# Patient Record
Sex: Female | Born: 1980 | Race: Black or African American | Hispanic: No | Marital: Single | Smoking: Current every day smoker
Health system: Southern US, Community
[De-identification: ages and names within clinical notes are randomized; demographics above are authoritative.]

## PROBLEM LIST (undated history)

## (undated) DIAGNOSIS — K219 Gastro-esophageal reflux disease without esophagitis: Secondary | ICD-10-CM

## (undated) DIAGNOSIS — D649 Anemia, unspecified: Secondary | ICD-10-CM

## (undated) DIAGNOSIS — N83209 Unspecified ovarian cyst, unspecified side: Secondary | ICD-10-CM

## (undated) DIAGNOSIS — E119 Type 2 diabetes mellitus without complications: Secondary | ICD-10-CM

## (undated) DIAGNOSIS — G8929 Other chronic pain: Secondary | ICD-10-CM

## (undated) DIAGNOSIS — I10 Essential (primary) hypertension: Secondary | ICD-10-CM

## (undated) DIAGNOSIS — G43909 Migraine, unspecified, not intractable, without status migrainosus: Secondary | ICD-10-CM

## (undated) DIAGNOSIS — J189 Pneumonia, unspecified organism: Secondary | ICD-10-CM

## (undated) DIAGNOSIS — G629 Polyneuropathy, unspecified: Secondary | ICD-10-CM

## (undated) HISTORY — DX: Polyneuropathy, unspecified: G62.9

## (undated) HISTORY — DX: Migraine, unspecified, not intractable, without status migrainosus: G43.909

---

## 2002-03-19 HISTORY — PX: CHOLECYSTECTOMY: SHX55

## 2010-10-19 HISTORY — PX: TUBAL LIGATION: SHX77

## 2014-12-01 DIAGNOSIS — J209 Acute bronchitis, unspecified: Secondary | ICD-10-CM | POA: Insufficient documentation

## 2017-02-18 ENCOUNTER — Other Ambulatory Visit: Payer: Self-pay

## 2017-02-18 ENCOUNTER — Encounter (HOSPITAL_COMMUNITY): Payer: Self-pay | Admitting: *Deleted

## 2017-02-18 ENCOUNTER — Emergency Department (HOSPITAL_COMMUNITY)
Admission: EM | Admit: 2017-02-18 | Discharge: 2017-02-18 | Disposition: A | Discharge status: Home / Self Care | Payer: Self-pay | Attending: Emergency Medicine | Admitting: Emergency Medicine

## 2017-02-18 ENCOUNTER — Emergency Department (HOSPITAL_COMMUNITY): Payer: Self-pay

## 2017-02-18 DIAGNOSIS — B9789 Other viral agents as the cause of diseases classified elsewhere: Secondary | ICD-10-CM | POA: Insufficient documentation

## 2017-02-18 DIAGNOSIS — E119 Type 2 diabetes mellitus without complications: Secondary | ICD-10-CM | POA: Insufficient documentation

## 2017-02-18 DIAGNOSIS — R0981 Nasal congestion: Secondary | ICD-10-CM | POA: Insufficient documentation

## 2017-02-18 DIAGNOSIS — J069 Acute upper respiratory infection, unspecified: Secondary | ICD-10-CM | POA: Insufficient documentation

## 2017-02-18 DIAGNOSIS — F17210 Nicotine dependence, cigarettes, uncomplicated: Secondary | ICD-10-CM | POA: Insufficient documentation

## 2017-02-18 DIAGNOSIS — J3489 Other specified disorders of nose and nasal sinuses: Secondary | ICD-10-CM | POA: Insufficient documentation

## 2017-02-18 HISTORY — DX: Type 2 diabetes mellitus without complications: E11.9

## 2017-02-18 LAB — RAPID STREP SCREEN (MED CTR MEBANE ONLY): Streptococcus, Group A Screen (Direct): NEGATIVE

## 2017-02-18 LAB — CBG MONITORING, ED: Glucose-Capillary: 280 mg/dL — ABNORMAL HIGH (ref 65–99)

## 2017-02-18 MED ORDER — BENZONATATE 100 MG PO CAPS: 100.0000 mg | ORAL_CAPSULE | Freq: Three times a day (TID) | ORAL | 0 refills | Status: DC

## 2017-02-18 MED ORDER — FLUTICASONE PROPIONATE 50 MCG/ACT NA SUSP: 2.0000 | Freq: Every day | NASAL | 0 refills | Status: DC

## 2017-02-18 MED ORDER — CETIRIZINE HCL 10 MG PO TABS: 10.0000 mg | ORAL_TABLET | Freq: Every day | ORAL | 0 refills | Status: DC

## 2017-02-18 MED ORDER — METFORMIN HCL 1000 MG PO TABS: 1000.0000 mg | ORAL_TABLET | Freq: Two times a day (BID) | ORAL | 0 refills | Status: DC

## 2017-02-18 NOTE — ED Notes (Signed)
[facility]

## 2017-02-18 NOTE — ED Provider Notes (Signed)
[facility] Provider Note   CSN: [id number] Arrival date & time: 02/18/17  0847     History   Chief Complaint Chief Complaint  Patient presents with  . Cough    HPI Melinda Stone is a 36 y.o. female with a history of diabetes who presents to the emergency department today for cough times 1 week.  Patient states that she has 6 children at home that currently her diagnosed with viral URIs.  Over the last 1 week patient has developed a dry, nonproductive cough with associated nasal congestion, rhinorrhea, sneezing, and sore throat.  She has been taking NyQuil once in the morning and once at night for this with only mild relief of her symptoms.   No temporality of the cough.  Patient denies any fever, chills, myalgias, arthralgias, night sweats, weight loss, travel, lower leg swelling, chest pain, shortness of breath, dyspnea on exertion, sputum production, hemoptysis. No inability to control secretions, N/V, abdominal pain, voice change, dental disease, or oral trauma.  Patient is a current smoker.  No ACE inhibitor use.  No new medications.  No relation to food.  No history of asthma/COPD/CHF.  HPI  Past Medical History:  Diagnosis Date  . Diabetes mellitus without complication (HCC)     There are no active problems to display for this patient.   Past Surgical History:  Procedure Laterality Date  . CESAREAN SECTION    . CHOLECYSTECTOMY      OB History    No data available       Home Medications    Prior to Admission medications   Not on File    Family History No family history on file.  Social History Social History   Tobacco Use  . Smoking status: Current Every Day Smoker  . Smokeless tobacco: Never Used  Substance Use Topics  . Alcohol use: No    Frequency: Never  . Drug use: No     Allergies   Patient has no known allergies.   Review of Systems Review of Systems  Constitutional: Negative for chills  and fever.  HENT: Positive for congestion, rhinorrhea and sneezing. Negative for ear pain, sinus pressure and sinus pain.   Respiratory: Positive for cough. Negative for chest tightness, shortness of breath and wheezing.   Cardiovascular: Negative for chest pain and leg swelling.  Gastrointestinal: Negative for abdominal pain, diarrhea, nausea and vomiting.  Musculoskeletal: Negative for arthralgias, myalgias and neck stiffness.  Skin: Negative for rash.  Neurological: Negative for syncope, numbness and headaches.  All other systems reviewed and are negative.    Physical Exam Updated Vital Signs BP 118/72 (BP Location: Right Arm)   Pulse 88   Temp 98 F (36.7 C) (Oral)   Resp 16   Ht 5\' 9"  (1.753 m)   Wt 81.2 kg (179 lb)   LMP 02/02/2017   SpO2 100%   BMI 26.43 kg/m   Physical Exam  Constitutional: She appears well-developed and well-nourished.  HENT:  Head: Normocephalic and atraumatic.  Right Ear: Tympanic membrane, external ear and ear canal normal.  Left Ear: Tympanic membrane, external ear and ear canal normal.  Nose: Mucosal edema and rhinorrhea present. Right sinus exhibits no maxillary sinus tenderness and no frontal sinus tenderness. Left sinus exhibits no maxillary sinus tenderness and no frontal sinus tenderness.  Mouth/Throat: Uvula is midline, oropharynx is clear and moist and mucous membranes are normal. No tonsillar exudate.  The patient has normal phonation and is in  control of secretions. No stridor.  Midline uvula without edema. Soft palate rises symmetrically.  No tonsillar erythema or exudates. No PTA. Tongue protrusion is normal. No trismus. No creptius on neck palpation and patient has good dentition. No gingival erythema or fluctuance noted. Mucus membranes moist.   Eyes: Pupils are equal, round, and reactive to light. Right eye exhibits no discharge. Left eye exhibits no discharge. No scleral icterus.  Neck: Trachea normal, normal range of motion and full  passive range of motion without pain. Neck supple. No spinous process tenderness present. No neck rigidity. Normal range of motion present.  No meningismus  Cardiovascular: Normal rate, regular rhythm and intact distal pulses.  No murmur heard. Pulses:      Radial pulses are 2+ on the right side, and 2+ on the left side.       Dorsalis pedis pulses are 2+ on the right side, and 2+ on the left side.       Posterior tibial pulses are 2+ on the right side, and 2+ on the left side.  No lower extremity swelling or edema. Calves symmetric in size bilaterally.  Pulmonary/Chest: Effort normal and breath sounds normal. She exhibits no tenderness.  Abdominal: Soft. Bowel sounds are normal. There is no tenderness. There is no rebound and no guarding.  Musculoskeletal: She exhibits no edema.  Lymphadenopathy:    She has no cervical adenopathy.  Neurological: She is alert.  Speech clear. Follows commands. No facial droop. PERRLA. EOM grossly intact. CN III-XII grossly intact. Grossly moves all extremities 4 without ataxia. Able and appropriate strength for age to upper and lower extremities bilaterally including grip strength.   Skin: Skin is warm and dry. No rash noted. She is not diaphoretic.  Psychiatric: She has a normal mood and affect.  Nursing note and vitals reviewed.    ED Treatments / Results  Labs (all labs ordered are listed, but only abnormal results are displayed) Labs Reviewed  CBG MONITORING, ED - Abnormal; Notable for the following components:      Result Value   Glucose-Capillary 280 (*)    All other components within normal limits  RAPID STREP SCREEN (NOT [facility])  CULTURE, GROUP A STREP [facility]    EKG  EKG Interpretation None       Radiology Dg Chest 2 View  Result Date: 02/18/2017 CLINICAL DATA:  Cough and sore throat for 1 week. EXAM: CHEST  2 VIEW COMPARISON:  None. FINDINGS: Lungs are clear. Heart size is normal. No pneumothorax or pleural fluid. No bony  abnormality. IMPRESSION: Normal chest. Electronically Signed   By: Drusilla Kannerhomas  Dalessio M.D.   On: 02/18/2017 10:27    Procedures Procedures (including critical care time)  Medications Ordered in ED Medications - No data to display   Initial Impression / Assessment and Plan / ED Course  I have reviewed the triage vital signs and the nursing notes.  Pertinent labs & imaging results that were available during my care of the patient were reviewed by me and considered in my medical decision making (see chart for details).     Pt CXR negative for acute infiltrate. Strep test done in triage negative. No concern for PTA or RPA. Patients symptoms are consistent with URI, likely viral etiology. Discussed that antibiotics are not indicated for viral infections. Pt will be discharged with symptomatic treatment.  Verbalizes understanding and is agreeable with plan. Pt is hemodynamically stable & in NAD prior to dc.  Patient with CBG done in triage.  Noted to be 280.  Patient notes she did not take her metformin this morning as she ran out yesterday.  She does not currently have a PCP does not have any refills.  Will provide refill of this medication encourage follow-up and establishing care with new PCP.  Patient is non-ill appearing.  She has no increased work of breathing, nausea, vomiting, diarrhea, abdominal pain, or urinary changes.  No focal neurologic deficits.  She noted that she ate breakfast just prior to arriving this morning.    At time of discharge. Patient now telling nurse she has chest pain when she is coughing. Says pain occurs when she has cough and immediatly resolves. Denies DOE, SOB, chest tightness or pressure, radiation to left arm, jaw or back, nausea, or diaphoresis. EKG reassuring. No pleuritic chest pain. Patient is PERC negative. Patients vital signs are reassuring. Suspect related to URI.  Final Clinical Impressions(s) / ED Diagnoses   Final diagnoses:  Viral URI with cough     ED Discharge Orders        Ordered    metFORMIN (GLUCOPHAGE) 1000 MG tablet  2 times daily with meals     02/18/17 1207    fluticasone (FLONASE) 50 MCG/ACT nasal spray  Daily     02/18/17 1207    cetirizine (ZYRTEC) 10 MG tablet  Daily     02/18/17 1207    benzonatate (TESSALON) 100 MG capsule  Every 8 hours     02/18/17 1207       Princella Pellegrini 02/18/17 1254    Azalia Bilis, MD 02/18/17 1555

## 2017-02-18 NOTE — ED Triage Notes (Addendum)
PT states "chest cold" times 1 week.  Nonproductive cough and sore throat. VS stable.

## 2017-02-18 NOTE — ED Notes (Signed)
Edp aware patient complaining of chest pain

## 2017-02-18 NOTE — Discharge Instructions (Addendum)
Please read and follow all provided instructions.  Your diagnoses today include:  1. Viral URI with cough     Tests performed today include: Vital signs. See below for your results today.  Chest xray - negative  Strep test - negative   Medications prescribed/advised:  1. Musinex [Guaifenesin] as a decongestant [thin mucus - you have to be well hydrated when taking this for it to work] - you can find this over the counter.  2. Tylenol for fever/pain and Motrin/Ibuprofen for muscle aches 3. Flonase Steroid Nasal Spray. This does not work to maximum capability unless used daily >1-2 weeks.  4. Allegra or Zyrtec: This medication is an allergy medication that may aid in helping to relieve your symptoms. Please take daily and discuss with your PCP if you should remain on this medication at follow up. If you were prescribed a allergy medication (allegra) please take daily.  5. Cough Suppressant: Tessalon - take as directed.   Home care instructions:  An upper respiratory infection (URI) is also sometimes known as the common cold. Most people improve within 1 week, but symptoms can last up to 2 weeks. A residual cough may last even longer.   URI is most commonly caused by a virus. Viruses are NOT treated with antibiotics. You can easily spread the virus to others by oral contact. This includes kissing, sharing a glass, coughing, or sneezing. Touching your mouth or nose and then touching a surface, which is then touched by another person, can also spread the virus.   TREATMENT  Treatment is directed at relieving symptoms. There is no cure. Antibiotics are not effective, because the infection is caused by a virus, not by bacteria. Treatment may include:  Increased fluid intake. Sports drinks offer valuable electrolytes, sugars, and fluids.  Breathing heated mist or steam (vaporizer or shower).  Eating chicken soup or other clear broths, and maintaining good nutrition.  Getting plenty of rest.   Using gargles or lozenges for comfort.  Controlling fevers with ibuprofen or acetaminophen as directed by your caregiver.  Increasing usage of your inhaler if you have asthma.  Return to work when your temperature has returned to normal.   Follow-up instructions: Followup with your primary care doctor in 4 days if your symptoms persist.  Your more than welcome to return to the emergency department if symptoms worsen or become concerning.  Return instructions:  Please return to the Emergency Department if you do not get better, if you get worse, or new symptoms OR  - Fever (temperature greater than 101.9F)  - Bleeding that does not stop with holding pressure to the area    -Severe pain (please note that you may be more sore the day after your accident)  - Chest Pain  - Difficulty breathing (worsening shortness of breath with sputum production may  be a sign of pneumonia.   - Severe nausea or vomiting  - Inability to tolerate food and liquids  - Passing out  - Skin becoming red around your wounds  - Change in mental status (confusion or lethargy)  - New numbness or weakness     -You develop fever, swollen neck glands, pain with swallowing or white areas on  the back of your throat. This may be a sign of strep throat.  Please return if you have any other emergent concerns.  Additional Information:  Your vital signs today were: BP 118/72 (BP Location: Right Arm)    Pulse 88    Temp 98 F (  36.7 C) (Oral)    Resp 16    Ht 5\' 9"  (1.753 m)    Wt 81.2 kg (179 lb)    LMP 02/02/2017    SpO2 100%    BMI 26.43 kg/m  If your blood pressure (BP) was elevated above 135/85 this visit, please have this repeated by your doctor within one month.

## 2017-02-20 LAB — CULTURE, GROUP A STREP (THRC)

## 2017-05-04 ENCOUNTER — Emergency Department (HOSPITAL_COMMUNITY)
Admission: EM | Admit: 2017-05-04 | Discharge: 2017-05-04 | Disposition: A | Discharge status: Home / Self Care | Payer: Self-pay | Attending: Emergency Medicine | Admitting: Emergency Medicine

## 2017-05-04 ENCOUNTER — Emergency Department (HOSPITAL_BASED_OUTPATIENT_CLINIC_OR_DEPARTMENT_OTHER)
Admit: 2017-05-04 | Discharge: 2017-05-04 | Disposition: A | Discharge status: Home / Self Care | Payer: Self-pay | Attending: Emergency Medicine | Admitting: Emergency Medicine

## 2017-05-04 ENCOUNTER — Other Ambulatory Visit: Payer: Self-pay

## 2017-05-04 ENCOUNTER — Encounter (HOSPITAL_COMMUNITY): Payer: Self-pay | Admitting: *Deleted

## 2017-05-04 DIAGNOSIS — Z7984 Long term (current) use of oral hypoglycemic drugs: Secondary | ICD-10-CM | POA: Insufficient documentation

## 2017-05-04 DIAGNOSIS — M79609 Pain in unspecified limb: Secondary | ICD-10-CM

## 2017-05-04 DIAGNOSIS — R739 Hyperglycemia, unspecified: Secondary | ICD-10-CM

## 2017-05-04 DIAGNOSIS — M7989 Other specified soft tissue disorders: Secondary | ICD-10-CM

## 2017-05-04 DIAGNOSIS — Z79899 Other long term (current) drug therapy: Secondary | ICD-10-CM | POA: Insufficient documentation

## 2017-05-04 DIAGNOSIS — E1165 Type 2 diabetes mellitus with hyperglycemia: Secondary | ICD-10-CM | POA: Insufficient documentation

## 2017-05-04 DIAGNOSIS — M79604 Pain in right leg: Secondary | ICD-10-CM | POA: Insufficient documentation

## 2017-05-04 LAB — CBC WITH DIFFERENTIAL/PLATELET
Basophils Absolute: 0.1 10*3/uL (ref 0.0–0.1)
Basophils Relative: 1 %
Eosinophils Absolute: 0.1 10*3/uL (ref 0.0–0.7)
Eosinophils Relative: 1 %
HCT: 33.4 % — ABNORMAL LOW (ref 36.0–46.0)
Hemoglobin: 11.8 g/dL — ABNORMAL LOW (ref 12.0–15.0)
Lymphocytes Relative: 20 %
Lymphs Abs: 2.1 10*3/uL (ref 0.7–4.0)
MCH: 27.6 pg (ref 26.0–34.0)
MCHC: 35.3 g/dL (ref 30.0–36.0)
MCV: 78 fL (ref 78.0–100.0)
Monocytes Absolute: 0.7 10*3/uL (ref 0.1–1.0)
Monocytes Relative: 7 %
Neutro Abs: 7.3 10*3/uL (ref 1.7–7.7)
Neutrophils Relative %: 71 %
Platelets: 193 10*3/uL (ref 150–400)
RBC: 4.28 MIL/uL (ref 3.87–5.11)
RDW: 17.8 % — ABNORMAL HIGH (ref 11.5–15.5)
WBC: 10.3 10*3/uL (ref 4.0–10.5)

## 2017-05-04 LAB — COMPREHENSIVE METABOLIC PANEL
ALT: 11 U/L — ABNORMAL LOW (ref 14–54)
AST: 19 U/L (ref 15–41)
Albumin: 3.6 g/dL (ref 3.5–5.0)
Alkaline Phosphatase: 79 U/L (ref 38–126)
Anion gap: 9 (ref 5–15)
BUN: 10 mg/dL (ref 6–20)
CO2: 22 mmol/L (ref 22–32)
Calcium: 8.9 mg/dL (ref 8.9–10.3)
Chloride: 103 mmol/L (ref 101–111)
Creatinine, Ser: 0.6 mg/dL (ref 0.44–1.00)
GFR calc Af Amer: >60 mL/min (ref >60)
GFR calc non Af Amer: >60 mL/min (ref >60)
Glucose, Bld: 362 mg/dL — ABNORMAL HIGH (ref 65–99)
Potassium: 4.1 mmol/L (ref 3.5–5.1)
Sodium: 134 mmol/L — ABNORMAL LOW (ref 135–145)
Total Bilirubin: 2.4 mg/dL — ABNORMAL HIGH (ref 0.3–1.2)
Total Protein: 6.5 g/dL (ref 6.5–8.1)

## 2017-05-04 MED ORDER — METFORMIN HCL 1000 MG PO TABS: 1000.0000 mg | ORAL_TABLET | Freq: Two times a day (BID) | ORAL | 2 refills | Status: DC

## 2017-05-04 NOTE — ED Notes (Signed)
ED Provider at bedside. 

## 2017-05-04 NOTE — Progress Notes (Signed)
VASCULAR LAB PRELIMINARY  PRELIMINARY  PRELIMINARY  PRELIMINARY  Right lower extremity venous duplex completed.    Preliminary report:  There is no DVT or SVT noted in the right lower extremity.     Tenea Sens, RVT 05/04/2017, 1:19 PM

## 2017-05-04 NOTE — ED Notes (Signed)
Pt reports R leg pain x 2 weeks accompanied by R leg swelling. Pt reports "you can't see it, but it feels tight." Pt also reports generalized joint pain x 5 months. Pt reports she has tried naproxen with no relief. Pt A&Ox4. Ambulatory with steady gait.

## 2017-05-04 NOTE — Discharge Instructions (Signed)
[facility] for on going diabetes treatment and for recheck of labs. Avoid alcohol and tylenol

## 2017-05-04 NOTE — ED Notes (Signed)
Patient transported to Vascular 

## 2017-05-04 NOTE — ED Provider Notes (Signed)
[facility] EMERGENCY DEPARTMENT Provider Note   CSN: [id number] Arrival date & time: 05/04/17  1914     History   Chief Complaint Chief Complaint  Patient presents with  . Generalized Body Aches  . Leg Pain    HPI Melinda Stone is a 37 y.o. female.  The history is provided by the patient. No language interpreter was used.  Leg Pain   This is a new problem. The current episode started more than 1 week ago. The problem occurs constantly. The pain is present in the right lower leg. The quality of the pain is described as aching. The pain is moderate. Associated symptoms include stiffness and tingling. She has tried nothing for the symptoms. The treatment provided no relief. There has been no history of extremity trauma.   Pt is not taking diabetes medication.  Pt does not have a regular MD.    Past Medical History:  Diagnosis Date  . Diabetes mellitus without complication (HCC)     There are no active problems to display for this patient.   Past Surgical History:  Procedure Laterality Date  . CESAREAN SECTION    . CHOLECYSTECTOMY      OB History    No data available       Home Medications    Prior to Admission medications   Medication Sig Start Date End Date Taking? Authorizing Provider  metFORMIN (GLUCOPHAGE) 1000 MG tablet Take 1 tablet (1,000 mg total) by mouth 2 (two) times daily with a meal. 02/18/17  Yes Maczis, Elmer Sow, PA-C  benzonatate (TESSALON) 100 MG capsule Take 1 capsule (100 mg total) by mouth every 8 (eight) hours. 02/18/17   Maczis, Elmer Sow, PA-C  cetirizine (ZYRTEC) 10 MG tablet Take 1 tablet (10 mg total) by mouth daily. 02/18/17   Maczis, Elmer Sow, PA-C  fluticasone (FLONASE) 50 MCG/ACT nasal spray Place 2 sprays into both nostrils daily. 02/18/17   Maczis, Elmer Sow, PA-C    Family History History reviewed. No pertinent family history.  Social History Social History   Tobacco Use  . Smoking status: Current  Every Day Smoker  . Smokeless tobacco: Never Used  Substance Use Topics  . Alcohol use: No    Frequency: Never  . Drug use: No     Allergies   Patient has no known allergies.   Review of Systems Review of Systems  Musculoskeletal: Positive for stiffness.  Neurological: Positive for tingling.  All other systems reviewed and are negative.    Physical Exam Updated Vital Signs BP 114/73 (BP Location: Right Arm)   Pulse 87   Temp 97.8 F (36.6 C) (Oral)   Resp 18   LMP 04/27/2017   SpO2 100%   Physical Exam  Constitutional: She is oriented to person, place, and time. She appears well-developed and well-nourished.  HENT:  Head: Normocephalic.  Right Ear: External ear normal.  Left Ear: External ear normal.  Eyes: Conjunctivae and EOM are normal. Pupils are equal, round, and reactive to light.  Neck: Normal range of motion.  Cardiovascular: Normal rate and regular rhythm.  Pulmonary/Chest: Effort normal.  Abdominal: Soft. She exhibits no distension.  Musculoskeletal: Normal range of motion.  Neurological: She is alert and oriented to person, place, and time.  Skin: Skin is warm.  Psychiatric: She has a normal mood and affect.  Nursing note and vitals reviewed.    ED Treatments / Results  Labs (all labs ordered are listed, but only abnormal results are  displayed) Labs Reviewed  CBC WITH DIFFERENTIAL/PLATELET - Abnormal; Notable for the following components:      Result Value   Hemoglobin 11.8 (*)    HCT 33.4 (*)    RDW 17.8 (*)    All other components within normal limits  COMPREHENSIVE METABOLIC PANEL - Abnormal; Notable for the following components:   Sodium 134 (*)    Glucose, Bld 362 (*)    ALT 11 (*)    Total Bilirubin 2.4 (*)    All other components within normal limits    EKG  EKG Interpretation None       Radiology No results found.  Procedures Procedures (including critical care time)  Medications Ordered in ED Medications - No data  to display   Initial Impression / Assessment and Plan / ED Course  I have reviewed the triage vital signs and the nursing notes.  Pertinent labs & imaging results that were available during my care of the patient were reviewed by me and considered in my medical decision making (see chart for details).     MDM:  Pt has tender right calf and leg, no swelling,  Doppler ultrasound shows no evidence of DVT.  Pt has elevated bilirubin and elevated Glucose.   I will restart pt on metformin.  Pt is advised to contact wellness center to establish primary care.   Final Clinical Impressions(s) / ED Diagnoses   Final diagnoses:  Pain in extremity, unspecified extremity  Hyperglycemia    ED Discharge Orders        Ordered    metFORMIN (GLUCOPHAGE) 1000 MG tablet  2 times daily     05/04/17 1350    An After Visit Summary was printed and given to the patient.    Osie CheeksSofia, Cristin Szatkowski K, PA-C 05/04/17 1352    Loren RacerYelverton, David, MD 05/04/17 2150

## 2017-05-04 NOTE — ED Notes (Signed)
Pt returned from US

## 2017-05-04 NOTE — ED Triage Notes (Signed)
Pt reports pain all over and has pain to right lower leg. Denies injury. States it isn't swollen but it feels swollen. Ambulatory at triage.

## 2017-06-12 ENCOUNTER — Emergency Department (HOSPITAL_COMMUNITY)
Admission: EM | Admit: 2017-06-12 | Discharge: 2017-06-12 | Disposition: A | Discharge status: Home / Self Care | Payer: Self-pay | Attending: Emergency Medicine | Admitting: Emergency Medicine

## 2017-06-12 ENCOUNTER — Encounter (HOSPITAL_COMMUNITY): Payer: Self-pay | Admitting: *Deleted

## 2017-06-12 ENCOUNTER — Other Ambulatory Visit: Payer: Self-pay

## 2017-06-12 DIAGNOSIS — E119 Type 2 diabetes mellitus without complications: Secondary | ICD-10-CM | POA: Insufficient documentation

## 2017-06-12 DIAGNOSIS — K029 Dental caries, unspecified: Secondary | ICD-10-CM | POA: Insufficient documentation

## 2017-06-12 DIAGNOSIS — N730 Acute parametritis and pelvic cellulitis: Secondary | ICD-10-CM

## 2017-06-12 DIAGNOSIS — F17210 Nicotine dependence, cigarettes, uncomplicated: Secondary | ICD-10-CM | POA: Insufficient documentation

## 2017-06-12 DIAGNOSIS — Z79899 Other long term (current) drug therapy: Secondary | ICD-10-CM | POA: Insufficient documentation

## 2017-06-12 DIAGNOSIS — N739 Female pelvic inflammatory disease, unspecified: Secondary | ICD-10-CM | POA: Insufficient documentation

## 2017-06-12 DIAGNOSIS — B9689 Other specified bacterial agents as the cause of diseases classified elsewhere: Secondary | ICD-10-CM | POA: Insufficient documentation

## 2017-06-12 DIAGNOSIS — N760 Acute vaginitis: Secondary | ICD-10-CM | POA: Insufficient documentation

## 2017-06-12 DIAGNOSIS — Z7984 Long term (current) use of oral hypoglycemic drugs: Secondary | ICD-10-CM | POA: Insufficient documentation

## 2017-06-12 LAB — URINALYSIS, ROUTINE W REFLEX MICROSCOPIC
Bilirubin Urine: NEGATIVE
Glucose, UA: >=500 mg/dL — AB
Hgb urine dipstick: NEGATIVE
Ketones, ur: NEGATIVE mg/dL
Leukocytes, UA: NEGATIVE
Nitrite: POSITIVE — AB
Protein, ur: NEGATIVE mg/dL
Specific Gravity, Urine: 1.035 — ABNORMAL HIGH (ref 1.005–1.030)
pH: 5 (ref 5.0–8.0)

## 2017-06-12 LAB — WET PREP, GENITAL
Sperm: NONE SEEN
Trich, Wet Prep: NONE SEEN
Yeast Wet Prep HPF POC: NONE SEEN

## 2017-06-12 LAB — PREGNANCY, URINE: Preg Test, Ur: NEGATIVE

## 2017-06-12 MED ORDER — METRONIDAZOLE 500 MG PO TABS: 500.0000 mg | ORAL_TABLET | Freq: Two times a day (BID) | ORAL | 0 refills | Status: AC

## 2017-06-12 MED ORDER — NAPROXEN 500 MG PO TABS: 500.0000 mg | ORAL_TABLET | Freq: Two times a day (BID) | ORAL | 0 refills | Status: DC | PRN

## 2017-06-12 MED ORDER — AZITHROMYCIN 250 MG PO TABS
1000.0000 mg | ORAL_TABLET | Freq: Once | ORAL | Status: AC
  Administered 2017-06-12: 1000 mg via ORAL
  Filled 2017-06-12: qty 4

## 2017-06-12 MED ORDER — LIDOCAINE HCL (PF) 1 % IJ SOLN
INTRAMUSCULAR | Status: AC
  Administered 2017-06-12: 1 mL
  Filled 2017-06-12: qty 5

## 2017-06-12 MED ORDER — KETOROLAC TROMETHAMINE 30 MG/ML IJ SOLN: 30.0000 mg | Freq: Once | INTRAMUSCULAR | Status: DC

## 2017-06-12 MED ORDER — CEFTRIAXONE SODIUM 250 MG IJ SOLR
250.0000 mg | Freq: Once | INTRAMUSCULAR | Status: AC
  Administered 2017-06-12: 250 mg via INTRAMUSCULAR
  Filled 2017-06-12: qty 250

## 2017-06-12 MED ORDER — NAPROXEN 250 MG PO TABS
500.0000 mg | ORAL_TABLET | Freq: Once | ORAL | Status: AC
  Administered 2017-06-12: 500 mg via ORAL
  Filled 2017-06-12: qty 2

## 2017-06-12 MED ORDER — DOXYCYCLINE HYCLATE 100 MG PO CAPS: 100.0000 mg | ORAL_CAPSULE | Freq: Two times a day (BID) | ORAL | 0 refills | Status: AC

## 2017-06-12 NOTE — ED Triage Notes (Signed)
Pt c/o dental pain since Saturday and vaginal discharge. Denies dysuria

## 2017-06-12 NOTE — Discharge Instructions (Signed)
Please read the instructions below.  Talk with your primary care provider about any new medications.  Please schedule an appointment for follow up with your OBGYN or primary care.  Finish your antibiotics as prescribed until gone.  Flagyl/Metronidazole: Do not drink alcohol with this medication as it will cause vomiting.  Doxycycline: protect your skin while taking this, it can make your skin more sensitive to the sun. You will receive a call from the hospital if your test results come back positive. Avoid sexual activity until you know your test results. If your results come back positive, it is important that you inform all of your sexual partners.   You can take Naproxen up to 2 times per day with meals, as needed for pain. Schedule an appointment with a dentist, using the dental resource guide attached. Return to the ER for difficulty swallowing or breathing, fever, or new or worsening symptoms.

## 2017-06-12 NOTE — ED Provider Notes (Signed)
[facility] EMERGENCY DEPARTMENT Provider Note   CSN: [id number] Arrival date & time: 06/12/17  0217     History   Chief Complaint Chief Complaint  Patient presents with  . Dental Pain  . Vaginal Discharge    HPI Melinda Stone is a 37 y.o. female has medical history of type 2 diabetes, presenting to the ED with intermittent, worsening right lower dental pain, as well as a second complaint of vaginal discharge.  Patient states dental pain is located in her right lower front tooth as well as right lower posterior molar, described as throbbing, and worse with eating.  Dates she feels as though her front lower tooth is loose.  Reports she does not have a dentist in town, however is working on establishing Medicaid in order to have her teeth fixed.  She denies sore throat, difficulty breathing or swallowing, fever.  Does endorse smoking occasionally. Patient's second complaint is vaginal discharge that began a few days ago.  Patient states about a week ago she had sexual intercourse with a new female partner, and the condom broke.  She reports some associated vaginal itching, however denies abdominal pain, dysuria, urinary symptoms, or other complaints. LMP was the beginning of this month.  The history is provided by the patient.    Past Medical History:  Diagnosis Date  . Diabetes mellitus without complication (HCC)     There are no active problems to display for this patient.   Past Surgical History:  Procedure Laterality Date  . CESAREAN SECTION    . CHOLECYSTECTOMY       OB History   None      Home Medications    Prior to Admission medications   Medication Sig Start Date End Date Taking? Authorizing Provider  metFORMIN (GLUCOPHAGE) 1000 MG tablet Take 1 tablet (1,000 mg total) by mouth 2 (two) times daily. 05/04/17  Yes Cheron Schaumann K, PA-C  benzonatate (TESSALON) 100 MG capsule Take 1 capsule (100 mg total) by mouth every 8 (eight) hours.  02/18/17   Maczis, Elmer Sow, PA-C  cetirizine (ZYRTEC) 10 MG tablet Take 1 tablet (10 mg total) by mouth daily. 02/18/17   Maczis, Elmer Sow, PA-C  doxycycline (VIBRAMYCIN) 100 MG capsule Take 1 capsule (100 mg total) by mouth 2 (two) times daily for 14 days. 06/12/17 06/26/17  Breia Ocampo, [place] N, PA-C  fluticasone (FLONASE) 50 MCG/ACT nasal spray Place 2 sprays into both nostrils daily. 02/18/17   Maczis, Elmer Sow, PA-C  metroNIDAZOLE (FLAGYL) 500 MG tablet Take 1 tablet (500 mg total) by mouth 2 (two) times daily for 14 days. 06/12/17 06/26/17  Pheonix Wisby, [place] N, PA-C  naproxen (NAPROSYN) 500 MG tablet Take 1 tablet (500 mg total) by mouth 2 (two) times daily as needed for mild pain. 06/12/17   Annaya Bangert, [place] N, PA-C    Family History No family history on file.  Social History Social History   Tobacco Use  . Smoking status: Current Every Day Smoker  . Smokeless tobacco: Never Used  Substance Use Topics  . Alcohol use: Yes    Frequency: Never  . Drug use: Yes    Types: Marijuana     Allergies   Patient has no known allergies.   Review of Systems Review of Systems  Constitutional: Negative for chills and fever.  HENT: Positive for dental problem. Negative for sore throat, trouble swallowing and voice change.   Respiratory: Negative for stridor.   Gastrointestinal: Negative for abdominal pain.  Genitourinary:  Positive for vaginal discharge. Negative for dysuria, frequency, vaginal bleeding and vaginal pain.       Vaginal itching  All other systems reviewed and are negative.    Physical Exam Updated Vital Signs BP 115/85   Pulse 91   Temp 98.6 F (37 C) (Oral)   Resp 18   Ht 5\' 9"  (1.753 m)   Wt 78.5 kg (173 lb)   LMP 05/23/2017   SpO2 100%   BMI 25.55 kg/m   Physical Exam  Constitutional: She appears well-developed and well-nourished. No distress.  Well-appearing  HENT:  Head: Normocephalic and atraumatic.  Mouth/Throat: Uvula is midline. No trismus in the jaw.  Dental caries present. No uvula swelling.    Tolerating secretions. Poor dentition throughout. No sublingual edema or tenderness. No fluctuant abscess. No purulent drainage.  Eyes: Conjunctivae are normal.  Neck: Normal range of motion. Neck supple.  Cardiovascular: Normal rate.  Pulmonary/Chest: Effort normal.  Abdominal: Soft. Bowel sounds are normal. She exhibits no distension. There is no tenderness. There is no rebound and no guarding.  Genitourinary: Uterus normal. There is no rash, tenderness or lesion on the right labia. There is no rash, tenderness or lesion on the left labia. Cervix exhibits motion tenderness and friability. Right adnexum displays no mass and no tenderness. Left adnexum displays no mass and no tenderness. There is erythema in the vagina. No tenderness in the vagina. Vaginal discharge found.  Genitourinary Comments: Exam performed with female chaperone present. CMT tenderness on exam. Cervix is friable, with malodorous white discharge.  Lymphadenopathy:    She has no cervical adenopathy.  Neurological: She is alert.  Skin: Skin is warm.  Psychiatric: She has a normal mood and affect. Her behavior is normal.  Nursing note and vitals reviewed.    ED Treatments / Results  Labs (all labs ordered are listed, but only abnormal results are displayed) Labs Reviewed  WET PREP, GENITAL - Abnormal; Notable for the following components:      Result Value   Clue Cells Wet Prep HPF POC PRESENT (*)    WBC, Wet Prep HPF POC FEW (*)    All other components within normal limits  URINALYSIS, ROUTINE W REFLEX MICROSCOPIC - Abnormal; Notable for the following components:   Specific Gravity, Urine 1.035 (*)    Glucose, UA >=500 (*)    Nitrite POSITIVE (*)    Bacteria, UA RARE (*)    Squamous Epithelial / LPF 0-5 (*)    All other components within normal limits  URINE CULTURE  PREGNANCY, URINE  HIV ANTIBODY (ROUTINE TESTING)  RPR  POC URINE PREG, ED  GC/CHLAMYDIA PROBE AMP  ([place]) NOT AT [facility]    EKG None  Radiology No results found.  Procedures Procedures (including critical care time)  Medications Ordered in ED Medications  cefTRIAXone (ROCEPHIN) injection 250 mg (250 mg Intramuscular Given 06/12/17 1005)  azithromycin (ZITHROMAX) tablet 1,000 mg (1,000 mg Oral Given 06/12/17 1005)  naproxen (NAPROSYN) tablet 500 mg (500 mg Oral Given 06/12/17 1005)  lidocaine (PF) (XYLOCAINE) 1 % injection (1 mL  Given 06/12/17 1005)     Initial Impression / Assessment and Plan / ED Course  I have reviewed the triage vital signs and the nursing notes.  Pertinent labs & imaging results that were available during my care of the patient were reviewed by me and considered in my medical decision making (see chart for details).     Pt w STD exposure, vaginal discharge, as well as dental  pain. VSS, afebrile, tolerating secretions.  No gross dental abscess. Exam unconcerning for peritonsillar abscess, Ludwig's angina or spread of infection. Exam with mild CMT tenderness, with friable cervix and malodorous discharge. No adnexal tenderness or fullness. U/A neg for infection. Urine preg neg. Due to pt's history, pelvic exam, and wet prep with increased WBCs, pt has been treated prophylactically with Azithromycin and Rocephin, and will be discharged with Doxycycline and flagyl. Patient to be discharged with instructions to follow up with OBGYN. Discussed importance of using protection when sexually active. Pt understands that they have GC/Chlamydia cultures pending and that they will need to inform all sexual partners if results return positive. Pt also provided dental resource guide, and urged to follow-up with dentist.  Discussed results, findings, treatment and follow up. Patient advised of return precautions. Patient verbalized understanding and agreed with plan.  Final Clinical Impressions(s) / ED Diagnoses   Final diagnoses:  PID (acute pelvic inflammatory disease)    Bacterial vaginosis  Pain due to dental caries    ED Discharge Orders        Ordered    doxycycline (VIBRAMYCIN) 100 MG capsule  2 times daily     06/12/17 1208    metroNIDAZOLE (FLAGYL) 500 MG tablet  2 times daily     06/12/17 1208    naproxen (NAPROSYN) 500 MG tablet  2 times daily PRN     06/12/17 1208       Jeaninne Lodico, [place] N, PA-C 06/12/17 1221    Melene PlanFloyd, Dan, DO 06/12/17 1222

## 2017-06-13 LAB — RPR: RPR Ser Ql: NONREACTIVE

## 2017-06-13 LAB — HIV ANTIBODY (ROUTINE TESTING W REFLEX): HIV Screen 4th Generation wRfx: NONREACTIVE

## 2017-06-13 LAB — GC/CHLAMYDIA PROBE AMP (~~LOC~~) NOT AT ARMC
Chlamydia: NEGATIVE
Neisseria Gonorrhea: NEGATIVE

## 2017-06-14 LAB — URINE CULTURE: Culture: >=100000 — AB

## 2017-06-15 ENCOUNTER — Telehealth: Payer: Self-pay

## 2017-06-15 NOTE — Progress Notes (Signed)
ED Antimicrobial Stewardship Positive Culture Follow Up   Melinda Stone is an 37 y.o. female who presented to [facility] on 06/12/2017 with a chief complaint of lower dental pain and vaginal discharge. No abdominal pain, dysuria or urinary symptoms.  Chief Complaint  Patient presents with  . Dental Pain  . Vaginal Discharge    Recent Results (from the past 720 hour(s))  Wet prep, genital     Status: Abnormal   Collection Time: 06/12/17  9:02 AM  Result Value Ref Range Status   Yeast Wet Prep HPF POC NONE SEEN NONE SEEN Final   Trich, Wet Prep NONE SEEN NONE SEEN Final   Clue Cells Wet Prep HPF POC PRESENT (A) NONE SEEN Final   WBC, Wet Prep HPF POC FEW (A) NONE SEEN Final   Sperm NONE SEEN  Final    Comment: Performed at [facility], [address] [place], [place] [address]  Urine culture     Status: Abnormal   Collection Time: 06/12/17  9:56 AM  Result Value Ref Range Status   Specimen Description URINE, CLEAN CATCH  Final   Special Requests   Final    NONE Performed at [facility], [address] [place], [place] [address]    Culture >=100,000 COLONIES/mL ESCHERICHIA COLI (A)  Final   Report Status 06/14/2017 FINAL  Final   Organism ID, Bacteria ESCHERICHIA COLI (A)  Final      Susceptibility   Escherichia coli - MIC*    AMPICILLIN >=32 RESISTANT Resistant     CEFAZOLIN <=4 SENSITIVE Sensitive     CEFTRIAXONE <=1 SENSITIVE Sensitive     CIPROFLOXACIN 0.5 SENSITIVE Sensitive     GENTAMICIN >=16 RESISTANT Resistant     IMIPENEM <=0.25 SENSITIVE Sensitive     NITROFURANTOIN <=16 SENSITIVE Sensitive     TRIMETH/SULFA >=320 RESISTANT Resistant     AMPICILLIN/SULBACTAM >=32 RESISTANT Resistant     PIP/TAZO <=4 SENSITIVE Sensitive     Extended ESBL NEGATIVE Sensitive     * >=100,000 COLONIES/mL ESCHERICHIA COLI    Patient was not discharged with any antibiotics for a UTI. This is likely asymptomatic bacteriuria. She was sent home of doxycycline and  metronidazole for a positive wet prep. Patient placement informed to call patient and inquire about symptoms. If feeling better, no need for further antibiotic treatment.   New antibiotic prescription: None   ED Provider: [place] Robinson, PA-C   Vinnie LevelBenjamin Michaelyn Wall, PharmD., BCPS Clinical Pharmacist

## 2017-06-15 NOTE — Telephone Encounter (Signed)
Called for symptom check from ED visit 06/12/17 per [place] Robinson PA-C  Pt states no further problems  Reminded to f/u with GYN and or return if needed

## 2017-08-25 ENCOUNTER — Other Ambulatory Visit: Payer: Self-pay

## 2017-08-25 ENCOUNTER — Emergency Department (HOSPITAL_COMMUNITY)
Admission: EM | Admit: 2017-08-25 | Discharge: 2017-08-25 | Disposition: A | Discharge status: Home / Self Care | Payer: Self-pay | Attending: Emergency Medicine | Admitting: Emergency Medicine

## 2017-08-25 ENCOUNTER — Encounter (HOSPITAL_COMMUNITY): Payer: Self-pay | Admitting: *Deleted

## 2017-08-25 DIAGNOSIS — D649 Anemia, unspecified: Secondary | ICD-10-CM | POA: Insufficient documentation

## 2017-08-25 DIAGNOSIS — L089 Local infection of the skin and subcutaneous tissue, unspecified: Secondary | ICD-10-CM

## 2017-08-25 DIAGNOSIS — Z79899 Other long term (current) drug therapy: Secondary | ICD-10-CM | POA: Insufficient documentation

## 2017-08-25 DIAGNOSIS — E119 Type 2 diabetes mellitus without complications: Secondary | ICD-10-CM | POA: Insufficient documentation

## 2017-08-25 DIAGNOSIS — Z7984 Long term (current) use of oral hypoglycemic drugs: Secondary | ICD-10-CM | POA: Insufficient documentation

## 2017-08-25 DIAGNOSIS — N907 Vulvar cyst: Secondary | ICD-10-CM | POA: Insufficient documentation

## 2017-08-25 DIAGNOSIS — F17210 Nicotine dependence, cigarettes, uncomplicated: Secondary | ICD-10-CM | POA: Insufficient documentation

## 2017-08-25 DIAGNOSIS — L723 Sebaceous cyst: Secondary | ICD-10-CM

## 2017-08-25 LAB — CBG MONITORING, ED: Glucose-Capillary: 305 mg/dL — ABNORMAL HIGH (ref 65–99)

## 2017-08-25 LAB — I-STAT CHEM 8, ED
BUN: 7 mg/dL (ref 6–20)
Calcium, Ion: 1.22 mmol/L (ref 1.15–1.40)
Chloride: 100 mmol/L — ABNORMAL LOW (ref 101–111)
Creatinine, Ser: 0.4 mg/dL — ABNORMAL LOW (ref 0.44–1.00)
Glucose, Bld: 310 mg/dL — ABNORMAL HIGH (ref 65–99)
HCT: 24 % — ABNORMAL LOW (ref 36.0–46.0)
Hemoglobin: 8.2 g/dL — ABNORMAL LOW (ref 12.0–15.0)
Potassium: 3.7 mmol/L (ref 3.5–5.1)
Sodium: 136 mmol/L (ref 135–145)
TCO2: 24 mmol/L (ref 22–32)

## 2017-08-25 MED ORDER — LIDOCAINE HCL 2 % IJ SOLN
20.0000 mL | Freq: Once | INTRAMUSCULAR | Status: AC
  Administered 2017-08-25: 400 mg
  Filled 2017-08-25: qty 20

## 2017-08-25 MED ORDER — METFORMIN HCL 1000 MG PO TABS: 1000.0000 mg | ORAL_TABLET | Freq: Two times a day (BID) | ORAL | 0 refills | Status: DC

## 2017-08-25 MED ORDER — IBUPROFEN 400 MG PO TABS
600.0000 mg | ORAL_TABLET | Freq: Once | ORAL | Status: AC
  Administered 2017-08-25: 600 mg via ORAL
  Filled 2017-08-25: qty 1

## 2017-08-25 MED ORDER — DOXYCYCLINE HYCLATE 100 MG PO CAPS: 100.0000 mg | ORAL_CAPSULE | Freq: Two times a day (BID) | ORAL | 0 refills | Status: AC

## 2017-08-25 NOTE — Discharge Instructions (Addendum)
Thank you for allowing me to provide your care today in the emergency department.  Download the app [employer] on your smartphone to see if there are any coupons available for these medications.  I have attached a coupon that is good for [employer] for your antibiotic, doxycycline.  Your hemoglobin was 8.3 today.  This was much lower than when you had it checked in February.  Please call 1 of the clinics above to get established with a primary care provider to have a CBC rechecked within the next 1 to 2 weeks.  If you develop shortness of breath with activity, extreme fatigue, pale color to the skin, these can be symptoms of anemia.  Anemia is frequently associated with heavy periods and women.  Starting today, take 1 tablet of doxycycline 2 times daily for the next 7 days.   Please return to the emergency department or go to urgent care for a wound recheck in 2 to 3 days.   Keeping tight control of your blood sugar will help your infection to heal faster.   To care for your wound at home:  Apply warm compresses to the area for 15 to 20 minutes up to 3-4 times a day.  Clean the area at least once daily with warm soap and water.  Then, apply a topical antibiotic, such as bacitracin before placing a gauze dressing over the area.  The wound should continue to drain for the next few days, this is normal.  Take 600 mg of ibuprofen or 650 mg of Tylenol every 6 hours for pain control.  Return to the emergency department if you develop fever, chills, if your symptoms significantly worsen after you have been on antibiotics for 48 hours, if your blood sugar becomes very high, if you develop severe pain with peeing or pooping, or other new, concerning symptoms.

## 2017-08-25 NOTE — ED Triage Notes (Signed)
The pt is c/o rt groin pain from an abscess in her groin for 4 days.no temp  Hx of the same

## 2017-08-25 NOTE — ED Provider Notes (Signed)
[facility] EMERGENCY DEPARTMENT Provider Note   CSN: [id number] Arrival date & time: 08/25/17  0150     History   Chief Complaint Chief Complaint  Patient presents with  . Abscess    HPI Melinda Stone is a 37 y.o. female with a h/o of DM Type II who presents to the emergency department with a chief complaint of abscess.  The patient reports constant, significantly worsening swelling to the right labia over the last 4 days.  She denies fever, chills, drainage from the area, N/V, vaginal discharge or bleeding, pain or difficulty with voiding or defecating, or abdominal pain.  Pain is significantly worse with sitting and improved with laying flat.  No treatment prior to arrival.  She has a history of diabetes.  She reports that her sugars have been running between 2-300 because she has been out of her metformin for the last few days.  She reports that she currently does not have medical insurance, but should be getting insurance this week at work.  The history is provided by the patient. No language interpreter was used.  Abscess  Associated symptoms: no fever     Past Medical History:  Diagnosis Date  . Diabetes mellitus without complication (HCC)     There are no active problems to display for this patient.   Past Surgical History:  Procedure Laterality Date  . CESAREAN SECTION    . CHOLECYSTECTOMY       OB History   None      Home Medications    Prior to Admission medications   Medication Sig Start Date End Date Taking? Authorizing Provider  benzonatate (TESSALON) 100 MG capsule Take 1 capsule (100 mg total) by mouth every 8 (eight) hours. 02/18/17   Maczis, Elmer Sow, PA-C  cetirizine (ZYRTEC) 10 MG tablet Take 1 tablet (10 mg total) by mouth daily. 02/18/17   Maczis, Elmer Sow, PA-C  doxycycline (VIBRAMYCIN) 100 MG capsule Take 1 capsule (100 mg total) by mouth 2 (two) times daily for 7 days. 08/25/17 09/01/17  Aerilynn Goin A, PA-C    fluticasone (FLONASE) 50 MCG/ACT nasal spray Place 2 sprays into both nostrils daily. 02/18/17   Maczis, Elmer Sow, PA-C  metFORMIN (GLUCOPHAGE) 1000 MG tablet Take 1 tablet (1,000 mg total) by mouth 2 (two) times daily. 08/25/17 09/24/17  Venkat Ankney A, PA-C  naproxen (NAPROSYN) 500 MG tablet Take 1 tablet (500 mg total) by mouth 2 (two) times daily as needed for mild pain. 06/12/17   Robinson, [place] N, PA-C    Family History No family history on file.  Social History Social History   Tobacco Use  . Smoking status: Current Every Day Smoker  . Smokeless tobacco: Never Used  Substance Use Topics  . Alcohol use: Yes    Frequency: Never  . Drug use: Yes    Types: Marijuana     Allergies   Patient has no known allergies.   Review of Systems Review of Systems  Constitutional: Negative for activity change, chills and fever.  Respiratory: Negative for shortness of breath.   Cardiovascular: Negative for chest pain.  Gastrointestinal: Negative for abdominal pain.  Musculoskeletal: Negative for back pain.  Skin: Positive for color change and wound. Negative for rash.  Allergic/Immunologic: Positive for immunocompromised state.   Physical Exam Updated Vital Signs BP 106/77   Pulse 80   Temp 98.3 F (36.8 C) (Oral)   Resp 16   Ht 5\' 9"  (1.753 m)   Wt  81.6 kg (180 lb)   SpO2 100%   BMI 26.58 kg/m   Physical Exam  Constitutional: No distress.  HENT:  Head: Normocephalic.  Eyes: Conjunctivae are normal.  Neck: Neck supple.  Cardiovascular: Normal rate, regular rhythm, normal heart sounds and intact distal pulses. Exam reveals no gallop and no friction rub.  No murmur heard. Pulmonary/Chest: Effort normal. No stridor. No respiratory distress. She has no wheezes. She has no rales. She exhibits no tenderness.  Abdominal: Soft. Bowel sounds are normal. She exhibits no distension and no mass. There is no tenderness. There is no rebound and no guarding. No hernia.  Genitourinary:  No vaginal discharge found.  Genitourinary Comments: Induration and fluctuance noted in the 10-11 o'clock area to the right vulva.  Minimal surrounding warmth and edema.  No active drainage.  No drainage able to be expressed.  The area is exquisitely tender to palpation, but the remaining vulva is nontender.  No swelling to the vaginal wall.  Chaperoned exam.  Musculoskeletal: She exhibits no tenderness.  Neurological: She is alert.  Skin: Skin is warm. No rash noted.  Psychiatric: Her behavior is normal.  Nursing note and vitals reviewed.   ED Treatments / Results  Labs (all labs ordered are listed, but only abnormal results are displayed) Labs Reviewed  CBG MONITORING, ED - Abnormal; Notable for the following components:      Result Value   Glucose-Capillary 305 (*)    All other components within normal limits  I-STAT CHEM 8, ED - Abnormal; Notable for the following components:   Chloride 100 (*)    Creatinine, Ser 0.40 (*)    Glucose, Bld 310 (*)    Hemoglobin 8.2 (*)    HCT 24.0 (*)    All other components within normal limits    EKG None  Radiology No results found.  Procedures .Marland Kitchen.Incision and Drainage Date/Time: 08/25/2017 8:36 AM Performed by: Barkley BoardsMcDonald, Alexsis Kathman A, PA-C Authorized by: Barkley BoardsMcDonald, Jontavious Commons A, PA-C   Consent:    Consent obtained:  Verbal   Consent given by:  Patient   Risks discussed:  Bleeding, pain and infection   Alternatives discussed:  No treatment Location:    Indications for incision and drainage: infected cyst.   Size:  3   Location:  Anogenital Pre-procedure details:    Skin preparation:  Antiseptic wash Anesthesia (see MAR for exact dosages):    Anesthesia method:  Local infiltration   Local anesthetic:  Lidocaine 2% w/o epi Procedure type:    Complexity:  Simple Procedure details:    Needle aspiration: no     Incision types:  Single straight   Incision depth:  Dermal   Scalpel blade:  10   Wound management:  Probed and deloculated and  irrigated with saline   Drainage:  Bloody and purulent   Drainage amount:  Moderate   Wound treatment:  Wound left open   Packing materials:  None Post-procedure details:    Patient tolerance of procedure:  Tolerated well, no immediate complications   (including critical care time)  EMERGENCY DEPARTMENT US SOFT TISSUE INTERPRETATION "Study: Limited Soft Tissue Ultrasound"  INDICATIONS: Pain and Soft tissue infection Multiple views of the body part were obtained in real-time with a multi-frequency linear probe  PERFORMED BY: Myself IMAGES ARCHIVED?: Yes SIDE:Right  BODY PART:Right vulva INTERPRETATION:  Rounded, well defined homogenous fluid collection without loculations.  Medications Ordered in ED Medications  ibuprofen (ADVIL,MOTRIN) tablet 600 mg (600 mg Oral Given 08/25/17 0730)  lidocaine (XYLOCAINE)  2 % (with pres) injection 400 mg (400 mg Infiltration Given 08/25/17 0731)     Initial Impression / Assessment and Plan / ED Course  I have reviewed the triage vital signs and the nursing notes.  Pertinent labs & imaging results that were available during my care of the patient were reviewed by me and considered in my medical decision making (see chart for details).     37 year old female with a history of DM Type II resenting with induration and fluctuance to the right vulva for the last 4 days.  No constitutional symptoms.  Bedside ultrasound with homogenous, well-defined fluid collection concerning for abscess versus infected cyst.  Doubt necrotizing fasciitis.  The patient was seen and evaluated along with Dr. Madilyn Hook, attending physician.  POC CBG 305.  The patient and I had a lengthy discussion regarding good control of her blood sugar and wound healing.  She is requesting a refill of her home metformin since she has been out for the last few days.  I-STAT Chem-8 with hemoglobin of 8.2, down from 11.8 in 2/19.  This is likely secondary to heavy  menstrual cycles.  She denies  fatigue, pallor, dyspnea, headedness, or syncope.  I have provided her with referrals to get established with primary care to have her CBC rechecked in the coming weeks.  Patient with skin abscess amenable to incision and drainage.  Abscess was not large enough to warrant packing or drain,  wound recheck in 2 days. Encouraged home warm soaks and flushing. No signs of cellulitis is surrounding skin.  Will d/c to home with doxycycline and refill of metformin.  This chart was dictated using voice recognition software. Despite best efforts to proofread, errors can occur which can change the documentation meaning.  Final Clinical Impressions(s) / ED Diagnoses   Final diagnoses:  Vulvar cyst  Infected sebaceous cyst of skin  Anemia, unspecified type    ED Discharge Orders        Ordered    metFORMIN (GLUCOPHAGE) 1000 MG tablet  2 times daily     08/25/17 0839    doxycycline (VIBRAMYCIN) 100 MG capsule  2 times daily     08/25/17 0839       Frederik Pear A, PA-C 08/25/17 1610    Tilden Fossa, MD 08/27/17 [id number]

## 2018-01-17 ENCOUNTER — Other Ambulatory Visit: Payer: Self-pay

## 2018-01-17 ENCOUNTER — Emergency Department (HOSPITAL_COMMUNITY)
Admission: EM | Admit: 2018-01-17 | Discharge: 2018-01-18 | Disposition: A | Discharge status: Home / Self Care | Payer: Self-pay | Attending: Emergency Medicine | Admitting: Emergency Medicine

## 2018-01-17 ENCOUNTER — Encounter (HOSPITAL_COMMUNITY): Payer: Self-pay | Admitting: Emergency Medicine

## 2018-01-17 DIAGNOSIS — E1165 Type 2 diabetes mellitus with hyperglycemia: Secondary | ICD-10-CM | POA: Insufficient documentation

## 2018-01-17 DIAGNOSIS — N3000 Acute cystitis without hematuria: Secondary | ICD-10-CM | POA: Insufficient documentation

## 2018-01-17 DIAGNOSIS — R739 Hyperglycemia, unspecified: Secondary | ICD-10-CM

## 2018-01-17 DIAGNOSIS — F17210 Nicotine dependence, cigarettes, uncomplicated: Secondary | ICD-10-CM | POA: Insufficient documentation

## 2018-01-17 DIAGNOSIS — R1031 Right lower quadrant pain: Secondary | ICD-10-CM | POA: Insufficient documentation

## 2018-01-17 HISTORY — DX: Unspecified ovarian cyst, unspecified side: N83.209

## 2018-01-17 LAB — URINALYSIS, ROUTINE W REFLEX MICROSCOPIC
Bilirubin Urine: NEGATIVE
Glucose, UA: >=500 mg/dL — AB
Hgb urine dipstick: NEGATIVE
Ketones, ur: NEGATIVE mg/dL
Leukocytes, UA: NEGATIVE
Nitrite: POSITIVE — AB
Protein, ur: NEGATIVE mg/dL
Specific Gravity, Urine: 1.029 (ref 1.005–1.030)
pH: 6 (ref 5.0–8.0)

## 2018-01-17 LAB — I-STAT BETA HCG BLOOD, ED (MC, WL, AP ONLY): I-stat hCG, quantitative: <5 m[IU]/mL (ref <5)

## 2018-01-17 NOTE — ED Triage Notes (Signed)
C/o RLQ and epigastric pain x 3 days with nausea.  Denies vomiting and diarrhea.  States it takes longer to urinate but denies pain with urination.  Pain similar to ovarian cyst pain that she had before.

## 2018-01-18 ENCOUNTER — Emergency Department (HOSPITAL_COMMUNITY): Payer: Self-pay

## 2018-01-18 LAB — COMPREHENSIVE METABOLIC PANEL
ALT: 19 U/L (ref 0–44)
AST: 18 U/L (ref 15–41)
Albumin: 3.8 g/dL (ref 3.5–5.0)
Alkaline Phosphatase: 76 U/L (ref 38–126)
Anion gap: 4 — ABNORMAL LOW (ref 5–15)
BUN: 11 mg/dL (ref 6–20)
CO2: 27 mmol/L (ref 22–32)
Calcium: 8.8 mg/dL — ABNORMAL LOW (ref 8.9–10.3)
Chloride: 101 mmol/L (ref 98–111)
Creatinine, Ser: 0.65 mg/dL (ref 0.44–1.00)
GFR calc Af Amer: >60 mL/min (ref >60)
GFR calc non Af Amer: >60 mL/min (ref >60)
Glucose, Bld: 392 mg/dL — ABNORMAL HIGH (ref 70–99)
Potassium: 3.7 mmol/L (ref 3.5–5.1)
Sodium: 132 mmol/L — ABNORMAL LOW (ref 135–145)
Total Bilirubin: 2.7 mg/dL — ABNORMAL HIGH (ref 0.3–1.2)
Total Protein: 6.8 g/dL (ref 6.5–8.1)

## 2018-01-18 LAB — CBC
HCT: 31.6 % — ABNORMAL LOW (ref 36.0–46.0)
Hemoglobin: 10.6 g/dL — ABNORMAL LOW (ref 12.0–15.0)
MCH: 26.4 pg (ref 26.0–34.0)
MCHC: 33.5 g/dL (ref 30.0–36.0)
MCV: 78.8 fL — ABNORMAL LOW (ref 80.0–100.0)
Platelets: 229 10*3/uL (ref 150–400)
RBC: 4.01 MIL/uL (ref 3.87–5.11)
RDW: 18.5 % — ABNORMAL HIGH (ref 11.5–15.5)
WBC: 9.6 10*3/uL (ref 4.0–10.5)
nRBC: 0 % (ref 0.0–0.2)

## 2018-01-18 LAB — WET PREP, GENITAL
Sperm: NONE SEEN
Trich, Wet Prep: NONE SEEN
Yeast Wet Prep HPF POC: NONE SEEN

## 2018-01-18 LAB — LIPASE, BLOOD: Lipase: 43 U/L (ref 11–51)

## 2018-01-18 MED ORDER — SODIUM CHLORIDE 0.9 % IV SOLN
1.0000 g | Freq: Once | INTRAVENOUS | Status: AC
  Administered 2018-01-18: 1 g via INTRAVENOUS
  Filled 2018-01-18: qty 10

## 2018-01-18 MED ORDER — METFORMIN HCL 1000 MG PO TABS: 1000.0000 mg | ORAL_TABLET | Freq: Two times a day (BID) | ORAL | 3 refills | Status: DC

## 2018-01-18 MED ORDER — IOHEXOL 300 MG/ML  SOLN
100.0000 mL | Freq: Once | INTRAMUSCULAR | Status: AC | PRN
  Administered 2018-01-18: 100 mL via INTRAVENOUS

## 2018-01-18 MED ORDER — CEPHALEXIN 500 MG PO CAPS: 500.0000 mg | ORAL_CAPSULE | Freq: Two times a day (BID) | ORAL | 0 refills | Status: DC

## 2018-01-18 MED ORDER — FLUCONAZOLE 150 MG PO TABS: 150.0000 mg | ORAL_TABLET | Freq: Once | ORAL | 2 refills | Status: AC

## 2018-01-18 MED ORDER — METRONIDAZOLE 500 MG PO TABS: 500.0000 mg | ORAL_TABLET | Freq: Two times a day (BID) | ORAL | 0 refills | Status: DC

## 2018-01-18 NOTE — ED Notes (Signed)
Patient transported to CT 

## 2018-01-18 NOTE — ED Provider Notes (Addendum)
[facility] EMERGENCY DEPARTMENT Provider Note   CSN: [id number] Arrival date & time: 01/17/18  2214     History   Chief Complaint Chief Complaint  Patient presents with  . Abdominal Pain    HPI Melinda Stone is a 37 y.o. female.  Patient presents to the emergency department with complaints of abdominal pain.  Patient experiencing right lower quadrant abdominal pain for 3 days.  Pain has been constant, sharp in nature.  She reports that it feels similar to problems that she has had with ovarian cysts in the past.  She reports that she has had persistent nausea secondary to the pain, which has caused epigastric discomfort, mostly nausea but no vomiting.  Patient denies constipation, diarrhea.  She has noticed that it "takes longer" to urinate but she has not had dysuria or frequency.     Past Medical History:  Diagnosis Date  . Diabetes mellitus without complication (HCC)   . Ovarian cyst     There are no active problems to display for this patient.   Past Surgical History:  Procedure Laterality Date  . CESAREAN SECTION    . CHOLECYSTECTOMY       OB History   None      Home Medications    Prior to Admission medications   Medication Sig Start Date End Date Taking? Authorizing Provider  metFORMIN (GLUCOPHAGE) 1000 MG tablet Take 1 tablet (1,000 mg total) by mouth 2 (two) times daily. Patient not taking: Reported on 01/18/2018 08/25/17 01/18/26  Barkley Boards, PA-C    Family History No family history on file.  Social History Social History   Tobacco Use  . Smoking status: Current Every Day Smoker  . Smokeless tobacco: Never Used  Substance Use Topics  . Alcohol use: Yes    Frequency: Never  . Drug use: Yes    Types: Marijuana     Allergies   Patient has no known allergies.   Review of Systems Review of Systems  Gastrointestinal: Positive for abdominal pain and nausea.  All other systems reviewed and are  negative.    Physical Exam Updated Vital Signs BP 109/68   Pulse 91   Temp 98.4 F (36.9 C) (Oral)   Resp 16   Ht 5\' 9"  (1.753 m)   Wt 78.5 kg   LMP 01/06/2018   SpO2 100%   BMI 25.55 kg/m   Physical Exam  Constitutional: She is oriented to person, place, and time. She appears well-developed and well-nourished. No distress.  HENT:  Head: Normocephalic and atraumatic.  Right Ear: Hearing normal.  Left Ear: Hearing normal.  Nose: Nose normal.  Mouth/Throat: Oropharynx is clear and moist and mucous membranes are normal.  Eyes: Pupils are equal, round, and reactive to light. Conjunctivae and EOM are normal.  Neck: Normal range of motion. Neck supple.  Cardiovascular: Regular rhythm, S1 normal and S2 normal. Exam reveals no gallop and no friction rub.  No murmur heard. Pulmonary/Chest: Effort normal and breath sounds normal. No respiratory distress. She exhibits no tenderness.  Abdominal: Soft. Normal appearance and bowel sounds are normal. There is no hepatosplenomegaly. There is tenderness in the right lower quadrant. There is no rebound, no guarding, no tenderness at McBurney's point and negative Murphy's sign. No hernia.  Genitourinary: Uterus normal. Cervix exhibits no motion tenderness, no discharge and no friability.  Musculoskeletal: Normal range of motion.  Neurological: She is alert and oriented to person, place, and time. She has normal strength. No  cranial nerve deficit or sensory deficit. Coordination normal. GCS eye subscore is 4. GCS verbal subscore is 5. GCS motor subscore is 6.  Skin: Skin is warm, dry and intact. No rash noted. No cyanosis.  Psychiatric: She has a normal mood and affect. Her speech is normal and behavior is normal. Thought content normal.  Nursing note and vitals reviewed.    ED Treatments / Results  Labs (all labs ordered are listed, but only abnormal results are displayed) Labs Reviewed  WET PREP, GENITAL - Abnormal; Notable for the  following components:      Result Value   Clue Cells Wet Prep HPF POC PRESENT (*)    WBC, Wet Prep HPF POC MANY (*)    All other components within normal limits  COMPREHENSIVE METABOLIC PANEL - Abnormal; Notable for the following components:   Sodium 132 (*)    Glucose, Bld 392 (*)    Calcium 8.8 (*)    Total Bilirubin 2.7 (*)    Anion gap 4 (*)    All other components within normal limits  CBC - Abnormal; Notable for the following components:   Hemoglobin 10.6 (*)    HCT 31.6 (*)    MCV 78.8 (*)    RDW 18.5 (*)    All other components within normal limits  URINALYSIS, ROUTINE W REFLEX MICROSCOPIC - Abnormal; Notable for the following components:   Glucose, UA >=500 (*)    Nitrite POSITIVE (*)    Bacteria, UA MANY (*)    All other components within normal limits  URINE CULTURE  LIPASE, BLOOD  I-STAT BETA HCG BLOOD, ED ([facility], [facility], [facility] ONLY)  GC/CHLAMYDIA PROBE AMP ([facility]) NOT AT [facility]    EKG None  Radiology Ct Abdomen Pelvis W Contrast  Result Date: 01/18/2018 CLINICAL DATA:  Abdominal pain.  Evaluate for appendicitis. EXAM: CT ABDOMEN AND PELVIS WITH CONTRAST TECHNIQUE: Multidetector CT imaging of the abdomen and pelvis was performed using the standard protocol following bolus administration of intravenous contrast. CONTRAST:  OMNIPAQUE IOHEXOL 300 MG/ML  SOLN COMPARISON:  10/11/2017 FINDINGS: Lower chest: No acute abnormality. Hepatobiliary: Low-density structure within posterior dome of liver measures 1.4 cm, image 13/3. Unchanged from previous exam but technically indeterminate. Previous cholecystectomy. No biliary dilatation. Pancreas: Unremarkable. No pancreatic ductal dilatation or surrounding inflammatory changes. Spleen: Normal in size without focal abnormality. Adrenals/Urinary Tract: Normal adrenal glands. No kidney mass or hydronephrosis identified. The urinary bladder appears within normal limits. Stomach/Bowel: Stomach normal. The small bowel loops have a normal  course and caliber without obstruction. The appendix is visualized and appears normal. Unremarkable appearance of the colon. Vascular/Lymphatic: Normal appearance of the abdominal aorta. No enlarged retroperitoneal or mesenteric adenopathy. No enlarged pelvic or inguinal lymph nodes. Reproductive: The uterus and adnexal structures appear unremarkable. Dominant follicle in the right ovary measures 2.3 cm. Other: There is no free fluid or fluid collections within the abdomen or pelvis. Musculoskeletal: No acute or significant osseous findings. IMPRESSION: 1. No acute findings within the abdomen or pelvis. 2. Unchanged low-attenuation structure within the posterior right lobe of liver which is technically indeterminate. Follow-up imaging with nonemergent liver protocol MRI is recommended. Electronically Signed   By: Signa Kell M.D.   On: 01/18/2018 02:34    Procedures Procedures (including critical care time)  Medications Ordered in ED Medications  cefTRIAXone (ROCEPHIN) 1 g in sodium chloride 0.9 % 100 mL IVPB (1 g Intravenous New Bag/Given 01/18/18 0405)  iohexol (OMNIPAQUE) 300 MG/ML solution 100 mL (100  mLs Intravenous Contrast Given 01/18/18 0138)     Initial Impression / Assessment and Plan / ED Course  I have reviewed the triage vital signs and the nursing notes.  Pertinent labs & imaging results that were available during my care of the patient were reviewed by me and considered in my medical decision making (see chart for details).     Patient presents to the emergency department for evaluation of abdominal pain.  Patient reports previous history of ovarian cyst with similar pain.  Pelvic exam did not reveal any fullness or masses, no cervical motion tenderness.  CT scan did not show any acute pathology.  Blood work was normal, urinalysis suggest infection.  Review of her records reveals previous urinalysis with similar findings had a culture that was positive for E. coli.  Will treat for  UTI, follow-up as needed.  Blood sugar elevated today.  Patient has been off of her metformin for at least 2 weeks.  She reports that she ran out.  Will restart.  Final Clinical Impressions(s) / ED Diagnoses   Final diagnoses:  Right lower quadrant abdominal pain  Acute cystitis without hematuria  Hyperglycemia    ED Discharge Orders    None       Gilda Crease, MD 01/18/18 1610    Gilda Crease, MD 01/18/18 [phone]

## 2018-01-20 LAB — GC/CHLAMYDIA PROBE AMP (~~LOC~~) NOT AT ARMC
Chlamydia: NEGATIVE
Neisseria Gonorrhea: NEGATIVE

## 2018-01-20 LAB — URINE CULTURE: Culture: >=100000 — AB

## 2018-01-21 ENCOUNTER — Telehealth: Payer: Self-pay | Admitting: Emergency Medicine

## 2018-01-21 NOTE — Telephone Encounter (Signed)
Post ED Visit - Positive Culture Follow-up  Culture report reviewed by antimicrobial stewardship pharmacist:  []  Enzo Bi, Pharm.D. []  Celedonio Miyamoto, Pharm.D., BCPS AQ-ID []  Garvin Fila, Pharm.D., BCPS []  Georgina Pillion, Pharm.D., BCPS []  [place], [address].D., BCPS, AAHIVP []  Estella Husk, Pharm.D., BCPS, AAHIVP []  Lysle Pearl, PharmD, BCPS []  Phillips Climes, PharmD, BCPS []  Agapito Games, PharmD, BCPS []  Verlan Friends, PharmD Michel Harrow PharmD  Positive urine culture Treated with cephalexin and metronidazole and fluconazole, organism sensitive to the same and no further patient follow-up is required at this time.  Berle Mull 01/21/2018, 3:47 PM

## 2018-05-15 ENCOUNTER — Other Ambulatory Visit: Payer: Self-pay

## 2018-05-15 ENCOUNTER — Encounter: Payer: Self-pay | Admitting: Emergency Medicine

## 2018-05-15 ENCOUNTER — Ambulatory Visit: Payer: PRIVATE HEALTH INSURANCE | Admitting: Emergency Medicine

## 2018-05-15 VITALS — BP 93/58 | HR 91 | Temp 98.4°F | Resp 16 | Ht 69.0 in | Wt 169.4 lb

## 2018-05-15 DIAGNOSIS — E1165 Type 2 diabetes mellitus with hyperglycemia: Secondary | ICD-10-CM | POA: Diagnosis not present

## 2018-05-15 LAB — POCT URINALYSIS DIP (MANUAL ENTRY)
Bilirubin, UA: NEGATIVE
Blood, UA: NEGATIVE
Glucose, UA: 500 mg/dL — AB
Ketones, POC UA: NEGATIVE mg/dL
Leukocytes, UA: NEGATIVE
Nitrite, UA: POSITIVE — AB
Protein Ur, POC: NEGATIVE mg/dL
Spec Grav, UA: 1.02 (ref 1.010–1.025)
Urobilinogen, UA: 4 E.U./dL — AB
pH, UA: 6 (ref 5.0–8.0)

## 2018-05-15 LAB — POCT GLYCOSYLATED HEMOGLOBIN (HGB A1C): Hemoglobin A1C: 7 % — AB (ref 4.0–5.6)

## 2018-05-15 LAB — GLUCOSE, POCT (MANUAL RESULT ENTRY): POC Glucose: 425 mg/dl — AB (ref 70–99)

## 2018-05-15 MED ORDER — GLIPIZIDE 5 MG PO TABS: 5.0000 mg | ORAL_TABLET | Freq: Every day | ORAL | 1 refills | Status: DC

## 2018-05-15 MED ORDER — METFORMIN HCL 500 MG PO TABS: 500.0000 mg | ORAL_TABLET | Freq: Two times a day (BID) | ORAL | 3 refills | Status: DC

## 2018-05-15 NOTE — Assessment & Plan Note (Signed)
Uncontrolled diabetes.  Will start metformin 500 mg twice a day and glipizide 5 mg once a day with diabetic nutrition.  Follow-up in 3 months.

## 2018-05-15 NOTE — Progress Notes (Signed)
Melinda Stone 38 y.o.   Chief Complaint  Patient presents with  . Establish Care  . Diabetes    per patient since she was 38 years old    HISTORY OF PRESENT ILLNESS: This is a 38 y.o. female with history of diabetes since age 60, first visit to this office, here to establish care.  Has not seen a doctor in the past 3 years.  Taking medications erratically and only intermittently.  Takes metformin occasionally.  Complaining of general weakness.  No other significant symptoms.  HPI   Prior to Admission medications   Medication Sig Start Date End Date Taking? Authorizing Provider  metroNIDAZOLE (FLAGYL) 500 MG tablet Take 1 tablet (500 mg total) by mouth 2 (two) times daily. One po bid x 7 days 01/18/18  Yes Pollina, Canary Brim, MD  cephALEXin (KEFLEX) 500 MG capsule Take 1 capsule (500 mg total) by mouth 2 (two) times daily. Patient not taking: Reported on 05/15/2018 01/18/18   Gilda Crease, MD  metFORMIN (GLUCOPHAGE) 1000 MG tablet Take 1 tablet (1,000 mg total) by mouth 2 (two) times daily. 01/18/18 02/17/18  Gilda Crease, MD    No Known Allergies  There are no active problems to display for this patient.   Past Medical History:  Diagnosis Date  . Diabetes mellitus without complication (HCC)   . Ovarian cyst     Past Surgical History:  Procedure Laterality Date  . CESAREAN SECTION    . CHOLECYSTECTOMY      Social History   Socioeconomic History  . Marital status: Single    Spouse name: Not on file  . Number of children: Not on file  . Years of education: Not on file  . Highest education level: Not on file  Occupational History  . Not on file  Social Needs  . Financial resource strain: Not on file  . Food insecurity:    Worry: Not on file    Inability: Not on file  . Transportation needs:    Medical: Not on file    Non-medical: Not on file  Tobacco Use  . Smoking status: Current Every Day Smoker  . Smokeless tobacco: Never Used    Substance and Sexual Activity  . Alcohol use: Yes    Frequency: Never  . Drug use: Yes    Types: Marijuana  . Sexual activity: Not on file  Lifestyle  . Physical activity:    Days per week: Not on file    Minutes per session: Not on file  . Stress: Not on file  Relationships  . Social connections:    Talks on phone: Not on file    Gets together: Not on file    Attends religious service: Not on file    Active member of club or organization: Not on file    Attends meetings of clubs or organizations: Not on file    Relationship status: Not on file  . Intimate partner violence:    Fear of current or ex partner: Not on file    Emotionally abused: Not on file    Physically abused: Not on file    Forced sexual activity: Not on file  Other Topics Concern  . Not on file  Social History Narrative  . Not on file    No family history on file.   Review of Systems  Constitutional: Positive for malaise/fatigue. Negative for chills and fever.  HENT: Negative.   Eyes: Negative.  Negative for blurred vision and double  vision.  Respiratory: Negative.  Negative for cough and shortness of breath.   Cardiovascular: Negative.  Negative for chest pain and palpitations.  Gastrointestinal: Negative.  Negative for abdominal pain, diarrhea, heartburn, nausea and vomiting.  Genitourinary: Negative.   Musculoskeletal: Negative.   Skin: Negative.  Negative for rash.  Neurological: Negative.  Negative for dizziness and headaches.  Endo/Heme/Allergies: Negative.   All other systems reviewed and are negative.    Vitals:   05/15/18 0831  BP: (!) 93/58  Pulse: 91  Resp: 16  Temp: 98.4 F (36.9 C)  SpO2: 100%     Physical Exam Vitals signs reviewed.  Constitutional:      Appearance: Normal appearance.  HENT:     Head: Normocephalic and atraumatic.     Nose: Nose normal.     Mouth/Throat:     Mouth: Mucous membranes are moist.     Pharynx: Oropharynx is clear.  Eyes:     Extraocular  Movements: Extraocular movements intact.     Conjunctiva/sclera: Conjunctivae normal.     Pupils: Pupils are equal, round, and reactive to light.  Neck:     Musculoskeletal: Normal range of motion and neck supple.  Cardiovascular:     Rate and Rhythm: Normal rate and regular rhythm.     Heart sounds: Normal heart sounds.  Pulmonary:     Effort: Pulmonary effort is normal.     Breath sounds: Normal breath sounds.  Abdominal:     Palpations: Abdomen is soft.     Tenderness: There is no abdominal tenderness.  Musculoskeletal: Normal range of motion.  Skin:    General: Skin is warm and dry.     Capillary Refill: Capillary refill takes less than 2 seconds.  Neurological:     General: No focal deficit present.     Mental Status: She is alert and oriented to person, place, and time.  Psychiatric:        Mood and Affect: Mood normal.        Behavior: Behavior normal.    Results for orders placed or performed in visit on 05/15/18 (from the past 24 hour(s))  POCT glucose (manual entry)     Status: Abnormal   Collection Time: 05/15/18  9:17 AM  Result Value Ref Range   POC Glucose 425 (A) 70 - 99 mg/dl  POCT urinalysis dipstick     Status: Abnormal   Collection Time: 05/15/18  9:20 AM  Result Value Ref Range   Color, UA yellow yellow   Clarity, UA clear clear   Glucose, UA =500 (A) negative mg/dL   Bilirubin, UA negative negative   Ketones, POC UA negative negative mg/dL   Spec Grav, UA [id number] [id number] - 1.025   Blood, UA negative negative   pH, UA 6.0 5.0 - 8.0   Protein Ur, POC negative negative mg/dL   Urobilinogen, UA 4.0 (A) 0.2 or 1.0 E.U./dL   Nitrite, UA Positive (A) Negative   Leukocytes, UA Negative Negative  POCT glycosylated hemoglobin (Hb A1C)     Status: Abnormal   Collection Time: 05/15/18  9:22 AM  Result Value Ref Range   Hemoglobin A1C 7.0 (A) 4.0 - 5.6 %   HbA1c POC (<> result, manual entry)     HbA1c, POC (prediabetic range)     HbA1c, POC (controlled diabetic  range)       ASSESSMENT & PLAN: Type 2 diabetes mellitus with hyperglycemia, without long-term current use of insulin (HCC) Uncontrolled diabetes.  Will start metformin 500  mg twice a day and glipizide 5 mg once a day with diabetic nutrition.  Follow-up in 3 months.  Marieanne was seen today for establish care and diabetes.  Diagnoses and all orders for this visit:  Type 2 diabetes mellitus with hyperglycemia, without long-term current use of insulin (HCC) -     POCT glucose (manual entry) -     POCT glycosylated hemoglobin (Hb A1C) -     POCT urinalysis dipstick -     CBC with Differential/Platelet -     Comprehensive metabolic panel -     Lipid panel -     TSH -     Microalbumin, urine -     metFORMIN (GLUCOPHAGE) 500 MG tablet; Take 1 tablet (500 mg total) by mouth 2 (two) times daily with a meal. -     glipiZIDE (GLUCOTROL) 5 MG tablet; Take 1 tablet (5 mg total) by mouth daily before breakfast. -     Urine Culture    Patient Instructions       If you have lab work done today you will be contacted with your lab results within the next 2 weeks.  If you have not heard from [place] then please contact us. The fastest way to get your results is to register for My Chart.   IF you received an x-ray today, you will receive an invoice from [facility]. Please contact [facility] at [phone] with questions or concerns regarding your invoice.   IF you received labwork today, you will receive an invoice from [facility]. Please contact [facility] at [phone] with questions or concerns regarding your invoice.   Our billing staff will not be able to assist you with questions regarding bills from these companies.  You will be contacted with the lab results as soon as they are available. The fastest way to get your results is to activate your My Chart account. Instructions are located on the last page of this paperwork. If you have not heard from [place] regarding the  results in 2 weeks, please contact this office.     Diabetes Mellitus and Nutrition, Adult When you have diabetes (diabetes mellitus), it is very important to have healthy eating habits because your blood sugar (glucose) levels are greatly affected by what you eat and drink. Eating healthy foods in the appropriate amounts, at about the same times every day, can help you:  Control your blood glucose.  Lower your risk of heart disease.  Improve your blood pressure.  Reach or maintain a healthy weight. Every person with diabetes is different, and each person has different needs for a meal plan. Your health care provider may recommend that you work with a diet and nutrition specialist (dietitian) to make a meal plan that is best for you. Your meal plan may vary depending on factors such as:  The calories you need.  The medicines you take.  Your weight.  Your blood glucose, blood pressure, and cholesterol levels.  Your activity level.  Other health conditions you have, such as heart or kidney disease. How do carbohydrates affect me? Carbohydrates, also called carbs, affect your blood glucose level more than any other type of food. Eating carbs naturally raises the amount of glucose in your blood. Carb counting is a method for keeping track of how many carbs you eat. Counting carbs is important to keep your blood glucose at a healthy level, especially if you use insulin or take certain oral diabetes medicines. It is important to know how  many carbs you can safely have in each meal. This is different for every person. Your dietitian can help you calculate how many carbs you should have at each meal and for each snack. Foods that contain carbs include:  Bread, cereal, rice, pasta, and crackers.  Potatoes and corn.  Peas, beans, and lentils.  Milk and yogurt.  Fruit and juice.  Desserts, such as cakes, cookies, ice cream, and candy. How does alcohol affect me? Alcohol can cause a  sudden decrease in blood glucose (hypoglycemia), especially if you use insulin or take certain oral diabetes medicines. Hypoglycemia can be a life-threatening condition. Symptoms of hypoglycemia (sleepiness, dizziness, and confusion) are similar to symptoms of having too much alcohol. If your health care provider says that alcohol is safe for you, follow these guidelines:  Limit alcohol intake to no more than 1 drink per day for nonpregnant women and 2 drinks per day for men. One drink equals 12 oz of beer, 5 oz of wine, or 1 oz of hard liquor.  Do not drink on an empty stomach.  Keep yourself hydrated with water, diet soda, or unsweetened iced tea.  Keep in mind that regular soda, juice, and other mixers may contain a lot of sugar and must be counted as carbs. What are tips for following this plan?  Reading food labels  Start by checking the serving size on the "Nutrition Facts" label of packaged foods and drinks. The amount of calories, carbs, fats, and other nutrients listed on the label is based on one serving of the item. Many items contain more than one serving per package.  Check the total grams (g) of carbs in one serving. You can calculate the number of servings of carbs in one serving by dividing the total carbs by 15. For example, if a food has 30 g of total carbs, it would be equal to 2 servings of carbs.  Check the number of grams (g) of saturated and trans fats in one serving. Choose foods that have low or no amount of these fats.  Check the number of milligrams (mg) of salt (sodium) in one serving. Most people should limit total sodium intake to less than 2,300 mg per day.  Always check the nutrition information of foods labeled as "low-fat" or "nonfat". These foods may be higher in added sugar or refined carbs and should be avoided.  Talk to your dietitian to identify your daily goals for nutrients listed on the label. Shopping  Avoid buying canned, premade, or processed  foods. These foods tend to be high in fat, sodium, and added sugar.  Shop around the outside edge of the grocery store. This includes fresh fruits and vegetables, bulk grains, fresh meats, and fresh dairy. Cooking  Use low-heat cooking methods, such as baking, instead of high-heat cooking methods like deep frying.  Cook using healthy oils, such as olive, canola, or sunflower oil.  Avoid cooking with butter, cream, or high-fat meats. Meal planning  Eat meals and snacks regularly, preferably at the same times every day. Avoid going long periods of time without eating.  Eat foods high in fiber, such as fresh fruits, vegetables, beans, and whole grains. Talk to your dietitian about how many servings of carbs you can eat at each meal.  Eat 4-6 ounces (oz) of lean protein each day, such as lean meat, chicken, fish, eggs, or tofu. One oz of lean protein is equal to: ? 1 oz of meat, chicken, or fish. ? 1 egg. ?  cup of tofu.  Eat some foods each day that contain healthy fats, such as avocado, nuts, seeds, and fish. Lifestyle  Check your blood glucose regularly.  Exercise regularly as told by your health care provider. This may include: ? 150 minutes of moderate-intensity or vigorous-intensity exercise each week. This could be brisk walking, biking, or water aerobics. ? Stretching and doing strength exercises, such as yoga or weightlifting, at least 2 times a week.  Take medicines as told by your health care provider.  Do not use any products that contain nicotine or tobacco, such as cigarettes and e-cigarettes. If you need help quitting, ask your health care provider.  Work with a Veterinary surgeon or diabetes educator to identify strategies to manage stress and any emotional and social challenges. Questions to ask a health care provider  Do I need to meet with a diabetes educator?  Do I need to meet with a dietitian?  What number can I call if I have questions?  When are the best times  to check my blood glucose? Where to find more information:  American Diabetes Association: diabetes.org  Academy of Nutrition and Dietetics: www.eatright.[employer] of Diabetes and Digestive and Kidney Diseases (NIH): CarFlippers.tn Summary  A healthy meal plan will help you control your blood glucose and maintain a healthy lifestyle.  Working with a diet and nutrition specialist (dietitian) can help you make a meal plan that is best for you.  Keep in mind that carbohydrates (carbs) and alcohol have immediate effects on your blood glucose levels. It is important to count carbs and to use alcohol carefully. This information is not intended to replace advice given to you by your health care provider. Make sure you discuss any questions you have with your health care provider. Document Released: 11/30/2004 Document Revised: 10/03/2016 Document Reviewed: 04/09/2016 Elsevier Interactive Patient Education  2019 Elsevier Inc.      Edwina Barth, MD [facility]

## 2018-05-15 NOTE — Patient Instructions (Addendum)
   If you have lab work done today you will be contacted with your lab results within the next 2 weeks.  If you have not heard from us then please contact us. The fastest way to get your results is to register for My Chart.   IF you received an x-ray today, you will receive an invoice from [facility]. Please contact [facility] at [phone] with questions or concerns regarding your invoice.   IF you received labwork today, you will receive an invoice from [facility]. Please contact [facility] at [phone] with questions or concerns regarding your invoice.   Our billing staff will not be able to assist you with questions regarding bills from these companies.  You will be contacted with the lab results as soon as they are available. The fastest way to get your results is to activate your My Chart account. Instructions are located on the last page of this paperwork. If you have not heard from us regarding the results in 2 weeks, please contact this office.     Diabetes Mellitus and Nutrition, Adult When you have diabetes (diabetes mellitus), it is very important to have healthy eating habits because your blood sugar (glucose) levels are greatly affected by what you eat and drink. Eating healthy foods in the appropriate amounts, at about the same times every day, can help you:  Control your blood glucose.  Lower your risk of heart disease.  Improve your blood pressure.  Reach or maintain a healthy weight. Every person with diabetes is different, and each person has different needs for a meal plan. Your health care provider may recommend that you work with a diet and nutrition specialist (dietitian) to make a meal plan that is best for you. Your meal plan may vary depending on factors such as:  The calories you need.  The medicines you take.  Your weight.  Your blood glucose, blood pressure, and cholesterol levels.  Your activity level.  Other health conditions  you have, such as heart or kidney disease. How do carbohydrates affect me? Carbohydrates, also called carbs, affect your blood glucose level more than any other type of food. Eating carbs naturally raises the amount of glucose in your blood. Carb counting is a method for keeping track of how many carbs you eat. Counting carbs is important to keep your blood glucose at a healthy level, especially if you use insulin or take certain oral diabetes medicines. It is important to know how many carbs you can safely have in each meal. This is different for every person. Your dietitian can help you calculate how many carbs you should have at each meal and for each snack. Foods that contain carbs include:  Bread, cereal, rice, pasta, and crackers.  Potatoes and corn.  Peas, beans, and lentils.  Milk and yogurt.  Fruit and juice.  Desserts, such as cakes, cookies, ice cream, and candy. How does alcohol affect me? Alcohol can cause a sudden decrease in blood glucose (hypoglycemia), especially if you use insulin or take certain oral diabetes medicines. Hypoglycemia can be a life-threatening condition. Symptoms of hypoglycemia (sleepiness, dizziness, and confusion) are similar to symptoms of having too much alcohol. If your health care provider says that alcohol is safe for you, follow these guidelines:  Limit alcohol intake to no more than 1 drink per day for nonpregnant women and 2 drinks per day for men. One drink equals 12 oz of beer, 5 oz of wine, or 1 oz of hard liquor.    Do not drink on an empty stomach.  Keep yourself hydrated with water, diet soda, or unsweetened iced tea.  Keep in mind that regular soda, juice, and other mixers may contain a lot of sugar and must be counted as carbs. What are tips for following this plan?  Reading food labels  Start by checking the serving size on the "Nutrition Facts" label of packaged foods and drinks. The amount of calories, carbs, fats, and other  nutrients listed on the label is based on one serving of the item. Many items contain more than one serving per package.  Check the total grams (g) of carbs in one serving. You can calculate the number of servings of carbs in one serving by dividing the total carbs by 15. For example, if a food has 30 g of total carbs, it would be equal to 2 servings of carbs.  Check the number of grams (g) of saturated and trans fats in one serving. Choose foods that have low or no amount of these fats.  Check the number of milligrams (mg) of salt (sodium) in one serving. Most people should limit total sodium intake to less than 2,300 mg per day.  Always check the nutrition information of foods labeled as "low-fat" or "nonfat". These foods may be higher in added sugar or refined carbs and should be avoided.  Talk to your dietitian to identify your daily goals for nutrients listed on the label. Shopping  Avoid buying canned, premade, or processed foods. These foods tend to be high in fat, sodium, and added sugar.  Shop around the outside edge of the grocery store. This includes fresh fruits and vegetables, bulk grains, fresh meats, and fresh dairy. Cooking  Use low-heat cooking methods, such as baking, instead of high-heat cooking methods like deep frying.  Cook using healthy oils, such as olive, canola, or sunflower oil.  Avoid cooking with butter, cream, or high-fat meats. Meal planning  Eat meals and snacks regularly, preferably at the same times every day. Avoid going long periods of time without eating.  Eat foods high in fiber, such as fresh fruits, vegetables, beans, and whole grains. Talk to your dietitian about how many servings of carbs you can eat at each meal.  Eat 4-6 ounces (oz) of lean protein each day, such as lean meat, chicken, fish, eggs, or tofu. One oz of lean protein is equal to: ? 1 oz of meat, chicken, or fish. ? 1 egg. ?  cup of tofu.  Eat some foods each day that contain  healthy fats, such as avocado, nuts, seeds, and fish. Lifestyle  Check your blood glucose regularly.  Exercise regularly as told by your health care provider. This may include: ? 150 minutes of moderate-intensity or vigorous-intensity exercise each week. This could be brisk walking, biking, or water aerobics. ? Stretching and doing strength exercises, such as yoga or weightlifting, at least 2 times a week.  Take medicines as told by your health care provider.  Do not use any products that contain nicotine or tobacco, such as cigarettes and e-cigarettes. If you need help quitting, ask your health care provider.  Work with a counselor or diabetes educator to identify strategies to manage stress and any emotional and social challenges. Questions to ask a health care provider  Do I need to meet with a diabetes educator?  Do I need to meet with a dietitian?  What number can I call if I have questions?  When are the best times to   check my blood glucose? Where to find more information:  American Diabetes Association: diabetes.org  Academy of Nutrition and Dietetics: www.eatright.org  National Institute of Diabetes and Digestive and Kidney Diseases (NIH): www.niddk.nih.gov Summary  A healthy meal plan will help you control your blood glucose and maintain a healthy lifestyle.  Working with a diet and nutrition specialist (dietitian) can help you make a meal plan that is best for you.  Keep in mind that carbohydrates (carbs) and alcohol have immediate effects on your blood glucose levels. It is important to count carbs and to use alcohol carefully. This information is not intended to replace advice given to you by your health care provider. Make sure you discuss any questions you have with your health care provider. Document Released: 11/30/2004 Document Revised: 10/03/2016 Document Reviewed: 04/09/2016 Elsevier Interactive Patient Education  2019 Elsevier Inc.  

## 2018-05-16 ENCOUNTER — Encounter: Payer: Self-pay | Admitting: *Deleted

## 2018-05-16 LAB — LIPID PANEL
Chol/HDL Ratio: 1.5 ratio (ref 0.0–4.4)
Cholesterol, Total: 120 mg/dL (ref 100–199)
HDL: 80 mg/dL (ref >39)
LDL Calculated: 28 mg/dL (ref 0–99)
Triglycerides: 58 mg/dL (ref 0–149)
VLDL Cholesterol Cal: 12 mg/dL (ref 5–40)

## 2018-05-16 LAB — CBC WITH DIFFERENTIAL/PLATELET
Basophils Absolute: 0.1 10*3/uL (ref 0.0–0.2)
Basos: 2 %
EOS (ABSOLUTE): 0.3 10*3/uL (ref 0.0–0.4)
Eos: 3 %
Hematocrit: 31.7 % — ABNORMAL LOW (ref 34.0–46.6)
Hemoglobin: 11 g/dL — ABNORMAL LOW (ref 11.1–15.9)
Immature Grans (Abs): 0 10*3/uL (ref 0.0–0.1)
Immature Granulocytes: 1 %
Lymphocytes Absolute: 2 10*3/uL (ref 0.7–3.1)
Lymphs: 23 %
MCH: 27.3 pg (ref 26.6–33.0)
MCHC: 34.7 g/dL (ref 31.5–35.7)
MCV: 79 fL (ref 79–97)
Monocytes Absolute: 0.6 10*3/uL (ref 0.1–0.9)
Monocytes: 7 %
Neutrophils Absolute: 5.7 10*3/uL (ref 1.4–7.0)
Neutrophils: 64 %
Platelets: 217 10*3/uL (ref 150–450)
RBC: 4.03 x10E6/uL (ref 3.77–5.28)
RDW: 16.5 % — ABNORMAL HIGH (ref 11.7–15.4)
WBC: 8.8 10*3/uL (ref 3.4–10.8)

## 2018-05-16 LAB — MICROALBUMIN, URINE: Microalbumin, Urine: 4.6 ug/mL

## 2018-05-16 LAB — COMPREHENSIVE METABOLIC PANEL
ALT: 9 IU/L (ref 0–32)
AST: 18 IU/L (ref 0–40)
Albumin/Globulin Ratio: 1.6 (ref 1.2–2.2)
Albumin: 4.2 g/dL (ref 3.8–4.8)
Alkaline Phosphatase: 84 IU/L (ref 39–117)
BUN/Creatinine Ratio: 17 (ref 9–23)
BUN: 11 mg/dL (ref 6–20)
Bilirubin Total: 1.5 mg/dL — ABNORMAL HIGH (ref 0.0–1.2)
CO2: 16 mmol/L — ABNORMAL LOW (ref 20–29)
Calcium: 8.7 mg/dL (ref 8.7–10.2)
Chloride: 101 mmol/L (ref 96–106)
Creatinine, Ser: 0.66 mg/dL (ref 0.57–1.00)
GFR calc Af Amer: 131 mL/min/{1.73_m2} (ref >59)
GFR calc non Af Amer: 114 mL/min/{1.73_m2} (ref >59)
Globulin, Total: 2.6 g/dL (ref 1.5–4.5)
Glucose: 395 mg/dL — ABNORMAL HIGH (ref 65–99)
Potassium: 4.3 mmol/L (ref 3.5–5.2)
Sodium: 132 mmol/L — ABNORMAL LOW (ref 134–144)
Total Protein: 6.8 g/dL (ref 6.0–8.5)

## 2018-05-16 LAB — TSH: TSH: 1.34 u[IU]/mL (ref 0.450–4.500)

## 2018-07-09 ENCOUNTER — Encounter (HOSPITAL_COMMUNITY): Payer: Self-pay

## 2018-07-09 ENCOUNTER — Emergency Department (HOSPITAL_COMMUNITY)
Admission: EM | Admit: 2018-07-09 | Discharge: 2018-07-09 | Disposition: A | Discharge status: Home / Self Care | Payer: PRIVATE HEALTH INSURANCE | Attending: Emergency Medicine | Admitting: Emergency Medicine

## 2018-07-09 ENCOUNTER — Other Ambulatory Visit: Payer: Self-pay

## 2018-07-09 DIAGNOSIS — Z7984 Long term (current) use of oral hypoglycemic drugs: Secondary | ICD-10-CM | POA: Insufficient documentation

## 2018-07-09 DIAGNOSIS — K0889 Other specified disorders of teeth and supporting structures: Secondary | ICD-10-CM | POA: Diagnosis not present

## 2018-07-09 DIAGNOSIS — F17200 Nicotine dependence, unspecified, uncomplicated: Secondary | ICD-10-CM | POA: Diagnosis not present

## 2018-07-09 DIAGNOSIS — E119 Type 2 diabetes mellitus without complications: Secondary | ICD-10-CM | POA: Insufficient documentation

## 2018-07-09 MED ORDER — PENICILLIN V POTASSIUM 250 MG PO TABS
500.0000 mg | ORAL_TABLET | Freq: Once | ORAL | Status: AC
  Administered 2018-07-09: 500 mg via ORAL
  Filled 2018-07-09: qty 2

## 2018-07-09 MED ORDER — CHLORHEXIDINE GLUCONATE 0.12 % MT SOLN: 15.0000 mL | Freq: Two times a day (BID) | OROMUCOSAL | 0 refills | Status: DC

## 2018-07-09 MED ORDER — PENICILLIN V POTASSIUM 500 MG PO TABS: 500.0000 mg | ORAL_TABLET | Freq: Four times a day (QID) | ORAL | 0 refills | Status: DC

## 2018-07-09 MED ORDER — FLUCONAZOLE 150 MG PO TABS: ORAL_TABLET | ORAL | 0 refills | Status: DC

## 2018-07-09 NOTE — ED Provider Notes (Signed)
[facility] EMERGENCY DEPARTMENT Provider Note   CSN: [id number] Arrival date & time: 07/09/18  2223    History   Chief Complaint Chief Complaint  Patient presents with  . Dental Pain    HPI Melinda Stone is a 38 y.o. female.  Patient presents to the emergency department with a dental complaint. Symptoms began 3 days ago. The patient has tried to alleviate pain with OTC meds.  Pain rated as severe, characterized as throbbing in nature and located lower incisors. Patient denies fever, night sweats, chills, difficulty swallowing or opening mouth, SOB, nuchal rigidity or decreased ROM of neck.  Patient does not have a dentist and requests a resource guide at discharge.      HPI  Past Medical History:  Diagnosis Date  . Diabetes mellitus without complication (HCC)   . Ovarian cyst     Patient Active Problem List   Diagnosis Date Noted  . Type 2 diabetes mellitus with hyperglycemia, without long-term current use of insulin (HCC) 05/15/2018    Past Surgical History:  Procedure Laterality Date  . CESAREAN SECTION    . CHOLECYSTECTOMY  2004  . TUBAL LIGATION  10/19/2010     OB History   No obstetric history on file.      Home Medications    Prior to Admission medications   Medication Sig Start Date End Date Taking? Authorizing Provider  cephALEXin (KEFLEX) 500 MG capsule Take 1 capsule (500 mg total) by mouth 2 (two) times daily. Patient not taking: Reported on 05/15/2018 01/18/18   Gilda Crease, MD  chlorhexidine (PERIDEX) 0.12 % solution Use as directed 15 mLs in the mouth or throat 2 (two) times daily. 07/09/18   Roxy Horseman, PA-C  fluconazole (DIFLUCAN) 150 MG tablet Take one tablet by mouth after completing your antibiotics. 07/09/18   Roxy Horseman, PA-C  glipiZIDE (GLUCOTROL) 5 MG tablet Take 1 tablet (5 mg total) by mouth daily before breakfast. 05/15/18 08/13/18  Georgina Quint, MD  metFORMIN (GLUCOPHAGE) 500 MG  tablet Take 1 tablet (500 mg total) by mouth 2 (two) times daily with a meal. 05/15/18   Sagardia, Eilleen Kempf, MD  metroNIDAZOLE (FLAGYL) 500 MG tablet Take 1 tablet (500 mg total) by mouth 2 (two) times daily. One po bid x 7 days 01/18/18   Gilda Crease, MD  penicillin v potassium (VEETID) 500 MG tablet Take 1 tablet (500 mg total) by mouth 4 (four) times daily. 07/09/18   Roxy Horseman, PA-C    Family History Family History  Problem Relation Age of Onset  . Diabetes Mother   . Hypertension Mother   . Stroke Mother     Social History Social History   Tobacco Use  . Smoking status: Current Every Day Smoker  . Smokeless tobacco: Never Used  Substance Use Topics  . Alcohol use: Yes    Frequency: Never  . Drug use: Yes    Types: Marijuana     Allergies   Patient has no known allergies.   Review of Systems Review of Systems   Physical Exam Updated Vital Signs BP 119/87 (BP Location: Right Arm)   Pulse (!) 117   Temp 98.9 F (37.2 C) (Oral)   Resp 16   Ht 5\' 9"  (1.753 m)   Wt 76.7 kg   SpO2 100%   BMI 24.96 kg/m   Physical Exam Physical Exam  Constitutional: Pt appears well-developed and well-nourished.  HENT:  Head: Normocephalic.  Right Ear: Tympanic membrane,  external ear and ear canal normal.  Left Ear: Tympanic membrane, external ear and ear canal normal.  Nose: Nose normal. Right sinus exhibits no maxillary sinus tenderness and no frontal sinus tenderness. Left sinus exhibits no maxillary sinus tenderness and no frontal sinus tenderness.  Mouth/Throat: Uvula is midline, oropharynx is clear and moist and mucous membranes are normal. No oral lesions. No uvula swelling or lacerations. No oropharyngeal exudate, posterior oropharyngeal edema, posterior oropharyngeal erythema or tonsillar abscesses.  Poor dentition No gingival swelling, fluctuance or induration No gross abscess  No sublingual edema, tenderness to palpation, or sign of Ludwig's angina,  or deep space infection Pain at lower incisors Eyes: Conjunctivae are normal. Pupils are equal, round, and reactive to light. Right eye exhibits no discharge. Left eye exhibits no discharge.  Neck: Normal range of motion. Neck supple.  No stridor Handling secretions without difficulty No nuchal rigidity No cervical lymphadenopathy Cardiovascular: Normal rate, regular rhythm and normal heart sounds.   Pulmonary/Chest: Effort normal. No respiratory distress.  Equal chest rise  Abdominal: Soft. Bowel sounds are normal. Pt exhibits no distension. There is no tenderness.  Lymphadenopathy: Pt has no cervical adenopathy.  Neurological: Pt is alert and oriented x 4  Skin: Skin is warm and dry.  Psychiatric: Pt has a normal mood and affect.  Nursing note and vitals reviewed.   ED Treatments / Results  Labs (all labs ordered are listed, but only abnormal results are displayed) Labs Reviewed - No data to display  EKG None  Radiology No results found.  Procedures Procedures (including critical care time)  Medications Ordered in ED Medications  penicillin v potassium (VEETID) tablet 500 mg (has no administration in time range)     Initial Impression / Assessment and Plan / ED Course  I have reviewed the triage vital signs and the nursing notes.  Pertinent labs & imaging results that were available during my care of the patient were reviewed by me and considered in my medical decision making (see chart for details).        Patient with dentalgia.  No abscess requiring immediate incision and drainage.  Exam not concerning for Ludwig's angina or pharyngeal abscess.  Will treat with penicillin and peridex. Pt instructed to follow-up with dentist.  Discussed return precautions. Pt safe for discharge.   Final Clinical Impressions(s) / ED Diagnoses   Final diagnoses:  Pain, dental    ED Discharge Orders         Ordered    penicillin v potassium (VEETID) 500 MG tablet  4 times  daily     07/09/18 2245    chlorhexidine (PERIDEX) 0.12 % solution  2 times daily     07/09/18 2245    fluconazole (DIFLUCAN) 150 MG tablet     07/09/18 2245           Roxy HorsemanBrowning, Courtnei Ruddell, PA-C 07/09/18 2247    Gerhard MunchLockwood, Perel Hauschild, MD 07/10/18 [phone]

## 2018-07-09 NOTE — ED Triage Notes (Signed)
Pt arrives POV for eval of dental pain. Pt reports onset of abscess x2-3 days to posterior lower frontal molars. Denies hx of same, states no relief w/ OTC meds.

## 2018-07-09 NOTE — ED Notes (Signed)
Discharge instructions and prescriptions discussed with Pt. Pt verbalized understanding. Pt stable and ambulatory.   

## 2018-08-04 ENCOUNTER — Encounter (HOSPITAL_COMMUNITY): Payer: Self-pay | Admitting: Emergency Medicine

## 2018-08-04 ENCOUNTER — Emergency Department (HOSPITAL_COMMUNITY)
Admission: EM | Admit: 2018-08-04 | Discharge: 2018-08-04 | Disposition: A | Discharge status: Home / Self Care | Payer: PRIVATE HEALTH INSURANCE | Attending: Emergency Medicine | Admitting: Emergency Medicine

## 2018-08-04 ENCOUNTER — Other Ambulatory Visit: Payer: Self-pay

## 2018-08-04 DIAGNOSIS — K0889 Other specified disorders of teeth and supporting structures: Secondary | ICD-10-CM

## 2018-08-04 DIAGNOSIS — K047 Periapical abscess without sinus: Secondary | ICD-10-CM | POA: Diagnosis not present

## 2018-08-04 DIAGNOSIS — F1210 Cannabis abuse, uncomplicated: Secondary | ICD-10-CM | POA: Diagnosis not present

## 2018-08-04 DIAGNOSIS — Z79899 Other long term (current) drug therapy: Secondary | ICD-10-CM | POA: Insufficient documentation

## 2018-08-04 DIAGNOSIS — E119 Type 2 diabetes mellitus without complications: Secondary | ICD-10-CM | POA: Insufficient documentation

## 2018-08-04 DIAGNOSIS — F17200 Nicotine dependence, unspecified, uncomplicated: Secondary | ICD-10-CM | POA: Diagnosis not present

## 2018-08-04 DIAGNOSIS — Z7984 Long term (current) use of oral hypoglycemic drugs: Secondary | ICD-10-CM | POA: Insufficient documentation

## 2018-08-04 MED ORDER — AMOXICILLIN-POT CLAVULANATE 875-125 MG PO TABS: 1.0000 | ORAL_TABLET | Freq: Two times a day (BID) | ORAL | 0 refills | Status: AC

## 2018-08-04 MED ORDER — ACETAMINOPHEN 500 MG PO TABS: 1000.0000 mg | ORAL_TABLET | Freq: Three times a day (TID) | ORAL | 0 refills | Status: DC | PRN

## 2018-08-04 MED ORDER — LIDOCAINE VISCOUS HCL 2 % MT SOLN
15.0000 mL | Freq: Once | OROMUCOSAL | Status: AC
  Administered 2018-08-04: 15 mL via OROMUCOSAL
  Filled 2018-08-04: qty 15

## 2018-08-04 MED ORDER — LIDOCAINE VISCOUS HCL 2 % MT SOLN: 15.0000 mL | OROMUCOSAL | 0 refills | Status: DC | PRN

## 2018-08-04 MED ORDER — FLUCONAZOLE 150 MG PO TABS: 150.0000 mg | ORAL_TABLET | Freq: Every day | ORAL | 0 refills | Status: AC

## 2018-08-04 NOTE — ED Provider Notes (Signed)
[facility] EMERGENCY DEPARTMENT Provider Note   CSN: [id number] Arrival date & time: 08/04/18  1755    History   Chief Complaint No chief complaint on file.   HPI Melinda Stone is a 38 y.o. female presenting for evaluation of dental pain.  Patient states over the past week, she has had increased dental pain and swelling.  Patient reports pain in 2 locations, a left lower tooth and her anterior lower teeth.  She has been taking Tylenol and ibuprofen without improvement, but states that her boss gave her acetaminophen and she had some mild improvement.  Patient with a history of diabetes, states her blood sugars have been good.  Patient was on penicillin several weeks ago, finished a course 2 weeks ago.  Patient recently got dental insurance, has an appointment with a dentist on June 2.  Patient states she has no other medical problems besides diabetes, takes metformin daily.  She denies fevers, chills, difficulty swallowing, difficulty opening her mouth, nausea, vomiting.     HPI  Past Medical History:  Diagnosis Date  . Diabetes mellitus without complication (HCC)   . Ovarian cyst     Patient Active Problem List   Diagnosis Date Noted  . Type 2 diabetes mellitus with hyperglycemia, without long-term current use of insulin (HCC) 05/15/2018    Past Surgical History:  Procedure Laterality Date  . CESAREAN SECTION    . CHOLECYSTECTOMY  2004  . TUBAL LIGATION  10/19/2010     OB History   No obstetric history on file.      Home Medications    Prior to Admission medications   Medication Sig Start Date End Date Taking? Authorizing Provider  acetaminophen (TYLENOL) 500 MG tablet Take 2 tablets (1,000 mg total) by mouth every 8 (eight) hours as needed. 08/04/18   Jaja Switalski, PA-C  amoxicillin-clavulanate (AUGMENTIN) 875-125 MG tablet Take 1 tablet by mouth 2 (two) times daily for 7 days. 08/04/18 08/11/18  Marieli Rudy, PA-C  cephALEXin  (KEFLEX) 500 MG capsule Take 1 capsule (500 mg total) by mouth 2 (two) times daily. Patient not taking: Reported on 05/15/2018 01/18/18   Gilda Crease, MD  chlorhexidine (PERIDEX) 0.12 % solution Use as directed 15 mLs in the mouth or throat 2 (two) times daily. 07/09/18   Roxy Horseman, PA-C  fluconazole (DIFLUCAN) 150 MG tablet Take 1 tablet (150 mg total) by mouth daily for 1 day. 08/04/18 08/05/18  Alicianna Litchford, PA-C  glipiZIDE (GLUCOTROL) 5 MG tablet Take 1 tablet (5 mg total) by mouth daily before breakfast. 05/15/18 08/13/18  Georgina Quint, MD  lidocaine (XYLOCAINE) 2 % solution Use as directed 15 mLs in the mouth or throat as needed for mouth pain. 08/04/18   Jaiden Wahab, PA-C  metFORMIN (GLUCOPHAGE) 500 MG tablet Take 1 tablet (500 mg total) by mouth 2 (two) times daily with a meal. 05/15/18   Sagardia, Eilleen Kempf, MD  metroNIDAZOLE (FLAGYL) 500 MG tablet Take 1 tablet (500 mg total) by mouth 2 (two) times daily. One po bid x 7 days 01/18/18   Gilda Crease, MD  penicillin v potassium (VEETID) 500 MG tablet Take 1 tablet (500 mg total) by mouth 4 (four) times daily. 07/09/18   Roxy Horseman, PA-C    Family History Family History  Problem Relation Age of Onset  . Diabetes Mother   . Hypertension Mother   . Stroke Mother     Social History Social History   Tobacco Use  .  Smoking status: Current Every Day Smoker  . Smokeless tobacco: Never Used  Substance Use Topics  . Alcohol use: Yes    Frequency: Never  . Drug use: Yes    Types: Marijuana     Allergies   Patient has no known allergies.   Review of Systems Review of Systems  Constitutional: Negative for fever.  HENT: Positive for dental problem.      Physical Exam Updated Vital Signs BP 115/74 (BP Location: Right Arm)   Pulse (!) 105   Temp 98.5 F (36.9 C) (Oral)   Resp 16   Ht 5\' 9"  (1.753 m)   Wt 76.7 kg   SpO2 100%   BMI 24.96 kg/m   Physical Exam Vitals signs and  nursing note reviewed.  Constitutional:      General: She is not in acute distress.    Appearance: She is well-developed.     Comments: Appears nontoxic  HENT:     Head: Normocephalic and atraumatic.     Mouth/Throat:     Dentition: Abnormal dentition. Dental tenderness, dental caries and dental abscesses present.      Comments: Poor dentition.  Multiple dental caries.  Tenderness of left lower tooth and lower anterior teeth.  Small area of purulence noted without active drainage.  No trismus.  Handling secretions easily.  No tenderness palpation of the neck. Neck:     Musculoskeletal: Normal range of motion.  Pulmonary:     Effort: Pulmonary effort is normal.  Abdominal:     General: There is no distension.  Musculoskeletal: Normal range of motion.  Skin:    General: Skin is warm.     Findings: No rash.  Neurological:     Mental Status: She is alert and oriented to person, place, and time.      ED Treatments / Results  Labs (all labs ordered are listed, but only abnormal results are displayed) Labs Reviewed - No data to display  EKG None  Radiology No results found.  Procedures Procedures (including critical care time)  Medications Ordered in ED Medications  lidocaine (XYLOCAINE) 2 % viscous mouth solution 15 mL (15 mLs Mouth/Throat Given 08/04/18 2130)     Initial Impression / Assessment and Plan / ED Course  I have reviewed the triage vital signs and the nursing notes.  Pertinent labs & imaging results that were available during my care of the patient were reviewed by me and considered in my medical decision making (see chart for details).        Patient presenting for evaluation of dental pain.  Physical exam reassuring, she appears nontoxic.  Exam is not consistent with blood weeks.  Patient does have a small dental abscess, discussed options of I&D versus treatment with antibiotics and follow-up with dentistry.  Patient does not want to do an I&D today.   As patient was on penicillin, will treat her with Augmentin instead.  Discussed continued treatment with acetaminophen and discussed lidocaine.  Encouraged patient to follow-up with her next dentist at her scheduled appointment.  At this time, patient appears safe for discharge.  Return precautions given.  Patient states she understands and agrees plan.  Final Clinical Impressions(s) / ED Diagnoses   Final diagnoses:  Dental infection  Pain, dental  Dental abscess    ED Discharge Orders         Ordered    amoxicillin-clavulanate (AUGMENTIN) 875-125 MG tablet  2 times daily     08/04/18 2113    lidocaine (  XYLOCAINE) 2 % solution  As needed     08/04/18 2113    acetaminophen (TYLENOL) 500 MG tablet  Every 8 hours PRN     08/04/18 2113    fluconazole (DIFLUCAN) 150 MG tablet  Daily     08/04/18 2128           Alveria Apley, PA-C 08/04/18 2227    Tegeler, Canary Brim, MD 08/05/18 [phone]

## 2018-08-04 NOTE — ED Triage Notes (Signed)
Pt here for a tooth ache since 1 week. Pt states she needs something for the pain.

## 2018-08-04 NOTE — Discharge Instructions (Addendum)
Take Augmentin as prescribed.  Take the entire course, even if your symptoms improve. Use acetaminophen as needed for pain. Use viscous lidocaine to help with pain control. Make sure you are staying well-hydrated water. Keep a close eye on your blood sugars.  If they spike suddenly and are remain high despite your normal treatment, you may need to come in for further evaluation. Return to the emergency room with any new, worsening, concerning symptoms.

## 2018-08-04 NOTE — ED Notes (Signed)
Patient verbalizes understanding of discharge instructions. Opportunity for questioning and answers were provided. Armband removed by staff, pt discharged from ED ambulatory to home.  

## 2018-08-09 ENCOUNTER — Encounter (HOSPITAL_COMMUNITY): Payer: Self-pay | Admitting: Emergency Medicine

## 2018-08-09 ENCOUNTER — Other Ambulatory Visit: Payer: Self-pay

## 2018-08-09 ENCOUNTER — Emergency Department (HOSPITAL_COMMUNITY)
Admission: EM | Admit: 2018-08-09 | Discharge: 2018-08-09 | Disposition: A | Discharge status: Home / Self Care | Payer: PRIVATE HEALTH INSURANCE | Attending: Emergency Medicine | Admitting: Emergency Medicine

## 2018-08-09 DIAGNOSIS — Z79899 Other long term (current) drug therapy: Secondary | ICD-10-CM | POA: Insufficient documentation

## 2018-08-09 DIAGNOSIS — Y9389 Activity, other specified: Secondary | ICD-10-CM | POA: Diagnosis not present

## 2018-08-09 DIAGNOSIS — Y929 Unspecified place or not applicable: Secondary | ICD-10-CM | POA: Insufficient documentation

## 2018-08-09 DIAGNOSIS — F17200 Nicotine dependence, unspecified, uncomplicated: Secondary | ICD-10-CM | POA: Insufficient documentation

## 2018-08-09 DIAGNOSIS — S0502XA Injury of conjunctiva and corneal abrasion without foreign body, left eye, initial encounter: Secondary | ICD-10-CM | POA: Diagnosis not present

## 2018-08-09 DIAGNOSIS — Z77098 Contact with and (suspected) exposure to other hazardous, chiefly nonmedicinal, chemicals: Secondary | ICD-10-CM | POA: Diagnosis not present

## 2018-08-09 DIAGNOSIS — Z7984 Long term (current) use of oral hypoglycemic drugs: Secondary | ICD-10-CM | POA: Diagnosis not present

## 2018-08-09 DIAGNOSIS — S0592XA Unspecified injury of left eye and orbit, initial encounter: Secondary | ICD-10-CM | POA: Diagnosis present

## 2018-08-09 DIAGNOSIS — Y999 Unspecified external cause status: Secondary | ICD-10-CM | POA: Insufficient documentation

## 2018-08-09 DIAGNOSIS — E119 Type 2 diabetes mellitus without complications: Secondary | ICD-10-CM | POA: Diagnosis not present

## 2018-08-09 DIAGNOSIS — X58XXXA Exposure to other specified factors, initial encounter: Secondary | ICD-10-CM | POA: Insufficient documentation

## 2018-08-09 MED ORDER — TETRACAINE HCL 0.5 % OP SOLN
2.0000 [drp] | Freq: Once | OPHTHALMIC | Status: AC
  Administered 2018-08-09: 2 [drp] via OPHTHALMIC
  Filled 2018-08-09: qty 4

## 2018-08-09 MED ORDER — ERYTHROMYCIN 5 MG/GM OP OINT
1.0000 "application " | TOPICAL_OINTMENT | Freq: Once | OPHTHALMIC | Status: AC
  Administered 2018-08-09: 1 via OPHTHALMIC
  Filled 2018-08-09: qty 3.5

## 2018-08-09 MED ORDER — FLUORESCEIN SODIUM 1 MG OP STRP
1.0000 | ORAL_STRIP | Freq: Once | OPHTHALMIC | Status: AC
  Administered 2018-08-09: 1 via OPHTHALMIC
  Filled 2018-08-09: qty 1

## 2018-08-09 NOTE — Discharge Instructions (Addendum)
Please use erythromycin eye ointment on both eyes 4 times daily.  You can use artificial tears drops as needed to soothe eye irritation.  On Monday morning you will need to call Dr. Allena Katz with ophthalmology to schedule a follow-up appointment to have your eyes rechecked to ensure that your corneal abrasion is improving and not worsening.

## 2018-08-09 NOTE — ED Notes (Signed)
Eye rinse out complete.
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
## 2018-08-09 NOTE — ED Notes (Signed)
Eye rinse set up with 1 L bag of NS that will flush over ten minutes.
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
## 2018-08-09 NOTE — ED Provider Notes (Signed)
[facility] EMERGENCY DEPARTMENT Provider Note   CSN: [id number] Arrival date & time: 08/09/18  0204    History   Chief Complaint Chief Complaint  Patient presents with  . Eye Pain    HPI Chasity L Lipko is a 38 y.o. female.     Gwenn L Thrush is a 38 y.o. female with a history of diabetes, who presents to the emergency department for evaluation of bilateral eye irritation.  She reports irritation is worse on the left than the right.  She reports earlier this evening she was working on her [employer] air conditioning system when the Freon pump exploded and splashed on her face.  She reports that she rinsed her eyes out at home and initially had improvement but then burning, irritation and pain returned.  She reports her vision seems blurred and foggy.  Slight light sensitivity.  No redness or swelling around the eye.  No fevers or chills.  She reports that the eyes are tearing more frequently but she has not noted any purulent drainage.  She has not tried anything else to treat her symptoms, no other aggravating or alleviating factors.     Past Medical History:  Diagnosis Date  . Diabetes mellitus without complication (HCC)   . Ovarian cyst     Patient Active Problem List   Diagnosis Date Noted  . Type 2 diabetes mellitus with hyperglycemia, without long-term current use of insulin (HCC) 05/15/2018    Past Surgical History:  Procedure Laterality Date  . CESAREAN SECTION    . CHOLECYSTECTOMY  2004  . TUBAL LIGATION  10/19/2010     OB History   No obstetric history on file.      Home Medications    Prior to Admission medications   Medication Sig Start Date End Date Taking? Authorizing Provider  acetaminophen (TYLENOL) 500 MG tablet Take 2 tablets (1,000 mg total) by mouth every 8 (eight) hours as needed. 08/04/18   Caccavale, Sophia, PA-C  amoxicillin-clavulanate (AUGMENTIN) 875-125 MG tablet Take 1 tablet by mouth 2 (two) times daily for 7  days. 08/04/18 08/11/18  Caccavale, Sophia, PA-C  cephALEXin (KEFLEX) 500 MG capsule Take 1 capsule (500 mg total) by mouth 2 (two) times daily. Patient not taking: Reported on 05/15/2018 01/18/18   Gilda Crease, MD  chlorhexidine (PERIDEX) 0.12 % solution Use as directed 15 mLs in the mouth or throat 2 (two) times daily. 07/09/18   Roxy Horseman, PA-C  glipiZIDE (GLUCOTROL) 5 MG tablet Take 1 tablet (5 mg total) by mouth daily before breakfast. 05/15/18 08/13/18  Georgina Quint, MD  lidocaine (XYLOCAINE) 2 % solution Use as directed 15 mLs in the mouth or throat as needed for mouth pain. 08/04/18   Caccavale, Sophia, PA-C  metFORMIN (GLUCOPHAGE) 500 MG tablet Take 1 tablet (500 mg total) by mouth 2 (two) times daily with a meal. 05/15/18   Sagardia, Eilleen Kempf, MD  metroNIDAZOLE (FLAGYL) 500 MG tablet Take 1 tablet (500 mg total) by mouth 2 (two) times daily. One po bid x 7 days 01/18/18   Gilda Crease, MD  penicillin v potassium (VEETID) 500 MG tablet Take 1 tablet (500 mg total) by mouth 4 (four) times daily. 07/09/18   Roxy Horseman, PA-C    Family History Family History  Problem Relation Age of Onset  . Diabetes Mother   . Hypertension Mother   . Stroke Mother     Social History Social History   Tobacco Use  . Smoking  status: Current Every Day Smoker  . Smokeless tobacco: Never Used  Substance Use Topics  . Alcohol use: Yes    Frequency: Never  . Drug use: Yes    Types: Marijuana     Allergies   Patient has no known allergies.   Review of Systems Review of Systems  Constitutional: Negative for chills and fever.  Eyes: Positive for photophobia, pain, redness, itching and visual disturbance.  Skin: Negative for color change and rash.     Physical Exam Updated Vital Signs BP 126/76 (BP Location: Right Arm)   Pulse 96   Temp 98.3 F (36.8 C) (Oral)   Resp 18   Ht  (1.753 m)   Wt 76.7 kg   SpO2 100%   BMI 24.96 kg/m   Physical Exam  Vitals signs and nursing note reviewed.  Constitutional:      General: She is not in acute distress.    Appearance: She is well-developed. She is not diaphoretic.  HENT:     Head: Normocephalic and atraumatic.  Eyes:     General:        Right eye: No discharge.        Left eye: No discharge.     Comments: Bilateral eyes with slight erythema of the conjunctiva, no visible foreign bodies noted.  No purulent drainage bilaterally.  No periorbital erythema or swelling.  No lesions or erythema of the eyelids, no evidence of frostbite. Fluorescein staining on the left reveals a large rectangular corneal abrasion over the center of the cornea.  Right eye with small corneal abrasion at approximately 8 o'clock. Eye pressure: R18, L 20 PERRLA, no consensual pain, EOMs intact.  Neck:     Musculoskeletal: Neck supple.  Pulmonary:     Effort: Pulmonary effort is normal. No respiratory distress.  Skin:    General: Skin is warm and dry.  Neurological:     Mental Status: She is alert.     Coordination: Coordination normal.  Psychiatric:        Mood and Affect: Mood normal.        Behavior: Behavior normal.      ED Treatments / Results  Labs (all labs ordered are listed, but only abnormal results are displayed) Labs Reviewed - No data to display  EKG None  Radiology No results found.  Procedures Procedures (including critical care time)  Medications Ordered in ED Medications  tetracaine (PONTOCAINE) 0.5 % ophthalmic solution 2 drop (2 drops Both Eyes Given 08/09/18 0455)  fluorescein ophthalmic strip 1 strip (1 strip Both Eyes Given 08/09/18 0455)  erythromycin ophthalmic ointment 1 application (1 application Both Eyes Given 08/09/18 0555)     Initial Impression / Assessment and Plan / ED Course  I have reviewed the triage vital signs and the nursing notes.  Pertinent labs & imaging results that were available during my care of the patient were reviewed by me and considered in my  medical decision making (see chart for details).  Patient presents after Freon exposure to both eyes earlier today.  Poison control consulted and they recommend eye flushing for 10 to 15 minutes and to check for any abrasions, antibiotics if necessary.  Primary risk is to frostbite of the eyelid.  Burning pain and irritation.  She reports blurred vision, worse on the left than the right.  Patient has difficulty with visual acuity.  On exam she has no periorbital swelling or erythema, no frostbite lesions.  Eye pH normal bilaterally.  PERRLA, EOMI.  Fluorescein staining reveals large corneal abrasion, much smaller corneal abrasion on the right.  Normal pressures bilaterally.  Patient's eyes were irrigated with 1 L normal saline with significant improvement in pain and blurred vision.  She will be treated with erythromycin ointment.  Patient to follow-up with Dr. Allena KatzPatel with ophthalmology.  Return precautions discussed.  Patient expresses understanding and agreement with plan.  Discharged home in good condition.  Final Clinical Impressions(s) / ED Diagnoses   Final diagnoses:  Chemical exposure of eye  Abrasion of left cornea, initial encounter    ED Discharge Orders    None       Legrand RamsFord, Jaiyanna Safran N, PA-C 08/09/18 [id number]    Marily MemosMesner, Jason, MD 08/09/18 [phone]

## 2018-08-12 ENCOUNTER — Telehealth: Payer: Self-pay | Admitting: Emergency Medicine

## 2018-08-12 NOTE — Telephone Encounter (Signed)
Called pt LVM for her to call back and resshegule to telemed visit FR

## 2018-08-13 ENCOUNTER — Telehealth (INDEPENDENT_AMBULATORY_CARE_PROVIDER_SITE_OTHER): Payer: PRIVATE HEALTH INSURANCE | Admitting: Emergency Medicine

## 2018-08-13 ENCOUNTER — Other Ambulatory Visit: Payer: Self-pay

## 2018-08-13 ENCOUNTER — Encounter: Payer: Self-pay | Admitting: Emergency Medicine

## 2018-08-13 VITALS — Ht 69.0 in | Wt 169.0 lb

## 2018-08-13 DIAGNOSIS — N390 Urinary tract infection, site not specified: Secondary | ICD-10-CM

## 2018-08-13 DIAGNOSIS — E1165 Type 2 diabetes mellitus with hyperglycemia: Secondary | ICD-10-CM | POA: Diagnosis not present

## 2018-08-13 MED ORDER — GLIPIZIDE 5 MG PO TABS: 5.0000 mg | ORAL_TABLET | Freq: Every day | ORAL | 1 refills | Status: DC

## 2018-08-13 MED ORDER — SULFAMETHOXAZOLE-TRIMETHOPRIM 800-160 MG PO TABS: 1.0000 | ORAL_TABLET | Freq: Two times a day (BID) | ORAL | 0 refills | Status: AC

## 2018-08-13 MED ORDER — METFORMIN HCL 500 MG PO TABS: 500.0000 mg | ORAL_TABLET | Freq: Two times a day (BID) | ORAL | 3 refills | Status: DC

## 2018-08-13 NOTE — Progress Notes (Signed)
Lab Results  Component Value Date   HGBA1C 7.0 (A) 05/15/2018   Lab Results  Component Value Date   CHOL 120 05/15/2018   HDL 80 05/15/2018   LDLCALC 28 05/15/2018   TRIG 58 05/15/2018   CHOLHDL 1.5 05/15/2018   BP Readings from Last 3 Encounters:  08/09/18 123/68  08/04/18 115/74  07/09/18 119/87   Wt Readings from Last 3 Encounters:  08/13/18 169 lb (76.7 kg)  08/09/18 169 lb (76.7 kg)  08/04/18 169 lb (76.7 kg)      Telemedicine Encounter- SOAP NOTE Established Patient  This telephone encounter was conducted with the patient's (or proxy's) verbal consent via audio telecommunications: yes/no: Yes Patient was instructed to have this encounter in a suitably private space; and to only have persons present to whom they give permission to participate. In addition, patient identity was confirmed by use of name plus two identifiers (DOB and address).  I discussed the limitations, risks, security and privacy concerns of performing an evaluation and management service by telephone and the availability of in person appointments. I also discussed with the patient that there may be a patient responsible charge related to this service. The patient expressed understanding and agreed to proceed.  I spent a total of TIME; 0 MIN TO 60 MIN: 15 minutes talking with the patient or their proxy.  No chief complaint on file. Diabetes follow-up  Subjective   Melinda Stone is a 38 y.o. female established patient. Telephone visit today for follow-up of diabetes.  Compliant with medications.  Taking metformin and glipizide.  Asymptomatic.  Does not check her blood sugars at home but is not having polydipsia or polyuria. Concerned she may have a UTI.  Has a history of previous UTI with similar symptoms, complaining of pain to her back and thighs, bellybutton hurting, pressure in the bladder, and occasional burning. No other significant symptoms.  Needs medication refills.  HPI   Patient  Active Problem List   Diagnosis Date Noted  . Type 2 diabetes mellitus with hyperglycemia, without long-term current use of insulin (HCC) 05/15/2018    Past Medical History:  Diagnosis Date  . Diabetes mellitus without complication (HCC)   . Ovarian cyst     Current Outpatient Medications  Medication Sig Dispense Refill  . acetaminophen (TYLENOL) 500 MG tablet Take 2 tablets (1,000 mg total) by mouth every 8 (eight) hours as needed. 30 tablet 0  . glipiZIDE (GLUCOTROL) 5 MG tablet Take 1 tablet (5 mg total) by mouth daily before breakfast. 90 tablet 1  . lidocaine (XYLOCAINE) 2 % solution Use as directed 15 mLs in the mouth or throat as needed for mouth pain. 100 mL 0  . metFORMIN (GLUCOPHAGE) 500 MG tablet Take 1 tablet (500 mg total) by mouth 2 (two) times daily with a meal. 180 tablet 3  . metroNIDAZOLE (FLAGYL) 500 MG tablet Take 1 tablet (500 mg total) by mouth 2 (two) times daily. One po bid x 7 days (Patient not taking: Reported on 08/13/2018) 14 tablet 0  . penicillin v potassium (VEETID) 500 MG tablet Take 1 tablet (500 mg total) by mouth 4 (four) times daily. (Patient not taking: Reported on 08/13/2018) 40 tablet 0   No current facility-administered medications for this visit.     No Known Allergies  Social History   Socioeconomic History  . Marital status: Single    Spouse name: Not on file  . Number of children: Not on file  . Years of education: Not  on file  . Highest education level: Not on file  Occupational History  . Not on file  Social Needs  . Financial resource strain: Not on file  . Food insecurity:    Worry: Not on file    Inability: Not on file  . Transportation needs:    Medical: Not on file    Non-medical: Not on file  Tobacco Use  . Smoking status: Current Every Day Smoker  . Smokeless tobacco: Never Used  Substance and Sexual Activity  . Alcohol use: Yes    Frequency: Never  . Drug use: Yes    Types: Marijuana  . Sexual activity: Not on file   Lifestyle  . Physical activity:    Days per week: Not on file    Minutes per session: Not on file  . Stress: Not on file  Relationships  . Social connections:    Talks on phone: Not on file    Gets together: Not on file    Attends religious service: Not on file    Active member of club or organization: Not on file    Attends meetings of clubs or organizations: Not on file    Relationship status: Not on file  . Intimate partner violence:    Fear of current or ex partner: Not on file    Emotionally abused: Not on file    Physically abused: Not on file    Forced sexual activity: Not on file  Other Topics Concern  . Not on file  Social History Narrative  . Not on file    Review of Systems  Constitutional: Negative.  Negative for chills and fever.  Eyes: Negative.   Respiratory: Negative.  Negative for cough and shortness of breath.   Cardiovascular: Negative.  Negative for chest pain and palpitations.  Gastrointestinal: Negative for abdominal pain, diarrhea, nausea and vomiting.  Genitourinary: Positive for dysuria, frequency and urgency. Negative for flank pain and hematuria.  Musculoskeletal: Negative.  Negative for myalgias.  Skin: Negative.  Negative for rash.  Neurological: Negative for dizziness and headaches.  All other systems reviewed and are negative.   Objective   Vitals as reported by the patient: Today's Vitals   08/13/18 0856  Weight: 169 lb (76.7 kg)  Height: 5\' 9"  (1.753 m)  Awake and oriented x3 in no apparent respiratory distress.  Diagnoses and all orders for this visit:  Type 2 diabetes mellitus with hyperglycemia, without long-term current use of insulin (HCC) -     glipiZIDE (GLUCOTROL) 5 MG tablet; Take 1 tablet (5 mg total) by mouth daily before breakfast. -     metFORMIN (GLUCOPHAGE) 500 MG tablet; Take 1 tablet (500 mg total) by mouth 2 (two) times daily with a meal. -     Comprehensive metabolic panel; Future -     Hemoglobin A1c; Future -      Lipid panel; Future  Acute UTI -     sulfamethoxazole-trimethoprim (BACTRIM DS) 800-160 MG tablet; Take 1 tablet by mouth 2 (two) times daily for 7 days.   Clinically stable.  May have a UTI.  Will treat with antibiotics. Continue diabetic medications.  No changes. We will schedule blood work. Office visit in 2 to 3 months.    I discussed the assessment and treatment plan with the patient. The patient was provided an opportunity to ask questions and all were answered. The patient agreed with the plan and demonstrated an understanding of the instructions.   The patient was advised to  call back or seek an in-person evaluation if the symptoms worsen or if the condition fails to improve as anticipated.  I provided 15 minutes of non-face-to-face time during this encounter.  Horald Pollen, MD  Primary Care at [facility]

## 2018-08-13 NOTE — Progress Notes (Signed)
Called patient to triage for appointment. Patient is following up on diabetes. Patient states she need refill on medication. Patient states she does not check blood sugar because she does not have a monitor. Also, patient states she has a UTI, because when her belly button hurts that is a sign of a UTI. Patient states it started 3 days ago, she does not have an odor to her urine

## 2018-10-07 ENCOUNTER — Ambulatory Visit: Payer: PRIVATE HEALTH INSURANCE | Admitting: Emergency Medicine

## 2018-10-13 ENCOUNTER — Ambulatory Visit: Payer: PRIVATE HEALTH INSURANCE | Admitting: Emergency Medicine

## 2018-10-20 ENCOUNTER — Other Ambulatory Visit: Payer: Self-pay

## 2018-10-20 ENCOUNTER — Encounter: Payer: Self-pay | Admitting: Emergency Medicine

## 2018-10-20 ENCOUNTER — Ambulatory Visit (INDEPENDENT_AMBULATORY_CARE_PROVIDER_SITE_OTHER): Payer: PRIVATE HEALTH INSURANCE | Admitting: Emergency Medicine

## 2018-10-20 VITALS — BP 92/57 | HR 90 | Temp 98.0°F | Resp 16 | Ht 69.0 in | Wt 173.0 lb

## 2018-10-20 DIAGNOSIS — E1165 Type 2 diabetes mellitus with hyperglycemia: Secondary | ICD-10-CM

## 2018-10-20 DIAGNOSIS — G4489 Other headache syndrome: Secondary | ICD-10-CM

## 2018-10-20 LAB — POCT GLYCOSYLATED HEMOGLOBIN (HGB A1C): Hemoglobin A1C: 7.8 % — AB (ref 4.0–5.6)

## 2018-10-20 LAB — GLUCOSE, POCT (MANUAL RESULT ENTRY): POC Glucose: 372 mg/dl — AB (ref 70–99)

## 2018-10-20 MED ORDER — EMPAGLIFLOZIN 10 MG PO TABS: 10.0000 mg | ORAL_TABLET | Freq: Every day | ORAL | 3 refills | Status: AC

## 2018-10-20 MED ORDER — EXCEDRIN MIGRAINE 250-250-65 MG PO TABS: 2.0000 | ORAL_TABLET | Freq: Four times a day (QID) | ORAL | 0 refills | Status: DC | PRN

## 2018-10-20 MED ORDER — TRULICITY 0.75 MG/0.5ML ~~LOC~~ SOAJ: 0.7500 mg | SUBCUTANEOUS | 5 refills | Status: DC

## 2018-10-20 NOTE — Assessment & Plan Note (Addendum)
Suspected hypoglycemic attacks from glipizide.  GI side effects from metformin.  Unable to tolerate.  Hemoglobin A1c at 7.8, higher than before.  Will change medications to Trulicity once a week and Jardiance 10 mg daily.  Follow-up in 3 months.

## 2018-10-20 NOTE — Patient Instructions (Addendum)
   If you have lab work done today you will be contacted with your lab results within the next 2 weeks.  If you have not heard from us then please contact us. The fastest way to get your results is to register for My Chart.   IF you received an x-ray today, you will receive an invoice from [facility]. Please contact  Radiology at [phone] with questions or concerns regarding your invoice.   IF you received labwork today, you will receive an invoice from [facility]. Please contact [facility] at [phone] with questions or concerns regarding your invoice.   Our billing staff will not be able to assist you with questions regarding bills from these companies.  You will be contacted with the lab results as soon as they are available. The fastest way to get your results is to activate your My Chart account. Instructions are located on the last page of this paperwork. If you have not heard from us regarding the results in 2 weeks, please contact this office.     Diabetes Mellitus and Nutrition, Adult When you have diabetes (diabetes mellitus), it is very important to have healthy eating habits because your blood sugar (glucose) levels are greatly affected by what you eat and drink. Eating healthy foods in the appropriate amounts, at about the same times every day, can help you:  Control your blood glucose.  Lower your risk of heart disease.  Improve your blood pressure.  Reach or maintain a healthy weight. Every person with diabetes is different, and each person has different needs for a meal plan. Your health care provider may recommend that you work with a diet and nutrition specialist (dietitian) to make a meal plan that is best for you. Your meal plan may vary depending on factors such as:  The calories you need.  The medicines you take.  Your weight.  Your blood glucose, blood pressure, and cholesterol levels.  Your activity level.  Other health conditions  you have, such as heart or kidney disease. How do carbohydrates affect me? Carbohydrates, also called carbs, affect your blood glucose level more than any other type of food. Eating carbs naturally raises the amount of glucose in your blood. Carb counting is a method for keeping track of how many carbs you eat. Counting carbs is important to keep your blood glucose at a healthy level, especially if you use insulin or take certain oral diabetes medicines. It is important to know how many carbs you can safely have in each meal. This is different for every person. Your dietitian can help you calculate how many carbs you should have at each meal and for each snack. Foods that contain carbs include:  Bread, cereal, rice, pasta, and crackers.  Potatoes and corn.  Peas, beans, and lentils.  Milk and yogurt.  Fruit and juice.  Desserts, such as cakes, cookies, ice cream, and candy. How does alcohol affect me? Alcohol can cause a sudden decrease in blood glucose (hypoglycemia), especially if you use insulin or take certain oral diabetes medicines. Hypoglycemia can be a life-threatening condition. Symptoms of hypoglycemia (sleepiness, dizziness, and confusion) are similar to symptoms of having too much alcohol. If your health care provider says that alcohol is safe for you, follow these guidelines:  Limit alcohol intake to no more than 1 drink per day for nonpregnant women and 2 drinks per day for men. One drink equals 12 oz of beer, 5 oz of wine, or 1 oz of hard liquor.    Do not drink on an empty stomach.  Keep yourself hydrated with water, diet soda, or unsweetened iced tea.  Keep in mind that regular soda, juice, and other mixers may contain a lot of sugar and must be counted as carbs. What are tips for following this plan?  Reading food labels  Start by checking the serving size on the "Nutrition Facts" label of packaged foods and drinks. The amount of calories, carbs, fats, and other  nutrients listed on the label is based on one serving of the item. Many items contain more than one serving per package.  Check the total grams (g) of carbs in one serving. You can calculate the number of servings of carbs in one serving by dividing the total carbs by 15. For example, if a food has 30 g of total carbs, it would be equal to 2 servings of carbs.  Check the number of grams (g) of saturated and trans fats in one serving. Choose foods that have low or no amount of these fats.  Check the number of milligrams (mg) of salt (sodium) in one serving. Most people should limit total sodium intake to less than 2,300 mg per day.  Always check the nutrition information of foods labeled as "low-fat" or "nonfat". These foods may be higher in added sugar or refined carbs and should be avoided.  Talk to your dietitian to identify your daily goals for nutrients listed on the label. Shopping  Avoid buying canned, premade, or processed foods. These foods tend to be high in fat, sodium, and added sugar.  Shop around the outside edge of the grocery store. This includes fresh fruits and vegetables, bulk grains, fresh meats, and fresh dairy. Cooking  Use low-heat cooking methods, such as baking, instead of high-heat cooking methods like deep frying.  Cook using healthy oils, such as olive, canola, or sunflower oil.  Avoid cooking with butter, cream, or high-fat meats. Meal planning  Eat meals and snacks regularly, preferably at the same times every day. Avoid going long periods of time without eating.  Eat foods high in fiber, such as fresh fruits, vegetables, beans, and whole grains. Talk to your dietitian about how many servings of carbs you can eat at each meal.  Eat 4-6 ounces (oz) of lean protein each day, such as lean meat, chicken, fish, eggs, or tofu. One oz of lean protein is equal to: ? 1 oz of meat, chicken, or fish. ? 1 egg. ?  cup of tofu.  Eat some foods each day that contain  healthy fats, such as avocado, nuts, seeds, and fish. Lifestyle  Check your blood glucose regularly.  Exercise regularly as told by your health care provider. This may include: ? 150 minutes of moderate-intensity or vigorous-intensity exercise each week. This could be brisk walking, biking, or water aerobics. ? Stretching and doing strength exercises, such as yoga or weightlifting, at least 2 times a week.  Take medicines as told by your health care provider.  Do not use any products that contain nicotine or tobacco, such as cigarettes and e-cigarettes. If you need help quitting, ask your health care provider.  Work with a counselor or diabetes educator to identify strategies to manage stress and any emotional and social challenges. Questions to ask a health care provider  Do I need to meet with a diabetes educator?  Do I need to meet with a dietitian?  What number can I call if I have questions?  When are the best times to   check my blood glucose? Where to find more information:  American Diabetes Association: diabetes.org  Academy of Nutrition and Dietetics: www.eatright.org  National Institute of Diabetes and Digestive and Kidney Diseases (NIH): www.niddk.nih.gov Summary  A healthy meal plan will help you control your blood glucose and maintain a healthy lifestyle.  Working with a diet and nutrition specialist (dietitian) can help you make a meal plan that is best for you.  Keep in mind that carbohydrates (carbs) and alcohol have immediate effects on your blood glucose levels. It is important to count carbs and to use alcohol carefully. This information is not intended to replace advice given to you by your health care provider. Make sure you discuss any questions you have with your health care provider. Document Released: 11/30/2004 Document Revised: 02/15/2017 Document Reviewed: 04/09/2016 Elsevier Patient Education  2020 [employer]  

## 2018-10-20 NOTE — Progress Notes (Signed)
Lab Results  Component Value Date   HGBA1C 7.0 (A) 05/15/2018   Lab Results  Component Value Date   CHOL 120 05/15/2018   HDL 80 05/15/2018   LDLCALC 28 05/15/2018   TRIG 58 05/15/2018   CHOLHDL 1.5 05/15/2018   BP Readings from Last 3 Encounters:  10/20/18 (!) 92/57  08/09/18 123/68  08/04/18 115/74   Melinda Stone 38 y.o.   Chief Complaint  Patient presents with   Diabetes    per patient both feet swelling last month   Headache    per patient for months    HISTORY OF PRESENT ILLNESS: This is a 38 y.o. female with history of diabetes here for follow-up.  Taking glipizide and metformin however metformin gives her bad GI side effects and glipizide causing hypoglycemic episodes. Also complaining of almost daily diffuse throbbing headaches for the past 2 months.  Has a history of chronic headaches but more frequently lately.  Denies nausea, vomiting, photophobia, visual disturbances, syncope, or any other associated symptoms.  No recent head injuries. Also noticed last month that her feet were swollen but much better today. No lifestyle changes.  Low stress level.  Sleeps at least 8 hours.  Denies smoking or drinking.  Eats 3 meals a day, adequate nutrition. No other complaints or medical concerns today.  HPI   Prior to Admission medications   Medication Sig Start Date End Date Taking? Authorizing Provider  glipiZIDE (GLUCOTROL) 5 MG tablet Take 1 tablet (5 mg total) by mouth daily before breakfast. 08/13/18 11/11/18 Yes Balinda Heacock, Eilleen KempfMiguel Jose, MD  metFORMIN (GLUCOPHAGE) 500 MG tablet Take 1 tablet (500 mg total) by mouth 2 (two) times daily with a meal. 08/13/18  Yes Selam Pietsch, Eilleen KempfMiguel Jose, MD  acetaminophen (TYLENOL) 500 MG tablet Take 2 tablets (1,000 mg total) by mouth every 8 (eight) hours as needed. Patient not taking: Reported on 10/20/2018 08/04/18   Caccavale, Sophia, PA-C  lidocaine (XYLOCAINE) 2 % solution Use as directed 15 mLs in the mouth or throat as needed  for mouth pain. Patient not taking: Reported on 10/20/2018 08/04/18   Caccavale, Sophia, PA-C    No Known Allergies  Patient Active Problem List   Diagnosis Date Noted   Type 2 diabetes mellitus with hyperglycemia, without long-term current use of insulin (HCC) 05/15/2018    Past Medical History:  Diagnosis Date   Diabetes mellitus without complication (HCC)    Ovarian cyst     Past Surgical History:  Procedure Laterality Date   CESAREAN SECTION     CHOLECYSTECTOMY  2004   TUBAL LIGATION  10/19/2010    Social History   Socioeconomic History   Marital status: Single    Spouse name: Not on file   Number of children: Not on file   Years of education: Not on file   Highest education level: Not on file  Occupational History   Not on file  Social Needs   Financial resource strain: Not on file   Food insecurity    Worry: Not on file    Inability: Not on file   Transportation needs    Medical: Not on file    Non-medical: Not on file  Tobacco Use   Smoking status: Current Every Day Smoker   Smokeless tobacco: Never Used  Substance and Sexual Activity   Alcohol use: Yes    Frequency: Never   Drug use: Yes    Types: Marijuana   Sexual activity: Not on file  Lifestyle   Physical  activity    Days per week: Not on file    Minutes per session: Not on file   Stress: Not on file  Relationships   Social connections    Talks on phone: Not on file    Gets together: Not on file    Attends religious service: Not on file    Active member of club or organization: Not on file    Attends meetings of clubs or organizations: Not on file    Relationship status: Not on file   Intimate partner violence    Fear of current or ex partner: Not on file    Emotionally abused: Not on file    Physically abused: Not on file    Forced sexual activity: Not on file  Other Topics Concern   Not on file  Social History Narrative   Not on file    Family History    Problem Relation Age of Onset   Diabetes Mother    Hypertension Mother    Stroke Mother      Review of Systems  Constitutional: Negative.  Negative for chills and fever.  HENT: Negative.  Negative for congestion and sore throat.   Eyes: Negative.  Negative for blurred vision and double vision.  Respiratory: Negative.  Negative for cough and shortness of breath.   Cardiovascular: Negative.   Gastrointestinal: Negative.  Negative for abdominal pain, blood in stool, diarrhea, nausea and vomiting.  Skin: Negative.  Negative for rash.  Neurological: Positive for headaches.  All other systems reviewed and are negative.  Vitals:   10/20/18 0824  BP: (!) 92/57  Pulse: 90  Resp: 16  Temp: 98 F (36.7 C)  SpO2: 100%     Physical Exam Vitals signs reviewed.  Constitutional:      Appearance: She is well-developed.  HENT:     Head: Normocephalic and atraumatic.     Nose: Nose normal.  Eyes:     Extraocular Movements: Extraocular movements intact.     Conjunctiva/sclera: Conjunctivae normal.     Pupils: Pupils are equal, round, and reactive to light.  Neck:     Musculoskeletal: Normal range of motion and neck supple.  Cardiovascular:     Rate and Rhythm: Normal rate and regular rhythm.     Pulses: Normal pulses.     Heart sounds: Normal heart sounds.  Pulmonary:     Effort: Pulmonary effort is normal.     Breath sounds: Normal breath sounds.  Musculoskeletal: Normal range of motion.  Lymphadenopathy:     Cervical: No cervical adenopathy.  Skin:    General: Skin is warm and dry.     Capillary Refill: Capillary refill takes less than 2 seconds.  Neurological:     General: No focal deficit present.     Mental Status: She is alert and oriented to person, place, and time.     Cranial Nerves: No cranial nerve deficit.     Sensory: No sensory deficit.     Motor: No weakness.  Psychiatric:        Mood and Affect: Mood normal.        Behavior: Behavior normal.     Results for orders placed or performed in visit on 10/20/18 (from the past 24 hour(s))  POCT glucose (manual entry)     Status: Abnormal   Collection Time: 10/20/18  9:26 AM  Result Value Ref Range   POC Glucose 372 (A) 70 - 99 mg/dl  POCT glycosylated hemoglobin (Hb A1C)  Status: Abnormal   Collection Time: 10/20/18  9:28 AM  Result Value Ref Range   Hemoglobin A1C 7.8 (A) 4.0 - 5.6 %   HbA1c POC (<> result, manual entry)     HbA1c, POC (prediabetic range)     HbA1c, POC (controlled diabetic range)        ASSESSMENT & PLAN: Type 2 diabetes mellitus with hyperglycemia, without long-term current use of insulin (HCC) Suspected hypoglycemic attacks from glipizide.  GI side effects from metformin.  Unable to tolerate.  Hemoglobin A1c at 7.8, higher than before.  Will change medications to Trulicity once a week and Jardiance 10 mg daily.  Follow-up in 3 months.  Harry was seen today for diabetes and headache.  Diagnoses and all orders for this visit:  Type 2 diabetes mellitus with hyperglycemia, without long-term current use of insulin (HCC) -     CBC with Differential/Platelet -     Cancel: COMPLETE METABOLIC PANEL WITH GFR -     Lipid panel -     Ambulatory referral to Ophthalmology -     POCT glycosylated hemoglobin (Hb A1C) -     POCT glucose (manual entry) -     Dulaglutide (TRULICITY) 0.75 MG/0.5ML SOPN; Inject 0.75 mg into the skin once a week. -     empagliflozin (JARDIANCE) 10 MG TABS tablet; Take 10 mg by mouth daily. -     Comprehensive metabolic panel  Other headache syndrome -     aspirin-acetaminophen-caffeine (EXCEDRIN MIGRAINE) 250-250-65 MG tablet; Take 2 tablets by mouth every 6 (six) hours as needed for headache.    Patient Instructions       If you have lab work done today you will be contacted with your lab results within the next 2 weeks.  If you have not heard from [place] then please contact us. The fastest way to get your results is to  register for My Chart.   IF you received an x-ray today, you will receive an invoice from [facility]. Please contact [facility] at [phone] with questions or concerns regarding your invoice.   IF you received labwork today, you will receive an invoice from [facility]. Please contact [facility] at [phone] with questions or concerns regarding your invoice.   Our billing staff will not be able to assist you with questions regarding bills from these companies.  You will be contacted with the lab results as soon as they are available. The fastest way to get your results is to activate your My Chart account. Instructions are located on the last page of this paperwork. If you have not heard from [place] regarding the results in 2 weeks, please contact this office.     Diabetes Mellitus and Nutrition, Adult When you have diabetes (diabetes mellitus), it is very important to have healthy eating habits because your blood sugar (glucose) levels are greatly affected by what you eat and drink. Eating healthy foods in the appropriate amounts, at about the same times every day, can help you:  Control your blood glucose.  Lower your risk of heart disease.  Improve your blood pressure.  Reach or maintain a healthy weight. Every person with diabetes is different, and each person has different needs for a meal plan. Your health care provider may recommend that you work with a diet and nutrition specialist (dietitian) to make a meal plan that is best for you. Your meal plan may vary depending on factors such as:  The calories you need.  The medicines you  take.  Your weight.  Your blood glucose, blood pressure, and cholesterol levels.  Your activity level.  Other health conditions you have, such as heart or kidney disease. How do carbohydrates affect me? Carbohydrates, also called carbs, affect your blood glucose level more than any other type of food. Eating carbs naturally  raises the amount of glucose in your blood. Carb counting is a method for keeping track of how many carbs you eat. Counting carbs is important to keep your blood glucose at a healthy level, especially if you use insulin or take certain oral diabetes medicines. It is important to know how many carbs you can safely have in each meal. This is different for every person. Your dietitian can help you calculate how many carbs you should have at each meal and for each snack. Foods that contain carbs include:  Bread, cereal, rice, pasta, and crackers.  Potatoes and corn.  Peas, beans, and lentils.  Milk and yogurt.  Fruit and juice.  Desserts, such as cakes, cookies, ice cream, and candy. How does alcohol affect me? Alcohol can cause a sudden decrease in blood glucose (hypoglycemia), especially if you use insulin or take certain oral diabetes medicines. Hypoglycemia can be a life-threatening condition. Symptoms of hypoglycemia (sleepiness, dizziness, and confusion) are similar to symptoms of having too much alcohol. If your health care provider says that alcohol is safe for you, follow these guidelines:  Limit alcohol intake to no more than 1 drink per day for nonpregnant women and 2 drinks per day for men. One drink equals 12 oz of beer, 5 oz of wine, or 1 oz of hard liquor.  Do not drink on an empty stomach.  Keep yourself hydrated with water, diet soda, or unsweetened iced tea.  Keep in mind that regular soda, juice, and other mixers may contain a lot of sugar and must be counted as carbs. What are tips for following this plan?  Reading food labels  Start by checking the serving size on the "Nutrition Facts" label of packaged foods and drinks. The amount of calories, carbs, fats, and other nutrients listed on the label is based on one serving of the item. Many items contain more than one serving per package.  Check the total grams (g) of carbs in one serving. You can calculate the number  of servings of carbs in one serving by dividing the total carbs by 15. For example, if a food has 30 g of total carbs, it would be equal to 2 servings of carbs.  Check the number of grams (g) of saturated and trans fats in one serving. Choose foods that have low or no amount of these fats.  Check the number of milligrams (mg) of salt (sodium) in one serving. Most people should limit total sodium intake to less than 2,300 mg per day.  Always check the nutrition information of foods labeled as "low-fat" or "nonfat". These foods may be higher in added sugar or refined carbs and should be avoided.  Talk to your dietitian to identify your daily goals for nutrients listed on the label. Shopping  Avoid buying canned, premade, or processed foods. These foods tend to be high in fat, sodium, and added sugar.  Shop around the outside edge of the grocery store. This includes fresh fruits and vegetables, bulk grains, fresh meats, and fresh dairy. Cooking  Use low-heat cooking methods, such as baking, instead of high-heat cooking methods like deep frying.  Cook using healthy oils, such as olive, canola,  or sunflower oil.  Avoid cooking with butter, cream, or high-fat meats. Meal planning  Eat meals and snacks regularly, preferably at the same times every day. Avoid going long periods of time without eating.  Eat foods high in fiber, such as fresh fruits, vegetables, beans, and whole grains. Talk to your dietitian about how many servings of carbs you can eat at each meal.  Eat 4-6 ounces (oz) of lean protein each day, such as lean meat, chicken, fish, eggs, or tofu. One oz of lean protein is equal to: ? 1 oz of meat, chicken, or fish. ? 1 egg. ?  cup of tofu.  Eat some foods each day that contain healthy fats, such as avocado, nuts, seeds, and fish. Lifestyle  Check your blood glucose regularly.  Exercise regularly as told by your health care provider. This may include: ? 150 minutes of  moderate-intensity or vigorous-intensity exercise each week. This could be brisk walking, biking, or water aerobics. ? Stretching and doing strength exercises, such as yoga or weightlifting, at least 2 times a week.  Take medicines as told by your health care provider.  Do not use any products that contain nicotine or tobacco, such as cigarettes and e-cigarettes. If you need help quitting, ask your health care provider.  Work with a Social worker or diabetes educator to identify strategies to manage stress and any emotional and social challenges. Questions to ask a health care provider  Do I need to meet with a diabetes educator?  Do I need to meet with a dietitian?  What number can I call if I have questions?  When are the best times to check my blood glucose? Where to find more information:  American Diabetes Association: diabetes.org  Academy of Nutrition and Dietetics: www.eatright.[employer] of Diabetes and Digestive and Kidney Diseases (NIH): [employer] Summary  A healthy meal plan will help you control your blood glucose and maintain a healthy lifestyle.  Working with a diet and nutrition specialist (dietitian) can help you make a meal plan that is best for you.  Keep in mind that carbohydrates (carbs) and alcohol have immediate effects on your blood glucose levels. It is important to count carbs and to use alcohol carefully. This information is not intended to replace advice given to you by your health care provider. Make sure you discuss any questions you have with your health care provider. Document Released: 11/30/2004 Document Revised: 02/15/2017 Document Reviewed: 04/09/2016 Elsevier Patient Education  2020 Elsevier Inc.      Agustina Caroli, MD [facility]

## 2018-10-21 ENCOUNTER — Telehealth: Payer: Self-pay | Admitting: Emergency Medicine

## 2018-10-21 LAB — COMPREHENSIVE METABOLIC PANEL
ALT: 7 IU/L (ref 0–32)
AST: 8 IU/L (ref 0–40)
Albumin/Globulin Ratio: 1.6 (ref 1.2–2.2)
Albumin: 4.2 g/dL (ref 3.8–4.8)
Alkaline Phosphatase: 85 IU/L (ref 39–117)
BUN/Creatinine Ratio: 19 (ref 9–23)
BUN: 10 mg/dL (ref 6–20)
Bilirubin Total: 1.7 mg/dL — ABNORMAL HIGH (ref 0.0–1.2)
CO2: 22 mmol/L (ref 20–29)
Calcium: 8.7 mg/dL (ref 8.7–10.2)
Chloride: 98 mmol/L (ref 96–106)
Creatinine, Ser: 0.54 mg/dL — ABNORMAL LOW (ref 0.57–1.00)
GFR calc Af Amer: 140 mL/min/{1.73_m2} (ref >59)
GFR calc non Af Amer: 122 mL/min/{1.73_m2} (ref >59)
Globulin, Total: 2.6 g/dL (ref 1.5–4.5)
Glucose: 338 mg/dL — ABNORMAL HIGH (ref 65–99)
Potassium: 4 mmol/L (ref 3.5–5.2)
Sodium: 135 mmol/L (ref 134–144)
Total Protein: 6.8 g/dL (ref 6.0–8.5)

## 2018-10-21 NOTE — Telephone Encounter (Signed)
Patient called in stating medication that was sent in on 8/3 is too expensive and she is wanting an alternative. Please advise.

## 2018-10-21 NOTE — Telephone Encounter (Signed)
Please advise. Dgaddy, CMA 

## 2018-10-22 LAB — CBC WITH DIFFERENTIAL/PLATELET
Basophils Absolute: 0.1 10*3/uL (ref 0.0–0.2)
Basos: 2 %
EOS (ABSOLUTE): 0.1 10*3/uL (ref 0.0–0.4)
Eos: 2 %
Hematocrit: 29.5 % — ABNORMAL LOW (ref 34.0–46.6)
Hemoglobin: 9.7 g/dL — ABNORMAL LOW (ref 11.1–15.9)
Immature Grans (Abs): 0 10*3/uL (ref 0.0–0.1)
Immature Granulocytes: 0 %
Lymphocytes Absolute: 2 10*3/uL (ref 0.7–3.1)
Lymphs: 25 %
MCH: 25.3 pg — ABNORMAL LOW (ref 26.6–33.0)
MCHC: 32.9 g/dL (ref 31.5–35.7)
MCV: 77 fL — ABNORMAL LOW (ref 79–97)
Monocytes Absolute: 0.5 10*3/uL (ref 0.1–0.9)
Monocytes: 6 %
Neutrophils Absolute: 5.4 10*3/uL (ref 1.4–7.0)
Neutrophils: 65 %
Platelets: 213 10*3/uL (ref 150–450)
RBC: 3.83 x10E6/uL (ref 3.77–5.28)
RDW: 17.5 % — ABNORMAL HIGH (ref 11.7–15.4)
WBC: 8.2 10*3/uL (ref 3.4–10.8)

## 2018-10-22 LAB — LIPID PANEL

## 2018-10-22 NOTE — Telephone Encounter (Signed)
These are diabetic medications.  She is intolerant to the ones she was taking before.  We need to find out what medications are covered by her insurance.  We cannot be playing guessing games.  Thanks.

## 2018-10-23 ENCOUNTER — Telehealth: Payer: Self-pay

## 2018-10-23 NOTE — Telephone Encounter (Signed)
Acknowledged. Thanks

## 2018-10-23 NOTE — Telephone Encounter (Signed)
Spoke with pt and she is wanting an alternative to trulicity and jardiance as they are too expensive.  Pt says her sciatica is flaring up today and would like muscle relaxer phone in.  Advised she would need an appt for evaluation for medication. Contacted pt insurance and spoke with [facility] and she advises that pt rx coverage doesn't have a formulary list and each time a rx is prescribed we would have to call them to check if it is covered.  Advised Dr. Mitchel Honour of this an he ask if what will they cover. I called back and spoke with [facility] twice and none for the meds the pt and take are covered.(nesina, tradjenta, [place], [place], trulicity, [place], bydureon, victoza, [place], farxiga, jardiance or steglatro -none are covered by her plan)  The plan has a restrictive formulary and no prior authorizations are allowed.   Per [facility] left detailed message advising rx coverage will not cover any of the diabetic medications we tried to get covered.  Advised the plan she has doesn't cover anything and has restrictive formulary with no prior authorizations allowed.  Advised dr Mitchel Honour has referred to endocrinology to manage her diabetes and to continue to take what she has as she can tolerate until she sees endocrinologist.  Advised to contact our office with any further questions or concerns.  Dgaddy, CMA

## 2018-10-23 NOTE — Addendum Note (Signed)
Addended by: Davina Poke on: 10/23/2018 03:45 PM   Modules accepted: Orders

## 2018-10-29 NOTE — Telephone Encounter (Signed)
See 10/23/2018 note. Dgaddy, CMA

## 2018-11-20 ENCOUNTER — Other Ambulatory Visit: Payer: Self-pay

## 2018-11-25 ENCOUNTER — Encounter: Payer: PRIVATE HEALTH INSURANCE | Admitting: Internal Medicine

## 2018-11-25 NOTE — Progress Notes (Deleted)
Name: Melinda Stone [place]  MRN/ DOB: [id number], 1980/09/19   Age/ Sex: 38 y.o., female    PCP: Georgina QuintSagardia, Miguel Jose, MD   Reason for Endocrinology Evaluation: Type 2 Diabetes Mellitus     Date of Initial Endocrinology Visit: 11/25/2018     PATIENT IDENTIFIER: [name] Melinda Stone is a 38 y.o. female with a past medical history of ***. The patient presented for initial endocrinology clinic visit on 11/25/2018 for consultative assistance with her diabetes management.    HPI: [name] was    Diagnosed with DM *** Prior Medications tried/Intolerance: *** Currently checking blood sugars *** x / day,  before breakfast and ***.  Hypoglycemia episodes : ***               Symptoms: ***                 Frequency: ***/  Hemoglobin A1c has ranged from 7.0%in 04/2018, peaking at 7.8% in 10/2018. Patient required assistance for hypoglycemia:  Patient has required hospitalization within the last 1 year from hyper or hypoglycemia:   In terms of diet, the patient ***   HOME DIABETES REGIMEN: Trulicity 0.75 mg weekly  Jardiance 10 mg daily    Statin: {Yes/No:11203} ACE-I/ARB: {YES/NO:17245} Prior Diabetic Education: {Yes/No:11203}   METER DOWNLOAD SUMMARY: Date range evaluated: *** Fingerstick Blood Glucose Tests = *** Average Number Tests/Day = *** Overall Mean FS Glucose = *** Standard Deviation = ***  BG Ranges: Low = *** High = ***   Hypoglycemic Events/30 Days: BG < 50 = *** Episodes of symptomatic severe hypoglycemia = ***   DIABETIC COMPLICATIONS: Microvascular complications:   ***  Denies: CKD  Last eye exam: Completed   Macrovascular complications:   ***  Denies: CAD, PVD, CVA   PAST HISTORY: Past Medical History:  Past Medical History:  Diagnosis Date  . Diabetes mellitus without complication (HCC)   . Ovarian cyst     Past Surgical History:  Past Surgical History:  Procedure Laterality Date  . CESAREAN SECTION    . CHOLECYSTECTOMY   2004  . TUBAL LIGATION  10/19/2010      Social History:  reports that she has been smoking. She has never used smokeless tobacco. She reports current alcohol use. She reports current drug use. Drug: Marijuana. Family History:  Family History  Problem Relation Age of Onset  . Diabetes Mother   . Hypertension Mother   . Stroke Mother       HOME MEDICATIONS: Allergies as of 11/25/2018   No Known Allergies     Medication List       Accurate as of November 25, 2018  7:48 AM. If you have any questions, ask your nurse or doctor.        acetaminophen 500 MG tablet Commonly known as: TYLENOL Take 2 tablets (1,000 mg total) by mouth every 8 (eight) hours as needed.   empagliflozin 10 MG Tabs tablet Commonly known as: JARDIANCE Take 10 mg by mouth daily.   Excedrin Migraine 250-250-65 MG tablet Generic drug: aspirin-acetaminophen-caffeine Take 2 tablets by mouth every 6 (six) hours as needed for headache.   lidocaine 2 % solution Commonly known as: XYLOCAINE Use as directed 15 mLs in the mouth or throat as needed for mouth pain.   Trulicity 0.75 MG/0.5ML Sopn Generic drug: Dulaglutide Inject 0.75 mg into the skin once a week.        ALLERGIES: No Known Allergies   REVIEW OF SYSTEMS: A comprehensive ROS  was conducted with the patient and is negative except as per HPI and below:  ROS    OBJECTIVE:   VITAL SIGNS: There were no vitals taken for this visit.   PHYSICAL EXAM:  General: Pt appears well and is in NAD  Hydration: Well-hydrated with moist mucous membranes and good skin turgor  HEENT: Head: Unremarkable with good dentition. Oropharynx clear without exudate.  Eyes: External eye exam normal without stare, lid lag or exophthalmos.  EOM intact.  PERRL.  Neck: General: Supple without adenopathy or carotid bruits. Thyroid: Thyroid size normal.  No goiter or nodules appreciated. No thyroid bruit.  Lungs: Clear with good BS bilat with no rales, rhonchi, or  wheezes  Heart: RRR with normal S1 and S2 and no gallops; no murmurs; no rub  Abdomen: Normoactive bowel sounds, soft, nontender, without masses or organomegaly palpable  Extremities:  Lower extremities - No pretibial edema. No lesions.  Skin: Normal texture and temperature to palpation. No rash noted. No Acanthosis nigricans/skin tags. No lipohypertrophy.  Neuro: MS is good with appropriate affect, pt is alert and Ox3    DM foot exam:    DATA REVIEWED:  Lab Results  Component Value Date   HGBA1C 7.8 (A) 10/20/2018   HGBA1C 7.0 (A) 05/15/2018   Lab Results  Component Value Date   LDLCALC 28 05/15/2018   CREATININE 0.54 (Stone) 10/20/2018    Lab Results  Component Value Date   CHOL CANCELED 10/20/2018   HDL CANCELED 10/20/2018   LDLCALC 28 05/15/2018   TRIG CANCELED 10/20/2018   CHOLHDL 1.5 05/15/2018        ASSESSMENT / PLAN / RECOMMENDATIONS:   1) Type 2 Diabetes Mellitus, Sub-Optimally controlled, Without complications - Most recent A1c of 7.8 %. Goal A1c < 7.0 %.   Plan: GENERAL:  ***  MEDICATIONS:  ***  EDUCATION / INSTRUCTIONS:  BG monitoring instructions: Patient is instructed to check her blood sugars *** times a day, ***.  Call [facility] if: BG persistently < 70 or > 300. . I reviewed the Rule of 15 for the treatment of hypoglycemia in detail with the patient. Literature supplied.   2) Diabetic complications:   Eye: Does *** have known diabetic retinopathy.   Neuro/ Feet: Does *** have known diabetic peripheral neuropathy.  Renal: Patient does *** have known baseline CKD. She is *** on an ACEI/ARB at present.Check urine albumin/creatinine ratio yearly starting at time of diagnosis. If albuminuria is positive, treatment is geared toward better glucose, blood pressure control and use of ACE inhibitors or ARBs. Monitor electrolytes and creatinine once to twice yearly.   3) Lipids: Patient is *** on a statin.    4) Hypertension:  ***  at goal of < 140/90 mmHg.       Signed electronically by: Mack Guise, MD  [facility]  [facility] [address] [place], [place] [address] Phone: [phone] FAX: [phone]   CC: Horald Pollen, MD [place] [address] Phone: [phone]  Fax: [phone]    Return to Endocrinology clinic as below: Future Appointments  Date Time Provider [place]  11/25/2018  9:10 AM Meilani Edmundson, Melanie Crazier, MD [facility] None  01/20/2019  8:20 AM Horald Pollen, MD [facility]

## 2018-12-01 ENCOUNTER — Other Ambulatory Visit: Payer: Self-pay

## 2018-12-03 ENCOUNTER — Encounter: Payer: Self-pay | Admitting: Internal Medicine

## 2018-12-03 ENCOUNTER — Other Ambulatory Visit: Payer: Self-pay

## 2018-12-03 ENCOUNTER — Ambulatory Visit (INDEPENDENT_AMBULATORY_CARE_PROVIDER_SITE_OTHER): Payer: PRIVATE HEALTH INSURANCE | Admitting: Internal Medicine

## 2018-12-03 VITALS — BP 118/68 | HR 88 | Temp 97.7°F | Ht 69.0 in | Wt 173.0 lb

## 2018-12-03 DIAGNOSIS — E1165 Type 2 diabetes mellitus with hyperglycemia: Secondary | ICD-10-CM | POA: Diagnosis not present

## 2018-12-03 DIAGNOSIS — E1142 Type 2 diabetes mellitus with diabetic polyneuropathy: Secondary | ICD-10-CM | POA: Diagnosis not present

## 2018-12-03 LAB — MICROALBUMIN / CREATININE URINE RATIO
Creatinine,U: 48.9 mg/dL
Microalb Creat Ratio: 15.3 mg/g (ref 0.0–30.0)
Microalb, Ur: 7.5 mg/dL — ABNORMAL HIGH (ref 0.0–1.9)

## 2018-12-03 LAB — GLUCOSE, POCT (MANUAL RESULT ENTRY): POC Glucose: 425 mg/dl — AB (ref 70–99)

## 2018-12-03 MED ORDER — GLIPIZIDE 5 MG PO TABS: 5.0000 mg | ORAL_TABLET | Freq: Two times a day (BID) | ORAL | 3 refills | Status: DC

## 2018-12-03 MED ORDER — ONETOUCH VERIO VI STRP: ORAL_STRIP | 12 refills | Status: DC

## 2018-12-03 NOTE — Progress Notes (Signed)
Name: Melinda Stone  MRN/ DOB: [id number], [birth year]   Age/ Sex: 38 y.o., female    PCP: Georgina QuintSagardia, Miguel Jose, MD   Reason for Endocrinology Evaluation: Type 2 Diabetes Mellitus     Date of Initial Endocrinology Visit: 12/03/2018     PATIENT IDENTIFIER: Melinda Stone is a 38 y.o. female with a past medical history of T2DM. The patient presented for initial endocrinology clinic visit on 12/03/2018 for consultative assistance with her diabetes management.    HPI: Melinda Stone was    Diagnosed with DM since the age of 38  Prior Medications tried/Intolerance: Trulicity- cost, Metformin- hypoglycemia/stomach issues Currently checking blood sugars 0  Hypoglycemia episodes : no            Hemoglobin A1c has ranged from 7.0% in 04/2018, peaking at 7.8% in 10/2018 Patient required assistance for hypoglycemia: no Patient has required hospitalization within the last 1 year from hyper or hypoglycemia:no   In terms of diet, the patient eats 3 meals a day, snacks 3 x a day . Drinks sweet tea   Works at [employer] different shifts  Lives with kids 11 and 8 yrs of age   HOME DIABETES REGIMEN: Trulicity 0.75 mg weekly - couldn't afford Jardiance 10 mg daily      Statin: no ACE-I/ARB: no Prior Diabetic Education: no   METER DOWNLOAD SUMMARY: Did not bring     DIABETIC COMPLICATIONS: Microvascular complications:    Denies: CKD, retinopathy, neuropathy   Last eye exam: Completed 2018  Macrovascular complications:    Denies: CAD, PVD, CVA   PAST HISTORY: Past Medical History:  Past Medical History:  Diagnosis Date  . Diabetes mellitus without complication (HCC)   . Ovarian cyst    Past Surgical History:  Past Surgical History:  Procedure Laterality Date  . CESAREAN SECTION    . CHOLECYSTECTOMY  2004  . TUBAL LIGATION  10/19/2010      Social History:  reports that she has been smoking. She has never used smokeless tobacco. She reports current  alcohol use. She reports current drug use. Drug: Marijuana. Family History:  Family History  Problem Relation Age of Onset  . Diabetes Mother   . Hypertension Mother   . Stroke Mother      HOME MEDICATIONS: Allergies as of 12/03/2018   No Known Allergies     Medication List       Accurate as of December 03, 2018  9:09 AM. If you have any questions, ask your nurse or doctor.        acetaminophen 500 MG tablet Commonly known as: TYLENOL Take 2 tablets (1,000 mg total) by mouth every 8 (eight) hours as needed.   empagliflozin 10 MG Tabs tablet Commonly known as: JARDIANCE Take 10 mg by mouth daily.   Excedrin Migraine 250-250-65 MG tablet Generic drug: aspirin-acetaminophen-caffeine Take 2 tablets by mouth every 6 (six) hours as needed for headache.   lidocaine 2 % solution Commonly known as: XYLOCAINE Use as directed 15 mLs in the mouth or throat as needed for mouth pain.   Trulicity 0.75 MG/0.5ML Sopn Generic drug: Dulaglutide Inject 0.75 mg into the skin once a week.        ALLERGIES: No Known Allergies   REVIEW OF SYSTEMS: A comprehensive ROS was conducted with the patient and is negative except as per HPI and below:  Review of Systems  Gastrointestinal: Negative for diarrhea and nausea.  Genitourinary: Negative for frequency.  Neurological: Positive for tingling.  Negative for tremors.  Endo/Heme/Allergies: Negative for polydipsia.  Psychiatric/Behavioral: Negative for depression. The patient is not nervous/anxious.       OBJECTIVE:   VITAL SIGNS: BP 118/68 (BP Location: Left Arm, Patient Position: Sitting, Cuff Size: Large)   Pulse 88   Temp 97.7 F (36.5 C)   Ht 5\' 9"  (1.753 m)   Wt 173 lb (78.5 kg)   SpO2 98%   BMI 25.55 kg/m    PHYSICAL EXAM:  General: Pt appears well and is in NAD  Hydration: Well-hydrated with moist mucous membranes and good skin turgor  HEENT: Head: Unremarkable with poor dentition. Oropharynx clear without exudate.   Eyes: External eye exam normal without stare, lid lag or exophthalmos.  EOM intact.   Neck: General: Supple without adenopathy or carotid bruits. Thyroid: Thyroid size normal.  No goiter or nodules appreciated. No thyroid bruit.  Lungs: Clear with good BS bilat with no rales, rhonchi, or wheezes  Heart: RRR with normal S1 and S2 and no gallops; no murmurs; no rub  Abdomen: Normoactive bowel sounds, soft, nontender, without masses or organomegaly palpable  Extremities:  Lower extremities - No pretibial edema. No lesions.  Skin: Normal texture and temperature to palpation. No rash noted. No Acanthosis nigricans/skin tags. No lipohypertrophy.  Neuro: MS is good with appropriate affect, pt is alert and Ox3    DM foot exam: 12/03/18  The skin of the feet is intact without sores or ulcerations. The pedal pulses are 2+ on right and 2+ on left. The sensation is decreased to a screening 5.07, 10 gram monofilament bilaterally   DATA REVIEWED:  Lab Results  Component Value Date   HGBA1C 7.8 (A) 10/20/2018   HGBA1C 7.0 (A) 05/15/2018   Lab Results  Component Value Date   LDLCALC 28 05/15/2018   CREATININE 0.54 (L) 10/20/2018   No results found for: MICRALBCREAT  Lab Results  Component Value Date   CHOL CANCELED 10/20/2018   HDL CANCELED 10/20/2018   LDLCALC 28 05/15/2018   TRIG CANCELED 10/20/2018   CHOLHDL 1.5 05/15/2018      Results for Broner, Egan L (MRN [id number]) as of 12/03/2018 09:10  Ref. Range 10/20/2018 09:57  Sodium Latest Ref Range: 134 - 144 mmol/L 135  Potassium Latest Ref Range: 3.5 - 5.2 mmol/L 4.0  Chloride Latest Ref Range: 96 - 106 mmol/L 98  CO2 Latest Ref Range: 20 - 29 mmol/L 22  Glucose Latest Ref Range: 65 - 99 mg/dL 338 (H)  BUN Latest Ref Range: 6 - 20 mg/dL 10  Creatinine Latest Ref Range: 0.57 - 1.00 mg/dL 0.54 (L)  Calcium Latest Ref Range: 8.7 - 10.2 mg/dL 8.7  BUN/Creatinine Ratio Latest Ref Range: 9 - 23  19  Alkaline Phosphatase Latest  Ref Range: 39 - 117 IU/L 85  Albumin Latest Ref Range: 3.8 - 4.8 g/dL 4.2  Albumin/Globulin Ratio Latest Ref Range: 1.2 - 2.2  1.6  AST Latest Ref Range: 0 - 40 IU/L 8  ALT Latest Ref Range: 0 - 32 IU/L 7  Total Protein Latest Ref Range: 6.0 - 8.5 g/dL 6.8  Total Bilirubin Latest Ref Range: 0.0 - 1.2 mg/dL 1.7 (H)  GFR, Est Non African American Latest Ref Range: >59 mL/min/1.73 122  GFR, Est African American Latest Ref Range: >59 mL/min/1.73 140   Results for [facility], Kaliope L (MRN [id number]) as of 12/03/2018 14:04  Ref. Range 12/03/2018 09:04  POC Glucose Latest Ref Range: 70 - 99 mg/dl 425 (A)  ASSESSMENT / PLAN / RECOMMENDATIONS:   1) Type 2 Diabetes Mellitus, Suboptimally controlled, With Neuropathic complications - Most recent A1c of 7.8 %. Goal A1c < 7.0 %.    - I have discussed with the patient the pathophysiology of diabetes. We went over the natural progression of the disease. We talked about both insulin resistance and insulin deficiency. We stressed the importance of lifestyle changes including diet and exercise. I explained the complications associated with diabetes including retinopathy, nephropathy, neuropathy as well as increased risk of cardiovascular disease. We went over the benefit seen with glycemic control.   -I explained to the patient that diabetic patients are at higher than normal risk for amputations.  - We discussed the importance of lifestyle changes with avoiding sugar-sweetened beverages and avoiding snacks when possible, we discussed low CHO snacks. Pt declined CDE referral - Trulicity is cost prohibitive  - Discussed adding Glipizide which she agreed to, discussed the risk of hypoglycemia, weight gain and the importance of taking it with meals.  - We also discussed the importance of glucose checks at home and the importance of having that data to me.   MEDICATIONS:  Continue Jardiance 10 mg daily   Start Glipizide 5 mg BID  EDUCATION /  INSTRUCTIONS:  BG monitoring instructions: Patient is instructed to check her blood sugars 2 times a day, fasting and supper.  Call [facility] if: BG persistently < 70 or > 300. . I reviewed the Rule of 15 for the treatment of hypoglycemia in detail with the patient. Literature supplied.   2) Diabetic complications:   Eye: Does not have known diabetic retinopathy.   Neuro/ Feet: Does not have known diabetic peripheral neuropathy.  Renal: Patient does not have known baseline CKD. She is not on an ACEI/ARB at present.   3) Lipids: No indication for statin therapy until age 31.     F/u in 3 months     Signed electronically by: Lyndle Herrlich, MD  [facility]  [facility] [address] [place], [place] [address] Phone: [phone] FAX: [phone]   CC: Georgina Quint, MD [address] [place] [address] Phone: [phone]  Fax: [phone]    Return to Endocrinology clinic as below: Future Appointments  Date Time Provider Department Center  01/20/2019  8:20 AM Georgina Quint, MD PCP-PCP [facility]

## 2018-12-03 NOTE — Patient Instructions (Addendum)
-   Start Glipizide 1 tablet before Breakfast and 1 tablet before Supper   - Continue Jardiance 10 mg daily     Choose healthy, lower carb lower calorie snacks: toss salad, cooked vegetables, cottage cheese, peanut butter, low fat cheese / string cheese, lower sodium deli meat, tuna salad or chicken salad     HOW TO TREAT LOW BLOOD SUGARS (Blood sugar LESS THAN 70 MG/DL)  Please follow the RULE OF 15 for the treatment of hypoglycemia treatment (when your (blood sugars are less than 70 mg/dL)    STEP 1: Take 15 grams of carbohydrates when your blood sugar is low, which includes:   3-4 GLUCOSE TABS  OR  3-4 OZ OF JUICE OR REGULAR SODA OR  ONE TUBE OF GLUCOSE GEL     STEP 2: RECHECK blood sugar in 15 MINUTES STEP 3: If your blood sugar is still low at the 15 minute recheck --> then, go back to STEP 1 and treat AGAIN with another 15 grams of carbohydrates.

## 2018-12-25 ENCOUNTER — Ambulatory Visit (INDEPENDENT_AMBULATORY_CARE_PROVIDER_SITE_OTHER): Payer: PRIVATE HEALTH INSURANCE | Admitting: Emergency Medicine

## 2018-12-25 ENCOUNTER — Encounter: Payer: Self-pay | Admitting: Emergency Medicine

## 2018-12-25 ENCOUNTER — Other Ambulatory Visit: Payer: Self-pay

## 2018-12-25 VITALS — BP 95/64 | HR 96 | Temp 98.1°F | Resp 16 | Ht 69.0 in | Wt 178.0 lb

## 2018-12-25 DIAGNOSIS — E1142 Type 2 diabetes mellitus with diabetic polyneuropathy: Secondary | ICD-10-CM

## 2018-12-25 DIAGNOSIS — L298 Other pruritus: Secondary | ICD-10-CM

## 2018-12-25 DIAGNOSIS — G894 Chronic pain syndrome: Secondary | ICD-10-CM | POA: Diagnosis not present

## 2018-12-25 NOTE — Patient Instructions (Addendum)
   If you have lab work done today you will be contacted with your lab results within the next 2 weeks.  If you have not heard from us then please contact us. The fastest way to get your results is to register for My Chart.   IF you received an x-ray today, you will receive an invoice from [facility]. Please contact [facility] at [phone] with questions or concerns regarding your invoice.   IF you received labwork today, you will receive an invoice from [facility]. Please contact [facility] at [phone] with questions or concerns regarding your invoice.   Our billing staff will not be able to assist you with questions regarding bills from these companies.  You will be contacted with the lab results as soon as they are available. The fastest way to get your results is to activate your My Chart account. Instructions are located on the last page of this paperwork. If you have not heard from us regarding the results in 2 weeks, please contact this office.     Diabetes Mellitus and Nutrition, Adult When you have diabetes (diabetes mellitus), it is very important to have healthy eating habits because your blood sugar (glucose) levels are greatly affected by what you eat and drink. Eating healthy foods in the appropriate amounts, at about the same times every day, can help you:  Control your blood glucose.  Lower your risk of heart disease.  Improve your blood pressure.  Reach or maintain a healthy weight. Every person with diabetes is different, and each person has different needs for a meal plan. Your health care provider may recommend that you work with a diet and nutrition specialist (dietitian) to make a meal plan that is best for you. Your meal plan may vary depending on factors such as:  The calories you need.  The medicines you take.  Your weight.  Your blood glucose, blood pressure, and cholesterol levels.  Your activity level.  Other health conditions  you have, such as heart or kidney disease. How do carbohydrates affect me? Carbohydrates, also called carbs, affect your blood glucose level more than any other type of food. Eating carbs naturally raises the amount of glucose in your blood. Carb counting is a method for keeping track of how many carbs you eat. Counting carbs is important to keep your blood glucose at a healthy level, especially if you use insulin or take certain oral diabetes medicines. It is important to know how many carbs you can safely have in each meal. This is different for every person. Your dietitian can help you calculate how many carbs you should have at each meal and for each snack. Foods that contain carbs include:  Bread, cereal, rice, pasta, and crackers.  Potatoes and corn.  Peas, beans, and lentils.  Milk and yogurt.  Fruit and juice.  Desserts, such as cakes, cookies, ice cream, and candy. How does alcohol affect me? Alcohol can cause a sudden decrease in blood glucose (hypoglycemia), especially if you use insulin or take certain oral diabetes medicines. Hypoglycemia can be a life-threatening condition. Symptoms of hypoglycemia (sleepiness, dizziness, and confusion) are similar to symptoms of having too much alcohol. If your health care provider says that alcohol is safe for you, follow these guidelines:  Limit alcohol intake to no more than 1 drink per day for nonpregnant women and 2 drinks per day for men. One drink equals 12 oz of beer, 5 oz of wine, or 1 oz of hard liquor.    Do not drink on an empty stomach.  Keep yourself hydrated with water, diet soda, or unsweetened iced tea.  Keep in mind that regular soda, juice, and other mixers may contain a lot of sugar and must be counted as carbs. What are tips for following this plan?  Reading food labels  Start by checking the serving size on the "Nutrition Facts" label of packaged foods and drinks. The amount of calories, carbs, fats, and other  nutrients listed on the label is based on one serving of the item. Many items contain more than one serving per package.  Check the total grams (g) of carbs in one serving. You can calculate the number of servings of carbs in one serving by dividing the total carbs by 15. For example, if a food has 30 g of total carbs, it would be equal to 2 servings of carbs.  Check the number of grams (g) of saturated and trans fats in one serving. Choose foods that have low or no amount of these fats.  Check the number of milligrams (mg) of salt (sodium) in one serving. Most people should limit total sodium intake to less than 2,300 mg per day.  Always check the nutrition information of foods labeled as "low-fat" or "nonfat". These foods may be higher in added sugar or refined carbs and should be avoided.  Talk to your dietitian to identify your daily goals for nutrients listed on the label. Shopping  Avoid buying canned, premade, or processed foods. These foods tend to be high in fat, sodium, and added sugar.  Shop around the outside edge of the grocery store. This includes fresh fruits and vegetables, bulk grains, fresh meats, and fresh dairy. Cooking  Use low-heat cooking methods, such as baking, instead of high-heat cooking methods like deep frying.  Cook using healthy oils, such as olive, canola, or sunflower oil.  Avoid cooking with butter, cream, or high-fat meats. Meal planning  Eat meals and snacks regularly, preferably at the same times every day. Avoid going long periods of time without eating.  Eat foods high in fiber, such as fresh fruits, vegetables, beans, and whole grains. Talk to your dietitian about how many servings of carbs you can eat at each meal.  Eat 4-6 ounces (oz) of lean protein each day, such as lean meat, chicken, fish, eggs, or tofu. One oz of lean protein is equal to: ? 1 oz of meat, chicken, or fish. ? 1 egg. ?  cup of tofu.  Eat some foods each day that contain  healthy fats, such as avocado, nuts, seeds, and fish. Lifestyle  Check your blood glucose regularly.  Exercise regularly as told by your health care provider. This may include: ? 150 minutes of moderate-intensity or vigorous-intensity exercise each week. This could be brisk walking, biking, or water aerobics. ? Stretching and doing strength exercises, such as yoga or weightlifting, at least 2 times a week.  Take medicines as told by your health care provider.  Do not use any products that contain nicotine or tobacco, such as cigarettes and e-cigarettes. If you need help quitting, ask your health care provider.  Work with a counselor or diabetes educator to identify strategies to manage stress and any emotional and social challenges. Questions to ask a health care provider  Do I need to meet with a diabetes educator?  Do I need to meet with a dietitian?  What number can I call if I have questions?  When are the best times to   check my blood glucose? Where to find more information:  American Diabetes Association: diabetes.org  Academy of Nutrition and Dietetics: www.eatright.[employer] of Diabetes and Digestive and Kidney Diseases (NIH): [employer] Summary  A healthy meal plan will help you control your blood glucose and maintain a healthy lifestyle.  Working with a diet and nutrition specialist (dietitian) can help you make a meal plan that is best for you.  Keep in mind that carbohydrates (carbs) and alcohol have immediate effects on your blood glucose levels. It is important to count carbs and to use alcohol carefully. This information is not intended to replace advice given to you by your health care provider. Make sure you discuss any questions you have with your health care provider. Document Released: 11/30/2004 Document Revised: 02/15/2017 Document Reviewed: 04/09/2016 Elsevier Patient Education  2020 [employer].  Neuropathic Pain Neuropathic pain  is pain caused by damage to the nerves that are responsible for certain sensations in your body (sensory nerves). The pain can be caused by:  Damage to the sensory nerves that send signals to your spinal cord and brain (peripheral nervous system).  Damage to the sensory nerves in your brain or spinal cord (central nervous system). Neuropathic pain can make you more sensitive to pain. Even a minor sensation can feel very painful. This is usually a long-term condition that can be difficult to treat. The type of pain differs from person to person. It may:  Start suddenly (acute), or it may develop slowly and last for a long time (chronic).  Come and go as damaged nerves heal, or it may stay at the same level for years.  Cause emotional distress, loss of sleep, and a lower quality of life. What are the causes? The most common cause of this condition is diabetes. Many other diseases and conditions can also cause neuropathic pain. Causes of neuropathic pain can be classified as:  Toxic. This is caused by medicines and chemicals. The most common cause of toxic neuropathic pain is damage from cancer treatments (chemotherapy).  Metabolic. This can be caused by: ? Diabetes. This is the most common disease that damages the nerves. ? Lack of vitamin B from long-term alcohol abuse.  Traumatic. Any injury that cuts, crushes, or stretches a nerve can cause damage and pain. A common example is feeling pain after losing an arm or leg (phantom limb pain).  Compression-related. If a sensory nerve gets trapped or compressed for a long period of time, the blood supply to the nerve can be cut off.  Vascular. Many blood vessel diseases can cause neuropathic pain by decreasing blood supply and oxygen to nerves.  Autoimmune. This type of pain results from diseases in which the body's defense system (immune system) mistakenly attacks sensory nerves. Examples of autoimmune diseases that can cause neuropathic pain  include lupus and multiple sclerosis.  Infectious. Many types of viral infections can damage sensory nerves and cause pain. Shingles infection is a common cause of this type of pain.  Inherited. Neuropathic pain can be a symptom of many diseases that are passed down through families (genetic). What increases the risk? You are more likely to develop this condition if:  You have diabetes.  You smoke.  You drink too much alcohol.  You are taking certain medicines, including medicines that kill cancer cells (chemotherapy) or that treat immune system disorders. What are the signs or symptoms? The main symptom is pain. Neuropathic pain is often described as:  Burning.  Shock-like.  Stinging.  Hot or cold.  Itching. How is this diagnosed? No single test can diagnose neuropathic pain. It is diagnosed based on:  Physical exam and your symptoms. Your health care provider will ask you about your pain. You may be asked to use a pain scale to describe how bad your pain is.  Tests. These may be done to see if you have a high sensitivity to pain and to help find the cause and location of any sensory nerve damage. They include: ? Nerve conduction studies to test how well nerve signals travel through your sensory nerves (electrodiagnostic testing). ? Stimulating your sensory nerves through electrodes on your skin and measuring the response in your spinal cord and brain (somatosensory evoked potential).  Imaging studies, such as: ? X-rays. ? CT scan. ? MRI. How is this treated? Treatment for neuropathic pain may change over time. You may need to try different treatment options or a combination of treatments. Some options include:  Treating the underlying cause of the neuropathy, such as diabetes, kidney disease, or vitamin deficiencies.  Stopping medicines that can cause neuropathy, such as chemotherapy.  Medicine to relieve pain. Medicines may include: ? Prescription or  over-the-counter pain medicine. ? Anti-seizure medicine. ? Antidepressant medicines. ? Pain-relieving patches that are applied to painful areas of skin. ? A medicine to numb the area (local anesthetic), which can be injected as a nerve block.  Transcutaneous nerve stimulation. This uses electrical currents to block painful nerve signals. The treatment is painless.  Alternative treatments, such as: ? Acupuncture. ? Meditation. ? Massage. ? Physical therapy. ? Pain management programs. ? Counseling. Follow these instructions at home: Medicines   Take over-the-counter and prescription medicines only as told by your health care provider.  Do not drive or use heavy machinery while taking prescription pain medicine.  If you are taking prescription pain medicine, take actions to prevent or treat constipation. Your health care provider may recommend that you: ? Drink enough fluid to keep your urine pale yellow. ? Eat foods that are high in fiber, such as fresh fruits and vegetables, whole grains, and beans. ? Limit foods that are high in fat and processed sugars, such as fried or sweet foods. ? Take an over-the-counter or prescription medicine for constipation. Lifestyle   Have a good support system at home.  Consider joining a chronic pain support group.  Do not use any products that contain nicotine or tobacco, such as cigarettes and e-cigarettes. If you need help quitting, ask your health care provider.  Do not drink alcohol. General instructions  Learn as much as you can about your condition.  Work closely with all your health care providers to find the treatment plan that works best for you.  Ask your health care provider what activities are safe for you.  Keep all follow-up visits as told by your health care provider. This is important. Contact a health care provider if:  Your pain treatments are not working.  You are having side effects from your medicines.  You  are struggling with tiredness (fatigue), mood changes, depression, or anxiety. Summary  Neuropathic pain is pain caused by damage to the nerves that are responsible for certain sensations in your body (sensory nerves).  Neuropathic pain may come and go as damaged nerves heal, or it may stay at the same level for years.  Neuropathic pain is usually a long-term condition that can be difficult to treat. Consider joining a chronic pain support group. This information is not intended to  replace advice given to you by your health care provider. Make sure you discuss any questions you have with your health care provider. Document Released: 12/01/2003 Document Revised: 06/26/2018 Document Reviewed: 03/22/2017 Elsevier Patient Education  2020 [employer]

## 2018-12-25 NOTE — Assessment & Plan Note (Signed)
Continue advice of endocrinologist.  Glipizide 5 mg twice a day.  Follow-up as scheduled.  Will refer to pain management clinic for diabetic polyneuropathy chronic pain management.

## 2018-12-25 NOTE — Progress Notes (Signed)
Melinda Stone is a 38 y.o. female here for complaint of generalized pain. Patient has a history of diabetic neuropathy, this is a recent diagnosis by an [place]ocrinologist in [place]. She states that she has a generalized sharp burning pain. This is a chronic problem she has been having this symptoms for months but states that she is in [place] visiting a friend and could not sleep tonight secondary to the pain. She reports good blood glucose control recently, highest being in the low 200s. She denies any nausea or vomiting. No abdominal pain. No chest pain pressure heaviness or tightness. No shortness of breath. No fevers or chills. No other complaints or symptoms today. Endocrinology evaluation on 12/03/2018:  ASSESSMENT / PLAN / RECOMMENDATIONS:   1) Type 2 Diabetes Mellitus, Suboptimally controlled, With Neuropathic complications - Most recent A1c of 7.8 %. Goal A1c < 7.0 %.    - I have discussed with the patient the pathophysiology of diabetes. We went over the natural progression of the disease. We talked about both insulin resistance and insulin deficiency. We stressed the importance of lifestyle changes including diet and exercise. I explained the complications associated with diabetes including retinopathy, nephropathy, neuropathy as well as increased risk of cardiovascular disease. We went over the benefit seen with glycemic control.   -I explained to the patient that diabetic patients are at higher than normal risk for amputations.  - We discussed the importance of lifestyle changes with avoiding sugar-sweetened beverages and avoiding snacks when possible, we discussed low CHO snacks. Pt declined CDE referral - Trulicity is cost prohibitive  - Discussed adding Glipizide which she agreed to, discussed the risk of hypoglycemia, weight gain and the importance of taking it with meals.  - We also discussed the importance of glucose checks at home and the importance of having  that data to me.   MEDICATIONS:  Continue Jardiance 10 mg daily   Start Glipizide 5 mg BID  EDUCATION / INSTRUCTIONS:  BG monitoring instructions: Patient is instructed to check her blood sugars 2 times a day, fasting and supper.  Call [facility] if: BG persistently < 70 or > 300.  I reviewed the Rule of 15 for the treatment of hypoglycemia in detail with the patient. Literature supplied.   2) Diabetic complications:   Eye: Does not have known diabetic retinopathy.   Neuro/ Feet: Does not have known diabetic peripheral neuropathy.  Renal: Patient does not have known baseline CKD. She is not on an ACEI/ARB at present.   3) Lipids: No indication for statin therapy until age 38.     F/u in 3 months     Signed electronically by: Lyndle HerrlichAbby Jaralla Shamleffer, MD  [facility]  [facility] [address] [place], [place] [address] Phone: [phone] FAX: [phone]   CC: Georgina QuintSagardia, Lorena Benham Jose, MD   Lab Results  Component Value Date   HGBA1C 7.8 (A) 10/20/2018   Lab Results  Component Value Date   CREATININE 0.54 (L) 10/20/2018   BUN 10 10/20/2018   NA 135 10/20/2018   K 4.0 10/20/2018   CL 98 10/20/2018   CO2 22 10/20/2018   Melinda Stone 38 y.o.   Chief Complaint  Patient presents with  . Leg Pain    AND ARMS - per patient she [place] 10[place]    HISTORY OF PRESENT ILLNESS: This is a 38 y.o. female complaining of generalized pain.  Seen in the emergency room 2  days ago.  ED note reviewed.  Has h/o diabetic polyneuropathy. Needs pain management referral.  Has been diabetic for 18 years.  Sometime in the past she was on pain management and was started on gabapentin for 6 to 12 months with minimal results.  Was recently seen also by an endocrinologist.  Trulicity and Jardiance too expensive.  Intolerant to metformin.  Was started on glipizide 5 mg twice a day.  Has  follow-up appointment with endocrinologist next month.  No other complaints or medical concerns today.  HPI   Prior to Admission medications   Medication Sig Start Date End Date Taking? Authorizing Provider  aspirin-acetaminophen-caffeine (EXCEDRIN MIGRAINE) [phone] MG tablet Take 2 tablets by mouth every 6 (six) hours as needed for headache. 10/20/18  Yes Galen Russman, Eilleen Kempf, MD  glipiZIDE (GLUCOTROL) 5 MG tablet Take 1 tablet (5 mg total) by mouth 2 (two) times daily before a meal. 12/03/18  Yes Shamleffer, Konrad Dolores, MD  empagliflozin (JARDIANCE) 10 MG TABS tablet Take 10 mg by mouth daily. Patient not taking: Reported on 12/25/2018 10/20/18 01/18/19  Georgina Quint, MD  glucose blood [facility] VERIO) test strip 2x daily 12/03/18   Shamleffer, Konrad Dolores, MD    Not on File  Patient Active Problem List   Diagnosis Date Noted  . Type 2 diabetes mellitus with diabetic polyneuropathy, without long-term current use of insulin (HCC) 12/03/2018  . Type 2 diabetes mellitus with hyperglycemia, without long-term current use of insulin (HCC) 05/15/2018    Past Medical History:  Diagnosis Date  . Diabetes mellitus without complication (HCC)   . Ovarian cyst     Past Surgical History:  Procedure Laterality Date  . CESAREAN SECTION    . CHOLECYSTECTOMY  2004  . TUBAL LIGATION  10/19/2010    Social History   Socioeconomic History  . Marital status: Single    Spouse name: Not on file  . Number of children: Not on file  . Years of education: Not on file  . Highest education level: Not on file  Occupational History  . Not on file  Social Needs  . Financial resource strain: Not on file  . Food insecurity    Worry: Not on file    Inability: Not on file  . Transportation needs    Medical: Not on file    Non-medical: Not on file  Tobacco Use  . Smoking status: Current Some Day Smoker  . Smokeless tobacco: Never Used  Substance and Sexual Activity  . Alcohol use:  Yes    Frequency: Never  . Drug use: Yes    Types: Marijuana  . Sexual activity: Not on file  Lifestyle  . Physical activity    Days per week: Not on file    Minutes per session: Not on file  . Stress: Not on file  Relationships  . Social Musician on phone: Not on file    Gets together: Not on file    Attends religious service: Not on file    Active member of club or organization: Not on file    Attends meetings of clubs or organizations: Not on file    Relationship status: Not on file  . Intimate partner violence    Fear of current or ex partner: Not on file    Emotionally abused: Not on file    Physically abused: Not on file    Forced sexual activity: Not on file  Other Topics Concern  . Not on file  Social History Narrative  . Not on file    Family History  Problem Relation Age of Onset  . Diabetes Mother   . Hypertension Mother   . Stroke Mother      Review of Systems  Constitutional: Negative.  Negative for chills, fever and weight loss.  HENT: Negative.  Negative for congestion and sore throat.   Eyes: Negative.   Respiratory: Negative.  Negative for cough and shortness of breath.   Cardiovascular: Negative.  Negative for chest pain and palpitations.  Gastrointestinal: Negative.  Negative for abdominal pain, diarrhea, nausea and vomiting.  Musculoskeletal: Negative.  Negative for back pain and neck pain.  Skin: Positive for rash.  Neurological: Negative.  Negative for dizziness and headaches.  Endo/Heme/Allergies: Negative.   All other systems reviewed and are negative.   Vitals:   12/25/18 0857  BP: 95/64  Pulse: 96  Resp: 16  Temp: 98.1 F (36.7 C)  SpO2: 100%    Physical Exam Vitals signs reviewed.  Constitutional:      Appearance: Normal appearance.  HENT:     Head: Normocephalic.  Eyes:     Extraocular Movements: Extraocular movements intact.     Pupils: Pupils are equal, round, and reactive to light.  Neck:      Musculoskeletal: Normal range of motion.  Cardiovascular:     Rate and Rhythm: Normal rate and regular rhythm.     Heart sounds: Normal heart sounds.  Pulmonary:     Effort: Pulmonary effort is normal.     Breath sounds: Normal breath sounds.  Musculoskeletal: Normal range of motion.  Skin:    General: Skin is warm and dry.     Capillary Refill: Capillary refill takes less than 2 seconds.  Neurological:     General: No focal deficit present.     Mental Status: She is alert and oriented to person, place, and time.  Psychiatric:        Mood and Affect: Mood normal.        Behavior: Behavior normal.      ASSESSMENT & PLAN: Type 2 diabetes mellitus with diabetic polyneuropathy, without long-term current use of insulin (HCC) Continue advice of endocrinologist.  Glipizide 5 mg twice a day.  Follow-up as scheduled.  Will refer to pain management clinic for diabetic polyneuropathy chronic pain management.   Melinda Stone was seen today for leg pain.  Diagnoses and all orders for this visit:  Type 2 diabetes mellitus with diabetic polyneuropathy, without long-term current use of insulin (HCC)  Chronic pruritic rash in adult -     Ambulatory referral to Dermatology  Chronic pain syndrome -     Ambulatory referral to Pain Clinic    Patient Instructions       If you have lab work done today you will be contacted with your lab results within the next 2 weeks.  If you have not heard from [place] then please contact us. The fastest way to get your results is to register for My Chart.   IF you received an x-ray today, you will receive an invoice from [facility]. Please contact [facility] at [phone] with questions or concerns regarding your invoice.   IF you received labwork today, you will receive an invoice from [facility]. Please contact [facility] at [phone] with questions or concerns regarding your invoice.   Our billing staff will not be able to assist you  with questions regarding bills from these companies.  You will be contacted with the lab results  as soon as they are available. The fastest way to get your results is to activate your My Chart account. Instructions are located on the last page of this paperwork. If you have not heard from [place] regarding the results in 2 weeks, please contact this office.     Diabetes Mellitus and Nutrition, Adult When you have diabetes (diabetes mellitus), it is very important to have healthy eating habits because your blood sugar (glucose) levels are greatly affected by what you eat and drink. Eating healthy foods in the appropriate amounts, at about the same times every day, can help you:  Control your blood glucose.  Lower your risk of heart disease.  Improve your blood pressure.  Reach or maintain a healthy weight. Every person with diabetes is different, and each person has different needs for a meal plan. Your health care provider may recommend that you work with a diet and nutrition specialist (dietitian) to make a meal plan that is best for you. Your meal plan may vary depending on factors such as:  The calories you need.  The medicines you take.  Your weight.  Your blood glucose, blood pressure, and cholesterol levels.  Your activity level.  Other health conditions you have, such as heart or kidney disease. How do carbohydrates affect me? Carbohydrates, also called carbs, affect your blood glucose level more than any other type of food. Eating carbs naturally raises the amount of glucose in your blood. Carb counting is a method for keeping track of how many carbs you eat. Counting carbs is important to keep your blood glucose at a healthy level, especially if you use insulin or take certain oral diabetes medicines. It is important to know how many carbs you can safely have in each meal. This is different for every person. Your dietitian can help you calculate how many carbs you should have at  each meal and for each snack. Foods that contain carbs include:  Bread, cereal, rice, pasta, and crackers.  Potatoes and corn.  Peas, beans, and lentils.  Milk and yogurt.  Fruit and juice.  Desserts, such as cakes, cookies, ice cream, and candy. How does alcohol affect me? Alcohol can cause a sudden decrease in blood glucose (hypoglycemia), especially if you use insulin or take certain oral diabetes medicines. Hypoglycemia can be a life-threatening condition. Symptoms of hypoglycemia (sleepiness, dizziness, and confusion) are similar to symptoms of having too much alcohol. If your health care provider says that alcohol is safe for you, follow these guidelines:  Limit alcohol intake to no more than 1 drink per day for nonpregnant women and 2 drinks per day for men. One drink equals 12 oz of beer, 5 oz of wine, or 1 oz of hard liquor.  Do not drink on an empty stomach.  Keep yourself hydrated with water, diet soda, or unsweetened iced tea.  Keep in mind that regular soda, juice, and other mixers may contain a lot of sugar and must be counted as carbs. What are tips for following this plan?  Reading food labels  Start by checking the serving size on the "Nutrition Facts" label of packaged foods and drinks. The amount of calories, carbs, fats, and other nutrients listed on the label is based on one serving of the item. Many items contain more than one serving per package.  Check the total grams (g) of carbs in one serving. You can calculate the number of servings of carbs in one serving by dividing the total carbs by 15.  For example, if a food has 30 g of total carbs, it would be equal to 2 servings of carbs.  Check the number of grams (g) of saturated and trans fats in one serving. Choose foods that have low or no amount of these fats.  Check the number of milligrams (mg) of salt (sodium) in one serving. Most people should limit total sodium intake to less than 2,300 mg per day.   Always check the nutrition information of foods labeled as "low-fat" or "nonfat". These foods may be higher in added sugar or refined carbs and should be avoided.  Talk to your dietitian to identify your daily goals for nutrients listed on the label. Shopping  Avoid buying canned, premade, or processed foods. These foods tend to be high in fat, sodium, and added sugar.  Shop around the outside edge of the grocery store. This includes fresh fruits and vegetables, bulk grains, fresh meats, and fresh dairy. Cooking  Use low-heat cooking methods, such as baking, instead of high-heat cooking methods like deep frying.  Cook using healthy oils, such as olive, canola, or sunflower oil.  Avoid cooking with butter, cream, or high-fat meats. Meal planning  Eat meals and snacks regularly, preferably at the same times every day. Avoid going long periods of time without eating.  Eat foods high in fiber, such as fresh fruits, vegetables, beans, and whole grains. Talk to your dietitian about how many servings of carbs you can eat at each meal.  Eat 4-6 ounces (oz) of lean protein each day, such as lean meat, chicken, fish, eggs, or tofu. One oz of lean protein is equal to: ? 1 oz of meat, chicken, or fish. ? 1 egg. ?  cup of tofu.  Eat some foods each day that contain healthy fats, such as avocado, nuts, seeds, and fish. Lifestyle  Check your blood glucose regularly.  Exercise regularly as told by your health care provider. This may include: ? 150 minutes of moderate-intensity or vigorous-intensity exercise each week. This could be brisk walking, biking, or water aerobics. ? Stretching and doing strength exercises, such as yoga or weightlifting, at least 2 times a week.  Take medicines as told by your health care provider.  Do not use any products that contain nicotine or tobacco, such as cigarettes and e-cigarettes. If you need help quitting, ask your health care provider.  Work with a  Veterinary surgeon or diabetes educator to identify strategies to manage stress and any emotional and social challenges. Questions to ask a health care provider  Do I need to meet with a diabetes educator?  Do I need to meet with a dietitian?  What number can I call if I have questions?  When are the best times to check my blood glucose? Where to find more information:  American Diabetes Association: diabetes.org  Academy of Nutrition and Dietetics: www.eatright.[employer] of Diabetes and Digestive and Kidney Diseases (NIH): CarFlippers.tn Summary  A healthy meal plan will help you control your blood glucose and maintain a healthy lifestyle.  Working with a diet and nutrition specialist (dietitian) can help you make a meal plan that is best for you.  Keep in mind that carbohydrates (carbs) and alcohol have immediate effects on your blood glucose levels. It is important to count carbs and to use alcohol carefully. This information is not intended to replace advice given to you by your health care provider. Make sure you discuss any questions you have with your health care provider. Document  Released: 11/30/2004 Document Revised: 02/15/2017 Document Reviewed: 04/09/2016 Elsevier Patient Education  2020 Elsevier Inc.  Neuropathic Pain Neuropathic pain is pain caused by damage to the nerves that are responsible for certain sensations in your body (sensory nerves). The pain can be caused by:  Damage to the sensory nerves that send signals to your spinal cord and brain (peripheral nervous system).  Damage to the sensory nerves in your brain or spinal cord (central nervous system). Neuropathic pain can make you more sensitive to pain. Even a minor sensation can feel very painful. This is usually a long-term condition that can be difficult to treat. The type of pain differs from person to person. It may:  Start suddenly (acute), or it may develop slowly and last for a long time  (chronic).  Come and go as damaged nerves heal, or it may stay at the same level for years.  Cause emotional distress, loss of sleep, and a lower quality of life. What are the causes? The most common cause of this condition is diabetes. Many other diseases and conditions can also cause neuropathic pain. Causes of neuropathic pain can be classified as:  Toxic. This is caused by medicines and chemicals. The most common cause of toxic neuropathic pain is damage from cancer treatments (chemotherapy).  Metabolic. This can be caused by: ? Diabetes. This is the most common disease that damages the nerves. ? Lack of vitamin B from long-term alcohol abuse.  Traumatic. Any injury that cuts, crushes, or stretches a nerve can cause damage and pain. A common example is feeling pain after losing an arm or leg (phantom limb pain).  Compression-related. If a sensory nerve gets trapped or compressed for a long period of time, the blood supply to the nerve can be cut off.  Vascular. Many blood vessel diseases can cause neuropathic pain by decreasing blood supply and oxygen to nerves.  Autoimmune. This type of pain results from diseases in which the body's defense system (immune system) mistakenly attacks sensory nerves. Examples of autoimmune diseases that can cause neuropathic pain include lupus and multiple sclerosis.  Infectious. Many types of viral infections can damage sensory nerves and cause pain. Shingles infection is a common cause of this type of pain.  Inherited. Neuropathic pain can be a symptom of many diseases that are passed down through families (genetic). What increases the risk? You are more likely to develop this condition if:  You have diabetes.  You smoke.  You drink too much alcohol.  You are taking certain medicines, including medicines that kill cancer cells (chemotherapy) or that treat immune system disorders. What are the signs or symptoms? The main symptom is pain.  Neuropathic pain is often described as:  Burning.  Shock-like.  Stinging.  Hot or cold.  Itching. How is this diagnosed? No single test can diagnose neuropathic pain. It is diagnosed based on:  Physical exam and your symptoms. Your health care provider will ask you about your pain. You may be asked to use a pain scale to describe how bad your pain is.  Tests. These may be done to see if you have a high sensitivity to pain and to help find the cause and location of any sensory nerve damage. They include: ? Nerve conduction studies to test how well nerve signals travel through your sensory nerves (electrodiagnostic testing). ? Stimulating your sensory nerves through electrodes on your skin and measuring the response in your spinal cord and brain (somatosensory evoked potential).  Imaging studies, such as: ?  X-rays. ? CT scan. ? MRI. How is this treated? Treatment for neuropathic pain may change over time. You may need to try different treatment options or a combination of treatments. Some options include:  Treating the underlying cause of the neuropathy, such as diabetes, kidney disease, or vitamin deficiencies.  Stopping medicines that can cause neuropathy, such as chemotherapy.  Medicine to relieve pain. Medicines may include: ? Prescription or over-the-counter pain medicine. ? Anti-seizure medicine. ? Antidepressant medicines. ? Pain-relieving patches that are applied to painful areas of skin. ? A medicine to numb the area (local anesthetic), which can be injected as a nerve block.  Transcutaneous nerve stimulation. This uses electrical currents to block painful nerve signals. The treatment is painless.  Alternative treatments, such as: ? Acupuncture. ? Meditation. ? Massage. ? Physical therapy. ? Pain management programs. ? Counseling. Follow these instructions at home: Medicines   Take over-the-counter and prescription medicines only as told by your health care  provider.  Do not drive or use heavy machinery while taking prescription pain medicine.  If you are taking prescription pain medicine, take actions to prevent or treat constipation. Your health care provider may recommend that you: ? Drink enough fluid to keep your urine pale yellow. ? Eat foods that are high in fiber, such as fresh fruits and vegetables, whole grains, and beans. ? Limit foods that are high in fat and processed sugars, such as fried or sweet foods. ? Take an over-the-counter or prescription medicine for constipation. Lifestyle   Have a good support system at home.  Consider joining a chronic pain support group.  Do not use any products that contain nicotine or tobacco, such as cigarettes and e-cigarettes. If you need help quitting, ask your health care provider.  Do not drink alcohol. General instructions  Learn as much as you can about your condition.  Work closely with all your health care providers to find the treatment plan that works best for you.  Ask your health care provider what activities are safe for you.  Keep all follow-up visits as told by your health care provider. This is important. Contact a health care provider if:  Your pain treatments are not working.  You are having side effects from your medicines.  You are struggling with tiredness (fatigue), mood changes, depression, or anxiety. Summary  Neuropathic pain is pain caused by damage to the nerves that are responsible for certain sensations in your body (sensory nerves).  Neuropathic pain may come and go as damaged nerves heal, or it may stay at the same level for years.  Neuropathic pain is usually a long-term condition that can be difficult to treat. Consider joining a chronic pain support group. This information is not intended to replace advice given to you by your health care provider. Make sure you discuss any questions you have with your health care provider. Document Released:  12/01/2003 Document Revised: 06/26/2018 Document Reviewed: 03/22/2017 Elsevier Patient Education  2020 Elsevier Inc.       Edwina Barth, MD [facility], [facility]

## 2019-01-02 ENCOUNTER — Ambulatory Visit (INDEPENDENT_AMBULATORY_CARE_PROVIDER_SITE_OTHER): Payer: PRIVATE HEALTH INSURANCE | Admitting: Family Medicine

## 2019-01-02 ENCOUNTER — Encounter: Payer: Self-pay | Admitting: Family Medicine

## 2019-01-02 ENCOUNTER — Other Ambulatory Visit: Payer: Self-pay

## 2019-01-02 VITALS — BP 104/70 | HR 97 | Temp 98.5°F | Ht 69.0 in | Wt 175.6 lb

## 2019-01-02 DIAGNOSIS — M791 Myalgia, unspecified site: Secondary | ICD-10-CM | POA: Diagnosis not present

## 2019-01-02 DIAGNOSIS — D509 Iron deficiency anemia, unspecified: Secondary | ICD-10-CM | POA: Diagnosis not present

## 2019-01-02 DIAGNOSIS — R202 Paresthesia of skin: Secondary | ICD-10-CM

## 2019-01-02 MED ORDER — GABAPENTIN 300 MG PO CAPS: ORAL_CAPSULE | ORAL | 3 refills | Status: DC

## 2019-01-02 NOTE — Progress Notes (Signed)
10/16/20209:23 AM  Melinda Stone Aug 12, 1980, 38 y.o., female [id number]  Chief Complaint  Patient presents with  . Pain    nerve pain in legs from dm. Does a lot of standing at work. Requesting some type of pain med until she is able to get pain management. Doea not take insulin due to cost    HPI:   Patient is a 38 y.o. 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 She has been on gabapentin 300mg  TID in the past wo much benefit She sees endo for her DM Smokes THC but no tobacco   Lab Results  Component Value Date   HGBA1C 7.8 (A) 10/20/2018   HGBA1C 7.0 (A) 05/15/2018   Lab Results  Component Value Date   MICROALBUR 7.5 (H) 12/03/2018   LDLCALC 28 05/15/2018   CREATININE 0.54 (L) 10/20/2018   Lab Results  Component Value Date   WBC 8.2 10/20/2018   HGB 9.7 (L) 10/20/2018   HCT 29.5 (L) 10/20/2018   MCV 77 (L) 10/20/2018   PLT 213 10/20/2018    Depression screen PHQ 2/9 01/02/2019 12/25/2018 10/20/2018  Decreased Interest 0 0 0  Down, Depressed, Hopeless 0 0 0  PHQ - 2 Score 0 0 0    Fall Risk  01/02/2019 12/25/2018 10/20/2018 08/13/2018 05/15/2018  Falls in the past year? 0 0 0 0 0  Number falls in past yr: 0 - - - -  Injury with Fall? 0 - - - -  Follow up - Falls evaluation completed Falls evaluation completed - Falls evaluation completed     No Known Allergies  Prior to Admission medications   Medication Sig Start Date End Date Taking? Authorizing Provider  aspirin-acetaminophen-caffeine (EXCEDRIN MIGRAINE)  [phone] MG tablet Take 2 tablets by mouth every 6 (six) hours as needed for headache. 10/20/18  Yes Sagardia, Ines Bloomer, MD  glipiZIDE (GLUCOTROL) 5 MG tablet Take 1 tablet (5 mg total) by mouth 2 (two) times daily before a meal. 12/03/18  Yes Shamleffer, Melanie Crazier, MD  glucose blood (ONETOUCH VERIO) test strip 2x daily 12/03/18  Yes Shamleffer, Melanie Crazier, MD  empagliflozin (JARDIANCE) 10 MG TABS tablet Take 10 mg by mouth daily. Patient not taking: Reported on 01/02/2019 10/20/18 01/18/19  Horald Pollen, MD    Past Medical History:  Diagnosis Date  . Diabetes mellitus without complication ([place])   . Ovarian cyst     Past Surgical History:  Procedure Laterality Date  . CESAREAN SECTION    . CHOLECYSTECTOMY  2004  . TUBAL LIGATION  10/19/2010    Social History   Tobacco Use  . Smoking status: Current Some Day Smoker  . Smokeless tobacco: Never Used  Substance Use Topics  . Alcohol use: Yes    Frequency: Never    Family History  Problem Relation Age of Onset  . Diabetes Mother   . Hypertension Mother   . Stroke Mother     Review of Systems  Constitutional: Negative for chills and fever.  Respiratory:  Negative for cough and shortness of breath.   Cardiovascular: Negative for chest pain, palpitations and leg swelling.  Gastrointestinal: Negative for abdominal pain, nausea and vomiting.  Genitourinary: Negative for frequency and urgency.  Musculoskeletal: Positive for myalgias. Negative for back pain and joint pain.  Neurological: Positive for tingling. Negative for tremors and focal weakness.  Endo/Heme/Allergies: Negative for polydipsia.   Per hpi  OBJECTIVE:  Today's Vitals   01/02/19 0905  BP: 104/70  Pulse: 97  Temp: 98.5 F (36.9 C)  SpO2: 100%  Weight: 175 lb 9.6 oz (79.7 kg)  Height: 5\' 9"  (1.753 m)   Body mass index is 25.93 kg/m.   Physical Exam Vitals signs and nursing note reviewed.  Constitutional:      Appearance: She is  well-developed.     Comments: uncomfortable  HENT:     Head: Normocephalic and atraumatic.     Mouth/Throat:     Pharynx: No oropharyngeal exudate.  Eyes:     General: No scleral icterus.    Conjunctiva/sclera: Conjunctivae normal.     Pupils: Pupils are equal, round, and reactive to light.  Neck:     Musculoskeletal: Neck supple.  Cardiovascular:     Rate and Rhythm: Normal rate and regular rhythm.     Pulses: Normal pulses.     Heart sounds: Normal heart sounds. No murmur. No friction rub. No gallop.   Pulmonary:     Effort: Pulmonary effort is normal.     Breath sounds: Normal breath sounds. No wheezing, rhonchi or rales.  Musculoskeletal:     Right lower leg: No edema.     Left lower leg: No edema.  Skin:    General: Skin is warm and dry.  Neurological:     Mental Status: She is alert and oriented to person, place, and time.     Motor: No weakness.     Gait: Gait normal.     Deep Tendon Reflexes: Reflexes normal.     Diabetic Foot Form - Detailed   Diabetic Foot Exam - detailed Diabetic Foot exam was performed with the following findings: Yes 01/02/2019  9:50 AM  Can the patient see the bottom of their feet?: Yes Are the shoes appropriate in style and fit?: Yes Is there swelling or and abnormal foot shape?: No Is there a claw toe deformity?: No Is there elevated skin temparature?: No Is there foot or ankle muscle weakness?: No Normal Range of Motion: No Pulse Foot Exam completed.: Yes  Right posterior Tibialias: Present Left posterior Tibialias: Present  Right Dorsalis Pedis: Present Left Dorsalis Pedis: Present  Sensory Foot Exam Completed.: Yes Semmes-Weinstein Monofilament Test R Site 1-Great Toe: Neg L Site 1-Great Toe: Neg       No results found for this or any previous visit (from the past 24 hour(s)).  No results found.   ASSESSMENT and PLAN  1. Paresthesia of both lower extremities Favoring DM2 neuropathy, labs pending to r/o other causes.  Restarting gabapentin. Reviewed r/se/b. She is awaiting appt with pain mgt. - TSH - Vitamin B12  2. Myalgia - CK - C-reactive protein  3. Iron deficiency anemia, unspecified iron deficiency anemia type - Iron, TIBC and Ferritin Panel  Other orders - gabapentin (NEURONTIN) 300 MG capsule; Take 2 capsules (600 mg total) by mouth at bedtime AND 1 capsule (300 mg total) 2 (two) times daily.  Return in about 4 weeks (around 01/30/2019).    02/01/2019, MD Primary Care at [facility] [address]  [place], [place] [address] Ph.  [phone] Fax [phone]

## 2019-01-02 NOTE — Patient Instructions (Signed)
° ° ° °  If you have lab work done today you will be contacted with your lab results within the next 2 weeks.  If you have not heard from us then please contact us. The fastest way to get your results is to register for My Chart. ° ° °IF you received an x-ray today, you will receive an invoice from [facility]. Please contact  Radiology at [phone] with questions or concerns regarding your invoice.  ° °IF you received labwork today, you will receive an invoice from [facility]. Please contact [facility] at [phone] with questions or concerns regarding your invoice.  ° °Our billing staff will not be able to assist you with questions regarding bills from these companies. ° °You will be contacted with the lab results as soon as they are available. The fastest way to get your results is to activate your My Chart account. Instructions are located on the last page of this paperwork. If you have not heard from us regarding the results in 2 weeks, please contact this office. °  ° ° ° °

## 2019-01-03 LAB — IRON,TIBC AND FERRITIN PANEL
Ferritin: 21 ng/mL (ref 15–150)
Iron Saturation: 17 % (ref 15–55)
Iron: 64 ug/dL (ref 27–159)
Total Iron Binding Capacity: 382 ug/dL (ref 250–450)
UIBC: 318 ug/dL (ref 131–425)

## 2019-01-03 LAB — C-REACTIVE PROTEIN: CRP: <1 mg/L (ref 0–10)

## 2019-01-03 LAB — VITAMIN B12: Vitamin B-12: 564 pg/mL (ref 232–1245)

## 2019-01-03 LAB — CK: Total CK: 47 U/L (ref 32–182)

## 2019-01-03 LAB — TSH: TSH: 0.997 u[IU]/mL (ref 0.450–4.500)

## 2019-01-06 ENCOUNTER — Encounter: Payer: Self-pay | Admitting: Radiology

## 2019-01-20 ENCOUNTER — Ambulatory Visit: Payer: PRIVATE HEALTH INSURANCE | Admitting: Emergency Medicine

## 2019-01-27 ENCOUNTER — Ambulatory Visit: Payer: PRIVATE HEALTH INSURANCE | Admitting: Family Medicine

## 2019-01-29 ENCOUNTER — Other Ambulatory Visit: Payer: Self-pay

## 2019-01-29 ENCOUNTER — Encounter: Payer: Self-pay | Admitting: Family Medicine

## 2019-01-29 ENCOUNTER — Ambulatory Visit (INDEPENDENT_AMBULATORY_CARE_PROVIDER_SITE_OTHER): Payer: PRIVATE HEALTH INSURANCE | Admitting: Family Medicine

## 2019-01-29 VITALS — BP 105/70 | HR 69 | Temp 98.7°F | Ht 69.0 in | Wt 181.0 lb

## 2019-01-29 DIAGNOSIS — E1165 Type 2 diabetes mellitus with hyperglycemia: Secondary | ICD-10-CM | POA: Diagnosis not present

## 2019-01-29 DIAGNOSIS — N920 Excessive and frequent menstruation with regular cycle: Secondary | ICD-10-CM

## 2019-01-29 DIAGNOSIS — M5442 Lumbago with sciatica, left side: Secondary | ICD-10-CM

## 2019-01-29 DIAGNOSIS — D509 Iron deficiency anemia, unspecified: Secondary | ICD-10-CM | POA: Diagnosis not present

## 2019-01-29 DIAGNOSIS — G8929 Other chronic pain: Secondary | ICD-10-CM

## 2019-01-29 DIAGNOSIS — M5441 Lumbago with sciatica, right side: Secondary | ICD-10-CM

## 2019-01-29 DIAGNOSIS — R161 Splenomegaly, not elsewhere classified: Secondary | ICD-10-CM

## 2019-01-29 MED ORDER — DULOXETINE HCL 30 MG PO CPEP: 30.0000 mg | ORAL_CAPSULE | Freq: Two times a day (BID) | ORAL | 2 refills | Status: DC

## 2019-01-29 MED ORDER — MEDROXYPROGESTERONE ACETATE 150 MG/ML IM SUSP: 150.0000 mg | Freq: Once | INTRAMUSCULAR | Status: DC

## 2019-01-29 NOTE — Patient Instructions (Signed)
° ° ° °  If you have lab work done today you will be contacted with your lab results within the next 2 weeks.  If you have not heard from us then please contact us. The fastest way to get your results is to register for My Chart. ° ° °IF you received an x-ray today, you will receive an invoice from [facility]. Please contact [facility] at [phone] with questions or concerns regarding your invoice.  ° °IF you received labwork today, you will receive an invoice from [facility]. Please contact [facility] at [phone] with questions or concerns regarding your invoice.  ° °Our billing staff will not be able to assist you with questions regarding bills from these companies. ° °You will be contacted with the lab results as soon as they are available. The fastest way to get your results is to activate your My Chart account. Instructions are located on the last page of this paperwork. If you have not heard from us regarding the results in 2 weeks, please contact this office. °  ° ° ° °

## 2019-01-29 NOTE — Progress Notes (Signed)
11/12/20205:12 PM  Melinda Stone 09/07/1980, 38 y.o., female [id number]  Chief Complaint  Patient presents with  . Follow-up    nerve pain, has enlarged spleen, 2 cyst causing pain in the stomach and bk. was told to f/u for nx step    HPI:   Patient is a 38 y.o. 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  Gabapentin 600mg  AM and 1200mg  at bedtime still not helping with burning pain, numbness tingling, having some edema Sciatica nerve has been flaring up intermittently for past year Feels that she is having harder time controlling bowels, not able to hold for long, very strong urge for bowel and bladder Has never seen neurology/EMG Has never tried lyrica or cymbalta Will be seeing pain mgt on Tuesday  Takes iron, has menorrhagia, regular Had BTL  Wanting to start depo Currently on her menses  Lab Results  Component Value Date   HGBA1C 7.8 (A) 10/20/2018   HGBA1C 7.0 (A) 05/15/2018   Lab Results  Component Value Date   MICROALBUR 7.5 (H) 12/03/2018   LDLCALC 28 05/15/2018   CREATININE 0.54 (L) 10/20/2018    Depression screen PHQ 2/9 01/29/2019 01/02/2019 12/25/2018  Decreased Interest 0 0 0  Down, Depressed, Hopeless 0 0 0  PHQ - 2 Score 0 0 0    Fall Risk  01/29/2019 01/02/2019 12/25/2018 10/20/2018 08/13/2018  Falls in the past year? 0 0 0 0 0  Number falls in past yr: 0 0 - - -  Injury with Fall? 0 0 - - -  Follow up - - Falls evaluation completed Falls evaluation completed -     No Known Allergies  Prior to Admission medications   Medication Sig Start Date End Date Taking? Authorizing Provider  aspirin-acetaminophen-caffeine (EXCEDRIN MIGRAINE) [phone] MG tablet Take 2 tablets by mouth every 6 (six) hours as needed for headache. 10/20/18  Yes Sagardia,  [phone], MD  cephALEXin (KEFLEX) 500 MG capsule Take by mouth. 01/26/19 02/02/19 Yes [provider]  gabapentin (NEURONTIN) 300 MG capsule Take 2 capsules (600 mg total) by mouth at bedtime AND 1 capsule (300 mg total) 2 (two) times daily. 01/02/19  Yes 02/04/19, MD  glipiZIDE (GLUCOTROL) 5 MG tablet Take 1 tablet (5 mg total) by mouth 2 (two) times daily before a meal. 12/03/18  Yes Shamleffer, Myles Lipps, MD  glucose blood (ONETOUCH VERIO) test strip 2x daily 12/03/18  Yes Shamleffer, Konrad Dolores, MD  ibuprofen (ADVIL) 800 MG tablet Take by mouth. 01/26/19  Yes [provider]    Past Medical History:  Diagnosis Date  . Diabetes mellitus without complication ([facility])   . Ovarian cyst     Past Surgical History:  Procedure Laterality Date  . CESAREAN SECTION    . CHOLECYSTECTOMY  2004  . TUBAL LIGATION  10/19/2010    Social History   Tobacco Use  . Smoking status: Current Some Day Smoker  . Smokeless tobacco: Never Used  Substance Use Topics  . Alcohol use: Yes    Frequency: Never    Family History  Problem Relation Age of Onset  . Diabetes Mother   . Hypertension Mother   . Stroke Mother     ROS Per hpi  OBJECTIVE:  Today's Vitals   01/29/19 1632  BP:  105/70  Pulse: 69  Temp: 98.7 F (37.1 C)  SpO2: 100%  Weight: 181 lb (82.1 kg)  Height: 5\' 9"  (1.753 m)   Body mass index is 26.73 kg/m.   Physical Exam Vitals signs and nursing note reviewed.  Constitutional:      Appearance: She is well-developed.  HENT:     Head: Normocephalic and atraumatic.  Eyes:     General: No scleral icterus.    Conjunctiva/sclera: Conjunctivae normal.     Pupils: Pupils are equal, round, and reactive to light.  Neck:     Musculoskeletal: Neck supple.  Pulmonary:     Effort: Pulmonary effort is normal.  Skin:    General: Skin is warm and dry.  Neurological:     Mental Status: She is alert and oriented to person, place, and time.      No results found for this or any previous visit (from the past 24 hour(s)).  No results found.   ASSESSMENT and PLAN  1. Chronic bilateral low back pain with bilateral sciatica Discussed weaning off gabapentin, starting duloxetine, reviewed r/se/b. Referring to spine surgeon for further eval and mgt.  - Ambulatory referral to Spine Surgery  2. Type 2 diabetes mellitus with hyperglycemia, without long-term current use of insulin ([place]) Checking labs today, medications will be adjusted as needed.  - Comprehensive metabolic panel - Lipid panel - Hemoglobin A1c  3. Iron deficiency anemia, unspecified iron deficiency anemia type - CBC  4. Menorrhagia with regular cycle - medroxyPROGESTERone (DEPO-PROVERA) injection 150 mg  5. Splenomegaly Cbc pending, discussed monitoring  Other orders - DULoxetine (CYMBALTA) 30 MG capsule; Take 1 capsule (30 mg total) by mouth 2 (two) times daily.  Return in about 3 months (around 05/01/2019).    Rutherford Guys, MD Primary Care at [facility], [place] [address] Ph.  [phone] Fax [phone]

## 2019-01-30 ENCOUNTER — Telehealth: Payer: Self-pay | Admitting: Emergency Medicine

## 2019-01-30 ENCOUNTER — Telehealth: Payer: Self-pay

## 2019-01-30 LAB — CBC
Hematocrit: 28.5 % — ABNORMAL LOW (ref 34.0–46.6)
Hemoglobin: 9.4 g/dL — ABNORMAL LOW (ref 11.1–15.9)
MCH: 24.5 pg — ABNORMAL LOW (ref 26.6–33.0)
MCHC: 33 g/dL (ref 31.5–35.7)
MCV: 74 fL — ABNORMAL LOW (ref 79–97)
Platelets: 203 10*3/uL (ref 150–450)
RBC: 3.83 x10E6/uL (ref 3.77–5.28)
RDW: 19 % — ABNORMAL HIGH (ref 11.7–15.4)
WBC: 7.8 10*3/uL (ref 3.4–10.8)

## 2019-01-30 LAB — COMPREHENSIVE METABOLIC PANEL
ALT: 10 IU/L (ref 0–32)
AST: 12 IU/L (ref 0–40)
Albumin/Globulin Ratio: 1.6 (ref 1.2–2.2)
Albumin: 3.9 g/dL (ref 3.8–4.8)
Alkaline Phosphatase: 81 IU/L (ref 39–117)
BUN/Creatinine Ratio: 16 (ref 9–23)
BUN: 9 mg/dL (ref 6–20)
Bilirubin Total: 1.2 mg/dL (ref 0.0–1.2)
CO2: 23 mmol/L (ref 20–29)
Calcium: 8.8 mg/dL (ref 8.7–10.2)
Chloride: 105 mmol/L (ref 96–106)
Creatinine, Ser: 0.58 mg/dL (ref 0.57–1.00)
GFR calc Af Amer: 137 mL/min/{1.73_m2} (ref >59)
GFR calc non Af Amer: 119 mL/min/{1.73_m2} (ref >59)
Globulin, Total: 2.5 g/dL (ref 1.5–4.5)
Glucose: 307 mg/dL — ABNORMAL HIGH (ref 65–99)
Potassium: 4 mmol/L (ref 3.5–5.2)
Sodium: 137 mmol/L (ref 134–144)
Total Protein: 6.4 g/dL (ref 6.0–8.5)

## 2019-01-30 LAB — LIPID PANEL
Chol/HDL Ratio: 1.4 ratio (ref 0.0–4.4)
Cholesterol, Total: 111 mg/dL (ref 100–199)
HDL: 78 mg/dL (ref >39)
LDL Chol Calc (NIH): 19 mg/dL (ref 0–99)
Triglycerides: 66 mg/dL (ref 0–149)
VLDL Cholesterol Cal: 14 mg/dL (ref 5–40)

## 2019-01-30 LAB — HEMOGLOBIN A1C
Est. average glucose Bld gHb Est-mCnc: 140 mg/dL
Hgb A1c MFr Bld: 6.5 % — ABNORMAL HIGH (ref 4.8–5.6)

## 2019-01-30 MED ORDER — FERROUS GLUCONATE 324 (38 FE) MG PO TABS: 324.0000 mg | ORAL_TABLET | Freq: Every day | ORAL | 1 refills | Status: DC

## 2019-01-30 NOTE — Telephone Encounter (Signed)
Pt. Called to let the doctor know that the medication prescribed yesterday (Cymbalta) caused the pt. To have extreme shakes. Pt. Wanted a message passed to the doctor and some advice, possibly a new prescription. Pt. Also looking for instructions on how to get the Depo shot that was prescribed.   Best Number [phone]

## 2019-01-30 NOTE — Telephone Encounter (Signed)
Called pt to resched April 2021 appt. No vm . mb full.   Called pt 2x

## 2019-01-30 NOTE — Addendum Note (Signed)
Addended by: Rutherford Guys on: 01/30/2019 01:01 PM   Modules accepted: Orders

## 2019-02-04 DIAGNOSIS — M545 Low back pain, unspecified: Secondary | ICD-10-CM | POA: Insufficient documentation

## 2019-02-06 ENCOUNTER — Telehealth: Payer: Self-pay | Admitting: Emergency Medicine

## 2019-02-06 NOTE — Telephone Encounter (Signed)
Dermatology specialist will schedule pt when she has an active rash.

## 2019-03-02 ENCOUNTER — Other Ambulatory Visit: Payer: Self-pay

## 2019-03-04 ENCOUNTER — Ambulatory Visit (INDEPENDENT_AMBULATORY_CARE_PROVIDER_SITE_OTHER): Payer: PRIVATE HEALTH INSURANCE | Admitting: Internal Medicine

## 2019-03-04 ENCOUNTER — Ambulatory Visit: Payer: PRIVATE HEALTH INSURANCE | Admitting: Internal Medicine

## 2019-03-04 ENCOUNTER — Encounter: Payer: Self-pay | Admitting: Internal Medicine

## 2019-03-04 VITALS — BP 110/68 | HR 86 | Temp 97.9°F | Ht 69.0 in | Wt 187.4 lb

## 2019-03-04 DIAGNOSIS — E1165 Type 2 diabetes mellitus with hyperglycemia: Secondary | ICD-10-CM | POA: Diagnosis not present

## 2019-03-04 DIAGNOSIS — E1142 Type 2 diabetes mellitus with diabetic polyneuropathy: Secondary | ICD-10-CM

## 2019-03-04 LAB — GLUCOSE, POCT (MANUAL RESULT ENTRY): POC Glucose: 240 mg/dl — AB (ref 70–99)

## 2019-03-04 NOTE — Progress Notes (Signed)
Name: Melinda Stone  Age/ Sex: 38 y.o., female   MRN/ DOB: [id number], Jan 29, 1981     PCP: Horald Pollen, MD   Reason for Endocrinology Evaluation: Type 2 Diabetes Mellitus  Initial Endocrine Consultative Visit: 12/03/2018    PATIENT IDENTIFIER: Melinda Stone is a 38 y.o. female with a past medical history of T2DM. The patient has followed with Endocrinology clinic since 12/03/2018 for consultative assistance with management of her diabetes.  DIABETIC HISTORY:  [name] was diagnosed with DMat age 66. Metformin had caused GI issues and hypoglycemia. Her hemoglobin A1c has ranged from 7.0% in 04/2018, peaking at 7.8% in 10/2018  On her initial visit to our clinic she had an A1c of 7.8%. She was on Jardiance, she had a prescription for Trulicity but was too costly for her. We added glipizide .     Works at [employer] with kids 88 and 35 yrs of age    SUBJECTIVE:   During the last visit (12/03/2018): A1c 7.8% . Continued Jardiance, added Glipizide  Today (03/04/2019): [name] is here for a 3 month follow up on diabetes.  She checks her blood sugars 0 times daily. The patient has not had hypoglycemic episodes since the last clinic visit. Otherwise, the patient has not required any recent emergency interventions for hypoglycemia and has not had recent hospitalizations secondary to hyper or hypoglycemic episodes.    She is currently on steroids for sciatica   ROS: As per HPI and as detailed below: Review of Systems  Constitutional: Negative for chills and fever.  Respiratory: Negative for cough and shortness of breath.   Cardiovascular: Negative for chest pain and palpitations.  Gastrointestinal: Negative for diarrhea and nausea.      HOME DIABETES REGIMEN:   Jardiance 10 mg daily - not taking   Glipizide 5 mg BID   METER DOWNLOAD  SUMMARY: Did not bring      DIABETIC COMPLICATIONS: Microvascular complications:   Left eye retinopathy   Denies: CKD, neuropathy   Last eye exam: Completed 12/2018  Macrovascular complications:    Denies: CAD, PVD, CVA   HISTORY:  Past Medical History:  Past Medical History:  Diagnosis Date  . Diabetes mellitus without complication ([place])   . Ovarian cyst     Past Surgical History:  Past Surgical History:  Procedure Laterality Date  . CESAREAN SECTION    . CHOLECYSTECTOMY  2004  . TUBAL LIGATION  10/19/2010     Social History:  reports that she has been smoking. She has never used smokeless tobacco. She reports current alcohol use. She reports current drug use. Drug: Marijuana. Family History:  Family History  Problem Relation Age of Onset  . Diabetes Mother   . Hypertension Mother   . Stroke Mother       HOME MEDICATIONS: Allergies as of 03/04/2019   No Known Allergies     Medication List       Accurate as of March 04, 2019 10:08 AM. If you have any questions, ask your nurse or doctor.        DULoxetine 30 MG capsule Commonly known as: CYMBALTA Take 1 capsule (30 mg total) by mouth 2 (two) times daily.   Excedrin Migraine 250-250-65 MG tablet Generic drug: aspirin-acetaminophen-caffeine Take 2 tablets by mouth every 6 (six) hours as needed for headache.   ferrous gluconate 324 MG tablet Commonly known as: FERGON Take 1 tablet (324 mg total) by mouth daily with breakfast.  glipiZIDE 5 MG tablet Commonly known as: GLUCOTROL Take 1 tablet (5 mg total) by mouth 2 (two) times daily before a meal.   ibuprofen 800 MG tablet Commonly known as: ADVIL Take by mouth.   OneTouch Verio test strip Generic drug: glucose blood 2x daily        OBJECTIVE:   Vital Signs: BP 110/68 (BP Location: Left Arm, Patient Position: Sitting, Cuff Size: Large)   Pulse 86   Temp 97.9 F (36.6 C)   Ht 5\' 9"  (1.753 m)   Wt 187 lb 6.4 oz (85 kg)    SpO2 97%   BMI 27.67 kg/m   Wt Readings from Last 3 Encounters:  03/04/19 187 lb 6.4 oz (85 kg)  01/29/19 181 lb (82.1 kg)  01/02/19 175 lb 9.6 oz (79.7 kg)     Exam: General: Pt appears well and is in NAD  Lungs: Clear with good BS bilat with no rales, rhonchi, or wheezes  Heart: RRR with normal S1 and S2 and no gallops; no murmurs; no rub  Abdomen: Normoactive bowel sounds, soft, nontender, without masses or organomegaly palpable  Extremities: No pretibial edema.   Skin: Normal texture and temperature to palpation.  Neuro: MS is good with appropriate affect, pt is alert and Ox3    DM foot exam: 12/03/18  The skin of the feet is intact without sores or ulcerations. The pedal pulses are 2+ on right and 2+ on left. The sensation is decreased to a screening 5.07, 10 gram monofilament bilaterally  DATA REVIEWED:  Lab Results  Component Value Date   HGBA1C 6.5 (H) 01/29/2019   HGBA1C 7.8 (A) 10/20/2018   HGBA1C 7.0 (A) 05/15/2018   Lab Results  Component Value Date   MICROALBUR 7.5 (H) 12/03/2018   LDLCALC 19 01/29/2019   CREATININE 0.58 01/29/2019   Lab Results  Component Value Date   MICRALBCREAT 15.3 12/03/2018     Lab Results  Component Value Date   CHOL 111 01/29/2019   HDL 78 01/29/2019   LDLCALC 19 01/29/2019   TRIG 66 01/29/2019   CHOLHDL 1.4 01/29/2019         ASSESSMENT / PLAN / RECOMMENDATIONS:   1) Type 2 Diabetes Mellitus, Optimally controlled, With Neuropathic complications - Most recent A1c of 6.5 %. Goal A1c < 7.0 %.    - Her A1c has improved with Glipizide , she has not been taking the Jardiance due to cost issues.  - She is intolerant to metformin - Trulicity has been costly.  - We discussed weight gain with SU, we discussed exercise when possible and improving intake. She is currently is having left leg pain and is on prednisone which explains a BG of 240 mg/dL.  - Pt encouraged to check glucose at home and to have this data availabe   MEDICATIONS:  Glipizide 5 mg BID  EDUCATION / INSTRUCTIONS:  BG monitoring instructions: Patient is instructed to check her blood sugars 2 times a day, fasting and supper time.  Call [facility] if: BG persistently < 70 or > 300. . I reviewed the Rule of 15 for the treatment of hypoglycemia in detail with the patient. Literature supplied.     F/U in 4 months    Signed electronically by: Lyndle HerrlichAbby Jaralla Shamleffer, MD  [facility]  [facility] [address] [place], [place] [address] Phone: [phone] FAX: [phone]   CC: Georgina QuintSagardia, Miguel Jose, MD [address] [place] [address] Phone: [phone]  Fax: [phone]  Return to Endocrinology clinic as below: Future Appointments  Date Time Provider Department Center  03/04/2019 10:10 AM Shamleffer, Konrad Dolores, MD [facility] None  04/30/2019  8:00 AM Myles Lipps, MD [facility]  06/23/2019  2:00 PM Georgina Quint, MD PCP-PCP [facility]

## 2019-03-04 NOTE — Patient Instructions (Signed)
-   Continue Glipizide 5 mg, 1 tablet before Breakfast and Before supper    - Please try and check your sugar before Breakfast and supper      Choose healthy, lower carb lower calorie snacks: toss salad, cooked vegetables, cottage cheese, peanut butter, low fat cheese / string cheese, lower sodium deli meat, tuna salad or chicken salad     HOW TO TREAT LOW BLOOD SUGARS (Blood sugar LESS THAN 70 MG/DL)  Please follow the RULE OF 15 for the treatment of hypoglycemia treatment (when your (blood sugars are less than 70 mg/dL)    STEP 1: Take 15 grams of carbohydrates when your blood sugar is low, which includes:   3-4 GLUCOSE TABS  OR  3-4 OZ OF JUICE OR REGULAR SODA OR  ONE TUBE OF GLUCOSE GEL     STEP 2: RECHECK blood sugar in 15 MINUTES STEP 3: If your blood sugar is still low at the 15 minute recheck --> then, go back to STEP 1 and treat AGAIN with another 15 grams of carbohydrates.

## 2019-04-30 ENCOUNTER — Other Ambulatory Visit: Payer: Self-pay

## 2019-04-30 ENCOUNTER — Encounter: Payer: Self-pay | Admitting: Family Medicine

## 2019-04-30 ENCOUNTER — Ambulatory Visit (INDEPENDENT_AMBULATORY_CARE_PROVIDER_SITE_OTHER): Payer: PRIVATE HEALTH INSURANCE | Admitting: Family Medicine

## 2019-04-30 VITALS — BP 118/78 | HR 84 | Temp 98.3°F | Ht 69.0 in | Wt 187.0 lb

## 2019-04-30 DIAGNOSIS — M5441 Lumbago with sciatica, right side: Secondary | ICD-10-CM | POA: Diagnosis not present

## 2019-04-30 DIAGNOSIS — E1165 Type 2 diabetes mellitus with hyperglycemia: Secondary | ICD-10-CM | POA: Diagnosis not present

## 2019-04-30 DIAGNOSIS — M5442 Lumbago with sciatica, left side: Secondary | ICD-10-CM

## 2019-04-30 DIAGNOSIS — N920 Excessive and frequent menstruation with regular cycle: Secondary | ICD-10-CM | POA: Diagnosis not present

## 2019-04-30 DIAGNOSIS — G8929 Other chronic pain: Secondary | ICD-10-CM

## 2019-04-30 MED ORDER — GABAPENTIN 600 MG PO TABS: ORAL_TABLET | ORAL | 1 refills | Status: DC

## 2019-04-30 NOTE — Patient Instructions (Signed)
° ° ° °  If you have lab work done today you will be contacted with your lab results within the next 2 weeks.  If you have not heard from us then please contact us. The fastest way to get your results is to register for My Chart. ° ° °IF you received an x-ray today, you will receive an invoice from [facility]. Please contact [facility] at [phone] with questions or concerns regarding your invoice.  ° °IF you received labwork today, you will receive an invoice from [facility]. Please contact [facility] at [phone] with questions or concerns regarding your invoice.  ° °Our billing staff will not be able to assist you with questions regarding bills from these companies. ° °You will be contacted with the lab results as soon as they are available. The fastest way to get your results is to activate your My Chart account. Instructions are located on the last page of this paperwork. If you have not heard from us regarding the results in 2 weeks, please contact this office. °  ° ° ° °

## 2019-04-30 NOTE — Progress Notes (Signed)
2/11/20218:17 AM  Melinda Stone 1980/12/31, 39 y.o., female [id number]  Chief Complaint  Patient presents with  . Pain    sciatic nerve pain, back and forth with muscle relaxers and steroids with no resolve. Has form for completion to allow move to a downstairs unit due to back     HPI:   Patient is a 39 y.o. female with past medical history significant for DM2, neuropathy who presents today for routine followup  Last OV Nov 2020 - referred to spine surg given intermittent bowel incontinence and started on cymbalta,  Saw endo dec 2020   She went to spine surg - unable to afford MRI at office, $800, requesting MRI at hospital, since then had quit her job trying to qualify for medicaid Patient reports that spine surg does think pain is from spine is nature Went to pain mgt - had trigger point injection Did not tolerate duloxetine - made her not feel like herself Back on gabapentin 600mg  AM, sometimes 600mg  afternoon, 1200mg  at bedtime, helps some with diabetic polyneuropathy Reports that sciatica of left leg is unrelenting despite multiple rounds of oral steroids and muscle relaxants (flexeril, methocarbamol, baclofen) Reports stool incontinence episodes have resolves is not having normal BM She will be requesting to be moved to ground floor apartment as stairs are very painful and unsafe for her at this time as she feels weak on left leg  Requesting to start on depo which did not happen last OV for unclear reason She has BTL but would like it for menorrhagia Periods are monthly but very heavy with clots and painful, last about a week LMP Feb 3rd, lasted until 9th  Depression screen [facility] 2/9 04/30/2019 01/29/2019 01/02/2019  Decreased Interest 0 0 0  Down, Depressed, Hopeless 0 0 0  PHQ - 2 Score 0 0 0    Fall Risk  04/30/2019 01/29/2019 01/02/2019 12/25/2018 10/20/2018  Falls in the past year? 0 0 0 0 0  Number falls in past yr: 0 0 0 - -  Injury with Fall? 0 0 0 - -    Follow up - - - Falls evaluation completed Falls evaluation completed     No Known Allergies  Prior to Admission medications   Medication Sig Start Date End Date Taking? Authorizing Provider  aspirin-acetaminophen-caffeine (EXCEDRIN MIGRAINE) [id number] MG tablet Take 2 tablets by mouth every 6 (six) hours as needed for headache. 10/20/18  Yes Sagardia, 12/20/2018, MD  DULoxetine (CYMBALTA) 30 MG capsule Take 1 capsule (30 mg total) by mouth 2 (two) times daily. 01/29/19  Yes 12/20/18, MD  ferrous gluconate (FERGON) 324 MG tablet Take 1 tablet (324 mg total) by mouth daily with breakfast. 01/30/19  Yes 13/12/20, MD  glipiZIDE (GLUCOTROL) 5 MG tablet Take 1 tablet (5 mg total) by mouth 2 (two) times daily before a meal. 12/03/18  Yes Shamleffer, 02/01/19, MD  glucose blood (ONETOUCH VERIO) test strip 2x daily 12/03/18  Yes Shamleffer, 12/05/18, MD  ibuprofen (ADVIL) 800 MG tablet Take by mouth. 01/26/19  Yes [provider]    Past Medical History:  Diagnosis Date  . Diabetes mellitus without complication (HCC)   . Ovarian cyst     Past Surgical History:  Procedure Laterality Date  . CESAREAN SECTION    . CHOLECYSTECTOMY  2004  . TUBAL LIGATION  10/19/2010    Social History   Tobacco Use  . Smoking status: Current Some Day Smoker  . Smokeless  tobacco: Never Used  Substance Use Topics  . Alcohol use: Yes    Family History  Problem Relation Age of Onset  . Diabetes Mother   . Hypertension Mother   . Stroke Mother     Review of Systems  Constitutional: Negative for chills and fever.  Respiratory: Negative for cough and shortness of breath.   Cardiovascular: Negative for chest pain, palpitations and leg swelling.  Gastrointestinal: Negative for abdominal pain, nausea and vomiting.  Musculoskeletal: Positive for back pain.  Neurological: Positive for tingling and focal weakness.  per hpi   OBJECTIVE:  Today's Vitals   04/30/19  0808  BP: 118/78  Pulse: 84  Temp: 98.3 F (36.8 C)  SpO2: 100%  Weight: 187 lb (84.8 kg)  Height: 5\' 9"  (1.753 m)   Body mass index is 27.62 kg/m.   Physical Exam Vitals and nursing note reviewed.  Constitutional:      Appearance: She is well-developed.  HENT:     Head: Normocephalic and atraumatic.  Eyes:     General: No scleral icterus.    Conjunctiva/sclera: Conjunctivae normal.     Pupils: Pupils are equal, round, and reactive to light.  Pulmonary:     Effort: Pulmonary effort is normal.  Musculoskeletal:     Cervical back: Neck supple.     Lumbar back: Spasms and bony tenderness present. Decreased range of motion. Positive left straight leg raise test. Negative right straight leg raise test.     Right lower leg: No edema.     Left lower leg: No edema.  Skin:    General: Skin is warm and dry.  Neurological:     Mental Status: She is alert and oriented to person, place, and time.     Motor: Weakness (hip flexor on the left 4/5 otherwise 5/5 BLE) present.     Gait: Gait abnormal (broad based, slow).     Deep Tendon Reflexes:     Reflex Scores:      Patellar reflexes are 1+ on the right side and 1+ on the left side.      Achilles reflexes are 1+ on the right side and 1+ on the left side.    No results found for this or any previous visit (from the past 24 hour(s)).  No results found.   ASSESSMENT and PLAN  1. Chronic left-sided low back pain with bilateral sciatica Not resolving with conservative measures, present > 3 months, MRI being ordered as  Requested by spine surg. Request for accomodation to ground floor apartment is medically appropriate.  - MR Lumbar Spine Wo Contrast; Future  2. Type 2 diabetes mellitus with hyperglycemia, without long-term current use of insulin ([place]) Managed by endo. Neuropathy stable with gabapentin.   3. Menorrhagia with regular cycle We unfortunately do not have depo available back in clinic. She can come back for nurse  visit next week.   Other orders - gabapentin (NEURONTIN) 600 MG tablet; Take 1 tablet (600 mg total) by mouth 2 (two) times daily AND 2 tablets (1,200 mg total) at bedtime.  Return if symptoms worsen or fail to improve.    Rutherford Guys, MD [facility] [place], [place] [address] Ph.  [phone] Fax [phone]

## 2019-05-26 ENCOUNTER — Telehealth: Payer: Self-pay | Admitting: Emergency Medicine

## 2019-06-16 ENCOUNTER — Ambulatory Visit: Payer: PRIVATE HEALTH INSURANCE | Admitting: Emergency Medicine

## 2019-06-23 ENCOUNTER — Ambulatory Visit: Payer: PRIVATE HEALTH INSURANCE | Admitting: Emergency Medicine

## 2019-06-24 ENCOUNTER — Ambulatory Visit: Payer: PRIVATE HEALTH INSURANCE | Admitting: Emergency Medicine

## 2019-07-06 ENCOUNTER — Other Ambulatory Visit: Payer: Self-pay

## 2019-07-08 ENCOUNTER — Ambulatory Visit (INDEPENDENT_AMBULATORY_CARE_PROVIDER_SITE_OTHER): Payer: PRIVATE HEALTH INSURANCE | Admitting: Internal Medicine

## 2019-07-08 ENCOUNTER — Encounter: Payer: Self-pay | Admitting: Internal Medicine

## 2019-07-08 ENCOUNTER — Other Ambulatory Visit: Payer: Self-pay

## 2019-07-08 VITALS — BP 116/68 | HR 99 | Temp 98.7°F | Ht 67.0 in | Wt 181.0 lb

## 2019-07-08 DIAGNOSIS — E1165 Type 2 diabetes mellitus with hyperglycemia: Secondary | ICD-10-CM

## 2019-07-08 LAB — POCT GLYCOSYLATED HEMOGLOBIN (HGB A1C): Hemoglobin A1C: 8.3 % — AB (ref 4.0–5.6)

## 2019-07-08 LAB — GLUCOSE, POCT (MANUAL RESULT ENTRY): POC Glucose: 333 mg/dl — AB (ref 70–99)

## 2019-07-08 NOTE — Progress Notes (Signed)
Name: Melinda Stone  Age/ Sex: 39 y.o., female   MRN/ DOB: [id number], 08-01-1980     PCP: Georgina Quint, MD   Reason for Endocrinology Evaluation: Type 2 Diabetes Mellitus  Initial Endocrine Consultative Visit: 12/03/2018    PATIENT IDENTIFIER: Melinda Stone is a 39 y.o. female with a past medical history of T2DM. The patient has followed with Endocrinology clinic since 12/03/2018 for consultative assistance with management of her diabetes.  DIABETIC HISTORY:  Melinda Stone was diagnosed with DMat age 48. Metformin had caused GI issues and hypoglycemia. Her hemoglobin A1c has ranged from 7.0% in 04/2018, peaking at 7.8% in 10/2018  On her initial visit to our clinic she had an A1c of 7.8%. She was on Jardiance, she had a prescription for Trulicity and Jardiance were too costly for her. We added glipizide .     Works at [employer] different shifts  Lives with kids 11 and 8 yrs of age    SUBJECTIVE:   During the last visit (03/04/2019): A1c 6.5% . Continued  Glipizide  Today (07/08/2019): Melinda Stone is here for a 3 month follow up on diabetes.  She checks her blood sugars 0 times in the past month. The patient has not had hypoglycemic episodes since the last clinic visit. Otherwise, the patient has not required any recent emergency interventions for hypoglycemia and has not had recent hospitalizations secondary to hyper or hypoglycemic episodes.      ROS: As per HPI and as detailed below: Review of Systems  Constitutional: Negative for chills and fever.  Respiratory: Negative for cough and shortness of breath.   Cardiovascular: Negative for chest pain and palpitations.  Gastrointestinal: Negative for diarrhea and nausea.      HOME DIABETES REGIMEN:   Glipizide 5 mg BID   METER DOWNLOAD SUMMARY: Did not bring      DIABETIC COMPLICATIONS: Microvascular  complications:   Left eye retinopathy   Denies: CKD, neuropathy   Last eye exam: Completed 12/2018  Macrovascular complications:    Denies: CAD, PVD, CVA   HISTORY:  Past Medical History:  Past Medical History:  Diagnosis Date  . Diabetes mellitus without complication (HCC)   . Ovarian cyst    Past Surgical History:  Past Surgical History:  Procedure Laterality Date  . CESAREAN SECTION    . CHOLECYSTECTOMY  2004  . TUBAL LIGATION  10/19/2010    Social History:  reports that she has been smoking. She has never used smokeless tobacco. She reports current alcohol use. She reports current drug use. Drug: Marijuana. Family History:  Family History  Problem Relation Age of Onset  . Diabetes Mother   . Hypertension Mother   . Stroke Mother      HOME MEDICATIONS: Allergies as of 07/08/2019   No Known Allergies     Medication List       Accurate as of July 08, 2019  9:51 AM. If you have any questions, ask your nurse or doctor.        Excedrin Migraine 250-250-65 MG tablet Generic drug: aspirin-acetaminophen-caffeine Take 2 tablets by mouth every 6 (six) hours as needed for headache.   ferrous gluconate 324 MG tablet Commonly known as: FERGON Take 1 tablet (324 mg total) by mouth daily with breakfast.   gabapentin 600 MG tablet Commonly known as: NEURONTIN Take 1 tablet (600 mg total) by mouth 2 (two) times daily AND 2 tablets (1,200 mg total) at bedtime.   glipiZIDE 5 MG tablet Commonly  known as: GLUCOTROL Take 1 tablet (5 mg total) by mouth 2 (two) times daily before a meal.   ibuprofen 800 MG tablet Commonly known as: ADVIL Take by mouth.   OneTouch Verio test strip Generic drug: glucose blood 2x daily        OBJECTIVE:   Vital Signs: BP 116/68 (BP Location: Left Arm, Patient Position: Sitting, Cuff Size: Large)   Pulse 99   Temp 98.7 F (37.1 C)   Ht 5\' 7"  (1.702 m)   Wt 181 lb (82.1 kg)   SpO2 97%   BMI 28.35 kg/m   Wt Readings  from Last 3 Encounters:  07/08/19 181 lb (82.1 kg)  04/30/19 187 lb (84.8 kg)  03/04/19 187 lb 6.4 oz (85 kg)     Exam: General: Pt appears well and is in NAD  Lungs: Clear with good BS bilat with no rales, rhonchi, or wheezes  Heart: RRR with normal S1 and S2 and no gallops; no murmurs; no rub  Abdomen: Normoactive bowel sounds, soft, nontender, without masses or organomegaly palpable  Extremities: No pretibial edema.   Skin: Normal texture and temperature to palpation.  Neuro: MS is good with appropriate affect, pt is alert and Ox3    DM foot exam: 07/08/2019 The skin of the feet is intact without sores or ulcerations. The pedal pulses are 2+ on right and 2+ on left. The sensation is decreased to a screening 5.07, 10 gram monofilament bilaterally  DATA REVIEWED:  Lab Results  Component Value Date   HGBA1C 8.3 (A) 07/08/2019   HGBA1C 6.5 (H) 01/29/2019   HGBA1C 7.8 (A) 10/20/2018   Lab Results  Component Value Date   MICROALBUR 7.5 (H) 12/03/2018   LDLCALC 19 01/29/2019   CREATININE 0.58 01/29/2019   Lab Results  Component Value Date   MICRALBCREAT 15.3 12/03/2018     Lab Results  Component Value Date   CHOL 111 01/29/2019   HDL 78 01/29/2019   LDLCALC 19 01/29/2019   TRIG 66 01/29/2019   CHOLHDL 1.4 01/29/2019         ASSESSMENT / PLAN / RECOMMENDATIONS:   1) Type 2 Diabetes Mellitus, Poorly controlled, With Neuropathic complications - Most recent A1c of 8.3 %. Goal A1c < 7.0 %.    - Worsening glycemic control due to medication non-adherence. She has not taken her glipizide in months nor has she checked her glucose, she initially claimed that she had no refills but I have advised her that a 1 yr supply was sent to her pharmacy back in 11/2018.  - Pt also states she has no insurance and I have advised her that Glipizide is on the $4 list at [place], I also advised her to obtain the ReliOn meter with strips as those would be a better price. - Pt encouraged to  restart Glipizide as it has worked in the past   MEDICATIONS:  Restart Glipizide 5 mg BID  EDUCATION / INSTRUCTIONS:  BG monitoring instructions: Patient is instructed to check her blood sugars 2 times a day, fasting and supper time.  Call [facility] if: BG persistently < 70 or > 300. . I reviewed the Rule of 15 for the treatment of hypoglycemia in detail with the patient. Literature supplied.     F/U in 6 months    Signed electronically by: Mack Guise, MD  [facility]  [facility] [address] [place], [place] [address] Phone: [phone] FAX: [phone]  CC: Georgina Quint, MD [address] [place] [address] Phone: [phone]  Fax: [phone]  Return to Endocrinology clinic as below: Future Appointments  Date Time Provider Department Center  01/07/2020  9:30 AM Johni Narine, Konrad Dolores, MD [id number] None

## 2019-07-08 NOTE — Patient Instructions (Signed)
-   Restart Glipizide 5 mg, 1 tablet before Breakfast and Before supper    - Get the ReliOn meter from [facility] with strips, try and check 1 time a day       HOW TO TREAT LOW BLOOD SUGARS (Blood sugar LESS THAN 70 MG/DL)  Please follow the RULE OF 15 for the treatment of hypoglycemia treatment (when your (blood sugars are less than 70 mg/dL)    STEP 1: Take 15 grams of carbohydrates when your blood sugar is low, which includes:   3-4 GLUCOSE TABS  OR  3-4 OZ OF JUICE OR REGULAR SODA OR  ONE TUBE OF GLUCOSE GEL     STEP 2: RECHECK blood sugar in 15 MINUTES STEP 3: If your blood sugar is still low at the 15 minute recheck --> then, go back to STEP 1 and treat AGAIN with another 15 grams of carbohydrates.

## 2019-11-06 DIAGNOSIS — M62838 Other muscle spasm: Secondary | ICD-10-CM | POA: Insufficient documentation

## 2019-11-06 DIAGNOSIS — M5430 Sciatica, unspecified side: Secondary | ICD-10-CM | POA: Insufficient documentation

## 2019-11-07 ENCOUNTER — Other Ambulatory Visit: Payer: Self-pay | Admitting: Internal Medicine

## 2019-11-20 ENCOUNTER — Encounter (HOSPITAL_COMMUNITY): Payer: Self-pay | Admitting: *Deleted

## 2019-11-20 ENCOUNTER — Emergency Department (HOSPITAL_COMMUNITY)
Admission: EM | Admit: 2019-11-20 | Discharge: 2019-11-20 | Disposition: A | Discharge status: Home / Self Care | Payer: PRIVATE HEALTH INSURANCE | Attending: Emergency Medicine | Admitting: Emergency Medicine

## 2019-11-20 ENCOUNTER — Other Ambulatory Visit: Payer: Self-pay

## 2019-11-20 DIAGNOSIS — Z5321 Procedure and treatment not carried out due to patient leaving prior to being seen by health care provider: Secondary | ICD-10-CM | POA: Insufficient documentation

## 2019-11-20 DIAGNOSIS — M549 Dorsalgia, unspecified: Secondary | ICD-10-CM | POA: Insufficient documentation

## 2019-11-20 LAB — URINALYSIS, ROUTINE W REFLEX MICROSCOPIC
Bacteria, UA: NONE SEEN
Bilirubin Urine: NEGATIVE
Glucose, UA: >=500 mg/dL — AB
Hgb urine dipstick: NEGATIVE
Ketones, ur: NEGATIVE mg/dL
Leukocytes,Ua: NEGATIVE
Nitrite: NEGATIVE
Protein, ur: NEGATIVE mg/dL
Specific Gravity, Urine: 1.031 — ABNORMAL HIGH (ref 1.005–1.030)
pH: 6 (ref 5.0–8.0)

## 2019-11-20 LAB — BASIC METABOLIC PANEL
Anion gap: 11 (ref 5–15)
BUN: 14 mg/dL (ref 6–20)
CO2: 25 mmol/L (ref 22–32)
Calcium: 9.4 mg/dL (ref 8.9–10.3)
Chloride: 98 mmol/L (ref 98–111)
Creatinine, Ser: 0.72 mg/dL (ref 0.44–1.00)
GFR calc Af Amer: >60 mL/min (ref >60)
GFR calc non Af Amer: >60 mL/min (ref >60)
Glucose, Bld: 482 mg/dL — ABNORMAL HIGH (ref 70–99)
Potassium: 4.2 mmol/L (ref 3.5–5.1)
Sodium: 134 mmol/L — ABNORMAL LOW (ref 135–145)

## 2019-11-20 LAB — CBC
HCT: 28.4 % — ABNORMAL LOW (ref 36.0–46.0)
Hemoglobin: 9.6 g/dL — ABNORMAL LOW (ref 12.0–15.0)
MCH: 24.8 pg — ABNORMAL LOW (ref 26.0–34.0)
MCHC: 33.8 g/dL (ref 30.0–36.0)
MCV: 73.4 fL — ABNORMAL LOW (ref 80.0–100.0)
Platelets: 208 10*3/uL (ref 150–400)
RBC: 3.87 MIL/uL (ref 3.87–5.11)
RDW: 22.6 % — ABNORMAL HIGH (ref 11.5–15.5)
WBC: 12.1 10*3/uL — ABNORMAL HIGH (ref 4.0–10.5)
nRBC: 0 % (ref 0.0–0.2)

## 2019-11-20 LAB — CBG MONITORING, ED: Glucose-Capillary: 457 mg/dL — ABNORMAL HIGH (ref 70–99)

## 2019-11-20 LAB — I-STAT BETA HCG BLOOD, ED (MC, WL, AP ONLY): I-stat hCG, quantitative: <5 m[IU]/mL (ref <5)

## 2019-11-20 NOTE — ED Triage Notes (Signed)
Pt reporting that her sciatica is bothering her, sugars have been in the 500's since starting prednisone and she has a yeast infection.

## 2019-12-01 DIAGNOSIS — K922 Gastrointestinal hemorrhage, unspecified: Secondary | ICD-10-CM | POA: Insufficient documentation

## 2019-12-04 ENCOUNTER — Telehealth: Payer: Self-pay | Admitting: Emergency Medicine

## 2019-12-04 NOTE — Telephone Encounter (Signed)
Copied from CRM [id number]. Topic: Appointment Scheduling - Scheduling Inquiry for Clinic >> Dec 04, 2019  9:17 AM Marylen Ponto wrote: Reason for CRM: Pt would like to schedule an appt an orange card. Pt requests call back

## 2019-12-04 NOTE — Telephone Encounter (Signed)
I call back the Pt,Melinda Stone, inform him that it see that his primary care is located at [facility] clinic, they have they own people to do the financial, now if he want to change doctor and location he need to see the primary care doctor in  Our clinic and after he see the doctor he can apply for financial with [place]

## 2020-01-07 ENCOUNTER — Other Ambulatory Visit: Payer: Self-pay

## 2020-01-07 ENCOUNTER — Encounter: Payer: Self-pay | Admitting: Internal Medicine

## 2020-01-07 ENCOUNTER — Ambulatory Visit (INDEPENDENT_AMBULATORY_CARE_PROVIDER_SITE_OTHER): Payer: Self-pay | Admitting: Internal Medicine

## 2020-01-07 VITALS — BP 126/72 | HR 88 | Temp 97.2°F | Ht 69.0 in | Wt 200.4 lb

## 2020-01-07 DIAGNOSIS — E1142 Type 2 diabetes mellitus with diabetic polyneuropathy: Secondary | ICD-10-CM

## 2020-01-07 DIAGNOSIS — E1165 Type 2 diabetes mellitus with hyperglycemia: Secondary | ICD-10-CM

## 2020-01-07 LAB — POCT GLYCOSYLATED HEMOGLOBIN (HGB A1C): Hemoglobin A1C: 6.9 % — AB (ref 4.0–5.6)

## 2020-01-07 LAB — GLUCOSE, POCT (MANUAL RESULT ENTRY): POC Glucose: 287 mg/dl — AB (ref 70–99)

## 2020-01-07 NOTE — Patient Instructions (Signed)
-   Keep up the Good Work ! - Continue  Glipizide 5 mg, 1 tablet before Breakfast and Before supper      HOW TO TREAT LOW BLOOD SUGARS (Blood sugar LESS THAN 70 MG/DL)  Please follow the RULE OF 15 for the treatment of hypoglycemia treatment (when your (blood sugars are less than 70 mg/dL)    STEP 1: Take 15 grams of carbohydrates when your blood sugar is low, which includes:   3-4 GLUCOSE TABS  OR  3-4 OZ OF JUICE OR REGULAR SODA OR  ONE TUBE OF GLUCOSE GEL     STEP 2: RECHECK blood sugar in 15 MINUTES STEP 3: If your blood sugar is still low at the 15 minute recheck --> then, go back to STEP 1 and treat AGAIN with another 15 grams of carbohydrates.

## 2020-01-07 NOTE — Progress Notes (Signed)
Name: Melinda Stone  Age/ Sex: 39 y.o., female   MRN/ DOB: [id number], Jan 17, 1981     PCP: Georgina Quint, MD   Reason for Endocrinology Evaluation: Type 2 Diabetes Mellitus  Initial Endocrine Consultative Visit: 12/03/2018    Stone IDENTIFIER: Melinda Stone is a 39 y.o. female with a past medical history of T2DM. Melinda Stone has followed with Endocrinology clinic since 12/03/2018 for consultative assistance with management of Melinda Stone diabetes.  DIABETIC HISTORY:  Melinda Stone was diagnosed with DMat age 18. Metformin had caused GI issues and hypoglycemia. Melinda Stone hemoglobin A1c has ranged from 7.0% in 04/2018, peaking at 7.8% in 10/2018  On Melinda Stone initial visit to our clinic Melinda Stone had an A1c of 7.8%. Melinda Stone was on Jardiance, Melinda Stone had a prescription for Trulicity and Jardiance were too costly for Melinda Stone. We added glipizide .     Works at [employer] different shifts  Lives with kids 11 and 8 yrs of age    SUBJECTIVE:   During Melinda last visit (07/08/2019): A1c 8.3% . Restarted Glipizide   Today (01/07/2020): Melinda Stone is here for a 6 month follow up on diabetes.  Melinda Stone checks Melinda Stone blood sugars 0 times in Melinda past month. Melinda Stone has not had hypoglycemic episodes since Melinda last clinic visit.    Melinda Stone is having issues with LE swelling requiring ED visits  HOME DIABETES REGIMEN:   Glipizide 5 mg BID   METER DOWNLOAD SUMMARY: Did not bring      DIABETIC COMPLICATIONS: Microvascular complications:   Left eye retinopathy   Denies: CKD, neuropathy   Last eye exam: Completed 11/2019  Macrovascular complications:    Denies: CAD, PVD, CVA   HISTORY:  Past Medical History:  Past Medical History:  Diagnosis Date  . Diabetes mellitus without complication (HCC)   . Ovarian cyst    Past Surgical History:  Past Surgical History:  Procedure Laterality Date  . CESAREAN SECTION    .  CHOLECYSTECTOMY  2004  . TUBAL LIGATION  10/19/2010    Social History:  reports that Melinda Stone has been smoking. Melinda Stone has never used smokeless tobacco. Melinda Stone reports current alcohol use. Melinda Stone reports current drug use. Drug: Marijuana. Family History:  Family History  Problem Relation Age of Onset  . Diabetes Mother   . Hypertension Mother   . Stroke Mother      HOME MEDICATIONS: Allergies as of 01/07/2020   No Known Allergies     Medication List       Accurate as of January 07, 2020  9:48 AM. If you have any questions, ask your nurse or doctor.        Excedrin Migraine 250-250-65 MG tablet Generic drug: aspirin-acetaminophen-caffeine Take 2 tablets by mouth every 6 (six) hours as needed for headache.   ferrous gluconate 324 MG tablet Commonly known as: FERGON Take 1 tablet (324 mg total) by mouth daily with breakfast.   gabapentin 600 MG tablet Commonly known as: NEURONTIN Take 1 tablet (600 mg total) by mouth 2 (two) times daily AND 2 tablets (1,200 mg total) at bedtime.   glipiZIDE 5 MG tablet Commonly known as: GLUCOTROL TAKE 1 TABLET BY MOUTH TWICE DAILY BEFORE A MEAL   ibuprofen 800 MG tablet Commonly known as: ADVIL Take by mouth.   OneTouch Verio test strip Generic drug: glucose blood 2x daily        OBJECTIVE:   Vital Signs: BP 126/72   Pulse 88   Temp (!) 97.2 F (36.2  C) (Temporal)   Ht 5\' 9"  (1.753 m)   Wt 200 lb 6.4 oz (90.9 kg)   SpO2 99%   BMI 29.59 kg/m   Wt Readings from Last 3 Encounters:  01/07/20 200 lb 6.4 oz (90.9 kg)  07/08/19 181 lb (82.1 kg)  04/30/19 187 lb (84.8 kg)     Exam: General: Pt appears well and is in NAD  Lungs: Clear with good BS bilat with no rales, rhonchi, or wheezes  Heart: RRR   Extremities: 1+ pretibial edema.   Neuro: MS is good with appropriate affect, pt is alert and Ox3    DM foot exam: 07/08/2019 Melinda skin of Melinda feet is intact without sores or ulcerations. Melinda pedal pulses are 2+ on right and 2+ on  left. Melinda sensation is decreased to a screening 5.07, 10 gram monofilament bilaterally     DATA REVIEWED:  Lab Results  Component Value Date   HGBA1C 6.9 (A) 01/07/2020   HGBA1C 8.3 (A) 07/08/2019   HGBA1C 6.5 (H) 01/29/2019   Lab Results  Component Value Date   MICROALBUR 7.5 (H) 12/03/2018   LDLCALC 19 01/29/2019   CREATININE 0.72 11/20/2019   Lab Results  Component Value Date   MICRALBCREAT 15.3 12/03/2018     Lab Results  Component Value Date   CHOL 111 01/29/2019   HDL 78 01/29/2019   LDLCALC 19 01/29/2019   TRIG 66 01/29/2019   CHOLHDL 1.4 01/29/2019       In-office BG 287 ( this is postprandial )   ASSESSMENT / PLAN / RECOMMENDATIONS:   1) Type 2 Diabetes Mellitus, OPtimally controlled, With Neuropathic complications - Most recent A1c of 6.9 %. Goal A1c < 7.0 %.      - Praised Melinda pt on improved glycemic control  - No changes will be made today     MEDICATIONS:  Continue Glipizide 5 mg BID  EDUCATION / INSTRUCTIONS:  BG monitoring instructions: Stone is instructed to check Melinda Stone blood sugars 2 times a day, fasting and supper time.  Call  Endocrinology clinic if: BG persistently < 70  . I reviewed Melinda Rule of 15 for Melinda treatment of hypoglycemia in detail with Melinda Stone. Literature supplied.     F/U in 6 months    Signed electronically by: 13/02/2019, MD  [facility] Endocrinology  [facility] [address] [place] [place], [place] Phone: [phone] FAX: [phone]   CC: [phone], MD [address] [place] Phone: [phone]  Fax: [phone]  Return to Endocrinology clinic as below: No future appointments.

## 2020-01-17 ENCOUNTER — Other Ambulatory Visit: Payer: Self-pay | Admitting: Internal Medicine

## 2020-03-01 ENCOUNTER — Emergency Department (HOSPITAL_COMMUNITY)
Admission: EM | Admit: 2020-03-01 | Discharge: 2020-03-01 | Disposition: A | Discharge status: Home / Self Care | Payer: Medicaid Other | Attending: Emergency Medicine | Admitting: Emergency Medicine

## 2020-03-01 ENCOUNTER — Encounter (HOSPITAL_COMMUNITY): Payer: Self-pay | Admitting: Emergency Medicine

## 2020-03-01 ENCOUNTER — Other Ambulatory Visit: Payer: Self-pay

## 2020-03-01 ENCOUNTER — Ambulatory Visit (INDEPENDENT_AMBULATORY_CARE_PROVIDER_SITE_OTHER): Payer: Self-pay | Admitting: Emergency Medicine

## 2020-03-01 ENCOUNTER — Encounter: Payer: Self-pay | Admitting: Emergency Medicine

## 2020-03-01 VITALS — BP 94/63 | HR 118 | Temp 97.6°F | Resp 16 | Ht 69.0 in | Wt 189.0 lb

## 2020-03-01 DIAGNOSIS — R000 Tachycardia, unspecified: Secondary | ICD-10-CM

## 2020-03-01 DIAGNOSIS — E1165 Type 2 diabetes mellitus with hyperglycemia: Secondary | ICD-10-CM

## 2020-03-01 DIAGNOSIS — M5442 Lumbago with sciatica, left side: Secondary | ICD-10-CM

## 2020-03-01 DIAGNOSIS — E861 Hypovolemia: Secondary | ICD-10-CM

## 2020-03-01 DIAGNOSIS — Z5321 Procedure and treatment not carried out due to patient leaving prior to being seen by health care provider: Secondary | ICD-10-CM | POA: Insufficient documentation

## 2020-03-01 DIAGNOSIS — Z7984 Long term (current) use of oral hypoglycemic drugs: Secondary | ICD-10-CM | POA: Insufficient documentation

## 2020-03-01 DIAGNOSIS — E1100 Type 2 diabetes mellitus with hyperosmolarity without nonketotic hyperglycemic-hyperosmolar coma (NKHHC): Secondary | ICD-10-CM

## 2020-03-01 DIAGNOSIS — M549 Dorsalgia, unspecified: Secondary | ICD-10-CM

## 2020-03-01 DIAGNOSIS — I9589 Other hypotension: Secondary | ICD-10-CM

## 2020-03-01 DIAGNOSIS — G8929 Other chronic pain: Secondary | ICD-10-CM

## 2020-03-01 LAB — BASIC METABOLIC PANEL
Anion gap: 10 (ref 5–15)
BUN: 14 mg/dL (ref 6–20)
CO2: 21 mmol/L — ABNORMAL LOW (ref 22–32)
Calcium: 8.8 mg/dL — ABNORMAL LOW (ref 8.9–10.3)
Chloride: 99 mmol/L (ref 98–111)
Creatinine, Ser: 1.08 mg/dL — ABNORMAL HIGH (ref 0.44–1.00)
GFR, Estimated: >60 mL/min (ref >60)
Glucose, Bld: 569 mg/dL (ref 70–99)
Potassium: 4.2 mmol/L (ref 3.5–5.1)
Sodium: 130 mmol/L — ABNORMAL LOW (ref 135–145)

## 2020-03-01 LAB — CBC
HCT: 32.1 % — ABNORMAL LOW (ref 36.0–46.0)
Hemoglobin: 11.4 g/dL — ABNORMAL LOW (ref 12.0–15.0)
MCH: 28.6 pg (ref 26.0–34.0)
MCHC: 35.5 g/dL (ref 30.0–36.0)
MCV: 80.7 fL (ref 80.0–100.0)
Platelets: 193 10*3/uL (ref 150–400)
RBC: 3.98 MIL/uL (ref 3.87–5.11)
RDW: 20.5 % — ABNORMAL HIGH (ref 11.5–15.5)
WBC: 10.3 10*3/uL (ref 4.0–10.5)
nRBC: 0 % (ref 0.0–0.2)

## 2020-03-01 LAB — POCT URINALYSIS DIP (MANUAL ENTRY)
Bilirubin, UA: NEGATIVE
Glucose, UA: >=1000 mg/dL — AB
Ketones, POC UA: NEGATIVE mg/dL
Leukocytes, UA: NEGATIVE
Nitrite, UA: NEGATIVE
Protein Ur, POC: 30 mg/dL — AB
Spec Grav, UA: 1.02 (ref 1.010–1.025)
Urobilinogen, UA: 0.2 E.U./dL
pH, UA: 5.5 (ref 5.0–8.0)

## 2020-03-01 LAB — I-STAT BETA HCG BLOOD, ED (MC, WL, AP ONLY): I-stat hCG, quantitative: <5 m[IU]/mL (ref <5)

## 2020-03-01 LAB — GLUCOSE, POCT (MANUAL RESULT ENTRY)

## 2020-03-01 LAB — POC MICROSCOPIC URINALYSIS (UMFC): Mucus: ABSENT

## 2020-03-01 LAB — POCT GLYCOSYLATED HEMOGLOBIN (HGB A1C): Hemoglobin A1C: 7.4 % — AB (ref 4.0–5.6)

## 2020-03-01 LAB — POCT URINE PREGNANCY: Preg Test, Ur: NEGATIVE

## 2020-03-01 MED ORDER — KETOROLAC TROMETHAMINE 60 MG/2ML IM SOLN
60.0000 mg | Freq: Once | INTRAMUSCULAR | Status: AC
  Administered 2020-03-01: 60 mg via INTRAMUSCULAR

## 2020-03-01 NOTE — ED Triage Notes (Signed)
Pt reports she went to her doctor and blood sugar was high, is on glipizide for DM but reports she had sweet tea this morning. Does not check blood sugar at home. Also endorses sciatic pain. Denies n/v.

## 2020-03-01 NOTE — ED Notes (Signed)
Called pt name x4 for VS recheck. No response from pt.  

## 2020-03-01 NOTE — Patient Instructions (Addendum)
Uncontrolled diabetes with hyperosmolar state, hypotension and tachycardia. Need to go to the nearest emergency room for further evaluation and treatment.    If you have lab work done today you will be contacted with your lab results within the next 2 weeks.  If you have not heard from us then please contact us. The fastest way to get your results is to register for My Chart.   IF you received an x-ray today, you will receive an invoice from [facility]. Please contact [facility] at [phone] with questions or concerns regarding your invoice.   IF you received labwork today, you will receive an invoice from [facility]. Please contact [facility] at [phone] with questions or concerns regarding your invoice.   Our billing staff will not be able to assist you with questions regarding bills from these companies.  You will be contacted with the lab results as soon as they are available. The fastest way to get your results is to activate your My Chart account. Instructions are located on the last page of this paperwork. If you have not heard from us regarding the results in 2 weeks, please contact this office.     Hyperglycemic Hyperosmolar State Hyperglycemic hyperosmolar state is a serious condition in which you experience an extreme increase in your blood sugar (glucose) level. This makes your body become extremely dehydrated, which can be life-threatening. This condition is a result of uncontrolled or undiagnosed diabetes. It occurs most often in people who have type 2 diabetes (type 2 diabetes mellitus). Certain hormones (insulin and glucagon) control the level of glucose that is in the blood. Insulin lowers blood glucose, and glucagon increases blood glucose. Hyperglycemia can result from having too little insulin in the bloodstream, or from the body not responding normally to insulin. Normally, the body gets rid of excess glucose through urine. If you do not drink enough  fluids, or if you drink fluids that contain sugar, your body cannot get rid of excess glucose. This can result in hyperglycemic hyperosmolar state. What are the causes? This condition may be caused by:  Infection.  Medicines that cause you to become dehydrated or cause you to lose fluid.  Certain illnesses.  Not taking your diabetes medicine.  New onset or diagnosis of diabetes.  Cardiovascular disease (CVD). What increases the risk? The following factors make you more likely to develop this condition:  Older age.  Poor management of diabetes.  Inability to eat or drink normally.  Heart failure.  Infection.  Surgery.  Illness. What are the signs or symptoms? Symptoms of this condition include:  Extreme or increased thirst. This symptom may gradually disappear.  Needing to urinate more often than usual.  Dry mouth.  Warm, dry skin that does not sweat even in high temperatures.  High fever.  Sleepiness or confusion.  Vision problems or vision loss.  Seeing, hearing, tasting, smelling, or feeling things that are not real (hallucinations).  Weakness.  Weight loss.  Vomiting. How is this diagnosed? Hyperglycemic hyperosmolar state is diagnosed based on your medical history, your symptoms, and a blood test to measure your blood glucose level. How is this treated? This condition is treated in the hospital. The goals of treatment are:  To correct dehydration by replacing fluids that you have lost. Fluids will be given through an IV tube.  To improve blood sugar levels using insulin or other medicines as needed.  To treat the cause of hyperglycemia, such as an infection, illness, or newly diagnosed diabetes. Follow  these instructions at home: General instructions  Take over-the-counter and prescription medicines only as told by your health care provider.  Do not use any products that contain nicotine or tobacco, such as cigarettes and e-cigarettes. If you  need help quitting, ask your health care provider.  Limit alcohol intake to no more than 1 drink a day for nonpregnant women and 2 drinks a day for men. One drink equals 12 oz of beer, 5 oz of wine, or 1 oz of hard liquor.  Stay hydrated, especially when you exercise, when you get sick, or when you spend time in hot temperatures.  Learn to manage stress. If you need help with this, ask your health care provider.  Keep all follow-up visits as told by your health care provider. This is important. Eating and drinking   Maintain a healthy weight.  Exercise regularly, as directed by your health care provider.  Eat healthy foods, such as: ? Lean proteins. ? Complex carbohydrates. ? Fresh fruits and vegetables. ? Low-fat dairy products. ? Healthy fats.  Drink enough fluid to keep your urine clear or pale yellow. If You Have Diabetes:   Make sure you know the early signs and symptoms of hyperglycemia.  Follow your diabetes management plan, as told by your health care provider. Make sure you: ? Take your insulin and medicines as directed. ? Follow your exercise plan. ? Follow your meal plan. Eat on time, and do not skip meals. ? Check your blood glucose as often as directed. Make sure to check your blood glucose before and after exercise. If you exercise longer or in a different way than usual, check your blood glucose more often. ? Follow your sick day plan whenever you cannot eat or drink normally. Make this plan in advance with your health care provider.  Share your diabetes management plan with people in your workplace, school, and household.  Check your urine for ketones when you are ill and as often as told by your health care provider.  Carry a medical alert card or wear medical alert jewelry. Contact a health care provider if:  You cannot eat or drink without throwing up.  You develop a fever. Get help right away if:  You develop symptoms of hyperglycemic hyperosmolar  state. These symptoms may represent a serious problem that is an emergency. Do not wait to see if the symptoms will go away. Get medical help right away. Call your local emergency services (911 in the [place] Do not drive yourself to the hospital. Summary  Hyperglycemic hyperosmolar state is a serious condition in which you experience an extreme increase in your blood sugar (glucose) level. This makes your body become extremely dehydrated, which can be life-threatening.  This condition is a result of uncontrolled or undiagnosed diabetes. It occurs most often in people who have type 2 diabetes (type 2 diabetes mellitus).  This condition is treated in the hospital. Treatment may include fluids given through an IV tube and other medicines.  Make sure you know the early signs and symptoms of hyperglycemia.  Follow your diabetes management plan, as told by your health care provider. This information is not intended to replace advice given to you by your health care provider. Make sure you discuss any questions you have with your health care provider. Document Revised: 02/27/2016 Document Reviewed: 02/27/2016 Elsevier Patient Education  2020 [employer]  Sciatica  Sciatica is pain, weakness, tingling, or loss of feeling (numbness) along the sciatic nerve. The sciatic nerve starts in  the lower back and goes down the back of each leg. Sciatica usually goes away on its own or with treatment. Sometimes, sciatica may come back (recur). What are the causes? This condition happens when the sciatic nerve is pinched or has pressure put on it. This may be the result of:  A disk in between the bones of the spine bulging out too far (herniated disk).  Changes in the spinal disks that occur with aging.  A condition that affects a muscle in the butt.  Extra bone growth near the sciatic nerve.  A break (fracture) of the area between your hip bones (pelvis).  Pregnancy.  Tumor. This is rare. What  increases the risk? You are more likely to develop this condition if you:  Play sports that put pressure or stress on the spine.  Have poor strength and ease of movement (flexibility).  Have had a back injury in the past.  Have had back surgery.  Sit for long periods of time.  Do activities that involve bending or lifting over and over again.  Are very overweight (obese). What are the signs or symptoms? Symptoms can vary from mild to very bad. They may include:  Any of these problems in the lower back, leg, hip, or butt: ? Mild tingling, loss of feeling, or dull aches. ? Burning sensations. ? Sharp pains.  Loss of feeling in the back of the calf or the sole of the foot.  Leg weakness.  Very bad back pain that makes it hard to move. These symptoms may get worse when you cough, sneeze, or laugh. They may also get worse when you sit or stand for long periods of time. How is this treated? This condition often gets better without any treatment. However, treatment may include:  Changing or cutting back on physical activity when you have pain.  Doing exercises and stretching.  Putting ice or heat on the affected area.  Medicines that help: ? To relieve pain and swelling. ? To relax your muscles.  Shots (injections) of medicines that help to relieve pain, irritation, and swelling.  Surgery. Follow these instructions at home: Medicines  Take over-the-counter and prescription medicines only as told by your doctor.  Ask your doctor if the medicine prescribed to you: ? Requires you to avoid driving or using heavy machinery. ? Can cause trouble pooping (constipation). You may need to take these steps to prevent or treat trouble pooping:  Drink enough fluids to keep your pee (urine) pale yellow.  Take over-the-counter or prescription medicines.  Eat foods that are high in fiber. These include beans, whole grains, and fresh fruits and vegetables.  Limit foods that are  high in fat and sugar. These include fried or sweet foods. Managing pain      If told, put ice on the affected area. ? Put ice in a plastic bag. ? Place a towel between your skin and the bag. ? Leave the ice on for 20 minutes, 2-3 times a day.  If told, put heat on the affected area. Use the heat source that your doctor tells you to use, such as a moist heat pack or a heating pad. ? Place a towel between your skin and the heat source. ? Leave the heat on for 20-30 minutes. ? Remove the heat if your skin turns bright red. This is very important if you are unable to feel pain, heat, or cold. You may have a greater risk of getting burned. Activity   Return  to your normal activities as told by your doctor. Ask your doctor what activities are safe for you.  Avoid activities that make your symptoms worse.  Take short rests during the day. ? When you rest for a long time, do some physical activity or stretching between periods of rest. ? Avoid sitting for a long time without moving. Get up and move around at least one time each hour.  Exercise and stretch regularly, as told by your doctor.  Do not lift anything that is heavier than 10 lb (4.5 kg) while you have symptoms of sciatica. ? Avoid lifting heavy things even when you do not have symptoms. ? Avoid lifting heavy things over and over.  When you lift objects, always lift in a way that is safe for your body. To do this, you should: ? Bend your knees. ? Keep the object close to your body. ? Avoid twisting. General instructions  Stay at a healthy weight.  Wear comfortable shoes that support your feet. Avoid wearing high heels.  Avoid sleeping on a mattress that is too soft or too hard. You might have less pain if you sleep on a mattress that is firm enough to support your back.  Keep all follow-up visits as told by your doctor. This is important. Contact a doctor if:  You have pain that: ? Wakes you up when you are  sleeping. ? Gets worse when you lie down. ? Is worse than the pain you have had in the past. ? Lasts longer than 4 weeks.  You lose weight without trying. Get help right away if:  You cannot control when you pee (urinate) or poop (have a bowel movement).  You have weakness in any of these areas and it gets worse: ? Lower back. ? The area between your hip bones. ? Butt. ? Legs.  You have redness or swelling of your back.  You have a burning feeling when you pee. Summary  Sciatica is pain, weakness, tingling, or loss of feeling (numbness) along the sciatic nerve.  This condition happens when the sciatic nerve is pinched or has pressure put on it.  Sciatica can cause pain, tingling, or loss of feeling (numbness) in the lower back, legs, hips, and butt.  Treatment often includes rest, exercise, medicines, and putting ice or heat on the affected area. This information is not intended to replace advice given to you by your health care provider. Make sure you discuss any questions you have with your health care provider. Document Revised: 03/24/2018 Document Reviewed: 03/24/2018 Elsevier Patient Education  2020 [employer].

## 2020-03-01 NOTE — Progress Notes (Signed)
Melinda Stone 39 y.o.   Chief Complaint  Patient presents with  . Back Pain    Lower area of back on the left, per patient for a long time    HISTORY OF PRESENT ILLNESS: This is a 39 y.o. female complaining of sharp pain to left lumbar area with radiation down the back of her left leg. Chronic sharp pain since the beginning of the year.  Denies injuries.  Multiple ED visits for the same. Lumbar spine MRI ordered last February but patient states it is too expensive.  No orthopedic evaluation as of yet. Denies bowel or bladder symptoms. Presently taking gabapentin, high-dose ibuprofen, and tramadol. Patient has history of diabetes.  Saw endocrinologist last October: ASSESSMENT / PLAN / RECOMMENDATIONS:   1) Type 2 Diabetes Mellitus, OPtimally controlled, With Neuropathic complications - Most recent A1c of 6.9 %. Goal A1c < 7.0 %.      - Praised the pt on improved glycemic control  - No changes will be made today     MEDICATIONS:  Continue Glipizide 5 mg BID  EDUCATION / INSTRUCTIONS:  BG monitoring instructions: Patient is instructed to check her blood sugars 2 times a day, fasting and supper time.  Call [facility] if: BG persistently < 70   I reviewed the Rule of 15 for the treatment of hypoglycemia in detail with the patient. Literature supplied.     F/U in 6 months    HPI   Prior to Admission medications   Medication Sig Start Date End Date Taking? Authorizing Provider  aspirin-acetaminophen-caffeine (EXCEDRIN MIGRAINE) [phone] MG tablet Take 2 tablets by mouth every 6 (six) hours as needed for headache. 10/20/18  Yes Georgina QuintSagardia, Jerae Izard Jose, MD  ferrous gluconate (FERGON) 324 MG tablet Take 1 tablet (324 mg total) by mouth daily with breakfast. 01/30/19  Yes Lezlie LyeSantiago Lago, Irma M, MD  glipiZIDE (GLUCOTROL) 5 MG tablet TAKE 1 TABLET BY MOUTH TWICE DAILY BEFORE A MEAL 01/18/20  Yes Shamleffer, Konrad DoloresIbtehal Jaralla, MD  gabapentin  (NEURONTIN) 600 MG tablet Take 1 tablet (600 mg total) by mouth 2 (two) times daily AND 2 tablets (1,200 mg total) at bedtime. 04/30/19 01/07/20  Lezlie LyeSantiago Lago, Meda CoffeeIrma M, MD  glucose blood [facility](ONETOUCH VERIO) test strip 2x daily 12/03/18   Shamleffer, Konrad DoloresIbtehal Jaralla, MD  ibuprofen (ADVIL) 800 MG tablet Take by mouth. Patient not taking: Reported on 03/01/2020 01/26/19   [provider]    Not on File  Patient Active Problem List   Diagnosis Date Noted  . Type 2 diabetes mellitus with diabetic polyneuropathy, without long-term current use of insulin (HCC) 12/03/2018  . Type 2 diabetes mellitus with hyperglycemia, without long-term current use of insulin (HCC) 05/15/2018    Past Medical History:  Diagnosis Date  . Diabetes mellitus without complication (HCC)   . Ovarian cyst     Past Surgical History:  Procedure Laterality Date  . CESAREAN SECTION    . CHOLECYSTECTOMY  2004  . TUBAL LIGATION  10/19/2010    Social History   Socioeconomic History  . Marital status: Single    Spouse name: Not on file  . Number of children: Not on file  . Years of education: Not on file  . Highest education level: Not on file  Occupational History  . Not on file  Tobacco Use  . Smoking status: Current Some Day Smoker  . Smokeless tobacco: Never Used  Substance and Sexual Activity  . Alcohol use: Yes  . Drug use: Yes  Types: Marijuana  . Sexual activity: Not on file  Other Topics Concern  . Not on file  Social History Narrative  . Not on file   Social Determinants of Health   Financial Resource Strain: Not on file  Food Insecurity: Not on file  Transportation Needs: Not on file  Physical Activity: Not on file  Stress: Not on file  Social Connections: Not on file  Intimate Partner Violence: Not on file    Family History  Problem Relation Age of Onset  . Diabetes Mother   . Hypertension Mother   . Stroke Mother      Review of Systems  Constitutional: Negative.   Negative for chills and fever.  HENT: Negative.  Negative for congestion and sore throat.   Respiratory: Negative.  Negative for cough and shortness of breath.   Cardiovascular: Negative.  Negative for chest pain and palpitations.  Gastrointestinal: Negative.  Negative for abdominal pain, blood in stool, diarrhea, melena, nausea and vomiting.  Genitourinary: Positive for frequency. Negative for dysuria and urgency.  Musculoskeletal: Positive for back pain.  Skin: Negative.  Negative for rash.  Neurological: Negative.  Negative for dizziness and headaches.  All other systems reviewed and are negative.     Vitals:   03/01/20 1016  BP: (!) 92/58  Pulse: (!) 121  Resp: 16  Temp: 97.6 F (36.4 C)  SpO2: 100%   Results for orders placed or performed in visit on 03/01/20 (from the past 24 hour(s))  POCT urinalysis dipstick     Status: Abnormal   Collection Time: 03/01/20 10:36 AM  Result Value Ref Range   Color, UA red (A) yellow   Clarity, UA cloudy (A) clear   Glucose, UA >=1,000 (A) negative mg/dL   Bilirubin, UA negative negative   Ketones, POC UA negative negative mg/dL   Spec Grav, UA 2.951 8.841 - 1.025   Blood, UA large (A) negative   pH, UA 5.5 5.0 - 8.0   Protein Ur, POC =30 (A) negative mg/dL   Urobilinogen, UA 0.2 0.2 or 1.0 E.U./dL   Nitrite, UA Negative Negative   Leukocytes, UA Negative Negative  POCT urine pregnancy     Status: None   Collection Time: 03/01/20 10:38 AM  Result Value Ref Range   Preg Test, Ur Negative Negative  POCT Microscopic Urinalysis ([facility])     Status: Abnormal   Collection Time: 03/01/20 10:44 AM  Result Value Ref Range   WBC,UR,HPF,POC None None WBC/hpf   RBC,UR,HPF,POC Too numerous to count  (A) None RBC/hpf   Bacteria None None, Too numerous to count   Mucus Absent Absent   Epithelial Cells, UR Per Microscopy Few (A) None, Too numerous to count cells/hpf  POCT glucose (manual entry)     Status: None   Collection Time: 03/01/20 11:06  AM  Result Value Ref Range   POC Glucose HHH 70 - 99 mg/dl  POCT glycosylated hemoglobin (Hb A1C)     Status: Abnormal   Collection Time: 03/01/20 11:11 AM  Result Value Ref Range   Hemoglobin A1C 7.4 (A) 4.0 - 5.6 %   HbA1c POC (<> result, manual entry)     HbA1c, POC (prediabetic range)     HbA1c, POC (controlled diabetic range)     BP Readings from Last 3 Encounters:  03/01/20 (!) 92/58  01/07/20 126/72  11/20/19 115/78    Physical Exam Vitals reviewed.  Constitutional:      Appearance: Normal appearance.  HENT:  Head: Normocephalic.  Eyes:     Extraocular Movements: Extraocular movements intact.     Pupils: Pupils are equal, round, and reactive to light.  Cardiovascular:     Rate and Rhythm: Regular rhythm. Tachycardia present.     Heart sounds: Normal heart sounds.  Pulmonary:     Effort: Pulmonary effort is normal.     Breath sounds: Normal breath sounds.  Abdominal:     Palpations: Abdomen is soft.     Tenderness: There is no abdominal tenderness.  Musculoskeletal:     Cervical back: Normal range of motion.     Lumbar back: Spasms and tenderness present. No bony tenderness. Decreased range of motion. Positive left straight leg raise test.  Skin:    General: Skin is warm and dry.     Capillary Refill: Capillary refill takes less than 2 seconds.  Neurological:     General: No focal deficit present.     Mental Status: She is alert and oriented to person, place, and time.  Psychiatric:        Mood and Affect: Mood normal.        Behavior: Behavior normal.      ASSESSMENT & PLAN: Patient hypotensive and tachycardic.  Very high blood glucose.  No ketones in the urine. Needs to go to the emergency department now for further evaluation and treatment. Hyperosmolar state versus DKA.  Alyssamae was seen today for back pain.  Diagnoses and all orders for this visit:  Uncontrolled type 2 diabetes mellitus with hyperglycemia (HCC) -     POCT glucose (manual  entry) -     POCT glycosylated hemoglobin (Hb A1C)  Chronic left-sided low back pain with left-sided sciatica -     Ambulatory referral to Orthopedic Surgery -     Ambulatory referral to Pain Clinic  Tachycardia  Hypotension due to hypovolemia  Left-sided back pain, unspecified back location, unspecified chronicity -     POCT Microscopic Urinalysis ([facility]) -     POCT urinalysis dipstick -     POCT urine pregnancy -     ketorolac (TORADOL) injection 60 mg -     Ambulatory referral to Orthopedic Surgery  Hyperosmolar hyperglycemic state (HHS) [facility])    Patient Instructions   Uncontrolled diabetes with hyperosmolar state, hypotension and tachycardia. Need to go to the nearest emergency room for further evaluation and treatment.    If you have lab work done today you will be contacted with your lab results within the next 2 weeks.  If you have not heard from [place] then please contact us. The fastest way to get your results is to register for My Chart.   IF you received an x-ray today, you will receive an invoice from [facility]. Please contact [facility] at [phone] with questions or concerns regarding your invoice.   IF you received labwork today, you will receive an invoice from [facility]. Please contact [facility] at [phone] with questions or concerns regarding your invoice.   Our billing staff will not be able to assist you with questions regarding bills from these companies.  You will be contacted with the lab results as soon as they are available. The fastest way to get your results is to activate your My Chart account. Instructions are located on the last page of this paperwork. If you have not heard from [place] regarding the results in 2 weeks, please contact this office.     Hyperglycemic Hyperosmolar State Hyperglycemic hyperosmolar state is a serious condition in  which you experience an extreme increase in your blood sugar (glucose) level. This makes  your body become extremely dehydrated, which can be life-threatening. This condition is a result of uncontrolled or undiagnosed diabetes. It occurs most often in people who have type 2 diabetes (type 2 diabetes mellitus). Certain hormones (insulin and glucagon) control the level of glucose that is in the blood. Insulin lowers blood glucose, and glucagon increases blood glucose. Hyperglycemia can result from having too little insulin in the bloodstream, or from the body not responding normally to insulin. Normally, the body gets rid of excess glucose through urine. If you do not drink enough fluids, or if you drink fluids that contain sugar, your body cannot get rid of excess glucose. This can result in hyperglycemic hyperosmolar state. What are the causes? This condition may be caused by:  Infection.  Medicines that cause you to become dehydrated or cause you to lose fluid.  Certain illnesses.  Not taking your diabetes medicine.  New onset or diagnosis of diabetes.  Cardiovascular disease (CVD). What increases the risk? The following factors make you more likely to develop this condition:  Older age.  Poor management of diabetes.  Inability to eat or drink normally.  Heart failure.  Infection.  Surgery.  Illness. What are the signs or symptoms? Symptoms of this condition include:  Extreme or increased thirst. This symptom may gradually disappear.  Needing to urinate more often than usual.  Dry mouth.  Warm, dry skin that does not sweat even in high temperatures.  High fever.  Sleepiness or confusion.  Vision problems or vision loss.  Seeing, hearing, tasting, smelling, or feeling things that are not real (hallucinations).  Weakness.  Weight loss.  Vomiting. How is this diagnosed? Hyperglycemic hyperosmolar state is diagnosed based on your medical history, your symptoms, and a blood test to measure your blood glucose level. How is this treated? This condition  is treated in the hospital. The goals of treatment are:  To correct dehydration by replacing fluids that you have lost. Fluids will be given through an IV tube.  To improve blood sugar levels using insulin or other medicines as needed.  To treat the cause of hyperglycemia, such as an infection, illness, or newly diagnosed diabetes. Follow these instructions at home: General instructions  Take over-the-counter and prescription medicines only as told by your health care provider.  Do not use any products that contain nicotine or tobacco, such as cigarettes and e-cigarettes. If you need help quitting, ask your health care provider.  Limit alcohol intake to no more than 1 drink a day for nonpregnant women and 2 drinks a day for men. One drink equals 12 oz of beer, 5 oz of wine, or 1 oz of hard liquor.  Stay hydrated, especially when you exercise, when you get sick, or when you spend time in hot temperatures.  Learn to manage stress. If you need help with this, ask your health care provider.  Keep all follow-up visits as told by your health care provider. This is important. Eating and drinking   Maintain a healthy weight.  Exercise regularly, as directed by your health care provider.  Eat healthy foods, such as: ? Lean proteins. ? Complex carbohydrates. ? Fresh fruits and vegetables. ? Low-fat dairy products. ? Healthy fats.  Drink enough fluid to keep your urine clear or pale yellow. If You Have Diabetes:   Make sure you know the early signs and symptoms of hyperglycemia.  Follow your diabetes management plan,  as told by your health care provider. Make sure you: ? Take your insulin and medicines as directed. ? Follow your exercise plan. ? Follow your meal plan. Eat on time, and do not skip meals. ? Check your blood glucose as often as directed. Make sure to check your blood glucose before and after exercise. If you exercise longer or in a different way than usual, check your  blood glucose more often. ? Follow your sick day plan whenever you cannot eat or drink normally. Make this plan in advance with your health care provider.  Share your diabetes management plan with people in your workplace, school, and household.  Check your urine for ketones when you are ill and as often as told by your health care provider.  Carry a medical alert card or wear medical alert jewelry. Contact a health care provider if:  You cannot eat or drink without throwing up.  You develop a fever. Get help right away if:  You develop symptoms of hyperglycemic hyperosmolar state. These symptoms may represent a serious problem that is an emergency. Do not wait to see if the symptoms will go away. Get medical help right away. Call your local emergency services (911 in the [place] Do not drive yourself to the hospital. Summary  Hyperglycemic hyperosmolar state is a serious condition in which you experience an extreme increase in your blood sugar (glucose) level. This makes your body become extremely dehydrated, which can be life-threatening.  This condition is a result of uncontrolled or undiagnosed diabetes. It occurs most often in people who have type 2 diabetes (type 2 diabetes mellitus).  This condition is treated in the hospital. Treatment may include fluids given through an IV tube and other medicines.  Make sure you know the early signs and symptoms of hyperglycemia.  Follow your diabetes management plan, as told by your health care provider. This information is not intended to replace advice given to you by your health care provider. Make sure you discuss any questions you have with your health care provider. Document Revised: 02/27/2016 Document Reviewed: 02/27/2016 Elsevier Patient Education  2020 Elsevier Inc.  Sciatica  Sciatica is pain, weakness, tingling, or loss of feeling (numbness) along the sciatic nerve. The sciatic nerve starts in the lower back and goes down the  back of each leg. Sciatica usually goes away on its own or with treatment. Sometimes, sciatica may come back (recur). What are the causes? This condition happens when the sciatic nerve is pinched or has pressure put on it. This may be the result of:  A disk in between the bones of the spine bulging out too far (herniated disk).  Changes in the spinal disks that occur with aging.  A condition that affects a muscle in the butt.  Extra bone growth near the sciatic nerve.  A break (fracture) of the area between your hip bones (pelvis).  Pregnancy.  Tumor. This is rare. What increases the risk? You are more likely to develop this condition if you:  Play sports that put pressure or stress on the spine.  Have poor strength and ease of movement (flexibility).  Have had a back injury in the past.  Have had back surgery.  Sit for long periods of time.  Do activities that involve bending or lifting over and over again.  Are very overweight (obese). What are the signs or symptoms? Symptoms can vary from mild to very bad. They may include:  Any of these problems in the lower back,  leg, hip, or butt: ? Mild tingling, loss of feeling, or dull aches. ? Burning sensations. ? Sharp pains.  Loss of feeling in the back of the calf or the sole of the foot.  Leg weakness.  Very bad back pain that makes it hard to move. These symptoms may get worse when you cough, sneeze, or laugh. They may also get worse when you sit or stand for long periods of time. How is this treated? This condition often gets better without any treatment. However, treatment may include:  Changing or cutting back on physical activity when you have pain.  Doing exercises and stretching.  Putting ice or heat on the affected area.  Medicines that help: ? To relieve pain and swelling. ? To relax your muscles.  Shots (injections) of medicines that help to relieve pain, irritation, and  swelling.  Surgery. Follow these instructions at home: Medicines  Take over-the-counter and prescription medicines only as told by your doctor.  Ask your doctor if the medicine prescribed to you: ? Requires you to avoid driving or using heavy machinery. ? Can cause trouble pooping (constipation). You may need to take these steps to prevent or treat trouble pooping:  Drink enough fluids to keep your pee (urine) pale yellow.  Take over-the-counter or prescription medicines.  Eat foods that are high in fiber. These include beans, whole grains, and fresh fruits and vegetables.  Limit foods that are high in fat and sugar. These include fried or sweet foods. Managing pain      If told, put ice on the affected area. ? Put ice in a plastic bag. ? Place a towel between your skin and the bag. ? Leave the ice on for 20 minutes, 2-3 times a day.  If told, put heat on the affected area. Use the heat source that your doctor tells you to use, such as a moist heat pack or a heating pad. ? Place a towel between your skin and the heat source. ? Leave the heat on for 20-30 minutes. ? Remove the heat if your skin turns bright red. This is very important if you are unable to feel pain, heat, or cold. You may have a greater risk of getting burned. Activity   Return to your normal activities as told by your doctor. Ask your doctor what activities are safe for you.  Avoid activities that make your symptoms worse.  Take short rests during the day. ? When you rest for a long time, do some physical activity or stretching between periods of rest. ? Avoid sitting for a long time without moving. Get up and move around at least one time each hour.  Exercise and stretch regularly, as told by your doctor.  Do not lift anything that is heavier than 10 lb (4.5 kg) while you have symptoms of sciatica. ? Avoid lifting heavy things even when you do not have symptoms. ? Avoid lifting heavy things over and  over.  When you lift objects, always lift in a way that is safe for your body. To do this, you should: ? Bend your knees. ? Keep the object close to your body. ? Avoid twisting. General instructions  Stay at a healthy weight.  Wear comfortable shoes that support your feet. Avoid wearing high heels.  Avoid sleeping on a mattress that is too soft or too hard. You might have less pain if you sleep on a mattress that is firm enough to support your back.  Keep all follow-up visits  as told by your doctor. This is important. Contact a doctor if:  You have pain that: ? Wakes you up when you are sleeping. ? Gets worse when you lie down. ? Is worse than the pain you have had in the past. ? Lasts longer than 4 weeks.  You lose weight without trying. Get help right away if:  You cannot control when you pee (urinate) or poop (have a bowel movement).  You have weakness in any of these areas and it gets worse: ? Lower back. ? The area between your hip bones. ? Butt. ? Legs.  You have redness or swelling of your back.  You have a burning feeling when you pee. Summary  Sciatica is pain, weakness, tingling, or loss of feeling (numbness) along the sciatic nerve.  This condition happens when the sciatic nerve is pinched or has pressure put on it.  Sciatica can cause pain, tingling, or loss of feeling (numbness) in the lower back, legs, hips, and butt.  Treatment often includes rest, exercise, medicines, and putting ice or heat on the affected area. This information is not intended to replace advice given to you by your health care provider. Make sure you discuss any questions you have with your health care provider. Document Revised: 03/24/2018 Document Reviewed: 03/24/2018 Elsevier Patient Education  2020 Elsevier Inc.      Edwina Barth, MD [facility]

## 2020-03-03 ENCOUNTER — Encounter: Payer: Self-pay | Admitting: Emergency Medicine

## 2020-03-03 LAB — HM DIABETES EYE EXAM

## 2020-03-07 ENCOUNTER — Encounter: Payer: Self-pay | Admitting: *Deleted

## 2020-03-17 ENCOUNTER — Other Ambulatory Visit: Payer: Self-pay | Admitting: Internal Medicine

## 2020-03-21 ENCOUNTER — Encounter: Payer: Self-pay | Admitting: Family Medicine

## 2020-03-21 ENCOUNTER — Ambulatory Visit: Payer: Self-pay | Admitting: Family Medicine

## 2020-03-21 ENCOUNTER — Ambulatory Visit (INDEPENDENT_AMBULATORY_CARE_PROVIDER_SITE_OTHER): Payer: Self-pay

## 2020-03-21 DIAGNOSIS — M5441 Lumbago with sciatica, right side: Secondary | ICD-10-CM

## 2020-03-21 DIAGNOSIS — G8929 Other chronic pain: Secondary | ICD-10-CM

## 2020-03-21 DIAGNOSIS — M5442 Lumbago with sciatica, left side: Secondary | ICD-10-CM

## 2020-03-21 MED ORDER — DULOXETINE HCL 30 MG PO CPEP: 30.0000 mg | ORAL_CAPSULE | Freq: Every day | ORAL | 3 refills | Status: DC

## 2020-03-21 NOTE — Progress Notes (Signed)
I saw and examined the patient with Dr. Marga Hoots and agree with assessment and plan as outlined.    She has felt bad for at least 2 years.  There was no injury to cause her pain.  At first she was having sciatica pain, but then about a month ago she started hurting all over.  She was able to work until about a month ago.  She has been treated with gabapentin which has not helped.  She is working to keep her blood sugars under control and her most recent A1c was below 7.  There is no family history of musculoskeletal problems like this.  X-rays today reveal moderate to severe L4-5 degenerative disc disease and moderate at L5-S1.  There is mild arthritic change at the SI joints.  Hip joints look well-preserved.  No sign of neoplasm or compression fracture.  We gave her information about the [facility] assistance program and [facility].  Once she gets help with expenses, will consider lab evaluation and MRI of lumbar spine.  Will try Cymbalta as well.

## 2020-03-21 NOTE — Progress Notes (Signed)
Office Visit Note   Patient: Melinda Stone           Date of Birth: 1980-10-06           MRN: [id number] Visit Date: 03/21/2020 Requested by: Georgina Quint, MD [address] [place],  [place] [address] PCP: Georgina Quint, MD  Subjective: Chief Complaint  Patient presents with  . Lower Back - Pain    Chronic pain in the lower back, bilaterally, with pain down the posterior legs to her feet. + n/t. NKI. Difficulty walking x 1 month. Does not use any ambulatory aid. She has no insurance, so she has been going to the ED for care, receiving a small Rx for muscle relaxers, aniinflammatories, etc.    HPI: 40yo F, with poor medical history/care due to lack of insurance/PCM, who is presenting to clinic today with concerns of 1 year of low back pain/sciatica, who has steadily progressed over the past few months, that now she has pain 'all over.' Patient states that she was able to continue working at [employer] until last month, when her pain became unbearable. She has been seen at the ED numerous times, and given acute medications for flares, but states 'they don't help.' She was given Gabapentin by the health department, and doesn't think this offers much improvement, either. States that her pain initially started on the left, with radiation into her left leg. Then it progressed to bilateral. Now, pain is throughout both of her legs, and she endorses a 'tingling' sensation everywhere. States she has severe pain on both sides of her lower back, but also now into her upper back, to the point where 'I had to ask my daughter to put my hair into a ponytail because I just couldn't stand reaching up.' She is able to ambulate at home, holding into a wall or counter for support, but uses a wheelchair in public places. States that, until last year, she felt 'fine.' She was diagnosed at age 68 with diabetes, however says she has been careful about what she eats.               ROS:   All other  systems were reviewed and are negative.  Objective: Vital Signs: LMP 02/29/2020   Physical Exam:  General:  Alert and oriented, in no acute distress. Pulm:  Breathing unlabored. Psy:  Normal mood, congruent affect. Skin:  No bruising, rashes.   BACK EXAMINATION: Uses wheelchair. Is able to stand, but does so stiffly, holding onto counter for support.  Normal Spinal curvature, without excessive lumbar lordosis, thoracic kyphosis, or scoliosis.  ROM: Endorses significant pain throughout ROM testing. Unable to flex beyond 40*, does not tolerate any extension.  Palpation: Diffusely tender to palpation throughout lower back and lower legs. States pain is most significant over bilateral SI Joints, but also throughout lumbar paraspinal muscles bilaterally. No obvious step-offs.  Strength: Hip flexion (L1), Hip Aduction (L2), Knee Extension (L3) are 4/5 Bilaterally, likely due to pain.  Foot Inversion (L4), Dorsiflexion (L5), and Eversion (S1) 4/5 Bilaterally, likely due to pain.  Sensation: Endorses reduced sensation throughout entirety of bilateral lower extremities, involving all dermatomes.   Reflexes: Patellar (L4), and Ankle (S1) difficult to obtain bilaterally due to guarding, pain with reflex hammer.  Special Tests:  SLR: Endorses radiation down Ipilateral and contralateral leg bilaterally Does not tolerate FABER.   Imaging: XR Lumbar Spine 2-3 Views  Result Date: 03/21/2020 X-rays reveal moderate to severe L4-5 degenerative disc disease and  moderate at L5-S1.  There is mild arthritic change at the SI joints.  Hip joints look well-preserved.  No sign of neoplasm or compression fracture.   Assessment & Plan: 40yo F presenting to clinic with severe bilateral lower back/leg pain, which recently extended to be 'everywhere.' Poorly controlled with current medications (Gabapentin). Likely complicated by nearly 48yr history of DM, though last A1C was well controlled.  - Given significant,  diffuse pain, concern for underlying autoimmune disorder. She does have severe degenerative changes at L4-L5, and moderate degeneration of L5-S1 as well as within bilateral SI Joints, and would likely benefit from MRI for further evaluation. - Given information for [facility] program. Once she is able to complete this paperwork, she is instructed to contact clinic, at which time we can begin evaluation- which would be prohibitively expensive otherwise.  - Will initiate Cymbalta - Patient expresses understanding. She will contact clinic when able to begin laboratory and advanced imaging evaluation.       Procedures: No procedures performed        PMFS History: Patient Active Problem List   Diagnosis Date Noted  . Type 2 diabetes mellitus with diabetic polyneuropathy, without long-term current use of insulin (HCC) 12/03/2018  . Type 2 diabetes mellitus with hyperglycemia, without long-term current use of insulin (HCC) 05/15/2018   Past Medical History:  Diagnosis Date  . Diabetes mellitus without complication (HCC)   . Ovarian cyst     Family History  Problem Relation Age of Onset  . Diabetes Mother   . Hypertension Mother   . Stroke Mother     Past Surgical History:  Procedure Laterality Date  . CESAREAN SECTION    . CHOLECYSTECTOMY  2004  . TUBAL LIGATION  10/19/2010   Social History   Occupational History  . Not on file  Tobacco Use  . Smoking status: Current Some Day Smoker  . Smokeless tobacco: Never Used  Substance and Sexual Activity  . Alcohol use: Yes  . Drug use: Yes    Types: Marijuana  . Sexual activity: Not on file

## 2020-03-28 ENCOUNTER — Other Ambulatory Visit: Payer: Self-pay | Admitting: Family Medicine

## 2020-03-28 ENCOUNTER — Telehealth: Payer: Self-pay | Admitting: Family Medicine

## 2020-03-28 DIAGNOSIS — M5441 Lumbago with sciatica, right side: Secondary | ICD-10-CM

## 2020-03-28 DIAGNOSIS — G8929 Other chronic pain: Secondary | ICD-10-CM

## 2020-03-28 NOTE — Telephone Encounter (Signed)
Pt called stating her insurance will be effective 04/19/20 and Dr.Hilts had mentioned setting up a MRI for her back once her insurance started. Pt would like to know if Dr. Prince Rome will want to see her back in office before he puts in the referral or could he start the process of setting her up for an MRI now? If pt does need appt she has one set for 04/20/20; if not though she would like to be notified.   [phone]

## 2020-03-28 NOTE — Telephone Encounter (Signed)
Ordered

## 2020-03-28 NOTE — Telephone Encounter (Signed)
Please advise 

## 2020-03-28 NOTE — Telephone Encounter (Signed)
I called and advised the patient the MRI has been ordered and the imaging facility will call her to schedule this. Cancelled the 2//02/22 appointment. Advised her that we call with results usually, but she may come in for office visit if she prefers - she will decide on that once she has an MRI appointment.

## 2020-04-13 NOTE — Telephone Encounter (Signed)
pt needs to reschedule due to provider out of office

## 2020-04-20 ENCOUNTER — Ambulatory Visit: Payer: Medicaid Other | Admitting: Family Medicine

## 2020-04-21 ENCOUNTER — Other Ambulatory Visit: Payer: Self-pay

## 2020-04-21 ENCOUNTER — Ambulatory Visit (HOSPITAL_COMMUNITY)
Admission: RE | Admit: 2020-04-21 | Discharge: 2020-04-21 | Disposition: A | Discharge status: Home / Self Care | Payer: Medicaid Other | Source: Ambulatory Visit | Attending: Family Medicine | Admitting: Family Medicine

## 2020-04-21 DIAGNOSIS — M5442 Lumbago with sciatica, left side: Secondary | ICD-10-CM | POA: Diagnosis not present

## 2020-04-21 DIAGNOSIS — G8929 Other chronic pain: Secondary | ICD-10-CM | POA: Diagnosis present

## 2020-04-21 DIAGNOSIS — M5441 Lumbago with sciatica, right side: Secondary | ICD-10-CM | POA: Diagnosis present

## 2020-04-22 ENCOUNTER — Telehealth: Payer: Self-pay | Admitting: Family Medicine

## 2020-04-22 ENCOUNTER — Telehealth: Payer: Self-pay

## 2020-04-22 ENCOUNTER — Telehealth: Payer: Self-pay | Admitting: Orthopaedic Surgery

## 2020-04-22 MED ORDER — TRAMADOL HCL 50 MG PO TABS: 50.0000 mg | ORAL_TABLET | Freq: Four times a day (QID) | ORAL | 0 refills | Status: DC | PRN

## 2020-04-22 NOTE — Telephone Encounter (Signed)
Please advise. The patient will be seeing Dr. Ophelia Charter on 2/15, which was his first available appointment (not in the clinic next week).

## 2020-04-22 NOTE — Telephone Encounter (Signed)
Yes, see other note

## 2020-04-22 NOTE — Telephone Encounter (Signed)
I called and advised the patient of the MRI results and need for surgical consult with Dr. Ophelia Charter. She will await a call from Dr. Ophelia Charter' assistant.

## 2020-04-22 NOTE — Telephone Encounter (Signed)
MRI scan shows a very large disc herniation at L4-5 causing severe narrowing of the spinal canal and compression of nerves.  There is another disc protrusion at L5-S1 causing narrowing of the spinal canal.  I recommend consultation with Dr. Ophelia Charter to discuss surgical options.

## 2020-04-22 NOTE — Telephone Encounter (Signed)
I called and immediately reached the patient's voice mail. Asked her to call me back regarding results.

## 2020-04-22 NOTE — Telephone Encounter (Signed)
I called patient and advised Dr. Ophelia Charter is out of the office next week, however, we could work her in to his schedule on 05/03/2020 which is his first day back in clinic. Patient scheduled for 05/03/2020 at 0815.

## 2020-04-22 NOTE — Telephone Encounter (Signed)
Please advise 

## 2020-04-22 NOTE — Telephone Encounter (Signed)
Lee from [facility] radiology called and would like to know if Dr. Prince Rome has recieved/reviewed MRI [facility]. would like a CB to confirm.    CB [phone]

## 2020-04-22 NOTE — Telephone Encounter (Signed)
Pt called and said her back is in pain. She was wondering if she can get something prescribed to help until she sees Dr.Yates.

## 2020-04-22 NOTE — Telephone Encounter (Signed)
I called and advised the patient Tramadol was sent in to her pharmacy.

## 2020-04-22 NOTE — Addendum Note (Signed)
Addended by: Lillia Carmel on: 04/22/2020 02:01 PM   Modules accepted: Orders

## 2020-04-22 NOTE — Telephone Encounter (Signed)
Error

## 2020-04-25 ENCOUNTER — Telehealth: Payer: Self-pay | Admitting: Orthopaedic Surgery

## 2020-04-25 NOTE — Telephone Encounter (Signed)
FYI   I called patient and advised Melinda Stone had submitted x 3 and left voicemail at the pharmacy with diagnosis code in hopes that patient can get medication. I explained that when information was submitted, she was not recognized in system. I also explained that this may be something she has to pay out of pocket for. She states that something was incorrect with Medicaid and that she had called them to get it straight. She plans to call them again tomorrow and let them know that she is trying to get medication approved and all steps that have been taken. She will call us back if there is anything that we can do further for her.

## 2020-04-25 NOTE — Telephone Encounter (Signed)
Patient called stating for Dr. Ophelia Charter or Tamela Oddi to call pharmacy on file and give reason patient needing the tramadol. Patient is also asking for a call back when medication has been cleared to pick up. Patient phone number is [phone].

## 2020-04-25 NOTE — Telephone Encounter (Signed)
I called pharmacy. [name] states that medication requires prior auth through [place]. They have faxed information to our office.

## 2020-04-25 NOTE — Telephone Encounter (Signed)
Prior auth submitted by Camelia Eng.

## 2020-04-28 ENCOUNTER — Ambulatory Visit: Payer: Medicaid Other | Admitting: Emergency Medicine

## 2020-04-28 ENCOUNTER — Encounter: Payer: Self-pay | Admitting: Emergency Medicine

## 2020-04-28 ENCOUNTER — Other Ambulatory Visit: Payer: Self-pay

## 2020-04-28 VITALS — BP 106/70 | HR 97 | Temp 98.4°F | Resp 16 | Ht 69.0 in | Wt 192.0 lb

## 2020-04-28 DIAGNOSIS — G8929 Other chronic pain: Secondary | ICD-10-CM

## 2020-04-28 DIAGNOSIS — E1165 Type 2 diabetes mellitus with hyperglycemia: Secondary | ICD-10-CM | POA: Diagnosis not present

## 2020-04-28 DIAGNOSIS — M5441 Lumbago with sciatica, right side: Secondary | ICD-10-CM

## 2020-04-28 DIAGNOSIS — M5442 Lumbago with sciatica, left side: Secondary | ICD-10-CM | POA: Diagnosis not present

## 2020-04-28 LAB — COMPREHENSIVE METABOLIC PANEL
ALT: 12 IU/L (ref 0–32)
AST: 15 IU/L (ref 0–40)
Albumin/Globulin Ratio: 1.7 (ref 1.2–2.2)
Albumin: 4 g/dL (ref 3.8–4.8)
Alkaline Phosphatase: 82 IU/L (ref 44–121)
BUN/Creatinine Ratio: 7 — ABNORMAL LOW (ref 9–23)
BUN: 4 mg/dL — ABNORMAL LOW (ref 6–20)
Bilirubin Total: 1.7 mg/dL — ABNORMAL HIGH (ref 0.0–1.2)
CO2: 21 mmol/L (ref 20–29)
Calcium: 8.5 mg/dL — ABNORMAL LOW (ref 8.7–10.2)
Chloride: 101 mmol/L (ref 96–106)
Creatinine, Ser: 0.54 mg/dL — ABNORMAL LOW (ref 0.57–1.00)
GFR calc Af Amer: 139 mL/min/{1.73_m2} (ref >59)
GFR calc non Af Amer: 121 mL/min/{1.73_m2} (ref >59)
Globulin, Total: 2.3 g/dL (ref 1.5–4.5)
Glucose: 270 mg/dL — ABNORMAL HIGH (ref 65–99)
Potassium: 3.7 mmol/L (ref 3.5–5.2)
Sodium: 136 mmol/L (ref 134–144)
Total Protein: 6.3 g/dL (ref 6.0–8.5)

## 2020-04-28 LAB — LIPID PANEL
Chol/HDL Ratio: 1.7 ratio (ref 0.0–4.4)
Cholesterol, Total: 96 mg/dL — ABNORMAL LOW (ref 100–199)
HDL: 55 mg/dL (ref >39)
LDL Chol Calc (NIH): 23 mg/dL (ref 0–99)
Triglycerides: 97 mg/dL (ref 0–149)
VLDL Cholesterol Cal: 18 mg/dL (ref 5–40)

## 2020-04-28 LAB — POCT GLYCOSYLATED HEMOGLOBIN (HGB A1C): Hemoglobin A1C: 6.5 % — AB (ref 4.0–5.6)

## 2020-04-28 LAB — GLUCOSE, POCT (MANUAL RESULT ENTRY): POC Glucose: 297 mg/dl — AB (ref 70–99)

## 2020-04-28 MED ORDER — GLIPIZIDE 10 MG PO TABS: 10.0000 mg | ORAL_TABLET | Freq: Two times a day (BID) | ORAL | 3 refills | Status: DC

## 2020-04-28 MED ORDER — GLIPIZIDE 5 MG PO TABS
ORAL_TABLET | ORAL | 3 refills | Status: DC
Start: 2020-04-28 — End: 2020-04-28

## 2020-04-28 MED ORDER — GABAPENTIN 600 MG PO TABS: ORAL_TABLET | ORAL | 1 refills | Status: DC

## 2020-04-28 NOTE — Assessment & Plan Note (Signed)
Hemoglobin A1c at 6.5 better than before.  Continue glipizide 10 mg twice a day.  Intolerant to Metformin due to GI side effects. Diet and nutrition discussed. Follow-up in 6 months.

## 2020-04-28 NOTE — Progress Notes (Signed)
Melinda Stone 40 y.o.   Chief Complaint  Patient presents with  . Medication Refill    Gabapentin and Glipizide  . Leg Pain    Per patient both legs and they feel tight for one month    HISTORY OF PRESENT ILLNESS: This is a 40 y.o. female with history of diabetes since she was 40 years old, presently on glipizide 5 mg twice a day.  Intolerant to Metformin.  Not insulin-dependent. Also has history of chronic back pain, sees Dr. Ophelia CharterYates, orthopedic surgeon on a regular basis.  Recently had lumbar spine MRI.  Scheduled to see orthopedist next Tuesday for evaluation and MRI review.  May need surgery. Also has chronic neuropathic pain, on gabapentin several times a day.  Also takes Cymbalta daily. Fully vaccinated against Covid. Here for follow-up and medication refill.  HPI   Prior to Admission medications   Medication Sig Start Date End Date Taking? Authorizing Provider  DULoxetine (CYMBALTA) 30 MG capsule Take 1 capsule (30 mg total) by mouth daily. 03/21/20  Yes Hilts, Casimiro NeedleMichael, MD  ferrous gluconate (FERGON) 324 MG tablet Take 1 tablet (324 mg total) by mouth daily with breakfast. 01/30/19  Yes Lezlie LyeSantiago Lago, Meda CoffeeIrma M, MD  traMADol (ULTRAM) 50 MG tablet Take 1 tablet (50 mg total) by mouth every 6 (six) hours as needed. 04/22/20  Yes Hilts, Casimiro NeedleMichael, MD  acetaminophen (TYLENOL) 500 MG tablet Take by mouth. Patient not taking: Reported on 04/28/2020    [provider]  gabapentin (NEURONTIN) 600 MG tablet Take 1 tablet (600 mg total) by mouth 2 (two) times daily AND 2 tablets (1,200 mg total) at bedtime. 04/28/20 01/05/21  Georgina QuintSagardia, Debrah Granderson Jose, MD  glipiZIDE (GLUCOTROL) 5 MG tablet TAKE 1 TABLET BY MOUTH TWICE DAILY BEFORE A MEAL 04/28/20   Georgina QuintSagardia, Alyzza Andringa Jose, MD  glucose blood [facility](ONETOUCH VERIO) test strip 2x daily 12/03/18   Shamleffer, Konrad DoloresIbtehal Jaralla, MD    No Known Allergies  Patient Active Problem List   Diagnosis Date Noted  . Type 2 diabetes mellitus with diabetic  polyneuropathy, without long-term current use of insulin (HCC) 12/03/2018  . Type 2 diabetes mellitus with hyperglycemia, without long-term current use of insulin (HCC) 05/15/2018    Past Medical History:  Diagnosis Date  . Diabetes mellitus without complication (HCC)   . Ovarian cyst     Past Surgical History:  Procedure Laterality Date  . CESAREAN SECTION    . CHOLECYSTECTOMY  2004  . TUBAL LIGATION  10/19/2010    Social History   Socioeconomic History  . Marital status: Single    Spouse name: Not on file  . Number of children: Not on file  . Years of education: Not on file  . Highest education level: Not on file  Occupational History  . Not on file  Tobacco Use  . Smoking status: Current Some Day Smoker  . Smokeless tobacco: Never Used  Substance and Sexual Activity  . Alcohol use: Yes  . Drug use: Yes    Types: Marijuana  . Sexual activity: Not on file  Other Topics Concern  . Not on file  Social History Narrative  . Not on file   Social Determinants of Health   Financial Resource Strain: Not on file  Food Insecurity: Not on file  Transportation Needs: Not on file  Physical Activity: Not on file  Stress: Not on file  Social Connections: Not on file  Intimate Partner Violence: Not on file    Family History  Problem  Relation Age of Onset  . Diabetes Mother   . Hypertension Mother   . Stroke Mother      Review of Systems  Constitutional: Negative.  Negative for chills and fever.  HENT: Negative.  Negative for congestion and sore throat.   Respiratory: Negative.  Negative for cough and shortness of breath.   Cardiovascular: Negative.  Negative for chest pain and palpitations.  Gastrointestinal: Negative.  Negative for abdominal pain, diarrhea, nausea and vomiting.  Genitourinary: Negative.  Negative for dysuria and hematuria.  Musculoskeletal: Positive for back pain.       Chronic bilateral leg pain  Skin: Negative.  Negative for rash.   Neurological: Negative.  Negative for dizziness and headaches.  All other systems reviewed and are negative.  Today's Vitals   04/28/20 0902  BP: 106/70  Pulse: 97  Resp: 16  Temp: 98.4 F (36.9 C)  TempSrc: Temporal  SpO2: 100%  Weight: 192 lb (87.1 kg)  Height: 5\' 9"  (1.753 m)   Body mass index is 28.35 kg/m.   Physical Exam Vitals reviewed.  Constitutional:      Appearance: Normal appearance.  HENT:     Head: Normocephalic.  Eyes:     Extraocular Movements: Extraocular movements intact.     Conjunctiva/sclera: Conjunctivae normal.     Pupils: Pupils are equal, round, and reactive to light.  Cardiovascular:     Rate and Rhythm: Normal rate and regular rhythm.     Pulses: Normal pulses.     Heart sounds: Normal heart sounds.  Pulmonary:     Effort: Pulmonary effort is normal.     Breath sounds: Normal breath sounds.  Musculoskeletal:     Cervical back: Normal range of motion and neck supple.  Skin:    General: Skin is warm and dry.     Capillary Refill: Capillary refill takes less than 2 seconds.  Neurological:     General: No focal deficit present.     Mental Status: She is alert and oriented to person, place, and time.  Psychiatric:        Mood and Affect: Mood normal.        Behavior: Behavior normal.    Results for orders placed or performed in visit on 04/28/20 (from the past 24 hour(s))  POCT glycosylated hemoglobin (Hb A1C)     Status: Abnormal   Collection Time: 04/28/20  9:16 AM  Result Value Ref Range   Hemoglobin A1C 6.5 (A) 4.0 - 5.6 %   HbA1c POC (<> result, manual entry)     HbA1c, POC (prediabetic range)     HbA1c, POC (controlled diabetic range)    POCT glucose (manual entry)     Status: Abnormal   Collection Time: 04/28/20  9:26 AM  Result Value Ref Range   POC Glucose 297 (A) 70 - 99 mg/dl   A total of 30 minutes was spent with the patient, greater than 50% of which was in counseling/coordination of care regarding diabetes and  cardiovascular risks associated with this condition, review of all medications, review of most recent blood work results including today's hemoglobin A1c, education on nutrition, review of most recent office visit notes, health maintenance items, prognosis, need for follow-up with orthopedist for chronic lumbar pain and MRI of lumbar spine reviewed, documentation, need for follow-up.   ASSESSMENT & PLAN: Type 2 diabetes mellitus with hyperglycemia, without long-term current use of insulin (HCC) Hemoglobin A1c at 6.5 better than before.  Continue glipizide 10 mg twice a day.  Intolerant to Metformin due to GI side effects. Diet and nutrition discussed. Follow-up in 6 months.  Melinda Stone was seen today for medication refill and leg pain.  Diagnoses and all orders for this visit:  Type 2 diabetes mellitus with hyperglycemia, without long-term current use of insulin (HCC) -     POCT glycosylated hemoglobin (Hb A1C) -     Comprehensive metabolic panel -     Lipid panel -     gabapentin (NEURONTIN) 600 MG tablet; Take 1 tablet (600 mg total) by mouth 2 (two) times daily AND 2 tablets (1,200 mg total) at bedtime. -     POCT glucose (manual entry) -     glipiZIDE (GLUCOTROL) 10 MG tablet; Take 1 tablet (10 mg total) by mouth 2 (two) times daily before a meal.  Chronic bilateral low back pain with bilateral sciatica  Other orders -     Discontinue: glipiZIDE (GLUCOTROL) 5 MG tablet; TAKE 1 TABLET BY MOUTH TWICE DAILY BEFORE A MEAL    Patient Instructions       If you have lab work done today you will be contacted with your lab results within the next 2 weeks.  If you have not heard from [place] then please contact us. The fastest way to get your results is to register for My Chart.   IF you received an x-ray today, you will receive an invoice from [facility]. Please contact [facility] at [phone] with questions or concerns regarding your invoice.   IF you received  labwork today, you will receive an invoice from [facility]. Please contact [facility] at [phone] with questions or concerns regarding your invoice.   Our billing staff will not be able to assist you with questions regarding bills from these companies.  You will be contacted with the lab results as soon as they are available. The fastest way to get your results is to activate your My Chart account. Instructions are located on the last page of this paperwork. If you have not heard from [place] regarding the results in 2 weeks, please contact this office.     Diabetes Mellitus and Nutrition, Adult When you have diabetes, or diabetes mellitus, it is very important to have healthy eating habits because your blood sugar (glucose) levels are greatly affected by what you eat and drink. Eating healthy foods in the right amounts, at about the same times every day, can help you:  Control your blood glucose.  Lower your risk of heart disease.  Improve your blood pressure.  Reach or maintain a healthy weight. What can affect my meal plan? Every person with diabetes is different, and each person has different needs for a meal plan. Your health care provider may recommend that you work with a dietitian to make a meal plan that is best for you. Your meal plan may vary depending on factors such as:  The calories you need.  The medicines you take.  Your weight.  Your blood glucose, blood pressure, and cholesterol levels.  Your activity level.  Other health conditions you have, such as heart or kidney disease. How do carbohydrates affect me? Carbohydrates, also called carbs, affect your blood glucose level more than any other type of food. Eating carbs naturally raises the amount of glucose in your blood. Carb counting is a method for keeping track of how many carbs you eat. Counting carbs is important to keep your blood glucose at a healthy level, especially if you use insulin or take certain oral  diabetes medicines. It is important to know how many carbs you can safely have in each meal. This is different for every person. Your dietitian can help you calculate how many carbs you should have at each meal and for each snack. How does alcohol affect me? Alcohol can cause a sudden decrease in blood glucose (hypoglycemia), especially if you use insulin or take certain oral diabetes medicines. Hypoglycemia can be a life-threatening condition. Symptoms of hypoglycemia, such as sleepiness, dizziness, and confusion, are similar to symptoms of having too much alcohol.  Do not drink alcohol if: ? Your health care provider tells you not to drink. ? You are pregnant, may be pregnant, or are planning to become pregnant.  If you drink alcohol: ? Do not drink on an empty stomach. ? Limit how much you use to:  0-1 drink a day for women.  0-2 drinks a day for men. ? Be aware of how much alcohol is in your drink. In the [place] one drink equals one 12 oz bottle of beer (355 mL), one 5 oz glass of wine (148 mL), or one 1 oz glass of hard liquor (44 mL). ? Keep yourself hydrated with water, diet soda, or unsweetened iced tea.  Keep in mind that regular soda, juice, and other mixers may contain a lot of sugar and must be counted as carbs. What are tips for following this plan? Reading food labels  Start by checking the serving size on the "Nutrition Facts" label of packaged foods and drinks. The amount of calories, carbs, fats, and other nutrients listed on the label is based on one serving of the item. Many items contain more than one serving per package.  Check the total grams (g) of carbs in one serving. You can calculate the number of servings of carbs in one serving by dividing the total carbs by 15. For example, if a food has 30 g of total carbs per serving, it would be equal to 2 servings of carbs.  Check the number of grams (g) of saturated fats and trans fats in one serving. Choose foods that  have a low amount or none of these fats.  Check the number of milligrams (mg) of salt (sodium) in one serving. Most people should limit total sodium intake to less than 2,300 mg per day.  Always check the nutrition information of foods labeled as "low-fat" or "nonfat." These foods may be higher in added sugar or refined carbs and should be avoided.  Talk to your dietitian to identify your daily goals for nutrients listed on the label. Shopping  Avoid buying canned, pre-made, or processed foods. These foods tend to be high in fat, sodium, and added sugar.  Shop around the outside edge of the grocery store. This is where you will most often find fresh fruits and vegetables, bulk grains, fresh meats, and fresh dairy. Cooking  Use low-heat cooking methods, such as baking, instead of high-heat cooking methods like deep frying.  Cook using healthy oils, such as olive, canola, or sunflower oil.  Avoid cooking with butter, cream, or high-fat meats. Meal planning  Eat meals and snacks regularly, preferably at the same times every day. Avoid going long periods of time without eating.  Eat foods that are high in fiber, such as fresh fruits, vegetables, beans, and whole grains. Talk with your dietitian about how many servings of carbs you can eat at each meal.  Eat 4-6 oz (112-168 g) of lean protein each day, such as lean  meat, chicken, fish, eggs, or tofu. One ounce (oz) of lean protein is equal to: ? 1 oz (28 g) of meat, chicken, or fish. ? 1 egg. ?  cup (62 g) of tofu.  Eat some foods each day that contain healthy fats, such as avocado, nuts, seeds, and fish.   What foods should I eat? Fruits Berries. Apples. Oranges. Peaches. Apricots. Plums. Grapes. Mango. Papaya. Pomegranate. Kiwi. Cherries. Vegetables Lettuce. Spinach. Leafy greens, including kale, chard, collard greens, and mustard greens. Beets. Cauliflower. Cabbage. Broccoli. Carrots. Green beans. Tomatoes. Peppers. Onions.  Cucumbers. Brussels sprouts. Grains Whole grains, such as whole-wheat or whole-grain bread, crackers, tortillas, cereal, and pasta. Unsweetened oatmeal. Quinoa. Brown or wild rice. Meats and other proteins Seafood. Poultry without skin. Lean cuts of poultry and beef. Tofu. Nuts. Seeds. Dairy Low-fat or fat-free dairy products such as milk, yogurt, and cheese. The items listed above may not be a complete list of foods and beverages you can eat. Contact a dietitian for more information. What foods should I avoid? Fruits Fruits canned with syrup. Vegetables Canned vegetables. Frozen vegetables with butter or cream sauce. Grains Refined white flour and flour products such as bread, pasta, snack foods, and cereals. Avoid all processed foods. Meats and other proteins Fatty cuts of meat. Poultry with skin. Breaded or fried meats. Processed meat. Avoid saturated fats. Dairy Full-fat yogurt, cheese, or milk. Beverages Sweetened drinks, such as soda or iced tea. The items listed above may not be a complete list of foods and beverages you should avoid. Contact a dietitian for more information. Questions to ask a health care provider  Do I need to meet with a diabetes educator?  Do I need to meet with a dietitian?  What number can I call if I have questions?  When are the best times to check my blood glucose? Where to find more information:  American Diabetes Association: diabetes.org  Academy of Nutrition and Dietetics: www.eatright.[employer] of Diabetes and Digestive and Kidney Diseases: CarFlippers.tn  Association of Diabetes Care and Education Specialists: www.diabeteseducator.org Summary  It is important to have healthy eating habits because your blood sugar (glucose) levels are greatly affected by what you eat and drink.  A healthy meal plan will help you control your blood glucose and maintain a healthy lifestyle.  Your health care provider may recommend that  you work with a dietitian to make a meal plan that is best for you.  Keep in mind that carbohydrates (carbs) and alcohol have immediate effects on your blood glucose levels. It is important to count carbs and to use alcohol carefully. This information is not intended to replace advice given to you by your health care provider. Make sure you discuss any questions you have with your health care provider. Document Revised: 02/10/2019 Document Reviewed: 02/10/2019 Elsevier Patient Education  2021 Elsevier Inc.      Edwina Barth, MD [facility]

## 2020-04-28 NOTE — Patient Instructions (Addendum)
   If you have lab work done today you will be contacted with your lab results within the next 2 weeks.  If you have not heard from us then please contact us. The fastest way to get your results is to register for My Chart.   IF you received an x-ray today, you will receive an invoice from [facility]. Please contact [facility] at [phone] with questions or concerns regarding your invoice.   IF you received labwork today, you will receive an invoice from [facility]. Please contact [facility] at [phone] with questions or concerns regarding your invoice.   Our billing staff will not be able to assist you with questions regarding bills from these companies.  You will be contacted with the lab results as soon as they are available. The fastest way to get your results is to activate your My Chart account. Instructions are located on the last page of this paperwork. If you have not heard from us regarding the results in 2 weeks, please contact this office.     Diabetes Mellitus and Nutrition, Adult When you have diabetes, or diabetes mellitus, it is very important to have healthy eating habits because your blood sugar (glucose) levels are greatly affected by what you eat and drink. Eating healthy foods in the right amounts, at about the same times every day, can help you:  Control your blood glucose.  Lower your risk of heart disease.  Improve your blood pressure.  Reach or maintain a healthy weight. What can affect my meal plan? Every person with diabetes is different, and each person has different needs for a meal plan. Your health care provider may recommend that you work with a dietitian to make a meal plan that is best for you. Your meal plan may vary depending on factors such as:  The calories you need.  The medicines you take.  Your weight.  Your blood glucose, blood pressure, and cholesterol levels.  Your activity level.  Other health conditions you  have, such as heart or kidney disease. How do carbohydrates affect me? Carbohydrates, also called carbs, affect your blood glucose level more than any other type of food. Eating carbs naturally raises the amount of glucose in your blood. Carb counting is a method for keeping track of how many carbs you eat. Counting carbs is important to keep your blood glucose at a healthy level, especially if you use insulin or take certain oral diabetes medicines. It is important to know how many carbs you can safely have in each meal. This is different for every person. Your dietitian can help you calculate how many carbs you should have at each meal and for each snack. How does alcohol affect me? Alcohol can cause a sudden decrease in blood glucose (hypoglycemia), especially if you use insulin or take certain oral diabetes medicines. Hypoglycemia can be a life-threatening condition. Symptoms of hypoglycemia, such as sleepiness, dizziness, and confusion, are similar to symptoms of having too much alcohol.  Do not drink alcohol if: ? Your health care provider tells you not to drink. ? You are pregnant, may be pregnant, or are planning to become pregnant.  If you drink alcohol: ? Do not drink on an empty stomach. ? Limit how much you use to:  0-1 drink a day for women.  0-2 drinks a day for men. ? Be aware of how much alcohol is in your drink. In the [place] one drink equals one 12 oz bottle of beer (355 mL),   one 5 oz glass of wine (148 mL), or one 1 oz glass of hard liquor (44 mL). ? Keep yourself hydrated with water, diet soda, or unsweetened iced tea.  Keep in mind that regular soda, juice, and other mixers may contain a lot of sugar and must be counted as carbs. What are tips for following this plan? Reading food labels  Start by checking the serving size on the "Nutrition Facts" label of packaged foods and drinks. The amount of calories, carbs, fats, and other nutrients listed on the label is based on  one serving of the item. Many items contain more than one serving per package.  Check the total grams (g) of carbs in one serving. You can calculate the number of servings of carbs in one serving by dividing the total carbs by 15. For example, if a food has 30 g of total carbs per serving, it would be equal to 2 servings of carbs.  Check the number of grams (g) of saturated fats and trans fats in one serving. Choose foods that have a low amount or none of these fats.  Check the number of milligrams (mg) of salt (sodium) in one serving. Most people should limit total sodium intake to less than 2,300 mg per day.  Always check the nutrition information of foods labeled as "low-fat" or "nonfat." These foods may be higher in added sugar or refined carbs and should be avoided.  Talk to your dietitian to identify your daily goals for nutrients listed on the label. Shopping  Avoid buying canned, pre-made, or processed foods. These foods tend to be high in fat, sodium, and added sugar.  Shop around the outside edge of the grocery store. This is where you will most often find fresh fruits and vegetables, bulk grains, fresh meats, and fresh dairy. Cooking  Use low-heat cooking methods, such as baking, instead of high-heat cooking methods like deep frying.  Cook using healthy oils, such as olive, canola, or sunflower oil.  Avoid cooking with butter, cream, or high-fat meats. Meal planning  Eat meals and snacks regularly, preferably at the same times every day. Avoid going long periods of time without eating.  Eat foods that are high in fiber, such as fresh fruits, vegetables, beans, and whole grains. Talk with your dietitian about how many servings of carbs you can eat at each meal.  Eat 4-6 oz (112-168 g) of lean protein each day, such as lean meat, chicken, fish, eggs, or tofu. One ounce (oz) of lean protein is equal to: ? 1 oz (28 g) of meat, chicken, or fish. ? 1 egg. ?  cup (62 g) of  tofu.  Eat some foods each day that contain healthy fats, such as avocado, nuts, seeds, and fish.   What foods should I eat? Fruits Berries. Apples. Oranges. Peaches. Apricots. Plums. Grapes. Mango. Papaya. Pomegranate. Kiwi. Cherries. Vegetables Lettuce. Spinach. Leafy greens, including kale, chard, collard greens, and mustard greens. Beets. Cauliflower. Cabbage. Broccoli. Carrots. Green beans. Tomatoes. Peppers. Onions. Cucumbers. Brussels sprouts. Grains Whole grains, such as whole-wheat or whole-grain bread, crackers, tortillas, cereal, and pasta. Unsweetened oatmeal. Quinoa. Brown or wild rice. Meats and other proteins Seafood. Poultry without skin. Lean cuts of poultry and beef. Tofu. Nuts. Seeds. Dairy Low-fat or fat-free dairy products such as milk, yogurt, and cheese. The items listed above may not be a complete list of foods and beverages you can eat. Contact a dietitian for more information. What foods should I avoid? Fruits Fruits canned   with syrup. Vegetables Canned vegetables. Frozen vegetables with butter or cream sauce. Grains Refined white flour and flour products such as bread, pasta, snack foods, and cereals. Avoid all processed foods. Meats and other proteins Fatty cuts of meat. Poultry with skin. Breaded or fried meats. Processed meat. Avoid saturated fats. Dairy Full-fat yogurt, cheese, or milk. Beverages Sweetened drinks, such as soda or iced tea. The items listed above may not be a complete list of foods and beverages you should avoid. Contact a dietitian for more information. Questions to ask a health care provider  Do I need to meet with a diabetes educator?  Do I need to meet with a dietitian?  What number can I call if I have questions?  When are the best times to check my blood glucose? Where to find more information:  American Diabetes Association: diabetes.org  Academy of Nutrition and Dietetics: www.eatright.org  National Institute of  Diabetes and Digestive and Kidney Diseases: www.niddk.nih.gov  Association of Diabetes Care and Education Specialists: www.diabeteseducator.org Summary  It is important to have healthy eating habits because your blood sugar (glucose) levels are greatly affected by what you eat and drink.  A healthy meal plan will help you control your blood glucose and maintain a healthy lifestyle.  Your health care provider may recommend that you work with a dietitian to make a meal plan that is best for you.  Keep in mind that carbohydrates (carbs) and alcohol have immediate effects on your blood glucose levels. It is important to count carbs and to use alcohol carefully. This information is not intended to replace advice given to you by your health care provider. Make sure you discuss any questions you have with your health care provider. Document Revised: 02/10/2019 Document Reviewed: 02/10/2019 Elsevier Patient Education  2021 Elsevier Inc.  

## 2020-05-02 ENCOUNTER — Ambulatory Visit (INDEPENDENT_AMBULATORY_CARE_PROVIDER_SITE_OTHER): Payer: Medicaid Other

## 2020-05-02 ENCOUNTER — Other Ambulatory Visit: Payer: Self-pay

## 2020-05-02 ENCOUNTER — Ambulatory Visit: Payer: Medicaid Other | Admitting: Podiatry

## 2020-05-02 ENCOUNTER — Encounter: Payer: Self-pay | Admitting: Podiatry

## 2020-05-02 DIAGNOSIS — M792 Neuralgia and neuritis, unspecified: Secondary | ICD-10-CM | POA: Diagnosis not present

## 2020-05-02 DIAGNOSIS — M7750 Other enthesopathy of unspecified foot: Secondary | ICD-10-CM

## 2020-05-02 DIAGNOSIS — M25571 Pain in right ankle and joints of right foot: Secondary | ICD-10-CM | POA: Diagnosis not present

## 2020-05-02 DIAGNOSIS — G8929 Other chronic pain: Secondary | ICD-10-CM | POA: Diagnosis not present

## 2020-05-02 DIAGNOSIS — M7752 Other enthesopathy of left foot: Secondary | ICD-10-CM

## 2020-05-02 DIAGNOSIS — M7751 Other enthesopathy of right foot: Secondary | ICD-10-CM | POA: Diagnosis not present

## 2020-05-02 NOTE — Patient Instructions (Signed)
For instructions on how to put on your Tri-Lock Ankle Brace, please visit [email or web address] 

## 2020-05-02 NOTE — Progress Notes (Signed)
Subjective:   Patient ID: Melinda Stone, female   DOB: 40 y.o.   MRN: [id number]   HPI 40 year old female presents the office today for concerns of bilateral foot pain with right side worse than left.  She states that she is getting pain and swelling but she also gets numbness and tingling mostly to her right side.  She has diffuse tenderness along the right ankle.  She is diabetic last A1c was 6.5.  She denies any recent injury or trauma.  She said no recent treatment.  She has no other concerns today.  A1c 04/28/2020 was 6.5  Review of Systems  All other systems reviewed and are negative.  Past Medical History:  Diagnosis Date  . Diabetes mellitus without complication (HCC)   . Ovarian cyst     Past Surgical History:  Procedure Laterality Date  . CESAREAN SECTION    . CHOLECYSTECTOMY  2004  . TUBAL LIGATION  10/19/2010     Current Outpatient Medications:  .  acetaminophen (TYLENOL) 500 MG tablet, Take by mouth. (Patient not taking: Reported on 04/28/2020), Disp: , Rfl:  .  cyclobenzaprine (FLEXERIL) 10 MG tablet, , Disp: , Rfl:  .  DULoxetine (CYMBALTA) 30 MG capsule, Take 1 capsule (30 mg total) by mouth daily., Disp: 30 capsule, Rfl: 3 .  FEROSUL 325 (65 Fe) MG tablet, , Disp: , Rfl:  .  ferrous gluconate (FERGON) 324 MG tablet, Take 1 tablet (324 mg total) by mouth daily with breakfast., Disp: 90 tablet, Rfl: 1 .  fluorometholone (FML) 0.1 % ophthalmic suspension, , Disp: , Rfl:  .  furosemide (LASIX) 20 MG tablet, , Disp: , Rfl:  .  gabapentin (NEURONTIN) 600 MG tablet, Take 1 tablet (600 mg total) by mouth 2 (two) times daily AND 2 tablets (1,200 mg total) at bedtime., Disp: 360 tablet, Rfl: 1 .  glipiZIDE (GLUCOTROL) 10 MG tablet, Take 1 tablet (10 mg total) by mouth 2 (two) times daily before a meal., Disp: 180 tablet, Rfl: 3 .  glucose blood (ONETOUCH VERIO) test strip, 2x daily, Disp: 100 each, Rfl: 12 .  ibuprofen (ADVIL) 800 MG tablet, , Disp: , Rfl:  .   magnesium oxide (MAG-OX) 400 MG tablet, , Disp: , Rfl:  .  methocarbamol (ROBAXIN) 500 MG tablet, , Disp: , Rfl:  .  naproxen (NAPROSYN) 500 MG tablet, , Disp: , Rfl:  .  pantoprazole (PROTONIX) 40 MG tablet, , Disp: , Rfl:  .  pregabalin (LYRICA) 200 MG capsule, , Disp: , Rfl:  .  traMADol (ULTRAM) 50 MG tablet, Take 1 tablet (50 mg total) by mouth every 6 (six) hours as needed., Disp: 30 tablet, Rfl: 0  Current Facility-Administered Medications:  .  medroxyPROGESTERone (DEPO-PROVERA) injection 150 mg, 150 mg, Intramuscular, Once, Lezlie Lye, Irma M, MD  No Known Allergies       Objective:  Physical Exam  General: AAO x3, NAD  Dermatological: Skin is warm, dry and supple bilateral. There are no open sores, no preulcerative lesions, no rash or signs of infection present.  Vascular: Dorsalis Pedis artery and Posterior Tibial artery pedal pulses are 2/4 bilateral with immedate capillary fill time.There is no pain with calf compression, swelling, warmth, erythema.   Neruologic: Incision intact with Semmes Weinstein monofilament good but she is describing burning, tingling, numbness sensations to her feet right side worse than left.  Musculoskeletal: There is diffuse mild tenderness palpation to the right foot and ankle but not able to elicit any specific area  tenderness.  No significant edema there is no erythema or warmth.  Strength 4/5 on the right.  Gait: Unassisted, Nonantalgic.      Assessment:   Right chronic ankle pain; ? Neuropathy      Plan:  -Treatment options discussed including all alternatives, risks, and complications -Etiology of symptoms were discussed -X-rays obtained reviewed.  No evidence of acute fracture or stress fracture identified today. -Discussed the burning, numbness and tingling sensations.  Concern for possible neuropathy but she also has appointment to see orthopedics for possible back surgery.  Not sure if this is contributing as well. -Given the  chronic ankle pain on the right side dispensed a Tri-Lock ankle brace.  Also order MRI of the right ankle. -Discussed wearing better shoes with good arch supports.  Vivi Barrack DPM

## 2020-05-03 ENCOUNTER — Ambulatory Visit (INDEPENDENT_AMBULATORY_CARE_PROVIDER_SITE_OTHER): Payer: Medicaid Other | Admitting: Orthopaedic Surgery

## 2020-05-03 DIAGNOSIS — M5126 Other intervertebral disc displacement, lumbar region: Secondary | ICD-10-CM

## 2020-05-03 DIAGNOSIS — M48062 Spinal stenosis, lumbar region with neurogenic claudication: Secondary | ICD-10-CM | POA: Diagnosis not present

## 2020-05-03 NOTE — Progress Notes (Signed)
Office Visit Note   Patient: Melinda Stone           Date of Birth: [birth year]           MRN: [id number] Visit Date: 05/03/2020              Requested by: Georgina Quint, MD [address] [place],  [place] [address] PCP: Georgina Quint, MD   Assessment & Plan: Visit Diagnoses: No diagnosis found.  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  She also has significant disc protrusion at L5-S1 and plan would be a microdiscectomy on the left at L4-5 and L5-S1 with hemilaminectomy L5 for exposure removal of the free fragment.  Procedure discussed risk of rerecurrent disc herniation.  Questions elicited and answered.  Follow-Up Instructions: No follow-ups on file.   Orders:  No orders of the defined types were placed in this encounter.  No orders of the defined types were placed in this encounter.     Procedures: No procedures performed   Clinical Data: No additional findings.   Subjective: Chief Complaint  Patient presents with  . Lower Back - Pain  . Other    Surgical consult to discuss back surgery    HPI 40 year old female here for surgical consultation concerning lumbar disc herniation. She has been treated with pain medication gabapentin without relief.  Has severe pain from her back into her legs great difficulty walking.  She is using a wheelchair in public places is used a cane.  Diagnosed with diabetes age 65.  Unable to up stairs due to leg weakness.  No associated bowel bladder symptoms.  Review of Systems plus for diabetes since age 55 on oral medications.  History of GI bleed positive for sciatica.  All the systems noncontributory to HPI.   Objective: Vital Signs: LMP 04/23/2020   Physical Exam Constitutional:      Appearance: She is well-developed.  HENT:     Head: Normocephalic.     Right Ear: External ear normal.     Left Ear: External ear normal.   Eyes:     Pupils: Pupils are equal, round, and reactive to light.  Neck:     Thyroid: No thyromegaly.     Trachea: No tracheal deviation.  Cardiovascular:     Rate and Rhythm: Normal rate.  Pulmonary:     Effort: Pulmonary effort is normal.  Abdominal:     Palpations: Abdomen is soft.  Skin:    General: Skin is warm and dry.  Neurological:     Mental Status: She is alert and oriented to person, place, and time.  Psychiatric:        Mood and Affect: Mood and affect normal.        Behavior: Behavior normal.     Ortho Exam patient wheelchair difficulty getting upright position forward flexed.  Sciatic notch tenderness unable to flex beyond 40 degrees severe pain with extension.  Patient has some decreased's ankle dorsiflexion plantarflexion.  Trace lower extremity reflexes knee and ankle jerk.  Weakness in the opposite right leg not as severe as left ankle dorsiflexion plantarflexion toe flexion extension.  Pulses are 2+.  No plantar foot lesions.  Specialty Comments:  No specialty comments available.  Imaging: DG Ankle 2 Views Right  Result Date: 05/02/2020 Please see detailed radiograph report in office note.  CLINICAL DATA:  Chronic bilateral low back pain with bilateral sciatica.  EXAM: MRI LUMBAR SPINE WITHOUT CONTRAST  TECHNIQUE: Multiplanar, multisequence MR imaging of the lumbar spine was performed. No intravenous contrast was administered.  COMPARISON:  Plain films March 21, 2020.  FINDINGS: Segmentation:  Standard.  Alignment:  Physiologic.  Vertebrae:  No fracture, evidence of discitis, or bone lesion.  Conus medullaris and cauda equina: Conus extends to the L1 level. Conus and cauda equina appear normal.  Paraspinal and other soft tissues: Negative.  Disc levels:  T12-L1:No spinal canal or neural foraminal stenosis.  L1-2:No spinal canal or neural foraminal stenosis.  L2-3:No spinal canal or neural foraminal stenosis.  L3-4:No  spinal canal or neural foraminal stenosis.  L4-5:Loss of disc height, disc bulge with superimposed large left central disc extrusion migrating inferiorly, causing compression on the thecal sac with severe spinal canal stenosis and effacement of the left subarticular zone. The disc extrusion measures approximately 2.2 x 1.5 x 1.1 cm (cc, T, AP). Mild facet degenerative changes. Mild bilateral neural foraminal narrowing.  L5-S1:Left central disc protrusion causing compression on the thecal sac, resulting in moderate spinal canal stenosis with effacement of the left subarticular zone and mild narrowing of the right subarticular zone. Mild facet degenerative changes. No neural foraminal stenosis.  IMPRESSION: 1. 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 2. Left central disc protrusion at L5-S1 causing moderate spinal canal stenosis with effacement of the left subarticular zone and mild narrowing of the right subarticular zone.  These results will be called to the ordering clinician or representative by the Radiologist Assistant, and communication documented in the PACS or [employer].   Electronically Signed   By: Baldemar Lenis M.D.   On: 04/21/2020 19:33  PMFS History: Patient Active Problem List   Diagnosis Date Noted  . GI bleed 12/01/2019  . Muscle spasm 11/06/2019  . Sciatica 11/06/2019  . Type 2 diabetes mellitus with diabetic polyneuropathy, without long-term current use of insulin (HCC) 12/03/2018  . Type 2 diabetes mellitus with hyperglycemia, without long-term current use of insulin (HCC) 05/15/2018   Past Medical History:  Diagnosis Date  . Diabetes mellitus without complication (HCC)   . Ovarian cyst     Family History  Problem Relation Age of Onset  . Diabetes Mother   . Hypertension Mother   . Stroke Mother     Past Surgical History:  Procedure  Laterality Date  . CESAREAN SECTION    . CHOLECYSTECTOMY  2004  . TUBAL LIGATION  10/19/2010   Social History   Occupational History  . Not on file  Tobacco Use  . Smoking status: Current Some Day Smoker  . Smokeless tobacco: Never Used  Substance and Sexual Activity  . Alcohol use: Yes  . Drug use: Yes    Types: Marijuana  . Sexual activity: Not on file

## 2020-05-09 ENCOUNTER — Encounter: Payer: Self-pay | Admitting: Emergency Medicine

## 2020-05-09 ENCOUNTER — Other Ambulatory Visit: Payer: Self-pay

## 2020-05-09 ENCOUNTER — Ambulatory Visit: Payer: Medicaid Other | Admitting: Emergency Medicine

## 2020-05-09 VITALS — BP 110/75 | HR 88 | Temp 98.0°F | Resp 16 | Ht 69.0 in | Wt 197.0 lb

## 2020-05-09 DIAGNOSIS — Z23 Encounter for immunization: Secondary | ICD-10-CM | POA: Diagnosis not present

## 2020-05-09 DIAGNOSIS — M48062 Spinal stenosis, lumbar region with neurogenic claudication: Secondary | ICD-10-CM

## 2020-05-09 DIAGNOSIS — E1142 Type 2 diabetes mellitus with diabetic polyneuropathy: Secondary | ICD-10-CM | POA: Diagnosis not present

## 2020-05-09 DIAGNOSIS — Z01818 Encounter for other preprocedural examination: Secondary | ICD-10-CM

## 2020-05-09 DIAGNOSIS — M5442 Lumbago with sciatica, left side: Secondary | ICD-10-CM | POA: Diagnosis not present

## 2020-05-09 DIAGNOSIS — M5441 Lumbago with sciatica, right side: Secondary | ICD-10-CM

## 2020-05-09 DIAGNOSIS — M5116 Intervertebral disc disorders with radiculopathy, lumbar region: Secondary | ICD-10-CM

## 2020-05-09 DIAGNOSIS — G8929 Other chronic pain: Secondary | ICD-10-CM

## 2020-05-09 NOTE — Patient Instructions (Addendum)
If you have lab work done today you will be contacted with your lab results within the next 2 weeks.  If you have not heard from [place] then please contact us. The fastest way to get your results is to register for My Chart.   IF you received an x-ray today, you will receive an invoice from [facility]. Please contact [facility] at [phone] with questions or concerns regarding your invoice.   IF you received labwork today, you will receive an invoice from [place]. Please contact [facility] at [phone] with questions or concerns regarding your invoice.   Our billing staff will not be able to assist you with questions regarding bills from these companies.  You will be contacted with the lab results as soon as they are available. The fastest way to get your results is to activate your My Chart account. Instructions are located on the last page of this paperwork. If you have not heard from [place] regarding the results in 2 weeks, please contact this office.     Rosalie Gums and Winn neurological surgery (7th ed., pp. [phone]). [place], [place]: Elsevier.">  Spinal Stenosis  Spinal stenosis happens when the spinal canal gets smaller. The spinal canal is the space between the bones of your spine (vertebrae). As the canal gets smaller, the nerves that pass that part of the spine are pressed. This causes pain. Spinal stenosis can affect your neck, upper back, or lower back. What are the causes? This condition is caused by parts of bone that push into your spinal canal. This problem may start before birth. If it occurs after birth, the cause may be:  Breakdown of bones of your spine. This normally starts around 38 years of age.  Injury to your spine.  Tumors in your spine.  A buildup of calcium in your spine. What increases the risk? You are more likely to develop this condition if:  You are older than age 98.  You were born with a problem in your spine, such as a curved  spine (scoliosis).  You have arthritis. This is disease of your joints. What are the signs or symptoms? Common symptoms of this condition include:  Pain in your neck or back. The pain may be worse when you stand or walk.  Problems with your legs. A leg may lose feeling, tingle, or turn hot or cold.  Pain that goes from your butt down to your lower leg (sciatica).  Falling a lot.  Slapping your foot down when you walk. This weakens muscles. Severe symptoms of this condition include:  Problems pooping or peeing.  Trouble having sex.  Loss of feeling in your leg.  Being unable to walk. Sometimes there are no symptoms. How is this treated? To treat pain and manage symptoms, you may be asked to:  Practice sitting and standing up straight. This is good posture.  Exercise.  Lose weight, if needed.  Take medicines or get shots.  Support your back with a corset or a brace. In some cases, you may need to have surgery. Follow these instructions at home: Managing pain, stiffness, and swelling  Stand and sit up straight. If you have a brace or a corset, wear it as told.  Keep a healthy weight. Talk with your doctor if you need help losing weight.  If told, put heat on the affected area. Do this as often as told by your doctor. Use the heat source that your doctor recommends, such as a moist heat  pack or a heating pad. ? Place a towel between your skin and the heat source. ? Leave the heat on for 20-30 minutes. ? Take off the heat if your skin turns bright red. This is very important if you are unable to feel pain, heat, or cold. You have a greater risk of getting burned.   Activity  Do exercises as told by your doctor.  Do not do activities that cause pain. Ask your doctor what activities are safe for you.  Do not lift anything that is heavier than 10 lb (4.5 kg), or the limit that you are told by your doctor.  Return to your normal activities as told by your doctor. Ask  your doctor what activities are safe for you. General instructions  Take over-the-counter and prescription medicines only as told by your doctor.  Do not use any products that contain nicotine or tobacco, such as cigarettes, e-cigarettes, and chewing tobacco. If you need help quitting, ask your doctor.  Eat a healthy diet. Eat a lot of: ? Fruits. ? Vegetables. ? Whole grains. ? Low-fat (lean) protein.  Keep all follow-up visits as told by your doctor. This is important. Contact a doctor if:  Your symptoms do not get better.  Your symptoms get worse.  You have a fever. Get help right away if:  You have new pain or very bad pain in your neck or upper back.  You have very bad pain, and medicine does not help.  You have a very bad headache.  You are dizzy.  You do not see well.  You vomit or feel like you may vomit.  You have these things in your back or legs: ? New or worse loss of feeling. ? New or worse tingling.  Your arm or leg: ? Hurts or swells. ? Turns red. ? Feels warm. Summary  Spinal stenosis happens when the space between the bones of the spine gets smaller. The small space puts pressure on the nerves in your spine.  This condition may start before birth. Breakdown of bones, injuries, or tumors can cause this condition after birth.  Spinal stenosis can cause pain in the neck, back, legs, or butt. A leg may lose feeling, tingle, or turn hot or cold.  Treatment for this condition focuses on lessening your pain and other symptoms. This information is not intended to replace advice given to you by your health care provider. Make sure you discuss any questions you have with your health care provider. Document Revised: 01/01/2019 Document Reviewed: 01/01/2019 Elsevier Patient Education  2021 [employer].

## 2020-05-09 NOTE — Progress Notes (Signed)
Subjective:    Melinda Stone is a 40 y.o. female who presents to the office today for a preoperative consultation at the request of orthopedic surgeon who plans on performing lower back surgery on March 14. Planned anesthesia: general. The patient has the following known anesthesia issues: None. Patients bleeding risk: no recent abnormal bleeding. Patient does not have objections to receiving blood products if needed. No cardiac history.  No history of smoking.  No history of asthma or pulmonary disease. The following portions of the patient's history were reviewed and updated as appropriate: allergies, current medications, past family history, past medical history, past social history, past surgical history and problem list. Recent lumbar spine MRI as follows: IMPRESSION: 1. 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 2. Left central disc protrusion at L5-S1 causing moderate spinal canal stenosis with effacement of the left subarticular zone and mild narrowing of the right subarticular zone.  These results will be called to the ordering clinician or representative by the Radiologist Assistant, and communication documented in the PACS or [employer].   Electronically Signed   By: Baldemar Lenis M.D.   On: 04/21/2020 19:33   Review of Systems Positive for chronic low back pain Pertinent items noted in HPI and remainder of comprehensive ROS otherwise negative.    Objective:    BP 110/75   Pulse 88   Temp 98 F (36.7 C) (Temporal)   Resp 16   Ht 5\' 9"  (1.753 m)   Wt 197 lb (89.4 kg)   LMP 04/23/2020   SpO2 97%   BMI 29.09 kg/m   General Appearance:    Alert, cooperative, no distress, appears stated age  Head:    Normocephalic, without obvious abnormality, atraumatic  Eyes:    PERRL, conjunctiva/corneas clear, EOM's intact  Ears:    Normal TM's and external ear  canals, both ears  Nose:   Nares normal, septum midline, mucosa normal, no drainage    or sinus tenderness  Throat:   Lips, mucosa, and tongue normal; teeth and gums normal  Neck:   Supple, symmetrical, trachea midline, no adenopathy;    thyroid:  no enlargement/tenderness/nodules; no carotid   bruit or JVD  Back:     Symmetric, no curvature, no CVA tenderness  Lungs:     Clear to auscultation bilaterally, respirations unlabored  Chest Wall:    No tenderness or deformity   Heart:    Regular rate and rhythm, S1 and S2 normal, no murmur, rub   or gallop     Abdomen:     Soft, non-tender, bowel sounds active all four quadrants,    no masses, no organomegaly        Extremities:   Extremities normal, atraumatic, no cyanosis or edema  Pulses:   2+ and symmetric all extremities  Skin:   Skin color, texture, turgor normal, no rashes or lesions  Lymph nodes:   Cervical, supraclavicular, and axillary nodes normal  Neurologic:   CNII-XII intact, normal strength, sensation and reflexes    throughout    Predictors of intubation difficulty:  Morbid obesity? no  Anatomically abnormal facies? no  Prominent incisors? no  Receding mandible? no  Short, thick neck? no  Neck range of motion: normal     Lab Review  Office Visit on 04/28/2020  Component Date Value  . Hemoglobin A1C 04/28/2020 6.5*  . Glucose 04/28/2020 270*  . BUN 04/28/2020 4*  .  Creatinine, Ser 04/28/2020 0.54*  . GFR calc non Af Amer 04/28/2020 121   . GFR calc Af Amer 04/28/2020 139   . BUN/Creatinine Ratio 04/28/2020 7*  . Sodium 04/28/2020 136   . Potassium 04/28/2020 3.7   . Chloride 04/28/2020 101   . CO2 04/28/2020 21   . Calcium 04/28/2020 8.5*  . Total Protein 04/28/2020 6.3   . Albumin 04/28/2020 4.0   . Globulin, Total 04/28/2020 2.3   . Albumin/Globulin Ratio 04/28/2020 1.7   . Bilirubin Total 04/28/2020 1.7*  . Alkaline Phosphatase 04/28/2020 82   . AST 04/28/2020 15   . ALT 04/28/2020 12   .  Cholesterol, Total 04/28/2020 96*  . Triglycerides 04/28/2020 97   . HDL 04/28/2020 55   . VLDL Cholesterol Cal 04/28/2020 18   . LDL Chol Calc (NIH) 04/28/2020 23   . Chol/HDL Ratio 04/28/2020 1.7   . POC Glucose 04/28/2020 297*      Assessment:     [name] was seen today for pre-op exam.  Diagnoses and all orders for this visit:  Chronic bilateral low back pain with bilateral sciatica  Need for prophylactic vaccination against Streptococcus pneumoniae (pneumococcus) -     Pneumococcal polysaccharide vaccine 23-valent greater than or equal to 2yo subcutaneous/IM  Preop examination  Type 2 diabetes mellitus with diabetic polyneuropathy, without long-term current use of insulin (HCC)  Lumbar disc disease with radiculopathy  Spinal stenosis of lumbar region with neurogenic claudication     40 y.o. female with planned surgery as above.   Known risk factors for perioperative complications: Diabetes mellitus   Well-controlled diabetes with hemoglobin A1c of 6.5. Difficulty with intubation is not anticipated.  Cardiac Risk Estimation: low  Current medications which may produce withdrawal symptoms if withheld perioperatively: none   Plan:    1. Preoperative workup as: hemoglobin, hematocrit, electrolytes, creatinine, glucose, liver function studies. 2. Change in medication regimen before surgery: Discontinue glipizide day of surgery. 3. Prophylaxis for cardiac events with perioperative beta-blockers: not indicated. 4. Invasive hemodynamic monitoring perioperatively: at the discretion of anesthesiologist. 5. Deep vein thrombosis prophylaxis postoperatively:regimen to be chosen by surgical team. 6. Surveillance for postoperative MI with ECG immediately postoperatively and on postoperative days 1 and 2 AND troponin levels 24 hours postoperatively and on day 4 or hospital discharge (whichever comes first): at the discretion of anesthesiologist. 7. Other measures: None

## 2020-05-10 ENCOUNTER — Telehealth: Payer: Self-pay

## 2020-05-10 DIAGNOSIS — M5126 Other intervertebral disc displacement, lumbar region: Secondary | ICD-10-CM | POA: Insufficient documentation

## 2020-05-10 DIAGNOSIS — M48061 Spinal stenosis, lumbar region without neurogenic claudication: Secondary | ICD-10-CM | POA: Insufficient documentation

## 2020-05-10 NOTE — Telephone Encounter (Signed)
OK refill thanks 

## 2020-05-10 NOTE — Telephone Encounter (Signed)
Pt would like a refill on tramadol

## 2020-05-10 NOTE — Telephone Encounter (Signed)
Please advise. Patient scheduled for surgery 05/30/2020.

## 2020-05-11 ENCOUNTER — Telehealth: Payer: Self-pay | Admitting: Radiology

## 2020-05-11 MED ORDER — TRAMADOL HCL 50 MG PO TABS: 50.0000 mg | ORAL_TABLET | Freq: Four times a day (QID) | ORAL | 0 refills | Status: DC | PRN

## 2020-05-11 NOTE — Telephone Encounter (Signed)
Received fax from pharmacy for prior auth on Tramadol.  Entered into Cover My Meds.

## 2020-05-11 NOTE — Addendum Note (Signed)
Addended by: Rogers Seeds on: 05/11/2020 09:04 AM   Modules accepted: Orders

## 2020-05-11 NOTE — Telephone Encounter (Signed)
I called patient and advised. 

## 2020-05-11 NOTE — Telephone Encounter (Signed)
Refill called to pharmacy.

## 2020-05-15 ENCOUNTER — Ambulatory Visit
Admission: RE | Admit: 2020-05-15 | Discharge: 2020-05-15 | Disposition: A | Discharge status: Home / Self Care | Payer: Medicaid Other | Source: Ambulatory Visit | Attending: Family Medicine | Admitting: Family Medicine

## 2020-05-15 ENCOUNTER — Ambulatory Visit
Admission: RE | Admit: 2020-05-15 | Discharge: 2020-05-15 | Disposition: A | Discharge status: Home / Self Care | Payer: Medicaid Other | Source: Ambulatory Visit | Attending: Podiatry | Admitting: Podiatry

## 2020-05-15 DIAGNOSIS — M5442 Lumbago with sciatica, left side: Secondary | ICD-10-CM

## 2020-05-15 DIAGNOSIS — M7750 Other enthesopathy of unspecified foot: Secondary | ICD-10-CM

## 2020-05-15 DIAGNOSIS — G8929 Other chronic pain: Secondary | ICD-10-CM

## 2020-05-16 ENCOUNTER — Telehealth: Payer: Self-pay | Admitting: Orthopaedic Surgery

## 2020-05-16 NOTE — Telephone Encounter (Signed)
Patient called requesting for Dr. Heywood Bene to call pt pharmacy on file for pre authorization for her tramadol. Please call patient when medication has been cleared at [phone].

## 2020-05-16 NOTE — Telephone Encounter (Signed)
URGENT request faxed to [facility] for prior auth.

## 2020-05-16 NOTE — Telephone Encounter (Signed)
This was entered on Cover My Meds. I do not have approval or denial as it states 1-5 business days. I called [facility] and was told you are not able to do this on Cover My Meds. They are also unable to approve over the phone. She states that I must print out the form from [facility], complete it, and fax back in with last office note.

## 2020-05-18 ENCOUNTER — Other Ambulatory Visit: Payer: Self-pay

## 2020-05-18 NOTE — Telephone Encounter (Signed)
I called patient who is still waiting for authorization. I called [facility] number at [phone] and spoke with [place]. I explained that I have submitted through Cover My Meds and was then told by [facility] that I had to send in faxed paper request for medication. This was faxed on 05/16/2020 and we have still not received authorization or denial. [place] [facility] is unable to do prior auth over phone now. Alvino Blood was unable to help me and had to file ticket. I explained that I just needed to get patient her medication and I need to know who to speak with in order for that to happen. She has entered a ticket #[id number] with reference number [id number].  She will have someone return my call.

## 2020-05-19 ENCOUNTER — Encounter: Payer: Self-pay | Admitting: Surgery

## 2020-05-19 ENCOUNTER — Other Ambulatory Visit: Payer: Self-pay

## 2020-05-19 ENCOUNTER — Ambulatory Visit (INDEPENDENT_AMBULATORY_CARE_PROVIDER_SITE_OTHER): Payer: Medicaid Other | Admitting: Surgery

## 2020-05-19 VITALS — BP 96/65 | HR 103 | Ht 69.0 in | Wt 192.0 lb

## 2020-05-19 DIAGNOSIS — M5126 Other intervertebral disc displacement, lumbar region: Secondary | ICD-10-CM

## 2020-05-19 NOTE — Telephone Encounter (Signed)
I called pharmacy this morning. They sent medication through and it is now approved by insurance. Patient advised when she was here for appt.

## 2020-05-19 NOTE — Progress Notes (Signed)
40 year old female with history of L4-5 and L5-S1 HNP/stenosis comes in for preop evaluation.  She continues have ongoing low back pain and left greater than right lower extremity radiculopathy that has failed conservative treatment.  She is wanting to proceed with left L4-5, L5 hemilaminectomy, microdiscectomy L4-5, L5-S1 as scheduled.  Today history and physical performed.  Review of systems negative.  After reviewing patient's history he does have a history of marijuana use.  Patient does admit to this.  I will add a urine rapid drug screen to her preop labs.  Patient denied any other illicit drug use.  All questions answered.

## 2020-05-24 NOTE — Progress Notes (Signed)
Surgical Instructions    Your procedure is scheduled on Monday, May 30, 2020.  Report to [facility] Main Entrance "A" at 10:30 A.M., then check in with the Admitting office.  Call this number if you have problems the morning of surgery:  [phone]   If you have any questions prior to your surgery date call [phone]: Open Monday-Friday 8am-4pm    Remember:  Do not eat after midnight the night before your surgery  You may drink clear liquids until 09:30 the morning of your surgery.   Clear liquids allowed are: Water, Non-Citrus Juices (without pulp), Carbonated Beverages, Clear Tea, Black Coffee Only, and Gatorade   Enhanced Recovery after Surgery for Orthopedics Enhanced Recovery after Surgery is a protocol used to improve the stress on your body and your recovery after surgery.  Patient Instructions  . The night before surgery:  o No food after midnight. ONLY clear liquids after midnight   . The day of surgery (if you have diabetes):  o Drink ONE small bottle of water by 9:30 am the morning of surgery o This bottle was given to you during your hospital  pre-op appointment visit.  o Nothing else to drink after completing the  Small bottle of water.         If you have questions, please contact your surgeon's office.     Take these medicines the morning of surgery with A SIP OF WATER : Duloxetine (Cymbalta) Gabapentin (Neurontin)  If needed: Acetaminophen (Tylenol) Eye Drops  As of today, STOP taking any Aspirin (unless otherwise instructed by your surgeon) Aleve, Naproxen, Ibuprofen, Motrin, Advil, Goody's, BC's, all herbal medications, fish oil, and all vitamins.   WHAT DO I DO ABOUT MY DIABETES MEDICATION?  Marland Kitchen On Sunday, March 13, DO NOT take Evening dose of Glipizide (Glucotrol)--You may take your morning and/or lunch dose only .  Marland Kitchen On Monday, March 14--the day of your procedure, DO NOT take Glipizide (Glucotrol)   HOW TO MANAGE YOUR DIABETES BEFORE  AND AFTER SURGERY  Why is it important to control my blood sugar before and after surgery? . Improving blood sugar levels before and after surgery helps healing and can limit problems. . A way of improving blood sugar control is eating a healthy diet by: o  Eating less sugar and carbohydrates o  Increasing activity/exercise o  Talking with your doctor about reaching your blood sugar goals . High blood sugars (greater than 180 mg/dL) can raise your risk of infections and slow your recovery, so you will need to focus on controlling your diabetes during the weeks before surgery. . Make sure that the doctor who takes care of your diabetes knows about your planned surgery including the date and location.  How do I manage my blood sugar before surgery? . Check your blood sugar at least 4 times a day, starting 2 days before surgery, to make sure that the level is not too high or low. . Check your blood sugar the morning of your surgery when you wake up and every 2 hours until you get to the Short Stay unit. o If your blood sugar is less than 70 mg/dL, you will need to treat for low blood sugar: - Do not take insulin. - Treat a low blood sugar (less than 70 mg/dL) with  cup of clear juice (cranberry or apple), 4 glucose tablets, OR glucose gel. - Recheck blood sugar in 15 minutes after treatment (to make sure it is greater than 70 mg/dL). If  your blood sugar is not greater than 70 mg/dL on recheck, call [phone] for further instructions. . Report your blood sugar to the short stay nurse when you get to Short Stay.  . If you are admitted to the hospital after surgery: o Your blood sugar will be checked by the staff and you will probably be given insulin after surgery (instead of oral diabetes medicines) to make sure you have good blood sugar levels. o The goal for blood sugar control after surgery is 80-180 mg/dL.                      Do not wear jewelry, make up, or nail polish            Do  not wear lotions, powders, perfumes, or deodorant.            Do not shave 48 hours prior to surgery.             Do not bring valuables to the hospital.            [facility] is not responsible for any belongings or valuables.  Do NOT Smoke (Tobacco/Vaping) or drink Alcohol 24 hours prior to your procedure If you use a CPAP at night, you may bring all equipment for your overnight stay.   Contacts, glasses, dentures or bridgework may not be worn into surgery, please bring cases for these belongings   For patients admitted to the hospital, discharge time will be determined by your treatment team.   Patients discharged the day of surgery will not be allowed to drive home, and someone needs to stay with them for 24 hours.    Special instructions:   [place]- Preparing For Surgery  Before surgery, you can play an important role. Because skin is not sterile, your skin needs to be as free of germs as possible. You can reduce the number of germs on your skin by washing with CHG (chlorahexidine gluconate) Soap before surgery.  CHG is an antiseptic cleaner which kills germs and bonds with the skin to continue killing germs even after washing.    Oral Hygiene is also important to reduce your risk of infection.  Remember - BRUSH YOUR TEETH THE MORNING OF SURGERY WITH YOUR REGULAR TOOTHPASTE  Please do not use if you have an allergy to CHG or antibacterial soaps. If your skin becomes reddened/irritated stop using the CHG.  Do not shave (including legs and underarms) for at least 48 hours prior to first CHG shower. It is OK to shave your face.  Please follow these instructions carefully.   1. Shower the NIGHT BEFORE SURGERY and the MORNING OF SURGERY  2. If you chose to wash your hair, wash your hair first as usual with your normal shampoo.  3. After you shampoo, rinse your hair and body thoroughly to remove the shampoo.  4. Wash Face and genitals (private parts) with your normal soap.    5.  Shower the NIGHT BEFORE SURGERY and the MORNING OF SURGERY with CHG Soap.   6. Use CHG Soap as you would any other liquid soap. You can apply CHG directly to the skin and wash gently with a scrungie or a clean washcloth.   7. Apply the CHG Soap to your body ONLY FROM THE NECK DOWN.  Do not use on open wounds or open sores. Avoid contact with your eyes, ears, mouth and genitals (private parts). Wash Face and genitals (private parts)  with  your normal soap.   8. Wash thoroughly, paying special attention to the area where your surgery will be performed.  9. Thoroughly rinse your body with warm water from the neck down.  10. DO NOT shower/wash with your normal soap after using and rinsing off the CHG Soap.  11. Pat yourself dry with a CLEAN TOWEL.  12. Wear CLEAN PAJAMAS to bed the night before surgery  13. Place CLEAN SHEETS on your bed the night before your surgery  14. DO NOT SLEEP WITH PETS.   Day of Surgery: Wear Clean/Comfortable clothing the morning of surgery Do not apply any deodorants/lotions.   Remember to brush your teeth WITH YOUR REGULAR TOOTHPASTE.   Please read over the following fact sheets that you were given.

## 2020-05-25 ENCOUNTER — Encounter (HOSPITAL_COMMUNITY): Payer: Self-pay

## 2020-05-25 ENCOUNTER — Encounter (HOSPITAL_COMMUNITY)
Admission: RE | Admit: 2020-05-25 | Discharge: 2020-05-25 | Disposition: A | Discharge status: Home / Self Care | Payer: Medicaid Other | Source: Ambulatory Visit | Attending: Orthopaedic Surgery | Admitting: Orthopaedic Surgery

## 2020-05-25 ENCOUNTER — Other Ambulatory Visit: Payer: Self-pay | Admitting: Orthopaedic Surgery

## 2020-05-25 ENCOUNTER — Other Ambulatory Visit: Payer: Self-pay

## 2020-05-25 DIAGNOSIS — Z01812 Encounter for preprocedural laboratory examination: Secondary | ICD-10-CM | POA: Diagnosis not present

## 2020-05-25 HISTORY — DX: Pneumonia, unspecified organism: J18.9

## 2020-05-25 HISTORY — DX: Anemia, unspecified: D64.9

## 2020-05-25 LAB — CBC
HCT: 34 % — ABNORMAL LOW (ref 36.0–46.0)
Hemoglobin: 11.8 g/dL — ABNORMAL LOW (ref 12.0–15.0)
MCH: 28.6 pg (ref 26.0–34.0)
MCHC: 34.7 g/dL (ref 30.0–36.0)
MCV: 82.3 fL (ref 80.0–100.0)
Platelets: 234 10*3/uL (ref 150–400)
RBC: 4.13 MIL/uL (ref 3.87–5.11)
RDW: 19.2 % — ABNORMAL HIGH (ref 11.5–15.5)
WBC: 8.7 10*3/uL (ref 4.0–10.5)
nRBC: 0 % (ref 0.0–0.2)

## 2020-05-25 LAB — SURGICAL PCR SCREEN
MRSA, PCR: NEGATIVE
Staphylococcus aureus: NEGATIVE

## 2020-05-25 LAB — COMPREHENSIVE METABOLIC PANEL
ALT: 13 U/L (ref 0–44)
AST: 16 U/L (ref 15–41)
Albumin: 3.6 g/dL (ref 3.5–5.0)
Alkaline Phosphatase: 78 U/L (ref 38–126)
Anion gap: 8 (ref 5–15)
BUN: <5 mg/dL — ABNORMAL LOW (ref 6–20)
CO2: 25 mmol/L (ref 22–32)
Calcium: 8.9 mg/dL (ref 8.9–10.3)
Chloride: 103 mmol/L (ref 98–111)
Creatinine, Ser: 0.57 mg/dL (ref 0.44–1.00)
GFR, Estimated: >60 mL/min (ref >60)
Glucose, Bld: 344 mg/dL — ABNORMAL HIGH (ref 70–99)
Potassium: 4 mmol/L (ref 3.5–5.1)
Sodium: 136 mmol/L (ref 135–145)
Total Bilirubin: 1.8 mg/dL — ABNORMAL HIGH (ref 0.3–1.2)
Total Protein: 6.6 g/dL (ref 6.5–8.1)

## 2020-05-25 LAB — RAPID URINE DRUG SCREEN, HOSP PERFORMED
Amphetamines: NOT DETECTED
Barbiturates: NOT DETECTED
Benzodiazepines: NOT DETECTED
Cocaine: NOT DETECTED
Opiates: POSITIVE — AB
Tetrahydrocannabinol: POSITIVE — AB

## 2020-05-25 LAB — GLUCOSE, CAPILLARY: Glucose-Capillary: 271 mg/dL — ABNORMAL HIGH (ref 70–99)

## 2020-05-25 NOTE — Progress Notes (Signed)
Abnormal labs resulted. Dr. Ophelia Charter and Fayrene Fearing, Val [place]. PA-c notified via [employer].

## 2020-05-25 NOTE — Progress Notes (Signed)
PCP - Dr. Eilleen Kempf Sagardia Cardiologist - denies  PPM/ICD - denies  Chest x-ray - N/A EKG - 08/13/2019 - tracing requested Stress Test - denies ECHO - denies Cardiac Cath - denies  Sleep Study - denies CPAP - N/A  DM: Type 2, per patient doesn't check blood sugar at home. CBG at PAT: 271 (per patient, ate hashbrowns and drank orange juice for breakfast at about 8:30 A.M.; DM oral med not taken as yet for the morning) A1c - 6.5 on 04/28/2020  Blood Thinner Instructions: N/A Aspirin Instructions: N/A  ERAS Protcol - Yes PRE-SURGERY Ensure or G2- one 10oz bottle of water given  COVID TEST- Scheduled for 05/26/2020. Patient verbalized understanding of self-quarantine instructions, appointment time and place.  Anesthesia review: YES, EKG tracing requested HR 103 at PAT. Per patient no chest pain or tightness, pain 9/10 for lower back. No EKG needed today per Fayrene Fearing, PA-c.  Patient denies shortness of breath, fever, cough and chest pain at PAT appointment  All instructions explained to the patient, with a verbal understanding of the material. Patient agrees to go over the instructions while at home for a better understanding. Patient also instructed to self quarantine after being tested for COVID-19. The opportunity to ask questions was provided.

## 2020-05-26 ENCOUNTER — Other Ambulatory Visit (HOSPITAL_COMMUNITY)
Admission: RE | Admit: 2020-05-26 | Discharge: 2020-05-26 | Disposition: A | Discharge status: Home / Self Care | Payer: Medicaid Other | Source: Ambulatory Visit | Attending: Orthopaedic Surgery | Admitting: Orthopaedic Surgery

## 2020-05-26 DIAGNOSIS — Z01812 Encounter for preprocedural laboratory examination: Secondary | ICD-10-CM | POA: Insufficient documentation

## 2020-05-26 DIAGNOSIS — Z20822 Contact with and (suspected) exposure to covid-19: Secondary | ICD-10-CM | POA: Insufficient documentation

## 2020-05-26 LAB — SARS CORONAVIRUS 2 (TAT 6-24 HRS): SARS Coronavirus 2: NEGATIVE

## 2020-05-26 NOTE — Anesthesia Preprocedure Evaluation (Addendum)
Anesthesia Evaluation  Patient identified by MRN, date of birth, ID band Patient awake    Reviewed: Allergy & Precautions, NPO status , Patient's Chart, lab work & pertinent test results  Airway Mallampati: II  TM Distance: >3 FB Neck ROM: Full    Dental  (+) Poor Dentition, Dental Advisory Given, Missing   Pulmonary pneumonia, Current Smoker and Patient abstained from smoking.,    Pulmonary exam normal breath sounds clear to auscultation       Cardiovascular negative cardio ROS Normal cardiovascular exam Rhythm:Regular Rate:Normal     Neuro/Psych negative neurological ROS     GI/Hepatic negative GI ROS, Neg liver ROS,   Endo/Other  diabetes  Renal/GU negative Renal ROS     Musculoskeletal negative musculoskeletal ROS (+)   Abdominal   Peds  Hematology  (+) Blood dyscrasia, anemia ,   Anesthesia Other Findings   Reproductive/Obstetrics                           Anesthesia Physical Anesthesia Plan  ASA: II  Anesthesia Plan: General   Post-op Pain Management:    Induction: Intravenous  PONV Risk Score and Plan: 3 and Ondansetron, Dexamethasone, Treatment may vary due to age or medical condition, Midazolam and Diphenhydramine  Airway Management Planned: Oral ETT  Additional Equipment: None  Intra-op Plan:   Post-operative Plan: Extubation in OR  Informed Consent:   Plan Discussed with:   Anesthesia Plan Comments: (PAT note written 05/26/2020 by Shonna Chock, PA-C. )       Anesthesia Quick Evaluation

## 2020-05-26 NOTE — Progress Notes (Signed)
Anesthesia Chart Review:  Case: [id number] Date/Time: 05/30/20 1215   Procedure: left L4-5, L5 hemilaminectomy, microdiscectomy L4-5, L5-S1 (N/A )   Anesthesia type: General   Pre-op diagnosis: L4-5,L5-S1 herniated nucleus pulposus severe stenosis   [facility]: [facility] ROOM 04 / [facility]   Surgeons: [name], MD      DISCUSSION: Patient is a 40 year old female scheduled for the above procedure.  History includes smoking, DM2, anemia, ovarian cyst, cholecystectomy. + Marijuana use.   Preoperative labs revealed hyperglycemia with CBG 271 and serum glucose of 344. She had eaten hash browns and drank OJ within two hours of her labs being drawn and had not taken DM meds yet. Her A1c on 04/28/20 was 6.5%. PAT RN sent communication to surgeon regarding these results.   Presurgical COVID-19 test is scheduled for 05/26/2020.  Anesthesia team to evaluate on the day of surgery.  She is for urine pregnancy test on arrival.   VS: BP 129/84   Pulse (!) 103   Temp 36.7 C   Resp 17   Ht 5\' 9"  (1.753 m)   Wt 88.4 kg   LMP 05/22/2020 (Exact Date)   SpO2 100%   BMI 28.78 kg/m     PROVIDERS: 07/22/2020, MD is PCP    LABS: Preoperative labs noted. See DISCUSSION. Total bilirubin 1.8, consistent with 04/28/20 results. AST/ALT normal. UDS was positive for opiates and THC--patient with known marijuana use per progress note by 06/26/20, PA-C.  (all labs ordered are listed, but only abnormal results are displayed)  Labs Reviewed  GLUCOSE, CAPILLARY - Abnormal; Notable for the following components:      Result Value   Glucose-Capillary 271 (*)    All other components within normal limits  CBC - Abnormal; Notable for the following components:   Hemoglobin 11.8 (*)    HCT 34.0 (*)    RDW 19.2 (*)    All other components within normal limits  COMPREHENSIVE METABOLIC PANEL - Abnormal; Notable for the following components:   Glucose, Bld 344 (*)    BUN <5 (*)    Total Bilirubin 1.8 (*)     All other components within normal limits  RAPID URINE DRUG SCREEN, HOSP PERFORMED - Abnormal; Notable for the following components:   Opiates POSITIVE (*)    Tetrahydrocannabinol POSITIVE (*)    All other components within normal limits  SURGICAL PCR SCREEN    IMAGES: MRI L-spine 05/15/20: IMPRESSION: 1. Large left paracentral disc extrusion at L4-L5 resulting in severe canal stenosis and effacement of the left subarticular recess. 2. Moderate left paracentral disc protrusion at L5-S1 resulting in moderate canal stenosis and left subarticular recess effacement.  CTA Abdominal Aorta with illiofemoral runoff 01/05/20 ([facility]): IMPRESSION:  1. Negative abdominal aortogram with bilateral lower extremity runoff. No evidence of arterial occlusive disease.  2. Subcutaneous edema in the lower extremities, left greater than right.        CTA Chest 12/01/19: IMPRESSION:  No evidence of pulmonary embolus.  No acute bony abnormality or evidence of sternal fracture.  Question splenomegaly. Spleen not imaged in its entirety. Recommend  clinical correlation.    EKG: 08/13/19 ([facility]):Copy on chart. Normal sinus rhythm  Low voltage QRS  Borderline ECG  No previous ECGs available  Wahid, Asif ([id number]) on 08/14/2019 5:40:12 PM certifies that he/she has reviewed the ECG tracing and confirms the independent interpretation is correct.   CV: LLE venous 08/16/2019 02/25/20: Impression: Negative.  Denied prior stress  test, echocardiogram, cardiac cath.   Past Medical History:  Diagnosis Date  . Anemia   . Diabetes mellitus without complication (HCC)   . Ovarian cyst   . Pneumonia     Past Surgical History:  Procedure Laterality Date  . CESAREAN SECTION    . CHOLECYSTECTOMY  2004  . TUBAL LIGATION  10/19/2010    MEDICATIONS: . acetaminophen (TYLENOL) 500 MG tablet  . Carboxymethylcellulose Sodium (ARTIFICIAL TEARS OP)  . DULoxetine (CYMBALTA) 30 MG capsule  . FEROSUL 325 (65 Fe)  MG tablet  . ferrous gluconate (FERGON) 324 MG tablet  . gabapentin (NEURONTIN) 600 MG tablet  . glipiZIDE (GLUCOTROL) 10 MG tablet  . glucose blood (ONETOUCH VERIO) test strip  . traMADol (ULTRAM) 50 MG tablet   . medroxyPROGESTERone (DEPO-PROVERA) injection 150 mg  Not currently taking ferrous gluconate and tramadol. Last Depo-Provera listed as 01/29/19.    Shonna Chock, PA-C Surgical Short Stay/Anesthesiology [facility] Phone [phone] [facility] [place] Phone [phone] 05/26/2020 11:13 AM

## 2020-05-30 ENCOUNTER — Ambulatory Visit (HOSPITAL_COMMUNITY): Payer: Medicaid Other

## 2020-05-30 ENCOUNTER — Ambulatory Visit (HOSPITAL_COMMUNITY): Payer: Medicaid Other | Admitting: Anesthesiology

## 2020-05-30 ENCOUNTER — Other Ambulatory Visit: Payer: Self-pay

## 2020-05-30 ENCOUNTER — Observation Stay (HOSPITAL_COMMUNITY)
Admission: RE | Admit: 2020-05-30 | Discharge: 2020-05-31 | Disposition: A | Discharge status: Home / Self Care | Payer: Medicaid Other | Attending: Orthopaedic Surgery | Admitting: Orthopaedic Surgery

## 2020-05-30 ENCOUNTER — Encounter (HOSPITAL_COMMUNITY)
Admission: RE | Disposition: A | Discharge status: Home / Self Care | Payer: Self-pay | Source: Home / Self Care | Attending: Orthopaedic Surgery

## 2020-05-30 ENCOUNTER — Ambulatory Visit (HOSPITAL_COMMUNITY): Payer: Medicaid Other | Admitting: Physician Assistant

## 2020-05-30 DIAGNOSIS — M5116 Intervertebral disc disorders with radiculopathy, lumbar region: Secondary | ICD-10-CM | POA: Diagnosis not present

## 2020-05-30 DIAGNOSIS — F17200 Nicotine dependence, unspecified, uncomplicated: Secondary | ICD-10-CM | POA: Diagnosis not present

## 2020-05-30 DIAGNOSIS — M5126 Other intervertebral disc displacement, lumbar region: Secondary | ICD-10-CM

## 2020-05-30 DIAGNOSIS — M48062 Spinal stenosis, lumbar region with neurogenic claudication: Secondary | ICD-10-CM

## 2020-05-30 DIAGNOSIS — E119 Type 2 diabetes mellitus without complications: Secondary | ICD-10-CM | POA: Diagnosis not present

## 2020-05-30 DIAGNOSIS — Z7984 Long term (current) use of oral hypoglycemic drugs: Secondary | ICD-10-CM | POA: Diagnosis not present

## 2020-05-30 DIAGNOSIS — M48061 Spinal stenosis, lumbar region without neurogenic claudication: Secondary | ICD-10-CM | POA: Insufficient documentation

## 2020-05-30 DIAGNOSIS — Z419 Encounter for procedure for purposes other than remedying health state, unspecified: Secondary | ICD-10-CM

## 2020-05-30 HISTORY — PX: LUMBAR LAMINECTOMY: SHX95

## 2020-05-30 LAB — GLUCOSE, CAPILLARY
Glucose-Capillary: 269 mg/dL — ABNORMAL HIGH (ref 70–99)
Glucose-Capillary: 261 mg/dL — ABNORMAL HIGH (ref 70–99)
Glucose-Capillary: 179 mg/dL — ABNORMAL HIGH (ref 70–99)
Glucose-Capillary: 218 mg/dL — ABNORMAL HIGH (ref 70–99)
Glucose-Capillary: 285 mg/dL — ABNORMAL HIGH (ref 70–99)
Glucose-Capillary: 263 mg/dL — ABNORMAL HIGH (ref 70–99)

## 2020-05-30 LAB — POCT PREGNANCY, URINE: Preg Test, Ur: NEGATIVE

## 2020-05-30 SURGERY — MICRODISCECTOMY LUMBAR LAMINECTOMY
Anesthesia: General | Site: Spine Lumbar

## 2020-05-30 MED ORDER — LIDOCAINE 2% (20 MG/ML) 5 ML SYRINGE
INTRAMUSCULAR | Status: DC | PRN
  Administered 2020-05-30: 100 mg via INTRAVENOUS

## 2020-05-30 MED ORDER — POLYETHYLENE GLYCOL 3350 17 G PO PACK: 17.0000 g | PACK | Freq: Every day | ORAL | Status: DC | PRN

## 2020-05-30 MED ORDER — ROCURONIUM BROMIDE 10 MG/ML (PF) SYRINGE
PREFILLED_SYRINGE | INTRAVENOUS | Status: AC
  Filled 2020-05-30: qty 10

## 2020-05-30 MED ORDER — HYDROMORPHONE HCL 1 MG/ML IJ SOLN
0.2500 mg | INTRAMUSCULAR | Status: DC | PRN
  Administered 2020-05-30: 0.25 mg via INTRAVENOUS

## 2020-05-30 MED ORDER — SUGAMMADEX SODIUM 200 MG/2ML IV SOLN
INTRAVENOUS | Status: DC | PRN
  Administered 2020-05-30: 200 mg via INTRAVENOUS

## 2020-05-30 MED ORDER — SODIUM CHLORIDE 0.9 % IV SOLN: 250.0000 mL | INTRAVENOUS | Status: DC

## 2020-05-30 MED ORDER — FERROUS SULFATE 325 (65 FE) MG PO TABS
325.0000 mg | ORAL_TABLET | Freq: Every day | ORAL | Status: DC
  Administered 2020-05-31: 325 mg via ORAL
  Filled 2020-05-30: qty 1

## 2020-05-30 MED ORDER — MIDAZOLAM HCL 2 MG/2ML IJ SOLN
INTRAMUSCULAR | Status: DC | PRN
  Administered 2020-05-30: 2 mg via INTRAVENOUS

## 2020-05-30 MED ORDER — CHLORHEXIDINE GLUCONATE 0.12 % MT SOLN
15.0000 mL | Freq: Once | OROMUCOSAL | Status: AC
  Administered 2020-05-30: 15 mL via OROMUCOSAL
  Filled 2020-05-30: qty 15

## 2020-05-30 MED ORDER — SODIUM CHLORIDE 0.9% FLUSH: 3.0000 mL | Freq: Two times a day (BID) | INTRAVENOUS | Status: DC

## 2020-05-30 MED ORDER — BUPIVACAINE HCL (PF) 0.25 % IJ SOLN
INTRAMUSCULAR | Status: DC | PRN
  Administered 2020-05-30: 10 mL

## 2020-05-30 MED ORDER — OXYCODONE HCL 5 MG PO TABS: 5.0000 mg | ORAL_TABLET | ORAL | Status: DC | PRN

## 2020-05-30 MED ORDER — MIDAZOLAM HCL 2 MG/2ML IJ SOLN
INTRAMUSCULAR | Status: AC
  Filled 2020-05-30: qty 2

## 2020-05-30 MED ORDER — DEXAMETHASONE SODIUM PHOSPHATE 10 MG/ML IJ SOLN
INTRAMUSCULAR | Status: DC | PRN
  Administered 2020-05-30: 10 mg via INTRAVENOUS

## 2020-05-30 MED ORDER — CEFAZOLIN SODIUM-DEXTROSE 2-4 GM/100ML-% IV SOLN
2.0000 g | INTRAVENOUS | Status: AC
  Administered 2020-05-30: 2 g via INTRAVENOUS
  Filled 2020-05-30: qty 100

## 2020-05-30 MED ORDER — PROPOFOL 10 MG/ML IV BOLUS
INTRAVENOUS | Status: DC | PRN
  Administered 2020-05-30: 200 mg via INTRAVENOUS

## 2020-05-30 MED ORDER — INSULIN ASPART 100 UNIT/ML ~~LOC~~ SOLN: 5.0000 [IU] | Freq: Once | SUBCUTANEOUS | Status: AC

## 2020-05-30 MED ORDER — INSULIN ASPART 100 UNIT/ML ~~LOC~~ SOLN
0.0000 [IU] | Freq: Every day | SUBCUTANEOUS | Status: DC
  Administered 2020-05-30: 3 [IU] via SUBCUTANEOUS

## 2020-05-30 MED ORDER — INSULIN ASPART 100 UNIT/ML ~~LOC~~ SOLN: 0.0000 [IU] | Freq: Three times a day (TID) | SUBCUTANEOUS | Status: DC

## 2020-05-30 MED ORDER — ACETAMINOPHEN 650 MG RE SUPP: 650.0000 mg | RECTAL | Status: DC | PRN

## 2020-05-30 MED ORDER — OXYCODONE-ACETAMINOPHEN 5-325 MG PO TABS
1.0000 | ORAL_TABLET | ORAL | Status: DC | PRN
  Administered 2020-05-30 – 2020-05-31 (×4): 2 via ORAL
  Filled 2020-05-30 (×4): qty 2

## 2020-05-30 MED ORDER — OXYCODONE-ACETAMINOPHEN 5-325 MG PO TABS: 1.0000 | ORAL_TABLET | Freq: Four times a day (QID) | ORAL | 0 refills | Status: DC | PRN

## 2020-05-30 MED ORDER — METHOCARBAMOL 1000 MG/10ML IJ SOLN
500.0000 mg | Freq: Four times a day (QID) | INTRAVENOUS | Status: DC | PRN
  Filled 2020-05-30: qty 5

## 2020-05-30 MED ORDER — LIDOCAINE 2% (20 MG/ML) 5 ML SYRINGE
INTRAMUSCULAR | Status: AC
  Filled 2020-05-30: qty 5

## 2020-05-30 MED ORDER — AMISULPRIDE (ANTIEMETIC) 5 MG/2ML IV SOLN
10.0000 mg | Freq: Once | INTRAVENOUS | Status: AC | PRN
  Administered 2020-05-30: 10 mg via INTRAVENOUS

## 2020-05-30 MED ORDER — ONDANSETRON HCL 4 MG PO TABS: 4.0000 mg | ORAL_TABLET | Freq: Four times a day (QID) | ORAL | Status: DC | PRN

## 2020-05-30 MED ORDER — PHENYLEPHRINE 40 MCG/ML (10ML) SYRINGE FOR IV PUSH (FOR BLOOD PRESSURE SUPPORT)
PREFILLED_SYRINGE | INTRAVENOUS | Status: DC | PRN
  Administered 2020-05-30 (×8): 40 ug via INTRAVENOUS

## 2020-05-30 MED ORDER — PHENOL 1.4 % MT LIQD: 1.0000 | OROMUCOSAL | Status: DC | PRN

## 2020-05-30 MED ORDER — AMISULPRIDE (ANTIEMETIC) 5 MG/2ML IV SOLN
INTRAVENOUS | Status: AC
  Filled 2020-05-30: qty 4

## 2020-05-30 MED ORDER — PROPOFOL 10 MG/ML IV BOLUS
INTRAVENOUS | Status: AC
  Filled 2020-05-30: qty 20

## 2020-05-30 MED ORDER — PHENYLEPHRINE HCL-NACL 10-0.9 MG/250ML-% IV SOLN
INTRAVENOUS | Status: DC | PRN
  Administered 2020-05-30: 20 ug/min via INTRAVENOUS

## 2020-05-30 MED ORDER — ORAL CARE MOUTH RINSE: 15.0000 mL | Freq: Once | OROMUCOSAL | Status: AC

## 2020-05-30 MED ORDER — ACETAMINOPHEN 325 MG PO TABS: 650.0000 mg | ORAL_TABLET | ORAL | Status: DC | PRN

## 2020-05-30 MED ORDER — SODIUM CHLORIDE 0.9% FLUSH
3.0000 mL | INTRAVENOUS | Status: DC | PRN
Start: 2020-05-30 — End: 2020-05-31

## 2020-05-30 MED ORDER — ONDANSETRON HCL 4 MG/2ML IJ SOLN
INTRAMUSCULAR | Status: AC
  Filled 2020-05-30: qty 2

## 2020-05-30 MED ORDER — LACTATED RINGERS IV SOLN: INTRAVENOUS | Status: DC

## 2020-05-30 MED ORDER — DULOXETINE HCL 30 MG PO CPEP
30.0000 mg | ORAL_CAPSULE | Freq: Every day | ORAL | Status: DC
Start: 2020-05-30 — End: 2020-05-31
  Administered 2020-05-30: 30 mg via ORAL
  Filled 2020-05-30: qty 1

## 2020-05-30 MED ORDER — MENTHOL 3 MG MT LOZG: 1.0000 | LOZENGE | OROMUCOSAL | Status: DC | PRN

## 2020-05-30 MED ORDER — FENTANYL CITRATE (PF) 250 MCG/5ML IJ SOLN
INTRAMUSCULAR | Status: DC | PRN
  Administered 2020-05-30 (×2): 50 ug via INTRAVENOUS
  Administered 2020-05-30: 100 ug via INTRAVENOUS
  Administered 2020-05-30: 50 ug via INTRAVENOUS

## 2020-05-30 MED ORDER — PHENYLEPHRINE 40 MCG/ML (10ML) SYRINGE FOR IV PUSH (FOR BLOOD PRESSURE SUPPORT)
PREFILLED_SYRINGE | INTRAVENOUS | Status: AC
  Filled 2020-05-30: qty 10

## 2020-05-30 MED ORDER — HYDROMORPHONE HCL 1 MG/ML IJ SOLN
INTRAMUSCULAR | Status: AC
  Filled 2020-05-30: qty 1

## 2020-05-30 MED ORDER — SODIUM CHLORIDE 0.9 % IV SOLN: INTRAVENOUS | Status: DC

## 2020-05-30 MED ORDER — SODIUM CHLORIDE 0.9 % IR SOLN
Status: DC | PRN
  Administered 2020-05-30: 1000 mL

## 2020-05-30 MED ORDER — DOCUSATE SODIUM 100 MG PO CAPS
100.0000 mg | ORAL_CAPSULE | Freq: Two times a day (BID) | ORAL | Status: DC
  Administered 2020-05-30: 100 mg via ORAL
  Filled 2020-05-30: qty 1

## 2020-05-30 MED ORDER — GLIPIZIDE 5 MG PO TABS
10.0000 mg | ORAL_TABLET | Freq: Two times a day (BID) | ORAL | Status: DC
  Administered 2020-05-30 – 2020-05-31 (×2): 10 mg via ORAL
  Filled 2020-05-30 (×2): qty 2

## 2020-05-30 MED ORDER — INSULIN ASPART 100 UNIT/ML ~~LOC~~ SOLN
0.0000 [IU] | Freq: Three times a day (TID) | SUBCUTANEOUS | Status: DC
  Administered 2020-05-30: 8 [IU] via SUBCUTANEOUS

## 2020-05-30 MED ORDER — METHOCARBAMOL 500 MG PO TABS: 500.0000 mg | ORAL_TABLET | Freq: Four times a day (QID) | ORAL | 0 refills | Status: DC | PRN

## 2020-05-30 MED ORDER — BUPIVACAINE HCL (PF) 0.25 % IJ SOLN
INTRAMUSCULAR | Status: AC
  Filled 2020-05-30: qty 30

## 2020-05-30 MED ORDER — FENTANYL CITRATE (PF) 250 MCG/5ML IJ SOLN
INTRAMUSCULAR | Status: AC
  Filled 2020-05-30: qty 5

## 2020-05-30 MED ORDER — ONDANSETRON HCL 4 MG/2ML IJ SOLN
INTRAMUSCULAR | Status: DC | PRN
  Administered 2020-05-30: 4 mg via INTRAVENOUS

## 2020-05-30 MED ORDER — MEPERIDINE HCL 25 MG/ML IJ SOLN: 6.2500 mg | INTRAMUSCULAR | Status: DC | PRN

## 2020-05-30 MED ORDER — HYDROMORPHONE HCL 1 MG/ML IJ SOLN: 0.5000 mg | INTRAMUSCULAR | Status: DC | PRN

## 2020-05-30 MED ORDER — METHOCARBAMOL 500 MG PO TABS
500.0000 mg | ORAL_TABLET | Freq: Four times a day (QID) | ORAL | Status: DC | PRN
  Administered 2020-05-30 – 2020-05-31 (×2): 500 mg via ORAL
  Filled 2020-05-30 (×2): qty 1

## 2020-05-30 MED ORDER — GABAPENTIN 600 MG PO TABS
600.0000 mg | ORAL_TABLET | Freq: Two times a day (BID) | ORAL | Status: DC
  Administered 2020-05-30: 600 mg via ORAL
  Filled 2020-05-30: qty 1

## 2020-05-30 MED ORDER — INSULIN ASPART 100 UNIT/ML ~~LOC~~ SOLN
SUBCUTANEOUS | Status: AC
  Administered 2020-05-30: 5 [IU] via SUBCUTANEOUS
  Filled 2020-05-30: qty 1

## 2020-05-30 MED ORDER — HEMOSTATIC AGENTS (NO CHARGE) OPTIME
TOPICAL | Status: DC | PRN
  Administered 2020-05-30: 1 via TOPICAL

## 2020-05-30 MED ORDER — ROCURONIUM BROMIDE 10 MG/ML (PF) SYRINGE
PREFILLED_SYRINGE | INTRAVENOUS | Status: DC | PRN
  Administered 2020-05-30: 50 mg via INTRAVENOUS
  Administered 2020-05-30 (×2): 10 mg via INTRAVENOUS

## 2020-05-30 MED ORDER — DEXAMETHASONE SODIUM PHOSPHATE 10 MG/ML IJ SOLN
INTRAMUSCULAR | Status: AC
  Filled 2020-05-30: qty 1

## 2020-05-30 MED ORDER — BUPIVACAINE LIPOSOME 1.3 % IJ SUSP
20.0000 mL | Freq: Once | INTRAMUSCULAR | Status: DC
  Filled 2020-05-30: qty 20

## 2020-05-30 MED ORDER — ONDANSETRON HCL 4 MG/2ML IJ SOLN: 4.0000 mg | Freq: Four times a day (QID) | INTRAMUSCULAR | Status: DC | PRN

## 2020-05-30 SURGICAL SUPPLY — 51 items
BLADE SURG 15 STRL LF DISP TIS (BLADE) ×1 IMPLANT
BLADE SURG 15 STRL SS (BLADE) ×1
BUR ROUND FLUTED 4 SOFT TCH (BURR) IMPLANT
CANISTER SUCT 3000ML PPV (MISCELLANEOUS) ×2 IMPLANT
COVER SURGICAL LIGHT HANDLE (MISCELLANEOUS) ×2 IMPLANT
COVER WAND RF STERILE (DRAPES) ×2 IMPLANT
DERMABOND ADVANCED (GAUZE/BANDAGES/DRESSINGS)
DERMABOND ADVANCED .7 DNX12 (GAUZE/BANDAGES/DRESSINGS) IMPLANT
DRAPE HALF SHEET 40X57 (DRAPES) ×4 IMPLANT
DRAPE MICROSCOPE LEICA (MISCELLANEOUS) ×2 IMPLANT
DRSG MEPILEX BORDER 4X4 (GAUZE/BANDAGES/DRESSINGS) IMPLANT
DRSG MEPILEX BORDER 4X8 (GAUZE/BANDAGES/DRESSINGS) IMPLANT
DRSG XEROFORM 1X8 (GAUZE/BANDAGES/DRESSINGS) ×2 IMPLANT
DURAPREP 26ML APPLICATOR (WOUND CARE) ×2 IMPLANT
DURASEAL SPINE SEALANT 3ML (MISCELLANEOUS) IMPLANT
ELECT BLADE 4.0 EZ CLEAN MEGAD (MISCELLANEOUS) ×2
ELECT REM PT RETURN 9FT ADLT (ELECTROSURGICAL) ×2
ELECTRODE BLDE 4.0 EZ CLN MEGD (MISCELLANEOUS) ×1 IMPLANT
ELECTRODE REM PT RTRN 9FT ADLT (ELECTROSURGICAL) ×1 IMPLANT
GAUZE SPONGE 4X4 12PLY STRL (GAUZE/BANDAGES/DRESSINGS) ×2 IMPLANT
GLOVE ORTHO TXT STRL SZ7.5 (GLOVE) ×4 IMPLANT
GLOVE SRG 8 PF TXTR STRL LF DI (GLOVE) ×2 IMPLANT
GLOVE SURG UNDER POLY LF SZ8 (GLOVE) ×2
GOWN STRL REUS W/ TWL LRG LVL3 (GOWN DISPOSABLE) ×1 IMPLANT
GOWN STRL REUS W/ TWL XL LVL3 (GOWN DISPOSABLE) ×1 IMPLANT
GOWN STRL REUS W/TWL 2XL LVL3 (GOWN DISPOSABLE) ×2 IMPLANT
GOWN STRL REUS W/TWL LRG LVL3 (GOWN DISPOSABLE) ×1
GOWN STRL REUS W/TWL XL LVL3 (GOWN DISPOSABLE) ×1
KIT BASIN OR (CUSTOM PROCEDURE TRAY) ×2 IMPLANT
KIT TURNOVER KIT B (KITS) ×2 IMPLANT
NEEDLE SPNL 18GX3.5 QUINCKE PK (NEEDLE) ×2 IMPLANT
NS IRRIG 1000ML POUR BTL (IV SOLUTION) ×2 IMPLANT
PACK LAMINECTOMY ORTHO (CUSTOM PROCEDURE TRAY) ×2 IMPLANT
PAD ARMBOARD 7.5X6 YLW CONV (MISCELLANEOUS) ×4 IMPLANT
PATTIES SURGICAL .5 X.5 (GAUZE/BANDAGES/DRESSINGS) ×2 IMPLANT
PATTIES SURGICAL .75X.75 (GAUZE/BANDAGES/DRESSINGS) IMPLANT
SPONGE LAP 4X18 RFD (DISPOSABLE) ×2 IMPLANT
STAPLER VISISTAT 35W (STAPLE) ×2 IMPLANT
SURGIFLO W/THROMBIN 8M KIT (HEMOSTASIS) ×2 IMPLANT
SUT VIC AB 0 CT1 27 (SUTURE) ×1
SUT VIC AB 0 CT1 27XBRD ANBCTR (SUTURE) ×1 IMPLANT
SUT VIC AB 1 CTX 36 (SUTURE) ×2
SUT VIC AB 1 CTX36XBRD ANBCTR (SUTURE) ×2 IMPLANT
SUT VIC AB 2-0 CT1 27 (SUTURE) ×1
SUT VIC AB 2-0 CT1 TAPERPNT 27 (SUTURE) ×1 IMPLANT
SUT VIC AB 3-0 X1 27 (SUTURE) IMPLANT
SYR 20ML ECCENTRIC (SYRINGE) IMPLANT
SYR CONTROL 10ML LL (SYRINGE) ×2 IMPLANT
TOWEL GREEN STERILE (TOWEL DISPOSABLE) ×2 IMPLANT
TOWEL GREEN STERILE FF (TOWEL DISPOSABLE) ×2 IMPLANT
WATER STERILE IRR 1000ML POUR (IV SOLUTION) IMPLANT

## 2020-05-30 NOTE — Op Note (Signed)
Preop diagnosis: Large L4-5 extruded disc with severe canal stenosis, large L5-S1 left paracentral disc protrusion with moderate central stenosis.  Postop diagnosis: Same  Procedure: L4 laminectomy including bilateral partial facetectomy, left L5 hemilaminectomy.  Bilateral microdiscectomy at L4-5, left L5-S1 microdiscectomy.  Surgeon: Annell Greening, MD  Assistant: Zonia Kief, PA-C medically necessary and present with entire procedure  Anesthesia: General orotracheal plus Marcaine local.  EBL: None approximately 200 cc.  Findings  Massive disc herniation L4-5 caudally extruded almost down to the level of the L5-S1 disc.  Large L5-S1 paracentral disc protrusion with moderate central stenosis more chronic.  Procedure: After induction of general C shoulder recommendation patient was placed prone on chest rolls.  Careful padding positioning calf pumpers yellow foam foam crates underneath the ulnar nerve forearms and in front of the shoulders.  Back was prepped with DuraPrep there is squared with towels Betadine Steri-Drape after skin marker and laminectomy sheet and drapes.  Spinal needle placed x-ray taken adjusted caudally slightly repeat x-ray taken skin was marked with a taut needle just above the L4-5 disc base at the level of the pedicle and the inferior needle just below L5-S1.  Timeout procedure was completed Ancef 2 g was given prophylactically.  Midline incision was made subperiosteal dissection onto the lamina.  McCullough retractor was used with double blades deepest available.  Spinous process of L4 was taken after repeat x-ray with Coker clamps were placed confirming the bottom Coker just below the L5-S1 disc base and the other above the L4-5 disc space.  Spinous processes of L4 was taken lamina was thinned initially with a Leksell rongeur and then the 4 mm bur.  Operative microscope was brought in and thick chunks of ligament were removed primarily on the right side first.  Patient had  the spinous process narrow distance between the pedicles and hypertrophic facets partial facet removal was necessary left and right side.  Gutter had prominent veins and disc protrusion was large annulus was incised at L4-5 using the microscope and then multiple large massive chunks of disc were removed progressing caudally cleaning out the pieces.  Bone was removed and thick chunks of ligament on the opposite side we switched sides microdiscectomy was done on the right side however by then there was minimal disc left.  Hockey-stick could be passed across visualized on the other side of the dura and the pouch where the extruded fragment was present was completely cleaned out.  We moved the [facility] retractor caudally and hemilaminectomy was performed on the left side taking out L5 and the top portion of S1.  Thick chunks of ligament was removed and partial facet ectomy.  Microdiscectomy was performed this appeared more chronic and took some time with Gwyneth Sprout is a push fragments of the left paracentral disc protrusion which was firm down into the disc space and remove it to decompress.  Hockey-stick can be passed into the dura and the large bump that was causing moderate stenosis was decompressed.  There was not compression on the opposite right side.  Surgi-Flo and patties have been used in the gutters as well as bipolar cautery and we repeated this process.  Epidural space was dry at the time of closure.  Final passes were made anterior to the dura right and left side at L4-5 and anterior to the body of L5 no remaining fragments were present.  Patient's plain radiograph showed near complete collapse of the L4-5 disc space and it.  The extruded fragment was was a significant  portion of the patient's nucleus.  Repeat irrigation closure with #1 Vicryl 2-0 Vicryl subtendinous tissue Marcaine infiltration of the skin and skin closure staples postop dressing and transferred to cover room.  Neuro exam is pending patient  being more alert.

## 2020-05-30 NOTE — Interval H&P Note (Signed)
History and Physical Interval Note:  05/30/2020 12:13 PM  Melinda Stone  has presented today for surgery, with the diagnosis of L4-5,L5-S1 herniated nucleus pulposus severe stenosis.  The various methods of treatment have been discussed with the patient and family. After consideration of risks, benefits and other options for treatment, the patient has consented to  Procedure(s): left L4-5, L5 hemilaminectomy, microdiscectomy L4-5, L5-S1 (N/A) as a surgical intervention.  The patient's history has been reviewed, patient examined, no change in status, stable for surgery.  I have reviewed the patient's chart and labs.  Questions were answered to the patient's satisfaction.     [name]  Discussed with pt likely bilateral microdiscectomy needed at L4-5 due to large fragment and also right leg symptoms now. Discussed, she agrees to proceed.

## 2020-05-30 NOTE — H&P (Signed)
Melinda Stone is an 40 y.o. female.   Chief Complaint: back pain and LE radiculopathy HPI: 40 year old female with history of L4-5 and L5-S1 HNP/stenosis comes in for preop evaluation.  She continues have ongoing low back pain and left greater than right lower extremity radiculopathy that has failed conservative treatment.  She is wanting to proceed with left L4-5, L5 hemilaminectomy, microdiscectomy L4-5, L5-S1 as scheduled.  Today history and physical performed.  Review of systems negative.  After reviewing patient's history he does have a history of marijuana use.  Patient does admit to this.  I will add a urine rapid drug screen to her preop labs.  Patient denied any other illicit drug use  Past Medical History:  Diagnosis Date  . Anemia   . Diabetes mellitus without complication (HCC)   . Ovarian cyst   . Pneumonia     Past Surgical History:  Procedure Laterality Date  . CESAREAN SECTION    . CHOLECYSTECTOMY  2004  . TUBAL LIGATION  10/19/2010    Family History  Problem Relation Age of Onset  . Diabetes Mother   . Hypertension Mother   . Stroke Mother    Social History:  reports that she has been smoking. She has never used smokeless tobacco. She reports current alcohol use. She reports current drug use. Drug: Marijuana.  Allergies: No Known Allergies  No medications prior to admission.    No results found for this or any previous visit (from the past 48 hour(s)). No results found.  Review of Systems  Constitutional: Positive for activity change.  HENT: Negative.   Respiratory: Negative.   Gastrointestinal: Negative.   Genitourinary: Negative.   Musculoskeletal: Positive for back pain.  Neurological: Positive for numbness.  Psychiatric/Behavioral: Negative.     Last menstrual period 05/22/2020. Physical Exam Constitutional:      General: She is not in acute distress. HENT:     Head: Normocephalic.     Nose: Nose normal.  Eyes:     Extraocular  Movements: Extraocular movements intact.  Cardiovascular:     Rate and Rhythm: Regular rhythm.  Pulmonary:     Effort: No respiratory distress.     Breath sounds: Normal breath sounds.  Abdominal:     General: Bowel sounds are normal. There is no distension.  Musculoskeletal:        General: Tenderness present.  Neurological:     Mental Status: She is oriented to person, place, and time.      Assessment/Plan L4-5 and L5-S1 HNP/stenosis  We will proceed with surgery as scheduled.  All questions answered.    Zonia Kief, PA-C 05/30/2020, 7:16 AM

## 2020-05-30 NOTE — Discharge Instructions (Addendum)
Ok to shower 1 days postop.  Do not apply any creams or ointments to incision.  Do not remove steri-strips.  Can use 4x4 gauze and tape for dressing changes if your dressing comes off. You can leave dressing on to shower it is water proof.    No aggressive activity.  .  No bending, twisting, squatting or prolonged sitting.  Mostly be in reclined position or lying down.    No driving until further notice.

## 2020-05-30 NOTE — Transfer of Care (Signed)
Immediate Anesthesia Transfer of Care Note  Patient: Melinda Stone  Procedure(s) Performed: Lumbar five Laminectomy,  Bilateral Microdiscectomy, Left Lumbar five -Sacral one Microdiscectomy (N/A Spine Lumbar)  Patient Location: PACU  Anesthesia Type:General  Level of Consciousness: awake, alert  and oriented  Airway & Oxygen Therapy: Patient Spontanous Breathing  Post-op Assessment: Report given to RN and Post -op Vital signs reviewed and stable  Post vital signs: Reviewed and stable  Last Vitals:  Vitals Value Taken Time  BP 120/77 05/30/20 1538  Temp    Pulse 96 05/30/20 1539  Resp 16 05/30/20 1539  SpO2 100 % 05/30/20 1539  Vitals shown include unvalidated device data.  Last Pain:  Vitals:   05/30/20 1048  TempSrc:   PainSc: 8          Complications: No complications documented.

## 2020-05-30 NOTE — Anesthesia Procedure Notes (Signed)
Procedure Name: Intubation Date/Time: 05/30/2020 12:49 PM Performed by: Candis Shine, CRNA Pre-anesthesia Checklist: Patient identified, Emergency Drugs available, Suction available and Patient being monitored Patient Re-evaluated:Patient Re-evaluated prior to induction Oxygen Delivery Method: Circle System Utilized Preoxygenation: Pre-oxygenation with 100% oxygen Induction Type: IV induction Ventilation: Mask ventilation without difficulty Laryngoscope Size: Mac and 3 Grade View: Grade I Tube type: Oral Tube size: 7.0 mm Number of attempts: 1 Airway Equipment and Method: Stylet Placement Confirmation: ETT inserted through vocal cords under direct vision,  positive ETCO2 and breath sounds checked- equal and bilateral Secured at: 22 cm Tube secured with: Tape Dental Injury: Teeth and Oropharynx as per pre-operative assessment

## 2020-05-31 ENCOUNTER — Encounter (HOSPITAL_COMMUNITY): Payer: Self-pay | Admitting: Orthopaedic Surgery

## 2020-05-31 DIAGNOSIS — M5116 Intervertebral disc disorders with radiculopathy, lumbar region: Secondary | ICD-10-CM | POA: Diagnosis not present

## 2020-05-31 LAB — GLUCOSE, CAPILLARY: Glucose-Capillary: 114 mg/dL — ABNORMAL HIGH (ref 70–99)

## 2020-05-31 NOTE — Evaluation (Signed)
Physical Therapy Evaluation Patient Details Name: Melinda Stone MRN: [id number] DOB: January 28, 1981 Today's Date: 05/31/2020   History of Present Illness  Patient is a 40 y/o female who presents s/p L4 laminectomy including bilateral partial facetectomy, left L5 hemilaminectomy, L4-5 bilateral microdiscectomy, left L5-S1 microdiscectomy 3/14. PMH includes DM and PNA.  Clinical Impression  Patient presents with pain, nausea and post surgical deficits s/p above surgery. Pt reports living with 3 children in an apt and being independent for ADLs PTA with some difficulty due to pain. Today, pt tolerated bed mobility, transfers, gait training and stair training with supervision-Min A for safety. Guarded and painful gait limiting distance and interrupted by nausea and emesis. Tolerated stair training Min A for balance/safety. Education re: back precautions, hand out, log roll technique, positioning, stair training with help of daughter and activity recommendations. Will follow acutely to maximize independence and mobility prior to return home.    Follow Up Recommendations No PT follow up;Supervision - Intermittent    Equipment Recommendations  None recommended by PT    Recommendations for Other Services       Precautions / Restrictions Precautions Precautions: Back Precaution Booklet Issued: Yes (comment) Precaution Comments: Reviewed back precautions and handout Restrictions Weight Bearing Restrictions: No      Mobility  Bed Mobility Overal bed mobility: Needs Assistance Bed Mobility: Rolling;Sidelying to Sit Rolling: Modified independent (Device/Increase time) Sidelying to sit: Modified independent (Device/Increase time) Supine to sit: Supervision;HOB elevated     General bed mobility comments: HOB flat, no use of rails to simulate home, cues for log roll technique. Slow.    Transfers Overall transfer level: Needs assistance Equipment used: None Transfers: Sit to/from  Stand Sit to Stand: Supervision         General transfer comment: Supervision for safety. Pt walking up LEs to rise. Stood from [employer], from toilet x1.  Ambulation/Gait Ambulation/Gait assistance: Min guard Gait Distance (Feet): 125 Feet Assistive device: None Gait Pattern/deviations: Step-through pattern;Decreased stride length Gait velocity: decreased   General Gait Details: Slow, guarded and mildly unsteady gait with pt reaching for rail for support at times; pain limiting. + nausea.  Stairs Stairs: Yes Stairs assistance: Min assist Stair Management: One rail Right;Step to pattern Number of Stairs: 4 General stair comments: Cues for technique and safety; HHA and rail to ascend/descend. SLow, painful.  Wheelchair Mobility    Modified Rankin (Stroke Patients Only)       Balance Overall balance assessment: Needs assistance Sitting-balance support: Feet supported;No upper extremity supported Sitting balance-Leahy Scale: Good     Standing balance support: During functional activity Standing balance-Leahy Scale: Good Standing balance comment: Able to stand at sink and wash hands with no UE support                             Pertinent Vitals/Pain Pain Assessment: 0-10 Pain Score: 9  Pain Location: back, LLE Pain Descriptors / Indicators: Cramping;Sore Pain Intervention(s): Monitored during session;Repositioned;Limited activity within patient's tolerance    Home Living Family/patient expects to be discharged to:: Private residence Living Arrangements: Children (70, 28, 13 year old) Available Help at Discharge: Family Type of Home: Apartment Home Access: Stairs to enter Entrance Stairs-Rails: Right Entrance Stairs-Number of Steps: 10 Home Layout: One level Home Equipment: None      Prior Function Level of Independence: Independent         Comments: Reports she was independent for ADLs/walking but moving slowly and  in pain. Children help take  care of her. Does not work, likes to read.     Hand Dominance        Extremity/Trunk Assessment   Upper Extremity Assessment Upper Extremity Assessment: Defer to OT evaluation    Lower Extremity Assessment Lower Extremity Assessment: LLE deficits/detail;Generalized weakness LLE Deficits / Details: Cramping, functional strength noted, decreased sensation/tingling LLE    Cervical / Trunk Assessment Cervical / Trunk Assessment: Other exceptions Cervical / Trunk Exceptions: s/p back surgery  Communication   Communication: No difficulties  Cognition Arousal/Alertness: Awake/alert Behavior During Therapy: WFL for tasks assessed/performed Overall Cognitive Status: Within Functional Limits for tasks assessed                                 General Comments:      General Comments General comments (skin integrity, edema, etc.): Bandage- clean, dry and intact.    Exercises     Assessment/Plan    PT Assessment Patient needs continued PT services  PT Problem List Decreased mobility;Decreased knowledge of precautions;Pain;Impaired sensation;Decreased skin integrity;Decreased activity tolerance;Decreased balance       PT Treatment Interventions Therapeutic exercise;Gait training;Balance training;Functional mobility training;Therapeutic activities;Patient/family education;Stair training    PT Goals (Current goals can be found in the Care Plan section)  Acute Rehab PT Goals Patient Stated Goal: to get better PT Goal Formulation: With patient Time For Goal Achievement: 06/14/20 Potential to Achieve Goals: Good    Frequency Min 5X/week   Barriers to discharge      Co-evaluation               AM-PAC PT "6 Clicks" Mobility  Outcome Measure Help needed turning from your back to your side while in a flat bed without using bedrails?: None Help needed moving from lying on your back to sitting on the side of a flat bed without using bedrails?: None Help  needed moving to and from a bed to a chair (including a wheelchair)?: A Little Help needed standing up from a chair using your arms (e.g., wheelchair or bedside chair)?: A Little Help needed to walk in hospital room?: A Little Help needed climbing 3-5 steps with a railing? : A Little 6 Click Score: 20    End of Session Equipment Utilized During Treatment: Gait belt Activity Tolerance: Patient tolerated treatment well;Patient limited by pain Patient left: in bed;with call bell/phone within reach Nurse Communication: Mobility status PT Visit Diagnosis: Difficulty in walking, not elsewhere classified (R26.2);Pain Pain - Right/Left: Left Pain - part of body: Leg (back)    Time: 2671-2458 PT Time Calculation (min) (ACUTE ONLY): 21 min   Charges:   PT Evaluation $PT Eval Moderate Complexity: 1 Mod          Vale Haven, PT, DPT Acute Rehabilitation Services Pager [phone] Office [phone]      Blake Divine A Lanier Ensign 05/31/2020, 8:46 AM

## 2020-05-31 NOTE — Progress Notes (Signed)
Patient is discharged from room 3C06 at this time. Alert and in stable condition. IV site d/c'd and instructions read to patient with understanding verbalized and all questions answered. Left unit via wheelchair with all belongings at side.  

## 2020-05-31 NOTE — Evaluation (Signed)
Occupational Therapy Evaluation Patient Details Name: Melinda Stone MRN: [id number] DOB: 11-08-1980 Today's Date: 05/31/2020    History of Present Illness Patient is a 40 y/o female who presents s/p L4 laminectomy including bilateral partial facetectomy, left L5 hemilaminectomy, L4-5 bilateral microdiscectomy, left L5-S1 microdiscectomy 3/14. PMH includes DM and PNA.   Clinical Impression   PTA, pt was living with her children and was independent. Currently, pt requires Supervision for ADLs and functional mobility. Provided education and handout on back precautions, bed mobility, LB ADLs, toileting, and tub transfer; pt demonstrated understanding. Answered all pt questions. Recommend dc home once medically stable per physician. All acute OT needs met and will sign off. Thank you.    Follow Up Recommendations  No OT follow up    Equipment Recommendations  None recommended by OT    Recommendations for Other Services       Precautions / Restrictions Precautions Precautions: Back Precaution Booklet Issued: Yes (comment) Precaution Comments: Reviewed back precautions and handout Required Braces or Orthoses: Other Brace Cervical Brace: Hard collar;At all times Other Brace: No brace per MD order Restrictions Weight Bearing Restrictions: No      Mobility Bed Mobility Overal bed mobility: Needs Assistance Bed Mobility: Rolling;Sidelying to Sit Rolling: Modified independent (Device/Increase time) Sidelying to sit: Modified independent (Device/Increase time) Supine to sit: Supervision;HOB elevated     General bed mobility comments: HOB flat, no use of rails to simulate home, cues for log roll technique. Slow.    Transfers Overall transfer level: Needs assistance Equipment used: None Transfers: Sit to/from Stand Sit to Stand: Supervision         General transfer comment: Supervision for safety. Pt walking up LEs to rise. Stood from [employer], from toilet x1.     Balance Overall balance assessment: Needs assistance Sitting-balance support: Feet supported;No upper extremity supported Sitting balance-Leahy Scale: Good     Standing balance support: During functional activity Standing balance-Leahy Scale: Good Standing balance comment: Able to stand at sink and wash hands with no UE support                           ADL either performed or assessed with clinical judgement   ADL Overall ADL's : Needs assistance/impaired                                       General ADL Comments: Pt performing ADLs and functional mobility at Supervision level. Providing education and handout for back precautions, bed mobility, UB ADLs, LB ADLs, toileting, and tub transfer. Pt demonstrated understanding     Vision         Perception     Praxis      Pertinent Vitals/Pain Pain Assessment: Faces Pain Score: 9  Faces Pain Scale: Hurts even more Pain Location: back, LLE Pain Descriptors / Indicators: Cramping;Sore Pain Intervention(s): Monitored during session;Limited activity within patient's tolerance;Repositioned     Hand Dominance     Extremity/Trunk Assessment Upper Extremity Assessment Upper Extremity Assessment: Overall WFL for tasks assessed   Lower Extremity Assessment Lower Extremity Assessment: Defer to PT evaluation LLE Deficits / Details: Cramping, functional strength noted   Cervical / Trunk Assessment Cervical / Trunk Assessment: Other exceptions Cervical / Trunk Exceptions: s/p back surgery   Communication Communication Communication: No difficulties   Cognition Arousal/Alertness: Awake/alert Behavior During Therapy: WFL for tasks assessed/performed  Overall Cognitive Status: Within Functional Limits for tasks assessed                                 General Comments: Able to state specific dates about rehab and appts, recalling people from hospital stay. Likely age appropriate.    General Comments  Bandage- clean, dry and intact.    Exercises     Shoulder Instructions      Home Living Family/patient expects to be discharged to:: Private residence Living Arrangements: Children (52, 6, 56 year old) Available Help at Discharge: Family Type of Home: Apartment Home Access: Stairs to enter Technical brewer of Steps: 10 Entrance Stairs-Rails: Right Home Layout: One level     Bathroom Shower/Tub: Teacher, early years/pre: Standard     Home Equipment: None          Prior Functioning/Environment Level of Independence: Independent        Comments: Reports she was independent for ADLs/walking but moving slowly and in pain. Children help take care of her. Does not work, likes to read.        OT Problem List: Decreased strength;Decreased range of motion;Decreased activity tolerance;Impaired balance (sitting and/or standing);Decreased knowledge of precautions;Decreased knowledge of use of DME or AE;Pain      OT Treatment/Interventions:      OT Goals(Current goals can be found in the care plan section) Acute Rehab OT Goals Patient Stated Goal: to get better OT Goal Formulation: All assessment and education complete, DC therapy  OT Frequency:     Barriers to D/C:            Co-evaluation              AM-PAC OT "6 Clicks" Daily Activity     Outcome Measure Help from another person eating meals?: None Help from another person taking care of personal grooming?: None Help from another person toileting, which includes using toliet, bedpan, or urinal?: A Little Help from another person bathing (including washing, rinsing, drying)?: A Little Help from another person to put on and taking off regular upper body clothing?: None Help from another person to put on and taking off regular lower body clothing?: A Little 6 Click Score: 21   End of Session Nurse Communication: Mobility status  Activity Tolerance: Patient tolerated  treatment well Patient left: in bed;with call bell/phone within reach  OT Visit Diagnosis: Unsteadiness on feet (R26.81);Other abnormalities of gait and mobility (R26.89);Muscle weakness (generalized) (M62.81);Pain Pain - part of body:  (Back)                Time: 3149-7026 OT Time Calculation (min): 14 min Charges:  OT General Charges $OT Visit: 1 Visit OT Evaluation $OT Eval Low Complexity: [place], OTR/L Acute Rehab Pager: [phone] Office: [place] 05/31/2020, 9:03 AM

## 2020-05-31 NOTE — Progress Notes (Signed)
   Subjective: 1 Day Post-Op Procedure(s) (LRB): Lumbar five Laminectomy,  Bilateral Microdiscectomy, Left Lumbar five -Sacral one Microdiscectomy (N/A) Patient reports pain as moderate.    Objective: Vital signs in last 24 hours: Temp:  [97.1 F (36.2 C)-99.9 F (37.7 C)] 98.3 F (36.8 C) (03/15 0738) Pulse Rate:  [89-106] 106 (03/15 0738) Resp:  [11-20] 16 (03/15 0738) BP: (103-169)/(64-99) 106/70 (03/15 0738) SpO2:  [100 %] 100 % (03/15 0738) Weight:  [88.4 kg] 88.4 kg (03/14 1031)  Intake/Output from previous day: 03/14 0701 - 03/15 0700 In: 1400 [I.V.:1300; IV Piggyback:100] Out: 200 [Blood:200] Intake/Output this shift: No intake/output data recorded.  No results for input(s): HGB in the last 72 hours. No results for input(s): WBC, RBC, HCT, PLT in the last 72 hours. No results for input(s): NA, K, CL, CO2, BUN, CREATININE, GLUCOSE, CALCIUM in the last 72 hours. No results for input(s): LABPT, INR in the last 72 hours.  Neurologically intact DG Lumbar Spine 2-3 Views  Result Date: 05/30/2020 CLINICAL DATA:  Intraoperative localization image for patient undergoing L4-5 and L5-S1 surgery. EXAM: LUMBAR SPINE - 2-3 VIEW COMPARISON:  Plain films lumbar spine 03/21/2020. MRI lumbar spine 05/15/2020. FINDINGS: Metallic probes are seen at the level of the L4 pedicles and superior aspect of S1. IMPRESSION: Localization as above. Electronically Signed   By: Drusilla Kanner M.D.   On: 05/30/2020 15:39    Assessment/Plan: 1 Day Post-Op Procedure(s) (LRB): Lumbar five Laminectomy,  Bilateral Microdiscectomy, Left Lumbar five -Sacral one Microdiscectomy (N/A) Up with therapy, walking in the hall this AM with PT. Discharge home office one week.   [name] 05/31/2020, 7:54 AM

## 2020-06-01 NOTE — Anesthesia Postprocedure Evaluation (Signed)
Anesthesia Post Note  Patient: Melinda Stone  Procedure(s) Performed: Lumbar five Laminectomy,  Bilateral Microdiscectomy, Left Lumbar five -Sacral one Microdiscectomy (N/A Spine Lumbar)     Patient location during evaluation: PACU Anesthesia Type: General Level of consciousness: sedated and patient cooperative Pain management: pain level controlled Vital Signs Assessment: post-procedure vital signs reviewed and stable Respiratory status: spontaneous breathing Cardiovascular status: stable Anesthetic complications: no   No complications documented.  Last Vitals:  Vitals:   05/31/20 0330 05/31/20 0738  BP: 103/64 106/70  Pulse: 100 (!) 106  Resp: 20 16  Temp: 36.9 C 36.8 C  SpO2: 100% 100%    Last Pain:  Vitals:   05/31/20 0910  TempSrc:   PainSc: 4                  Lewie Loron

## 2020-06-07 ENCOUNTER — Encounter: Payer: Self-pay | Admitting: Orthopaedic Surgery

## 2020-06-07 ENCOUNTER — Ambulatory Visit (INDEPENDENT_AMBULATORY_CARE_PROVIDER_SITE_OTHER): Payer: Medicaid Other | Admitting: Orthopaedic Surgery

## 2020-06-07 DIAGNOSIS — Z9889 Other specified postprocedural states: Secondary | ICD-10-CM

## 2020-06-07 NOTE — Progress Notes (Signed)
   Post-Op Visit Note   Patient: Melinda Stone           Date of Birth: May 15, 1980           MRN: [id number] Visit Date: 06/07/2020 PCP: Georgina Quint, MD   Assessment & Plan: Post decompression for extra large disc herniation L4-5 that migrated almost on the L5-S1.  Bilateral procedure and left side L5-S1.  Staples intact.  Chief Complaint:  Chief Complaint  Patient presents with  . Lower Back - Routine Post Op   Visit Diagnoses:  1. S/P lumbar microdiscectomy     Plan: New dressing applied return 1 week for staple removal.  Avoid sitting continue walking daily.  Follow-Up Instructions: No follow-ups on file.   Orders:  No orders of the defined types were placed in this encounter.  No orders of the defined types were placed in this encounter.   Imaging: No results found.  PMFS History: Patient Active Problem List   Diagnosis Date Noted  . S/P lumbar microdiscectomy 06/07/2020  . GI bleed 12/01/2019  . Muscle spasm 11/06/2019  . Sciatica 11/06/2019  . Type 2 diabetes mellitus with diabetic polyneuropathy, without long-term current use of insulin (HCC) 12/03/2018  . Type 2 diabetes mellitus with hyperglycemia, without long-term current use of insulin (HCC) 05/15/2018   Past Medical History:  Diagnosis Date  . Anemia   . Diabetes mellitus without complication (HCC)   . Ovarian cyst   . Pneumonia     Family History  Problem Relation Age of Onset  . Diabetes Mother   . Hypertension Mother   . Stroke Mother     Past Surgical History:  Procedure Laterality Date  . CESAREAN SECTION    . CHOLECYSTECTOMY  2004  . LUMBAR LAMINECTOMY N/A 05/30/2020   Procedure: Lumbar five Laminectomy,  Bilateral Microdiscectomy, Left Lumbar five -Sacral one Microdiscectomy;  Surgeon: [name], MD;  Location: [facility] OR;  Service: Orthopedics;  Laterality: N/A;  . TUBAL LIGATION  10/19/2010   Social History   Occupational History  . Not on file  Tobacco Use  .  Smoking status: Current Some Day Smoker  . Smokeless tobacco: Never Used  . Tobacco comment: black and milds per paitent 1-2/day  Vaping Use  . Vaping Use: Never used  Substance and Sexual Activity  . Alcohol use: Yes    Comment: socially   . Drug use: Yes    Types: Marijuana    Comment: per paitent stopped smoking marijuana at the begining of March 2022  . Sexual activity: Not on file

## 2020-06-09 ENCOUNTER — Telehealth: Payer: Self-pay | Admitting: Orthopaedic Surgery

## 2020-06-09 NOTE — Telephone Encounter (Signed)
Can you please call patient and see what she is needing to come pick up from [facility] office?

## 2020-06-09 NOTE — Telephone Encounter (Signed)
Patient came into the office to get a Mepilex bandage

## 2020-06-09 NOTE — Telephone Encounter (Signed)
Patient called requesting Toula Moos return a phone call. Patient is asking if her daughter can come pick up extra bandages for her back. Patient phone number is [phone].

## 2020-06-14 ENCOUNTER — Ambulatory Visit (INDEPENDENT_AMBULATORY_CARE_PROVIDER_SITE_OTHER): Payer: Medicaid Other | Admitting: Orthopaedic Surgery

## 2020-06-14 ENCOUNTER — Encounter: Payer: Self-pay | Admitting: Orthopaedic Surgery

## 2020-06-14 ENCOUNTER — Other Ambulatory Visit: Payer: Self-pay

## 2020-06-14 VITALS — BP 119/81 | HR 125 | Ht 69.0 in | Wt 192.0 lb

## 2020-06-14 DIAGNOSIS — Z79899 Other long term (current) drug therapy: Secondary | ICD-10-CM | POA: Insufficient documentation

## 2020-06-14 DIAGNOSIS — Z789 Other specified health status: Secondary | ICD-10-CM | POA: Insufficient documentation

## 2020-06-14 DIAGNOSIS — M48061 Spinal stenosis, lumbar region without neurogenic claudication: Secondary | ICD-10-CM | POA: Insufficient documentation

## 2020-06-14 DIAGNOSIS — G894 Chronic pain syndrome: Secondary | ICD-10-CM | POA: Insufficient documentation

## 2020-06-14 DIAGNOSIS — F1291 Cannabis use, unspecified, in remission: Secondary | ICD-10-CM | POA: Insufficient documentation

## 2020-06-14 DIAGNOSIS — Z87898 Personal history of other specified conditions: Secondary | ICD-10-CM | POA: Insufficient documentation

## 2020-06-14 DIAGNOSIS — R937 Abnormal findings on diagnostic imaging of other parts of musculoskeletal system: Secondary | ICD-10-CM | POA: Insufficient documentation

## 2020-06-14 DIAGNOSIS — M899 Disorder of bone, unspecified: Secondary | ICD-10-CM | POA: Insufficient documentation

## 2020-06-14 DIAGNOSIS — F1991 Other psychoactive substance use, unspecified, in remission: Secondary | ICD-10-CM | POA: Insufficient documentation

## 2020-06-14 DIAGNOSIS — M5126 Other intervertebral disc displacement, lumbar region: Secondary | ICD-10-CM

## 2020-06-14 DIAGNOSIS — F1290 Cannabis use, unspecified, uncomplicated: Secondary | ICD-10-CM | POA: Insufficient documentation

## 2020-06-14 DIAGNOSIS — R892 Abnormal level of other drugs, medicaments and biological substances in specimens from other organs, systems and tissues: Secondary | ICD-10-CM | POA: Insufficient documentation

## 2020-06-14 MED ORDER — METHOCARBAMOL 500 MG PO TABS: 500.0000 mg | ORAL_TABLET | Freq: Four times a day (QID) | ORAL | 0 refills | Status: DC | PRN

## 2020-06-14 NOTE — Discharge Summary (Signed)
Patient ID: Melinda Stone MRN: [id number] DOB/AGE: [birth year] 40 y.o.  Admit date: 05/30/2020 Discharge date: 05/31/2020 Admission Diagnoses:  Active Problems:   Lumbar disc herniation   Discharge Diagnoses:  Active Problems:   Lumbar disc herniation  status post Procedure(s): Lumbar five Laminectomy,  Bilateral Microdiscectomy, Left Lumbar five -Sacral one Microdiscectomy  Past Medical History:  Diagnosis Date  . Anemia   . Diabetes mellitus without complication (HCC)   . Ovarian cyst   . Pneumonia     Surgeries: Procedure(s): Lumbar five Laminectomy,  Bilateral Microdiscectomy, Left Lumbar five -Sacral one Microdiscectomy on 05/30/2020   Consultants:   Discharged Condition: Improved  Hospital Course: Melinda Stone is an 40 y.o. female who was admitted 05/30/2020 for operative treatment of lumbar HNP. Patient failed conservative treatments (please see the history and physical for the specifics) and had severe unremitting pain that affects sleep, daily activities and work/hobbies. After pre-op clearance, the patient was taken to the operating room on 05/30/2020 and underwent  Procedure(s): Lumbar five Laminectomy,  Bilateral Microdiscectomy, Left Lumbar five -Sacral one Microdiscectomy.    Patient was given perioperative antibiotics:  Anti-infectives (From admission, onward)   Start     Dose/Rate Route Frequency Ordered Stop   05/30/20 1200  ceFAZolin (ANCEF) IVPB 2g/100 mL premix        2 g 200 mL/hr over 30 Minutes Intravenous On call to O.R. 05/30/20 1021 05/30/20 1255       Patient was given sequential compression devices and early ambulation to prevent DVT.   Patient benefited maximally from hospital stay and there were no complications. At the time of discharge, the patient was urinating/moving their bowels without difficulty, tolerating a regular diet, pain is controlled with oral pain medications and they have been cleared by PT/OT.   Recent  vital signs: No data found.   Recent laboratory studies: No results for input(s): WBC, HGB, HCT, PLT, NA, K, CL, CO2, BUN, CREATININE, GLUCOSE, INR, CALCIUM in the last 72 hours.  Invalid input(s): PT, 2   Discharge Medications:   Allergies as of 05/31/2020   No Known Allergies     Medication List    STOP taking these medications   acetaminophen 500 MG tablet Commonly known as: TYLENOL   traMADol 50 MG tablet Commonly known as: ULTRAM     TAKE these medications   ARTIFICIAL TEARS OP Place 1 drop into both eyes daily as needed (Dry eyes).   DULoxetine 30 MG capsule Commonly known as: Cymbalta Take 1 capsule (30 mg total) by mouth daily.   FeroSul 325 (65 FE) MG tablet Generic drug: ferrous sulfate Take 325 mg by mouth daily with breakfast.   ferrous gluconate 324 MG tablet Commonly known as: FERGON Take 1 tablet (324 mg total) by mouth daily with breakfast.   gabapentin 600 MG tablet Commonly known as: NEURONTIN Take 1 tablet (600 mg total) by mouth 2 (two) times daily AND 2 tablets (1,200 mg total) at bedtime.   glipiZIDE 10 MG tablet Commonly known as: GLUCOTROL Take 1 tablet (10 mg total) by mouth 2 (two) times daily before a meal.   methocarbamol 500 MG tablet Commonly known as: Robaxin Take 1 tablet (500 mg total) by mouth every 6 (six) hours as needed for muscle spasms.   OneTouch Verio test strip Generic drug: glucose blood 2x daily   oxyCODONE-acetaminophen 5-325 MG tablet Commonly known as: PERCOCET/ROXICET Take 1-2 tablets by mouth every 6 (six) hours as needed for severe pain.  Diagnostic Studies: DG Lumbar Spine 2-3 Views  Result Date: 05/30/2020 CLINICAL DATA:  Intraoperative localization image for patient undergoing L4-5 and L5-S1 surgery. EXAM: LUMBAR SPINE - 2-3 VIEW COMPARISON:  Plain films lumbar spine 03/21/2020. MRI lumbar spine 05/15/2020. FINDINGS: Metallic probes are seen at the level of the L4 pedicles and superior aspect of  S1. IMPRESSION: Localization as above. Electronically Signed   By: Drusilla Kanner M.D.   On: 05/30/2020 15:39   MR Lumbar Spine Wo Contrast  Result Date: 05/16/2020 CLINICAL DATA:  Chronic low back pain with radiculopathy EXAM: MRI LUMBAR SPINE WITHOUT CONTRAST TECHNIQUE: Multiplanar, multisequence MR imaging of the lumbar spine was performed. No intravenous contrast was administered. COMPARISON:  MRI 04/21/2020 FINDINGS: Segmentation:  Standard. Alignment:  Physiologic. Vertebrae:  No fracture, evidence of discitis, or bone lesion. Conus medullaris and cauda equina: Conus extends to the L1 level. Conus and cauda equina appear normal. Paraspinal and other soft tissues: Retroverted uterus. 1.5 cm anterior uterine body fibroid. Trace free fluid within the cul-de-sac, likely physiologic. Otherwise negative. Disc levels: T12-L1: Negative. L1-L2: Negative. L2-L3: Negative. L3-L4: Negative. L4-L5: Large left paracentral disc extrusion with 2.3 cm caudal extension resulting in severe canal stenosis with effacement of the left subarticular recess. Mild bilateral foraminal narrowing. Findings unchanged from prior. L5-S1: Moderate left paracentral disc protrusion resulting in moderate canal stenosis and left subarticular recess effacement. Foramina remain patent. Findings unchanged from prior. IMPRESSION: 1. Large left paracentral disc extrusion at L4-L5 resulting in severe canal stenosis and effacement of the left subarticular recess. 2. Moderate left paracentral disc protrusion at L5-S1 resulting in moderate canal stenosis and left subarticular recess effacement. Electronically Signed   By: Duanne Guess D.O.   On: 05/16/2020 10:32   MR ANKLE RIGHT WO CONTRAST  Result Date: 05/16/2020 CLINICAL DATA:  Medial right ankle pain for 2 years. No known injury. EXAM: MRI OF THE RIGHT ANKLE WITHOUT CONTRAST TECHNIQUE: Multiplanar, multisequence MR imaging of the ankle was performed. No intravenous contrast was  administered. COMPARISON:  Plain films right ankle 05/02/2020. FINDINGS: TENDONS Peroneal: Intact and normal in appearance. Posteromedial: Intact and normal in appearance. Anterior: Intact and normal in appearance. Achilles: Intact and normal in appearance. Plantar Fascia: Intact and normal in appearance. LIGAMENTS Lateral: Intact and normal in appearance. Medial: Intact and normal in appearance. CARTILAGE Ankle Joint: Normal. No osteochondral lesion of the talar dome or joint effusion. Subtalar Joints/Sinus Tarsi: Normal. Bones: Normal marrow signal throughout without fracture, stress change or focal lesion. No tarsal coalition. Other: None.  No fluid collection or mass. IMPRESSION: Normal MRI right ankle. Electronically Signed   By: Drusilla Kanner M.D.   On: 05/16/2020 10:26    Discharge Instructions    Incentive spirometry RT   Complete by: As directed        Follow-up Information    Schedule an appointment as soon as possible for a visit with [name], MD.   Specialty: Orthopedic Surgery Why: need return office visit with dr yates one week postop.  call to schedule appointment.  Contact information: [address] [place] [address] [phone]               Discharge Plan:  discharge to home  Disposition:     Signed: Zonia Kief  06/14/2020, 10:14 AM

## 2020-06-14 NOTE — Progress Notes (Signed)
   Post-Op Visit Note   Patient: Melinda Stone           Date of Birth: [birth year]           MRN: [id number] Visit Date: 06/14/2020 PCP: Georgina Quint, MD   Assessment & Plan: Postop microdiscectomy with L5 laminectomy bilateral microdiscectomy L4-5 and on the left at L5-S1.  Staples are harvested today.  Chief Complaint:  Chief Complaint  Patient presents with  . Lower Back - Follow-up    05/30/2020 L5 laminectomy, bilateral microdiscectomy, Left L5-S1 microdiscectomy   Visit Diagnoses:  1. Lumbar disc herniation     Plan: Staples harvested recheck 4 weeks.  Muscle relaxant renewed.  Follow-Up Instructions: Return in about 4 weeks (around 07/12/2020).   Orders:  No orders of the defined types were placed in this encounter.  No orders of the defined types were placed in this encounter.   Imaging: No results found.  PMFS History: Patient Active Problem List   Diagnosis Date Noted  . Chronic pain syndrome 06/14/2020  . Pharmacologic therapy 06/14/2020  . Disorder of skeletal system 06/14/2020  . Problems influencing health status 06/14/2020  . History of marijuana use 06/14/2020  . History of illicit drug use 06/14/2020  . Abnormal drug screen (05/25/2020) 06/14/2020  . Marijuana use 06/14/2020  . Abnormal MRI, lumbar spine (05/16/2020) 06/14/2020  . 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 Lumbar lateral recess stenosis (Left: L4-5, L5-S1) 06/14/2020  . 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 S/P lumbar microdiscectomy 06/07/2020  . Lumbar disc herniation 05/10/2020  . GI bleed 12/01/2019  . Muscle spasm 11/06/2019  . Sciatica 11/06/2019  . Low back pain 02/04/2019  . Type 2 diabetes mellitus with diabetic polyneuropathy, without long-term current use of insulin (HCC) 12/03/2018  . Type 2 diabetes mellitus with hyperglycemia, without long-term current use of insulin (HCC) 05/15/2018    Past Medical History:  Diagnosis Date  . Anemia   . Diabetes mellitus without complication (HCC)   . Ovarian cyst   . Pneumonia     Family History  Problem Relation Age of Onset  . Diabetes Mother   . Hypertension Mother   . Stroke Mother     Past Surgical History:  Procedure Laterality Date  . CESAREAN SECTION    . CHOLECYSTECTOMY  2004  . LUMBAR LAMINECTOMY N/A 05/30/2020   Procedure: Lumbar five Laminectomy,  Bilateral Microdiscectomy, Left Lumbar five -Sacral one Microdiscectomy;  Surgeon: [name], MD;  Location: [facility] OR;  Service: Orthopedics;  Laterality: N/A;  . TUBAL LIGATION  10/19/2010   Social History   Occupational History  . Not on file  Tobacco Use  . Smoking status: Current Some Day Smoker  . Smokeless tobacco: Never Used  . Tobacco comment: black and milds per paitent 1-2/day  Vaping Use  . Vaping Use: Never used  Substance and Sexual Activity  . Alcohol use: Yes    Comment: socially   . Drug use: Yes    Types: Marijuana    Comment: per paitent stopped smoking marijuana at the begining of March 2022  . Sexual activity: Not on file

## 2020-06-14 NOTE — Progress Notes (Deleted)
No-show to initial evaluation on 06/15/2020 apparently patient had surgery on 05/30/2020 by Dr. Ophelia Charter.

## 2020-06-15 ENCOUNTER — Encounter: Payer: Self-pay | Admitting: Pain Medicine

## 2020-06-15 ENCOUNTER — Ambulatory Visit: Payer: Medicaid Other | Admitting: Pain Medicine

## 2020-06-15 ENCOUNTER — Encounter: Payer: Self-pay | Admitting: Emergency Medicine

## 2020-06-15 DIAGNOSIS — M48061 Spinal stenosis, lumbar region without neurogenic claudication: Secondary | ICD-10-CM

## 2020-06-15 DIAGNOSIS — M961 Postlaminectomy syndrome, not elsewhere classified: Secondary | ICD-10-CM | POA: Insufficient documentation

## 2020-06-15 DIAGNOSIS — Z87898 Personal history of other specified conditions: Secondary | ICD-10-CM

## 2020-06-15 DIAGNOSIS — Z79899 Other long term (current) drug therapy: Secondary | ICD-10-CM

## 2020-06-15 DIAGNOSIS — R937 Abnormal findings on diagnostic imaging of other parts of musculoskeletal system: Secondary | ICD-10-CM

## 2020-06-15 DIAGNOSIS — R892 Abnormal level of other drugs, medicaments and biological substances in specimens from other organs, systems and tissues: Secondary | ICD-10-CM

## 2020-06-15 DIAGNOSIS — G894 Chronic pain syndrome: Secondary | ICD-10-CM

## 2020-06-15 DIAGNOSIS — Z789 Other specified health status: Secondary | ICD-10-CM

## 2020-06-15 DIAGNOSIS — F1290 Cannabis use, unspecified, uncomplicated: Secondary | ICD-10-CM

## 2020-06-15 DIAGNOSIS — M5126 Other intervertebral disc displacement, lumbar region: Secondary | ICD-10-CM

## 2020-06-15 DIAGNOSIS — M899 Disorder of bone, unspecified: Secondary | ICD-10-CM

## 2020-06-24 ENCOUNTER — Other Ambulatory Visit: Payer: Self-pay | Admitting: Orthopaedic Surgery

## 2020-06-24 ENCOUNTER — Telehealth: Payer: Self-pay | Admitting: Orthopaedic Surgery

## 2020-06-24 MED ORDER — TRAMADOL HCL 50 MG PO TABS: 50.0000 mg | ORAL_TABLET | Freq: Four times a day (QID) | ORAL | 0 refills | Status: DC | PRN

## 2020-06-24 NOTE — Telephone Encounter (Signed)
Pt states she is having very sharp leg pain at night! She said tylenol and motrin is not helping her so can you call something in?

## 2020-06-24 NOTE — Addendum Note (Signed)
Addended by: Rogers Seeds on: 06/24/2020 01:03 PM   Modules accepted: Orders

## 2020-06-24 NOTE — Telephone Encounter (Signed)
OK to refill ultram once thanks

## 2020-06-24 NOTE — Telephone Encounter (Signed)
Refill called to pharmacy. I called patient and advised.  

## 2020-06-24 NOTE — Telephone Encounter (Signed)
Please advise 

## 2020-06-24 NOTE — Telephone Encounter (Signed)
Patient called requesting pre authorization be sent for her tramadol to be refilled. Please call patient at [phone].

## 2020-06-27 NOTE — Telephone Encounter (Signed)
Please advise 

## 2020-06-27 NOTE — Telephone Encounter (Signed)
Done 3 days ago. See betsy note.

## 2020-06-28 NOTE — Telephone Encounter (Signed)
[facility] to verify. They will submit information needed for prior auth to [place].

## 2020-07-07 ENCOUNTER — Other Ambulatory Visit: Payer: Self-pay

## 2020-07-07 ENCOUNTER — Ambulatory Visit (INDEPENDENT_AMBULATORY_CARE_PROVIDER_SITE_OTHER): Payer: Medicaid Other | Admitting: Internal Medicine

## 2020-07-07 ENCOUNTER — Encounter: Payer: Self-pay | Admitting: Internal Medicine

## 2020-07-07 VITALS — BP 122/70 | HR 116 | Ht 69.0 in | Wt 186.5 lb

## 2020-07-07 DIAGNOSIS — R000 Tachycardia, unspecified: Secondary | ICD-10-CM

## 2020-07-07 DIAGNOSIS — E1165 Type 2 diabetes mellitus with hyperglycemia: Secondary | ICD-10-CM | POA: Diagnosis not present

## 2020-07-07 DIAGNOSIS — E1142 Type 2 diabetes mellitus with diabetic polyneuropathy: Secondary | ICD-10-CM | POA: Diagnosis not present

## 2020-07-07 LAB — POCT GLUCOSE (DEVICE FOR HOME USE): POC Glucose: 263 mg/dl — AB (ref 70–99)

## 2020-07-07 LAB — MICROALBUMIN / CREATININE URINE RATIO
Creatinine,U: 106.8 mg/dL
Microalb Creat Ratio: 2.2 mg/g (ref 0.0–30.0)
Microalb, Ur: 2.3 mg/dL — ABNORMAL HIGH (ref 0.0–1.9)

## 2020-07-07 LAB — TSH: TSH: 1.05 u[IU]/mL (ref 0.35–4.50)

## 2020-07-07 LAB — POCT GLYCOSYLATED HEMOGLOBIN (HGB A1C): Hemoglobin A1C: 8 % — AB (ref 4.0–5.6)

## 2020-07-07 LAB — T4, FREE: Free T4: 0.89 ng/dL (ref 0.60–1.60)

## 2020-07-07 MED ORDER — GLIPIZIDE 10 MG PO TABS: 20.0000 mg | ORAL_TABLET | Freq: Two times a day (BID) | ORAL | 3 refills | Status: DC

## 2020-07-07 MED ORDER — ONETOUCH VERIO VI STRP: 1.0000 | ORAL_STRIP | Freq: Two times a day (BID) | 12 refills | Status: DC

## 2020-07-07 MED ORDER — TRULICITY 0.75 MG/0.5ML ~~LOC~~ SOAJ: 0.7500 mg | SUBCUTANEOUS | 6 refills | Status: DC

## 2020-07-07 NOTE — Progress Notes (Signed)
Name: Melinda Stone  Age/ Sex: 40 y.o., female   MRN/ DOB: [id number], 02-05-1981     PCP: Georgina Quint, MD   Reason for Endocrinology Evaluation: Type 2 Diabetes Mellitus  Initial Endocrine Consultative Visit: 12/03/2018    PATIENT IDENTIFIER: Melinda Stone is a 40 y.o. female with a past medical history of T2DM. The patient has followed with Endocrinology clinic since 12/03/2018 for consultative assistance with management of her diabetes.  DIABETIC HISTORY:  Melinda Stone was diagnosed with DMat age 55. Metformin had caused GI issues and hypoglycemia. Her hemoglobin A1c has ranged from 7.0% in 04/2018, peaking at 7.8% in 10/2018  On her initial visit to our clinic she had an A1c of 7.8%. She was on Jardiance, she had a prescription for Trulicity and Jardiance were too costly for her. We added glipizide .     Works at [employer] different shifts  Lives with kids 11 and 8 yrs of age    SUBJECTIVE:   During the last visit (01/07/2020): A1c 6.9% . Continued Glipizide   Today (07/07/2020): [name] is here for a follow up on diabetes.  She checks her blood sugars 0 times in the past month. The patient has not had hypoglycemic episodes since the last clinic visit.    S/P back sx 05/2020 On Abx for abdominal wall infection  She is having palpitations  Since February    HOME DIABETES REGIMEN:   Glipizide 10 mg BID   METER DOWNLOAD SUMMARY: Did not bring      DIABETIC COMPLICATIONS: Microvascular complications:   Left eye retinopathy   Denies: CKD, neuropathy   Last eye exam: Completed 11/2019  Macrovascular complications:    Denies: CAD, PVD, CVA   HISTORY:  Past Medical History:  Past Medical History:  Diagnosis Date  . Anemia   . Diabetes mellitus without complication (HCC)   . Ovarian cyst   . Pneumonia    Past Surgical History:  Past Surgical History:  Procedure Laterality Date  . CESAREAN SECTION    .  CHOLECYSTECTOMY  2004  . LUMBAR LAMINECTOMY N/A 05/30/2020   Procedure: Lumbar five Laminectomy,  Bilateral Microdiscectomy, Left Lumbar five -Sacral one Microdiscectomy;  Surgeon: [name], MD;  Location: [facility] OR;  Service: Orthopedics;  Laterality: N/A;  . TUBAL LIGATION  10/19/2010    Social History:  reports that she has quit smoking. She has never used smokeless tobacco. She reports current alcohol use. She reports current drug use. Drug: Marijuana. Family History:  Family History  Problem Relation Age of Onset  . Diabetes Mother   . Hypertension Mother   . Stroke Mother      HOME MEDICATIONS: Allergies as of 07/07/2020   No Known Allergies     Medication List       Accurate as of July 07, 2020 10:19 AM. If you have any questions, ask your nurse or doctor.        ARTIFICIAL TEARS OP Place 1 drop into both eyes daily as needed (Dry eyes).   DULoxetine 30 MG capsule Commonly known as: Cymbalta Take 1 capsule (30 mg total) by mouth daily.   FeroSul 325 (65 FE) MG tablet Generic drug: ferrous sulfate Take 325 mg by mouth daily with breakfast.   ferrous gluconate 324 MG tablet Commonly known as: FERGON Take 1 tablet (324 mg total) by mouth daily with breakfast.   gabapentin 600 MG tablet Commonly known as: NEURONTIN Take 1 tablet (600 mg total)  by mouth 2 (two) times daily AND 2 tablets (1,200 mg total) at bedtime.   glipiZIDE 10 MG tablet Commonly known as: GLUCOTROL Take 2 tablets (20 mg total) by mouth 2 (two) times daily before a meal. What changed: how much to take Changed by: Scarlette Shorts, MD   methocarbamol 500 MG tablet Commonly known as: Robaxin Take 1 tablet (500 mg total) by mouth every 6 (six) hours as needed for muscle spasms.   OneTouch Verio test strip Generic drug: glucose blood 2x daily   oxyCODONE-acetaminophen 5-325 MG tablet Commonly known as: PERCOCET/ROXICET Take 1-2 tablets by mouth every 6 (six) hours as needed for  severe Stone.   traMADol 50 MG tablet Commonly known as: ULTRAM Take 1 tablet (50 mg total) by mouth every 6 (six) hours as needed.        OBJECTIVE:   Vital Signs: BP 122/70   Pulse (!) 116   Ht 5\' 9"  (1.753 m)   Wt 186 lb 8 oz (84.6 kg)   LMP 06/30/2020   SpO2 98%   BMI 27.54 kg/m   Wt Readings from Last 3 Encounters:  07/07/20 186 lb 8 oz (84.6 kg)  06/14/20 192 lb (87.1 kg)  06/07/20 192 lb (87.1 kg)     Exam: General: Pt appears well and is in NAD  Lungs: Clear with good BS bilat with no rales, rhonchi, or wheezes  Heart: RRR   Extremities: Trace  pretibial edema.   Neuro: MS is good with appropriate affect, pt is alert and Ox3    DM foot exam: 07/08/2019 The skin of the feet is intact without sores or ulcerations. The pedal pulses are 2+ on right and 2+ on left. The sensation is decreased to a screening 5.07, 10 gram monofilament bilaterally     DATA REVIEWED:  Lab Results  Component Value Date   HGBA1C 8.0 (A) 07/07/2020   HGBA1C 6.5 (A) 04/28/2020   HGBA1C 7.4 (A) 03/01/2020   Lab Results  Component Value Date   MICROALBUR 7.5 (H) 12/03/2018   LDLCALC 23 04/28/2020   CREATININE 0.57 05/25/2020   Lab Results  Component Value Date   MICRALBCREAT 15.3 12/03/2018     Lab Results  Component Value Date   CHOL 96 (L) 04/28/2020   HDL 55 04/28/2020   LDLCALC 23 04/28/2020   TRIG 97 04/28/2020   CHOLHDL 1.7 04/28/2020    Results for Favata, Elora L (MRN 06/26/2020) as of 07/08/2020 10:23  Ref. Range 07/07/2020 10:29  TSH Latest Ref Range: 0.35 - 4.50 uIU/mL 1.05  T4,Free(Direct) Latest Ref Range: 0.60 - 1.60 ng/dL 07/09/2020      ASSESSMENT / PLAN / RECOMMENDATIONS:   1) Type 2 Diabetes Mellitus, Poorly  controlled, With Neuropathic complications - Most recent A1c of 8.0 %. Goal A1c < 7.0 %.     - Pt with hyperglycemia since her last visit here. She is compliant with glipizide intake  - Will restart Trulicity , this has been historically  cost prohibitive but with the change of insurance , will try  - Encouraged glucose checks at home, she was provided with a meter    MEDICATIONS:  Continue Glipizide 5 mg BID  EDUCATION / INSTRUCTIONS:  BG monitoring instructions: Patient is instructed to check her blood sugars 2 times a day, fasting and supper time.  Call [facility] if: BG persistently < 70  . I reviewed the Rule of 15 for the treatment of hypoglycemia in detail with the patient.  Literature supplied.   2. Tachycardia :  - TFT's today are normal  - Abdominal wall infection is most likely contributing to this, she is on Abx  F/U in 6 months    Signed electronically by: Lyndle Herrlich, MD  [facility]  [facility] [address] [place] [place], [place] [address] Phone: [phone] FAX: [phone]   CC: Georgina Quint, MD [address] [place] [address] Phone: [phone]  Fax: [phone]  Return to Endocrinology clinic as below: Future Appointments  Date Time Provider Department Center  07/12/2020 10:30 AM [name], MD [facility] None  10/26/2020 10:00 AM Georgina Quint, MD [facility] None

## 2020-07-07 NOTE — Patient Instructions (Addendum)
-   Increase  Glipizide 10 mg, 2 tablet before Breakfast and Before supper  - Start Trulicity 0.75 mg weekly     HOW TO TREAT LOW BLOOD SUGARS (Blood sugar LESS THAN 70 MG/DL)  Please follow the RULE OF 15 for the treatment of hypoglycemia treatment (when your (blood sugars are less than 70 mg/dL)    STEP 1: Take 15 grams of carbohydrates when your blood sugar is low, which includes:   3-4 GLUCOSE TABS  OR  3-4 OZ OF JUICE OR REGULAR SODA OR  ONE TUBE OF GLUCOSE GEL     STEP 2: RECHECK blood sugar in 15 MINUTES STEP 3: If your blood sugar is still low at the 15 minute recheck --> then, go back to STEP 1 and treat AGAIN with another 15 grams of carbohydrates.

## 2020-07-08 ENCOUNTER — Telehealth: Payer: Self-pay | Admitting: Internal Medicine

## 2020-07-08 NOTE — Telephone Encounter (Signed)
This has been submitted to Medicaid. Waiting on response.

## 2020-07-08 NOTE — Telephone Encounter (Signed)
Pt is calling regarding her Insurance needs a PA for Dulaglutide (TRULICITY) 0.75 MG/0.5ML SOPN so she can get her prescription filled

## 2020-07-12 ENCOUNTER — Encounter: Payer: Self-pay | Admitting: Orthopaedic Surgery

## 2020-07-12 ENCOUNTER — Ambulatory Visit (INDEPENDENT_AMBULATORY_CARE_PROVIDER_SITE_OTHER): Payer: Medicaid Other | Admitting: Orthopaedic Surgery

## 2020-07-12 VITALS — BP 108/73 | HR 103 | Ht 69.0 in | Wt 186.0 lb

## 2020-07-12 DIAGNOSIS — Z9889 Other specified postprocedural states: Secondary | ICD-10-CM

## 2020-07-12 NOTE — Progress Notes (Signed)
Post-Op Visit Note   Patient: Melinda Stone           Date of Birth: May 08, 1980           MRN: [id number] Visit Date: 07/12/2020 PCP: Georgina Quint, MD   Assessment & Plan: Post laminectomy L5 with removal of large free fragment left L5-S1 microdiscectomy.  Bilateral microdiscectomy L4-5.  Patient was in a wheelchair before surgery or could only walk a few steps with crutches.  Now she is walking a mile and a half.  She states her legs feel tight from the knee down occasionally she thinks she might have some left leg swelling none noted today.  Incision looks good.  She is gradually progressing up to 2 miles.  She still has problems sleeping at night and we reviewed the PDMP which shows she has been taking some amount of medication usually tramadol sometimes Norco or Percocet for up to a year.  Gradually increase ambulation and I will recheck her in 1 month.  Chief Complaint:  Chief Complaint  Patient presents with  . Lower Back - Routine Post Op   Visit Diagnoses:  1. S/P lumbar microdiscectomy     Plan: Recheck 1 month.  Anterior tib is strong EHL on the left is trace weak along with toe extensors significantly improved anterior tib from preop on the left.  Right side strong.  Follow-Up Instructions: Return in about 1 month (around 08/11/2020).   Orders:  No orders of the defined types were placed in this encounter.  No orders of the defined types were placed in this encounter.   Imaging: No results found.  PMFS History: Patient Active Problem List   Diagnosis Date Noted  . 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 Chronic pain syndrome 06/14/2020  . Pharmacologic therapy 06/14/2020  . Disorder of skeletal system 06/14/2020  . Problems influencing health status 06/14/2020  . History of marijuana use 06/14/2020  . History of illicit drug use 06/14/2020  . Abnormal drug screen (05/25/2020) 06/14/2020  . Marijuana use 06/14/2020  . Abnormal MRI, lumbar  spine (05/16/2020) 06/14/2020  . 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 Lumbar lateral recess stenosis (Left: L4-5, L5-S1) 06/14/2020  . 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 S/P lumbar microdiscectomy 06/07/2020  . Lumbar disc herniation 05/10/2020  . GI bleed 12/01/2019  . Muscle spasm 11/06/2019  . Sciatica 11/06/2019  . Low back pain 02/04/2019  . Type 2 diabetes mellitus with diabetic polyneuropathy, without long-term current use of insulin (HCC) 12/03/2018  . Type 2 diabetes mellitus with hyperglycemia, without long-term current use of insulin (HCC) 05/15/2018   Past Medical History:  Diagnosis Date  . Anemia   . Diabetes mellitus without complication (HCC)   . Ovarian cyst   . Pneumonia     Family History  Problem Relation Age of Onset  . Diabetes Mother   . Hypertension Mother   . Stroke Mother     Past Surgical History:  Procedure Laterality Date  . CESAREAN SECTION    . CHOLECYSTECTOMY  2004  . LUMBAR LAMINECTOMY N/A 05/30/2020   Procedure: Lumbar five Laminectomy,  Bilateral Microdiscectomy, Left Lumbar five -Sacral one Microdiscectomy;  Surgeon: [name], MD;  Location: [facility] OR;  Service: Orthopedics;  Laterality: N/A;  . TUBAL LIGATION  10/19/2010   Social History   Occupational History  . Not on file  Tobacco Use  . Smoking status: Former Games developer  .  Smokeless tobacco: Never Used  . Tobacco comment: black and milds per paitent 1-2/day  Vaping Use  . Vaping Use: Never used  Substance and Sexual Activity  . Alcohol use: Yes    Comment: socially   . Drug use: Yes    Types: Marijuana    Comment: per paitent stopped smoking marijuana at the begining of March 2022  . Sexual activity: Not on file

## 2020-07-13 ENCOUNTER — Other Ambulatory Visit: Payer: Self-pay | Admitting: Internal Medicine

## 2020-07-13 MED ORDER — ACCU-CHEK GUIDE VI STRP: ORAL_STRIP | 11 refills | Status: DC

## 2020-07-13 MED ORDER — ACCU-CHEK GUIDE ME W/DEVICE KIT: PACK | 0 refills | Status: DC

## 2020-07-13 NOTE — Telephone Encounter (Signed)
Received a fax that insurance pays for [employer] Rx for meter and test strips to [facility]

## 2020-07-28 ENCOUNTER — Other Ambulatory Visit: Payer: Self-pay | Admitting: Orthopaedic Surgery

## 2020-07-28 ENCOUNTER — Telehealth: Payer: Self-pay | Admitting: Orthopaedic Surgery

## 2020-07-28 NOTE — Telephone Encounter (Signed)
I called and left her a message. Time to be off pain meds , been taking it for one year

## 2020-07-28 NOTE — Telephone Encounter (Signed)
Pain request a refill Rx for pain & muscle relaxer.    Pt's pharmacy: [facility]  Pt's call back [phone]

## 2020-07-28 NOTE — Telephone Encounter (Signed)
Please advise 

## 2020-08-16 ENCOUNTER — Ambulatory Visit (INDEPENDENT_AMBULATORY_CARE_PROVIDER_SITE_OTHER): Payer: Medicaid Other | Admitting: Orthopaedic Surgery

## 2020-08-16 ENCOUNTER — Other Ambulatory Visit: Payer: Self-pay

## 2020-08-16 ENCOUNTER — Encounter: Payer: Self-pay | Admitting: Orthopaedic Surgery

## 2020-08-16 VITALS — BP 126/86 | HR 97 | Ht 69.0 in | Wt 190.0 lb

## 2020-08-16 DIAGNOSIS — Z9889 Other specified postprocedural states: Secondary | ICD-10-CM

## 2020-08-16 NOTE — Addendum Note (Signed)
Addended by: Rogers Seeds on: 08/16/2020 02:41 PM   Modules accepted: Orders

## 2020-08-16 NOTE — Progress Notes (Signed)
Post-Op Visit Note   Patient: Melinda Stone           Date of Birth: 11-26-1980           MRN: [id number] Visit Date: 08/16/2020 PCP: Georgina Quint, MD   Assessment & Plan: Patient now more than 2 months post microdiscectomy L4-5 bilateral with removal of large migrated fragment and microdiscectomy on the left at L5-S1.  She is walking little bit better still has some problems with swelling in her feet cannot stand long.  She is unable to walk rapidly due to pain.  Incision is well-healed.  We will set her up for some physical therapy.  She worked at [employer] for 17 years but is not able to stand long enough to handle that job currently.  We will set up some physical therapy recheck her in 4 weeks.  If she is having persistent symptoms consider reimaging.  Chief Complaint:  Chief Complaint  Patient presents with  . Lower Back - Follow-up    05/30/2020 L5 laminectomy, bilateral microdiscectomy, Left L5-S1 microdiscectomy   Visit Diagnoses:  1. S/P lumbar microdiscectomy     Plan: We will set patient up for some physical therapy for core strengthening strengthening her back and lower extremities.  Recheck 4 weeks.  Follow-Up Instructions: No follow-ups on file.   Orders:  No orders of the defined types were placed in this encounter.  No orders of the defined types were placed in this encounter.   Imaging: No results found.  PMFS History: Patient Active Problem List   Diagnosis Date Noted  . 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 Chronic pain syndrome 06/14/2020  . Pharmacologic therapy 06/14/2020  . Disorder of skeletal system 06/14/2020  . Problems influencing health status 06/14/2020  . History of marijuana use 06/14/2020  . History of illicit drug use 06/14/2020  . Abnormal drug screen (05/25/2020) 06/14/2020  . Marijuana use 06/14/2020  . Abnormal MRI, lumbar spine (05/16/2020) 06/14/2020  . 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, L5-S1) 06/14/2020  . Lumbar lateral recess stenosis (Left: L4-5, L5-S1) 06/14/2020  . 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 S/P lumbar microdiscectomy 06/07/2020  . Lumbar disc herniation 05/10/2020  . GI bleed 12/01/2019  . Muscle spasm 11/06/2019  . Sciatica 11/06/2019  . Low back pain 02/04/2019  . Type 2 diabetes mellitus with diabetic polyneuropathy, without long-term current use of insulin (HCC) 12/03/2018  . Type 2 diabetes mellitus with hyperglycemia, without long-term current use of insulin (HCC) 05/15/2018   Past Medical History:  Diagnosis Date  . Anemia   . Diabetes mellitus without complication (HCC)   . Ovarian cyst   . Pneumonia     Family History  Problem Relation Age of Onset  . Diabetes Mother   . Hypertension Mother   . Stroke Mother     Past Surgical History:  Procedure Laterality Date  . CESAREAN SECTION    . CHOLECYSTECTOMY  2004  . LUMBAR LAMINECTOMY N/A 05/30/2020   Procedure: Lumbar five Laminectomy,  Bilateral Microdiscectomy, Left Lumbar five -Sacral one Microdiscectomy;  Surgeon: [name], MD;  Location: [facility] OR;  Service: Orthopedics;  Laterality: N/A;  . TUBAL LIGATION  10/19/2010   Social History   Occupational History  . Not on file  Tobacco Use  . Smoking status: Former Games developer  . Smokeless tobacco: Never Used  . Tobacco comment: black and milds per paitent 1-2/day  Vaping Use  . Vaping Use:  Never used  Substance and Sexual Activity  . Alcohol use: Yes    Comment: socially   . Drug use: Yes    Types: Marijuana    Comment: per paitent stopped smoking marijuana at the begining of March 2022  . Sexual activity: Not on file

## 2020-08-17 DIAGNOSIS — M1990 Unspecified osteoarthritis, unspecified site: Secondary | ICD-10-CM

## 2020-08-17 HISTORY — DX: Unspecified osteoarthritis, unspecified site: M19.90

## 2020-08-19 ENCOUNTER — Other Ambulatory Visit: Payer: Self-pay

## 2020-08-19 ENCOUNTER — Ambulatory Visit: Payer: Medicaid Other | Attending: Orthopaedic Surgery

## 2020-08-19 DIAGNOSIS — M5442 Lumbago with sciatica, left side: Secondary | ICD-10-CM | POA: Diagnosis present

## 2020-08-19 DIAGNOSIS — G8929 Other chronic pain: Secondary | ICD-10-CM

## 2020-08-19 DIAGNOSIS — M6281 Muscle weakness (generalized): Secondary | ICD-10-CM | POA: Diagnosis not present

## 2020-08-19 DIAGNOSIS — M79662 Pain in left lower leg: Secondary | ICD-10-CM

## 2020-08-19 DIAGNOSIS — M5441 Lumbago with sciatica, right side: Secondary | ICD-10-CM | POA: Diagnosis present

## 2020-08-19 DIAGNOSIS — R209 Unspecified disturbances of skin sensation: Secondary | ICD-10-CM

## 2020-08-19 DIAGNOSIS — M79661 Pain in right lower leg: Secondary | ICD-10-CM

## 2020-08-19 NOTE — Patient Instructions (Addendum)
[id number]

## 2020-08-19 NOTE — Therapy (Signed)
[facility] [address] [place], [place], [address] Phone: [phone]   Fax:  [phone]  Physical Therapy Evaluation  Patient Details  Name: Melinda Stone MRN: [id number] Date of Birth: [birth year] Referring Provider (PT): [name], MD   Encounter Date: 08/19/2020   PT End of Session - 08/19/20 1023    Visit Number 1    Number of Visits 17    Date for PT Re-Evaluation 08/26/20    Authorization Type [place] [facility]    Authorization Time Period Requested Authorization    PT Start Time [phone]    PT Stop Time 0920    PT Time Calculation (min) 45 min    Activity Tolerance Patient tolerated treatment well;Patient limited by pain    Behavior During Therapy [facility] for tasks assessed/performed           Past Medical History:  Diagnosis Date  . Anemia   . Diabetes mellitus without complication (HCC)   . Ovarian cyst   . Pneumonia     Past Surgical History:  Procedure Laterality Date  . CESAREAN SECTION    . CHOLECYSTECTOMY  2004  . LUMBAR LAMINECTOMY N/A 05/30/2020   Procedure: Lumbar five Laminectomy,  Bilateral Microdiscectomy, Left Lumbar five -Sacral one Microdiscectomy;  Surgeon: [name], MD;  Location: [facility] OR;  Service: Orthopedics;  Laterality: N/A;  . TUBAL LIGATION  10/19/2010    There were no vitals filed for this visit.    Subjective Assessment - 08/19/20 0842    Subjective Pt reports to clinic with primary c/o lumbar pain with associated pain and N/T in BIL knees, calves, and feet s/p L4-S1 microdiscectomy on March 14th. These symptoms pre-dated the surgery by three years and were not resolved by the surgery. Pt reports that she also has diabetic neuropathy in BIL feet that results in balance deficits and not being able to feel surfaces with the bottom of her feet. she states,"I sometimes don't even know if I've walked out of my shoes." She has an appointment with neurology on August 26th to address these  problems. Pt reports that sitting or standing longer than 20 minutes increases her concordant pain. "The more I do, the more it hurts. No matter what I do, there are sharp pains in both my legs at night when laying in bed." Pt reports that Ice helps with her lumbar pain, but nothing helps to decrease her leg pain.    Pertinent History Diabetic neuropathy, microdiscectomy on 05/30/2020    Limitations Sitting;Standing;Walking;House hold activities    How long can you sit comfortably? 20 minutes    How long can you stand comfortably? 20 minutes    How long can you walk comfortably? 20 minutes    Patient Stated Goals Pt would like to return to standing and walking without limitation.    Currently in Pain? Yes    Pain Score 8     Pain Location Back    Pain Orientation Lower;Medial    Pain Descriptors / Indicators Aching;Throbbing    Pain Type Chronic pain    Pain Radiating Towards BIL hips, calves, feet    Pain Onset More than a month ago    Pain Frequency Constant    Aggravating Factors  Sitting, standing, and walking >20 minutes    Pain Relieving Factors Ice (for back pain only)    Effect of Pain on Daily Activities Pt has difficulty cleaning her house and standing in lines due to pain.  [facility] PT Assessment - 08/19/20 0001      Assessment   Medical Diagnosis S/P lumbar microdiscectomy (U54.270)    Referring Provider (PT) [name], MD    Onset Date/Surgical Date 05/30/20    Hand Dominance Right    Next MD Visit 09/10/2020    Prior Therapy Yes, 3 visits about 20 years ago      Precautions   Precautions None      Restrictions   Weight Bearing Restrictions No      Balance Screen   Has the patient fallen in the past 6 months Yes    How many times? 2   Pt was standing up from a chair that had wheels and the pt lost her shoes and fell forward. 2nd fall, pt was walking in her living room and tripped on something on the floor.   Has the patient had a decrease in  activity level because of a fear of falling?  Yes    Is the patient reluctant to leave their home because of a fear of falling?  Yes      Home Environment   Living Environment Private residence    Living Arrangements Children    Available Help at Discharge Family    Type of Home Apartment    Home Access Stairs to enter    Entrance Stairs-Number of Steps 15    Entrance Stairs-Rails Right;Left    Home Layout One level    Home Equipment None      Prior Function   Level of Independence Independent;Independent with community mobility with device;Independent with household mobility with device   Pt required a wheelchair 2 weeks prior to her surgery due to pain and difficulty walking.   Vocation Unemployed    Leisure Activities with her children      Cognition   Overall Cognitive Status Within Functional Limits for tasks assessed      Observation/Other Assessments   Observations Pt's surgical scar healing well with no signs of infection      AROM   Lumbar Flexion 60 with ^pain    Lumbar Extension 15 with pain    Lumbar - Right Side Bend 16cm with ^pain    Lumbar - Left Side Bend 20cm with pain    Lumbar - Right Rotation 65 with pain    Lumbar - Left Rotation 60 with ^pain      Strength   Right Hip Flexion 3/5    Right Hip Extension 3/5    Right Hip ABduction 3/5    Left Hip Flexion 3/5    Left Hip Extension 3/5    Left Hip ABduction 3/5    Right Knee Flexion 5/5    Right Knee Extension 4/5    Left Knee Flexion 5/5    Left Knee Extension 4/5    Right Ankle Dorsiflexion 5/5    Right Ankle Plantar Flexion 5/5    Left Ankle Dorsiflexion 4/5    Left Ankle Plantar Flexion 5/5      Palpation   Palpation comment Lumbar passive accessories not exceeded grade 2 were painful for the patient T10-L5                      Objective measurements completed on examination: See above findings.               PT Education - 08/19/20 1023    Education Details Pt  instructed on importance of early hip strengthening in addressing  her lumbar pain, as well as the proper form when completing the prescribed hip exercises.    Person(s) Educated Patient    Methods Explanation;Demonstration;Verbal cues;Handout    Comprehension Verbalized understanding;Verbal cues required;Returned demonstration            PT Short Term Goals - 08/19/20 1035      PT SHORT TERM GOAL #1   Title Pt will demonstrate understanding and report adherence to early HEP in order to promote independence in the management of her primary impairments.    Baseline HEP provided at initial evaluation    Time 4    Period Weeks    Status New    Target Date 09/16/20      PT SHORT TERM GOAL #2   Title Pt will demonstrate hip strength in all planes of 3+/5 or greater in order to allow for prolonged standing without concordant pain from weak and painful LE musculature.    Baseline Hip strength in all planes =3/5    Time 4    Period Weeks    Status New    Target Date 09/16/20             PT Long Term Goals - 08/19/20 1038      PT LONG TERM GOAL #1   Title Pt will report ability to stand greater than 1 hour  with <3/10 pain in order to cook in the kitchen without seated rest breaks.    Baseline Unable to stand >77minutes without increased pain    Time 8    Period Weeks    Status New    Target Date 10/14/20      PT LONG TERM GOAL #2   Title Pt will report ability to sit greater than 1 hour with <3/10 pain in order to sit down to watch a movie with her children without limitation.    Baseline Unable to sit >20 minutes without increased pain.    Time 8    Period Weeks    Status New    Target Date 10/14/20      PT LONG TERM GOAL #3   Title Pt will demonstrate WNL lumbar AROM in all planes with <3/10 pain in order to dress herself without limitation due to pain.    Baseline All lumbar AROM is limited and painful    Time 8    Period Weeks    Status New    Target Date 10/14/20       PT LONG TERM GOAL #4   Title Pt will demonstrate BIL hip strength in all planes of 4+/5 or greater in order to progress to more advanced LE strengthening exercises in the management of her lumbar and LE pain.    Baseline All hip MMT =3/5    Time 8    Period Weeks    Status New    Target Date 10/14/20                  Plan - 08/19/20 1027    Clinical Impression Statement Pt is a 40yo F who presents with primary c/o chronic LBP with associated pain and N/T in her lower legs and feet lasting for years and persisting following a lumbar microdiscectomy about 2 months ago. She has no restrictions in regard to her surgery. Upon evaluation, her primary impairments include painful and limited lumbar AROM in all planes, weak BIL hip musculature in all planes, positive LE neural tension testing, pain with grade 2 passive accessories with  referral into BIL hips, and impaired sensation in BIL LE L>R. Her symptoms correspond with lumbar radiculopathy. Primary goals of physical therapy will include increased nerve mobility, strengthening, lumbar segmental mobility, and ROM. She will benefit from skilled PT to address her primary impairments and to help her return to her prior level of function without limitation.    Personal Factors and Comorbidities Comorbidity 3+    Comorbidities See medical history in flowsheet    Examination-Activity Limitations Sit;Bend;Locomotion Level;Stand    Examination-Participation Restrictions Interpersonal Relationship;Cleaning;Laundry    Stability/Clinical Decision Making Evolving/Moderate complexity    Clinical Decision Making Moderate    Rehab Potential Good    PT Frequency 2x / week    PT Duration 8 weeks    PT Treatment/Interventions ADLs/Self Care Home Management;Moist Heat;Iontophoresis 4mg /ml Dexamethasone;Electrical Stimulation;Cryotherapy;Traction;Therapeutic activities;Gait training;Stair training;Functional mobility training;Therapeutic exercise;Balance  training;Neuromuscular re-education;Patient/family education;Manual techniques;Scar mobilization;Taping;Dry needling;Passive range of motion    PT Next Visit Plan Assess hip flexor/ hamstring extensbility, assess squat, introduce lumbar mobility/ strengthening exercises, progress hip strengthening exercises    PT Home Exercise Plan [email or web address]    Consulted and Agree with Plan of Care Patient           Patient will benefit from skilled therapeutic intervention in order to improve the following deficits and impairments:  Abnormal gait,Decreased activity tolerance,Decreased balance,Decreased endurance,Decreased mobility,Decreased range of motion,Hypomobility,Difficulty walking,Pain,Impaired sensation  Visit Diagnosis: Muscle weakness (generalized)  Chronic midline low back pain with bilateral sciatica  Unspecified disturbances of skin sensation  Pain in left lower leg  Pain in right lower leg     Problem List Patient Active Problem List   Diagnosis Date Noted  . 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 Chronic pain syndrome 06/14/2020  . Pharmacologic therapy 06/14/2020  . Disorder of skeletal system 06/14/2020  . Problems influencing health status 06/14/2020  . History of marijuana use 06/14/2020  . History of illicit drug use 06/14/2020  . Abnormal drug screen (05/25/2020) 06/14/2020  . Marijuana use 06/14/2020  . Abnormal MRI, lumbar spine (05/16/2020) 06/14/2020  . 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 Lumbar lateral recess stenosis (Left: L4-5, L5-S1) 06/14/2020  . 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 S/P lumbar microdiscectomy 06/07/2020  . Lumbar disc herniation 05/10/2020  . GI bleed 12/01/2019  . Muscle spasm 11/06/2019  . Sciatica 11/06/2019  . Low back pain 02/04/2019  . Type 2 diabetes mellitus with diabetic polyneuropathy, without long-term current use of insulin (HCC) 12/03/2018  . Type 2  diabetes mellitus with hyperglycemia, without long-term current use of insulin (HCC) 05/15/2018    Carmelina DaneYarborough, Algie Cales, PT, DPT 08/19/20 10:46 AM   [facility] [address] [place], [place], [id number] Phone: [phone]   Fax:  [phone]  Name: Edger HouseContraller L Langseth MRN: [id number] Date of Birth: 1981/01/18

## 2020-08-23 ENCOUNTER — Other Ambulatory Visit: Payer: Self-pay

## 2020-08-23 ENCOUNTER — Ambulatory Visit: Payer: Medicaid Other

## 2020-08-23 DIAGNOSIS — G8929 Other chronic pain: Secondary | ICD-10-CM

## 2020-08-23 DIAGNOSIS — M6281 Muscle weakness (generalized): Secondary | ICD-10-CM | POA: Diagnosis not present

## 2020-08-23 DIAGNOSIS — M5442 Lumbago with sciatica, left side: Secondary | ICD-10-CM

## 2020-08-23 NOTE — Therapy (Signed)
[facility] [address] [place], [place], [address] Phone: [phone]   Fax:  [phone]  Physical Therapy Treatment  Patient Details  Name: Melinda Stone MRN: [id number] Date of Birth: [birth year] Referring Provider (PT): [name], MD   Encounter Date: 08/23/2020   PT End of Session - 08/23/20 1031    Visit Number 2    Number of Visits 17    Date for PT Re-Evaluation 10/14/20    Authorization Type [place] MCD    Authorization Time Period Requested Authorization    PT Start Time 1015    PT Stop Time 1058    PT Time Calculation (min) 43 min    Activity Tolerance Patient tolerated treatment well    Behavior During Therapy [facility] for tasks assessed/performed           Past Medical History:  Diagnosis Date  . Anemia   . Diabetes mellitus without complication (HCC)   . Ovarian cyst   . Pneumonia     Past Surgical History:  Procedure Laterality Date  . CESAREAN SECTION    . CHOLECYSTECTOMY  2004  . LUMBAR LAMINECTOMY N/A 05/30/2020   Procedure: Lumbar five Laminectomy,  Bilateral Microdiscectomy, Left Lumbar five -Sacral one Microdiscectomy;  Surgeon: [name], MD;  Location: [facility] OR;  Service: Orthopedics;  Laterality: N/A;  . TUBAL LIGATION  10/19/2010    There were no vitals filed for this visit.   Subjective Assessment - 08/23/20 1018    Subjective [name] reports that the exercises are going fine at home.    Pertinent History Diabetic neuropathy, microdiscectomy on 05/30/2020    Limitations Sitting;Standing;Walking;House hold activities    Patient Stated Goals Pt would like to return to standing and walking without limitation.    Currently in Pain? Yes    Pain Score 7     Pain Location Back    Pain Descriptors / Indicators Throbbing              [facility] PT Assessment - 08/23/20 0001      Flexibility   Hamstrings Right Moderate restriction , Left Mild restrictioin    Quadriceps Moderate  restriction to hip flexors bilaterally    Piriformis Mod restriction bilaterally      Static Standing Balance   Static Standing Balance -  Activities  Single Leg Stance - Right Leg;Single Leg Stance - Left Leg    Static Standing - Comment/# of Minutes Right: 3 sec  Left: 1 sec                         [facility] Adult PT Treatment/Exercise - 08/23/20 0001      Lumbar Exercises: Supine   Pelvic Tilt 10 reps    Pelvic Tilt Limitations 3 sec    Large Ball Abdominal Isometric 5 seconds;15 reps    Large Ball Abdominal Isometric Limitations hooklying      Knee/Hip Exercises: Stretches   Active Hamstring Stretch --   x 20 x 2 Both   Active Hamstring Stretch Limitations supine Sciatic N glides    Hip Flexor Stretch 60 seconds;2 reps;Both      Knee/Hip Exercises: Standing   Hip Flexion Stengthening;Both;2 sets;10 reps;Knee straight    Hip Flexion Limitations Red Tband    Hip ADduction Strengthening;2 sets;10 reps    Hip ADduction Limitations Red Tband    Hip Abduction Stengthening;2 sets;10 reps;Both    Abduction Limitations Red Tband    Hip  Extension Stengthening;2 sets;Both;10 reps;Knee straight    Extension Limitations Red Tband    SLS 10s x 5 each   two finger tip assist Bilaterally     Manual Therapy   Manual Therapy Soft tissue mobilization;Joint mobilization;Manual Traction    Soft tissue mobilization TrP Bilat piriformis, QL                    PT Short Term Goals - 08/19/20 1035      PT SHORT TERM GOAL #1   Title Pt will demonstrate understanding and report adherence to early HEP in order to promote independence in the management of her primary impairments.    Baseline HEP provided at initial evaluation    Time 4    Period Weeks    Status New    Target Date 09/16/20      PT SHORT TERM GOAL #2   Title Pt will demonstrate hip strength in all planes of 3+/5 or greater in order to allow for prolonged standing without concordant pain from weak and painful  LE musculature.    Baseline Hip strength in all planes =3/5    Time 4    Period Weeks    Status New    Target Date 09/16/20             PT Long Term Goals - 08/19/20 1038      PT LONG TERM GOAL #1   Title Pt will report ability to stand greater than 1 hour  with <3/10 pain in order to cook in the kitchen without seated rest breaks.    Baseline Unable to stand >56minutes without increased pain    Time 8    Period Weeks    Status New    Target Date 10/14/20      PT LONG TERM GOAL #2   Title Pt will report ability to sit greater than 1 hour with <3/10 pain in order to sit down to watch a movie with her children without limitation.    Baseline Unable to sit >20 minutes without increased pain.    Time 8    Period Weeks    Status New    Target Date 10/14/20      PT LONG TERM GOAL #3   Title Pt will demonstrate WNL lumbar AROM in all planes with <3/10 pain in order to dress herself without limitation due to pain.    Baseline All lumbar AROM is limited and painful    Time 8    Period Weeks    Status New    Target Date 10/14/20      PT LONG TERM GOAL #4   Title Pt will demonstrate BIL hip strength in all planes of 4+/5 or greater in order to progress to more advanced LE strengthening exercises in the management of her lumbar and LE pain.    Baseline All hip MMT =3/5    Time 8    Period Weeks    Status New    Target Date 10/14/20                 Plan - 08/23/20 1047    Clinical Impression Statement Nealie has limited sciatice nerve mobility bilaterally with tightness of the hips flexors and pirifirmis as well.  She has difficulty with performance of SLS as expected due to DM neuropathy.  She was limited to 3 seconds on the right LE and 1 second on the left LE.  Reviewed the pt's HEP.  Added gentle core stabilization exercises, sciatice nerve mobility, and hip stretches.  STM was performed today as well.  Recommend continued therapy for improvement of functoinal  activity tolerance.    Personal Factors and Comorbidities Comorbidity 3+    Comorbidities See medical history in flowsheet    Examination-Activity Limitations Sit;Bend;Locomotion Level;Stand    Examination-Participation Restrictions Interpersonal Relationship;Cleaning;Laundry    PT Treatment/Interventions ADLs/Self Care Home Management;Moist Heat;Iontophoresis 4mg /ml Dexamethasone;Electrical Stimulation;Cryotherapy;Traction;Therapeutic activities;Gait training;Stair training;Functional mobility training;Therapeutic exercise;Balance training;Neuromuscular re-education;Patient/family education;Manual techniques;Scar mobilization;Taping;Dry needling;Passive range of motion    PT Next Visit Plan STM, Core stabilization progression, hip flexibility, sciatic N mobility, balance training, hip strengthening.    PT Home Exercise Plan [email or web address]    Consulted and Agree with Plan of Care Patient           Patient will benefit from skilled therapeutic intervention in order to improve the following deficits and impairments:  Abnormal gait,Decreased activity tolerance,Decreased balance,Decreased endurance,Decreased mobility,Decreased range of motion,Hypomobility,Difficulty walking,Pain,Impaired sensation  Visit Diagnosis: Muscle weakness (generalized)  Chronic midline low back pain with bilateral sciatica     Problem List Patient Active Problem List   Diagnosis Date Noted  . 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 Chronic pain syndrome 06/14/2020  . Pharmacologic therapy 06/14/2020  . Disorder of skeletal system 06/14/2020  . Problems influencing health status 06/14/2020  . History of marijuana use 06/14/2020  . History of illicit drug use 06/14/2020  . Abnormal drug screen (05/25/2020) 06/14/2020  . Marijuana use 06/14/2020  . Abnormal MRI, lumbar spine (05/16/2020) 06/14/2020  . 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 Lumbar lateral recess  stenosis (Left: L4-5, L5-S1) 06/14/2020  . 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 S/P lumbar microdiscectomy 06/07/2020  . Lumbar disc herniation 05/10/2020  . GI bleed 12/01/2019  . Muscle spasm 11/06/2019  . Sciatica 11/06/2019  . Low back pain 02/04/2019  . Type 2 diabetes mellitus with diabetic polyneuropathy, without long-term current use of insulin (HCC) 12/03/2018  . Type 2 diabetes mellitus with hyperglycemia, without long-term current use of insulin (HCC) 05/15/2018   05/17/2018, PT, DPT, OCS, Crt. DN Sharol Roussel 08/23/2020, 10:59 AM  [facility] [address] [place], [place], [place] Phone: [phone]   Fax:  [phone]  Name: DESIRAE MANCUSI MRN: [name] Date of Birth: 1980/07/08

## 2020-08-25 ENCOUNTER — Ambulatory Visit: Payer: Medicaid Other

## 2020-08-30 ENCOUNTER — Other Ambulatory Visit: Payer: Self-pay | Admitting: Physician Assistant

## 2020-08-30 ENCOUNTER — Other Ambulatory Visit: Payer: Self-pay

## 2020-08-30 ENCOUNTER — Ambulatory Visit
Admission: RE | Admit: 2020-08-30 | Discharge: 2020-08-30 | Disposition: A | Discharge status: Home / Self Care | Payer: Medicaid Other | Source: Ambulatory Visit | Attending: Physician Assistant | Admitting: Physician Assistant

## 2020-08-30 ENCOUNTER — Ambulatory Visit: Payer: Medicaid Other

## 2020-08-30 DIAGNOSIS — M79661 Pain in right lower leg: Secondary | ICD-10-CM

## 2020-08-30 DIAGNOSIS — M545 Low back pain, unspecified: Secondary | ICD-10-CM

## 2020-08-30 DIAGNOSIS — G8929 Other chronic pain: Secondary | ICD-10-CM

## 2020-08-30 DIAGNOSIS — M6281 Muscle weakness (generalized): Secondary | ICD-10-CM

## 2020-08-30 DIAGNOSIS — M79662 Pain in left lower leg: Secondary | ICD-10-CM

## 2020-08-30 DIAGNOSIS — M25562 Pain in left knee: Secondary | ICD-10-CM

## 2020-08-30 DIAGNOSIS — M25561 Pain in right knee: Secondary | ICD-10-CM

## 2020-08-30 NOTE — Patient Instructions (Signed)
Access Code: [email or web address] URL: [email or web address] Date: 08/30/2020 Prepared by: Sharol Roussel  Exercises Hip Extension with Resistance Loop - 1 x daily - 7 x weekly - 2 sets - 10 reps Standing Hip Flexion with Resistance Loop - 1 x daily - 7 x weekly - 2 sets - 10 reps Hip Abduction with Resistance Loop - 1 x daily - 7 x weekly - 2 sets - 10 reps Standing Repeated Hip Adduction with Resistance - 1 x daily - 7 x weekly - 2 sets - 10 reps Prone Press Up On Elbows - 2 x daily - 7 x weekly - 1 sets - 15 reps - 3 hold Supine Piriformis Stretch with Foot on Ground - 2 x daily - 7 x weekly - 1 sets - 3 reps - 30 hold

## 2020-08-30 NOTE — Therapy (Addendum)
[facility] [address] [place], [place], [address] Phone: [phone]   Fax:  [phone]  Physical Therapy Treatment  Patient Details  Name: Melinda Stone MRN: [id number] Date of Birth: July 03, 1980 Referring Provider (PT): [name], MD   Encounter Date: 08/30/2020   PT End of Session - 08/30/20 1020     Visit Number 3    Number of Visits 17    Date for PT Re-Evaluation 10/14/20    Authorization Type [place] M[employer]    PT Start Time 1017    PT Stop Time 1058    PT Time Calculation (min) 41 min    Activity Tolerance Patient tolerated treatment well    Behavior During Therapy [facility] for tasks assessed/performed             Past Medical History:  Diagnosis Date   Anemia    Diabetes mellitus without complication (HCC)    Ovarian cyst    Pneumonia     Past Surgical History:  Procedure Laterality Date   CESAREAN SECTION     CHOLECYSTECTOMY  2004   LUMBAR LAMINECTOMY N/A 05/30/2020   Procedure: Lumbar five Laminectomy,  Bilateral Microdiscectomy, Left Lumbar five -Sacral one Microdiscectomy;  Surgeon: [name], MD;  Location: [facility] OR;  Service: Orthopedics;  Laterality: N/A;   TUBAL LIGATION  10/19/2010    There were no vitals filed for this visit.   Subjective Assessment - 08/30/20 1016     Pertinent History Diabetic neuropathy, microdiscectomy on 05/30/2020    Limitations Sitting;Standing;Walking;House hold activities    Currently in Pain? Yes    Pain Score 8     Pain Location Back                [facility] PT Assessment - 08/30/20 0001       Assessment   Medical Diagnosis S/P lumbar microdiscectomy (V42.595)    Referring Provider (PT) [name], MD    Onset Date/Surgical Date 05/30/20                           [facility] Adult PT Treatment/Exercise - 08/30/20 0001       Lumbar Exercises: Stretches   Lower Trunk Rotation 30 seconds;2 reps    Press Ups 15 reps    Press Ups  Limitations To elbows.    Piriformis Stretch 30 seconds;3 reps;Left      Lumbar Exercises: Supine   Pelvic Tilt 15 reps    Pelvic Tilt Limitations 3 sec      Knee/Hip Exercises: Stretches   Active Hamstring Stretch --   x 20 x 2 Both   Active Hamstring Stretch Limitations supine Sciatic N glides    Hip Flexor Stretch 60 seconds;2 reps;Both      Manual Therapy   Joint Mobilization L5-L3 unilateral L PA grade III    Soft tissue mobilization TrP L piriformis, L QL,LTFL                    PT Education - 08/30/20 1057     Education Details HEP modifications/ update    Person(s) Educated Patient    Methods Explanation;Handout    Comprehension Verbalized understanding              PT Short Term Goals - 08/19/20 1035       PT SHORT TERM GOAL #1   Title Pt will demonstrate understanding and report adherence to early HEP  in order to promote independence in the management of her primary impairments.    Baseline HEP provided at initial evaluation    Time 4    Period Weeks    Status New    Target Date 09/16/20      PT SHORT TERM GOAL #2   Title Pt will demonstrate hip strength in all planes of 3+/5 or greater in order to allow for prolonged standing without concordant pain from weak and painful LE musculature.    Baseline Hip strength in all planes =3/5    Time 4    Period Weeks    Status New    Target Date 09/16/20               PT Long Term Goals - 08/19/20 1038       PT LONG TERM GOAL #1   Title Pt will report ability to stand greater than 1 hour  with <3/10 pain in order to cook in the kitchen without seated rest breaks.    Baseline Unable to stand >38minutes without increased pain    Time 8    Period Weeks    Status New    Target Date 10/14/20      PT LONG TERM GOAL #2   Title Pt will report ability to sit greater than 1 hour with <3/10 pain in order to sit down to watch a movie with her children without limitation.    Baseline Unable to sit >20  minutes without increased pain.    Time 8    Period Weeks    Status New    Target Date 10/14/20      PT LONG TERM GOAL #3   Title Pt will demonstrate WNL lumbar AROM in all planes with <3/10 pain in order to dress herself without limitation due to pain.    Baseline All lumbar AROM is limited and painful    Time 8    Period Weeks    Status New    Target Date 10/14/20      PT LONG TERM GOAL #4   Title Pt will demonstrate BIL hip strength in all planes of 4+/5 or greater in order to progress to more advanced LE strengthening exercises in the management of her lumbar and LE pain.    Baseline All hip MMT =3/5    Time 8    Period Weeks    Status New    Target Date 10/14/20                   Plan - 08/30/20 1050     Clinical Impression Statement The pt arrived to therapy reporting 8/10 low back pain.  She presented today with incrase of left lumbar musculature hypertonicity greater then the right. Performed STM to the left QL, TFL, and piriformis.   Added stretches for these muscles as well.  Progressed to PoE press ups due to post microdiscectomy status.  The patient did tolerate that well.  Her HEP was progressed today with PoE press ups and piriformis stretch.  The patient continues to have an antalgic gait pattern with reduction of gait velocity.  Recommend continued therapy to return to premorbid state.    Personal Factors and Comorbidities Comorbidity 3+    Comorbidities See medical history in flowsheet    Examination-Activity Limitations Sit;Bend;Locomotion Level;Stand    Examination-Participation Restrictions Interpersonal Relationship;Cleaning;Laundry    PT Treatment/Interventions ADLs/Self Care Home Management;Moist Heat;Iontophoresis 4mg /ml Dexamethasone;Electrical Stimulation;Cryotherapy;Traction;Therapeutic activities;Gait training;Stair training;Functional mobility training;Therapeutic exercise;Balance training;Neuromuscular  re-education;Patient/family education;Manual  techniques;Scar mobilization;Taping;Dry needling;Passive range of motion    PT Next Visit Plan STM, Core stabilization progression, hip flexibility, sciatic N mobility, balance training, hip strengthening.    PT Home Exercise Plan [email or web address]    Consulted and Agree with Plan of Care Patient             Patient will benefit from skilled therapeutic intervention in order to improve the following deficits and impairments:  Abnormal gait, Decreased activity tolerance, Decreased balance, Decreased endurance, Decreased mobility, Decreased range of motion, Hypomobility, Difficulty walking, Pain, Impaired sensation  Visit Diagnosis: Muscle weakness (generalized)  Chronic midline low back pain with bilateral sciatica  Pain in left lower leg  Pain in right lower leg     Problem List Patient Active Problem List   Diagnosis Date Noted   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   Chronic pain syndrome 06/14/2020   Pharmacologic therapy 06/14/2020   Disorder of skeletal system 06/14/2020   Problems influencing health status 06/14/2020   History of marijuana use 06/14/2020   History of illicit drug use 06/14/2020   Abnormal drug screen (05/25/2020) 06/14/2020   Marijuana use 06/14/2020   Abnormal MRI, lumbar spine (05/16/2020) 06/14/2020   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 DN  Robet Leu 08/30/2020, 11:08 AM  [facility] [address] [place], [place], [address] Phone: [phone]   Fax:  [phone]  Name: Melinda Stone MRN: [id number] Date of Birth: 1980/12/18

## 2020-09-01 ENCOUNTER — Ambulatory Visit: Payer: Medicaid Other

## 2020-09-06 ENCOUNTER — Ambulatory Visit: Payer: Medicaid Other | Admitting: Physical Therapy

## 2020-09-06 ENCOUNTER — Encounter: Payer: Self-pay | Admitting: Physical Therapy

## 2020-09-06 ENCOUNTER — Other Ambulatory Visit: Payer: Self-pay

## 2020-09-06 DIAGNOSIS — M79662 Pain in left lower leg: Secondary | ICD-10-CM

## 2020-09-06 DIAGNOSIS — M5442 Lumbago with sciatica, left side: Secondary | ICD-10-CM

## 2020-09-06 DIAGNOSIS — M6281 Muscle weakness (generalized): Secondary | ICD-10-CM

## 2020-09-06 DIAGNOSIS — G8929 Other chronic pain: Secondary | ICD-10-CM

## 2020-09-06 DIAGNOSIS — M79661 Pain in right lower leg: Secondary | ICD-10-CM

## 2020-09-06 NOTE — Therapy (Addendum)
[facility] Outpatient Rehabilitation [facility] [address] [place], [place], [address] Phone: [phone]   Fax:  [phone]  Physical Therapy Treatment  Patient Details  Name: Melinda Stone MRN: [id number] Date of Birth: 1980/05/16 Referring Provider (PT): [name], MD   Encounter Date: 09/06/2020   PT End of Session - 09/06/20 1015     Visit Number 4    Number of Visits 17    Date for PT Re-Evaluation 10/14/20    Authorization Type [place] MCD    PT Start Time 1015    PT Stop Time 1058    PT Time Calculation (min) 43 min    Activity Tolerance Patient tolerated treatment well    Behavior During Therapy [facility] for tasks assessed/performed             Past Medical History:  Diagnosis Date   Anemia    Diabetes mellitus without complication (HCC)    Ovarian cyst    Pneumonia     Past Surgical History:  Procedure Laterality Date   CESAREAN SECTION     CHOLECYSTECTOMY  2004   LUMBAR LAMINECTOMY N/A 05/30/2020   Procedure: Lumbar five Laminectomy,  Bilateral Microdiscectomy, Left Lumbar five -Sacral one Microdiscectomy;  Surgeon: [name], MD;  Location: [facility] OR;  Service: Orthopedics;  Laterality: N/A;   TUBAL LIGATION  10/19/2010    There were no vitals filed for this visit.   Subjective Assessment - 09/06/20 1016     Subjective Pt reports that she is doing "so-so".  She feels that the stretches have been helpful.  She felt sore after last visit.    Pertinent History Diabetic neuropathy, microdiscectomy on 05/30/2020    Limitations Sitting;Standing;Walking;House hold activities    Pain Score 8     Pain Location Back    Pain Orientation Lower                [facility] PT Assessment - 09/06/20 0001       Strength   Right Hip Flexion 3+/5    Right Hip Extension 3+/5    Right Hip ABduction 3/5    Left Hip Flexion 3+/5    Left Hip Extension 3+/5    Left Hip ABduction 3/5                           [facility] Adult PT  Treatment/Exercise - 09/06/20 0001       Lumbar Exercises: Stretches   Lower Trunk Rotation 30 seconds;2 reps    Pelvic Tilt Limitations 2x10 5'' hold    Press Ups 15 reps    Press Ups Limitations To elbows.    Piriformis Stretch 30 seconds;3 reps;Left    Other Lumbar Stretch Exercise sciatic nerve glide 2x15 ea in supine      Lumbar Exercises: Aerobic   Nustep 5' L5      Lumbar Exercises: Supine   Clam Limitations RTB alternating - 2x10    Bridge Limitations 2x10 5'' hold - partial ROM    Large Ball Abdominal Isometric 5 seconds;15 reps    Large Ball Abdominal Isometric Limitations hooklying      Knee/Hip Exercises: Stretches   Hip Flexor Stretch 60 seconds;2 reps;Both      Knee/Hip Exercises: Supine   Hip Adduction Isometric Limitations 2x10 5'' hold                      PT Short Term Goals - 09/06/20  1251       PT SHORT TERM GOAL #1   Title Pt will demonstrate understanding and report adherence to early HEP in order to promote independence in the management of her primary impairments.    Baseline HEP provided at initial evaluation    Time 4    Period Weeks    Status New    Target Date 09/16/20      PT SHORT TERM GOAL #2   Title Pt will demonstrate hip strength in all planes of 3+/5 or greater in order to allow for prolonged standing without concordant pain from weak and painful LE musculature.    Baseline Hip strength in all planes =3/5  6/21: Hip flexion and ext improved to 3+/5, hip abduction remains 3/5    Time 4    Period Weeks    Status New    Target Date 09/16/20               PT Long Term Goals - 09/06/20 1252       PT LONG TERM GOAL #1   Title Pt will report ability to stand greater than 1 hour  with <3/10 pain in order to cook in the kitchen without seated rest breaks.    Baseline Unable to stand >26minutes without increased pain  6/21: no change    Time 8    Period Weeks    Status On-going      PT LONG TERM GOAL #2   Title Pt  will report ability to sit greater than 1 hour with <3/10 pain in order to sit down to watch a movie with her children without limitation.    Baseline Unable to sit >20 minutes without increased pain.  6/21: no change    Time 8    Period Weeks    Status On-going      PT LONG TERM GOAL #3   Title Pt will demonstrate WNL lumbar AROM in all planes with <3/10 pain in order to dress herself without limitation due to pain.    Baseline All lumbar AROM is limited and painful 6/21: no change    Time 8    Period Weeks    Status On-going      PT LONG TERM GOAL #4   Title Pt will demonstrate BIL hip strength in all planes of 4+/5 or greater in order to progress to more advanced LE strengthening exercises in the management of her lumbar and LE pain.    Baseline All hip MMT =3/5  6/21: Hip flexion and ext improved to 3+/5, hip abduction remains 3/5    Time 8    Period Weeks    Status New                   Plan - 09/06/20 1054     Clinical Impression Statement Melinda Stone is progressing fair with therapy.  Today we concentrated on core strengthening and lower extremity flexibility .  Pt reported minimal relief with IASTM last session so we concentrated on gentle lumbar strengthening.  Pt required cuing for pelvic tilt but showed good form with practice.  Pt reports no change in baseline pain following therapy.  Next session we will continue current course, progressing as able/appropriate.  HEP was not updated as current program meets patient's needs. Pt has made some improvement in hip strength since begining therapy but remains limited in functional activities d/t long history of chronic pain.    Personal Factors and Comorbidities Comorbidity  3+    Comorbidities See medical history in flowsheet    Examination-Activity Limitations Sit;Bend;Locomotion Level;Stand    Examination-Participation Restrictions Interpersonal Relationship;Cleaning;Laundry    PT Treatment/Interventions  ADLs/Self Care Home Management;Moist Heat;Iontophoresis 4mg /ml Dexamethasone;Electrical Stimulation;Cryotherapy;Traction;Therapeutic activities;Gait training;Stair training;Functional mobility training;Therapeutic exercise;Balance training;Neuromuscular re-education;Patient/family education;Manual techniques;Scar mobilization;Taping;Dry needling;Passive range of motion    PT Next Visit Plan STM, Core stabilization progression, hip flexibility, sciatic N mobility, balance training, hip strengthening.    PT Home Exercise Plan [email or web address]    Consulted and Agree with Plan of Care Patient             Patient will benefit from skilled therapeutic intervention in order to improve the following deficits and impairments:  Abnormal gait, Decreased activity tolerance, Decreased balance, Decreased endurance, Decreased mobility, Decreased range of motion, Hypomobility, Difficulty walking, Pain, Impaired sensation  Visit Diagnosis: Muscle weakness (generalized)  Chronic midline low back pain with bilateral sciatica  Pain in left lower leg  Pain in right lower leg     Problem List Patient Active Problem List   Diagnosis Date Noted   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   Chronic pain syndrome 06/14/2020   Pharmacologic therapy 06/14/2020   Disorder of skeletal system 06/14/2020   Problems influencing health status 06/14/2020   History of marijuana use 06/14/2020   History of illicit drug use 06/14/2020   Abnormal drug screen (05/25/2020) 06/14/2020   Marijuana use 06/14/2020   Abnormal MRI, lumbar spine (05/16/2020) 06/14/2020   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, L5-S1) 06/14/2020   Lumbar lateral recess stenosis (Left: L4-5, L5-S1) 06/14/2020   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) 06/14/2020   S/P lumbar microdiscectomy 06/07/2020   Lumbar disc herniation 05/10/2020   GI bleed 12/01/2019   Muscle spasm 11/06/2019   Sciatica 11/06/2019   Low back  pain 02/04/2019   Type 2 diabetes mellitus with diabetic polyneuropathy, without long-term current use of insulin (HCC) 12/03/2018   Type 2 diabetes mellitus with hyperglycemia, without long-term current use of insulin (HCC) 05/15/2018    05/17/2018 PT, DPT 09/06/20 12:55 PM  [facility] [address] [place], [place], [place] Phone: [phone]   Fax:  [phone]  Name: Melinda Stone MRN: [name] Date of Birth: Jun 08, 1980

## 2020-09-08 ENCOUNTER — Other Ambulatory Visit: Payer: Self-pay

## 2020-09-08 ENCOUNTER — Ambulatory Visit: Payer: Medicaid Other | Admitting: Physical Therapy

## 2020-09-08 ENCOUNTER — Encounter: Payer: Self-pay | Admitting: Physical Therapy

## 2020-09-08 DIAGNOSIS — M6281 Muscle weakness (generalized): Secondary | ICD-10-CM

## 2020-09-08 DIAGNOSIS — R209 Unspecified disturbances of skin sensation: Secondary | ICD-10-CM

## 2020-09-08 DIAGNOSIS — G8929 Other chronic pain: Secondary | ICD-10-CM

## 2020-09-08 DIAGNOSIS — M79661 Pain in right lower leg: Secondary | ICD-10-CM

## 2020-09-08 DIAGNOSIS — M79662 Pain in left lower leg: Secondary | ICD-10-CM

## 2020-09-08 NOTE — Therapy (Signed)
[facility] [address] [place], [place], [address] Phone: [phone]   Fax:  [phone]  Physical Therapy Treatment  Patient Details  Name: Melinda Stone MRN: [id number] Date of Birth: 23-Aug-1980 Referring Provider (PT): [name], MD     Encounter Date: 09/08/2020   PT End of Session - 09/08/20 1217     Visit Number 5    Number of Visits 17    Date for PT Re-Evaluation 10/14/20    Authorization Type [place] MCD    PT Start Time 1217    PT Stop Time 1300    PT Time Calculation (min) 43 min    Activity Tolerance Patient tolerated treatment well    Behavior During Therapy [facility] for tasks assessed/performed             Past Medical History:  Diagnosis Date   Anemia    Diabetes mellitus without complication (HCC)    Ovarian cyst    Pneumonia     Past Surgical History:  Procedure Laterality Date   CESAREAN SECTION     CHOLECYSTECTOMY  2004   LUMBAR LAMINECTOMY N/A 05/30/2020   Procedure: Lumbar five Laminectomy,  Bilateral Microdiscectomy, Left Lumbar five -Sacral one Microdiscectomy;  Surgeon: [name], MD;  Location: [facility] OR;  Service: Orthopedics;  Laterality: N/A;   TUBAL LIGATION  10/19/2010    There were no vitals filed for this visit.   Subjective Assessment - 09/08/20 1222     Subjective Pt reports that she is feeling sore today.  She felt good after last visit and was able to complete some errands after therapy.    Pertinent History Diabetic neuropathy, microdiscectomy on 05/30/2020    Limitations Sitting;Standing;Walking;House hold activities    Pain Score 8     Pain Location Back    Pain Orientation Lower                               [facility] Adult PT Treatment/Exercise - 09/08/20 0001       Lumbar Exercises: Stretches   Lower Trunk Rotation 30 seconds;2 reps    Pelvic Tilt Limitations x10 5'' hold    then with foam roller and gait belt 3x10    Press Ups --     Press Ups Limitations --    Piriformis Stretch 30 seconds;3 reps;Left    Other Lumbar Stretch Exercise sciatic nerve glide 2x15 ea in supine      Lumbar Exercises: Aerobic   Nustep 5' L5      Lumbar Exercises: Supine   Clam Limitations GTB alternating - 2x10    Dead Bug Limitations 2x10 (easy version)    Bridge Limitations 3x10 5'' hold - partial ROM    Large Ball Abdominal Isometric 5 seconds;15 reps    Large Ball Abdominal Isometric Limitations hooklying      Knee/Hip Exercises: Stretches   Hip Flexor Stretch 60 seconds;2 reps;Both      Knee/Hip Exercises: Supine   Hip Adduction Isometric Limitations 2x10 5'' hold                      PT Short Term Goals - 09/06/20 1251       PT SHORT TERM GOAL #1   Title Pt will demonstrate understanding and report adherence to early HEP in order to promote independence in the management of her primary impairments.    Baseline HEP provided at  initial evaluation    Time 4    Period Weeks    Status New    Target Date 09/16/20      PT SHORT TERM GOAL #2   Title Pt will demonstrate hip strength in all planes of 3+/5 or greater in order to allow for prolonged standing without concordant pain from weak and painful LE musculature.    Baseline Hip strength in all planes =3/5  6/21: Hip flexion and ext improved to 3+/5, hip abduction remains 3/5    Time 4    Period Weeks    Status New    Target Date 09/16/20               PT Long Term Goals - 09/06/20 1252       PT LONG TERM GOAL #1   Title Pt will report ability to stand greater than 1 hour  with <3/10 pain in order to cook in the kitchen without seated rest breaks.    Baseline Unable to stand >37minutes without increased pain  6/21: no change    Time 8    Period Weeks    Status On-going      PT LONG TERM GOAL #2   Title Pt will report ability to sit greater than 1 hour with <3/10 pain in order to sit down to watch a movie with her children without limitation.     Baseline Unable to sit >20 minutes without increased pain.  6/21: no change    Time 8    Period Weeks    Status On-going      PT LONG TERM GOAL #3   Title Pt will demonstrate WNL lumbar AROM in all planes with <3/10 pain in order to dress herself without limitation due to pain.    Baseline All lumbar AROM is limited and painful 6/21: no change    Time 8    Period Weeks    Status On-going      PT LONG TERM GOAL #4   Title Pt will demonstrate BIL hip strength in all planes of 4+/5 or greater in order to progress to more advanced LE strengthening exercises in the management of her lumbar and LE pain.    Baseline All hip MMT =3/5  6/21: Hip flexion and ext improved to 3+/5, hip abduction remains 3/5    Time 8    Period Weeks    Status New                   Plan - 09/08/20 1258     Clinical Impression Statement Annis L Nunziata is progressing fair with therapy.  Today we concentrated on core strengthening, lower extremity strengthening, and lower extremity flexibility .  Pt shows ability to increase intensity of mat exercises with no increase in sxs.  She fatigues significantly with mat exercises.  Pt reports mild pain reduction following therapy.  Next session we will continue current course, progressing as able/appropriate.  Possibly add SLR.  HEP was reviewed with patient, but left unchanged.  Pt will continue to benefit from skilled physical therapy to address remaining deficits and achieve listed goals.  Continue per POC.    Personal Factors and Comorbidities Comorbidity 3+    Comorbidities See medical history in flowsheet    Examination-Activity Limitations Sit;Bend;Locomotion Level;Stand    Examination-Participation Restrictions Interpersonal Relationship;Cleaning;Laundry    PT Treatment/Interventions ADLs/Self Care Home Management;Moist Heat;Iontophoresis 4mg /ml Dexamethasone;Electrical Stimulation;Cryotherapy;Traction;Therapeutic activities;Gait training;Stair  training;Functional mobility training;Therapeutic exercise;Balance training;Neuromuscular re-education;Patient/family education;Manual techniques;Scar mobilization;Taping;Dry  needling;Passive range of motion    PT Next Visit Plan STM, Core stabilization progression, hip flexibility, sciatic N mobility, balance training, hip strengthening.    PT Home Exercise Plan [id number]    Consulted and Agree with Plan of Care Patient             Patient will benefit from skilled therapeutic intervention in order to improve the following deficits and impairments:  Abnormal gait, Decreased activity tolerance, Decreased balance, Decreased endurance, Decreased mobility, Decreased range of motion, Hypomobility, Difficulty walking, Pain, Impaired sensation  Visit Diagnosis: Muscle weakness (generalized)  Chronic midline low back pain with bilateral sciatica  Pain in left lower leg  Pain in right lower leg  Unspecified disturbances of skin sensation     Problem List Patient Active Problem List   Diagnosis Date Noted   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   Chronic pain syndrome 06/14/2020   Pharmacologic therapy 06/14/2020   Disorder of skeletal system 06/14/2020   Problems influencing health status 06/14/2020   History of marijuana use 06/14/2020   History of illicit drug use 06/14/2020   Abnormal drug screen (05/25/2020) 06/14/2020   Marijuana use 06/14/2020   Abnormal MRI, lumbar spine (05/16/2020) 06/14/2020   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, L5-S1) 06/14/2020   Lumbar lateral recess stenosis (Left: L4-5, L5-S1) 06/14/2020   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) 06/14/2020   S/P lumbar microdiscectomy 06/07/2020   Lumbar disc herniation 05/10/2020   GI bleed 12/01/2019   Muscle spasm 11/06/2019   Sciatica 11/06/2019   Low back pain 02/04/2019   Type 2 diabetes mellitus with diabetic polyneuropathy, without long-term current use of insulin (HCC)  12/03/2018   Type 2 diabetes mellitus with hyperglycemia, without long-term current use of insulin (HCC) 05/15/2018    Alphonzo Severance PT, DPT 09/08/20 1:00 PM   [facility] [address] [place], [place], [address] Phone: [phone]   Fax:  [phone]  Name: LAURELAI LEPP MRN: [id number] Date of Birth: Dec 05, 1980

## 2020-09-13 ENCOUNTER — Encounter: Payer: Self-pay | Admitting: Orthopaedic Surgery

## 2020-09-13 ENCOUNTER — Ambulatory Visit (INDEPENDENT_AMBULATORY_CARE_PROVIDER_SITE_OTHER): Payer: Medicaid Other | Admitting: Orthopaedic Surgery

## 2020-09-13 VITALS — BP 138/88 | HR 88 | Ht 69.0 in | Wt 194.0 lb

## 2020-09-13 DIAGNOSIS — Z9889 Other specified postprocedural states: Secondary | ICD-10-CM | POA: Diagnosis not present

## 2020-09-13 DIAGNOSIS — M545 Low back pain, unspecified: Secondary | ICD-10-CM

## 2020-09-13 NOTE — Progress Notes (Signed)
Office Visit Note   Patient: Melinda Stone           Date of Birth: 13-Oct-1980           MRN: [id number] Visit Date: 09/13/2020              Requested by: Georgina Quint, MD [address] [place],  [place] [address] PCP: Georgina Quint, MD   Assessment & Plan: Visit Diagnoses:  1. S/P lumbar microdiscectomy   2. Acute bilateral low back pain, unspecified whether sciatica present     Plan: Would recommend proceeding with MRI scan rule out recurrent disc herniation with her significant pain complaints.  Before surgery she was in a wheelchair and can walk 4 steps to 8 steps with crutches.  Now she is ambulating without any walking aids. Lumbar MRI with and without contrast.  We discussed problems with chronic pain medication, difficulty stopping it later, increased dosage required with time, hyperalgesia potential.  Etc. Follow-Up Instructions: No follow-ups on file.   Orders:  Orders Placed This Encounter  Procedures   MR Lumbar Spine W Wo Contrast   No orders of the defined types were placed in this encounter.     Procedures: No procedures performed   Clinical Data: No additional findings.   Subjective: Chief Complaint  Patient presents with   Lower Back - Follow-up    05/30/2020 L5 laminectomy, bilateral microdiscectomy, left L5-S1 microdiscectomy    HPI 40 year old female returns post microdiscectomy 05/30/2020 with massive large extruded fragment of the L4-L5 level.  She had bilateral microdiscectomy with central decompression to the large fragment.  She states she still in considerable pain has pain in the back that radiates into her buttocks down into her legs and some pain in her knees.  She was started on pain management and is now on tramadol 50 mg 120 tablets monthly.  Patient states she is only taking 3 a day.  She states she is walking better but still has considerable amount of pain.  No associated bowel or bladder symptoms.  She is  not had any postop imaging.  Review of Systems updated unchanged since surgery.   Objective: Vital Signs: BP 138/88   Pulse 88   Ht 5\' 9"  (1.753 m)   Wt 194 lb (88 kg)   BMI 28.65 kg/m   Physical Exam Constitutional:      Appearance: She is well-developed.  HENT:     Head: Normocephalic.     Right Ear: External ear normal.     Left Ear: External ear normal. There is no impacted cerumen.  Eyes:     Pupils: Pupils are equal, round, and reactive to light.  Neck:     Thyroid: No thyromegaly.     Trachea: No tracheal deviation.  Cardiovascular:     Rate and Rhythm: Normal rate.  Pulmonary:     Effort: Pulmonary effort is normal.  Abdominal:     Palpations: Abdomen is soft.  Musculoskeletal:     Cervical back: No rigidity.  Skin:    General: Skin is warm and dry.  Neurological:     Mental Status: She is alert and oriented to person, place, and time.  Psychiatric:        Behavior: Behavior normal.    Ortho Exam patient has slow deliberate wide-based gait with bilateral Trendelenburg.  Supine straight leg raising 80 degrees.  Knee and ankle jerk are intact.  Distal pulses are intact.  Lumbar incisions well-healed.  Slight decreased sensation dorsum of the foot right and left.  Patient is ambulating without a cane or walker.  Specialty Comments:  No specialty comments available.  Imaging: No results found.   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, L5-S1) 06/14/2020    Lumbar lateral recess stenosis (Left: L4-5, L5-S1) 06/14/2020   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) 06/14/2020   S/P lumbar microdiscectomy 06/07/2020   Lumbar disc herniation 05/10/2020   GI bleed 12/01/2019   Muscle spasm 11/06/2019   Sciatica 11/06/2019   Low back pain 02/04/2019   Type 2 diabetes mellitus with diabetic polyneuropathy, without long-term current use of insulin (HCC) 12/03/2018   Type 2 diabetes mellitus with hyperglycemia, without long-term current use of insulin (HCC) 05/15/2018   Past Medical History:  Diagnosis Date   Anemia    Diabetes mellitus without complication (HCC)    Ovarian cyst    Pneumonia     Family History  Problem Relation Age of Onset   Diabetes Mother    Hypertension Mother    Stroke Mother     Past Surgical History:  Procedure Laterality Date   CESAREAN SECTION     CHOLECYSTECTOMY  2004   LUMBAR LAMINECTOMY N/A 05/30/2020   Procedure: Lumbar five Laminectomy,  Bilateral Microdiscectomy, Left Lumbar five -Sacral one Microdiscectomy;  Surgeon: [name], MD;  Location: [facility] OR;  Service: Orthopedics;  Laterality: N/A;   TUBAL LIGATION  10/19/2010   Social History   Occupational History   Not on file  Tobacco Use   Smoking status: Former    Pack years: 0.00   Smokeless tobacco: Never   Tobacco comments:    black and milds per paitent 1-2/day  Vaping Use   Vaping Use: Never used  Substance and Sexual Activity   Alcohol use: Yes    Comment: socially    Drug use: Yes    Types: Marijuana    Comment: per paitent stopped smoking marijuana at the begining of March 2022   Sexual activity: Not on file

## 2020-09-15 ENCOUNTER — Ambulatory Visit: Payer: Medicaid Other | Admitting: Physical Therapy

## 2020-09-15 ENCOUNTER — Encounter: Payer: Self-pay | Admitting: Physical Therapy

## 2020-09-15 ENCOUNTER — Other Ambulatory Visit: Payer: Self-pay

## 2020-09-15 DIAGNOSIS — M79661 Pain in right lower leg: Secondary | ICD-10-CM

## 2020-09-15 DIAGNOSIS — M6281 Muscle weakness (generalized): Secondary | ICD-10-CM

## 2020-09-15 DIAGNOSIS — M79662 Pain in left lower leg: Secondary | ICD-10-CM

## 2020-09-15 DIAGNOSIS — G8929 Other chronic pain: Secondary | ICD-10-CM

## 2020-09-15 NOTE — Therapy (Signed)
[facility] [address] [place], [place], [address] Phone: [phone]   Fax:  [phone]  Physical Therapy Treatment  Patient Details  Name: Melinda Stone MRN: [id number] Date of Birth: 10-Jun-1980 Referring Provider (PT): [name], MD   Encounter Date: 09/15/2020   PT End of Session - 09/15/20 1042     Visit Number 6    Number of Visits 17    Date for PT Re-Evaluation 10/14/20    Authorization Type [place] MCD    Authorization Time Period 09/13/20-10/19/20    Authorization - Visit Number 1    Authorization - Number of Visits 12    PT Start Time 1015    PT Stop Time 1100    PT Time Calculation (min) 45 min             Past Medical History:  Diagnosis Date   Anemia    Diabetes mellitus without complication (HCC)    Ovarian cyst    Pneumonia     Past Surgical History:  Procedure Laterality Date   CESAREAN SECTION     CHOLECYSTECTOMY  2004   LUMBAR LAMINECTOMY N/A 05/30/2020   Procedure: Lumbar five Laminectomy,  Bilateral Microdiscectomy, Left Lumbar five -Sacral one Microdiscectomy;  Surgeon: [name], MD;  Location: [facility] OR;  Service: Orthopedics;  Laterality: N/A;   TUBAL LIGATION  10/19/2010    There were no vitals filed for this visit.   Subjective Assessment - 09/15/20 1018     Subjective This helps me get out of the house and do things. I try to do my exercises at home. They make my lower legs tight and my back sore.    Pertinent History Diabetic neuropathy, microdiscectomy on 05/30/2020    Currently in Pain? Yes    Pain Score 8     Pain Location Back    Pain Orientation Lower;Right;Left    Pain Descriptors / Indicators Sore;Throbbing    Pain Type Chronic pain    Aggravating Factors  standing , sit long time, lay down long time    Pain Relieving Factors ice                               [facility] Adult PT Treatment/Exercise - 09/15/20 0001       Ambulation/Gait    Gait Comments cues for increased knee/ hip flexion  and heel strike with gait.      Lumbar Exercises: [place] Ups 15 reps    Press Ups Limitations To elbows.    Piriformis Stretch 30 seconds;3 reps;Left    Piriformis Stretch Limitations pt unable to recall this exerise from HEP    Gastroc Stretch 2 reps;30 seconds;Left;Right      Lumbar Exercises: Aerobic   Nustep 5' L5, UE/LE      Lumbar Exercises: Supine   Bridge Limitations 3 x 10      Knee/Hip Exercises: Stretches   Hip Flexor Stretch 60 seconds;2 reps;Both      Knee/Hip Exercises: Standing   Heel Raises 10 reps    Heel Raises Limitations heel/toe raises x 10    Hip Flexion Limitations marching with UE assist    Hip Abduction 15 reps    Abduction Limitations right and left, cues for posture and eccentric                      PT Short Term Goals -  09/06/20 1251       PT SHORT TERM GOAL #1   Title Pt will demonstrate understanding and report adherence to early HEP in order to promote independence in the management of her primary impairments.    Baseline HEP provided at initial evaluation    Time 4    Period Weeks    Status New    Target Date 09/16/20      PT SHORT TERM GOAL #2   Title Pt will demonstrate hip strength in all planes of 3+/5 or greater in order to allow for prolonged standing without concordant pain from weak and painful LE musculature.    Baseline Hip strength in all planes =3/5  6/21: Hip flexion and ext improved to 3+/5, hip abduction remains 3/5    Time 4    Period Weeks    Status New    Target Date 09/16/20               PT Long Term Goals - 09/06/20 1252       PT LONG TERM GOAL #1   Title Pt will report ability to stand greater than 1 hour  with <3/10 pain in order to cook in the kitchen without seated rest breaks.    Baseline Unable to stand >2minutes without increased pain  6/21: no change    Time 8    Period Weeks    Status On-going      PT LONG TERM GOAL  #2   Title Pt will report ability to sit greater than 1 hour with <3/10 pain in order to sit down to watch a movie with her children without limitation.    Baseline Unable to sit >20 minutes without increased pain.  6/21: no change    Time 8    Period Weeks    Status On-going      PT LONG TERM GOAL #3   Title Pt will demonstrate WNL lumbar AROM in all planes with <3/10 pain in order to dress herself without limitation due to pain.    Baseline All lumbar AROM is limited and painful 6/21: no change    Time 8    Period Weeks    Status On-going      PT LONG TERM GOAL #4   Title Pt will demonstrate BIL hip strength in all planes of 4+/5 or greater in order to progress to more advanced LE strengthening exercises in the management of her lumbar and LE pain.    Baseline All hip MMT =3/5  6/21: Hip flexion and ext improved to 3+/5, hip abduction remains 3/5    Time 8    Period Weeks    Status New                   Plan - 09/15/20 1055     Clinical Impression Statement Pt reports compliance with Most of her HEP. SHe did not recall the piriformis stretch and this was reviewed. She notes lower leg tighness to be her biggest compliant as well as lumbar soreness. Progressed ankle mobility and stretching. Noted decrease heel raise ROM in standing. Began calf stretching. Pt reports standing exercises increase her LE tightness. Continued mat based core stability and trunk/ hip flexibility.After sessiion she reported feeling fine.    PT Next Visit Plan Check goals,  Core stabilization progression, hip flexibility, sciatic N mobility, balance training, hip strengthening.    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   Chronic pain syndrome 06/14/2020   Pharmacologic therapy 06/14/2020   Disorder of skeletal system 06/14/2020   Problems influencing health status 06/14/2020   History of marijuana use 06/14/2020   History of illicit drug use 06/14/2020   Abnormal drug screen (05/25/2020) 06/14/2020   Marijuana use 06/14/2020   Abnormal MRI, lumbar spine (05/16/2020) 06/14/2020   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, L5-S1) 06/14/2020   Lumbar lateral recess stenosis (Left: L4-5, L5-S1) 06/14/2020   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) 06/14/2020   S/P lumbar microdiscectomy 06/07/2020   Lumbar disc herniation 05/10/2020   GI bleed 12/01/2019   Muscle spasm 11/06/2019   Sciatica 11/06/2019   Low back pain 02/04/2019   Type 2 diabetes mellitus with diabetic polyneuropathy, without long-term current use of insulin (HCC) 12/03/2018   Type 2 diabetes mellitus with hyperglycemia, without long-term current use of insulin (HCC) 05/15/2018    Sherrie Mustache, PTA 09/15/2020, 11:34 AM  [facility] [address] [place], [place], [address] Phone: [phone]   Fax:  [phone]  Name: LASHONNE SHULL MRN: [id number] Date of Birth: [birth year]

## 2020-09-20 ENCOUNTER — Ambulatory Visit: Payer: Medicaid Other

## 2020-09-22 ENCOUNTER — Ambulatory Visit: Payer: Medicaid Other | Attending: Orthopaedic Surgery | Admitting: Physical Therapy

## 2020-09-22 DIAGNOSIS — M79661 Pain in right lower leg: Secondary | ICD-10-CM | POA: Insufficient documentation

## 2020-09-22 DIAGNOSIS — G8929 Other chronic pain: Secondary | ICD-10-CM | POA: Insufficient documentation

## 2020-09-22 DIAGNOSIS — M5442 Lumbago with sciatica, left side: Secondary | ICD-10-CM | POA: Insufficient documentation

## 2020-09-22 DIAGNOSIS — M6281 Muscle weakness (generalized): Secondary | ICD-10-CM | POA: Insufficient documentation

## 2020-09-22 DIAGNOSIS — M5441 Lumbago with sciatica, right side: Secondary | ICD-10-CM | POA: Insufficient documentation

## 2020-09-22 DIAGNOSIS — M79662 Pain in left lower leg: Secondary | ICD-10-CM | POA: Insufficient documentation

## 2020-09-22 DIAGNOSIS — R209 Unspecified disturbances of skin sensation: Secondary | ICD-10-CM | POA: Insufficient documentation

## 2020-09-27 ENCOUNTER — Encounter: Payer: Self-pay | Admitting: Physical Therapy

## 2020-09-27 ENCOUNTER — Telehealth: Payer: Self-pay | Admitting: Orthopaedic Surgery

## 2020-09-27 ENCOUNTER — Other Ambulatory Visit: Payer: Self-pay

## 2020-09-27 ENCOUNTER — Ambulatory Visit: Payer: Medicaid Other | Admitting: Physical Therapy

## 2020-09-27 DIAGNOSIS — G8929 Other chronic pain: Secondary | ICD-10-CM | POA: Diagnosis present

## 2020-09-27 DIAGNOSIS — M6281 Muscle weakness (generalized): Secondary | ICD-10-CM | POA: Diagnosis not present

## 2020-09-27 DIAGNOSIS — M5441 Lumbago with sciatica, right side: Secondary | ICD-10-CM | POA: Diagnosis present

## 2020-09-27 DIAGNOSIS — M79662 Pain in left lower leg: Secondary | ICD-10-CM

## 2020-09-27 DIAGNOSIS — M5442 Lumbago with sciatica, left side: Secondary | ICD-10-CM | POA: Diagnosis present

## 2020-09-27 DIAGNOSIS — R209 Unspecified disturbances of skin sensation: Secondary | ICD-10-CM

## 2020-09-27 DIAGNOSIS — M79661 Pain in right lower leg: Secondary | ICD-10-CM | POA: Diagnosis present

## 2020-09-27 NOTE — Therapy (Signed)
[place], [place], [address] Phone: [phone]   Fax:  [phone]  Physical Therapy Treatment  Patient Details  Name: Melinda Stone MRN: [id number] Date of Birth: 1981-01-13 Referring Provider (PT): Marybelle Killings, MD   Encounter Date: 09/27/2020   PT End of Session - 09/27/20 1104     Visit Number 7    Number of Visits 17    Date for PT Re-Evaluation 10/14/20    Authorization Type [place] MCD    Authorization Time Period 09/13/20-10/19/20    Authorization - Visit Number 2    Authorization - Number of Visits 12    PT Start Time 1100    PT Stop Time 1144    PT Time Calculation (min) 44 min             Past Medical History:  Diagnosis Date   Anemia    Diabetes mellitus without complication ([place])    Ovarian cyst    Pneumonia     Past Surgical History:  Procedure Laterality Date   CESAREAN SECTION     CHOLECYSTECTOMY  2004   LUMBAR LAMINECTOMY N/A 05/30/2020   Procedure: Lumbar five Laminectomy,  Bilateral Microdiscectomy, Left Lumbar five -Sacral one Microdiscectomy;  Surgeon: Marybelle Killings, MD;  Location: [facility];  Service: Orthopedics;  Laterality: N/A;   TUBAL LIGATION  10/19/2010    There were no vitals filed for this visit.   Subjective Assessment - 09/27/20 1102     Subjective Sciatica is bothering me today. 9/10 Low back pain and leg pain down to my foot.    Pertinent History Diabetic neuropathy, microdiscectomy on 05/30/2020    Currently in Pain? Yes    Pain Score 9     Pain Location Back    Pain Orientation Lower;Left    Pain Descriptors / Indicators Aching    Pain Type Chronic pain    Pain Radiating Towards LLE    Aggravating Factors  standing, sit long time, lay down long time    Pain Relieving Factors ice                [facility] PT Assessment - 09/27/20 0001       AROM   Lumbar Flexion 70 with ^pain    Lumbar Extension 20 with pain    Lumbar - Right Side Bend  to mid knee no pain    Lumbar - Left Side Bend to mid knee no pain    Lumbar - Right Rotation WFL no pain    Lumbar - Left Rotation WFL no pain      Strength   Right Hip Flexion 4/5    Right Hip ABduction 4-/5    Left Hip Flexion 4/5    Left Hip ABduction 4-/5                           [facility] Adult PT Treatment/Exercise - 09/27/20 0001       Lumbar Exercises: Stretches   Lower Trunk Rotation 10 seconds    Lower Trunk Rotation Limitations 10 reps    Press Ups 10 reps    Press Ups Limitations 2 ets, 1 set to elbows and 1 set extending elbows    Piriformis Stretch 30 seconds;2 reps   right and left   Piriformis Stretch Limitations done sitting and supine    Gastroc Stretch 2 reps;30 seconds;Left;Right      Lumbar Exercises: Supine  Clam Limitations GTB alternating - 2x10    Bridge Limitations 2 x 10    Bridge with clamshell 10 reps    Bridge with [employer] Limitations green    Large Ball Abdominal Isometric 5 seconds;15 reps    Large Ball Abdominal Isometric Limitations hooklying   cues for correct technique     Knee/Hip Exercises: Standing   Heel Raises 10 reps    Heel Raises Limitations heel/toe raises x 10      Knee/Hip Exercises: Sidelying   Hip ABduction 10 reps    Hip ABduction Limitations rt/lt    Clams x 10 each    Other Sidelying Knee/Hip Exercises reverse clam x 10      Knee/Hip Exercises: Prone   Hip Extension Limitations prone alternating hip extension 5 each x 2                      PT Short Term Goals - 09/27/20 1110       PT SHORT TERM GOAL #1   Title Pt will demonstrate understanding and report adherence to early HEP in order to promote independence in the management of her primary impairments.    Baseline pt reports adherance to HEP every other day    Time 4    Period Weeks    Status Achieved      PT SHORT TERM GOAL #2   Title Pt will demonstrate hip strength in all planes of 3+/5 or greater in order to allow for  prolonged standing without concordant pain from weak and painful LE musculature.    Baseline Hip strength in all planes =3/5  6/21: Hip flexion and ext improved to 3+/5, hip abduction remains 3/5                  09/27/20 bilat hip aduction4-/5    Time 4    Status Achieved               PT Long Term Goals - 09/06/20 1252       PT LONG TERM GOAL #1   Title Pt will report ability to stand greater than 1 hour  with <3/10 pain in order to cook in the kitchen without seated rest breaks.    Baseline Unable to stand >35mnutes without increased pain  6/21: no change    Time 8    Period Weeks    Status On-going      PT LONG TERM GOAL #2   Title Pt will report ability to sit greater than 1 hour with <3/10 pain in order to sit down to watch a movie with her children without limitation.    Baseline Unable to sit >20 minutes without increased pain.  6/21: no change    Time 8    Period Weeks    Status On-going      PT LONG TERM GOAL #3   Title Pt will demonstrate WNL lumbar AROM in all planes with <3/10 pain in order to dress herself without limitation due to pain.    Baseline All lumbar AROM is limited and painful 6/21: no change    Time 8    Period Weeks    Status On-going      PT LONG TERM GOAL #4   Title Pt will demonstrate BIL hip strength in all planes of 4+/5 or greater in order to progress to more advanced LE strengthening exercises in the management of her lumbar and LE pain.    Baseline  All hip MMT =3/5  6/21: Hip flexion and ext improved to 3+/5, hip abduction remains 3/5    Time 8    Period Weeks    Status New                   Plan - 09/27/20 1105     Clinical Impression Statement Pt reports compliance with HEP everyother day. Sh reports the exercises make her back and legs achey from the knees down. Today she has increased pain and LLE pain she attributes to sciatica and rates at 9/10. She reports average pain level is always 8/10 or more. Her hip strength  has improved and at least 4-/5 all planes tested. She has met STG#1, #2. She reports improved ability to stand for 45 minutes at a time for cooking and other household activity. She also reports improved tolerance and can sit comfortably for 1 hour wathcing TV and 2 hours to travel long distance in car. She has met LTG# 2 and Progressing toward LTG#1 and #4. Marland Kitchen She notes continued difficulty with dressing but does demonstrate improved Lumbar AROM compared to initial measures. Overall she is making godd progress.    PT Next Visit Plan Check goals,  Core stabilization progression, hip flexibility, sciatic N mobility, balance training, hip strengthening.    PT Home Exercise Plan [email or web address]             Patient will benefit from skilled therapeutic intervention in order to improve the following deficits and impairments:  Abnormal gait, Decreased activity tolerance, Decreased balance, Decreased endurance, Decreased mobility, Decreased range of motion, Hypomobility, Difficulty walking, Pain, Impaired sensation  Visit Diagnosis: Muscle weakness (generalized)  Chronic midline low back pain with bilateral sciatica  Pain in left lower leg  Unspecified disturbances of skin sensation  Pain in right lower leg     Problem List Patient Active Problem List   Diagnosis Date Noted   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   Chronic pain syndrome 06/14/2020   Pharmacologic therapy 06/14/2020   Disorder of skeletal system 06/14/2020   Problems influencing health status 06/14/2020   History of marijuana use 05/17/3141   History of illicit drug use 88/87/5797   Abnormal drug screen (05/25/2020) 06/14/2020   Marijuana use 06/14/2020   Abnormal MRI, lumbar spine (05/16/2020) 06/14/2020   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, L5-S1) 06/14/2020   Lumbar lateral recess stenosis (Left: L4-5, L5-S1) 06/14/2020   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

## 2020-09-27 NOTE — Telephone Encounter (Signed)
Called left pt 1x to call and set an appt with Dr. Ophelia Charter for MRI review after 10/02/20. Will try again later.

## 2020-09-29 ENCOUNTER — Ambulatory Visit: Payer: Medicaid Other | Admitting: Physical Therapy

## 2020-09-29 ENCOUNTER — Other Ambulatory Visit: Payer: Self-pay

## 2020-09-29 ENCOUNTER — Encounter: Payer: Self-pay | Admitting: Physical Therapy

## 2020-09-29 DIAGNOSIS — M79662 Pain in left lower leg: Secondary | ICD-10-CM

## 2020-09-29 DIAGNOSIS — M6281 Muscle weakness (generalized): Secondary | ICD-10-CM | POA: Diagnosis not present

## 2020-09-29 DIAGNOSIS — G8929 Other chronic pain: Secondary | ICD-10-CM

## 2020-09-29 DIAGNOSIS — M79661 Pain in right lower leg: Secondary | ICD-10-CM

## 2020-09-29 DIAGNOSIS — R209 Unspecified disturbances of skin sensation: Secondary | ICD-10-CM

## 2020-09-29 NOTE — Therapy (Signed)
[facility] [address] [place], [place], [address] Phone: [phone]   Fax:  [phone]  Physical Therapy Treatment  Patient Details  Name: Melinda Stone MRN: [id number] Date of Birth: 1980-09-14 Referring Provider (PT): Melinda Manges, MD   Encounter Date: 09/29/2020   PT End of Session - 09/29/20 1045     Visit Number 8    Number of Visits 17    Date for PT Re-Evaluation 10/14/20    Authorization Type  MCD    Authorization Time Period 09/13/20-10/19/20    Authorization - Visit Number 3    Authorization - Number of Visits 12    PT Start Time 1015    PT Stop Time 1100    PT Time Calculation (min) 45 min             Past Medical History:  Diagnosis Date   Anemia    Diabetes mellitus without complication ([facility])    Ovarian cyst    Pneumonia     Past Surgical History:  Procedure Laterality Date   CESAREAN SECTION     CHOLECYSTECTOMY  2004   LUMBAR LAMINECTOMY N/A 05/30/2020   Procedure: Lumbar five Laminectomy,  Bilateral Microdiscectomy, Left Lumbar five -Sacral one Microdiscectomy;  Surgeon: Melinda Manges, MD;  [facility];  Service: Orthopedics;  Laterality: N/A;   TUBAL LIGATION  10/19/2010    There were no vitals filed for this visit.   Subjective Assessment - 09/29/20 1018     Subjective A little stiff but I just woke up. I was good after last visit, just a little sore.    Pertinent History Diabetic neuropathy, microdiscectomy on 05/30/2020    Currently in Pain? Yes    Pain Score 9     Pain Location Back    Pain Orientation Left;Lower    Pain Descriptors / Indicators Aching;Tightness    Pain Type Chronic pain    Pain Radiating Towards Left buttock                               [facility] Adult PT Treatment/Exercise - 09/29/20 0001       Ambulation/Gait   Gait Comments cues for increased knee/ hip flexion  and heel strike with gait.      Lumbar Exercises: Stretches    Lower Trunk Rotation 10 seconds    Lower Trunk Rotation Limitations 10 reps    Press Ups 10 reps    Press Ups Limitations 1 setextending elbows    Piriformis Stretch 30 seconds;2 reps   right and left   Piriformis Stretch Limitations supine    Gastroc Stretch 2 reps;30 seconds;Left;Right      Lumbar Exercises: Aerobic   Nustep 5' L5, UE/LE      Lumbar Exercises: Supine   Clam Limitations GTB alternating - 2x10    Bridge Limitations x 10 with calves on 55 cm stability ball    Large Ball Abdominal Isometric 5 seconds;15 reps    Large Ball Abdominal Isometric Limitations hooklying   cues for correct technique   Other Supine Lumbar Exercises Feet on ball abdominal draw in with hamstring curls x 10      Knee/Hip Exercises: Standing   Heel Raises 10 reps    Heel Raises Limitations heel/toe raises x 10    Other Standing Knee Exercises tandem balance 10-15 sec best      Knee/Hip Exercises: Sidelying   Hip ABduction  15 reps    Hip ABduction Limitations Rt/Lt    Clams x 15 each    Other Sidelying Knee/Hip Exercises reverse x 15 each      Knee/Hip Exercises: Prone   Hip Extension Limitations prone alternating hip extension 5 each x 2      Manual Therapy   Manual therapy comments TPR to left piriformis using tennis ball, then  with passive Hip IR/ER                      PT Short Term Goals - 09/27/20 1110       PT SHORT TERM GOAL #1   Title Pt will demonstrate understanding and report adherence to early HEP in order to promote independence in the management of her primary impairments.    Baseline pt reports adherance to HEP every other day    Time 4    Period Weeks    Status Achieved      PT SHORT TERM GOAL #2   Title Pt will demonstrate hip strength in all planes of 3+/5 or greater in order to allow for prolonged standing without concordant pain from weak and painful LE musculature.    Baseline Hip strength in all planes =3/5  6/21: Hip flexion and ext improved to  3+/5, hip abduction remains 3/5                  09/27/20 bilat hip aduction4-/5    Time 4    Status Achieved               PT Long Term Goals - 09/06/20 1252       PT LONG TERM GOAL #1   Title Pt will report ability to stand greater than 1 hour  with <3/10 pain in order to cook in the kitchen without seated rest breaks.    Baseline Unable to stand >43minutes without increased pain  6/21: no change    Time 8    Period Weeks    Status On-going      PT LONG TERM GOAL #2   Title Pt will report ability to sit greater than 1 hour with <3/10 pain in order to sit down to watch a movie with her children without limitation.    Baseline Unable to sit >20 minutes without increased pain.  6/21: no change    Time 8    Period Weeks    Status On-going      PT LONG TERM GOAL #3   Title Pt will demonstrate WNL lumbar AROM in all planes with <3/10 pain in order to dress herself without limitation due to pain.    Baseline All lumbar AROM is limited and painful 6/21: no change    Time 8    Period Weeks    Status On-going      PT LONG TERM GOAL #4   Title Pt will demonstrate BIL hip strength in all planes of 4+/5 or greater in order to progress to more advanced LE strengthening exercises in the management of her lumbar and LE pain.    Baseline All hip MMT =3/5  6/21: Hip flexion and ext improved to 3+/5, hip abduction remains 3/5    Time 8    Period Weeks    Status New                   Plan - 09/29/20 1020     Clinical Impression Statement Pt reports she was able  to shop at [employer] yesterday with pain average 7/10. Today pain is rated at 9/10 and is attributed to just waking up and being stiff. She reports back pain gets better as she gets moving. Her pain in LLE is more proximal today, into left buttock. She has been using the lumbar extension stretches to manage her radicular pain with success. Progressed hip strength and stabilization and updated HEP. She tolerated  session well with some increased popping of right hip during LTR stretches.    PT Next Visit Plan Check goals,  Core stabilization progression, hip flexibility, sciatic N mobility, balance training, hip strengthening.    PT Home Exercise Plan [id number]             Patient will benefit from skilled therapeutic intervention in order to improve the following deficits and impairments:  Abnormal gait, Decreased activity tolerance, Decreased balance, Decreased endurance, Decreased mobility, Decreased range of motion, Hypomobility, Difficulty walking, Pain, Impaired sensation  Visit Diagnosis: Muscle weakness (generalized)  Chronic midline low back pain with bilateral sciatica  Pain in left lower leg  Unspecified disturbances of skin sensation  Pain in right lower leg     Problem List Patient Active Problem List   Diagnosis Date Noted   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   Chronic pain syndrome 06/14/2020   Pharmacologic therapy 06/14/2020   Disorder of skeletal system 06/14/2020   Problems influencing health status 06/14/2020   History of marijuana use 06/14/2020   History of illicit drug use 06/14/2020   Abnormal drug screen (05/25/2020) 06/14/2020   Marijuana use 06/14/2020   Abnormal MRI, lumbar spine (05/16/2020) 06/14/2020   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, L5-S1) 06/14/2020   Lumbar lateral recess stenosis (Left: L4-5, L5-S1) 06/14/2020   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 [place], [place], [address] Phone: [phone]   Fax:  [phone]  Name: GERARD BONUS MRN: [id number] Date of Birth: [birth year]

## 2020-10-02 ENCOUNTER — Ambulatory Visit
Admission: RE | Admit: 2020-10-02 | Discharge: 2020-10-02 | Disposition: A | Discharge status: Home / Self Care | Payer: Medicaid Other | Source: Ambulatory Visit | Attending: Orthopaedic Surgery | Admitting: Orthopaedic Surgery

## 2020-10-02 DIAGNOSIS — Z9889 Other specified postprocedural states: Secondary | ICD-10-CM

## 2020-10-02 DIAGNOSIS — M545 Low back pain, unspecified: Secondary | ICD-10-CM

## 2020-10-02 MED ORDER — GADOBENATE DIMEGLUMINE 529 MG/ML IV SOLN
18.0000 mL | Freq: Once | INTRAVENOUS | Status: AC | PRN
  Administered 2020-10-02: 18 mL via INTRAVENOUS

## 2020-10-04 ENCOUNTER — Encounter: Payer: Self-pay | Admitting: Physical Therapy

## 2020-10-04 ENCOUNTER — Other Ambulatory Visit: Payer: Self-pay

## 2020-10-04 ENCOUNTER — Ambulatory Visit: Payer: Medicaid Other | Admitting: Physical Therapy

## 2020-10-04 DIAGNOSIS — G8929 Other chronic pain: Secondary | ICD-10-CM

## 2020-10-04 DIAGNOSIS — R209 Unspecified disturbances of skin sensation: Secondary | ICD-10-CM

## 2020-10-04 DIAGNOSIS — M6281 Muscle weakness (generalized): Secondary | ICD-10-CM | POA: Diagnosis not present

## 2020-10-04 DIAGNOSIS — M79662 Pain in left lower leg: Secondary | ICD-10-CM

## 2020-10-04 DIAGNOSIS — M79661 Pain in right lower leg: Secondary | ICD-10-CM

## 2020-10-04 NOTE — Patient Instructions (Signed)

## 2020-10-04 NOTE — Therapy (Signed)
[facility] [address] [place], [place], [address] Phone: [phone]   Fax:  [phone]  Physical Therapy Treatment  Patient Details  Name: Melinda Stone MRN: [id number] Date of Birth: 07-26-1980 Referring Provider (PT): [name], MD   Encounter Date: 10/04/2020   PT End of Session - 10/04/20 1016     Visit Number 9    Number of Visits 17    Date for PT Re-Evaluation 10/14/20    Authorization Type [place] MCD    Authorization Time Period 09/13/20-10/19/20    Authorization - Visit Number 4    Authorization - Number of Visits 12    PT Start Time 1010    PT Stop Time 1055    PT Time Calculation (min) 45 min             Past Medical History:  Diagnosis Date   Anemia    Diabetes mellitus without complication ([facility])    Ovarian cyst    Pneumonia     Past Surgical History:  Procedure Laterality Date   CESAREAN SECTION     CHOLECYSTECTOMY  2004   LUMBAR LAMINECTOMY N/A 05/30/2020   Procedure: Lumbar five Laminectomy,  Bilateral Microdiscectomy, Left Lumbar five -Sacral one Microdiscectomy;  Surgeon: [name], MD;  Location: [facility];  Service: Orthopedics;  Laterality: N/A;   TUBAL LIGATION  10/19/2010    There were no vitals filed for this visit.   Subjective Assessment - 10/04/20 1015     Subjective I am hurting. I have been hurting more for 4 days. I cannot get any relief. My pain is a 10/10 but I am not going to give up. I almost went to the hospital because I hurt so  bad.    Currently in Pain? Yes    Pain Score 10-Worst pain ever    Pain Location Back    Pain Orientation Lower;Mid    Pain Descriptors / Indicators Aching;Tightness    Pain Type Chronic pain    Pain Radiating Towards anterior thighs    Aggravating Factors  standing, sitting, prolonged positions    Pain Relieving Factors ice                               [facility] Adult PT Treatment/Exercise - 10/04/20 0001        Lumbar Exercises: Stretches   Active Hamstring Stretch 2 reps;20 seconds    Active Hamstring Stretch Limitations supine    Single Knee to Chest Stretch 3 reps;10 seconds    Lower Trunk Rotation 10 seconds    Lower Trunk Rotation Limitations 10 reps    Piriformis Stretch 30 seconds;2 reps   right and left   Piriformis Stretch Limitations supine    Gastroc Stretch 2 reps;30 seconds;Left;Right    Gastroc Stretch Limitations also slant board x 60 sec    Other Lumbar Stretch Exercise physioball rollouts seated      Lumbar Exercises: Aerobic   Nustep 5' L5, UE/LE      Modalities   Modalities Moist Heat;Electrical Stimulation      Moist Heat Therapy   Number Minutes Moist Heat 15 Minutes    Moist Heat Location Lumbar Spine      Electrical Stimulation   Electrical Stimulation Location Lumbar    Electrical Stimulation Action IFC x 15 min    Electrical Stimulation Parameters to tolerance    Electrical Stimulation Goals Pain  PT Education - 10/04/20 1048     Education Details Tens unti purchase information    Person(s) Educated Patient    Methods Explanation;Handout    Comprehension Verbalized understanding              PT Short Term Goals - 09/27/20 1110       PT SHORT TERM GOAL #1   Title Pt will demonstrate understanding and report adherence to early HEP in order to promote independence in the management of her primary impairments.    Baseline pt reports adherance to HEP every other day    Time 4    Period Weeks    Status Achieved      PT SHORT TERM GOAL #2   Title Pt will demonstrate hip strength in all planes of 3+/5 or greater in order to allow for prolonged standing without concordant pain from weak and painful LE musculature.    Baseline Hip strength in all planes =3/5  6/21: Hip flexion and ext improved to 3+/5, hip abduction remains 3/5                  09/27/20 bilat hip aduction4-/5    Time 4    Status Achieved                PT Long Term Goals - 09/06/20 1252       PT LONG TERM GOAL #1   Title Pt will report ability to stand greater than 1 hour  with <3/10 pain in order to cook in the kitchen without seated rest breaks.    Baseline Unable to stand >61minutes without increased pain  6/21: no change    Time 8    Period Weeks    Status On-going      PT LONG TERM GOAL #2   Title Pt will report ability to sit greater than 1 hour with <3/10 pain in order to sit down to watch a movie with her children without limitation.    Baseline Unable to sit >20 minutes without increased pain.  6/21: no change    Time 8    Period Weeks    Status On-going      PT LONG TERM GOAL #3   Title Pt will demonstrate WNL lumbar AROM in all planes with <3/10 pain in order to dress herself without limitation due to pain.    Baseline All lumbar AROM is limited and painful 6/21: no change    Time 8    Period Weeks    Status On-going      PT LONG TERM GOAL #4   Title Pt will demonstrate BIL hip strength in all planes of 4+/5 or greater in order to progress to more advanced LE strengthening exercises in the management of her lumbar and LE pain.    Baseline All hip MMT =3/5  6/21: Hip flexion and ext improved to 3+/5, hip abduction remains 3/5    Time 8    Period Weeks    Status New                   Plan - 10/04/20 1044     Clinical Impression Statement Pt arrives reporting increased pain for the last several days and she has been unable to get relief with stretches and heat/ice applications. Performed gentle stretches in clinic and did a trial of electrical stimulation for pain relief today. She reported relief of pain during IFC and was given information on TENS and options  for purchase in community. Pt reported decreased pain after TENS.    PT Next Visit Plan assess response to IFC< Check goals,  Core stabilization progression, hip flexibility, sciatic N mobility, balance training, hip strengthening.    PT Home  Exercise Plan [id number]    Consulted and Agree with Plan of Care Patient             Patient will benefit from skilled therapeutic intervention in order to improve the following deficits and impairments:  Abnormal gait, Decreased activity tolerance, Decreased balance, Decreased endurance, Decreased mobility, Decreased range of motion, Hypomobility, Difficulty walking, Pain, Impaired sensation  Visit Diagnosis: Muscle weakness (generalized)  Chronic midline low back pain with bilateral sciatica  Pain in left lower leg  Unspecified disturbances of skin sensation  Pain in right lower leg     Problem List Patient Active Problem List   Diagnosis Date Noted   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   Chronic pain syndrome 06/14/2020   Pharmacologic therapy 06/14/2020   Disorder of skeletal system 06/14/2020   Problems influencing health status 06/14/2020   History of marijuana use 06/14/2020   History of illicit drug use 06/14/2020   Abnormal drug screen (05/25/2020) 06/14/2020   Marijuana use 06/14/2020   Abnormal MRI, lumbar spine (05/16/2020) 06/14/2020   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, L5-S1) 06/14/2020   Lumbar lateral recess stenosis (Left: L4-5, L5-S1) 06/14/2020   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 [place], [place], [address] Phone: [phone]   Fax:  [phone]  Name: ABRA LINGENFELTER MRN: [id number] Date of Birth: 09-29-1980

## 2020-10-06 ENCOUNTER — Ambulatory Visit: Payer: Medicaid Other | Admitting: Physical Therapy

## 2020-10-06 ENCOUNTER — Encounter: Payer: Self-pay | Admitting: Physical Therapy

## 2020-10-06 ENCOUNTER — Other Ambulatory Visit: Payer: Self-pay

## 2020-10-06 DIAGNOSIS — M6281 Muscle weakness (generalized): Secondary | ICD-10-CM

## 2020-10-06 DIAGNOSIS — M79662 Pain in left lower leg: Secondary | ICD-10-CM

## 2020-10-06 DIAGNOSIS — G8929 Other chronic pain: Secondary | ICD-10-CM

## 2020-10-06 DIAGNOSIS — M79661 Pain in right lower leg: Secondary | ICD-10-CM

## 2020-10-06 DIAGNOSIS — R209 Unspecified disturbances of skin sensation: Secondary | ICD-10-CM

## 2020-10-06 NOTE — Therapy (Signed)
[facility] [address] [place], [place], [address] Phone: [phone]   Fax:  [phone]  Physical Therapy Treatment  Patient Details  Name: Melinda Stone MRN: [id number] Date of Birth: February 14, 1981 Referring Provider (PT): [name], MD   Encounter Date: 10/06/2020   PT End of Session - 10/06/20 0942     Visit Number 10    Number of Visits 17    Date for PT Re-Evaluation 10/14/20    Authorization Type [place] [facility]    Authorization Time Period 09/13/20-10/19/20    Authorization - Visit Number 5    Authorization - Number of Visits 12    PT Start Time 0937    PT Stop Time 1030    PT Time Calculation (min) 53 min             Past Medical History:  Diagnosis Date   Anemia    Diabetes mellitus without complication (HCC)    Ovarian cyst    Pneumonia     Past Surgical History:  Procedure Laterality Date   CESAREAN SECTION     CHOLECYSTECTOMY  2004   LUMBAR LAMINECTOMY N/A 05/30/2020   Procedure: Lumbar five Laminectomy,  Bilateral Microdiscectomy, Left Lumbar five -Sacral one Microdiscectomy;  Surgeon: [name], MD;  Location: [facility];  Service: Orthopedics;  Laterality: N/A;   TUBAL LIGATION  10/19/2010    There were no vitals filed for this visit.   Subjective Assessment - 10/06/20 0939     Subjective The estim helped until I got to the car and then the pain was back. Today I am a 9/10 in my back. I had a nerve test done when I left here.    Currently in Pain? Yes    Pain Score 9     Pain Location Back    Pain Orientation Lower;Mid    Pain Descriptors / Indicators Aching;Tightness    Pain Type Chronic pain                               [facility] Adult PT Treatment/Exercise - 10/06/20 0001       Lumbar Exercises: Stretches   Passive Hamstring Stretch 3 reps;30 seconds    Prone on Elbows Stretch Limitations Prone lying over pillows x 5 minutes, increased pain in back and legs so  discontinued    Gastroc Stretch 2 reps;30 seconds;Left;Right    Other Lumbar Stretch Exercise physioball rollouts seated, also laterals      Lumbar Exercises: Aerobic   Nustep 5' L5, UE/LE      Moist Heat Therapy   Number Minutes Moist Heat 15 Minutes    Moist Heat Location Lumbar Spine      Electrical Stimulation   Electrical Stimulation Location Lumbar , seated    Electrical Stimulation Action IFC x 15 min    Electrical Stimulation Parameters 14 mA    Electrical Stimulation Goals Pain                      PT Short Term Goals - 09/27/20 1110       PT SHORT TERM GOAL #1   Title Pt will demonstrate understanding and report adherence to early HEP in order to promote independence in the management of her primary impairments.    Baseline pt reports adherance to HEP every other day    Time 4    Period Weeks  Status Achieved      PT SHORT TERM GOAL #2   Title Pt will demonstrate hip strength in all planes of 3+/5 or greater in order to allow for prolonged standing without concordant pain from weak and painful LE musculature.    Baseline Hip strength in all planes =3/5  6/21: Hip flexion and ext improved to 3+/5, hip abduction remains 3/5                  09/27/20 bilat hip aduction4-/5    Time 4    Status Achieved               PT Long Term Goals - 09/06/20 1252       PT LONG TERM GOAL #1   Title Pt will report ability to stand greater than 1 hour  with <3/10 pain in order to cook in the kitchen without seated rest breaks.    Baseline Unable to stand >32minutes without increased pain  6/21: no change    Time 8    Period Weeks    Status On-going      PT LONG TERM GOAL #2   Title Pt will report ability to sit greater than 1 hour with <3/10 pain in order to sit down to watch a movie with her children without limitation.    Baseline Unable to sit >20 minutes without increased pain.  6/21: no change    Time 8    Period Weeks    Status On-going      PT  LONG TERM GOAL #3   Title Pt will demonstrate WNL lumbar AROM in all planes with <3/10 pain in order to dress herself without limitation due to pain.    Baseline All lumbar AROM is limited and painful 6/21: no change    Time 8    Period Weeks    Status On-going      PT LONG TERM GOAL #4   Title Pt will demonstrate BIL hip strength in all planes of 4+/5 or greater in order to progress to more advanced LE strengthening exercises in the management of her lumbar and LE pain.    Baseline All hip MMT =3/5  6/21: Hip flexion and ext improved to 3+/5, hip abduction remains 3/5    Time 8    Period Weeks    Status New                   Plan - 10/06/20 0949     Clinical Impression Statement Pt reports very short term relief with estim. She had a nerve test 2 days ago and reports her leg pain has been increased since then. She now has pain in lumbar and down back of both legs. She is waiting on results of her nerve test. Attempted standing lumbar extension and prone lying over pillows which caused increased  back and leg pain. SHe feels some releif with seated lumbar flexion stretching with physioball. Trial of seated sciatic tensioner and passive h/s stretching without relief. IFC applied again in sitting per pt request. Afterward she reported decreased back pain however co change in leg pain.    PT Next Visit Plan assess response to IFC< Check goals,  Core stabilization progression, hip flexibility, sciatic N mobility, balance training, hip strengthening.    PT Home Exercise Plan [id number]             Patient will benefit from skilled therapeutic intervention in order to improve the following deficits and  impairments:  Abnormal gait, Decreased activity tolerance, Decreased balance, Decreased endurance, Decreased mobility, Decreased range of motion, Hypomobility, Difficulty walking, Pain, Impaired sensation  Visit Diagnosis: Muscle weakness (generalized)  Pain in left lower  leg  Unspecified disturbances of skin sensation  Pain in right lower leg  Chronic midline low back pain with bilateral sciatica     Problem List Patient Active Problem List   Diagnosis Date Noted   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   Chronic pain syndrome 06/14/2020   Pharmacologic therapy 06/14/2020   Disorder of skeletal system 06/14/2020   Problems influencing health status 06/14/2020   History of marijuana use 06/14/2020   History of illicit drug use 06/14/2020   Abnormal drug screen (05/25/2020) 06/14/2020   Marijuana use 06/14/2020   Abnormal MRI, lumbar spine (05/16/2020) 06/14/2020   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, L5-S1) 06/14/2020   Lumbar lateral recess stenosis (Left: L4-5, L5-S1) 06/14/2020   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) 06/14/2020   S/P lumbar microdiscectomy 06/07/2020   Lumbar disc herniation 05/10/2020   GI bleed 12/01/2019   Muscle spasm 11/06/2019   Sciatica 11/06/2019   Low back pain 02/04/2019   Type 2 diabetes mellitus with diabetic polyneuropathy, without long-term current use of insulin (HCC) 12/03/2018   Type 2 diabetes mellitus with hyperglycemia, without long-term current use of insulin (HCC) 05/15/2018    Sherrie Mustache, PTA 10/06/2020, 10:31 AM  [facility] [address] [place], [place], [address] Phone: [phone]   Fax:  [phone]  Name: Melinda Stone MRN: [id number] Date of Birth: [birth year]

## 2020-10-10 ENCOUNTER — Other Ambulatory Visit: Payer: Self-pay

## 2020-10-10 ENCOUNTER — Ambulatory Visit (INDEPENDENT_AMBULATORY_CARE_PROVIDER_SITE_OTHER): Payer: Medicaid Other | Admitting: Internal Medicine

## 2020-10-10 VITALS — BP 110/82 | HR 100 | Ht 69.0 in | Wt 193.6 lb

## 2020-10-10 DIAGNOSIS — E1165 Type 2 diabetes mellitus with hyperglycemia: Secondary | ICD-10-CM

## 2020-10-10 LAB — POCT GLYCOSYLATED HEMOGLOBIN (HGB A1C): Hemoglobin A1C: 7.3 % — AB (ref 4.0–5.6)

## 2020-10-10 MED ORDER — EMPAGLIFLOZIN 10 MG PO TABS: 10.0000 mg | ORAL_TABLET | Freq: Every day | ORAL | 6 refills | Status: DC

## 2020-10-10 NOTE — Patient Instructions (Addendum)
-   Glipizide 10 mg, TWO  tablets before Breakfast and  TWO Tablets Before supper  - Start Jardiance 10 mg, ONE tablet every morning    HOW TO TREAT LOW BLOOD SUGARS (Blood sugar LESS THAN 70 MG/DL) Please follow the RULE OF 15 for the treatment of hypoglycemia treatment (when your (blood sugars are less than 70 mg/dL)   STEP 1: Take 15 grams of carbohydrates when your blood sugar is low, which includes:  3-4 GLUCOSE TABS  OR 3-4 OZ OF JUICE OR REGULAR SODA OR ONE TUBE OF GLUCOSE GEL    STEP 2: RECHECK blood sugar in 15 MINUTES STEP 3: If your blood sugar is still low at the 15 minute recheck --> then, go back to STEP 1 and treat AGAIN with another 15 grams of carbohydrates.

## 2020-10-10 NOTE — Progress Notes (Signed)
Name: Melinda Stone  Age/ Sex: 40 y.o., female   MRN/ DOB: [id number], 1980-05-18     PCP: Horald Pollen, MD   Reason for Endocrinology Evaluation: Type 2 Diabetes Mellitus  Initial Endocrine Consultative Visit: 12/03/2018    PATIENT IDENTIFIER: Melinda Stone is a 40 y.o. female with a past medical history of T2DM. The patient has followed with Endocrinology clinic since 12/03/2018 for consultative assistance with management of her diabetes.  DIABETIC HISTORY:  Melinda Stone was diagnosed with DMat age 81. Metformin had caused GI issues and hypoglycemia. Her hemoglobin A1c has ranged from 7.0% in 04/2018, peaking at 7.8% in 10/2018  On her initial visit to our clinic she had an A1c of 7.8%. She was on Jardiance, she had a prescription for Trulicity and Jardiance were too costly for her. We added glipizide .   Attempted to prescribe Trulicity in 06/812 but by 09/2020 she has not received it.   Works at [employer] with kids 55 and 63 yrs of age    SUBJECTIVE:   During the last visit (07/07/2020): A1c 8.0 % . Continued Glipizide      Today (10/10/2020): Melinda Stone is here for a follow up on diabetes.  She checks her blood sugars 1 times in the past month. The patient has not had hypoglycemic episodes since the last clinic visit.    She is having joint aches and pains   She is going to have an injection of steroid in the knee   Trulicity not covered by [facility]   HOME DIABETES REGIMEN:  Glipizide 10 mg BID- she 2 takes it three times a day ?   METER DOWNLOAD SUMMARY: Did not bring      DIABETIC COMPLICATIONS: Microvascular complications:   Left eye retinopathy  Denies: CKD, neuropathy  Last eye exam: Completed 11/2019   Macrovascular complications:    Denies: CAD, PVD, CVA   HISTORY:  Past Medical History:  Past Medical History:  Diagnosis Date   Anemia    Diabetes mellitus without complication ([place])    Ovarian  cyst    Pneumonia    Past Surgical History:  Past Surgical History:  Procedure Laterality Date   CESAREAN SECTION     CHOLECYSTECTOMY  2004   LUMBAR LAMINECTOMY N/A 05/30/2020   Procedure: Lumbar five Laminectomy,  Bilateral Microdiscectomy, Left Lumbar five -Sacral one Microdiscectomy;  Surgeon: Marybelle Killings, MD;  Location: [facility];  Service: Orthopedics;  Laterality: N/A;   TUBAL LIGATION  10/19/2010   Social History:  reports that she has quit smoking. She has never used smokeless tobacco. She reports current alcohol use. She reports current drug use. Drug: Marijuana. Family History:  Family History  Problem Relation Age of Onset   Diabetes Mother    Hypertension Mother    Stroke Mother      HOME MEDICATIONS: Allergies as of 10/10/2020   No Known Allergies      Medication List        Accurate as of October 10, 2020  9:57 AM. If you have any questions, ask your nurse or doctor.          Accu-Chek Guide Me w/Device Kit Use as instructed to check blood sugar daily   Accu-Chek Guide test strip Generic drug: glucose blood Use as instructed to check blood sugar 2 times daily   ARTIFICIAL TEARS OP Place 1 drop into both eyes daily as needed (Dry eyes).   DULoxetine 30  MG capsule Commonly known as: Cymbalta Take 1 capsule (30 mg total) by mouth daily.   FeroSul 325 (65 FE) MG tablet Generic drug: ferrous sulfate Take 325 mg by mouth daily with breakfast.   ferrous gluconate 324 MG tablet Commonly known as: FERGON Take 1 tablet (324 mg total) by mouth daily with breakfast.   gabapentin 600 MG tablet Commonly known as: NEURONTIN Take 1 tablet (600 mg total) by mouth 2 (two) times daily AND 2 tablets (1,200 mg total) at bedtime.   glipiZIDE 10 MG tablet Commonly known as: GLUCOTROL Take 2 tablets (20 mg total) by mouth 2 (two) times daily before a meal.   methocarbamol 500 MG tablet Commonly known as: Robaxin Take 1 tablet (500 mg total) by mouth every 6 (six)  hours as needed for muscle spasms.   oxyCODONE-acetaminophen 5-325 MG tablet Commonly known as: PERCOCET/ROXICET Take 1-2 tablets by mouth every 6 (six) hours as needed for severe pain.   traMADol 50 MG tablet Commonly known as: ULTRAM Take 1 tablet (50 mg total) by mouth every 6 (six) hours as needed.   Trulicity 5.64 PP/2.9JJ Sopn Generic drug: Dulaglutide Inject 0.75 mg into the skin once a week.         OBJECTIVE:   Vital Signs: BP 110/82 (BP Location: Left Arm, Patient Position: Sitting, Cuff Size: Normal)   Pulse 100   Ht 5' 9"  (1.753 m)   Wt 193 lb 9.6 oz (87.8 kg)   SpO2 99%   BMI 28.59 kg/m   Wt Readings from Last 3 Encounters:  10/10/20 193 lb 9.6 oz (87.8 kg)  09/13/20 194 lb (88 kg)  08/16/20 190 lb (86.2 kg)     Exam: General: Pt appears drowsy, c/o arthralgia   Lungs: Clear with good BS bilat with no rales, rhonchi, or wheezes  Heart: RRR   Extremities: No  pretibial edema.   Neuro: MS is good with appropriate affect, pt is alert and Ox3    DM foot exam: 10/10/2020 The skin of the feet is intact without sores or ulcerations. The pedal pulses are 2+ on right and 2+ on left. The sensation is absent to a screening 5.07, 10 gram monofilament bilaterally     DATA REVIEWED:  Lab Results  Component Value Date   HGBA1C 7.3 (A) 10/10/2020   HGBA1C 8.0 (A) 07/07/2020   HGBA1C 6.5 (A) 04/28/2020   Lab Results  Component Value Date   MICROALBUR 2.3 (H) 07/07/2020   LDLCALC 23 04/28/2020   CREATININE 0.57 05/25/2020   Lab Results  Component Value Date   MICRALBCREAT 2.2 07/07/2020     Lab Results  Component Value Date   CHOL 96 (Stone) 04/28/2020   HDL 55 04/28/2020   LDLCALC 23 04/28/2020   TRIG 97 04/28/2020   CHOLHDL 1.7 04/28/2020    Results for Melinda Stone, Melinda Stone (MRN [id number]) as of 07/08/2020 10:23  Ref. Range 07/07/2020 10:29  TSH Latest Ref Range: 0.35 - 4.50 uIU/mL 1.05  T4,Free(Direct) Latest Ref Range: 0.60 - 1.60 ng/dL 0.89       ASSESSMENT / PLAN / RECOMMENDATIONS:   1) Type 2 Diabetes Mellitus, Optimally controlled, With Neuropathic complications - Most recent A1c of 7.3  %. Goal A1c < 7.0 %.    - A1c improving  - No glucose data  - She tells me she takes Glipizide three times a day ? But somehow she has not run out of the prescription any sooner , she was advised to take it  ONLY TWICE a day  - She did not start Trulicity, it doesn't;t seem she picked it up, will start SGLT-2 inhibitors, cautioned against genital infections   MEDICATIONS:  Glipizide 10 mg 2 tabs BID  Start Jardiance 10 mg daily   EDUCATION / INSTRUCTIONS: BG monitoring instructions: Patient is instructed to check her blood sugars 2 times a day, fasting and supper time. Call [facility] if: BG persistently < 70  I reviewed the Rule of 15 for the treatment of hypoglycemia in detail with the patient. Literature supplied.    F/U in 6 months    Signed electronically by: Mack Guise, MD  [facility]  [facility] [address] [place], [place] [address] Phone: [phone] FAX: [phone]   CC: Horald Pollen, MD [place] [address] Phone: [phone]  Fax: [phone]  Return to Endocrinology clinic as below: Future Appointments  Date Time Provider [place]  10/11/2020 10:15 AM Jarome Matin [facility]  10/11/2020  1:15 PM Marybelle Killings, MD [facility] None  10/17/2020 10:15 AM Edwin Cap, PT [facility]  10/26/2020 10:00 AM Horald Pollen, MD [facility] None  11/07/2020  9:00 AM Penumalli, Earlean Polka, MD [facility] None

## 2020-10-11 ENCOUNTER — Ambulatory Visit: Payer: Medicaid Other

## 2020-10-11 ENCOUNTER — Ambulatory Visit (INDEPENDENT_AMBULATORY_CARE_PROVIDER_SITE_OTHER): Payer: Medicaid Other | Admitting: Orthopaedic Surgery

## 2020-10-11 ENCOUNTER — Telehealth: Payer: Self-pay | Admitting: Orthopaedic Surgery

## 2020-10-11 ENCOUNTER — Telehealth: Payer: Self-pay | Admitting: Pharmacy Technician

## 2020-10-11 ENCOUNTER — Encounter: Payer: Self-pay | Admitting: Orthopaedic Surgery

## 2020-10-11 ENCOUNTER — Encounter: Payer: Self-pay | Admitting: Internal Medicine

## 2020-10-11 VITALS — BP 117/77 | HR 97 | Ht 69.0 in | Wt 193.0 lb

## 2020-10-11 DIAGNOSIS — M5442 Lumbago with sciatica, left side: Secondary | ICD-10-CM

## 2020-10-11 DIAGNOSIS — M5126 Other intervertebral disc displacement, lumbar region: Secondary | ICD-10-CM

## 2020-10-11 DIAGNOSIS — M6281 Muscle weakness (generalized): Secondary | ICD-10-CM | POA: Diagnosis not present

## 2020-10-11 DIAGNOSIS — M79662 Pain in left lower leg: Secondary | ICD-10-CM

## 2020-10-11 DIAGNOSIS — G8929 Other chronic pain: Secondary | ICD-10-CM

## 2020-10-11 DIAGNOSIS — M79661 Pain in right lower leg: Secondary | ICD-10-CM

## 2020-10-11 MED ORDER — TRAMADOL HCL 50 MG PO TABS: 50.0000 mg | ORAL_TABLET | Freq: Three times a day (TID) | ORAL | 0 refills | Status: DC | PRN

## 2020-10-11 NOTE — Telephone Encounter (Signed)
Please advise 

## 2020-10-11 NOTE — Telephone Encounter (Signed)
Patient called asked if Dr. Ophelia Charter would prescribe a stronger medication to help control the pain shes' experiencing until she goes back to pain management October 24, 2020. Patient said the Tramadol is not strong enough. The number to contact patient is [phone]

## 2020-10-11 NOTE — Telephone Encounter (Signed)
Called to pharmacy 

## 2020-10-11 NOTE — Progress Notes (Addendum)
Office Visit Note   Patient: Melinda Stone           Date of Birth: 1981/03/10           MRN: [id number] Visit Date: 10/11/2020              Requested by: Georgina Quint, MD [address] [place],  [place] [address] PCP: No primary care provider on file.   Assessment & Plan: Visit Diagnoses:  1. Recurrent herniation of lumbar disc     Plan: Patient needs recurrent microdiscectomy for recurrent disc herniation with disc occupying greater than half her canal space. After surgery and March she did well for more than 2 months until she re -herniated the disc at L4-5 she has severe lateral recess stenosis and moderate central stenosis fortunately does not have neurogenic claudication due to the complete laminectomy done at L4-5 level on her March surgery.  Plan would be left side approach L4-5 microdiscectomy for recurrent HNP overnight stay in the hospital.  Risk surgery discussed including risk of dural tear, recurrent disc herniation, possible need for fusion.  Questions elicited and answered she understands request to proceed. Follow-Up Instructions: No follow-ups on file.   Orders:  No orders of the defined types were placed in this encounter.  No orders of the defined types were placed in this encounter.     Procedures: No procedures performed   Clinical Data: No additional findings.   Subjective: Chief Complaint  Patient presents with   Lower Back - Pain    MRI lumbar review    HPI 40 year old female returns post microdiscectomy L5-S1 bilaterally and left at L5-S1 on 05/30/2020.  For surgery she could not stand or walk he was in a wheelchair.  She can make it about 6 steps and had massive disc herniation occupying greater than 80% of the canal at the L4-5 level with severe spinal stenosis.  Moderately large protrusion on the left at L5-S1 was also present.  Both levels were surgically treated with the upper level L4-5 surgically treated with bilateral  microdiscectomy.  Postoperatively she was walking a mile and a half 1 month after surgery.  Starting in early June she had recurrence of symptoms and now she is having significant problems walking and standing.  She worked at [employer] for 17 years cannot stand currently is bent over at the waist and has severe back and leg pain.  She states she had electrical tests which showed sciatic nerve compression.  New MRI scan is obtained and report is below which shows a very large disc herniation left paracentral with spinal stenosis and severe lateral recess and foraminal stenosis.  Fortunately her disc herniation is not massive as it was before but she still has greater than 50% canal occupied by the disc.  She has had tramadol pain medication muscle relaxants Depo-Medrol injection, prednisone Dosepak and no relief.  No bowel or bladder associated symptoms.  Review of Systems positive for diabetes on oral medication.  History of GI bleed in the distant past.  Lumbar surgery March 2022 as listed above.  No current bowel or bladder symptoms.  Negative for cardiac problems.  Neurologic negative except for leg numbness and weakness.   Objective: Vital Signs: BP 117/77   Pulse 97   Ht 5\' 9"  (1.753 m)   Wt 193 lb (87.5 kg)   BMI 28.50 kg/m   Physical Exam Constitutional:      Appearance: She is well-developed.  HENT:  Head: Normocephalic.     Right Ear: External ear normal.     Left Ear: External ear normal. There is no impacted cerumen.  Eyes:     Pupils: Pupils are equal, round, and reactive to light.  Neck:     Thyroid: No thyromegaly.     Trachea: No tracheal deviation.  Cardiovascular:     Rate and Rhythm: Normal rate.  Pulmonary:     Effort: Pulmonary effort is normal.  Abdominal:     Palpations: Abdomen is soft.  Musculoskeletal:     Cervical back: No rigidity.  Skin:    General: Skin is warm and dry.  Neurological:     Mental Status: She is alert and oriented to person, place,  and time.  Psychiatric:        Behavior: Behavior normal.    Ortho Exam positive straight leg raising at 50 degrees on the left 80 degrees on the right.  She has left anterior tib gastrocsoleus weakness cannot toe and heel walk.  She cannot get into a straight position keeps her hips in a flexed position.  Lumbar incision is well-healed that L4-S1.  Bilateral sciatic notch tenderness worse on the left than right.  Decreased sensation left foot dorsum and plantar surface.  Specialty Comments:  No specialty comments available.  Imaging: Narrative & Impression  CLINICAL DATA:  Initial evaluation for low back pain with radiation into the lower extremities, prior surgery on 05/30/2020. Evaluate for recurrent HNP.   EXAM: MRI LUMBAR SPINE WITHOUT AND WITH CONTRAST   TECHNIQUE: Multiplanar and multiecho pulse sequences of the lumbar spine were obtained without and with intravenous contrast.   CONTRAST:  68mL MULTIHANCE GADOBENATE DIMEGLUMINE 529 MG/ML IV SOLN   COMPARISON:  MRI from 05/15/2020.   FINDINGS: Segmentation: Standard. Lowest well-formed disc space labeled the L5-S1 level.   Alignment: Slight focal kyphotic angulation at the L4-5 level. Alignment otherwise normal with preservation of the normal lumbar lordosis.   Vertebrae: Vertebral body height maintained without acute or interval fracture. Bone marrow signal intensity within normal limits. No discrete or worrisome osseous lesions. Mild reactive endplate change present about the L4-5 interspace. No other abnormal marrow edema or enhancement.   Conus medullaris and cauda equina: Conus extends to the L1 level. Conus and cauda equina appear normal.   Paraspinal and other soft tissues: Postoperative changes from prior laminectomy with micro discectomy at L4-5. No adverse features. Paraspinous soft tissues demonstrate no other acute finding. Visualized visceral structures unremarkable.   Disc levels:   L1-2:   Unremarkable.   L2-3:  Unremarkable.   L3-4:  Unremarkable.   L4-5: Degenerative intervertebral disc space narrowing with disc desiccation and diffuse disc bulge. Postoperative changes from prior decompressive laminectomy with micro discectomy. There is a residual and/or recurrent left subarticular disc protrusion extending into the left lateral recess (series 9, image 29). Resultant moderate canal with severe left lateral recess stenosis. Mild bilateral L4 foraminal narrowing.   L5-S1: Degenerative intervertebral disc space narrowing with disc desiccation and mild disc bulge. Sequelae of prior left hemi laminectomy with micro discectomy. Postoperative granulation tissue partially surrounds the descending left S1 nerve root. Small residual and/or recurrent left subarticular to foraminal disc protrusion (series 8, image 12). Disc material closely approximates both the left L5 and descending S1 nerve roots without frank impingement. No significant canal or lateral recess stenosis. Mild bilateral L5 foraminal stenosis.   IMPRESSION: 1. Postoperative changes from prior decompressive laminectomy with micro discectomy at L4-5. Residual and/or recurrent  left subarticular disc protrusion at this level with resultant moderate canal and severe left lateral recess stenosis. 2. Sequelae of prior left hemi laminectomy with micro discectomy at L5-S1 with small residual and/or recurrent left subarticular to foraminal disc protrusion as above.     Electronically Signed   By: Rise Mu M.D.   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, L5-S1) 06/14/2020   Lumbar lateral recess stenosis (Left: L4-5, L5-S1) 06/14/2020   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) 06/14/2020   S/P lumbar microdiscectomy 06/07/2020   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   GI bleed 12/01/2019   Muscle spasm 11/06/2019   Sciatica 11/06/2019   Low back pain 02/04/2019   Type 2 diabetes mellitus with diabetic polyneuropathy, without long-term current use of insulin (HCC) 12/03/2018   Type 2 diabetes mellitus with hyperglycemia, without long-term current use of insulin (HCC) 05/15/2018   Past Medical History:  Diagnosis Date   Anemia    Diabetes mellitus without complication (HCC)    Ovarian cyst    Pneumonia     Family History  Problem Relation Age of Onset   Diabetes Mother    Hypertension Mother    Stroke Mother     Past Surgical History:  Procedure Laterality Date   CESAREAN SECTION     CHOLECYSTECTOMY  2004   LUMBAR LAMINECTOMY N/A 05/30/2020   Procedure: Lumbar five Laminectomy,  Bilateral Microdiscectomy, Left Lumbar five -Sacral one Microdiscectomy;  Surgeon: [name], MD;  Location: [facility];  Service: Orthopedics;  Laterality: N/A;   TUBAL LIGATION  10/19/2010   Social History   Occupational History   Not on file  Tobacco Use   Smoking status: Former   Smokeless tobacco: Never   Tobacco comments:    black and milds per paitent 1-2/day  Vaping Use   Vaping Use: Never used  Substance and Sexual Activity   Alcohol use: Yes    Comment: socially    Drug use: Yes    Types: Marijuana    Comment: per paitent stopped smoking marijuana at the begining of March 2022   Sexual activity: Not on file

## 2020-10-11 NOTE — Therapy (Signed)
[place], [place], [address] Phone: [phone]   Fax:  [phone]  Physical Therapy Treatment/Discharge   Patient Details  Name: SENYA HINZMAN MRN: [id number] Date of Birth: 24-Apr-1980 Referring Provider (PT): Marybelle Killings, MD   Encounter Date: 10/11/2020   PT End of Session - 10/11/20 1015     Visit Number 11    Number of Visits 17    Date for PT Re-Evaluation 10/14/20    Authorization Type [place] [facility]    Authorization Time Period 09/13/20-10/19/20    Authorization - Visit Number 6    Authorization - Number of Visits 12    PT Start Time 9201    PT Stop Time 1050    PT Time Calculation (min) 35 min    Activity Tolerance Patient limited by pain    Behavior During Therapy [facility] for tasks assessed/performed             Past Medical History:  Diagnosis Date   Anemia    Diabetes mellitus without complication ([place])    Ovarian cyst    Pneumonia     Past Surgical History:  Procedure Laterality Date   CESAREAN SECTION     CHOLECYSTECTOMY  2004   LUMBAR LAMINECTOMY N/A 05/30/2020   Procedure: Lumbar five Laminectomy,  Bilateral Microdiscectomy, Left Lumbar five -Sacral one Microdiscectomy;  Surgeon: Marybelle Killings, MD;  Location: [facility];  Service: Orthopedics;  Laterality: N/A;   TUBAL LIGATION  10/19/2010    There were no vitals filed for this visit.   Subjective Assessment - 10/11/20 1015     Subjective Patient reports she is hurting rated as 9/10. Mostly in her knees. She did receive injections in her knees this morning. She feels that she moving in the opposite direction for the past week meaning she is having more pain and feeling weaker, like she can't stand as long as she was previoulsy able and isn't able to complete some of her exercises because her ROM has decreased. She reports subjective overall improvement of 10%. She has f/u with Dr. Lorin Mercy later today.    Pertinent History Diabetic  neuropathy, microdiscectomy on 05/30/2020    How long can you sit comfortably? 30 minutes    How long can you stand comfortably? "I can not stand, it has been rough"    How long can you walk comfortably? "I can make it from my [employer] to the grocery store, but I have to get a riding buggy"    Patient Stated Goals Pt would like to return to standing and walking without limitation.    Currently in Pain? Yes    Pain Score 9     Pain Location Back    Pain Orientation Lower    Pain Descriptors / Indicators Aching    Pain Type Chronic pain    Pain Radiating Towards lateral thighs    Pain Onset More than a month ago    Pain Frequency Constant    Aggravating Factors  standing, walking    Pain Relieving Factors ice    Multiple Pain Sites Yes    Pain Score 9    Pain Location Knee    Pain Orientation Right;Left    Pain Descriptors / Indicators Aching;Sharp    Pain Type Chronic pain    Pain Onset More than a month ago    Pain Frequency Constant    Aggravating Factors  walking, standing    Pain Relieving Factors nothing  [facility] PT Assessment - 10/11/20 0001       Assessment   Medical Diagnosis S/P lumbar microdiscectomy (Z98.890)      AROM   Overall AROM Comments pain with all lumbar AROM    Lumbar Flexion 50    Lumbar Extension 20    Lumbar - Right Side Bend to mid knee    Lumbar - Left Side Bend to mid knee    Lumbar - Right Rotation 50% limitation    Lumbar - Left Rotation 50% limitation      Strength   Right Hip Flexion 4/5    Right Hip Extension 3+/5    Right Hip ABduction 3+/5    Left Hip Flexion 4/5    Left Hip Extension 3+/5    Left Hip ABduction 3+/5    Right Knee Flexion 5/5    Right Knee Extension 4/5    Left Knee Flexion 5/5    Left Knee Extension 4/5    Right Ankle Dorsiflexion 5/5    Right Ankle Plantar Flexion 5/5    Left Ankle Dorsiflexion 5/5    Left Ankle Plantar Flexion 5/5                           [facility] Adult PT  Treatment/Exercise - 10/11/20 0001       Self-Care   Self-Care Other Self-Care Comments    Other Self-Care Comments  see patient education      Lumbar Exercises: Stretches   Lower Trunk Rotation 60 seconds      Lumbar Exercises: Supine   Other Supine Lumbar Exercises diaphragmatic breathing 10 reps    Other Supine Lumbar Exercises 90/90 supine hold 5 minutes                    PT Education - 10/11/20 1054     Education Details Education on re-assessment findings. D/C education. Review HEP. Recommended to f/u with referring provider.    Person(s) Educated Patient    Methods Explanation;Demonstration    Comprehension Verbalized understanding;Returned demonstration              PT Short Term Goals - 09/27/20 1110       PT SHORT TERM GOAL #1   Title Pt will demonstrate understanding and report adherence to early HEP in order to promote independence in the management of her primary impairments.    Baseline pt reports adherance to HEP every other day    Time 4    Period Weeks    Status Achieved      PT SHORT TERM GOAL #2   Title Pt will demonstrate hip strength in all planes of 3+/5 or greater in order to allow for prolonged standing without concordant pain from weak and painful LE musculature.    Baseline Hip strength in all planes =3/5  6/21: Hip flexion and ext improved to 3+/5, hip abduction remains 3/5                  09/27/20 bilat hip aduction4-/5    Time 4    Status Achieved               PT Long Term Goals - 10/11/20 1032       PT LONG TERM GOAL #1   Title Pt will report ability to stand greater than 1 hour  with <3/10 pain in order to cook in the kitchen without seated rest breaks.    Baseline  9/10 pain    Time 8    Period Weeks    Status Not Met      PT LONG TERM GOAL #2   Title Pt will report ability to sit greater than 1 hour with <3/10 pain in order to sit down to watch a movie with her children without limitation.    Baseline 9/10  pain    Time 8    Period Weeks    Status Not Met      PT LONG TERM GOAL #3   Title Pt will demonstrate WNL lumbar AROM in all planes with <3/10 pain in order to dress herself without limitation due to pain.    Baseline remains limited and painful    Time 8    Period Weeks    Status Not Met      PT LONG TERM GOAL #4   Title Pt will demonstrate BIL hip strength in all planes of 4+/5 or greater in order to progress to more advanced LE strengthening exercises in the management of her lumbar and LE pain.    Baseline see flowsheet    Time 8    Period Weeks    Status Not Met                   Plan - 10/11/20 1045     Clinical Impression Statement Ms. Mcbeth has attended 11 PT sessions with limited overall functional progress and continued reports of high levels of back and BLE pain. Her lumbar AROM remains limited and painful in all planes. Her tolerance to standing and walking activity remains very limited due to pain. She continues to demonstrate hip and knee weakness bilaterally. Due to her lack of functional progress she is appropriate for discharge at this time with recommendation to f/u with Dr. Lorin Mercy for further assessment.    PT Next Visit Plan --    PT Home Exercise Plan [id number]    Consulted and Agree with Plan of Care Patient             Patient will benefit from skilled therapeutic intervention in order to improve the following deficits and impairments:  Abnormal gait, Decreased activity tolerance, Decreased balance, Decreased endurance, Decreased mobility, Decreased range of motion, Hypomobility, Difficulty walking, Pain, Impaired sensation  Visit Diagnosis: Muscle weakness (generalized)  Pain in left lower leg  Pain in right lower leg  Chronic midline low back pain with bilateral sciatica     Problem List Patient Active Problem List   Diagnosis Date Noted   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   Chronic pain syndrome 06/14/2020    Pharmacologic therapy 06/14/2020   Disorder of skeletal system 06/14/2020   Problems influencing health status 06/14/2020   History of marijuana use 96/28/3662   History of illicit drug use 94/76/5465   Abnormal drug screen (05/25/2020) 06/14/2020   Marijuana use 06/14/2020   Abnormal MRI, lumbar spine (05/16/2020) 06/14/2020   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, L5-S1) 06/14/2020   Lumbar lateral recess stenosis (Left: L4-5, L5-S1) 06/14/2020   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) 06/14/2020   S/P lumbar microdiscectomy 06/07/2020   Lumbar disc herniation 05/10/2020   GI bleed 12/01/2019   Muscle spasm 11/06/2019   Sciatica 11/06/2019   Low back pain 02/04/2019   Type 2 diabetes mellitus with diabetic polyneuropathy, without long-term current use of insulin ([place]) 12/03/2018   Type 2 diabetes mellitus with hyperglycemia, without long-term current use of insulin ([place]) 05/15/2018  PHYSICAL THERAPY DISCHARGE SUMMARY  Visits from Start of Care: 11  Current functional level related to goals / functional outcomes: See goals above   Remaining deficits: See impression above   Education / Equipment: See education above    Patient agrees to discharge. Patient goals were partially met. Patient is being discharged due to lack of progress. Gwendolyn Grant, PT, DPT, ATC 10/11/20 10:56 AM   [facility] [address] [place], [place], [address] Phone: [phone]   Fax:  [phone]  Name: LORICE LAFAVE MRN: [id number] Date of Birth: [birth year]

## 2020-10-11 NOTE — Telephone Encounter (Addendum)
Patient Advocate Encounter   Received notification from [facility] that prior authorization for JARDIANCE is required.   Form faxed to [facility] Medicaid 10/12/2020  Status is APPROVED Through 10/07/2021  [id number]    [facility] will continue to follow   Jeannette How, CPhT Patient Advocate [facility] Phone: [phone] Fax:  [phone]

## 2020-10-25 ENCOUNTER — Other Ambulatory Visit: Payer: Self-pay

## 2020-10-25 ENCOUNTER — Encounter (HOSPITAL_COMMUNITY): Payer: Self-pay | Admitting: Emergency Medicine

## 2020-10-25 ENCOUNTER — Emergency Department (HOSPITAL_COMMUNITY)
Admission: EM | Admit: 2020-10-25 | Discharge: 2020-10-25 | Disposition: A | Discharge status: Home / Self Care | Payer: Medicaid Other | Attending: Emergency Medicine | Admitting: Emergency Medicine

## 2020-10-25 DIAGNOSIS — S339XXA Sprain of unspecified parts of lumbar spine and pelvis, initial encounter: Secondary | ICD-10-CM | POA: Diagnosis not present

## 2020-10-25 DIAGNOSIS — Y999 Unspecified external cause status: Secondary | ICD-10-CM | POA: Diagnosis not present

## 2020-10-25 DIAGNOSIS — S3992XA Unspecified injury of lower back, initial encounter: Secondary | ICD-10-CM | POA: Diagnosis present

## 2020-10-25 DIAGNOSIS — E119 Type 2 diabetes mellitus without complications: Secondary | ICD-10-CM | POA: Insufficient documentation

## 2020-10-25 DIAGNOSIS — Z87891 Personal history of nicotine dependence: Secondary | ICD-10-CM | POA: Insufficient documentation

## 2020-10-25 DIAGNOSIS — X58XXXA Exposure to other specified factors, initial encounter: Secondary | ICD-10-CM | POA: Insufficient documentation

## 2020-10-25 DIAGNOSIS — Y929 Unspecified place or not applicable: Secondary | ICD-10-CM | POA: Insufficient documentation

## 2020-10-25 DIAGNOSIS — Z7984 Long term (current) use of oral hypoglycemic drugs: Secondary | ICD-10-CM | POA: Diagnosis not present

## 2020-10-25 DIAGNOSIS — Y939 Activity, unspecified: Secondary | ICD-10-CM | POA: Diagnosis not present

## 2020-10-25 DIAGNOSIS — S39012A Strain of muscle, fascia and tendon of lower back, initial encounter: Secondary | ICD-10-CM

## 2020-10-25 HISTORY — DX: Other chronic pain: G89.29

## 2020-10-25 MED ORDER — KETOROLAC TROMETHAMINE 30 MG/ML IJ SOLN
30.0000 mg | Freq: Once | INTRAMUSCULAR | Status: AC
  Administered 2020-10-25: 30 mg via INTRAMUSCULAR
  Filled 2020-10-25: qty 1

## 2020-10-25 MED ORDER — METHOCARBAMOL 500 MG PO TABS: 500.0000 mg | ORAL_TABLET | Freq: Two times a day (BID) | ORAL | 0 refills | Status: DC

## 2020-10-25 MED ORDER — METHOCARBAMOL 500 MG PO TABS
500.0000 mg | ORAL_TABLET | Freq: Once | ORAL | Status: AC
  Administered 2020-10-25: 500 mg via ORAL
  Filled 2020-10-25: qty 1

## 2020-10-25 MED ORDER — HYDROCODONE-ACETAMINOPHEN 5-325 MG PO TABS: 1.0000 | ORAL_TABLET | ORAL | 0 refills | Status: DC | PRN

## 2020-10-25 MED ORDER — HYDROMORPHONE HCL 1 MG/ML IJ SOLN
1.0000 mg | Freq: Once | INTRAMUSCULAR | Status: AC
Start: 2020-10-25 — End: 2020-10-25
  Administered 2020-10-25: 1 mg via INTRAMUSCULAR
  Filled 2020-10-25: qty 1

## 2020-10-25 MED ORDER — HYDROCODONE-ACETAMINOPHEN 5-325 MG PO TABS: 1.0000 | ORAL_TABLET | Freq: Four times a day (QID) | ORAL | 0 refills | Status: DC | PRN

## 2020-10-25 NOTE — Telephone Encounter (Signed)
Pharmacy changed

## 2020-10-25 NOTE — Discharge Instructions (Addendum)
Take the medication as needed. Follow-up with orthopedist and primary care doctor. Return to the ER for worsening pain, losing control of your bowels or bladder, numbness to your groin area, chest pain or shortness of breath.

## 2020-10-25 NOTE — ED Triage Notes (Signed)
Per pt, states she is having lower back pain due to a "disc" issue-she is waiting for her insurance to approved her for surgery

## 2020-10-25 NOTE — ED Provider Notes (Signed)
[facility] DEPT Provider Note   CSN: [id number] Arrival date & time: 10/25/20  1008     History Chief Complaint  Patient presents with   Back Pain    Melinda Stone is a 40 y.o. female with a past medical history of diabetes, status post lumbar microdiscectomy with chronic back pain presenting to the ED for back pain.  Symptoms have been gradually worsening over the past few months.  She is evaluated by Dr. Lorin Mercy earlier in the month with an outpatient MRI ordered.  Plan is for repeat surgery but she is unable to figure out her insurance situation.  She has been taking tramadol but ran out and this previously minimally helped her with her pain.  No injuries or falls states that this is the pain she typically experiences with her prior surgical effects.  She denies any numbness or weakness, loss of bowel or bladder function, saddle anesthesia, fever, chest pain, vomiting.  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   Chronic pain syndrome 06/14/2020   Pharmacologic therapy 06/14/2020   Disorder of skeletal system 06/14/2020   Problems influencing health status 06/14/2020   History of marijuana use 94/85/4627   History of illicit drug use 03/50/0938   Abnormal drug screen (05/25/2020) 06/14/2020   Marijuana use 06/14/2020   Abnormal MRI, lumbar spine (05/16/2020) 06/14/2020   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, L5-S1) 06/14/2020   Lumbar lateral recess stenosis (Left: L4-5, L5-S1) 06/14/2020   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) 06/14/2020   S/P lumbar microdiscectomy 06/07/2020   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   GI bleed 12/01/2019   Muscle spasm 11/06/2019   Sciatica  11/06/2019   Low back pain 02/04/2019   Type 2 diabetes mellitus with diabetic polyneuropathy, without long-term current use of insulin ([place]) 12/03/2018   Type 2 diabetes mellitus with hyperglycemia, without long-term current use of insulin ([place]) 05/15/2018    Past Surgical History:  Procedure Laterality Date   CESAREAN SECTION     CHOLECYSTECTOMY  2004   LUMBAR LAMINECTOMY N/A 05/30/2020   Procedure: Lumbar five Laminectomy,  Bilateral Microdiscectomy, Left Lumbar five -Sacral one Microdiscectomy;  Surgeon: Marybelle Killings, MD;  Location: [facility];  Service: Orthopedics;  Laterality: N/A;   TUBAL LIGATION  10/19/2010     OB History   No obstetric history on file.     Family History  Problem Relation Age of Onset   Diabetes Mother    Hypertension Mother    Stroke Mother     Social History   Tobacco Use   Smoking status: Former   Smokeless tobacco: Never   Tobacco comments:    black and milds per paitent 1-2/day  Vaping Use   Vaping Use: Never used  Substance Use Topics   Alcohol use: Yes    Comment: socially    Drug use: Yes    Types: Marijuana    Comment: per paitent stopped smoking marijuana at the begining of March 2022    Home Medications Prior to Admission medications   Medication Sig Start Date End Date Taking? Authorizing Provider  Blood Glucose Monitoring Suppl (ACCU-CHEK GUIDE ME) w/Device KIT Use as instructed to  check blood sugar daily 07/13/20   Shamleffer, Melanie Crazier, MD  Carboxymethylcellulose Sodium (ARTIFICIAL TEARS OP) Place 1 drop into both eyes daily as needed (Dry eyes).    [provider]  DULoxetine (CYMBALTA) 30 MG capsule Take 1 capsule (30 mg total) by mouth daily. 03/21/20   Hilts, Legrand Como, MD  empagliflozin (JARDIANCE) 10 MG TABS tablet Take 1 tablet (10 mg total) by mouth daily. 10/10/20   Shamleffer, Melanie Crazier, MD  FEROSUL 325 (65 Fe) MG tablet Take 325 mg by mouth daily with breakfast. 03/03/20   [provider]   ferrous gluconate (FERGON) 324 MG tablet Take 1 tablet (324 mg total) by mouth daily with breakfast. 01/30/19   Jacelyn Pi, Lilia Argue, MD  gabapentin (NEURONTIN) 600 MG tablet Take 1 tablet (600 mg total) by mouth 2 (two) times daily AND 2 tablets (1,200 mg total) at bedtime. 04/28/20 01/05/21  Horald Pollen, MD  glipiZIDE (GLUCOTROL) 10 MG tablet Take 2 tablets (20 mg total) by mouth 2 (two) times daily before a meal. 07/07/20   Shamleffer, Melanie Crazier, MD  glucose blood (ACCU-CHEK GUIDE) test strip Use as instructed to check blood sugar 2 times daily 07/13/20   Shamleffer, Melanie Crazier, MD  HYDROcodone-acetaminophen (NORCO/VICODIN) 5-325 MG tablet Take 1 tablet by mouth every 6 (six) hours as needed. 10/25/20  Yes Lavinia Mcneely, PA-C  methocarbamol (ROBAXIN) 500 MG tablet Take 1 tablet (500 mg total) by mouth 2 (two) times daily. 10/25/20  Yes Shloima Clinch, PA-C  oxyCODONE-acetaminophen (PERCOCET/ROXICET) 5-325 MG tablet Take 1-2 tablets by mouth every 6 (six) hours as needed for severe pain. 05/30/20   Lanae Crumbly, PA-C  traMADol (ULTRAM) 50 MG tablet Take 1 tablet (50 mg total) by mouth 3 (three) times daily as needed. 10/11/20   Marybelle Killings, MD    Allergies    Patient has no known allergies.  Review of Systems   Review of Systems  Constitutional:  Negative for chills and fever.  Musculoskeletal:  Positive for back pain and myalgias.  Neurological:  Negative for weakness and numbness.   Physical Exam Updated Vital Signs BP 110/76 (BP Location: Right Arm)   Pulse (!) 114   Temp 98.8 F (37.1 C) (Oral)   Resp 18   LMP 10/25/2020   SpO2 100%   Physical Exam Vitals and nursing note reviewed.  Constitutional:      General: She is not in acute distress.    Appearance: She is well-developed. She is not diaphoretic.  HENT:     Head: Normocephalic and atraumatic.  Eyes:     General: No scleral icterus.    Conjunctiva/sclera: Conjunctivae normal.  Cardiovascular:      Rate and Rhythm: Normal rate and regular rhythm.     Heart sounds: Normal heart sounds.  Pulmonary:     Effort: Pulmonary effort is normal. No respiratory distress.     Breath sounds: Normal breath sounds.  Musculoskeletal:     Cervical back: Normal range of motion.       Back:     Comments: No midline spinal tenderness present in lumbar, thoracic or cervical spine. No step-off palpated. No visible bruising, edema or temperature change noted. No objective signs of numbness present. No saddle anesthesia. 2+ DP pulses bilaterally. Sensation intact to light touch. Strength 5/5 in bilateral lower extremities.  Skin:    Findings: No rash.  Neurological:     Mental Status: She is alert.    ED Results / Procedures /  Treatments   Labs (all labs ordered are listed, but only abnormal results are displayed) Labs Reviewed - No data to display  EKG None  Radiology No results found.  Procedures Procedures   Medications Ordered in ED Medications  HYDROmorphone (DILAUDID) injection 1 mg (has no administration in time range)  ketorolac (TORADOL) 30 MG/ML injection 30 mg (has no administration in time range)  methocarbamol (ROBAXIN) tablet 500 mg (has no administration in time range)    ED Course  I have reviewed the triage vital signs and the nursing notes.  Pertinent labs & imaging results that were available during my care of the patient were reviewed by me and considered in my medical decision making (see chart for details).    MDM Rules/Calculators/A&P                           40 year old female presenting to the ED for back pain.  She is status post lumbar microdiscectomy earlier in the year by Dr. Lorin Mercy.  She has been followed by him in the clinic.  MRI done less than 1 month ago shows recurrent left disc protrusion with canal stenosis.  Plan was to schedule surgery but she needs to figure out her insurance situation first.  She does not have any pain medication at home.  This is  similar to the pain she typically experiences.  No injuries or falls.  No red flag symptoms on my exam.  She is ambulatory here.  Neurovascularly intact.  No midline tenderness.  Will treat pain.  We will have her continue follow-up with Ortho and PCP. Patient denies any concerning symptoms suggestive of cauda equina requiring urgent imaging at this time such as loss of sensation in the lower extremities, lower extremity weakness, loss of bowel or bladder control, saddle anesthesia, urinary retention, fever/chills, IVDU. Exam demonstrated no  weakness on exam today. No preceding injury or trauma to suggest acute fracture. Doubt AAA as cause of patient's back pain as patient lacks major risk factors, had no abdominal TTP, and has symmetric and intact distal pulses. Patient given strict return precautions for any symptoms indicating worsening neurologic function in the lower extremities.    Patient is hemodynamically stable, in NAD, and able to ambulate in the ED. Evaluation does not show pathology that would require ongoing emergent intervention or inpatient treatment. I explained the diagnosis to the patient. Pain has been managed and has no complaints prior to discharge. Patient is comfortable with above plan and is stable for discharge at this time. All questions were answered prior to disposition. Strict return precautions for returning to the ED were discussed. Encouraged follow up with PCP.   Prior to providing a prescription for a controlled substance, I independently reviewed the patient's recent prescription history on the [place]. The patient had no recent or regular prescriptions and was deemed appropriate for a brief, less than 3 day prescription of narcotic for acute analgesia.  An After Visit Summary was printed and given to the patient.   Portions of this note were generated with [employer]. Dictation errors may occur despite best  attempts at proofreading.  Final Clinical Impression(s) / ED Diagnoses Final diagnoses:  Strain of lumbar region, initial encounter    Rx / DC Orders ED Discharge Orders          Ordered    methocarbamol (ROBAXIN) 500 MG tablet  2 times daily  10/25/20 1115    HYDROcodone-acetaminophen (NORCO/VICODIN) 5-325 MG tablet  Every 6 hours PRN        10/25/20 1115             Delia Heady, PA-C 10/25/20 1137    Milton Ferguson, MD 10/30/20 [phone]

## 2020-10-26 ENCOUNTER — Encounter: Payer: Self-pay | Admitting: Emergency Medicine

## 2020-10-26 ENCOUNTER — Ambulatory Visit (INDEPENDENT_AMBULATORY_CARE_PROVIDER_SITE_OTHER): Payer: Medicaid Other | Admitting: Emergency Medicine

## 2020-10-26 VITALS — BP 122/70 | HR 117 | Ht 69.0 in | Wt 192.0 lb

## 2020-10-26 DIAGNOSIS — M961 Postlaminectomy syndrome, not elsewhere classified: Secondary | ICD-10-CM

## 2020-10-26 DIAGNOSIS — Z9889 Other specified postprocedural states: Secondary | ICD-10-CM

## 2020-10-26 DIAGNOSIS — E1165 Type 2 diabetes mellitus with hyperglycemia: Secondary | ICD-10-CM

## 2020-10-26 DIAGNOSIS — M48061 Spinal stenosis, lumbar region without neurogenic claudication: Secondary | ICD-10-CM | POA: Diagnosis not present

## 2020-10-26 DIAGNOSIS — G894 Chronic pain syndrome: Secondary | ICD-10-CM

## 2020-10-26 DIAGNOSIS — M5126 Other intervertebral disc displacement, lumbar region: Secondary | ICD-10-CM | POA: Diagnosis not present

## 2020-10-26 LAB — POCT GLYCOSYLATED HEMOGLOBIN (HGB A1C): Hemoglobin A1C: 6.9 % — AB (ref 4.0–5.6)

## 2020-10-26 NOTE — Progress Notes (Signed)
Melinda Stone 40 y.o.   Chief Complaint  Patient presents with   Diabetes    6 month check up.   Back Pain    Pt states that she has severe back pain     HISTORY OF PRESENT ILLNESS: This is a 40 y.o. female 10-monthfollow-up of chronic medical problems. #1 diabetes.  Sees endocrinologist on a regular basis, last visit last month.  Just started Trulicity.  Also taking glipizide twice a day and was also just started on Jardiance 10 mg daily. #2 chronic lumbar pain status post surgery.  Continues to have persistent pain.  Recent lumbar MRI still shows persistent spinal stenosis.  Had to go to the emergency department yesterday for pain control. Assessment and plan from emergency department visit as follows: 40year old female presenting to the ED for back pain.  She is status post lumbar microdiscectomy earlier in the year by Dr. YLorin Stone  She has been followed by him in the clinic.  MRI done less than 1 month ago shows recurrent left disc protrusion with canal stenosis.  Plan was to schedule surgery but she needs to figure out her insurance situation first.  She does not have any pain medication at home.  This is similar to the pain she typically experiences.  No injuries or falls.  No red flag symptoms on my exam.  She is ambulatory here.  Neurovascularly intact.  No midline tenderness.  Will treat pain.  We will have her continue follow-up with Ortho and PCP. Patient denies any concerning symptoms suggestive of cauda equina requiring urgent imaging at this time such as loss of sensation in the lower extremities, lower extremity weakness, loss of bowel or bladder control, saddle anesthesia, urinary retention, fever/chills, IVDU. Exam demonstrated no  weakness on exam today. No preceding injury or trauma to suggest acute fracture. Doubt AAA as cause of patient's back pain as patient lacks major risk factors, had no abdominal TTP, and has symmetric and intact distal pulses. Patient given strict  return precautions for any symptoms indicating worsening neurologic function in the lower extremities.       Patient is hemodynamically stable, in NAD, and able to ambulate in the ED. Evaluation does not show pathology that would require ongoing emergent intervention or inpatient treatment. I explained the diagnosis to the patient. Pain has been managed and has no complaints prior to discharge. Patient is comfortable with above plan and is stable for discharge at this time. All questions were answered prior to disposition. Strict return precautions for returning to the ED were discussed. Encouraged follow up with PCP.    Prior to providing a prescription for a controlled substance, I independently reviewed the patient's recent prescription history on the [facility] The patient had no recent or regular prescriptions and was deemed appropriate for a brief, less than 3 day prescription of narcotic for acute analgesia. Lab Results  Component Value Date   HGBA1C 7.3 (A) 10/10/2020    HPI   Prior to Admission medications   Medication Sig Start Date End Date Taking? Authorizing Provider  Blood Glucose Monitoring Suppl (ACCU-CHEK GUIDE ME) w/Device KIT Use as instructed to check blood sugar daily 07/13/20  Yes Shamleffer, IMelanie Crazier MD  Carboxymethylcellulose Sodium (ARTIFICIAL TEARS OP) Place 1 drop into both eyes daily as needed (Dry eyes).   Yes [provider]  DULoxetine (CYMBALTA) 30 MG capsule Take 1 capsule (30 mg total) by mouth daily. 03/21/20  Yes Hilts, MLegrand Como MD  empagliflozin (JARDIANCE) 10 MG TABS tablet Take 1 tablet (10 mg total) by mouth daily. 10/10/20  Yes Shamleffer, Melanie Crazier, MD  FEROSUL 325 (65 Fe) MG tablet Take 325 mg by mouth daily with breakfast. 03/03/20  Yes [provider]  ferrous gluconate (FERGON) 324 MG tablet Take 1 tablet (324 mg total) by mouth daily with breakfast. 01/30/19  Yes Jacelyn Pi,  Lilia Argue, MD  gabapentin (NEURONTIN) 600 MG tablet Take 1 tablet (600 mg total) by mouth 2 (two) times daily AND 2 tablets (1,200 mg total) at bedtime. 04/28/20 01/05/21 Yes Catera Hankins, Ines Bloomer, MD  glipiZIDE (GLUCOTROL) 10 MG tablet Take 2 tablets (20 mg total) by mouth 2 (two) times daily before a meal. 07/07/20  Yes Shamleffer, Melanie Crazier, MD  glucose blood (ACCU-CHEK GUIDE) test strip Use as instructed to check blood sugar 2 times daily 07/13/20  Yes Shamleffer, Melanie Crazier, MD  HYDROcodone-acetaminophen (NORCO/VICODIN) 5-325 MG tablet Take 1 tablet by mouth every 4 (four) hours as needed. 10/25/20  Yes Caryl Ada K, PA-C  methocarbamol (ROBAXIN) 500 MG tablet Take 1 tablet (500 mg total) by mouth 2 (two) times daily. 10/25/20  Yes Khatri, Hina, PA-C  oxyCODONE-acetaminophen (PERCOCET/ROXICET) 5-325 MG tablet Take 1-2 tablets by mouth every 6 (six) hours as needed for severe pain. 05/30/20  Yes Lanae Crumbly, PA-C  traMADol (ULTRAM) 50 MG tablet Take 1 tablet (50 mg total) by mouth 3 (three) times daily as needed. 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, L5-S1) 06/14/2020   Lumbar lateral recess stenosis (Left: L4-5, L5-S1) 06/14/2020   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) 06/14/2020   S/P lumbar microdiscectomy 06/07/2020   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   GI bleed 12/01/2019   Muscle spasm 11/06/2019   Sciatica 11/06/2019    Low back pain 02/04/2019   Type 2 diabetes mellitus with diabetic polyneuropathy, without long-term current use of insulin ([place]) 12/03/2018   Type 2 diabetes mellitus with hyperglycemia, without long-term current use of insulin ([place]) 05/15/2018    Past Medical History:  Diagnosis Date   Anemia    Chronic back pain    Diabetes mellitus without complication ([place])    Ovarian cyst    Pneumonia     Past Surgical History:  Procedure Laterality Date   CESAREAN SECTION     CHOLECYSTECTOMY  2004   LUMBAR LAMINECTOMY N/A 05/30/2020   Procedure: Lumbar five Laminectomy,  Bilateral Microdiscectomy, Left Lumbar five -Sacral one Microdiscectomy;  Surgeon: Marybelle Killings, MD;  Location: [facility];  Service: Orthopedics;  Laterality: N/A;   TUBAL LIGATION  10/19/2010    Social History   Socioeconomic History   Marital status: Single    Spouse name: Not on file   Number of children: Not on file   Years of education: Not on file   Highest education level: Not on file  Occupational History   Not on file  Tobacco Use   Smoking status: Former  Smokeless tobacco: Never   Tobacco comments:    black and milds per paitent 1-2/day  Vaping Use   Vaping Use: Never used  Substance and Sexual Activity   Alcohol use: Yes    Comment: socially    Drug use: Yes    Types: Marijuana    Comment: per paitent stopped smoking marijuana at the begining of March 2022   Sexual activity: Not on file  Other Topics Concern   Not on file  Social History Narrative   Not on file   Social Determinants of Health   Financial Resource Strain: Not on file  Food Insecurity: Not on file  Transportation Needs: Not on file  Physical Activity: Not on file  Stress: Not on file  Social Connections: Not on file  Intimate Partner Violence: Not on file    Family History  Problem Relation Age of Onset   Diabetes Mother    Hypertension Mother    Stroke Mother      Review of Systems  Constitutional: Negative.   Negative for chills and fever.  HENT:  Negative for congestion and sore throat.   Respiratory: Negative.  Negative for cough and shortness of breath.   Cardiovascular:  Negative for chest pain and palpitations.  Gastrointestinal:  Negative for abdominal pain, diarrhea, nausea and vomiting.  Genitourinary: Negative.   Musculoskeletal:  Positive for back pain.  Skin: Negative.  Negative for rash.  Neurological:  Negative for dizziness and headaches.  All other systems reviewed and are negative.  Vitals:   10/26/20 0952  BP: 122/70  Pulse: (!) 117  SpO2: 99%    Physical Exam Vitals reviewed.  Constitutional:      Appearance: Normal appearance.  HENT:     Head: Normocephalic.  Eyes:     Extraocular Movements: Extraocular movements intact.     Pupils: Pupils are equal, round, and reactive to light.  Cardiovascular:     Rate and Rhythm: Regular rhythm. Tachycardia present.     Pulses: Normal pulses.     Heart sounds: Normal heart sounds.  Pulmonary:     Effort: Pulmonary effort is normal.     Breath sounds: Normal breath sounds.  Musculoskeletal:     Cervical back: Normal range of motion.  Skin:    General: Skin is warm and dry.     Capillary Refill: Capillary refill takes less than 2 seconds.  Neurological:     General: No focal deficit present.     Mental Status: She is alert and oriented to person, place, and time.  Psychiatric:        Mood and Affect: Mood normal.        Behavior: Behavior normal.   Results for orders placed or performed in visit on 10/26/20 (from the past 24 hour(s))  POCT glycosylated hemoglobin (Hb A1C)     Status: Abnormal   Collection Time: 10/26/20 10:43 AM  Result Value Ref Range   Hemoglobin A1C 6.9 (A) 4.0 - 5.6 %   HbA1c POC (<> result, manual entry)     HbA1c, POC (prediabetic range)     HbA1c, POC (controlled diabetic range)       ASSESSMENT & PLAN: Type 2 diabetes mellitus with hyperglycemia, without long-term current use of  insulin (HCC) Much improved diabetes with hemoglobin A1c of 6.9.  Continue glipizide twice a day, Trulicity weekly, and Jardiance 10 mg daily. Diet and nutrition discussed. Continue to follow-up with endocrinologist. Follow-up in 6 months.  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  Plan is to schedule second surgery.  Will follow-up with orthopedic surgeon as scheduled.  Waiting on Medicaid approval for surgery.  Elisha was seen today for diabetes and back pain.  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 Eating healthy foods in the right amounts, at about the same times every day, can help you: Control your blood glucose. Lower your risk of heart disease. Improve your blood pressure. Reach or maintain a healthy weight. What can affect my meal plan? Every person with diabetes is different, and each person has different needs for a meal plan. Your health care provider may recommend that you work with a dietitian to make a meal plan that is best for you. Your meal plan may vary depending on factors such as: The calories you need. The medicines you take. Your weight. Your blood glucose, blood pressure, and cholesterol levels. Your activity level. Other health conditions you have, such as heart or kidney disease. How do carbohydrates affect  me? Carbohydrates, also called carbs, affect your blood glucose level more than any other type of food. Eating carbs naturally raises the amount of glucose in your blood. Carb counting is a method for keeping track of how many carbs you eat. Counting carbs is important to keep your blood glucose at a healthy level,especially if you use insulin or take certain oral diabetes medicines. It is important to know how many carbs you can safely have in each meal. This is different for every person. Your dietitian can help you calculate how manycarbs you should have at each meal and for each snack. How does alcohol affect me? Alcohol can cause a sudden decrease in blood glucose (hypoglycemia), especially if you use insulin or take certain oral diabetes medicines. Hypoglycemia can be a life-threatening condition. Symptoms of hypoglycemia, such as sleepiness, dizziness, and confusion, are similar to symptoms of having too much alcohol. Do not drink alcohol if: Your health care provider tells you not to drink. You are pregnant, may be pregnant, or are planning to become pregnant. If you drink alcohol: Do not drink on an empty stomach. Limit how much you use to: 0-1 drink a day for women. 0-2 drinks a day for men. Be aware of how much alcohol is in your drink. In the [place] one drink equals one 12 oz bottle of beer (355 mL), one 5 oz glass of wine (148 mL), or one 1 oz glass of hard liquor (44 mL). Keep yourself hydrated with water, diet soda, or unsweetened iced tea. Keep in mind that regular soda, juice, and other mixers may contain a lot of sugar and must be counted as carbs. What are tips for following this plan?  Reading food labels Start by checking the serving size on the "Nutrition Facts" label of packaged foods and drinks. The amount of calories, carbs, fats, and other nutrients listed on the label is based on one serving of  the item. Many items contain more than one serving per package. Check the  total grams (g) of carbs in one serving. You can calculate the number of servings of carbs in one serving by dividing the total carbs by 15. For example, if a food has 30 g of total carbs per serving, it would be equal to 2 servings of carbs. Check the number of grams (g) of saturated fats and trans fats in one serving. Choose foods that have a low amount or none of these fats. Check the number of milligrams (mg) of salt (sodium) in one serving. Most people should limit total sodium intake to less than 2,300 mg per day. Always check the nutrition information of foods labeled as "low-fat" or "nonfat." These foods may be higher in added sugar or refined carbs and should be avoided. Talk to your dietitian to identify your daily goals for nutrients listed on the label. Shopping Avoid buying canned, pre-made, or processed foods. These foods tend to be high in fat, sodium, and added sugar. Shop around the outside edge of the grocery store. This is where you will most often find fresh fruits and vegetables, bulk grains, fresh meats, and fresh dairy. Cooking Use low-heat cooking methods, such as baking, instead of high-heat cooking methods like deep frying. Cook using healthy oils, such as olive, canola, or sunflower oil. Avoid cooking with butter, cream, or high-fat meats. Meal planning Eat meals and snacks regularly, preferably at the same times every day. Avoid going long periods of time without eating. Eat foods that are high in fiber, such as fresh fruits, vegetables, beans, and whole grains. Talk with your dietitian about how many servings of carbs you can eat at each meal. Eat 4-6 oz (112-168 g) of lean protein each day, such as lean meat, chicken, fish, eggs, or tofu. One ounce (oz) of lean protein is equal to: 1 oz (28 g) of meat, chicken, or fish. 1 egg.  cup (62 g) of tofu. Eat some foods each day that contain healthy fats, such as avocado, nuts, seeds, and fish. What foods should I  eat? Fruits Berries. Apples. Oranges. Peaches. Apricots. Plums. Grapes. Mango. Papaya.Pomegranate. Kiwi. Cherries. Vegetables Lettuce. Spinach. Leafy greens, including kale, chard, collard greens, and mustard greens. Beets. Cauliflower. Cabbage. Broccoli. Carrots. Green beans.Tomatoes. Peppers. Onions. Cucumbers. Brussels sprouts. Grains Whole grains, such as whole-wheat or whole-grain bread, crackers, tortillas,cereal, and pasta. Unsweetened oatmeal. Quinoa. Brown or wild rice. Meats and other proteins Seafood. Poultry without skin. Lean cuts of poultry and beef. Tofu. Nuts. Seeds. Dairy Low-fat or fat-free dairy products such as milk, yogurt, and cheese. The items listed above may not be a complete list of foods and beverages you can eat. Contact a dietitian for more information. What foods should I avoid? Fruits Fruits canned with syrup. Vegetables Canned vegetables. Frozen vegetables with butter or cream sauce. Grains Refined white flour and flour products such as bread, pasta, snack foods, andcereals. Avoid all processed foods. Meats and other proteins Fatty cuts of meat. Poultry with skin. Breaded or fried meats. Processed meat.Avoid saturated fats. Dairy Full-fat yogurt, cheese, or milk. Beverages Sweetened drinks, such as soda or iced tea. The items listed above may not be a complete list of foods and beverages you should avoid. Contact a dietitian for more information. Questions to ask a health care provider Do I need to meet with a diabetes educator? Do I need to meet with a dietitian? What number can I call if I have questions? When  are the best times to check my blood glucose? Where to find more information: American Diabetes Association: diabetes.org Academy of Nutrition and Dietetics: www.eatright.[employer] of Diabetes and Digestive and Kidney Diseases: [employer] Association of Diabetes Care and Education Specialists:  www.diabeteseducator.org Summary It is important to have healthy eating habits because your blood sugar (glucose) levels are greatly affected by what you eat and drink. A healthy meal plan will help you control your blood glucose and maintain a healthy lifestyle. Your health care provider may recommend that you work with a dietitian to make a meal plan that is best for you. Keep in mind that carbohydrates (carbs) and alcohol have immediate effects on your blood glucose levels. It is important to count carbs and to use alcohol carefully. This information is not intended to replace advice given to you by your health care provider. Make sure you discuss any questions you have with your healthcare provider. Document Revised: 02/10/2019 Document Reviewed: 02/10/2019 Elsevier Patient Education  2021 [place], [place] [facility]

## 2020-10-26 NOTE — Assessment & Plan Note (Signed)
Much improved diabetes with hemoglobin A1c of 6.9.  Continue glipizide twice a day, Trulicity weekly, and Jardiance 10 mg daily. Diet and nutrition discussed. Continue to follow-up with endocrinologist. Follow-up in 6 months.

## 2020-10-26 NOTE — Assessment & Plan Note (Signed)
Persistent chronic low back pain affecting quality of life with abnormal recent MRI.  Plan is to schedule second surgery.  Will follow-up with orthopedic surgeon as scheduled.  Waiting on Medicaid approval for surgery.

## 2020-10-26 NOTE — Patient Instructions (Signed)
Diabetes Mellitus and Nutrition, Adult When you have diabetes, or diabetes mellitus, it is very important to have healthy eating habits because your blood sugar (glucose) levels are greatly affected by what you eat and drink. Eating healthy foods in the right amounts, at about the same times every day, can help you:  Control your blood glucose.  Lower your risk of heart disease.  Improve your blood pressure.  Reach or maintain a healthy weight. What can affect my meal plan? Every person with diabetes is different, and each person has different needs for a meal plan. Your health care provider may recommend that you work with a dietitian to make a meal plan that is best for you. Your meal plan may vary depending on factors such as:  The calories you need.  The medicines you take.  Your weight.  Your blood glucose, blood pressure, and cholesterol levels.  Your activity level.  Other health conditions you have, such as heart or kidney disease. How do carbohydrates affect me? Carbohydrates, also called carbs, affect your blood glucose level more than any other type of food. Eating carbs naturally raises the amount of glucose in your blood. Carb counting is a method for keeping track of how many carbs you eat. Counting carbs is important to keep your blood glucose at a healthy level, especially if you use insulin or take certain oral diabetes medicines. It is important to know how many carbs you can safely have in each meal. This is different for every person. Your dietitian can help you calculate how many carbs you should have at each meal and for each snack. How does alcohol affect me? Alcohol can cause a sudden decrease in blood glucose (hypoglycemia), especially if you use insulin or take certain oral diabetes medicines. Hypoglycemia can be a life-threatening condition. Symptoms of hypoglycemia, such as sleepiness, dizziness, and confusion, are similar to symptoms of having too much  alcohol.  Do not drink alcohol if: ? Your health care provider tells you not to drink. ? You are pregnant, may be pregnant, or are planning to become pregnant.  If you drink alcohol: ? Do not drink on an empty stomach. ? Limit how much you use to:  0-1 drink a day for women.  0-2 drinks a day for men. ? Be aware of how much alcohol is in your drink. In the [place] one drink equals one 12 oz bottle of beer (355 mL), one 5 oz glass of wine (148 mL), or one 1 oz glass of hard liquor (44 mL). ? Keep yourself hydrated with water, diet soda, or unsweetened iced tea.  Keep in mind that regular soda, juice, and other mixers may contain a lot of sugar and must be counted as carbs. What are tips for following this plan? Reading food labels  Start by checking the serving size on the "Nutrition Facts" label of packaged foods and drinks. The amount of calories, carbs, fats, and other nutrients listed on the label is based on one serving of the item. Many items contain more than one serving per package.  Check the total grams (g) of carbs in one serving. You can calculate the number of servings of carbs in one serving by dividing the total carbs by 15. For example, if a food has 30 g of total carbs per serving, it would be equal to 2 servings of carbs.  Check the number of grams (g) of saturated fats and trans fats in one serving. Choose foods that have   a low amount or none of these fats.  Check the number of milligrams (mg) of salt (sodium) in one serving. Most people should limit total sodium intake to less than 2,300 mg per day.  Always check the nutrition information of foods labeled as "low-fat" or "nonfat." These foods may be higher in added sugar or refined carbs and should be avoided.  Talk to your dietitian to identify your daily goals for nutrients listed on the label. Shopping  Avoid buying canned, pre-made, or processed foods. These foods tend to be high in fat, sodium, and added  sugar.  Shop around the outside edge of the grocery store. This is where you will most often find fresh fruits and vegetables, bulk grains, fresh meats, and fresh dairy. Cooking  Use low-heat cooking methods, such as baking, instead of high-heat cooking methods like deep frying.  Cook using healthy oils, such as olive, canola, or sunflower oil.  Avoid cooking with butter, cream, or high-fat meats. Meal planning  Eat meals and snacks regularly, preferably at the same times every day. Avoid going long periods of time without eating.  Eat foods that are high in fiber, such as fresh fruits, vegetables, beans, and whole grains. Talk with your dietitian about how many servings of carbs you can eat at each meal.  Eat 4-6 oz (112-168 g) of lean protein each day, such as lean meat, chicken, fish, eggs, or tofu. One ounce (oz) of lean protein is equal to: ? 1 oz (28 g) of meat, chicken, or fish. ? 1 egg. ?  cup (62 g) of tofu.  Eat some foods each day that contain healthy fats, such as avocado, nuts, seeds, and fish.   What foods should I eat? Fruits Berries. Apples. Oranges. Peaches. Apricots. Plums. Grapes. Mango. Papaya. Pomegranate. Kiwi. Cherries. Vegetables Lettuce. Spinach. Leafy greens, including kale, chard, collard greens, and mustard greens. Beets. Cauliflower. Cabbage. Broccoli. Carrots. Green beans. Tomatoes. Peppers. Onions. Cucumbers. Brussels sprouts. Grains Whole grains, such as whole-wheat or whole-grain bread, crackers, tortillas, cereal, and pasta. Unsweetened oatmeal. Quinoa. Brown or wild rice. Meats and other proteins Seafood. Poultry without skin. Lean cuts of poultry and beef. Tofu. Nuts. Seeds. Dairy Low-fat or fat-free dairy products such as milk, yogurt, and cheese. The items listed above may not be a complete list of foods and beverages you can eat. Contact a dietitian for more information. What foods should I avoid? Fruits Fruits canned with  syrup. Vegetables Canned vegetables. Frozen vegetables with butter or cream sauce. Grains Refined white flour and flour products such as bread, pasta, snack foods, and cereals. Avoid all processed foods. Meats and other proteins Fatty cuts of meat. Poultry with skin. Breaded or fried meats. Processed meat. Avoid saturated fats. Dairy Full-fat yogurt, cheese, or milk. Beverages Sweetened drinks, such as soda or iced tea. The items listed above may not be a complete list of foods and beverages you should avoid. Contact a dietitian for more information. Questions to ask a health care provider  Do I need to meet with a diabetes educator?  Do I need to meet with a dietitian?  What number can I call if I have questions?  When are the best times to check my blood glucose? Where to find more information:  American Diabetes Association: diabetes.org  Academy of Nutrition and Dietetics: www.eatright.org  National Institute of Diabetes and Digestive and Kidney Diseases: www.niddk.nih.gov  Association of Diabetes Care and Education Specialists: www.diabeteseducator.org Summary  It is important to have healthy eating   habits because your blood sugar (glucose) levels are greatly affected by what you eat and drink.  A healthy meal plan will help you control your blood glucose and maintain a healthy lifestyle.  Your health care provider may recommend that you work with a dietitian to make a meal plan that is best for you.  Keep in mind that carbohydrates (carbs) and alcohol have immediate effects on your blood glucose levels. It is important to count carbs and to use alcohol carefully. This information is not intended to replace advice given to you by your health care provider. Make sure you discuss any questions you have with your health care provider. Document Revised: 02/10/2019 Document Reviewed: 02/10/2019 Elsevier Patient Education  2021 Elsevier Inc.  

## 2020-10-31 ENCOUNTER — Other Ambulatory Visit: Payer: Self-pay

## 2020-11-02 ENCOUNTER — Encounter: Payer: Self-pay | Admitting: *Deleted

## 2020-11-03 ENCOUNTER — Encounter: Payer: Self-pay | Admitting: Surgery

## 2020-11-03 ENCOUNTER — Ambulatory Visit (INDEPENDENT_AMBULATORY_CARE_PROVIDER_SITE_OTHER): Payer: Medicaid Other | Admitting: Surgery

## 2020-11-03 ENCOUNTER — Emergency Department (HOSPITAL_COMMUNITY)
Admission: EM | Admit: 2020-11-03 | Discharge: 2020-11-03 | Disposition: A | Discharge status: Home / Self Care | Payer: Medicaid Other | Attending: Emergency Medicine | Admitting: Emergency Medicine

## 2020-11-03 ENCOUNTER — Other Ambulatory Visit: Payer: Self-pay

## 2020-11-03 ENCOUNTER — Encounter (HOSPITAL_COMMUNITY): Payer: Self-pay | Admitting: Emergency Medicine

## 2020-11-03 VITALS — BP 119/80 | HR 99 | Ht 69.0 in | Wt 196.8 lb

## 2020-11-03 DIAGNOSIS — E1142 Type 2 diabetes mellitus with diabetic polyneuropathy: Secondary | ICD-10-CM | POA: Insufficient documentation

## 2020-11-03 DIAGNOSIS — Z7984 Long term (current) use of oral hypoglycemic drugs: Secondary | ICD-10-CM | POA: Diagnosis not present

## 2020-11-03 DIAGNOSIS — S60421A Blister (nonthermal) of left index finger, initial encounter: Secondary | ICD-10-CM | POA: Diagnosis not present

## 2020-11-03 DIAGNOSIS — X58XXXA Exposure to other specified factors, initial encounter: Secondary | ICD-10-CM | POA: Insufficient documentation

## 2020-11-03 DIAGNOSIS — S6992XA Unspecified injury of left wrist, hand and finger(s), initial encounter: Secondary | ICD-10-CM | POA: Diagnosis present

## 2020-11-03 DIAGNOSIS — Z794 Long term (current) use of insulin: Secondary | ICD-10-CM | POA: Diagnosis not present

## 2020-11-03 DIAGNOSIS — T148XXA Other injury of unspecified body region, initial encounter: Secondary | ICD-10-CM

## 2020-11-03 DIAGNOSIS — M5126 Other intervertebral disc displacement, lumbar region: Secondary | ICD-10-CM

## 2020-11-03 DIAGNOSIS — Z87891 Personal history of nicotine dependence: Secondary | ICD-10-CM | POA: Diagnosis not present

## 2020-11-03 MED ORDER — BACITRACIN ZINC 500 UNIT/GM EX OINT
TOPICAL_OINTMENT | Freq: Two times a day (BID) | CUTANEOUS | Status: DC
  Filled 2020-11-03: qty 0.9

## 2020-11-03 NOTE — ED Provider Notes (Signed)
[facility] DEPT Provider Note   CSN: [id number] Arrival date & time: 11/03/20  1116     History Chief Complaint  Patient presents with   Blister    Melinda Stone is a 40 y.o. female with a past medical history significant for diabetes and chronic low back pain who presents to the ED due to a blister on left index finger.  Patient states blister appeared yesterday.  No known injury.  No known burn.  Patient states blister is "hard".  Denies drainage.  No fever or chills.  No decreased range of motion of finger.  No surrounding erythema, edema, or warmth.  No other injuries or blisters.  No treatment prior to arrival.  No aggravating or alleviating symptoms.  History obtained from patient and past medical records. No interpreter used during encounter.       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   Chronic pain syndrome 06/14/2020   Pharmacologic therapy 06/14/2020   Disorder of skeletal system 06/14/2020   Problems influencing health status 06/14/2020   History of marijuana use 29/47/6546   History of illicit drug use 50/35/4656   Abnormal drug screen (05/25/2020) 06/14/2020   Marijuana use 06/14/2020   Abnormal MRI, lumbar spine (05/16/2020) 06/14/2020   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, L5-S1) 06/14/2020   Lumbar lateral recess stenosis (Left: L4-5, L5-S1) 06/14/2020   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) 06/14/2020   S/P lumbar microdiscectomy 06/07/2020   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   GI bleed 12/01/2019   Muscle spasm 11/06/2019   Sciatica 11/06/2019   Low back pain 02/04/2019   Type 2 diabetes mellitus with diabetic polyneuropathy, without long-term  current use of insulin ([place]) 12/03/2018   Type 2 diabetes mellitus with hyperglycemia, without long-term current use of insulin ([place]) 05/15/2018    Past Surgical History:  Procedure Laterality Date   CESAREAN SECTION     CHOLECYSTECTOMY  2004   LUMBAR LAMINECTOMY N/A 05/30/2020   Procedure: Lumbar five Laminectomy,  Bilateral Microdiscectomy, Left Lumbar five -Sacral one Microdiscectomy;  Surgeon: Marybelle Killings, MD;  Location: [facility];  Service: Orthopedics;  Laterality: N/A;   TUBAL LIGATION  10/19/2010     OB History   No obstetric history on file.     Family History  Problem Relation Age of Onset   Diabetes Mother    Hypertension Mother    Stroke Mother     Social History   Tobacco Use   Smoking status: Former   Smokeless tobacco: Never   Tobacco comments:    black and milds per paitent 1-2/day  Vaping Use   Vaping Use: Never used  Substance Use Topics   Alcohol use: Yes    Comment: socially    Drug use: Yes    Types: Marijuana    Comment: per paitent stopped smoking marijuana Mar 2022    Home Medications Prior to Admission medications   Medication Sig Start Date End Date Taking? Authorizing Provider  acetaminophen (TYLENOL) 650 MG CR tablet Take 1,300 mg by mouth 2 (two) times daily.    [provider]  Blood Glucose Monitoring Suppl (ACCU-CHEK GUIDE ME) w/Device KIT Use as instructed  to check blood sugar daily 07/13/20   Shamleffer, Melanie Crazier, MD  DULoxetine (CYMBALTA) 30 MG capsule Take 1 capsule (30 mg total) by mouth daily. Patient taking differently: Take 30 mg by mouth every Friday. 03/21/20   Hilts, Legrand Como, MD  empagliflozin (JARDIANCE) 10 MG TABS tablet Take 1 tablet (10 mg total) by mouth daily. Patient not taking: No sig reported 10/10/20   Shamleffer, Melanie Crazier, MD  ferrous gluconate (FERGON) 324 MG tablet Take 1 tablet (324 mg total) by mouth daily with breakfast. Patient not taking: No sig reported 01/30/19   Jacelyn Pi, Lilia Argue,  MD  gabapentin (NEURONTIN) 600 MG tablet Take 1 tablet (600 mg total) by mouth 2 (two) times daily AND 2 tablets (1,200 mg total) at bedtime. Patient not taking: Reported on 10/31/2020 04/28/20 01/05/21  Horald Pollen, MD  glipiZIDE (GLUCOTROL) 10 MG tablet Take 2 tablets (20 mg total) by mouth 2 (two) times daily before a meal. 07/07/20   Shamleffer, Melanie Crazier, MD  glucose blood (ACCU-CHEK GUIDE) test strip Use as instructed to check blood sugar 2 times daily 07/13/20   Shamleffer, Melanie Crazier, MD  HYDROcodone-acetaminophen (NORCO/VICODIN) 5-325 MG tablet Take 1 tablet by mouth every 4 (four) hours as needed. Patient taking differently: Take 1 tablet by mouth every 4 (four) hours as needed for moderate pain. 10/25/20   Fransico Meadow, PA-C  methocarbamol (ROBAXIN) 500 MG tablet Take 1 tablet (500 mg total) by mouth 2 (two) times daily. 10/25/20   Khatri, Hina, PA-C  pregabalin (LYRICA) 150 MG capsule Take 150 mg by mouth 2 (two) times daily. 10/28/20   [provider]  traMADol (ULTRAM) 50 MG tablet Take 1 tablet (50 mg total) by mouth 3 (three) times daily as needed. Patient not taking: No sig reported 10/11/20   Marybelle Killings, MD  TRULICITY 1.61 WR/6.0AV SOPN Inject 0.75 mg into the skin once a week. 10/13/20   [provider]    Allergies    Patient has no known allergies.  Review of Systems   Review of Systems  Constitutional:  Negative for chills and fever.  Skin:  Positive for wound.  All other systems reviewed and are negative.  Physical Exam Updated Vital Signs BP 123/89   Pulse (!) 102   Temp 98.1 F (36.7 C) (Oral)   Resp 17   LMP 10/25/2020   SpO2 100%   Physical Exam Vitals and nursing note reviewed.  Constitutional:      General: She is not in acute distress.    Appearance: She is not ill-appearing.  HENT:     Head: Normocephalic.  Eyes:     Pupils: Pupils are equal, round, and reactive to light.  Cardiovascular:     Rate and Rhythm:  Normal rate and regular rhythm.     Pulses: Normal pulses.     Heart sounds: Normal heart sounds. No murmur heard.   No friction rub. No gallop.  Pulmonary:     Effort: Pulmonary effort is normal.     Breath sounds: Normal breath sounds.  Abdominal:     General: Abdomen is flat. There is no distension.     Palpations: Abdomen is soft.     Tenderness: There is no abdominal tenderness. There is no guarding or rebound.  Musculoskeletal:        General: Normal range of motion.     Cervical back: Neck supple.     Comments: Full range of motion of all left fingers and  wrist.  Radial pulse intact.  Skin:    General: Skin is warm and dry.     Comments: Fluid-filled blister to left index finger.  No surrounding erythema.  No drainage.  Neurological:     General: No focal deficit present.     Mental Status: She is alert.  Psychiatric:        Mood and Affect: Mood normal.        Behavior: Behavior normal.    ED Results / Procedures / Treatments   Labs (all labs ordered are listed, but only abnormal results are displayed) Labs Reviewed - No data to display  EKG None  Radiology No results found.  Procedures Procedures   Medications Ordered in ED Medications  bacitracin ointment (has no administration in time range)    ED Course  I have reviewed the triage vital signs and the nursing notes.  Pertinent labs & imaging results that were available during my care of the patient were reviewed by me and considered in my medical decision making (see chart for details).    MDM Rules/Calculators/A&P                          40 year old female presents to the ED due to blister on left index finger which appears to be from a possible burn.  Patient denies any injury.  No fever or chills.  Upon arrival, stable vitals.  Patient in no acute distress.  Blister to left index finger with no surrounding erythema or drainage.  Low suspicion for bacterial infection.  Blister appears to be from a  superficial burn.  Ointment and bandage placed on blister.  Advised patient not to pop blister.  Wound care discussed with patient.  Advised patient to follow-up with PCP within 1 week if symptoms not improved. Strict ED precautions discussed with patient. Patient states understanding and agrees to plan. Patient discharged home in no acute distress and stable vitals  Final Clinical Impression(s) / ED Diagnoses Final diagnoses:  Blister    Rx / DC Orders ED Discharge Orders     None        Karie Kirks 11/03/20 1219    Milton Ferguson, MD 11/07/20 1609

## 2020-11-03 NOTE — Discharge Instructions (Addendum)
It was a pleasure taking care of you today.  As discussed, I suspect your blister is from a possible burn.  You may use over-the-counter Neosporin.  Do not pop blister. Blister should improve over the next week.  Please follow-up with PCP symptoms not improve over the next week.  Return to the ER for new or worsening symptoms.

## 2020-11-03 NOTE — ED Triage Notes (Signed)
Patient c/o blister to L index finger since yesterday without injury.

## 2020-11-03 NOTE — Progress Notes (Signed)
Surgical Instructions    Your procedure is scheduled on Monday, August 29th.  Report to [facility] Main Entrance "A" at 10:30 A.M., then check in with the Admitting office.  Call this number if you have problems the morning of surgery:  [phone]   If you have any questions prior to your surgery date call [phone]: Open Monday-Friday 8am-4pm    Remember:  Do not eat after midnight the night before your surgery  You may drink clear liquids until 9:30 AM the morning of your surgery.   Clear liquids allowed are: Water, Non-Citrus Juices (without pulp), Carbonated Beverages, Clear Tea, Black Coffee Only, and Gatorade  Please complete your water that was provided to you by 9:30 AM the morning of surgery.  Please, if able, drink it in one setting. DO NOT SIP.     Take these medicines the morning of surgery with A SIP OF WATER   Tylenol - if needed  Hydrocodone-Acetaminophen (Norco/Vicodin) - if needed  Pregabalin (Lyrica)   As of today, STOP taking any Aspirin (unless otherwise instructed by your surgeon) Aleve, Naproxen, Ibuprofen, Motrin, Advil, Goody's, BC's, all herbal medications, fish oil, and all vitamins.  WHAT DO I DO ABOUT MY DIABETES MEDICATION?   Do not take oral diabetes medicines (pills) the morning of surgery. - Glipizide (Glucotrol)  THE NIGHT BEFORE SURGERY, do not take evening dose of Glipizide (Glucotrol).  Only take morning does, the day before surgery.     The day of surgery, do not take other diabetes injectables, including Trulicity (dulaglutide).  If your CBG is greater than 220 mg/dL, you may take  of your sliding scale (correction) dose of insulin.   HOW TO MANAGE YOUR DIABETES BEFORE AND AFTER SURGERY  Why is it important to control my blood sugar before and after surgery? Improving blood sugar levels before and after surgery helps healing and can limit problems. A way of improving blood sugar control is eating a healthy diet by:  Eating less  sugar and carbohydrates  Increasing activity/exercise  Talking with your doctor about reaching your blood sugar goals High blood sugars (greater than 180 mg/dL) can raise your risk of infections and slow your recovery, so you will need to focus on controlling your diabetes during the weeks before surgery. Make sure that the doctor who takes care of your diabetes knows about your planned surgery including the date and location.  How do I manage my blood sugar before surgery? Check your blood sugar at least 4 times a day, starting 2 days before surgery, to make sure that the level is not too high or low.  Check your blood sugar the morning of your surgery when you wake up and every 2 hours until you get to the Short Stay unit.  If your blood sugar is less than 70 mg/dL, you will need to treat for low blood sugar: Do not take insulin. Treat a low blood sugar (less than 70 mg/dL) with  cup of clear juice (cranberry or apple), 4 glucose tablets, OR glucose gel. Recheck blood sugar in 15 minutes after treatment (to make sure it is greater than 70 mg/dL). If your blood sugar is not greater than 70 mg/dL on recheck, call [phone] for further instructions. Report your blood sugar to the short stay nurse when you get to Short Stay.  If you are admitted to the hospital after surgery: Your blood sugar will be checked by the staff and you will probably be given insulin after surgery (instead  of oral diabetes medicines) to make sure you have good blood sugar levels. The goal for blood sugar control after surgery is 80-180 mg/dL.           DAY OF SURGERY: Do not wear jewelry, makeup, or nail polish Do not wear lotions, powders, perfumes/colognes, or deodorant. Do not shave 48 hours prior to surgery.  Men may shave face and neck. Do not bring valuables to the hospital.             [facility] is not responsible for any belongings or valuables.  Do NOT Smoke (Tobacco/Vaping) or drink Alcohol 24  hours prior to your procedure If you use a CPAP at night, you may bring all equipment for your overnight stay.   Contacts, glasses, dentures or bridgework may not be worn into surgery, please bring cases for these belongings   For patients admitted to the hospital, discharge time will be determined by your treatment team.   Patients discharged the day of surgery will not be allowed to drive home, and someone needs to stay with them for 24 hours.  ONLY 1 SUPPORT PERSON MAY BE PRESENT WHILE YOU ARE IN SURGERY. IF YOU ARE TO BE ADMITTED ONCE YOU ARE IN YOUR ROOM YOU WILL BE ALLOWED TWO (2) VISITORS.  Minor children may have two parents present. Special consideration for safety and communication needs will be reviewed on a case by case basis.  Special instructions:    Oral Hygiene is also important to reduce your risk of infection.  Remember - BRUSH YOUR TEETH THE MORNING OF SURGERY WITH YOUR REGULAR TOOTHPASTE   [place]- Preparing For Surgery  Before surgery, you can play an important role. Because skin is not sterile, your skin needs to be as free of germs as possible. You can reduce the number of germs on your skin by washing with CHG (chlorahexidine gluconate) Soap before surgery.  CHG is an antiseptic cleaner which kills germs and bonds with the skin to continue killing germs even after washing.     Please do not use if you have an allergy to CHG or antibacterial soaps. If your skin becomes reddened/irritated stop using the CHG.  Do not shave (including legs and underarms) for at least 48 hours prior to first CHG shower. It is OK to shave your face.  Please follow these instructions carefully.     Shower the NIGHT BEFORE SURGERY and the MORNING OF SURGERY with CHG Soap.   If you chose to wash your hair, wash your hair first as usual with your normal shampoo. After you shampoo, rinse your hair and body thoroughly to remove the shampoo.  Then [employer] and genitals (private parts) with  your normal soap and rinse thoroughly to remove soap.  After that Use CHG Soap as you would any other liquid soap. You can apply CHG directly to the skin and wash gently with a scrungie or a clean washcloth.   Apply the CHG Soap to your body ONLY FROM THE NECK DOWN.  Do not use on open wounds or open sores. Avoid contact with your eyes, ears, mouth and genitals (private parts). Wash Face and genitals (private parts)  with your normal soap.   Wash thoroughly, paying special attention to the area where your surgery will be performed.  Thoroughly rinse your body with warm water from the neck down.  DO NOT shower/wash with your normal soap after using and rinsing off the CHG Soap.  Pat yourself dry with  a CLEAN TOWEL.  Wear CLEAN PAJAMAS to bed the night before surgery  Place CLEAN SHEETS on your bed the night before your surgery  DO NOT SLEEP WITH PETS.   Day of Surgery:  Take a shower with CHG soap. Wear Clean/Comfortable clothing the morning of surgery Do not apply any deodorants/lotions.   Remember to brush your teeth WITH YOUR REGULAR TOOTHPASTE.   Please read over the following fact sheets that you were given.

## 2020-11-04 ENCOUNTER — Encounter (HOSPITAL_COMMUNITY): Payer: Self-pay

## 2020-11-04 ENCOUNTER — Encounter (HOSPITAL_COMMUNITY)
Admission: RE | Admit: 2020-11-04 | Discharge: 2020-11-04 | Disposition: A | Discharge status: Home / Self Care | Payer: Medicaid Other | Source: Ambulatory Visit | Attending: Orthopaedic Surgery | Admitting: Orthopaedic Surgery

## 2020-11-04 DIAGNOSIS — Z01818 Encounter for other preprocedural examination: Secondary | ICD-10-CM | POA: Diagnosis present

## 2020-11-04 LAB — COMPREHENSIVE METABOLIC PANEL
ALT: 10 U/L (ref 0–44)
AST: 14 U/L — ABNORMAL LOW (ref 15–41)
Albumin: 3.6 g/dL (ref 3.5–5.0)
Alkaline Phosphatase: 80 U/L (ref 38–126)
Anion gap: 6 (ref 5–15)
BUN: 7 mg/dL (ref 6–20)
CO2: 24 mmol/L (ref 22–32)
Calcium: 8.7 mg/dL — ABNORMAL LOW (ref 8.9–10.3)
Chloride: 106 mmol/L (ref 98–111)
Creatinine, Ser: 0.45 mg/dL (ref 0.44–1.00)
GFR, Estimated: >60 mL/min (ref >60)
Glucose, Bld: 138 mg/dL — ABNORMAL HIGH (ref 70–99)
Potassium: 4 mmol/L (ref 3.5–5.1)
Sodium: 136 mmol/L (ref 135–145)
Total Bilirubin: 1.2 mg/dL (ref 0.3–1.2)
Total Protein: 6.6 g/dL (ref 6.5–8.1)

## 2020-11-04 LAB — SURGICAL PCR SCREEN
MRSA, PCR: NEGATIVE
Staphylococcus aureus: NEGATIVE

## 2020-11-04 LAB — CBC
HCT: 30.9 % — ABNORMAL LOW (ref 36.0–46.0)
Hemoglobin: 10.7 g/dL — ABNORMAL LOW (ref 12.0–15.0)
MCH: 29.2 pg (ref 26.0–34.0)
MCHC: 34.6 g/dL (ref 30.0–36.0)
MCV: 84.2 fL (ref 80.0–100.0)
Platelets: 201 10*3/uL (ref 150–400)
RBC: 3.67 MIL/uL — ABNORMAL LOW (ref 3.87–5.11)
RDW: 21 % — ABNORMAL HIGH (ref 11.5–15.5)
WBC: 8.7 10*3/uL (ref 4.0–10.5)
nRBC: 0 % (ref 0.0–0.2)

## 2020-11-04 LAB — URINALYSIS, ROUTINE W REFLEX MICROSCOPIC
Bilirubin Urine: NEGATIVE
Glucose, UA: 50 mg/dL — AB
Hgb urine dipstick: NEGATIVE
Ketones, ur: NEGATIVE mg/dL
Leukocytes,Ua: NEGATIVE
Nitrite: NEGATIVE
Protein, ur: NEGATIVE mg/dL
Specific Gravity, Urine: 1.019 (ref 1.005–1.030)
pH: 5 (ref 5.0–8.0)

## 2020-11-04 LAB — GLUCOSE, CAPILLARY: Glucose-Capillary: 171 mg/dL — ABNORMAL HIGH (ref 70–99)

## 2020-11-04 NOTE — Progress Notes (Signed)
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  States that symptoms unchanged in previous visit.  She is want to proceed with left L4-5 microdiscectomy as scheduled.  Patient states that she is having bilateral lower extremity radicular symptoms.  Today history and physical performed.  Review of systems negative.*Procedure discussed.  All questions answered.

## 2020-11-04 NOTE — Progress Notes (Signed)
PCP - Anselm Jungling, NP Cardiologist - denies  PPM/ICD - denies  Chest x-ray - 02/18/17 EKG - 11/04/20, at PAT appt Stress Test - denies ECHO - denies Cardiac Cath - denies  Sleep Study - denies  DM: Type 2 Fasting Blood Sugar - 47-209 per pt Checks Blood Sugar once a day  Blood Thinner Instructions: n/a Aspirin Instructions: n/a  ERAS Protcol - yes PRE-SURGERY G2  COVID TEST- pt instructed to go to COVID testing center on 8/25. Instructions given.   Anesthesia review: No  Patient denies shortness of breath, fever, cough and chest pain at PAT appointment   All instructions explained to the patient, with a verbal understanding of the material. Patient agrees to go over the instructions while at home for a better understanding. Patient also instructed to self quarantine after being tested for COVID-19. The opportunity to ask questions was provided.

## 2020-11-07 ENCOUNTER — Other Ambulatory Visit: Payer: Self-pay

## 2020-11-07 ENCOUNTER — Ambulatory Visit: Payer: Medicaid Other | Admitting: Diagnostic Neuroimaging

## 2020-11-07 ENCOUNTER — Encounter (HOSPITAL_COMMUNITY): Payer: Self-pay

## 2020-11-07 ENCOUNTER — Emergency Department (HOSPITAL_COMMUNITY)
Admission: EM | Admit: 2020-11-07 | Discharge: 2020-11-07 | Disposition: A | Discharge status: Home / Self Care | Payer: Medicaid Other | Attending: Emergency Medicine | Admitting: Emergency Medicine

## 2020-11-07 ENCOUNTER — Encounter: Payer: Self-pay | Admitting: Diagnostic Neuroimaging

## 2020-11-07 VITALS — BP 109/75 | HR 112 | Ht 69.0 in | Wt 195.2 lb

## 2020-11-07 DIAGNOSIS — E1165 Type 2 diabetes mellitus with hyperglycemia: Secondary | ICD-10-CM | POA: Diagnosis not present

## 2020-11-07 DIAGNOSIS — F17290 Nicotine dependence, other tobacco product, uncomplicated: Secondary | ICD-10-CM | POA: Insufficient documentation

## 2020-11-07 DIAGNOSIS — M5416 Radiculopathy, lumbar region: Secondary | ICD-10-CM

## 2020-11-07 DIAGNOSIS — G8929 Other chronic pain: Secondary | ICD-10-CM | POA: Diagnosis not present

## 2020-11-07 DIAGNOSIS — R531 Weakness: Secondary | ICD-10-CM | POA: Insufficient documentation

## 2020-11-07 DIAGNOSIS — Z7984 Long term (current) use of oral hypoglycemic drugs: Secondary | ICD-10-CM | POA: Diagnosis not present

## 2020-11-07 DIAGNOSIS — Z79899 Other long term (current) drug therapy: Secondary | ICD-10-CM | POA: Diagnosis not present

## 2020-11-07 DIAGNOSIS — M545 Low back pain, unspecified: Secondary | ICD-10-CM | POA: Diagnosis not present

## 2020-11-07 DIAGNOSIS — E1142 Type 2 diabetes mellitus with diabetic polyneuropathy: Secondary | ICD-10-CM

## 2020-11-07 DIAGNOSIS — M5440 Lumbago with sciatica, unspecified side: Secondary | ICD-10-CM

## 2020-11-07 DIAGNOSIS — R000 Tachycardia, unspecified: Secondary | ICD-10-CM | POA: Insufficient documentation

## 2020-11-07 MED ORDER — HYDROCODONE-ACETAMINOPHEN 5-325 MG PO TABS: 1.0000 | ORAL_TABLET | Freq: Four times a day (QID) | ORAL | 0 refills | Status: DC | PRN

## 2020-11-07 NOTE — ED Triage Notes (Signed)
Pt reports back pain that radiates down both legs for a few days.

## 2020-11-07 NOTE — Patient Instructions (Signed)
LOWER EXTREMITY PAIN (due to lumbar radiculopathies + diabetic neuropathy) - continue lyrica 150mg  three times a day  - follow up with pain mgmt: - consider duloxetine 30-60mg  daily or amitriptyline 25-50mg  at bedtime - consider capsaicin cream, lidocaine patch / cream, alpha-lipoic acid 600mg  daily

## 2020-11-07 NOTE — Progress Notes (Signed)
[facility]  PATIENT: Melinda Stone DOB: 08-19-1980  REFERRING CLINICIAN: Lauretta Grill, NP HISTORY FROM: patient  REASON FOR VISIT: New consult   HISTORICAL  CHIEF COMPLAINT:  Chief Complaint  Patient presents with   Numbness, tingling    Rm 7 New Pt "constant numbness/tingling from both knees down x 3 years"    HISTORY OF PRESENT ILLNESS:   40 year old female with type 2 diabetes and chronic low back pain here for evaluation of lower extremity pain and numbness.  Patient has had diabetes since age 10 years old.  She has had lower extremity numbness and tingling for past 3 to 4 years.  She is also been diagnosed with lumbar spinal stenosis and degenerative changes, status post surgery in March 2022.  Follow-up postoperative imaging demonstrates residual spinal stenosis and radiculopathy changes and she is planning to have additional surgery in the next few weeks.  Patient has been under care of pain management in the past.  She has tried gabapentin, Lyrica, Cymbalta, Robaxin, hydrocodone and tramadol in the past.   REVIEW OF SYSTEMS: Full 14 system review of systems performed and negative with exception of: as per HPI.  ALLERGIES: No Known Allergies  HOME MEDICATIONS: Outpatient Medications Prior to Visit  Medication Sig Dispense Refill   acetaminophen (TYLENOL) 650 MG CR tablet Take 1,300 mg by mouth 2 (two) times daily.     Blood Glucose Monitoring Suppl (ACCU-CHEK GUIDE ME) w/Device KIT Use as instructed to check blood sugar daily 1 kit 0   empagliflozin (JARDIANCE) 10 MG TABS tablet Take 1 tablet (10 mg total) by mouth daily. 30 tablet 6   ferrous gluconate (FERGON) 324 MG tablet Take 1 tablet (324 mg total) by mouth daily with breakfast. 90 tablet 1   glipiZIDE (GLUCOTROL) 10 MG tablet Take 2 tablets (20 mg total) by mouth 2 (two) times daily before a meal. 360 tablet 3   glucose blood (ACCU-CHEK GUIDE) test strip Use as instructed to  check blood sugar 2 times daily 100 each 11   pregabalin (LYRICA) 150 MG capsule Take 150 mg by mouth 2 (two) times daily.     TRULICITY 1.27 NT/7.0YF SOPN Inject 0.75 mg into the skin once a week.     DULoxetine (CYMBALTA) 30 MG capsule Take 1 capsule (30 mg total) by mouth daily. (Patient taking differently: No sig reported) 30 capsule 3   gabapentin (NEURONTIN) 600 MG tablet Take 1 tablet (600 mg total) by mouth 2 (two) times daily AND 2 tablets (1,200 mg total) at bedtime. 360 tablet 1   HYDROcodone-acetaminophen (NORCO/VICODIN) 5-325 MG tablet Take 1 tablet by mouth every 4 (four) hours as needed. (Patient taking differently: Take 1 tablet by mouth every 4 (four) hours as needed for moderate pain.) 16 tablet 0   methocarbamol (ROBAXIN) 500 MG tablet Take 1 tablet (500 mg total) by mouth 2 (two) times daily. 20 tablet 0   traMADol (ULTRAM) 50 MG tablet Take 1 tablet (50 mg total) by mouth 3 (three) times daily as needed. 25 tablet 0   medroxyPROGESTERone (DEPO-PROVERA) injection 150 mg      No facility-administered medications prior to visit.    PAST MEDICAL HISTORY: Past Medical History:  Diagnosis Date   Anemia    Arthritis 08/17/2020   Chronic back pain    Diabetes mellitus without complication ([place])    Ovarian cyst    Pneumonia    Polyneuropathy     PAST SURGICAL HISTORY: Past Surgical History:  Procedure  Laterality Date   CESAREAN SECTION     CHOLECYSTECTOMY  2004   LUMBAR LAMINECTOMY N/A 05/30/2020   Procedure: Lumbar five Laminectomy,  Bilateral Microdiscectomy, Left Lumbar five -Sacral one Microdiscectomy;  Surgeon: Marybelle Killings, MD;  Location: [facility];  Service: Orthopedics;  Laterality: N/A;   TUBAL LIGATION  10/19/2010    FAMILY HISTORY: Family History  Problem Relation Age of Onset   Diabetes Mother    Hypertension Mother    Stroke Mother     SOCIAL HISTORY: Social History   Socioeconomic History   Marital status: Single    Spouse name: Not on file    Number of children: 3   Years of education: Not on file   Highest education level: High school graduate  Occupational History    Comment: home maker  Tobacco Use   Smoking status: Every Day    Types: Cigars   Smokeless tobacco: Never   Tobacco comments:    black and milds per patient 1-2/day  Vaping Use   Vaping Use: Never used  Substance and Sexual Activity   Alcohol use: Not Currently   Drug use: Not Currently    Types: Marijuana    Comment: per patient stopped smoking marijuana Mar 2022   Sexual activity: Yes    Birth control/protection: None  Other Topics Concern   Not on file  Social History Narrative   Lives with 3 children   Some coffee   Social Determinants of Health   Financial Resource Strain: Not on file  Food Insecurity: Not on file  Transportation Needs: Not on file  Physical Activity: Not on file  Stress: Not on file  Social Connections: Not on file  Intimate Partner Violence: Not on file     PHYSICAL EXAM  GENERAL EXAM/CONSTITUTIONAL: Vitals:  Vitals:   11/07/20 0852  BP: 109/75  Pulse: (!) 112  Weight: 195 lb 3.2 oz (88.5 kg)  Height: _0  (1.753 m)   Body mass index is 28.83 kg/m. Wt Readings from Last 3 Encounters:  11/07/20 195 lb 3.2 oz (88.5 kg)  11/04/20 195 lb 4.8 oz (88.6 kg)  11/03/20 196 lb 12.8 oz (89.3 kg)   Patient is in no distress; well developed, nourished and groomed; neck is supple  CARDIOVASCULAR: Examination of carotid arteries is normal; no carotid bruits Regular rate and rhythm, no murmurs Examination of peripheral vascular system by observation and palpation is normal  EYES: Ophthalmoscopic exam of optic discs and posterior segments is normal; no papilledema or hemorrhages No results found.  MUSCULOSKELETAL: Gait, strength, tone, movements noted in Neurologic exam below  NEUROLOGIC: MENTAL STATUS:  No flowsheet data found. awake, alert, oriented to person, place and time recent and remote memory  intact normal attention and concentration language fluent, comprehension intact, naming intact fund of knowledge appropriate  CRANIAL NERVE:  2nd - no papilledema on fundoscopic exam 2nd, 3rd, 4th, 6th - pupils equal and reactive to light, visual fields full to confrontation, extraocular muscles intact, no nystagmus 5th - facial sensation symmetric 7th - facial strength symmetric 8th - hearing intact 9th - palate elevates symmetrically, uvula midline 11th - shoulder shrug symmetric 12th - tongue protrusion midline  MOTOR:  normal bulk and tone, full strength in the BUE, BLE  SENSORY:  normal and symmetric to light touch, pinprick, temperature, vibration; EXCEPT DECR IN FEET  COORDINATION:  finger-nose-finger, fine finger movements normal  REFLEXES:  deep tendon reflexes TRACE and symmetric  GAIT/STATION:  narrow based gait; ANTALGIC GAIT  DIAGNOSTIC DATA (LABS, IMAGING, TESTING) - I reviewed patient records, labs, notes, testing and imaging myself where available.  Lab Results  Component Value Date   WBC 8.7 11/04/2020   HGB 10.7 (L) 11/04/2020   HCT 30.9 (L) 11/04/2020   MCV 84.2 11/04/2020   PLT 201 11/04/2020      Component Value Date/Time   NA 136 11/04/2020 0824   NA 136 04/28/2020 1124   K 4.0 11/04/2020 0824   CL 106 11/04/2020 0824   CO2 24 11/04/2020 0824   GLUCOSE 138 (H) 11/04/2020 0824   BUN 7 11/04/2020 0824   BUN 4 (L) 04/28/2020 1124   CREATININE 0.45 11/04/2020 0824   CALCIUM 8.7 (L) 11/04/2020 0824   PROT 6.6 11/04/2020 0824   PROT 6.3 04/28/2020 1124   ALBUMIN 3.6 11/04/2020 0824   ALBUMIN 4.0 04/28/2020 1124   AST 14 (L) 11/04/2020 0824   ALT 10 11/04/2020 0824   ALKPHOS 80 11/04/2020 0824   BILITOT 1.2 11/04/2020 0824   BILITOT 1.7 (H) 04/28/2020 1124   GFRNONAA >60 11/04/2020 0824   GFRAA 139 04/28/2020 1124   Lab Results  Component Value Date   CHOL 96 (L) 04/28/2020   HDL 55 04/28/2020   LDLCALC 23 04/28/2020   TRIG 97  04/28/2020   CHOLHDL 1.7 04/28/2020   Lab Results  Component Value Date   HGBA1C 6.9 (A) 10/26/2020   Lab Results  Component Value Date   MGQQPYPP50 932 01/02/2019   Lab Results  Component Value Date   TSH 1.05 07/07/2020    10/02/20 MRI lumbar spine  1. Postoperative changes from prior decompressive laminectomy with micro discectomy at L4-5. Residual and/or recurrent left subarticular disc protrusion at this level with resultant moderate canal and severe left lateral recess stenosis. 2. Sequelae of prior left hemi laminectomy with micro discectomy at L5-S1 with small residual and/or recurrent left subarticular to foraminal disc protrusion as above.    ASSESSMENT AND PLAN  40 y.o. year old female here with chronic lower extremity pain associated with low back pain, diabetes, lumbar radiculopathies since 2019.   Dx:  1. Lumbar radiculopathy   2. Diabetic polyneuropathy associated with type 2 diabetes mellitus (HCC)     PLAN:  LOWER EXTREMITY PAIN (due to lumbar radiculopathies + diabetic neuropathy) - continue lyrica 135m three times a day  - follow up with pain mgmt: - consider duloxetine 30-638mdaily or amitriptyline 25-5052mt bedtime - consider capsaicin cream, lidocaine patch / cream, alpha-lipoic acid 600m73mily  Return for return to PCP.    VIKRPenni Bombard [id number] in Neurology, Neurophysiology and Neuroimaging  [facility] [address] [place] [id number]

## 2020-11-07 NOTE — ED Provider Notes (Signed)
I provided a substantive portion of the care of this patient.  I personally performed the entirety of the medical decision making for this encounter.      40 year old female presents with worsening chronic back pain.  No new focal findings.  We will refill her analgesics and she will see her physician soon   Lorre Nick, MD 11/07/20 1032

## 2020-11-07 NOTE — Discharge Instructions (Addendum)
You were seen at [facility] Emergency Room on 8/22 for severe back pain. You were given a short prescription for pain medication and should follow up with your surgeon if you need more medication before your Monday surgery. If you start to experience new muscle weakness or inability to move your legs you should come back to the emergency room.

## 2020-11-07 NOTE — ED Provider Notes (Signed)
[facility] DEPT Provider Note   CSN: [id number] Arrival date & time: 11/07/20  4098     History Chief Complaint  Patient presents with   Back Pain    Melinda Stone is a 40 y.o. female.   Back Pain   Patient coming in with a history of chronic back pain. Is scheduled to receive surgery on Monday but is out of her pain medication and says that she cannot make it until then. She was prescribed 16 pills of hydrocodone on the 9th. She tries to only take these at night so she can get some rest. Does not have any new weakness.   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   Chronic pain syndrome 06/14/2020   Pharmacologic therapy 06/14/2020   Disorder of skeletal system 06/14/2020   Problems influencing health status 06/14/2020   History of marijuana use 11/91/4782   History of illicit drug use 95/62/1308   Abnormal drug screen (05/25/2020) 06/14/2020   Marijuana use 06/14/2020   Abnormal MRI, lumbar spine (05/16/2020) 06/14/2020   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, L5-S1) 06/14/2020   Lumbar lateral recess stenosis (Left: L4-5, L5-S1) 06/14/2020   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) 06/14/2020   S/P lumbar microdiscectomy 06/07/2020   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   GI bleed 12/01/2019   Muscle spasm 11/06/2019   Sciatica 11/06/2019   Low back pain 02/04/2019   Type 2 diabetes mellitus with diabetic polyneuropathy, without long-term current use of insulin ([place]) 12/03/2018   Type 2 diabetes mellitus with hyperglycemia, without long-term current use of insulin ([place]) 05/15/2018    Past Surgical History:  Procedure Laterality Date    CESAREAN SECTION     CHOLECYSTECTOMY  2004   LUMBAR LAMINECTOMY N/A 05/30/2020   Procedure: Lumbar five Laminectomy,  Bilateral Microdiscectomy, Left Lumbar five -Sacral one Microdiscectomy;  Surgeon: Marybelle Killings, MD;  Location: [facility];  Service: Orthopedics;  Laterality: N/A;   TUBAL LIGATION  10/19/2010     OB History   No obstetric history on file.     Family History  Problem Relation Age of Onset   Diabetes Mother    Hypertension Mother    Stroke Mother     Social History   Tobacco Use   Smoking status: Every Day    Types: Cigars   Smokeless tobacco: Never   Tobacco comments:    black and milds per patient 1-2/day  Vaping Use   Vaping Use: Never used  Substance Use Topics   Alcohol use: Not Currently   Drug use: Not Currently    Types: Marijuana    Comment: per patient stopped smoking marijuana Mar 2022    Home Medications Prior to Admission medications   Medication Sig Start Date End Date Taking? Authorizing Provider  HYDROcodone-acetaminophen (NORCO/VICODIN) 5-325 MG tablet Take 1 tablet by mouth every 6 (six) hours as needed for moderate pain. 11/07/20 11/07/21 Yes Aida Lemaire, MD  acetaminophen (TYLENOL) 650 MG CR tablet Take 1,300 mg by mouth 2 (two) times daily.    [provider]  Blood Glucose Monitoring Suppl (ACCU-CHEK GUIDE ME) w/Device KIT Use as instructed to check blood  sugar daily 07/13/20   Shamleffer, Melanie Crazier, MD  empagliflozin (JARDIANCE) 10 MG TABS tablet Take 1 tablet (10 mg total) by mouth daily. 10/10/20   Shamleffer, Melanie Crazier, MD  ferrous gluconate (FERGON) 324 MG tablet Take 1 tablet (324 mg total) by mouth daily with breakfast. 01/30/19   Jacelyn Pi, Lilia Argue, MD  glipiZIDE (GLUCOTROL) 10 MG tablet Take 2 tablets (20 mg total) by mouth 2 (two) times daily before a meal. 07/07/20   Shamleffer, Melanie Crazier, MD  glucose blood (ACCU-CHEK GUIDE) test strip Use as instructed to check blood sugar 2 times daily 07/13/20    Shamleffer, Melanie Crazier, MD  pregabalin (LYRICA) 150 MG capsule Take 150 mg by mouth 2 (two) times daily. 10/28/20   [provider]  TRULICITY 4.50 TU/8.8KC SOPN Inject 0.75 mg into the skin once a week. 10/13/20   [provider]    Allergies    Patient has no known allergies.  Review of Systems   Review of Systems  Musculoskeletal:  Positive for back pain.   Physical Exam Updated Vital Signs BP 114/75 (BP Location: Right Arm)   Pulse (!) 106   Temp 98.1 F (36.7 C) (Oral)   Resp 18   LMP 10/25/2020 (Exact Date) Comment: BTL  SpO2 100%   Physical Exam Constitutional:      Appearance: Normal appearance.  Cardiovascular:     Rate and Rhythm: Regular rhythm. Tachycardia present.  Pulmonary:     Effort: Pulmonary effort is normal.     Breath sounds: Normal breath sounds.  Abdominal:     Tenderness: There is no abdominal tenderness.  Musculoskeletal:        General: Tenderness present.     Lumbar back: Tenderness present. No swelling, edema or signs of trauma.  Neurological:     Mental Status: She is oriented to person, place, and time.     Motor: Weakness present.    ED Results / Procedures / Treatments   Labs (all labs ordered are listed, but only abnormal results are displayed) Labs Reviewed - No data to display  EKG None  Radiology No results found.  Procedures Procedures   Medications Ordered in ED Medications - No data to display  ED Course  I have reviewed the triage vital signs and the nursing notes.  Pertinent labs & imaging results that were available during my care of the patient were reviewed by me and considered in my medical decision making (see chart for details).    MDM Rules/Calculators/A&P                           Patient presenting with flare of chronic back pain, no new red flag or focal symptoms. Teary on exam,. Has plans to follow up with her surgeon next week. Will prescribe 10 pills of Hydrocodone until she  is able to see her regular doctor.   Final Clinical Impression(s) / ED Diagnoses Final diagnoses:  Chronic low back pain with sciatica, sciatica laterality unspecified, unspecified back pain laterality  Chronic low back pain, unspecified back pain laterality, unspecified whether sciatica present  Low back pain, unspecified back pain laterality, unspecified chronicity, unspecified whether sciatica present    Rx / DC Orders ED Discharge Orders          Ordered    HYDROcodone-acetaminophen (NORCO/VICODIN) 5-325 MG tablet  Every 6 hours PRN        11/07/20 1031  Scarlett Presto, MD 11/07/20 1054    Lacretia Leigh, MD 11/08/20 [phone]

## 2020-11-09 ENCOUNTER — Telehealth: Payer: Self-pay | Admitting: Orthopaedic Surgery

## 2020-11-09 NOTE — Telephone Encounter (Signed)
Medical records mailed to patient on release form(patient has not picked up-ready since 8/4)

## 2020-11-10 ENCOUNTER — Other Ambulatory Visit: Payer: Self-pay | Admitting: Orthopaedic Surgery

## 2020-11-11 LAB — SARS CORONAVIRUS 2 (TAT 6-24 HRS): SARS Coronavirus 2: NEGATIVE

## 2020-11-14 ENCOUNTER — Observation Stay (HOSPITAL_COMMUNITY)
Admission: RE | Admit: 2020-11-14 | Discharge: 2020-11-15 | Disposition: A | Discharge status: Home / Self Care | Payer: Medicaid Other | Attending: Orthopaedic Surgery | Admitting: Orthopaedic Surgery

## 2020-11-14 ENCOUNTER — Encounter (HOSPITAL_COMMUNITY)
Admission: RE | Disposition: A | Discharge status: Home / Self Care | Payer: Self-pay | Source: Home / Self Care | Attending: Orthopaedic Surgery

## 2020-11-14 ENCOUNTER — Other Ambulatory Visit: Payer: Self-pay

## 2020-11-14 ENCOUNTER — Ambulatory Visit (HOSPITAL_COMMUNITY): Payer: Medicaid Other | Admitting: Certified Registered Nurse Anesthetist

## 2020-11-14 ENCOUNTER — Ambulatory Visit (HOSPITAL_COMMUNITY): Payer: Medicaid Other

## 2020-11-14 ENCOUNTER — Encounter (HOSPITAL_COMMUNITY): Payer: Self-pay | Admitting: Orthopaedic Surgery

## 2020-11-14 DIAGNOSIS — M5116 Intervertebral disc disorders with radiculopathy, lumbar region: Secondary | ICD-10-CM | POA: Diagnosis present

## 2020-11-14 DIAGNOSIS — F17290 Nicotine dependence, other tobacco product, uncomplicated: Secondary | ICD-10-CM | POA: Insufficient documentation

## 2020-11-14 DIAGNOSIS — M5126 Other intervertebral disc displacement, lumbar region: Secondary | ICD-10-CM | POA: Diagnosis not present

## 2020-11-14 DIAGNOSIS — E119 Type 2 diabetes mellitus without complications: Secondary | ICD-10-CM | POA: Diagnosis not present

## 2020-11-14 DIAGNOSIS — M48061 Spinal stenosis, lumbar region without neurogenic claudication: Secondary | ICD-10-CM | POA: Insufficient documentation

## 2020-11-14 DIAGNOSIS — Z419 Encounter for procedure for purposes other than remedying health state, unspecified: Secondary | ICD-10-CM

## 2020-11-14 DIAGNOSIS — Z7984 Long term (current) use of oral hypoglycemic drugs: Secondary | ICD-10-CM | POA: Insufficient documentation

## 2020-11-14 HISTORY — PX: LUMBAR LAMINECTOMY: SHX95

## 2020-11-14 LAB — GLUCOSE, CAPILLARY
Glucose-Capillary: 101 mg/dL — ABNORMAL HIGH (ref 70–99)
Glucose-Capillary: 89 mg/dL (ref 70–99)
Glucose-Capillary: 114 mg/dL — ABNORMAL HIGH (ref 70–99)
Glucose-Capillary: 204 mg/dL — ABNORMAL HIGH (ref 70–99)
Glucose-Capillary: 303 mg/dL — ABNORMAL HIGH (ref 70–99)

## 2020-11-14 LAB — POCT PREGNANCY, URINE: Preg Test, Ur: NEGATIVE

## 2020-11-14 SURGERY — MICRODISCECTOMY LUMBAR LAMINECTOMY
Anesthesia: General | Site: Spine Lumbar | Laterality: Left

## 2020-11-14 MED ORDER — FENTANYL CITRATE (PF) 100 MCG/2ML IJ SOLN
25.0000 ug | INTRAMUSCULAR | Status: DC | PRN
  Administered 2020-11-14: 50 ug via INTRAVENOUS

## 2020-11-14 MED ORDER — FENTANYL CITRATE (PF) 250 MCG/5ML IJ SOLN
INTRAMUSCULAR | Status: DC | PRN
  Administered 2020-11-14: 50 ug via INTRAVENOUS
  Administered 2020-11-14: 150 ug via INTRAVENOUS
  Administered 2020-11-14: 50 ug via INTRAVENOUS

## 2020-11-14 MED ORDER — LACTATED RINGERS IV SOLN: INTRAVENOUS | Status: DC

## 2020-11-14 MED ORDER — ACETAMINOPHEN 325 MG PO TABS
650.0000 mg | ORAL_TABLET | ORAL | Status: DC | PRN
  Administered 2020-11-14 – 2020-11-15 (×3): 650 mg via ORAL
  Filled 2020-11-14 (×3): qty 2

## 2020-11-14 MED ORDER — ROCURONIUM BROMIDE 10 MG/ML (PF) SYRINGE
PREFILLED_SYRINGE | INTRAVENOUS | Status: AC
  Filled 2020-11-14: qty 20

## 2020-11-14 MED ORDER — PHENOL 1.4 % MT LIQD: 1.0000 | OROMUCOSAL | Status: DC | PRN

## 2020-11-14 MED ORDER — ACETAMINOPHEN 650 MG RE SUPP: 650.0000 mg | RECTAL | Status: DC | PRN

## 2020-11-14 MED ORDER — MENTHOL 3 MG MT LOZG: 1.0000 | LOZENGE | OROMUCOSAL | Status: DC | PRN

## 2020-11-14 MED ORDER — PROPOFOL 10 MG/ML IV BOLUS
INTRAVENOUS | Status: AC
  Filled 2020-11-14: qty 20

## 2020-11-14 MED ORDER — CEFAZOLIN SODIUM-DEXTROSE 2-4 GM/100ML-% IV SOLN
2.0000 g | INTRAVENOUS | Status: AC
  Administered 2020-11-14: 2 g via INTRAVENOUS
  Filled 2020-11-14: qty 100

## 2020-11-14 MED ORDER — SODIUM CHLORIDE 0.9% FLUSH: 3.0000 mL | Freq: Two times a day (BID) | INTRAVENOUS | Status: DC

## 2020-11-14 MED ORDER — METHOCARBAMOL 500 MG PO TABS: 500.0000 mg | ORAL_TABLET | Freq: Four times a day (QID) | ORAL | 0 refills | Status: DC | PRN

## 2020-11-14 MED ORDER — INSULIN ASPART 100 UNIT/ML IJ SOLN
0.0000 [IU] | Freq: Every day | INTRAMUSCULAR | Status: DC
  Administered 2020-11-14: 4 [IU] via SUBCUTANEOUS

## 2020-11-14 MED ORDER — METHOCARBAMOL 500 MG PO TABS
ORAL_TABLET | ORAL | Status: AC
  Administered 2020-11-14: 500 mg via ORAL
  Filled 2020-11-14: qty 1

## 2020-11-14 MED ORDER — SODIUM CHLORIDE 0.9% FLUSH: 3.0000 mL | INTRAVENOUS | Status: DC | PRN

## 2020-11-14 MED ORDER — DULAGLUTIDE 0.75 MG/0.5ML ~~LOC~~ SOAJ: 0.7500 mg | SUBCUTANEOUS | Status: DC

## 2020-11-14 MED ORDER — DEXMEDETOMIDINE (PRECEDEX) IN NS 20 MCG/5ML (4 MCG/ML) IV SYRINGE
PREFILLED_SYRINGE | INTRAVENOUS | Status: DC | PRN
  Administered 2020-11-14: 20 ug via INTRAVENOUS
  Administered 2020-11-14: 8 ug via INTRAVENOUS

## 2020-11-14 MED ORDER — 0.9 % SODIUM CHLORIDE (POUR BTL) OPTIME
TOPICAL | Status: DC | PRN
  Administered 2020-11-14: 1000 mL

## 2020-11-14 MED ORDER — LIDOCAINE 2% (20 MG/ML) 5 ML SYRINGE
INTRAMUSCULAR | Status: DC | PRN
  Administered 2020-11-14: 80 mg via INTRAVENOUS

## 2020-11-14 MED ORDER — MIDAZOLAM HCL 2 MG/2ML IJ SOLN
INTRAMUSCULAR | Status: AC
  Filled 2020-11-14: qty 2

## 2020-11-14 MED ORDER — FERROUS GLUCONATE 324 (38 FE) MG PO TABS
324.0000 mg | ORAL_TABLET | Freq: Every day | ORAL | Status: DC
  Administered 2020-11-15: 324 mg via ORAL
  Filled 2020-11-14: qty 1

## 2020-11-14 MED ORDER — FENTANYL CITRATE (PF) 100 MCG/2ML IJ SOLN
INTRAMUSCULAR | Status: AC
  Administered 2020-11-14: 50 ug via INTRAVENOUS
  Filled 2020-11-14: qty 2

## 2020-11-14 MED ORDER — OXYCODONE-ACETAMINOPHEN 7.5-325 MG PO TABS: 1.0000 | ORAL_TABLET | ORAL | 0 refills | Status: DC | PRN

## 2020-11-14 MED ORDER — ROCURONIUM BROMIDE 10 MG/ML (PF) SYRINGE
PREFILLED_SYRINGE | INTRAVENOUS | Status: DC | PRN
  Administered 2020-11-14: 20 mg via INTRAVENOUS
  Administered 2020-11-14: 50 mg via INTRAVENOUS

## 2020-11-14 MED ORDER — ONDANSETRON HCL 4 MG/2ML IJ SOLN
INTRAMUSCULAR | Status: AC
  Filled 2020-11-14: qty 4

## 2020-11-14 MED ORDER — POLYETHYLENE GLYCOL 3350 17 G PO PACK: 17.0000 g | PACK | Freq: Every day | ORAL | Status: DC | PRN

## 2020-11-14 MED ORDER — ACETAMINOPHEN 500 MG PO TABS
1000.0000 mg | ORAL_TABLET | Freq: Once | ORAL | Status: AC
  Administered 2020-11-14: 1000 mg via ORAL
  Filled 2020-11-14: qty 2

## 2020-11-14 MED ORDER — DEXAMETHASONE SODIUM PHOSPHATE 10 MG/ML IJ SOLN
INTRAMUSCULAR | Status: DC | PRN
  Administered 2020-11-14: 10 mg via INTRAVENOUS

## 2020-11-14 MED ORDER — METHOCARBAMOL 500 MG PO TABS
500.0000 mg | ORAL_TABLET | Freq: Four times a day (QID) | ORAL | Status: DC | PRN
  Administered 2020-11-14 – 2020-11-15 (×3): 500 mg via ORAL
  Filled 2020-11-14 (×3): qty 1

## 2020-11-14 MED ORDER — ORAL CARE MOUTH RINSE: 15.0000 mL | Freq: Once | OROMUCOSAL | Status: AC

## 2020-11-14 MED ORDER — DEXAMETHASONE SODIUM PHOSPHATE 10 MG/ML IJ SOLN
INTRAMUSCULAR | Status: AC
  Filled 2020-11-14: qty 1

## 2020-11-14 MED ORDER — ONDANSETRON HCL 4 MG/2ML IJ SOLN
INTRAMUSCULAR | Status: DC | PRN
  Administered 2020-11-14: 4 mg via INTRAVENOUS

## 2020-11-14 MED ORDER — LIDOCAINE 2% (20 MG/ML) 5 ML SYRINGE
INTRAMUSCULAR | Status: AC
  Filled 2020-11-14: qty 10

## 2020-11-14 MED ORDER — PROPOFOL 10 MG/ML IV BOLUS
INTRAVENOUS | Status: DC | PRN
  Administered 2020-11-14: 50 mg via INTRAVENOUS
  Administered 2020-11-14: 150 mg via INTRAVENOUS

## 2020-11-14 MED ORDER — HYDROMORPHONE HCL 1 MG/ML IJ SOLN
0.5000 mg | INTRAMUSCULAR | Status: DC | PRN
Start: 2020-11-14 — End: 2020-11-15
  Administered 2020-11-14: 0.5 mg via INTRAMUSCULAR
  Filled 2020-11-14: qty 0.5

## 2020-11-14 MED ORDER — FENTANYL CITRATE (PF) 250 MCG/5ML IJ SOLN
INTRAMUSCULAR | Status: AC
  Filled 2020-11-14: qty 5

## 2020-11-14 MED ORDER — OXYCODONE HCL 5 MG PO TABS
5.0000 mg | ORAL_TABLET | ORAL | Status: DC | PRN
  Administered 2020-11-14 – 2020-11-15 (×4): 10 mg via ORAL
  Filled 2020-11-14 (×4): qty 2

## 2020-11-14 MED ORDER — DOCUSATE SODIUM 100 MG PO CAPS
100.0000 mg | ORAL_CAPSULE | Freq: Two times a day (BID) | ORAL | Status: DC
  Administered 2020-11-14 – 2020-11-15 (×2): 100 mg via ORAL
  Filled 2020-11-14 (×2): qty 1

## 2020-11-14 MED ORDER — SODIUM CHLORIDE 0.9 % IV SOLN: INTRAVENOUS | Status: DC

## 2020-11-14 MED ORDER — BUPIVACAINE HCL (PF) 0.25 % IJ SOLN
INTRAMUSCULAR | Status: DC | PRN
  Administered 2020-11-14: 10 mL

## 2020-11-14 MED ORDER — BUPIVACAINE HCL (PF) 0.25 % IJ SOLN
INTRAMUSCULAR | Status: AC
  Filled 2020-11-14: qty 30

## 2020-11-14 MED ORDER — METHOCARBAMOL 1000 MG/10ML IJ SOLN
500.0000 mg | Freq: Four times a day (QID) | INTRAVENOUS | Status: DC | PRN
  Filled 2020-11-14: qty 5

## 2020-11-14 MED ORDER — GLIPIZIDE 5 MG PO TABS
20.0000 mg | ORAL_TABLET | Freq: Two times a day (BID) | ORAL | Status: DC
Start: 2020-11-14 — End: 2020-11-15
  Administered 2020-11-14 – 2020-11-15 (×2): 20 mg via ORAL
  Filled 2020-11-14 (×2): qty 4

## 2020-11-14 MED ORDER — AMISULPRIDE (ANTIEMETIC) 5 MG/2ML IV SOLN: 10.0000 mg | Freq: Once | INTRAVENOUS | Status: DC | PRN

## 2020-11-14 MED ORDER — ONDANSETRON HCL 4 MG PO TABS: 4.0000 mg | ORAL_TABLET | Freq: Four times a day (QID) | ORAL | Status: DC | PRN

## 2020-11-14 MED ORDER — ONDANSETRON HCL 4 MG/2ML IJ SOLN: 4.0000 mg | Freq: Four times a day (QID) | INTRAMUSCULAR | Status: DC | PRN

## 2020-11-14 MED ORDER — CHLORHEXIDINE GLUCONATE 0.12 % MT SOLN
OROMUCOSAL | Status: AC
  Administered 2020-11-14: 15 mL via OROMUCOSAL
  Filled 2020-11-14: qty 15

## 2020-11-14 MED ORDER — PHENYLEPHRINE 40 MCG/ML (10ML) SYRINGE FOR IV PUSH (FOR BLOOD PRESSURE SUPPORT)
PREFILLED_SYRINGE | INTRAVENOUS | Status: DC | PRN
  Administered 2020-11-14 (×2): 80 ug via INTRAVENOUS

## 2020-11-14 MED ORDER — PREGABALIN 75 MG PO CAPS
150.0000 mg | ORAL_CAPSULE | Freq: Two times a day (BID) | ORAL | Status: DC
  Administered 2020-11-14 – 2020-11-15 (×2): 150 mg via ORAL
  Filled 2020-11-14 (×2): qty 2

## 2020-11-14 MED ORDER — MIDAZOLAM HCL 2 MG/2ML IJ SOLN
INTRAMUSCULAR | Status: DC | PRN
  Administered 2020-11-14: 2 mg via INTRAVENOUS

## 2020-11-14 MED ORDER — CHLORHEXIDINE GLUCONATE 0.12 % MT SOLN: 15.0000 mL | Freq: Once | OROMUCOSAL | Status: AC

## 2020-11-14 MED ORDER — HEMOSTATIC AGENTS (NO CHARGE) OPTIME
TOPICAL | Status: DC | PRN
  Administered 2020-11-14: 1 via TOPICAL

## 2020-11-14 MED ORDER — SODIUM CHLORIDE 0.9 % IV SOLN: 250.0000 mL | INTRAVENOUS | Status: DC

## 2020-11-14 MED ORDER — EMPAGLIFLOZIN 10 MG PO TABS
10.0000 mg | ORAL_TABLET | Freq: Every day | ORAL | Status: DC
  Administered 2020-11-15: 10 mg via ORAL
  Filled 2020-11-14: qty 1

## 2020-11-14 MED ORDER — CEFAZOLIN SODIUM-DEXTROSE 2-3 GM-%(50ML) IV SOLR: INTRAVENOUS | Status: DC | PRN

## 2020-11-14 MED ORDER — INSULIN ASPART 100 UNIT/ML IJ SOLN: 0.0000 [IU] | Freq: Three times a day (TID) | INTRAMUSCULAR | Status: DC

## 2020-11-14 SURGICAL SUPPLY — 56 items
ADH SKN CLS APL DERMABOND .7 (GAUZE/BANDAGES/DRESSINGS) ×1
AGENT HMST KT MTR STRL THRMB (HEMOSTASIS) ×1
BAG COUNTER SPONGE SURGICOUNT (BAG) ×2 IMPLANT
BAG SPNG CNTER NS LX DISP (BAG) ×1
BUR ROUND FLUTED 4 SOFT TCH (BURR) ×2 IMPLANT
CANISTER SUCT 3000ML PPV (MISCELLANEOUS) ×2 IMPLANT
COVER SURGICAL LIGHT HANDLE (MISCELLANEOUS) ×2 IMPLANT
DERMABOND ADVANCED (GAUZE/BANDAGES/DRESSINGS) ×1
DERMABOND ADVANCED .7 DNX12 (GAUZE/BANDAGES/DRESSINGS) ×1 IMPLANT
DRAPE MICROSCOPE LEICA (MISCELLANEOUS) ×2 IMPLANT
DRSG MEPILEX BORDER 4X4 (GAUZE/BANDAGES/DRESSINGS) IMPLANT
DRSG MEPILEX BORDER 4X8 (GAUZE/BANDAGES/DRESSINGS) IMPLANT
DRSG PAD ABDOMINAL 8X10 ST (GAUZE/BANDAGES/DRESSINGS) ×2 IMPLANT
DRSG XEROFORM 1X8 (GAUZE/BANDAGES/DRESSINGS) ×2 IMPLANT
DURAPREP 26ML APPLICATOR (WOUND CARE) ×2 IMPLANT
DURASEAL SPINE SEALANT 3ML (MISCELLANEOUS) IMPLANT
ELECT BLADE 4.0 EZ CLEAN MEGAD (MISCELLANEOUS) ×2
ELECT REM PT RETURN 9FT ADLT (ELECTROSURGICAL) ×2
ELECTRODE BLDE 4.0 EZ CLN MEGD (MISCELLANEOUS) ×1 IMPLANT
ELECTRODE REM PT RTRN 9FT ADLT (ELECTROSURGICAL) ×1 IMPLANT
GAUZE 4X4 16PLY ~~LOC~~+RFID DBL (SPONGE) ×2 IMPLANT
GAUZE SPONGE 4X4 12PLY STRL (GAUZE/BANDAGES/DRESSINGS) ×2 IMPLANT
GAUZE SPONGE 4X4 12PLY STRL LF (GAUZE/BANDAGES/DRESSINGS) ×2 IMPLANT
GLOVE SRG 8 PF TXTR STRL LF DI (GLOVE) ×4 IMPLANT
GLOVE SURG ORTHO LTX SZ7.5 (GLOVE) ×8 IMPLANT
GLOVE SURG UNDER POLY LF SZ7 (GLOVE) ×4 IMPLANT
GLOVE SURG UNDER POLY LF SZ8 (GLOVE) ×8
GOWN STRL REUS W/ TWL LRG LVL3 (GOWN DISPOSABLE) ×1 IMPLANT
GOWN STRL REUS W/ TWL XL LVL3 (GOWN DISPOSABLE) ×2 IMPLANT
GOWN STRL REUS W/TWL 2XL LVL3 (GOWN DISPOSABLE) ×2 IMPLANT
GOWN STRL REUS W/TWL LRG LVL3 (GOWN DISPOSABLE) ×2
GOWN STRL REUS W/TWL XL LVL3 (GOWN DISPOSABLE) ×4
KIT BASIN OR (CUSTOM PROCEDURE TRAY) ×2 IMPLANT
KIT TURNOVER KIT B (KITS) ×2 IMPLANT
NEEDLE SPNL 18GX3.5 QUINCKE PK (NEEDLE) ×2 IMPLANT
NS IRRIG 1000ML POUR BTL (IV SOLUTION) ×2 IMPLANT
PACK LAMINECTOMY ORTHO (CUSTOM PROCEDURE TRAY) ×2 IMPLANT
PAD ARMBOARD 7.5X6 YLW CONV (MISCELLANEOUS) ×4 IMPLANT
PATTIES SURGICAL .5 X.5 (GAUZE/BANDAGES/DRESSINGS) IMPLANT
PATTIES SURGICAL .75X.75 (GAUZE/BANDAGES/DRESSINGS) IMPLANT
SPONGE T-LAP 18X18 ~~LOC~~+RFID (SPONGE) ×2 IMPLANT
SPONGE T-LAP 4X18 ~~LOC~~+RFID (SPONGE) ×2 IMPLANT
STAPLER VISISTAT 35W (STAPLE) ×2 IMPLANT
SURGIFLO W/THROMBIN 8M KIT (HEMOSTASIS) ×2 IMPLANT
SUT VIC AB 0 CT1 27 (SUTURE) ×2
SUT VIC AB 0 CT1 27XBRD ANBCTR (SUTURE) ×1 IMPLANT
SUT VIC AB 1 CTX 27 (SUTURE) ×2 IMPLANT
SUT VIC AB 1 CTX 36 (SUTURE) ×2
SUT VIC AB 1 CTX36XBRD ANBCTR (SUTURE) ×1 IMPLANT
SUT VIC AB 2-0 CT1 27 (SUTURE) ×2
SUT VIC AB 2-0 CT1 TAPERPNT 27 (SUTURE) ×1 IMPLANT
SUT VIC AB 3-0 X1 27 (SUTURE) IMPLANT
SYR 20ML ECCENTRIC (SYRINGE) IMPLANT
TOWEL GREEN STERILE (TOWEL DISPOSABLE) ×2 IMPLANT
TOWEL GREEN STERILE FF (TOWEL DISPOSABLE) ×2 IMPLANT
WATER STERILE IRR 1000ML POUR (IV SOLUTION) ×2 IMPLANT

## 2020-11-14 NOTE — Transfer of Care (Signed)
Immediate Anesthesia Transfer of Care Note  Patient: Melinda Stone  Procedure(s) Performed: LEFT LUMBAR FOUR-FIVE MICRODISCECTOMY (Left: Spine Lumbar)  Patient Location: PACU  Anesthesia Type:General  Level of Consciousness: drowsy, patient cooperative and responds to stimulation  Airway & Oxygen Therapy: Patient Spontanous Breathing  Post-op Assessment: Report given to RN and Post -op Vital signs reviewed and stable  Post vital signs: Reviewed and stable  Last Vitals:  Vitals Value Taken Time  BP 119/71 11/14/20 1438  Temp    Pulse 98 11/14/20 1441  Resp 14 11/14/20 1441  SpO2 100 % 11/14/20 1441  Vitals shown include unvalidated device data.  Last Pain:  Vitals:   11/14/20 0939  TempSrc:   PainSc: 9       Patients Stated Pain Goal: 2 (11/14/20 0939)  Complications: No notable events documented.

## 2020-11-14 NOTE — Anesthesia Procedure Notes (Signed)
Procedure Name: Intubation Date/Time: 11/14/2020 12:39 PM Performed by: Janace Litten, CRNA Pre-anesthesia Checklist: Patient identified, Emergency Drugs available, Suction available and Patient being monitored Patient Re-evaluated:Patient Re-evaluated prior to induction Oxygen Delivery Method: Circle System Utilized Preoxygenation: Pre-oxygenation with 100% oxygen Induction Type: IV induction Ventilation: Mask ventilation without difficulty Laryngoscope Size: Mac and 3 Grade View: Grade I Tube type: Oral Tube size: 7.0 mm Number of attempts: 1 Airway Equipment and Method: Stylet and Oral airway Placement Confirmation: ETT inserted through vocal cords under direct vision, positive ETCO2 and breath sounds checked- equal and bilateral Secured at: 21 cm Tube secured with: Tape Dental Injury: Teeth and Oropharynx as per pre-operative assessment

## 2020-11-14 NOTE — Discharge Instructions (Signed)
Ok to shower 5 days postop.  Do not apply any creams or ointments to incision.    Can use 4x4 gauze and tape for dressing changes.  Staples will be removed 2 week postop appointment   No aggressive activity.  No bending, squatting, twisting or prolonged sitting.  Mostly be in reclined position or lying down.     No driving

## 2020-11-14 NOTE — H&P (Addendum)
Melinda Stone is an 40 y.o. female.   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  States that symptoms unchanged in previous visit.  She is want to proceed with left L4-5 microdiscectomy as scheduled.  Patient states that she is having bilateral lower extremity radicular symptoms  Past Medical History:  Diagnosis Date   Anemia    Arthritis 08/17/2020   Chronic back pain    Diabetes mellitus without complication (HCC)    Ovarian cyst    Pneumonia    Polyneuropathy     Past Surgical History:  Procedure Laterality Date   CESAREAN SECTION     CHOLECYSTECTOMY  2004   LUMBAR LAMINECTOMY N/A 05/30/2020   Procedure: Lumbar five Laminectomy,  Bilateral Microdiscectomy, Left Lumbar five -Sacral one Microdiscectomy;  Surgeon: Marybelle Killings, MD;  Location: [place];  Service: Orthopedics;  Laterality: N/A;   TUBAL LIGATION  10/19/2010    Family History  Problem Relation Age of Onset   Diabetes Mother    Hypertension Mother    Stroke Mother    Social History:  reports that she has been smoking cigars. She has never used smokeless tobacco. She reports that she does not currently use alcohol. She reports that she does not currently use drugs after having used the following drugs: Marijuana.  Allergies: No Known Allergies  Medications Prior to Admission  Medication Sig Dispense Refill   acetaminophen (TYLENOL) 650 MG CR tablet Take 1,300 mg by mouth 2 (two) times daily.     empagliflozin (JARDIANCE) 10 MG TABS tablet Take 1 tablet (10 mg total) by mouth daily. 30 tablet 6   ferrous gluconate (FERGON) 324 MG tablet Take 1 tablet (324 mg total) by mouth daily with breakfast. 90 tablet 1   glipiZIDE (GLUCOTROL) 10 MG tablet Take 2 tablets (20 mg total) by mouth 2 (two) times daily before a meal. 360 tablet 3   HYDROcodone-acetaminophen  (NORCO/VICODIN) 5-325 MG tablet Take 1 tablet by mouth every 6 (six) hours as needed for moderate pain. 10 tablet 0   pregabalin (LYRICA) 150 MG capsule Take 150 mg by mouth 2 (two) times daily.     TRULICITY 6.96 VE/9.3YB SOPN Inject 0.75 mg into the skin once a week.     Blood Glucose Monitoring Suppl (ACCU-CHEK GUIDE ME) w/Device KIT Use as instructed to check blood sugar daily 1 kit 0   glucose blood (ACCU-CHEK GUIDE) test strip Use as instructed to check blood sugar 2 times daily 100 each 11    Results for orders placed or performed during the hospital encounter of 11/14/20 (from the past 48 hour(s))  Glucose, capillary     Status: Abnormal   Collection Time: 11/14/20  9:30 AM  Result Value Ref Range   Glucose-Capillary 101 (H) 70 - 99 mg/dL    Comment: Glucose reference range applies only to samples taken after fasting for at least 8 hours.  Pregnancy, urine POC     Status: None   Collection Time: 11/14/20  9:34 AM  Result Value Ref Range   Preg Test, Ur NEGATIVE NEGATIVE    Comment:        THE SENSITIVITY OF THIS METHODOLOGY IS >24 mIU/mL    No results found.  Review of Systems  Constitutional:  Positive for activity change.  HENT: Negative.    Respiratory: Negative.    Cardiovascular: Negative.  Gastrointestinal: Negative.   Musculoskeletal:  Positive for back pain and gait problem.  Neurological:  Positive for numbness.  Psychiatric/Behavioral: Negative.     Blood pressure 124/85, pulse (!) 103, temperature 97.6 F (36.4 C), temperature source Oral, resp. rate 18, height _0  (1.753 m), weight 88.5 kg, last menstrual period 10/25/2020, SpO2 100 %. Physical Exam HENT:     Head: Normocephalic and atraumatic.  Eyes:     Extraocular Movements: Extraocular movements intact.  Cardiovascular:     Rate and Rhythm: Regular rhythm.     Heart sounds: Normal heart sounds.  Pulmonary:     Effort: No respiratory distress.     Breath sounds: Normal breath sounds.   Musculoskeletal:        General: Tenderness present.  Neurological:     Mental Status: She is oriented to person, place, and time.  Psychiatric:        Mood and Affect: Mood normal.     Assessment/Plan Left L4-5 HNP Recurrent   Will proceed with left L4-5 microdiscectomy  for recurrent HNP as scheduled.  All questions answered.    Benjiman Core, PA-C 11/14/2020, 10:38 AM

## 2020-11-14 NOTE — Anesthesia Postprocedure Evaluation (Signed)
Anesthesia Post Note  Patient: Melinda Stone  Procedure(s) Performed: LEFT LUMBAR FOUR-FIVE MICRODISCECTOMY (Left: Spine Lumbar)     Patient location during evaluation: PACU Anesthesia Type: General Level of consciousness: awake and alert Pain management: pain level controlled Vital Signs Assessment: post-procedure vital signs reviewed and stable Respiratory status: spontaneous breathing, nonlabored ventilation, respiratory function stable and patient connected to nasal cannula oxygen Cardiovascular status: blood pressure returned to baseline and stable Postop Assessment: no apparent nausea or vomiting Anesthetic complications: no   No notable events documented.  Last Vitals:  Vitals:   11/14/20 1545 11/14/20 1627  BP: 104/69 116/74  Pulse: 99 (!) 102  Resp: 16 20  Temp: 36.9 C 36.9 C  SpO2: 100% 100%    Last Pain:  Vitals:   11/14/20 1627  TempSrc: Oral  PainSc: 2                  Kennieth Rad

## 2020-11-14 NOTE — Op Note (Addendum)
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  Postop diagnosis: Same  Procedure: Reexploration left L4-5 with removal of large free fragment, Microdiscectomy.   Surgeon: Annell Greening, MD  Assistant: Zonia Kief, PA-C medically necessary and present for the entire procedure  Anesthesia General plus Marcaine skin local  EBL less than 100 cc  Findings: Large free fragment L4-5 left  Complications: None  Brief history: 40 year old female had surgery 05/30/2020 with removal of very large fragment and L4-5 central with bilateral microdiscectomy at L4-5 and left microdiscectomy L5-S1.  She did well for 2 months was ambulatory did not need to use a walker and was at 1 point in a wheelchair preoperatively.  She was amatory for 2 months and then had recurrence of pain multiple trips to emergency room and MRI imaging with and without contrast showed a very large recurrent fragment at L4-5 with minimal bulge at L5-S1 left.  She was brought in for recurrent microdiscectomy at L4-5 with her significant pain and weakness.  Procedure: After induction general anesthesia orotracheal ovation prone positioning arms at 9090 careful padding yellow foam pads anteriorly over the shoulders back was prepped with DuraPrep area squared with towel sterile skin marker on the old incision and Betadine Steri-Drape laminectomy sheets and drapes timeout procedures completed Ancef prophylaxis.  #'s been placed prior to applying the Betadine Steri-Drape and incision was made.  Sharp dissection down to the top of the spinous process at L4 which was still present and then falling out to the lamina subperiosteally out to the facet joint.  Lamina was thinned and operative microscope was draped brought in and we followed the lateral intact dura down laterally removing bone laterally.  2 spine x-rays were taken initially we were at the top of the foramina the next was at the middle to lower portion of the  foramina and third x-ray was with Penfield 4 sticking inside the disc space at L4-5.  Made several passes with micropituitary and removed some pieces and then finally a large massive fragment half the size of the thumb came out.  Some remaining pieces were removed inside the disc using straight, micropituitary's, up biting micropituitary straight regular pituitaries.  Bone is removed at the level of the pedicle of nerve root was free as it went out around the pedicle and into the foramina.  Hockey-stick could be paced anterior of there is no areas of compression left.  Large fragment was sent for gross pathologic description only since patient had already had 1 large massive fragment removed in March.  Dura was well decompressed.  Copious irrigation.  Surgiflo been used in the gutters and also bipolar cautery for epidural veins as needed.  Dura was intact copious irrigation #1 Vicryl closure 2 on subtenons tissue skin staple closure postop dressing and transfer the care room after Marcaine infiltration for postoperative analgesia.  Patient tolerated the procedure well.

## 2020-11-14 NOTE — Anesthesia Preprocedure Evaluation (Signed)
Anesthesia Evaluation  Patient identified by MRN, date of birth, ID band Patient awake    Reviewed: Allergy & Precautions, NPO status , Patient's Chart, lab work & pertinent test results  Airway Mallampati: II  TM Distance: >3 FB Neck ROM: Full    Dental  (+) Poor Dentition, Dental Advisory Given, Missing   Pulmonary pneumonia, Current Smoker and Patient abstained from smoking.,    Pulmonary exam normal breath sounds clear to auscultation       Cardiovascular negative cardio ROS Normal cardiovascular exam Rhythm:Regular Rate:Normal     Neuro/Psych  Neuromuscular disease    GI/Hepatic negative GI ROS, Neg liver ROS,   Endo/Other  diabetes, Type 2, Oral Hypoglycemic Agents  Renal/GU negative Renal ROS     Musculoskeletal negative musculoskeletal ROS (+)   Abdominal   Peds  Hematology  (+) Blood dyscrasia, anemia ,   Anesthesia Other Findings   Reproductive/Obstetrics                             Lab Results  Component Value Date   WBC 8.7 11/04/2020   HGB 10.7 (L) 11/04/2020   HCT 30.9 (L) 11/04/2020   MCV 84.2 11/04/2020   PLT 201 11/04/2020   Lab Results  Component Value Date   CREATININE 0.45 11/04/2020   BUN 7 11/04/2020   NA 136 11/04/2020   K 4.0 11/04/2020   CL 106 11/04/2020   CO2 24 11/04/2020    Anesthesia Physical  Anesthesia Plan  ASA: 2  Anesthesia Plan: General   Post-op Pain Management:    Induction: Intravenous  PONV Risk Score and Plan: 3 and Ondansetron, Dexamethasone, Treatment may vary due to age or medical condition and Midazolam  Airway Management Planned: Oral ETT  Additional Equipment: None  Intra-op Plan:   Post-operative Plan: Extubation in OR  Informed Consent: I have reviewed the patients History and Physical, chart, labs and discussed the procedure including the risks, benefits and alternatives for the proposed anesthesia with the  patient or authorized representative who has indicated his/her understanding and acceptance.     Dental advisory given  Plan Discussed with: CRNA  Anesthesia Plan Comments:         Anesthesia Quick Evaluation

## 2020-11-14 NOTE — Interval H&P Note (Signed)
History and Physical Interval Note:  11/14/2020 12:23 PM  Melinda Stone  has presented today for surgery, with the diagnosis of recurrent herniated nucleus pulposus Left L4-5.  The various methods of treatment have been discussed with the patient and family. After consideration of risks, benefits and other options for treatment, the patient has consented to  Procedure(s): LEFT LUMBAR FOUR-FIVE MICRODISCECTOMY (Left) as a surgical intervention.  The patient's history has been reviewed, patient examined, no change in status, stable for surgery.  I have reviewed the patient's chart and labs.  Questions were answered to the patient's satisfaction.     [name]

## 2020-11-15 ENCOUNTER — Telehealth: Payer: Self-pay | Admitting: Orthopaedic Surgery

## 2020-11-15 ENCOUNTER — Encounter (HOSPITAL_COMMUNITY): Payer: Self-pay | Admitting: Orthopaedic Surgery

## 2020-11-15 DIAGNOSIS — M5116 Intervertebral disc disorders with radiculopathy, lumbar region: Secondary | ICD-10-CM | POA: Diagnosis not present

## 2020-11-15 LAB — GLUCOSE, CAPILLARY: Glucose-Capillary: 91 mg/dL (ref 70–99)

## 2020-11-15 NOTE — Telephone Encounter (Signed)
Pt called stating she needs to have her rx that Barry Dienes called in for her yesterday authorized in order for the pharmacy to release it. Pt would like a CB when this is done please.   [phone]

## 2020-11-15 NOTE — Progress Notes (Signed)
Patient was transported via wheelchair by volunteer for discharge home; in no acute distress nor complaints of pain nor discomfort; room was checked and accounted for all her belongings; discharge instructions given to patient by RN and she verbalized understanding in the instructions given.

## 2020-11-15 NOTE — Evaluation (Signed)
Occupational Therapy Evaluation Patient Details Name: Melinda Stone MRN: [id number] DOB: 1980/08/14 Today's Date: 11/15/2020    History of Present Illness Pt is a 40 yo female s/p re-exploration left L4-5 with removal of large free fragment, Microdiscectomy. Prior back surgery 05/2020.   Clinical Impression   This 72 female admitted and underwent above presents to acute OT with all education completed, we will D/C from acute OT.    Follow Up Recommendations  No OT follow up;Supervision - Intermittent    Equipment Recommendations  3 in 1 bedside commode;Tub/shower seat       Precautions / Restrictions Precautions Precautions: Back Precaution Booklet Issued: Yes (comment) Restrictions Weight Bearing Restrictions: No      Mobility Bed Mobility Overal bed mobility: Modified Independent                  Transfers Overall transfer level: Modified independent               General transfer comment: increased time due to pain    Balance Overall balance assessment: Mild deficits observed, not formally tested                                         ADL either performed or assessed with clinical judgement   ADL Overall ADL's : Modified independent                                        Educated on all aspects of basic ADLs and mobility on post op back handout.     Vision Baseline Vision/History: 0 No visual deficits              Pertinent Vitals/Pain Pain Assessment: 0-10 Pain Score: 8  Pain Location: back Pain Descriptors / Indicators: Aching;Sore Pain Intervention(s): Limited activity within patient's tolerance;Monitored during session;Ice applied (pain meds due in one hour)     Hand Dominance Right   Extremity/Trunk Assessment Upper Extremity Assessment Upper Extremity Assessment: Overall WFL for tasks assessed           Communication Communication Communication: No difficulties   Cognition  Arousal/Alertness: Awake/alert Behavior During Therapy: WFL for tasks assessed/performed Overall Cognitive Status: Within Functional Limits for tasks assessed                                                Home Living Family/patient expects to be discharged to:: Private residence Living Arrangements: Children Available Help at Discharge: Family;Available 24 hours/day Type of Home: Apartment Home Access: Stairs to enter Entrance Stairs-Number of Steps: 1 Entrance Stairs-Rails: Right Home Layout: Able to live on main level with bedroom/bathroom     Bathroom Shower/Tub: Tub/shower unit;Curtain   Bathroom Toilet: Standard     Home Equipment: None          Prior Functioning/Environment Level of Independence: Independent                 OT Problem List: Decreased range of motion;Pain         OT Goals(Current goals can be found in the care plan section) Acute Rehab OT Goals Patient Stated Goal: home today and for pain to be better  AM-PAC OT "6 Clicks" Daily Activity     Outcome Measure Help from another person eating meals?: None Help from another person taking care of personal grooming?: None Help from another person toileting, which includes using toliet, bedpan, or urinal?: None Help from another person bathing (including washing, rinsing, drying)?: None Help from another person to put on and taking off regular upper body clothing?: None Help from another person to put on and taking off regular lower body clothing?: None 6 Click Score: 24   End of Session    Activity Tolerance: Patient tolerated treatment well Patient left:  (sitting EOB getting ready to work with PT)  OT Visit Diagnosis: Other abnormalities of gait and mobility (R26.89);Muscle weakness (generalized) (M62.81);Pain Pain - part of body:  (back)                Time: 3887-1959 OT Time Calculation (min): 22 min Charges:  OT General  Charges $OT Visit: 1 Visit OT Evaluation $OT Eval Moderate Complexity: 1 Mod  Ignacia Palma, OTR/L Acute [employer] Pager [phone] Office [phone]    Evette Georges 11/15/2020, 9:39 AM

## 2020-11-15 NOTE — Evaluation (Signed)
Physical Therapy Evaluation Patient Details Name: Melinda Stone MRN: [id number] DOB: 05/13/1980 Today's Date: 11/15/2020   History of Present Illness  Pt is a 40 yo female s/p re-exploration left L4-5 with removal of large free fragment, Microdiscectomy. Prior back surgery 05/2020.   Clinical Impression  Pt admitted with above diagnosis. At the time of PT eval, pt was able to demonstrate transfers with gross modified independence to min guard for safety with ambulation. Pt limited by pain this session. Pt was educated on precautions, positioning recommendations, appropriate activity progression, and car transfer. Pt currently with functional limitations due to the deficits listed below (see PT Problem List). Pt will benefit from skilled PT to increase their independence and safety with mobility to allow discharge to the venue listed below.      Follow Up Recommendations No PT follow up;Supervision for mobility/OOB    Equipment Recommendations  None recommended by PT    Recommendations for Other Services       Precautions / Restrictions Precautions Precautions: Back Precaution Booklet Issued: Yes (comment) Precaution Comments: Reviewed handout and pt was cued for precautions during functional mobility. Required Braces or Orthoses: Other Brace Other Brace: No brace needed order Restrictions Weight Bearing Restrictions: No      Mobility  Bed Mobility Overal bed mobility: Modified Independent             General bed mobility comments: Increased time due to pain. Able to log roll well with HOB flat and rails lowered to simulate home environment.    Transfers Overall transfer level: Modified independent Equipment used: None             General transfer comment: Increased time due to pain. Pt demonstrated good hand placement on seated surface for safety.  Ambulation/Gait Ambulation/Gait assistance: Supervision;Min guard Gait Distance (Feet): 150  Feet Assistive device: None Gait Pattern/deviations: Step-through pattern;Decreased stride length;Trunk flexed Gait velocity: Decreased Gait velocity interpretation: <1.31 ft/sec, indicative of household ambulator General Gait Details: Slow and effortful. Pt reaching out occasionally for external support, however it seemed more due to pain than needing to steady herself. Close guard provided by therapist for safety, however no overt LOB noted.  Stairs            Wheelchair Mobility    Modified Rankin (Stroke Patients Only)       Balance Overall balance assessment: Mild deficits observed, not formally tested                                           Pertinent Vitals/Pain Pain Assessment: 0-10 Pain Score: 9  Pain Location: back Pain Descriptors / Indicators: Aching;Sore Pain Intervention(s): Limited activity within patient's tolerance;Monitored during session;Repositioned;Ice applied    Home Living Family/patient expects to be discharged to:: Private residence Living Arrangements: Children Available Help at Discharge: Family;Available 24 hours/day Type of Home: Apartment Home Access: Stairs to enter Entrance Stairs-Rails: Right Entrance Stairs-Number of Steps: 1 Home Layout: Able to live on main level with bedroom/bathroom Home Equipment: None      Prior Function Level of Independence: Independent         Comments: Reports she was independent for ADLs/walking but moving slowly and in pain. Children help take care of her. Does not work, likes to read.     Hand Dominance   Dominant Hand: Right    Extremity/Trunk Assessment   Upper Extremity Assessment  Upper Extremity Assessment: Defer to OT evaluation    Lower Extremity Assessment Lower Extremity Assessment: Generalized weakness (Mild; consistent with pre-op diagnosis)    Cervical / Trunk Assessment Cervical / Trunk Assessment: Other exceptions Cervical / Trunk Exceptions: s/p  surgery  Communication   Communication: No difficulties  Cognition Arousal/Alertness: Awake/alert Behavior During Therapy: WFL for tasks assessed/performed Overall Cognitive Status: Within Functional Limits for tasks assessed                                        General Comments      Exercises     Assessment/Plan    PT Assessment Patient needs continued PT services  PT Problem List Decreased strength;Decreased activity tolerance;Decreased balance;Decreased mobility;Decreased knowledge of use of DME;Decreased safety awareness;Decreased knowledge of precautions;Pain       PT Treatment Interventions DME instruction;Gait training;Stair training;Functional mobility training;Therapeutic activities;Therapeutic exercise;Neuromuscular re-education;Patient/family education    PT Goals (Current goals can be found in the Care Plan section)  Acute Rehab PT Goals Patient Stated Goal: home today and for pain to be better PT Goal Formulation: With patient Time For Goal Achievement: 11/22/20 Potential to Achieve Goals: Good    Frequency Min 5X/week   Barriers to discharge        Co-evaluation               AM-PAC PT "6 Clicks" Mobility  Outcome Measure Help needed turning from your back to your side while in a flat bed without using bedrails?: None Help needed moving from lying on your back to sitting on the side of a flat bed without using bedrails?: None Help needed moving to and from a bed to a chair (including a wheelchair)?: A Little Help needed standing up from a chair using your arms (e.g., wheelchair or bedside chair)?: None Help needed to walk in hospital room?: A Little Help needed climbing 3-5 steps with a railing? : None 6 Click Score: 22    End of Session   Activity Tolerance: Patient limited by pain Patient left: in bed;with call bell/phone within reach Nurse Communication: Mobility status PT Visit Diagnosis: Unsteadiness on feet  (R26.81);Pain Pain - part of body:  (back)    Time: 5035-4656 PT Time Calculation (min) (ACUTE ONLY): 15 min   Charges:   PT Evaluation $PT Eval Low Complexity: 1 Low          Conni Slipper, PT, DPT Acute Rehabilitation Services Pager: [phone] Office: [phone]   Marylynn Pearson 11/15/2020, 10:33 AM

## 2020-11-15 NOTE — Telephone Encounter (Signed)
Patient aware her oxycodone has been authorized

## 2020-11-15 NOTE — Progress Notes (Signed)
Patient ID: Melinda Stone, female   DOB: 11/07/1980, 40 y.o.   MRN: [id number]   Subjective: 1 Day Post-Op Procedure(s) (LRB): LEFT LUMBAR FOUR-FIVE MICRODISCECTOMY (Left) Patient reports pain as moderate.    Objective: Vital signs in last 24 hours: Temp:  [97.6 F (36.4 C)-99.2 F (37.3 C)] 98.4 F (36.9 C) (08/30 0530) Pulse Rate:  [87-108] 98 (08/30 0601) Resp:  [10-20] 16 (08/30 0530) BP: (93-124)/(52-85) 103/67 (08/30 0601) SpO2:  [100 %] 100 % (08/30 0530) Weight:  [88.5 kg] 88.5 kg (08/29 0927)  Intake/Output from previous day: 08/29 0701 - 08/30 0700 In: 1700 [I.V.:1700] Out: 151 [Urine:1; Blood:150] Intake/Output this shift: No intake/output data recorded.  No results for input(s): HGB in the last 72 hours. No results for input(s): WBC, RBC, HCT, PLT in the last 72 hours. No results for input(s): NA, K, CL, CO2, BUN, CREATININE, GLUCOSE, CALCIUM in the last 72 hours. No results for input(s): LABPT, INR in the last 72 hours.  Ambulatory without walking aid.  DG Lumbar Spine 2-3 Views  Result Date: 11/14/2020 CLINICAL DATA:  L4-5 micro discectomy. EXAM: LUMBAR SPINE - 2-3 VIEW COMPARISON:  MRI of October 02, 2020. FINDINGS: Three intraoperative cross-table lateral projections were obtained of the lumbar spine. The first image demonstrates surgical probe and retractor at approximately the L3-4 level. Second image demonstrates surgical probe directed toward L4 vertebral body. The third image demonstrates surgical probe at the L4-5 level. IMPRESSION: Surgical localization as described above. Electronically Signed   By: Lupita Raider M.D.   On: 11/14/2020 16:33    Assessment/Plan: 1 Day Post-Op Procedure(s) (LRB): LEFT LUMBAR FOUR-FIVE MICRODISCECTOMY (Left) Plan: discharge home. Office one week.   [name] 11/15/2020, 7:45 AM

## 2020-11-16 LAB — SURGICAL PATHOLOGY

## 2020-11-18 NOTE — Discharge Summary (Signed)
Patient ID: Melinda Stone MRN: [id number] DOB/AGE: 38-Dec-1982 38 y.o.  Admit date: 11/14/2020 Discharge date: 11/18/2020  Admission Diagnoses:  Active Problems:   Recurrent herniation of lumbar disc   HNP (herniated nucleus pulposus), lumbar   Discharge Diagnoses:  Active Problems:   Recurrent herniation of lumbar disc   HNP (herniated nucleus pulposus), lumbar  status post Procedure(s): LEFT LUMBAR FOUR-FIVE MICRODISCECTOMY  Past Medical History:  Diagnosis Date   Anemia    Arthritis 08/17/2020   Chronic back pain    Diabetes mellitus without complication (HCC)    Ovarian cyst    Pneumonia    Polyneuropathy     Surgeries: Procedure(s): LEFT LUMBAR FOUR-FIVE MICRODISCECTOMY on 11/14/2020   Consultants:   Discharged Condition: Improved  Hospital Course: Melinda Stone is an 40 y.o. female who was admitted 11/14/2020 for operative treatment of lumbar HNP. Patient failed conservative treatments (please see the history and physical for the specifics) and had severe unremitting pain that affects sleep, daily activities and work/hobbies. After pre-op clearance, the patient was taken to the operating room on 11/14/2020 and underwent  Procedure(s): LEFT LUMBAR FOUR-FIVE MICRODISCECTOMY.    Patient was given perioperative antibiotics:  Anti-infectives (From admission, onward)    Start     Dose/Rate Route Frequency Ordered Stop   11/14/20 0930  ceFAZolin (ANCEF) IVPB 2g/100 mL premix        2 g 200 mL/hr over 30 Minutes Intravenous On call to O.R. 11/14/20 0919 11/14/20 1247        Patient was given sequential compression devices and early ambulation to prevent DVT.   Patient benefited maximally from hospital stay and there were no complications. At the time of discharge, the patient was urinating/moving their bowels without difficulty, tolerating a regular diet, pain is controlled with oral pain medications and they have been cleared by PT/OT.   Recent  vital signs: No data found.   Recent laboratory studies: No results for input(s): WBC, HGB, HCT, PLT, NA, K, CL, CO2, BUN, CREATININE, GLUCOSE, INR, CALCIUM in the last 72 hours.  Invalid input(s): PT, 2   Discharge Medications:   Allergies as of 11/15/2020   No Known Allergies      Medication List     STOP taking these medications    acetaminophen 650 MG CR tablet Commonly known as: TYLENOL   HYDROcodone-acetaminophen 5-325 MG tablet Commonly known as: NORCO/VICODIN       TAKE these medications    Accu-Chek Guide Me w/Device Kit Use as instructed to check blood sugar daily   Accu-Chek Guide test strip Generic drug: glucose blood Use as instructed to check blood sugar 2 times daily   empagliflozin 10 MG Tabs tablet Commonly known as: Jardiance Take 1 tablet (10 mg total) by mouth daily.   ferrous gluconate 324 MG tablet Commonly known as: FERGON Take 1 tablet (324 mg total) by mouth daily with breakfast.   glipiZIDE 10 MG tablet Commonly known as: GLUCOTROL Take 2 tablets (20 mg total) by mouth 2 (two) times daily before a meal.   methocarbamol 500 MG tablet Commonly known as: Robaxin Take 1 tablet (500 mg total) by mouth every 6 (six) hours as needed for muscle spasms.   oxyCODONE-acetaminophen 7.5-325 MG tablet Commonly known as: Percocet Take 1 tablet by mouth every 4 (four) hours as needed for severe pain.   pregabalin 150 MG capsule Commonly known as: LYRICA Take 150 mg by mouth 2 (two) times daily.   Trulicity 1.74 YC/1.4GY Sopn  Generic drug: Dulaglutide Inject 0.75 mg into the skin once a week.        Diagnostic Studies: DG Lumbar Spine 2-3 Views  Result Date: 11/14/2020 CLINICAL DATA:  L4-5 micro discectomy. EXAM: LUMBAR SPINE - 2-3 VIEW COMPARISON:  MRI of October 02, 2020. FINDINGS: Three intraoperative cross-table lateral projections were obtained of the lumbar spine. The first image demonstrates surgical probe and retractor at  approximately the L3-4 level. Second image demonstrates surgical probe directed toward L4 vertebral body. The third image demonstrates surgical probe at the L4-5 level. IMPRESSION: Surgical localization as described above. Electronically Signed   By: Marijo Conception M.D.   On: 11/14/2020 16:33    Discharge Instructions     Incentive spirometry RT   Complete by: As directed         Follow-up Information     Marybelle Killings, MD. Schedule an appointment as soon as possible for a visit today.   Specialty: Orthopedic Surgery Why: need return office visit 2 weeks postop.  call to schedule appointment. Contact information: [address] [place] [address] [phone]                 Discharge Plan:  discharge to home  Disposition:     Signed: Benjiman Core  11/18/2020, 12:44 PM

## 2020-11-23 ENCOUNTER — Encounter: Payer: Self-pay | Admitting: Surgery

## 2020-11-23 ENCOUNTER — Other Ambulatory Visit: Payer: Self-pay

## 2020-11-23 ENCOUNTER — Ambulatory Visit (INDEPENDENT_AMBULATORY_CARE_PROVIDER_SITE_OTHER): Payer: Medicaid Other | Admitting: Surgery

## 2020-11-23 VITALS — BP 102/68 | HR 196 | Ht 69.0 in | Wt 195.0 lb

## 2020-11-23 DIAGNOSIS — Z9889 Other specified postprocedural states: Secondary | ICD-10-CM

## 2020-11-28 ENCOUNTER — Other Ambulatory Visit: Payer: Self-pay

## 2020-11-28 ENCOUNTER — Ambulatory Visit (INDEPENDENT_AMBULATORY_CARE_PROVIDER_SITE_OTHER): Payer: Medicaid Other | Admitting: Surgery

## 2020-11-28 DIAGNOSIS — M5126 Other intervertebral disc displacement, lumbar region: Secondary | ICD-10-CM

## 2020-11-28 NOTE — Progress Notes (Signed)
Patient was here to have staples removed from her back.  Staples removed and steri strips placed on her incision.

## 2020-11-29 ENCOUNTER — Emergency Department (HOSPITAL_COMMUNITY)
Admission: EM | Admit: 2020-11-29 | Discharge: 2020-11-29 | Disposition: A | Discharge status: Home / Self Care | Payer: Medicaid Other | Attending: Emergency Medicine | Admitting: Emergency Medicine

## 2020-11-29 ENCOUNTER — Other Ambulatory Visit: Payer: Self-pay

## 2020-11-29 ENCOUNTER — Encounter (HOSPITAL_COMMUNITY): Payer: Self-pay

## 2020-11-29 DIAGNOSIS — Z7984 Long term (current) use of oral hypoglycemic drugs: Secondary | ICD-10-CM | POA: Insufficient documentation

## 2020-11-29 DIAGNOSIS — E1142 Type 2 diabetes mellitus with diabetic polyneuropathy: Secondary | ICD-10-CM | POA: Insufficient documentation

## 2020-11-29 DIAGNOSIS — F17290 Nicotine dependence, other tobacco product, uncomplicated: Secondary | ICD-10-CM | POA: Insufficient documentation

## 2020-11-29 DIAGNOSIS — M79604 Pain in right leg: Secondary | ICD-10-CM | POA: Diagnosis not present

## 2020-11-29 DIAGNOSIS — M79605 Pain in left leg: Secondary | ICD-10-CM | POA: Insufficient documentation

## 2020-11-29 MED ORDER — DIAZEPAM 5 MG PO TABS
5.0000 mg | ORAL_TABLET | Freq: Once | ORAL | Status: AC
  Administered 2020-11-29: 5 mg via ORAL
  Filled 2020-11-29: qty 1

## 2020-11-29 MED ORDER — OXYCODONE HCL 5 MG PO TABS
5.0000 mg | ORAL_TABLET | Freq: Once | ORAL | Status: AC
  Administered 2020-11-29: 5 mg via ORAL
  Filled 2020-11-29: qty 1

## 2020-11-29 MED ORDER — ACETAMINOPHEN 500 MG PO TABS
1000.0000 mg | ORAL_TABLET | Freq: Once | ORAL | Status: AC
  Administered 2020-11-29: 1000 mg via ORAL
  Filled 2020-11-29: qty 2

## 2020-11-29 NOTE — Discharge Instructions (Signed)
I discussed the case with one of the orthopedic surgeons in the office and they wanted you to call the office today and try to schedule an urgent appointment so they can reassess your worsening back pain after your surgery.  Please give them a call today.

## 2020-11-29 NOTE — ED Notes (Signed)
Pt ambulatory in ED room.

## 2020-11-29 NOTE — ED Triage Notes (Signed)
Pt c/o bilateral leg pain that she states has been on going for over a year. Has rx for oxy, took this morning with minimal relief. Denies any recent trauma to area.

## 2020-11-29 NOTE — ED Provider Notes (Signed)
[facility] DEPT Provider Note   CSN: [id number] Arrival date & time: 11/29/20  0825     History Chief Complaint  Patient presents with   Leg Pain    Melinda Stone is a 40 y.o. female.  40 yo F with a chief complaints of low back pain that radiates down both of her legs.  This is been an ongoing issue for greater than a year.  She recently had a L4-5 microdiscectomy that she feels like did not make any difference in her back pain and feels like its gotten mildly worse since then.  She is actually been into the clinic twice since then including yesterday to have her staples removed.  She reportedly told them about her discomfort but decided to come here today.  Denies loss of bowel or bladder denies loss of peritoneal sensation denies fevers or chills.  She does feel like her legs are a bit weak and numb and this is been an ongoing issue but mildly worse.  The history is provided by the patient.  Leg Pain Location:  Leg Time since incident:  12 months Injury: no   Leg location:  L leg and R leg Pain details:    Quality:  Aching   Radiates to:  Does not radiate   Severity:  Moderate   Onset quality:  Gradual   Duration:  12 months   Timing:  Constant   Progression:  Worsening Chronicity:  New Dislocation: no   Prior injury to area:  No Relieved by:  Nothing Worsened by:  Nothing Ineffective treatments:  None tried Associated symptoms: back pain   Associated symptoms: no fever       Past Medical History:  Diagnosis Date   Anemia    Arthritis 08/17/2020   Chronic back pain    Diabetes mellitus without complication ([facility])    Ovarian cyst    Pneumonia    Polyneuropathy     Patient Active Problem List   Diagnosis Date Noted   HNP (herniated nucleus pulposus), lumbar 11/14/2020   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   Chronic pain syndrome 06/14/2020   Pharmacologic therapy 06/14/2020   Disorder of skeletal system  06/14/2020   Problems influencing health status 06/14/2020   History of marijuana use 54/62/7035   History of illicit drug use 00/93/8182   Abnormal drug screen (05/25/2020) 06/14/2020   Marijuana use 06/14/2020   Abnormal MRI, lumbar spine (05/16/2020) 06/14/2020   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, L5-S1) 06/14/2020   Lumbar lateral recess stenosis (Left: L4-5, L5-S1) 06/14/2020   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) 06/14/2020   S/P lumbar microdiscectomy 06/07/2020   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   GI bleed 12/01/2019   Muscle spasm 11/06/2019   Sciatica 11/06/2019   Low back pain 02/04/2019   Type 2 diabetes mellitus with diabetic polyneuropathy, without long-term current use of insulin ([facility]) 12/03/2018   Type 2 diabetes mellitus with hyperglycemia, without long-term current use of insulin ([facility]) 05/15/2018    Past Surgical History:  Procedure Laterality Date   CESAREAN SECTION     CHOLECYSTECTOMY  2004   LUMBAR LAMINECTOMY N/A 05/30/2020   Procedure: Lumbar five Laminectomy,  Bilateral Microdiscectomy, Left Lumbar five -Sacral one Microdiscectomy;  Surgeon: Marybelle Killings, MD;  Location: [facility];  Service: Orthopedics;  Laterality: N/A;   LUMBAR LAMINECTOMY Left 11/14/2020   Procedure: LEFT LUMBAR FOUR-FIVE MICRODISCECTOMY;  Surgeon: Marybelle Killings, MD;  Location: [facility];  Service: Orthopedics;  Laterality: Left;   TUBAL LIGATION  10/19/2010     OB History   No obstetric history on file.     Family History  Problem Relation Age of Onset   Diabetes Mother    Hypertension Mother    Stroke Mother     Social History   Tobacco Use   Smoking status: Every Day    Types: Cigars   Smokeless tobacco: Never   Tobacco comments:    black and milds per patient 1-2/day  Vaping Use   Vaping Use: Never used  Substance Use Topics   Alcohol use: Not Currently   Drug use: Not Currently    Types: Marijuana    Comment: per patient  stopped smoking marijuana Mar 2022    Home Medications Prior to Admission medications   Medication Sig Start Date End Date Taking? Authorizing Provider  Blood Glucose Monitoring Suppl (ACCU-CHEK GUIDE ME) w/Device KIT Use as instructed to check blood sugar daily 07/13/20   Shamleffer, Melanie Crazier, MD  empagliflozin (JARDIANCE) 10 MG TABS tablet Take 1 tablet (10 mg total) by mouth daily. 10/10/20   Shamleffer, Melanie Crazier, MD  ferrous gluconate (FERGON) 324 MG tablet Take 1 tablet (324 mg total) by mouth daily with breakfast. 01/30/19   Jacelyn Pi, Lilia Argue, MD  glipiZIDE (GLUCOTROL) 10 MG tablet Take 2 tablets (20 mg total) by mouth 2 (two) times daily before a meal. 07/07/20   Shamleffer, Melanie Crazier, MD  glucose blood (ACCU-CHEK GUIDE) test strip Use as instructed to check blood sugar 2 times daily 07/13/20   Shamleffer, Melanie Crazier, MD  methocarbamol (ROBAXIN) 500 MG tablet Take 1 tablet (500 mg total) by mouth every 6 (six) hours as needed for muscle spasms. 11/14/20   Lanae Crumbly, PA-C  oxyCODONE-acetaminophen (PERCOCET) 7.5-325 MG tablet Take 1 tablet by mouth every 4 (four) hours as needed for severe pain. 11/14/20   Lanae Crumbly, PA-C  pregabalin (LYRICA) 150 MG capsule Take 150 mg by mouth 2 (two) times daily. 10/28/20   [provider]  TRULICITY 8.10 FB/5.1WC SOPN Inject 0.75 mg into the skin once a week. 10/13/20   [provider]    Allergies    Patient has no known allergies.  Review of Systems   Review of Systems  Constitutional:  Negative for chills and fever.  HENT:  Negative for congestion and rhinorrhea.   Eyes:  Negative for redness and visual disturbance.  Respiratory:  Negative for shortness of breath and wheezing.   Cardiovascular:  Negative for chest pain and palpitations.  Gastrointestinal:  Negative for nausea and vomiting.  Genitourinary:  Negative for dysuria and urgency.  Musculoskeletal:  Positive for arthralgias, back pain  and myalgias.  Skin:  Negative for pallor and wound.  Neurological:  Negative for dizziness and headaches.   Physical Exam Updated Vital Signs BP 107/78   Pulse 97   Temp 98.2 F (36.8 C) (Oral)   Resp 17   LMP 11/20/2020   SpO2 100%   Physical Exam Vitals and nursing note reviewed.  Constitutional:      General: She is not in acute distress.    Appearance: She is well-developed. She is not diaphoretic.  HENT:     Head: Normocephalic and atraumatic.  Eyes:     Pupils: Pupils are equal, round, and reactive to light.  Cardiovascular:     Rate and Rhythm: Normal rate and regular rhythm.     Heart sounds: No murmur heard.  No friction rub. No gallop.  Pulmonary:     Effort: Pulmonary effort is normal.     Breath sounds: No wheezing or rales.  Abdominal:     General: There is no distension.     Palpations: Abdomen is soft.     Tenderness: There is no abdominal tenderness.  Musculoskeletal:        General: No tenderness.     Cervical back: Normal range of motion and neck supple.     Comments: Reflexes are 2+ and equal to bilateral lower extremities.  There is no clonus.  She forcefully restricts my motion to lift her legs up bilaterally.  She is ambulating without issue.  Wound is clean dry and intact.  There is no erythema or drainage.  Skin:    General: Skin is warm and dry.  Neurological:     Mental Status: She is alert and oriented to person, place, and time.  Psychiatric:        Behavior: Behavior normal.    ED Results / Procedures / Treatments   Labs (all labs ordered are listed, but only abnormal results are displayed) Labs Reviewed - No data to display  EKG None  Radiology No results found.  Procedures Procedures   Medications Ordered in ED Medications  acetaminophen (TYLENOL) tablet 1,000 mg (1,000 mg Oral Given 11/29/20 0916)  oxyCODONE (Oxy IR/ROXICODONE) immediate release tablet 5 mg (5 mg Oral Given 11/29/20 0916)  diazepam (VALIUM) tablet 5 mg (5  mg Oral Given 11/29/20 4917)    ED Course  I have reviewed the triage vital signs and the nursing notes.  Pertinent labs & imaging results that were available during my care of the patient were reviewed by me and considered in my medical decision making (see chart for details).    MDM Rules/Calculators/A&P                           40 yo F with a chief complaints of low back pain that radiates down both of her legs.  This is been an ongoing issue for her.  Patient denies new injury other than recently having spinal surgery.  She has been seen in the clinic multiple times since and even discussed her discomfort with him yesterday.  We will treat pain here.  Will discuss with Ortho.  I discussed the case with Dr. Durward Fortes, orthopedics he recommended having the patient call the office to try and schedule a urgent revisit.  10:38 AM:  I have discussed the diagnosis/risks/treatment options with the patient and believe the pt to be eligible for discharge home to follow-up with Ortho. We also discussed returning to the ED immediately if new or worsening sx occur. We discussed the sx which are most concerning (e.g., sudden worsening pain, fever, inability to tolerate by mouth) that necessitate immediate return. Medications administered to the patient during their visit and any new prescriptions provided to the patient are listed below.  Medications given during this visit Medications  acetaminophen (TYLENOL) tablet 1,000 mg (1,000 mg Oral Given 11/29/20 0916)  oxyCODONE (Oxy IR/ROXICODONE) immediate release tablet 5 mg (5 mg Oral Given 11/29/20 0916)  diazepam (VALIUM) tablet 5 mg (5 mg Oral Given 11/29/20 9150)     The patient appears reasonably screen and/or stabilized for discharge and I doubt any other medical condition or other [facility] requiring further screening, evaluation, or treatment in the ED at this time prior to discharge.   Final Clinical  Impression(s) / ED Diagnoses Final diagnoses:   Bilateral leg pain    Rx / DC Orders ED Discharge Orders     None        Deno Etienne, DO 11/29/20 1038

## 2020-12-01 ENCOUNTER — Other Ambulatory Visit: Payer: Self-pay

## 2020-12-01 ENCOUNTER — Ambulatory Visit: Payer: Self-pay

## 2020-12-01 ENCOUNTER — Encounter: Payer: Self-pay | Admitting: Surgery

## 2020-12-01 ENCOUNTER — Ambulatory Visit (INDEPENDENT_AMBULATORY_CARE_PROVIDER_SITE_OTHER): Payer: Medicaid Other | Admitting: Surgery

## 2020-12-01 VITALS — BP 91/63 | HR 108 | Ht 69.0 in | Wt 195.0 lb

## 2020-12-01 DIAGNOSIS — Z9889 Other specified postprocedural states: Secondary | ICD-10-CM

## 2020-12-01 DIAGNOSIS — M5416 Radiculopathy, lumbar region: Secondary | ICD-10-CM

## 2020-12-01 NOTE — Progress Notes (Signed)
Office Visit Note   Patient: Melinda Stone           Date of Birth: [birth year]           MRN: [id number] Visit Date: 12/01/2020              Requested by: Anselm Jungling, NP [address] [address] [place],  [place] [address] PCP: Anselm Jungling, NP   Assessment & Plan: Visit Diagnoses:  1. Radiculopathy, lumbar region   2. S/P lumbar microdiscectomy     Plan: Since patient is complaining of increased pain since surgery November 14, 2020 and with recent emergency room visit I will get stat lumbar MRI with and without contrast.  We will see if this can be done today.  Follow-up with Dr. Ophelia Charter in 1 week to review the study and thus I need to have her see our other spine surgeon who is in the clinic sooner.  Dr. Ophelia Charter is out of the office for the rest of the week.  With patient's somewhat sedated appearance today no narcotics given at this visit.  We will wait to see what her MRI scan looks like.  Follow-Up Instructions: Return in about 1 week (around 12/08/2020) for WITH DR YATES TO REVIEW LUMBAR MRI .   Orders:  Orders Placed This Encounter  Procedures   MR Lumbar Spine W Wo Contrast   XR Lumbar Spine Complete   No orders of the defined types were placed in this encounter.     Procedures: No procedures performed   Clinical Data: No additional findings.   Subjective: Chief Complaint  Patient presents with   Right Leg - Pain   Left Leg - Pain    HPI 40 year old black female who is status post L4-5 microdiscectomy November 14, 2020 again returns.  Patient came in for staple removal November 28, 2020.  There were no issues.  She has continued to complain of increased pain in her back that radiates into her buttocks and thighs.  Does not feel she had any improvement from her surgery.  No complaints of bowel or bladder incontinence.  patient went to the emergency department for evaluation November 29, 2020.  No imaging studies were obtained.   Objective: Vital Signs: BP 91/63    Pulse (!) 108   Ht 5\' 9"  (1.753 m)   Wt 195 lb (88.5 kg)   LMP 11/20/2020   BMI 28.80 kg/m   Physical Exam Somewhat disheveled appearing female and also appears somewhat sedated.  Surgical incision is well-healed.  No drainage or signs of infection.  Negative log about his.  Pain with bilateral straight leg raise.  Is difficult to get a good assessment of lower extremity strength question this is due to pain or poor effort.  Bilateral calves are tender. Ortho Exam  Specialty Comments:  No specialty comments available.  Imaging: No results found.   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, L5-S1) 06/14/2020   Lumbar lateral recess stenosis (Left:  L4-5, L5-S1) 06/14/2020   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) 06/14/2020   S/P lumbar microdiscectomy 06/07/2020   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   GI bleed 12/01/2019   Muscle spasm 11/06/2019   Sciatica 11/06/2019   Low back pain 02/04/2019   Type 2 diabetes mellitus with diabetic polyneuropathy, without long-term current use of insulin (HCC) 12/03/2018   Type 2 diabetes mellitus with hyperglycemia, without long-term current use of insulin (HCC) 05/15/2018   Past Medical History:  Diagnosis Date   Anemia    Arthritis 08/17/2020   Chronic back pain    Diabetes mellitus without complication (HCC)    Ovarian cyst    Pneumonia    Polyneuropathy     Family  History  Problem Relation Age of Onset   Diabetes Mother    Hypertension Mother    Stroke Mother     Past Surgical History:  Procedure Laterality Date   CESAREAN SECTION     CHOLECYSTECTOMY  2004   LUMBAR LAMINECTOMY N/A 05/30/2020   Procedure: Lumbar five Laminectomy,  Bilateral Microdiscectomy, Left Lumbar five -Sacral one Microdiscectomy;  Surgeon: [name], MD;  Location: [facility];  Service: Orthopedics;  Laterality: N/A;   LUMBAR LAMINECTOMY Left 11/14/2020   Procedure: LEFT LUMBAR FOUR-FIVE MICRODISCECTOMY;  Surgeon: [name], MD;  Location: [facility];  Service: Orthopedics;  Laterality: Left;   TUBAL LIGATION  10/19/2010   Social History   Occupational History    Comment: home maker  Tobacco Use   Smoking status: Every Day    Types: Cigars   Smokeless tobacco: Never   Tobacco comments:    black and milds per patient 1-2/day  Vaping Use   Vaping Use: Never used  Substance and Sexual Activity   Alcohol use: Not Currently   Drug use: Not Currently    Types: Marijuana    Comment: per patient stopped smoking marijuana Mar 2022   Sexual activity: Yes    Birth control/protection: None

## 2020-12-02 ENCOUNTER — Ambulatory Visit
Admission: RE | Admit: 2020-12-02 | Discharge: 2020-12-02 | Disposition: A | Discharge status: Home / Self Care | Payer: Medicaid Other | Source: Ambulatory Visit | Attending: Surgery | Admitting: Surgery

## 2020-12-02 DIAGNOSIS — Z9889 Other specified postprocedural states: Secondary | ICD-10-CM

## 2020-12-02 DIAGNOSIS — M5416 Radiculopathy, lumbar region: Secondary | ICD-10-CM

## 2020-12-02 MED ORDER — GADOBENATE DIMEGLUMINE 529 MG/ML IV SOLN
17.0000 mL | Freq: Once | INTRAVENOUS | Status: AC | PRN
  Administered 2020-12-02: 17 mL via INTRAVENOUS

## 2020-12-06 ENCOUNTER — Ambulatory Visit (INDEPENDENT_AMBULATORY_CARE_PROVIDER_SITE_OTHER): Payer: Medicaid Other | Admitting: Orthopaedic Surgery

## 2020-12-06 ENCOUNTER — Other Ambulatory Visit: Payer: Self-pay

## 2020-12-06 ENCOUNTER — Encounter: Payer: Self-pay | Admitting: Orthopaedic Surgery

## 2020-12-06 VITALS — BP 100/68 | HR 121 | Ht 69.0 in | Wt 195.0 lb

## 2020-12-06 DIAGNOSIS — M5126 Other intervertebral disc displacement, lumbar region: Secondary | ICD-10-CM

## 2020-12-06 NOTE — Progress Notes (Signed)
Post-Op Visit Note   Patient: Melinda Stone           Date of Birth: 09/06/1980           MRN: [id number] Visit Date: 12/06/2020 PCP: Anselm Jungling, NP   Assessment & Plan: 3 weeks post microdiscectomy recurrent disc L4-5 with massive fragment.  Patient states she is having considerable amount of pain both legs great difficulty walking.  She does not have a walker. New imaging study 12/02/2020 shows as follows.IMPRESSION: 1. At L4-L5, postoperative changes with left microdiscectomy and enhancing granulation in the left lateral canal and left subarticular recess. Canal stenosis and left subarticular recess stenosis is improved. 2. At L5-S1, similar moderate left paracentral disc protrusion with moderate canal stenosis and left subarticular recess effacement. 3. Ill-defined postoperative fluid collection in the left paraspinal soft tissues at L4-L5, measuring 2.7 x 1.4 cm.     Electronically Signed   By: Feliberto Harts M.D.   On: 12/02/2020 12:56   Chief Complaint:  Chief Complaint  Patient presents with   Lower Back - Follow-up    MRI lumbar review   Visit Diagnoses: No diagnosis found.  Plan: She is got some pain medication from Dr. Cherly Hensen.  The study shows significant improvement from preop imaging which we reviewed with her.  She has been taken off of Lyrica as she states on medications making her drowsy and forgetful.  Incision looks good.  She did have some postop fluid collection and subcutaneous tissue as expected.  Patient has no fever or chills.  Continue walking.  Recheck 4 weeks.  Prescription for folding walker given and she can use to help with ambulation.  Follow-Up Instructions: No follow-ups on file.   Orders:  No orders of the defined types were placed in this encounter.  No orders of the defined types were placed in this encounter.   Imaging: No results found.  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, L5-S1) 06/14/2020   Lumbar lateral recess stenosis (Left: L4-5, L5-S1) 06/14/2020   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) 06/14/2020   S/P lumbar microdiscectomy 06/07/2020   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   GI bleed 12/01/2019   Muscle spasm 11/06/2019   Sciatica 11/06/2019   Low back pain 02/04/2019   Type 2 diabetes mellitus with diabetic polyneuropathy, without long-term current use of insulin (HCC) 12/03/2018   Type 2 diabetes mellitus with hyperglycemia, without long-term current use of insulin (HCC) 05/15/2018   Past Medical History:  Diagnosis Date   Anemia    Arthritis 08/17/2020   Chronic back pain    Diabetes mellitus without complication (HCC)    Ovarian cyst    Pneumonia    Polyneuropathy     Family History  Problem Relation Age of Onset   Diabetes Mother    Hypertension Mother    Stroke Mother     Past Surgical History:  Procedure Laterality Date   CESAREAN SECTION  CHOLECYSTECTOMY  2004   LUMBAR LAMINECTOMY N/A 05/30/2020   Procedure: Lumbar five Laminectomy,  Bilateral Microdiscectomy, Left Lumbar five -Sacral one Microdiscectomy;  Surgeon: [name], MD;  Location: [facility] OR;  Service: Orthopedics;  Laterality: N/A;   LUMBAR LAMINECTOMY Left 11/14/2020   Procedure: LEFT LUMBAR FOUR-FIVE MICRODISCECTOMY;  Surgeon: [name], MD;  Location: [facility] OR;  Service: Orthopedics;  Laterality: Left;   TUBAL LIGATION  10/19/2010    Social History   Occupational History    Comment: home maker  Tobacco Use   Smoking status: Every Day    Types: Cigars   Smokeless tobacco: Never   Tobacco comments:    black and milds per patient 1-2/day  Vaping Use   Vaping Use: Never used  Substance and Sexual Activity   Alcohol use: Not Currently   Drug use: Not Currently    Types: Marijuana    Comment: per patient stopped smoking marijuana Mar 2022   Sexual activity: Yes    Birth control/protection: None

## 2020-12-22 ENCOUNTER — Telehealth: Payer: Self-pay | Admitting: Orthopaedic Surgery

## 2020-12-22 NOTE — Telephone Encounter (Signed)
Pt called requesting a call back. Pt states she is having severe leg cramping and the muscle relaxer is not working. Please call pt at [phone].

## 2020-12-22 NOTE — Telephone Encounter (Signed)
Can you please advise?

## 2020-12-23 MED ORDER — GABAPENTIN 300 MG PO CAPS: 300.0000 mg | ORAL_CAPSULE | Freq: Two times a day (BID) | ORAL | 1 refills | Status: DC

## 2020-12-23 NOTE — Telephone Encounter (Signed)
Sent to pharmacy 

## 2021-01-03 ENCOUNTER — Telehealth: Payer: Self-pay | Admitting: Diagnostic Neuroimaging

## 2021-01-03 ENCOUNTER — Encounter: Payer: Self-pay | Admitting: Orthopaedic Surgery

## 2021-01-03 ENCOUNTER — Ambulatory Visit (INDEPENDENT_AMBULATORY_CARE_PROVIDER_SITE_OTHER): Payer: Medicaid Other | Admitting: Orthopaedic Surgery

## 2021-01-03 ENCOUNTER — Other Ambulatory Visit: Payer: Self-pay

## 2021-01-03 VITALS — BP 119/80 | HR 108 | Ht 69.0 in | Wt 195.0 lb

## 2021-01-03 DIAGNOSIS — Z9889 Other specified postprocedural states: Secondary | ICD-10-CM

## 2021-01-03 NOTE — Telephone Encounter (Signed)
Pt called, having leg spasms, numbness in feet, sharp leg pain. PCP recommended to see the neurologist. Would like a call from the nurse

## 2021-01-03 NOTE — Telephone Encounter (Signed)
Called patient and advised her of Dr Richrd Humbles recommendation to FU with pain management. She stated  she wants to know what's wrong instead on being on pain meds and  muscle relaxers. She saw pain management yesterday, is now on amitriptyline, gabapentin, hydrocodone and methocarbamol. She stated she wants someone to look at her legs due to all this pain to see what's causing it.  I advised will discuss with Dr Marjory Lies and call her back. Patient verbalized understanding, appreciation.

## 2021-01-03 NOTE — Progress Notes (Signed)
Post-Op Visit Note   Patient: Melinda Stone           Date of Birth: 1980-08-16           MRN: [id number] Visit Date: 01/03/2021 PCP: Anselm Jungling, NP   Assessment & Plan: Post microdiscectomy L4-5x2 with left L5-S1 microdiscectomy on first procedure 05/30/2020.  She is still having pain in her back and legs she has been referred by her PCP to the pain clinic and is on hydrocodone.  She had an MRI after her second surgery which showed improvement of canal stenosis and left subarticular recess stenosis.  She had some scar tissue a little bit of postop fluid collection as expected few weeks after surgery.  She has been walking daily taking gabapentin.  Anterior tib EHL is strong both sides. Patient used to work in [employer].  Has not been back to work since her original procedure. Chief Complaint:  Chief Complaint  Patient presents with   Lower Back - Routine Post Op   Visit Diagnoses: No diagnosis found.  Plan: We will recheck her again in 4 months.  Plan of the repeat imaging studies in 6 months if she is still having problems.   Follow-Up Instructions: Return in about 4 months (around 05/06/2021).   Orders:  No orders of the defined types were placed in this encounter.  No orders of the defined types were placed in this encounter.   Imaging: No results found.  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, L5-S1)  06/14/2020   Lumbar lateral recess stenosis (Left: L4-5, L5-S1) 06/14/2020   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) 06/14/2020   S/P lumbar microdiscectomy 06/07/2020   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   GI bleed 12/01/2019   Muscle spasm 11/06/2019   Sciatica 11/06/2019   Low back pain 02/04/2019   Type 2 diabetes mellitus with diabetic polyneuropathy, without long-term current use of insulin (HCC) 12/03/2018   Type 2 diabetes mellitus with hyperglycemia, without long-term current use of insulin (HCC) 05/15/2018   Past Medical History:  Diagnosis Date   Anemia    Arthritis 08/17/2020   Chronic back pain    Diabetes mellitus without complication (HCC)    Ovarian cyst    Pneumonia    Polyneuropathy     Family History  Problem Relation Age of Onset   Diabetes Mother    Hypertension Mother    Stroke Mother     Past Surgical History:  Procedure Laterality Date   CESAREAN SECTION     CHOLECYSTECTOMY  2004   LUMBAR LAMINECTOMY N/A 05/30/2020   Procedure: Lumbar five Laminectomy,  Bilateral Microdiscectomy, Left Lumbar five -Sacral one Microdiscectomy;  Surgeon: [name], MD;  Location: [facility] OR;  Service: Orthopedics;  Laterality: N/A;   LUMBAR LAMINECTOMY Left 11/14/2020   Procedure: LEFT LUMBAR FOUR-FIVE MICRODISCECTOMY;  Surgeon: [name], MD;  Location: [facility] OR;  Service: Orthopedics;  Laterality: Left;   TUBAL LIGATION  10/19/2010  Social History   Occupational History    Comment: home maker  Tobacco Use   Smoking status: Every Day    Types: Cigars   Smokeless tobacco: Never   Tobacco comments:    black and milds per patient 1-2/day  Vaping Use   Vaping Use: Never used  Substance and Sexual Activity   Alcohol use: Not Currently   Drug use: Not Currently    Types: Marijuana    Comment: per patient stopped smoking marijuana Mar 2022   Sexual activity: Yes    Birth control/protection: None

## 2021-01-04 NOTE — Telephone Encounter (Signed)
Spoke with patient and informed her Dr Marjory Lies stated her pain is due to lumbar radiculopathies + diabetic neuropathy. This is the cause. He recommends to treat pain symptoms via pain management. Patient verbalized understanding, appreciation.

## 2021-01-10 ENCOUNTER — Emergency Department: Payer: Self-pay

## 2021-01-10 ENCOUNTER — Encounter: Payer: Self-pay | Admitting: Orthopaedic Surgery

## 2021-01-10 ENCOUNTER — Ambulatory Visit (INDEPENDENT_AMBULATORY_CARE_PROVIDER_SITE_OTHER): Payer: Medicaid Other | Admitting: Orthopaedic Surgery

## 2021-01-10 ENCOUNTER — Emergency Department (HOSPITAL_COMMUNITY)
Admission: EM | Admit: 2021-01-10 | Discharge: 2021-01-10 | Discharge status: Absent without leave | Payer: Medicaid Other

## 2021-01-10 VITALS — BP 110/65 | HR 76

## 2021-01-10 DIAGNOSIS — Z9889 Other specified postprocedural states: Secondary | ICD-10-CM

## 2021-01-10 NOTE — ED Notes (Signed)
Pt informed me that she was able to schedule an appointment today at her PCP regarding her symptoms. Pt stated she would like to check out and departed ED.

## 2021-01-10 NOTE — Progress Notes (Signed)
Post-Op Visit Note   Patient: Melinda Stone           Date of Birth: January 21, 1981           MRN: [id number] Visit Date: 01/10/2021 PCP: Anselm Jungling, NP   Assessment & Plan: Patient returns she fell yesterday she was cooking on a chair with rollers wheels in the kitchen was turning slipped out of chair and landed on her buttocks with pain on her left side.  No ecchymosis is present she has had soreness tenderness with pressure.  She was concerned she might have damaged something.  She is scheduled to start physical therapy on 01/19/2021.  Patient had two-level significant disc degeneration with disc herniations and had recurrent disc surgery 11/14/2020.  Chief Complaint:  Chief Complaint  Patient presents with   Lower Back - Post-op Follow-up   Visit Diagnoses:  1. S/P lumbar microdiscectomy     Plan: Patient will proceed with therapy starting 01/19/2021.  Continue gabapentin.  She will try to use the hydrocodone sparingly as best she can and I will recheck her in 6 weeks.  Follow-Up Instructions: No follow-ups on file.   Orders:  Orders Placed This Encounter  Procedures   XR Lumbar Spine 2-3 Views   No orders of the defined types were placed in this encounter.   Imaging: No results found.  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, L5-S1) 06/14/2020   Lumbar lateral recess stenosis (Left: L4-5, L5-S1) 06/14/2020    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) 06/14/2020   S/P lumbar microdiscectomy 06/07/2020   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   GI bleed 12/01/2019   Muscle spasm 11/06/2019   Sciatica 11/06/2019   Low back pain 02/04/2019   Type 2 diabetes mellitus with diabetic polyneuropathy, without long-term current use of insulin (HCC) 12/03/2018   Type 2 diabetes mellitus with hyperglycemia, without long-term current use of insulin (HCC) 05/15/2018   Past Medical History:  Diagnosis Date   Anemia    Arthritis 08/17/2020   Chronic back pain    Diabetes mellitus without complication (HCC)    Ovarian cyst    Pneumonia    Polyneuropathy     Family History  Problem Relation Age of Onset   Diabetes Mother    Hypertension Mother    Stroke Mother     Past Surgical History:  Procedure Laterality Date   CESAREAN SECTION     CHOLECYSTECTOMY  2004   LUMBAR LAMINECTOMY N/A 05/30/2020   Procedure: Lumbar five Laminectomy,  Bilateral Microdiscectomy, Left Lumbar five -Sacral one Microdiscectomy;  Surgeon: [name], MD;  Location: [facility] OR;  Service: Orthopedics;  Laterality: N/A;   LUMBAR LAMINECTOMY Left 11/14/2020   Procedure: LEFT LUMBAR FOUR-FIVE MICRODISCECTOMY;  Surgeon: [name], MD;  Location: [facility] OR;  Service: Orthopedics;  Laterality: Left;   TUBAL LIGATION  10/19/2010   Social History   Occupational History    Comment: home maker  Tobacco Use  Smoking status: Every Day    Types: Cigars   Smokeless tobacco: Never   Tobacco comments:    black and milds per patient 1-2/day  Vaping Use   Vaping Use: Never used  Substance and Sexual Activity   Alcohol use: Not Currently   Drug use: Not Currently    Types: Marijuana    Comment: per patient stopped smoking marijuana Mar 2022   Sexual activity: Yes    Birth control/protection: None

## 2021-01-12 ENCOUNTER — Other Ambulatory Visit: Payer: Self-pay | Admitting: Orthopaedic Surgery

## 2021-01-12 ENCOUNTER — Telehealth: Payer: Self-pay

## 2021-01-12 MED ORDER — TRAMADOL HCL 50 MG PO TABS: 50.0000 mg | ORAL_TABLET | Freq: Two times a day (BID) | ORAL | 0 refills | Status: DC | PRN

## 2021-01-12 NOTE — Telephone Encounter (Signed)
Pt called in stating that she is hurting so bad she is shaking. She doesn't want to go to the hospital if she don't have to. She would like something to be called in to help ease some of the pain she is having.

## 2021-01-12 NOTE — Progress Notes (Signed)
Tramadol # 30  1po q 12 hts prn pain

## 2021-01-12 NOTE — Telephone Encounter (Signed)
Pharm called stating that the patient picked up a RX for hydorcodone on 10/19 a 30 day supply. They wanted to make you aware before filling this rx for tramadol  Call back # [phone]

## 2021-01-12 NOTE — Telephone Encounter (Signed)
Called pharmacy and cancelled the tramadol. I called and talked to the pt. She stated she is having muscle spasms and really wanted a refill on her robaxin. Please advise

## 2021-01-13 NOTE — Telephone Encounter (Signed)
Pt called and advised and stated understanding  

## 2021-01-19 ENCOUNTER — Other Ambulatory Visit: Payer: Self-pay

## 2021-01-19 ENCOUNTER — Ambulatory Visit: Payer: Medicaid Other | Attending: Orthopaedic Surgery

## 2021-01-19 DIAGNOSIS — M5441 Lumbago with sciatica, right side: Secondary | ICD-10-CM | POA: Diagnosis present

## 2021-01-19 DIAGNOSIS — M5386 Other specified dorsopathies, lumbar region: Secondary | ICD-10-CM | POA: Diagnosis present

## 2021-01-19 DIAGNOSIS — M79662 Pain in left lower leg: Secondary | ICD-10-CM | POA: Diagnosis present

## 2021-01-19 DIAGNOSIS — G8929 Other chronic pain: Secondary | ICD-10-CM | POA: Insufficient documentation

## 2021-01-19 DIAGNOSIS — M79661 Pain in right lower leg: Secondary | ICD-10-CM | POA: Diagnosis present

## 2021-01-19 DIAGNOSIS — M5442 Lumbago with sciatica, left side: Secondary | ICD-10-CM | POA: Diagnosis present

## 2021-01-19 DIAGNOSIS — M6281 Muscle weakness (generalized): Secondary | ICD-10-CM | POA: Insufficient documentation

## 2021-01-19 DIAGNOSIS — R262 Difficulty in walking, not elsewhere classified: Secondary | ICD-10-CM | POA: Diagnosis present

## 2021-01-19 DIAGNOSIS — R2689 Other abnormalities of gait and mobility: Secondary | ICD-10-CM | POA: Insufficient documentation

## 2021-01-19 DIAGNOSIS — Z9889 Other specified postprocedural states: Secondary | ICD-10-CM | POA: Diagnosis present

## 2021-01-20 NOTE — Therapy (Signed)
[facility] Outpatient Rehabilitation [facility] [address] [place], [place], [address] Phone: [phone]   Fax:  [phone]  Physical Therapy Evaluation  Patient Details  Name: Melinda Stone MRN: [id number] Date of Birth: 01-Apr-1980 Referring Provider (PT): [name], MD   Encounter Date: 01/19/2021   PT End of Session - 01/20/21 2122     Visit Number 1    Number of Visits 17    Date for PT Re-Evaluation 03/18/21    Authorization Type MEDICAID [place] ACCESS-    Authorization - Visit Number 12    Authorization - Number of Visits 27    Progress Note Due on Visit 10    PT Start Time 1105    PT Stop Time 1150    PT Time Calculation (min) 45 min    Activity Tolerance Patient tolerated treatment well    Behavior During Therapy [facility] for tasks assessed/performed             Past Medical History:  Diagnosis Date   Anemia    Arthritis 08/17/2020   Chronic back pain    Diabetes mellitus without complication (HCC)    Ovarian cyst    Pneumonia    Polyneuropathy     Past Surgical History:  Procedure Laterality Date   CESAREAN SECTION     CHOLECYSTECTOMY  2004   LUMBAR LAMINECTOMY N/A 05/30/2020   Procedure: Lumbar five Laminectomy,  Bilateral Microdiscectomy, Left Lumbar five -Sacral one Microdiscectomy;  Surgeon: [name], MD;  [facility]: [facility] OR;  Service: Orthopedics;  Laterality: N/A;   LUMBAR LAMINECTOMY Left 11/14/2020   Procedure: LEFT LUMBAR FOUR-FIVE MICRODISCECTOMY;  Surgeon: [name], MD;  Location: [facility] OR;  Service: Orthopedics;  Laterality: Left;   TUBAL LIGATION  10/19/2010    There were no vitals filed for this visit.    Subjective Assessment - 01/20/21 2311     Subjective Pt reports a Hx of low back and bilat leg pain. Pt had microdisc surgery on 11/14/20, after which she reports her low back has been feeling better, but the leg pain and tightness has continued. On 01/09/21, pt reports falling off a rolling chair in  the kitchen and landing on her buttock resulting in pain around tailbone. After the fall she notes her tail bone has been significantly painful, her low back pain has been low, and her bilat leg pain and tightness has continued. Pt notes the the pain of her LEs is throughout the full leg extending to the foot of the L LE and to the R knee of the R LE.    Pertinent History Arthritis, Chronic back pain, Diabetes mellitus without complications, Polyneuropathy, High BMI    Diagnostic tests 12/02/20: MRI low back- IMPRESSION:  1. At L4-L5, postoperative changes with left microdiscectomy and  enhancing granulation in the left lateral canal and left  subarticular recess. Canal stenosis and left subarticular recess  stenosis is improved.  2. At L5-S1, similar moderate left paracentral disc protrusion with  moderate canal stenosis and left subarticular recess effacement.  3. Ill-defined postoperative fluid collection in the left paraspinal  soft tissues at L4-L5, measuring 2.7 x 1.4 cm.  01/10/21: Xray lumbar- not able to see the results    Patient Stated Goals To have less painand to move with greater ease    Currently in Pain? Yes    Pain Score 9     Pain Location Sacrum    Pain Orientation Posterior    Pain Descriptors /  Indicators Burning;Tightness;Sharp;Throbbing    Pain Type Acute pain    Pain Onset 1 to 4 weeks ago    Pain Frequency Constant    Aggravating Factors  Moving    Pain Relieving Factors Medications    Pain Score 9    Pain Location Leg    Pain Orientation Right;Left    Pain Descriptors / Indicators Burning;Throbbing;Tightness    Pain Onset More than a month ago    Pain Frequency Constant    Aggravating Factors  Standing and walking    Pain Relieving Factors Medications                [facility] PT Assessment - 01/20/21 0001       Assessment   Medical Diagnosis S/P lumbar microdiscectomy    Referring Provider (PT) [name], MD    Onset Date/Surgical Date 11/14/20   fell on  rear end 01/09/21   Hand Dominance Right    Next MD Visit 02/21/21    Prior Therapy yes      Precautions   Precautions None      Restrictions   Weight Bearing Restrictions No      Balance Screen   Has the patient fallen in the past 6 months Yes    How many times? 1    Has the patient had a decrease in activity level because of a fear of falling?  Yes    Is the patient reluctant to leave their home because of a fear of falling?  No      Home Environment   Living Environment Private residence    Living Arrangements Children    Type of Home Apartment    Home Access Level entry    Home Layout One level      Prior Function   Level of Independence Independent    Vocation --   in the process for applying for disability   Leisure Caring for children, watches TV and phone      Cognition   Overall Cognitive Status Within Functional Limits for tasks assessed      Observation/Other Assessments   Focus on Therapeutic Outcomes (FOTO)  NA      ROM / Strength   AROM / PROM / Strength Strength;AROM      AROM   AROM Assessment Site Lumbar    Lumbar Flexion Mod limitation, ^ bilat gluteal and sacral pain    Lumbar Extension Min limitation, ^ bilat gluteal and sacral pain    Lumbar - Right Side Bend Min limitation, ^ bilat gluteal and sacral pain    Lumbar - Left Side Bend Min limitation, ^ bilat gluteal and sacral pain    Lumbar - Right Rotation Full, ^ bilat gluteal and sacral pain    Lumbar - Left Rotation Full, ^ bilat gluteal and sacral pain      Strength   Overall Strength Comments Strength was limited by pain in some degree    Strength Assessment Site Hip;Knee    Right/Left Hip Right;Left    Right Hip Flexion 3/5    Right Hip Extension 3/5    Right Hip External Rotation  3/5    Right Hip Internal Rotation 3/5    Right Hip ABduction 3/5    Right Hip ADduction 3+/5    Left Hip Flexion 3-/5    Left Hip Extension 3-/5    Left Hip External Rotation 3-/5    Left Hip Internal  Rotation 3-/5    Left Hip ABduction  3-/5    Left Hip ADduction 3+/5    Right/Left Knee Right;Left    Right Knee Flexion 3+/5    Right Knee Extension 3+/5    Left Knee Flexion 3/5    Left Knee Extension 3/5      Flexibility   Soft Tissue Assessment /Muscle Length yes    Hamstrings L 39, R 43      Palpation   Palpation comment TTP throughout the sacrum and gluteal area      Transfers   Transfers Sit to Stand;Stand to Sit    Sit to Stand 6: Modified independent (Device/Increase time)   labored     Ambulation/Gait   Ambulation/Gait Yes    Ambulation/Gait Assistance 6: Modified independent (Device/Increase time)    Gait Pattern --   decreased pace and step length. increased knee valgus                       Objective measurements completed on examination: See above findings.                PT Education - 01/20/21 2126     Education Details Eval findings, POC, HEP for lumopelvic mobility, use of heat or cold packs to manage pain    Person(s) Educated Patient    Methods Explanation;Demonstration;Tactile cues;Verbal cues;Handout    Comprehension Verbalized understanding;Returned demonstration;Verbal cues required;Tactile cues required;Need further instruction              PT Short Term Goals - 01/20/21 2301       PT SHORT TERM GOAL #1   Title Pt will be Ind in an initial HEP    Baseline started on eval    Status New    Target Date 02/10/21      PT SHORT TERM GOAL #2   Title Pt will voice understanding of measures to assist in the reduction/management of pain    Status New    Target Date 02/10/21               PT Long Term Goals - 01/20/21 2304       PT LONG TERM GOAL #1   Title Increase bilat LE strength to 4/5 for improved funtional mobility    Status New    Target Date 03/18/21      PT LONG TERM GOAL #2   Title Increase bilat hamstring ROM to 50d for improved lumbopelvic flexibility    Status New    Target Date 03/18/21       PT LONG TERM GOAL #3   Title Increase pt's trunk ROM to no or minimal limitations as indication of decreased pain and to improved functional mobility    Status New    Target Date 03/18/21      PT LONG TERM GOAL #4   Title Pt will report a decrease in sacral/gluteal pain to 5/10 or less with daily activitie    Baseline 9/10    Status New    Target Date 03/18/21      PT LONG TERM GOAL #5   Status New    Target Date 03/18/21                    Plan - 01/20/21 2131     Clinical Impression Statement Pt presents to PT with a mixture of issues: microdiscetomy surgery on 8/29 which pt reports has improved her low back painn; chronic polyneuropathic pain of both LEs; and acute sacral pain following  a fall off a rolling chair on to her buttock. Pt eval revealed min to moderated decreased trunk ROM due to pain, tight hamstrings, bilat LE weakness which is limited to some degree by pain, TTP of the sacrum and bilat gluteal area, and an abnormal gait pattern. Pt was initiated with ther ex/HEP for lumbopelvic flexibility. Pt may benefit from skilled PT to address deficits, decrease pain, and improve pt's functional mobility.    Personal Factors and Comorbidities Past/Current Experience;Time since onset of injury/illness/exacerbation;Comorbidity 3+    Comorbidities Arthritis, Chronic back pain, Diabetes mellitus without complications, Polyneuropathy, High BMI    Stability/Clinical Decision Making Evolving/Moderate complexity    Clinical Decision Making Moderate    Rehab Potential Fair    PT Frequency 2x / week    PT Duration 8 weeks    PT Treatment/Interventions ADLs/Self Care Home Management;Aquatic Therapy;Cryotherapy;Electrical Stimulation;Iontophoresis 4mg /ml Dexamethasone;Moist Heat;Traction;Ultrasound;Neuromuscular re-education;Therapeutic exercise;Therapeutic activities;Functional mobility training;Patient/family education;Manual techniques;Dry needling;Taping;Joint Manipulations     PT Next Visit Plan Assess response to HEP. Assess 5xSTS and 2 min walking test and set goals.    PT Home Exercise Plan [id number]    Consulted and Agree with Plan of Care Patient             Patient will benefit from skilled therapeutic intervention in order to improve the following deficits and impairments:  Abnormal gait, Decreased range of motion, Difficulty walking, Obesity, Decreased activity tolerance, Pain, Decreased strength, Postural dysfunction  Visit Diagnosis: S/P lumbar microdiscectomy - Plan: PT plan of care cert/re-cert  Muscle weakness (generalized) - Plan: PT plan of care cert/re-cert  Decreased ROM of lumbar spine - Plan: PT plan of care cert/re-cert  Difficulty in walking, not elsewhere classified - Plan: PT plan of care cert/re-cert  Other abnormalities of gait and mobility - Plan: PT plan of care cert/re-cert     Problem List Patient Active Problem List   Diagnosis Date Noted   HNP (herniated nucleus pulposus), lumbar 11/14/2020   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   Chronic pain syndrome 06/14/2020   Pharmacologic therapy 06/14/2020   Disorder of skeletal system 06/14/2020   Problems influencing health status 06/14/2020   History of marijuana use 06/14/2020   History of illicit drug use 06/14/2020   Abnormal drug screen (05/25/2020) 06/14/2020   Marijuana use 06/14/2020   Abnormal MRI, lumbar spine (05/16/2020) 06/14/2020   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, L5-S1) 06/14/2020   Lumbar lateral recess stenosis (Left: L4-5, L5-S1) 06/14/2020   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) 06/14/2020   S/P lumbar microdiscectomy 06/07/2020   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   GI bleed 12/01/2019   Muscle spasm 11/06/2019   Sciatica 11/06/2019   Low back pain 02/04/2019   Type 2 diabetes mellitus with diabetic polyneuropathy, without long-term current use of insulin (HCC) 12/03/2018   Type 2 diabetes  mellitus with hyperglycemia, without long-term current use of insulin (HCC) 05/15/2018    Joellyn Rued MS, PT 01/20/21 11:25 PM   [facility] Outpatient Rehabilitation [facility] [address] [place], [place], [address] Phone: [phone]   Fax:  [phone]  Name: Melinda Stone MRN: [id number] Date of Birth: [birth year]

## 2021-01-31 ENCOUNTER — Ambulatory Visit: Payer: Medicaid Other

## 2021-01-31 ENCOUNTER — Other Ambulatory Visit: Payer: Self-pay

## 2021-01-31 DIAGNOSIS — M6281 Muscle weakness (generalized): Secondary | ICD-10-CM

## 2021-01-31 DIAGNOSIS — Z9889 Other specified postprocedural states: Secondary | ICD-10-CM

## 2021-01-31 DIAGNOSIS — M79662 Pain in left lower leg: Secondary | ICD-10-CM

## 2021-01-31 DIAGNOSIS — M79661 Pain in right lower leg: Secondary | ICD-10-CM

## 2021-01-31 DIAGNOSIS — R262 Difficulty in walking, not elsewhere classified: Secondary | ICD-10-CM

## 2021-01-31 DIAGNOSIS — M5442 Lumbago with sciatica, left side: Secondary | ICD-10-CM

## 2021-01-31 DIAGNOSIS — R2689 Other abnormalities of gait and mobility: Secondary | ICD-10-CM

## 2021-01-31 DIAGNOSIS — M5386 Other specified dorsopathies, lumbar region: Secondary | ICD-10-CM

## 2021-01-31 DIAGNOSIS — G8929 Other chronic pain: Secondary | ICD-10-CM

## 2021-01-31 NOTE — Therapy (Signed)
[facility] Outpatient Rehabilitation [facility] [address] [place], [place], [address] Phone: [phone]   Fax:  [phone]  Physical Therapy Treatment  Patient Details  Name: Melinda Stone MRN: [id number] Date of Birth: January 06, 1981 Referring Provider (PT): [name], MD   Encounter Date: 01/31/2021    Past Medical History:  Diagnosis Date   Anemia    Arthritis 08/17/2020   Chronic back pain    Diabetes mellitus without complication (HCC)    Ovarian cyst    Pneumonia    Polyneuropathy     Past Surgical History:  Procedure Laterality Date   CESAREAN SECTION     CHOLECYSTECTOMY  2004   LUMBAR LAMINECTOMY N/A 05/30/2020   Procedure: Lumbar five Laminectomy,  Bilateral Microdiscectomy, Left Lumbar five -Sacral one Microdiscectomy;  Surgeon: [name], MD;  [facility]: [facility] OR;  Service: Orthopedics;  Laterality: N/A;   LUMBAR LAMINECTOMY Left 11/14/2020   Procedure: LEFT LUMBAR FOUR-FIVE MICRODISCECTOMY;  Surgeon: [name], MD;  Location: [facility] OR;  Service: Orthopedics;  Laterality: Left;   TUBAL LIGATION  10/19/2010    There were no vitals filed for this visit.        [facility] PT Assessment - 02/01/21 0001       Transfers   Five time sit to stand comments  45.5      Ambulation/Gait   Gait Comments 2MWT= 258ft              [facility] Adult PT Treatment/Exercise:  Therapeutic Exercise:  - SKTC, 2x20", L and R - Piriformis stretch, 2x20" L and R - LTR, 5x10", L and R - Bridges, x5, 3 sec hold - PPT, x10, hands slide up thighs - hip abd, x10, GTB, 3" hold  Manual Therapy: NA  Neuromuscular re-ed: NA  Therapeutic Activity: - 5xSTS -                         PT Education - 02/01/21 1735     Education Details Updated HEP for lumbopelvic/LE mobility and strengthening exs    Person(s) Educated Patient    Methods Explanation;Demonstration;Tactile cues;Verbal cues;Handout    Comprehension  Verbalized understanding;Returned demonstration;Verbal cues required;Tactile cues required              PT Short Term Goals - 01/20/21 2301       PT SHORT TERM GOAL #1   Title Pt will be Ind in an initial HEP    Baseline started on eval    Status New    Target Date 02/10/21      PT SHORT TERM GOAL #2   Title Pt will voice understanding of measures to assist in the reduction/management of pain    Status New    Target Date 02/10/21               PT Long Term Goals - 02/01/21 1750       PT LONG TERM GOAL #5   Title Pt's 5xSTS time and distance will both improve as indication of decreased pain and improved function. 5xSTS to 30 sec or less and the to 326ft    Baseline 44.5 sec and 252 ft                     Patient will benefit from skilled therapeutic intervention in order to improve the following deficits and impairments:  Abnormal gait, Decreased range of motion, Difficulty walking, Obesity, Decreased activity  tolerance, Pain, Decreased strength, Postural dysfunction  Visit Diagnosis: Chronic midline low back pain with bilateral sciatica  Pain in left lower leg  Pain in right lower leg  S/P lumbar microdiscectomy  Muscle weakness (generalized)  Decreased ROM of lumbar spine  Difficulty in walking, not elsewhere classified  Other abnormalities of gait and mobility     Problem List Patient Active Problem List   Diagnosis Date Noted   HNP (herniated nucleus pulposus), lumbar 11/14/2020   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   Chronic pain syndrome 06/14/2020   Pharmacologic therapy 06/14/2020   Disorder of skeletal system 06/14/2020   Problems influencing health status 06/14/2020   History of marijuana use 06/14/2020   History of illicit drug use 06/14/2020   Abnormal drug screen (05/25/2020) 06/14/2020   Marijuana use 06/14/2020   Abnormal MRI, lumbar spine (05/16/2020) 06/14/2020   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, L5-S1) 06/14/2020   Lumbar lateral recess stenosis (Left: L4-5, L5-S1) 06/14/2020   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 [address] [place], [place], [address] Phone: [phone]   Fax:  [phone]  Name: Melinda Stone MRN: [id number] Date of Birth: 1980-08-23

## 2021-02-02 ENCOUNTER — Other Ambulatory Visit: Payer: Self-pay

## 2021-02-02 ENCOUNTER — Encounter: Payer: Self-pay | Admitting: Physical Therapy

## 2021-02-02 ENCOUNTER — Ambulatory Visit: Payer: Medicaid Other | Admitting: Physical Therapy

## 2021-02-02 DIAGNOSIS — Z9889 Other specified postprocedural states: Secondary | ICD-10-CM | POA: Diagnosis not present

## 2021-02-02 DIAGNOSIS — M79661 Pain in right lower leg: Secondary | ICD-10-CM

## 2021-02-02 DIAGNOSIS — G8929 Other chronic pain: Secondary | ICD-10-CM

## 2021-02-02 DIAGNOSIS — M79662 Pain in left lower leg: Secondary | ICD-10-CM

## 2021-02-02 NOTE — Therapy (Signed)
[facility] Outpatient Rehabilitation [facility] [address] [place], [place], [address] Phone: [phone]   Fax:  [phone]  Physical Therapy Treatment  Patient Details  Name: Melinda Stone MRN: [id number] Date of Birth: 05/01/1980 Referring Provider (PT): [name], MD   Encounter Date: 02/02/2021   PT End of Session - 02/02/21 1013     Visit Number 3    Number of Visits 17    Date for PT Re-Evaluation 03/18/21    Authorization Type MEDICAID [place] ACCESS-    Authorization Time Period 01/31/21-02/13/21    Authorization - Visit Number 2    Authorization - Number of Visits 3    PT Start Time 1000    PT Stop Time 1048    PT Time Calculation (min) 48 min             Past Medical History:  Diagnosis Date   Anemia    Arthritis 08/17/2020   Chronic back pain    Diabetes mellitus without complication (HCC)    Ovarian cyst    Pneumonia    Polyneuropathy     Past Surgical History:  Procedure Laterality Date   CESAREAN SECTION     CHOLECYSTECTOMY  2004   LUMBAR LAMINECTOMY N/A 05/30/2020   Procedure: Lumbar five Laminectomy,  Bilateral Microdiscectomy, Left Lumbar five -Sacral one Microdiscectomy;  Surgeon: [name], MD;  [facility] OR;  Service: Orthopedics;  Laterality: N/A;   LUMBAR LAMINECTOMY Left 11/14/2020   Procedure: LEFT LUMBAR FOUR-FIVE MICRODISCECTOMY;  Surgeon: [name], MD;  Location: [facility] OR;  Service: Orthopedics;  Laterality: Left;   TUBAL LIGATION  10/19/2010    There were no vitals filed for this visit.   Subjective Assessment - 02/02/21 1004     Subjective pain is 8/10, the cold weather makes pain worse. Muscles in legs are tight.    Diagnostic tests 12/02/20: MRI low back- IMPRESSION:  1. At L4-L5, postoperative changes with left microdiscectomy and  enhancing granulation in the left lateral canal and left  subarticular recess. Canal stenosis and left subarticular recess  stenosis is improved.  2. At  L5-S1, similar moderate left paracentral disc protrusion with  moderate canal stenosis and left subarticular recess effacement.  3. Ill-defined postoperative fluid collection in the left paraspinal  soft tissues at L4-L5, measuring 2.7 x 1.4 cm.  01/10/21: Xray lumbar- not able to see the results    Patient Stated Goals To have less painand to move with greater ease    Currently in Pain? Yes    Pain Score 0-No pain    Pain Location Back    Pain Score 8    Pain Location Leg    Pain Orientation Right;Left    Pain Descriptors / Indicators Burning;Tightness    Pain Type Chronic pain    Pain Radiating Towards back of calves and feet    Aggravating Factors  sitting,thighs get tired    Pain Relieving Factors get moving                [facility] PT Assessment - 02/02/21 0001       AROM   AROM Assessment Site Ankle    Right/Left Ankle Right;Left    Right Ankle Dorsiflexion -8    Left Ankle Dorsiflexion -8                           [facility] Adult PT Treatment/Exercise - 02/02/21 0001  Lumbar Exercises: Stretches   Active Hamstring Stretch 2 reps;30 seconds    Active Hamstring Stretch Limitations supine with strap    Single Knee to Chest Stretch 3 reps;20 seconds    Lower Trunk Rotation 10 seconds    Lower Trunk Rotation Limitations 10 reps    Piriformis Stretch 3 reps;20 seconds    Other Lumbar Stretch Exercise slant board stretch x 60 sec      Lumbar Exercises: Aerobic   Nustep L4 x 6 minutes UE/LE      Lumbar Exercises: Standing   Heel Raises 10 reps    Other Standing Lumbar Exercises alternating march  x 10 ; alternating hamstring curls x 10      Lumbar Exercises: Seated   Sit to Stand 10 reps    Sit to Stand Limitations from airex without UE      Lumbar Exercises: Supine   Pelvic Tilt 20 reps    Bridge 10 reps    Other Supine Lumbar Exercises adductor ball squeeze x 20    Other Supine Lumbar Exercises supine clam green x 20                        PT Short Term Goals - 01/20/21 2301       PT SHORT TERM GOAL #1   Title Pt will be Ind in an initial HEP    Baseline started on eval    Status New    Target Date 02/10/21      PT SHORT TERM GOAL #2   Title Pt will voice understanding of measures to assist in the reduction/management of pain    Status New    Target Date 02/10/21               PT Long Term Goals - 02/01/21 1750       PT LONG TERM GOAL #5   Title Pt's 5xSTS time and distance will both improve as indication of decreased pain and improved function. 5xSTS to 30 sec or less and the to 336ft    Baseline 44.5 sec and 252 ft                   Plan - 02/02/21 1041     Clinical Impression Statement Pt reports 0/10 Lumbar pain which is much improved since her fall of chair onto sacrum. Her biggest complaint is her bilateral lower thigh and calf pain which is 8/10. Marland Kitchen She is lacking neutral DF bilateral. Reviewed HEP and progressed with calf and hamstring stretching in multiple positions. She has one more approved visits and primary therapist will need to request additional at next session.    PT Treatment/Interventions ADLs/Self Care Home Management;Aquatic Therapy;Cryotherapy;Electrical Stimulation;Iontophoresis 4mg /ml Dexamethasone;Moist Heat;Traction;Ultrasound;Neuromuscular re-education;Therapeutic exercise;Therapeutic activities;Functional mobility training;Patient/family education;Manual techniques;Dry needling;Taping;Joint Manipulations    PT Next Visit Plan Request additional medicaid visits- Assess response to HEP. Assess to new ther ex/HEP; consider manual to gastrocs    PT Home Exercise Plan             Patient will benefit from skilled therapeutic intervention in order to improve the following deficits and impairments:  Abnormal gait, Decreased range of motion, Difficulty walking, Obesity, Decreased activity tolerance, Pain, Decreased strength, Postural  dysfunction  Visit Diagnosis: Chronic midline low back pain with bilateral sciatica  Pain in left lower leg  Pain in right lower leg     Problem List Patient Active Problem List   Diagnosis  Date Noted   HNP (herniated nucleus pulposus), lumbar 11/14/2020   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   Chronic pain syndrome 06/14/2020   Pharmacologic therapy 06/14/2020   Disorder of skeletal system 06/14/2020   Problems influencing health status 06/14/2020   History of marijuana use 06/14/2020   History of illicit drug use 06/14/2020   Abnormal drug screen (05/25/2020) 06/14/2020   Marijuana use 06/14/2020   Abnormal MRI, lumbar spine (05/16/2020) 06/14/2020   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, L5-S1) 06/14/2020   Lumbar lateral recess stenosis (Left: L4-5, L5-S1) 06/14/2020   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 [place], [place], [address] Phone: [phone]   Fax:  [phone]  Name: VALE PERAZA MRN: [id number] Date of Birth: June 29, 1980

## 2021-02-07 ENCOUNTER — Ambulatory Visit: Payer: Medicaid Other

## 2021-02-07 ENCOUNTER — Other Ambulatory Visit: Payer: Self-pay

## 2021-02-07 DIAGNOSIS — Z9889 Other specified postprocedural states: Secondary | ICD-10-CM

## 2021-02-07 DIAGNOSIS — M5386 Other specified dorsopathies, lumbar region: Secondary | ICD-10-CM

## 2021-02-07 DIAGNOSIS — R262 Difficulty in walking, not elsewhere classified: Secondary | ICD-10-CM

## 2021-02-07 DIAGNOSIS — M79662 Pain in left lower leg: Secondary | ICD-10-CM

## 2021-02-07 DIAGNOSIS — M79661 Pain in right lower leg: Secondary | ICD-10-CM

## 2021-02-07 DIAGNOSIS — R2689 Other abnormalities of gait and mobility: Secondary | ICD-10-CM

## 2021-02-07 DIAGNOSIS — M6281 Muscle weakness (generalized): Secondary | ICD-10-CM

## 2021-02-07 DIAGNOSIS — G8929 Other chronic pain: Secondary | ICD-10-CM

## 2021-02-08 NOTE — Therapy (Addendum)
[place], [place], [address] Phone: [phone]   Fax:  [phone]  Physical Therapy Treatment  Patient Details  Name: Melinda Stone MRN: [id number] Date of Birth: January 08, 1981 Referring Provider (PT): Marybelle Killings, MD   Encounter Date: 02/07/2021   PT End of Session - 02/07/21 0952     Visit Number 4    Number of Visits 17    Date for PT Re-Evaluation 03/18/21    Authorization Type MEDICAID [place] ACCESS 01/31/21-04/15/20    Authorization - Visit Number 3    Authorization - Number of Visits 3    Progress Note Due on Visit 10    PT Start Time 0935    PT Stop Time 1015    PT Time Calculation (min) 40 min    Activity Tolerance Patient limited by fatigue;Patient limited by pain    Behavior During Therapy [facility] for tasks assessed/performed             Past Medical History:  Diagnosis Date   Anemia    Arthritis 08/17/2020   Chronic back pain    Diabetes mellitus without complication ([place])    Ovarian cyst    Pneumonia    Polyneuropathy     Past Surgical History:  Procedure Laterality Date   CESAREAN SECTION     CHOLECYSTECTOMY  2004   LUMBAR LAMINECTOMY N/A 05/30/2020   Procedure: Lumbar five Laminectomy,  Bilateral Microdiscectomy, Left Lumbar five -Sacral one Microdiscectomy;  Surgeon: Marybelle Killings, MD;  Location: [place];  Service: Orthopedics;  Laterality: N/A;   LUMBAR LAMINECTOMY Left 11/14/2020   Procedure: LEFT LUMBAR FOUR-FIVE MICRODISCECTOMY;  Surgeon: Marybelle Killings, MD;  Location: [place];  Service: Orthopedics;  Laterality: Left;   TUBAL LIGATION  10/19/2010    There were no vitals filed for this visit.   Subjective Assessment - 02/07/21 0940     Subjective Pt reports her leg pain continues to give her the most difficulty. She notes her low back and tailbone bother her occasionally. No low back or tailbone pain today..    Pertinent History Arthritis, Chronic back pain,  Diabetes mellitus without complications, Polyneuropathy, High BMI    Diagnostic tests 12/02/20: MRI low back- IMPRESSION:  1. At L4-L5, postoperative changes with left microdiscectomy and  enhancing granulation in the left lateral canal and left  subarticular recess. Canal stenosis and left subarticular recess  stenosis is improved.  2. At L5-S1, similar moderate left paracentral disc protrusion with  moderate canal stenosis and left subarticular recess effacement.  3. Ill-defined postoperative fluid collection in the left paraspinal  soft tissues at L4-L5, measuring 2.7 x 1.4 cm.  01/10/21: Xray lumbar- not able to see the results    Patient Stated Goals To have less pain and to move with greater ease    Currently in Pain? Yes    Pain Score 8     Pain Location Leg    Pain Orientation Right;Left    Pain Descriptors / Indicators Tightness;Burning    Pain Type Chronic pain    Pain Onset More than a month ago    Pain Frequency Constant    Aggravating Factors  Activity level    Pain Relieving Factors Meds, rest and elevation    Pain Score 0   0-6/10 pain range   Pain Location Back    Pain Orientation Posterior;Lower    Pain Descriptors / Indicators Aching    Pain Type Chronic pain  Pain Onset More than a month ago    Pain Frequency Occasional    Aggravating Factors  Prolonged standing, riding in the car > 20 mins    Pain Relieving Factors Ice                [facility] PT Assessment - 02/08/21 0001       AROM   Lumbar Flexion min limitation    Lumbar Extension full limitation    Lumbar - Right Side Bend min limitation    Lumbar - Left Side Bend min limitation    Lumbar - Right Rotation full motion    Lumbar - Left Rotation full motion      Strength   Strength Assessment Site Hip    Right/Left Hip Right;Left    Right Hip Flexion 3+/5    Right Hip Extension 3+/5    Right Hip External Rotation  3+/5    Right Hip Internal Rotation 3+/5    Right Hip ABduction 3+/5    Right Hip  ADduction 3+/5    Left Hip Flexion 3+/5    Left Hip Extension 3+/5    Left Hip External Rotation 3+/5    Left Hip Internal Rotation 3+/5    Left Hip ABduction 3+/5    Left Hip ADduction 3+/5                           [facility] Adult PT Treatment/Exercise - 02/08/21 0001       Exercises   Exercises Knee/Hip;Ankle      Lumbar Exercises: Stretches   Active Hamstring Stretch 2 reps;30 seconds    Active Hamstring Stretch Limitations supine with strap    Lower Trunk Rotation 10 seconds    Lower Trunk Rotation Limitations 10 reps    Piriformis Stretch 3 reps;20 seconds      Lumbar Exercises: Aerobic   Nustep L4 x 6 minutes, UE/LE, while taking subjective and planning PT session      Lumbar Exercises: Standing   Heel Raises 10 reps   2 sets   Other Standing Lumbar Exercises alternating march  x 10 ; alternating hamstring curls x 10; hip abd x10      Lumbar Exercises: Seated   Sit to Stand 10 reps    Sit to Stand Limitations from airex without UE      Lumbar Exercises: Supine   Pelvic Tilt 20 reps    Other Supine Lumbar Exercises adductor ball squeeze x 20    Other Supine Lumbar Exercises supine clam green x 20                       PT Short Term Goals - 02/07/21 1015       PT SHORT TERM GOAL #1   Title Pt will be Ind in an initial HEP. 02/07/21 reassessment: pt reports compliance completing her HEP daily. pt returns demonstration on HEP.    Baseline started on eval    Status Achieved    Target Date 02/07/21      PT SHORT TERM GOAL #2   Title Pt will voice understanding of measures to assist in the reduction/management of pain. 02/07/21 Reassessment: Pt is using heat and cold packs, her HEP, and rest as needed to manage her low back, sacral, and LE pain    Status Achieved    Target Date 02/07/21  PT Long Term Goals - 02/08/21 0531       PT LONG TERM GOAL #1   Title Increase bilat LE strength to 4/5 for improved funtional  mobility. 02/07/21 Reassessment: Bilat hips = 3+/5 strength with MMT    Baseline 01/19/21: Bilat hip strength primarily= 3- to 3/5 strength, see flowsheets    Status On-going    Target Date 03/18/21      PT LONG TERM GOAL #2   Title Increase bilat hamstring ROM to 50d for improved lumbopelvic flexibility. 02/07/21 reassessment: L 61d, R 65d    Baseline 01/19/21: L 39d, R 43d    Status Achieved    Target Date 03/18/21      PT LONG TERM GOAL #3   Title Increase pt's trunk ROM to no or minimal limitations as indication of decreased pain and to improved functional mobility. 02/07/21 reassessment: Trunk ROM has improved to minimal limitations to full mobility and low back and scaral pain has decreased in frequency and at a lower pain level most of the time.    Baseline 01/19/21: trunk ROM: mod limitations to full mobility    Status Partially Met    Target Date 03/18/21      PT LONG TERM GOAL #4   Title Pt will report a decrease in sacral/gluteal pain to 5/10 or less with daily activities. 02/07/21 reassessment 0-6/10 range, but less frequent and consistently at a lower level    Baseline 9/10    Status On-going    Target Date 03/18/21      PT LONG TERM GOAL #5   Title Pt's 5xSTS time and 2MWT distance will both improve as indication of decreased pain and improved function. 5xSTS to 30 sec or less and the 2MWT to 366f. 02/07/21: Not reassessed, goal was developed during the last PT session on 02/02/21.    Baseline 02/02/21: 44.5 sec and 252 ft.    Status On-going    Target Date 03/18/21      Additional Long Term Goals   Additional Long Term Goals Yes      PT LONG TERM GOAL #6   Title Improve pt's ankle bilat DF to 0d (neutral) for improved quality of gait minimizing early heel off and strain of the calves. 02/07/21: Not reassessed, goal was developed during the last PT session on 02/02/21.    Baseline 02/02/21 L=-8, R =-8 Early heel off at end of stance phase of gait    Status On-going                    Plan - 02/08/21 0525     Clinical Impression Statement Pt is making appropriate progress in PT is all areas re: decreased low back and sacral pain, improved trunk ROM and hamstring flexibility, increased LE strength, understanding of HEP and compliance, and quality of functional mobility. See LTGs below. Pt's polyneurapthy leg pain continues to be an issue, but quality of mobility is improve as noted with ambualtion where the pt is walking with increase knee flexion when advancing her LEs vs. advancing LEs with straight legs. Pt will continue to benefit from skilled PT to address LE and trunk strength, ankle DF ROM, and LE polyneuropathic pain to optimize pt's functional mobility re: quality and tolerance and her QOL.    Personal Factors and Comorbidities Past/Current Experience;Time since onset of injury/illness/exacerbation;Comorbidity 3+    Comorbidities Arthritis, Chronic back pain, Diabetes mellitus without complications, Polyneuropathy, High BMI    Stability/Clinical Decision Making Evolving/Moderate complexity  Clinical Decision Making Moderate    Rehab Potential Fair    PT Frequency 2x / week    PT Duration 8 weeks    PT Treatment/Interventions ADLs/Self Care Home Management;Aquatic Therapy;Cryotherapy;Electrical Stimulation;Iontophoresis 69m/ml Dexamethasone;Moist Heat;Traction;Ultrasound;Neuromuscular re-education;Therapeutic exercise;Therapeutic activities;Functional mobility training;Patient/family education;Manual techniques;Dry needling;Taping;Joint Manipulations    PT Next Visit Plan Request additional medicaid visits. Assess response to new ther ex/HEP; consider manual to gastrocs    PT Home Exercise Plan [id number]   Consulted and Agree with Plan of Care Patient             Patient will benefit from skilled therapeutic intervention in order to improve the following deficits and impairments:  Abnormal gait, Decreased range of motion, Difficulty  walking, Obesity, Decreased activity tolerance, Pain, Decreased strength, Postural dysfunction  Visit Diagnosis: Chronic midline low back pain with bilateral sciatica  Muscle weakness (generalized)  Difficulty in walking, not elsewhere classified  Other abnormalities of gait and mobility  Pain in left lower leg  Pain in right lower leg  S/P lumbar microdiscectomy  Decreased ROM of lumbar spine     Problem List Patient Active Problem List   Diagnosis Date Noted   HNP (herniated nucleus pulposus), lumbar 11/14/2020   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   Chronic pain syndrome 06/14/2020   Pharmacologic therapy 06/14/2020   Disorder of skeletal system 06/14/2020   Problems influencing health status 06/14/2020   History of marijuana use 019/62/2297  History of illicit drug use 098/92/1194  Abnormal drug screen (05/25/2020) 06/14/2020   Marijuana use 06/14/2020   Abnormal MRI, lumbar spine (05/16/2020) 06/14/2020   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, L5-S1) 06/14/2020   Lumbar lateral recess stenosis (Left: L4-5, L5-S1) 06/14/2020   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) 06/14/2020   S/P lumbar microdiscectomy 06/07/2020   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   GI bleed 12/01/2019   Muscle spasm 11/06/2019   Sciatica 11/06/2019   Low back pain 02/04/2019   Type 2 diabetes mellitus with diabetic polyneuropathy, without long-term current use of insulin ([place] 12/03/2018   Type 2 diabetes mellitus with hyperglycemia, without long-term current use of insulin ([place] 05/15/2018    AGar PontoMS, PT 02/08/21 6:17 AM   [facility] [address]Phone: [phone]  Fax:  [phone] Name: CAYRIANA WIXMRN: [id number] of Birth: [birth year] Check all possible CPT codes: 97110- Therapeutic Exercise, [phone] Neuro Re-education, [phone]-  Gait Training, [phone]- Manual Therapy, 97530 - Therapeutic Activities, [id number]- [place] [phone]- Mechanical traction, 97014 - Electrical stimulation (unattended), [id number]- Electrical stimulation (Manual), [id number]- Iontophoresis, and [id number]- Ultrasound

## 2021-02-14 ENCOUNTER — Ambulatory Visit: Payer: Medicaid Other | Admitting: Physical Therapy

## 2021-02-14 ENCOUNTER — Other Ambulatory Visit: Payer: Self-pay

## 2021-02-14 ENCOUNTER — Encounter: Payer: Self-pay | Admitting: Physical Therapy

## 2021-02-14 DIAGNOSIS — M6281 Muscle weakness (generalized): Secondary | ICD-10-CM

## 2021-02-14 DIAGNOSIS — M79662 Pain in left lower leg: Secondary | ICD-10-CM

## 2021-02-14 DIAGNOSIS — R262 Difficulty in walking, not elsewhere classified: Secondary | ICD-10-CM

## 2021-02-14 DIAGNOSIS — Z9889 Other specified postprocedural states: Secondary | ICD-10-CM | POA: Diagnosis not present

## 2021-02-14 DIAGNOSIS — R2689 Other abnormalities of gait and mobility: Secondary | ICD-10-CM

## 2021-02-14 DIAGNOSIS — M79661 Pain in right lower leg: Secondary | ICD-10-CM

## 2021-02-14 NOTE — Therapy (Signed)
[place], [place], [address] Phone: [phone]   Fax:  [phone]  Physical Therapy Treatment  Patient Details  Name: Melinda Stone MRN: [id number] Date of Birth: 19-Aug-1980 Referring Provider (PT): Marybelle Killings, MD   Encounter Date: 02/14/2021   PT End of Session - 02/14/21 1114     Visit Number 5    Number of Visits 17    Date for PT Re-Evaluation 03/18/21    Authorization Type MEDICAID [place] ACCESS 01/31/21-02/13/21    Authorization Time Period 01/31/21-02/13/21; re-auth submitted 02/08/21- pending    Authorization - Visit Number 4    Authorization - Number of Visits 3    PT Start Time 1107    PT Stop Time 1145    PT Time Calculation (min) 38 min             Past Medical History:  Diagnosis Date   Anemia    Arthritis 08/17/2020   Chronic back pain    Diabetes mellitus without complication ([place])    Ovarian cyst    Pneumonia    Polyneuropathy     Past Surgical History:  Procedure Laterality Date   CESAREAN SECTION     CHOLECYSTECTOMY  2004   LUMBAR LAMINECTOMY N/A 05/30/2020   Procedure: Lumbar five Laminectomy,  Bilateral Microdiscectomy, Left Lumbar five -Sacral one Microdiscectomy;  Surgeon: Marybelle Killings, MD;  Location: [place];  Service: Orthopedics;  Laterality: N/A;   LUMBAR LAMINECTOMY Left 11/14/2020   Procedure: LEFT LUMBAR FOUR-FIVE MICRODISCECTOMY;  Surgeon: Marybelle Killings, MD;  Location: [facility] [facility];  Service: Orthopedics;  Laterality: Left;   TUBAL LIGATION  10/19/2010    There were no vitals filed for this visit.   Subjective Assessment - 02/14/21 1111     Subjective Left leg is a 9/10 and right leg is an 8/10-still knee and calf pain. More stiff today.    Diagnostic tests 12/02/20: MRI low back- IMPRESSION:  1. At L4-L5, postoperative changes with left microdiscectomy and  enhancing granulation in the left lateral canal and left  subarticular recess. Canal  stenosis and left subarticular recess  stenosis is improved.  2. At L5-S1, similar moderate left paracentral disc protrusion with  moderate canal stenosis and left subarticular recess effacement.  3. Ill-defined postoperative fluid collection in the left paraspinal  soft tissues at L4-L5, measuring 2.7 x 1.4 cm.  01/10/21: Xray lumbar- not able to see the results    Currently in Pain? Yes    Pain Score 9     Pain Location Leg    Pain Orientation Right;Left    Pain Descriptors / Indicators Tightness;Burning    Pain Type Chronic pain    Aggravating Factors  just wake up more stiff    Pain Relieving Factors meds, rest, elevation    Pain Score 0    Pain Location Back            [facility] Adult PT Treatment/Exercise:  Therapeutic Exercise: - Nustep L5 x 5 minutes UE/LE -step stretch for knee flexion and extension 3 x 30 sec each bilateral -bilateral heel raises 5 sec x 10 -blue rocker board - A/P and lateral rocking- 1 minute progressing to no UE support -slant board stretch x 60 sec -Sit-stand x 10 with UE assist -bridge x 10 -prone quad stretch with strap 2 x 30 sec each        PT Short Term Goals - 02/07/21 1015  PT SHORT TERM GOAL #1   Title Pt will be Ind in an initial HEP. 02/07/21 reassessment: pt reports compliance completing her HEP daily. pt returns demonstration on HEP.    Baseline started on eval    Status Achieved    Target Date 02/07/21      PT SHORT TERM GOAL #2   Title Pt will voice understanding of measures to assist in the reduction/management of pain. 02/07/21 Reassessment: Pt is using heat and cold packs, her HEP, and rest as needed to manage her low back, sacral, and LE pain    Status Achieved    Target Date 02/07/21               PT Long Term Goals - 02/08/21 0531       PT LONG TERM GOAL #1   Title Increase bilat LE strength to 4/5 for improved funtional mobility. 02/07/21 Reassessment: Bilat hips = 3+/5 strength with MMT    Baseline  01/19/21: Bilat hip strength primarily= 3- to 3/5 strength, see flowsheets    Status On-going    Target Date 03/18/21      PT LONG TERM GOAL #2   Title Increase bilat hamstring ROM to 50d for improved lumbopelvic flexibility. 02/07/21 reassessment: L 61d, R 65d    Baseline 01/19/21: L 39d, R 43d    Status Achieved    Target Date 03/18/21      PT LONG TERM GOAL #3   Title Increase pt's trunk ROM to no or minimal limitations as indication of decreased pain and to improved functional mobility. 02/07/21 reassessment: Trunk ROM has improved to minimal limitations to full mobility and low back and scaral pain has decreased in frequency and at a lower pain level most of the time.    Baseline 01/19/21: trunk ROM: mod limitations to full mobility    Status Partially Met    Target Date 03/18/21      PT LONG TERM GOAL #4   Title Pt will report a decrease in sacral/gluteal pain to 5/10 or less with daily activities. 02/07/21 reassessment 0-6/10 range, but less frequent and consistently at a lower level    Baseline 9/10    Status On-going    Target Date 03/18/21      PT LONG TERM GOAL #5   Title Pt's 5xSTS time and 2MWT distance will both improve as indication of decreased pain and improved function. 5xSTS to 30 sec or less and the 2MWT to 324f. 02/07/21: Not reassessed, goal was developed during the last PT session on 02/02/21.    Baseline 02/02/21: 44.5 sec and 252 ft.    Status On-going    Target Date 03/18/21      Additional Long Term Goals   Additional Long Term Goals Yes      PT LONG TERM GOAL #6   Title Improve pt's ankle bilat DF to 0d (neutral) for improved quality of gait minimizing early heel off and strain of the calves. 02/07/21: Not reassessed, goal was developed during the last PT session on 02/02/21.    Baseline 02/02/21 L=-8, R =-8 Early heel off at end of stance phase of gait    Status On-going                   Plan - 02/14/21 1145     Clinical Impression  Statement Melinda Stone reports 8-9/10 pain in anterior thighs and calves. Able to continue with LE strength and ROM despite high subjective reports of pain. No  c/o of increased pain during therex, however did c/o knees popping with rocker board exercises. Sit-stand from mat equuires UE assist. Trial of prone quad stretch which was tolerated well.    PT Treatment/Interventions ADLs/Self Care Home Management;Aquatic Therapy;Cryotherapy;Electrical Stimulation;Iontophoresis 91m/ml Dexamethasone;Moist Heat;Traction;Ultrasound;Neuromuscular re-education;Therapeutic exercise;Therapeutic activities;Functional mobility training;Patient/family education;Manual techniques;Dry needling;Taping;Joint Manipulations    PT Next Visit Plan Request additional medicaid visits. Assess response to new ther ex/HEP; consider manual to gastrocs    PT Home Exercise Plan [id number]            Patient will benefit from skilled therapeutic intervention in order to improve the following deficits and impairments:  Abnormal gait, Decreased range of motion, Difficulty walking, Obesity, Decreased activity tolerance, Pain, Decreased strength, Postural dysfunction  Visit Diagnosis: Muscle weakness (generalized)  Difficulty in walking, not elsewhere classified  Other abnormalities of gait and mobility  Pain in left lower leg  Pain in right lower leg     Problem List Patient Active Problem List   Diagnosis Date Noted   HNP (herniated nucleus pulposus), lumbar 11/14/2020   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   Chronic pain syndrome 06/14/2020   Pharmacologic therapy 06/14/2020   Disorder of skeletal system 06/14/2020   Problems influencing health status 06/14/2020   History of marijuana use 038/18/4037  History of illicit drug use 054/36/0677  Abnormal drug screen (05/25/2020) 06/14/2020   Marijuana use 06/14/2020   Abnormal MRI, lumbar spine (05/16/2020) 06/14/2020   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, L5-S1) 06/14/2020   Lumbar lateral recess stenosis (Left: L4-5, L5-S1) 06/14/2020   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) 06/14/2020   S/P lumbar microdiscectomy 06/07/2020   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

## 2021-02-16 ENCOUNTER — Ambulatory Visit: Payer: Medicaid Other | Attending: Orthopaedic Surgery

## 2021-02-16 ENCOUNTER — Other Ambulatory Visit: Payer: Self-pay

## 2021-02-16 DIAGNOSIS — M6281 Muscle weakness (generalized): Secondary | ICD-10-CM | POA: Diagnosis present

## 2021-02-16 DIAGNOSIS — Z9889 Other specified postprocedural states: Secondary | ICD-10-CM | POA: Diagnosis present

## 2021-02-16 DIAGNOSIS — R2689 Other abnormalities of gait and mobility: Secondary | ICD-10-CM | POA: Insufficient documentation

## 2021-02-16 DIAGNOSIS — M79662 Pain in left lower leg: Secondary | ICD-10-CM | POA: Insufficient documentation

## 2021-02-16 DIAGNOSIS — M79661 Pain in right lower leg: Secondary | ICD-10-CM | POA: Diagnosis present

## 2021-02-16 DIAGNOSIS — G8929 Other chronic pain: Secondary | ICD-10-CM | POA: Insufficient documentation

## 2021-02-16 DIAGNOSIS — M5386 Other specified dorsopathies, lumbar region: Secondary | ICD-10-CM | POA: Diagnosis present

## 2021-02-16 DIAGNOSIS — R262 Difficulty in walking, not elsewhere classified: Secondary | ICD-10-CM | POA: Insufficient documentation

## 2021-02-16 DIAGNOSIS — M5441 Lumbago with sciatica, right side: Secondary | ICD-10-CM | POA: Diagnosis present

## 2021-02-16 DIAGNOSIS — M5442 Lumbago with sciatica, left side: Secondary | ICD-10-CM | POA: Insufficient documentation

## 2021-02-16 NOTE — Therapy (Signed)
[place], [place], [address] Phone: [phone]   Fax:  [phone]  Physical Therapy Treatment  Patient Details  Name: Melinda Stone MRN: [id number] Date of Birth: [birth year] Referring Provider (PT): Marybelle Killings, MD   Encounter Date: 02/16/2021   PT End of Session - 02/17/21 0829     Visit Number 6    Number of Visits 17    Date for PT Re-Evaluation 03/18/21    Authorization Type MEDICAID [place] ACCESS    Authorization Time Period re-auth submitted 02/08/21- Auth received    Progress Note Due on Visit 10    PT Start Time 1106    PT Stop Time 1156    PT Time Calculation (min) 50 min    Activity Tolerance Patient limited by pain    Behavior During Therapy [facility] for tasks assessed/performed             Past Medical History:  Diagnosis Date   Anemia    Arthritis 08/17/2020   Chronic back pain    Diabetes mellitus without complication ([place])    Ovarian cyst    Pneumonia    Polyneuropathy     Past Surgical History:  Procedure Laterality Date   CESAREAN SECTION     CHOLECYSTECTOMY  2004   LUMBAR LAMINECTOMY N/A 05/30/2020   Procedure: Lumbar five Laminectomy,  Bilateral Microdiscectomy, Left Lumbar five -Sacral one Microdiscectomy;  Surgeon: Marybelle Killings, MD;  Location: [place];  Service: Orthopedics;  Laterality: N/A;   LUMBAR LAMINECTOMY Left 11/14/2020   Procedure: LEFT LUMBAR FOUR-FIVE MICRODISCECTOMY;  Surgeon: Marybelle Killings, MD;  Location: [place];  Service: Orthopedics;  Laterality: Left;   TUBAL LIGATION  10/19/2010    There were no vitals filed for this visit.   Subjective Assessment - 02/16/21 1116     Subjective Pt reports her low back has been bothering her since the rain yeaterday. "The more i do the more my legs hurt."    Pertinent History Arthritis, Chronic back pain, Diabetes mellitus without complications, Polyneuropathy, High BMI    Diagnostic tests 12/02/20: MRI  low back- IMPRESSION:  1. At L4-L5, postoperative changes with left microdiscectomy and  enhancing granulation in the left lateral canal and left  subarticular recess. Canal stenosis and left subarticular recess  stenosis is improved.  2. At L5-S1, similar moderate left paracentral disc protrusion with  moderate canal stenosis and left subarticular recess effacement.  3. Ill-defined postoperative fluid collection in the left paraspinal  soft tissues at L4-L5, measuring 2.7 x 1.4 cm.  01/10/21: Xray lumbar- not able to see the results    Patient Stated Goals To have less pain and to move with greater ease    Currently in Pain? Yes    Pain Score 9     Pain Location Leg    Pain Orientation Right   L LE pain is 8/10   Pain Descriptors / Indicators Tightness    Pain Type Chronic pain    Pain Onset More than a month ago    Pain Frequency Constant    Pain Score 6    Pain Location Back    Pain Orientation Posterior;Lower    Pain Descriptors / Indicators Aching    Pain Type Chronic pain    Pain Onset More than a month ago    Pain Frequency Intermittent             [facility] Adult PT Treatment/Exercise:   Therapeutic Exercise: -  Nustep L5 x 5 minutes UE/LE - SKTC 3x20", BLE - LTR, x5, 5 sec, pt reported R LBP c  L rotation c last rep - hamstring stretch, strap, 2x20", BLE - PPT, hands up thighs, x20, 3" - Marching c PPT, 10x, BLE - bridge x10 - hip add sets c ball, x15, 3" - hip abd clams, x15, 3", GTB  Modalities; - Cold pack to the low back for 15 mins  Not completed this PT session: -step stretch for knee flexion and extension 3 x 30 sec each bilateral -bilateral heel raises 5 sec x 10 -blue rocker board - A/P and lateral rocking- 1 minute progressing to no UE support -slant board stretch x 60 sec -Sit-stand x 10 with UE assist -prone quad stretch with strap 2 x 30 sec each                              PT Short Term Goals - 02/07/21 1015       PT SHORT  TERM GOAL #1   Title Pt will be Ind in an initial HEP. 02/07/21 reassessment: pt reports compliance completing her HEP daily. pt returns demonstration on HEP.    Baseline started on eval    Status Achieved    Target Date 02/07/21      PT SHORT TERM GOAL #2   Title Pt will voice understanding of measures to assist in the reduction/management of pain. 02/07/21 Reassessment: Pt is using heat and cold packs, her HEP, and rest as needed to manage her low back, sacral, and LE pain    Status Achieved    Target Date 02/07/21               PT Long Term Goals - 02/08/21 0531       PT LONG TERM GOAL #1   Title Increase bilat LE strength to 4/5 for improved funtional mobility. 02/07/21 Reassessment: Bilat hips = 3+/5 strength with MMT    Baseline 01/19/21: Bilat hip strength primarily= 3- to 3/5 strength, see flowsheets    Status On-going    Target Date 03/18/21      PT LONG TERM GOAL #2   Title Increase bilat hamstring ROM to 50d for improved lumbopelvic flexibility. 02/07/21 reassessment: L 61d, R 65d    Baseline 01/19/21: L 39d, R 43d    Status Achieved    Target Date 03/18/21      PT LONG TERM GOAL #3   Title Increase pt's trunk ROM to no or minimal limitations as indication of decreased pain and to improved functional mobility. 02/07/21 reassessment: Trunk ROM has improved to minimal limitations to full mobility and low back and scaral pain has decreased in frequency and at a lower pain level most of the time.    Baseline 01/19/21: trunk ROM: mod limitations to full mobility    Status Partially Met    Target Date 03/18/21      PT LONG TERM GOAL #4   Title Pt will report a decrease in sacral/gluteal pain to 5/10 or less with daily activities. 02/07/21 reassessment 0-6/10 range, but less frequent and consistently at a lower level    Baseline 9/10    Status On-going    Target Date 03/18/21      PT LONG TERM GOAL #5   Title Pt's 5xSTS time and 2MWT distance will both improve as  indication of decreased pain and improved function. 5xSTS to 30 sec or  less and the 2MWT to 343f. 02/07/21: Not reassessed, goal was developed during the last PT session on 02/02/21.    Baseline 02/02/21: 44.5 sec and 252 ft.    Status On-going    Target Date 03/18/21      Additional Long Term Goals   Additional Long Term Goals Yes      PT LONG TERM GOAL #6   Title Improve pt's ankle bilat DF to 0d (neutral) for improved quality of gait minimizing early heel off and strain of the calves. 02/07/21: Not reassessed, goal was developed during the last PT session on 02/02/21.    Baseline 02/02/21 L=-8, R =-8 Early heel off at end of stance phase of gait    Status On-going                   Plan - 02/17/21 08295    Clinical Impression Statement Pt presents to PT with increased LBP today. Her bilat LE pain remains the same at a reported high level. PT was completed for lumbopelvic strengthening and flexibility ther ex. Following the completion of ther ex, pt reported her low back pain was decreased to 3/10 from 7/10. A cold pack waas applied at the end of the session, and pt then reported her low back pain was 0/10. Pt was encouraged to utliize her HEP and the use of cold packs to manage her low back pain. Pt's bilat LE pain remains the same, but her quality of functional mobility is improved c transfers, bed mobility abd ambulation.    Personal Factors and Comorbidities Past/Current Experience;Time since onset of injury/illness/exacerbation;Comorbidity 3+    Comorbidities Arthritis, Chronic back pain, Diabetes mellitus without complications, Polyneuropathy, High BMI    Stability/Clinical Decision Making Evolving/Moderate complexity    Clinical Decision Making Moderate    Rehab Potential Fair    PT Frequency 2x / week    PT Duration 8 weeks    PT Treatment/Interventions ADLs/Self Care Home Management;Aquatic Therapy;Cryotherapy;Electrical Stimulation;Iontophoresis 448mml  Dexamethasone;Moist Heat;Traction;Ultrasound;Neuromuscular re-education;Therapeutic exercise;Therapeutic activities;Functional mobility training;Patient/family education;Manual techniques;Dry needling;Taping;Joint Manipulations    PT Next Visit Plan Request additional medicaid visits. Assess response to new ther ex/HEP; consider manual to gastrocs    PT Home Exercise Plan [email or web address]  Consulted and Agree with Plan of Care Patient             Patient will benefit from skilled therapeutic intervention in order to improve the following deficits and impairments:  Abnormal gait, Decreased range of motion, Difficulty walking, Obesity, Decreased activity tolerance, Pain, Decreased strength, Postural dysfunction  Visit Diagnosis: Muscle weakness (generalized)  Difficulty in walking, not elsewhere classified  Other abnormalities of gait and mobility  Pain in left lower leg  Pain in right lower leg  Chronic midline low back pain with bilateral sciatica  S/P lumbar microdiscectomy  Decreased ROM of lumbar spine     Problem List Patient Active Problem List   Diagnosis Date Noted   HNP (herniated nucleus pulposus), lumbar 11/14/2020   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   Chronic pain syndrome 06/14/2020   Pharmacologic therapy 06/14/2020   Disorder of skeletal system 06/14/2020   Problems influencing health status 06/14/2020   History of marijuana use 0378/46/9629 History of illicit drug use 0352/84/1324 Abnormal drug screen (05/25/2020) 06/14/2020   Marijuana use 06/14/2020   Abnormal MRI, lumbar spine (05/16/2020) 06/14/2020   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, L5-S1) 06/14/2020   Lumbar lateral recess stenosis (Left:  L4-5, L5-S1) 06/14/2020   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) 06/14/2020   S/P lumbar microdiscectomy 06/07/2020   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

## 2021-02-21 ENCOUNTER — Ambulatory Visit: Payer: Medicaid Other | Admitting: Physical Therapy

## 2021-02-21 ENCOUNTER — Ambulatory Visit: Payer: Medicaid Other | Admitting: Orthopaedic Surgery

## 2021-02-21 ENCOUNTER — Encounter: Payer: Self-pay | Admitting: Physical Therapy

## 2021-02-21 ENCOUNTER — Other Ambulatory Visit: Payer: Self-pay

## 2021-02-21 DIAGNOSIS — M79662 Pain in left lower leg: Secondary | ICD-10-CM

## 2021-02-21 DIAGNOSIS — M6281 Muscle weakness (generalized): Secondary | ICD-10-CM

## 2021-02-21 DIAGNOSIS — R2689 Other abnormalities of gait and mobility: Secondary | ICD-10-CM

## 2021-02-21 DIAGNOSIS — R262 Difficulty in walking, not elsewhere classified: Secondary | ICD-10-CM

## 2021-02-21 DIAGNOSIS — M79661 Pain in right lower leg: Secondary | ICD-10-CM

## 2021-02-21 NOTE — Therapy (Signed)
[place], [place], [address] Phone: [phone]   Fax:  [phone]  Physical Therapy Treatment  Patient Details  Name: Melinda Stone MRN: [id number] Date of Birth: 09-Sep-1980 Referring Provider (PT): Marybelle Killings, MD   Encounter Date: 02/21/2021   PT End of Session - 02/21/21 1320     Visit Number 7    Number of Visits 17    Date for PT Re-Evaluation 03/18/21    Authorization Type MEDICAID [place] ACCESS    Authorization Time Period re-auth submitted 02/08/21- Auth received; 02/14/21-03/13/21    Authorization - Visit Number 3    Authorization - Number of Visits 8    PT Start Time 9326    PT Stop Time 1407    PT Time Calculation (min) 50 min             Past Medical History:  Diagnosis Date   Anemia    Arthritis 08/17/2020   Chronic back pain    Diabetes mellitus without complication ([place])    Ovarian cyst    Pneumonia    Polyneuropathy     Past Surgical History:  Procedure Laterality Date   CESAREAN SECTION     CHOLECYSTECTOMY  2004   LUMBAR LAMINECTOMY N/A 05/30/2020   Procedure: Lumbar five Laminectomy,  Bilateral Microdiscectomy, Left Lumbar five -Sacral one Microdiscectomy;  Surgeon: Marybelle Killings, MD;  Location: [place];  Service: Orthopedics;  Laterality: N/A;   LUMBAR LAMINECTOMY Left 11/14/2020   Procedure: LEFT LUMBAR FOUR-FIVE MICRODISCECTOMY;  Surgeon: Marybelle Killings, MD;  Location: [place];  Service: Orthopedics;  Laterality: Left;   TUBAL LIGATION  10/19/2010    There were no vitals filed for this visit.   Subjective Assessment - 02/21/21 1321     Subjective Pt reports whole body was a 10/10 yesterday and today back and legs are 8/10, possibly related to the weather.    Diagnostic tests 12/02/20: MRI low back- IMPRESSION:  1. At L4-L5, postoperative changes with left microdiscectomy and  enhancing granulation in the left lateral canal and left  subarticular recess.  Canal stenosis and left subarticular recess  stenosis is improved.  2. At L5-S1, similar moderate left paracentral disc protrusion with  moderate canal stenosis and left subarticular recess effacement.  3. Ill-defined postoperative fluid collection in the left paraspinal  soft tissues at L4-L5, measuring 2.7 x 1.4 cm.  01/10/21: Xray lumbar- not able to see the results    Currently in Pain? Yes    Pain Score 8     Pain Location Leg    Pain Orientation Mid;Lower    Pain Descriptors / Indicators Tightness    Pain Type Chronic pain    Aggravating Factors  rain    Pain Relieving Factors meds, rest ,elevation    Pain Score 8    Pain Location Back    Pain Orientation Lower    Pain Descriptors / Indicators Aching    Pain Type Chronic pain    Aggravating Factors  prolonged standing, riding in car    Pain Relieving Factors ice              Therapeutic Exercise: Red bike L2 x 5 minutes  - SKTC 3x20", BLE - LTR, x6, 5 sec,  --prone quad stretch with strap 2 x 30 sec each  - hamstring stretch, strap, 2x20", BLE - PPT, hands up thighs, x20, 3" - bridge x10 - hip add sets c ball, x15, 3" -  hip abd clams, x15, 3", GTB  Modalities; - Cold pack to the low back for 15 mins  Not completed this PT session: - Nustep L5 x 5 minutes UE/LE Marching c PPT, 10x, BLE -step stretch for knee flexion and extension 3 x 30 sec each bilateral -bilateral heel raises 5 sec x 10 -blue rocker board - A/P and lateral rocking- 1 minute progressing to no UE support -slant board stretch x 60 sec -Sit-stand x 10 with UE assist         PT Short Term Goals - 02/07/21 1015       PT SHORT TERM GOAL #1   Title Pt will be Ind in an initial HEP. 02/07/21 reassessment: pt reports compliance completing her HEP daily. pt returns demonstration on HEP.    Baseline started on eval    Status Achieved    Target Date 02/07/21      PT SHORT TERM GOAL #2   Title Pt will voice understanding of measures to assist  in the reduction/management of pain. 02/07/21 Reassessment: Pt is using heat and cold packs, her HEP, and rest as needed to manage her low back, sacral, and LE pain    Status Achieved    Target Date 02/07/21               PT Long Term Goals - 02/08/21 0531       PT LONG TERM GOAL #1   Title Increase bilat LE strength to 4/5 for improved funtional mobility. 02/07/21 Reassessment: Bilat hips = 3+/5 strength with MMT    Baseline 01/19/21: Bilat hip strength primarily= 3- to 3/5 strength, see flowsheets    Status On-going    Target Date 03/18/21      PT LONG TERM GOAL #2   Title Increase bilat hamstring ROM to 50d for improved lumbopelvic flexibility. 02/07/21 reassessment: L 61d, R 65d    Baseline 01/19/21: L 39d, R 43d    Status Achieved    Target Date 03/18/21      PT LONG TERM GOAL #3   Title Increase pt's trunk ROM to no or minimal limitations as indication of decreased pain and to improved functional mobility. 02/07/21 reassessment: Trunk ROM has improved to minimal limitations to full mobility and low back and scaral pain has decreased in frequency and at a lower pain level most of the time.    Baseline 01/19/21: trunk ROM: mod limitations to full mobility    Status Partially Met    Target Date 03/18/21      PT LONG TERM GOAL #4   Title Pt will report a decrease in sacral/gluteal pain to 5/10 or less with daily activities. 02/07/21 reassessment 0-6/10 range, but less frequent and consistently at a lower level    Baseline 9/10    Status On-going    Target Date 03/18/21      PT LONG TERM GOAL #5   Title Pt's 5xSTS time and 2MWT distance will both improve as indication of decreased pain and improved function. 5xSTS to 30 sec or less and the 2MWT to 336f. 02/07/21: Not reassessed, goal was developed during the last PT session on 02/02/21.    Baseline 02/02/21: 44.5 sec and 252 ft.    Status On-going    Target Date 03/18/21      Additional Long Term Goals   Additional Long  Term Goals Yes      PT LONG TERM GOAL #6   Title Improve pt's ankle bilat DF to 0d (  neutral) for improved quality of gait minimizing early heel off and strain of the calves. 02/07/21: Not reassessed, goal was developed during the last PT session on 02/02/21.    Baseline 02/02/21 L=-8, R =-8 Early heel off at end of stance phase of gait    Status On-going                   Plan - 02/21/21 1353     Clinical Impression Statement Tirza reports her pain was 10/10 yesterday in back and legs and that now it has reduced to 8/10 at start of session. Continued with lumbopelvic strengthening and flexibility. She reports ice pack is helpful for her pain so applied one at end of session.    PT Treatment/Interventions ADLs/Self Care Home Management;Aquatic Therapy;Cryotherapy;Electrical Stimulation;Iontophoresis 41m/ml Dexamethasone;Moist Heat;Traction;Ultrasound;Neuromuscular re-education;Therapeutic exercise;Therapeutic activities;Functional mobility training;Patient/family education;Manual techniques;Dry needling;Taping;Joint Manipulations    PT Next Visit Plan Assess response to new ther ex/HEP;    PT Home Exercise Plan [id number]            Patient will benefit from skilled therapeutic intervention in order to improve the following deficits and impairments:  Abnormal gait, Decreased range of motion, Difficulty walking, Obesity, Decreased activity tolerance, Pain, Decreased strength, Postural dysfunction  Visit Diagnosis: Muscle weakness (generalized)  Difficulty in walking, not elsewhere classified  Other abnormalities of gait and mobility  Pain in left lower leg  Pain in right lower leg     Problem List Patient Active Problem List   Diagnosis Date Noted   HNP (herniated nucleus pulposus), lumbar 11/14/2020   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   Chronic pain syndrome 06/14/2020   Pharmacologic therapy 06/14/2020   Disorder of skeletal system 06/14/2020    Problems influencing health status 06/14/2020   History of marijuana use 029/57/4734  History of illicit drug use 003/70/9643  Abnormal drug screen (05/25/2020) 06/14/2020   Marijuana use 06/14/2020   Abnormal MRI, lumbar spine (05/16/2020) 06/14/2020   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, L5-S1) 06/14/2020   Lumbar lateral recess stenosis (Left: L4-5, L5-S1) 06/14/2020   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) 06/14/2020   S/P lumbar microdiscectomy 06/07/2020   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

## 2021-02-23 ENCOUNTER — Other Ambulatory Visit: Payer: Self-pay

## 2021-02-23 ENCOUNTER — Ambulatory Visit: Payer: Medicaid Other

## 2021-02-23 DIAGNOSIS — M5386 Other specified dorsopathies, lumbar region: Secondary | ICD-10-CM

## 2021-02-23 DIAGNOSIS — G8929 Other chronic pain: Secondary | ICD-10-CM

## 2021-02-23 DIAGNOSIS — M6281 Muscle weakness (generalized): Secondary | ICD-10-CM

## 2021-02-23 DIAGNOSIS — R2689 Other abnormalities of gait and mobility: Secondary | ICD-10-CM

## 2021-02-23 DIAGNOSIS — M5441 Lumbago with sciatica, right side: Secondary | ICD-10-CM

## 2021-02-23 DIAGNOSIS — M79662 Pain in left lower leg: Secondary | ICD-10-CM

## 2021-02-23 DIAGNOSIS — M79661 Pain in right lower leg: Secondary | ICD-10-CM

## 2021-02-23 DIAGNOSIS — R262 Difficulty in walking, not elsewhere classified: Secondary | ICD-10-CM

## 2021-02-23 DIAGNOSIS — Z9889 Other specified postprocedural states: Secondary | ICD-10-CM

## 2021-02-23 NOTE — Therapy (Signed)
[place], [place], [address] Phone: [phone]   Fax:  [phone]  Physical Therapy Treatment  Patient Details  Name: Melinda Stone MRN: [id number] Date of Birth: [birth year] Referring Provider (PT): Marybelle Killings, MD   Encounter Date: 02/23/2021   PT End of Session - 02/23/21 1124     Visit Number 8    Number of Visits 17    Date for PT Re-Evaluation 03/18/21    Authorization Type MEDICAID [place] ACCESS    Authorization Time Period re-auth submitted 02/08/21- Auth received; 02/14/21-03/13/21    Authorization - Visit Number 4    Authorization - Number of Visits 8    Progress Note Due on Visit 10    PT Start Time 1108    PT Stop Time 1159    PT Time Calculation (min) 51 min    Equipment Utilized During Treatment Other (comment)   compression socks   Activity Tolerance Patient limited by pain    Behavior During Therapy [facility] for tasks assessed/performed             Past Medical History:  Diagnosis Date   Anemia    Arthritis 08/17/2020   Chronic back pain    Diabetes mellitus without complication ([place])    Ovarian cyst    Pneumonia    Polyneuropathy     Past Surgical History:  Procedure Laterality Date   CESAREAN SECTION     CHOLECYSTECTOMY  2004   LUMBAR LAMINECTOMY N/A 05/30/2020   Procedure: Lumbar five Laminectomy,  Bilateral Microdiscectomy, Left Lumbar five -Sacral one Microdiscectomy;  Surgeon: Marybelle Killings, MD;  Location: [place];  Service: Orthopedics;  Laterality: N/A;   LUMBAR LAMINECTOMY Left 11/14/2020   Procedure: LEFT LUMBAR FOUR-FIVE MICRODISCECTOMY;  Surgeon: Marybelle Killings, MD;  Location: [place];  Service: Orthopedics;  Laterality: Left;   TUBAL LIGATION  10/19/2010    There were no vitals filed for this visit.   Subjective Assessment - 02/23/21 1112     Subjective Pt reports the more she doe, the more her legs and her low back bother her. Today is a better day  re: pain. HEP stretches help to reduce her LBP.    Pertinent History Arthritis, Chronic back pain, Diabetes mellitus without complications, Polyneuropathy, High BMI    Diagnostic tests 12/02/20: MRI low back- IMPRESSION:  1. At L4-L5, postoperative changes with left microdiscectomy and  enhancing granulation in the left lateral canal and left  subarticular recess. Canal stenosis and left subarticular recess  stenosis is improved.  2. At L5-S1, similar moderate left paracentral disc protrusion with  moderate canal stenosis and left subarticular recess effacement.  3. Ill-defined postoperative fluid collection in the left paraspinal  soft tissues at L4-L5, measuring 2.7 x 1.4 cm.  01/10/21: Xray lumbar- not able to see the results    Patient Stated Goals To have less pain and to move with greater ease    Currently in Pain? Yes    Pain Score 7     Pain Location Leg    Pain Orientation Right;Left    Pain Descriptors / Indicators Tightness;Burning;Tingling    Pain Type Chronic pain    Pain Onset More than a month ago    Pain Frequency Constant    Pain Score 4    Pain Location Back    Pain Orientation Posterior;Lower    Pain Descriptors / Indicators Aching    Pain Type Chronic pain    Pain  Onset More than a month ago    Pain Frequency Intermittent                                  [facility] Adult PT Treatment/Exercise:   Therapeutic Exercise: - Nustep L5 x 5 minutes UE/LE while taking subjective - SKTC 2x20", BLE - LTR, 5, 5 sec,  --prone quad stretch with strap 2 x 30 sec each  - hamstring stretch, strap, 2x30", BLE - PPT, hands up thighs, x20, 3" - Marching c PPT, 2x10, BLE - Standing shoulder ext, 15x, GTB - Paloff side steps 5 ftx5, 2 RTBs    Modalities; - Cold pack to the low back for 10 mins   Not completed this PT session: -step stretch for knee flexion and extension 3 x 30 sec each bilateral -bilateral heel raises 5 sec x 10 -blue rocker board - A/P and  lateral rocking- 1 minute progressing to no UE support -slant board stretch x 60 sec -Sit-stand x 10 with UE assist - bridge x10 - hip add sets c ball, x15, 3" - hip abd clams, x15, 3", GTB         PT Short Term Goals - 02/07/21 1015       PT SHORT TERM GOAL #1   Title Pt will be Ind in an initial HEP. 02/07/21 reassessment: pt reports compliance completing her HEP daily. pt returns demonstration on HEP.    Baseline started on eval    Status Achieved    Target Date 02/07/21      PT SHORT TERM GOAL #2   Title Pt will voice understanding of measures to assist in the reduction/management of pain. 02/07/21 Reassessment: Pt is using heat and cold packs, her HEP, and rest as needed to manage her low back, sacral, and LE pain    Status Achieved    Target Date 02/07/21               PT Long Term Goals - 02/08/21 0531       PT LONG TERM GOAL #1   Title Increase bilat LE strength to 4/5 for improved funtional mobility. 02/07/21 Reassessment: Bilat hips = 3+/5 strength with MMT    Baseline 01/19/21: Bilat hip strength primarily= 3- to 3/5 strength, see flowsheets    Status On-going    Target Date 03/18/21      PT LONG TERM GOAL #2   Title Increase bilat hamstring ROM to 50d for improved lumbopelvic flexibility. 02/07/21 reassessment: L 61d, R 65d    Baseline 01/19/21: L 39d, R 43d    Status Achieved    Target Date 03/18/21      PT LONG TERM GOAL #3   Title Increase pt's trunk ROM to no or minimal limitations as indication of decreased pain and to improved functional mobility. 02/07/21 reassessment: Trunk ROM has improved to minimal limitations to full mobility and low back and scaral pain has decreased in frequency and at a lower pain level most of the time.    Baseline 01/19/21: trunk ROM: mod limitations to full mobility    Status Partially Met    Target Date 03/18/21      PT LONG TERM GOAL #4   Title Pt will report a decrease in sacral/gluteal pain to 5/10 or less with  daily activities. 02/07/21 reassessment 0-6/10 range, but less frequent and consistently at a lower level    Baseline 9/10  Status On-going    Target Date 03/18/21      PT LONG TERM GOAL #5   Title Pt's 5xSTS time and 2MWT distance will both improve as indication of decreased pain and improved function. 5xSTS to 30 sec or less and the 2MWT to 332f. 02/07/21: Not reassessed, goal was developed during the last PT session on 02/02/21.    Baseline 02/02/21: 44.5 sec and 252 ft.    Status On-going    Target Date 03/18/21      Additional Long Term Goals   Additional Long Term Goals Yes      PT LONG TERM GOAL #6   Title Improve pt's ankle bilat DF to 0d (neutral) for improved quality of gait minimizing early heel off and strain of the calves. 02/07/21: Not reassessed, goal was developed during the last PT session on 02/02/21.    Baseline 02/02/21 L=-8, R =-8 Early heel off at end of stance phase of gait    Status On-going                   Plan - 02/23/21 1138     Clinical Impression Statement Pt continues to report significant LE pain which has not changed. Pt reports her low back is feeling better since the last session and her exercises are helpful in decreasing that pain. Pt completed lumbopelvic flexibility and strengthenin/stability exs with her reporting a min decrease in her baselie pain from 4/10 to 3/10. A cold pack was provided to the low back at the end of the sesiion. Pt notes cold apcks are helpful in decreasing her back pain. Pt was provided info on where she purchase a cold pack for home use.    Personal Factors and Comorbidities Past/Current Experience;Time since onset of injury/illness/exacerbation;Comorbidity 3+    Comorbidities Arthritis, Chronic back pain, Diabetes mellitus without complications, Polyneuropathy, High BMI    Stability/Clinical Decision Making Evolving/Moderate complexity    Clinical Decision Making Moderate    Rehab Potential Fair    PT Frequency  2x / week    PT Duration 8 weeks    PT Treatment/Interventions ADLs/Self Care Home Management;Aquatic Therapy;Cryotherapy;Electrical Stimulation;Iontophoresis 462mml Dexamethasone;Moist Heat;Traction;Ultrasound;Neuromuscular re-education;Therapeutic exercise;Therapeutic activities;Functional mobility training;Patient/family education;Manual techniques;Dry needling;Taping;Joint Manipulations    PT Next Visit Plan Assess response to new ther ex/HEP;    PT Home Exercise Plan [email or web address]  Consulted and Agree with Plan of Care Patient             Patient will benefit from skilled therapeutic intervention in order to improve the following deficits and impairments:  Abnormal gait, Decreased range of motion, Difficulty walking, Obesity, Decreased activity tolerance, Pain, Decreased strength, Postural dysfunction  Visit Diagnosis: Muscle weakness (generalized)  Difficulty in walking, not elsewhere classified  Other abnormalities of gait and mobility  Pain in left lower leg  Pain in right lower leg  Chronic midline low back pain with bilateral sciatica  S/P lumbar microdiscectomy  Decreased ROM of lumbar spine     Problem List Patient Active Problem List   Diagnosis Date Noted   HNP (herniated nucleus pulposus), lumbar 11/14/2020   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   Chronic pain syndrome 06/14/2020   Pharmacologic therapy 06/14/2020   Disorder of skeletal system 06/14/2020   Problems influencing health status 06/14/2020   History of marijuana use 0323/30/0762 History of illicit drug use 0326/33/3545 Abnormal drug screen (05/25/2020) 06/14/2020   Marijuana use 06/14/2020   Abnormal MRI, lumbar spine (05/16/2020) 06/14/2020  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, L5-S1) 06/14/2020   Lumbar lateral recess stenosis (Left: L4-5, L5-S1) 06/14/2020   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 [place], [place], [address] Phone: [phone]   Fax:  [phone]  Name: Melinda Stone MRN: [id number] Date of Birth: May 06, 1980

## 2021-02-28 ENCOUNTER — Ambulatory Visit: Payer: Medicaid Other | Admitting: Physical Therapy

## 2021-03-02 ENCOUNTER — Ambulatory Visit: Payer: Medicaid Other

## 2021-03-02 ENCOUNTER — Other Ambulatory Visit: Payer: Self-pay

## 2021-03-02 DIAGNOSIS — M79662 Pain in left lower leg: Secondary | ICD-10-CM

## 2021-03-02 DIAGNOSIS — R262 Difficulty in walking, not elsewhere classified: Secondary | ICD-10-CM

## 2021-03-02 DIAGNOSIS — M6281 Muscle weakness (generalized): Secondary | ICD-10-CM | POA: Diagnosis not present

## 2021-03-02 DIAGNOSIS — R2689 Other abnormalities of gait and mobility: Secondary | ICD-10-CM

## 2021-03-02 DIAGNOSIS — Z9889 Other specified postprocedural states: Secondary | ICD-10-CM

## 2021-03-02 DIAGNOSIS — G8929 Other chronic pain: Secondary | ICD-10-CM

## 2021-03-02 DIAGNOSIS — M79661 Pain in right lower leg: Secondary | ICD-10-CM

## 2021-03-02 DIAGNOSIS — M5386 Other specified dorsopathies, lumbar region: Secondary | ICD-10-CM

## 2021-03-02 NOTE — Therapy (Signed)
[place], [place], [address] Phone: [phone]   Fax:  [phone]  Physical Therapy Treatment  Patient Details  Name: Melinda Stone MRN: [id number] Date of Birth: Feb 02, 1981 Referring Provider (PT): Marybelle Killings, MD   Encounter Date: 03/02/2021   PT End of Session - 03/02/21 1109     Visit Number 9    Number of Visits 17    Date for PT Re-Evaluation 03/18/21    Authorization Type MEDICAID [place] ACCESS    Authorization Time Period re-auth submitted 02/08/21- Auth received; 02/14/21-03/13/21    Authorization - Visit Number 5    Authorization - Number of Visits 8    Progress Note Due on Visit 10    PT Start Time 1107    PT Stop Time 1157    PT Time Calculation (min) 50 min    Equipment Utilized During Treatment Other (comment)   compression socks   Activity Tolerance Patient limited by pain    Behavior During Therapy [facility] for tasks assessed/performed             Past Medical History:  Diagnosis Date   Anemia    Arthritis 08/17/2020   Chronic back pain    Diabetes mellitus without complication ([place])    Ovarian cyst    Pneumonia    Polyneuropathy     Past Surgical History:  Procedure Laterality Date   CESAREAN SECTION     CHOLECYSTECTOMY  2004   LUMBAR LAMINECTOMY N/A 05/30/2020   Procedure: Lumbar five Laminectomy,  Bilateral Microdiscectomy, Left Lumbar five -Sacral one Microdiscectomy;  Surgeon: Marybelle Killings, MD;  Location: [place];  Service: Orthopedics;  Laterality: N/A;   LUMBAR LAMINECTOMY Left 11/14/2020   Procedure: LEFT LUMBAR FOUR-FIVE MICRODISCECTOMY;  Surgeon: Marybelle Killings, MD;  Location: [place];  Service: Orthopedics;  Laterality: Left;   TUBAL LIGATION  10/19/2010    There were no vitals filed for this visit.   Subjective Assessment - 03/02/21 1111     Subjective My L knee is hurting me today, 10/10. I'm hurting more all over with the cold rainy weather. The  sciatic nerve pain on the L has bothered me since last Tues when I rode of of town. my legs hurt all the time, my low back pain flucuates, but I can decrease it with ex and cold packs, the tailbone pain from my fall has resolved. Pt reports she has ordered a cold pack.    Pertinent History Arthritis, Chronic back pain, Diabetes mellitus without complications, Polyneuropathy, High BMI    Diagnostic tests 12/02/20: MRI low back- IMPRESSION:  1. At L4-L5, postoperative changes with left microdiscectomy and  enhancing granulation in the left lateral canal and left  subarticular recess. Canal stenosis and left subarticular recess  stenosis is improved.  2. At L5-S1, similar moderate left paracentral disc protrusion with  moderate canal stenosis and left subarticular recess effacement.  3. Ill-defined postoperative fluid collection in the left paraspinal  soft tissues at L4-L5, measuring 2.7 x 1.4 cm.  01/10/21: Xray lumbar- not able to see the results    Patient Stated Goals To have less pain and to move with greater ease    Currently in Pain? Yes    Pain Score 9     Pain Orientation Right;Left    Pain Descriptors / Indicators Aching;Burning;Tingling    Pain Type Chronic pain    Pain Onset More than a month ago    Pain Frequency  Constant    Pain Score 7    Pain Location Back    Pain Orientation Posterior;Lower    Pain Descriptors / Indicators Aching    Pain Type Chronic pain    Pain Onset More than a month ago    Pain Frequency Intermittent               [facility] Adult PT Treatment/Exercise:   Therapeutic Exercise: - Nustep L5 x 5 minutes UE/LE while taking subjective - SKTC 2x20", BLE - LTR, 5, 5 sec,  - piriformis stretch, 2x, 20 sec --prone quad stretch with strap 2 x 30 sec each  - hamstring stretch, strap, 2x30", BLE - PPT, hands up thighs, x20, 3" - Marching c PPT, 2x10, BLE - bridge x10 - hip add sets c ball, x15, 3" - hip abd clams, x15, 3", GTB     Modalities; - Cold pack  to the low back for 10 mins   Not completed this PT session: -step stretch for knee flexion and extension 3 x 30 sec each bilateral -bilateral heel raises 5 sec x 10 -blue rocker board - A/P and lateral rocking- 1 minute progressing to no UE support -slant board stretch x 60 sec -Sit-stand x 10 with UE assist - Standing shoulder ext, 15x, GTB - Paloff side steps 5 ftx5, 2 RTBs                           PT Short Term Goals - 02/07/21 1015       PT SHORT TERM GOAL #1   Title Pt will be Ind in an initial HEP. 02/07/21 reassessment: pt reports compliance completing her HEP daily. pt returns demonstration on HEP.    Baseline started on eval    Status Achieved    Target Date 02/07/21      PT SHORT TERM GOAL #2   Title Pt will voice understanding of measures to assist in the reduction/management of pain. 02/07/21 Reassessment: Pt is using heat and cold packs, her HEP, and rest as needed to manage her low back, sacral, and LE pain    Status Achieved    Target Date 02/07/21               PT Long Term Goals - 02/08/21 0531       PT LONG TERM GOAL #1   Title Increase bilat LE strength to 4/5 for improved funtional mobility. 02/07/21 Reassessment: Bilat hips = 3+/5 strength with MMT    Baseline 01/19/21: Bilat hip strength primarily= 3- to 3/5 strength, see flowsheets    Status On-going    Target Date 03/18/21      PT LONG TERM GOAL #2   Title Increase bilat hamstring ROM to 50d for improved lumbopelvic flexibility. 02/07/21 reassessment: L 61d, R 65d    Baseline 01/19/21: L 39d, R 43d    Status Achieved    Target Date 03/18/21      PT LONG TERM GOAL #3   Title Increase pt's trunk ROM to no or minimal limitations as indication of decreased pain and to improved functional mobility. 02/07/21 reassessment: Trunk ROM has improved to minimal limitations to full mobility and low back and scaral pain has decreased in frequency and at a lower pain level most of the  time.    Baseline 01/19/21: trunk ROM: mod limitations to full mobility    Status Partially Met    Target Date 03/18/21  PT LONG TERM GOAL #4   Title Pt will report a decrease in sacral/gluteal pain to 5/10 or less with daily activities. 02/07/21 reassessment 0-6/10 range, but less frequent and consistently at a lower level    Baseline 9/10    Status On-going    Target Date 03/18/21      PT LONG TERM GOAL #5   Title Pt's 5xSTS time and 2MWT distance will both improve as indication of decreased pain and improved function. 5xSTS to 30 sec or less and the 2MWT to 385f. 02/07/21: Not reassessed, goal was developed during the last PT session on 02/02/21.    Baseline 02/02/21: 44.5 sec and 252 ft.    Status On-going    Target Date 03/18/21      Additional Long Term Goals   Additional Long Term Goals Yes      PT LONG TERM GOAL #6   Title Improve pt's ankle bilat DF to 0d (neutral) for improved quality of gait minimizing early heel off and strain of the calves. 02/07/21: Not reassessed, goal was developed during the last PT session on 02/02/21.    Baseline 02/02/21 L=-8, R =-8 Early heel off at end of stance phase of gait    Status On-going                   Plan - 03/02/21 1128     Personal Factors and Comorbidities Past/Current Experience;Time since onset of injury/illness/exacerbation;Comorbidity 3+    Comorbidities Arthritis, Chronic back pain, Diabetes mellitus without complications, Polyneuropathy, High BMI    Stability/Clinical Decision Making Evolving/Moderate complexity    Clinical Decision Making Moderate    Rehab Potential Fair    PT Frequency 2x / week    PT Duration 8 weeks    PT Treatment/Interventions ADLs/Self Care Home Management;Aquatic Therapy;Cryotherapy;Electrical Stimulation;Iontophoresis 452mml Dexamethasone;Moist Heat;Traction;Ultrasound;Neuromuscular re-education;Therapeutic exercise;Therapeutic activities;Functional mobility training;Patient/family  education;Manual techniques;Dry needling;Taping;Joint Manipulations    PT Next Visit Plan Assess response to new ther ex/HEP;    PT Home Exercise Plan [email or web address]  Consulted and Agree with Plan of Care Patient             Patient will benefit from skilled therapeutic intervention in order to improve the following deficits and impairments:  Abnormal gait, Decreased range of motion, Difficulty walking, Obesity, Decreased activity tolerance, Pain, Decreased strength, Postural dysfunction  Visit Diagnosis: Muscle weakness (generalized)  Difficulty in walking, not elsewhere classified  Other abnormalities of gait and mobility  Pain in left lower leg  Pain in right lower leg  Chronic midline low back pain with bilateral sciatica  S/P lumbar microdiscectomy  Decreased ROM of lumbar spine     Problem List Patient Active Problem List   Diagnosis Date Noted   HNP (herniated nucleus pulposus), lumbar 11/14/2020   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   Chronic pain syndrome 06/14/2020   Pharmacologic therapy 06/14/2020   Disorder of skeletal system 06/14/2020   Problems influencing health status 06/14/2020   History of marijuana use 0356/97/9480 History of illicit drug use 0316/55/3748 Abnormal drug screen (05/25/2020) 06/14/2020   Marijuana use 06/14/2020   Abnormal MRI, lumbar spine (05/16/2020) 06/14/2020   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 [address] [place], [place], [address] Phone: [phone]   Fax:  [phone]  Name: Melinda Stone MRN: [id number] Date of Birth: 1981-03-03

## 2021-03-07 ENCOUNTER — Ambulatory Visit: Payer: Medicaid Other | Admitting: Physical Therapy

## 2021-03-09 ENCOUNTER — Ambulatory Visit: Payer: Medicaid Other

## 2021-03-09 ENCOUNTER — Other Ambulatory Visit: Payer: Self-pay

## 2021-03-09 DIAGNOSIS — M79662 Pain in left lower leg: Secondary | ICD-10-CM

## 2021-03-09 DIAGNOSIS — Z9889 Other specified postprocedural states: Secondary | ICD-10-CM

## 2021-03-09 DIAGNOSIS — M6281 Muscle weakness (generalized): Secondary | ICD-10-CM

## 2021-03-09 DIAGNOSIS — M79661 Pain in right lower leg: Secondary | ICD-10-CM

## 2021-03-09 DIAGNOSIS — R2689 Other abnormalities of gait and mobility: Secondary | ICD-10-CM

## 2021-03-09 DIAGNOSIS — M5386 Other specified dorsopathies, lumbar region: Secondary | ICD-10-CM

## 2021-03-09 DIAGNOSIS — R262 Difficulty in walking, not elsewhere classified: Secondary | ICD-10-CM

## 2021-03-09 DIAGNOSIS — M5441 Lumbago with sciatica, right side: Secondary | ICD-10-CM

## 2021-03-09 NOTE — Therapy (Signed)
[facility] [address] [place], [place], [address] Phone: [phone]   Fax:  [phone]  Physical Therapy Treatment  Patient Details  Name: Melinda Stone MRN: [id number] Date of Birth: 1980-04-17 Referring Provider (PT): [name], MD   Encounter Date: 03/09/2021   PT End of Session - 03/09/21 1108     Visit Number 10    Number of Visits 17    Date for PT Re-Evaluation 03/18/21    Authorization Type MEDICAID [place] ACCESS    Authorization Time Period re-auth submitted 02/08/21- Auth received; 02/14/21-03/13/21    Authorization - Visit Number 6    Authorization - Number of Visits 8    Progress Note Due on Visit 10    PT Start Time 1107    PT Stop Time 1159    PT Time Calculation (min) 52 min    Equipment Utilized During Treatment Other (comment)   compression socks   Activity Tolerance Patient limited by pain    Behavior During Therapy [facility] for tasks assessed/performed             Past Medical History:  Diagnosis Date   Anemia    Arthritis 08/17/2020   Chronic back pain    Diabetes mellitus without complication (HCC)    Ovarian cyst    Pneumonia    Polyneuropathy     Past Surgical History:  Procedure Laterality Date   CESAREAN SECTION     CHOLECYSTECTOMY  2004   LUMBAR LAMINECTOMY N/A 05/30/2020   Procedure: Lumbar five Laminectomy,  Bilateral Microdiscectomy, Left Lumbar five -Sacral one Microdiscectomy;  Surgeon: [name], MD;  Location: [facility] OR;  Service: Orthopedics;  Laterality: N/A;   LUMBAR LAMINECTOMY Left 11/14/2020   Procedure: LEFT LUMBAR FOUR-FIVE MICRODISCECTOMY;  Surgeon: [name], MD;  Location: [facility] OR;  Service: Orthopedics;  Laterality: Left;   TUBAL LIGATION  10/19/2010    There were no vitals filed for this visit.   Subjective Assessment - 03/09/21 1112     Subjective Pt reports she is feeling better today. No tailbone pain. Low back pain is better.    Pertinent  History Arthritis, Chronic back pain, Diabetes mellitus without complications, Polyneuropathy, High BMI    Diagnostic tests 12/02/20: MRI low back- IMPRESSION:  1. At L4-L5, postoperative changes with left microdiscectomy and  enhancing granulation in the left lateral canal and left  subarticular recess. Canal stenosis and left subarticular recess  stenosis is improved.  2. At L5-S1, similar moderate left paracentral disc protrusion with  moderate canal stenosis and left subarticular recess effacement.  3. Ill-defined postoperative fluid collection in the left paraspinal  soft tissues at L4-L5, measuring 2.7 x 1.4 cm.  01/10/21: Xray lumbar- not able to see the results    Patient Stated Goals To have less pain and to move with greater ease    Currently in Pain? Yes    Pain Score 6     Pain Location Leg    Pain Orientation Right;Left    Pain Descriptors / Indicators Aching;Burning;Tingling    Pain Onset More than a month ago    Pain Frequency Constant    Pain Score 3    Pain Location Back    Pain Orientation Posterior;Lower    Pain Descriptors / Indicators Aching    Pain Type Chronic pain    Pain Radiating Towards no L sciatica today    Pain Onset More than a month ago    Pain Frequency Intermittent  [facility] Adult PT Treatment/Exercise:   Therapeutic Exercise: - Nustep L5 x 5 minutes UE/LE while taking subjective - SKTC 2x20", BLE - piriformis stretch, 1x, 30 sec - PPT, hands up thighs, x20, 3" - 90/90 abdominal bracing UE/LE x3 10" - Marching 90/90 heel taps c PPT, 2x10, BLE - bridge 2x10 - hip add sets c ball, x15, 3" - shoulder row x15 GTB - shoulder ext x15 GTB   Modalities; - Cold pack to the low back for 10 mins  Therapeutic Activities: - 5xSTS, 30 sec - 313ft   Not completed this PT session: -step stretch for knee flexion and extension 3 x 30 sec each bilateral -bilateral heel raises 5 sec x 10 -blue rocker board - A/P  and lateral rocking- 1 minute progressing to no UE support -slant board stretch x 60 sec -Sit-stand x 10 with UE assist - Standing shoulder ext, 15x, GTB - Paloff side steps 5 ftx5, 2 RTBs - LTR, 5, 5 sec,  - prone quad stretch with strap 2 x 30 sec each  - hamstring stretch, strap, 2x30", BLE - hip abd clams, x15, 3", GTB               PT Short Term Goals - 02/07/21 1015       PT SHORT TERM GOAL #1   Title Pt will be Ind in an initial HEP. 02/07/21 reassessment: pt reports compliance completing her HEP daily. pt returns demonstration on HEP.    Baseline started on eval    Status Achieved    Target Date 02/07/21      PT SHORT TERM GOAL #2   Title Pt will voice understanding of measures to assist in the reduction/management of pain. 02/07/21 Reassessment: Pt is using heat and cold packs, her HEP, and rest as needed to manage her low back, sacral, and LE pain    Status Achieved    Target Date 02/07/21               PT Long Term Goals - 03/09/21 1122       PT LONG TERM GOAL #5   Title Pt's 5xSTS time and distance will both improve as indication of decreased pain and improved function. 5xSTS to 30 sec or less and the to 321ft. 02/07/21: Not reassessed, goal was developed during the last PT session on 02/02/21. 03/09/21: 5xSTS c hands 30 sec ( has used hands in the past), 347ft.    Baseline 02/02/21: 44.5 sec and 252 ft.    Status Achieved    Target Date 03/09/21                   Plan - 03/09/21 1116     Clinical Impression Statement Reassessed pt's functional tests with both the 5xSTS and much improved than the initial measurements on 02/01/21. PT was completed for lumbopelvic strengthening. LE polyneuropathy pain continues to remain the same, tailbone pain has resolved, and low back varies, but is managed with meds and therex. With multiple areas of pain, pt has participated well in PT and her strength and function have improved.     Personal Factors and Comorbidities Past/Current Experience;Time since onset of injury/illness/exacerbation;Comorbidity 3+    Comorbidities Arthritis, Chronic back pain, Diabetes mellitus without complications, Polyneuropathy, High BMI    Stability/Clinical Decision Making Evolving/Moderate complexity    Clinical Decision Making Moderate    Rehab Potential Fair    PT Frequency 2x / week    PT  Duration 8 weeks    PT Treatment/Interventions ADLs/Self Care Home Management;Aquatic Therapy;Cryotherapy;Electrical Stimulation;Iontophoresis 4mg /ml Dexamethasone;Moist Heat;Traction;Ultrasound;Neuromuscular re-education;Therapeutic exercise;Therapeutic activities;Functional mobility training;Patient/family education;Manual techniques;Dry needling;Taping;Joint Manipulations    PT Next Visit Plan Assess response to new ther ex/HEP; Re-assess functional tests    PT Home Exercise Plan    Consulted and Agree with Plan of Care Patient             Patient will benefit from skilled therapeutic intervention in order to improve the following deficits and impairments:  Abnormal gait, Decreased range of motion, Difficulty walking, Obesity, Decreased activity tolerance, Pain, Decreased strength, Postural dysfunction  Visit Diagnosis: Muscle weakness (generalized)  Difficulty in walking, not elsewhere classified  Other abnormalities of gait and mobility  Pain in right lower leg  Pain in left lower leg  Chronic midline low back pain with bilateral sciatica  S/P lumbar microdiscectomy  Decreased ROM of lumbar spine     Problem List Patient Active Problem List   Diagnosis Date Noted   HNP (herniated nucleus pulposus), lumbar 11/14/2020   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   Chronic pain syndrome 06/14/2020   Pharmacologic therapy 06/14/2020   Disorder of skeletal system 06/14/2020   Problems influencing health status 06/14/2020   History of marijuana use 06/14/2020   History of  illicit drug use 06/14/2020   Abnormal drug screen (05/25/2020) 06/14/2020   Marijuana use 06/14/2020   Abnormal MRI, lumbar spine (05/16/2020) 06/14/2020   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, L5-S1) 06/14/2020   Lumbar lateral recess stenosis (Left: L4-5, L5-S1) 06/14/2020   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) 06/14/2020   S/P lumbar microdiscectomy 06/07/2020   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 [place], [place], [place] Phone: [phone]   Fax:  [phone]  Name: Melinda Stone MRN: [name] Date of Birth: 10-20-1980

## 2021-03-14 ENCOUNTER — Encounter: Payer: Self-pay | Admitting: Physical Therapy

## 2021-03-14 ENCOUNTER — Other Ambulatory Visit: Payer: Self-pay

## 2021-03-14 ENCOUNTER — Ambulatory Visit: Payer: Medicaid Other | Admitting: Physical Therapy

## 2021-03-14 DIAGNOSIS — M6281 Muscle weakness (generalized): Secondary | ICD-10-CM | POA: Diagnosis not present

## 2021-03-14 DIAGNOSIS — M79662 Pain in left lower leg: Secondary | ICD-10-CM

## 2021-03-14 DIAGNOSIS — R262 Difficulty in walking, not elsewhere classified: Secondary | ICD-10-CM

## 2021-03-14 DIAGNOSIS — M79661 Pain in right lower leg: Secondary | ICD-10-CM

## 2021-03-14 DIAGNOSIS — R2689 Other abnormalities of gait and mobility: Secondary | ICD-10-CM

## 2021-03-14 NOTE — Therapy (Signed)
[place], [place], [address] Phone: [phone]   Fax:  [phone]  Physical Therapy Treatment  Patient Details  Name: Melinda Stone MRN: [id number] Date of Birth: 1980-09-30 Referring Provider (PT): Marybelle Killings, MD   Encounter Date: 03/14/2021   PT End of Session - 03/14/21 1114     Visit Number 11    Number of Visits 17    Date for PT Re-Evaluation 03/18/21    Authorization Type MEDICAID [place] ACCESS/ Changed to [facility] which has not auth for first 27 visits    Authorization Time Period re-auth submitted 02/08/21- Auth received; 02/14/21-03/13/21-  03/14/21:  27 visit limit    Authorization - Visit Number 7    PT Start Time 1105    PT Stop Time 1143    PT Time Calculation (min) 38 min             Past Medical History:  Diagnosis Date   Anemia    Arthritis 08/17/2020   Chronic back pain    Diabetes mellitus without complication ([place])    Ovarian cyst    Pneumonia    Polyneuropathy     Past Surgical History:  Procedure Laterality Date   CESAREAN SECTION     CHOLECYSTECTOMY  2004   LUMBAR LAMINECTOMY N/A 05/30/2020   Procedure: Lumbar five Laminectomy,  Bilateral Microdiscectomy, Left Lumbar five -Sacral one Microdiscectomy;  Surgeon: Marybelle Killings, MD;  Location: [place];  Service: Orthopedics;  Laterality: N/A;   LUMBAR LAMINECTOMY Left 11/14/2020   Procedure: LEFT LUMBAR FOUR-FIVE MICRODISCECTOMY;  Surgeon: Marybelle Killings, MD;  Location: [place];  Service: Orthopedics;  Laterality: Left;   TUBAL LIGATION  10/19/2010    There were no vitals filed for this visit.   Subjective Assessment - 03/14/21 1113     Subjective Knees are tight and burning 8/10. My back is fine. I switched muscle relaxers which are making my legs cramp more.    Pertinent History Arthritis, Chronic back pain, Diabetes mellitus without complications, Polyneuropathy, High BMI    Diagnostic tests  12/02/20: MRI low back- IMPRESSION:  1. At L4-L5, postoperative changes with left microdiscectomy and  enhancing granulation in the left lateral canal and left  subarticular recess. Canal stenosis and left subarticular recess  stenosis is improved.  2. At L5-S1, similar moderate left paracentral disc protrusion with  moderate canal stenosis and left subarticular recess effacement.  3. Ill-defined postoperative fluid collection in the left paraspinal  soft tissues at L4-L5, measuring 2.7 x 1.4 cm.  01/10/21: Xray lumbar- not able to see the results    Currently in Pain? Yes    Pain Score 8     Pain Location Knee    Pain Orientation Left;Right    Pain Descriptors / Indicators Aching;Tightness    Pain Type Chronic pain    Aggravating Factors  rain, prolonged walking or prolonged sitting    Pain Relieving Factors meds, rest, elevation    Pain Score 0    Pain Location Back                [facility] PT Assessment - 03/14/21 0001       Strength   Right Hip Flexion 4-/5    Right Hip External Rotation  4+/5    Right Hip Internal Rotation 4/5    Right Hip ABduction 4-/5    Left Hip Flexion 4-/5    Left Hip External Rotation 4+/5  Left Hip Internal Rotation 4/5    Left Hip ABduction 4/5    Right Knee Flexion 4+/5    Right Knee Extension 4+/5    Left Knee Flexion 4+/5    Left Knee Extension 4+/5                  [facility] Adult PT Treatment/Exercise:   Therapeutic Exercise: - Nustep L5 x 9 minutes UE/LE while taking subjective - side hip abduction x 15 each  - PPT, hands up thighs, x20, 3" - supine gastroc stretch with strap  - Marching 90/90 heel taps c PPT, 2x10, BLE - bridge 2x10 - gastroc and soleus stretches reviewed in standing -- piriformis stretch, 1x, 30 sec   Modalities; - Cold pack to the low back for 10 mins   Therapeutic Activities: -N/A   Not completed this PT session: -step stretch for knee flexion and extension 3 x 30 sec each bilateral -bilateral heel  raises 5 sec x 10 -blue rocker board - A/P and lateral rocking- 1 minute progressing to no UE support -slant board stretch x 60 sec -Sit-stand x 10 with UE assist - Standing shoulder ext, 15x, GTB - Paloff side steps 5 ftx5, 2 RTBs - LTR, 5, 5 sec,  - prone quad stretch with strap 2 x 30 sec each  - hamstring stretch, strap, 2x30", BLE - hip abd clams, x15, 3", GTB - 90/90 abdominal bracing UE/LE x3 10" - SKTC 2x20", BLE - shoulder row x15 GTB - shoulder ext x15 GTB - hip add sets c ball, x15, 3"                      PT Short Term Goals - 02/07/21 1015       PT SHORT TERM GOAL #1   Title Pt will be Ind in an initial HEP. 02/07/21 reassessment: pt reports compliance completing her HEP daily. pt returns demonstration on HEP.    Baseline started on eval    Status Achieved    Target Date 02/07/21      PT SHORT TERM GOAL #2   Title Pt will voice understanding of measures to assist in the reduction/management of pain. 02/07/21 Reassessment: Pt is using heat and cold packs, her HEP, and rest as needed to manage her low back, sacral, and LE pain    Status Achieved    Target Date 02/07/21               PT Long Term Goals - 03/14/21 1124       PT LONG TERM GOAL #1   Title Increase bilat LE strength to 4/5 for improved funtional mobility. 02/07/21 Reassessment: Bilat hips = 3+/5 strength with MMT    Baseline 01/19/21: Bilat hip strength primarily= 3- to 3/5 strength, see flowsheets;03/14/21 4-/5 hip abduction    Time 8    Period Weeks    Status Partially Met    Target Date 03/18/21      PT LONG TERM GOAL #2   Title Increase bilat hamstring ROM to 50d for improved lumbopelvic flexibility. 02/07/21 reassessment: L 61d, R 65d    Baseline 01/19/21: L 39d, R 43d    Time 8    Period Weeks    Status Achieved    Target Date 03/18/21      PT LONG TERM GOAL #3   Title Increase pt's trunk ROM to no or minimal limitations as indication of decreased pain and to  improved functional mobility. 02/07/21  reassessment: Trunk ROM has improved to minimal limitations to full mobility and low back and scaral pain has decreased in frequency and at a lower pain level most of the time.    Baseline 01/19/21: trunk ROM: mod limitations to full mobility    Time 8    Period Weeks    Status Partially Met    Target Date 03/18/21      PT LONG TERM GOAL #4   Title Pt will report a decrease in sacral/gluteal pain to 5/10 or less with daily activities. 02/07/21 reassessment 0-6/10 range, but less frequent and consistently at a lower level; 03/14/21 reassessment: averages 5-6/10    Baseline 9/10    Time 8    Period Weeks    Status Partially Met    Target Date 03/18/21      PT LONG TERM GOAL #5   Title Pt's 5xSTS time and 2MWT distance will both improve as indication of decreased pain and improved function. 5xSTS to 30 sec or less and the 2MWT to 321f. 02/07/21: Not reassessed, goal was developed during the last PT session on 02/02/21. 03/09/21: 5xSTS c hands 30 sec ( has used hands in the past), 2MWT 3462f    Baseline 02/02/21: 44.5 sec and 252 ft.    Status Achieved    Target Date 03/09/21      PT LONG TERM GOAL #6   Title Improve pt's ankle bilat DF to 0d (neutral) for improved quality of gait minimizing early heel off and strain of the calves. 02/07/21: Not reassessed, goal was developed during the last PT session on 02/02/21: 03/14/21 reassessment: -5 AROM DF bilat    Baseline 02/02/21 L=-8, R =-8 Early heel off at end of stance phase of gait    Period Weeks    Status On-going                   Plan - 03/14/21 1132     Clinical Impression Statement Reassessed patients MMT with improvement to grossly 4/5 in bilateral hips. She continues to lack 5 degrees from neutral of DF. REviewed calf streches in open and closed chian as well as reviewed HEP.    PT Treatment/Interventions ADLs/Self Care Home Management;Aquatic Therapy;Cryotherapy;Electrical  Stimulation;Iontophoresis 24m46ml Dexamethasone;Moist Heat;Traction;Ultrasound;Neuromuscular re-education;Therapeutic exercise;Therapeutic activities;Functional mobility training;Patient/family education;Manual techniques;Dry needling;Taping;Joint Manipulations    PT Next Visit Plan discharge to HEP next visit Reassess remaining [id number]          Patient will benefit from skilled therapeutic intervention in order to improve the following deficits and impairments:  Abnormal gait, Decreased range of motion, Difficulty walking, Obesity, Decreased activity tolerance, Pain, Decreased strength, Postural dysfunction  Visit Diagnosis: Muscle weakness (generalized)  Difficulty in walking, not elsewhere classified  Other abnormalities of gait and mobility  Pain in right lower leg  Pain in left lower leg     Problem List Patient Active Problem List   Diagnosis Date Noted   HNP (herniated nucleus pulposus), lumbar 11/14/2020   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   Chronic pain syndrome 06/14/2020   Pharmacologic therapy 06/14/2020   Disorder of skeletal system 06/14/2020   Problems influencing health status 06/14/2020   History of marijuana use 05/19/53/9741History of illicit drug use 03/63/84/5364Abnormal drug screen (05/25/2020) 06/14/2020   Marijuana use 06/14/2020   Abnormal MRI, lumbar spine (05/16/2020) 06/14/2020   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, L5-S1) 06/14/2020  Lumbar lateral recess stenosis (Left: L4-5, L5-S1) 06/14/2020   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) 06/14/2020   S/P lumbar microdiscectomy 06/07/2020   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

## 2021-03-16 ENCOUNTER — Other Ambulatory Visit: Payer: Self-pay

## 2021-03-16 ENCOUNTER — Ambulatory Visit: Payer: Medicaid Other

## 2021-03-16 DIAGNOSIS — M5442 Lumbago with sciatica, left side: Secondary | ICD-10-CM

## 2021-03-16 DIAGNOSIS — M6281 Muscle weakness (generalized): Secondary | ICD-10-CM | POA: Diagnosis not present

## 2021-03-16 DIAGNOSIS — M5386 Other specified dorsopathies, lumbar region: Secondary | ICD-10-CM

## 2021-03-16 DIAGNOSIS — R2689 Other abnormalities of gait and mobility: Secondary | ICD-10-CM

## 2021-03-16 DIAGNOSIS — Z9889 Other specified postprocedural states: Secondary | ICD-10-CM

## 2021-03-16 DIAGNOSIS — M79661 Pain in right lower leg: Secondary | ICD-10-CM

## 2021-03-16 DIAGNOSIS — M79662 Pain in left lower leg: Secondary | ICD-10-CM

## 2021-03-16 DIAGNOSIS — R262 Difficulty in walking, not elsewhere classified: Secondary | ICD-10-CM

## 2021-03-16 NOTE — Therapy (Signed)
[place], [place], [address] Phone: [phone]   Fax:  [phone]  Physical Therapy Treatment/Discharge  Patient Details  Name: NILAM QUAKENBUSH MRN: [id number] Date of Birth: 05-24-1980 Referring Provider (PT): Marybelle Killings, MD   Encounter Date: 03/16/2021   PT End of Session - 03/16/21 1114     Visit Number 12    Number of Visits 17    Date for PT Re-Evaluation 03/18/21    Authorization Type MEDICAID [place] ACCESS/ Changed to [facility] which has not auth for first 27 visits    Authorization Time Period re-auth submitted 02/08/21- Auth received; 02/14/21-03/13/21-  03/14/21:  27 visit limit    Authorization - Visit Number 8    Authorization - Number of Visits 8    Progress Note Due on Visit 10    PT Start Time 1105    PT Stop Time 1145    PT Time Calculation (min) 40 min    Equipment Utilized During Treatment Other (comment)   Compression socks   Activity Tolerance Patient limited by pain    Behavior During Therapy [facility] for tasks assessed/performed             Past Medical History:  Diagnosis Date   Anemia    Arthritis 08/17/2020   Chronic back pain    Diabetes mellitus without complication ([place])    Ovarian cyst    Pneumonia    Polyneuropathy     Past Surgical History:  Procedure Laterality Date   CESAREAN SECTION     CHOLECYSTECTOMY  2004   LUMBAR LAMINECTOMY N/A 05/30/2020   Procedure: Lumbar five Laminectomy,  Bilateral Microdiscectomy, Left Lumbar five -Sacral one Microdiscectomy;  Surgeon: Marybelle Killings, MD;  Location: [place];  Service: Orthopedics;  Laterality: N/A;   LUMBAR LAMINECTOMY Left 11/14/2020   Procedure: LEFT LUMBAR FOUR-FIVE MICRODISCECTOMY;  Surgeon: Marybelle Killings, MD;  Location: [place];  Service: Orthopedics;  Laterality: Left;   TUBAL LIGATION  10/19/2010    There were no vitals filed for this visit.   Subjective Assessment - 03/16/21 1110      Subjective "My legs continue to bother me a great deal. My low back is doing better.    Pertinent History Arthritis, Chronic back pain, Diabetes mellitus without complications, Polyneuropathy, High BMI    Diagnostic tests 12/02/20: MRI low back- IMPRESSION:  1. At L4-L5, postoperative changes with left microdiscectomy and  enhancing granulation in the left lateral canal and left  subarticular recess. Canal stenosis and left subarticular recess  stenosis is improved.  2. At L5-S1, similar moderate left paracentral disc protrusion with  moderate canal stenosis and left subarticular recess effacement.  3. Ill-defined postoperative fluid collection in the left paraspinal  soft tissues at L4-L5, measuring 2.7 x 1.4 cm.  01/10/21: Xray lumbar- not able to see the results    Patient Stated Goals To have less pain and to move with greater ease    Currently in Pain? Yes    Pain Score 8     Pain Location Leg    Pain Orientation Right;Left    Pain Descriptors / Indicators Aching;Tightness    Pain Type Chronic pain    Pain Onset More than a month ago    Pain Frequency Constant    Aggravating Factors  rain, prolonged walking or prolonged sitting    Pain Relieving Factors meds, rest, elevation    Pain Score 0    Pain Location Back  Pain Orientation Posterior;Lower    Pain Descriptors / Indicators Aching    Pain Type Chronic pain    Pain Radiating Towards No L LE sciatica    Pain Onset More than a month ago    Pain Frequency Intermittent    Aggravating Factors  prolonged standing, riding in car    Pain Relieving Factors Ice                                 [facility] Adult PT Treatment/Exercise:   Therapeutic Exercise: - Nustep L5 x 9 minutes UE/LE while taking subjective - hip abduction clams x 15 - hip add sets, ball squeeze, x15, 5" - PPT, hands up thighs, x20, 3" - standing gastroc stretch, x#, 20" ea - Marching 90/90 heel taps c PPT, 2x10, BLE - bridge 2x10 - piriformis  stretch, 1x, 30 sec  - shoulder row x15 GTB       PT Education - 03/16/21 1224     Education Details Final HEP    Person(s) Educated Patient    Methods Explanation;Demonstration    Comprehension Verbalized understanding;Returned demonstration              PT Short Term Goals - 02/07/21 1015       PT SHORT TERM GOAL #1   Title Pt will be Ind in an initial HEP. 02/07/21 reassessment: pt reports compliance completing her HEP daily. pt returns demonstration on HEP.    Baseline started on eval    Status Achieved    Target Date 02/07/21      PT SHORT TERM GOAL #2   Title Pt will voice understanding of measures to assist in the reduction/management of pain. 02/07/21 Reassessment: Pt is using heat and cold packs, her HEP, and rest as needed to manage her low back, sacral, and LE pain    Status Achieved    Target Date 02/07/21               PT Long Term Goals - 03/16/21 1125       PT LONG TERM GOAL #1   Title Increase bilat LE strength to 4/5 for improved funtional mobility. 02/07/21 Reassessment: Bilat hips = 3+/5 strength with MMT. 03/14/21: B hips =4 to 4+/5; B knee= 4+ to 5/10    Baseline 01/19/21: Bilat hip strength primarily= 3- to 3/5 strength, see flowsheets;03/14/21 4-/5 hip abduction    Status Achieved    Target Date 03/16/21      PT LONG TERM GOAL #2   Title Increase bilat hamstring ROM to 50d for improved lumbopelvic flexibility. 02/07/21 reassessment: L 61d, R 65d    Baseline 01/19/21: L 39d, R 43d    Status Achieved    Target Date 02/07/21      PT LONG TERM GOAL #3   Title Increase pt's trunk ROM to no or minimal limitations as indication of decreased pain and to improved functional mobility. 02/07/21 reassessment: Trunk ROM has improved to minimal limitations to full mobility and low back and scaral pain has decreased in frequency and at a lower pain level most of the time. 03/16/22: Trunk ROM is WNLs and LBP pain has decreased to 0-5/10 and sacral pain  has resolved    Baseline 01/19/21: trunk ROM: mod limitations to full mobility    Status Achieved    Target Date 03/16/21      PT LONG TERM GOAL #4   Title Pt will  report a decrease in sacral/gluteal pain to 5/10 or less with daily activities. 02/07/21 reassessment 0-6/10 range, but less frequent and consistently at a lower level; 03/14/21 reassessment: averages 5-6/10. 03/16/22: 0/10 for sacral pain, 0-5/10 for low back.    Baseline 9/10    Status Achieved    Target Date 03/16/21      PT LONG TERM GOAL #5   Title Pt's 5xSTS time and 2MWT distance will both improve as indication of decreased pain and improved function. 5xSTS to 30 sec or less and the 2MWT to 368ft. 02/07/21: Not reassessed, goal was developed during the last PT session on 02/02/21. 03/09/21: 5xSTS c hands 30 sec ( has used hands in the past), 2MWT 387ft.    Baseline 02/02/21: 44.5 sec and 252 ft.    Status Achieved    Target Date 03/09/21      PT LONG TERM GOAL #6   Title Improve pt's ankle bilat DF to 0d (neutral) for improved quality of gait minimizing early heel off and strain of the calves. 02/07/21: Not reassessed, goal was developed during the last PT session on 02/02/21: 03/14/21 reassessment: -5 AROM DF bilat    Baseline 02/02/21 L=-8, R =-8 Early heel off at end of stance phase of gait    Period Weeks    Status Not Met    Target Date 03/16/21                   Plan - 03/16/21 1124     Clinical Impression Statement Pt completed her last PT session today. Pt has made good progress regarding her low back and sacral pain, trunk ROM and functional mobility. Except for DF goal, the remaining LTGs were met. Pt's chronic LE poltyneuropathy pain continues to be a significant issue for which she upcoming MD appts for further assessment. Pt is ind in a HEP to maintain her achieved LOF. Pt is in agreement with DC from PT services.    Personal Factors and Comorbidities Past/Current Experience;Time since onset of  injury/illness/exacerbation;Comorbidity 3+    Comorbidities Arthritis, Chronic back pain, Diabetes mellitus without complications, Polyneuropathy, High BMI    Stability/Clinical Decision Making Evolving/Moderate complexity    Clinical Decision Making Moderate    Rehab Potential Fair    PT Frequency 2x / week    PT Duration 8 weeks    PT Treatment/Interventions ADLs/Self Care Home Management;Aquatic Therapy;Cryotherapy;Electrical Stimulation;Iontophoresis 4mg /ml Dexamethasone;Moist Heat;Traction;Ultrasound;Neuromuscular re-education;Therapeutic exercise;Therapeutic activities;Functional mobility training;Patient/family education;Manual techniques;Dry needling;Taping;Joint Manipulations    PT Home Exercise Plan [email or web address]    Consulted and Agree with Plan of Care Patient             Patient will benefit from skilled therapeutic intervention in order to improve the following deficits and impairments:  Abnormal gait, Decreased range of motion, Difficulty walking, Obesity, Decreased activity tolerance, Pain, Decreased strength, Postural dysfunction  Visit Diagnosis: Muscle weakness (generalized)  Difficulty in walking, not elsewhere classified  Other abnormalities of gait and mobility  Pain in right lower leg  Chronic midline low back pain with bilateral sciatica  Pain in left lower leg  S/P lumbar microdiscectomy  Decreased ROM of lumbar spine     Problem List Patient Active Problem List   Diagnosis Date Noted   HNP (herniated nucleus pulposus), lumbar 11/14/2020   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   Chronic pain syndrome 06/14/2020   Pharmacologic therapy 06/14/2020   Disorder of skeletal system 06/14/2020   Problems influencing health status 06/14/2020   History of  marijuana use 81/85/9093   History of illicit drug use 02/07/6243   Abnormal drug screen (05/25/2020) 06/14/2020   Marijuana use 06/14/2020   Abnormal MRI, lumbar spine (05/16/2020) 06/14/2020   Lumbar  central spinal stenosis w/o neurogenic claudication (L4-5, L5-S1) 06/14/2020   Lumbar lateral recess stenosis (Left: L4-5, L5-S1) 06/14/2020   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) 06/14/2020   S/P lumbar microdiscectomy 06/07/2020   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 Patient goals were partially met. Patient is being discharged due to being pleased with the current functional level.   Gar Ponto MS, PT 03/16/21 12:43 PM    [facility] [address] [place], [place], [address] Phone: [phone]   Fax:  [phone]  Name: WESTYN KEATLEY MRN: [id number] Date of Birth: 27-Sep-1980

## 2021-03-16 NOTE — Patient Instructions (Signed)
Access Code: [email or web address] URL: [email or web address] Date: 03/16/2021 Prepared by: Joellyn Rued  Exercises Supine Lower Trunk Rotation - 1 x daily - 7 x weekly - 1 sets - 5 reps - 10 hold Hooklying Single Knee to Chest Stretch - 1 x daily - 7 x weekly - 1 sets - 3 reps - 20 hold Supine Piriformis Stretch with Foot on Ground - 1 x daily - 7 x weekly - 1 sets - 3 reps - 20 hold Beginner Bridge - 2 x daily - 7 x weekly - 1 sets - 5-10 reps - 3 hold Supine 90/90 Alternating Heel Touches with Posterior Pelvic Tilt - 1 x daily - 7 x weekly - 1 sets - 10 reps Supine Posterior Pelvic Tilt - 2 x daily - 7 x weekly - 1 sets - 5-10 reps - 3 hold Hooklying Clamshell with Resistance - 2 x daily - 7 x weekly - 1 sets - 5-10 reps - 3 hold Supine Hip Adduction Isometric with Ball - 2 x daily - 7 x weekly - 1 sets - 5-10 reps - 5 hold Hamstring stretch- use BELT - 1 x daily - 7 x weekly - 1 sets - 3 reps - 20 hold Gastroc Stretch on Wall - 1 x daily - 7 x weekly - 1 sets - 3 reps - 20 hold Standing Shoulder Row with Anchored Resistance - 1 x daily - 7 x weekly - 2 sets - 10 reps - 3 hold Shoulder extension with resistance - Neutral - 1 x daily - 7 x weekly - 2 sets - 10 reps - 3 hold

## 2021-03-26 ENCOUNTER — Emergency Department (HOSPITAL_COMMUNITY): Payer: Medicaid Other

## 2021-03-26 ENCOUNTER — Other Ambulatory Visit: Payer: Self-pay

## 2021-03-26 ENCOUNTER — Emergency Department (HOSPITAL_COMMUNITY)
Admission: EM | Admit: 2021-03-26 | Discharge: 2021-03-26 | Disposition: A | Discharge status: Home / Self Care | Payer: Medicaid Other | Attending: Emergency Medicine | Admitting: Emergency Medicine

## 2021-03-26 ENCOUNTER — Encounter (HOSPITAL_COMMUNITY): Payer: Self-pay | Admitting: Emergency Medicine

## 2021-03-26 DIAGNOSIS — N9489 Other specified conditions associated with female genital organs and menstrual cycle: Secondary | ICD-10-CM | POA: Insufficient documentation

## 2021-03-26 DIAGNOSIS — Z20822 Contact with and (suspected) exposure to covid-19: Secondary | ICD-10-CM | POA: Insufficient documentation

## 2021-03-26 DIAGNOSIS — Z7984 Long term (current) use of oral hypoglycemic drugs: Secondary | ICD-10-CM | POA: Insufficient documentation

## 2021-03-26 DIAGNOSIS — M79605 Pain in left leg: Secondary | ICD-10-CM | POA: Diagnosis not present

## 2021-03-26 DIAGNOSIS — Z794 Long term (current) use of insulin: Secondary | ICD-10-CM | POA: Insufficient documentation

## 2021-03-26 DIAGNOSIS — R739 Hyperglycemia, unspecified: Secondary | ICD-10-CM | POA: Diagnosis not present

## 2021-03-26 DIAGNOSIS — R059 Cough, unspecified: Secondary | ICD-10-CM | POA: Insufficient documentation

## 2021-03-26 DIAGNOSIS — R0602 Shortness of breath: Secondary | ICD-10-CM | POA: Diagnosis not present

## 2021-03-26 DIAGNOSIS — M79604 Pain in right leg: Secondary | ICD-10-CM

## 2021-03-26 LAB — CBC WITH DIFFERENTIAL/PLATELET
Abs Immature Granulocytes: 0.02 10*3/uL (ref 0.00–0.07)
Basophils Absolute: 0.1 10*3/uL (ref 0.0–0.1)
Basophils Relative: 1 %
Eosinophils Absolute: 0.1 10*3/uL (ref 0.0–0.5)
Eosinophils Relative: 2 %
HCT: 35 % — ABNORMAL LOW (ref 36.0–46.0)
Hemoglobin: 12 g/dL (ref 12.0–15.0)
Immature Granulocytes: 0 %
Lymphocytes Relative: 30 %
Lymphs Abs: 2.4 10*3/uL (ref 0.7–4.0)
MCH: 27 pg (ref 26.0–34.0)
MCHC: 34.3 g/dL (ref 30.0–36.0)
MCV: 78.8 fL — ABNORMAL LOW (ref 80.0–100.0)
Monocytes Absolute: 0.4 10*3/uL (ref 0.1–1.0)
Monocytes Relative: 5 %
Neutro Abs: 5 10*3/uL (ref 1.7–7.7)
Neutrophils Relative %: 62 %
Platelets: 194 10*3/uL (ref 150–400)
RBC: 4.44 MIL/uL (ref 3.87–5.11)
RDW: 19.9 % — ABNORMAL HIGH (ref 11.5–15.5)
WBC: 8 10*3/uL (ref 4.0–10.5)
nRBC: 0 % (ref 0.0–0.2)

## 2021-03-26 LAB — COMPREHENSIVE METABOLIC PANEL
ALT: 11 U/L (ref 0–44)
AST: 13 U/L — ABNORMAL LOW (ref 15–41)
Albumin: 4.2 g/dL (ref 3.5–5.0)
Alkaline Phosphatase: 118 U/L (ref 38–126)
Anion gap: 7 (ref 5–15)
BUN: 10 mg/dL (ref 6–20)
CO2: 23 mmol/L (ref 22–32)
Calcium: 8.9 mg/dL (ref 8.9–10.3)
Chloride: 105 mmol/L (ref 98–111)
Creatinine, Ser: 0.77 mg/dL (ref 0.44–1.00)
GFR, Estimated: >60 mL/min (ref >60)
Glucose, Bld: 344 mg/dL — ABNORMAL HIGH (ref 70–99)
Potassium: 3.8 mmol/L (ref 3.5–5.1)
Sodium: 135 mmol/L (ref 135–145)
Total Bilirubin: 1.4 mg/dL — ABNORMAL HIGH (ref 0.3–1.2)
Total Protein: 7.8 g/dL (ref 6.5–8.1)

## 2021-03-26 LAB — RESP PANEL BY RT-PCR (FLU A&B, COVID) ARPGX2
Influenza A by PCR: NEGATIVE
Influenza B by PCR: NEGATIVE
SARS Coronavirus 2 by RT PCR: NEGATIVE

## 2021-03-26 LAB — CBG MONITORING, ED: Glucose-Capillary: 357 mg/dL — ABNORMAL HIGH (ref 70–99)

## 2021-03-26 LAB — HCG, QUANTITATIVE, PREGNANCY: hCG, Beta Chain, Quant, S: <1 m[IU]/mL (ref <5)

## 2021-03-26 MED ORDER — OXYCODONE HCL 5 MG PO TABS
10.0000 mg | ORAL_TABLET | Freq: Once | ORAL | Status: AC
  Administered 2021-03-26: 10 mg via ORAL
  Filled 2021-03-26: qty 2

## 2021-03-26 MED ORDER — ACETAMINOPHEN 325 MG PO TABS
650.0000 mg | ORAL_TABLET | Freq: Once | ORAL | Status: AC
  Administered 2021-03-26: 650 mg via ORAL
  Filled 2021-03-26: qty 2

## 2021-03-26 NOTE — ED Triage Notes (Signed)
Patient c/o bilateral lower leg pain over the past year but has worsened over the past few days. States no relief with muscle relaxer. States blood sugars have been over 200 in the mornings over the past 5 days.

## 2021-03-26 NOTE — Discharge Instructions (Signed)
The testing did not reveal any serious problems.  Continue your current treatments at home.  Follow-up with your PCP regarding high blood sugar, and your orthopedist about the leg pain.

## 2021-03-26 NOTE — ED Provider Triage Note (Signed)
Emergency Medicine Provider Triage Evaluation Note  Melinda Stone , a 41 y.o. female  was evaluated in triage.  Pt complains of lower extremity muscle spasms and achiness. Patient states that for 3-4 days she has had ongoing lower body aches as well as cough. She states she has been taking muscle relaxers which are not improving her symptoms. Additionally, she states that her sugars have been more elevated than usual. She endorses them being >200 for about a week. Denies abdominal pain. She does endorse one episode of vomiting about 2 days ago. Denies fever.   Review of Systems  Positive: See above Negative:   Physical Exam  BP 120/78 (BP Location: Left Arm)    Pulse (!) 113    Temp 98.3 F (36.8 C) (Oral)    Resp 18    Ht 5\' 9"  (1.753 m)    Wt 92.1 kg    SpO2 100%    BMI 29.98 kg/m  Gen:   Awake, no distress   Resp:  Normal effort  MSK:   Moves extremities without difficulty  Other:  Abdomen is soft, non-tender   Medical Decision Making  Medically screening exam initiated at 12:09 PM.  Appropriate orders placed.  Melinda Stone was informed that the remainder of the evaluation will be completed by another provider, this initial triage assessment does not replace that evaluation, and the importance of remaining in the ED until their evaluation is complete.     Mickie Hillier, PA-C 03/26/21 1211

## 2021-03-26 NOTE — ED Provider Notes (Signed)
[name]   CSN: [id number] Arrival date & time: 03/26/21  1104     History  Chief Complaint  Patient presents with   Leg Pain   Hyperglycemia    Melinda Stone is a 41 y.o. female.  HPI She presents for chronic complaints of leg pain and cough.  She has a tight feeling in both of her legs.  She has a nonproductive cough with occasional shortness of breath.  She smokes cigarettes.  She denies fever, chills, vomiting, dizziness or weakness.  There are no other known active modifying factors.     Home Medications Prior to Admission medications   Medication Sig Start Date End Date Taking? Authorizing Provider  amitriptyline (ELAVIL) 25 MG tablet Take 25 mg by mouth in the morning and at bedtime.    [provider]  Blood Glucose Monitoring Suppl (ACCU-CHEK GUIDE ME) w/Device KIT Use as instructed to check blood sugar daily 07/13/20   Shamleffer, Melanie Crazier, MD  empagliflozin (JARDIANCE) 10 MG TABS tablet Take 1 tablet (10 mg total) by mouth daily. 10/10/20   Shamleffer, Melanie Crazier, MD  ferrous gluconate (FERGON) 324 MG tablet Take 1 tablet (324 mg total) by mouth daily with breakfast. 01/30/19   Jacelyn Pi, Lilia Argue, MD  gabapentin (NEURONTIN) 300 MG capsule Take 1 capsule (300 mg total) by mouth 2 (two) times daily. 12/23/20   Marybelle Killings, MD  glipiZIDE (GLUCOTROL) 10 MG tablet Take 2 tablets (20 mg total) by mouth 2 (two) times daily before a meal. 07/07/20   Shamleffer, Melanie Crazier, MD  glucose blood (ACCU-CHEK GUIDE) test strip Use as instructed to check blood sugar 2 times daily 07/13/20   Shamleffer, Melanie Crazier, MD  HYDROcodone-acetaminophen (NORCO) 7.5-325 MG tablet Take 1 tablet by mouth 2 (two) times daily. 12/05/20   [provider]  methocarbamol (ROBAXIN) 500 MG tablet Take 1 tablet (500 mg total) by mouth every 6 (six) hours as needed for muscle spasms. 11/14/20   Lanae Crumbly,  PA-C  traMADol (ULTRAM) 50 MG tablet Take 1 tablet (50 mg total) by mouth every 12 (twelve) hours as needed. 01/12/21   Marybelle Killings, MD  TRULICITY 1.77 LT/9.0ZE SOPN Inject 0.75 mg into the skin once a week. 10/13/20   [provider]      Allergies    Patient has no known allergies.    Review of Systems   Review of Systems  All other systems reviewed and are negative.  Physical Exam Updated Vital Signs BP 114/85    Pulse (!) 107    Temp 98.5 F (36.9 C) (Oral)    Resp 18    Ht 5' 9"  (1.753 m)    Wt 92.1 kg    SpO2 100%    BMI 29.98 kg/m  Physical Exam Vitals and nursing note reviewed.  Constitutional:      General: She is not in acute distress.    Appearance: She is well-developed. She is not ill-appearing, toxic-appearing or diaphoretic.  HENT:     Head: Normocephalic and atraumatic.     Right Ear: External ear normal.     Left Ear: External ear normal.     Mouth/Throat:     Mouth: Mucous membranes are moist.  Eyes:     Conjunctiva/sclera: Conjunctivae normal.     Pupils: Pupils are equal, round, and reactive to light.  Neck:     Trachea: Phonation normal.  Cardiovascular:     Rate  and Rhythm: Normal rate and regular rhythm.     Heart sounds: Normal heart sounds.  Pulmonary:     Effort: Pulmonary effort is normal.     Breath sounds: Normal breath sounds.  Abdominal:     General: There is no distension.     Palpations: Abdomen is soft.     Tenderness: There is no abdominal tenderness.  Musculoskeletal:        General: Normal range of motion.     Cervical back: Normal range of motion and neck supple.     Comments: Mild tenderness of the lower legs bilaterally, without associated swelling, deformity or joint swelling.  Skin:    General: Skin is warm and dry.  Neurological:     Mental Status: She is alert and oriented to person, place, and time.     Cranial Nerves: No cranial nerve deficit.     Sensory: No sensory deficit.     Motor: No abnormal muscle  tone.     Coordination: Coordination normal.  Psychiatric:        Mood and Affect: Mood normal.        Behavior: Behavior normal.        Thought Content: Thought content normal.        Judgment: Judgment normal.    ED Results / Procedures / Treatments   Labs (all labs ordered are listed, but only abnormal results are displayed) Labs Reviewed  COMPREHENSIVE METABOLIC PANEL - Abnormal; Notable for the following components:      Result Value   Glucose, Bld 344 (*)    AST 13 (*)    Total Bilirubin 1.4 (*)    All other components within normal limits  CBC WITH DIFFERENTIAL/PLATELET - Abnormal; Notable for the following components:   HCT 35.0 (*)    MCV 78.8 (*)    RDW 19.9 (*)    All other components within normal limits  CBG MONITORING, ED - Abnormal; Notable for the following components:   Glucose-Capillary 357 (*)    All other components within normal limits  RESP PANEL BY RT-PCR (FLU A&B, COVID) [id number]  HCG, QUANTITATIVE, PREGNANCY    EKG None  Radiology DG Chest 2 View  Result Date: 03/26/2021 CLINICAL DATA:  Cough. EXAM: CHEST - 2 VIEW COMPARISON:  None. FINDINGS: The heart size and mediastinal contours are within normal limits. Both lungs are clear. The visualized skeletal structures are unremarkable. IMPRESSION: No active cardiopulmonary disease. Electronically Signed   By: Dorise Bullion III M.D.   On: 03/26/2021 16:15    Procedures Procedures    Medications Ordered in ED Medications  oxyCODONE (Oxy IR/ROXICODONE) immediate release tablet 10 mg (10 mg Oral Given 03/26/21 1858)  acetaminophen (TYLENOL) tablet 650 mg (650 mg Oral Given 03/26/21 1858)    ED Course/ Medical Decision Making/ A&P                           Medical Decision Making   Patient Vitals for the past 24 hrs:  BP Temp Temp src Pulse Resp SpO2 Height Weight  03/26/21 1849 -- 98.5 F (36.9 C) Oral -- -- -- -- --  03/26/21 1845 114/85 -- -- (!) 107 18 100 % -- --  03/26/21 1830 116/83 -- --  (!) 107 12 100 % -- --  03/26/21 1815 119/90 -- -- (!) 110 16 100 % -- --  03/26/21 1811 113/86 -- -- (!) 110 19 100 % -- --  03/26/21 1800 (!) 120/95 -- -- (!) 101 20 100 % -- --  03/26/21 1745 112/82 -- -- (!) 104 18 100 % -- --  03/26/21 1740 -- -- -- (!) 104 18 100 % -- --  03/26/21 1735 -- -- -- (!) 104 17 100 % -- --  03/26/21 1730 119/83 -- -- (!) 103 16 100 % -- --  03/26/21 1715 126/89 -- -- (!) 104 16 100 % -- --  03/26/21 1700 115/81 -- -- (!) 104 18 100 % -- --  03/26/21 1633 (!) 139/93 -- -- (!) 107 17 100 % -- --  03/26/21 1545 128/87 -- -- (!) 108 17 100 % -- --  03/26/21 1535 118/77 -- -- (!) 110 17 100 % -- --  03/26/21 1503 127/81 -- -- (!) 108 16 100 % -- --  03/26/21 1202 -- -- -- -- -- -- 5' 9"  (1.753 m) 92.1 kg  03/26/21 1155 120/78 98.3 F (36.8 C) Oral (!) 113 18 100 % -- --    6:54 PM Reevaluation with update and discussion with patient. After initial assessment and treatment, an updated evaluation reveals she is currently complaining of headache and pain below the knees, bilaterally.  She is not currently having back pain.  She states she last saw her back surgeon, in October after an injury.  She had back surgery, in August 2022.. Illness risk, progression of symptoms, discussed. Daleen Bo    Medical Decision Making: Summary of Illness/Injury: She is presenting with complaint of ongoing problems, subacute and with limited impact on her day-to-day activities.  Critical Interventions-clinical evaluation, laboratory testing, radiography, observation and reassessment; to evaluate  Chief Complaint  Patient presents with   Leg Pain   Hyperglycemia    and assess for illness characterized as Chronic and Self-Limited   The Differential Diagnoses include musculoskeletal pain, disorders of metabolism, nonspecific neuropathy, URI, bronchitis.  I did not require  Additional Historical Information from Anyone, as the patient is a good historian.  I decided to review  pertinent External Data, and in summary reviewed twice a week rehab therapy notes, for which she is being treated for midline chronic lower back pain with bilateral sciatica.   Clinical Laboratory Tests Ordered, included CBC, Metabolic panel, and viral panel, pregnancy test . Review indicates normal except MCV low, glucose high. Emergent testing abnormality management required for stabilization-no  Radiologic Tests Ordered, included chest x-ray.  I independently Visualized: Radiograph images, which show no acute abnormality.  No evidence for fracture, edema or infiltrate.    This patient is Presenting for Evaluation of generalized pain, which does require a range of treatment options, and is a complaint that involves a moderate risk of morbidity and mortality.  Pharmaceutical Risk Management usual chronic pain medicine given in the ED     Treatment Complication Risk Evaluation indicates discharge from ED is appropriate   After These Interventions, the Patient was reevaluated and was found with nonspecific pain, currently under treatment as an outpatient by her orthopedic surgeon.  No evidence for acute infectious process.  Nonspecific elevation of glucose without evidence or metabolic instability.  I do not think patient is a candidate for prednisone treatment because of high blood sugar.  She is instructed to continue her chronic pain medicine and use heat on the affected areas and follow-up with her orthopedist for further management of her leg pain    CRITICAL CARE-no Performed by: Daleen Bo  Final Clinical Impression(s) / ED Diagnoses Final diagnoses:  Hyperglycemia  Pain in both lower extremities    Rx / DC Orders ED Discharge Orders     None         Daleen Bo, MD 03/26/21 1859

## 2021-04-11 ENCOUNTER — Encounter: Payer: Self-pay | Admitting: Orthopaedic Surgery

## 2021-04-11 ENCOUNTER — Other Ambulatory Visit: Payer: Self-pay

## 2021-04-11 ENCOUNTER — Ambulatory Visit (INDEPENDENT_AMBULATORY_CARE_PROVIDER_SITE_OTHER): Payer: Medicaid Other | Admitting: Orthopaedic Surgery

## 2021-04-11 VITALS — BP 109/75 | HR 112 | Ht 69.0 in | Wt 201.0 lb

## 2021-04-11 DIAGNOSIS — Z9889 Other specified postprocedural states: Secondary | ICD-10-CM | POA: Diagnosis not present

## 2021-04-11 DIAGNOSIS — M961 Postlaminectomy syndrome, not elsewhere classified: Secondary | ICD-10-CM | POA: Diagnosis not present

## 2021-04-11 NOTE — Progress Notes (Signed)
Office Visit Note   Patient: Melinda Stone           Date of Birth: 09-06-1980           MRN: [id number] Visit Date: 04/11/2021              Requested by: Lauretta Grill, NP [address] [place],  [place] [id number] PCP: Lauretta Grill, NP   Assessment & Plan: Visit Diagnoses:  1. S/P lumbar microdiscectomy   2. Post laminectomy syndrome     Plan: Recheck 3 months.  Melinda Stone has two-level disc dehydration and narrowing.  Continue walking program and exercises taught by physical therapy.  We discussed only surgical option would be a two-level fusion.  Recheck 3 months.  Follow-Up Instructions: Return in about 3 months (around 07/10/2021).   Orders:  No orders of the defined types were placed in this encounter.  No orders of the defined types were placed in this encounter.     Procedures: No procedures performed   Clinical Data: No additional findings.   Subjective: Chief Complaint  Patient presents with   Lower Back - Follow-up    11/14/2020 Left L4-5 microdiscectomy 05/30/2020 L5 laminectomy, bilateral microdiscectomy, Left L5-S1 microdiscectomy    HPI 41 year old female returns with ongoing problems with back pain post microdiscectomy's with removal of a massive free fragment March 2022 then repeat surgery August 2022 for again very large recurrent disc herniation at L4-5.  She is in a pain clinic.  She states her treating doctor is left the country and she has been assigned a new doctor.  She is on gabapentin and takes 800 mg 3 times a day and she states it makes her sleepy.  She states she did just as well on the lower dose.  She is on the oxycodone 5 mg states she does not want to go up higher.  She continues to have back pain and pain bilaterally more so on the right side currently in the buttocks and in the thigh.  She is amatory with slow stride gait slight external rotation of both feet.  Patient's completed physical therapy.  Review of Systems no fever chills  no bowel bladder associated symptoms all other systems are noncontributory.   Objective: Vital Signs: BP 109/75    Pulse (!) 112    Ht 5\' 9"  (1.753 m)    Wt 201 lb (91.2 kg)    BMI 29.68 kg/m   Physical Exam Constitutional:      Appearance: She is well-developed.  HENT:     Head: Normocephalic.     Right Ear: External ear normal.     Left Ear: External ear normal. There is no impacted cerumen.  Eyes:     Pupils: Pupils are equal, round, and reactive to light.  Neck:     Thyroid: No thyromegaly.     Trachea: No tracheal deviation.  Cardiovascular:     Rate and Rhythm: Normal rate.  Pulmonary:     Effort: Pulmonary effort is normal.  Abdominal:     Palpations: Abdomen is soft.  Musculoskeletal:     Cervical back: No rigidity.  Skin:    General: Skin is warm and dry.  Neurological:     Mental Status: She is alert and oriented to person, place, and time.  Psychiatric:        Behavior: Behavior normal.    Ortho Exam patient is amatory slow short stride gait feet and slight external rotation.  Abductor strong right left.  Anterior tib EHL is active.  Lumbar incisions well-healed mild tenderness at the incision site.  Specialty Comments:  No specialty comments available.  Imaging: No results found.   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) 05/15/2018   Past Medical History:  Diagnosis Date   Anemia    Arthritis 08/17/2020   Chronic back pain    Diabetes mellitus without complication (HCC)    Ovarian cyst    Pneumonia    Polyneuropathy     Family History  Problem Relation Age of Onset   Diabetes Mother    Hypertension Mother    Stroke Mother     Past Surgical History:  Procedure Laterality Date   CESAREAN SECTION     CHOLECYSTECTOMY  2004   LUMBAR LAMINECTOMY N/A 05/30/2020   Procedure: Lumbar five Laminectomy,  Bilateral Microdiscectomy, Left Lumbar five -Sacral one Microdiscectomy;  Surgeon: Marybelle Killings, MD;  Location: [facility];  Service: Orthopedics;  Laterality: N/A;   LUMBAR LAMINECTOMY Left 11/14/2020   Procedure: LEFT LUMBAR FOUR-FIVE MICRODISCECTOMY;  Surgeon: Marybelle Killings, MD;  Location: [facility];  Service: Orthopedics;  Laterality: Left;   TUBAL LIGATION  10/19/2010   Social History   Occupational History    Comment: home maker  Tobacco Use   Smoking status: Every Day    Types: Cigars   Smokeless tobacco: Never   Tobacco comments:    black and milds per patient 1-2/day  Vaping Use   Vaping Use: Never used  Substance and Sexual Activity   Alcohol use: Not Currently   Drug use: Not Currently    Types: Marijuana    Comment: per patient stopped smoking marijuana Mar 2022   Sexual activity: Yes    Birth control/protection: None

## 2021-04-12 ENCOUNTER — Encounter: Payer: Self-pay | Admitting: Internal Medicine

## 2021-04-12 ENCOUNTER — Ambulatory Visit (INDEPENDENT_AMBULATORY_CARE_PROVIDER_SITE_OTHER): Payer: Medicaid Other | Admitting: Internal Medicine

## 2021-04-12 VITALS — BP 120/74 | HR 104 | Ht 69.0 in | Wt 195.4 lb

## 2021-04-12 DIAGNOSIS — E1165 Type 2 diabetes mellitus with hyperglycemia: Secondary | ICD-10-CM | POA: Diagnosis not present

## 2021-04-12 LAB — POCT GLYCOSYLATED HEMOGLOBIN (HGB A1C): Hemoglobin A1C: 7.6 % — AB (ref 4.0–5.6)

## 2021-04-12 MED ORDER — GLIPIZIDE 10 MG PO TABS: 20.0000 mg | ORAL_TABLET | Freq: Two times a day (BID) | ORAL | 3 refills | Status: DC

## 2021-04-12 MED ORDER — TRULICITY 1.5 MG/0.5ML ~~LOC~~ SOAJ: 1.5000 mg | SUBCUTANEOUS | 3 refills | Status: DC

## 2021-04-12 NOTE — Patient Instructions (Signed)
°-   Glipizide 10 mg, TWO  tablets before Breakfast and  TWO Tablets Before supper  - Increase  Trulicity 1.5 mg weekly    HOW TO TREAT LOW BLOOD SUGARS (Blood sugar LESS THAN 70 MG/DL) Please follow the RULE OF 15 for the treatment of hypoglycemia treatment (when your (blood sugars are less than 70 mg/dL)   STEP 1: Take 15 grams of carbohydrates when your blood sugar is low, which includes:  3-4 GLUCOSE TABS  OR 3-4 OZ OF JUICE OR REGULAR SODA OR ONE TUBE OF GLUCOSE GEL    STEP 2: RECHECK blood sugar in 15 MINUTES STEP 3: If your blood sugar is still low at the 15 minute recheck --> then, go back to STEP 1 and treat AGAIN with another 15 grams of carbohydrates.

## 2021-04-12 NOTE — Progress Notes (Signed)
Name: Melinda Stone  Age/ Sex: 41 y.o., female   MRN/ DOB: [id number], May 11, 1980     PCP: Lauretta Grill, NP   Reason for Endocrinology Evaluation: Type 2 Diabetes Mellitus  Initial Endocrine Consultative Visit: 12/03/2018    PATIENT IDENTIFIER: Melinda Stone is a 41 y.o. female with a past medical history of T2DM. The patient has followed with Endocrinology clinic since 12/03/2018 for consultative assistance with management of her diabetes.  DIABETIC HISTORY:  Melinda Stone was diagnosed with DMat age 8. Metformin had caused GI issues and hypoglycemia. Her hemoglobin A1c has ranged from 7.0% in 04/2018, peaking at 7.8% in 10/2018  On her initial visit to our clinic she had an A1c of 7.8%. She was on Jardiance, she had a prescription for Trulicity and Jardiance were too costly for her. We added glipizide .   Attempted to prescribe Trulicity in 03/5174 but by 09/2020 she has not received it.  Started Jardiance 09/2020   Works at [employer] different shifts  Lives with kids 55 and 105 yrs of age   SUBJECTIVE:   During the last visit (10/10/2020): A1c 7.3 % . Continued Glipizide and started Jardiance      Today (04/12/2021): Melinda Stone is here for a follow up on diabetes.  She checks her blood sugars 1 times in the past month. The patient has not had hypoglycemic episodes since the last clinic visit.    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  Has vomiting the past 2 nights, she attributes to pain  meds,   denies heartburn    HOME DIABETES REGIMEN:  - Glipizide 10 mg 2 tabs BID  - Jardiance 10 mg daily - not taking  - Trulcity 0.75 mg weekly    METER DOWNLOAD SUMMARY: unable to download  90 day average 191 mg/dL    DIABETIC COMPLICATIONS: Microvascular complications:   B/L retinopathy , S/P injections  Denies: CKD, neuropathy  Last eye exam: Completed 08/2020    Macrovascular complications:    Denies: CAD, PVD, CVA   HISTORY:  Past Medical History:  Past Medical History:  Diagnosis Date   Anemia    Arthritis 08/17/2020   Chronic back pain    Diabetes mellitus without complication (HCC)    Ovarian cyst    Pneumonia    Polyneuropathy    Past Surgical History:  Past Surgical History:  Procedure Laterality Date   CESAREAN SECTION     CHOLECYSTECTOMY  2004   LUMBAR LAMINECTOMY N/A 05/30/2020   Procedure: Lumbar five Laminectomy,  Bilateral Microdiscectomy, Left Lumbar five -Sacral one Microdiscectomy;  Surgeon: Marybelle Killings, MD;  Location: [facility];  Service: Orthopedics;  Laterality: N/A;   LUMBAR LAMINECTOMY Left 11/14/2020   Procedure: LEFT LUMBAR FOUR-FIVE MICRODISCECTOMY;  Surgeon: Marybelle Killings, MD;  Location: [facility];  Service: Orthopedics;  Laterality: Left;   TUBAL LIGATION  10/19/2010   Social History:  reports that she has been smoking cigars. She has never used smokeless tobacco. She reports that she does not currently use alcohol. She reports that she does not currently use drugs after having used the following drugs: Marijuana. Family History:  Family History  Problem Relation Age of Onset   Diabetes Mother    Hypertension Mother    Stroke Mother      HOME MEDICATIONS: Allergies as of 04/12/2021   No Known Allergies  Medication List        Accurate as of April 12, 2021 10:09 AM. If you have any questions, ask your nurse or doctor.          STOP taking these medications    amitriptyline 25 MG tablet Commonly known as: ELAVIL Stopped by: Dorita Sciara, MD   empagliflozin 10 MG Tabs tablet Commonly known as: Jardiance Stopped by: Dorita Sciara, MD   ferrous gluconate 324 MG tablet Commonly known as: FERGON Stopped by: Dorita Sciara, MD   HYDROcodone-acetaminophen 7.5-325 MG tablet Commonly known as: NORCO Stopped by: Dorita Sciara, MD   methocarbamol 500 MG  tablet Commonly known as: Robaxin Stopped by: Dorita Sciara, MD   traMADol 50 MG tablet Commonly known as: ULTRAM Stopped by: Dorita Sciara, MD       TAKE these medications    Accu-Chek Guide Me w/Device Kit Use as instructed to check blood sugar daily   Accu-Chek Guide test strip Generic drug: glucose blood Use as instructed to check blood sugar 2 times daily   cyclobenzaprine 10 MG tablet Commonly known as: FLEXERIL Take 10 mg by mouth 3 (three) times daily.   gabapentin 300 MG capsule Commonly known as: NEURONTIN Take 1 capsule (300 mg total) by mouth 2 (two) times daily.   gabapentin 800 MG tablet Commonly known as: NEURONTIN Take 800 mg by mouth 3 (three) times daily.   glipiZIDE 10 MG tablet Commonly known as: GLUCOTROL Take 2 tablets (20 mg total) by mouth 2 (two) times daily before a meal.   oxyCODONE-acetaminophen 5-325 MG tablet Commonly known as: PERCOCET/ROXICET Take 1 tablet by mouth 4 (four) times daily.   Trulicity 2.63 FH/5.4TG Sopn Generic drug: Dulaglutide Inject 0.75 mg into the skin once a week.         OBJECTIVE:   Vital Signs: BP 120/74 (BP Location: Left Arm, Patient Position: Sitting, Cuff Size: Small)    Pulse (!) 104    Ht 5' 9" (1.753 m)    Wt 195 lb 6.4 oz (88.6 kg)    SpO2 99%    BMI 28.86 kg/m   Wt Readings from Last 3 Encounters:  04/12/21 195 lb 6.4 oz (88.6 kg)  04/11/21 201 lb (91.2 kg)  03/26/21 203 lb (92.1 kg)     Exam: General: Pt appears drowsy, c/o arthralgia   Lungs: Clear with good BS bilat with no rales, rhonchi, or wheezes  Heart: RRR   Extremities: No  pretibial edema.   Neuro: MS is good with appropriate affect, pt is alert and Ox3    DM foot exam: 10/10/2020 The skin of the feet is intact without sores or ulcerations. The pedal pulses are 2+ on right and 2+ on left. The sensation is absent to a screening 5.07, 10 gram monofilament bilaterally     DATA REVIEWED:  Lab Results   Component Value Date   HGBA1C 6.9 (A) 10/26/2020   HGBA1C 7.3 (A) 10/10/2020   HGBA1C 8.0 (A) 07/07/2020     Latest Reference Range & Units 03/26/21 16:40  Sodium 135 - 145 mmol/L 135  Potassium 3.5 - 5.1 mmol/L 3.8  Chloride 98 - 111 mmol/L 105  CO2 22 - 32 mmol/L 23  Glucose 70 - 99 mg/dL 344 (H)  BUN 6 - 20 mg/dL 10  Creatinine 0.44 - 1.00 mg/dL 0.77  Calcium 8.9 - 10.3 mg/dL 8.9  Anion gap 5 - 15  7  Alkaline Phosphatase 38 - 126 U/L  118  Albumin 3.5 - 5.0 g/dL 4.2  AST 15 - 41 U/L 13 (L)  ALT 0 - 44 U/L 11  Total Protein 6.5 - 8.1 g/dL 7.8  Total Bilirubin 0.3 - 1.2 mg/dL 1.4 (H)  GFR, Estimated >60 mL/min >60    ASSESSMENT / PLAN / RECOMMENDATIONS:   1) Type 2 Diabetes Mellitus, Optimally controlled, With Neuropathic and retinopathic complications - Most recent A1c of 7.6  %. Goal A1c < 7.0 %.    - A1c remains above goal at 7.6% -Her BG's continue to be elevated on meter review today -On her last visit here patient stated that she is not on Trulicity so I prescribed Jardiance but today she tells me she is not on Jardiance but she did start Trulicity in August 5993. -She did have 2 episodes of vomiting the last 2 night, I personally do not think this is related to Trulicity as she has been on it for 5 months without issues and there has been no recent change in dose, she believes this may have to do with certain medications that she took -I will increase her Trulicity and she was advised to monitor for GI side effects -We will not start Jardiance at this time  MEDICATIONS: Continue glipizide 10 mg 2 tabs BID  Increase Trulicity to 1.5 mg weekly  EDUCATION / INSTRUCTIONS: BG monitoring instructions: Patient is instructed to check her blood sugars 2 times a day, fasting and supper time. Call [facility] if: BG persistently < 70  I reviewed the Rule of 15 for the treatment of hypoglycemia in detail with the patient. Literature supplied.    F/U in 6  months    Signed electronically by: Mack Guise, MD  [facility]  [address] [address] [place], [place] [address] Phone: [phone] FAX: [phone]   CC: Lauretta Grill, [place] [address] Phone: [phone]  Fax: [phone]  Return to Endocrinology clinic as below: Future Appointments  Date Time Provider [place]  04/12/2021 10:10 AM Shamleffer, Melanie Crazier, MD [facility] None  05/02/2021  8:40 AM Horald Pollen, MD [facility] None  07/11/2021 10:45 AM Marybelle Killings, MD [facility] None

## 2021-04-17 ENCOUNTER — Emergency Department (HOSPITAL_COMMUNITY): Payer: Medicaid Other

## 2021-04-17 ENCOUNTER — Encounter (HOSPITAL_COMMUNITY): Payer: Self-pay | Admitting: *Deleted

## 2021-04-17 ENCOUNTER — Emergency Department (HOSPITAL_COMMUNITY)
Admission: EM | Admit: 2021-04-17 | Discharge: 2021-04-18 | Disposition: A | Discharge status: Home / Self Care | Payer: Medicaid Other | Attending: Emergency Medicine | Admitting: Emergency Medicine

## 2021-04-17 ENCOUNTER — Other Ambulatory Visit: Payer: Self-pay

## 2021-04-17 DIAGNOSIS — R112 Nausea with vomiting, unspecified: Secondary | ICD-10-CM | POA: Diagnosis not present

## 2021-04-17 DIAGNOSIS — Z7984 Long term (current) use of oral hypoglycemic drugs: Secondary | ICD-10-CM | POA: Insufficient documentation

## 2021-04-17 DIAGNOSIS — R739 Hyperglycemia, unspecified: Secondary | ICD-10-CM

## 2021-04-17 DIAGNOSIS — E1165 Type 2 diabetes mellitus with hyperglycemia: Secondary | ICD-10-CM | POA: Diagnosis not present

## 2021-04-17 DIAGNOSIS — R42 Dizziness and giddiness: Secondary | ICD-10-CM | POA: Diagnosis not present

## 2021-04-17 DIAGNOSIS — G4489 Other headache syndrome: Secondary | ICD-10-CM

## 2021-04-17 DIAGNOSIS — R519 Headache, unspecified: Secondary | ICD-10-CM | POA: Insufficient documentation

## 2021-04-17 LAB — CBC WITH DIFFERENTIAL/PLATELET
Abs Immature Granulocytes: 0.05 10*3/uL (ref 0.00–0.07)
Basophils Absolute: 0.1 10*3/uL (ref 0.0–0.1)
Basophils Relative: 1 %
Eosinophils Absolute: 0.1 10*3/uL (ref 0.0–0.5)
Eosinophils Relative: 1 %
HCT: 38.6 % (ref 36.0–46.0)
Hemoglobin: 12.6 g/dL (ref 12.0–15.0)
Immature Granulocytes: 1 %
Lymphocytes Relative: 22 %
Lymphs Abs: 2.3 10*3/uL (ref 0.7–4.0)
MCH: 27.2 pg (ref 26.0–34.0)
MCHC: 32.6 g/dL (ref 30.0–36.0)
MCV: 83.2 fL (ref 80.0–100.0)
Monocytes Absolute: 0.5 10*3/uL (ref 0.1–1.0)
Monocytes Relative: 5 %
Neutro Abs: 7.4 10*3/uL (ref 1.7–7.7)
Neutrophils Relative %: 70 %
Platelets: 207 10*3/uL (ref 150–400)
RBC: 4.64 MIL/uL (ref 3.87–5.11)
RDW: 21.6 % — ABNORMAL HIGH (ref 11.5–15.5)
WBC: 10.5 10*3/uL (ref 4.0–10.5)
nRBC: 0 % (ref 0.0–0.2)

## 2021-04-17 LAB — COMPREHENSIVE METABOLIC PANEL
ALT: 11 U/L (ref 0–44)
AST: 13 U/L — ABNORMAL LOW (ref 15–41)
Albumin: 3.8 g/dL (ref 3.5–5.0)
Alkaline Phosphatase: 115 U/L (ref 38–126)
Anion gap: 8 (ref 5–15)
BUN: 8 mg/dL (ref 6–20)
CO2: 23 mmol/L (ref 22–32)
Calcium: 8.7 mg/dL — ABNORMAL LOW (ref 8.9–10.3)
Chloride: 104 mmol/L (ref 98–111)
Creatinine, Ser: 0.54 mg/dL (ref 0.44–1.00)
GFR, Estimated: >60 mL/min (ref >60)
Glucose, Bld: 313 mg/dL — ABNORMAL HIGH (ref 70–99)
Potassium: 3.6 mmol/L (ref 3.5–5.1)
Sodium: 135 mmol/L (ref 135–145)
Total Bilirubin: 1.7 mg/dL — ABNORMAL HIGH (ref 0.3–1.2)
Total Protein: 7.2 g/dL (ref 6.5–8.1)

## 2021-04-17 LAB — URINALYSIS, ROUTINE W REFLEX MICROSCOPIC
Bacteria, UA: NONE SEEN
Bilirubin Urine: NEGATIVE
Glucose, UA: >=500 mg/dL — AB
Hgb urine dipstick: NEGATIVE
Ketones, ur: NEGATIVE mg/dL
Leukocytes,Ua: NEGATIVE
Nitrite: NEGATIVE
Protein, ur: 30 mg/dL — AB
Specific Gravity, Urine: 1.035 — ABNORMAL HIGH (ref 1.005–1.030)
pH: 5 (ref 5.0–8.0)

## 2021-04-17 LAB — CBG MONITORING, ED: Glucose-Capillary: 325 mg/dL — ABNORMAL HIGH (ref 70–99)

## 2021-04-17 LAB — BETA-HYDROXYBUTYRIC ACID: Beta-Hydroxybutyric Acid: 0.14 mmol/L (ref 0.05–0.27)

## 2021-04-17 LAB — OSMOLALITY: Osmolality: 299 mOsm/kg — ABNORMAL HIGH (ref 275–295)

## 2021-04-17 NOTE — ED Triage Notes (Signed)
Pt reports headaches for several weeks, associated with blurry vision. Nausea x 1 week. Blood sugars at home have been in the 200 in the mornings. States sent here by her provider to have a ct scan complete

## 2021-04-17 NOTE — ED Provider Triage Note (Signed)
Emergency Medicine Provider Triage Evaluation Note  Melinda Stone , a 41 y.o. female  was evaluated in triage.  Pt complains of headache for several weeks with no blurry vision.  She has been nauseous, fatigued.  Patient has been talking with her PCP who sent her to the ED for CT scan head.  Patient has been compliant on her diabetes medications.  She states her blood sugars have been around 200 at home.  Blood sugars 325 here in triage.  Will work-up for hyperglycemia.  Review of Systems  Positive: Bilateral headache, blurry vision, nausea, fatigue Negative: Abdominal pain, diarrhea, eye pain  Physical Exam  BP 115/76 (BP Location: Right Arm)    Pulse (!) 108    Temp 98.5 F (36.9 C) (Oral)    Resp 17    Ht 5\' 8"  (1.727 m)    Wt 88.5 kg    LMP 03/20/2021 (Approximate)    SpO2 100%    BMI 29.65 kg/m  Gen:   Awake, no distress, patient is somnolent Resp:  Normal effort  MSK:   Moves extremities without difficulty  Other:    Medical Decision Making  Medically screening exam initiated at 8:17 PM.  Appropriate orders placed.  Melinda Stone was informed that the remainder of the evaluation will be completed by another provider, this initial triage assessment does not replace that evaluation, and the importance of remaining in the ED until their evaluation is complete.     05/18/2021, Janell Quiet 04/17/21 2019

## 2021-04-18 LAB — CBG MONITORING, ED: Glucose-Capillary: 283 mg/dL — ABNORMAL HIGH (ref 70–99)

## 2021-04-18 MED ORDER — LACTATED RINGERS IV BOLUS
1000.0000 mL | Freq: Once | INTRAVENOUS | Status: AC
  Administered 2021-04-18: 1000 mL via INTRAVENOUS

## 2021-04-18 MED ORDER — METOCLOPRAMIDE HCL 5 MG/ML IJ SOLN
10.0000 mg | Freq: Once | INTRAMUSCULAR | Status: AC
  Administered 2021-04-18: 10 mg via INTRAVENOUS
  Filled 2021-04-18: qty 2

## 2021-04-18 MED ORDER — DIPHENHYDRAMINE HCL 50 MG/ML IJ SOLN
25.0000 mg | Freq: Once | INTRAMUSCULAR | Status: AC
  Administered 2021-04-18: 25 mg via INTRAVENOUS
  Filled 2021-04-18: qty 1

## 2021-04-18 NOTE — ED Provider Notes (Signed)
[facility] DEPT Provider Note   CSN: [id number] Arrival date & time: 04/17/21  1858     History  Chief Complaint  Patient presents with   Headache    Melinda Stone is a 41 y.o. female.  The history is provided by the patient.  Headache Pain location:  Generalized Quality:  Dull Onset quality:  Gradual Duration: 1 month. Timing:  Constant Progression:  Worsening Chronicity:  New Similar to prior headaches: yes   Relieved by:  Nothing Worsened by:  Nothing Associated symptoms: nausea and vomiting   Associated symptoms: no fever and no syncope   Patient with history of diabetes presents with headache.  Patient reports she has had headache for up to a month.  It has not become acutely worsened.  No trauma or head injuries.  No fevers.  She has had recent nausea and vomiting and mild dizziness over the past week.  No focal weakness.  No change in her visions.  She has seen her eye specialist since the headache started and no cause was found She reports she was instructed by her PCP to go to the ER for a CT scan Denies previous history of stroke No one else at home has the symptom Past Medical History:  Diagnosis Date   Anemia    Arthritis 08/17/2020   Chronic back pain    Diabetes mellitus without complication ([place])    Ovarian cyst    Pneumonia    Polyneuropathy     Home Medications Prior to Admission medications   Medication Sig Start Date End Date Taking? Authorizing Provider  Blood Glucose Monitoring Suppl (ACCU-CHEK GUIDE ME) w/Device KIT Use as instructed to check blood sugar daily 07/13/20   Shamleffer, Melanie Crazier, MD  cyclobenzaprine (FLEXERIL) 10 MG tablet Take 10 mg by mouth 3 (three) times daily. 03/17/21   [provider]  Dulaglutide (TRULICITY) 1.5 HE/5.2DP SOPN Inject 1.5 mg into the skin once a week. 04/12/21   Shamleffer, Melanie Crazier, MD  gabapentin (NEURONTIN) 300 MG capsule Take 1 capsule (300 mg  total) by mouth 2 (two) times daily. 12/23/20   Marybelle Killings, MD  gabapentin (NEURONTIN) 800 MG tablet Take 800 mg by mouth 3 (three) times daily. 04/04/21   [provider]  glipiZIDE (GLUCOTROL) 10 MG tablet Take 2 tablets (20 mg total) by mouth 2 (two) times daily before a meal. 04/12/21   Shamleffer, Melanie Crazier, MD  glucose blood (ACCU-CHEK GUIDE) test strip Use as instructed to check blood sugar 2 times daily 07/13/20   Shamleffer, Melanie Crazier, MD  oxyCODONE-acetaminophen (PERCOCET/ROXICET) 5-325 MG tablet Take 1 tablet by mouth 4 (four) times daily. 03/13/21   [provider]      Allergies    Patient has no known allergies.    Review of Systems   Review of Systems  Constitutional:  Negative for fever.  Eyes:        Denies any new visual changes  Cardiovascular:  Negative for chest pain and syncope.  Gastrointestinal:  Positive for nausea and vomiting.  Neurological:  Positive for headaches. Negative for syncope.  All other systems reviewed and are negative.  Physical Exam Updated Vital Signs BP 117/71    Pulse 99    Temp 98.2 F (36.8 C) (Oral)    Resp 16    Ht 1.727 m (5' 8" )    Wt 88.5 kg    LMP 03/20/2021 (Approximate)    SpO2 99%    BMI 29.65  kg/m  Physical Exam CONSTITUTIONAL: Well developed/well nourished HEAD: Normocephalic/atraumatic EYES: EOMI/PERRL, no nystagmus, no ptosis ENMT: Mucous membranes moist NECK: supple no meningeal signs, no bruits SPINE/BACK:entire spine nontender CV: S1/S2 noted, no murmurs/rubs/gallops noted LUNGS: Lungs are clear to auscultation bilaterally, no apparent distress ABDOMEN: soft, nontender, no rebound or guarding GU:no cva tenderness NEURO:Awake/alert, face symmetric, no arm or leg drift is noted Equal 5/5 strength with shoulder abduction, elbow flex/extension, wrist flex/extension in upper extremities and equal hand grips bilaterally Equal 5/5 strength with hip flexion,knee flex/extension, foot dorsi/plantar  flexion Cranial nerves 3/4/5/6/09/24/08/11/12 tested and intact Gait normal without ataxia No past pointing Sensation to light touch intact in all extremities EXTREMITIES: pulses normal, full ROM SKIN: warm, color normal PSYCH: no abnormalities of mood noted, alert and oriented to situation  ED Results / Procedures / Treatments   Labs (all labs ordered are listed, but only abnormal results are displayed) Labs Reviewed  COMPREHENSIVE METABOLIC PANEL - Abnormal; Notable for the following components:      Result Value   Glucose, Bld 313 (*)    Calcium 8.7 (*)    AST 13 (*)    Total Bilirubin 1.7 (*)    All other components within normal limits  CBC WITH DIFFERENTIAL/PLATELET - Abnormal; Notable for the following components:   RDW 21.6 (*)    All other components within normal limits  URINALYSIS, ROUTINE W REFLEX MICROSCOPIC - Abnormal; Notable for the following components:   APPearance HAZY (*)    Specific Gravity, Urine 1.035 (*)    Glucose, UA >=500 (*)    Protein, ur 30 (*)    All other components within normal limits  OSMOLALITY - Abnormal; Notable for the following components:   Osmolality 299 (*)    All other components within normal limits  CBG MONITORING, ED - Abnormal; Notable for the following components:   Glucose-Capillary 325 (*)    All other components within normal limits  CBG MONITORING, ED - Abnormal; Notable for the following components:   Glucose-Capillary 283 (*)    All other components within normal limits  BETA-HYDROXYBUTYRIC ACID    EKG None  Radiology CT Head Wo Contrast  Result Date: 04/17/2021 CLINICAL DATA:  Headaches for several weeks, initial encounter EXAM: CT HEAD WITHOUT CONTRAST TECHNIQUE: Contiguous axial images were obtained from the base of the skull through the vertex without intravenous contrast. RADIATION DOSE REDUCTION: This exam was performed according to the departmental dose-optimization program which includes automated exposure  control, adjustment of the mA and/or kV according to patient size and/or use of iterative reconstruction technique. COMPARISON:  None. FINDINGS: Brain: No evidence of acute infarction, hemorrhage, hydrocephalus, extra-axial collection or mass lesion/mass effect. Vascular: No hyperdense vessel or unexpected calcification. Skull: Normal. Negative for fracture or focal lesion. Sinuses/Orbits: No acute finding. Other: None. IMPRESSION: No acute intracranial abnormality noted. Electronically Signed   By: Inez Catalina M.D.   On: 04/17/2021 20:41    Procedures Procedures    Medications Ordered in ED Medications  metoCLOPramide (REGLAN) injection 10 mg (10 mg Intravenous Given 04/18/21 0329)  diphenhydrAMINE (BENADRYL) injection 25 mg (25 mg Intravenous Given 04/18/21 0328)  lactated ringers bolus 1,000 mL (1,000 mLs Intravenous New Bag/Given 04/18/21 0321)    ED Course/ Medical Decision Making/ A&P                           Medical Decision Making Risk Prescription drug management.   This patient presents to  the ED for concern of headache, this involves an extensive number of treatment options, and is a complaint that carries with it a high risk of complications and morbidity.  The differential diagnosis includes migraine, intracranial hemorrhage, subarachnoid hemorrhage, meningitis, pseudotumor, CVST  Comorbidities that complicate the patient evaluation: Patients presentation is complicated by their history of diabetes  Social Determinants of Health: Patients multiple ER visits increases the complexity of managing their presentation  Additional history obtained:  Records reviewed endocrinology  Lab Tests: I Ordered, and personally interpreted labs.  The pertinent results include: hyper glycemia  Imaging Studies ordered: I ordered imaging studies including CT scan head I independently visualized and interpreted imaging which showed no acute finding I agree with the radiologist  interpretation    Medicines ordered and prescription drug management: I ordered medication including Reglan and Benadryl for headache Reevaluation of the patient after these medicines showed that the patient    improved  Critical Interventions:       IV fluids   Reevaluation: After the interventions noted above, I reevaluated the patient and found that they have :improved  Complexity of problems addressed: Patients presentation is most consistent with  acute complicated illness/injury requiring diagnostic workup      Disposition: After consideration of the diagnostic results and the patients response to treatment,  I feel that the patent would benefit from discharge .   Patient has headache for a month.  She is already been seen by her eye specialist without any diagnosis.  Will refer to neurology.  She has no neurodeficits.  She was given IV fluids for hyperglycemia and this is improved We discussed return precautions         Final Clinical Impression(s) / ED Diagnoses Final diagnoses:  Other headache syndrome  Hyperglycemia    Rx / DC Orders ED Discharge Orders          Ordered    Ambulatory referral to Neurology       Comments: An appointment is requested in approximately: 2 weeks   04/18/21 0311              Ripley Fraise, MD 04/18/21 [phone]

## 2021-04-18 NOTE — Discharge Instructions (Signed)

## 2021-04-19 ENCOUNTER — Encounter (HOSPITAL_COMMUNITY): Payer: Self-pay

## 2021-04-19 ENCOUNTER — Emergency Department (HOSPITAL_COMMUNITY)
Admission: EM | Admit: 2021-04-19 | Discharge: 2021-04-20 | Disposition: A | Discharge status: Home / Self Care | Payer: Medicaid Other | Attending: Emergency Medicine | Admitting: Emergency Medicine

## 2021-04-19 DIAGNOSIS — E119 Type 2 diabetes mellitus without complications: Secondary | ICD-10-CM | POA: Insufficient documentation

## 2021-04-19 DIAGNOSIS — R197 Diarrhea, unspecified: Secondary | ICD-10-CM | POA: Diagnosis not present

## 2021-04-19 DIAGNOSIS — D649 Anemia, unspecified: Secondary | ICD-10-CM

## 2021-04-19 DIAGNOSIS — E876 Hypokalemia: Secondary | ICD-10-CM | POA: Diagnosis not present

## 2021-04-19 DIAGNOSIS — Z20822 Contact with and (suspected) exposure to covid-19: Secondary | ICD-10-CM | POA: Diagnosis not present

## 2021-04-19 DIAGNOSIS — R112 Nausea with vomiting, unspecified: Secondary | ICD-10-CM

## 2021-04-19 LAB — COMPREHENSIVE METABOLIC PANEL
ALT: 12 U/L (ref 0–44)
AST: 14 U/L — ABNORMAL LOW (ref 15–41)
Albumin: 3.6 g/dL (ref 3.5–5.0)
Alkaline Phosphatase: 108 U/L (ref 38–126)
Anion gap: 8 (ref 5–15)
BUN: 6 mg/dL (ref 6–20)
CO2: 23 mmol/L (ref 22–32)
Calcium: 8.5 mg/dL — ABNORMAL LOW (ref 8.9–10.3)
Chloride: 104 mmol/L (ref 98–111)
Creatinine, Ser: 0.62 mg/dL (ref 0.44–1.00)
GFR, Estimated: >60 mL/min (ref >60)
Glucose, Bld: 343 mg/dL — ABNORMAL HIGH (ref 70–99)
Potassium: 3.4 mmol/L — ABNORMAL LOW (ref 3.5–5.1)
Sodium: 135 mmol/L (ref 135–145)
Total Bilirubin: 1.4 mg/dL — ABNORMAL HIGH (ref 0.3–1.2)
Total Protein: 6.7 g/dL (ref 6.5–8.1)

## 2021-04-19 LAB — I-STAT BETA HCG BLOOD, ED (MC, WL, AP ONLY): I-stat hCG, quantitative: <5 m[IU]/mL (ref <5)

## 2021-04-19 LAB — CBC WITH DIFFERENTIAL/PLATELET
Abs Immature Granulocytes: 0.05 10*3/uL (ref 0.00–0.07)
Basophils Absolute: 0.1 10*3/uL (ref 0.0–0.1)
Basophils Relative: 1 %
Eosinophils Absolute: 0 10*3/uL (ref 0.0–0.5)
Eosinophils Relative: 0 %
HCT: 33 % — ABNORMAL LOW (ref 36.0–46.0)
Hemoglobin: 11.3 g/dL — ABNORMAL LOW (ref 12.0–15.0)
Immature Granulocytes: 0 %
Lymphocytes Relative: 24 %
Lymphs Abs: 2.7 10*3/uL (ref 0.7–4.0)
MCH: 27.4 pg (ref 26.0–34.0)
MCHC: 34.2 g/dL (ref 30.0–36.0)
MCV: 79.9 fL — ABNORMAL LOW (ref 80.0–100.0)
Monocytes Absolute: 0.5 10*3/uL (ref 0.1–1.0)
Monocytes Relative: 5 %
Neutro Abs: 7.8 10*3/uL — ABNORMAL HIGH (ref 1.7–7.7)
Neutrophils Relative %: 70 %
Platelets: 214 10*3/uL (ref 150–400)
RBC: 4.13 MIL/uL (ref 3.87–5.11)
RDW: 21.2 % — ABNORMAL HIGH (ref 11.5–15.5)
WBC: 11.3 10*3/uL — ABNORMAL HIGH (ref 4.0–10.5)
nRBC: 0 % (ref 0.0–0.2)

## 2021-04-19 LAB — URINALYSIS, ROUTINE W REFLEX MICROSCOPIC
Bilirubin Urine: NEGATIVE
Glucose, UA: >1000 mg/dL — AB
Hgb urine dipstick: NEGATIVE
Ketones, ur: NEGATIVE mg/dL
Leukocytes,Ua: NEGATIVE
Nitrite: POSITIVE — AB
Specific Gravity, Urine: 1.015 (ref 1.005–1.030)
pH: 6 (ref 5.0–8.0)

## 2021-04-19 LAB — RESP PANEL BY RT-PCR (FLU A&B, COVID) ARPGX2
Influenza A by PCR: NEGATIVE
Influenza B by PCR: NEGATIVE
SARS Coronavirus 2 by RT PCR: NEGATIVE

## 2021-04-19 LAB — CBG MONITORING, ED: Glucose-Capillary: 275 mg/dL — ABNORMAL HIGH (ref 70–99)

## 2021-04-19 NOTE — ED Triage Notes (Signed)
Patient arrived with complaints of NVD, body aches, fatigue, headaches, and cough over the last few days. Requesting Covid-19 test, no known exposures. Taking Nyquil cold and flu for symptoms.

## 2021-04-19 NOTE — ED Provider Triage Note (Signed)
Emergency Medicine Provider Triage Evaluation Note  Melinda Stone , a 41 y.o. female  was evaluated in triage.  Pt complains of Feeling unwell.  History of diabetes, blood sugars in the 300s.  Has had cough, nausea, vomiting, diarrhea over the last 3 days.  Review of Systems  Positive: Nausea, vomiting, diarrhea, cough Negative: Back pain, abdominal pain  Physical Exam  LMP 03/20/2021 (Approximate)  Gen:   Awake, no distress   Resp:  Normal effort  MSK:   Moves extremities without difficulty  Other:    Medical Decision Making  Medically screening exam initiated at 8:10 PM.  Appropriate orders placed.  Clio L Shaul was informed that the remainder of the evaluation will be completed by another provider, this initial triage assessment does not replace that evaluation, and the importance of remaining in the ED until their evaluation is complete.  Nausea, vomiting, diarrhea, hyperglycemia   Mikaela Hilgeman A, PA-C 04/19/21 2011

## 2021-04-20 MED ORDER — POTASSIUM CHLORIDE CRYS ER 20 MEQ PO TBCR
40.0000 meq | EXTENDED_RELEASE_TABLET | Freq: Once | ORAL | Status: DC
  Filled 2021-04-20: qty 2

## 2021-04-20 MED ORDER — LACTATED RINGERS IV BOLUS
1000.0000 mL | Freq: Once | INTRAVENOUS | Status: AC
  Administered 2021-04-20: 1000 mL via INTRAVENOUS

## 2021-04-20 MED ORDER — KETOROLAC TROMETHAMINE 30 MG/ML IJ SOLN
30.0000 mg | Freq: Once | INTRAMUSCULAR | Status: AC
  Administered 2021-04-20: 30 mg via INTRAVENOUS
  Filled 2021-04-20: qty 1

## 2021-04-20 MED ORDER — LOPERAMIDE HCL 2 MG PO CAPS
4.0000 mg | ORAL_CAPSULE | Freq: Once | ORAL | Status: AC
  Administered 2021-04-20: 4 mg via ORAL
  Filled 2021-04-20: qty 2

## 2021-04-20 MED ORDER — ONDANSETRON HCL 4 MG/2ML IJ SOLN
4.0000 mg | Freq: Once | INTRAMUSCULAR | Status: AC
  Administered 2021-04-20: 4 mg via INTRAVENOUS
  Filled 2021-04-20: qty 2

## 2021-04-20 MED ORDER — ONDANSETRON HCL 4 MG PO TABS: 4.0000 mg | ORAL_TABLET | Freq: Four times a day (QID) | ORAL | 0 refills | Status: DC | PRN

## 2021-04-20 NOTE — ED Provider Notes (Signed)
[facility] DEPT Provider Note   CSN: [id number] Arrival date & time: 04/19/21  2004     History  Chief Complaint  Patient presents with   Generalized Body Aches    Melinda Stone is a 41 y.o. female.  The history is provided by the patient.  She has history of diabetes and comes in because nausea, vomiting, diarrhea for the last 3 days.  She has been having 4-5 episodes of emesis a day and 3-4 watery bowel movements a day.  There has been no blood or mucus in stool or emesis.  She denies fever but has had chills and sweats.  She is complaining of generalized body aches.  She did have a sick contact and that one of her children had a similar illness.  She took 1 dose of NyQuil at home, no other treatment at home.   Home Medications Prior to Admission medications   Medication Sig Start Date End Date Taking? Authorizing Provider  Blood Glucose Monitoring Suppl (ACCU-CHEK GUIDE ME) w/Device KIT Use as instructed to check blood sugar daily 07/13/20   Shamleffer, Melanie Crazier, MD  cyclobenzaprine (FLEXERIL) 10 MG tablet Take 10 mg by mouth 3 (three) times daily. 03/17/21   [provider]  Dulaglutide (TRULICITY) 1.5 LI/1.0VU SOPN Inject 1.5 mg into the skin once a week. 04/12/21   Shamleffer, Melanie Crazier, MD  gabapentin (NEURONTIN) 300 MG capsule Take 1 capsule (300 mg total) by mouth 2 (two) times daily. 12/23/20   Marybelle Killings, MD  gabapentin (NEURONTIN) 800 MG tablet Take 800 mg by mouth 3 (three) times daily. 04/04/21   [provider]  glipiZIDE (GLUCOTROL) 10 MG tablet Take 2 tablets (20 mg total) by mouth 2 (two) times daily before a meal. 04/12/21   Shamleffer, Melanie Crazier, MD  glucose blood (ACCU-CHEK GUIDE) test strip Use as instructed to check blood sugar 2 times daily 07/13/20   Shamleffer, Melanie Crazier, MD  oxyCODONE-acetaminophen (PERCOCET/ROXICET) 5-325 MG tablet Take 1 tablet by mouth 4 (four) times daily.  03/13/21   [provider]      Allergies    Patient has no known allergies.    Review of Systems   Review of Systems  All other systems reviewed and are negative.  Physical Exam Updated Vital Signs BP (!) 158/94    Pulse (!) 113    Temp 97.6 F (36.4 C) (Oral)    Resp 18    Ht 5' 8"  (1.727 m)    Wt 88.5 kg    SpO2 100%    BMI 29.65 kg/m  Physical Exam Vitals and nursing note reviewed.  41 year old female, resting comfortably and in no acute distress. Vital signs are significant for elevated blood pressure and heart rate. Oxygen saturation is 100%, which is normal. Head is normocephalic and atraumatic. PERRLA, EOMI. Oropharynx is clear. Neck is nontender and supple without adenopathy or JVD. Back is nontender and there is no CVA tenderness. Lungs are clear without rales, wheezes, or rhonchi. Chest is nontender. Heart has regular rate and rhythm without murmur. Abdomen is soft, flat, nontender.  Peristalsis is hypoactive. Extremities have no cyanosis or edema, full range of motion is present. Skin is warm and dry without rash. Neurologic: Mental status is normal, cranial nerves are intact, moves all extremities equally.  ED Results / Procedures / Treatments   Labs (all labs ordered are listed, but only abnormal results are displayed) Labs Reviewed  CBC WITH DIFFERENTIAL/PLATELET - Abnormal;  Notable for the following components:      Result Value   WBC 11.3 (*)    Hemoglobin 11.3 (*)    HCT 33.0 (*)    MCV 79.9 (*)    RDW 21.2 (*)    Neutro Abs 7.8 (*)    All other components within normal limits  COMPREHENSIVE METABOLIC PANEL - Abnormal; Notable for the following components:   Potassium 3.4 (*)    Glucose, Bld 343 (*)    Calcium 8.5 (*)    AST 14 (*)    Total Bilirubin 1.4 (*)    All other components within normal limits  URINALYSIS, ROUTINE W REFLEX MICROSCOPIC - Abnormal; Notable for the following components:   Glucose, UA >1,000 (*)    Protein, ur TRACE  (*)    Nitrite POSITIVE (*)    Bacteria, UA RARE (*)    All other components within normal limits  CBG MONITORING, ED - Abnormal; Notable for the following components:   Glucose-Capillary 275 (*)    All other components within normal limits  RESP PANEL BY RT-PCR (FLU A&B, COVID) ARPGX2  I-STAT BETA HCG BLOOD, ED ([facility], [facility], [facility] ONLY)   Procedures Procedures    Medications Ordered in ED Medications  lactated ringers bolus 1,000 mL (has no administration in time range)  ketorolac (TORADOL) 30 MG/ML injection 30 mg (has no administration in time range)  ondansetron (ZOFRAN) injection 4 mg (has no administration in time range)  loperamide (IMODIUM) capsule 4 mg (has no administration in time range)  potassium chloride SA (KLOR-CON M) CR tablet 40 mEq (has no administration in time range)    ED Course/ Medical Decision Making/ A&P                           Medical Decision Making Risk Prescription drug management.   Nausea, vomiting, diarrhea and pattern most suggestive of viral gastroenteritis.  Doubt bowel obstruction, food poisoning.  Respiratory pathogen panel was sent and is negative for influenza and COVID-19.  Labs show elevated glucose with normal CO2 and anion gap, no evidence of ketoacidosis.  Mild hypokalemia is present.  Total bilirubin is mildly elevated, but lower than it was on 04/17/2021.  Mild anemia is present.  Hemoglobin has dropped slightly compared with 04/17/2021, but is in a similar range to where she was at 11/04/2020.  Urinalysis does have a positive nitrite, but only rare bacteria and 6-10 WBCs per high-power field.  No symptoms suggestive of UTI, will not put on antibiotics.  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  She will be given IV fluids, ondansetron, ketorolac, and oral K-Dur and loperamide.  Following above-noted treatment, nausea was improved and she no  longer felt a sense of rectal urgency.  She is discharged with prescription for ondansetron, told to use over-the-counter loperamide as needed, use over-the-counter acetaminophen and ibuprofen as needed.  Return precautions discussed.        Final Clinical Impression(s) / ED Diagnoses Final diagnoses:  Nausea vomiting and diarrhea  Hypokalemia  Normochromic normocytic anemia    Rx / DC Orders ED Discharge Orders          Ordered    ondansetron (ZOFRAN) 4 MG tablet  Every 6 hours PRN        04/20/21 0786              Delora Fuel,  MD 04/20/21 5520

## 2021-04-20 NOTE — Discharge Instructions (Signed)
Take loperamide (Imodium A-D) as needed for diarrhea.  Take acetaminophen and/or ibuprofen as needed for fever or aching.  Please note that if you combine acetaminophen and ibuprofen you get better pain relief and fever relief then if you take either medication by itself.  Return to the emergency department if symptoms are getting worse, or if they are not being adequately controlled at home.

## 2021-05-02 ENCOUNTER — Ambulatory Visit: Payer: Medicaid Other | Admitting: Emergency Medicine

## 2021-05-16 ENCOUNTER — Other Ambulatory Visit (HOSPITAL_COMMUNITY): Payer: Self-pay

## 2021-05-16 ENCOUNTER — Other Ambulatory Visit: Payer: Self-pay | Admitting: Internal Medicine

## 2021-05-16 ENCOUNTER — Ambulatory Visit: Payer: Medicaid Other | Admitting: Orthopaedic Surgery

## 2021-05-17 ENCOUNTER — Other Ambulatory Visit (HOSPITAL_COMMUNITY): Payer: Self-pay

## 2021-05-17 ENCOUNTER — Telehealth: Payer: Self-pay

## 2021-05-17 NOTE — Telephone Encounter (Signed)
Patient Advocate Encounter ?  ?Received notification from [facility] that prior authorization for Trulicity 1.5mg /0.23ml pen injectors is required by his/her insurance [employer]. ?  ?PA submitted on 05/17/21 ? ?Key#: [id number] ? ?Status is pending ?   ?[facility] will continue to follow: ? ?Patient Advocate ?Fax: [phone]  ?

## 2021-05-17 NOTE — Telephone Encounter (Signed)
Patient Advocate Encounter ? ?Prior Authorization for Trulicity 1.5mg /0.3ml pen injectors has been approved.   ? ?PA# [id number] ? ?Effective dates: 05/17/21 through 05/18/22 ? ?Per Test Claim Patients co-pay is $4.  ? ?Spoke with Pharmacy to Process. ? ?Patient Advocate ?Fax: [phone]  ?

## 2021-06-03 ENCOUNTER — Emergency Department (HOSPITAL_COMMUNITY): Payer: Medicaid Other

## 2021-06-03 ENCOUNTER — Encounter (HOSPITAL_COMMUNITY): Payer: Self-pay

## 2021-06-03 ENCOUNTER — Emergency Department (HOSPITAL_COMMUNITY)
Admission: EM | Admit: 2021-06-03 | Discharge: 2021-06-03 | Disposition: A | Discharge status: Home / Self Care | Payer: Medicaid Other | Attending: Emergency Medicine | Admitting: Emergency Medicine

## 2021-06-03 DIAGNOSIS — N179 Acute kidney failure, unspecified: Secondary | ICD-10-CM | POA: Diagnosis not present

## 2021-06-03 DIAGNOSIS — R7989 Other specified abnormal findings of blood chemistry: Secondary | ICD-10-CM | POA: Insufficient documentation

## 2021-06-03 DIAGNOSIS — E119 Type 2 diabetes mellitus without complications: Secondary | ICD-10-CM | POA: Insufficient documentation

## 2021-06-03 DIAGNOSIS — D649 Anemia, unspecified: Secondary | ICD-10-CM | POA: Diagnosis not present

## 2021-06-03 DIAGNOSIS — R079 Chest pain, unspecified: Secondary | ICD-10-CM | POA: Insufficient documentation

## 2021-06-03 DIAGNOSIS — R791 Abnormal coagulation profile: Secondary | ICD-10-CM | POA: Diagnosis not present

## 2021-06-03 DIAGNOSIS — Z7985 Long-term (current) use of injectable non-insulin antidiabetic drugs: Secondary | ICD-10-CM | POA: Insufficient documentation

## 2021-06-03 DIAGNOSIS — Z20822 Contact with and (suspected) exposure to covid-19: Secondary | ICD-10-CM | POA: Diagnosis not present

## 2021-06-03 DIAGNOSIS — R42 Dizziness and giddiness: Secondary | ICD-10-CM | POA: Diagnosis present

## 2021-06-03 LAB — URINALYSIS, MICROSCOPIC (REFLEX): RBC / HPF: >50 RBC/hpf (ref 0–5)

## 2021-06-03 LAB — CBC
HCT: 28.5 % — ABNORMAL LOW (ref 36.0–46.0)
Hemoglobin: 9.6 g/dL — ABNORMAL LOW (ref 12.0–15.0)
MCH: 26.7 pg (ref 26.0–34.0)
MCHC: 33.7 g/dL (ref 30.0–36.0)
MCV: 79.2 fL — ABNORMAL LOW (ref 80.0–100.0)
Platelets: 200 10*3/uL (ref 150–400)
RBC: 3.6 MIL/uL — ABNORMAL LOW (ref 3.87–5.11)
RDW: 19.4 % — ABNORMAL HIGH (ref 11.5–15.5)
WBC: 8.4 10*3/uL (ref 4.0–10.5)
nRBC: 0 % (ref 0.0–0.2)

## 2021-06-03 LAB — BASIC METABOLIC PANEL
Anion gap: 9 (ref 5–15)
BUN: 25 mg/dL — ABNORMAL HIGH (ref 6–20)
CO2: 18 mmol/L — ABNORMAL LOW (ref 22–32)
Calcium: 8.9 mg/dL (ref 8.9–10.3)
Chloride: 108 mmol/L (ref 98–111)
Creatinine, Ser: 1.26 mg/dL — ABNORMAL HIGH (ref 0.44–1.00)
GFR, Estimated: 56 mL/min — ABNORMAL LOW (ref >60)
Glucose, Bld: 315 mg/dL — ABNORMAL HIGH (ref 70–99)
Potassium: 4 mmol/L (ref 3.5–5.1)
Sodium: 135 mmol/L (ref 135–145)

## 2021-06-03 LAB — I-STAT BETA HCG BLOOD, ED (MC, WL, AP ONLY): I-stat hCG, quantitative: <5 m[IU]/mL (ref <5)

## 2021-06-03 LAB — RESP PANEL BY RT-PCR (FLU A&B, COVID) ARPGX2
Influenza A by PCR: NEGATIVE
Influenza B by PCR: NEGATIVE
SARS Coronavirus 2 by RT PCR: NEGATIVE

## 2021-06-03 LAB — URINALYSIS, ROUTINE W REFLEX MICROSCOPIC
Bilirubin Urine: NEGATIVE
Glucose, UA: 100 mg/dL — AB
Ketones, ur: 15 mg/dL — AB
Nitrite: POSITIVE — AB
Protein, ur: >300 mg/dL — AB
Specific Gravity, Urine: 1.015 (ref 1.005–1.030)
pH: 6.5 (ref 5.0–8.0)

## 2021-06-03 LAB — D-DIMER, QUANTITATIVE: D-Dimer, Quant: 0.65 ug/mL-FEU — ABNORMAL HIGH (ref 0.00–0.50)

## 2021-06-03 LAB — CBG MONITORING, ED: Glucose-Capillary: 238 mg/dL — ABNORMAL HIGH (ref 70–99)

## 2021-06-03 MED ORDER — SODIUM CHLORIDE 0.9 % IV BOLUS
1000.0000 mL | Freq: Once | INTRAVENOUS | Status: AC
  Administered 2021-06-03: 1000 mL via INTRAVENOUS

## 2021-06-03 MED ORDER — IOHEXOL 350 MG/ML SOLN
75.0000 mL | Freq: Once | INTRAVENOUS | Status: AC | PRN
  Administered 2021-06-03: 75 mL via INTRAVENOUS

## 2021-06-03 NOTE — ED Triage Notes (Signed)
Pt states she had emesis x 1 this morning. Pt states that she is having sternal chest pain. Pt c/o dizziness x 3 days.  ?

## 2021-06-03 NOTE — Discharge Instructions (Addendum)
Please read the information about decreased kidney function attached to these papers.  It is important for you to follow-up about this with your primary care provider. ? ?You do not have a blood clot in your lungs.  There were a couple nodules in the upper part of your left lung.  There is a pulmonologist attached to these discharge papers for you to call for this reevaluation. ?

## 2021-06-03 NOTE — ED Provider Notes (Addendum)
?[facility] DEPT ?Provider Note ? ? ?CSN: [id number] ?Arrival date & time: 06/03/21  4782 ? ?  ? ?History ? ?Chief Complaint  ?Patient presents with  ? Dizziness  ? Emesis  ? Chest Pain  ? ? ?Melinda Stone is a 41 y.o. female with a past medical history of type 2 diabetes and chronic pain presenting today due to 3 days worth of lightheadedness, occasional dizziness and difficulty breathing at nighttime.  No history of ACS/PE/DVT.  No history of asthma.  No nausea, vomiting or diarrhea or known sick contacts.  Says that the dizzy feeling is more consistent with lightheadedness and this feeling it is worse when active.  Denies possibility of pregnancy. No urinary or vaginal sx. last menstrual period last week. ? ?Home Medications ?Prior to Admission medications   ?Medication Sig Start Date End Date Taking? Authorizing Provider  ?baclofen (LIORESAL) 10 MG tablet Take 10 mg by mouth 2 (two) times daily as needed for muscle spasms. 04/12/21   [provider]  ?Blood Glucose Monitoring Suppl (ACCU-CHEK GUIDE ME) w/Device KIT Use as instructed to check blood sugar daily 07/13/20   Shamleffer, Melanie Crazier, MD  ?Dulaglutide (TRULICITY) 1.5 NF/6.2ZH SOPN Inject 1.5 mg into the skin once a week. ?Patient taking differently: Inject 1.5 mg into the skin once a week. Monday 04/12/21   Shamleffer, Melanie Crazier, MD  ?gabapentin (NEURONTIN) 800 MG tablet Take 800 mg by mouth 3 (three) times daily. 04/04/21   [provider]  ?glipiZIDE (GLUCOTROL) 10 MG tablet Take 2 tablets (20 mg total) by mouth 2 (two) times daily before a meal. 04/12/21   Shamleffer, Melanie Crazier, MD  ?glucose blood (ACCU-CHEK GUIDE) test strip Use as instructed to check blood sugar 2 times daily 07/13/20   Shamleffer, Melanie Crazier, MD  ?ondansetron (ZOFRAN) 4 MG tablet Take 1 tablet (4 mg total) by mouth every 6 (six) hours as needed for nausea. 0/8/65   Delora Fuel, MD  ?oxyCODONE-acetaminophen  (PERCOCET/ROXICET) 5-325 MG tablet Take 1 tablet by mouth 4 (four) times daily. 03/13/21   [provider]  ?   ? ?Allergies    ?Patient has no known allergies.   ? ?Review of Systems   ?Review of Systems  ?Cardiovascular:  Positive for chest pain.  ?Gastrointestinal:  Positive for vomiting.  ?Neurological:  Positive for dizziness.  ? ?Physical Exam ?Updated Vital Signs ?BP 97/61   Pulse (!) 101   Temp 98.2 ?F (36.8 ?C) (Oral)   Resp 18   Ht 5' 8" (1.727 m)   Wt 88.5 kg   LMP 05/03/2021   SpO2 100%   BMI 29.65 kg/m?  ?Physical Exam ?Vitals and nursing note reviewed.  ?Constitutional:   ?   General: She is not in acute distress. ?   Appearance: Normal appearance. She is not ill-appearing.  ?HENT:  ?   Head: Normocephalic and atraumatic.  ?Eyes:  ?   General: No scleral icterus. ?   Conjunctiva/sclera: Conjunctivae normal.  ?Cardiovascular:  ?   Rate and Rhythm: Normal rate and regular rhythm.  ?   Heart sounds: Normal heart sounds.  ?Pulmonary:  ?   Effort: Pulmonary effort is normal. No respiratory distress.  ?   Breath sounds: Normal breath sounds.  ?Chest:  ?   Chest wall: No tenderness.  ?Abdominal:  ?   Tenderness: There is no abdominal tenderness.  ?Skin: ?   General: Skin is warm and dry.  ?   Findings: No rash.  ?  Neurological:  ?   Mental Status: She is alert.  ?Psychiatric:     ?   Mood and Affect: Mood normal.  ? ? ?ED Results / Procedures / Treatments   ?Labs ?(all labs ordered are listed, but only abnormal results are displayed) ?Labs Reviewed  ?BASIC METABOLIC PANEL - Abnormal; Notable for the following components:  ?    Result Value  ? CO2 18 (*)   ? Glucose, Bld 315 (*)   ? BUN 25 (*)   ? Creatinine, Ser 1.26 (*)   ? GFR, Estimated 56 (*)   ? All other components within normal limits  ?CBC - Abnormal; Notable for the following components:  ? RBC 3.60 (*)   ? Hemoglobin 9.6 (*)   ? HCT 28.5 (*)   ? MCV 79.2 (*)   ? RDW 19.4 (*)   ? All other components within normal limits  ?URINALYSIS,  ROUTINE W REFLEX MICROSCOPIC - Abnormal; Notable for the following components:  ? Hgb urine dipstick MODERATE (*)   ? Leukocytes,Ua LARGE (*)   ? Bacteria, UA RARE (*)   ? All other components within normal limits  ?D-DIMER, QUANTITATIVE - Abnormal; Notable for the following components:  ? D-Dimer, Quant 0.65 (*)   ? All other components within normal limits  ?CBG MONITORING, ED - Abnormal; Notable for the following components:  ? Glucose-Capillary 238 (*)   ? All other components within normal limits  ?RESP PANEL BY RT-PCR (FLU A&B, COVID) ARPGX2  ?I-STAT BETA HCG BLOOD, ED ([facility], [facility], [facility] ONLY)  ? ? ?EKG ?EKG Interpretation ? ?Date/Time:  Saturday June 03 2021 09:35:33 EDT ?Ventricular Rate:  103 ?PR Interval:  168 ?QRS Duration: 78 ?QT Interval:  348 ?QTC Calculation: 456 ?R Axis:   57 ?Text Interpretation: Sinus tachycardia Probable left atrial enlargement Low voltage, precordial leads Since last tracing rate faster Otherwise no significant change Confirmed by Daleen Bo [phone]) on 06/03/2021 12:47:54 PM ? ?Radiology ?DG Chest 1 View ? ?Result Date: 06/03/2021 ?CLINICAL DATA:  Shortness of breath.  Emesis. EXAM: CHEST  1 VIEW COMPARISON:  03/26/2021 FINDINGS: The heart size and mediastinal contours are within normal limits. Both lungs are clear. The visualized skeletal structures are unremarkable. IMPRESSION: No active disease. Electronically Signed   By: Kerby Moors M.D.   On: 06/03/2021 10:36  ? ?CT Angio Chest PE W and/or Wo Contrast ? ?Result Date: 06/03/2021 ?CLINICAL DATA:  Rule out acute pulmonary embolus. EXAM: CT ANGIOGRAPHY CHEST WITH CONTRAST TECHNIQUE: Multidetector CT imaging of the chest was performed using the standard protocol during bolus administration of intravenous contrast. Multiplanar CT image reconstructions and MIPs were obtained to evaluate the vascular anatomy. RADIATION DOSE REDUCTION: This exam was performed according to the departmental dose-optimization program which includes  automated exposure control, adjustment of the mA and/or kV according to patient size and/or use of iterative reconstruction technique. CONTRAST:  46m OMNIPAQUE IOHEXOL 350 MG/ML SOLN COMPARISON:  None FINDINGS: Cardiovascular: Satisfactory opacification of the pulmonary arteries to the segmental level. No evidence of pulmonary embolism. Normal heart size. No pericardial effusion. Mediastinum/Nodes: No enlarged mediastinal, hilar, or axillary lymph nodes. Thyroid gland, trachea, and esophagus demonstrate no significant findings. Lungs/Pleura: No pleural effusion, airspace consolidation, atelectasis, or pneumothorax. Clustered peripheral ground-glass nodules within the left upper lobe are likely inflammatory/infectious in etiology. The largest measures 5 mm, image 45/10. Upper Abdomen: No acute abnormality within the imaged portions of the upper abdomen. Previous cholecystectomy. Musculoskeletal: No chest wall abnormality. No acute or  significant osseous findings. Review of the MIP images confirms the above findings. IMPRESSION: 1. No evidence for acute pulmonary embolism. 2. Clustered peripheral ground-glass nodules within the left upper lobe are likely inflammatory/infectious in etiology. The largest measures 5 mm. Non-contrast chest CT at 3-6 months is recommended. If nodules persist and are stable at that time, consider additional non-contrast chest CT examinations at 2 and 4 years. This recommendation follows the consensus statement: Guidelines for Management of Incidental Pulmonary Nodules Detected on CT Images: From the Fleischner Society 2017; Radiology 2017; 284:228-243. Electronically Signed   By: Kerby Moors M.D.   On: 06/03/2021 12:43   ? ?Procedures ?Procedures  ?Normal sinus rhythm with a normal rate. ? ?Medications Ordered in ED ?Medications - No data to display ? ?ED Course/ Medical Decision Making/ A&P ?  ?                        ?Medical Decision Making ?Amount and/or Complexity of Data  Reviewed ?Labs: ordered. ?Radiology: ordered. ? ?Risk ?Prescription drug management. ? ? ?Patient presents to the ED for concern of lightheadedness, fatigue and dizziness. Differential includes but is not limited to viral i

## 2021-06-06 ENCOUNTER — Telehealth (HOSPITAL_COMMUNITY): Payer: Self-pay | Admitting: Student

## 2021-06-06 LAB — URINE CULTURE: Culture: >=100000 — AB

## 2021-06-06 NOTE — Telephone Encounter (Cosign Needed)
Patient's original urinalysis was negative for infection however I obtained a urine culture which is positive for E. coli.  With chart review, original urinalysis was canceled due to a problem with labeling and another one was performed which showed hematuria in the context of UTI.  I called and left a secure voicemail on patient's phone about the need for antibiotic treatment.  I called 2 weeks worth of Keflex to patient's pharmacy on file.  Return precautions previously discussed remain the same. ?

## 2021-06-07 ENCOUNTER — Telehealth: Payer: Self-pay | Admitting: Emergency Medicine

## 2021-06-07 NOTE — Telephone Encounter (Signed)
Post ED Visit - Positive Culture Follow-up: Successful Patient Follow-Up ? ?Culture assessed and recommendations reviewed by: ? ?[]  Elenor Quinones, Pharm.D. ?[]  Heide Guile, Pharm.D., BCPS AQ-ID ?[]  Parks Neptune, Pharm.D., BCPS ?[x]  Alycia Rossetti, Pharm.D., BCPS ?[]  [place], Pharm.D., BCPS, AAHIVP ?[]  Legrand Como, Pharm.D., BCPS, AAHIVP ?[]  Salome Arnt, PharmD, BCPS ?[]  Johnnette Gourd, PharmD, BCPS ?[]  Hughes Better, PharmD, BCPS ?[]  Leeroy Cha, PharmD ? ?Positive urine culture ? ?[]  Patient discharged without antimicrobial prescription and treatment is now indicated ?[]  Organism is resistant to prescribed ED discharge antimicrobial ?[]  Patient with positive blood cultures ? ?Changes discussed with ED provider: Dr Johnney Killian  ?New antibiotic prescription stiop keflex that was prescribed by PCP due to resistance, start Macrobid 100mg  po bid x 7 days ? ? ?Attempting to contact patient ? ? ?Hazle Nordmann ?06/07/2021, 9:33 AM ? ?  ?

## 2021-06-07 NOTE — Progress Notes (Signed)
ED Antimicrobial Stewardship Positive Culture Follow Up  ? ?Melinda Stone is an 41 y.o. female who presented to [facility] on 06/03/2021 with a chief complaint of  ?Chief Complaint  ?Patient presents with  ? Dizziness  ? Emesis  ? Chest Pain  ? ? ?Recent Results (from the past 720 hour(s))  ?Urine Culture     Status: Abnormal  ? Collection Time: 06/03/21  9:32 AM  ? Specimen: Urine, Clean Catch  ?Result Value Ref Range Status  ? Specimen Description   Final  ?  URINE, CLEAN CATCH ?Performed at [facility], [address], [place], [place] [address] ?  ? Special Requests   Final  ?  NONE ?Performed at [facility], [address], [place], [place] [address] ?  ? Culture >=100,000 COLONIES/mL ESCHERICHIA COLI (A)  Final  ? Report Status 06/06/2021 FINAL  Final  ? Organism ID, Bacteria ESCHERICHIA COLI (A)  Final  ?    Susceptibility  ? Escherichia coli - MIC*  ?  AMPICILLIN >=32 RESISTANT Resistant   ?  CEFAZOLIN >=64 RESISTANT Resistant   ?  CEFEPIME <=0.12 SENSITIVE Sensitive   ?  CEFTRIAXONE <=0.25 SENSITIVE Sensitive   ?  CIPROFLOXACIN <=0.25 SENSITIVE Sensitive   ?  GENTAMICIN <=1 SENSITIVE Sensitive   ?  IMIPENEM <=0.25 SENSITIVE Sensitive   ?  NITROFURANTOIN <=16 SENSITIVE Sensitive   ?  TRIMETH/SULFA <=20 SENSITIVE Sensitive   ?  AMPICILLIN/SULBACTAM >=32 RESISTANT Resistant   ?  PIP/TAZO >=128 RESISTANT Resistant   ?  * >=100,000 COLONIES/mL ESCHERICHIA COLI  ?Resp Panel by RT-PCR (Flu A&B, Covid) Nasopharyngeal Swab     Status: None  ? Collection Time: 06/03/21 10:08 AM  ? Specimen: Nasopharyngeal Swab; Nasopharyngeal(NP) swabs in vial transport medium  ?Result Value Ref Range Status  ? SARS Coronavirus 2 by RT PCR NEGATIVE NEGATIVE Final  ?  Comment: (NOTE) ?SARS-CoV-2 target nucleic acids are NOT DETECTED. ? ?The SARS-CoV-2 RNA is generally detectable in upper respiratory ?specimens during the acute phase of infection. The lowest ?concentration of SARS-CoV-2  viral copies this assay can detect is ?138 copies/mL. A negative result does not preclude SARS-Cov-2 ?infection and should not be used as the sole basis for treatment or ?other patient management decisions. A negative result may occur with  ?improper specimen collection/handling, submission of specimen other ?than nasopharyngeal swab, presence of viral mutation(s) within the ?areas targeted by this assay, and inadequate number of viral ?copies(<138 copies/mL). A negative result must be combined with ?clinical observations, patient history, and epidemiological ?information. The expected result is Negative. ? ?Fact Sheet for Patients:  ?BloggerCourse.com ? ?Fact Sheet for Healthcare Providers:  ?SeriousBroker.it ? ?This test is no t yet approved or cleared by the [place] FDA and  ?has been authorized for detection and/or diagnosis of SARS-CoV-2 by ?FDA under an Emergency Use Authorization (EUA). This EUA will remain  ?in effect (meaning this test can be used) for the duration of the ?COVID-19 declaration under Section 564(b)(1) of the Act, 21 ?U.S.C.section 360bbb-3(b)(1), unless the authorization is terminated  ?or revoked sooner.  ? ? ?  ? Influenza A by PCR NEGATIVE NEGATIVE Final  ? Influenza B by PCR NEGATIVE NEGATIVE Final  ?  Comment: (NOTE) ?The Xpert Xpress SARS-CoV-2/FLU/RSV plus assay is intended as an aid ?in the diagnosis of influenza from Nasopharyngeal swab specimens and ?should not be used as a sole basis for treatment. Nasal washings and ?aspirates are unacceptable for Xpert Xpress  SARS-CoV-2/FLU/RSV ?testing. ? ?Fact Sheet for Patients: ?BloggerCourse.com ? ?Fact Sheet for Healthcare Providers: ?SeriousBroker.it ? ?This test is not yet approved or cleared by the [place] FDA and ?has been authorized for detection and/or diagnosis of SARS-CoV-2 by ?FDA under an Emergency Use Authorization  (EUA). This EUA will remain ?in effect (meaning this test can be used) for the duration of the ?COVID-19 declaration under Section 564(b)(1) of the Act, 21 U.S.C. ?section 360bbb-3(b)(1), unless the authorization is terminated or ?revoked. ? ?Performed at [facility], [address] ?[place], [place] [address] ?  ? ? ?[x]  Treated with Keflex, organism resistant to prescribed antimicrobial ? ?New antibiotic prescription: Macrobid 100 mg po bid x 7d ? ?ED Provider: , MD ? ? ?Arby Barrette ?06/07/2021, 8:33 AM ?Clinical Pharmacist ?Monday - Friday phone -  [phone] ?Saturday - Sunday phone - [phone]  ?

## 2021-06-08 ENCOUNTER — Other Ambulatory Visit: Payer: Self-pay

## 2021-06-08 ENCOUNTER — Encounter (HOSPITAL_COMMUNITY): Payer: Self-pay

## 2021-06-08 ENCOUNTER — Emergency Department (HOSPITAL_COMMUNITY): Payer: Medicaid Other

## 2021-06-08 ENCOUNTER — Inpatient Hospital Stay (HOSPITAL_COMMUNITY)
Admission: EM | Admit: 2021-06-08 | Discharge: 2021-06-11 | DRG: 690 | Disposition: A | Discharge status: Home / Self Care | Payer: Medicaid Other | Attending: Internal Medicine | Admitting: Internal Medicine

## 2021-06-08 DIAGNOSIS — J441 Chronic obstructive pulmonary disease with (acute) exacerbation: Secondary | ICD-10-CM | POA: Diagnosis not present

## 2021-06-08 DIAGNOSIS — Z7985 Long-term (current) use of injectable non-insulin antidiabetic drugs: Secondary | ICD-10-CM

## 2021-06-08 DIAGNOSIS — N3000 Acute cystitis without hematuria: Secondary | ICD-10-CM | POA: Diagnosis not present

## 2021-06-08 DIAGNOSIS — R112 Nausea with vomiting, unspecified: Secondary | ICD-10-CM | POA: Diagnosis present

## 2021-06-08 DIAGNOSIS — Z8249 Family history of ischemic heart disease and other diseases of the circulatory system: Secondary | ICD-10-CM

## 2021-06-08 DIAGNOSIS — Z833 Family history of diabetes mellitus: Secondary | ICD-10-CM

## 2021-06-08 DIAGNOSIS — Z823 Family history of stroke: Secondary | ICD-10-CM

## 2021-06-08 DIAGNOSIS — E1140 Type 2 diabetes mellitus with diabetic neuropathy, unspecified: Secondary | ICD-10-CM | POA: Diagnosis present

## 2021-06-08 DIAGNOSIS — N136 Pyonephrosis: Principal | ICD-10-CM | POA: Diagnosis present

## 2021-06-08 DIAGNOSIS — Z79899 Other long term (current) drug therapy: Secondary | ICD-10-CM

## 2021-06-08 DIAGNOSIS — D649 Anemia, unspecified: Secondary | ICD-10-CM | POA: Diagnosis present

## 2021-06-08 DIAGNOSIS — E876 Hypokalemia: Secondary | ICD-10-CM | POA: Diagnosis not present

## 2021-06-08 DIAGNOSIS — Z7984 Long term (current) use of oral hypoglycemic drugs: Secondary | ICD-10-CM

## 2021-06-08 DIAGNOSIS — B9620 Unspecified Escherichia coli [E. coli] as the cause of diseases classified elsewhere: Secondary | ICD-10-CM | POA: Diagnosis present

## 2021-06-08 DIAGNOSIS — N390 Urinary tract infection, site not specified: Secondary | ICD-10-CM | POA: Diagnosis present

## 2021-06-08 DIAGNOSIS — F17290 Nicotine dependence, other tobacco product, uncomplicated: Secondary | ICD-10-CM | POA: Diagnosis present

## 2021-06-08 DIAGNOSIS — Z20822 Contact with and (suspected) exposure to covid-19: Secondary | ICD-10-CM | POA: Diagnosis present

## 2021-06-08 DIAGNOSIS — N10 Acute tubulo-interstitial nephritis: Principal | ICD-10-CM

## 2021-06-08 DIAGNOSIS — G8929 Other chronic pain: Secondary | ICD-10-CM | POA: Diagnosis present

## 2021-06-08 LAB — URINALYSIS, ROUTINE W REFLEX MICROSCOPIC
Bilirubin Urine: NEGATIVE
Glucose, UA: 50 mg/dL — AB
Ketones, ur: NEGATIVE mg/dL
Leukocytes,Ua: NEGATIVE
Nitrite: NEGATIVE
Protein, ur: NEGATIVE mg/dL
Specific Gravity, Urine: 1.027 (ref 1.005–1.030)
pH: 5 (ref 5.0–8.0)

## 2021-06-08 LAB — COMPREHENSIVE METABOLIC PANEL
ALT: 13 U/L (ref 0–44)
AST: 14 U/L — ABNORMAL LOW (ref 15–41)
Albumin: 3.4 g/dL — ABNORMAL LOW (ref 3.5–5.0)
Alkaline Phosphatase: 132 U/L — ABNORMAL HIGH (ref 38–126)
Anion gap: 5 (ref 5–15)
BUN: 11 mg/dL (ref 6–20)
CO2: 21 mmol/L — ABNORMAL LOW (ref 22–32)
Calcium: 8.6 mg/dL — ABNORMAL LOW (ref 8.9–10.3)
Chloride: 110 mmol/L (ref 98–111)
Creatinine, Ser: 0.55 mg/dL (ref 0.44–1.00)
GFR, Estimated: >60 mL/min (ref >60)
Glucose, Bld: 133 mg/dL — ABNORMAL HIGH (ref 70–99)
Potassium: 3.5 mmol/L (ref 3.5–5.1)
Sodium: 136 mmol/L (ref 135–145)
Total Bilirubin: 1.4 mg/dL — ABNORMAL HIGH (ref 0.3–1.2)
Total Protein: 6.9 g/dL (ref 6.5–8.1)

## 2021-06-08 LAB — CBC WITH DIFFERENTIAL/PLATELET
Abs Immature Granulocytes: 0.1 10*3/uL — ABNORMAL HIGH (ref 0.00–0.07)
Basophils Absolute: 0.1 10*3/uL (ref 0.0–0.1)
Basophils Relative: 1 %
Eosinophils Absolute: 0.4 10*3/uL (ref 0.0–0.5)
Eosinophils Relative: 3 %
HCT: 25.8 % — ABNORMAL LOW (ref 36.0–46.0)
Hemoglobin: 9 g/dL — ABNORMAL LOW (ref 12.0–15.0)
Immature Granulocytes: 1 %
Lymphocytes Relative: 23 %
Lymphs Abs: 3.2 10*3/uL (ref 0.7–4.0)
MCH: 26.6 pg (ref 26.0–34.0)
MCHC: 34.9 g/dL (ref 30.0–36.0)
MCV: 76.3 fL — ABNORMAL LOW (ref 80.0–100.0)
Monocytes Absolute: 0.7 10*3/uL (ref 0.1–1.0)
Monocytes Relative: 5 %
Neutro Abs: 9.4 10*3/uL — ABNORMAL HIGH (ref 1.7–7.7)
Neutrophils Relative %: 67 %
Platelets: 200 10*3/uL (ref 150–400)
RBC: 3.38 MIL/uL — ABNORMAL LOW (ref 3.87–5.11)
RDW: 19.1 % — ABNORMAL HIGH (ref 11.5–15.5)
WBC: 13.9 10*3/uL — ABNORMAL HIGH (ref 4.0–10.5)
nRBC: 0 % (ref 0.0–0.2)

## 2021-06-08 MED ORDER — ONDANSETRON HCL 4 MG PO TABS
4.0000 mg | ORAL_TABLET | Freq: Four times a day (QID) | ORAL | Status: DC | PRN
  Administered 2021-06-10: 4 mg via ORAL
  Filled 2021-06-08 (×2): qty 1

## 2021-06-08 MED ORDER — ACETAMINOPHEN 650 MG RE SUPP: 650.0000 mg | Freq: Four times a day (QID) | RECTAL | Status: DC | PRN

## 2021-06-08 MED ORDER — ONDANSETRON HCL 4 MG/2ML IJ SOLN
4.0000 mg | Freq: Once | INTRAMUSCULAR | Status: AC
  Administered 2021-06-08: 4 mg via INTRAVENOUS
  Filled 2021-06-08: qty 2

## 2021-06-08 MED ORDER — ONDANSETRON HCL 4 MG/2ML IJ SOLN
4.0000 mg | Freq: Four times a day (QID) | INTRAMUSCULAR | Status: DC | PRN
  Administered 2021-06-09 – 2021-06-11 (×6): 4 mg via INTRAVENOUS
  Filled 2021-06-08 (×6): qty 2

## 2021-06-08 MED ORDER — SODIUM CHLORIDE 0.9 % IV SOLN
1.0000 g | Freq: Once | INTRAVENOUS | Status: AC
  Administered 2021-06-08: 1 g via INTRAVENOUS
  Filled 2021-06-08: qty 10

## 2021-06-08 MED ORDER — ACETAMINOPHEN 325 MG PO TABS
650.0000 mg | ORAL_TABLET | Freq: Four times a day (QID) | ORAL | Status: DC | PRN
  Administered 2021-06-09 – 2021-06-10 (×4): 650 mg via ORAL
  Filled 2021-06-08 (×4): qty 2

## 2021-06-08 MED ORDER — SODIUM CHLORIDE 0.9 % IV BOLUS
1000.0000 mL | Freq: Once | INTRAVENOUS | Status: AC
  Administered 2021-06-08: 1000 mL via INTRAVENOUS

## 2021-06-08 MED ORDER — SODIUM CHLORIDE 0.9 % IV SOLN
2.0000 g | INTRAVENOUS | Status: DC
  Administered 2021-06-09 – 2021-06-11 (×3): 2 g via INTRAVENOUS
  Filled 2021-06-08 (×3): qty 20

## 2021-06-08 MED ORDER — LACTATED RINGERS IV SOLN: INTRAVENOUS | Status: AC

## 2021-06-08 NOTE — ED Provider Triage Note (Signed)
Emergency Medicine Provider Triage Evaluation Note ? ?Arshi Sena Slate , a 41 y.o. female  was evaluated in triage.  Pt complains of ongoing fatigue, nausea, vomiting since being seen a few days ago. New dizziness, blurry vision headache to bil temples. She reports it feels different than a normal headache, blurry vision has been present since yesterday. No weakness numbness, facial droop.  Patient had a urine culture performed at her visit from the 18th and was called back 2 days ago be informed that her urine culture was positive and to begin taking Keflex, she reports that she did not receive that call. She does deny dysuria at this time. ? ?Review of Systems  ?Positive: Fatigue, malaise, dizziness, headache ?Negative: Facial droop, dysuria, chest pain, shob, abdominal pain ? ?Physical Exam  ?BP 111/76 (BP Location: Left Arm)   Pulse (!) 104   Temp 98 ?F (36.7 ?C) (Oral)   Resp 19   SpO2 100%  ?Gen:   Awake, no distress , somewhat ill appearing ?Resp:  Normal effort  ?MSK:   Moves extremities without difficulty  ?Other:  No significant TTP abdomen, cn 2-12 grossly intact, moves all 4 limbs spontaneously ? ?Medical Decision Making  ?Medically screening exam initiated at 7:37 PM.  Appropriate orders placed.  Kylinn L Hornback was informed that the remainder of the evaluation will be completed by another provider, this initial triage assessment does not replace that evaluation, and the importance of remaining in the ED until their evaluation is complete. ? ?Workup initiated ?  ?Olene Floss, PA-C ?06/08/21 1940 ? ?

## 2021-06-08 NOTE — ED Triage Notes (Signed)
Pt presents to ED from home states she was seen Saturday for emesis and dehydration, sx have worsened. Endorses N/V dizziness.  ?

## 2021-06-08 NOTE — ED Provider Notes (Signed)
?[place] DEPT ?Provider Note ? ? ?CSN: [id number] ?Arrival date & time: 06/08/21  1921 ? ?  ? ?History ? ?Chief Complaint  ?Patient presents with  ? Dizziness  ? Emesis  ? ? ?Melinda Stone is a 41 y.o. female. ? ? ?Dizziness ?Associated symptoms: nausea and vomiting   ?Emesis ?Associated symptoms: cough   ?Patient was seen about 5 days ago.  At that time was feeling bad cough.  States has been coughing as November.  Has had nausea and vomiting.  Had D-dimer and CT scan of the chest that was overall reassuring although potentially had some nodules that could be infectious.  Urinalysis had shown possible infection but not initially treated.  Culture showed infection and had called for Keflex although patient did not get.  Later found to be resistant to the Keflex and call for [facility] yesterday which also patient did not get.  Is continued to feel bad.  No more nausea vomiting left flank pain.  Feeling dizzy.  Also headache.  Patient also states she just recently finished her menses.  States she has a history of anemia and tends to go down more when her period has just finished. ?  ? ?Home Medications ?Prior to Admission medications   ?Medication Sig Start Date End Date Taking? Authorizing Provider  ?baclofen (LIORESAL) 10 MG tablet Take 10 mg by mouth 2 (two) times daily as needed for muscle spasms. 04/12/21   [provider]  ?Blood Glucose Monitoring Suppl (ACCU-CHEK GUIDE ME) w/Device KIT Use as instructed to check blood sugar daily 07/13/20   Shamleffer, Melanie Crazier, MD  ?Dulaglutide (TRULICITY) 1.5 XN/2.3FT SOPN Inject 1.5 mg into the skin once a week. ?Patient taking differently: Inject 1.5 mg into the skin once a week. Monday 04/12/21   Shamleffer, Melanie Crazier, MD  ?gabapentin (NEURONTIN) 800 MG tablet Take 800 mg by mouth 3 (three) times daily. 04/04/21   [provider]  ?glipiZIDE (GLUCOTROL) 10 MG tablet Take 2 tablets (20 mg total) by mouth 2  (two) times daily before a meal. 04/12/21   Shamleffer, Melanie Crazier, MD  ?glucose blood (ACCU-CHEK GUIDE) test strip Use as instructed to check blood sugar 2 times daily 07/13/20   Shamleffer, Melanie Crazier, MD  ?ondansetron (ZOFRAN) 4 MG tablet Take 1 tablet (4 mg total) by mouth every 6 (six) hours as needed for nausea. 09/18/20   Delora Fuel, MD  ?oxyCODONE-acetaminophen (PERCOCET/ROXICET) 5-325 MG tablet Take 1 tablet by mouth 4 (four) times daily. 03/13/21   [provider]  ?   ? ?Allergies    ?Patient has no known allergies.   ? ?Review of Systems   ?Review of Systems  ?Constitutional:  Positive for appetite change.  ?HENT:  Negative for congestion.   ?Respiratory:  Positive for cough.   ?Gastrointestinal:  Positive for nausea and vomiting.  ?Genitourinary:  Positive for flank pain.  ?Musculoskeletal:  Negative for back pain.  ?Neurological:  Positive for dizziness.  ?Psychiatric/Behavioral:  Negative for confusion.   ? ?Physical Exam ?Updated Vital Signs ?BP 128/76   Pulse 99   Temp 98 ?F (36.7 ?C) (Oral)   Resp (!) 25   SpO2 100%  ?Physical Exam ?Vitals and nursing note reviewed.  ?HENT:  ?   Head: Normocephalic.  ?Eyes:  ?   Pupils: Pupils are equal, round, and reactive to light.  ?Pulmonary:  ?   Breath sounds: No wheezing.  ?Abdominal:  ?   Tenderness: There is no abdominal tenderness.  ?  Genitourinary: ?   Comments: CVA tenderness on left. ?Musculoskeletal:     ?   General: No tenderness.  ?Skin: ?   Capillary Refill: Capillary refill takes less than 2 seconds.  ?Neurological:  ?   Mental Status: She is alert and oriented to person, place, and time.  ? ? ?ED Results / Procedures / Treatments   ?Labs ?(all labs ordered are listed, but only abnormal results are displayed) ?Labs Reviewed  ?CBC WITH DIFFERENTIAL/PLATELET - Abnormal; Notable for the following components:  ?    Result Value  ? WBC 13.9 (*)   ? RBC 3.38 (*)   ? Hemoglobin 9.0 (*)   ? HCT 25.8 (*)   ? MCV 76.3 (*)   ? RDW 19.1  (*)   ? Neutro Abs 9.4 (*)   ? Abs Immature Granulocytes 0.10 (*)   ? All other components within normal limits  ?COMPREHENSIVE METABOLIC PANEL - Abnormal; Notable for the following components:  ? CO2 21 (*)   ? Glucose, Bld 133 (*)   ? Calcium 8.6 (*)   ? Albumin 3.4 (*)   ? AST 14 (*)   ? Alkaline Phosphatase 132 (*)   ? Total Bilirubin 1.4 (*)   ? All other components within normal limits  ?URINALYSIS, ROUTINE W REFLEX MICROSCOPIC - Abnormal; Notable for the following components:  ? APPearance HAZY (*)   ? Glucose, UA 50 (*)   ? Hgb urine dipstick MODERATE (*)   ? Bacteria, UA MANY (*)   ? All other components within normal limits  ?URINE CULTURE  ? ? ?EKG ?None ? ?Radiology ?CT Head Wo Contrast ? ?Result Date: 06/08/2021 ?CLINICAL DATA:  Headaches with nausea and vomiting for few days, initial encounter EXAM: CT HEAD WITHOUT CONTRAST TECHNIQUE: Contiguous axial images were obtained from the base of the skull through the vertex without intravenous contrast. RADIATION DOSE REDUCTION: This exam was performed according to the departmental dose-optimization program which includes automated exposure control, adjustment of the mA and/or kV according to patient size and/or use of iterative reconstruction technique. COMPARISON:  04/17/2021 FINDINGS: Brain: No evidence of acute infarction, hemorrhage, hydrocephalus, extra-axial collection or mass lesion/mass effect. Vascular: No hyperdense vessel or unexpected calcification. Skull: Normal. Negative for fracture or focal lesion. Sinuses/Orbits: No acute finding. Other: None. IMPRESSION: No acute intracranial abnormality noted. Electronically Signed   By: Inez Catalina M.D.   On: 06/08/2021 19:50   ? ?Procedures ?Procedures  ? ? ?Medications Ordered in ED ?Medications - No data to display ? ?ED Course/ Medical Decision Making/ A&P ?  ?                        ?Medical Decision Making ? ?Patient presents with dizziness nausea and vomiting.  Also headache.  Blurred vision and  feeling bad.  Urinalysis shows infection.  Also reviewed culture which showed resistant to the Keflex.  White count is elevated.  Hemoglobin is gone down.  States she had just finished her menses.  Has gone down around 4 g over the last 2 months gone down under a gram over the last week.  With nausea and vomiting and UTI I feel as if she would benefit from mission to the hospital.  With the flank pain potentially a pyelonephritis.  Will discuss with hospitalist for admission.  Reviewed previous notes.  Reviewed culture of the urine. ? ? ? ? ? ? ?Final Clinical Impression(s) / ED Diagnoses ?Final diagnoses:  ?Acute  pyelonephritis  ?Anemia, unspecified type  ? ? ?Rx / DC Orders ?ED Discharge Orders   ? ? None  ? ?  ? ? ?  ?Davonna Belling, MD ?06/08/21 2222 ? ?

## 2021-06-08 NOTE — H&P (Signed)
?History and Physical  ? ? ?Melinda Stone MRN:[id number] DOB: 02/05/1981 DOA: 06/08/2021 ? ?PCP: Hatchett, Mary, NP  ?Patient coming from: Home. ? ?Chief Complaint: Nausea vomiting and dizziness. ? ?HPI: Melinda Stone is a 41 y.o. female with history of diabetes mellitus type 2, chronic pain and neuropathy presents to the ER for the second time with complaints of nausea vomiting dizziness and weakness.  Patient had come on June 03, 2021 when patient had work-up which included CT angiogram of the chest which showed lung nodules and was instructed to follow-up with primary care physician.  During that visit patient also had a UA and urine cultures which eventually grew E. coli.  Patient had medication called in but was not filled.  Patient presents back with persistent nausea vomiting dizziness and also diarrhea.  Also has been having left flank pain. ? ?ED Course: In the ER patient had CT head which did not show anything acute.  Labs showed leukocytosis hemoglobin of 9 which is stable when compared to recent past.  Patient has been having heavy menstrual cycles last cycle was 2 days ago.  EKG shows sinus tachycardia.  Since patient's urine cultures which grew E. coli was sensitive to ceftriaxone patient was placed on antibiotics hydration and admitted for further observation.  Patient on exam has benign persistent productive cough.  COVID test is pending. ? ?Review of Systems: As per HPI, rest all negative. ? ? ?Past Medical History:  ?Diagnosis Date  ? Anemia   ? Arthritis 08/17/2020  ? Chronic back pain   ? Diabetes mellitus without complication (HCC)   ? Ovarian cyst   ? Pneumonia   ? Polyneuropathy   ? ? ?Past Surgical History:  ?Procedure Laterality Date  ? CESAREAN SECTION    ? CHOLECYSTECTOMY  2004  ? LUMBAR LAMINECTOMY N/A 05/30/2020  ? Procedure: Lumbar five Laminectomy,  Bilateral Microdiscectomy, Left Lumbar five -Sacral one Microdiscectomy;  Surgeon: Yates, Mark C, MD;  Location: [facility] OR;   Service: Orthopedics;  Laterality: N/A;  ? LUMBAR LAMINECTOMY Left 11/14/2020  ? Procedure: LEFT LUMBAR FOUR-FIVE MICRODISCECTOMY;  Surgeon: Yates, Mark C, MD;  Location: [facility] OR;  Service: Orthopedics;  Laterality: Left;  ? TUBAL LIGATION  10/19/2010  ? ? ? reports that she has been smoking cigars. She has never used smokeless tobacco. She reports that she does not currently use alcohol. She reports that she does not currently use drugs after having used the following drugs: Marijuana. ? ?No Known Allergies ? ?Family History  ?Problem Relation Age of Onset  ? Diabetes Mother   ? Hypertension Mother   ? Stroke Mother   ? ? ?Prior to Admission medications   ?Medication Sig Start Date End Date Taking? Authorizing Provider  ?chlorhexidine (PERIDEX) 0.12 % solution Use as directed 5 mLs in the mouth or throat 2 (two) times daily. 06/02/21  Yes [provider]  ?Dulaglutide (TRULICITY) 1.5 MG/0.5ML SOPN Inject 1.5 mg into the skin once a week. 04/12/21  Yes Shamleffer, Ibtehal Jaralla, MD  ?gabapentin (NEURONTIN) 800 MG tablet Take 800 mg by mouth in the morning, at noon, in the evening, and at bedtime. 04/04/21  Yes [provider]  ?glipiZIDE (GLUCOTROL) 10 MG tablet Take 2 tablets (20 mg total) by mouth 2 (two) times daily before a meal. 04/12/21  Yes Shamleffer, Ibtehal Jaralla, MD  ?oxyCODONE-acetaminophen (PERCOCET/ROXICET) 5-325 MG tablet Take 1 tablet by mouth every 6 (six) hours as needed for moderate pain or severe pain. 03/13/21  Yes   [provider]  ?penicillin v potassium (VEETID) 500 MG tablet Take 500 mg by mouth 4 (four) times daily. 06/02/21  Yes [provider]  ?tiZANidine (ZANAFLEX) 4 MG tablet Take 4 mg by mouth in the morning, at noon, in the evening, and at bedtime. 05/18/21  Yes [provider]  ?VENTOLIN HFA 108 (90 Base) MCG/ACT inhaler 2 puffs every 6 (six) hours as needed for wheezing or shortness of breath. 06/04/21  Yes [provider]  ?Blood  Glucose Monitoring Suppl (ACCU-CHEK GUIDE ME) w/Device KIT Use as instructed to check blood sugar daily 07/13/20   Shamleffer, Ibtehal Jaralla, MD  ?cephALEXin (KEFLEX) 500 MG capsule Take 500 mg by mouth 4 (four) times daily. 06/06/21   [provider]  ?glucose blood (ACCU-CHEK GUIDE) test strip Use as instructed to check blood sugar 2 times daily 07/13/20   Shamleffer, Ibtehal Jaralla, MD  ?ondansetron (ZOFRAN) 4 MG tablet Take 1 tablet (4 mg total) by mouth every 6 (six) hours as needed for nausea. ?Patient not taking: Reported on 06/08/2021 04/20/21   Glick, David, MD  ? ? ?Physical Exam: ?Constitutional: Moderately built and nourished. ?Vitals:  ? 06/08/21 1928 06/08/21 2030 06/08/21 2130  ?BP: 111/76 101/73 128/76  ?Pulse: (!) 104 (!) 103 99  ?Resp: 19 (!) 24 (!) 25  ?Temp: 98 ?F (36.7 ?C)    ?TempSrc: Oral    ?SpO2: 100% 100% 100%  ? ?Eyes: Anicteric no pallor. ?ENMT: No discharge from the ears eyes nose and mouth. ?Neck: No mass felt.  No neck rigidity. ?Respiratory: No rhonchi or crepitations. ?Cardiovascular: S1-S2 heard. ?Abdomen: Soft nontender bowel sound present. ?Musculoskeletal: No edema. ?Skin: No rash. ?Neurologic: Alert awake oriented to time place and person.  Moves all extremities. ?Psychiatric: Appears normal.  Normal affect. ? ? ?Labs on Admission: I have personally reviewed following labs and imaging studies ? ?CBC: ?Recent Labs  ?Lab 06/03/21 ?0932 06/08/21 ?2049  ?WBC 8.4 13.9*  ?NEUTROABS  --  9.4*  ?HGB 9.6* 9.0*  ?HCT 28.5* 25.8*  ?MCV 79.2* 76.3*  ?PLT 200 200  ? ?Basic Metabolic Panel: ?Recent Labs  ?Lab 06/03/21 ?0932 06/08/21 ?2049  ?NA 135 136  ?K 4.0 3.5  ?CL 108 110  ?CO2 18* 21*  ?GLUCOSE 315* 133*  ?BUN 25* 11  ?CREATININE 1.26* 0.55  ?CALCIUM 8.9 8.6*  ? ?GFR: ?Estimated Creatinine Clearance: 109.8 mL/min (by C-G formula based on SCr of 0.55 mg/dL). ?Liver Function Tests: ?Recent Labs  ?Lab 06/08/21 ?2049  ?AST 14*  ?ALT 13  ?ALKPHOS 132*  ?BILITOT 1.4*  ?PROT 6.9  ?ALBUMIN  3.4*  ? ?No results for input(s): LIPASE, AMYLASE in the last 168 hours. ?No results for input(s): AMMONIA in the last 168 hours. ?Coagulation Profile: ?No results for input(s): INR, PROTIME in the last 168 hours. ?Cardiac Enzymes: ?No results for input(s): CKTOTAL, CKMB, CKMBINDEX, TROPONINI in the last 168 hours. ?BNP (last 3 results) ?No results for input(s): PROBNP in the last 8760 hours. ?HbA1C: ?No results for input(s): HGBA1C in the last 72 hours. ?CBG: ?Recent Labs  ?Lab 06/03/21 ?1009  ?GLUCAP 238*  ? ?Lipid Profile: ?No results for input(s): CHOL, HDL, LDLCALC, TRIG, CHOLHDL, LDLDIRECT in the last 72 hours. ?Thyroid Function Tests: ?No results for input(s): TSH, T4TOTAL, FREET4, T3FREE, THYROIDAB in the last 72 hours. ?Anemia Panel: ?No results for input(s): VITAMINB12, FOLATE, FERRITIN, TIBC, IRON, RETICCTPCT in the last 72 hours. ?Urine analysis: ?   ?Component Value Date/Time  ? COLORURINE YELLOW 06/08/2021 2049  ?   APPEARANCEUR HAZY (A) 06/08/2021 2049  ? LABSPEC 1.027 06/08/2021 2049  ? PHURINE 5.0 06/08/2021 2049  ? GLUCOSEU 50 (A) 06/08/2021 2049  ? HGBUR MODERATE (A) 06/08/2021 2049  ? BILIRUBINUR NEGATIVE 06/08/2021 2049  ? BILIRUBINUR negative 03/01/2020 1036  ? KETONESUR NEGATIVE 06/08/2021 2049  ? PROTEINUR NEGATIVE 06/08/2021 2049  ? UROBILINOGEN 0.2 03/01/2020 1036  ? NITRITE NEGATIVE 06/08/2021 2049  ? LEUKOCYTESUR NEGATIVE 06/08/2021 2049  ? ?Sepsis Labs: ?@LABRCNTIP(procalcitonin:4,lacticidven:4) ?) ?Recent Results (from the past 240 hour(s))  ?Urine Culture     Status: Abnormal  ? Collection Time: 06/03/21  9:32 AM  ? Specimen: Urine, Clean Catch  ?Result Value Ref Range Status  ? Specimen Description   Final  ?  URINE, CLEAN CATCH ?Performed at [facility], [address] [place], [place] [address] ?  ? Special Requests   Final  ?  NONE ?Performed at [facility], [address] [place], [place] [address] ?  ? Culture >=100,000 COLONIES/mL  ESCHERICHIA COLI (A)  Final  ? Report Status 06/06/2021 FINAL  Final  ? Organism ID, Bacteria ESCHERICHIA COLI (A)  Final  ?    Susceptibility  ? Escherichia coli - MIC*  ?  AMPICILLIN >=32 RESISTANT Resistant   ?

## 2021-06-09 ENCOUNTER — Observation Stay (HOSPITAL_COMMUNITY): Payer: Medicaid Other

## 2021-06-09 ENCOUNTER — Inpatient Hospital Stay (HOSPITAL_COMMUNITY): Payer: Medicaid Other

## 2021-06-09 DIAGNOSIS — D649 Anemia, unspecified: Secondary | ICD-10-CM | POA: Diagnosis present

## 2021-06-09 DIAGNOSIS — Z7984 Long term (current) use of oral hypoglycemic drugs: Secondary | ICD-10-CM | POA: Diagnosis not present

## 2021-06-09 DIAGNOSIS — B9620 Unspecified Escherichia coli [E. coli] as the cause of diseases classified elsewhere: Secondary | ICD-10-CM | POA: Diagnosis present

## 2021-06-09 DIAGNOSIS — Z79899 Other long term (current) drug therapy: Secondary | ICD-10-CM | POA: Diagnosis not present

## 2021-06-09 DIAGNOSIS — Z20822 Contact with and (suspected) exposure to covid-19: Secondary | ICD-10-CM | POA: Diagnosis present

## 2021-06-09 DIAGNOSIS — Z823 Family history of stroke: Secondary | ICD-10-CM | POA: Diagnosis not present

## 2021-06-09 DIAGNOSIS — J441 Chronic obstructive pulmonary disease with (acute) exacerbation: Secondary | ICD-10-CM | POA: Diagnosis not present

## 2021-06-09 DIAGNOSIS — R112 Nausea with vomiting, unspecified: Secondary | ICD-10-CM | POA: Diagnosis not present

## 2021-06-09 DIAGNOSIS — E876 Hypokalemia: Secondary | ICD-10-CM | POA: Diagnosis not present

## 2021-06-09 DIAGNOSIS — N136 Pyonephrosis: Secondary | ICD-10-CM | POA: Diagnosis not present

## 2021-06-09 DIAGNOSIS — Z7985 Long-term (current) use of injectable non-insulin antidiabetic drugs: Secondary | ICD-10-CM | POA: Diagnosis not present

## 2021-06-09 DIAGNOSIS — F17290 Nicotine dependence, other tobacco product, uncomplicated: Secondary | ICD-10-CM | POA: Diagnosis present

## 2021-06-09 DIAGNOSIS — G8929 Other chronic pain: Secondary | ICD-10-CM | POA: Diagnosis present

## 2021-06-09 DIAGNOSIS — E1140 Type 2 diabetes mellitus with diabetic neuropathy, unspecified: Secondary | ICD-10-CM | POA: Diagnosis present

## 2021-06-09 DIAGNOSIS — Z8249 Family history of ischemic heart disease and other diseases of the circulatory system: Secondary | ICD-10-CM | POA: Diagnosis not present

## 2021-06-09 DIAGNOSIS — Z833 Family history of diabetes mellitus: Secondary | ICD-10-CM | POA: Diagnosis not present

## 2021-06-09 LAB — CBC
HCT: 22.1 % — ABNORMAL LOW (ref 36.0–46.0)
Hemoglobin: 7.7 g/dL — ABNORMAL LOW (ref 12.0–15.0)
MCH: 27.2 pg (ref 26.0–34.0)
MCHC: 34.8 g/dL (ref 30.0–36.0)
MCV: 78.1 fL — ABNORMAL LOW (ref 80.0–100.0)
Platelets: 157 10*3/uL (ref 150–400)
RBC: 2.83 MIL/uL — ABNORMAL LOW (ref 3.87–5.11)
RDW: 19.4 % — ABNORMAL HIGH (ref 11.5–15.5)
WBC: 10.1 10*3/uL (ref 4.0–10.5)
nRBC: 0 % (ref 0.0–0.2)

## 2021-06-09 LAB — COMPREHENSIVE METABOLIC PANEL
ALT: 11 U/L (ref 0–44)
AST: 12 U/L — ABNORMAL LOW (ref 15–41)
Albumin: 2.9 g/dL — ABNORMAL LOW (ref 3.5–5.0)
Alkaline Phosphatase: 108 U/L (ref 38–126)
Anion gap: 4 — ABNORMAL LOW (ref 5–15)
BUN: 8 mg/dL (ref 6–20)
CO2: 20 mmol/L — ABNORMAL LOW (ref 22–32)
Calcium: 8.1 mg/dL — ABNORMAL LOW (ref 8.9–10.3)
Chloride: 114 mmol/L — ABNORMAL HIGH (ref 98–111)
Creatinine, Ser: 0.53 mg/dL (ref 0.44–1.00)
GFR, Estimated: >60 mL/min (ref >60)
Glucose, Bld: 107 mg/dL — ABNORMAL HIGH (ref 70–99)
Potassium: 3.3 mmol/L — ABNORMAL LOW (ref 3.5–5.1)
Sodium: 138 mmol/L (ref 135–145)
Total Bilirubin: 1.3 mg/dL — ABNORMAL HIGH (ref 0.3–1.2)
Total Protein: 5.8 g/dL — ABNORMAL LOW (ref 6.5–8.1)

## 2021-06-09 LAB — RESP PANEL BY RT-PCR (FLU A&B, COVID) ARPGX2
Influenza A by PCR: NEGATIVE
Influenza B by PCR: NEGATIVE
SARS Coronavirus 2 by RT PCR: NEGATIVE

## 2021-06-09 LAB — GLUCOSE, CAPILLARY
Glucose-Capillary: 108 mg/dL — ABNORMAL HIGH (ref 70–99)
Glucose-Capillary: 117 mg/dL — ABNORMAL HIGH (ref 70–99)
Glucose-Capillary: 147 mg/dL — ABNORMAL HIGH (ref 70–99)
Glucose-Capillary: 153 mg/dL — ABNORMAL HIGH (ref 70–99)
Glucose-Capillary: 195 mg/dL — ABNORMAL HIGH (ref 70–99)

## 2021-06-09 LAB — PREGNANCY, URINE: Preg Test, Ur: NEGATIVE

## 2021-06-09 LAB — MAGNESIUM: Magnesium: 1.7 mg/dL (ref 1.7–2.4)

## 2021-06-09 MED ORDER — OXYCODONE-ACETAMINOPHEN 5-325 MG PO TABS
1.0000 | ORAL_TABLET | Freq: Four times a day (QID) | ORAL | Status: DC | PRN
  Administered 2021-06-10 – 2021-06-11 (×3): 1 via ORAL
  Filled 2021-06-09 (×3): qty 1

## 2021-06-09 MED ORDER — GABAPENTIN 400 MG PO CAPS
800.0000 mg | ORAL_CAPSULE | Freq: Three times a day (TID) | ORAL | Status: DC
  Administered 2021-06-09 – 2021-06-11 (×8): 800 mg via ORAL
  Filled 2021-06-09 (×8): qty 2

## 2021-06-09 MED ORDER — SODIUM CHLORIDE 0.9 % IV SOLN
500.0000 mg | INTRAVENOUS | Status: DC
  Administered 2021-06-09: 500 mg via INTRAVENOUS
  Filled 2021-06-09: qty 5

## 2021-06-09 MED ORDER — POTASSIUM CHLORIDE 10 MEQ/100ML IV SOLN
10.0000 meq | INTRAVENOUS | Status: AC
  Administered 2021-06-09 (×4): 10 meq via INTRAVENOUS
  Filled 2021-06-09 (×4): qty 100

## 2021-06-09 MED ORDER — TIZANIDINE HCL 4 MG PO TABS
4.0000 mg | ORAL_TABLET | Freq: Three times a day (TID) | ORAL | Status: DC | PRN
  Administered 2021-06-09 – 2021-06-10 (×3): 4 mg via ORAL
  Filled 2021-06-09 (×3): qty 1

## 2021-06-09 MED ORDER — SODIUM CHLORIDE 0.9 % IV SOLN
INTRAVENOUS | Status: DC
Start: 2021-06-09 — End: 2021-06-11

## 2021-06-09 MED ORDER — INSULIN ASPART 100 UNIT/ML IJ SOLN
0.0000 [IU] | Freq: Three times a day (TID) | INTRAMUSCULAR | Status: DC
  Administered 2021-06-09 (×2): 2 [IU] via SUBCUTANEOUS
  Administered 2021-06-10: 1 [IU] via SUBCUTANEOUS
  Administered 2021-06-10: 2 [IU] via SUBCUTANEOUS
  Administered 2021-06-10: 5 [IU] via SUBCUTANEOUS
  Administered 2021-06-11: 3 [IU] via SUBCUTANEOUS
  Administered 2021-06-11: 1 [IU] via SUBCUTANEOUS

## 2021-06-09 MED ORDER — KETOROLAC TROMETHAMINE 15 MG/ML IJ SOLN
15.0000 mg | Freq: Once | INTRAMUSCULAR | Status: AC
  Administered 2021-06-09: 15 mg via INTRAVENOUS
  Filled 2021-06-09: qty 1

## 2021-06-09 MED ORDER — IOHEXOL 9 MG/ML PO SOLN
ORAL | Status: AC
  Administered 2021-06-09: 500 mL
  Filled 2021-06-09: qty 1000

## 2021-06-09 NOTE — Progress Notes (Signed)
MD Toniann Fail was secured chatted because patient is c/o abdominal pain, 10/10. PRN Tylenol was given at 0023, no other PRN available to be given. ?

## 2021-06-09 NOTE — Progress Notes (Signed)
?PROGRESS NOTE ? ? ? ?Melinda Stone  [id number] DOB: April 14, 1980 DOA: 06/08/2021 ?PCP: Lauretta Grill, NP  ? ?Brief Narrative: 41 year old female with history of type 2 diabetes, tobacco abuse, neuropathy, chronic pain admitted with nausea vomiting diarrhea and dizziness.  Patient came to the ER 6 days prior to admission with similar complaints.  Her urine culture at that time came back positive for E. coli nitrofurantoin was called in however patient has not picked it up.  She continued with ongoing dizziness nausea vomiting diarrhea and left flank pain.  She is admitted with diagnosis of pyelonephritis E. coli urinary tract infection on IV Rocephin. ? ?Assessment & Plan: ?  ?Principal Problem: ?  Nausea & vomiting ?Active Problems: ?  UTI (urinary tract infection) ? ? ?#1 E. coli urinary tract infection with bilateral flank pain renal ultrasound with right mild hydronephrosis. ?CT abdomen pelvis without contrast to rule out renal stones ?E. coli sensitive to Rocephin continue. ?Patient with 10 out of 10 pain to the flanks. ?Discussed with urology. ?At the time of admission patient was tachycardic with leukocytosis.  Leukocytosis have resolved she is still intermittently tachycardic. ? ?#2 hypokalemia replete and recheck in a.m. check mag level. ? ?#3 type 2 diabetes continue SSI. ? ?#4 chronic anemia patient reports her hemoglobin drops meant during her menstrual cycle.  Will monitor closely. ? ?#5 nausea vomiting and diarrhea continue IV fluids  ? ?#6 dizziness orthostatics pending continue IV fluids ? ?#7 lung nodules by CT chest in the setting of tobacco use patient with groundglass nodules in the left upper lobe.  COVID-negative.  Continue Rocephin and Zithromax.  Patient has productive cough and shortness of breath. ? ? ? ?Estimated body mass index is 28.5 kg/m? as calculated from the following: ?  Height as of this encounter: 5\' 9"  (1.753 m). ?  Weight as of this encounter: 87.5 kg. ? ?DVT  prophylaxis: NONE ?Code Status: FULL ?Family Communication: NONE ?Disposition Plan:  Status is: Inpatient ?Remains inpatient appropriate because: IV ROCEPHIN ?  ?Consultants:  ?NONE ? ?Procedures: NONE ?Antimicrobials:ROCEPHIN AZITHROMYCIN ? ?Subjective: ?She is resting in bed continues to complain of nausea diarrhea is better] ?Complaining of bilateral flank pain ? ?Objective: ?Vitals:  ? 06/08/21 2130 06/08/21 2358 06/09/21 0000 06/09/21 0432  ?BP: 128/76 134/80  122/75  ?Pulse: 99 99  (!) 104  ?Resp: (!) 25 20  18   ?Temp:  98.6 ?F (37 ?C)  98.8 ?F (37.1 ?C)  ?TempSrc:  Oral  Oral  ?SpO2: 100% 100%  100%  ?Weight:   87.5 kg   ?Height:   5\' 9"  (1.753 m)   ? ? ?Intake/Output Summary (Last 24 hours) at 06/09/2021 1213 ?Last data filed at 06/09/2021 0600 ?Gross per 24 hour  ?Intake 2921.67 ml  ?Output 300 ml  ?Net 2621.67 ml  ? ?Filed Weights  ? 06/09/21 0000  ?Weight: 87.5 kg  ? ? ?Examination: ? ?General exam: Appears calm and comfortable  ?Respiratory system: Clear to auscultation. Respiratory effort normal. ?Cardiovascular system: S1 & S2 heard, RRR. No JVD, murmurs, rubs, gallops or clicks. No pedal edema. ?Gastrointestinal system: Abdomen is nondistended, soft and nontender. No organomegaly or masses felt. Normal bowel sounds heard.  Bilateral CVA tenderness right more than left ?Central nervous system: Alert and oriented. No focal neurological deficits. ?Extremities: Symmetric 5 x 5 power. ?Skin: No rashes, lesions or ulcers ?Psychiatry: Judgement and insight appear normal. Mood & affect appropriate.  ? ? ? ?Data Reviewed: I have personally reviewed following labs  and imaging studies ? ?CBC: ?Recent Labs  ?Lab 06/03/21 ?0932 06/08/21 ?2049 06/09/21 ?0353  ?WBC 8.4 13.9* 10.1  ?NEUTROABS  --  9.4*  --   ?HGB 9.6* 9.0* 7.7*  ?HCT 28.5* 25.8* 22.1*  ?MCV 79.2* 76.3* 78.1*  ?PLT 200 200 157  ? ?Basic Metabolic Panel: ?Recent Labs  ?Lab 06/03/21 ?0932 06/08/21 ?2049 06/09/21 ?0353  ?NA 135 136 138  ?K 4.0 3.5 3.3*   ?CL 108 110 114*  ?CO2 18* 21* 20*  ?GLUCOSE 315* 133* 107*  ?BUN 25* 11 8  ?CREATININE 1.26* 0.55 0.53  ?CALCIUM 8.9 8.6* 8.1*  ? ?GFR: ?Estimated Creatinine Clearance: 111.3 mL/min (by C-G formula based on SCr of 0.53 mg/dL). ?Liver Function Tests: ?Recent Labs  ?Lab 06/08/21 ?2049 06/09/21 ?0353  ?AST 14* 12*  ?ALT 13 11  ?ALKPHOS 132* 108  ?BILITOT 1.4* 1.3*  ?PROT 6.9 5.8*  ?ALBUMIN 3.4* 2.9*  ? ?No results for input(s): LIPASE, AMYLASE in the last 168 hours. ?No results for input(s): AMMONIA in the last 168 hours. ?Coagulation Profile: ?No results for input(s): INR, PROTIME in the last 168 hours. ?Cardiac Enzymes: ?No results for input(s): CKTOTAL, CKMB, CKMBINDEX, TROPONINI in the last 168 hours. ?BNP (last 3 results) ?No results for input(s): PROBNP in the last 8760 hours. ?HbA1C: ?No results for input(s): HGBA1C in the last 72 hours. ?CBG: ?Recent Labs  ?Lab 06/03/21 ?1009 06/09/21 ?0024 06/09/21 ?0740 06/09/21 ?1139  ?GLUCAP 238* 108* 117* 153*  ? ?Lipid Profile: ?No results for input(s): CHOL, HDL, LDLCALC, TRIG, CHOLHDL, LDLDIRECT in the last 72 hours. ?Thyroid Function Tests: ?No results for input(s): TSH, T4TOTAL, FREET4, T3FREE, THYROIDAB in the last 72 hours. ?Anemia Panel: ?No results for input(s): VITAMINB12, FOLATE, FERRITIN, TIBC, IRON, RETICCTPCT in the last 72 hours. ?Sepsis Labs: ?No results for input(s): PROCALCITON, LATICACIDVEN in the last 168 hours. ? ?Recent Results (from the past 240 hour(s))  ?Urine Culture     Status: Abnormal  ? Collection Time: 06/03/21  9:32 AM  ? Specimen: Urine, Clean Catch  ?Result Value Ref Range Status  ? Specimen Description   Final  ?  URINE, CLEAN CATCH ?Performed at [facility], [place] [address], [place], [place] [address] ?  ? Special Requests   Final  ?  NONE ?Performed at [facility], [place] [address] [place], [place] [address] ?  ? Culture >=100,000 COLONIES/mL ESCHERICHIA COLI (A)  Final  ? Report Status 06/06/2021  FINAL  Final  ? Organism ID, Bacteria ESCHERICHIA COLI (A)  Final  ?    Susceptibility  ? Escherichia coli - MIC*  ?  AMPICILLIN >=32 RESISTANT Resistant   ?  CEFAZOLIN >=64 RESISTANT Resistant   ?  CEFEPIME <=0.12 SENSITIVE Sensitive   ?  CEFTRIAXONE <=0.25 SENSITIVE Sensitive   ?  CIPROFLOXACIN <=0.25 SENSITIVE Sensitive   ?  GENTAMICIN <=1 SENSITIVE Sensitive   ?  IMIPENEM <=0.25 SENSITIVE Sensitive   ?  NITROFURANTOIN <=16 SENSITIVE Sensitive   ?  TRIMETH/SULFA <=20 SENSITIVE Sensitive   ?  AMPICILLIN/SULBACTAM >=32 RESISTANT Resistant   ?  PIP/TAZO >=128 RESISTANT Resistant   ?  * >=100,000 COLONIES/mL ESCHERICHIA COLI  ?Resp Panel by RT-PCR (Flu A&B, Covid) Nasopharyngeal Swab     Status: None  ? Collection Time: 06/03/21 10:08 AM  ? Specimen: Nasopharyngeal Swab; Nasopharyngeal(NP) swabs in vial transport medium  ?Result Value Ref Range Status  ? SARS Coronavirus 2 by RT PCR NEGATIVE NEGATIVE Final  ?  Comment: (NOTE) ?  SARS-CoV-2 target nucleic acids are NOT DETECTED. ? ?The SARS-CoV-2 RNA is generally detectable in upper respiratory ?specimens during the acute phase of infection. The lowest ?concentration of SARS-CoV-2 viral copies this assay can detect is ?138 copies/mL. A negative result does not preclude SARS-Cov-2 ?infection and should not be used as the sole basis for treatment or ?other patient management decisions. A negative result may occur with  ?improper specimen collection/handling, submission of specimen other ?than nasopharyngeal swab, presence of viral mutation(s) within the ?areas targeted by this assay, and inadequate number of viral ?copies(<138 copies/mL). A negative result must be combined with ?clinical observations, patient history, and epidemiological ?information. The expected result is Negative. ? ?Fact Sheet for Patients:  ?EntrepreneurPulse.com.au ? ?Fact Sheet for Healthcare Providers:  ?IncredibleEmployment.be ? ?This test is no t yet approved  or cleared by the [place] FDA and  ?has been authorized for detection and/or diagnosis of SARS-CoV-2 by ?FDA under an Emergency Use Authorization (EUA). This EUA will remain  ?in effect (meaning this test

## 2021-06-09 NOTE — TOC Progression Note (Signed)
Transition of Care (TOC) - Progression Note  ? ? ?Patient Details  ?Name: Melinda Stone ?MRN: [id number] ?Date of Birth: 26-Aug-1980 ? ?Transition of Care (TOC) CM/SW Contact  ?Servando Snare, LCSW ?Phone Number: ?06/09/2021, 10:26 AM ? ?Clinical Narrative:    ? ? ?Transition of Care (TOC) Screening Note ? ? ?Patient Details  ?Name: Melinda Stone ?Date of Birth: 07-12-1980 ? ? ?Transition of Care (TOC) CM/SW Contact:    ?Servando Snare, LCSW ?Phone Number: ?06/09/2021, 10:26 AM ? ? ? ?Transition of Care Department [facility]) has reviewed patient and no TOC needs have been identified at this time. We will continue to monitor patient advancement through interdisciplinary progression rounds. If new patient transition needs arise, please place a TOC consult. ? ? ? ?  ?  ? ?Expected Discharge Plan and Services ?  ?  ?  ?  ?  ?                ?  ?  ?  ?  ?  ?  ?  ?  ?  ?  ? ? ?Social Determinants of Health (SDOH) Interventions ?  ? ?Readmission Risk Interventions ?   ? View : No data to display.  ?  ?  ?  ? ? ?

## 2021-06-10 DIAGNOSIS — R112 Nausea with vomiting, unspecified: Secondary | ICD-10-CM | POA: Diagnosis not present

## 2021-06-10 LAB — GLUCOSE, CAPILLARY
Glucose-Capillary: 136 mg/dL — ABNORMAL HIGH (ref 70–99)
Glucose-Capillary: 166 mg/dL — ABNORMAL HIGH (ref 70–99)
Glucose-Capillary: 290 mg/dL — ABNORMAL HIGH (ref 70–99)
Glucose-Capillary: 195 mg/dL — ABNORMAL HIGH (ref 70–99)

## 2021-06-10 MED ORDER — METHYLPREDNISOLONE SODIUM SUCC 40 MG IJ SOLR
40.0000 mg | Freq: Every day | INTRAMUSCULAR | Status: DC
Start: 2021-06-10 — End: 2021-06-11
  Administered 2021-06-10 – 2021-06-11 (×2): 40 mg via INTRAVENOUS
  Filled 2021-06-10 (×2): qty 1

## 2021-06-10 MED ORDER — AZITHROMYCIN 250 MG PO TABS
500.0000 mg | ORAL_TABLET | Freq: Every day | ORAL | Status: DC
  Administered 2021-06-10: 500 mg via ORAL
  Filled 2021-06-10: qty 2

## 2021-06-10 NOTE — Progress Notes (Signed)
?PROGRESS NOTE ? ? ? ?Melinda Stone  [id number] DOB: Nov 12, 1980 DOA: 06/08/2021 ?PCP: Lauretta Grill, NP  ? ?Brief Narrative: 41 year old female with history of type 2 diabetes, tobacco abuse, neuropathy, chronic pain admitted with nausea vomiting diarrhea and dizziness.  Patient came to the ER 6 days prior to admission with similar complaints.  Her urine culture at that time came back positive for E. coli nitrofurantoin was called in however patient has not picked it up.  She continued with ongoing dizziness nausea vomiting diarrhea and left flank pain.  She is admitted with diagnosis of pyelonephritis E. coli urinary tract infection on IV Rocephin. ? ?Assessment & Plan: ?  ?Principal Problem: ?  Nausea & vomiting ?Active Problems: ?  UTI (urinary tract infection) ? ? ?#1 E. coli urinary tract infection with bilateral flank pain renal ultrasound with right mild hydronephrosis. ?Renal ultrasound mild right hydronephrosis ?CT abdomen pelvis without contrast no renal stones ?E. coli sensitive to Rocephin continue. ?Patient with 10 out of 10 pain to the flanks. ?Discussed with urology. ?At the time of admission patient was tachycardic with leukocytosis.  Leukocytosis have resolved she is still intermittently tachycardic. ? ?#2 hypokalemia replete and recheck in a.m. check mag level.  Labs are pending for this morning. ? ?#3 type 2 diabetes continue SSI. ? ?#4 chronic anemia patient reports her hemoglobin drops meant during her menstrual cycle.  Will monitor closely.  Labs are pending for this morning. ? ?#5 nausea vomiting and diarrhea continue IV fluids  ? ?#6 dizziness orthostatics pending continue IV fluids ? ?#7  COPD exacerbation lung nodules by CT chest in the setting of tobacco use patient with groundglass nodules in the left upper lobe.  COVID-negative.  Continue Rocephin and Zithromax.  Patient has productive cough and shortness of breath. ?Patient is wheezing more today with history of tobacco  abuse and complaints of cough and shortness of breath.  Add Solu-Medrol 40 mg daily. ? ? ? ?Estimated body mass index is 28.5 kg/m? as calculated from the following: ?  Height as of this encounter: 5\' 9"  (1.753 m). ?  Weight as of this encounter: 87.5 kg. ? ?DVT prophylaxis: NONE ?Code Status: FULL ?Family Communication: NONE ?Disposition Plan:  Status is: Inpatient ?Remains inpatient appropriate because: IV ROCEPHIN ?  ?Consultants:  ?NONE ? ?Procedures: NONE ?Antimicrobials:ROCEPHIN AZITHROMYCIN ? ?Subjective: ? ?Patient is resting in bed she is coughing productive complains of shortness of breath ?She quit smoking few days ago prior to admission ?Objective: ?Vitals:  ? 06/09/21 0000 06/09/21 0432 06/09/21 1356 06/09/21 2023  ?BP:  122/75 117/68 125/76  ?Pulse:  (!) 104 (!) 105 91  ?Resp:  18 14 16   ?Temp:  98.8 ?F (37.1 ?C) 98.3 ?F (36.8 ?C) 98 ?F (36.7 ?C)  ?TempSrc:  Oral Oral Oral  ?SpO2:  100% 100% 100%  ?Weight: 87.5 kg     ?Height: 5\' 9"  (1.753 m)     ? ? ?Intake/Output Summary (Last 24 hours) at 06/10/2021 1139 ?Last data filed at 06/10/2021 1000 ?Gross per 24 hour  ?Intake 3220.1 ml  ?Output 1900 ml  ?Net 1320.1 ml  ? ? ?Filed Weights  ? 06/09/21 0000  ?Weight: 87.5 kg  ? ? ?Examination: ? ?General exam: Appears calm and comfortable  ?Respiratory system: wheezing and coarse to auscultation. Respiratory effort normal. ?Cardiovascular system: S1 & S2 heard, RRR. No JVD, murmurs, rubs, gallops or clicks. No pedal edema. ?Gastrointestinal system: Abdomen is nondistended, soft and nontender. No organomegaly or masses felt. Normal  bowel sounds heard.  Bilateral CVA tenderness right more than left ?Central nervous system: Alert and oriented. No focal neurological deficits. ?Extremities: Symmetric 5 x 5 power. ?Skin: No rashes, lesions or ulcers ?Psychiatry: Judgement and insight appear normal. Mood & affect appropriate.  ? ? ? ?Data Reviewed: I have personally reviewed following labs and imaging  studies ? ?CBC: ?Recent Labs  ?Lab 06/08/21 ?2049 06/09/21 ?0353  ?WBC 13.9* 10.1  ?NEUTROABS 9.4*  --   ?HGB 9.0* 7.7*  ?HCT 25.8* 22.1*  ?MCV 76.3* 78.1*  ?PLT 200 157  ? ? ?Basic Metabolic Panel: ?Recent Labs  ?Lab 06/08/21 ?2049 06/09/21 ?0353  ?NA 136 138  ?K 3.5 3.3*  ?CL 110 114*  ?CO2 21* 20*  ?GLUCOSE 133* 107*  ?BUN 11 8  ?CREATININE 0.55 0.53  ?CALCIUM 8.6* 8.1*  ?MG  --  1.7  ? ? ?GFR: ?Estimated Creatinine Clearance: 111.3 mL/min (by C-G formula based on SCr of 0.53 mg/dL). ?Liver Function Tests: ?Recent Labs  ?Lab 06/08/21 ?2049 06/09/21 ?0353  ?AST 14* 12*  ?ALT 13 11  ?ALKPHOS 132* 108  ?BILITOT 1.4* 1.3*  ?PROT 6.9 5.8*  ?ALBUMIN 3.4* 2.9*  ? ? ?No results for input(s): LIPASE, AMYLASE in the last 168 hours. ?No results for input(s): AMMONIA in the last 168 hours. ?Coagulation Profile: ?No results for input(s): INR, PROTIME in the last 168 hours. ?Cardiac Enzymes: ?No results for input(s): CKTOTAL, CKMB, CKMBINDEX, TROPONINI in the last 168 hours. ?BNP (last 3 results) ?No results for input(s): PROBNP in the last 8760 hours. ?HbA1C: ?No results for input(s): HGBA1C in the last 72 hours. ?CBG: ?Recent Labs  ?Lab 06/09/21 ?0740 06/09/21 ?1139 06/09/21 ?1630 06/09/21 ?2126 06/10/21 ?0731  ?GLUCAP 117* 153* 195* 147* 136*  ? ? ?Lipid Profile: ?No results for input(s): CHOL, HDL, LDLCALC, TRIG, CHOLHDL, LDLDIRECT in the last 72 hours. ?Thyroid Function Tests: ?No results for input(s): TSH, T4TOTAL, FREET4, T3FREE, THYROIDAB in the last 72 hours. ?Anemia Panel: ?No results for input(s): VITAMINB12, FOLATE, FERRITIN, TIBC, IRON, RETICCTPCT in the last 72 hours. ?Sepsis Labs: ?No results for input(s): PROCALCITON, LATICACIDVEN in the last 168 hours. ? ?Recent Results (from the past 240 hour(s))  ?Urine Culture     Status: Abnormal  ? Collection Time: 06/03/21  9:32 AM  ? Specimen: Urine, Clean Catch  ?Result Value Ref Range Status  ? Specimen Description   Final  ?  URINE, CLEAN CATCH ?Performed at [facility], [place] [address] [place], [place] [address] ?  ? Special Requests   Final  ?  NONE ?Performed at [facility], [place] [address], [place], [place] [address] ?  ? Culture >=100,000 COLONIES/mL ESCHERICHIA COLI (A)  Final  ? Report Status 06/06/2021 FINAL  Final  ? Organism ID, Bacteria ESCHERICHIA COLI (A)  Final  ?    Susceptibility  ? Escherichia coli - MIC*  ?  AMPICILLIN >=32 RESISTANT Resistant   ?  CEFAZOLIN >=64 RESISTANT Resistant   ?  CEFEPIME <=0.12 SENSITIVE Sensitive   ?  CEFTRIAXONE <=0.25 SENSITIVE Sensitive   ?  CIPROFLOXACIN <=0.25 SENSITIVE Sensitive   ?  GENTAMICIN <=1 SENSITIVE Sensitive   ?  IMIPENEM <=0.25 SENSITIVE Sensitive   ?  NITROFURANTOIN <=16 SENSITIVE Sensitive   ?  TRIMETH/SULFA <=20 SENSITIVE Sensitive   ?  AMPICILLIN/SULBACTAM >=32 RESISTANT Resistant   ?  PIP/TAZO >=128 RESISTANT Resistant   ?  * >=100,000 COLONIES/mL ESCHERICHIA COLI  ?Resp Panel by RT-PCR (Flu A&B, Covid)  Nasopharyngeal Swab     Status: None  ? Collection Time: 06/03/21 10:08 AM  ? Specimen: Nasopharyngeal Swab; Nasopharyngeal(NP) swabs in vial transport medium  ?Result Value Ref Range Status  ? SARS Coronavirus 2 by RT PCR NEGATIVE NEGATIVE Final  ?  Comment: (NOTE) ?SARS-CoV-2 target nucleic acids are NOT DETECTED. ? ?The SARS-CoV-2 RNA is generally detectable in upper respiratory ?specimens during the acute phase of infection. The lowest ?concentration of SARS-CoV-2 viral copies this assay can detect is ?138 copies/mL. A negative result does not preclude SARS-Cov-2 ?infection and should not be used as the sole basis for treatment or ?other patient management decisions. A negative result may occur with  ?improper specimen collection/handling, submission of specimen other ?than nasopharyngeal swab, presence of viral mutation(s) within the ?areas targeted by this assay, and inadequate number of viral ?copies(<138 copies/mL). A negative result must be combined with ?clinical  observations, patient history, and epidemiological ?information. The expected result is Negative. ? ?Fact Sheet for Patients:  ?EntrepreneurPulse.com.au ? ?Fact Sheet for Healthcare Providers:  ?https://www

## 2021-06-11 ENCOUNTER — Inpatient Hospital Stay (HOSPITAL_COMMUNITY): Payer: Medicaid Other

## 2021-06-11 DIAGNOSIS — R112 Nausea with vomiting, unspecified: Secondary | ICD-10-CM | POA: Diagnosis not present

## 2021-06-11 LAB — GLUCOSE, CAPILLARY
Glucose-Capillary: 128 mg/dL — ABNORMAL HIGH (ref 70–99)
Glucose-Capillary: 235 mg/dL — ABNORMAL HIGH (ref 70–99)

## 2021-06-11 LAB — COMPREHENSIVE METABOLIC PANEL
ALT: 13 U/L (ref 0–44)
AST: 15 U/L (ref 15–41)
Albumin: 3 g/dL — ABNORMAL LOW (ref 3.5–5.0)
Alkaline Phosphatase: 105 U/L (ref 38–126)
Anion gap: 6 (ref 5–15)
BUN: <5 mg/dL — ABNORMAL LOW (ref 6–20)
CO2: 22 mmol/L (ref 22–32)
Calcium: 8.5 mg/dL — ABNORMAL LOW (ref 8.9–10.3)
Chloride: 109 mmol/L (ref 98–111)
Creatinine, Ser: 0.59 mg/dL (ref 0.44–1.00)
GFR, Estimated: >60 mL/min (ref >60)
Glucose, Bld: 182 mg/dL — ABNORMAL HIGH (ref 70–99)
Potassium: 3.3 mmol/L — ABNORMAL LOW (ref 3.5–5.1)
Sodium: 137 mmol/L (ref 135–145)
Total Bilirubin: 0.9 mg/dL (ref 0.3–1.2)
Total Protein: 5.7 g/dL — ABNORMAL LOW (ref 6.5–8.1)

## 2021-06-11 LAB — CBC
HCT: 22.4 % — ABNORMAL LOW (ref 36.0–46.0)
Hemoglobin: 7.6 g/dL — ABNORMAL LOW (ref 12.0–15.0)
MCH: 26.8 pg (ref 26.0–34.0)
MCHC: 33.9 g/dL (ref 30.0–36.0)
MCV: 78.9 fL — ABNORMAL LOW (ref 80.0–100.0)
Platelets: 181 10*3/uL (ref 150–400)
RBC: 2.84 MIL/uL — ABNORMAL LOW (ref 3.87–5.11)
RDW: 20.8 % — ABNORMAL HIGH (ref 11.5–15.5)
WBC: 10.7 10*3/uL — ABNORMAL HIGH (ref 4.0–10.5)
nRBC: 0.2 % (ref 0.0–0.2)

## 2021-06-11 LAB — URINE CULTURE: Culture: >=100000 — AB

## 2021-06-11 MED ORDER — CEFDINIR 300 MG PO CAPS: 300.0000 mg | ORAL_CAPSULE | Freq: Two times a day (BID) | ORAL | 0 refills | Status: DC

## 2021-06-11 MED ORDER — ONDANSETRON HCL 4 MG PO TABS: 4.0000 mg | ORAL_TABLET | Freq: Four times a day (QID) | ORAL | 0 refills | Status: DC | PRN

## 2021-06-11 MED ORDER — PREDNISONE 10 MG PO TABS: 30.0000 mg | ORAL_TABLET | Freq: Every day | ORAL | 0 refills | Status: AC

## 2021-06-11 MED ORDER — PANTOPRAZOLE SODIUM 40 MG PO TBEC: 40.0000 mg | DELAYED_RELEASE_TABLET | Freq: Every day | ORAL | 0 refills | Status: DC

## 2021-06-11 MED ORDER — CEFDINIR 300 MG PO CAPS: 300.0000 mg | ORAL_CAPSULE | Freq: Two times a day (BID) | ORAL | Status: DC

## 2021-06-11 MED ORDER — AZITHROMYCIN 250 MG PO TABS: ORAL_TABLET | ORAL | 0 refills | Status: DC

## 2021-06-11 MED ORDER — PANTOPRAZOLE SODIUM 40 MG PO TBEC
40.0000 mg | DELAYED_RELEASE_TABLET | Freq: Every day | ORAL | Status: DC
  Administered 2021-06-11: 40 mg via ORAL
  Filled 2021-06-11: qty 1

## 2021-06-11 NOTE — Discharge Summary (Signed)
Physician Discharge Summary  ?Melinda Stone QPR:[id number] DOB: 1980/09/24 DOA: 06/08/2021 ? ?PCP: Lauretta Grill, NP ? ?Admit date: 06/08/2021 ?Discharge date: 06/11/2021 ? ?Admitted From: Home ?Disposition: Home  ? ?Recommendations for Outpatient Follow-up:  ?Follow up with PCP in 1-2 weeks ?Please obtain BMP/CBC in one week: ? ?Home Health: None ?Equipment/Devices: None ?Discharge Condition: Stable ?CODE STATUS: Full code ?Diet recommendation: Cardiac ?Brief/Interim Summary: ? ?41 year old female with history of type 2 diabetes, tobacco abuse, neuropathy, chronic pain admitted with nausea vomiting diarrhea and dizziness.  Patient came to the ER 6 days prior to admission with similar complaints.  Her urine culture at that time came back positive for E. coli nitrofurantoin was called in however patient has not picked it up.  She continued with ongoing dizziness nausea vomiting diarrhea and left flank pain.  She is admitted with diagnosis of pyelonephritis E. coli urinary tract infection on IV Rocephin. ? ?Discharge Diagnoses:  ?Principal Problem: ?  Nausea & vomiting ?Active Problems: ?  UTI (urinary tract infection) ? ?#1 E. coli urinary tract infection with bilateral flank pain renal ultrasound with right mild hydronephrosis. ?Renal ultrasound mild right hydronephrosis ?CT abdomen pelvis without contrast no renal stones ?She was discharged on cefdinir for 7 days. ?Discussed with urology. ?  ?#2 hypokalemia resolved ?  ?#3 type 2 diabetes continue home meds. ?  ?#4 chronic anemia patient reports her hemoglobin drops meant during her menstrual cycle.  Hemoglobin remained stable ? ?#5 nausea vomiting and diarrhea resolved ?  ?#6 dizziness resolved ? ?#7  COPD exacerbation lung nodules by CT chest in the setting of tobacco use patient with groundglass nodules in the left upper lobe.  COVID-negative.  She was treated with Rocephin and Zithromax.  She was discharged on cefdinir and Zithromax and prednisone.     ? ?Estimated body mass index is 28.5 kg/m? as calculated from the following: ?  Height as of this encounter: 5' 9"  (1.753 m). ?  Weight as of this encounter: 87.5 kg. ? ?Discharge Instructions ? ?Discharge Instructions   ? ? Diet - low sodium heart healthy   Complete by: As directed ?  ? Increase activity slowly   Complete by: As directed ?  ? ?  ? ?Allergies as of 06/11/2021   ?No Known Allergies ?  ? ?  ?Medication List  ?  ? ?STOP taking these medications   ? ?cephALEXin 500 MG capsule ?Commonly known as: KEFLEX ?  ?penicillin v potassium 500 MG tablet ?Commonly known as: VEETID ?  ? ?  ? ?TAKE these medications   ? ?Accu-Chek Guide Me w/Device Kit ?Use as instructed to check blood sugar daily ?  ?Accu-Chek Guide test strip ?Generic drug: glucose blood ?Use as instructed to check blood sugar 2 times daily ?  ?azithromycin 250 MG tablet ?Commonly known as: ZITHROMAX ?Take 1 tablet daily for 3 days ?  ?cefdinir 300 MG capsule ?Commonly known as: OMNICEF ?Take 1 capsule (300 mg total) by mouth every 12 (twelve) hours. ?  ?chlorhexidine 0.12 % solution ?Commonly known as: PERIDEX ?Use as directed 5 mLs in the mouth or throat 2 (two) times daily. ?  ?gabapentin 800 MG tablet ?Commonly known as: NEURONTIN ?Take 800 mg by mouth in the morning, at noon, in the evening, and at bedtime. ?  ?glipiZIDE 10 MG tablet ?Commonly known as: GLUCOTROL ?Take 2 tablets (20 mg total) by mouth 2 (two) times daily before a meal. ?  ?ondansetron 4 MG tablet ?Commonly known as: ZOFRAN ?Take  1 tablet (4 mg total) by mouth every 6 (six) hours as needed for nausea. ?  ?oxyCODONE-acetaminophen 5-325 MG tablet ?Commonly known as: PERCOCET/ROXICET ?Take 1 tablet by mouth every 6 (six) hours as needed for moderate pain or severe pain. ?  ?pantoprazole 40 MG tablet ?Commonly known as: PROTONIX ?Take 1 tablet (40 mg total) by mouth daily. ?Start taking on: June 12, 2021 ?  ?predniSONE 10 MG tablet ?Commonly known as: DELTASONE ?Take 3 tablets (30  mg total) by mouth daily with breakfast for 5 days. ?  ?tiZANidine 4 MG tablet ?Commonly known as: ZANAFLEX ?Take 4 mg by mouth in the morning, at noon, in the evening, and at bedtime. ?  ?Trulicity 1.5 UD/1.4HF Sopn ?Generic drug: Dulaglutide ?Inject 1.5 mg into the skin once a week. ?  ?Ventolin HFA 108 (90 Base) MCG/ACT inhaler ?Generic drug: albuterol ?2 puffs every 6 (six) hours as needed for wheezing or shortness of breath. ?  ? ?  ? ? Follow-up Information   ? ? Lauretta Grill, NP Follow up.   ?Specialty: Nurse Practitioner ?Contact information: ?[address] [address] ?[place] [address] ?[phone] ? ? ?  ?  ? ?  ?  ? ?  ? ?No Known Allergies ? ?Consultations: ?Discussed with Dr. Gloriann Loan over the phone ? ?Procedures/Studies: ?CT ABDOMEN PELVIS WO CONTRAST ? ?Result Date: 06/09/2021 ?CLINICAL DATA:  Urinary tract infection. EXAM: CT ABDOMEN AND PELVIS WITHOUT CONTRAST TECHNIQUE: Multidetector CT imaging of the abdomen and pelvis was performed following the standard protocol without IV contrast. RADIATION DOSE REDUCTION: This exam was performed according to the departmental dose-optimization program which includes automated exposure control, adjustment of the mA and/or kV according to patient size and/or use of iterative reconstruction technique. COMPARISON:  Renal ultrasound dated 06/09/2021 and CT abdomen pelvis dated 01/11/2019. FINDINGS: Evaluation of this exam is limited in the absence of intravenous contrast. Lower chest: Minimal left lung base atelectasis. No intra-abdominal free air.  Small free fluid within the pelvis. Hepatobiliary: The liver is unremarkable. No intrahepatic biliary dilatation. Cholecystectomy. No retained calcified stone noted in the central CBD. Pancreas: Unremarkable. No pancreatic ductal dilatation or surrounding inflammatory changes. Spleen: Normal in size without focal abnormality. Adrenals/Urinary Tract: The adrenal glands unremarkable. There is mild fullness of the right renal pelvis  and collecting system. No stone identified. The left kidney is unremarkable. The visualized ureters and urinary bladder appear unremarkable. Stomach/Bowel: No bowel obstruction or active inflammation. The appendix is normal. Vascular/Lymphatic: The abdominal aorta and IVC are unremarkable on this noncontrast CT. No portal venous gas. There is no adenopathy. Reproductive: The uterus is anteverted and grossly unremarkable. No adnexal masses. Other: None Musculoskeletal: Degenerative changes primarily at L4-L5 with disc space narrowing and endplate irregularity. No acute osseous pathology. IMPRESSION: 1. Mild fullness of the right renal pelvis and collecting system. No stone identified. 2. No bowel obstruction. Normal appendix. Electronically Signed   By: Anner Crete M.D.   On: 06/09/2021 19:51  ? ?DG Chest 1 View ? ?Result Date: 06/03/2021 ?CLINICAL DATA:  Shortness of breath.  Emesis. EXAM: CHEST  1 VIEW COMPARISON:  03/26/2021 FINDINGS: The heart size and mediastinal contours are within normal limits. Both lungs are clear. The visualized skeletal structures are unremarkable. IMPRESSION: No active disease. Electronically Signed   By: Kerby Moors M.D.   On: 06/03/2021 10:36  ? ?DG Abd 1 View ? ?Result Date: 06/11/2021 ?CLINICAL DATA:  Abdominal pain with nausea and vomiting for 1 month EXAM: ABDOMEN - 1 VIEW COMPARISON:  CT abdomen pelvis dated 06/09/2021. FINDINGS: The bowel gas pattern is normal. Air-fluid levels and free intraperitoneal air cannot be excluded on the supine exam. No radio-opaque calculi or other significant radiographic abnormality are seen. IMPRESSION: Nonobstructive bowel gas pattern. Electronically Signed   By: Zerita Boers M.D.   On: 06/11/2021 13:33  ? ?CT Head Wo Contrast ? ?Result Date: 06/08/2021 ?CLINICAL DATA:  Headaches with nausea and vomiting for few days, initial encounter EXAM: CT HEAD WITHOUT CONTRAST TECHNIQUE: Contiguous axial images were obtained from the base of the skull  through the vertex without intravenous contrast. RADIATION DOSE REDUCTION: This exam was performed according to the departmental dose-optimization program which includes automated exposure control, adjustme

## 2021-06-11 NOTE — Plan of Care (Signed)

## 2021-06-12 ENCOUNTER — Other Ambulatory Visit: Payer: Self-pay

## 2021-06-12 ENCOUNTER — Encounter (HOSPITAL_COMMUNITY): Payer: Self-pay

## 2021-06-12 ENCOUNTER — Emergency Department (HOSPITAL_COMMUNITY)
Admission: EM | Admit: 2021-06-12 | Discharge: 2021-06-12 | Disposition: A | Discharge status: Home / Self Care | Payer: Medicaid Other | Attending: Student | Admitting: Student

## 2021-06-12 DIAGNOSIS — Z7984 Long term (current) use of oral hypoglycemic drugs: Secondary | ICD-10-CM | POA: Diagnosis not present

## 2021-06-12 DIAGNOSIS — E119 Type 2 diabetes mellitus without complications: Secondary | ICD-10-CM | POA: Diagnosis not present

## 2021-06-12 DIAGNOSIS — R799 Abnormal finding of blood chemistry, unspecified: Secondary | ICD-10-CM | POA: Diagnosis present

## 2021-06-12 DIAGNOSIS — D649 Anemia, unspecified: Secondary | ICD-10-CM | POA: Diagnosis not present

## 2021-06-12 DIAGNOSIS — R197 Diarrhea, unspecified: Secondary | ICD-10-CM | POA: Insufficient documentation

## 2021-06-12 LAB — CBC WITH DIFFERENTIAL/PLATELET
Abs Immature Granulocytes: 0.12 10*3/uL — ABNORMAL HIGH (ref 0.00–0.07)
Basophils Absolute: 0.1 10*3/uL (ref 0.0–0.1)
Basophils Relative: 1 %
Eosinophils Absolute: 0 10*3/uL (ref 0.0–0.5)
Eosinophils Relative: 0 %
HCT: 27.5 % — ABNORMAL LOW (ref 36.0–46.0)
Hemoglobin: 9.4 g/dL — ABNORMAL LOW (ref 12.0–15.0)
Immature Granulocytes: 1 %
Lymphocytes Relative: 10 %
Lymphs Abs: 1.2 10*3/uL (ref 0.7–4.0)
MCH: 27.1 pg (ref 26.0–34.0)
MCHC: 34.2 g/dL (ref 30.0–36.0)
MCV: 79.3 fL — ABNORMAL LOW (ref 80.0–100.0)
Monocytes Absolute: 0.3 10*3/uL (ref 0.1–1.0)
Monocytes Relative: 3 %
Neutro Abs: 9.7 10*3/uL — ABNORMAL HIGH (ref 1.7–7.7)
Neutrophils Relative %: 85 %
Platelets: 218 10*3/uL (ref 150–400)
RBC: 3.47 MIL/uL — ABNORMAL LOW (ref 3.87–5.11)
RDW: 22.5 % — ABNORMAL HIGH (ref 11.5–15.5)
WBC: 11.4 10*3/uL — ABNORMAL HIGH (ref 4.0–10.5)
nRBC: 0.2 % (ref 0.0–0.2)

## 2021-06-12 LAB — POC OCCULT BLOOD, ED: Fecal Occult Bld: NEGATIVE

## 2021-06-12 LAB — TYPE AND SCREEN
ABO/RH(D): A POS
Antibody Screen: NEGATIVE

## 2021-06-12 LAB — I-STAT BETA HCG BLOOD, ED (MC, WL, AP ONLY): I-stat hCG, quantitative: <5 m[IU]/mL (ref <5)

## 2021-06-12 LAB — COMPREHENSIVE METABOLIC PANEL
ALT: 16 U/L (ref 0–44)
AST: 31 U/L (ref 15–41)
Albumin: 3.9 g/dL (ref 3.5–5.0)
Alkaline Phosphatase: 124 U/L (ref 38–126)
Anion gap: 13 (ref 5–15)
BUN: 5 mg/dL — ABNORMAL LOW (ref 6–20)
CO2: 20 mmol/L — ABNORMAL LOW (ref 22–32)
Calcium: 8.4 mg/dL — ABNORMAL LOW (ref 8.9–10.3)
Chloride: 103 mmol/L (ref 98–111)
Creatinine, Ser: 0.67 mg/dL (ref 0.44–1.00)
GFR, Estimated: >60 mL/min (ref >60)
Glucose, Bld: 310 mg/dL — ABNORMAL HIGH (ref 70–99)
Potassium: 4.1 mmol/L (ref 3.5–5.1)
Sodium: 136 mmol/L (ref 135–145)
Total Bilirubin: 1.3 mg/dL — ABNORMAL HIGH (ref 0.3–1.2)
Total Protein: 7.7 g/dL (ref 6.5–8.1)

## 2021-06-12 MED ORDER — LACTATED RINGERS IV BOLUS
1000.0000 mL | Freq: Once | INTRAVENOUS | Status: AC
  Administered 2021-06-12: 1000 mL via INTRAVENOUS

## 2021-06-12 NOTE — ED Provider Notes (Signed)
?[place] DEPT ?Provider Note ? ?CSN: [id number] ?Arrival date & time: 06/12/21 1754 ? ?Chief Complaint(s) ?Abnormal Lab and Diarrhea ? ?HPI ?Melinda Stone is a 41 y.o. female with PMH anemia, T2DM, recent discharge yesterday for pyelonephritis who presents the emergency department for evaluation of an abnormal labs and diarrhea.  Patient currently on antibiotics and received blood work yesterday showing a downtrending hemoglobin from 7.7 3 days ago, 7.6 1-day ago.  Patient called her primary care provider today with complaints of fatigue who instructed her to come to the emergency department for possible transfusion.  She has had no black stools, chest pain, shortness of breath, abdominal pain or other systemic symptoms. ? ? ?Abnormal Lab ?Diarrhea ? ?Past Medical History ?Past Medical History:  ?Diagnosis Date  ? Anemia   ? Arthritis 08/17/2020  ? Chronic back pain   ? Diabetes mellitus without complication ([place])   ? Ovarian cyst   ? Pneumonia   ? Polyneuropathy   ? ?Patient Active Problem List  ? Diagnosis Date Noted  ? Nausea & vomiting 06/08/2021  ? UTI (urinary tract infection) 06/08/2021  ? 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 HNP (herniated nucleus pulposus), lumbar 11/14/2020  ? 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 Chronic pain syndrome 06/14/2020  ? Pharmacologic therapy 06/14/2020  ? Disorder of skeletal system 06/14/2020  ? Problems influencing health status 06/14/2020  ? History of marijuana use 06/14/2020  ? History of illicit drug use 35/46/5681  ? Abnormal drug screen (05/25/2020) 06/14/2020  ? Marijuana use 06/14/2020  ? Abnormal MRI, lumbar spine (05/16/2020) 06/14/2020  ? 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 Lumbar lateral recess stenosis (Left: L4-5, L5-S1) 06/14/2020  ? 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) 06/14/2020  ? S/P lumbar microdiscectomy 06/07/2020  ? 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 GI bleed 12/01/2019  ? Muscle spasm 11/06/2019  ? Sciatica 11/06/2019  ? Low back pain 02/04/2019  ? Type 2 diabetes mellitus with diabetic polyneuropathy, without long-term current use of insulin ([place]) 12/03/2018  ? Type 2 diabetes mellitus with hyperglycemia, without long-term current use of insulin ([place]) 05/15/2018  ? ?Home Medication(s) ?Prior to Admission medications   ?Medication Sig Start Date End Date Taking? Authorizing Provider  ?azithromycin (ZITHROMAX) 250 MG tablet Take 1 tablet daily for 3 days 06/11/21   Georgette Shell, MD  ?Blood Glucose Monitoring Suppl (ACCU-CHEK GUIDE ME) w/Device KIT Use as instructed to check blood sugar daily 07/13/20   Shamleffer, Melanie Crazier, MD  ?cefdinir (OMNICEF) 300 MG capsule Take 1 capsule (300 mg total) by mouth every 12 (twelve) hours. 06/11/21   Georgette Shell, MD  ?chlorhexidine (PERIDEX) 0.12 % solution Use as directed 5 mLs in the mouth or throat 2 (two) times daily. 06/02/21   [provider]  ?Dulaglutide (TRULICITY) 1.5 EX/5.1ZG SOPN Inject 1.5 mg into the skin once a week. 04/12/21   Shamleffer, Melanie Crazier, MD  ?gabapentin (NEURONTIN) 800 MG tablet Take 800 mg by mouth in the morning, at noon, in the evening, and at bedtime. 04/04/21   [provider]  ?glipiZIDE (GLUCOTROL) 10 MG tablet Take 2 tablets (20 mg total) by mouth 2 (two) times daily before a meal. 04/12/21   Shamleffer, Melanie Crazier, MD  ?glucose blood (ACCU-CHEK GUIDE) test strip Use as instructed to check blood sugar 2 times daily 07/13/20   Shamleffer, Melanie Crazier, MD  ?ondansetron (ZOFRAN) 4 MG tablet Take 1 tablet (4 mg  total) by mouth every 6 (six) hours as needed for nausea. 06/11/21   Georgette Shell, MD  ?oxyCODONE-acetaminophen (PERCOCET/ROXICET) 5-325 MG tablet Take 1 tablet by mouth every 6 (six) hours as needed for moderate pain or severe pain. 03/13/21   [provider]  ?pantoprazole (PROTONIX) 40 MG tablet Take 1  tablet (40 mg total) by mouth daily. 06/12/21   Georgette Shell, MD  ?predniSONE (DELTASONE) 10 MG tablet Take 3 tablets (30 mg total) by mouth daily with breakfast for 5 days. 06/11/21 06/16/21  Georgette Shell, MD  ?tiZANidine (ZANAFLEX) 4 MG tablet Take 4 mg by mouth in the morning, at noon, in the evening, and at bedtime. 05/18/21   [provider]  ?VENTOLIN HFA 108 (90 Base) MCG/ACT inhaler 2 puffs every 6 (six) hours as needed for wheezing or shortness of breath. 06/04/21   [provider]  ?                                                                                                                                  ?Past Surgical History ?Past Surgical History:  ?Procedure Laterality Date  ? CESAREAN SECTION    ? CHOLECYSTECTOMY  2004  ? LUMBAR LAMINECTOMY N/A 05/30/2020  ? Procedure: Lumbar five Laminectomy,  Bilateral Microdiscectomy, Left Lumbar five -Sacral one Microdiscectomy;  Surgeon: Marybelle Killings, MD;  Location: [facility];  Service: Orthopedics;  Laterality: N/A;  ? LUMBAR LAMINECTOMY Left 11/14/2020  ? Procedure: LEFT LUMBAR FOUR-FIVE MICRODISCECTOMY;  Surgeon: Marybelle Killings, MD;  Location: [facility];  Service: Orthopedics;  Laterality: Left;  ? TUBAL LIGATION  10/19/2010  ? ?Family History ?Family History  ?Problem Relation Age of Onset  ? Diabetes Mother   ? Hypertension Mother   ? Stroke Mother   ? ? ?Social History ?Social History  ? ?Tobacco Use  ? Smoking status: Every Day  ?  Types: Cigars  ? Smokeless tobacco: Never  ? Tobacco comments:  ?  black and milds per patient 1-2/day  ?Vaping Use  ? Vaping Use: Never used  ?Substance Use Topics  ? Alcohol use: Not Currently  ? Drug use: Not Currently  ?  Types: Marijuana  ?  Comment: per patient stopped smoking marijuana Mar 2022  ? ?Allergies ?Patient has no known allergies. ? ?Review of Systems ?Review of Systems  ?Constitutional:  Positive for fatigue.  ?Gastrointestinal:  Positive for diarrhea.  ? ?Physical Exam ?Vital Signs  ?I  have reviewed the triage vital signs ?BP (!) 142/85   Pulse 89   Temp 98.6 ?F (37 ?C)   Resp 19   Ht _0  (1.753 m)   Wt 87.5 kg   LMP 06/02/2021   SpO2 100%   BMI 28.50 kg/m?  ? ?Physical Exam ?Vitals and nursing note reviewed.  ?Constitutional:   ?   General: She is not in acute distress. ?   Appearance: She is well-developed.  ?  HENT:  ?   Head: Normocephalic and atraumatic.  ?Eyes:  ?   Conjunctiva/sclera: Conjunctivae normal.  ?Cardiovascular:  ?   Rate and Rhythm: Normal rate and regular rhythm.  ?   Heart sounds: No murmur heard. ?Pulmonary:  ?   Effort: Pulmonary effort is normal. No respiratory distress.  ?   Breath sounds: Normal breath sounds.  ?Abdominal:  ?   Palpations: Abdomen is soft.  ?   Tenderness: There is no abdominal tenderness.  ?Genitourinary: ?   Rectum: Guaiac result negative.  ?Musculoskeletal:     ?   General: No swelling.  ?   Cervical back: Neck supple.  ?Skin: ?   General: Skin is warm and dry.  ?   Capillary Refill: Capillary refill takes less than 2 seconds.  ?Neurological:  ?   Mental Status: She is alert.  ?Psychiatric:     ?   Mood and Affect: Mood normal.  ? ? ?ED Results and Treatments ?Labs ?(all labs ordered are listed, but only abnormal results are displayed) ?Labs Reviewed  ?COMPREHENSIVE METABOLIC PANEL - Abnormal; Notable for the following components:  ?    Result Value  ? CO2 20 (*)   ? Glucose, Bld 310 (*)   ? BUN 5 (*)   ? Calcium 8.4 (*)   ? Total Bilirubin 1.3 (*)   ? All other components within normal limits  ?CBC WITH DIFFERENTIAL/PLATELET - Abnormal; Notable for the following components:  ? WBC 11.4 (*)   ? RBC 3.47 (*)   ? Hemoglobin 9.4 (*)   ? HCT 27.5 (*)   ? MCV 79.3 (*)   ? RDW 22.5 (*)   ? Neutro Abs 9.7 (*)   ? Abs Immature Granulocytes 0.12 (*)   ? All other components within normal limits  ?CBC  ?POC OCCULT BLOOD, ED  ?I-STAT BETA HCG BLOOD, ED ([facility], [facility], [facility] ONLY)  ?TYPE AND SCREEN  ?ABO/RH  ?                                                                                                                        ? ?Radiology ?No results found. ? ?Pertinent labs & imaging results that were available during my care of the patient were reviewed by me and consi

## 2021-06-12 NOTE — ED Triage Notes (Signed)
Patient was notified that she had a Hgb level- 7.6. No visible bleeding noted. Patient states she has had diarrhea x 1 1/2 months. ? ?Patient reports that she was recently released from the hospital on 06/11/21 ?

## 2021-06-19 ENCOUNTER — Encounter: Payer: Self-pay | Admitting: *Deleted

## 2021-06-20 ENCOUNTER — Encounter: Payer: Self-pay | Admitting: Diagnostic Neuroimaging

## 2021-06-20 ENCOUNTER — Ambulatory Visit: Payer: Medicaid Other | Admitting: Diagnostic Neuroimaging

## 2021-06-20 VITALS — BP 102/64 | HR 109 | Ht 69.0 in | Wt 195.0 lb

## 2021-06-20 DIAGNOSIS — G43109 Migraine with aura, not intractable, without status migrainosus: Secondary | ICD-10-CM

## 2021-06-20 MED ORDER — TOPIRAMATE 50 MG PO TABS: 50.0000 mg | ORAL_TABLET | Freq: Two times a day (BID) | ORAL | 12 refills | Status: DC

## 2021-06-20 MED ORDER — RIZATRIPTAN BENZOATE 10 MG PO TBDP: 10.0000 mg | ORAL_TABLET | ORAL | 11 refills | Status: DC | PRN

## 2021-06-20 NOTE — Patient Instructions (Signed)
MIGRAINE TREATMENT PLAN: ? ?MIGRAINE PREVENTION  ?LIFESTYLE CHANGES ?-Stop or avoid smoking ?-Decrease or avoid caffeine / alcohol ?-Eat and sleep on a regular schedule ?-Exercise several times per week ?- start topiramate 50mg  at bedtime; after 1-2 weeks increase to 50mg  twice a day; drink plenty of water ? ? ?MIGRAINE RESCUE  ?- ibuprofen, tylenol as needed ?- rizatriptan (Maxalt) 10mg  as needed for breakthrough headache; may repeat x 1 after 2 hours; max 2 tabs per day or 8 per month ?

## 2021-06-20 NOTE — Progress Notes (Signed)
? ?[facility] ? ?PATIENT: Melinda Stone ?DOB: 1980-09-07 ? ?REFERRING CLINICIAN: Lauretta Grill, NP ?HISTORY FROM: patient  ?REASON FOR VISIT: new consult ? ? ?HISTORICAL ? ?CHIEF COMPLAINT:  ?Chief Complaint  ?Patient presents with  ? Migraine  ?  Rm 6, Est Pt with new issue  "migraines > 2 months, dr put me on prescription with caffeine in it- didn't work; I have them 4-5 days a week with vomiting, dizziness, lightheadedness"; noise/light bother me"    ? ? ?HISTORY OF PRESENT ILLNESS:  ? ?UPDATE (06/20/21, [facility]): Since last visit, now with migraine x 2 months. Similar HA in her 20's. Gen throbbing, nausea, sens to light and sound HA. Now 3-4 per week. ? ?PRIOR HPI (11/07/20): 41 year old female with type 2 diabetes and chronic low back pain here for evaluation of lower extremity pain and numbness. ? ?Patient has had diabetes since age 20 years old.  She has had lower extremity numbness and tingling for past 3 to 4 years.  She is also been diagnosed with lumbar spinal stenosis and degenerative changes, status post surgery in March 2022.  Follow-up postoperative imaging demonstrates residual spinal stenosis and radiculopathy changes and she is planning to have additional surgery in the next few weeks. ? ?Patient has been under care of pain management in the past.  She has tried gabapentin, Lyrica, Cymbalta, Robaxin, hydrocodone and tramadol in the past. ? ? ?REVIEW OF SYSTEMS: Full 14 system review of systems performed and negative with exception of: as per HPI. ? ?ALLERGIES: ?No Known Allergies ? ?HOME MEDICATIONS: ?Outpatient Medications Prior to Visit  ?Medication Sig Dispense Refill  ? azithromycin (ZITHROMAX) 250 MG tablet Take 1 tablet daily for 3 days 3 each 0  ? Blood Glucose Monitoring Suppl (ACCU-CHEK GUIDE ME) w/Device KIT Use as instructed to check blood sugar daily 1 kit 0  ? Dulaglutide (TRULICITY) 1.5 BM/8.4XL SOPN Inject 1.5 mg into the skin once a week. 6 mL 3  ? gabapentin  (NEURONTIN) 800 MG tablet Take 800 mg by mouth in the morning, at noon, in the evening, and at bedtime.    ? glipiZIDE (GLUCOTROL) 10 MG tablet Take 2 tablets (20 mg total) by mouth 2 (two) times daily before a meal. 360 tablet 3  ? glucose blood (ACCU-CHEK GUIDE) test strip Use as instructed to check blood sugar 2 times daily 100 each 11  ? ondansetron (ZOFRAN) 4 MG tablet Take 1 tablet (4 mg total) by mouth every 6 (six) hours as needed for nausea. 20 tablet 0  ? oxyCODONE-acetaminophen (PERCOCET/ROXICET) 5-325 MG tablet Take 1 tablet by mouth every 6 (six) hours as needed for moderate pain or severe pain.    ? pantoprazole (PROTONIX) 40 MG tablet Take 1 tablet (40 mg total) by mouth daily. 30 tablet 0  ? tiZANidine (ZANAFLEX) 4 MG tablet Take 4 mg by mouth in the morning, at noon, in the evening, and at bedtime.    ? VENTOLIN HFA 108 (90 Base) MCG/ACT inhaler 2 puffs every 6 (six) hours as needed for wheezing or shortness of breath.    ? chlorhexidine (PERIDEX) 0.12 % solution Use as directed 5 mLs in the mouth or throat 2 (two) times daily. (Patient not taking: Reported on 06/20/2021)    ? cefdinir (OMNICEF) 300 MG capsule Take 1 capsule (300 mg total) by mouth every 12 (twelve) hours. 14 capsule 0  ? ?No facility-administered medications prior to visit.  ? ? ? ?PHYSICAL EXAM ? ?GENERAL EXAM/CONSTITUTIONAL: ?Vitals:  ?  Vitals:  ? 06/20/21 1348  ?BP: 102/64  ?Pulse: (!) 109  ?Weight: 195 lb (88.5 kg)  ?Height: _0  (1.753 m)  ? ?Body mass index is 28.8 kg/m?. ?Wt Readings from Last 3 Encounters:  ?06/20/21 195 lb (88.5 kg)  ?06/12/21 193 lb (87.5 kg)  ?06/09/21 193 lb (87.5 kg)  ? ?Patient is in no distress; well developed, nourished and groomed; neck is supple ? ?CARDIOVASCULAR: ?Examination of carotid arteries is normal; no carotid bruits ?Regular rate and rhythm, no murmurs ?Examination of peripheral vascular system by observation and palpation is normal ? ?EYES: ?Ophthalmoscopic exam of optic discs and  posterior segments is normal; no papilledema or hemorrhages ?No results found. ? ?MUSCULOSKELETAL: ?Gait, strength, tone, movements noted in Neurologic exam below ? ?NEUROLOGIC: ?MENTAL STATUS:  ?   ? View : No data to display.  ?  ?  ?  ? ?awake, alert, oriented to person, place and time ?recent and remote memory intact ?normal attention and concentration ?language fluent, comprehension intact, naming intact ?fund of knowledge appropriate ? ?CRANIAL NERVE:  ?2nd - no papilledema on fundoscopic exam ?2nd, 3rd, 4th, 6th - pupils equal and reactive to light, visual fields full to confrontation, extraocular muscles intact, no nystagmus ?5th - facial sensation symmetric ?7th - facial strength symmetric ?8th - hearing intact ?9th - palate elevates symmetrically, uvula midline ?11th - shoulder shrug symmetric ?12th - tongue protrusion midline ? ?MOTOR:  ?normal bulk and tone, full strength in the BUE, BLE ? ?SENSORY:  ?normal and symmetric to light touch, pinprick, temperature, vibration; EXCEPT DECR IN FEET ? ?COORDINATION:  ?finger-nose-finger, fine finger movements normal ? ?REFLEXES:  ?deep tendon reflexes TRACE and symmetric ? ?GAIT/STATION:  ?narrow based gait; ANTALGIC GAIT ? ? ? ? ?DIAGNOSTIC DATA (LABS, IMAGING, TESTING) ?- I reviewed patient records, labs, notes, testing and imaging myself where available. ? ?Lab Results  ?Component Value Date  ? WBC 11.4 (H) 06/12/2021  ? HGB 9.4 (L) 06/12/2021  ? HCT 27.5 (L) 06/12/2021  ? MCV 79.3 (L) 06/12/2021  ? PLT 218 06/12/2021  ? ?   ?Component Value Date/Time  ? NA 136 06/12/2021 1810  ? NA 136 04/28/2020 1124  ? K 4.1 06/12/2021 1810  ? CL 103 06/12/2021 1810  ? CO2 20 (L) 06/12/2021 1810  ? GLUCOSE 310 (H) 06/12/2021 1810  ? BUN 5 (L) 06/12/2021 1810  ? BUN 4 (L) 04/28/2020 1124  ? CREATININE 0.67 06/12/2021 1810  ? CALCIUM 8.4 (L) 06/12/2021 1810  ? PROT 7.7 06/12/2021 1810  ? PROT 6.3 04/28/2020 1124  ? ALBUMIN 3.9 06/12/2021 1810  ? ALBUMIN 4.0 04/28/2020 1124  ?  AST 31 06/12/2021 1810  ? ALT 16 06/12/2021 1810  ? ALKPHOS 124 06/12/2021 1810  ? BILITOT 1.3 (H) 06/12/2021 1810  ? BILITOT 1.7 (H) 04/28/2020 1124  ? GFRNONAA >60 06/12/2021 1810  ? GFRAA 139 04/28/2020 1124  ? ?Lab Results  ?Component Value Date  ? CHOL 96 (L) 04/28/2020  ? HDL 55 04/28/2020  ? [place] 23 04/28/2020  ? TRIG 97 04/28/2020  ? CHOLHDL 1.7 04/28/2020  ? ?Lab Results  ?Component Value Date  ? HGBA1C 7.6 (A) 04/12/2021  ? ?Lab Results  ?Component Value Date  ? [id number] 564 01/02/2019  ? ?Lab Results  ?Component Value Date  ? TSH 1.05 07/07/2020  ? ? ?10/02/20 MRI lumbar spine  ?1. Postoperative changes from prior decompressive laminectomy with ?micro discectomy at L4-5. Residual and/or recurrent left ?subarticular disc protrusion at this  level with resultant moderate ?canal and severe left lateral recess stenosis. ?2. Sequelae of prior left hemi laminectomy with micro discectomy at ?L5-S1 with small residual and/or recurrent left subarticular to ?foraminal disc protrusion as above. ? ?06/08/21 CT head  ?- No acute intracranial abnormality noted. ? ? ?ASSESSMENT AND PLAN ? ?41 y.o. year old female here with: ? ?Dx: ? ?1. Migraine with aura and without status migrainosus, not intractable   ? ? ?PLAN: ? ?MIGRAINE WITH AURA ? ?MIGRAINE TREATMENT PLAN: ? ?MIGRAINE PREVENTION  ?LIFESTYLE CHANGES ?-Stop or avoid smoking ?-Decrease or avoid caffeine / alcohol ?-Eat and sleep on a regular schedule ?-Exercise several times per week ?- start topiramate 45m at bedtime; after 1-2 weeks increase to 549mtwice a day; drink plenty of water ? ?MIGRAINE RESCUE  ?- ibuprofen, tylenol as needed ?- rizatriptan (Maxalt) 1019ms needed for breakthrough headache; may repeat x 1 after 2 hours; max 2 tabs per day or 8 per month ? ?Meds ordered this encounter  ?Medications  ? topiramate (TOPAMAX) 50 MG tablet  ?  Sig: Take 1 tablet (50 mg total) by mouth 2 (two) times daily.  ?  Dispense:  60 tablet  ?  Refill:  12  ?  rizatriptan (MAXALT-MLT) 10 MG disintegrating tablet  ?  Sig: Take 1 tablet (10 mg total) by mouth as needed for migraine. May repeat in 2 hours if needed  ?  Dispense:  9 tablet  ?  Refill:  11  ? ?Return in about

## 2021-07-04 ENCOUNTER — Ambulatory Visit (INDEPENDENT_AMBULATORY_CARE_PROVIDER_SITE_OTHER): Payer: Medicaid Other | Admitting: Pulmonary Disease

## 2021-07-04 ENCOUNTER — Other Ambulatory Visit (HOSPITAL_COMMUNITY): Payer: Self-pay

## 2021-07-04 ENCOUNTER — Encounter: Payer: Self-pay | Admitting: Pulmonary Disease

## 2021-07-04 ENCOUNTER — Telehealth: Payer: Self-pay

## 2021-07-04 VITALS — BP 116/70 | HR 110 | Temp 97.9°F | Ht 69.0 in | Wt 189.0 lb

## 2021-07-04 DIAGNOSIS — R000 Tachycardia, unspecified: Secondary | ICD-10-CM | POA: Diagnosis not present

## 2021-07-04 DIAGNOSIS — R0602 Shortness of breath: Secondary | ICD-10-CM | POA: Diagnosis not present

## 2021-07-04 DIAGNOSIS — D509 Iron deficiency anemia, unspecified: Secondary | ICD-10-CM

## 2021-07-04 DIAGNOSIS — R053 Chronic cough: Secondary | ICD-10-CM | POA: Diagnosis not present

## 2021-07-04 DIAGNOSIS — J45998 Other asthma: Secondary | ICD-10-CM

## 2021-07-04 MED ORDER — FLUTICASONE-SALMETEROL 250-50 MCG/ACT IN AEPB: 1.0000 | INHALATION_SPRAY | Freq: Two times a day (BID) | RESPIRATORY_TRACT | 5 refills | Status: DC

## 2021-07-04 MED ORDER — TRELEGY ELLIPTA 100-62.5-25 MCG/ACT IN AEPB: 1.0000 | INHALATION_SPRAY | Freq: Every day | RESPIRATORY_TRACT | 6 refills | Status: DC

## 2021-07-04 MED ORDER — TRELEGY ELLIPTA 100-62.5-25 MCG/ACT IN AEPB: 1.0000 | INHALATION_SPRAY | Freq: Every day | RESPIRATORY_TRACT | 0 refills | Status: DC

## 2021-07-04 NOTE — Telephone Encounter (Signed)
Patient Advocate Encounter ? ?Received notification from [employer] that the request for prior authorization for Trelegy has been denied because the patient has not tried 2 of the following preferred meds.:  Advair Diskus & HFA, Dulera & Symbicort. ?  ? ?Specialty Pharmacy Patient Advocate ?Fax: [phone]  ?

## 2021-07-04 NOTE — Progress Notes (Signed)
Subjective:    Patient ID: Melinda Stone, female    DOB: [birth year], 41 y.o.   MRN: [id number] Patient Care Team: Lauretta Grill, NP as PCP - General (Nurse Practitioner)  Chief Complaint  Patient presents with   pulmonary consult    SOB with exertion and laying flat, prod cough with green to yellow sputum and wheezing at night x70mo   HPI Patient is a 41year old occasional smoker (cigars) who presents for evaluation of coughing and shortness of breath with exertion.  She is kindly referred by MLauretta Grill NP.  Patient states that the cough has been present for approximately 5 months.  She notes that when she is talking or laughing the cough is worse.  Cough is also worse at nighttime.  She does not note any relationship to meals.  She is not on ACE inhibitors.  Cough has been accompanied by shortness of breath with activity and occasionally at rest.  Cough is occasionally productive of yellowish to greenish sputum.  No hemoptysis.  She also notes nasal congestion and postnasal drip.  She is not the most engaging historian and will not volunteer many many symptoms.  She feels the cough may be perhaps getting better but not certain.  Review of her records shows that she has significant anemia last H&H on 27 March was 9.4 at 27.5 she has significantly microcytic indices.  She has not had any chest pain but endorses tachypalpitations on occasion.  No gastroesophageal reflux symptoms.  No abdominal pain.  Does not endorse any orthopnea or paroxysmal nocturnal dyspnea.  No lower extremity edema or calf tenderness.  No other symptomatology endorsed.  She has not found anything that relieves her symptoms with regards to cough.  She does note that when she rests her dyspnea is relieved significantly.   Review of Systems A 10 point review of systems was performed and it is as noted above otherwise negative.  Past Medical History:  Diagnosis Date   Anemia    Arthritis 08/17/2020   Chronic  back pain    Diabetes mellitus without complication ([facility]    type 2   Migraine    Ovarian cyst    Pneumonia    Polyneuropathy    Past Surgical History:  Procedure Laterality Date   CESAREAN SECTION     CHOLECYSTECTOMY  2004   LUMBAR LAMINECTOMY N/A 05/30/2020   Procedure: Lumbar five Laminectomy,  Bilateral Microdiscectomy, Left Lumbar five -Sacral one Microdiscectomy;  Surgeon: YMarybelle Killings MD;  [place] [facility]  Service: Orthopedics;  Laterality: N/A;   LUMBAR LAMINECTOMY Left 11/14/2020   Procedure: LEFT LUMBAR FOUR-FIVE MICRODISCECTOMY;  Surgeon: YMarybelle Killings MD;  [facility] [facility]  Service: Orthopedics;  Laterality: Left;   TUBAL LIGATION  10/19/2010   Patient Active Problem List   Diagnosis Date Noted   Nausea & vomiting 06/08/2021   UTI (urinary tract infection) 06/08/2021   Post laminectomy syndrome 04/11/2021   HNP (herniated nucleus pulposus), lumbar 11/14/2020   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   Chronic pain syndrome 06/14/2020   Pharmacologic therapy 06/14/2020   Disorder of skeletal system 06/14/2020   Problems influencing health status 06/14/2020   History of marijuana use 095/62/1308  History of illicit drug use 065/78/4696  Abnormal drug screen (05/25/2020) 06/14/2020   Marijuana use 06/14/2020   Abnormal MRI, lumbar spine (05/16/2020) 06/14/2020   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, L5-S1) 06/14/2020   Lumbar lateral recess stenosis (Left: L4-5, L5-S1)  06/14/2020   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) 06/14/2020   S/P lumbar microdiscectomy 06/07/2020   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   GI bleed 12/01/2019   Muscle spasm 11/06/2019   Sciatica 11/06/2019   Low back pain 02/04/2019   Type 2 diabetes mellitus with diabetic polyneuropathy, without long-term current use of insulin ([place]) 12/03/2018   Type 2 diabetes mellitus with hyperglycemia, without long-term current use of insulin ([place]) 05/15/2018    Family History  Problem Relation Age of Onset   Diabetes Mother    Hypertension Mother    Stroke Mother    Social History   Tobacco Use   Smoking status: Some Days    Types: Cigars   Smokeless tobacco: Never   Tobacco comments:    black and milds-- 5 cigars per week-07/04/2021  Substance Use Topics   Alcohol use: Not Currently   No Known Allergies Current Meds  Medication Sig   Blood Glucose Monitoring Suppl (ACCU-CHEK GUIDE ME) w/Device KIT Use as instructed to check blood sugar daily   chlorhexidine (PERIDEX) 0.12 % solution Use as directed 5 mLs in the mouth or throat 2 (two) times daily.   Dulaglutide (TRULICITY) 1.5 YK/5.9DJ SOPN Inject 1.5 mg into the skin once a week.   Fluticasone-Umeclidin-Vilant (TRELEGY ELLIPTA) 100-62.5-25 MCG/ACT AEPB Inhale 1 puff into the lungs daily.   gabapentin (NEURONTIN) 800 MG tablet Take 800 mg by mouth in the morning, at noon, in the evening, and at bedtime.   glipiZIDE (GLUCOTROL) 10 MG tablet Take 2 tablets (20 mg total) by mouth 2 (two) times daily before a meal.   glucose blood (ACCU-CHEK GUIDE) test strip Use as instructed to check blood sugar 2 times daily   ondansetron (ZOFRAN) 4 MG tablet Take 1 tablet (4 mg total) by mouth every 6 (six) hours as needed for nausea.   oxyCODONE-acetaminophen (PERCOCET/ROXICET) 5-325 MG tablet Take 1 tablet by mouth every 6 (six) hours as needed for moderate pain or severe pain.   pantoprazole (PROTONIX) 40 MG tablet Take 1 tablet (40 mg total) by mouth daily.   rizatriptan (MAXALT-MLT) 10 MG disintegrating tablet Take 1 tablet (10 mg total) by mouth as needed for migraine. May repeat in 2 hours if needed   tiZANidine (ZANAFLEX) 4 MG tablet Take 4 mg by mouth in the morning, at noon, in the evening, and at bedtime.   topiramate (TOPAMAX) 50 MG tablet Take 1 tablet (50 mg total) by mouth 2 (two) times daily.   VENTOLIN HFA 108 (90 Base) MCG/ACT inhaler 2 puffs every 6 (six) hours as needed for wheezing  or shortness of breath.   [DISCONTINUED] azithromycin (ZITHROMAX) 250 MG tablet Take 1 tablet daily for 3 days   Immunization History  Administered Date(s) Administered   Janssen (J&J) SARS-COV-2 Vaccination 08/25/2019   Pneumococcal Polysaccharide-23 05/09/2020       Objective:   Physical Exam BP 116/70 (BP Location: Left Arm, Cuff Size: Normal)   Pulse (!) 110   Temp 97.9 F (36.6 C) (Temporal)   Ht _0  (1.753 m)   Wt 189 lb (85.7 kg)   SpO2 100%   BMI 27.91 kg/m  GENERAL: Well-developed overweight woman, no acute distress, positional dyspnea HEAD: Normocephalic, atraumatic.  EYES: Pupils equal, round, reactive to light.  No scleral icterus.  MOUTH: Edentulous, oral mucosa moist.  No thrush. NECK: Supple. No thyromegaly. Trachea midline. No JVD.  No adenopathy. PULMONARY: Good air entry bilaterally.  Diffuse wheezing throughout.Marland Kitchen CARDIOVASCULAR: S1 and S2.  Tachycardic with regular rhythm.  No rubs murmurs gallops heard. ABDOMEN: Benign. MUSCULOSKELETAL: No joint deformity, no clubbing, no edema.  NEUROLOGIC: No overt focal deficit, no gait disturbance, speech is fluent. SKIN: Intact,warm,dry. PSYCH: Flat affect.     Assessment & Plan:     ICD-10-CM   1. Shortness of breath  R06.02 Pulmonary Function Test [facility] Only    ECHOCARDIOGRAM COMPLETE    Allergen Panel (27) + IGE    CBC w/Diff    Ferritin    Iron Binding Cap (TIBC)([facility])   Suspect multifactorial Obtain PFTs, 2D echo Obtain CBC given anemia    2. Persistent asthma with undetermined severity  J45.998    PFTs should help clarify severity Patient was noted to have significant bronchospasm today Trial of Trelegy Ellipta 100    3. Persistent cough for 3 weeks or longer  R05.3    This may be driven by her asthma/bronchospasm    4. Tachycardia  R00.0    This may be related to her anemia     5. Iron deficiency anemia, unspecified iron deficiency anemia type  D50.9    Iron studies CBC      Orders Placed This Encounter  Procedures   Allergen Panel (27) + IGE    Standing Status:   Future    Number of Occurrences:   1    Standing Expiration Date:   07/05/2022   CBC w/Diff    Standing Status:   Future    Number of Occurrences:   1    Standing Expiration Date:   07/05/2022   Ferritin    Standing Status:   Future    Number of Occurrences:   1    Standing Expiration Date:   07/05/2022   Iron Binding Cap (TIBC)([facility])    Standing Status:   Future    Number of Occurrences:   1    Standing Expiration Date:   07/05/2022   Pulmonary Function Test [facility] Only    Standing Status:   Future    Number of Occurrences:   1    Standing Expiration Date:   07/05/2022    Scheduling Instructions:     Next available.    Order Specific Question:   Full PFT: includes the following: basic spirometry, spirometry pre & post bronchodilator, diffusion capacity (DLCO), lung volumes    Answer:   Full PFT   ECHOCARDIOGRAM COMPLETE    Standing Status:   Future    Number of Occurrences:   1    Standing Expiration Date:   01/03/2022    Order Specific Question:   Where should this test be performed    Answer:   [facility]    Order Specific Question:   Perflutren DEFINITY (image enhancing agent) should be administered unless hypersensitivity or allergy exist    Answer:   Administer Perflutren    Order Specific Question:   Reason for exam-Echo    Answer:   Dyspnea  R06.00   Meds ordered this encounter  Medications   Fluticasone-Umeclidin-Vilant (TRELEGY ELLIPTA) 100-62.5-25 MCG/ACT AEPB    Sig: Inhale 1 puff into the lungs daily.    Dispense:  14 each    Refill:  0    Order Specific Question:   Lot Number?    Answer:   [id number]    Order Specific Question:   Expiration Date?    Answer:   11/18/2022    Order Specific Question:   Manufacturer?    Answer:   [employer] [12]  Order Specific Question:   Quantity    Answer:   1   Fluticasone-Umeclidin-Vilant (TRELEGY ELLIPTA)  100-62.5-25 MCG/ACT AEPB    Sig: Inhale 1 puff into the lungs daily.    Dispense:  28 each    Refill:  6   I suspect the patient's dyspnea is multifactorial.  Anemia may be playing a significant part particularly with the association of tachycardia.  We will reassess the severity of the anemia.  Patient may need referral to hematology for iron infusions.  She was noted to have significant bronchospasm today.  I suspect that this is the main driver of her cough.  We will give her a trial of Trelegy to see if this helps with this symptom.  Of note the patient is on narcotic medications and this may also add to his sensation of dyspnea.  She was asked to limit the use of these medications as much as possible.  She is on these for chronic pain syndrome.  We will see the patient in follow-up in 6 to 8 weeks time she is to contact us prior to that time should any new difficulties arise.   Renold Don, MD Advanced [facility]    *This note was dictated using voice recognition software/Dragon.  Despite best efforts to proofread, errors can occur which can change the meaning. Any transcriptional errors that result from this process are unintentional and may not be fully corrected at the time of dictation.

## 2021-07-04 NOTE — Addendum Note (Signed)
Addended by: Lajoyce Lauber A on: 07/04/2021 03:08 PM ? ? Modules accepted: Orders ? ?

## 2021-07-04 NOTE — Telephone Encounter (Signed)
Patient Advocate Encounter ?  ?Received notification from [facility] that prior authorization for Trelegy Ellipta 100-62.5-25mcg is required by his/her insurance [employer]. ?  ?PA submitted on 07/04/21 ? ?Key#: [id number] ? ?Status is pending ?   ?[facility] will continue to follow: ? ?Patient Advocate ?Fax: [phone]  ?

## 2021-07-04 NOTE — Patient Instructions (Signed)
Your shortness of breath can be caused by many things.  You do have some asthma that I can tell by wheezing that was noted today.  I am concerned however about your low blood (anemia) we are checking those things today.  You may need iron infusions. ? ?We are checking breathing tests and heart test that will tell us more about your shortness of breath.  Will also ordering some blood work.   ? ?We will see you in follow-up 6 to 8 weeks time call sooner should any new problems arise. ?

## 2021-07-04 NOTE — Telephone Encounter (Signed)
Patient is aware of below message and voiced her understanding.  ?Advair sent to preferred pharmacy. ?Per Dr. Delight Stare to complete samples of trelegy prior to starting Advair. Patient voiced understanding.  ?Nothing further needed.  ?

## 2021-07-11 ENCOUNTER — Encounter: Payer: Self-pay | Admitting: Orthopaedic Surgery

## 2021-07-11 ENCOUNTER — Ambulatory Visit: Payer: Medicaid Other | Admitting: Orthopaedic Surgery

## 2021-07-11 VITALS — BP 110/73 | HR 118 | Ht 69.0 in | Wt 189.0 lb

## 2021-07-11 DIAGNOSIS — M961 Postlaminectomy syndrome, not elsewhere classified: Secondary | ICD-10-CM

## 2021-07-11 NOTE — Progress Notes (Signed)
? ?Office Visit Note ?  ?Patient: Melinda Stone           ?Date of Birth: 09-Jan-1981           ?MRN: [id number] ?Visit Date: 07/11/2021 ?             ?Requested by: Anselm Jungling, NP ?[phone] ?[place],   [id number] ?PCP: Anselm Jungling, NP ? ? ?Assessment & Plan: ?Visit Diagnoses:  ?1. Post laminectomy syndrome   ? ? ?Plan: Reviewed findings from MRI after surgery which was done on 12/02/2020.  We discussed options including fusion.  She understands she have to quit smoking.  Currently with chronic anemia work-up.  She can return in 3 months and on return visit we will obtain AP and lateral lumbar spine radiographs. ? ?Follow-Up Instructions: Return in about 3 months (around 10/10/2021).  ? ?Orders:  ?No orders of the defined types were placed in this encounter. ? ?No orders of the defined types were placed in this encounter. ? ? ? ? Procedures: ?No procedures performed ? ? ?Clinical Data: ?No additional findings. ? ? ?Subjective: ?Chief Complaint  ?Patient presents with  ? Lower Back - Pain, Follow-up  ? ? ?HPI 41 year old female returns post L4-5 microdiscectomy bilaterally and left L5-S1 microdiscectomy 1 year ago with surgery date 05/30/2020.  She had had repeat herniation with repeat microdiscectomy on 11/14/2020 left L4-5.  Ongoing problems with chronic anemia with hemoglobin between 7 and 9.4 which is being evaluated.  Additional problems with migraines.  Recent admission in March with acute coli UTI with pyelonephritis.  Additionally COPD. ? ?Review of Systems all systems noncontributory to HPI. ? ? ?Objective: ?Vital Signs: BP 110/73   Pulse (!) 118   Ht 5\' 9"  (1.753 m)   Wt 189 lb (85.7 kg)   BMI 27.91 kg/m?  ? ?Physical Exam ?Constitutional:   ?   Appearance: She is well-developed.  ?HENT:  ?   Head: Normocephalic.  ?   Right Ear: External ear normal.  ?   Left Ear: External ear normal. There is no impacted cerumen.  ?Eyes:  ?   Pupils: Pupils are equal, round, and reactive to light.  ?Neck:   ?   Thyroid: No thyromegaly.  ?   Trachea: No tracheal deviation.  ?Cardiovascular:  ?   Rate and Rhythm: Normal rate.  ?Pulmonary:  ?   Effort: Pulmonary effort is normal.  ?Abdominal:  ?   Palpations: Abdomen is soft.  ?Musculoskeletal:  ?   Cervical back: No rigidity.  ?Skin: ?   General: Skin is warm and dry.  ?Neurological:  ?   Mental Status: She is alert and oriented to person, place, and time.  ?Psychiatric:     ?   Behavior: Behavior normal.  ? ? ?Ortho Exam well-healed lumbar incision she is amatory with a short stride slow gait.  Anterior tib gastrocsoleus is active and takes good resistance bilaterally.  No pain with hip range of motion knee reaches full extension good quad strength. ? ?Specialty Comments:  ?No specialty comments available. ? ?Imaging: ?No results found. ? ? ?PMFS History: ?Patient Active Problem List  ? Diagnosis Date Noted  ? Nausea & vomiting 06/08/2021  ? UTI (urinary tract infection) 06/08/2021  ? 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 HNP (herniated nucleus pulposus), lumbar 11/14/2020  ? 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 Chronic pain syndrome 06/14/2020  ? Pharmacologic therapy 06/14/2020  ? Disorder of skeletal system 06/14/2020  ? Problems influencing  health status 06/14/2020  ? History of marijuana use 06/14/2020  ? History of illicit drug use 06/14/2020  ? Abnormal drug screen (05/25/2020) 06/14/2020  ? Marijuana use 06/14/2020  ? Abnormal MRI, lumbar spine (05/16/2020) 06/14/2020  ? 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 Lumbar lateral recess stenosis (Left: L4-5, L5-S1) 06/14/2020  ? 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) 06/14/2020  ? S/P lumbar microdiscectomy 06/07/2020  ? 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  ? GI bleed 12/01/2019  ? Muscle spasm 11/06/2019  ? Sciatica 11/06/2019  ? Low back pain 02/04/2019  ? Type 2 diabetes mellitus with diabetic polyneuropathy, without long-term current use of  insulin (H[facility]) 12/03/2018  ? Type 2 diabetes mellitus with hyperglycemia, without long-term current use of insulin ([facility]) 05/15/2018  ? ?Past Medical History:  ?Diagnosis Date  ? Anemia   ? Arthritis 08/17/2020  ? Chronic back pain   ? Diabetes mellitus without complication ([facility])   ? type 2  ? Migraine   ? Ovarian cyst   ? Pneumonia   ? Polyneuropathy   ?  ?Family History  ?Problem Relation Age of Onset  ? Diabetes Mother   ? Hypertension Mother   ? Stroke Mother   ?  ?Past Surgical History:  ?Procedure Laterality Date  ? CESAREAN SECTION    ? CHOLECYSTECTOMY  2004  ? LUMBAR LAMINECTOMY N/A 05/30/2020  ? Procedure: Lumbar five Laminectomy,  Bilateral Microdiscectomy, Left Lumbar five -Sacral one Microdiscectomy;  Surgeon: [name], MD;  Location: [facility];  Service: Orthopedics;  Laterality: N/A;  ? LUMBAR LAMINECTOMY Left 11/14/2020  ? Procedure: LEFT LUMBAR FOUR-FIVE MICRODISCECTOMY;  Surgeon: [name], MD;  Location: [facility];  Service: Orthopedics;  Laterality: Left;  ? TUBAL LIGATION  10/19/2010  ? ?Social History  ? ?Occupational History  ?  Comment: home maker  ?Tobacco Use  ? Smoking status: Some Days  ?  Types: Cigars  ? Smokeless tobacco: Never  ? Tobacco comments:  ?  black and milds-- 5 cigars per week-07/04/2021  ?Vaping Use  ? Vaping Use: Never used  ?Substance and Sexual Activity  ? Alcohol use: Not Currently  ? Drug use: Not Currently  ?  Types: Marijuana  ?  Comment: per patient stopped smoking marijuana Mar 2022  ? Sexual activity: Yes  ?  Birth control/protection: None  ? ? ? ? ? ? ?

## 2021-07-13 ENCOUNTER — Other Ambulatory Visit
Admission: RE | Admit: 2021-07-13 | Discharge: 2021-07-13 | Disposition: A | Discharge status: Home / Self Care | Payer: Medicaid Other | Source: Ambulatory Visit | Attending: Pulmonary Disease | Admitting: Pulmonary Disease

## 2021-07-13 DIAGNOSIS — R0602 Shortness of breath: Secondary | ICD-10-CM | POA: Diagnosis present

## 2021-07-13 LAB — CBC WITH DIFFERENTIAL/PLATELET
Abs Immature Granulocytes: 0.01 10*3/uL (ref 0.00–0.07)
Basophils Absolute: 0.1 10*3/uL (ref 0.0–0.1)
Basophils Relative: 1 %
Eosinophils Absolute: 0 10*3/uL (ref 0.0–0.5)
Eosinophils Relative: 0 %
HCT: 27.1 % — ABNORMAL LOW (ref 36.0–46.0)
Hemoglobin: 9.2 g/dL — ABNORMAL LOW (ref 12.0–15.0)
Immature Granulocytes: 0 %
Lymphocytes Relative: 23 %
Lymphs Abs: 1.5 10*3/uL (ref 0.7–4.0)
MCH: 25.6 pg — ABNORMAL LOW (ref 26.0–34.0)
MCHC: 33.9 g/dL (ref 30.0–36.0)
MCV: 75.3 fL — ABNORMAL LOW (ref 80.0–100.0)
Monocytes Absolute: 0.4 10*3/uL (ref 0.1–1.0)
Monocytes Relative: 6 %
Neutro Abs: 4.7 10*3/uL (ref 1.7–7.7)
Neutrophils Relative %: 70 %
Platelets: 178 10*3/uL (ref 150–400)
RBC: 3.6 MIL/uL — ABNORMAL LOW (ref 3.87–5.11)
RDW: 19 % — ABNORMAL HIGH (ref 11.5–15.5)
WBC: 6.7 10*3/uL (ref 4.0–10.5)
nRBC: 0 % (ref 0.0–0.2)

## 2021-07-13 LAB — IRON AND TIBC
Iron: 95 ug/dL (ref 28–170)
Saturation Ratios: 21 % (ref 10.4–31.8)
TIBC: 455 ug/dL — ABNORMAL HIGH (ref 250–450)
UIBC: 360 ug/dL

## 2021-07-13 LAB — FERRITIN: Ferritin: 14 ng/mL (ref 11–307)

## 2021-07-16 LAB — ALLERGEN PANEL (27) + IGE
Alternaria Alternata IgE: <0.1 kU/L
Aspergillus Fumigatus IgE: <0.1 kU/L
Bahia Grass IgE: <0.1 kU/L
Bermuda Grass IgE: <0.1 kU/L
Cat Dander IgE: <0.1 kU/L
Cedar, Mountain IgE: <0.1 kU/L
Cladosporium Herbarum IgE: <0.1 kU/L
Cocklebur IgE: 0.1 kU/L — AB
Cockroach, American IgE: 0.11 kU/L — AB
Common Silver Birch IgE: <0.1 kU/L
D Farinae IgE: 0.15 kU/L — AB
D Pteronyssinus IgE: 0.16 kU/L — AB
Dog Dander IgE: <0.1 kU/L
Elm, American IgE: <0.1 kU/L
Hickory, White IgE: <0.1 kU/L
IgE (Immunoglobulin E), Serum: 57 IU/mL (ref 6–495)
Johnson Grass IgE: <0.1 kU/L
Kentucky Bluegrass IgE: <0.1 kU/L
Maple/Box Elder IgE: <0.1 kU/L
Mucor Racemosus IgE: <0.1 kU/L
Oak, White IgE: <0.1 kU/L
Penicillium Chrysogen IgE: <0.1 kU/L
Pigweed, Rough IgE: <0.1 kU/L
Plantain, English IgE: <0.1 kU/L
Ragweed, Short IgE: <0.1 kU/L
Setomelanomma Rostrat: <0.1 kU/L
Timothy Grass IgE: <0.1 kU/L
White Mulberry IgE: <0.1 kU/L

## 2021-08-08 ENCOUNTER — Other Ambulatory Visit: Payer: Medicaid Other

## 2021-08-09 ENCOUNTER — Telehealth: Payer: Self-pay | Admitting: Radiology

## 2021-08-09 ENCOUNTER — Ambulatory Visit (INDEPENDENT_AMBULATORY_CARE_PROVIDER_SITE_OTHER): Payer: Medicaid Other | Admitting: Surgery

## 2021-08-09 ENCOUNTER — Encounter: Payer: Self-pay | Admitting: Surgery

## 2021-08-09 VITALS — BP 94/60 | HR 101

## 2021-08-09 DIAGNOSIS — M5416 Radiculopathy, lumbar region: Secondary | ICD-10-CM

## 2021-08-09 DIAGNOSIS — M961 Postlaminectomy syndrome, not elsewhere classified: Secondary | ICD-10-CM

## 2021-08-09 NOTE — Telephone Encounter (Signed)
Lmom advised that Dr. Barbaraann Faster first avail would 09/13/21 @ 10:30am to call us back if that did not work for her to reschedule with the front desk

## 2021-08-09 NOTE — Telephone Encounter (Signed)
Patient needs 2 level lumbar fusion. Dr. Lorin Mercy has spoken with Dr. Louanne Skye who agrees to see her. Could you please schedule? Thanks.

## 2021-08-09 NOTE — Progress Notes (Signed)
41 year old black female returns with complaints with back pain.  Seen by Dr. Ophelia Charter April 20 50,023 and he did mention option of doing a fusion.  She is currently in pain management on taking Percocet, gabapentin, tizanidine.  States that she was seen by them yesterday and advised to return to [place] asking if there is something that she can take for inflammation.  She is already on ibuprofen.  S/P lumbar microdiscectomy Z98.890 (ICD-10-CM)   EXAM: MRI LUMBAR SPINE WITHOUT AND WITH CONTRAST   TECHNIQUE: Multiplanar and multiecho pulse sequences of the lumbar spine were obtained without and with intravenous contrast.   CONTRAST:  48mL MULTIHANCE GADOBENATE DIMEGLUMINE 529 MG/ML IV SOLN   COMPARISON:  MRI lumbar spine May 15, 2020.   FINDINGS: Segmentation:  Standard.   Alignment:  No substantial sagittal subluxation.   Vertebrae: No evidence of acute fracture, discitis/osteomyelitis, or suspicious bone lesion. Diffuse T1 hypointensity of the marrow, is nonspecific but most likely related to the patient's known diagnosis anemia. Discogenic/degenerative endplate signal changes at L4-L5.   Conus medullaris and cauda equina: Conus extends to the L1 level. Conus appears normal.   Paraspinal and other soft tissues: Postsurgical changes in the posterior paraspinal soft tissues centered at L4-L5 with ill-defined fluid collection, measuring up to 2.7 x 1.4 cm on series 12, image 25.   Disc levels:   T12-L1: No significant disc protrusion, foraminal stenosis, or canal stenosis.   L1-L2: No significant disc protrusion, foraminal stenosis, or canal stenosis.   L2-L3: No significant disc protrusion, foraminal stenosis, or canal stenosis.   L3-L4: No significant disc protrusion, foraminal stenosis, or canal stenosis.   L4-L5: Interval left laminectomy and microdiscectomy. Postoperative edema and enhancement in the left subarticular recess lateral and posterior canal. Ill-defined  fluid along the surgical approach with fluid collection described above. Broad disc bulge with significantly decreased size of the previously seen left paracentral disc protrusion. Canal stenosis is significantly improved, now mild. Left subarticular recess stenosis is also improved   L5-S1: Moderate left paracentral disc protrusion with moderate canal stenosis and left sub articular recess effacement similar to prior. Similar mild bilateral foraminal stenosis.   IMPRESSION: 1. At L4-L5, postoperative changes with left microdiscectomy and enhancing granulation in the left lateral canal and left subarticular recess. Canal stenosis and left subarticular recess stenosis is improved. 2. At L5-S1, similar moderate left paracentral disc protrusion with moderate canal stenosis and left subarticular recess effacement. 3. Ill-defined postoperative fluid collection in the left paraspinal soft tissues at L4-L5, measuring 2.7 x 1.4 cm.     Electronically Signed   By: Feliberto Harts M.D.   On: 12/02/2020 12:56  Plan Advised patient that options are pretty limited at this point.  States that she would like to go and have paperwork started for surgery.  I will have her follow-up with Dr. Ophelia Charter in 4 weeks for recheck.  I will get him to fill out the surgery sheet

## 2021-08-30 ENCOUNTER — Ambulatory Visit: Payer: Medicaid Other | Attending: Pulmonary Disease

## 2021-08-30 DIAGNOSIS — F17200 Nicotine dependence, unspecified, uncomplicated: Secondary | ICD-10-CM | POA: Insufficient documentation

## 2021-08-30 DIAGNOSIS — R0602 Shortness of breath: Secondary | ICD-10-CM | POA: Diagnosis present

## 2021-08-30 DIAGNOSIS — R0600 Dyspnea, unspecified: Secondary | ICD-10-CM | POA: Diagnosis not present

## 2021-08-30 LAB — PULMONARY FUNCTION TEST ARMC ONLY
DL/VA % pred: 82 %
DL/VA: 3.56 ml/min/mmHg/L
DLCO unc % pred: 52 %
DLCO unc: 13.62 ml/min/mmHg
FEF 25-75 Post: 2.05 L/sec
FEF 25-75 Pre: 2.14 L/sec
FEF2575-%Change-Post: -4 %
FEF2575-%Pred-Post: 63 %
FEF2575-%Pred-Pre: 66 %
FEV1-%Change-Post: -5 %
FEV1-%Pred-Post: 73 %
FEV1-%Pred-Pre: 77 %
FEV1-Post: 2.23 L
FEV1-Pre: 2.35 L
FEV1FVC-%Change-Post: -4 %
FEV1FVC-%Pred-Pre: 93 %
FEV6-%Change-Post: -2 %
FEV6-%Pred-Post: 79 %
FEV6-%Pred-Pre: 81 %
FEV6-Post: 2.88 L
FEV6-Pre: 2.95 L
FEV6FVC-%Pred-Post: 101 %
FEV6FVC-%Pred-Pre: 101 %
FVC-%Change-Post: 0 %
FVC-%Pred-Post: 81 %
FVC-%Pred-Pre: 82 %
FVC-Post: 3 L
FVC-Pre: 3.02 L
Post FEV1/FVC ratio: 74 %
Post FEV6/FVC ratio: 100 %
Pre FEV1/FVC ratio: 78 %
Pre FEV6/FVC Ratio: 100 %
RV % pred: 79 %
RV: 1.45 L
TLC % pred: 70 %
TLC: 4.12 L

## 2021-08-30 MED ORDER — ALBUTEROL SULFATE (2.5 MG/3ML) 0.083% IN NEBU
2.5000 mg | INHALATION_SOLUTION | Freq: Once | RESPIRATORY_TRACT | Status: AC
  Administered 2021-08-30: 2.5 mg via RESPIRATORY_TRACT
  Filled 2021-08-30: qty 3

## 2021-09-01 ENCOUNTER — Ambulatory Visit (INDEPENDENT_AMBULATORY_CARE_PROVIDER_SITE_OTHER): Payer: Medicaid Other | Admitting: Primary Care

## 2021-09-01 ENCOUNTER — Encounter: Payer: Self-pay | Admitting: Primary Care

## 2021-09-01 VITALS — BP 96/58 | HR 94 | Temp 97.8°F | Ht 69.0 in | Wt 188.2 lb

## 2021-09-01 DIAGNOSIS — R0609 Other forms of dyspnea: Secondary | ICD-10-CM | POA: Diagnosis not present

## 2021-09-01 DIAGNOSIS — R911 Solitary pulmonary nodule: Secondary | ICD-10-CM | POA: Insufficient documentation

## 2021-09-01 DIAGNOSIS — R918 Other nonspecific abnormal finding of lung field: Secondary | ICD-10-CM | POA: Diagnosis not present

## 2021-09-01 DIAGNOSIS — D649 Anemia, unspecified: Secondary | ICD-10-CM | POA: Insufficient documentation

## 2021-09-01 DIAGNOSIS — Z720 Tobacco use: Secondary | ICD-10-CM | POA: Insufficient documentation

## 2021-09-01 DIAGNOSIS — R0600 Dyspnea, unspecified: Secondary | ICD-10-CM | POA: Insufficient documentation

## 2021-09-01 DIAGNOSIS — D509 Iron deficiency anemia, unspecified: Secondary | ICD-10-CM | POA: Insufficient documentation

## 2021-09-01 DIAGNOSIS — I959 Hypotension, unspecified: Secondary | ICD-10-CM | POA: Insufficient documentation

## 2021-09-01 NOTE — Progress Notes (Signed)
Agree with the details of the visit as noted by Elizabeth Walsh, NP.  C. Laura Mickala Laton, MD [facility] 

## 2021-09-01 NOTE — Patient Instructions (Addendum)
Pulmonary function not consistent with asthma Diffusion capacity is decreased likely due to anemia Inflammatory nodules on CAT scan in March we will repeat CT in September to monitor these Monitor blood pressure at home if consistently less than 90/60 please present to ED for evaluation  Recommendations You can stop Advair Use as needed albuterol 2 puffs every 4-6 hours as needed for breakthrough shortness of breath or wheezing  Orders: Re-order echocardiogram   Referral: Hematology re: iron deficiency anemia  Follow-up 3 months with Dr. Jayme Cloud to review CT

## 2021-09-01 NOTE — Assessment & Plan Note (Addendum)
-   Patient smokes 1-2 black and mild cigars daily.  Denies current marijuana use. - Encourage smoking cessation

## 2021-09-01 NOTE — Assessment & Plan Note (Signed)
-   BP today 96/58 likely due to opioid use.  Advised patient to have family member drive home today push fluids and monitor blood pressure, if consistently less than 90/60 recommend patient go to the ED for evaluation

## 2021-09-01 NOTE — Assessment & Plan Note (Signed)
-   CTA on 06/03/2021 showed clustered peripheral groundglass nodules within the left upper lobe likely due to inflammatory/infectious etiology.  We will repeat CT chest in 3 months to monitor as patient is a current smoker.

## 2021-09-01 NOTE — Assessment & Plan Note (Addendum)
-   Referring to hematology/oncology for work up and management

## 2021-09-01 NOTE — Assessment & Plan Note (Addendum)
-   Pulmonary function testing showed mild restriction with reduced DLCO. Clinical symptoms and PFTs not consistent with COPD or asthma.  She saw no benefit in breathing with trial of ICS/LABA inhaler or SABA. She is scheduled for echocardiogram on June 23.  Dyspnea along with decreased diffusion capacity felt to be related to iron deficiency anemia. We will refer her to hematology/oncology for work-up of this.  Plan discontinue Advair.  She can continue to use as needed albuterol 2 puffs every 4-6 hours as needed for breakthrough shortness of breath or wheezing.  Follow-up in 3 months after repeat CT chest.

## 2021-09-01 NOTE — Progress Notes (Signed)
_0  ID: Melinda Stone, female    DOB: [birth year], 41 y.o.   MRN: [id number]  Chief Complaint  Patient presents with   Follow-up    Review Echo and PFT--SOB with exertion.     Referring provider: Lauretta Grill, NP  HPI: 41 year old female, some day smoker.  Past medical history significant for type 2 diabetes, chronic pain syndrome, history of drug use.  Patient of Dr. Patsey Berthold, seen for initial consult on 07/04/2021 for shortness of breath.  09/01/2021 Patient presents today for 6 to 8-week follow-up.  She was felt to have mild intermittent asthma due to wheezing noted on exam.  Labs revealed chronic anemia and some mild allergies to dust mites.  Trelegy inhaler was denied by her insurance, preferred meds are Advair, Dulera and Symbicort. She was started on Advair 250/50 1 puff twice daily.   Main complaint is shortness of breath. This has been going on for the last 2-3 months. No improvement with either Advair or prn albuterol. She developed a cough in November 2022 that went away in March 2023. She gets tired and out of breath doing simple tasks such as shower, making her bed or doing the laundry. She needs to use a motor scooter while shopping. She smokes black and mild cigars 1-2 times a day. Blood pressure is on lower side today, she did take 1 Percocet tablet this morning. She has no active cough, chest tightness or wheezing.   Pulmonary function testing 08/30/21 FVC 3.00 (81%), FEV1 2.23 (73%), ratio 74, TLC 70, DLCOunc 52%   No Known Allergies  Immunization History  Administered Date(s) Administered   Janssen (J&J) SARS-COV-2 Vaccination 08/25/2019   Pneumococcal Polysaccharide-23 05/09/2020    Past Medical History:  Diagnosis Date   Anemia    Arthritis 08/17/2020   Chronic back pain    Diabetes mellitus without complication (HCC)    type 2   Migraine    Ovarian cyst    Pneumonia    Polyneuropathy     Tobacco History: Social History   Tobacco Use   Smoking Status Some Days   Types: Cigars  Smokeless Tobacco Never  Tobacco Comments   black and milds-- 5 cigars per week-09/01/2021   Ready to quit: Not Answered Counseling given: Not Answered Tobacco comments: black and milds-- 5 cigars per week-09/01/2021   Outpatient Medications Prior to Visit  Medication Sig Dispense Refill   Blood Glucose Monitoring Suppl (ACCU-CHEK GUIDE ME) w/Device KIT Use as instructed to check blood sugar daily 1 kit 0   chlorhexidine (PERIDEX) 0.12 % solution Use as directed 5 mLs in the mouth or throat 2 (two) times daily.     Dulaglutide (TRULICITY) 1.5 IH/4.7QQ SOPN Inject 1.5 mg into the skin once a week. 6 mL 3   fluticasone-salmeterol (ADVAIR DISKUS) 250-50 MCG/ACT AEPB Inhale 1 puff into the lungs in the morning and at bedtime. 60 each 5   gabapentin (NEURONTIN) 800 MG tablet Take 800 mg by mouth in the morning, at noon, in the evening, and at bedtime.     glipiZIDE (GLUCOTROL) 10 MG tablet Take 2 tablets (20 mg total) by mouth 2 (two) times daily before a meal. 360 tablet 3   glucose blood (ACCU-CHEK GUIDE) test strip Use as instructed to check blood sugar 2 times daily 100 each 11   ondansetron (ZOFRAN) 4 MG tablet Take 1 tablet (4 mg total) by mouth every 6 (six) hours as needed for nausea. 20 tablet 0   oxyCODONE-acetaminophen (PERCOCET/ROXICET) 5-325  MG tablet Take 1 tablet by mouth every 6 (six) hours as needed for moderate pain or severe pain.     pantoprazole (PROTONIX) 40 MG tablet Take 1 tablet (40 mg total) by mouth daily. 30 tablet 0   rizatriptan (MAXALT-MLT) 10 MG disintegrating tablet Take 1 tablet (10 mg total) by mouth as needed for migraine. May repeat in 2 hours if needed 9 tablet 11   tiZANidine (ZANAFLEX) 4 MG tablet Take 4 mg by mouth in the morning, at noon, in the evening, and at bedtime.     topiramate (TOPAMAX) 50 MG tablet Take 1 tablet (50 mg total) by mouth 2 (two) times daily. 60 tablet 12   VENTOLIN HFA 108 (90 Base) MCG/ACT  inhaler 2 puffs every 6 (six) hours as needed for wheezing or shortness of breath.     No facility-administered medications prior to visit.   Review of Systems  Review of Systems  Constitutional:  Positive for fatigue.  HENT: Negative.    Respiratory:  Positive for shortness of breath. Negative for cough, chest tightness and wheezing.   Cardiovascular: Negative.    Physical Exam  BP (!) 96/58 (BP Location: Right Arm, Cuff Size: Normal)   Pulse 94   Temp 97.8 F (36.6 C) (Temporal)   Ht _0  (1.753 m)   Wt 188 lb 3.2 oz (85.4 kg)   SpO2 100%   BMI 27.79 kg/m  Physical Exam Constitutional:      Appearance: Normal appearance.  HENT:     Head: Normocephalic and atraumatic.     Mouth/Throat:     Mouth: Mucous membranes are moist.     Pharynx: Oropharynx is clear.  Cardiovascular:     Rate and Rhythm: Normal rate and regular rhythm.     Comments: No lower extremity edema Pulmonary:     Effort: Pulmonary effort is normal.     Breath sounds: Normal breath sounds. No wheezing, rhonchi or rales.     Comments: No overt wheezing  Musculoskeletal:     Comments: In transport wheelchair due to leg pain  Skin:    General: Skin is warm and dry.  Neurological:     General: No focal deficit present.     Mental Status: She is alert and oriented to person, place, and time. Mental status is at baseline.     Comments: Alert and oriented. Answers all questions appropriately.  Psychiatric:        Mood and Affect: Mood normal.        Thought Content: Thought content normal.     Comments: Appears slightly dazed during her office visit today       Lab Results:  CBC    Component Value Date/Time   WBC 6.7 07/13/2021 1142   RBC 3.60 (L) 07/13/2021 1142   HGB 9.2 (L) 07/13/2021 1142   HGB 9.4 (L) 01/29/2019 1744   HCT 27.1 (L) 07/13/2021 1142   HCT 28.5 (L) 01/29/2019 1744   PLT 178 07/13/2021 1142   PLT 203 01/29/2019 1744   MCV 75.3 (L) 07/13/2021 1142   MCV 74 (L) 01/29/2019  1744   MCH 25.6 (L) 07/13/2021 1142   MCHC 33.9 07/13/2021 1142   RDW 19.0 (H) 07/13/2021 1142   RDW 19.0 (H) 01/29/2019 1744   LYMPHSABS 1.5 07/13/2021 1142   LYMPHSABS 2.0 10/20/2018 1002   MONOABS 0.4 07/13/2021 1142   EOSABS 0.0 07/13/2021 1142   EOSABS 0.1 10/20/2018 1002   BASOSABS 0.1 07/13/2021 1142   BASOSABS  0.1 10/20/2018 1002    BMET    Component Value Date/Time   NA 136 06/12/2021 1810   NA 136 04/28/2020 1124   K 4.1 06/12/2021 1810   CL 103 06/12/2021 1810   CO2 20 (L) 06/12/2021 1810   GLUCOSE 310 (H) 06/12/2021 1810   BUN 5 (L) 06/12/2021 1810   BUN 4 (L) 04/28/2020 1124   CREATININE 0.67 06/12/2021 1810   CALCIUM 8.4 (L) 06/12/2021 1810   GFRNONAA >60 06/12/2021 1810   GFRAA 139 04/28/2020 1124    BNP No results found for: "BNP"  ProBNP No results found for: "PROBNP"  Imaging: Pulmonary Function Test [facility] Only  Result Date: 08/30/2021 Spirometry Data Is Acceptable and Reproducible No obvious evidence of Obstructive Airways disease or Restrictive Lung disease Consider outpatient follow up with Pulmonary if needed. Clinical Correlation Advised     Assessment & Plan:   Dyspnea - Pulmonary function testing showed mild restriction with reduced DLCO. Clinical symptoms and PFTs not consistent with COPD or asthma.  She saw no benefit in breathing with trial of ICS/LABA inhaler or SABA. She is scheduled for echocardiogram on June 23.  Dyspnea along with decreased diffusion capacity felt to be related to iron deficiency anemia. We will refer her to hematology/oncology for work-up of this.  Plan discontinue Advair.  She can continue to use as needed albuterol 2 puffs every 4-6 hours as needed for breakthrough shortness of breath or wheezing.  Follow-up in 3 months after repeat CT chest.  Pulmonary nodules - CTA on 06/03/2021 showed clustered peripheral groundglass nodules within the left upper lobe likely due to inflammatory/infectious etiology.  We will repeat  CT chest in 3 months to monitor as patient is a current smoker.  Iron deficiency anemia - Referring to hematology/oncology for work up and management  Tobacco use - Patient smokes 1-2 black and mild cigars daily.  Denies current marijuana use. - Encourage smoking cessation  Hypotension - BP today 96/58 likely due to opioid use.  Advised patient to have family member drive home today push fluids and monitor blood pressure, if consistently less than 90/60 recommend patient go to the ED for evaluation   Martyn Ehrich, NP 09/01/2021

## 2021-09-08 ENCOUNTER — Ambulatory Visit (INDEPENDENT_AMBULATORY_CARE_PROVIDER_SITE_OTHER): Payer: Medicaid Other

## 2021-09-08 DIAGNOSIS — R0602 Shortness of breath: Secondary | ICD-10-CM

## 2021-09-08 LAB — ECHOCARDIOGRAM COMPLETE
AR max vel: 2.9 cm2
AV Area VTI: 3.17 cm2
AV Area mean vel: 2.75 cm2
AV Mean grad: 3 mmHg
AV Peak grad: 5.7 mmHg
Ao pk vel: 1.19 m/s
Calc EF: 56.9 %
S' Lateral: 2.5 cm
Single Plane A2C EF: 54.2 %
Single Plane A4C EF: 59.4 %

## 2021-09-11 ENCOUNTER — Inpatient Hospital Stay: Payer: Medicaid Other

## 2021-09-11 ENCOUNTER — Encounter: Payer: Self-pay | Admitting: Oncology

## 2021-09-11 ENCOUNTER — Inpatient Hospital Stay: Payer: Medicaid Other | Attending: Oncology | Admitting: Oncology

## 2021-09-11 VITALS — BP 90/60 | HR 99 | Temp 97.3°F | Resp 16 | Ht 69.0 in | Wt 185.4 lb

## 2021-09-11 DIAGNOSIS — E538 Deficiency of other specified B group vitamins: Secondary | ICD-10-CM | POA: Insufficient documentation

## 2021-09-11 DIAGNOSIS — D509 Iron deficiency anemia, unspecified: Secondary | ICD-10-CM | POA: Insufficient documentation

## 2021-09-11 DIAGNOSIS — F17290 Nicotine dependence, other tobacco product, uncomplicated: Secondary | ICD-10-CM | POA: Insufficient documentation

## 2021-09-11 DIAGNOSIS — E1142 Type 2 diabetes mellitus with diabetic polyneuropathy: Secondary | ICD-10-CM | POA: Diagnosis not present

## 2021-09-11 LAB — RETIC PANEL
Immature Retic Fract: 11.9 % (ref 2.3–15.9)
RBC.: 3.4 MIL/uL — ABNORMAL LOW (ref 3.87–5.11)
Reticulocyte Hemoglobin: 31.9 pg (ref >27.9)

## 2021-09-11 LAB — COMPREHENSIVE METABOLIC PANEL
ALT: 10 U/L (ref 0–44)
AST: 14 U/L — ABNORMAL LOW (ref 15–41)
Albumin: 3.9 g/dL (ref 3.5–5.0)
Alkaline Phosphatase: 86 U/L (ref 38–126)
Anion gap: 6 (ref 5–15)
BUN: 9 mg/dL (ref 6–20)
CO2: 20 mmol/L — ABNORMAL LOW (ref 22–32)
Calcium: 8.5 mg/dL — ABNORMAL LOW (ref 8.9–10.3)
Chloride: 109 mmol/L (ref 98–111)
Creatinine, Ser: 0.66 mg/dL (ref 0.44–1.00)
GFR, Estimated: >60 mL/min (ref >60)
Glucose, Bld: 199 mg/dL — ABNORMAL HIGH (ref 70–99)
Potassium: 4.1 mmol/L (ref 3.5–5.1)
Sodium: 135 mmol/L (ref 135–145)
Total Bilirubin: 1.4 mg/dL — ABNORMAL HIGH (ref 0.3–1.2)
Total Protein: 6.9 g/dL (ref 6.5–8.1)

## 2021-09-11 LAB — FERRITIN: Ferritin: 15 ng/mL (ref 11–307)

## 2021-09-11 LAB — CBC WITH DIFFERENTIAL/PLATELET
Abs Immature Granulocytes: 0.04 10*3/uL (ref 0.00–0.07)
Basophils Absolute: 0.1 10*3/uL (ref 0.0–0.1)
Basophils Relative: 1 %
Eosinophils Absolute: 0.2 10*3/uL (ref 0.0–0.5)
Eosinophils Relative: 2 %
HCT: 28.9 % — ABNORMAL LOW (ref 36.0–46.0)
Hemoglobin: 10.1 g/dL — ABNORMAL LOW (ref 12.0–15.0)
Immature Granulocytes: 0 %
Lymphocytes Relative: 21 %
Lymphs Abs: 1.9 10*3/uL (ref 0.7–4.0)
MCH: 27.6 pg (ref 26.0–34.0)
MCHC: 34.9 g/dL (ref 30.0–36.0)
MCV: 79 fL — ABNORMAL LOW (ref 80.0–100.0)
Monocytes Absolute: 0.5 10*3/uL (ref 0.1–1.0)
Monocytes Relative: 5 %
Neutro Abs: 6.5 10*3/uL (ref 1.7–7.7)
Neutrophils Relative %: 71 %
Platelets: 191 10*3/uL (ref 150–400)
RBC: 3.66 MIL/uL — ABNORMAL LOW (ref 3.87–5.11)
RDW: 21.4 % — ABNORMAL HIGH (ref 11.5–15.5)
WBC: 9.2 10*3/uL (ref 4.0–10.5)
nRBC: 0 % (ref 0.0–0.2)

## 2021-09-11 LAB — TECHNOLOGIST SMEAR REVIEW
Plt Morphology: NORMAL
RBC MORPHOLOGY: NORMAL
WBC MORPHOLOGY: NORMAL

## 2021-09-11 LAB — LACTATE DEHYDROGENASE: LDH: 151 U/L (ref 98–192)

## 2021-09-11 LAB — TSH: TSH: 0.657 u[IU]/mL (ref 0.350–4.500)

## 2021-09-11 LAB — VITAMIN B12: Vitamin B-12: 224 pg/mL (ref 180–914)

## 2021-09-11 LAB — FOLATE: Folate: 7.3 ng/mL (ref >5.9)

## 2021-09-11 NOTE — Progress Notes (Signed)
Hematology/Oncology Consult note [phone] [phone]         Patient Care Team: Anselm Jungling, NP as PCP - General (Nurse Practitioner)  REFERRING PROVIDER: Glenford Bayley, NP  CHIEF COMPLAINTS/REASON FOR VISIT:  Evaluation of iron deficiency anemia  HISTORY OF PRESENTING ILLNESS:   Melinda Stone is a  41 y.o.  female with PMH listed below was seen in consultation at the request of  Glenford Bayley, NP  for evaluation of iron deficiency anemia  Patient had blood work done on 07/13/2021, CBC showed hemoglobin of 9.0, MCV 75.3, ferritin 14, iron saturation 21, TIBC 455.  Reviewed her previous lab records.  Patient has chronic anemia since at least 2019.  + Nausea, no vomiting.  Chronic diarrhea after each meal.  Unintentional weight loss Patient reports that she has had colonoscopy done in May which did not reveal etiology of diarrhea.  Colonoscopy record was not available to me at the time of dictation. 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  Review of Systems  Constitutional:  Positive for appetite change, fatigue and unexpected weight change. Negative for chills and fever.  HENT:   Negative for hearing loss and voice change.   Eyes:  Negative for eye problems.  Respiratory:  Negative for chest tightness and cough.   Cardiovascular:  Negative for chest pain.  Gastrointestinal:  Positive for diarrhea and nausea. Negative for abdominal distention, abdominal pain, blood in stool and vomiting.  Endocrine: Negative for hot flashes.  Genitourinary:  Negative for difficulty urinating and frequency.   Musculoskeletal:  Positive for back pain. Negative for arthralgias.  Skin:  Negative for itching and rash.  Neurological:  Positive for numbness. Negative for extremity weakness.  Hematological:  Negative for adenopathy.  Psychiatric/Behavioral:  Negative for confusion.     MEDICAL HISTORY:  Past  Medical History:  Diagnosis Date   Anemia    Arthritis 08/17/2020   Chronic back pain    Diabetes mellitus without complication (HCC)    type 2   Migraine    Ovarian cyst    Pneumonia    Polyneuropathy     SURGICAL HISTORY: Past Surgical History:  Procedure Laterality Date   CESAREAN SECTION     CHOLECYSTECTOMY  2004   LUMBAR LAMINECTOMY N/A 05/30/2020   Procedure: Lumbar five Laminectomy,  Bilateral Microdiscectomy, Left Lumbar five -Sacral one Microdiscectomy;  Surgeon: [name], MD;  Location: [facility] OR;  Service: Orthopedics;  Laterality: N/A;   LUMBAR LAMINECTOMY Left 11/14/2020   Procedure: LEFT LUMBAR FOUR-FIVE MICRODISCECTOMY;  Surgeon: [name], MD;  Location: [facility] OR;  Service: Orthopedics;  Laterality: Left;   TUBAL LIGATION  10/19/2010    SOCIAL HISTORY: Social History   Socioeconomic History   Marital status: Single    Spouse name: Not on file   Number of children: 3   Years of education: Not on file   Highest education level: High school graduate  Occupational History    Comment: home maker  Tobacco Use   Smoking status: Some Days    Types: Cigars   Smokeless tobacco: Never   Tobacco comments:    black and milds-- 5 cigars per week-09/01/2021  Vaping Use   Vaping Use: Never used  Substance and Sexual Activity   Alcohol use: Not Currently   Drug use: Not Currently    Types: Marijuana    Comment: per patient stopped smoking marijuana Mar 2022   Sexual activity: Yes  Birth control/protection: None  Other Topics Concern   Not on file  Social History Narrative   Lives with 3 children   Some coffee   Social Determinants of Health   Financial Resource Strain: Not on file  Food Insecurity: Not on file  Transportation Needs: Not on file  Physical Activity: Not on file  Stress: Not on file  Social Connections: Not on file  Intimate Partner Violence: Not on file    FAMILY HISTORY: Family History  Problem Relation Age of Onset   Diabetes  Mother    Hypertension Mother    Stroke Mother     ALLERGIES:  has No Known Allergies.  MEDICATIONS:  Current Outpatient Medications  Medication Sig Dispense Refill   Blood Glucose Monitoring Suppl (ACCU-CHEK GUIDE ME) w/Device KIT Use as instructed to check blood sugar daily 1 kit 0   chlorhexidine (PERIDEX) 0.12 % solution Use as directed 5 mLs in the mouth or throat 2 (two) times daily.     Dulaglutide (TRULICITY) 1.5 MG/0.5ML SOPN Inject 1.5 mg into the skin once a week. 6 mL 3   fluticasone-salmeterol (ADVAIR DISKUS) 250-50 MCG/ACT AEPB Inhale 1 puff into the lungs in the morning and at bedtime. 60 each 5   gabapentin (NEURONTIN) 800 MG tablet Take 800 mg by mouth in the morning, at noon, in the evening, and at bedtime.     glipiZIDE (GLUCOTROL) 10 MG tablet Take 2 tablets (20 mg total) by mouth 2 (two) times daily before a meal. 360 tablet 3   glucose blood (ACCU-CHEK GUIDE) test strip Use as instructed to check blood sugar 2 times daily 100 each 11   ondansetron (ZOFRAN) 4 MG tablet Take 1 tablet (4 mg total) by mouth every 6 (six) hours as needed for nausea. 20 tablet 0   oxyCODONE-acetaminophen (PERCOCET/ROXICET) 5-325 MG tablet Take 1 tablet by mouth every 6 (six) hours as needed for moderate pain or severe pain.     pantoprazole (PROTONIX) 40 MG tablet Take 1 tablet (40 mg total) by mouth daily. 30 tablet 0   rizatriptan (MAXALT-MLT) 10 MG disintegrating tablet Take 1 tablet (10 mg total) by mouth as needed for migraine. May repeat in 2 hours if needed 9 tablet 11   tiZANidine (ZANAFLEX) 4 MG tablet Take 4 mg by mouth in the morning, at noon, in the evening, and at bedtime.     topiramate (TOPAMAX) 50 MG tablet Take 1 tablet (50 mg total) by mouth 2 (two) times daily. 60 tablet 12   VENTOLIN HFA 108 (90 Base) MCG/ACT inhaler 2 puffs every 6 (six) hours as needed for wheezing or shortness of breath.     No current facility-administered medications for this visit.     PHYSICAL  EXAMINATION: ECOG PERFORMANCE STATUS: 2 - Symptomatic, <50% confined to bed Vitals:   09/11/21 1055  BP: 90/60  Pulse: 99  Resp: 16  Temp: (!) 97.3 F (36.3 C)  SpO2: 100%   Filed Weights   09/11/21 1055  Weight: 185 lb 6.4 oz (84.1 kg)      Physical Exam Constitutional:      General: She is not in acute distress.    Comments: Patient sits in the wheelchair  HENT:     Head: Normocephalic and atraumatic.  Eyes:     General: No scleral icterus. Cardiovascular:     Rate and Rhythm: Normal rate and regular rhythm.     Heart sounds: Normal heart sounds.  Pulmonary:     Effort:  Pulmonary effort is normal. No respiratory distress.     Breath sounds: No wheezing.  Abdominal:     General: Bowel sounds are normal. There is no distension.     Palpations: Abdomen is soft.  Musculoskeletal:        General: No deformity. Normal range of motion.     Cervical back: Normal range of motion and neck supple.  Skin:    General: Skin is warm and dry.  Neurological:     Mental Status: She is alert and oriented to person, place, and time. Mental status is at baseline.     Cranial Nerves: No cranial nerve deficit.     Coordination: Coordination normal.  Psychiatric:        Mood and Affect: Mood normal.     LABORATORY DATA:  I have reviewed the data as listed Lab Results  Component Value Date   WBC 9.2 09/11/2021   HGB 10.1 (L) 09/11/2021   HCT 28.9 (L) 09/11/2021   MCV 79.0 (L) 09/11/2021   PLT 191 09/11/2021   Recent Labs    06/11/21 0411 06/12/21 1810 09/11/21 1140  NA 137 136 135  K 3.3* 4.1 4.1  CL 109 103 109  CO2 22 20* 20*  GLUCOSE 182* 310* 199*  BUN <5* 5* 9  CREATININE 0.59 0.67 0.66  CALCIUM 8.5* 8.4* 8.5*  GFRNONAA >60 >60 >60  PROT 5.7* 7.7 6.9  ALBUMIN 3.0* 3.9 3.9  AST 15 31 14*  ALT 13 16 10   ALKPHOS 105 124 86  BILITOT 0.9 1.3* 1.4*   Iron/TIBC/Ferritin/ %Sat    Component Value Date/Time   IRON 95 07/13/2021 1142   IRON 64 01/02/2019 0955    TIBC 455 (H) 07/13/2021 1142   TIBC 382 01/02/2019 0955   FERRITIN 15 09/11/2021 1140   FERRITIN 21 01/02/2019 0955   IRONPCTSAT 21 07/13/2021 1142   IRONPCTSAT 17 01/02/2019 0955      RADIOGRAPHIC STUDIES: I have personally reviewed the radiological images as listed and agreed with the findings in the report. ECHOCARDIOGRAM COMPLETE  Result Date: 09/08/2021    ECHOCARDIOGRAM REPORT   Patient Name:   Melinda Stone [place] Date of Exam: 09/08/2021 Medical Rec #:  [id number]              Height:       69.0 in Accession #:    [id number]             Weight:       188.2 lb Date of Birth:  February 12, 1981              BSA:          2.013 m Patient Age:    41 years               BP:           100/58 mmHg Patient Gender: F                      HR:           96 bpm. Exam Location:  [facility] Procedure: 2D Echo, Cardiac Doppler, Color Doppler and Strain Analysis Indications:    R06.02 SOB  History:        Patient has no prior history of Echocardiogram examinations.                 Signs/Symptoms:Dyspnea; Risk Factors:Current Smoker, Diabetes  and Hypotension.  Sonographer:    Quentin Ore RDMS, RVT, RDCS Referring Phys: [id number] CARMEN L GONZALEZ IMPRESSIONS  1. Left ventricular ejection fraction, by estimation, is 60 to 65%. The left ventricle has normal function. The left ventricle has no regional wall motion abnormalities. There is mild left ventricular hypertrophy. Left ventricular diastolic parameters are consistent with Grade I diastolic dysfunction (impaired relaxation). The average left ventricular global longitudinal strain is -14.3 %. The global longitudinal strain is normal.  2. Right ventricular systolic function is normal. The right ventricular size is normal. There is normal pulmonary artery systolic pressure. The estimated right ventricular systolic pressure is 24.5 mmHg.  3. The mitral valve is normal in structure. No evidence of mitral valve regurgitation. No evidence of mitral stenosis.  4.  Tricuspid valve regurgitation is mild to moderate.  5. The aortic valve is tricuspid. Aortic valve regurgitation is not visualized. No aortic stenosis is present.  6. The inferior vena cava is normal in size with greater than 50% respiratory variability, suggesting right atrial pressure of 3 mmHg. FINDINGS  Left Ventricle: Left ventricular ejection fraction, by estimation, is 60 to 65%. The left ventricle has normal function. The left ventricle has no regional wall motion abnormalities. The average left ventricular global longitudinal strain is -14.3 %. The global longitudinal strain is normal. The left ventricular internal cavity size was normal in size. There is mild left ventricular hypertrophy. Left ventricular diastolic parameters are consistent with Grade I diastolic dysfunction (impaired relaxation). Right Ventricle: The right ventricular size is normal. No increase in right ventricular wall thickness. Right ventricular systolic function is normal. There is normal pulmonary artery systolic pressure. The tricuspid regurgitant velocity is 2.21 m/s, and  with an assumed right atrial pressure of 5 mmHg, the estimated right ventricular systolic pressure is 24.5 mmHg. Left Atrium: Left atrial size was normal in size. Right Atrium: Right atrial size was normal in size. Pericardium: There is no evidence of pericardial effusion. Mitral Valve: The mitral valve is normal in structure. No evidence of mitral valve regurgitation. No evidence of mitral valve stenosis. Tricuspid Valve: The tricuspid valve is normal in structure. Tricuspid valve regurgitation is mild to moderate. No evidence of tricuspid stenosis. Aortic Valve: The aortic valve is tricuspid. Aortic valve regurgitation is not visualized. No aortic stenosis is present. Aortic valve mean gradient measures 3.0 mmHg. Aortic valve peak gradient measures 5.7 mmHg. Aortic valve area, by VTI measures 3.17 cm. Pulmonic Valve: The pulmonic valve was normal in  structure. Pulmonic valve regurgitation is not visualized. No evidence of pulmonic stenosis. Aorta: The aortic root is normal in size and structure. Ascending aorta measurements are within normal limits for age when indexed to body surface area. Venous: The inferior vena cava is normal in size with greater than 50% respiratory variability, suggesting right atrial pressure of 3 mmHg. IAS/Shunts: No atrial level shunt detected by color flow Doppler.  LEFT VENTRICLE PLAX 2D LVIDd:         3.90 cm     Diastology LVIDs:         2.50 cm     LV e' medial:  5.66 cm/s LV PW:         1.00 cm     LV e' lateral: 13.10 cm/s LV IVS:        1.20 cm LVOT diam:     2.00 cm     2D Longitudinal Strain LV SV:         73  2D Strain GLS Avg:     -14.3 % LV SV Index:   36 LVOT Area:     3.14 cm  LV Volumes (MOD) LV vol d, MOD A2C: 84.3 ml LV vol d, MOD A4C: 92.9 ml LV vol s, MOD A2C: 38.6 ml LV vol s, MOD A4C: 37.7 ml LV SV MOD A2C:     45.7 ml LV SV MOD A4C:     92.9 ml LV SV MOD BP:      50.8 ml RIGHT VENTRICLE             IVC RV Basal diam:  3.10 cm     IVC diam: 1.50 cm RV S prime:     11.00 cm/s RVOT diam:      2.85 cm TAPSE (M-mode): 2.6 cm LEFT ATRIUM             Index        RIGHT ATRIUM           Index LA diam:        3.40 cm 1.69 cm/m   RA Area:     13.40 cm LA Vol (A2C):   61.7 ml 30.65 ml/m  RA Volume:   30.10 ml  14.95 ml/m LA Vol (A4C):   40.1 ml 19.92 ml/m LA Biplane Vol: 50.2 ml 24.93 ml/m  AORTIC VALVE                    PULMONIC VALVE AV Area (Vmax):    2.90 cm     PV Vmax:       0.79 m/s AV Area (Vmean):   2.75 cm     PV Peak grad:  2.5 mmHg AV Area (VTI):     3.17 cm AV Vmax:           119.00 cm/s AV Vmean:          82.200 cm/s AV VTI:            0.230 m AV Peak Grad:      5.7 mmHg AV Mean Grad:      3.0 mmHg LVOT Vmax:         110.00 cm/s LVOT Vmean:        72.000 cm/s LVOT VTI:          0.232 m LVOT/AV VTI ratio: 1.01  AORTA Ao Root diam: 3.00 cm Ao Asc diam:  2.70 cm Ao Arch diam: 2.7 cm TRICUSPID  VALVE TR Peak grad:   19.5 mmHg TR Vmax:        221.00 cm/s  SHUNTS Systemic VTI:  0.23 m Systemic Diam: 2.00 cm Pulmonic Diam: 2.85 cm Julien Nordmann MD Electronically signed by Julien Nordmann MD Signature Date/Time: 09/08/2021/2:02:17 PM    Final    Pulmonary Function Test [facility] Only  Result Date: 08/30/2021 Spirometry Data Is Acceptable and Reproducible No obvious evidence of Obstructive Airways disease or Restrictive Lung disease Consider outpatient follow up with Pulmonary if needed. Clinical Correlation Advised      ASSESSMENT & PLAN:  1. Microcytic anemia   2. Low serum vitamin B12    Recommend to check CBC, CMP, multiple myeloma panel, light chain ratio, LDH, TSH, smear, ferritin, iron TIBC, reticulocyte panel. Discussed about possibility of IV iron treatment.  Rationale and potential side effects were reviewed and discussed with her.  She agrees with IV Venofer treatments if needed.  Today CBC showed improved hemoglobin to 10.1, MCV 79, low normal end vitamin B12  level, normal TSH, normal LDH, ferritin is 15.  Iron TIBC pending.  Multiple myeloma panel is pending.  I recommend patient to start on vitamin B12 supplementation 1000 mcg daily.  Follow-up TBD Orders Placed This Encounter  Procedures   Vitamin B12    Standing Status:   Future    Number of Occurrences:   1    Standing Expiration Date:   09/12/2022   Folate    Standing Status:   Future    Number of Occurrences:   1    Standing Expiration Date:   09/12/2022   Ferritin    Standing Status:   Future    Number of Occurrences:   1    Standing Expiration Date:   03/13/2022   Technologist smear review    Standing Status:   Future    Number of Occurrences:   1    Standing Expiration Date:   09/12/2022   Comprehensive metabolic panel    Standing Status:   Future    Number of Occurrences:   1    Standing Expiration Date:   09/12/2022   CBC with Differential/Platelet    Standing Status:   Future    Number of Occurrences:   1     Standing Expiration Date:   09/12/2022   Retic Panel    Standing Status:   Future    Number of Occurrences:   1    Standing Expiration Date:   09/12/2022   Multiple Myeloma Panel (SPEP&IFE w/QIG)    Standing Status:   Future    Number of Occurrences:   1    Standing Expiration Date:   09/12/2022   Kappa/lambda light chains    Standing Status:   Future    Number of Occurrences:   1    Standing Expiration Date:   09/12/2022   Lactate dehydrogenase    Standing Status:   Future    Number of Occurrences:   1    Standing Expiration Date:   09/12/2022   TSH    Standing Status:   Future    Number of Occurrences:   1    Standing Expiration Date:   09/12/2022    All questions were answered. The patient knows to call the clinic with any problems questions or concerns.  cc Glenford Bayley, NP    Thank you for this kind referral and the opportunity to participate in the care of this patient. A copy of today's note is routed to referring provider   Rickard Patience, MD, PhD [facility] Hematology Oncology 09/11/2021

## 2021-09-12 ENCOUNTER — Other Ambulatory Visit: Payer: Self-pay

## 2021-09-12 ENCOUNTER — Ambulatory Visit (INDEPENDENT_AMBULATORY_CARE_PROVIDER_SITE_OTHER): Payer: Medicaid Other | Admitting: Orthopaedic Surgery

## 2021-09-12 VITALS — BP 99/68 | HR 98 | Ht 69.0 in | Wt 185.4 lb

## 2021-09-12 DIAGNOSIS — Z9889 Other specified postprocedural states: Secondary | ICD-10-CM | POA: Diagnosis not present

## 2021-09-12 DIAGNOSIS — D508 Other iron deficiency anemias: Secondary | ICD-10-CM

## 2021-09-12 DIAGNOSIS — M5126 Other intervertebral disc displacement, lumbar region: Secondary | ICD-10-CM | POA: Diagnosis not present

## 2021-09-12 LAB — IRON AND TIBC
Iron: 72 ug/dL (ref 28–170)
Saturation Ratios: 16 % (ref 10.4–31.8)
TIBC: 441 ug/dL (ref 250–450)
UIBC: 369 ug/dL

## 2021-09-12 LAB — KAPPA/LAMBDA LIGHT CHAINS
Kappa free light chain: 34.4 mg/L — ABNORMAL HIGH (ref 3.3–19.4)
Kappa, lambda light chain ratio: 1.4 (ref 0.26–1.65)
Lambda free light chains: 24.6 mg/L (ref 5.7–26.3)

## 2021-09-13 ENCOUNTER — Ambulatory Visit (INDEPENDENT_AMBULATORY_CARE_PROVIDER_SITE_OTHER): Payer: Medicaid Other | Admitting: Specialist

## 2021-09-13 ENCOUNTER — Ambulatory Visit: Payer: Self-pay

## 2021-09-13 ENCOUNTER — Encounter: Payer: Self-pay | Admitting: Specialist

## 2021-09-13 VITALS — BP 101/70 | HR 99 | Ht 69.0 in | Wt 185.5 lb

## 2021-09-13 DIAGNOSIS — Z9889 Other specified postprocedural states: Secondary | ICD-10-CM

## 2021-09-13 DIAGNOSIS — M961 Postlaminectomy syndrome, not elsewhere classified: Secondary | ICD-10-CM | POA: Diagnosis not present

## 2021-09-13 DIAGNOSIS — M5416 Radiculopathy, lumbar region: Secondary | ICD-10-CM

## 2021-09-13 DIAGNOSIS — M5136 Other intervertebral disc degeneration, lumbar region: Secondary | ICD-10-CM

## 2021-09-13 DIAGNOSIS — M5126 Other intervertebral disc displacement, lumbar region: Secondary | ICD-10-CM

## 2021-09-13 DIAGNOSIS — M4316 Spondylolisthesis, lumbar region: Secondary | ICD-10-CM

## 2021-09-13 NOTE — Patient Instructions (Signed)
Plan: Avoid frequent bending and stooping  No lifting greater than 10 lbs. May use ice or moist heat for pain. Weight loss is of benefit. MRI lumbar spine with and without contrast to assess for recurrent HNP as pain changed in 04/2021 Likely she will need a lumbar fusion at L4-5 and L5-S1. Exercise is important to improve your indurance and does allow people to function better inspite of back pain.

## 2021-09-13 NOTE — Progress Notes (Addendum)
Office Visit Note   Patient: Melinda Stone           Date of Birth: 1980-10-28           MRN: [id number] Visit Date: 09/13/2021              Requested by: Anselm Jungling, NP [address] [address] [place],  [place] [address] PCP: Anselm Jungling, NP   Assessment & Plan: Visit Diagnoses:  1. S/P lumbar microdiscectomy   2. Recurrent herniation of lumbar disc   3. Radiculopathy, lumbar region   41 year old female with over one year of back pain and left and right leg weakness, seen in 04/2020 with difficulty standing and walking, MRI showed large HNP L4-5 and Left L5-S1 HNP, had surgery 05/30/2020 and then a redo laminectomy 10/2020. She improved with both surgeries and then had recurrent pain into the back and left leg greater than right. Last MRI was 11/2020 post redo microdiscectomy Left L4-5. Pain improved then worsened and she report the pain is even more since 04/2021. Exam with weakness left leg L5 and L4 distribution but also S1, no neural tension. She has neurogenic pain complaints with lack of neural tension this could be neuropathic pain, last MRI did show increased modic changes at L4-5 and L5-S1. Plain radiographs show accelerated DDD with widening of the L4-5 disc posteriorly with bending suggesting abnormal rotational motion at the L4-5 disc and mild spondylolisthesis grade 1. I recommend repeating her MRI as her pain worsened in 04/2021 and she has required more narcotics. The MRI will help in the decision to perform decompression surgery at L4-5 or L5-S1 and in the assessment of progression of degeneration at these level. 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 Surgical fusion may improve her back pain but pain due to nerve changes for previous compression and surgical change may not be completely relieved. With diabetes neural  injury can be microvascular. She is in pain management and recieveing adequate meds. Taking oxycodone 3x a day it appears that she is trying to cope with the discomfort and limit her use of narcotics. I will call her with the results of MRI and decision regarding surgical treatment of her spine with the information the study provides concerning need for decompression vs fusion alone.   Plan: Avoid frequent bending and stooping  No lifting greater than 10 lbs. May use ice or moist heat for pain. Weight loss is of benefit. MRI lumbar spine with and without contrast to assess for recurrent HNP as pain changed in 04/2021 Likely she will need a lumbar fusion at L4-5 and L5-S1. Exercise is important to improve your indurance and does allow people to function better inspite of back pain.    Follow-Up Instructions: No follow-ups on file.   Orders:  Orders Placed This Encounter  Procedures   XR Lumbar Spine 2-3 Views   No orders of the defined types were placed in this encounter.     Procedures: No procedures performed   Clinical Data: Findings:  Narrative & Impression CLINICAL DATA:  Chronic bilateral low back pain with bilateral sciatica.   EXAM: MRI LUMBAR SPINE WITHOUT CONTRAST   TECHNIQUE: Multiplanar, multisequence MR imaging of the lumbar spine was performed. No intravenous contrast was administered.   COMPARISON:  Plain films March 21, 2020.   FINDINGS: Segmentation:  Standard.  Alignment:  Physiologic.   Vertebrae:  No fracture, evidence of discitis, or bone lesion.   Conus medullaris and cauda equina: Conus extends to the L1 level. Conus and cauda equina appear normal.   Paraspinal and other soft tissues: Negative.   Disc levels:   T12-L1:No spinal canal or neural foraminal stenosis.   L1-2:No spinal canal or neural foraminal stenosis.   L2-3:No spinal canal or neural foraminal stenosis.   L3-4:No spinal canal or neural foraminal stenosis.   L4-5:Loss  of disc height, disc bulge with superimposed large left central disc extrusion migrating inferiorly, causing compression on the thecal sac with severe spinal canal stenosis and effacement of the left subarticular zone. The disc extrusion measures approximately 2.2 x 1.5 x 1.1 cm (cc, T, AP). Mild facet degenerative changes. Mild bilateral neural foraminal narrowing.   L5-S1:Left central disc protrusion causing compression on the thecal sac, resulting in moderate spinal canal stenosis with effacement of the left subarticular zone and mild narrowing of the right subarticular zone. Mild facet degenerative changes. No neural foraminal stenosis.   IMPRESSION: 1. 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 2. Left central disc protrusion at L5-S1 causing moderate spinal canal stenosis with effacement of the left subarticular zone and mild narrowing of the right subarticular zone.   These results will be called to the ordering clinician or representative by the Radiologist Assistant, and communication documented in the PACS or [employer].     Electronically Signed   By: Pedro Earls M.D.   On: 04/21/2020 19:33    Narrative & Impression CLINICAL DATA:  Chronic low back pain with radiculopathy   EXAM: MRI LUMBAR SPINE WITHOUT CONTRAST   TECHNIQUE: Multiplanar, multisequence MR imaging of the lumbar spine was performed. No intravenous contrast was administered.   COMPARISON:  MRI 04/21/2020   FINDINGS: Segmentation:  Standard.   Alignment:  Physiologic.   Vertebrae:  No fracture, evidence of discitis, or bone lesion.   Conus medullaris and cauda equina: Conus extends to the L1 level. Conus and cauda equina appear normal.   Paraspinal and other soft tissues: Retroverted uterus. 1.5 cm anterior uterine body fibroid. Trace free fluid within the cul-de-sac, likely  physiologic. Otherwise negative.   Disc levels:   T12-L1: Negative.   L1-L2: Negative.   L2-L3: Negative.   L3-L4: Negative.   L4-L5: Large left paracentral disc extrusion with 2.3 cm caudal extension resulting in severe canal stenosis with effacement of the left subarticular recess. Mild bilateral foraminal narrowing. Findings unchanged from prior.   L5-S1: Moderate left paracentral disc protrusion resulting in moderate canal stenosis and left subarticular recess effacement. Foramina remain patent. Findings unchanged from prior.   IMPRESSION: 1. Large left paracentral disc extrusion at L4-L5 resulting in severe canal stenosis and effacement of the left subarticular recess. 2. Moderate left paracentral disc protrusion at L5-S1 resulting in moderate canal stenosis and left subarticular recess effacement.     Electronically Signed   By: Davina Poke D.O.   On: 05/16/2020 10:32    Narrative & Impression CLINICAL DATA:  Initial evaluation for low back pain with radiation into the lower extremities, prior surgery on 05/30/2020. Evaluate for recurrent HNP.   EXAM: MRI LUMBAR SPINE WITHOUT AND WITH CONTRAST   TECHNIQUE: Multiplanar and multiecho pulse sequences of the lumbar spine were obtained without and with intravenous contrast.   CONTRAST:  45mL MULTIHANCE GADOBENATE DIMEGLUMINE 529 MG/ML IV SOLN  COMPARISON:  MRI from 05/15/2020.   FINDINGS: Segmentation: Standard. Lowest well-formed disc space labeled the L5-S1 level.   Alignment: Slight focal kyphotic angulation at the L4-5 level. Alignment otherwise normal with preservation of the normal lumbar lordosis.   Vertebrae: Vertebral body height maintained without acute or interval fracture. Bone marrow signal intensity within normal limits. No discrete or worrisome osseous lesions. Mild reactive endplate change present about the L4-5 interspace. No other abnormal marrow edema or enhancement.   Conus  medullaris and cauda equina: Conus extends to the L1 level. Conus and cauda equina appear normal.   Paraspinal and other soft tissues: Postoperative changes from prior laminectomy with micro discectomy at L4-5. No adverse features. Paraspinous soft tissues demonstrate no other acute finding. Visualized visceral structures unremarkable.   Disc levels:   L1-2:  Unremarkable.   L2-3:  Unremarkable.   L3-4:  Unremarkable.   L4-5: Degenerative intervertebral disc space narrowing with disc desiccation and diffuse disc bulge. Postoperative changes from prior decompressive laminectomy with micro discectomy. There is a residual and/or recurrent left subarticular disc protrusion extending into the left lateral recess (series 9, image 29). Resultant moderate canal with severe left lateral recess stenosis. Mild bilateral L4 foraminal narrowing.   L5-S1: Degenerative intervertebral disc space narrowing with disc desiccation and mild disc bulge. Sequelae of prior left hemi laminectomy with micro discectomy. Postoperative granulation tissue partially surrounds the descending left S1 nerve root. Small residual and/or recurrent left subarticular to foraminal disc protrusion (series 8, image 12). Disc material closely approximates both the left L5 and descending S1 nerve roots without frank impingement. No significant canal or lateral recess stenosis. Mild bilateral L5 foraminal stenosis.   IMPRESSION: 1. Postoperative changes from prior decompressive laminectomy with micro discectomy at L4-5. Residual and/or recurrent left subarticular disc protrusion at this level with resultant moderate canal and severe left lateral recess stenosis. 2. Sequelae of prior left hemi laminectomy with micro discectomy at L5-S1 with small residual and/or recurrent left subarticular to foraminal disc protrusion as above.     Electronically Signed   By: Jeannine Boga M.D.   On: 10/02/2020 19:56     CLINICAL DATA:  Radiculopathy, lumbar region M54.16 (ICD-10-CM)   S/P lumbar microdiscectomy Z98.890 (ICD-10-CM)   EXAM: MRI LUMBAR SPINE WITHOUT AND WITH CONTRAST   TECHNIQUE: Multiplanar and multiecho pulse sequences of the lumbar spine were obtained without and with intravenous contrast.   CONTRAST:  26mL MULTIHANCE GADOBENATE DIMEGLUMINE 529 MG/ML IV SOLN   COMPARISON:  MRI lumbar spine May 15, 2020.   FINDINGS: Segmentation:  Standard.   Alignment:  No substantial sagittal subluxation.   Vertebrae: No evidence of acute fracture, discitis/osteomyelitis, or suspicious bone lesion. Diffuse T1 hypointensity of the marrow, is nonspecific but most likely related to the patient's known diagnosis anemia. Discogenic/degenerative endplate signal changes at L4-L5.   Conus medullaris and cauda equina: Conus extends to the L1 level. Conus appears normal.   Paraspinal and other soft tissues: Postsurgical changes in the posterior paraspinal soft tissues centered at L4-L5 with ill-defined fluid collection, measuring up to 2.7 x 1.4 cm on series 12, image 25.   Disc levels:   T12-L1: No significant disc protrusion, foraminal stenosis, or canal stenosis.   L1-L2: No significant disc protrusion, foraminal stenosis, or canal stenosis.   L2-L3: No significant disc protrusion, foraminal stenosis, or canal stenosis.   L3-L4: No significant disc protrusion, foraminal stenosis, or canal stenosis.   L4-L5: Interval left laminectomy and microdiscectomy. Postoperative edema and enhancement in  the left subarticular recess lateral and posterior canal. Ill-defined fluid along the surgical approach with fluid collection described above. Broad disc bulge with significantly decreased size of the previously seen left paracentral disc protrusion. Canal stenosis is significantly improved, now mild. Left subarticular recess stenosis is also improved   L5-S1: Moderate left paracentral  disc protrusion with moderate canal stenosis and left sub articular recess effacement similar to prior. Similar mild bilateral foraminal stenosis.   IMPRESSION: 1. At L4-L5, postoperative changes with left microdiscectomy and enhancing granulation in the left lateral canal and left subarticular recess. Canal stenosis and left subarticular recess stenosis is improved. 2. At L5-S1, similar moderate left paracentral disc protrusion with moderate canal stenosis and left subarticular recess effacement. 3. Ill-defined postoperative fluid collection in the left paraspinal soft tissues at L4-L5, measuring 2.7 x 1.4 cm.     Electronically Signed   By: Margaretha Sheffield M.D.   On: 12/02/2020 12:56     Subjective: Chief Complaint  Patient presents with   Lower Back - Pain    41 year old female with history of large disc herniation L4-5 centrally and left L5-S1 underwent bilateral L4-5 microdiscectomy and left L5-S1 microdiscectomy  05/30/2020. Returned to the OR for recurrent HNP in 10/2020 with left L4-5 microdiscectomy with removal of another large extruded fragment. Following the second surgery the pain improved for a little while and she was able to come off meds then [birth year] her birthday 05/09/2019 the pain has worsened. She has pain that is constant and she is taking oxycodone 5 mg every morning and in the afternoon and at night (3 x per day). The pain is worse with standing or sitting for a long period of time. There is left buttock pain. No bowel or bladder difficulty. The legs are weak, they burn, they tingle and they are numb. She is in a Pain management program with [facility], Dr. Jena Gauss, Dr. Gorden Harms. She is taking oxycodone and gabapentin. Past medical history is significant for diabetes. She describes pain as being whole left leg front and back but mainly over the left anterolateral calf, L4 and L5 distribution.   Review of Systems  Constitutional: Negative.    HENT: Negative.    Eyes: Negative.   Respiratory: Negative.    Cardiovascular: Negative.   Gastrointestinal: Negative.   Endocrine: Negative.   Genitourinary: Negative.   Musculoskeletal:  Positive for back pain and gait problem.  Skin: Negative.   Allergic/Immunologic: Negative.   Neurological:  Positive for weakness and numbness.  Hematological: Negative.   Psychiatric/Behavioral: Negative.      Objective: Vital Signs: BP 101/70 (BP Location: Left Arm, Patient Position: Sitting)   Pulse 99   Ht 5\' 9"  (1.753 m)   Wt 185 lb 8 oz (84.1 kg)   BMI 27.39 kg/m   Physical Exam Musculoskeletal:     Lumbar back: Negative right straight leg raise test and negative left straight leg raise test.   Back Exam   Tenderness  The patient is experiencing tenderness in the lumbar.  Range of Motion  Extension:  abnormal  Flexion:  abnormal  Lateral bend right:  abnormal  Lateral bend left:  abnormal  Rotation right:  abnormal  Rotation left:  abnormal   Muscle Strength  Right Quadriceps:  5/5  Left Quadriceps:  4/5  Right Hamstrings:  5/5  Left Hamstrings:  4/5   Tests  Straight leg raise right: negative Straight leg raise left: negative  Reflexes  Patellar:  0/4 Achilles:  0/4  Other  Toe walk: abnormal Heel walk: abnormal Sensation: decreased Gait: drop-foot  Erythema: no back redness Scars: present  Comments:  Weak left EHL 4-/5, weak left foot DF 4/5, weak right EHL 5-/5 and right foot DF 5-/5 She is sitting on exam table with left leg flat on table and with the left leg brought to full SLR, no neural tension. Reviewing her previous office notes it is not clear that a neural tension sign was present prior to or following her previous surgeries.     Specialty Comments:  No specialty comments available.  Imaging: No results found.   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   Chronic pain syndrome 06/14/2020   Pharmacologic therapy 06/14/2020   Disorder of skeletal system 06/14/2020   Problems influencing health status 06/14/2020   History of marijuana use 06/14/2020   History of illicit drug use 06/14/2020   Abnormal drug screen (05/25/2020) 06/14/2020   Marijuana use 06/14/2020   Abnormal MRI, lumbar spine (05/16/2020) 06/14/2020   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, L5-S1) 06/14/2020   Lumbar lateral recess stenosis (Left: L4-5, L5-S1) 06/14/2020   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) 06/14/2020   S/P lumbar microdiscectomy 06/07/2020   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   GI bleed 12/01/2019   Muscle spasm 11/06/2019   Sciatica 11/06/2019   Low back pain 02/04/2019   Type 2 diabetes mellitus with diabetic polyneuropathy, without long-term current use of insulin (HCC) 12/03/2018   Type 2 diabetes mellitus with hyperglycemia, without long-term current use of insulin (HCC) 05/15/2018   Past Medical History:  Diagnosis Date   Anemia    Arthritis 08/17/2020   Chronic back pain    Diabetes mellitus without complication (HCC)    type 2   Migraine    Ovarian cyst    Pneumonia    Polyneuropathy     Family History  Problem Relation Age of Onset   Diabetes Mother    Hypertension Mother    Stroke Mother     Past Surgical History:  Procedure Laterality Date   CESAREAN SECTION     CHOLECYSTECTOMY  2004   LUMBAR LAMINECTOMY N/A 05/30/2020   Procedure: Lumbar five Laminectomy,  Bilateral Microdiscectomy, Left Lumbar five -Sacral one Microdiscectomy;  Surgeon: [name], MD;  Location: [facility] OR;  Service:  Orthopedics;  Laterality: N/A;   LUMBAR LAMINECTOMY Left 11/14/2020   Procedure: LEFT LUMBAR FOUR-FIVE MICRODISCECTOMY;  Surgeon: [name], MD;  Location: [facility] OR;  Service: Orthopedics;  Laterality: Left;   TUBAL LIGATION  10/19/2010   Social History   Occupational History    Comment: home maker  Tobacco Use   Smoking status: Some Days    Types: Cigars   Smokeless tobacco: Never   Tobacco comments:    black and milds-- 5 cigars per week-09/01/2021  Vaping Use   Vaping Use: Never used  Substance and Sexual Activity   Alcohol use: Not Currently   Drug use: Not Currently    Types: Marijuana    Comment: per patient stopped smoking marijuana Mar 2022   Sexual  activity: Yes    Birth control/protection: None

## 2021-09-14 LAB — MULTIPLE MYELOMA PANEL, SERUM
Albumin SerPl Elph-Mcnc: 3.7 g/dL (ref 2.9–4.4)
Albumin/Glob SerPl: 1.5 (ref 0.7–1.7)
Alpha 1: 0.2 g/dL (ref 0.0–0.4)
Alpha2 Glob SerPl Elph-Mcnc: 0.4 g/dL (ref 0.4–1.0)
B-Globulin SerPl Elph-Mcnc: 0.9 g/dL (ref 0.7–1.3)
Gamma Glob SerPl Elph-Mcnc: 1.1 g/dL (ref 0.4–1.8)
Globulin, Total: 2.6 g/dL (ref 2.2–3.9)
IgA: 301 mg/dL (ref 87–352)
IgG (Immunoglobin G), Serum: 1113 mg/dL (ref 586–1602)
IgM (Immunoglobulin M), Srm: 91 mg/dL (ref 26–217)
Total Protein ELP: 6.3 g/dL (ref 6.0–8.5)

## 2021-09-16 ENCOUNTER — Other Ambulatory Visit: Payer: Self-pay | Admitting: Oncology

## 2021-09-18 ENCOUNTER — Telehealth: Payer: Self-pay

## 2021-09-18 NOTE — Telephone Encounter (Signed)
-----   Message from Rickard Patience, MD sent at 09/16/2021 10:13 PM EDT ----- Labs are reviewed. Her B12 level is borderline low. Recommend her to start B12 daily - otc supply.  Iron panel is also at low normal end. I recommend IV venofer weekly x 3.if she agrees, please arrange.  Follow up appt : labs in 4 months prior to MD + Venofer. Please order iron labs,+ b12

## 2021-09-18 NOTE — Telephone Encounter (Signed)
Called and left detailed message in reference to patient labs results and Dr. Bethanne Ginger recommendation to start B12 1000 mcg daily- otc. Also advised to receive venofer weekly x 3. Informed patient to call back with any questions or concerns.    [place], please schedule patient for: IV venofer weekly x 3 Labs in 4 months MD/Venofer 2 days after.  Please inform patient of appt. Thanks

## 2021-09-21 ENCOUNTER — Other Ambulatory Visit: Payer: Self-pay

## 2021-09-21 DIAGNOSIS — R0602 Shortness of breath: Secondary | ICD-10-CM

## 2021-09-22 ENCOUNTER — Emergency Department (HOSPITAL_COMMUNITY)
Admission: EM | Admit: 2021-09-22 | Discharge: 2021-09-22 | Disposition: A | Discharge status: Home / Self Care | Payer: Medicaid Other | Attending: Emergency Medicine | Admitting: Emergency Medicine

## 2021-09-22 ENCOUNTER — Emergency Department (HOSPITAL_COMMUNITY): Payer: Medicaid Other

## 2021-09-22 ENCOUNTER — Other Ambulatory Visit: Payer: Self-pay

## 2021-09-22 ENCOUNTER — Encounter (HOSPITAL_COMMUNITY): Payer: Self-pay

## 2021-09-22 DIAGNOSIS — M25512 Pain in left shoulder: Secondary | ICD-10-CM | POA: Insufficient documentation

## 2021-09-22 MED ORDER — NAPROXEN 500 MG PO TABS: 500.0000 mg | ORAL_TABLET | Freq: Two times a day (BID) | ORAL | 0 refills | Status: AC

## 2021-09-22 MED ORDER — DIAZEPAM 5 MG PO TABS
5.0000 mg | ORAL_TABLET | Freq: Once | ORAL | Status: AC
  Administered 2021-09-22: 5 mg via ORAL
  Filled 2021-09-22: qty 1

## 2021-09-22 MED ORDER — KETOROLAC TROMETHAMINE 30 MG/ML IJ SOLN
30.0000 mg | Freq: Once | INTRAMUSCULAR | Status: AC
  Administered 2021-09-22: 30 mg via INTRAMUSCULAR
  Filled 2021-09-22: qty 1

## 2021-09-22 NOTE — ED Notes (Signed)
Pt states understanding of dc instructions, importance of follow up, and prescription. Pt denies questions or concerns upon dc. Pt assisted to ed lobby via wheelchair w/ ed tech. No belongings left in room upon dc.

## 2021-09-22 NOTE — Discharge Instructions (Addendum)
I have provided a prescription for anti-inflammatories to help with your pain, please take 1 tablet twice a day with food for the next 7 days.  In addition you may continue taking your muscle relaxer which you have at home.  You may also add some topical therapy such as lidocaine, IcyHot patches to help with pain.

## 2021-09-22 NOTE — ED Notes (Signed)
Previous IV charted as removed from prior visit

## 2021-09-22 NOTE — ED Triage Notes (Signed)
Left neck pain radiating to left shoulder x3 days. Pt states unable to raise arm without pain. Worse with palpation. Ice and heat with no relief.  Tylenol, ibuprofen, oxycodone, and muscle relaxer @ home with no relief

## 2021-09-22 NOTE — ED Provider Notes (Signed)
[facility] DEPT Provider Note   CSN: [id number] Arrival date & time: 09/22/21  0404     History  Chief Complaint  Patient presents with   Shoulder Pain    Melinda Stone is a 41 y.o. female.  41 y.o female with a PMH DM, Migraine, Polyneuropathy presents to the ED with a chief complaint of left shoulder pain which has been going for the past 3 days.  Patient describes a cramping sensation and aching every time she ranges the left shoulder.  Reports she has issues with her lumbar spine, has a prior microdiscectomy according to records.  She has tried Tylenol, ice, ibuprofen without any improvement in her symptoms.  Patient is currently taking muscle relaxers, along with gabapentin for her neuropathy.  There is no alleviating factors.  No fevers, no trauma, no rashes.  The history is provided by the patient and medical records.  Shoulder Pain Location:  Shoulder Shoulder location:  L shoulder Injury: no   Pain details:    Quality:  Cramping and aching   Radiates to:  Does not radiate   Severity:  Mild   Onset quality:  Gradual   Duration:  3 days   Timing:  Constant   Progression:  Worsening Handedness:  Right-handed Dislocation: no   Foreign body present:  No foreign bodies Prior injury to area:  No Relieved by:  Nothing Worsened by:  Movement and stretching area Ineffective treatments:  Acetaminophen, rest, muscle relaxant, NSAIDs, immobilization, ice and heat Associated symptoms: back pain   Associated symptoms: no fever, no numbness and no stiffness   Risk factors: no concern for non-accidental trauma and no frequent fractures        Home Medications Prior to Admission medications   Medication Sig Start Date End Date Taking? Authorizing Provider  naproxen (NAPROSYN) 500 MG tablet Take 1 tablet (500 mg total) by mouth 2 (two) times daily for 7 days. 09/22/21 09/29/21 Yes Auston Halfmann, Beverley Fiedler, PA-C  Blood Glucose Monitoring Suppl (ACCU-CHEK  GUIDE ME) w/Device KIT Use as instructed to check blood sugar daily 07/13/20   Shamleffer, Melanie Crazier, MD  chlorhexidine (PERIDEX) 0.12 % solution Use as directed 5 mLs in the mouth or throat 2 (two) times daily. 06/02/21   [provider]  Dulaglutide (TRULICITY) 1.5 AQ/7.6AU SOPN Inject 1.5 mg into the skin once a week. 04/12/21   Shamleffer, Melanie Crazier, MD  fluticasone-salmeterol (ADVAIR DISKUS) 250-50 MCG/ACT AEPB Inhale 1 puff into the lungs in the morning and at bedtime. 07/10/21   Tyler Pita, MD  gabapentin (NEURONTIN) 800 MG tablet Take 800 mg by mouth in the morning, at noon, in the evening, and at bedtime. 04/04/21   [provider]  glipiZIDE (GLUCOTROL) 10 MG tablet Take 2 tablets (20 mg total) by mouth 2 (two) times daily before a meal. 04/12/21   Shamleffer, Melanie Crazier, MD  glucose blood (ACCU-CHEK GUIDE) test strip Use as instructed to check blood sugar 2 times daily 07/13/20   Shamleffer, Melanie Crazier, MD  ondansetron (ZOFRAN) 4 MG tablet Take 1 tablet (4 mg total) by mouth every 6 (six) hours as needed for nausea. 06/11/21   Georgette Shell, MD  oxyCODONE-acetaminophen (PERCOCET/ROXICET) 5-325 MG tablet Take 1 tablet by mouth every 6 (six) hours as needed for moderate pain or severe pain. 03/13/21   [provider]  pantoprazole (PROTONIX) 40 MG tablet Take 1 tablet (40 mg total) by mouth daily. 06/12/21   Georgette Shell, MD  rizatriptan (MAXALT-MLT) 10 MG disintegrating tablet Take 1 tablet (10 mg total) by mouth as needed for migraine. May repeat in 2 hours if needed 06/20/21   Penumalli, Earlean Polka, MD  tiZANidine (ZANAFLEX) 4 MG tablet Take 4 mg by mouth in the morning, at noon, in the evening, and at bedtime. 05/18/21   [provider]  topiramate (TOPAMAX) 50 MG tablet Take 1 tablet (50 mg total) by mouth 2 (two) times daily. 06/20/21   Penumalli, Earlean Polka, MD  VENTOLIN HFA 108 (90 Base) MCG/ACT inhaler 2 puffs every 6 (six)  hours as needed for wheezing or shortness of breath. 06/04/21   [provider]      Allergies    Patient has no known allergies.    Review of Systems   Review of Systems  Constitutional:  Negative for fever.  HENT:  Negative for sore throat.   Respiratory:  Negative for shortness of breath.   Cardiovascular:  Negative for chest pain.  Gastrointestinal:  Negative for abdominal pain, nausea and vomiting.  Genitourinary:  Negative for flank pain.  Musculoskeletal:  Positive for arthralgias, back pain and myalgias. Negative for stiffness.  Skin:  Negative for pallor and wound.  Neurological:  Negative for light-headedness and headaches.  All other systems reviewed and are negative.   Physical Exam Updated Vital Signs BP 124/82   Pulse 87   Temp 98.3 F (36.8 C)   Resp 18   SpO2 100%  Physical Exam Vitals and nursing note reviewed.  Constitutional:      Appearance: Normal appearance.  HENT:     Head: Normocephalic and atraumatic.     Mouth/Throat:     Mouth: Mucous membranes are moist.  Eyes:     Pupils: Pupils are equal, round, and reactive to light.  Cardiovascular:     Rate and Rhythm: Normal rate.  Pulmonary:     Effort: Pulmonary effort is normal.     Breath sounds: No wheezing.  Abdominal:     General: Abdomen is flat.  Musculoskeletal:        General: Tenderness present.     Left shoulder: Tenderness present. No swelling, deformity, effusion or crepitus. Normal range of motion. Normal strength. Normal pulse.     Cervical back: Normal range of motion and neck supple.     Comments: Full range of motion of the left shoulder with discomfort.  Pain with palpation along the trapezius muscle.  No visible rashes, no bruising to suggest trauma. BLUE strength 5/5 with flexion and extension.   Skin:    General: Skin is warm and dry.  Neurological:     Mental Status: She is alert and oriented to person, place, and time.     ED Results / Procedures / Treatments    Labs (all labs ordered are listed, but only abnormal results are displayed) Labs Reviewed - No data to display  EKG None  Radiology DG Shoulder Left  Result Date: 09/22/2021 CLINICAL DATA:  pain moderate to severe left shoulder pain of the waking up this a.m. No known trauma EXAM: LEFT SHOULDER - 2+ VIEW COMPARISON:  None Available. FINDINGS: There is no evidence of fracture or dislocation. There is no evidence of arthropathy or other focal bone abnormality. Soft tissues are unremarkable. IMPRESSION: Negative. Electronically Signed   By: Frazier Richards M.D.   On: 09/22/2021 07:35    Procedures Procedures    Medications Ordered in ED Medications  ketorolac (TORADOL) 30 MG/ML injection 30 mg (30 mg  Intramuscular Given 09/22/21 0659)  diazepam (VALIUM) tablet 5 mg (5 mg Oral Given 09/22/21 8478)    ED Course/ Medical Decision Making/ A&P                           Medical Decision Making Amount and/or Complexity of Data Reviewed Radiology: ordered.  Risk Prescription drug management.   Patient here with left shoulder pain, neck pain that is been ongoing for the past 3 days.  Exacerbated with any type of movement of the left arm, palpable reproducible along the trapezius muscle.  Without any chest pain, no shortness of breath.  Stable vital signs.  On exam the rash is present to suggest infection.  No trauma, no bruising or hematoma noted.  Has good full range of motion of the left shoulder with extension and flexion.  Pain exacerbated with turning of her neck.  No meningeal signs.  No chest pain.  X-ray did not show any subluxation or dislocation.  Given Valium, Toradol to help with symptomatic treatment.  She does have a prescription for muscle relaxers at home which I suggest that she continue.  Suspect primarily MSK origin, we did discuss conservative therapy along with follow-up with PCP as needed.  She is agreeable to plan and treatment, patient stable for  discharge.    Portions of this note were generated with [employer]. Dictation errors may occur despite best attempts at proofreading.   Final Clinical Impression(s) / ED Diagnoses Final diagnoses:  Acute pain of left shoulder    Rx / DC Orders ED Discharge Orders          Ordered    naproxen (NAPROSYN) 500 MG tablet  2 times daily        09/22/21 0757              Janeece Fitting, PA-C 09/22/21 Fithian, April, MD 09/23/21 4128

## 2021-09-24 ENCOUNTER — Ambulatory Visit
Admission: RE | Admit: 2021-09-24 | Discharge: 2021-09-24 | Disposition: A | Discharge status: Home / Self Care | Payer: Medicaid Other | Source: Ambulatory Visit | Attending: Specialist | Admitting: Specialist

## 2021-09-24 DIAGNOSIS — M5126 Other intervertebral disc displacement, lumbar region: Secondary | ICD-10-CM

## 2021-09-24 DIAGNOSIS — M5416 Radiculopathy, lumbar region: Secondary | ICD-10-CM

## 2021-09-24 MED ORDER — GADOBENATE DIMEGLUMINE 529 MG/ML IV SOLN
17.0000 mL | Freq: Once | INTRAVENOUS | Status: AC | PRN
  Administered 2021-09-24: 17 mL via INTRAVENOUS

## 2021-09-25 ENCOUNTER — Inpatient Hospital Stay: Payer: Medicaid Other | Attending: Oncology

## 2021-09-25 VITALS — BP 116/78 | HR 90 | Resp 18

## 2021-09-25 DIAGNOSIS — D508 Other iron deficiency anemias: Secondary | ICD-10-CM

## 2021-09-25 DIAGNOSIS — D509 Iron deficiency anemia, unspecified: Secondary | ICD-10-CM | POA: Insufficient documentation

## 2021-09-25 MED ORDER — SODIUM CHLORIDE 0.9 % IV SOLN
200.0000 mg | Freq: Once | INTRAVENOUS | Status: AC
  Administered 2021-09-25: 200 mg via INTRAVENOUS
  Filled 2021-09-25: qty 10

## 2021-09-25 MED ORDER — SODIUM CHLORIDE 0.9 % IV SOLN
Freq: Once | INTRAVENOUS | Status: AC
  Filled 2021-09-25: qty 250

## 2021-09-29 ENCOUNTER — Telehealth: Payer: Self-pay

## 2021-09-29 DIAGNOSIS — Z9189 Other specified personal risk factors, not elsewhere classified: Secondary | ICD-10-CM

## 2021-09-29 NOTE — Telephone Encounter (Signed)
ONO reviewed by Dr. Fredrich Romans formal sleep study due to desats with artifacts.  Patient is aware of results and voiced her understanding.  She agrees with sleep study.  Order has been placed. Nothing further needed.

## 2021-10-02 ENCOUNTER — Inpatient Hospital Stay: Payer: Medicaid Other

## 2021-10-02 VITALS — BP 100/63 | HR 78 | Temp 96.5°F | Resp 18

## 2021-10-02 DIAGNOSIS — D509 Iron deficiency anemia, unspecified: Secondary | ICD-10-CM | POA: Diagnosis not present

## 2021-10-02 DIAGNOSIS — D508 Other iron deficiency anemias: Secondary | ICD-10-CM

## 2021-10-02 MED ORDER — SODIUM CHLORIDE 0.9 % IV SOLN
200.0000 mg | Freq: Once | INTRAVENOUS | Status: AC
  Administered 2021-10-02: 200 mg via INTRAVENOUS
  Filled 2021-10-02: qty 10

## 2021-10-02 MED ORDER — SODIUM CHLORIDE 0.9 % IV SOLN
Freq: Once | INTRAVENOUS | Status: AC
  Filled 2021-10-02: qty 250

## 2021-10-06 MED FILL — Iron Sucrose Inj 20 MG/ML (Fe Equiv): INTRAVENOUS | Qty: 10 | Status: AC

## 2021-10-09 ENCOUNTER — Inpatient Hospital Stay: Payer: Medicaid Other

## 2021-10-09 VITALS — BP 99/61 | HR 89 | Temp 97.5°F | Resp 18

## 2021-10-09 DIAGNOSIS — D508 Other iron deficiency anemias: Secondary | ICD-10-CM

## 2021-10-09 DIAGNOSIS — D509 Iron deficiency anemia, unspecified: Secondary | ICD-10-CM | POA: Diagnosis not present

## 2021-10-09 MED ORDER — SODIUM CHLORIDE 0.9 % IV SOLN
200.0000 mg | Freq: Once | INTRAVENOUS | Status: AC
  Administered 2021-10-09: 200 mg via INTRAVENOUS
  Filled 2021-10-09: qty 200

## 2021-10-09 MED ORDER — SODIUM CHLORIDE 0.9 % IV SOLN
Freq: Once | INTRAVENOUS | Status: AC
  Filled 2021-10-09: qty 250

## 2021-10-10 ENCOUNTER — Ambulatory Visit: Payer: Medicaid Other | Admitting: Orthopaedic Surgery

## 2021-10-10 NOTE — Progress Notes (Unsigned)
Name: Melinda Stone  Age/ Sex: 41 y.o., female   MRN/ DOB: [id number], Mar 19, 1981     PCP: Lauretta Grill, NP   Reason for Endocrinology Evaluation: Type 2 Diabetes Mellitus  Initial Endocrine Consultative Visit: 12/03/2018    PATIENT IDENTIFIER: [name] is a 40 y.o. female with a past medical history of T2DM. The patient has followed with Endocrinology clinic since 12/03/2018 for consultative assistance with management of her diabetes.  DIABETIC HISTORY:  [name] was diagnosed with DMat age 44. Metformin had caused GI issues and hypoglycemia. Her hemoglobin A1c has ranged from 7.0% in 04/2018, peaking at 7.8% in 10/2018  On her initial visit to our clinic she had an A1c of 7.8%. She was on Jardiance, she had a prescription for Trulicity and Jardiance were too costly for her. We added glipizide .   Attempted to prescribe Trulicity in 04/98 but by 09/2020 she has not received it.  Started Jardiance 09/2020   Works at [employer] different shifts  Lives with kids 72 and 48 yrs of age   SUBJECTIVE:   During the last visit (04/12/2021): A1c 7.6 % . Continued Glipizide and started Jardiance      Today (10/10/2021): [name] is here for a follow up on diabetes.  She checks her blood sugars 1 times in the past month. The patient has not had hypoglycemic episodes since the last clinic visit.     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     HOME DIABETES REGIMEN:  - Glipizide 10 mg 2 tabs BID  - Trulcity 1.5 mg weekly    METER DOWNLOAD SUMMARY: unable to download  90 day average 191 mg/dL    DIABETIC COMPLICATIONS: Microvascular complications:   B/L retinopathy , S/P injections  Denies: CKD, neuropathy  Last eye exam: Completed 08/2020   Macrovascular  complications:    Denies: CAD, PVD, CVA   HISTORY:  Past Medical History:  Past Medical History:  Diagnosis Date   Anemia    Arthritis 08/17/2020   Chronic back pain    Diabetes mellitus without complication ([place])    type 2   Migraine    Ovarian cyst    Pneumonia    Polyneuropathy    Past Surgical History:  Past Surgical History:  Procedure Laterality Date   CESAREAN SECTION     CHOLECYSTECTOMY  2004   LUMBAR LAMINECTOMY N/A 05/30/2020   Procedure: Lumbar five Laminectomy,  Bilateral Microdiscectomy, Left Lumbar five -Sacral one Microdiscectomy;  Surgeon: Marybelle Killings, MD;  Location: [facility];  Service: Orthopedics;  Laterality: N/A;   LUMBAR LAMINECTOMY Left 11/14/2020   Procedure: LEFT LUMBAR FOUR-FIVE MICRODISCECTOMY;  Surgeon: Marybelle Killings, MD;  Location: [facility];  Service: Orthopedics;  Laterality: Left;   TUBAL LIGATION  10/19/2010   Social History:  reports that she has been smoking cigars. She has never used smokeless tobacco. She reports that she does not currently use alcohol. She reports that she does not currently use drugs after having used the following drugs: Marijuana. Family History:  Family History  Problem Relation Age of Onset   Diabetes Mother    Hypertension Mother    Stroke Mother      HOME MEDICATIONS: Allergies as of 10/11/2021   No Known Allergies  Medication List        Accurate as of October 10, 2021  7:31 AM. If you have any questions, ask your nurse or doctor.          Accu-Chek Guide Me w/Device Kit Use as instructed to check blood sugar daily   Accu-Chek Guide test strip Generic drug: glucose blood Use as instructed to check blood sugar 2 times daily   chlorhexidine 0.12 % solution Commonly known as: PERIDEX Use as directed 5 mLs in the mouth or throat 2 (two) times daily.   fluticasone-salmeterol 250-50 MCG/ACT Aepb Commonly known as: Advair Diskus Inhale 1 puff into the lungs in the morning and at bedtime.    gabapentin 800 MG tablet Commonly known as: NEURONTIN Take 800 mg by mouth in the morning, at noon, in the evening, and at bedtime.   glipiZIDE 10 MG tablet Commonly known as: GLUCOTROL Take 2 tablets (20 mg total) by mouth 2 (two) times daily before a meal.   ondansetron 4 MG tablet Commonly known as: ZOFRAN Take 1 tablet (4 mg total) by mouth every 6 (six) hours as needed for nausea.   oxyCODONE-acetaminophen 5-325 MG tablet Commonly known as: PERCOCET/ROXICET Take 1 tablet by mouth every 6 (six) hours as needed for moderate pain or severe pain.   pantoprazole 40 MG tablet Commonly known as: PROTONIX Take 1 tablet (40 mg total) by mouth daily.   rizatriptan 10 MG disintegrating tablet Commonly known as: MAXALT-MLT Take 1 tablet (10 mg total) by mouth as needed for migraine. May repeat in 2 hours if needed   tiZANidine 4 MG tablet Commonly known as: ZANAFLEX Take 4 mg by mouth in the morning, at noon, in the evening, and at bedtime.   topiramate 50 MG tablet Commonly known as: TOPAMAX Take 1 tablet (50 mg total) by mouth 2 (two) times daily.   Trulicity 1.5 OL/0.7EM Sopn Generic drug: Dulaglutide Inject 1.5 mg into the skin once a week.   Ventolin HFA 108 (90 Base) MCG/ACT inhaler Generic drug: albuterol 2 puffs every 6 (six) hours as needed for wheezing or shortness of breath.         OBJECTIVE:   Vital Signs: There were no vitals taken for this visit.  Wt Readings from Last 3 Encounters:  09/13/21 185 lb 8 oz (84.1 kg)  09/12/21 185 lb 6.4 oz (84.1 kg)  09/11/21 185 lb 6.4 oz (84.1 kg)     Exam: General: Pt appears drowsy, c/o arthralgia   Lungs: Clear with good BS bilat with no rales, rhonchi, or wheezes  Heart: RRR   Extremities: No  pretibial edema.   Neuro: MS is good with appropriate affect, pt is alert and Ox3    DM foot exam: 10/10/2020 The skin of the feet is intact without sores or ulcerations. The pedal pulses are 2+ on right and 2+ on  left. The sensation is absent to a screening 5.07, 10 gram monofilament bilaterally     DATA REVIEWED:  Lab Results  Component Value Date   HGBA1C 7.6 (A) 04/12/2021   HGBA1C 6.9 (A) 10/26/2020   HGBA1C 7.3 (A) 10/10/2020    Latest Reference Range & Units 09/11/21 11:40  COMPREHENSIVE METABOLIC PANEL  Rpt !  Sodium 135 - 145 mmol/L 135  Potassium 3.5 - 5.1 mmol/L 4.1  Chloride 98 - 111 mmol/L 109  CO2 22 - 32 mmol/L 20 (L)  Glucose 70 - 99 mg/dL 199 (H)  BUN 6 - 20 mg/dL 9  Creatinine  0.44 - 1.00 mg/dL 0.66  Calcium 8.9 - 10.3 mg/dL 8.5 (L)  Anion gap 5 - 15  6  Alkaline Phosphatase 38 - 126 U/L 86  Albumin 3.5 - 5.0 g/dL 3.9  AST 15 - 41 U/L 14 (L)  ALT 0 - 44 U/L 10  Total Protein 6.5 - 8.1 g/dL 6.9  Total Bilirubin 0.3 - 1.2 mg/dL 1.4 (H)  GFR, Estimated >60 mL/min >60  LDH 98 - 192 U/L 151    ASSESSMENT / PLAN / RECOMMENDATIONS:   1) Type 2 Diabetes Mellitus, Optimally controlled, With Neuropathic and retinopathic complications - Most recent A1c of 7.6  %. Goal A1c < 7.0 %.    - A1c remains above goal at 7.6% -Her BG's continue to be elevated on meter review today -On her last visit here patient stated that she is not on Trulicity so I prescribed Jardiance but today she tells me she is not on Jardiance but she did start Trulicity in August 5993. -She did have 2 episodes of vomiting the last 2 night, I personally do not think this is related to Trulicity as she has been on it for 5 months without issues and there has been no recent change in dose, she believes this may have to do with certain medications that she took -I will increase her Trulicity and she was advised to monitor for GI side effects -We will not start Jardiance at this time  MEDICATIONS: Continue glipizide 10 mg 2 tabs BID  Increase Trulicity to 1.5 mg weekly  EDUCATION / INSTRUCTIONS: BG monitoring instructions: Patient is instructed to check her blood sugars 2 times a day, fasting and supper  time. Call [facility] if: BG persistently < 70  I reviewed the Rule of 15 for the treatment of hypoglycemia in detail with the patient. Literature supplied.    F/U in 6 months    Signed electronically by: Mack Guise, MD  [facility]  [address] [place], [place] [address] Phone: [phone] FAX: [phone]   CC: Lauretta Grill, NP [address] [address] [place] [address] Phone: [phone]  Fax: [phone]  Return to Endocrinology clinic as below: Future Appointments  Date Time Provider [place]  10/10/2021 10:45 AM Marybelle Killings, MD [facility] None  10/11/2021  8:50 AM Jisell Majer, Melanie Crazier, MD [facility] None  10/13/2021  9:45 AM Jessy Oto, MD [facility] None  12/04/2021 11:00 AM Tyler Pita, MD [facility] None  12/26/2021  2:00 PM Penni Bombard, MD [facility] None  01/16/2022 12:45 PM [facility] None  01/18/2022  2:30 PM Earlie Server, MD [facility] None  01/18/2022  2:45 PM [facility] 3 [facility] None

## 2021-10-11 ENCOUNTER — Ambulatory Visit (INDEPENDENT_AMBULATORY_CARE_PROVIDER_SITE_OTHER): Payer: Medicaid Other | Admitting: Internal Medicine

## 2021-10-11 ENCOUNTER — Encounter: Payer: Self-pay | Admitting: Internal Medicine

## 2021-10-11 VITALS — BP 110/70 | HR 88 | Ht 69.0 in | Wt 187.4 lb

## 2021-10-11 DIAGNOSIS — E1142 Type 2 diabetes mellitus with diabetic polyneuropathy: Secondary | ICD-10-CM | POA: Diagnosis not present

## 2021-10-11 DIAGNOSIS — E1165 Type 2 diabetes mellitus with hyperglycemia: Secondary | ICD-10-CM

## 2021-10-11 LAB — POCT GLYCOSYLATED HEMOGLOBIN (HGB A1C): Hemoglobin A1C: 5.3 % (ref 4.0–5.6)

## 2021-10-11 MED ORDER — GLIPIZIDE 10 MG PO TABS: ORAL_TABLET | ORAL | 3 refills | Status: DC

## 2021-10-11 NOTE — Patient Instructions (Addendum)
-   Glipizide 10 mg, TWO  tablets before Breakfast and Decrease ONE  Tablet Before supper  - STOP Trulicity     HOW TO TREAT LOW BLOOD SUGARS (Blood sugar LESS THAN 70 MG/DL) Please follow the RULE OF 15 for the treatment of hypoglycemia treatment (when your (blood sugars are less than 70 mg/dL)   STEP 1: Take 15 grams of carbohydrates when your blood sugar is low, which includes:  3-4 GLUCOSE TABS  OR 3-4 OZ OF JUICE OR REGULAR SODA OR ONE TUBE OF GLUCOSE GEL    STEP 2: RECHECK blood sugar in 15 MINUTES STEP 3: If your blood sugar is still low at the 15 minute recheck --> then, go back to STEP 1 and treat AGAIN with another 15 grams of carbohydrates.

## 2021-10-13 ENCOUNTER — Ambulatory Visit (INDEPENDENT_AMBULATORY_CARE_PROVIDER_SITE_OTHER): Payer: Medicaid Other | Admitting: Specialist

## 2021-10-13 VITALS — BP 95/64 | HR 96 | Ht 69.0 in | Wt 185.0 lb

## 2021-10-13 DIAGNOSIS — M5126 Other intervertebral disc displacement, lumbar region: Secondary | ICD-10-CM | POA: Diagnosis not present

## 2021-10-13 DIAGNOSIS — M5136 Other intervertebral disc degeneration, lumbar region: Secondary | ICD-10-CM

## 2021-10-13 DIAGNOSIS — M48062 Spinal stenosis, lumbar region with neurogenic claudication: Secondary | ICD-10-CM | POA: Diagnosis not present

## 2021-10-13 DIAGNOSIS — M4646 Discitis, unspecified, lumbar region: Secondary | ICD-10-CM

## 2021-10-13 DIAGNOSIS — M4316 Spondylolisthesis, lumbar region: Secondary | ICD-10-CM

## 2021-10-13 DIAGNOSIS — M5416 Radiculopathy, lumbar region: Secondary | ICD-10-CM

## 2021-10-13 NOTE — Progress Notes (Signed)
Office Visit Note   Patient: Melinda Stone           Date of Birth: 02/24/1981           MRN: [id number] Visit Date: 10/13/2021              Requested by: Anselm Jungling, NP [address] [address] [place],  [place] [address] PCP: Anselm Jungling, NP   Assessment & Plan: Visit Diagnoses:  1. Recurrent herniation of lumbar disc   2. Degenerative disc disease, lumbar   3. Spondylolisthesis, lumbar region   4. Spinal stenosis of lumbar region with neurogenic claudication   5. Radiculopathy, lumbar region     Plan: Obtain blood work Sed rate and CRP to assess risk of disc changes being due to indolent infection, I suspect that it is primarily disc inflamation due to degenerative disc disease but assessing lab helps to be sure.  Avoid bending, stooping and avoid lifting weights greater than 10 lbs. Avoid prolong standing and walking. Order for a new walker with wheels. Surgery scheduling secretary Tivis Ringer, will call you in the next week to schedule for surgery.  Surgery recommended is a two level lumbar fusion L4-5 and L5-S1 this would be done with rods, screws and cages with local bone graft and allograft (donor bone graft). Take hydrocodone for for pain. Risk of surgery includes risk of infection 1 in 200 patients, bleeding 1/2% chance you would need a transfusion.   Risk to the nerves is one in 10,000. You will need to use a brace for 3 months and wean from the brace on the 4th month. Expect improved walking and standing tolerance. Expect relief of leg pain but numbness may persist depending on the length and degree of pressure that has been present.  Follow-Up Instructions: Return in about 4 weeks (around 11/10/2021).   Orders:  No orders of the defined types were placed in this encounter.  No orders of the defined types were placed in this encounter.     Procedures: No procedures performed   Clinical Data: Findings:  Study Result  Narrative & Impression CLINICAL  DATA:  Initial evaluation for low back pain with radiation into both lower extremities, bilateral leg and foot numbness. Prior surgery.   EXAM: MRI LUMBAR SPINE WITHOUT AND WITH CONTRAST   TECHNIQUE: Multiplanar and multiecho pulse sequences of the lumbar spine were obtained without and with intravenous contrast.   CONTRAST:  71mL MULTIHANCE GADOBENATE DIMEGLUMINE 529 MG/ML IV SOLN   COMPARISON:  Prior MRI from 12/02/2020.   FINDINGS: Segmentation: Standard. Lowest well-formed disc space labeled the L5-S1 level.   Alignment: Slight focal kyphotic angulation at L4-5, stable. Trace 3 mm retrolisthesis of L5 on S1, also unchanged. Alignment otherwise normal with preservation of the normal lumbar lordosis.   Vertebrae: Vertebral body height maintained without acute or interval fracture. Bone marrow signal intensity diffusely heterogeneous without worrisome osseous lesion. Discogenic reactive endplate change with marrow edema and enhancement present about the L4-5 interspace, somewhat progressed from prior. No other abnormal marrow edema or enhancement.   Conus medullaris and cauda equina: Conus extends to the L1 level. Conus and cauda equina appear normal.   Paraspinal and other soft tissues: Chronic postoperative changes present within the posterior paraspinous soft tissues. No adverse features.   Disc levels:   L1-2:  Unremarkable.   L2-3: Negative interspace. Mild ligament flavum hypertrophy. No canal or foraminal stenosis.   L3-4: Negative interspace. Mild bilateral facet and ligament flavum hypertrophy. No significant  spinal stenosis. Foramina remain patent.   L4-5: Degenerative intervertebral disc space narrowing with disc desiccation and diffuse disc bulge. Associated reactive endplate change with marginal endplate osteophytic spurring. Postoperative changes from prior wide posterior decompression. Enhancing granulation tissue present within the epidural space  bilaterally. Small nonenhancing residual and/or recurrent left subarticular to foraminal disc protrusion is seen (series 9, image 32). Disc material closely approximates both the exiting left L4 and descending L5 nerve roots. Mild bilateral facet hypertrophy with trace joint effusion on the right. No significant canal or lateral recess stenosis. Mild to moderate bilateral L4 foraminal stenosis.   L5-S1: Trace retrolisthesis. Degenerative intervertebral disc space narrowing with disc desiccation. Associated reactive endplate spurring. Sequelae of prior left hemi laminectomy and partial facetectomy. Residual and/or recurrent broad-based nonenhancing left subarticular to foraminal disc protrusion (series 9, image 38). Disc material closely approximates both the exiting left L5 and descending S1 nerve roots. Residual mild left lateral recess stenosis. Central canal remains patent. Mild to moderate bilateral L5 foraminal stenosis.   IMPRESSION: 1. Postoperative changes from prior posterior decompression at L4-5. Small residual and/or recurrent left subarticular to foraminal disc protrusion at this level, closely approximating and potentially affecting either the exiting left L4 or descending L5 nerve roots. 2. Postoperative changes from prior left posterior decompression at L5-S1. Residual and/or recurrent broad-based left subarticular to foraminal disc protrusion at this level, closely approximating and potentially affecting either the exiting left L5 or descending S1 nerve roots.     Electronically Signed   By: Rise Mu M.D.   On: 09/26/2021 05:12     Subjective: Chief Complaint  Patient presents with   Lower Back - Follow-up    HPI  Review of Systems   Objective: Vital Signs: BP 95/64 (BP Location: Left Arm, Patient Position: Sitting, Cuff Size: Normal)   Pulse 96   Ht 5\' 9"  (1.753 m)   Wt 185 lb (83.9 kg)   BMI 27.32 kg/m   Physical Exam  Ortho  Exam  Specialty Comments:  No specialty comments available.  Imaging: No results found.   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   Chronic pain syndrome 06/14/2020   Pharmacologic therapy 06/14/2020   Disorder of skeletal system 06/14/2020   Problems influencing health status 06/14/2020   History of marijuana use 06/14/2020   History of illicit drug use 06/14/2020   Abnormal drug screen (05/25/2020) 06/14/2020   Marijuana use 06/14/2020   Abnormal MRI, lumbar spine (05/16/2020) 06/14/2020   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, L5-S1) 06/14/2020   Lumbar lateral recess stenosis (Left: L4-5, L5-S1) 06/14/2020   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) 06/14/2020   S/P lumbar microdiscectomy 06/07/2020   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   GI bleed 12/01/2019   Muscle spasm 11/06/2019   Sciatica 11/06/2019   Low back pain 02/04/2019   Type 2 diabetes mellitus with diabetic polyneuropathy, without long-term current use of insulin (HCC) 12/03/2018   Type 2 diabetes mellitus with hyperglycemia, without long-term current use of insulin (HCC) 05/15/2018   Past Medical History:  Diagnosis Date   Anemia    Arthritis 08/17/2020  Chronic back pain    Diabetes mellitus without complication (HCC)    type 2   Migraine    Ovarian cyst    Pneumonia    Polyneuropathy     Family History  Problem Relation Age of Onset   Diabetes Mother    Hypertension Mother    Stroke Mother     Past Surgical History:  Procedure Laterality Date   CESAREAN SECTION      CHOLECYSTECTOMY  2004   LUMBAR LAMINECTOMY N/A 05/30/2020   Procedure: Lumbar five Laminectomy,  Bilateral Microdiscectomy, Left Lumbar five -Sacral one Microdiscectomy;  Surgeon: [name], MD;  Location: [facility] OR;  Service: Orthopedics;  Laterality: N/A;   LUMBAR LAMINECTOMY Left 11/14/2020   Procedure: LEFT LUMBAR FOUR-FIVE MICRODISCECTOMY;  Surgeon: [name], MD;  Location: [facility] OR;  Service: Orthopedics;  Laterality: Left;   TUBAL LIGATION  10/19/2010   Social History   Occupational History    Comment: home maker  Tobacco Use   Smoking status: Some Days    Types: Cigars   Smokeless tobacco: Never   Tobacco comments:    black and milds-- 5 cigars per week-09/01/2021  Vaping Use   Vaping Use: Never used  Substance and Sexual Activity   Alcohol use: Not Currently   Drug use: Not Currently    Types: Marijuana    Comment: per patient stopped smoking marijuana Mar 2022   Sexual activity: Yes    Birth control/protection: None

## 2021-10-13 NOTE — Patient Instructions (Addendum)
Plan: Obtain blood work Sed rate and CRP to assess risk of disc changes being due to indolent infection, I suspect that it is primarily disc inflamation due to degenerative disc disease but assessing lab helps to be sure.  Avoid bending, stooping and avoid lifting weights greater than 10 lbs. Avoid prolong standing and walking. Order for a new walker with wheels. Surgery scheduling secretary Tivis Ringer, will call you in the next week to schedule for surgery.  Surgery recommended is a two level lumbar fusion L4-5 and L5-S1 this would be done with rods, screws and cages with local bone graft and allograft (donor bone graft). Take hydrocodone for for pain. Risk of surgery includes risk of infection 1 in 200 patients, bleeding 1/2% chance you would need a transfusion.   Risk to the nerves is one in 10,000. You will need to use a brace for 3 months and wean from the brace on the 4th month. Expect improved walking and standing tolerance. Expect relief of leg pain but numbness may persist depending on the length and degree of pressure that has been present.

## 2021-10-14 LAB — SEDIMENTATION RATE: Sed Rate: 2 mm/h (ref 0–20)

## 2021-10-14 LAB — C-REACTIVE PROTEIN: CRP: 0.3 mg/L (ref <8.0)

## 2021-10-24 ENCOUNTER — Ambulatory Visit: Payer: Medicaid Other

## 2021-10-24 DIAGNOSIS — Z9189 Other specified personal risk factors, not elsewhere classified: Secondary | ICD-10-CM

## 2021-10-24 DIAGNOSIS — R0683 Snoring: Secondary | ICD-10-CM

## 2021-10-27 DIAGNOSIS — R0683 Snoring: Secondary | ICD-10-CM

## 2021-10-31 ENCOUNTER — Telehealth: Payer: Self-pay

## 2021-10-31 ENCOUNTER — Emergency Department (HOSPITAL_COMMUNITY)
Admission: EM | Admit: 2021-10-31 | Discharge: 2021-10-31 | Disposition: A | Discharge status: Home / Self Care | Payer: Medicaid Other | Attending: Emergency Medicine | Admitting: Emergency Medicine

## 2021-10-31 ENCOUNTER — Encounter (HOSPITAL_COMMUNITY): Payer: Self-pay

## 2021-10-31 ENCOUNTER — Other Ambulatory Visit: Payer: Self-pay

## 2021-10-31 DIAGNOSIS — E119 Type 2 diabetes mellitus without complications: Secondary | ICD-10-CM | POA: Diagnosis not present

## 2021-10-31 DIAGNOSIS — K529 Noninfective gastroenteritis and colitis, unspecified: Secondary | ICD-10-CM | POA: Insufficient documentation

## 2021-10-31 DIAGNOSIS — R109 Unspecified abdominal pain: Secondary | ICD-10-CM | POA: Diagnosis present

## 2021-10-31 LAB — CBC WITH DIFFERENTIAL/PLATELET
Abs Immature Granulocytes: 0.07 10*3/uL (ref 0.00–0.07)
Basophils Absolute: 0.1 10*3/uL (ref 0.0–0.1)
Basophils Relative: 1 %
Eosinophils Absolute: 0.3 10*3/uL (ref 0.0–0.5)
Eosinophils Relative: 5 %
HCT: 33.1 % — ABNORMAL LOW (ref 36.0–46.0)
Hemoglobin: 11.6 g/dL — ABNORMAL LOW (ref 12.0–15.0)
Immature Granulocytes: 1 %
Lymphocytes Relative: 35 %
Lymphs Abs: 2.2 10*3/uL (ref 0.7–4.0)
MCH: 29.4 pg (ref 26.0–34.0)
MCHC: 35 g/dL (ref 30.0–36.0)
MCV: 83.8 fL (ref 80.0–100.0)
Monocytes Absolute: 0.5 10*3/uL (ref 0.1–1.0)
Monocytes Relative: 7 %
Neutro Abs: 3.2 10*3/uL (ref 1.7–7.7)
Neutrophils Relative %: 51 %
Platelets: 167 10*3/uL (ref 150–400)
RBC: 3.95 MIL/uL (ref 3.87–5.11)
RDW: 20.4 % — ABNORMAL HIGH (ref 11.5–15.5)
WBC: 6.2 10*3/uL (ref 4.0–10.5)
nRBC: 0 % (ref 0.0–0.2)

## 2021-10-31 LAB — RAPID URINE DRUG SCREEN, HOSP PERFORMED
Amphetamines: NOT DETECTED
Barbiturates: NOT DETECTED
Benzodiazepines: NOT DETECTED
Cocaine: NOT DETECTED
Opiates: NOT DETECTED
Tetrahydrocannabinol: NOT DETECTED

## 2021-10-31 LAB — COMPREHENSIVE METABOLIC PANEL
ALT: 16 U/L (ref 0–44)
AST: 17 U/L (ref 15–41)
Albumin: 3.9 g/dL (ref 3.5–5.0)
Alkaline Phosphatase: 77 U/L (ref 38–126)
Anion gap: 6 (ref 5–15)
BUN: 9 mg/dL (ref 6–20)
CO2: 19 mmol/L — ABNORMAL LOW (ref 22–32)
Calcium: 8.6 mg/dL — ABNORMAL LOW (ref 8.9–10.3)
Chloride: 114 mmol/L — ABNORMAL HIGH (ref 98–111)
Creatinine, Ser: 0.5 mg/dL (ref 0.44–1.00)
GFR, Estimated: >60 mL/min (ref >60)
Glucose, Bld: 90 mg/dL (ref 70–99)
Potassium: 3.6 mmol/L (ref 3.5–5.1)
Sodium: 139 mmol/L (ref 135–145)
Total Bilirubin: 1.1 mg/dL (ref 0.3–1.2)
Total Protein: 6.7 g/dL (ref 6.5–8.1)

## 2021-10-31 LAB — PREGNANCY, URINE: Preg Test, Ur: NEGATIVE

## 2021-10-31 LAB — URINALYSIS, ROUTINE W REFLEX MICROSCOPIC
Bilirubin Urine: NEGATIVE
Glucose, UA: NEGATIVE mg/dL
Ketones, ur: NEGATIVE mg/dL
Leukocytes,Ua: NEGATIVE
Nitrite: NEGATIVE
Protein, ur: NEGATIVE mg/dL
Specific Gravity, Urine: 1.018 (ref 1.005–1.030)
pH: 5 (ref 5.0–8.0)

## 2021-10-31 LAB — LIPASE, BLOOD: Lipase: 32 U/L (ref 11–51)

## 2021-10-31 LAB — CBG MONITORING, ED: Glucose-Capillary: 153 mg/dL — ABNORMAL HIGH (ref 70–99)

## 2021-10-31 MED ORDER — MORPHINE SULFATE (PF) 4 MG/ML IV SOLN
4.0000 mg | Freq: Once | INTRAVENOUS | Status: AC
  Administered 2021-10-31: 4 mg via INTRAVENOUS
  Filled 2021-10-31: qty 1

## 2021-10-31 MED ORDER — ONDANSETRON 4 MG PO TBDP: 4.0000 mg | ORAL_TABLET | Freq: Three times a day (TID) | ORAL | 0 refills | Status: DC | PRN

## 2021-10-31 MED ORDER — LACTATED RINGERS IV BOLUS
2000.0000 mL | Freq: Once | INTRAVENOUS | Status: AC
  Administered 2021-10-31: 2000 mL via INTRAVENOUS

## 2021-10-31 MED ORDER — DICYCLOMINE HCL 20 MG PO TABS: 20.0000 mg | ORAL_TABLET | Freq: Two times a day (BID) | ORAL | 0 refills | Status: DC

## 2021-10-31 MED ORDER — ONDANSETRON HCL 4 MG/2ML IJ SOLN
4.0000 mg | Freq: Once | INTRAMUSCULAR | Status: AC
  Administered 2021-10-31: 4 mg via INTRAVENOUS
  Filled 2021-10-31: qty 2

## 2021-10-31 NOTE — ED Provider Notes (Signed)
[facility]-EMERGENCY DEPT Provider Note   CSN: [id number] Arrival date & time: 10/31/21  5573     History  Chief Complaint  Patient presents with   Emesis   Back Pain    Melinda Stone is a 41 y.o. female.  41 year old female presents today for evaluation of abdominal pain, nausea, vomiting, and diarrhea.  This has been ongoing for about 3 to 4 days.  Endorses lightheadedness for the past couple days.  Endorses decreased p.o. intake.  Triage note also mentions back pain, and leg pain.  Reports that she takes muscle relaxer, oxy prescribed to her by her PCP with good relief.  Currently denies any back pain.  She denies dysuria, vaginal discharge, or flank pain.  The history is provided by the patient. No language interpreter was used.       Home Medications Prior to Admission medications   Medication Sig Start Date End Date Taking? Authorizing Provider  chlorhexidine (PERIDEX) 0.12 % solution Use as directed 5 mLs in the mouth or throat 2 (two) times daily. 06/02/21   [provider]  fluticasone-salmeterol (ADVAIR DISKUS) 250-50 MCG/ACT AEPB Inhale 1 puff into the lungs in the morning and at bedtime. 07/10/21   Salena Saner, MD  gabapentin (NEURONTIN) 800 MG tablet Take 800 mg by mouth in the morning, at noon, in the evening, and at bedtime. 04/04/21   [provider]  glipiZIDE (GLUCOTROL) 10 MG tablet Take 2 tablets (20 mg total) by mouth daily before breakfast AND 1 tablet (10 mg total) daily before supper. 10/11/21   Shamleffer, Konrad Dolores, MD  ondansetron (ZOFRAN) 4 MG tablet Take 1 tablet (4 mg total) by mouth every 6 (six) hours as needed for nausea. 06/11/21   Alwyn Ren, MD  oxyCODONE-acetaminophen (PERCOCET/ROXICET) 5-325 MG tablet Take 1 tablet by mouth every 6 (six) hours as needed for moderate pain or severe pain. 03/13/21   [provider]  pantoprazole (PROTONIX) 40 MG tablet Take 1 tablet (40 mg  total) by mouth daily. 06/12/21   Alwyn Ren, MD  rizatriptan (MAXALT-MLT) 10 MG disintegrating tablet Take 1 tablet (10 mg total) by mouth as needed for migraine. May repeat in 2 hours if needed 06/20/21   Penumalli, Glenford Bayley, MD  tiZANidine (ZANAFLEX) 4 MG tablet Take 4 mg by mouth in the morning, at noon, in the evening, and at bedtime. 05/18/21   [provider]  topiramate (TOPAMAX) 50 MG tablet Take 1 tablet (50 mg total) by mouth 2 (two) times daily. 06/20/21   Penumalli, Glenford Bayley, MD  VENTOLIN HFA 108 (90 Base) MCG/ACT inhaler 2 puffs every 6 (six) hours as needed for wheezing or shortness of breath. 06/04/21   [provider]      Allergies    Patient has no known allergies.    Review of Systems   Review of Systems  Respiratory:  Negative for shortness of breath.   All other systems reviewed and are negative.   Physical Exam Updated Vital Signs BP (!) 95/59 (BP Location: Right Arm)   Pulse 95   Temp 98.2 F (36.8 C) (Oral)   Resp 14   SpO2 100%  Physical Exam Vitals and nursing note reviewed.  Constitutional:      General: She is not in acute distress.    Appearance: Normal appearance. She is not ill-appearing.  HENT:     Head: Normocephalic and atraumatic.     Nose: Nose normal.  Eyes:  General: No scleral icterus.    Extraocular Movements: Extraocular movements intact.     Conjunctiva/sclera: Conjunctivae normal.  Cardiovascular:     Rate and Rhythm: Regular rhythm.     Pulses: Normal pulses.  Pulmonary:     Effort: Pulmonary effort is normal. No respiratory distress.     Breath sounds: Normal breath sounds. No wheezing or rales.  Abdominal:     General: There is no distension.     Palpations: Abdomen is soft.     Tenderness: There is abdominal tenderness. There is no right CVA tenderness, left CVA tenderness or guarding.  Musculoskeletal:        General: Normal range of motion.     Cervical back: Normal range of motion.  Skin:     General: Skin is warm and dry.  Neurological:     General: No focal deficit present.     Mental Status: She is alert. Mental status is at baseline.     ED Results / Procedures / Treatments   Labs (all labs ordered are listed, but only abnormal results are displayed) Labs Reviewed  URINALYSIS, ROUTINE W REFLEX MICROSCOPIC - Abnormal; Notable for the following components:      Result Value   Color, Urine AMBER (*)    Hgb urine dipstick MODERATE (*)    Bacteria, UA FEW (*)    All other components within normal limits  CBG MONITORING, ED - Abnormal; Notable for the following components:   Glucose-Capillary 153 (*)    All other components within normal limits  RAPID URINE DRUG SCREEN, HOSP PERFORMED  PREGNANCY, URINE  CBC WITH DIFFERENTIAL/PLATELET  COMPREHENSIVE METABOLIC PANEL  LIPASE, BLOOD    EKG None  Radiology No results found.  Procedures Procedures    Medications Ordered in ED Medications  morphine (PF) 4 MG/ML injection 4 mg (has no administration in time range)  ondansetron (ZOFRAN) injection 4 mg (has no administration in time range)    ED Course/ Medical Decision Making/ A&P Clinical Course as of 10/31/21 1905  Tue Oct 31, 2021  1810 On reevaluation patient reports improvement in abdominal pain.  Reports improvement in her leg pain as well.  Remained with her back pain throughout her emergency room stay.  Fluids infusing. [AA]    Clinical Course User Index [AA] Marita Kansas, PA-C                           Medical Decision Making Amount and/or Complexity of Data Reviewed Labs: ordered.  Risk Prescription drug management.   Medical Decision Making / ED Course   This patient presents to the ED for concern of abdominal pain, vomiting, diarrhea, this involves an extensive number of treatment options, and is a complaint that carries with it a high risk of complications and morbidity.  The differential diagnosis includes gastroenteritis, appendicitis,  peritonitis, diverticulitis  MDM: 41 year old female presents today for evaluation of abdominal pain, vomiting, diarrhea.  Ongoing for several days.  Now associated with lightheadedness.  Denies fever, chills.  She is afebrile here.  Initially tachycardic.  Generalized tenderness without guarding.  Denies symptoms concerning for UTI.  Without vaginal discharge.  Primarily describes her pain as being in the upper abdomen.  On my exam she is orthostatic.  We will add on CBC, CMP, lipase, UA, and pain medication as well as IV hydration and reevaluate. Patient continues to remain without abdominal pain.  Without orthostasis.  Likely patient has gastroenteritis.  We  did discuss additional work-up with CT imaging but patient defers.  Strict return precautions discussed.  Patient is appropriate for discharge.  Discharged in stable condition.  Return precautions discussed.  Discussed adequate hydration.  Will discharge with Bentyl, Zofran.  Discussed follow-up with her PCP.   Lab Tests: -I ordered, reviewed, and interpreted labs.   The pertinent results include:   Labs Reviewed  URINALYSIS, ROUTINE W REFLEX MICROSCOPIC - Abnormal; Notable for the following components:      Result Value   Color, Urine AMBER (*)    Hgb urine dipstick MODERATE (*)    Bacteria, UA FEW (*)    All other components within normal limits  CBG MONITORING, ED - Abnormal; Notable for the following components:   Glucose-Capillary 153 (*)    All other components within normal limits  RAPID URINE DRUG SCREEN, HOSP PERFORMED  PREGNANCY, URINE  CBC WITH DIFFERENTIAL/PLATELET  COMPREHENSIVE METABOLIC PANEL  LIPASE, BLOOD      EKG  EKG Interpretation  Date/Time:    Ventricular Rate:    PR Interval:    QRS Duration:   QT Interval:    QTC Calculation:   R Axis:     Text Interpretation:          Medicines ordered and prescription drug management: Meds ordered this encounter  Medications   morphine (PF) 4 MG/ML  injection 4 mg   ondansetron (ZOFRAN) injection 4 mg   lactated ringers bolus 2,000 mL    -I have reviewed the patients home medicines and have made adjustments as needed  Reevaluation: After the interventions noted above, I reevaluated the patient and found that they have :improved  Co morbidities that complicate the patient evaluation  Past Medical History:  Diagnosis Date   Anemia    Arthritis 08/17/2020   Chronic back pain    Diabetes mellitus without complication (HCC)    type 2   Migraine    Ovarian cyst    Pneumonia    Polyneuropathy       Dispostion: Patient is appropriate for discharge.  Discharged in stable condition.  Return precautions discussed.  Final Clinical Impression(s) / ED Diagnoses Final diagnoses:  Gastroenteritis    Rx / DC Orders ED Discharge Orders          Ordered    ondansetron (ZOFRAN-ODT) 4 MG disintegrating tablet  Every 8 hours PRN        10/31/21 1905    dicyclomine (BENTYL) 20 MG tablet  2 times daily        10/31/21 1905              Marita Kansas, PA-C 10/31/21 1906    Linwood Dibbles, MD 11/01/21 [phone]

## 2021-10-31 NOTE — Telephone Encounter (Signed)
-----   Message from Salena Saner, MD sent at 10/31/2021 10:54 AM EDT ----- Her sleep study did not show any sleep apnea.  Her oxygen levels maintained at 91%.  This is good news.

## 2021-10-31 NOTE — ED Triage Notes (Signed)
Pt reports leg pain and back pain that has become worse over the past few days. Pt also endorses abdominal pain and emesis.

## 2021-10-31 NOTE — Telephone Encounter (Signed)
Patient is aware of results and voiced her understanding.  Nothing further needed.   

## 2021-10-31 NOTE — Telephone Encounter (Signed)
Lm x1 for patient.  

## 2021-10-31 NOTE — Discharge Instructions (Addendum)
Your work-up today was reassuring.  Blood work did not show any signs of infection.  Kidney function was normal.  Your symptoms improved after IV fluids, and pain medication.  Continue taking your home medications.  Of also sent in Zofran for nausea, as well as Bentyl for abdominal pain.  If you have worsening symptoms please return to the emergency room otherwise follow-up with your primary care provider.

## 2021-10-31 NOTE — ED Provider Triage Note (Signed)
Emergency Medicine Provider Triage Evaluation Note  Melinda Stone , a 41 y.o. female  was evaluated in triage.  Pt complains of leg pain and back pain attributes to ruptured disc in back, nausea and vomiting for the past few days, progressively worsening. No relief with home meds- oxy 5mg  and a muscle relaxant, IBU.  Review of Systems  Positive: Leg and back pain, vomiting  Negative: fever  Physical Exam  BP 96/62 (BP Location: Left Arm)   Pulse (!) 108   Temp 98 F (36.7 C) (Oral)   Resp 16   SpO2 100%  Gen:   Awake, no distress   Resp:  Normal effort  MSK:   Moves extremities without difficulty  Other:    Medical Decision Making  Medically screening exam initiated at 9:52 AM.  Appropriate orders placed.  Melinda Stone was informed that the remainder of the evaluation will be completed by another provider, this initial triage assessment does not replace that evaluation, and the importance of remaining in the ED until their evaluation is complete.     , PA-C 10/31/21 [phone]

## 2021-11-10 ENCOUNTER — Ambulatory Visit (INDEPENDENT_AMBULATORY_CARE_PROVIDER_SITE_OTHER): Payer: Medicaid Other | Admitting: Specialist

## 2021-11-10 ENCOUNTER — Encounter: Payer: Self-pay | Admitting: Specialist

## 2021-11-10 VITALS — BP 83/58 | HR 94 | Ht 69.0 in | Wt 189.0 lb

## 2021-11-10 DIAGNOSIS — M48062 Spinal stenosis, lumbar region with neurogenic claudication: Secondary | ICD-10-CM

## 2021-11-10 DIAGNOSIS — M5136 Other intervertebral disc degeneration, lumbar region: Secondary | ICD-10-CM

## 2021-11-10 DIAGNOSIS — M5126 Other intervertebral disc displacement, lumbar region: Secondary | ICD-10-CM

## 2021-11-10 DIAGNOSIS — M4646 Discitis, unspecified, lumbar region: Secondary | ICD-10-CM

## 2021-11-10 DIAGNOSIS — M4316 Spondylolisthesis, lumbar region: Secondary | ICD-10-CM

## 2021-11-10 DIAGNOSIS — M5416 Radiculopathy, lumbar region: Secondary | ICD-10-CM

## 2021-11-10 DIAGNOSIS — Z9889 Other specified postprocedural states: Secondary | ICD-10-CM

## 2021-11-10 MED ORDER — HYDROXYZINE PAMOATE 50 MG PO CAPS: 50.0000 mg | ORAL_CAPSULE | Freq: Three times a day (TID) | ORAL | 0 refills | Status: DC | PRN

## 2021-11-10 NOTE — Patient Instructions (Signed)
Plan: Avoid bending, stooping and avoid lifting weights greater than 10 lbs. Avoid prolong standing and walking. Order for a new walker with wheels. Surgery scheduling secretary Tivis Ringer, will call you in the next week to schedule for surgery.  Surgery recommended is a two level lumbar fusion L4-5 and L5-S1 this would be done with rods, screws and cages with local bone graft and allograft (donor bone graft). Take hydrocodone for for pain. Risk of surgery includes risk of infection 1 in 200 patients, bleeding 1/2% chance you would need a transfusion.   Risk to the nerves is one in 10,000. You will need to use a brace for 3 months and wean from the brace on the 4th month. Expect improved walking and standing tolerance. Expect relief of leg pain but numbness may persist depending on the length and degree of pressure that has been present. I will try to schedule your surgery with our new spine surgeon and I performing the surgery and he will be seeing patients starting in 3 weeks.  Hopefully he will agree and allow [place] to both be able to perform the planned surgery.  Vistaril 50 mg po up to TID to potentiate the affect of the oxycodone to relieve pain.

## 2021-11-10 NOTE — Progress Notes (Signed)
Office Visit Note   Patient: Melinda Stone           Date of Birth: [birth year]           MRN: [id number] Visit Date: 11/10/2021              Requested by: Anselm Jungling, NP [address] [address] [place],  [place] [address] PCP: Anselm Jungling, NP   Assessment & Plan: Visit Diagnoses: No diagnosis found.      Follow-Up Instructions: No follow-ups on file.   Orders:  No orders of the defined types were placed in this encounter.  No orders of the defined types were placed in this encounter.     Procedures: No procedures performed   Clinical Data: No additional findings.   Subjective: No chief complaint on file.   HPI  Review of Systems   Objective: Vital Signs: BP (!) 83/58 (BP Location: Left Arm, Patient Position: Sitting, Cuff Size: Large)   Pulse 94   Ht 5\' 9"  (1.753 m)   Wt 189 lb (85.7 kg)   BMI 27.91 kg/m   Physical Exam  Ortho Exam  Specialty Comments:  No specialty comments available.  Imaging: No results found.   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   Chronic pain syndrome 06/14/2020   Pharmacologic therapy 06/14/2020   Disorder of skeletal system 06/14/2020   Problems influencing health status 06/14/2020   History of marijuana use 06/14/2020   History of illicit drug use 06/14/2020   Abnormal drug screen (05/25/2020) 06/14/2020   Marijuana use 06/14/2020   Abnormal MRI, lumbar spine (05/16/2020) 06/14/2020   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, L5-S1) 06/14/2020   Lumbar lateral recess stenosis (Left: L4-5, L5-S1) 06/14/2020    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) 06/14/2020   S/P lumbar microdiscectomy 06/07/2020   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   GI bleed 12/01/2019   Muscle spasm 11/06/2019   Sciatica 11/06/2019   Low back pain 02/04/2019   Type 2 diabetes mellitus with diabetic polyneuropathy, without long-term current use of insulin (HCC) 12/03/2018   Type 2 diabetes mellitus with hyperglycemia, without long-term current use of insulin (HCC) 05/15/2018   Past Medical History:  Diagnosis Date   Anemia    Arthritis 08/17/2020   Chronic back pain    Diabetes mellitus without complication (HCC)    type 2   Migraine    Ovarian cyst    Pneumonia    Polyneuropathy     Family History  Problem Relation Age of Onset   Diabetes Mother    Hypertension Mother    Stroke Mother     Past Surgical History:  Procedure Laterality Date   CESAREAN SECTION     CHOLECYSTECTOMY  2004   LUMBAR LAMINECTOMY N/A 05/30/2020   Procedure: Lumbar five Laminectomy,  Bilateral Microdiscectomy, Left Lumbar five -Sacral one Microdiscectomy;  Surgeon: 06/01/2020, MD;  Location: [facility] OR;  Service: Orthopedics;  Laterality: N/A;   LUMBAR LAMINECTOMY Left 11/14/2020   Procedure: LEFT LUMBAR FOUR-FIVE MICRODISCECTOMY;  Surgeon: 11/16/2020, MD;  Location: [facility] OR;  Service: Orthopedics;  Laterality: Left;  TUBAL LIGATION  10/19/2010   Social History   Occupational History    Comment: home maker  Tobacco Use   Smoking status: Some Days    Types: Cigars   Smokeless tobacco: Never   Tobacco comments:    black and milds-- 5 cigars per week-09/01/2021  Vaping Use   Vaping Use: Never used  Substance and Sexual Activity   Alcohol use: Not Currently   Drug use: Not Currently    Types: Marijuana    Comment: per patient stopped smoking marijuana Mar 2022   Sexual activity: Yes    Birth control/protection: None

## 2021-12-04 ENCOUNTER — Ambulatory Visit (INDEPENDENT_AMBULATORY_CARE_PROVIDER_SITE_OTHER): Payer: Medicaid Other | Admitting: Pulmonary Disease

## 2021-12-04 ENCOUNTER — Encounter: Payer: Self-pay | Admitting: Pulmonary Disease

## 2021-12-04 VITALS — BP 102/60 | HR 80 | Temp 97.9°F | Ht 69.0 in | Wt 182.0 lb

## 2021-12-04 DIAGNOSIS — R942 Abnormal results of pulmonary function studies: Secondary | ICD-10-CM

## 2021-12-04 DIAGNOSIS — D509 Iron deficiency anemia, unspecified: Secondary | ICD-10-CM

## 2021-12-04 DIAGNOSIS — R0602 Shortness of breath: Secondary | ICD-10-CM

## 2021-12-04 DIAGNOSIS — J4530 Mild persistent asthma, uncomplicated: Secondary | ICD-10-CM | POA: Diagnosis not present

## 2021-12-04 NOTE — Patient Instructions (Signed)
We have rescheduled the scan for your chest and this will be done in [place].  We will set up your follow-up appointment in [place] which will be easier for you to go to.  The clinic is on [employer]:  [address] [place], [place]: [phone] Fax: [phone]  We will arrange for follow-up with Geraldo Pitter, NP in 6 to 8 weeks.  Lease get your antibiotic filled for your sinus infection.  Call sooner should you have any difficulties prior to your follow-up appointment.

## 2021-12-04 NOTE — Progress Notes (Signed)
Subjective:    Patient ID: Melinda Stone, female    DOB: 1980/04/17, 41 y.o.   MRN: [id number] Patient Care Team: Lauretta Grill, NP as PCP - General (Nurse Practitioner)  Chief Complaint  Patient presents with   Follow-up    She c/o cough with yellow sputum, sinus drainage over the past wk- PCP prescribed abx but she has not started yet.    HPI Patient is a 41 year old occasional smoker (cigars) who follows here for the issue of dyspnea and cough.  Her initial visit here was in April and at that time she was noted to have significant bronchospasm.  On subsequent visit in June with Derl Barrow, NP she was not noted to have any wheezing.  She had PFTs on 14 June that showed a mixed physiology.  She has mild obstructive airways disease and mild to moderate restriction noted as well.  There was moderate diffusion defect.  Her diffusion defect may be secondary to anemia as the DLCO was not corrected for this.  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  He has been evaluated by hematology and it was noted that she requires B12 supplementation.  The patient did not discontinue Advair as recommended on her last visit.  She has been continuing it and does note some relief from her dyspnea with the medication.  At her initial visit a 2D echo was obtained that showed grade 1 diastolic dysfunction, LVEF 60 to 65% and some mildly elevated right ventricular pressures.  An overnight oximetry was obtained on July 11 which showed some desaturations to 79% with some deep variations on the in the tracings.  She subsequently had a home sleep study that showed no obstructive sleep apnea and sats that maintained at 91% or better.  Suspect that her ONO was affected by use of narcotics and hypoventilation related to the same.  Patient notes that her cough has resolved.  She has had issues with sinus congestion and purulent nasal discharge and is to have a prescription for antibiotics picked up today,  this was prescribed by her primary care practitioner.  She has not had any fevers, chills or sweats.  No chest pain, no tachypalpitations lately.  No orthopnea or paroxysmal nocturnal dyspnea.  No lower extremity edema or calf tenderness.   Review of Systems A 10 point review of systems was performed and it is as noted above otherwise negative.  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   Chronic pain syndrome 06/14/2020   Pharmacologic therapy 06/14/2020   Disorder of skeletal system 06/14/2020   Problems influencing health status 06/14/2020   History of marijuana use 98/92/1194   History of illicit drug use 17/40/8144   Abnormal drug screen (05/25/2020) 06/14/2020   Marijuana use 06/14/2020   Abnormal MRI, lumbar spine (05/16/2020) 06/14/2020   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, L5-S1) 06/14/2020   Lumbar lateral recess stenosis (Left: L4-5, L5-S1) 06/14/2020   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) 06/14/2020   S/P lumbar microdiscectomy 06/07/2020   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   GI bleed 12/01/2019   Muscle spasm 11/06/2019   Sciatica  11/06/2019   Low back pain 02/04/2019   Type 2 diabetes mellitus with diabetic polyneuropathy, without long-term current use of insulin ([place]) 12/03/2018   Type 2 diabetes mellitus with hyperglycemia, without long-term current use of insulin ([place]) 05/15/2018   Social History   Tobacco Use   Smoking status: Some Days    Types: Cigars   Smokeless tobacco: Never   Tobacco comments:    black and milds-- 5 cigars per week-09/01/2021   Substance Use Topics   Alcohol use: Not Currently   No Known Allergies  Current Meds  Medication Sig   chlorhexidine (PERIDEX) 0.12 % solution Use as directed 5 mLs in the mouth or throat 2 (two) times daily.   dicyclomine (BENTYL) 20 MG tablet Take 1 tablet (20 mg total) by mouth 2 (two) times daily.   fluticasone-salmeterol (ADVAIR DISKUS) 250-50 MCG/ACT AEPB Inhale 1 puff into the lungs in the morning and at bedtime.   gabapentin (NEURONTIN) 800 MG tablet Take 800 mg by mouth in the morning, at noon, in the evening, and at bedtime.   glipiZIDE (GLUCOTROL) 10 MG tablet Take 2 tablets (20 mg total) by mouth daily before breakfast AND 1 tablet (10 mg total) daily before supper.   hydrOXYzine (VISTARIL) 50 MG capsule Take 1 capsule (50 mg total) by mouth 3 (three) times daily as needed.   ondansetron (ZOFRAN-ODT) 4 MG disintegrating tablet Take 1 tablet (4 mg total) by mouth every 8 (eight) hours as needed.   oxyCODONE-acetaminophen (PERCOCET/ROXICET) 5-325 MG tablet Take 1 tablet by mouth every 6 (six) hours as needed for moderate pain or severe pain.   pantoprazole (PROTONIX) 40 MG tablet Take 1 tablet (40 mg total) by mouth daily.   rizatriptan (MAXALT-MLT) 10 MG disintegrating tablet Take 1 tablet (10 mg total) by mouth as needed for migraine. May repeat in 2 hours if needed   tiZANidine (ZANAFLEX) 4 MG tablet Take 4 mg by mouth in the morning, at noon, in the evening, and at bedtime.   topiramate (TOPAMAX) 50 MG tablet Take 1 tablet (50 mg total) by mouth 2 (two) times daily.   VENTOLIN HFA 108 (90 Base) MCG/ACT inhaler 2 puffs every 6 (six) hours as needed for wheezing or shortness of breath.   Immunization History  Administered Date(s) Administered   Janssen (J&J) SARS-COV-2 Vaccination 08/25/2019   Pneumococcal Polysaccharide-23 05/09/2020       Objective:   Physical Exam BP 102/60 (BP Location: Left Arm, Cuff Size: Normal)   Pulse 80   Temp 97.9 F (36.6 C) (Oral)   Ht 5'  9" (1.753 m)   Wt 182 lb (82.6 kg)   SpO2 100%   BMI 26.88 kg/m  GENERAL: Well-developed overweight woman, no acute distress, no conversational dyspnea. HEAD: Normocephalic, atraumatic.  EYES: Pupils equal, round, reactive to light.  No scleral icterus.  MOUTH: Edentulous, oral mucosa moist.  No thrush. NECK: Supple. No thyromegaly. Trachea midline. No JVD.  No adenopathy. PULMONARY: Good air entry bilaterally.  No adventitious sounds noted. CARDIOVASCULAR: S1 and S2.  Regular rate and rhythm.  No rubs murmurs gallops heard. ABDOMEN: Benign. MUSCULOSKELETAL: No joint deformity, no clubbing, no edema.  NEUROLOGIC: No overt focal deficit, no gait disturbance, speech is fluent. SKIN: Intact,warm,dry. PSYCH: Flat affect.      Assessment & Plan:     ICD-10-CM   1. Shortness of breath  R06.02    Query related to use of narcotics Pulmonary function testing with mixed pattern Restrictive physiology as well as  obstructive    2. Mild persistent asthma without complication  A999333    Continue Advair 250/50 1 inhalation twice a day Continue as needed albuterol    3. Abnormal PFT  R94.2 CT Chest High Resolution   Obstructive and restrictive physiology Abnormal DLCO, may be related to anemia High-resolution CT chest    4. Microcytic anemia  D50.9    Being followed by hematology This issue adds complexity to her management     Orders Placed This Encounter  Procedures   CT Chest High Resolution    Needs to be done now, and needs ov with Beth in [place] after to go over results please UHC/MCD epic WT 182 No special needs/No spinal cord stimulator/body injector/glucose/heart/port attach Pansy with Anita @ [phone]    Standing Status:   Future    Standing Expiration Date:   12/05/2022    Order Specific Question:   Is patient pregnant?    Answer:   No    Order Specific Question:   Preferred imaging location?    Answer:   [facility]   Patient resides in [place].  She  was unaware that we had a clinic in that area.  She will be set up with follow-up with our [facility] clinic.  We will obtain a high-resolution CT to exclude potential parenchymal disease as a source for her restrictive physiology on PFTs.  Her care is complicated by her chronic use of narcotics for chronic pain.  She needs to continue to follow-up with pain clinic.  Patient will be scheduled at the [facility] office in 6 to 8 weeks time.  She is to contact our practice as needed prior to that.  Renold Don, MD Advanced Bronchoscopy PCCM [facility]    *This note was dictated using voice recognition software/Dragon.  Despite best efforts to proofread, errors can occur which can change the meaning. Any transcriptional errors that result from this process are unintentional and may not be fully corrected at the time of dictation.

## 2021-12-08 ENCOUNTER — Ambulatory Visit (INDEPENDENT_AMBULATORY_CARE_PROVIDER_SITE_OTHER): Payer: Medicaid Other | Admitting: Specialist

## 2021-12-08 ENCOUNTER — Encounter: Payer: Self-pay | Admitting: Specialist

## 2021-12-08 VITALS — BP 95/67 | HR 94 | Ht 69.0 in | Wt 189.0 lb

## 2021-12-08 DIAGNOSIS — M5126 Other intervertebral disc displacement, lumbar region: Secondary | ICD-10-CM

## 2021-12-08 DIAGNOSIS — M5136 Other intervertebral disc degeneration, lumbar region: Secondary | ICD-10-CM

## 2021-12-08 DIAGNOSIS — M961 Postlaminectomy syndrome, not elsewhere classified: Secondary | ICD-10-CM

## 2021-12-08 DIAGNOSIS — M48062 Spinal stenosis, lumbar region with neurogenic claudication: Secondary | ICD-10-CM | POA: Diagnosis not present

## 2021-12-08 NOTE — Patient Instructions (Signed)
Plan: Avoid frequent bending and stooping  No lifting greater than 10 lbs. May use ice or moist heat for pain. Weight loss is of benefit. Best medication for lumbar disc disease is arthritis medications like motrin, celebrex and naprosyn. Exercise is important to improve your indurance and does allow people to function better inspite of back pain. Your studies show no sign of infection, there is severe degenerative disc disease L4-5 and L5-S1 with disc  Bulge and some protrusion. In the presence of severe degenerative disc changes I recommend you consider having a lumbar fusion at both L4-5 and L5-S1. This will improve your pain.Expect relief of the leg pain and improve ability to stand and to walk.  I am not able to schedule surgery and I am referring you to Dr. Laurance Flatten our spine specialist to be seen and  Allowed to be schedule for intervention.

## 2021-12-08 NOTE — Progress Notes (Signed)
Office Visit Note   Patient: Melinda Stone           Date of Birth: January 13, 1981           MRN: [id number] Visit Date: 12/08/2021              Requested by: Anselm Jungling, NP [address] [address] [place],  [place] [address] PCP: Anselm Jungling, NP   Assessment & Plan: Visit Diagnoses:  1. Degenerative disc disease, lumbar   2. Discitis of lumbar region   3. 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 Post laminectomy syndrome   5. Lumbar disc herniation   6. Recurrent herniation of lumbar disc     Plan: Avoid frequent bending and stooping  No lifting greater than 10 lbs. May use ice or moist heat for pain. Weight loss is of benefit. Best medication for lumbar disc disease is arthritis medications like motrin, celebrex and naprosyn. Exercise is important to improve your indurance and does allow people to function better inspite of back pain. Your studies show no sign of infection, there is severe degenerative disc disease L4-5 and L5-S1 with disc  Bulge and some protrusion. In the presence of severe degenerative disc changes I recommend you consider having a lumbar fusion at both L4-5 and L5-S1. This will improve your pain.Expect relief of the leg pain and improve ability to stand and to walk.  I am not able to schedule surgery and I am referring you to Dr. Christell Constant our spine specialist to be seen and  Allowed to be schedule for intervention.   Follow-Up Instructions: No follow-ups on file.   Orders:  No orders of the defined types were placed in this encounter.  No orders of the defined types were placed in this encounter.     Procedures: No procedures performed   Clinical Data: No additional findings.   Subjective: Chief Complaint  Patient presents with   Lower Back - Follow-up, Pain    41 year old female with history of back and leg pain with leg numbness into both feet. She has been experiencing pain for over 3 years.     Review of  Systems  Constitutional: Negative.   HENT: Negative.    Eyes: Negative.   Respiratory: Negative.    Cardiovascular: Negative.   Gastrointestinal: Negative.   Endocrine: Negative.   Genitourinary: Negative.   Musculoskeletal: Negative.   Skin: Negative.   Allergic/Immunologic: Negative.   Neurological: Negative.   Hematological: Negative.   Psychiatric/Behavioral: Negative.       Objective: Vital Signs: BP 95/67 (BP Location: Left Arm, Patient Position: Sitting)   Pulse 94   Ht 5\' 9"  (1.753 m)   Wt 189 lb (85.7 kg)   BMI 27.91 kg/m   Physical Exam Constitutional:      Appearance: She is well-developed.  HENT:     Head: Normocephalic and atraumatic.  Eyes:     Pupils: Pupils are equal, round, and reactive to light.  Pulmonary:     Effort: Pulmonary effort is normal.     Breath sounds: Normal breath sounds.  Abdominal:     General: Bowel sounds are normal.     Palpations: Abdomen is soft.  Musculoskeletal:     Cervical back: Normal range of motion and neck supple.     Lumbar back: Negative right straight leg raise test and negative left straight leg raise test.  Skin:    General: Skin is warm and dry.  Neurological:  Mental Status: She is alert and oriented to person, place, and time.  Psychiatric:        Behavior: Behavior normal.        Thought Content: Thought content normal.        Judgment: Judgment normal.     Back Exam   Tenderness  The patient is experiencing tenderness in the lumbar.  Range of Motion  Extension:  abnormal  Flexion:  abnormal  Lateral bend right:  normal  Lateral bend left:  normal  Rotation right:  normal  Rotation left:  normal   Tests  Straight leg raise right: negative Straight leg raise left: negative  Other  Toe walk: normal Heel walk: normal      Specialty Comments:  No specialty comments available.  Imaging: No results found.   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   Chronic pain syndrome 06/14/2020   Pharmacologic therapy 06/14/2020   Disorder of skeletal system 06/14/2020   Problems influencing health status 06/14/2020   History of marijuana use 06/14/2020   History of illicit drug use 06/14/2020   Abnormal drug screen (05/25/2020) 06/14/2020   Marijuana use 06/14/2020   Abnormal MRI, lumbar spine (05/16/2020) 06/14/2020   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, L5-S1) 06/14/2020   Lumbar lateral recess stenosis (Left: L4-5, L5-S1) 06/14/2020   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) 06/14/2020   S/P lumbar microdiscectomy 06/07/2020   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   GI bleed 12/01/2019   Muscle spasm 11/06/2019   Sciatica 11/06/2019   Low back pain 02/04/2019   Type 2 diabetes mellitus with diabetic polyneuropathy, without long-term current use of insulin (HCC) 12/03/2018   Type 2 diabetes mellitus with hyperglycemia, without long-term current use of insulin (HCC) 05/15/2018   Past Medical History:  Diagnosis Date   Anemia    Arthritis 08/17/2020   Chronic back pain    Diabetes mellitus without complication (HCC)    type 2   Migraine    Ovarian cyst    Pneumonia    Polyneuropathy     Family History  Problem Relation Age of Onset   Diabetes Mother    Hypertension Mother    Stroke Mother     Past Surgical History:  Procedure Laterality Date   CESAREAN SECTION     CHOLECYSTECTOMY  2004   LUMBAR LAMINECTOMY N/A 05/30/2020   Procedure: Lumbar five Laminectomy,  Bilateral Microdiscectomy, Left Lumbar five -Sacral one Microdiscectomy;  Surgeon:  [name], MD;  Location: [facility] OR;  Service: Orthopedics;  Laterality: N/A;   LUMBAR LAMINECTOMY Left 11/14/2020   Procedure: LEFT LUMBAR FOUR-FIVE MICRODISCECTOMY;  Surgeon: [name], MD;  Location: [facility] OR;  Service: Orthopedics;  Laterality: Left;   TUBAL LIGATION  10/19/2010   Social History   Occupational History    Comment: home maker  Tobacco Use   Smoking status: Some Days    Types: Cigars   Smokeless tobacco: Never   Tobacco comments:    black and milds-- 5 cigars per week-09/01/2021  Vaping Use   Vaping Use: Never used  Substance and Sexual Activity  Alcohol use: Not Currently   Drug use: Not Currently    Types: Marijuana    Comment: per patient stopped smoking marijuana Mar 2022   Sexual activity: Yes    Birth control/protection: None

## 2021-12-09 NOTE — Progress Notes (Signed)
41 year old female returns.  She is 1 week status post left L4-5 microdiscectomy.  Still has some soreness as to be expected.  Taking oxycodone and muscle relaxer.   Exam Wound looks good staples intact.  No drainage or signs of infection.    Plan Patient follow-up in the office in a few days for wound check and possible staple removal.  Must avoid bending twisting lifting.

## 2021-12-21 ENCOUNTER — Ambulatory Visit (INDEPENDENT_AMBULATORY_CARE_PROVIDER_SITE_OTHER): Payer: Medicaid Other

## 2021-12-21 ENCOUNTER — Ambulatory Visit (INDEPENDENT_AMBULATORY_CARE_PROVIDER_SITE_OTHER): Payer: Medicaid Other | Admitting: Orthopedic Surgery

## 2021-12-21 ENCOUNTER — Encounter: Payer: Self-pay | Admitting: Orthopedic Surgery

## 2021-12-21 VITALS — BP 108/73 | HR 93 | Ht 69.0 in | Wt 186.0 lb

## 2021-12-21 DIAGNOSIS — M5136 Other intervertebral disc degeneration, lumbar region: Secondary | ICD-10-CM | POA: Diagnosis not present

## 2021-12-21 NOTE — Progress Notes (Signed)
Orthopedic Spine Surgery Office Note  Assessment: Patient is a 41 y.o. female with low back and bilateral leg pain.  There is a component of L4 and L5 radiculopathy on the left side with imaging showing stenosis at those levels.  However, there is also pain in areas that are not explained by her most recent MRI.   Plan: -Explained that initially conservative treatment is tried as a significant number of patients may experience relief with these treatment modalities. Discussed that the conservative treatments include:  -activity modification  -physical therapy  -over the counter pain medications  -medrol dosepak  -lumbar steroid injections -Patient has tried PT, OTC pain medications, back bracing, prior surgery, activity modification, injections  -She has exhausted all conservative treatments so discussed operative management as a treatment option -Discussed operative management in the form of L5/S1 laminectomy, L5/S1 discectomy, L5/S1 left-sided facetectomy, L4/5 foraminotomy, L4-S1 posterior instrumented spinal fusion -Explained that this surgery will not help with all of her pain especially her back pain. The goal would be to help her leg pain especially on the left side. She expressed understanding with the fact that there will likely be residual pain after this surgery and she stated that would be happy if even some of her pain was alleviated with a surgery.  -Given the stenosis and disc herniation into the foramen at L5/S1 and the additional amount of facet I plan on removing to decompress, I recommended instrumented fusion -She is a smoker so I told her she needed to be nicotine free (no nicotine gum/patches, vaping, cigarettes, etc.) at least 8 weeks before surgery. I also explained that she would need to get a UDS and urine nicotine test to confirm that she was nicotine free. She understood that plan and will work on quitting -Her last A1C was on 10/11/21 and was 5.3, her BMI is 27 -Patient  will next be seen at date of surgery   The patient has symptoms consistent with lumbar stenosis. The patient's symptoms were not getting improvement with conservative treatment so operative management was discussed in the form of L5/S1 laminectomy, L5/S1 discectomy, L5/S1 left-sided facetectomy, L4/5 foraminotomy, L4-S1 posterior instrumented spinal fusion. The risks including but not limited to dural tear, nerve root injury, paralysis, persistent pain, pseudarthrosis, infection, bleeding, hardware failure, adjacent segment disease, death, fracture, and need for additional procedures were discussed with the patient. The benefit of the surgery would be relief of the patient's leg symptoms particularly on the left side. The alternatives to surgical management were covered with the patient and included continued monitoring, physical therapy, over-the-counter pain medications, ambulatory aids, and activity modification. All the patient's questions were answered to their satisfaction. After this discussion, the patient expressed understanding and elected to proceed with surgical intervention.     ___________________________________________________________________________   History:  Patient is a 41 y.o. female who presents today for lumbar spine.  Patient has had several years (about 2) of bilateral leg and low back pain.  She states that there is no trauma or injury that brought on the pain.  She states the pain starts in her lower back and goes into her bilateral legs.  She feels it mostly down the back and legs but can feel it pretty much anywhere in the legs.  She feels it goes all the way down into her foot on both the dorsal and plantar aspect.  She gets paresthesias and numbness in a similar distribution as the pain.  She has gotten temporary relief with injections and her  prior surgeries but the pain always seems to return.  The pain is gone to the point that its interfering with her daily life and she  feels like she cannot even do things as simple as wash the dishes without significant pain.  She feels that her legs have gotten weaker with time as well.  She has not noticed any imbalance.  No clumsiness with her hands.  No trouble with fine motor skills in her hands.  Her worst areas of pain are her lower back and her left leg.  The right leg is much less significant than the left leg.   Weakness: Yes, feels that her legs are weaker Symptoms of imbalance: Denies Paresthesias and numbness: Yes, as described above Bowel or bladder incontinence: Denies Saddle anesthesia: Denies  Treatments tried: PT, home exercises, over-the-counter pain medications, surgery, injections  Review of systems: Denies fevers and chills, night sweats, unexplained weight loss, history of cancer, pain that wakes them at night  Past medical history: Diabetes Hypotension Chronic pain  Allergies: NKDA  Past surgical history:  C-section Cholecystectomy Tubal ligation Reexploration left L4-5 with removal of disc fragment, microdiscectomy (11/14/2020-[facility]) L4 laminectomy including bilateral partial facetectomy, left 5 hemilaminectomy, bilateral microdiscectomy at L4-5 and left L5-S1 (05/30/2020-[facility])  Social history: Reports use of nicotine product (smoking, vaping, patches, smokeless) Alcohol use: Denies Denies recreational drug use   Physical Exam:  General: no acute distress, appears stated age Neurologic: alert, answering questions appropriately, following commands Respiratory: unlabored breathing on room air, symmetric chest rise Psychiatric: appropriate affect, normal cadence to speech   MSK (spine):  -Strength exam      Left  Right EHL    3/5  4/5 TA    4/5  4/5 GSC    4/5  4/5 Knee extension  5/5  5/5 Hip flexion   4/5  4/5  Hip flexion strength testing limited by pain Has cogwheel weakness with ankle and EHL strength testing bilaterally  -Sensory exam    Sensation intact to light  touch in L3-S1 nerve distributions of bilateral lower extremities (decreased in all distributions bilaterally)  -Achilles DTR: 2/4 on the left, 2/4 on the right -Patellar tendon DTR: 2/4 on the left, 2/4 on the right  -Straight leg raise: negative -Contralateral straight leg raise: negative -Femoral nerve stretch test: negative -Clonus: no beats bilaterally -TTP over her lower thoracic and entire lumbar spine  -On inspection, her spine appears well balanced in both the coronal and sagittal plane  -Left hip exam: negative stinchfield, no pain through range of motion, no pain with log roll, negative FABER -Right hip exam: negative stinchfield, no pain through range of motion, no pain with log roll, negative FABER  Imaging: XR of the lumbar spine from 12/21/2021, 09/13/2021 was independently reviewed and interpreted, showing disc height loss at L4-5 and L5-S1.  There is sclerosis of the superior endplate of L5 and inferior endplate of L4.  Retrolisthesis of L3 on L4.  No evidence of instability.  PI = 54, LL = 50.  CT of the abdomen pelvis from 06/09/2021 shows near complete removal of the inferior facet of L5, laminotomy defect at L5-S1, subtle lucency in the pars on the left at L4-5 laminectomy defect at L4-5  MRI of the lumbar spine from 09/24/2021 was independently reviewed and interpreted, showing degenerative disc disease with Modic changes at L4-5, degenerative disc disease at L5-S1.  Prior surgical defects at L4-5 and L5-S1 as described above in the CT interpretation.  Left-sided foraminal stenosis  at L4-5, lateral recess stenosis and left sided foraminal stenosis at L5-S1.  Large broad-based mostly left-sided disc herniation at L5-S1.   Patient name: Melinda Stone Patient MRN: [id number] Date of visit: 12/21/21

## 2021-12-26 ENCOUNTER — Ambulatory Visit (INDEPENDENT_AMBULATORY_CARE_PROVIDER_SITE_OTHER): Payer: Medicaid Other | Admitting: Diagnostic Neuroimaging

## 2021-12-26 ENCOUNTER — Encounter: Payer: Self-pay | Admitting: Diagnostic Neuroimaging

## 2021-12-26 VITALS — BP 111/78 | HR 96 | Ht 69.0 in | Wt 192.2 lb

## 2021-12-26 DIAGNOSIS — G43109 Migraine with aura, not intractable, without status migrainosus: Secondary | ICD-10-CM

## 2021-12-26 DIAGNOSIS — M62838 Other muscle spasm: Secondary | ICD-10-CM | POA: Diagnosis not present

## 2021-12-26 DIAGNOSIS — R200 Anesthesia of skin: Secondary | ICD-10-CM | POA: Diagnosis not present

## 2021-12-26 DIAGNOSIS — G894 Chronic pain syndrome: Secondary | ICD-10-CM

## 2021-12-26 MED ORDER — AMITRIPTYLINE HCL 25 MG PO TABS: 25.0000 mg | ORAL_TABLET | Freq: Every day | ORAL | 3 refills | Status: DC

## 2021-12-26 NOTE — Progress Notes (Signed)
[facility]  PATIENT: Melinda Stone DOB: 17-Dec-1980  REFERRING CLINICIAN: Anselm Jungling, NP HISTORY FROM: patient  REASON FOR VISIT: follow up   HISTORICAL  CHIEF COMPLAINT:  Chief Complaint  Patient presents with   Follow-up    Pt reports feeling okay. Migraines are still happening. Room 6 alone    HISTORY OF PRESENT ILLNESS:   UPDATE (06/20/21, [facility]): Since last visit, now with migraine x 2 months. Similar HA in her 20's. Gen throbbing, nausea, sens to light and sound HA. Now 3-4 per week.  PRIOR HPI (11/07/20): 41 year old female with type 2 diabetes and chronic low back pain here for evaluation of lower extremity pain and numbness.  Patient has had diabetes since age 39 years old.  She has had lower extremity numbness and tingling for past 3 to 4 years.  She is also been diagnosed with lumbar spinal stenosis and degenerative changes, status post surgery in March 2022.  Follow-up postoperative imaging demonstrates residual spinal stenosis and radiculopathy changes and she is planning to have additional surgery in the next few weeks.  Patient has been under care of pain management in the past.  She has tried gabapentin, Lyrica, Cymbalta, Robaxin, hydrocodone and tramadol in the past.   REVIEW OF SYSTEMS: Full 14 system review of systems performed and negative with exception of: as per HPI.  ALLERGIES: No Known Allergies  HOME MEDICATIONS: Outpatient Medications Prior to Visit  Medication Sig Dispense Refill   dicyclomine (BENTYL) 20 MG tablet Take 1 tablet (20 mg total) by mouth 2 (two) times daily. 20 tablet 0   gabapentin (NEURONTIN) 800 MG tablet Take 800 mg by mouth in the morning, at noon, in the evening, and at bedtime.     glipiZIDE (GLUCOTROL) 10 MG tablet Take 2 tablets (20 mg total) by mouth daily before breakfast AND 1 tablet (10 mg total) daily before supper. 270 tablet 3   ondansetron (ZOFRAN-ODT) 4 MG disintegrating tablet Take 1  tablet (4 mg total) by mouth every 8 (eight) hours as needed. 20 tablet 0   oxyCODONE-acetaminophen (PERCOCET/ROXICET) 5-325 MG tablet Take 1 tablet by mouth every 6 (six) hours as needed for moderate pain or severe pain.     pantoprazole (PROTONIX) 40 MG tablet Take 1 tablet (40 mg total) by mouth daily. 30 tablet 0   tiZANidine (ZANAFLEX) 4 MG tablet Take 4 mg by mouth in the morning, at noon, in the evening, and at bedtime.     VENTOLIN HFA 108 (90 Base) MCG/ACT inhaler 2 puffs every 6 (six) hours as needed for wheezing or shortness of breath.     rizatriptan (MAXALT-MLT) 10 MG disintegrating tablet Take 1 tablet (10 mg total) by mouth as needed for migraine. May repeat in 2 hours if needed 9 tablet 11   topiramate (TOPAMAX) 50 MG tablet Take 1 tablet (50 mg total) by mouth 2 (two) times daily. 60 tablet 12   chlorhexidine (PERIDEX) 0.12 % solution Use as directed 5 mLs in the mouth or throat 2 (two) times daily. (Patient not taking: Reported on 12/26/2021)     fluticasone-salmeterol (ADVAIR DISKUS) 250-50 MCG/ACT AEPB Inhale 1 puff into the lungs in the morning and at bedtime. (Patient not taking: Reported on 12/26/2021) 60 each 5   hydrOXYzine (VISTARIL) 50 MG capsule Take 1 capsule (50 mg total) by mouth 3 (three) times daily as needed. (Patient not taking: Reported on 12/26/2021) 40 capsule 0   No facility-administered medications prior to visit.  PHYSICAL EXAM  GENERAL EXAM/CONSTITUTIONAL: Vitals:  Vitals:   12/26/21 1401  BP: 111/78  Pulse: 96  Weight: 192 lb 4 oz (87.2 kg)  Height: 5\' 9"  (1.753 m)   Body mass index is 28.39 kg/m. Wt Readings from Last 3 Encounters:  12/26/21 192 lb 4 oz (87.2 kg)  12/21/21 186 lb (84.4 kg)  12/08/21 189 lb (85.7 kg)   Patient is in no distress; well developed, nourished and groomed; neck is supple  CARDIOVASCULAR: Examination of carotid arteries is normal; no carotid bruits Regular rate and rhythm, no murmurs Examination of  peripheral vascular system by observation and palpation is normal  EYES: Ophthalmoscopic exam of optic discs and posterior segments is normal; no papilledema or hemorrhages No results found.  MUSCULOSKELETAL: Gait, strength, tone, movements noted in Neurologic exam below  NEUROLOGIC: MENTAL STATUS:      No data to display         awake, alert, oriented to person, place and time recent and remote memory intact normal attention and concentration language fluent, comprehension intact, naming intact fund of knowledge appropriate  CRANIAL NERVE:  2nd - no papilledema on fundoscopic exam 2nd, 3rd, 4th, 6th - pupils equal and reactive to light, visual fields full to confrontation, extraocular muscles intact, no nystagmus 5th - facial sensation symmetric 7th - facial strength symmetric 8th - hearing intact 9th - palate elevates symmetrically, uvula midline 11th - shoulder shrug symmetric 12th - tongue protrusion midline  MOTOR:  normal bulk and tone, full strength in the BUE, BLE  SENSORY:  normal and symmetric to light touch, pinprick, temperature, vibration; EXCEPT DECR IN FEET  COORDINATION:  finger-nose-finger, fine finger movements normal  REFLEXES:  deep tendon reflexes TRACE and symmetric  GAIT/STATION:  narrow based gait; ANTALGIC GAIT     DIAGNOSTIC DATA (LABS, IMAGING, TESTING) - I reviewed patient records, labs, notes, testing and imaging myself where available.  Lab Results  Component Value Date   WBC 6.2 10/31/2021   HGB 11.6 (L) 10/31/2021   HCT 33.1 (L) 10/31/2021   MCV 83.8 10/31/2021   PLT 167 10/31/2021      Component Value Date/Time   NA 139 10/31/2021 1646   NA 136 04/28/2020 1124   K 3.6 10/31/2021 1646   CL 114 (H) 10/31/2021 1646   CO2 19 (L) 10/31/2021 1646   GLUCOSE 90 10/31/2021 1646   BUN 9 10/31/2021 1646   BUN 4 (L) 04/28/2020 1124   CREATININE 0.50 10/31/2021 1646   CALCIUM 8.6 (L) 10/31/2021 1646   PROT 6.7 10/31/2021  1646   PROT 6.3 04/28/2020 1124   ALBUMIN 3.9 10/31/2021 1646   ALBUMIN 4.0 04/28/2020 1124   AST 17 10/31/2021 1646   ALT 16 10/31/2021 1646   ALKPHOS 77 10/31/2021 1646   BILITOT 1.1 10/31/2021 1646   BILITOT 1.7 (H) 04/28/2020 1124   GFRNONAA >60 10/31/2021 1646   GFRAA 139 04/28/2020 1124   Lab Results  Component Value Date   CHOL 96 (L) 04/28/2020   HDL 55 04/28/2020   LDLCALC 23 04/28/2020   TRIG 97 04/28/2020   CHOLHDL 1.7 04/28/2020   Lab Results  Component Value Date   HGBA1C 5.3 10/11/2021   Lab Results  Component Value Date   VITAMINB12 224 09/11/2021   Lab Results  Component Value Date   TSH 0.657 09/11/2021    10/02/20 MRI lumbar spine  1. Postoperative changes from prior decompressive laminectomy with micro discectomy at L4-5. Residual and/or recurrent left subarticular disc  protrusion at this level with resultant moderate canal and severe left lateral recess stenosis. 2. Sequelae of prior left hemi laminectomy with micro discectomy at L5-S1 with small residual and/or recurrent left subarticular to foraminal disc protrusion as above.  06/08/21 CT head  - No acute intracranial abnormality noted.   ASSESSMENT AND PLAN  41 y.o. year old female here with:  Dx:  1. Migraine with aura and without status migrainosus, not intractable      PLAN:  MIGRAINE WITH AURA  MIGRAINE TREATMENT PLAN:  MIGRAINE PREVENTION  LIFESTYLE CHANGES -Stop or avoid smoking -Decrease or avoid caffeine / alcohol -Eat and sleep on a regular schedule -Exercise several times per week - stop topiramate (not working) - start amitriptyline 25mg  at bedtime  MIGRAINE RESCUE  - ibuprofen, tylenol as needed - stop rizatriptan (not working)  GENERAL PAIN (back, legs, body; numbness; muscle spasm) - check MRI brain (rule out demyelinating disease) - continue gabapentin, oxycodone, tizanidine per pain mgmt   Orders Placed This Encounter  Procedures   MR BRAIN W WO  CONTRAST   Meds ordered this encounter  Medications   amitriptyline (ELAVIL) 25 MG tablet    Sig: Take 1 tablet (25 mg total) by mouth at bedtime.    Dispense:  30 tablet    Refill:  3   Return in about 6 months (around 06/27/2022), or with NP.    Penni Bombard, MD [id number], [id number] PM Certified in Neurology, Neurophysiology and [facility] [address] [place], [place] [address] [phone]

## 2021-12-26 NOTE — Patient Instructions (Addendum)
-   stop topiramate (not working)  - stop rizatriptan (not working)  - start amitriptyline 25mg  at bedtime  - check MRI brain

## 2021-12-27 ENCOUNTER — Ambulatory Visit
Admission: RE | Admit: 2021-12-27 | Discharge: 2021-12-27 | Disposition: A | Discharge status: Home / Self Care | Payer: Medicaid Other | Source: Ambulatory Visit | Attending: Pulmonary Disease | Admitting: Pulmonary Disease

## 2021-12-27 ENCOUNTER — Telehealth: Payer: Self-pay | Admitting: Diagnostic Neuroimaging

## 2021-12-27 DIAGNOSIS — R942 Abnormal results of pulmonary function studies: Secondary | ICD-10-CM

## 2021-12-27 NOTE — Telephone Encounter (Signed)
[employer] medicaid Josem Kaufmann: [id number] exp. 12/27/21-02/10/22 sent to GI [phone]

## 2022-01-03 ENCOUNTER — Encounter (HOSPITAL_COMMUNITY): Payer: Self-pay | Admitting: Physician Assistant

## 2022-01-03 NOTE — Addendum Note (Signed)
Addended by: Ileene Rubens on: 01/03/2022 09:54 AM   Modules accepted: Orders

## 2022-01-15 ENCOUNTER — Encounter: Payer: Self-pay | Admitting: Primary Care

## 2022-01-15 ENCOUNTER — Ambulatory Visit (INDEPENDENT_AMBULATORY_CARE_PROVIDER_SITE_OTHER): Payer: Medicaid Other | Admitting: Primary Care

## 2022-01-15 DIAGNOSIS — R918 Other nonspecific abnormal finding of lung field: Secondary | ICD-10-CM | POA: Diagnosis not present

## 2022-01-15 DIAGNOSIS — J4520 Mild intermittent asthma, uncomplicated: Secondary | ICD-10-CM | POA: Insufficient documentation

## 2022-01-15 DIAGNOSIS — D509 Iron deficiency anemia, unspecified: Secondary | ICD-10-CM

## 2022-01-15 DIAGNOSIS — K219 Gastro-esophageal reflux disease without esophagitis: Secondary | ICD-10-CM | POA: Insufficient documentation

## 2022-01-15 NOTE — Assessment & Plan Note (Signed)
Following with hematology.

## 2022-01-15 NOTE — Progress Notes (Signed)
@Patient  ID: , female    DOB: 10-Feb-1981, 41 y.o.   MRN: [id number]  Chief Complaint  Patient presents with   Follow-up    Referring provider: [id number], NP  HPI: 41 year old female, some day smoker.  Past medical history significant for type 2 diabetes, chronic pain syndrome, history of drug use.  Patient of Dr. 24, seen for initial consult on 07/04/2021 for shortness of breath.  Previous [facility]  09/01/2021 Patient presents today for 6 to 8-week follow-up.  She was felt to have mild intermittent asthma due to wheezing noted on exam.  Labs revealed chronic anemia and some mild allergies to dust mites.  Trelegy inhaler was denied by her insurance, preferred meds are Advair, Dulera and Symbicort. She was started on Advair 250/50 1 puff twice daily.   Main complaint is shortness of breath. This has been going on for the last 2-3 months. No improvement with either Advair or prn albuterol. She developed a cough in November 2022 that went away in March 2023. She gets tired and out of breath doing simple tasks such as shower, making her bed or doing the laundry. She needs to use a motor scooter while shopping. She smokes black and mild cigars 1-2 times a day. Blood pressure is on lower side today, she did take 1 Percocet tablet this morning. She has no active cough, chest tightness or wheezing.   01/15/2022 - Interim hx  Patient presents today for 6-8 week follow-up.  He was last seen in September by Dr. October for shortness of breath.  She was ordered for high-resolution CAT scan to exclude potential parenchymal disease as source of restrictive physiology on PFTs. CT chest in October 2023 showed no evidence of interstitial lung disease.  No active pulmonary disease. Previously described small ground-glass nodules in the peripheral left upper lobe on 06/03/2021 chest CT have resolved. Mild triangular thymic hyperplasia with internal speckled fat in the anterior  mediastinum is unchanged.  She has mild dyspnea symptoms when walking but nothing that limits her. Denies cough, chest tightness or wheezing. She is no longer on Advair as she did not find this beneficial. No recent SABA use. She has bad reflux symptoms causing her to vomit in the morning. She takes Protonix 40mg  daily. She has an upper endoscopy scheduled this week.   Pulmonary function testing 08/30/21 FVC 3.00 (81%), FEV1 2.23 (73%), ratio 74, TLC 70, DLCOunc 52%  No Known Allergies  Immunization History  Administered Date(s) Administered   Janssen (J&J) SARS-COV-2 Vaccination 08/25/2019   Pneumococcal Polysaccharide-23 05/09/2020    Past Medical History:  Diagnosis Date   Anemia    Arthritis 08/17/2020   Chronic back pain    Diabetes mellitus without complication (HCC)    type 2   Migraine    Ovarian cyst    Pneumonia    Polyneuropathy     Tobacco History: Social History   Tobacco Use  Smoking Status Former   Types: Cigars  Smokeless Tobacco Never  Tobacco Comments   Not smoking as of 12/23/21. Verified 01/15/22 LW   Counseling given: Not Answered Tobacco comments: Not smoking as of 12/23/21. Verified 01/15/22 LW   Outpatient Medications Prior to Visit  Medication Sig Dispense Refill   amitriptyline (ELAVIL) 25 MG tablet Take 1 tablet (25 mg total) by mouth at bedtime. 30 tablet 3   dicyclomine (BENTYL) 20 MG tablet Take 1 tablet (20 mg total) by mouth 2 (two) times daily. 20 tablet 0  gabapentin (NEURONTIN) 800 MG tablet Take 800 mg by mouth in the morning, at noon, in the evening, and at bedtime.     glipiZIDE (GLUCOTROL) 10 MG tablet Take 2 tablets (20 mg total) by mouth daily before breakfast AND 1 tablet (10 mg total) daily before supper. 270 tablet 3   ondansetron (ZOFRAN-ODT) 4 MG disintegrating tablet Take 1 tablet (4 mg total) by mouth every 8 (eight) hours as needed. 20 tablet 0   oxyCODONE-acetaminophen (PERCOCET/ROXICET) 5-325 MG tablet Take 1 tablet by  mouth every 6 (six) hours as needed for moderate pain or severe pain.     pantoprazole (PROTONIX) 40 MG tablet Take 1 tablet (40 mg total) by mouth daily. 30 tablet 0   tiZANidine (ZANAFLEX) 4 MG tablet Take 4 mg by mouth in the morning, at noon, in the evening, and at bedtime.     VENTOLIN HFA 108 (90 Base) MCG/ACT inhaler 2 puffs every 6 (six) hours as needed for wheezing or shortness of breath.     No facility-administered medications prior to visit.   Review of Systems  Review of Systems  Constitutional: Negative.   HENT: Negative.    Respiratory:  Negative for cough, chest tightness, shortness of breath and wheezing.   Cardiovascular: Negative.    Physical Exam  BP 122/78 (BP Location: Left Arm, Patient Position: Sitting, Cuff Size: Large)   Pulse (!) 104   Temp 97.7 F (36.5 C) (Oral)   Ht 5\' 9"  (1.753 m)   Wt 200 lb 6.4 oz (90.9 kg)   SpO2 100%   BMI 29.59 kg/m  Physical Exam Constitutional:      Appearance: Normal appearance.  HENT:     Head: Normocephalic and atraumatic.  Cardiovascular:     Rate and Rhythm: Normal rate and regular rhythm.  Pulmonary:     Effort: Pulmonary effort is normal.     Breath sounds: Normal breath sounds. No wheezing, rhonchi or rales.     Comments: CTA Skin:    General: Skin is warm and dry.  Neurological:     General: No focal deficit present.     Mental Status: She is alert and oriented to person, place, and time. Mental status is at baseline.  Psychiatric:        Mood and Affect: Mood normal.        Behavior: Behavior normal.        Thought Content: Thought content normal.        Judgment: Judgment normal.      Lab Results:  CBC    Component Value Date/Time   WBC 6.2 10/31/2021 1646   RBC 3.95 10/31/2021 1646   HGB 11.6 (L) 10/31/2021 1646   HGB 9.4 (L) 01/29/2019 1744   HCT 33.1 (L) 10/31/2021 1646   HCT 28.5 (L) 01/29/2019 1744   PLT 167 10/31/2021 1646   PLT 203 01/29/2019 1744   MCV 83.8 10/31/2021 1646   MCV  74 (L) 01/29/2019 1744   MCH 29.4 10/31/2021 1646   MCHC 35.0 10/31/2021 1646   RDW 20.4 (H) 10/31/2021 1646   RDW 19.0 (H) 01/29/2019 1744   LYMPHSABS 2.2 10/31/2021 1646   LYMPHSABS 2.0 10/20/2018 1002   MONOABS 0.5 10/31/2021 1646   EOSABS 0.3 10/31/2021 1646   EOSABS 0.1 10/20/2018 1002   BASOSABS 0.1 10/31/2021 1646   BASOSABS 0.1 10/20/2018 1002    BMET    Component Value Date/Time   NA 139 10/31/2021 1646   NA 136 04/28/2020 1124  K 3.6 10/31/2021 1646   CL 114 (H) 10/31/2021 1646   CO2 19 (L) 10/31/2021 1646   GLUCOSE 90 10/31/2021 1646   BUN 9 10/31/2021 1646   BUN 4 (L) 04/28/2020 1124   CREATININE 0.50 10/31/2021 1646   CALCIUM 8.6 (L) 10/31/2021 1646   GFRNONAA >60 10/31/2021 1646   GFRAA 139 04/28/2020 1124    BNP No results found for: "BNP"  ProBNP No results found for: "PROBNP"  Imaging: CT Chest High Resolution  Result Date: 12/28/2021 CLINICAL DATA:  Dyspnea. Recent pneumonia. Diabetes mellitus. Abnormal pulmonary function tests. EXAM: CT CHEST WITHOUT CONTRAST TECHNIQUE: Multidetector CT imaging of the chest was performed following the standard protocol without intravenous contrast. High resolution imaging of the lungs, as well as inspiratory and expiratory imaging, was performed. RADIATION DOSE REDUCTION: This exam was performed according to the departmental dose-optimization program which includes automated exposure control, adjustment of the mA and/or kV according to patient size and/or use of iterative reconstruction technique. COMPARISON:  06/03/2021 chest CT angiogram. FINDINGS: Cardiovascular: Normal heart size. No significant pericardial effusion/thickening. 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 Great vessels are otherwise normal in course and caliber. Mediastinum/Nodes: No significant thyroid nodules. Unremarkable esophagus. No pathologically enlarged axillary, mediastinal or  hilar lymph nodes, noting limited sensitivity for the detection of hilar adenopathy on this noncontrast study. Mild triangular thymic hyperplasia with internal speckled fat in the anterior mediastinum is unchanged. Lungs/Pleura: No pneumothorax. No pleural effusion. No acute consolidative airspace disease, lung masses or significant pulmonary nodules. No significant lobular air trapping or evidence of tracheobronchomalacia on the expiration sequence. No significant regions of subpleural reticulation, ground-glass opacity, traction bronchiectasis, architectural distortion, parenchymal banding or frank honeycombing. Previously described small ground-glass nodules in the peripheral left upper lobe on 06/03/2021 chest CT have resolved. Upper abdomen: Hypodense 1.4 cm posterior right liver focus (series 2/image 103), stable since 12/01/2019 chest CT angiogram, compatible with a benign lesion. Musculoskeletal:  No aggressive appearing focal osseous lesions. IMPRESSION: 1. No evidence of interstitial lung disease. No active pulmonary disease. 2. Aberrant right subclavian artery. 3. Stable mild thymic hyperplasia. Electronically Signed   By: Delbert Phenix M.D.   On: 12/28/2021 16:11     Assessment & Plan:   Pulmonary nodules - High resolution CT chest in October 2023 showed no evidence of interstitial lung disease.  Previously described small groundglass nodules in peripheral left upper lobe on CT imaging in March 2023 have resolved.   Mild intermittent asthma - Stable; No acute respiratory complaints. Not currently on scheduled bronchodilators. Pulmonary function testing in June 2023 showed mild restriction with moderate diffusion defect.  She had a high-resolution CAT scan in October 2023 that showed no evidence of interstitial lung disease. Continue prn Albuterol hfa two puffs q4-6 hours for shortness of breath/wheezing. Smoking cessation encouraged   Esophageal reflux - Patient has reflux symptoms despite  being on Protonix 40 mg daily.  She is following with GI and has upcoming EGD   Microcytic anemia - Following with hematology      Glenford Bayley, NP 01/15/2022

## 2022-01-15 NOTE — Assessment & Plan Note (Signed)
-   Patient has reflux symptoms despite being on Protonix 40 mg daily.  She is following with GI and has upcoming EGD

## 2022-01-15 NOTE — Assessment & Plan Note (Addendum)
-   Stable; No acute respiratory complaints. Not currently on scheduled bronchodilators. Pulmonary function testing in June 2023 showed mild restriction with moderate diffusion defect.  She had a high-resolution CAT scan in October 2023 that showed no evidence of interstitial lung disease. Continue prn Albuterol hfa two puffs q4-6 hours for shortness of breath/wheezing. Smoking cessation encouraged

## 2022-01-15 NOTE — Patient Instructions (Addendum)
CT of chest showed no evidence of interstitial lung disease.  Previously described small groundglass nodules in peripheral left upper lobe on CT imaging in March 2023 have resolved.   Recommendations: -Continue Protonix 40mg  daily  -Follow-up up with GI as scheduled  -Use Albuterol 2 puffs every 4-6 hours for breakthrough shortness of breath/wheezing  -Strongly encourage smoking cessation   Follow-up: 1 year with Dr. Patsey Berthold /or return sooner if you develop any worsening respiratory symptoms  Steps to Quit Smoking Smoking tobacco is the leading cause of preventable death. It can affect almost every organ in the body. Smoking puts you and those around you at risk for developing many serious chronic diseases. Quitting smoking can be very challenging. Do not get discouraged if you are not successful the first time. Some people need to make many attempts to quit before they achieve long-term success. Do your best to stick to your quit plan, and talk with your health care provider if you have any questions or concerns. How do I get ready to quit? When you decide to quit smoking, create a plan to help you succeed. Before you quit: Pick a date to quit. Set a date within the next 2 weeks to give you time to prepare. Write down the reasons why you are quitting. Keep this list in places where you will see it often. Tell your family, friends, and co-workers that you are quitting. Support from people you are close to can make quitting easier. Talk with your health care provider about your options for quitting smoking. Find out what treatment options are covered by your health insurance. Identify people, places, things, and activities that make you want to smoke (triggers). Avoid them. What first steps can I take to quit smoking? Throw away all cigarettes at home, at work, and in your car. Throw away smoking accessories, such as Scientist, research (medical). Clean your car. Make sure to empty the  ashtray. Clean your home, including curtains and carpets. What strategies can I use to quit smoking? Talk with your health care provider about combining strategies, such as taking medicines while you are also receiving in-person counseling. Using these two strategies together makes you more likely to succeed in quitting than if you used either strategy on its own. If you are pregnant or breastfeeding, talk with your health care provider about finding counseling or other support strategies to quit smoking. Do not take medicine to help you quit smoking unless your health care provider tells you to. Quit right away Quit smoking completely, instead of gradually reducing how much you smoke over a period of time. Stopping smoking right away may be more successful than gradually quitting. Attend in-person counseling to help you build problem-solving skills. You are more likely to succeed in quitting if you attend counseling sessions regularly. Even short sessions of 10 minutes can be effective. Take medicine You may take medicines to help you quit smoking. Some medicines require a prescription. You can also purchase over-the-counter medicines. Medicines may have nicotine in them to replace the nicotine in cigarettes. Medicines may: Help to stop cravings. Help to relieve withdrawal symptoms. Your health care provider may recommend: Nicotine patches, gum, or lozenges. Nicotine inhalers or sprays. Non-nicotine medicine that you take by mouth. Find resources Find resources and support systems that can help you quit smoking and remain smoke-free after you quit. These resources are most helpful when you use them often. They include: Online chats with a Social worker. Telephone quitlines. Printed Furniture conservator/restorer. Support groups  or group counseling. Text messaging programs. Mobile phone apps or applications. Use apps that can help you stick to your quit plan by providing reminders, tips, and encouragement.  Examples of free services include Quit Guide from the CDC and smokefree.gov  What can I do to make it easier to quit?  Reach out to your family and friends for support and encouragement. Call telephone quitlines, such as 1-800-QUIT-NOW, reach out to support groups, or work with a counselor for support. Ask people who smoke to avoid smoking around you. Avoid places that trigger you to smoke, such as bars, parties, or smoke-break areas at work. Spend time with people who do not smoke. Lessen the stress in your life. Stress can be a smoking trigger for some people. To lessen stress, try: Exercising regularly. Doing deep-breathing exercises. Doing yoga. Meditating. What benefits will I see if I quit smoking? Over time, you should start to see positive results, such as: Improved sense of smell and taste. Decreased coughing and sore throat. Slower heart rate. Lower blood pressure. Clearer and healthier skin. The ability to breathe more easily. Fewer sick days. Summary Quitting smoking can be very challenging. Do not get discouraged if you are not successful the first time. Some people need to make many attempts to quit before they achieve long-term success. When you decide to quit smoking, create a plan to help you succeed. Quit smoking right away, not slowly over a period of time. Find resources and support systems that can help you quit smoking and remain smoke-free after you quit. This information is not intended to replace advice given to you by your health care provider. Make sure you discuss any questions you have with your health care provider. Document Revised: 02/24/2021 Document Reviewed: 02/24/2021 Elsevier Patient Education  2023 [employer].

## 2022-01-15 NOTE — Assessment & Plan Note (Addendum)
-   High resolution CT chest in October 2023 showed no evidence of interstitial lung disease.  Previously described small groundglass nodules in peripheral left upper lobe on CT imaging in March 2023 have resolved.

## 2022-01-16 ENCOUNTER — Ambulatory Visit
Admission: RE | Admit: 2022-01-16 | Discharge: 2022-01-16 | Disposition: A | Discharge status: Home / Self Care | Payer: Medicaid Other | Source: Ambulatory Visit | Attending: Diagnostic Neuroimaging | Admitting: Diagnostic Neuroimaging

## 2022-01-16 ENCOUNTER — Other Ambulatory Visit: Payer: Medicaid Other

## 2022-01-16 DIAGNOSIS — R200 Anesthesia of skin: Secondary | ICD-10-CM

## 2022-01-16 DIAGNOSIS — G894 Chronic pain syndrome: Secondary | ICD-10-CM | POA: Diagnosis not present

## 2022-01-16 DIAGNOSIS — M62838 Other muscle spasm: Secondary | ICD-10-CM | POA: Diagnosis not present

## 2022-01-16 MED ORDER — GADOPICLENOL 0.5 MMOL/ML IV SOLN
9.0000 mL | Freq: Once | INTRAVENOUS | Status: AC | PRN
Start: 2022-01-16 — End: 2022-01-16
  Administered 2022-01-16: 9 mL via INTRAVENOUS

## 2022-01-18 ENCOUNTER — Ambulatory Visit: Payer: Medicaid Other

## 2022-01-18 ENCOUNTER — Ambulatory Visit: Payer: Medicaid Other | Admitting: Oncology

## 2022-01-18 ENCOUNTER — Other Ambulatory Visit: Payer: Medicaid Other

## 2022-01-22 NOTE — Progress Notes (Signed)
Agree with the details of the visit as noted by Elizabeth Walsh, NP.  C. Laura Eliyas Suddreth, MD [facility] 

## 2022-01-29 ENCOUNTER — Inpatient Hospital Stay: Payer: Medicaid Other | Attending: Oncology

## 2022-01-29 ENCOUNTER — Other Ambulatory Visit: Payer: Self-pay

## 2022-01-29 DIAGNOSIS — D509 Iron deficiency anemia, unspecified: Secondary | ICD-10-CM | POA: Insufficient documentation

## 2022-01-29 DIAGNOSIS — D508 Other iron deficiency anemias: Secondary | ICD-10-CM

## 2022-01-29 DIAGNOSIS — E538 Deficiency of other specified B group vitamins: Secondary | ICD-10-CM | POA: Insufficient documentation

## 2022-01-29 DIAGNOSIS — E1142 Type 2 diabetes mellitus with diabetic polyneuropathy: Secondary | ICD-10-CM | POA: Insufficient documentation

## 2022-01-29 LAB — CBC WITH DIFFERENTIAL/PLATELET
Abs Immature Granulocytes: 0.03 10*3/uL (ref 0.00–0.07)
Basophils Absolute: 0.1 10*3/uL (ref 0.0–0.1)
Basophils Relative: 1 %
Eosinophils Absolute: 0.1 10*3/uL (ref 0.0–0.5)
Eosinophils Relative: 1 %
HCT: 28.1 % — ABNORMAL LOW (ref 36.0–46.0)
Hemoglobin: 9.6 g/dL — ABNORMAL LOW (ref 12.0–15.0)
Immature Granulocytes: 0 %
Lymphocytes Relative: 21 %
Lymphs Abs: 1.7 10*3/uL (ref 0.7–4.0)
MCH: 28.3 pg (ref 26.0–34.0)
MCHC: 34.2 g/dL (ref 30.0–36.0)
MCV: 82.9 fL (ref 80.0–100.0)
Monocytes Absolute: 0.4 10*3/uL (ref 0.1–1.0)
Monocytes Relative: 5 %
Neutro Abs: 5.8 10*3/uL (ref 1.7–7.7)
Neutrophils Relative %: 72 %
Platelets: 168 10*3/uL (ref 150–400)
RBC: 3.39 MIL/uL — ABNORMAL LOW (ref 3.87–5.11)
RDW: 22.8 % — ABNORMAL HIGH (ref 11.5–15.5)
WBC: 8 10*3/uL (ref 4.0–10.5)
nRBC: 0 % (ref 0.0–0.2)

## 2022-01-29 LAB — IRON AND TIBC
Iron: 188 ug/dL — ABNORMAL HIGH (ref 28–170)
Saturation Ratios: 42 % — ABNORMAL HIGH (ref 10.4–31.8)
TIBC: 447 ug/dL (ref 250–450)
UIBC: 259 ug/dL

## 2022-01-29 LAB — VITAMIN B12: Vitamin B-12: 249 pg/mL (ref 180–914)

## 2022-01-29 LAB — FERRITIN: Ferritin: 26 ng/mL (ref 11–307)

## 2022-01-29 MED FILL — Iron Sucrose Inj 20 MG/ML (Fe Equiv): INTRAVENOUS | Qty: 10 | Status: AC

## 2022-01-30 ENCOUNTER — Inpatient Hospital Stay (HOSPITAL_BASED_OUTPATIENT_CLINIC_OR_DEPARTMENT_OTHER): Payer: Medicaid Other | Admitting: Oncology

## 2022-01-30 ENCOUNTER — Encounter: Payer: Self-pay | Admitting: Oncology

## 2022-01-30 ENCOUNTER — Inpatient Hospital Stay: Payer: Medicaid Other

## 2022-01-30 VITALS — BP 126/77 | HR 103 | Temp 98.0°F | Wt 197.6 lb

## 2022-01-30 DIAGNOSIS — D508 Other iron deficiency anemias: Secondary | ICD-10-CM

## 2022-01-30 DIAGNOSIS — D509 Iron deficiency anemia, unspecified: Secondary | ICD-10-CM

## 2022-01-30 DIAGNOSIS — E538 Deficiency of other specified B group vitamins: Secondary | ICD-10-CM | POA: Diagnosis not present

## 2022-01-30 DIAGNOSIS — D500 Iron deficiency anemia secondary to blood loss (chronic): Secondary | ICD-10-CM | POA: Insufficient documentation

## 2022-01-30 MED ORDER — CYANOCOBALAMIN 1000 MCG/ML IJ SOLN
1000.0000 ug | Freq: Once | INTRAMUSCULAR | Status: AC
  Administered 2022-01-30: 1000 ug via INTRAMUSCULAR
  Filled 2022-01-30: qty 1

## 2022-01-30 NOTE — Progress Notes (Signed)
Hematology/Oncology Consult note [phone] [phone]         Patient Care Team: Anselm Jungling, NP as PCP - General (Nurse Practitioner)  ASSESSMENT & PLAN:   IDA (iron deficiency anemia) Patient tolerates IV Venofer treatments previously. Labs reviewed and discussed with patient. Ferritin 26, TIBC 447, iron saturation 42. Hold off Venofer treatment.  Recommend patient continue oral iron supplementation.  Low serum vitamin B12 Patient has not been compliant with oral vitamin B12 supplementation. Parental vitamin B12 injection weekly x4 Repeat B12 in the next visit.   Orders Placed This Encounter  Procedures   CBC with Differential/Platelet    Standing Status:   Future    Standing Expiration Date:   01/31/2023   Iron and TIBC    Standing Status:   Future    Standing Expiration Date:   01/31/2023   Ferritin    Standing Status:   Future    Standing Expiration Date:   01/31/2023   Vitamin B12    Standing Status:   Future    Standing Expiration Date:   01/31/2023   Follow-up in 6 weeks. All questions were answered. The patient knows to call the clinic with any problems, questions or concerns.  Rickard Patience, MD, PhD [facility] Hematology Oncology 01/30/2022   CHIEF COMPLAINTS/REASON FOR VISIT:  iron deficiency anemia  HISTORY OF PRESENTING ILLNESS:   Melinda Stone is a  41 y.o.  female presents for iron deficiency anemia.  Patient had blood work done on 07/13/2021, CBC showed hemoglobin of 9.0, MCV 75.3, ferritin 14, iron saturation 21, TIBC 455.  Reviewed her previous lab records.  Patient has chronic anemia since at least 2019.  + Nausea, no vomiting.  Chronic diarrhea after each meal.  Unintentional weight loss Patient reports that she has had colonoscopy done in May which did not reveal etiology of diarrhea.  Colonoscopy record was not available to me at the time of dictation. Chronic numbness and tingling of bilateral lower  extremity secondary to polyneuropathy.    INTERVAL HISTORY Melinda Stone is a 41 y.o. female who has above history reviewed by me today presents for follow up visit for iron deficiency anemia Patient continues to feel tired and fatigued.  She has gained weight compared to weight at last visit Patient takes Protonix for GERD.  Review of Systems  Constitutional:  Positive for fatigue. Negative for chills and fever.  HENT:   Negative for hearing loss and voice change.   Eyes:  Negative for eye problems.  Respiratory:  Negative for chest tightness and cough.   Cardiovascular:  Negative for chest pain.  Gastrointestinal:  Positive for nausea. Negative for abdominal distention, abdominal pain, blood in stool, diarrhea and vomiting.  Endocrine: Negative for hot flashes.  Genitourinary:  Negative for difficulty urinating and frequency.   Musculoskeletal:  Positive for back pain. Negative for arthralgias.  Skin:  Negative for itching and rash.  Neurological:  Positive for numbness. Negative for extremity weakness.  Hematological:  Negative for adenopathy.  Psychiatric/Behavioral:  Negative for confusion.     MEDICAL HISTORY:  Past Medical History:  Diagnosis Date   Anemia    Arthritis 08/17/2020   Chronic back pain    Diabetes mellitus without complication (HCC)    type 2   Migraine    Ovarian cyst    Pneumonia    Polyneuropathy     SURGICAL HISTORY: Past Surgical History:  Procedure Laterality Date   CESAREAN SECTION  CHOLECYSTECTOMY  2004   LUMBAR LAMINECTOMY N/A 05/30/2020   Procedure: Lumbar five Laminectomy,  Bilateral Microdiscectomy, Left Lumbar five -Sacral one Microdiscectomy;  Surgeon: Eldred MangesYates, Mark C, MD;  Location: [facility] OR;  Service: Orthopedics;  Laterality: N/A;   LUMBAR LAMINECTOMY Left 11/14/2020   Procedure: LEFT LUMBAR FOUR-FIVE MICRODISCECTOMY;  Surgeon: Eldred MangesYates, Mark C, MD;  Location: [facility] OR;  Service: Orthopedics;  Laterality: Left;   TUBAL LIGATION   10/19/2010    SOCIAL HISTORY: Social History   Socioeconomic History   Marital status: Single    Spouse name: Not on file   Number of children: 3   Years of education: Not on file   Highest education level: High school graduate  Occupational History    Comment: home maker  Tobacco Use   Smoking status: Former    Types: Cigars   Smokeless tobacco: Never   Tobacco comments:    Not smoking as of 12/23/21. Verified 01/15/22 LW  Vaping Use   Vaping Use: Never used  Substance and Sexual Activity   Alcohol use: Not Currently   Drug use: Not Currently    Types: Marijuana    Comment: per patient stopped smoking marijuana Mar 2022   Sexual activity: Yes    Birth control/protection: None  Other Topics Concern   Not on file  Social History Narrative   Lives with 3 children   Some coffee   Social Determinants of Health   Financial Resource Strain: Not on file  Food Insecurity: Not on file  Transportation Needs: Not on file  Physical Activity: Not on file  Stress: Not on file  Social Connections: Not on file  Intimate Partner Violence: Not on file    FAMILY HISTORY: Family History  Problem Relation Age of Onset   Diabetes Mother    Hypertension Mother    Stroke Mother     ALLERGIES:  has No Known Allergies.  MEDICATIONS:  Current Outpatient Medications  Medication Sig Dispense Refill   amitriptyline (ELAVIL) 25 MG tablet Take 1 tablet (25 mg total) by mouth at bedtime. 30 tablet 3   dicyclomine (BENTYL) 20 MG tablet Take 1 tablet (20 mg total) by mouth 2 (two) times daily. 20 tablet 0   gabapentin (NEURONTIN) 800 MG tablet Take 800 mg by mouth in the morning, at noon, in the evening, and at bedtime.     glipiZIDE (GLUCOTROL) 10 MG tablet Take 2 tablets (20 mg total) by mouth daily before breakfast AND 1 tablet (10 mg total) daily before supper. 270 tablet 3   ondansetron (ZOFRAN-ODT) 4 MG disintegrating tablet Take 1 tablet (4 mg total) by mouth every 8 (eight) hours  as needed. 20 tablet 0   oxyCODONE-acetaminophen (PERCOCET/ROXICET) 5-325 MG tablet Take 1 tablet by mouth every 6 (six) hours as needed for moderate pain or severe pain.     pantoprazole (PROTONIX) 40 MG tablet Take 1 tablet (40 mg total) by mouth daily. 30 tablet 0   tiZANidine (ZANAFLEX) 4 MG tablet Take 4 mg by mouth in the morning, at noon, in the evening, and at bedtime.     VENTOLIN HFA 108 (90 Base) MCG/ACT inhaler 2 puffs every 6 (six) hours as needed for wheezing or shortness of breath.     No current facility-administered medications for this visit.     PHYSICAL EXAMINATION: ECOG PERFORMANCE STATUS: 2 - Symptomatic, <50% confined to bed Vitals:   01/30/22 1307  BP: 126/77  Pulse: (!) 103  Temp: 98 F (36.7 C)  SpO2: 100%   Filed Weights   01/30/22 1307  Weight: 197 lb 9.6 oz (89.6 kg)      Physical Exam Constitutional:      General: She is not in acute distress.    Comments: Patient sits in the wheelchair  HENT:     Head: Normocephalic and atraumatic.  Eyes:     General: No scleral icterus. Cardiovascular:     Rate and Rhythm: Normal rate and regular rhythm.     Heart sounds: Normal heart sounds.  Pulmonary:     Effort: Pulmonary effort is normal. No respiratory distress.     Breath sounds: No wheezing.  Abdominal:     General: Bowel sounds are normal. There is no distension.     Palpations: Abdomen is soft.  Musculoskeletal:        General: No deformity. Normal range of motion.     Cervical back: Normal range of motion and neck supple.  Skin:    General: Skin is warm and dry.  Neurological:     Mental Status: She is alert and oriented to person, place, and time. Mental status is at baseline.     Cranial Nerves: No cranial nerve deficit.     Coordination: Coordination normal.  Psychiatric:        Mood and Affect: Mood normal.     LABORATORY DATA:  I have reviewed the data as listed    Latest Ref Rng & Units 01/29/2022    2:27 PM 10/31/2021     4:46 PM 09/11/2021   11:40 AM  CBC  WBC 4.0 - 10.5 K/uL 8.0  6.2  9.2   Hemoglobin 12.0 - 15.0 g/dL 9.6  54.0  98.1   Hematocrit 36.0 - 46.0 % 28.1  33.1  28.9   Platelets 150 - 400 K/uL 168  167  191       Latest Ref Rng & Units 10/31/2021    4:46 PM 09/11/2021   11:40 AM 06/12/2021    6:10 PM  CMP  Glucose 70 - 99 mg/dL 90  191  478   BUN 6 - 20 mg/dL 9  9  5    Creatinine 0.44 - 1.00 mg/dL  2.95  6.21   Sodium 135 - 145 mmol/L 139  135  136   Potassium 3.5 - 5.1 mmol/L 3.6  4.1  4.1   Chloride 98 - 111 mmol/L 114  109  103   CO2 22 - 32 mmol/L 19  20  20    Calcium 8.9 - 10.3 mg/dL 8.6  8.5  8.4   Total Protein 6.5 - 8.1 g/dL 6.7  6.9  7.7   Total Bilirubin 0.3 - 1.2 mg/dL 1.1  1.4  1.3   Alkaline Phos 38 - 126 U/L 77  86  124   AST 15 - 41 U/L 17  14  31    ALT 0 - 44 U/L 16  10  16       Iron/TIBC/Ferritin/ %Sat    Component Value Date/Time   IRON 188 (H) 01/29/2022 1427   IRON 64 01/02/2019 0955   TIBC 447 01/29/2022 1427   TIBC 382 01/02/2019 0955   FERRITIN 26 01/29/2022 1427   FERRITIN 21 01/02/2019 0955   IRONPCTSAT 42 (H) 01/29/2022 1427   IRONPCTSAT 17 01/02/2019 0955      RADIOGRAPHIC STUDIES: I have personally reviewed the radiological images as listed and agreed with the findings in the report. MR BRAIN W WO CONTRAST  Result Date:  01/19/2022 Table formatting from the original result was not included. [facility] NEUROIM[facility] REPORT STUDY DATE: 01/16/22 PATIENT NAME: RUE VALLADARES DOB: 10-04-1980 MRN: [id number] ORDERING CLINICIAN: Suanne Marker, MD CLINICAL HISTORY: 41 y.o. year old female with: 1. Numbness  2. Pain syndrome, chronic  3. Muscle spasm   EXAM: MR BRAIN W WO CONTRAST TECHNIQUE: MRI of the brain with and without contrast was obtained utilizing multiplanar, multiecho pulse sequences. CONTRAST: Diagnostic Product Medications (last 72 hours)   None   COMPARISON: 06/08/21 CT IMAGING SITE: [facility] [place]  [facility] AT [address] [place] [address] FINDINGS: No abnormal lesions are seen on diffusion-weighted views to suggest acute ischemia. The cortical sulci, fissures and cisterns are normal in size and appearance. Lateral, third and fourth ventricle are normal in size and appearance. No extra-axial fluid collections are seen. No evidence of mass effect or midline shift.  Few scattered periventricular subcortical foci of nonspecific T2 hyperintensities.  No abnormal lesions are seen on post contrast views.  On sagittal views the posterior fossa, pituitary gland and corpus callosum are unremarkable. No evidence of intracranial hemorrhage on SWI views. The orbits and their contents, paranasal sinuses and calvarium are unremarkable.  Intracranial flow voids are present.   Unremarkable MRI brain with and without contrast.  No acute findings. INTERPRETING PHYSICIAN: Suanne Marker, MD Certified in Neurology, Neurophysiology and Neuroimaging [facility] [address], [address] [place], [place] [address] [phone]

## 2022-01-30 NOTE — Assessment & Plan Note (Signed)
Patient has not been compliant with oral vitamin B12 supplementation. Parental vitamin B12 injection weekly x4 Repeat B12 in the next visit.

## 2022-01-30 NOTE — Assessment & Plan Note (Signed)
Patient tolerates IV Venofer treatments previously. Labs reviewed and discussed with patient. Ferritin 26, TIBC 447, iron saturation 42. Hold off Venofer treatment.  Recommend patient continue oral iron supplementation. 

## 2022-01-30 NOTE — Addendum Note (Signed)
Addended by: Rickard Patience on: 01/30/2022 08:48 PM   Modules accepted: Orders

## 2022-02-01 ENCOUNTER — Encounter: Payer: Self-pay | Admitting: Neurology

## 2022-02-05 NOTE — Progress Notes (Signed)
Surgical Instructions    Your procedure is scheduled on Tuesday, December 5th, 2023.   Report to [facility] Main Entrance "A" at 05:30 A.M., then check in with the Admitting office.  Call this number if you have problems the morning of surgery:  [phone]   If you have any questions prior to your surgery date call [phone]: Open Monday-Friday 8am-4pm If you experience any cold or flu symptoms such as cough, fever, chills, shortness of breath, etc. between now and your scheduled surgery, please notify us at the above number     Remember:  Do not eat after midnight the night before your surgery  You may drink clear liquids until 04:30 the morning of your surgery.   Clear liquids allowed are: Water, Non-Citrus Juices (without pulp), Carbonated Beverages, Clear Tea, Black Coffee ONLY (NO MILK, CREAM OR POWDERED CREAMER of any kind), and Gatorade Patient Instructions  The night before surgery:  No food after midnight. ONLY clear liquids after midnight  The day of surgery (if you have diabetes): Drink ONE (1) 12 oz G2 given to you in your pre admission testing appointment by 04:30 the morning of surgery. Drink in one sitting. Do not sip.  This drink was given to you during your hospital  pre-op appointment visit.  Nothing else to drink after completing the  12 oz bottle of G2.         If you have questions, please contact your surgeon's office.     Take these medicines the morning of surgery with A SIP OF WATER:   dicyclomine (BENTYL)  gabapentin (NEURONTIN)  pantoprazole (PROTONIX)   If needed:  ondansetron (ZOFRAN-ODT)  oxyCODONE-acetaminophen (PERCOCET/ROXICET)   As of today, STOP taking any Aspirin (unless otherwise instructed by your surgeon) Aleve, Naproxen, Ibuprofen, Motrin, Advil, Goody's, BC's, all herbal medications, fish oil, and all vitamins.  WHAT DO I DO ABOUT MY DIABETES MEDICATION?   Do not take glipiZIDE (GLUCOTROL) the night before surgery and the  morning of surgery.  Hold TRULICITY - 7 days prior to surgery      HOW TO MANAGE YOUR DIABETES BEFORE AND AFTER SURGERY  Why is it important to control my blood sugar before and after surgery? Improving blood sugar levels before and after surgery helps healing and can limit problems. A way of improving blood sugar control is eating a healthy diet by:  Eating less sugar and carbohydrates  Increasing activity/exercise  Talking with your doctor about reaching your blood sugar goals High blood sugars (greater than 180 mg/dL) can raise your risk of infections and slow your recovery, so you will need to focus on controlling your diabetes during the weeks before surgery. Make sure that the doctor who takes care of your diabetes knows about your planned surgery including the date and location.  How do I manage my blood sugar before surgery? Check your blood sugar at least 4 times a day, starting 2 days before surgery, to make sure that the level is not too high or low.  Check your blood sugar the morning of your surgery when you wake up and every 2 hours until you get to the Short Stay unit.  If your blood sugar is less than 70 mg/dL, you will need to treat for low blood sugar: Do not take insulin. Treat a low blood sugar (less than 70 mg/dL) with  cup of clear juice (cranberry or apple), 4 glucose tablets, OR glucose gel. Recheck blood sugar in 15 minutes after treatment (to make  sure it is greater than 70 mg/dL). If your blood sugar is not greater than 70 mg/dL on recheck, call [phone] for further instructions. Report your blood sugar to the short stay nurse when you get to Short Stay.  If you are admitted to the hospital after surgery: Your blood sugar will be checked by the staff and you will probably be given insulin after surgery (instead of oral diabetes medicines) to make sure you have good blood sugar levels. The goal for blood sugar control after surgery is 80-180 mg/dL.     The day of surgery:         Do not wear jewelry or makeup. Do not wear lotions, powders, perfumes or deodorant. Do not shave 48 hours prior to surgery.   Do not bring valuables to the hospital. Do not wear nail polish, gel polish, artificial nails, or any other type of covering on natural nails (fingers and toes) If you have artificial nails or gel coating that need to be removed by a nail salon, please have this removed prior to surgery. Artificial nails or gel coating may interfere with anesthesia's ability to adequately monitor your vital signs.  [facility] is not responsible for any belongings or valuables.    Do NOT Smoke (Tobacco/Vaping)  24 hours prior to your procedure  If you use a CPAP at night, you may bring your mask for your overnight stay.   Contacts, glasses, hearing aids, dentures or partials may not be worn into surgery, please bring cases for these belongings   For patients admitted to the hospital, discharge time will be determined by your treatment team.   Patients discharged the day of surgery will not be allowed to drive home, and someone needs to stay with them for 24 hours.   SURGICAL WAITING ROOM VISITATION Patients having surgery or a procedure may have no more than 2 support people in the waiting area - these visitors may rotate.   Children under the age of 35 must have an adult with them who is not the patient. If the patient needs to stay at the hospital during part of their recovery, the visitor guidelines for inpatient rooms apply. Pre-op nurse will coordinate an appropriate time for 1 support person to accompany patient in pre-op.  This support person may not rotate.   Please refer to [email or web address] for the visitor guidelines for Inpatients (after your surgery is over and you are in a regular room).    Special instructions:    Oral Hygiene is also important to reduce your risk of infection.   Remember - BRUSH YOUR TEETH THE MORNING OF SURGERY WITH YOUR REGULAR TOOTHPASTE   [facility]- Preparing For Surgery  Before surgery, you can play an important role. Because skin is not sterile, your skin needs to be as free of germs as possible. You can reduce the number of germs on your skin by washing with CHG (chlorahexidine gluconate) Soap before surgery.  CHG is an antiseptic cleaner which kills germs and bonds with the skin to continue killing germs even after washing.     Please do not use if you have an allergy to CHG or antibacterial soaps. If your skin becomes reddened/irritated stop using the CHG.  Do not shave (including legs and underarms) for at least 48 hours prior to first CHG shower. It is OK to shave your face.  Please follow these instructions carefully.     Shower the NIGHT BEFORE SURGERY and the MORNING OF SURGERY with  CHG Soap.   If you chose to wash your hair, wash your hair first as usual with your normal shampoo. After you shampoo, rinse your hair and body thoroughly to remove the shampoo.  Then [employer] and genitals (private parts) with your normal soap and rinse thoroughly to remove soap.  After that Use CHG Soap as you would any other liquid soap. You can apply CHG directly to the skin and wash gently with a scrungie or a clean washcloth.   Apply the CHG Soap to your body ONLY FROM THE NECK DOWN.  Do not use on open wounds or open sores. Avoid contact with your eyes, ears, mouth and genitals (private parts). Wash Face and genitals (private parts)  with your normal soap.   Wash thoroughly, paying special attention to the area where your surgery will be performed.  Thoroughly rinse your body with warm water from the neck down.  DO NOT shower/wash with your normal soap after using and rinsing off the CHG Soap.  Pat yourself dry with a CLEAN TOWEL.  Wear CLEAN PAJAMAS to bed the night before surgery  Place CLEAN SHEETS on your bed the night before your  surgery  DO NOT SLEEP WITH PETS.   Day of Surgery:  Take a shower with CHG soap. Wear Clean/Comfortable clothing the morning of surgery Do not apply any deodorants/lotions.   Remember to brush your teeth WITH YOUR REGULAR TOOTHPASTE.    If you received a COVID test during your pre-op visit, it is requested that you wear a mask when out in public, stay away from anyone that may not be feeling well, and notify your surgeon if you develop symptoms. If you have been in contact with anyone that has tested positive in the last 10 days, please notify your surgeon.    Please read over the following fact sheets that you were given.

## 2022-02-06 ENCOUNTER — Encounter (HOSPITAL_COMMUNITY)
Admission: RE | Admit: 2022-02-06 | Discharge: 2022-02-06 | Disposition: A | Discharge status: Home / Self Care | Payer: Medicaid Other | Source: Ambulatory Visit | Attending: Orthopedic Surgery | Admitting: Orthopedic Surgery

## 2022-02-06 ENCOUNTER — Encounter (HOSPITAL_COMMUNITY): Payer: Self-pay

## 2022-02-06 ENCOUNTER — Other Ambulatory Visit: Payer: Self-pay

## 2022-02-06 VITALS — BP 140/97 | HR 103 | Temp 98.6°F | Resp 17 | Ht 69.0 in | Wt 207.3 lb

## 2022-02-06 DIAGNOSIS — Z01812 Encounter for preprocedural laboratory examination: Secondary | ICD-10-CM | POA: Diagnosis not present

## 2022-02-06 DIAGNOSIS — Z01818 Encounter for other preprocedural examination: Secondary | ICD-10-CM

## 2022-02-06 DIAGNOSIS — M48061 Spinal stenosis, lumbar region without neurogenic claudication: Secondary | ICD-10-CM | POA: Diagnosis not present

## 2022-02-06 DIAGNOSIS — E1142 Type 2 diabetes mellitus with diabetic polyneuropathy: Secondary | ICD-10-CM | POA: Insufficient documentation

## 2022-02-06 HISTORY — DX: Gastro-esophageal reflux disease without esophagitis: K21.9

## 2022-02-06 LAB — BASIC METABOLIC PANEL
Anion gap: 7 (ref 5–15)
BUN: 10 mg/dL (ref 6–20)
CO2: 21 mmol/L — ABNORMAL LOW (ref 22–32)
Calcium: 8.7 mg/dL — ABNORMAL LOW (ref 8.9–10.3)
Chloride: 107 mmol/L (ref 98–111)
Creatinine, Ser: 0.57 mg/dL (ref 0.44–1.00)
GFR, Estimated: >60 mL/min (ref >60)
Glucose, Bld: 232 mg/dL — ABNORMAL HIGH (ref 70–99)
Potassium: 4.1 mmol/L (ref 3.5–5.1)
Sodium: 135 mmol/L (ref 135–145)

## 2022-02-06 LAB — HEMOGLOBIN A1C
Hgb A1c MFr Bld: 4.4 % — ABNORMAL LOW (ref 4.8–5.6)
Mean Plasma Glucose: 79.58 mg/dL

## 2022-02-06 LAB — CBC
HCT: 26 % — ABNORMAL LOW (ref 36.0–46.0)
Hemoglobin: 9 g/dL — ABNORMAL LOW (ref 12.0–15.0)
MCH: 29.1 pg (ref 26.0–34.0)
MCHC: 34.6 g/dL (ref 30.0–36.0)
MCV: 84.1 fL (ref 80.0–100.0)
Platelets: 140 10*3/uL — ABNORMAL LOW (ref 150–400)
RBC: 3.09 MIL/uL — ABNORMAL LOW (ref 3.87–5.11)
RDW: 22.1 % — ABNORMAL HIGH (ref 11.5–15.5)
WBC: 6 10*3/uL (ref 4.0–10.5)
nRBC: 0 % (ref 0.0–0.2)

## 2022-02-06 LAB — TYPE AND SCREEN
ABO/RH(D): A POS
Antibody Screen: NEGATIVE

## 2022-02-06 LAB — SURGICAL PCR SCREEN
MRSA, PCR: NEGATIVE
Staphylococcus aureus: NEGATIVE

## 2022-02-06 LAB — GLUCOSE, CAPILLARY: Glucose-Capillary: 232 mg/dL — ABNORMAL HIGH (ref 70–99)

## 2022-02-06 NOTE — Progress Notes (Signed)
PCP - Anselm Jungling, NP Cardiologist - denies  PPM/ICD - denies   Chest x-ray - 06/03/21 EKG - 06/08/21 Stress Test - denies ECHO - 09/08/21 Cardiac Cath - denies  Sleep Study - 10/24/21, OSA- CPAP - n/a  Fasting Blood Sugar - 110-220 Checks Blood Sugar once every morning  Last dose of GLP1 agonist-  Instructed to hold Trulicity 7 days. Last dose will be 11/26.   ASA/Blood Thinner Instructions: n/a   ERAS Protcol - yes PRE-SURGERY G2- given at PAT  COVID TEST- n/a   Anesthesia review: no  Patient denies shortness of breath, fever, cough and chest pain at PAT appointment   All instructions explained to the patient, with a verbal understanding of the material. Patient agrees to go over the instructions while at home for a better understanding. The opportunity to ask questions was provided.

## 2022-02-06 NOTE — Addendum Note (Signed)
Addended by: Willia Craze on: 02/06/2022 10:02 AM   Modules accepted: Orders

## 2022-02-07 ENCOUNTER — Inpatient Hospital Stay: Payer: Medicaid Other

## 2022-02-07 ENCOUNTER — Encounter: Payer: Self-pay | Admitting: Oncology

## 2022-02-07 ENCOUNTER — Other Ambulatory Visit: Payer: Self-pay | Admitting: Oncology

## 2022-02-07 DIAGNOSIS — D519 Vitamin B12 deficiency anemia, unspecified: Secondary | ICD-10-CM

## 2022-02-07 DIAGNOSIS — E538 Deficiency of other specified B group vitamins: Secondary | ICD-10-CM | POA: Diagnosis not present

## 2022-02-07 DIAGNOSIS — Z23 Encounter for immunization: Secondary | ICD-10-CM

## 2022-02-07 MED ORDER — CYANOCOBALAMIN 1000 MCG/ML IJ SOLN
1000.0000 ug | Freq: Once | INTRAMUSCULAR | Status: DC
  Filled 2022-02-07: qty 1

## 2022-02-07 MED ORDER — CYANOCOBALAMIN 1000 MCG/ML IJ SOLN
500.0000 ug | Freq: Once | INTRAMUSCULAR | Status: AC
  Administered 2022-02-07: 500 ug via INTRAMUSCULAR

## 2022-02-07 NOTE — Progress Notes (Signed)
Patient here for 2nd weekly Vitamin B12 injection.  She states having muscle cramping from waist down after receiving first B12 inj.  Notified Dr. Cathie Hoops and she recommends patient receive Vitamin B12 500 mcg inj today.    Patient also request having the next 2 weekly B12 inj cancelled due to having back surgery on 12/5.

## 2022-02-12 ENCOUNTER — Telehealth: Payer: Self-pay | Admitting: Orthopedic Surgery

## 2022-02-12 NOTE — Anesthesia Preprocedure Evaluation (Deleted)
Anesthesia Evaluation    Airway        Dental   Pulmonary Patient abstained from smoking., former smoker          Cardiovascular      Neuro/Psych    GI/Hepatic   Endo/Other  diabetes    Renal/GU      Musculoskeletal   Abdominal   Peds  Hematology   Anesthesia Other Findings   Reproductive/Obstetrics                              Anesthesia Physical Anesthesia Plan  ASA:   Anesthesia Plan:    Post-op Pain Management:    Induction:   PONV Risk Score and Plan:   Airway Management Planned:   Additional Equipment:   Intra-op Plan:   Post-operative Plan:   Informed Consent:   Plan Discussed with:   Anesthesia Plan Comments: (PAT note by Antionette Poles, PA-C: Follows with pulmonology for history of mild intermittent asthma.  Last seen by Ames Dura, NP on 01/15/2022.  Per note, "Stable; No acute respiratory complaints. Not currently on scheduled bronchodilators. Pulmonary function testing in June 2023 showed mild restriction with moderate diffusion defect.  She had a high-resolution CAT scan in October 2023 that showed no evidence of interstitial lung disease. Continue prn Albuterol hfa two puffs q4-6 hours for shortness of breath/wheezing. Smoking cessation encouraged."  Follows with hematology for history of iron deficiency anemia and B12 deficiency, she is receiving weekly B12 injections and takes p.o. iron supplementation.  Per notes she has had anemia since at least 2019.  Baseline hemoglobin 9 to 10 g/dL.  Patient had home sleep study 10/24/2021 that did not show any evidence for sleep apnea.  Patient on GLP-1 agonist Trulicity for treatment of DM2.  Last dose will be on 02/11/2022.  Preop labs reviewed, hemoglobin 9.0 consistent with history of anemia, platelets mildly low at 140k.  Glucose noted to be elevated at 232 consistent with history of DM2. A1c low at 4.4, likely  unreliable due to anemia.  Hemoglobin result was called to surgeon's office.  EKG 06/08/2021: Sinus tachycardia.  Rate 103.  Low voltage, precordial leads.  Baseline wander in lead V6.  TTE 09/08/2021: 1. Left ventricular ejection fraction, by estimation, is 60 to 65%. The  left ventricle has normal function. The left ventricle has no regional  wall motion abnormalities. There is mild left ventricular hypertrophy.  Left ventricular diastolic parameters  are consistent with Grade I diastolic dysfunction (impaired relaxation).  The average left ventricular global longitudinal strain is -14.3 %. The  global longitudinal strain is normal.   2. Right ventricular systolic function is normal. The right ventricular  size is normal. There is normal pulmonary artery systolic pressure. The  estimated right ventricular systolic pressure is 24.5 mmHg.   3. The mitral valve is normal in structure. No evidence of mitral valve  regurgitation. No evidence of mitral stenosis.   4. Tricuspid valve regurgitation is mild to moderate.   5. The aortic valve is tricuspid. Aortic valve regurgitation is not  visualized. No aortic stenosis is present.   6. The inferior vena cava is normal in size with greater than 50%  respiratory variability, suggesting right atrial pressure of 3 mmHg.   )         Anesthesia Quick Evaluation

## 2022-02-12 NOTE — Telephone Encounter (Signed)
FYI: I spoke with patient, and advised due to Hemoglobin level of 9, Dr. Christell Constant decided to cancel surgery. Patient advised she will need to contact Hematologist to get her hemoglobin level back to a satisfactory level before surgery can be rescheduled. Patient advised Dr. Christell Constant would be able to refer her to Pain management if needed, but patient already has a pain management doctor. Patient hung up at this point ending call.

## 2022-02-12 NOTE — Progress Notes (Addendum)
Anesthesia Chart Review:  Follows with pulmonology for history of mild intermittent asthma.  Last seen by Ames Dura, NP on 01/15/2022.  Per note, "Stable; No acute respiratory complaints. Not currently on scheduled bronchodilators. Pulmonary function testing in June 2023 showed mild restriction with moderate diffusion defect.  She had a high-resolution CAT scan in October 2023 that showed no evidence of interstitial lung disease. Continue prn Albuterol hfa two puffs q4-6 hours for shortness of breath/wheezing. Smoking cessation encouraged."  Follows with hematology for history of iron deficiency anemia and B12 deficiency, she is receiving weekly B12 injections and takes p.o. iron supplementation.  Per notes she has had anemia since at least 2019.  Baseline hemoglobin 9 to 10 g/dL.  Patient had home sleep study 10/24/2021 that did not show any evidence for sleep apnea.  Patient on GLP-1 agonist Trulicity for treatment of DM2.  Last dose will be on 02/11/2022.  Preop labs reviewed, hemoglobin 9.0 consistent with history of anemia, platelets mildly low at 140k.  Glucose noted to be elevated at 232 consistent with history of DM2. A1c low at 4.4, likely unreliable due to anemia.  Hemoglobin result was called to surgeon's office.  Addendum 02/12/2022: I was notified by Dr. Kathi Der surgical scheduler that he will be postponing procedure due to anemia.   EKG 06/08/2021: Sinus tachycardia.  Rate 103.  Low voltage, precordial leads.  Baseline wander in lead V6.  TTE 09/08/2021: 1. Left ventricular ejection fraction, by estimation, is 60 to 65%. The  left ventricle has normal function. The left ventricle has no regional  wall motion abnormalities. There is mild left ventricular hypertrophy.  Left ventricular diastolic parameters  are consistent with Grade I diastolic dysfunction (impaired relaxation).  The average left ventricular global longitudinal strain is -14.3 %. The  global longitudinal strain  is normal.   2. Right ventricular systolic function is normal. The right ventricular  size is normal. There is normal pulmonary artery systolic pressure. The  estimated right ventricular systolic pressure is 24.5 mmHg.   3. The mitral valve is normal in structure. No evidence of mitral valve  regurgitation. No evidence of mitral stenosis.   4. Tricuspid valve regurgitation is mild to moderate.   5. The aortic valve is tricuspid. Aortic valve regurgitation is not  visualized. No aortic stenosis is present.   6. The inferior vena cava is normal in size with greater than 50%  respiratory variability, suggesting right atrial pressure of 3 mmHg.     [facility]annie [facility]Anesthesiology Phone [phone] 02/12/2022 12:35 PM

## 2022-02-13 ENCOUNTER — Telehealth: Payer: Self-pay | Admitting: *Deleted

## 2022-02-13 NOTE — Telephone Encounter (Signed)
Patient called requesting to have her iron infusion moved up to ASAP. She states she needs to have back surgery, but they cancelled it due to her Hgb being 9, they want it >10. They will not even reschedule her until it is up. Please advise  CBC Order: [id number] Status: Final result     Visible to patient: Yes (seen)     Next appt: 03/05/2022 at 03:00 PM in Orthopedics Callie Fielding, MD)     Dx: Lumbar lateral recess stenosis (Left:...   0 Result Notes     1 Follow-up Encounter           Component Ref Range & Units 7 d ago (02/06/22) 2 wk ago (01/29/22) 3 mo ago (10/31/21) 5 mo ago (09/11/21) 7 mo ago (07/13/21) 8 mo ago (06/12/21) 8 mo ago (06/11/21)  WBC 4.0 - 10.5 K/uL 6.0 8.0 6.2 9.2 6.7 11.4 High  10.7 High   RBC 3.87 - 5.11 MIL/uL 3.09 Low  3.39 Low  3.95 3.66 Low  3.60 Low  3.47 Low  2.84 Low   Hemoglobin 12.0 - 15.0 g/dL 9.0 Low  9.6 Low  11.6 Low  10.1 Low  CM 9.2 Low  9.4 Low  7.6 Low   HCT 36.0 - 46.0 % 26.0 Low  28.1 Low  33.1 Low  28.9 Low  27.1 Low  27.5 Low  22.4 Low   MCV 80.0 - 100.0 fL 84.1 82.9 83.8 79.0 Low  75.3 Low  79.3 Low  78.9 Low   MCH 26.0 - 34.0 pg 29.1 28.3 29.4 27.6 25.6 Low  27.1 26.8  MCHC 30.0 - 36.0 g/dL 34.6 34.2 35.0 34.9 33.9 34.2 33.9  RDW 11.5 - 15.5 % 22.1 High  22.8 High  20.4 High  21.4 High  19.0 High  22.5 High  20.8 High   Platelets 150 - 400 K/uL 140 Low  168 167 191 178 218 181  nRBC 0.0 - 0.2 % 0.0 0.0 0.0 0.0 0.0 0.2 0.2 CM  Comment: Performed at [facility], [address], [place], [place] [address]  Neutrophils Relative %   72 R 51 R 71 R 70 R 85 R   Basophils Absolute   0.1 R 0.1 R 0.1 R 0.1 R 0.1 R   Immature Granulocytes   0 R 1 R 0 R 0 R 1 R   Abs Immature Granulocytes   0.03 R, CM 0.07 R, CM 0.04 R, CM 0.01 R, CM 0.12 High  R   Dimorphism       PRESENT   Polychromasia       PRESENT   Ovalocytes       PRESENT CM   Neutro Abs   5.8 R 3.2 R 6.5 R 4.7 R 9.7 High  R   Lymphocytes Relative   21 R 35 R 21 R 23 R 10 R    Lymphs Abs   1.7 R 2.2 R 1.9 R 1.5 R 1.2 R   Monocytes Relative   5 R 7 R 5 R 6 R 3 R   Monocytes Absolute   0.4 R 0.5 R 0.5 R 0.4 R 0.3 R   Eosinophils Relative   1 R 5 R 2 R 0 R 0 R   Eosinophils Absolute   0.1 R 0.3 R 0.2 R 0.0 R 0.0 R   Basophils Relative   1 R 1 R 1 R 1 R 1 R   Resulting Agency  [facility]         Specimen Collected: 02/06/22 10:45 Last Resulted: 02/06/22 11:33

## 2022-02-14 ENCOUNTER — Inpatient Hospital Stay: Payer: Medicaid Other

## 2022-02-15 LAB — NICOTINE/COTININE METABOLITES
Cotinine: <1 ng/mL
Nicotine: <1 ng/mL

## 2022-02-19 ENCOUNTER — Encounter: Payer: Self-pay | Admitting: Oncology

## 2022-02-20 ENCOUNTER — Inpatient Hospital Stay (HOSPITAL_COMMUNITY): Admission: RE | Admit: 2022-02-20 | Payer: Medicaid Other | Source: Home / Self Care | Admitting: Orthopedic Surgery

## 2022-02-20 ENCOUNTER — Encounter (HOSPITAL_COMMUNITY): Admission: RE | Payer: Self-pay | Source: Home / Self Care

## 2022-02-20 DIAGNOSIS — M48061 Spinal stenosis, lumbar region without neurogenic claudication: Secondary | ICD-10-CM

## 2022-02-20 SURGERY — FUSION, SPINE, 2 OR MORE LEVELS, POSTERIOR APPROACH
Anesthesia: General

## 2022-02-21 ENCOUNTER — Inpatient Hospital Stay: Payer: Medicaid Other

## 2022-02-22 ENCOUNTER — Inpatient Hospital Stay: Payer: Medicaid Other | Attending: Oncology

## 2022-02-22 VITALS — BP 133/77 | HR 110 | Temp 96.6°F | Resp 18

## 2022-02-22 DIAGNOSIS — Z23 Encounter for immunization: Secondary | ICD-10-CM

## 2022-02-22 DIAGNOSIS — D509 Iron deficiency anemia, unspecified: Secondary | ICD-10-CM | POA: Insufficient documentation

## 2022-02-22 DIAGNOSIS — E538 Deficiency of other specified B group vitamins: Secondary | ICD-10-CM | POA: Insufficient documentation

## 2022-02-22 DIAGNOSIS — D519 Vitamin B12 deficiency anemia, unspecified: Secondary | ICD-10-CM

## 2022-02-22 MED ORDER — SODIUM CHLORIDE 0.9 % IV SOLN
Freq: Once | INTRAVENOUS | Status: AC
  Filled 2022-02-22: qty 250

## 2022-02-22 MED ORDER — SODIUM CHLORIDE 0.9 % IV SOLN
200.0000 mg | Freq: Once | INTRAVENOUS | Status: AC
  Administered 2022-02-22: 200 mg via INTRAVENOUS
  Filled 2022-02-22: qty 200

## 2022-02-22 NOTE — Patient Instructions (Signed)
[facility]  Discharge Instructions: Thank you for choosing [facility] to provide your oncology and hematology care.  If you have a lab appointment with the Cancer Center, please go directly to the Cancer Center and check in at the registration area.  Wear comfortable clothing and clothing appropriate for easy access to any Portacath or PICC line.   We strive to give you quality time with your provider. You may need to reschedule your appointment if you arrive late (15 or more minutes).  Arriving late affects you and other patients whose appointments are after yours.  Also, if you miss three or more appointments without notifying the office, you may be dismissed from the clinic at the provider's discretion.      For prescription refill requests, have your pharmacy contact our office and allow 72 hours for refills to be completed.    Today you received the following chemotherapy and/or immunotherapy agents Venofer.      To help prevent nausea and vomiting after your treatment, we encourage you to take your nausea medication as directed.  BELOW ARE SYMPTOMS THAT SHOULD BE REPORTED IMMEDIATELY: *FEVER GREATER THAN 100.4 F (38 C) OR HIGHER *CHILLS OR SWEATING *NAUSEA AND VOMITING THAT IS NOT CONTROLLED WITH YOUR NAUSEA MEDICATION *UNUSUAL SHORTNESS OF BREATH *UNUSUAL BRUISING OR BLEEDING *URINARY PROBLEMS (pain or burning when urinating, or frequent urination) *BOWEL PROBLEMS (unusual diarrhea, constipation, pain near the anus) TENDERNESS IN MOUTH AND THROAT WITH OR WITHOUT PRESENCE OF ULCERS (sore throat, sores in mouth, or a toothache) UNUSUAL RASH, SWELLING OR PAIN  UNUSUAL VAGINAL DISCHARGE OR ITCHING   Items with * indicate a potential emergency and should be followed up as soon as possible or go to the Emergency Department if any problems should occur.  Please show the CHEMOTHERAPY ALERT CARD or IMMUNOTHERAPY ALERT CARD at check-in to  the Emergency Department and triage nurse.  Should you have questions after your visit or need to cancel or reschedule your appointment, please contact [facility]  [phone] and follow the prompts.  Office hours are 8:00 a.m. to 4:30 p.m. Monday - Friday. Please note that voicemails left after 4:00 p.m. may not be returned until the following business day.  We are closed weekends and major holidays. You have access to a nurse at all times for urgent questions. Please call the main number to the clinic [phone] and follow the prompts.  For any non-urgent questions, you may also contact your provider using MyChart. We now offer e-Visits for anyone 18 and older to request care online for non-urgent symptoms. For details visit [email or web address].   Also download the MyChart app! Go to the app store, search "MyChart", open the app, select [facility], and log in with your MyChart username and password.  Masks are optional in the cancer centers. If you would like for your care team to wear a mask while they are taking care of you, please let them know. For doctor visits, patients may have with them one support person who is at least 39 years old. At this time, visitors are not allowed in the infusion area.   

## 2022-02-22 NOTE — Progress Notes (Signed)
Pt denies wanting to stay for observation period post Venofer.

## 2022-02-24 ENCOUNTER — Other Ambulatory Visit: Payer: Self-pay

## 2022-02-24 ENCOUNTER — Emergency Department (HOSPITAL_COMMUNITY)
Admission: EM | Admit: 2022-02-24 | Discharge: 2022-02-25 | Disposition: A | Discharge status: Home / Self Care | Payer: Medicaid Other | Attending: Emergency Medicine | Admitting: Emergency Medicine

## 2022-02-24 DIAGNOSIS — J069 Acute upper respiratory infection, unspecified: Secondary | ICD-10-CM | POA: Diagnosis not present

## 2022-02-24 DIAGNOSIS — R070 Pain in throat: Secondary | ICD-10-CM | POA: Diagnosis present

## 2022-02-24 DIAGNOSIS — B974 Respiratory syncytial virus as the cause of diseases classified elsewhere: Secondary | ICD-10-CM | POA: Diagnosis not present

## 2022-02-24 DIAGNOSIS — Z1152 Encounter for screening for COVID-19: Secondary | ICD-10-CM | POA: Insufficient documentation

## 2022-02-24 DIAGNOSIS — B338 Other specified viral diseases: Secondary | ICD-10-CM

## 2022-02-24 LAB — RESP PANEL BY RT-PCR (RSV, FLU A&B, COVID)  RVPGX2
Influenza A by PCR: NEGATIVE
Influenza B by PCR: NEGATIVE
Resp Syncytial Virus by PCR: POSITIVE — AB
SARS Coronavirus 2 by RT PCR: NEGATIVE

## 2022-02-24 LAB — CBC WITH DIFFERENTIAL/PLATELET
Abs Immature Granulocytes: 0.06 10*3/uL (ref 0.00–0.07)
Basophils Absolute: 0.1 10*3/uL (ref 0.0–0.1)
Basophils Relative: 1 %
Eosinophils Absolute: 0.4 10*3/uL (ref 0.0–0.5)
Eosinophils Relative: 4 %
HCT: 29.4 % — ABNORMAL LOW (ref 36.0–46.0)
Hemoglobin: 10 g/dL — ABNORMAL LOW (ref 12.0–15.0)
Immature Granulocytes: 1 %
Lymphocytes Relative: 18 %
Lymphs Abs: 1.9 10*3/uL (ref 0.7–4.0)
MCH: 27.6 pg (ref 26.0–34.0)
MCHC: 34 g/dL (ref 30.0–36.0)
MCV: 81.2 fL (ref 80.0–100.0)
Monocytes Absolute: 0.8 10*3/uL (ref 0.1–1.0)
Monocytes Relative: 7 %
Neutro Abs: 7.1 10*3/uL (ref 1.7–7.7)
Neutrophils Relative %: 69 %
Platelets: 194 10*3/uL (ref 150–400)
RBC: 3.62 MIL/uL — ABNORMAL LOW (ref 3.87–5.11)
RDW: 20.7 % — ABNORMAL HIGH (ref 11.5–15.5)
WBC: 10.3 10*3/uL (ref 4.0–10.5)
nRBC: 0 % (ref 0.0–0.2)

## 2022-02-24 NOTE — ED Provider Triage Note (Signed)
Emergency Medicine Provider Triage Evaluation Note  Melinda Stone , a 41 y.o. female  was evaluated in triage.  Pt complains of feeling sick since yesterday.  Patient states that she has had nausea, vomiting, diarrhea, sore throat, body aches, and generalized abdominal pain since yesterday.  She reports subjective fevers at home.  Denies taking medication at home for her symptoms.  Denies shortness of breath, chest pain at this time  Review of Systems  Positive: As above Negative: As above  Physical Exam  BP 109/66 (BP Location: Right Arm)   Pulse (!) 112   Temp 98.9 F (37.2 C) (Oral)   Resp 16   Ht 5\' 9"  (1.753 m)   Wt 93 kg   LMP 01/15/2022 (Approximate)   SpO2 100%   BMI 30.27 kg/m  Gen:   Awake, no distress   Resp:  Normal effort  MSK:   Moves extremities without difficulty  Other:    Medical Decision Making  Medically screening exam initiated at 10:49 PM.  Appropriate orders placed.  Imaan L Geiselman was informed that the remainder of the evaluation will be completed by another provider, this initial triage assessment does not replace that evaluation, and the importance of remaining in the ED until their evaluation is complete.     01/17/2022, PA-C 02/24/22 2252

## 2022-02-24 NOTE — ED Triage Notes (Signed)
Patient coming to ED for evaluation of sore throat, nausea, and fatigue x 3 days.  No reports of fever.

## 2022-02-25 ENCOUNTER — Emergency Department (HOSPITAL_COMMUNITY): Payer: Medicaid Other

## 2022-02-25 LAB — GROUP A STREP BY PCR: Group A Strep by PCR: NOT DETECTED

## 2022-02-25 LAB — COMPREHENSIVE METABOLIC PANEL
ALT: 17 U/L (ref 0–44)
AST: 19 U/L (ref 15–41)
Albumin: 3.4 g/dL — ABNORMAL LOW (ref 3.5–5.0)
Alkaline Phosphatase: 91 U/L (ref 38–126)
Anion gap: 7 (ref 5–15)
BUN: 9 mg/dL (ref 6–20)
CO2: 24 mmol/L (ref 22–32)
Calcium: 8.7 mg/dL — ABNORMAL LOW (ref 8.9–10.3)
Chloride: 106 mmol/L (ref 98–111)
Creatinine, Ser: 0.73 mg/dL (ref 0.44–1.00)
GFR, Estimated: >60 mL/min (ref >60)
Glucose, Bld: 232 mg/dL — ABNORMAL HIGH (ref 70–99)
Potassium: 4.5 mmol/L (ref 3.5–5.1)
Sodium: 137 mmol/L (ref 135–145)
Total Bilirubin: 1.3 mg/dL — ABNORMAL HIGH (ref 0.3–1.2)
Total Protein: 6.9 g/dL (ref 6.5–8.1)

## 2022-02-25 LAB — LIPASE, BLOOD: Lipase: 42 U/L (ref 11–51)

## 2022-02-25 MED ORDER — SODIUM CHLORIDE 0.9 % IV BOLUS
1000.0000 mL | Freq: Once | INTRAVENOUS | Status: AC
  Administered 2022-02-25: 1000 mL via INTRAVENOUS

## 2022-02-25 MED ORDER — ONDANSETRON 4 MG PO TBDP: 4.0000 mg | ORAL_TABLET | Freq: Three times a day (TID) | ORAL | 0 refills | Status: DC | PRN

## 2022-02-25 MED ORDER — KETOROLAC TROMETHAMINE 15 MG/ML IJ SOLN
15.0000 mg | Freq: Once | INTRAMUSCULAR | Status: AC
  Administered 2022-02-25: 15 mg via INTRAVENOUS
  Filled 2022-02-25: qty 1

## 2022-02-25 MED ORDER — IBUPROFEN 200 MG PO TABS: 600.0000 mg | ORAL_TABLET | Freq: Once | ORAL | Status: DC

## 2022-02-25 NOTE — Discharge Instructions (Signed)
You are seen in the ER today for your upper respiratory symptoms and fatigue.  You have tested positive for RSV which is a type of viral illness.  This will resolve on its own over time.  You may treat your symptoms with over-the-counter medications and increase your hydration.  Follow-up with your primary care doctor return to the ER with any new severe symptoms

## 2022-02-25 NOTE — ED Provider Notes (Signed)
[facility]-EMERGENCY DEPT Provider Note   CSN: [id number] Arrival date & time: 02/24/22  2229     History  Chief Complaint  Patient presents with   Sore Throat   Fatigue    Melinda Stone is a 41 y.o. female with hx of DMT2 who presents with concern for 3 days of sore throat, nausea with 2 episodes of NBNB emesis, cough, fatigue, and body aches. Nyquil at home with minimal relief, decreased appetite, feeling globally fatigued.  Known ill contact.   I have personally reviewed her medical records. She has hx of Lumbar spinal stenosis s/po surgery, Iron deficiency anemia, asthma, GERD.  No anticoagulation.  HPI     Home Medications Prior to Admission medications   Medication Sig Start Date End Date Taking? Authorizing Provider  ondansetron (ZOFRAN-ODT) 4 MG disintegrating tablet Take 1 tablet (4 mg total) by mouth every 8 (eight) hours as needed for nausea or vomiting. 02/25/22  Yes Briani Maul R, PA-C  amitriptyline (ELAVIL) 25 MG tablet Take 1 tablet (25 mg total) by mouth at bedtime. 12/26/21   Penumalli, Glenford Bayley, MD  dicyclomine (BENTYL) 20 MG tablet Take 1 tablet (20 mg total) by mouth 2 (two) times daily. 10/31/21   Karie Mainland, Amjad, PA-C  gabapentin (NEURONTIN) 800 MG tablet Take 800 mg by mouth in the morning, at noon, in the evening, and at bedtime. 04/04/21   [provider]  glipiZIDE (GLUCOTROL) 10 MG tablet Take 2 tablets (20 mg total) by mouth daily before breakfast AND 1 tablet (10 mg total) daily before supper. 10/11/21   Shamleffer, Konrad Dolores, MD  oxyCODONE-acetaminophen (PERCOCET/ROXICET) 5-325 MG tablet Take 1 tablet by mouth every 6 (six) hours as needed for moderate pain or severe pain. 03/13/21   [provider]  pantoprazole (PROTONIX) 40 MG tablet Take 1 tablet (40 mg total) by mouth daily. 06/12/21   Alwyn Ren, MD  TRULICITY 1.5 MG/0.5ML SOPN Inject 0.5 mg into the skin every Sunday. 01/11/22    [provider]      Allergies    Patient has no known allergies.    Review of Systems   Review of Systems  Constitutional:  Positive for activity change, appetite change and fatigue. Negative for chills and fever.  HENT:  Positive for congestion, sneezing and sore throat.   Respiratory:  Positive for cough. Negative for chest tightness and shortness of breath.   Cardiovascular: Negative.   Gastrointestinal:  Positive for diarrhea, nausea and vomiting.  Genitourinary: Negative.   Musculoskeletal:  Positive for myalgias.  Neurological: Negative.     Physical Exam Updated Vital Signs BP 115/71   Pulse 97   Temp 98.5 F (36.9 C) (Oral)   Resp 18   Ht 5\' 9"  (1.753 m)   Wt 93 kg   LMP 01/15/2022 (Approximate)   SpO2 100%   BMI 30.27 kg/m  Physical Exam Vitals and nursing note reviewed.  Constitutional:      Appearance: She is obese. She is not ill-appearing or toxic-appearing.  HENT:     Head: Normocephalic and atraumatic.     Nose: Congestion present.     Mouth/Throat:     Mouth: Mucous membranes are dry.     Pharynx: Oropharynx is clear. Uvula midline. Posterior oropharyngeal erythema present. No oropharyngeal exudate.     Tonsils: No tonsillar exudate.  Eyes:     General: Lids are normal. Vision grossly intact.        Right eye: No discharge.  Left eye: No discharge.     Conjunctiva/sclera: Conjunctivae normal.     Pupils: Pupils are equal, round, and reactive to light.  Neck:     Trachea: Trachea and phonation normal.     Meningeal: Brudzinski's sign and Kernig's sign absent.  Cardiovascular:     Rate and Rhythm: Normal rate and regular rhythm.     Pulses: Normal pulses.     Heart sounds: Normal heart sounds. No murmur heard. Pulmonary:     Effort: Pulmonary effort is normal. No tachypnea, bradypnea, accessory muscle usage, prolonged expiration or respiratory distress.     Breath sounds: Normal breath sounds. No wheezing or rales.  Chest:      Chest wall: No mass, lacerations, deformity, swelling, tenderness, crepitus or edema.  Abdominal:     General: Bowel sounds are normal. There is no distension.     Palpations: Abdomen is soft.     Tenderness: There is no abdominal tenderness. There is no right CVA tenderness, left CVA tenderness, guarding or rebound.  Musculoskeletal:        General: No deformity.     Cervical back: Normal range of motion and neck supple.     Right lower leg: No edema.     Left lower leg: No edema.  Lymphadenopathy:     Cervical: No cervical adenopathy.  Skin:    General: Skin is warm and dry.     Capillary Refill: Capillary refill takes less than 2 seconds.     Findings: No rash.  Neurological:     General: No focal deficit present.     Mental Status: She is alert and oriented to person, place, and time. Mental status is at baseline.  Psychiatric:        Mood and Affect: Mood normal.     ED Results / Procedures / Treatments   Labs (all labs ordered are listed, but only abnormal results are displayed) Labs Reviewed  RESP PANEL BY RT-PCR (RSV, FLU A&B, COVID)  RVPGX2 - Abnormal; Notable for the following components:      Result Value   Resp Syncytial Virus by PCR POSITIVE (*)    All other components within normal limits  CBC WITH DIFFERENTIAL/PLATELET - Abnormal; Notable for the following components:   RBC 3.62 (*)    Hemoglobin 10.0 (*)    HCT 29.4 (*)    RDW 20.7 (*)    All other components within normal limits  COMPREHENSIVE METABOLIC PANEL - Abnormal; Notable for the following components:   Glucose, Bld 232 (*)    Calcium 8.7 (*)    Albumin 3.4 (*)    Total Bilirubin 1.3 (*)    All other components within normal limits  GROUP A STREP BY PCR  LIPASE, BLOOD  URINALYSIS, ROUTINE W REFLEX MICROSCOPIC  PREGNANCY, URINE  CBG MONITORING, ED    EKG None  Radiology DG Chest Portable 1 View  Result Date: 02/25/2022 CLINICAL DATA:  A cough and feeling sick. EXAM: PORTABLE CHEST 1  VIEW COMPARISON:  06/03/2021 FINDINGS: The lungs are clear without focal pneumonia, edema, pneumothorax or pleural effusion. Cardiopericardial silhouette is at upper limits of normal for size. The visualized bony structures of the thorax are unremarkable. IMPRESSION: No active disease. Electronically Signed   By: Kennith Center M.D.   On: 02/25/2022 05:08    Procedures Procedures    Medications Ordered in ED Medications  sodium chloride 0.9 % bolus 1,000 mL (1,000 mLs Intravenous New Bag/Given 02/25/22 0504)  ketorolac (TORADOL) 15  MG/ML injection 15 mg (15 mg Intravenous Given 02/25/22 0505)    ED Course/ Medical Decision Making/ A&P                           Medical Decision Making 41 year old female who presents with concern for URI symptoms x 3 days.   Currently tachycardic on anticoagulation.  Cardiopulmonary exam is normal, abdominal exam is benign, mucous membranes are dry, nasal congestion, mild posterior pharyngeal erythema.  DDx includes limited to viral URI, pneumonia, sepsis, gastroenteritis.  Amount and/or Complexity of Data Reviewed Labs: ordered.    Details: CBC without leukocytosis, mild anemia with hemoglobin of 10 near patient's baseline.  CMP with mild elevated total bili to 1.3, hyperglycemia, otherwise unremarkable.  Lipase is normal, strep test is negative, RVP positive for RSV.   Radiology: ordered.    Details: Chest x-ray visualized this provider negative for acute cardiopulmonary disease, agrees radiologist interpretation   Risk Prescription drug management.   Clinical picture consistent with acute viral illness in accordance with positive RSV testing today.  Patient ministered fluid bolus and Toradol with some improvement in her symptomatology.  Instructions for symptom management at home were given.  No further workup warranted in the ER at this time given reassuring vital signs and physical exam. Normalization of HR after fluid bolus; initial tachycardia  likely related to poor hydration.   Clinical concern for more general etiology that warrant further ED workup or inpatient management is exceedingly low.  Roderica  voiced understanding of her medical evaluation and treatment plan. Each of their questions answered to their expressed satisfaction.  Return precautions were given.  Patient is well-appearing, stable, and was discharged in good condition.  This chart was dictated using voice recognition software, Dragon. Despite the best efforts of this provider to proofread and correct errors, errors may still occur which can change documentation meaning.  Final Clinical Impression(s) / ED Diagnoses Final diagnoses:  RSV infection    Rx / DC Orders ED Discharge Orders          Ordered    ondansetron (ZOFRAN-ODT) 4 MG disintegrating tablet  Every 8 hours PRN        02/25/22 0610              Deshea Pooley, Eugene Gavia, PA-C 02/25/22 0617    Nira Conn, MD 02/26/22 [phone]

## 2022-02-28 ENCOUNTER — Inpatient Hospital Stay: Payer: Medicaid Other

## 2022-02-28 MED FILL — Iron Sucrose Inj 20 MG/ML (Fe Equiv): INTRAVENOUS | Qty: 10 | Status: AC

## 2022-03-01 ENCOUNTER — Telehealth: Payer: Self-pay | Admitting: *Deleted

## 2022-03-01 ENCOUNTER — Inpatient Hospital Stay: Payer: Medicaid Other

## 2022-03-01 NOTE — Telephone Encounter (Signed)
On chart review, RN noticed pt had positive RSV test in ED 5 days ago.  Pt scheduled for venofer infusion today.  Reviewed with Dr Cathie Hoops.  Per Dr Cathie Hoops, reschedule appt if pt still having symptoms.  Per pt, cough still persisits.  Okay to reschedule pt appt.  Message sent to scheduler to assist.

## 2022-03-05 ENCOUNTER — Ambulatory Visit (INDEPENDENT_AMBULATORY_CARE_PROVIDER_SITE_OTHER): Payer: Medicaid Other | Admitting: Orthopedic Surgery

## 2022-03-05 DIAGNOSIS — M5416 Radiculopathy, lumbar region: Secondary | ICD-10-CM

## 2022-03-05 NOTE — Progress Notes (Signed)
Orthopedic Spine Surgery Office Note  Assessment: Patient is a 41 y.o. female with low back pain that radiates into bilateral lower extremities.  More symptomatic on the left side.  Goes down the posterior aspect of the legs and into the thigh on the left side.  She has lateral recess stenosis and a disc herniation that goes into the foramen at L5-S1, she has foraminal stenosis at L4-5 on the left as well.   Plan: -Continue with pain management for pain control -She is getting treatment for her hemoglobin, explained that surgery would become an option if her hemoglobin improves so that she is optimized for surgery -I informed her that I do not have a great explanation for her right sided symptoms and the surgery I had planned would likely not provide her any relief of those symptoms. It also does not reliably relieve back pain -Patient should return to office in 8 weeks, repeat x-rays of lumbar spine at next visit: None   Patient expressed understanding of the plan and all questions were answered to the patient's satisfaction.   ___________________________________________________________________________  History: Patient is a 41 y.o. female who has been previously seen in the office for symptoms consistent with lumbar radiculopathy. Since the last visit, symptom intensity has remained the same.  She is having pain in her low back that radiates into bilateral lower extremities.  She states that she is more symptomatic on the left side.  She feels the pain going down the posterior aspect of her legs.  She sometimes feels into the anterior thigh on the left side.  She has had previous injections with [facility] pain management that have provided her very temporary relief.  She is still in pain management and that takes the edge off.  She was scheduled for surgery but that was canceled due to hemoglobin of 9.  She is currently getting treatment for that.  Previous treatments: PT, home exercises,  over-the-counter pain medications, surgery, injections   COPY OF FIRST NOTE Patient is a 41 y.o. female who presents today for lumbar spine.  Patient has had several years (about 2) of bilateral leg and low back pain.  She states that there is no trauma or injury that brought on the pain.  She states the pain starts in her lower back and goes into her bilateral legs.  She feels it mostly down the back and legs but can feel it pretty much anywhere in the legs.  She feels it goes all the way down into her foot on both the dorsal and plantar aspect.  She gets paresthesias and numbness in a similar distribution as the pain.  She has gotten temporary relief with injections and her prior surgeries but the pain always seems to return.  The pain is gone to the point that its interfering with her daily life and she feels like she cannot even do things as simple as wash the dishes without significant pain.  She feels that her legs have gotten weaker with time as well.  She has not noticed any imbalance.  No clumsiness with her hands.  No trouble with fine motor skills in her hands.  Her worst areas of pain are her lower back and her left leg.  The right leg is much less significant than the left leg.     Weakness: Yes, feels that her legs are weaker Symptoms of imbalance: Denies Paresthesias and numbness: Yes, as described above Bowel or bladder incontinence: Denies Saddle anesthesia: Denies END OF  COPY  Physical Exam:  General: no acute distress, appears stated age Neurologic: alert, answering questions appropriately, following commands Respiratory: unlabored breathing on room air, symmetric chest rise Psychiatric: appropriate affect, normal cadence to speech   MSK (spine):  -Strength exam      Left  Right EHL    3/5  4/5 TA    4/5  4/5 GSC    5/5  5/5 Knee extension  5/5  5/5 Hip flexion   5/5  5/5  -Sensory exam    Sensation intact to light touch in L3-S1 nerve distributions of bilateral  lower extremities  -Achilles DTR: 1/4 on the left, 1/4 on the right -Patellar tendon DTR: 1/4 on the left, 1/4 on the right  -Straight leg raise: negative -Contralateral straight leg raise: negative -Clonus: no beats bilaterally  Imaging: XR of the lumbar spine from 12/21/2021, 09/13/2021 was previously independently reviewed and interpreted, showing disc height loss at L4-5 and L5-S1.  There is sclerosis of the superior endplate of L5 and inferior endplate of L4.  Retrolisthesis of L3 on L4.  No evidence of instability.  PI = 54, LL = 50.   CT of the abdomen pelvis from 06/09/2021 shows near complete removal of the inferior facet of L5, laminotomy defect at L5-S1, subtle lucency in the pars on the left at L4-5 laminectomy defect at L4-5   MRI of the lumbar spine from 09/24/2021 was previously independently reviewed and interpreted, showing degenerative disc disease with Modic changes at L4-5, degenerative disc disease at L5-S1.  Prior surgical defects at L4-5 and L5-S1 as described above in the CT interpretation.  Left-sided foraminal stenosis at L4-5, lateral recess stenosis and left sided foraminal stenosis at L5-S1.  Large broad-based mostly left-sided disc herniation at L5-S1.   Patient name: Melinda Stone Patient MRN: [id number] Date of visit: 03/05/22

## 2022-03-06 DIAGNOSIS — Z0271 Encounter for disability determination: Secondary | ICD-10-CM

## 2022-03-08 ENCOUNTER — Inpatient Hospital Stay: Payer: Medicaid Other

## 2022-03-08 VITALS — BP 129/82 | HR 100 | Temp 97.0°F | Resp 17

## 2022-03-08 DIAGNOSIS — Z23 Encounter for immunization: Secondary | ICD-10-CM

## 2022-03-08 DIAGNOSIS — D509 Iron deficiency anemia, unspecified: Secondary | ICD-10-CM | POA: Diagnosis not present

## 2022-03-08 DIAGNOSIS — D519 Vitamin B12 deficiency anemia, unspecified: Secondary | ICD-10-CM

## 2022-03-08 MED ORDER — SODIUM CHLORIDE 0.9 % IV SOLN
200.0000 mg | Freq: Once | INTRAVENOUS | Status: AC
  Administered 2022-03-08: 200 mg via INTRAVENOUS
  Filled 2022-03-08: qty 200

## 2022-03-08 MED ORDER — CYANOCOBALAMIN 1000 MCG/ML IJ SOLN: 500.0000 ug | Freq: Once | INTRAMUSCULAR | Status: DC

## 2022-03-08 MED ORDER — SODIUM CHLORIDE 0.9 % IV SOLN
Freq: Once | INTRAVENOUS | Status: AC
  Filled 2022-03-08: qty 250

## 2022-03-08 MED ORDER — SODIUM CHLORIDE 0.9% FLUSH
10.0000 mL | Freq: Once | INTRAVENOUS | Status: DC | PRN
  Filled 2022-03-08: qty 10

## 2022-03-08 NOTE — Progress Notes (Signed)
Patient tolerated Venofer infusion well. Explained recommendation of 30 min post monitoring. Patient refused to wait post monitoring. Educated on what signs to watch for & to call with any concerns. No question, discharged. Stable  

## 2022-03-08 NOTE — Patient Instructions (Signed)

## 2022-03-20 ENCOUNTER — Inpatient Hospital Stay: Payer: Medicaid Other | Attending: Oncology

## 2022-03-20 DIAGNOSIS — D509 Iron deficiency anemia, unspecified: Secondary | ICD-10-CM | POA: Insufficient documentation

## 2022-03-20 DIAGNOSIS — E538 Deficiency of other specified B group vitamins: Secondary | ICD-10-CM | POA: Insufficient documentation

## 2022-03-20 DIAGNOSIS — E1142 Type 2 diabetes mellitus with diabetic polyneuropathy: Secondary | ICD-10-CM | POA: Insufficient documentation

## 2022-03-20 DIAGNOSIS — Z87891 Personal history of nicotine dependence: Secondary | ICD-10-CM | POA: Insufficient documentation

## 2022-03-21 ENCOUNTER — Ambulatory Visit: Payer: Medicaid Other

## 2022-03-27 MED FILL — Iron Sucrose Inj 20 MG/ML (Fe Equiv): INTRAVENOUS | Qty: 10 | Status: AC

## 2022-03-28 ENCOUNTER — Inpatient Hospital Stay: Payer: Medicaid Other | Admitting: Oncology

## 2022-03-28 ENCOUNTER — Inpatient Hospital Stay: Payer: Medicaid Other

## 2022-03-28 NOTE — Assessment & Plan Note (Deleted)
Patient tolerates IV Venofer treatments previously. Labs reviewed and discussed with patient. Ferritin 26, TIBC 447, iron saturation 42. Hold off Venofer treatment.  Recommend patient continue oral iron supplementation.

## 2022-04-06 ENCOUNTER — Inpatient Hospital Stay: Payer: Medicaid Other

## 2022-04-06 DIAGNOSIS — D508 Other iron deficiency anemias: Secondary | ICD-10-CM

## 2022-04-06 DIAGNOSIS — E538 Deficiency of other specified B group vitamins: Secondary | ICD-10-CM

## 2022-04-06 DIAGNOSIS — D509 Iron deficiency anemia, unspecified: Secondary | ICD-10-CM | POA: Diagnosis present

## 2022-04-06 DIAGNOSIS — E1142 Type 2 diabetes mellitus with diabetic polyneuropathy: Secondary | ICD-10-CM | POA: Diagnosis not present

## 2022-04-06 DIAGNOSIS — Z87891 Personal history of nicotine dependence: Secondary | ICD-10-CM | POA: Diagnosis not present

## 2022-04-06 LAB — CBC WITH DIFFERENTIAL/PLATELET
Abs Immature Granulocytes: 0.02 10*3/uL (ref 0.00–0.07)
Basophils Absolute: 0.1 10*3/uL (ref 0.0–0.1)
Basophils Relative: 1 %
Eosinophils Absolute: 0.3 10*3/uL (ref 0.0–0.5)
Eosinophils Relative: 4 %
HCT: 31.2 % — ABNORMAL LOW (ref 36.0–46.0)
Hemoglobin: 10.9 g/dL — ABNORMAL LOW (ref 12.0–15.0)
Immature Granulocytes: 0 %
Lymphocytes Relative: 25 %
Lymphs Abs: 1.5 10*3/uL (ref 0.7–4.0)
MCH: 27.8 pg (ref 26.0–34.0)
MCHC: 34.9 g/dL (ref 30.0–36.0)
MCV: 79.6 fL — ABNORMAL LOW (ref 80.0–100.0)
Monocytes Absolute: 0.4 10*3/uL (ref 0.1–1.0)
Monocytes Relative: 7 %
Neutro Abs: 3.6 10*3/uL (ref 1.7–7.7)
Neutrophils Relative %: 63 %
Platelets: 173 10*3/uL (ref 150–400)
RBC: 3.92 MIL/uL (ref 3.87–5.11)
RDW: 19.5 % — ABNORMAL HIGH (ref 11.5–15.5)
WBC: 5.9 10*3/uL (ref 4.0–10.5)
nRBC: 0 % (ref 0.0–0.2)

## 2022-04-06 LAB — RETIC PANEL
Immature Retic Fract: 6.9 % (ref 2.3–15.9)
RBC.: 3.7 MIL/uL — ABNORMAL LOW (ref 3.87–5.11)
Retic Count, Absolute: 264 10*3/uL — ABNORMAL HIGH (ref 19.0–186.0)
Retic Ct Pct: 7.1 % — ABNORMAL HIGH (ref 0.4–3.1)
Reticulocyte Hemoglobin: 31.2 pg (ref >27.9)

## 2022-04-06 LAB — IRON AND TIBC
Iron: 159 ug/dL (ref 28–170)
Saturation Ratios: 34 % — ABNORMAL HIGH (ref 10.4–31.8)
TIBC: 466 ug/dL — ABNORMAL HIGH (ref 250–450)
UIBC: 307 ug/dL

## 2022-04-06 LAB — FERRITIN: Ferritin: 26 ng/mL (ref 11–307)

## 2022-04-06 LAB — VITAMIN B12: Vitamin B-12: 390 pg/mL (ref 180–914)

## 2022-04-10 ENCOUNTER — Inpatient Hospital Stay (HOSPITAL_BASED_OUTPATIENT_CLINIC_OR_DEPARTMENT_OTHER): Payer: Medicaid Other | Admitting: Oncology

## 2022-04-10 ENCOUNTER — Encounter: Payer: Self-pay | Admitting: Oncology

## 2022-04-10 ENCOUNTER — Inpatient Hospital Stay: Payer: Medicaid Other

## 2022-04-10 VITALS — BP 115/73 | HR 101 | Temp 97.2°F | Resp 18 | Wt 202.6 lb

## 2022-04-10 VITALS — BP 119/76 | HR 96 | Temp 97.8°F

## 2022-04-10 DIAGNOSIS — Z23 Encounter for immunization: Secondary | ICD-10-CM

## 2022-04-10 DIAGNOSIS — D508 Other iron deficiency anemias: Secondary | ICD-10-CM

## 2022-04-10 DIAGNOSIS — E538 Deficiency of other specified B group vitamins: Secondary | ICD-10-CM

## 2022-04-10 DIAGNOSIS — D509 Iron deficiency anemia, unspecified: Secondary | ICD-10-CM | POA: Diagnosis not present

## 2022-04-10 DIAGNOSIS — D519 Vitamin B12 deficiency anemia, unspecified: Secondary | ICD-10-CM

## 2022-04-10 MED ORDER — SODIUM CHLORIDE 0.9 % IV SOLN
200.0000 mg | Freq: Once | INTRAVENOUS | Status: AC
  Administered 2022-04-10: 200 mg via INTRAVENOUS
  Filled 2022-04-10: qty 200

## 2022-04-10 MED ORDER — SODIUM CHLORIDE 0.9 % IV SOLN
Freq: Once | INTRAVENOUS | Status: AC
  Filled 2022-04-10: qty 250

## 2022-04-10 MED ORDER — VITAMIN C 250 MG PO TABS: 500.0000 mg | ORAL_TABLET | Freq: Every day | ORAL | 0 refills | Status: DC

## 2022-04-10 MED ORDER — VITAMIN B-12 1000 MCG PO TABS: 1000.0000 ug | ORAL_TABLET | Freq: Every day | ORAL | 1 refills | Status: DC

## 2022-04-10 NOTE — Assessment & Plan Note (Signed)
S/p vitamin B12 injections, she did not tolerate due to muscle cramps.  Recommend oral B12 1021mcg daily.

## 2022-04-10 NOTE — Progress Notes (Signed)
Hematology/Oncology Progress note [phone] [phone]            Patient Care Team: Anselm Jungling, NP as PCP - General (Nurse Practitioner)  ASSESSMENT & PLAN:   IDA (iron deficiency anemia) Patient tolerates IV Venofer treatments previously. Labs reviewed and discussed with patient. Elevated TIBC and iron saturation, microcytosis Recommend IV vnefoer x1.  Continue oral ferrous sulfate 324mg  daily. Add Vitamin C 500mg  daily  Low serum vitamin B12 S/p vitamin B12 injections, she did not tolerate due to muscle cramps.  Recommend oral B12 daily.     No orders of the defined types were placed in this encounter.  Follow-up in 4 months All questions were answered. The patient knows to call the clinic with any problems, questions or concerns.  , MD, PhD [facility] Hematology Oncology 04/10/2022   CHIEF COMPLAINTS/REASON FOR VISIT:  iron deficiency anemia  HISTORY OF PRESENTING ILLNESS:   Melinda Stone is a  42 y.o.  female presents for iron deficiency anemia.  Patient had blood work done on 07/13/2021, CBC showed hemoglobin of 9.0, MCV 75.3, ferritin 14, iron saturation 21, TIBC 455.  Reviewed her previous lab records.  Patient has chronic anemia since at least 2019.  + Nausea, no vomiting.  Chronic diarrhea after each meal.  Unintentional weight loss Patient reports that she has had colonoscopy done in May which did not reveal etiology of diarrhea.  Colonoscopy record was not available to me at the time of dictation. 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    INTERVAL HISTORY Melinda Stone is a 42 y.o. female who has above history reviewed by me today presents for follow up visit for iron deficiency anemia S/p IV Venofer. Currently she take ferrous sulfate 324mg  daily, prescribed by PCP Did not tolerate B12 injections due to muscle cramps. She feels the same.    Review of Systems  Constitutional:  Positive for fatigue. Negative for chills and fever.  HENT:   Negative for hearing loss and voice change.   Eyes:  Negative for eye problems.  Respiratory:  Negative for chest tightness and cough.   Cardiovascular:  Negative for chest pain.  Gastrointestinal:  Negative for abdominal distention, abdominal pain, blood in stool, diarrhea, nausea and vomiting.  Endocrine: Negative for hot flashes.  Genitourinary:  Negative for difficulty urinating and frequency.   Musculoskeletal:  Positive for back pain. Negative for arthralgias.  Skin:  Negative for itching and rash.  Neurological:  Positive for numbness. Negative for extremity weakness.  Hematological:  Negative for adenopathy.  Psychiatric/Behavioral:  Negative for confusion.     MEDICAL HISTORY:  Past Medical History:  Diagnosis Date   Anemia    Arthritis 08/17/2020   Chronic back pain    Diabetes mellitus without complication (HCC)    type 2   GERD (gastroesophageal reflux disease)    Migraine    Ovarian cyst    Pneumonia    Polyneuropathy     SURGICAL HISTORY: Past Surgical History:  Procedure Laterality Date   CESAREAN SECTION     CHOLECYSTECTOMY  2004   LUMBAR LAMINECTOMY N/A 05/30/2020   Procedure: Lumbar five Laminectomy,  Bilateral Microdiscectomy, Left Lumbar five -Sacral one Microdiscectomy;  Surgeon: 10/17/2020, MD;  Location: [facility] OR;  Service: Orthopedics;  Laterality: N/A;   LUMBAR LAMINECTOMY Left 11/14/2020   Procedure: LEFT LUMBAR FOUR-FIVE MICRODISCECTOMY;  Surgeon: 06/01/2020, MD;  Location: [facility] OR;  Service: Orthopedics;  Laterality: Left;   TUBAL LIGATION  10/19/2010    SOCIAL HISTORY: Social History   Socioeconomic History   Marital status: Single    Spouse name: Not on file   Number of children: 3   Years of education: Not on file   Highest education level: High school graduate  Occupational History    Comment: home maker  Tobacco Use   Smoking  status: Former    Types: Cigars    Quit date: 12/23/2021    Years since quitting: 0.2   Smokeless tobacco: Never   Tobacco comments:    Not smoking as of 12/23/21. Verified 01/15/22 LW  Vaping Use   Vaping Use: Never used  Substance and Sexual Activity   Alcohol use: Not Currently   Drug use: Not Currently    Comment: per patient stopped smoking marijuana Mar 2022   Sexual activity: Yes    Birth control/protection: None  Other Topics Concern   Not on file  Social History Narrative   Lives with 3 children   Some coffee   Social Determinants of Health   Financial Resource Strain: Not on file  Food Insecurity: Not on file  Transportation Needs: Not on file  Physical Activity: Not on file  Stress: Not on file  Social Connections: Not on file  Intimate Partner Violence: Not on file    FAMILY HISTORY: Family History  Problem Relation Age of Onset   Diabetes Mother    Hypertension Mother    Stroke Mother     ALLERGIES:  has No Known Allergies.  MEDICATIONS:  Current Outpatient Medications  Medication Sig Dispense Refill   amitriptyline (ELAVIL) 25 MG tablet Take 1 tablet (25 mg total) by mouth at bedtime. 30 tablet 3   cyanocobalamin (VITAMIN B12) 1000 MCG tablet Take 1 tablet (1,000 mcg total) by mouth daily. 90 tablet 1   dicyclomine (BENTYL) 20 MG tablet Take 1 tablet (20 mg total) by mouth 2 (two) times daily. 20 tablet 0   gabapentin (NEURONTIN) 800 MG tablet Take 800 mg by mouth in the morning, at noon, in the evening, and at bedtime.     glipiZIDE (GLUCOTROL) 10 MG tablet Take 2 tablets (20 mg total) by mouth daily before breakfast AND 1 tablet (10 mg total) daily before supper. 270 tablet 3   ondansetron (ZOFRAN-ODT) 4 MG disintegrating tablet Take 1 tablet (4 mg total) by mouth every 8 (eight) hours as needed for nausea or vomiting. 6 tablet 0   oxyCODONE-acetaminophen (PERCOCET/ROXICET) 5-325 MG tablet Take 1 tablet by mouth every 6 (six) hours as needed for  moderate pain or severe pain.     pantoprazole (PROTONIX) 40 MG tablet Take 1 tablet (40 mg total) by mouth daily. 30 tablet 0   TRULICITY 1.5 ZO/1.0RU SOPN Inject 0.5 mg into the skin every Sunday.     vitamin C (ASCORBIC ACID) 250 MG tablet Take 2 tablets (500 mg total) by mouth daily. 180 tablet 0   No current facility-administered medications for this visit.     PHYSICAL EXAMINATION: ECOG PERFORMANCE STATUS: 2 - Symptomatic, <50% confined to bed Vitals:   04/10/22 1430  BP: 115/73  Pulse: (!) 101  Resp: 18  Temp: (!) 97.2 F (36.2 C)   Filed Weights   04/10/22 1430  Weight: 202 lb 9.6 oz (91.9 kg)      Physical Exam Constitutional:      General: She is not in acute distress.    Comments: Patient sits in the wheelchair  HENT:     Head: Normocephalic and atraumatic.  Eyes:     General: No scleral icterus. Cardiovascular:     Rate and Rhythm: Normal rate and regular rhythm.     Heart sounds: Normal heart sounds.  Pulmonary:     Effort: Pulmonary effort is normal. No respiratory distress.     Breath sounds: No wheezing.  Abdominal:     General: Bowel sounds are normal. There is no distension.     Palpations: Abdomen is soft.  Musculoskeletal:        General: No deformity. Normal range of motion.     Cervical back: Normal range of motion and neck supple.  Skin:    General: Skin is warm and dry.  Neurological:     Mental Status: She is alert and oriented to person, place, and time. Mental status is at baseline.     Cranial Nerves: No cranial nerve deficit.     Coordination: Coordination normal.  Psychiatric:        Mood and Affect: Mood normal.     LABORATORY DATA:  I have reviewed the data as listed    Latest Ref Rng & Units 04/06/2022    9:13 AM 02/24/2022   11:39 PM 02/06/2022   10:45 AM  CBC  WBC 4.0 - 10.5 K/uL 5.9  10.3  6.0   Hemoglobin 12.0 - 15.0 g/dL 10.9  10.0  9.0   Hematocrit 36.0 - 46.0 % 31.2  29.4  26.0   Platelets 150 - 400 K/uL 173  194   140       Latest Ref Rng & Units 02/24/2022   11:39 PM 02/06/2022   10:45 AM 10/31/2021    4:46 PM  CMP  Glucose 70 - 99 mg/dL 232  232  90   BUN 6 - 20 mg/dL 9  10  9    Creatinine 0.44 - 1.00 mg/dL 0.73  0.57  0.50   Sodium 135 - 145 mmol/L 137  135  139   Potassium 3.5 - 5.1 mmol/L 4.5  4.1  3.6   Chloride 98 - 111 mmol/L 106  107  114   CO2 22 - 32 mmol/L 24  21  19    Calcium 8.9 - 10.3 mg/dL 8.7  8.7  8.6   Total Protein 6.5 - 8.1 g/dL 6.9   6.7   Total Bilirubin 0.3 - 1.2 mg/dL 1.3   1.1   Alkaline Phos 38 - 126 U/L 91   77   AST 15 - 41 U/L 19   17   ALT 0 - 44 U/L 17   16      Iron/TIBC/Ferritin/ %Sat    Component Value Date/Time   IRON 159 04/06/2022 0913   IRON 64 01/02/2019 0955   TIBC 466 (H) 04/06/2022 0913   TIBC 382 01/02/2019 0955   FERRITIN 26 04/06/2022 0913   FERRITIN 21 01/02/2019 0955   IRONPCTSAT 34 (H) 04/06/2022 0913   IRONPCTSAT 17 01/02/2019 0955      RADIOGRAPHIC STUDIES: I have personally reviewed the radiological images as listed and agreed with the findings in the report. No results found.

## 2022-04-10 NOTE — Assessment & Plan Note (Signed)
Patient tolerates IV Venofer treatments previously. Labs reviewed and discussed with patient. Elevated TIBC and iron saturation, microcytosis Recommend IV vnefoer x1.  Continue oral ferrous sulfate 324mg  daily. Add Vitamin C 500mg  daily

## 2022-04-16 ENCOUNTER — Encounter: Payer: Self-pay | Admitting: Internal Medicine

## 2022-04-16 ENCOUNTER — Ambulatory Visit (INDEPENDENT_AMBULATORY_CARE_PROVIDER_SITE_OTHER): Payer: Medicaid Other | Admitting: Internal Medicine

## 2022-04-16 VITALS — BP 110/68 | HR 84 | Ht 69.0 in | Wt 202.0 lb

## 2022-04-16 DIAGNOSIS — E1142 Type 2 diabetes mellitus with diabetic polyneuropathy: Secondary | ICD-10-CM | POA: Diagnosis not present

## 2022-04-16 LAB — POCT GLYCOSYLATED HEMOGLOBIN (HGB A1C): Hemoglobin A1C: 5.6 % (ref 4.0–5.6)

## 2022-04-16 MED ORDER — EMPAGLIFLOZIN 10 MG PO TABS: 10.0000 mg | ORAL_TABLET | Freq: Every day | ORAL | 2 refills | Status: DC

## 2022-04-16 NOTE — Progress Notes (Signed)
Name: Melinda Stone  Age/ Sex: 42 y.o., female   MRN/ DOB: [id number], [birth year]     PCP: Anselm Jungling, NP   Reason for Endocrinology Evaluation: Type 2 Diabetes Mellitus  Initial Endocrine Consultative Visit: 12/03/2018    PATIENT IDENTIFIER: Melinda Stone is a 42 y.o. female with a past medical history of T2DM. The patient has followed with Endocrinology clinic since 12/03/2018 for consultative assistance with management of her diabetes.  DIABETIC HISTORY:  Melinda Stone was diagnosed with DMat age 77. Metformin had caused GI issues and hypoglycemia. Her hemoglobin A1c has ranged from 7.0% in 04/2018, peaking at 7.8% in 10/2018  On her initial visit to our clinic she had an A1c of 7.8%. She was on Jardiance, she had a prescription for Trulicity and Jardiance were too costly for her. We added glipizide .    Atteted to  Jardiance 09/2020 but she did not pick it up  Trulicity started 2022 but by 08/2021 she developed GI side effects and we stopped   Lives with kids 12 and 9 yrs of age   SUBJECTIVE:   During the last visit (10/11/2021): A1c 5.3 % .   Today (04/16/2022): Melinda Stone is here for a follow up on diabetes.  She checks her blood sugars occasionally . The patient has had hypoglycemic episodes since the last clinic visit.    She was seen by Ortho for back pain 03/05/2022, she received  last glucocorticoid in 08/2021 She received iron infusion on 03/08/2022 She had a follow-up with hematology on 04/10/2022 for iron deficiency anemia  Had a cataract sx last week    She self restrated Trulicity due to hyperglycemia but continues with  diarrhea and vomiting  that she attributes , constipation has resolved    HOME DIABETES REGIMEN:  - Glipizide 10 mg 2 tabs before breakfast and 1 tab before Supper     METER DOWNLOAD SUMMARY: unable to download  90 day average 153 mg/dL   74 -505  mg/dL   DIABETIC COMPLICATIONS: Microvascular complications:    B/L retinopathy , S/P injections  Denies: CKD, neuropathy  Last eye exam: Completed 08/2021   Macrovascular complications:    Denies: CAD, PVD, CVA   HISTORY:  Past Medical History:  Past Medical History:  Diagnosis Date   Anemia    Arthritis 08/17/2020   Chronic back pain    Diabetes mellitus without complication (HCC)    type 2   GERD (gastroesophageal reflux disease)    Migraine    Ovarian cyst    Pneumonia    Polyneuropathy    Past Surgical History:  Past Surgical History:  Procedure Laterality Date   CESAREAN SECTION     CHOLECYSTECTOMY  2004   LUMBAR LAMINECTOMY N/A 05/30/2020   Procedure: Lumbar five Laminectomy,  Bilateral Microdiscectomy, Left Lumbar five -Sacral one Microdiscectomy;  Surgeon: [name], MD;  Location: [facility] OR;  Service: Orthopedics;  Laterality: N/A;   LUMBAR LAMINECTOMY Left 11/14/2020   Procedure: LEFT LUMBAR FOUR-FIVE MICRODISCECTOMY;  Surgeon: [name], MD;  Location: [facility] OR;  Service: Orthopedics;  Laterality: Left;   TUBAL LIGATION  10/19/2010   Social History:  reports that she quit smoking about 3 months ago. Her smoking use included cigars. She has never used smokeless tobacco. She reports that she does not currently use alcohol. She reports that she does not currently use drugs. Family History:  Family History  Problem Relation Age of Onset   Diabetes Mother  Hypertension Mother    Stroke Mother      HOME MEDICATIONS: Allergies as of 04/16/2022       Reactions   Trulicity [dulaglutide] Diarrhea        Medication List        Accurate as of April 16, 2022  8:55 AM. If you have any questions, ask your nurse or doctor.          STOP taking these medications    Trulicity 1.5 GG/2.6RS Sopn Generic drug: Dulaglutide Stopped by: Dorita Sciara, MD       TAKE these medications    amitriptyline 25 MG tablet Commonly known as: ELAVIL Take 1 tablet (25 mg total) by mouth at bedtime.   cyanocobalamin  1000 MCG tablet Commonly known as: VITAMIN B12 Take 1 tablet (1,000 mcg total) by mouth daily.   dicyclomine 20 MG tablet Commonly known as: BENTYL Take 1 tablet (20 mg total) by mouth 2 (two) times daily.   empagliflozin 10 MG Tabs tablet Commonly known as: Jardiance Take 1 tablet (10 mg total) by mouth daily before breakfast. Started by: Dorita Sciara, MD   gabapentin 800 MG tablet Commonly known as: NEURONTIN Take 800 mg by mouth in the morning, at noon, in the evening, and at bedtime.   glipiZIDE 10 MG tablet Commonly known as: GLUCOTROL Take 2 tablets (20 mg total) by mouth daily before breakfast AND 1 tablet (10 mg total) daily before supper.   ondansetron 4 MG disintegrating tablet Commonly known as: ZOFRAN-ODT Take 1 tablet (4 mg total) by mouth every 8 (eight) hours as needed for nausea or vomiting.   oxyCODONE-acetaminophen 5-325 MG tablet Commonly known as: PERCOCET/ROXICET Take 1 tablet by mouth every 6 (six) hours as needed for moderate pain or severe pain.   pantoprazole 40 MG tablet Commonly known as: PROTONIX Take 1 tablet (40 mg total) by mouth daily.   vitamin C 250 MG tablet Commonly known as: ASCORBIC ACID Take 2 tablets (500 mg total) by mouth daily.         OBJECTIVE:   Vital Signs: BP 110/68 (BP Location: Left Arm, Patient Position: Sitting, Cuff Size: Large)   Pulse 84   Ht 5\' 9"  (1.753 m)   Wt 202 lb (91.6 kg)   SpO2 95%   BMI 29.83 kg/m   Wt Readings from Last 3 Encounters:  04/16/22 202 lb (91.6 kg)  04/10/22 202 lb 9.6 oz (91.9 kg)  02/24/22 205 lb (93 kg)     Exam: General: Pt appears drowsy, c/o arthralgia   Lungs: Clear with good BS bilat with no rales, rhonchi, or wheezes  Heart: RRR   Extremities: No  pretibial edema.   Neuro: MS is good with appropriate affect, pt is alert and Ox3    DM foot exam: 10/11/2021 The skin of the feet is intact without sores or ulcerations. The pedal pulses are 2+ on right and 2+ on  left. The sensation is absent to a screening 5.07, 10 gram monofilament bilaterally     DATA REVIEWED:  Lab Results  Component Value Date   HGBA1C 5.6 04/16/2022   HGBA1C 4.4 (L) 02/06/2022   HGBA1C 5.3 10/11/2021    Latest Reference Range & Units 02/24/22 23:39  Sodium 135 - 145 mmol/L 137  Potassium 3.5 - 5.1 mmol/L 4.5  Chloride 98 - 111 mmol/L 106  CO2 22 - 32 mmol/L 24  Glucose 70 - 99 mg/dL 232 (H)  BUN 6 - 20 mg/dL 9  Creatinine 0.44 - 1.00 mg/dL 0.73  Calcium 8.9 - 10.3 mg/dL 8.7 (L)  Anion gap 5 - 15  7  Alkaline Phosphatase 38 - 126 U/L 91  Albumin 3.5 - 5.0 g/dL 3.4 (L)  Lipase 11 - 51 U/L 42  AST 15 - 41 U/L 19  ALT 0 - 44 U/L 17  Total Protein 6.5 - 8.1 g/dL 6.9  Total Bilirubin 0.3 - 1.2 mg/dL 1.3 (H)  GFR, Estimated >60 mL/min >60    ASSESSMENT / PLAN / RECOMMENDATIONS:   1) Type 2 Diabetes Mellitus, Optimally controlled, With Neuropathic and retinopathic complications - Most recent A1c of 4.4  %. Goal A1c < 7.0 %.     - A1c skewed due to recent iron infusions  - In review of her meter manually she has been averaging 151 mg/dL over the past 90 day, ~ A1c 7.0%  - She restarted Trulicity due to hyperglycemia, but continues with GI side effects pt advised to STOP trulicity again, will prescribe Jardiance as below  - Discussed importance of low carb diet and avoiding snacks, as she has been noted with glycemic excursions.     MEDICATIONS: Continue   glipizide 10 mg 2 tabs Before Breakfast and 1 tablet  before Supper  Stop Trulicity  Start Jardiance 10 mg , 1 tab daily   EDUCATION / INSTRUCTIONS: BG monitoring instructions: Patient is instructed to check her blood sugars 2 times a day, fasting and supper time. Call [place] [facility] clinic if: BG persistently < 70  I reviewed the Rule of 15 for the treatment of hypoglycemia in detail with the patient. Literature supplied.    F/U in 6 months    Signed electronically by: Mack Guise, MD  [facility]  [address] [facility] [address] [place], [place] [address] Phone: [phone] FAX: [phone]   CC: [address], [place] [address] Phone: [phone]  Fax: [phone]  Return to Endocrinology clinic as below: Future Appointments  Date Time Provider [place]  05/02/2022  9:15 AM Callie Fielding, MD [facility] None  05/30/2022  9:15 AM Woodroe Mode, MD [facility] None  07/11/2022  9:45 AM Suzzanne Cloud, NP [facility] None  08/09/2022  3:00 PM [facility] None  08/15/2022  2:45 PM Earlie Server, MD [facility] None  08/15/2022  3:00 PM [facility] INFUSION CHAIR 1 [facility] None

## 2022-04-16 NOTE — Patient Instructions (Signed)
-  STOP Trulicity  - Continue Glipizide 10 mg, TWO  tablets before Breakfast and ONE  Tablet Before supper  - START Jardiance 10mg  1 tablet every morning     HOW TO TREAT LOW BLOOD SUGARS (Blood sugar LESS THAN 70 MG/DL) Please follow the RULE OF 15 for the treatment of hypoglycemia treatment (when your (blood sugars are less than 70 mg/dL)   STEP 1: Take 15 grams of carbohydrates when your blood sugar is low, which includes:  3-4 GLUCOSE TABS  OR 3-4 OZ OF JUICE OR REGULAR SODA OR ONE TUBE OF GLUCOSE GEL    STEP 2: RECHECK blood sugar in 15 MINUTES STEP 3: If your blood sugar is still low at the 15 minute recheck --> then, go back to STEP 1 and treat AGAIN with another 15 grams of carbohydrates.

## 2022-04-20 ENCOUNTER — Other Ambulatory Visit (HOSPITAL_COMMUNITY): Payer: Self-pay

## 2022-04-26 ENCOUNTER — Other Ambulatory Visit: Payer: Self-pay | Admitting: Diagnostic Neuroimaging

## 2022-04-28 ENCOUNTER — Other Ambulatory Visit: Payer: Self-pay | Admitting: Diagnostic Neuroimaging

## 2022-05-02 ENCOUNTER — Ambulatory Visit (INDEPENDENT_AMBULATORY_CARE_PROVIDER_SITE_OTHER): Payer: Medicaid Other | Admitting: Orthopedic Surgery

## 2022-05-02 ENCOUNTER — Encounter: Payer: Self-pay | Admitting: Neurology

## 2022-05-02 ENCOUNTER — Other Ambulatory Visit: Payer: Self-pay

## 2022-05-02 ENCOUNTER — Encounter: Payer: Self-pay | Admitting: Orthopedic Surgery

## 2022-05-02 VITALS — BP 146/83 | HR 105

## 2022-05-02 DIAGNOSIS — R202 Paresthesia of skin: Secondary | ICD-10-CM

## 2022-05-02 DIAGNOSIS — M5412 Radiculopathy, cervical region: Secondary | ICD-10-CM

## 2022-05-02 DIAGNOSIS — M5416 Radiculopathy, lumbar region: Secondary | ICD-10-CM

## 2022-05-02 NOTE — Progress Notes (Signed)
Orthopedic Spine Surgery Office Note  Assessment: Patient is a 42 y.o. female with low back pain that radiates into bilateral lower extremities.  More symptomatic on the left side.  Goes down the posterior aspect of the legs and into the thigh on the left side.  She has lateral recess stenosis and a disc herniation that goes into the foramen at L5-S1, she has foraminal stenosis at L4-5 on the left as well. I do not see anything on her MRI to explain her right sided symptoms.   Also, talked about new numbness in her ulnar two digits on the right hand. Has had carpal and cubital tunnel release on that side. Possible C8 radiculopathy   Plan: -Patient has tried PT, home exercises, over-the-counter pain medications, surgery, injections   -She has tried conservative treatments and we have talked about surgery as an option but she is working on optimizing her hemoglobin before surgical intervention -Recommended L4/5 and L5/S1 transforaminal injection. Referral provided today.  -Referred to neurology for EMG/NCS of the right upper extremity -Patient should return to office in 8 weeks, x-rays at next visit: none   Patient expressed understanding of the plan and all questions were answered to the patient's satisfaction.   ___________________________________________________________________________  History: Patient is a 42 y.o. female who has been previously seen in the office for symptoms consistent with lumbar radiculopathy. Her symptoms have gotten worse in the last week. She notes pain in her left leg going into the buttock and down the posterior thigh is worse. There was no recent injury. Her right sided symptoms are the same and otherwise her left sided symptoms are the same. Still having a lot of back pain. Does not feel that pain management helped much.   She also wanted to talk about new decreased sensation in her ulnar two digits on the right upper extremity. She has had these symptoms for the  last couple of weeks. No new trauma or injury. She has had prior carpal and cubital tunnel releases on that side. No pain radiating from the neck. Symptoms are distal to the wrist.   Previous treatments: PT, home exercises, over-the-counter pain medications, surgery, injections    COPY OF FIRST NOTE Patient is a 42 y.o. female who presents today for lumbar spine.  Patient has had several years (about 2) of bilateral leg and low back pain.  She states that there is no trauma or injury that brought on the pain.  She states the pain starts in her lower back and goes into her bilateral legs.  She feels it mostly down the back and legs but can feel it pretty much anywhere in the legs.  She feels it goes all the way down into her foot on both the dorsal and plantar aspect.  She gets paresthesias and numbness in a similar distribution as the pain.  She has gotten temporary relief with injections and her prior surgeries but the pain always seems to return.  The pain is gone to the point that its interfering with her daily life and she feels like she cannot even do things as simple as wash the dishes without significant pain.  She feels that her legs have gotten weaker with time as well.  She has not noticed any imbalance.  No clumsiness with her hands.  No trouble with fine motor skills in her hands.  Her worst areas of pain are her lower back and her left leg.  The right leg is much less significant than the  left leg.     Weakness: Yes, feels that her legs are weaker Symptoms of imbalance: Denies Paresthesias and numbness: Yes, as described above Bowel or bladder incontinence: Denies Saddle anesthesia: Denies END OF COPY  Physical Exam:  General: no acute distress, appears stated age Neurologic: alert, answering questions appropriately, following commands Respiratory: unlabored breathing on room air, symmetric chest rise Psychiatric: appropriate affect, normal cadence to speech   MSK  (spine):  -Strength exam      Left  Right EHL    3/5  4/5 TA    4/5  4/5 GSC    5/5  5/5 Knee extension  5/5  5/5 Hip flexion   5/5  5/5  -Sensory exam    Sensation intact to light touch in L3-S1 nerve distributions of bilateral lower extremities  Imaging: XR of the lumbar spine from 12/21/2021, 09/13/2021 was previously independently reviewed and interpreted, showing disc height loss at L4-5 and L5-S1.  There is sclerosis of the superior endplate of L5 and inferior endplate of L4.  Retrolisthesis of L3 on L4.  No evidence of instability.  PI = 54, LL = 50.   CT of the abdomen pelvis from 06/09/2021 shows near complete removal of the inferior facet of L5, laminotomy defect at L5-S1, subtle lucency in the pars on the left at L4-5 laminectomy defect at L4-5   MRI of the lumbar spine from 09/24/2021 was previously independently reviewed and interpreted, showing degenerative disc disease with Modic changes at L4-5, degenerative disc disease at L5-S1.  Prior surgical defects at L4-5 and L5-S1 as described above in the CT interpretation.  Left-sided foraminal stenosis at L4-5, lateral recess stenosis and left sided foraminal stenosis at L5-S1.  Large broad-based mostly left-sided disc herniation at L5-S1.   Patient name: Melinda Stone Patient MRN: [id number] Date of visit: 05/02/22

## 2022-05-05 ENCOUNTER — Other Ambulatory Visit: Payer: Self-pay

## 2022-05-05 ENCOUNTER — Emergency Department (HOSPITAL_COMMUNITY): Payer: Medicaid Other

## 2022-05-05 ENCOUNTER — Inpatient Hospital Stay (HOSPITAL_COMMUNITY)
Admission: EM | Admit: 2022-05-05 | Discharge: 2022-05-07 | DRG: 178 | Disposition: A | Discharge status: Home / Self Care | Payer: Medicaid Other | Attending: Family Medicine | Admitting: Family Medicine

## 2022-05-05 ENCOUNTER — Encounter (HOSPITAL_COMMUNITY): Payer: Self-pay | Admitting: Emergency Medicine

## 2022-05-05 DIAGNOSIS — Z1152 Encounter for screening for COVID-19: Secondary | ICD-10-CM | POA: Diagnosis not present

## 2022-05-05 DIAGNOSIS — K521 Toxic gastroenteritis and colitis: Secondary | ICD-10-CM | POA: Diagnosis present

## 2022-05-05 DIAGNOSIS — J189 Pneumonia, unspecified organism: Principal | ICD-10-CM

## 2022-05-05 DIAGNOSIS — G894 Chronic pain syndrome: Secondary | ICD-10-CM | POA: Diagnosis present

## 2022-05-05 DIAGNOSIS — Z794 Long term (current) use of insulin: Secondary | ICD-10-CM | POA: Diagnosis not present

## 2022-05-05 DIAGNOSIS — M545 Low back pain, unspecified: Secondary | ICD-10-CM | POA: Diagnosis present

## 2022-05-05 DIAGNOSIS — Z87891 Personal history of nicotine dependence: Secondary | ICD-10-CM | POA: Diagnosis not present

## 2022-05-05 DIAGNOSIS — D649 Anemia, unspecified: Secondary | ICD-10-CM | POA: Diagnosis present

## 2022-05-05 DIAGNOSIS — Z833 Family history of diabetes mellitus: Secondary | ICD-10-CM | POA: Diagnosis not present

## 2022-05-05 DIAGNOSIS — Z888 Allergy status to other drugs, medicaments and biological substances status: Secondary | ICD-10-CM | POA: Diagnosis not present

## 2022-05-05 DIAGNOSIS — E876 Hypokalemia: Secondary | ICD-10-CM | POA: Diagnosis present

## 2022-05-05 DIAGNOSIS — Z7901 Long term (current) use of anticoagulants: Secondary | ICD-10-CM

## 2022-05-05 DIAGNOSIS — E871 Hypo-osmolality and hyponatremia: Secondary | ICD-10-CM | POA: Diagnosis present

## 2022-05-05 DIAGNOSIS — J690 Pneumonitis due to inhalation of food and vomit: Secondary | ICD-10-CM | POA: Diagnosis present

## 2022-05-05 DIAGNOSIS — E10319 Type 1 diabetes mellitus with unspecified diabetic retinopathy without macular edema: Secondary | ICD-10-CM | POA: Diagnosis present

## 2022-05-05 DIAGNOSIS — R112 Nausea with vomiting, unspecified: Secondary | ICD-10-CM | POA: Diagnosis not present

## 2022-05-05 DIAGNOSIS — R079 Chest pain, unspecified: Secondary | ICD-10-CM

## 2022-05-05 DIAGNOSIS — E1040 Type 1 diabetes mellitus with diabetic neuropathy, unspecified: Secondary | ICD-10-CM | POA: Diagnosis present

## 2022-05-05 DIAGNOSIS — J449 Chronic obstructive pulmonary disease, unspecified: Secondary | ICD-10-CM | POA: Diagnosis present

## 2022-05-05 DIAGNOSIS — K219 Gastro-esophageal reflux disease without esophagitis: Secondary | ICD-10-CM | POA: Diagnosis present

## 2022-05-05 DIAGNOSIS — Z9049 Acquired absence of other specified parts of digestive tract: Secondary | ICD-10-CM | POA: Diagnosis not present

## 2022-05-05 DIAGNOSIS — Z79899 Other long term (current) drug therapy: Secondary | ICD-10-CM

## 2022-05-05 DIAGNOSIS — Z8249 Family history of ischemic heart disease and other diseases of the circulatory system: Secondary | ICD-10-CM

## 2022-05-05 DIAGNOSIS — Z7984 Long term (current) use of oral hypoglycemic drugs: Secondary | ICD-10-CM

## 2022-05-05 DIAGNOSIS — Z823 Family history of stroke: Secondary | ICD-10-CM

## 2022-05-05 DIAGNOSIS — T3695XA Adverse effect of unspecified systemic antibiotic, initial encounter: Secondary | ICD-10-CM | POA: Diagnosis present

## 2022-05-05 DIAGNOSIS — O29011 Aspiration pneumonitis due to anesthesia during pregnancy, first trimester: Secondary | ICD-10-CM | POA: Diagnosis present

## 2022-05-05 LAB — CBG MONITORING, ED
Glucose-Capillary: 341 mg/dL — ABNORMAL HIGH (ref 70–99)
Glucose-Capillary: 243 mg/dL — ABNORMAL HIGH (ref 70–99)
Glucose-Capillary: 235 mg/dL — ABNORMAL HIGH (ref 70–99)

## 2022-05-05 LAB — CBC WITH DIFFERENTIAL/PLATELET
Abs Immature Granulocytes: 0.1 10*3/uL — ABNORMAL HIGH (ref 0.00–0.07)
Abs Immature Granulocytes: 0.08 10*3/uL — ABNORMAL HIGH (ref 0.00–0.07)
Basophils Absolute: 0.1 10*3/uL (ref 0.0–0.1)
Basophils Absolute: 0.1 10*3/uL (ref 0.0–0.1)
Basophils Relative: 1 %
Basophils Relative: 0 %
Eosinophils Absolute: 0.1 10*3/uL (ref 0.0–0.5)
Eosinophils Absolute: 0 10*3/uL (ref 0.0–0.5)
Eosinophils Relative: 1 %
Eosinophils Relative: 0 %
HCT: 32 % — ABNORMAL LOW (ref 36.0–46.0)
HCT: 28.5 % — ABNORMAL LOW (ref 36.0–46.0)
Hemoglobin: 11 g/dL — ABNORMAL LOW (ref 12.0–15.0)
Hemoglobin: 9.6 g/dL — ABNORMAL LOW (ref 12.0–15.0)
Immature Granulocytes: 1 %
Immature Granulocytes: 1 %
Lymphocytes Relative: 5 %
Lymphocytes Relative: 7 %
Lymphs Abs: 0.8 10*3/uL (ref 0.7–4.0)
Lymphs Abs: 1.2 10*3/uL (ref 0.7–4.0)
MCH: 28.3 pg (ref 26.0–34.0)
MCH: 27.7 pg (ref 26.0–34.0)
MCHC: 34.4 g/dL (ref 30.0–36.0)
MCHC: 33.7 g/dL (ref 30.0–36.0)
MCV: 82.3 fL (ref 80.0–100.0)
MCV: 82.1 fL (ref 80.0–100.0)
Monocytes Absolute: 0.6 10*3/uL (ref 0.1–1.0)
Monocytes Absolute: 1 10*3/uL (ref 0.1–1.0)
Monocytes Relative: 4 %
Monocytes Relative: 6 %
Neutro Abs: 13.8 10*3/uL — ABNORMAL HIGH (ref 1.7–7.7)
Neutro Abs: 14.7 10*3/uL — ABNORMAL HIGH (ref 1.7–7.7)
Neutrophils Relative %: 88 %
Neutrophils Relative %: 86 %
Platelets: 154 10*3/uL (ref 150–400)
Platelets: 142 10*3/uL — ABNORMAL LOW (ref 150–400)
RBC: 3.89 MIL/uL (ref 3.87–5.11)
RBC: 3.47 MIL/uL — ABNORMAL LOW (ref 3.87–5.11)
RDW: 21.2 % — ABNORMAL HIGH (ref 11.5–15.5)
RDW: 21.9 % — ABNORMAL HIGH (ref 11.5–15.5)
WBC: 15.5 10*3/uL — ABNORMAL HIGH (ref 4.0–10.5)
WBC: 17.1 10*3/uL — ABNORMAL HIGH (ref 4.0–10.5)
nRBC: 0 % (ref 0.0–0.2)
nRBC: 0 % (ref 0.0–0.2)

## 2022-05-05 LAB — RESP PANEL BY RT-PCR (RSV, FLU A&B, COVID)  RVPGX2
Influenza A by PCR: NEGATIVE
Influenza B by PCR: NEGATIVE
Resp Syncytial Virus by PCR: NEGATIVE
SARS Coronavirus 2 by RT PCR: NEGATIVE

## 2022-05-05 LAB — GLUCOSE, CAPILLARY: Glucose-Capillary: 245 mg/dL — ABNORMAL HIGH (ref 70–99)

## 2022-05-05 LAB — URINALYSIS, ROUTINE W REFLEX MICROSCOPIC
Bilirubin Urine: NEGATIVE
Glucose, UA: NEGATIVE mg/dL
Hgb urine dipstick: NEGATIVE
Ketones, ur: NEGATIVE mg/dL
Leukocytes,Ua: NEGATIVE
Nitrite: NEGATIVE
Protein, ur: 100 mg/dL — AB
Specific Gravity, Urine: 1.023 (ref 1.005–1.030)
pH: 5 (ref 5.0–8.0)

## 2022-05-05 LAB — LACTIC ACID, PLASMA
Lactic Acid, Venous: 2.5 mmol/L (ref 0.5–1.9)
Lactic Acid, Venous: 2.3 mmol/L (ref 0.5–1.9)

## 2022-05-05 LAB — BASIC METABOLIC PANEL
Anion gap: 9 (ref 5–15)
BUN: 8 mg/dL (ref 6–20)
CO2: 19 mmol/L — ABNORMAL LOW (ref 22–32)
Calcium: 8.1 mg/dL — ABNORMAL LOW (ref 8.9–10.3)
Chloride: 101 mmol/L (ref 98–111)
Creatinine, Ser: 0.66 mg/dL (ref 0.44–1.00)
GFR, Estimated: >60 mL/min (ref >60)
Glucose, Bld: 303 mg/dL — ABNORMAL HIGH (ref 70–99)
Potassium: 3.5 mmol/L (ref 3.5–5.1)
Sodium: 129 mmol/L — ABNORMAL LOW (ref 135–145)

## 2022-05-05 LAB — TROPONIN I (HIGH SENSITIVITY): Troponin I (High Sensitivity): 4 ng/L (ref <18)

## 2022-05-05 LAB — CREATININE, SERUM
Creatinine, Ser: 0.74 mg/dL (ref 0.44–1.00)
GFR, Estimated: >60 mL/min (ref >60)

## 2022-05-05 MED ORDER — SODIUM CHLORIDE 0.9 % IV SOLN
3.0000 g | Freq: Four times a day (QID) | INTRAVENOUS | Status: DC
  Administered 2022-05-05 – 2022-05-07 (×6): 3 g via INTRAVENOUS
  Filled 2022-05-05 (×7): qty 8

## 2022-05-05 MED ORDER — ONDANSETRON 4 MG PO TBDP: 4.0000 mg | ORAL_TABLET | Freq: Three times a day (TID) | ORAL | Status: DC | PRN

## 2022-05-05 MED ORDER — INSULIN ASPART 100 UNIT/ML IJ SOLN
0.0000 [IU] | Freq: Three times a day (TID) | INTRAMUSCULAR | Status: DC
  Administered 2022-05-05 (×2): 3 [IU] via SUBCUTANEOUS
  Administered 2022-05-06: 5 [IU] via SUBCUTANEOUS
  Administered 2022-05-06: 3 [IU] via SUBCUTANEOUS
  Administered 2022-05-06: 5 [IU] via SUBCUTANEOUS
  Administered 2022-05-07: 3 [IU] via SUBCUTANEOUS
  Filled 2022-05-05: qty 0.09

## 2022-05-05 MED ORDER — SODIUM CHLORIDE 0.9 % IV SOLN
1.0000 g | Freq: Once | INTRAVENOUS | Status: AC
  Administered 2022-05-05: 1 g via INTRAVENOUS
  Filled 2022-05-05: qty 10

## 2022-05-05 MED ORDER — LACTATED RINGERS IV BOLUS (SEPSIS): 1000.0000 mL | Freq: Once | INTRAVENOUS | Status: DC

## 2022-05-05 MED ORDER — INSULIN ASPART 100 UNIT/ML IJ SOLN
2.0000 [IU] | Freq: Three times a day (TID) | INTRAMUSCULAR | Status: DC
  Administered 2022-05-05 – 2022-05-07 (×6): 2 [IU] via SUBCUTANEOUS
  Filled 2022-05-05: qty 0.02

## 2022-05-05 MED ORDER — IBUPROFEN 800 MG PO TABS
800.0000 mg | ORAL_TABLET | Freq: Four times a day (QID) | ORAL | Status: DC | PRN
  Administered 2022-05-07: 800 mg via ORAL
  Filled 2022-05-05: qty 1

## 2022-05-05 MED ORDER — IBUPROFEN 800 MG PO TABS
800.0000 mg | ORAL_TABLET | Freq: Once | ORAL | Status: AC
  Administered 2022-05-05: 800 mg via ORAL
  Filled 2022-05-05: qty 1

## 2022-05-05 MED ORDER — OXYCODONE-ACETAMINOPHEN 5-325 MG PO TABS
1.0000 | ORAL_TABLET | Freq: Four times a day (QID) | ORAL | Status: DC | PRN
  Administered 2022-05-05 – 2022-05-07 (×5): 1 via ORAL
  Filled 2022-05-05 (×5): qty 1

## 2022-05-05 MED ORDER — LACTATED RINGERS IV BOLUS
1000.0000 mL | Freq: Once | INTRAVENOUS | Status: AC
  Administered 2022-05-05: 1000 mL via INTRAVENOUS

## 2022-05-05 MED ORDER — PANTOPRAZOLE SODIUM 40 MG PO TBEC
40.0000 mg | DELAYED_RELEASE_TABLET | Freq: Every day | ORAL | Status: DC
  Administered 2022-05-05 – 2022-05-07 (×3): 40 mg via ORAL
  Filled 2022-05-05 (×3): qty 1

## 2022-05-05 MED ORDER — HEPARIN SODIUM (PORCINE) 5000 UNIT/ML IJ SOLN
5000.0000 [IU] | Freq: Three times a day (TID) | INTRAMUSCULAR | Status: DC
  Administered 2022-05-05 – 2022-05-07 (×6): 5000 [IU] via SUBCUTANEOUS
  Filled 2022-05-05 (×6): qty 1

## 2022-05-05 MED ORDER — LACTATED RINGERS IV BOLUS (SEPSIS)
1000.0000 mL | Freq: Once | INTRAVENOUS | Status: AC
  Administered 2022-05-05: 1000 mL via INTRAVENOUS

## 2022-05-05 MED ORDER — SODIUM CHLORIDE 0.9 % IV SOLN: INTRAVENOUS | Status: DC

## 2022-05-05 MED ORDER — AMITRIPTYLINE HCL 25 MG PO TABS
25.0000 mg | ORAL_TABLET | Freq: Every day | ORAL | Status: DC
  Administered 2022-05-05 – 2022-05-06 (×2): 25 mg via ORAL
  Filled 2022-05-05 (×2): qty 1

## 2022-05-05 MED ORDER — AZITHROMYCIN 250 MG PO TABS
500.0000 mg | ORAL_TABLET | Freq: Once | ORAL | Status: AC
  Administered 2022-05-05: 500 mg via ORAL
  Filled 2022-05-05: qty 2

## 2022-05-05 MED ORDER — PROMETHAZINE HCL 25 MG PO TABS: 12.5000 mg | ORAL_TABLET | Freq: Four times a day (QID) | ORAL | Status: DC | PRN

## 2022-05-05 MED ORDER — GABAPENTIN 400 MG PO CAPS
800.0000 mg | ORAL_CAPSULE | Freq: Three times a day (TID) | ORAL | Status: DC
  Administered 2022-05-05 – 2022-05-07 (×7): 800 mg via ORAL
  Filled 2022-05-05 (×7): qty 2

## 2022-05-05 NOTE — H&P (Signed)
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 11/14/2020 and referred to neurology at the time on 2/14 for EMG studies  Present [facility] ED central chest pain radiating down left arm ongoing Tuesday SOB chills nausea cough She tells me she had developed "sinus" issues in the past 2 to 3 days which is unusual for her-she usually gets this in the spring-she was not around any sick contacts that she remembers and progressed to have further postnasal drip She has been vomiting over the past day and this seems to have been posttussive She states that the chest pain is not severe nonradiating  Came to ED found to have Tmax 99.9 respiratory rate in the 30s pulse rate 100s  Sodium 129 BUN/creatinine 8/0.66 CO2 19 WBC 15 platelet 154, troponin 4,  EKG my over read:-Sinus rhythm PR interval 0.20 QRS axis is 45 degrees no ST-T wave changes other than the rate it seems to be normal Flu negative COVID-negative RSV is negative  CXR early infiltrate right upper lobe  Review of Systems:  No rash no nausea currently no vomiting but has had that recently No lower extremity edema No dark stool no tarry stool Some central chest pain but without radiation and localized to the right side No dysuria    ED Course: Ceftriaxone azithromycin 1 dose given Advil 800 given bolus IV fluid 3 L given blood culture obtained lactic acid obtained  Blood gas ordered but then canceled by me after evaluation of patient   Past Medical History:  Diagnosis Date   Anemia    Arthritis 08/17/2020   Chronic back  pain    Diabetes mellitus without complication (HCC)    type 2   GERD (gastroesophageal reflux disease)    Migraine    Ovarian cyst    Pneumonia    Polyneuropathy    Past Surgical History:  Procedure Laterality Date   CESAREAN SECTION     CHOLECYSTECTOMY  2004   LUMBAR LAMINECTOMY N/A 05/30/2020   Procedure: Lumbar five Laminectomy,  Bilateral Microdiscectomy, Left Lumbar five -Sacral one Microdiscectomy;  Surgeon: Marybelle Killings, MD;  Location: [place];  Service: Orthopedics;  Laterality: N/A;   LUMBAR LAMINECTOMY Left 11/14/2020   Procedure: LEFT LUMBAR FOUR-FIVE MICRODISCECTOMY;  Surgeon: Marybelle Killings, MD;  Location: [place];  Service: Orthopedics;  Laterality: Left;   TUBAL LIGATION  10/19/2010    reports that she quit smoking about 4 months ago. Her smoking use included cigars. She has never used smokeless tobacco. She reports that she does not currently use alcohol. She reports that she does not currently use drugs.  Mobility:  Independent at baseline-patient is disabled secondary to back issues She quit smoking she tells me several months ago used to smoke black and milds She has 3 children that she lives with in [place] She is not aware of any family history other than diabetes in her family   Allergies  Allergen Reactions   Trulicity [Dulaglutide] Diarrhea   Family History  Problem Relation Age of Onset   Diabetes Mother    Hypertension Mother    Stroke Mother    Prior to Admission medications  Medication Sig Start Date End Date Taking? Authorizing Provider  amitriptyline (ELAVIL) 25 MG tablet TAKE 1 TABLET(25 MG) BY MOUTH AT BEDTIME 05/01/22   Penumalli, Earlean Polka, MD  cyanocobalamin (VITAMIN B12) 1000 MCG tablet Take 1 tablet (1,000 mcg total) by mouth daily. 04/10/22   Earlie Server, MD  dicyclomine (BENTYL) 20 MG tablet Take 1 tablet (20 mg total) by mouth 2 (two) times daily. 10/31/21   Evlyn Courier, PA-C  empagliflozin (JARDIANCE) 10 MG TABS tablet Take 1 tablet (10 mg  total) by mouth daily before breakfast. 04/16/22   Shamleffer, Melanie Crazier, MD  gabapentin (NEURONTIN) 800 MG tablet Take 800 mg by mouth in the morning, at noon, in the evening, and at bedtime. 04/04/21   [provider]  glipiZIDE (GLUCOTROL) 10 MG tablet Take 2 tablets (20 mg total) by mouth daily before breakfast AND 1 tablet (10 mg total) daily before supper. 10/11/21   Shamleffer, Melanie Crazier, MD  ondansetron (ZOFRAN-ODT) 4 MG disintegrating tablet Take 1 tablet (4 mg total) by mouth every 8 (eight) hours as needed for nausea or vomiting. 02/25/22   Sponseller, Eugene Garnet R, PA-C  oxyCODONE-acetaminophen (PERCOCET/ROXICET) 5-325 MG tablet Take 1 tablet by mouth every 6 (six) hours as needed for moderate pain or severe pain. 03/13/21   [provider]  pantoprazole (PROTONIX) 40 MG tablet Take 1 tablet (40 mg total) by mouth daily. 06/12/21   Georgette Shell, MD  vitamin C (ASCORBIC ACID) 250 MG tablet Take 2 tablets (500 mg total) by mouth daily. 04/10/22   Earlie Server, MD    Physical Exam:  Vitals:   05/05/22 0800 05/05/22 0841  BP: 132/68   Pulse: (!) 109   Resp: (!) 30   Temp:  99.3 F (37.4 C)  SpO2: 94%     Sleepy appearing black female no distress arouses readily slightly increased work of breathing RR about 20 no accessory muscle use nor flaring Significant rhinorrhea and tenderness over maxillary sinuses no submandibular lymphadenopathy or submental S1-S2 tachycardic Abdomen obese nontender she does have postsurgical scars Tattoo over right shoulder No lower extremity edema Power is slightly diminished in lower extremities (chronic secondary to her low back issues and) Euthymic coherent      Active Problems:   * No active hospital problems. *   Assessment/Plan Aspiration pneumonia?  Early sepsis with mild respiratory failure in the setting of prior smoking - No lactic acid so cannot qualify readily she is not hypotensive however - She is  satting at 100% so I do not think she needs a blood gas but if her work of breathing changes we will have to reevaluate today - Bolused GDMT weight-based fluids - Start 150 cc/H saline, change antibiotics to Unasyn - Follow blood culture, lactic acid - Can go to progressive bed  Chest pain - Likely pleurisy, troponin is 4, EKG shows no changes other than rate related-no workup needed subsequently  DM TY 1 retinopathy neuropathy followed by endocrinology-last A1c about 7 in the recent past - Will need sliding scale coverage will hold oral meds at this time - Reimplement as able and the next several days  Chronic lumbar pain - Continue Elavil 25 mg, oxycodone 1 every 6 as needed as per home dose, gabapentin 800 and reassess  BMI 29 - Will require outpatient counseling regarding weight loss etc.  Severity of Illness: The appropriate patient status for this patient is INPATIENT. Inpatient status is judged to be reasonable and necessary in order to provide  the required intensity of service to ensure the patient's safety. The patient's presenting symptoms, physical exam findings, and initial radiographic and laboratory data in the context of their chronic comorbidities is felt to place them at high risk for further clinical deterioration. Furthermore, it is not anticipated that the patient will be medically stable for discharge from the hospital within 2 midnights of admission.   * I certify that at the point of admission it is my clinical judgment that the patient will require inpatient hospital care spanning beyond 2 midnights from the point of admission due to high intensity of service, high risk for further deterioration and high frequency of surveillance required.*   DVT prophylaxis: Heparin Code Status: Full Family Communication: None Consults called: No  Time spent: 55 minutes  Verlon Au, MD [days-call my NP partners at night for Care related issues] [facility] --[facility] [employer] [facility] , [email or web address]; password [facility]  05/05/2022, 8:59 AM

## 2022-05-05 NOTE — ED Notes (Signed)
ED TO INPATIENT HANDOFF REPORT  ED Nurse Name and Phone #: [phone] [name] Name/Age/Gender Melinda Stone 42 y.o. female Room/Bed: WA23/WA23  Code Status   Code Status: Full Code  Home/SNF/Other Home Patient oriented to: self, place, time, and situation Is this baseline? Yes   Triage Complete: Triage complete  Chief Complaint Aspirat pneumonitis due to anesth during preg, first tri [O29.011]  Triage Note Pt arrives via POV c/o chest pain. Pt sts centralized chest pain radiating down left arm. Pain ongoing since Tuesday. No aggravating factors. Also c/o SOB, chills, nausea, coughing, headache, dizzy/lightheaded, and weakness.       Allergies Allergies  Allergen Reactions   Trulicity [Dulaglutide] Diarrhea    Level of Care/Admitting Diagnosis ED Disposition     ED Disposition  Admit   Condition  --   Comment  Hospital Area: [facility] [[id number]]  Level of Care: Progressive [102]  Admit to Progressive based on following criteria: RESPIRATORY PROBLEMS hypoxemic/hypercapnic respiratory failure that is responsive to NIPPV (BiPAP) or High Flow Nasal Cannula (6-80 lpm). Frequent assessment/intervention, no > Q2 hrs < Q4 hrs, to maintain oxygenation and pulmonary hygiene.  Admit to Progressive based on following criteria: MULTISYSTEM THREATS such as stable sepsis, metabolic/electrolyte imbalance with or without encephalopathy that is responding to early treatment.  May admit patient to [facility] if equivalent level of care is available:: Yes  Covid Evaluation: Confirmed COVID Negative  Diagnosis: Aspirat pneumonitis due to anesth during preg, first tri [id number]  Admitting Physician: Nita Sells [phone]  Attending Physician: Nita Sells [id number]  Certification:: I certify this patient will need inpatient services for at least 2 midnights  Estimated Length of Stay: 4          B Medical/Surgery History Past  Medical History:  Diagnosis Date   Anemia    Arthritis 08/17/2020   Chronic back pain    Diabetes mellitus without complication ([place])    type 2   GERD (gastroesophageal reflux disease)    Migraine    Ovarian cyst    Pneumonia    Polyneuropathy    Past Surgical History:  Procedure Laterality Date   CESAREAN SECTION     CHOLECYSTECTOMY  2004   LUMBAR LAMINECTOMY N/A 05/30/2020   Procedure: Lumbar five Laminectomy,  Bilateral Microdiscectomy, Left Lumbar five -Sacral one Microdiscectomy;  Surgeon: Marybelle Killings, MD;  Location: [facility];  Service: Orthopedics;  Laterality: N/A;   LUMBAR LAMINECTOMY Left 11/14/2020   Procedure: LEFT LUMBAR FOUR-FIVE MICRODISCECTOMY;  Surgeon: Marybelle Killings, MD;  Location: [facility];  Service: Orthopedics;  Laterality: Left;   TUBAL LIGATION  10/19/2010     A IV Location/Drains/Wounds Patient Lines/Drains/Airways Status     Active Line/Drains/Airways     Name Placement date Placement time Site Days   Peripheral IV 05/05/22 20 G 1" Right Antecubital 05/05/22  0121  Antecubital  less than 1   Incision (Closed) 05/30/20 Back Other (Comment) 05/30/20  1456  -- 705   Incision (Closed) 11/14/20 Back 11/14/20  1412  -- 537            Intake/Output Last 24 hours No intake or output data in the 24 hours ending 05/05/22 1849  Labs/Imaging Results for orders placed or performed during the hospital encounter of 05/05/22 (from the past 48 hour(s))  CBG monitoring, ED     Status: Abnormal   Collection Time: 05/05/22  1:02 AM  Result Value Ref Range   Glucose-Capillary  341 (H) 70 - 99 mg/dL    Comment: Glucose reference range applies only to samples taken after fasting for at least 8 hours.  Resp panel by RT-PCR (RSV, Flu A&B, Covid) Anterior Nasal Swab     Status: None   Collection Time: 05/05/22  1:49 AM   Specimen: Anterior Nasal Swab  Result Value Ref Range   SARS Coronavirus 2 by RT PCR NEGATIVE NEGATIVE    Comment: (NOTE) SARS-CoV-2 target nucleic  acids are NOT DETECTED.  The SARS-CoV-2 RNA is generally detectable in upper respiratory specimens during the acute phase of infection. The lowest concentration of SARS-CoV-2 viral copies this assay can detect is 138 copies/mL. A negative result does not preclude SARS-Cov-2 infection and should not be used as the sole basis for treatment or other patient management decisions. A negative result may occur with  improper specimen collection/handling, submission of specimen other than nasopharyngeal swab, presence of viral mutation(s) within the areas targeted by this assay, and inadequate number of viral copies(<138 copies/mL). A negative result must be combined with clinical observations, patient history, and epidemiological information. The expected result is Negative.  Fact Sheet for Patients:  EntrepreneurPulse.com.au  Fact Sheet for Healthcare Providers:  IncredibleEmployment.be  This test is no t yet approved or cleared by the [place] FDA and  has been authorized for detection and/or diagnosis of SARS-CoV-2 by FDA under an Emergency Use Authorization (EUA). This EUA will remain  in effect (meaning this test can be used) for the duration of the COVID-19 declaration under Section 564(b)(1) of the Act, 21 U.S.C.section 360bbb-3(b)(1), unless the authorization is terminated  or revoked sooner.       Influenza A by PCR NEGATIVE NEGATIVE   Influenza B by PCR NEGATIVE NEGATIVE    Comment: (NOTE) The Xpert Xpress SARS-CoV-2/FLU/RSV plus assay is intended as an aid in the diagnosis of influenza from Nasopharyngeal swab specimens and should not be used as a sole basis for treatment. Nasal washings and aspirates are unacceptable for Xpert Xpress SARS-CoV-2/FLU/RSV testing.  Fact Sheet for Patients: EntrepreneurPulse.com.au  Fact Sheet for Healthcare Providers: IncredibleEmployment.be  This test is not  yet approved or cleared by the [place] FDA and has been authorized for detection and/or diagnosis of SARS-CoV-2 by FDA under an Emergency Use Authorization (EUA). This EUA will remain in effect (meaning this test can be used) for the duration of the COVID-19 declaration under Section 564(b)(1) of the Act, 21 U.S.C. section 360bbb-3(b)(1), unless the authorization is terminated or revoked.     Resp Syncytial Virus by PCR NEGATIVE NEGATIVE    Comment: (NOTE) Fact Sheet for Patients: EntrepreneurPulse.com.au  Fact Sheet for Healthcare Providers: IncredibleEmployment.be  This test is not yet approved or cleared by the [place] FDA and has been authorized for detection and/or diagnosis of SARS-CoV-2 by FDA under an Emergency Use Authorization (EUA). This EUA will remain in effect (meaning this test can be used) for the duration of the COVID-19 declaration under Section 564(b)(1) of the Act, 21 U.S.C. section 360bbb-3(b)(1), unless the authorization is terminated or revoked.  Performed at [facility], [place] [address] [place], [place] [id number]   Basic metabolic panel     Status: Abnormal   Collection Time: 05/05/22  1:50 AM  Result Value Ref Range   Sodium 129 (L) 135 - 145 mmol/L   Potassium 3.5 3.5 - 5.1 mmol/L   Chloride 101 98 - 111 mmol/L   CO2 19 (L) 22 - 32 mmol/L  Glucose, Bld 303 (H) 70 - 99 mg/dL    Comment: Glucose reference range applies only to samples taken after fasting for at least 8 hours.   BUN 8 6 - 20 mg/dL   Creatinine, Ser 0.66 0.44 - 1.00 mg/dL   Calcium 8.1 (L) 8.9 - 10.3 mg/dL   GFR, Estimated >60 >60 mL/min    Comment: (NOTE) Calculated using the CKD-EPI Creatinine Equation (2021)    Anion gap 9 5 - 15    Comment: Performed at [facility], [place] [address] [place], [place] [address]  Troponin I (High Sensitivity)     Status: None   Collection Time: 05/05/22  1:50 AM   Result Value Ref Range   Troponin I (High Sensitivity) 4 <18 ng/L    Comment: (NOTE) Elevated high sensitivity troponin I (hsTnI) values and significant  changes across serial measurements may suggest ACS but many other  chronic and acute conditions are known to elevate hsTnI results.  Refer to the "Links" section for chest pain algorithms and additional  guidance. Performed at [facility], [place] [address], [place], [place] [address]   CBC with Differential     Status: Abnormal   Collection Time: 05/05/22  1:50 AM  Result Value Ref Range   WBC 15.5 (H) 4.0 - 10.5 K/uL   RBC 3.89 3.87 - 5.11 MIL/uL   Hemoglobin 11.0 (L) 12.0 - 15.0 g/dL   HCT 32.0 (L) 36.0 - 46.0 %   MCV 82.3 80.0 - 100.0 fL   MCH 28.3 26.0 - 34.0 pg   MCHC 34.4 30.0 - 36.0 g/dL   RDW 21.2 (H) 11.5 - 15.5 %   Platelets 154 150 - 400 K/uL   nRBC 0.0 0.0 - 0.2 %   Neutrophils Relative % 88 %   Neutro Abs 13.8 (H) 1.7 - 7.7 K/uL   Lymphocytes Relative 5 %   Lymphs Abs 0.8 0.7 - 4.0 K/uL   Monocytes Relative 4 %   Monocytes Absolute 0.6 0.1 - 1.0 K/uL   Eosinophils Relative 1 %   Eosinophils Absolute 0.1 0.0 - 0.5 K/uL   Basophils Relative 1 %   Basophils Absolute 0.1 0.0 - 0.1 K/uL   Immature Granulocytes 1 %   Abs Immature Granulocytes 0.10 (H) 0.00 - 0.07 K/uL   Ovalocytes PRESENT     Comment: Performed at [facility], [place] [address], [place], [place] [address]  Urinalysis, Routine w reflex microscopic -Urine, Clean Catch     Status: Abnormal   Collection Time: 05/05/22  4:54 AM  Result Value Ref Range   Color, Urine AMBER (A) YELLOW    Comment: BIOCHEMICALS MAY BE AFFECTED BY COLOR   APPearance HAZY (A) CLEAR   Specific Gravity, Urine 1.023 1.005 - 1.030   pH 5.0 5.0 - 8.0   Glucose, UA NEGATIVE NEGATIVE mg/dL   Hgb urine dipstick NEGATIVE NEGATIVE   Bilirubin Urine NEGATIVE NEGATIVE   Ketones, ur NEGATIVE NEGATIVE mg/dL   Protein, ur 100 (A) NEGATIVE mg/dL    Nitrite NEGATIVE NEGATIVE   Leukocytes,Ua NEGATIVE NEGATIVE   RBC / HPF 0-5 0 - 5 RBC/hpf   WBC, UA 6-10 0 - 5 WBC/hpf   Bacteria, UA FEW (A) NONE SEEN   Squamous Epithelial / HPF 6-10 0 - 5 /HPF   Mucus PRESENT    Hyaline Casts, UA PRESENT     Comment: Performed at [facility], [place] [address] [place], [place] [address]  Lactic acid, plasma     Status: Abnormal   Collection Time: 05/05/22  8:38 AM  Result Value Ref Range   Lactic Acid, Venous 2.5 (HH) 0.5 - 1.9 mmol/L    Comment: CRITICAL RESULT CALLED TO, READ BACK BY AND VERIFIED WITH Alexismarie Flaim, M @0924$  ON 05/05/2022 BY Gloriann Loan Performed at [facility], [place] [address] [place], [place] [address]   Lactic acid, plasma     Status: Abnormal   Collection Time: 05/05/22 12:14 PM  Result Value Ref Range   Lactic Acid, Venous 2.3 (HH) 0.5 - 1.9 mmol/L    Comment: CRITICAL RESULT CALLED TO, READ BACK BY AND VERIFIED WITH DOWD, P @ 1352 ON 05/05/2022 BY Gloriann Loan Performed at [facility], [place] [address], [place], [place] [address]   Creatinine, serum     Status: None   Collection Time: 05/05/22 12:14 PM  Result Value Ref Range   Creatinine, Ser 0.74 0.44 - 1.00 mg/dL   GFR, Estimated >60 >60 mL/min    Comment: (NOTE) Calculated using the CKD-EPI Creatinine Equation (2021) Performed at [facility], [place] [address] [place], [place] [address]   CBC with Differential/Platelet     Status: Abnormal   Collection Time: 05/05/22 12:14 PM  Result Value Ref Range   WBC 17.1 (H) 4.0 - 10.5 K/uL   RBC 3.47 (L) 3.87 - 5.11 MIL/uL   Hemoglobin 9.6 (L) 12.0 - 15.0 g/dL   HCT 28.5 (L) 36.0 - 46.0 %   MCV 82.1 80.0 - 100.0 fL   MCH 27.7 26.0 - 34.0 pg   MCHC 33.7 30.0 - 36.0 g/dL   RDW 21.9 (H) 11.5 - 15.5 %   Platelets 142 (L) 150 - 400 K/uL   nRBC 0.0 0.0 - 0.2 %   Neutrophils Relative % 86 %   Neutro Abs 14.7 (H) 1.7 - 7.7 K/uL   Lymphocytes Relative 7 %   Lymphs  Abs 1.2 0.7 - 4.0 K/uL   Monocytes Relative 6 %   Monocytes Absolute 1.0 0.1 - 1.0 K/uL   Eosinophils Relative 0 %   Eosinophils Absolute 0.0 0.0 - 0.5 K/uL   Basophils Relative 0 %   Basophils Absolute 0.1 0.0 - 0.1 K/uL   Immature Granulocytes 1 %   Abs Immature Granulocytes 0.08 (H) 0.00 - 0.07 K/uL    Comment: Performed at [facility], [place] [address] [place], [place] [address]  CBG monitoring, ED     Status: Abnormal   Collection Time: 05/05/22  1:23 PM  Result Value Ref Range   Glucose-Capillary 243 (H) 70 - 99 mg/dL    Comment: Glucose reference range applies only to samples taken after fasting for at least 8 hours.  CBG monitoring, ED     Status: Abnormal   Collection Time: 05/05/22  5:30 PM  Result Value Ref Range   Glucose-Capillary 235 (H) 70 - 99 mg/dL    Comment: Glucose reference range applies only to samples taken after fasting for at least 8 hours.   DG Chest 2 View  Result Date: 05/05/2022 CLINICAL DATA:  Chest pain EXAM: CHEST - 2 VIEW COMPARISON:  02/25/2022 FINDINGS: Cardiac shadow is within normal limits. Lungs are well aerated bilaterally. Patchy airspace opacity is noted in the right upper lobe. No effusion is seen. No bony abnormality is noted. IMPRESSION: Early infiltrate in the right upper lobe. Electronically Signed   By: Inez Catalina M.D.   On: 05/05/2022 01:54  Pending Labs Unresulted Labs (From admission, onward)     Start     Ordered   05/06/22 0500  Comprehensive metabolic panel  Tomorrow morning,   R        05/05/22 0910   05/06/22 0500  CBC  Tomorrow morning,   R        05/05/22 0910   05/05/22 1602  HIV Antibody (routine testing w rflx)  Once,   R        05/05/22 1602   05/05/22 0908  CBC  (heparin)  Once,   R       Comments: Baseline for heparin therapy IF NOT ALREADY DRAWN.  Notify MD if PLT < 100 K.    05/05/22 0910   05/05/22 0806  Blood culture (routine x 2)  BLOOD CULTURE X 2,   R (with STAT occurrences)       05/05/22 0807            Vitals/Pain Today's Vitals   05/05/22 1500 05/05/22 1615 05/05/22 1730 05/05/22 1830  BP: 137/74 139/77 139/77 138/72  Pulse: (!) 105 (!) 109 (!) 113 (!) 110  Resp: 20 20 20 20  $ Temp:   98.9 F (37.2 C)   TempSrc:      SpO2: 99% 97% 98% 98%  Weight:      Height:      PainSc:        Isolation Precautions No active isolations  Medications Medications  0.9 %  sodium chloride infusion ( Intravenous New Bag/Given 05/05/22 1328)  ondansetron (ZOFRAN-ODT) disintegrating tablet 4 mg (has no administration in time range)  oxyCODONE-acetaminophen (PERCOCET/ROXICET) 5-325 MG per tablet 1 tablet (has no administration in time range)  amitriptyline (ELAVIL) tablet 25 mg (has no administration in time range)  pantoprazole (PROTONIX) EC tablet 40 mg (40 mg Oral Given 05/05/22 1402)  gabapentin (NEURONTIN) capsule 800 mg (800 mg Oral Given 05/05/22 1630)  heparin injection 5,000 Units (5,000 Units Subcutaneous Given 05/05/22 1401)  promethazine (PHENERGAN) tablet 12.5 mg (has no administration in time range)  insulin aspart (novoLOG) injection 0-9 Units (3 Units Subcutaneous Given 05/05/22 1734)  insulin aspart (novoLOG) injection 2 Units (2 Units Subcutaneous Given 05/05/22 1734)  Ampicillin-Sulbactam (UNASYN) 3 g in sodium chloride 0.9 % 100 mL IVPB (has no administration in time range)  ibuprofen (ADVIL) tablet 800 mg (has no administration in time range)  lactated ringers bolus 1,000 mL (1,000 mLs Intravenous Bolus 05/05/22 0613)  cefTRIAXone (ROCEPHIN) 1 g in sodium chloride 0.9 % 100 mL IVPB (0 g Intravenous Stopped 05/05/22 0704)  azithromycin (ZITHROMAX) tablet 500 mg (500 mg Oral Given 05/05/22 0612)  ibuprofen (ADVIL) tablet 800 mg (800 mg Oral Given 05/05/22 0612)  lactated ringers bolus 1,000 mL (0 mLs Intravenous Stopped 05/05/22 1001)    And  lactated ringers bolus 1,000 mL (0 mLs Intravenous Stopped 05/05/22 1001)    Mobility walks     Focused  Assessments Cardiac Assessment Handoff:  Cardiac Rhythm: Sinus tachycardia Lab Results  Component Value Date   CKTOTAL 47 01/02/2019   Lab Results  Component Value Date   DDIMER 0.65 (H) 06/03/2021   Does the Patient currently have chest pain? No   , Pulmonary Assessment Handoff:  Lung sounds:   O2 Device: Room Air      R Recommendations: See Admitting Provider Note  Report given to:   Additional Notes: .

## 2022-05-05 NOTE — ED Triage Notes (Signed)
Pt arrives via POV c/o chest pain. Pt sts centralized chest pain radiating down left arm. Pain ongoing since Tuesday. No aggravating factors. Also c/o SOB, chills, nausea, coughing, headache, dizzy/lightheaded, and weakness.

## 2022-05-05 NOTE — ED Provider Notes (Signed)
Care transferred from [facility], PA-C at time of sign out. See their note for full assessment.   Briefly: Patient is 42 y.o. female with history of DM who presents to the ED with concerns for chest pain.     Plan: Plan per previous PA-C: if tachycardia improves then outpatient and abx for PNA. If not admit for PNA.  Labs Reviewed  BASIC METABOLIC PANEL - Abnormal; Notable for the following components:      Result Value   Sodium 129 (*)    CO2 19 (*)    Glucose, Bld 303 (*)    Calcium 8.1 (*)    All other components within normal limits  URINALYSIS, ROUTINE W REFLEX MICROSCOPIC - Abnormal; Notable for the following components:   Color, Urine AMBER (*)    APPearance HAZY (*)    Protein, ur 100 (*)    Bacteria, UA FEW (*)    All other components within normal limits  CBC WITH DIFFERENTIAL/PLATELET - Abnormal; Notable for the following components:   WBC 15.5 (*)    Hemoglobin 11.0 (*)    HCT 32.0 (*)    RDW 21.2 (*)    Neutro Abs 13.8 (*)    Abs Immature Granulocytes 0.10 (*)    All other components within normal limits  CBG MONITORING, ED - Abnormal; Notable for the following components:   Glucose-Capillary 341 (*)    All other components within normal limits  RESP PANEL BY RT-PCR (RSV, FLU A&B, COVID)  [id number]  TROPONIN I (HIGH SENSITIVITY)    Clinical Course as of 05/05/22 0910  Sat May 05, 2022  0741 Pt evaluated and patient noted to still be tachycardic in the 120s following IV fluid bolus.  Patient also tachypneic at this time.  Lactic, blood cultures, IV fluids ordered. [SB]  [id number] Consult with Hospitalist, who requested ABG at this time.  Hospitalist will also evaluate the patient in the emergency department prior to consideration for admission with medicine or ICU. [SB]    Clinical Course User Index [SB] Mikhia Dusek A, PA-C     Presentation notable for pneumonia.  Also notable for likely developing sepsis.  Lactic, blood cultures, IV fluids ordered.   Discussed with patient plans for admission.  Patient agreeable at this time.  Patient appears safe for admission at this time.    This chart was dictated using voice recognition software, Dragon. Despite the best efforts of this provider to proofread and correct errors, errors may still occur which can change documentation meaning.   Sayvion Vigen A, PA-C 05/05/22 [id number]    Charlesetta Shanks, MD 05/10/22 [phone]

## 2022-05-05 NOTE — ED Notes (Signed)
Ambulated pt with Pulsox, pt maintained 94-96% RA with ambulation, pt expressed exertional shortness of breath and weakness following 2 minutes of ambulation.

## 2022-05-05 NOTE — Progress Notes (Addendum)
PHARMACY NOTE -  Unasyn  Pharmacy has been assisting with dosing of Unasyn for aspiration pneumonia. Dosage remains stable at 3g IV q6 hr and further renal adjustments per institutional Pharmacy antibiotic protocol Note Unasyn will not start until midnight tonight as patient already received Rocephin and azithromycin around 6a today (24 hr duration)  Pharmacy will sign off, following peripherally for culture results, dose adjustments, and length of therapy. Please reconsult if a change in clinical status warrants re-evaluation of dosage.  Reuel Boom, PharmD, BCPS [phone] 05/05/2022, 9:12 AM

## 2022-05-05 NOTE — ED Provider Notes (Signed)
[facility] Provider Note   CSN: [id number] Arrival date & time: 05/05/22  0032     History  Chief Complaint  Patient presents with   Chest Pain    Melinda Stone is a 42 y.o. female who presents the emergency department with 6 days of central and right-sided chest pain, body aches, shortness of breath, chills, nausea, NBNB emesis after coughing, headaches and lightheadedness.  No alleviating or exacerbating factors for pain, does not radiate.  Dyspnea on exertion.    No known ill contacts.  I personally reviewed her medical records.  Patient with history of type 2 diabetes, chronic pain syndrome, lumbar spinal stenosis, polysubstance use.  She is not anticoagulated.  HPI     Home Medications Prior to Admission medications   Medication Sig Start Date End Date Taking? Authorizing Provider  amitriptyline (ELAVIL) 25 MG tablet TAKE 1 TABLET(25 MG) BY MOUTH AT BEDTIME 05/01/22   Penumalli, Earlean Polka, MD  cyanocobalamin (VITAMIN B12) 1000 MCG tablet Take 1 tablet (1,000 mcg total) by mouth daily. 04/10/22   Earlie Server, MD  dicyclomine (BENTYL) 20 MG tablet Take 1 tablet (20 mg total) by mouth 2 (two) times daily. 10/31/21   Evlyn Courier, PA-C  empagliflozin (JARDIANCE) 10 MG TABS tablet Take 1 tablet (10 mg total) by mouth daily before breakfast. 04/16/22   Shamleffer, Melanie Crazier, MD  gabapentin (NEURONTIN) 800 MG tablet Take 800 mg by mouth in the morning, at noon, in the evening, and at bedtime. 04/04/21   [provider]  glipiZIDE (GLUCOTROL) 10 MG tablet Take 2 tablets (20 mg total) by mouth daily before breakfast AND 1 tablet (10 mg total) daily before supper. 10/11/21   Shamleffer, Melanie Crazier, MD  ondansetron (ZOFRAN-ODT) 4 MG disintegrating tablet Take 1 tablet (4 mg total) by mouth every 8 (eight) hours as needed for nausea or vomiting. 02/25/22   Auda Finfrock, Eugene Garnet R, PA-C  oxyCODONE-acetaminophen (PERCOCET/ROXICET)  5-325 MG tablet Take 1 tablet by mouth every 6 (six) hours as needed for moderate pain or severe pain. 03/13/21   [provider]  pantoprazole (PROTONIX) 40 MG tablet Take 1 tablet (40 mg total) by mouth daily. 06/12/21   Georgette Shell, MD  vitamin C (ASCORBIC ACID) 250 MG tablet Take 2 tablets (500 mg total) by mouth daily. 04/10/22   Earlie Server, MD      Allergies    Trulicity [dulaglutide]    Review of Systems   Review of Systems  Constitutional:  Positive for activity change, appetite change, chills, fatigue and fever.  HENT:  Positive for congestion and sore throat.   Eyes: Negative.   Respiratory:  Positive for cough and shortness of breath.   Gastrointestinal:  Positive for diarrhea, nausea and vomiting. Negative for abdominal pain.  Genitourinary: Negative.   Musculoskeletal:  Positive for myalgias.  Neurological:  Positive for light-headedness and headaches.    Physical Exam Updated Vital Signs BP 100/62 (BP Location: Left Arm)   Pulse (!) 117   Temp 99.7 F (37.6 C) (Oral)   Resp 18   Ht 5' 9"$  (1.753 m)   Wt 91.6 kg   SpO2 100%   BMI 29.83 kg/m  Physical Exam Vitals and nursing note reviewed.  Constitutional:      Appearance: She is ill-appearing. She is not toxic-appearing.  HENT:     Head: Normocephalic and atraumatic.     Mouth/Throat:     Mouth: Mucous membranes are moist.  Pharynx: No oropharyngeal exudate or posterior oropharyngeal erythema.  Eyes:     General:        Right eye: No discharge.        Left eye: No discharge.     Conjunctiva/sclera: Conjunctivae normal.  Cardiovascular:     Rate and Rhythm: Regular rhythm. Tachycardia present.     Pulses: Normal pulses.     Heart sounds: Normal heart sounds. No murmur heard. Pulmonary:     Effort: Pulmonary effort is normal. No respiratory distress.     Breath sounds: Examination of the right-upper field reveals rhonchi. Rhonchi present. No wheezing or rales.  Abdominal:     General:  Bowel sounds are normal. There is no distension.     Tenderness: There is no abdominal tenderness.  Musculoskeletal:        General: No deformity.     Cervical back: Neck supple.     Right lower leg: No tenderness. No edema.     Left lower leg: No tenderness. No edema.  Skin:    General: Skin is warm and dry.     Capillary Refill: Capillary refill takes less than 2 seconds.  Neurological:     General: No focal deficit present.     Mental Status: She is alert and oriented to person, place, and time. Mental status is at baseline.  Psychiatric:        Mood and Affect: Mood normal.     ED Results / Procedures / Treatments   Labs (all labs ordered are listed, but only abnormal results are displayed) Labs Reviewed  BASIC METABOLIC PANEL - Abnormal; Notable for the following components:      Result Value   Sodium 129 (*)    CO2 19 (*)    Glucose, Bld 303 (*)    Calcium 8.1 (*)    All other components within normal limits  URINALYSIS, ROUTINE W REFLEX MICROSCOPIC - Abnormal; Notable for the following components:   Color, Urine AMBER (*)    APPearance HAZY (*)    Protein, ur 100 (*)    Bacteria, UA FEW (*)    All other components within normal limits  CBC WITH DIFFERENTIAL/PLATELET - Abnormal; Notable for the following components:   WBC 15.5 (*)    Hemoglobin 11.0 (*)    HCT 32.0 (*)    RDW 21.2 (*)    Neutro Abs 13.8 (*)    Abs Immature Granulocytes 0.10 (*)    All other components within normal limits  CBG MONITORING, ED - Abnormal; Notable for the following components:   Glucose-Capillary 341 (*)    All other components within normal limits  RESP PANEL BY RT-PCR (RSV, FLU A&B, COVID)  [id number]  TROPONIN I (HIGH SENSITIVITY)    EKG None  Radiology DG Chest 2 View  Result Date: 05/05/2022 CLINICAL DATA:  Chest pain EXAM: CHEST - 2 VIEW COMPARISON:  02/25/2022 FINDINGS: Cardiac shadow is within normal limits. Lungs are well aerated bilaterally. Patchy airspace opacity is  noted in the right upper lobe. No effusion is seen. No bony abnormality is noted. IMPRESSION: Early infiltrate in the right upper lobe. Electronically Signed   By: Inez Catalina M.D.   On: 05/05/2022 01:54    Procedures Procedures    Medications Ordered in ED Medications  lactated ringers bolus 1,000 mL (1,000 mLs Intravenous Bolus 05/05/22 0613)  cefTRIAXone (ROCEPHIN) 1 g in sodium chloride 0.9 % 100 mL IVPB (0 g Intravenous Stopped 05/05/22 0704)  azithromycin (ZITHROMAX) tablet  500 mg (500 mg Oral Given 05/05/22 0612)  ibuprofen (ADVIL) tablet 800 mg (800 mg Oral Given 05/05/22 [id number])    ED Course/ Medical Decision Making/ A&P                             Medical Decision Making 42 year old female with history of cough, shortness of breath and viral syndrome symptoms x 6 days.  Mildly tachycardic on intake with heart rate of 107, vital signs otherwise normal.  Cardiac exam with persistent mild tachycardia to my evaluation.  Pulmonary exam with rhonchi in the right upper lobe, otherwise unremarkable.  Patient tachycardic to the 110s with ambulation, maintained oxygen saturations but was tachypneic to the 20s.  DDx includes not limited to ACS, PE, pleural effusion, pneumonia, pneumothorax, dysrhythmia.  CV  Amount and/or Complexity of Data Reviewed Labs: ordered.    Details: CBC with leukocytosis of 15,000, anemia with hemoglobin of 11 near patient's baseline.  BMP with hyponatremia 129, hyperglycemia of 303.  UA with proteinuria, otherwise unremarkable, RVP negative.  Troponin negative.  Radiology: ordered.    Details:  Chest x-ray visualized with provider with right upper quadrant infiltrate suspect early pneumonia. ECG/medicine tests:     Details:   EKG with sinus tachycardia, no STEMI.  Risk Prescription drug management.   Rocephin, azithromycin, and fluid bolus ordered.  At time of shift change care of this patient signed out to oncoming ED provider S.  Blue, PA-C.  All pertinent  HPI physical exam and laboratory findings were discussed with her primary pressure.  Disposition pending reevaluation after fluid bolus for resolution of tachycardia and improvement in tachypnea.  Low threshold for admission however disposition ultimately pending reevaluation and opinion of oncoming ED team.  Mahogani  voiced understanding of her medical evaluation and treatment plan. Each of their questions answered to their expressed satisfaction.  This chart was dictated using voice recognition software, Dragon. Despite the best efforts of this provider to proofread and correct errors, errors may still occur which can change documentation meaning.   Final diagnoses:  None    Rx / DC Orders ED Discharge Orders     None         Aura Dials 05/05/22 [id number]    Charlesetta Shanks, MD 05/05/22 [phone]

## 2022-05-05 NOTE — ED Notes (Signed)
Pt currently in restroom.

## 2022-05-06 DIAGNOSIS — R112 Nausea with vomiting, unspecified: Secondary | ICD-10-CM | POA: Diagnosis not present

## 2022-05-06 DIAGNOSIS — R079 Chest pain, unspecified: Secondary | ICD-10-CM | POA: Diagnosis not present

## 2022-05-06 LAB — C DIFFICILE QUICK SCREEN W PCR REFLEX
C Diff antigen: NEGATIVE
C Diff interpretation: NOT DETECTED
C Diff toxin: NEGATIVE

## 2022-05-06 LAB — BLOOD CULTURE ID PANEL (REFLEXED) - BCID2

## 2022-05-06 LAB — CBC
HCT: 24.1 % — ABNORMAL LOW (ref 36.0–46.0)
Hemoglobin: 8.3 g/dL — ABNORMAL LOW (ref 12.0–15.0)
MCH: 28.6 pg (ref 26.0–34.0)
MCHC: 34.4 g/dL (ref 30.0–36.0)
MCV: 83.1 fL (ref 80.0–100.0)
Platelets: 131 10*3/uL — ABNORMAL LOW (ref 150–400)
RBC: 2.9 MIL/uL — ABNORMAL LOW (ref 3.87–5.11)
RDW: 22.1 % — ABNORMAL HIGH (ref 11.5–15.5)
WBC: 12.9 10*3/uL — ABNORMAL HIGH (ref 4.0–10.5)
nRBC: 0 % (ref 0.0–0.2)

## 2022-05-06 LAB — COMPREHENSIVE METABOLIC PANEL
ALT: 19 U/L (ref 0–44)
AST: 20 U/L (ref 15–41)
Albumin: 3.3 g/dL — ABNORMAL LOW (ref 3.5–5.0)
Alkaline Phosphatase: 78 U/L (ref 38–126)
Anion gap: 8 (ref 5–15)
BUN: 11 mg/dL (ref 6–20)
CO2: 19 mmol/L — ABNORMAL LOW (ref 22–32)
Calcium: 8 mg/dL — ABNORMAL LOW (ref 8.9–10.3)
Chloride: 107 mmol/L (ref 98–111)
Creatinine, Ser: 0.62 mg/dL (ref 0.44–1.00)
GFR, Estimated: >60 mL/min (ref >60)
Glucose, Bld: 235 mg/dL — ABNORMAL HIGH (ref 70–99)
Potassium: 3.2 mmol/L — ABNORMAL LOW (ref 3.5–5.1)
Sodium: 134 mmol/L — ABNORMAL LOW (ref 135–145)
Total Bilirubin: 1.5 mg/dL — ABNORMAL HIGH (ref 0.3–1.2)
Total Protein: 6.3 g/dL — ABNORMAL LOW (ref 6.5–8.1)

## 2022-05-06 LAB — GLUCOSE, CAPILLARY
Glucose-Capillary: 265 mg/dL — ABNORMAL HIGH (ref 70–99)
Glucose-Capillary: 256 mg/dL — ABNORMAL HIGH (ref 70–99)
Glucose-Capillary: 204 mg/dL — ABNORMAL HIGH (ref 70–99)
Glucose-Capillary: 287 mg/dL — ABNORMAL HIGH (ref 70–99)

## 2022-05-06 LAB — HIV ANTIBODY (ROUTINE TESTING W REFLEX): HIV Screen 4th Generation wRfx: NONREACTIVE

## 2022-05-06 MED ORDER — GLIPIZIDE 10 MG PO TABS
20.0000 mg | ORAL_TABLET | Freq: Every day | ORAL | Status: DC
  Administered 2022-05-07: 20 mg via ORAL
  Filled 2022-05-06: qty 2

## 2022-05-06 MED ORDER — INSULIN GLARGINE-YFGN 100 UNIT/ML ~~LOC~~ SOLN
6.0000 [IU] | Freq: Every day | SUBCUTANEOUS | Status: DC
  Administered 2022-05-06 – 2022-05-07 (×2): 6 [IU] via SUBCUTANEOUS
  Filled 2022-05-06 (×2): qty 0.06

## 2022-05-06 MED ORDER — POTASSIUM CHLORIDE CRYS ER 20 MEQ PO TBCR
40.0000 meq | EXTENDED_RELEASE_TABLET | Freq: Every day | ORAL | Status: DC
  Administered 2022-05-07: 40 meq via ORAL
  Filled 2022-05-06 (×2): qty 2

## 2022-05-06 MED ORDER — POTASSIUM CHLORIDE IN NACL 40-0.9 MEQ/L-% IV SOLN
INTRAVENOUS | Status: DC
  Filled 2022-05-06 (×3): qty 1000

## 2022-05-06 MED ORDER — GUAIFENESIN-DM 100-10 MG/5ML PO SYRP
5.0000 mL | ORAL_SOLUTION | ORAL | Status: DC | PRN
  Administered 2022-05-06 – 2022-05-07 (×3): 5 mL via ORAL
  Filled 2022-05-06 (×3): qty 10

## 2022-05-06 MED ORDER — GLIPIZIDE 10 MG PO TABS
10.0000 mg | ORAL_TABLET | Freq: Every day | ORAL | Status: DC
  Administered 2022-05-06: 10 mg via ORAL
  Filled 2022-05-06: qty 1

## 2022-05-06 MED ORDER — LOPERAMIDE HCL 2 MG PO CAPS
4.0000 mg | ORAL_CAPSULE | Freq: Once | ORAL | Status: AC
  Administered 2022-05-06: 4 mg via ORAL
  Filled 2022-05-06: qty 2

## 2022-05-06 MED ORDER — TIZANIDINE HCL 4 MG PO TABS
2.0000 mg | ORAL_TABLET | Freq: Once | ORAL | Status: AC
  Administered 2022-05-06: 2 mg via ORAL
  Filled 2022-05-06: qty 1

## 2022-05-06 NOTE — Progress Notes (Signed)
Pt had type 7 stool with strong odor. Per pt stated, she had six or seven times watery stool and gas in past 24 hours. On call notified and RN wait for further instruction.

## 2022-05-06 NOTE — Progress Notes (Signed)
  Transition of Care [facility]) Screening Note   Patient Details  Name: Melinda Stone Date of Birth: 01-22-1981   Transition of Care [facility]) CM/SW Contact:    Henrietta Dine, RN Phone Number: 05/06/2022, 1:28 PM    Transition of Care Department [facility]) has reviewed patient and no TOC needs have been identified at this time. We will continue to monitor patient advancement through interdisciplinary progression rounds. If new patient transition needs arise, please place a TOC consult.

## 2022-05-06 NOTE — Progress Notes (Signed)
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 11/14/2020 and referred to neurology at the time on 2/14 for EMG studies   Present [facility] ED central chest pain radiating down left arm ongoing Tuesday SOB chills nausea cough She tells me she had developed "sinus" issues in the past 2 to 3 days which is unusual for her-she usually gets this in the spring-she was not around any sick contacts that she remembers and progressed to have further postnasal drip She has been vomiting over the past day and this seems to have been posttussive She states that the chest pain is not severe nonradiating  Admitted with aspiration pneumonia 2/17  Hospital-Problem based course  Aspiration pneumonia -Continue Unasyn every 6, monitor trends of cough etc. -WBC improved, de-escalate antibiotics in the next several days  Diarrhea, likely related to antibiotics - C. difficile is negative-discontinue contact - May give Imodium x 1 @ 47m - If continues to have diarrhea would get pathogen panel  Hyponatremia on admission-resolved Hypokalemia today - Patient gets "jerks" with potassium, I have informed her it is risky to have low potassium as it can cause arrhythmia - Start NS +40 K check labs + magnesium in a.m.  DM TY 1+ retinopathy + neuropathy last A1c 7.0 - All oral meds on hold CBG = 250 range, start long-acting 6 units daily -Hold Jardiance given infection, may resume glipizide 20 a.m., 10 this p.m.  Known failed lumbar syndrome status post microdiscectomy 2022 - Continue Eliquis, gabapentin 800 3 times daily,  oxycodone 6 as needed pain  COPD + lung nodules in 2023 - Requires outpatient low-dose CT chest in 6 months  DVT prophylaxis: Heparin Code Status: Full Family Communication: None Disposition:  Status is: Inpatient Remains inpatient appropriate because:   Has pneumonia has hypokalemia qualifies for inpatient stay at least another 48 hours    Subjective: Did not sleep well last night-initially refused potassium but we spoke about it and I explained to her the risk of arrhythmia and she is willing to take it Has poor IV access No pleurisy no chest pain no chills no sputum no nausea no vomiting 7 episodes of diarrhea??-C. difficile is negative I do not think she has any other underlying illness such as GI virus so we will hold pathogen panel  Objective: Vitals:   05/05/22 1830 05/05/22 2001 05/06/22 0028 05/06/22 0345  BP: 138/72 122/65 117/70 128/72  Pulse: (!) 110 (!) 104 89 96  Resp: 20 18 20 20  $ Temp:  99.2 F (37.3 C) 98 F (36.7 C) 98.4 F (36.9 C)  TempSrc:  Oral Oral Oral  SpO2: 98% 99% 99% 99%  Weight:      Height:        Intake/Output Summary (Last 24 hours) at 05/06/2022 1217 Last data filed at 05/06/2022 0330 Gross per 24 hour  Intake 1000 ml  Output --  Net 1000 ml   Filed Weights   05/05/22 0052  Weight: 91.6 kg    Examination:  EOMI tired appearing black female neck soft supple No icterus no pallor Mallampati 2 Chest is clear with mild rales in the posterior lateral lung field on the right side  Abdomen is soft no real guarding No lower extremity edema Neurologically intact Psych is euthymic  Data Reviewed: personally reviewed   CBC    Component Value Date/Time   WBC 12.9 (H) 05/06/2022 0501   RBC 2.90 (L) 05/06/2022 0501   HGB 8.3 (L) 05/06/2022 0501   HGB 9.4 (L) 01/29/2019 1744   HCT 24.1 (L) 05/06/2022 0501   HCT 28.5 (L) 01/29/2019 1744   PLT 131 (L) 05/06/2022 0501   PLT 203 01/29/2019 1744   MCV 83.1 05/06/2022 0501   MCV 74 (L)  01/29/2019 1744   MCH 28.6 05/06/2022 0501   MCHC 34.4 05/06/2022 0501   RDW 22.1 (H) 05/06/2022 0501   RDW 19.0 (H) 01/29/2019 1744   LYMPHSABS 1.2 05/05/2022 1214   LYMPHSABS 2.0 10/20/2018 1002   MONOABS 1.0 05/05/2022 1214   EOSABS 0.0 05/05/2022 1214   EOSABS 0.1 10/20/2018 1002   BASOSABS 0.1 05/05/2022 1214   BASOSABS 0.1 10/20/2018 1002      Latest Ref Rng & Units 05/06/2022    5:01 AM 05/05/2022   12:14 PM 05/05/2022    1:50 AM  CMP  Glucose 70 - 99 mg/dL 235   303   BUN 6 - 20 mg/dL 11   8   Creatinine 0.44 - 1.00 mg/dL 0.62  0.74  0.66   Sodium 135 - 145 mmol/L 134   129   Potassium 3.5 - 5.1 mmol/L 3.2   3.5   Chloride 98 - 111 mmol/L 107   101   CO2 22 - 32 mmol/L 19   19   Calcium 8.9 - 10.3 mg/dL 8.0   8.1   Total Protein 6.5 - 8.1 g/dL 6.3     Total Bilirubin 0.3 - 1.2 mg/dL 1.5     Alkaline Phos 38 - 126 U/L 78     AST 15 - 41 U/L 20     ALT 0 - 44 U/L 19        Radiology Studies: DG Chest 2 View  Result Date: 05/05/2022 CLINICAL DATA:  Chest pain EXAM: CHEST - 2 VIEW COMPARISON:  02/25/2022 FINDINGS: Cardiac shadow is within normal limits. Lungs are well aerated bilaterally. Patchy airspace opacity is noted in the right upper lobe. No effusion is seen. No bony abnormality is noted. IMPRESSION: Early infiltrate in the right upper lobe. Electronically Signed   By: Inez Catalina M.D.   On: 05/05/2022 01:54     Scheduled Meds:  amitriptyline  25 mg Oral QHS   gabapentin  800 mg Oral TID   heparin  5,000 Units Subcutaneous Q8H   insulin aspart  0-9 Units Subcutaneous TID WC   insulin aspart  2 Units Subcutaneous TID WC   pantoprazole  40 mg Oral Daily   potassium chloride  40 mEq Oral Daily   Continuous Infusions:  0.9 % NaCl with KCl 40 mEq / L     ampicillin-sulbactam (UNASYN) IV 3 g (05/06/22 0526)     LOS: 1 day   Time spent: 34 minutes  Nita Sells, MD [facility] To contact the attending provider between 7A-7P or the covering  provider during after hours 7P-7A, please log into the web site [email or web address] and access using universal  password for that web site. If you do not have the password, please call the hospital operator.  05/06/2022, 12:18 PM

## 2022-05-06 NOTE — Progress Notes (Signed)
Pt refused PO potassium. Education provided on the risk and benefits of her decision. Attending MD notified.

## 2022-05-06 NOTE — Progress Notes (Signed)
PHARMACY - PHYSICIAN COMMUNICATION CRITICAL VALUE ALERT - BLOOD CULTURE IDENTIFICATION (BCID)  Melinda Stone is an 42 y.o. female who presented to [facility] on 05/05/2022 with a chief complaint of chest pain, SOB, chills, nausea, cough  Assessment:  1/4 BCx bottles growing methicillin-resistant S epi (possibly contaminant)  Name of physician (or Provider) Contacted: Samtani  Current antibiotics: Unasyn 3g IV q8 hr  Changes to prescribed antibiotics recommended: Continue Unasyn for now as patient improving per MD report. If worsens can consider adding vancomycin or reaching out to ID service. Patient is on recommended antibiotics - No changes needed  Results for orders placed or performed during the hospital encounter of 05/05/22  Blood Culture ID Panel (Reflexed) (Collected: 05/05/2022  8:38 AM)  Result Value Ref Range   Enterococcus faecalis NOT DETECTED NOT DETECTED   Enterococcus Faecium NOT DETECTED NOT DETECTED   Listeria monocytogenes NOT DETECTED NOT DETECTED   Staphylococcus species DETECTED (A) NOT DETECTED   Staphylococcus aureus (BCID) NOT DETECTED NOT DETECTED   Staphylococcus epidermidis DETECTED (A) NOT DETECTED   Staphylococcus lugdunensis NOT DETECTED NOT DETECTED   Streptococcus species NOT DETECTED NOT DETECTED   Streptococcus agalactiae NOT DETECTED NOT DETECTED   Streptococcus pneumoniae NOT DETECTED NOT DETECTED   Streptococcus pyogenes NOT DETECTED NOT DETECTED   A.calcoaceticus-baumannii NOT DETECTED NOT DETECTED   Bacteroides fragilis NOT DETECTED NOT DETECTED   Enterobacterales NOT DETECTED NOT DETECTED   Enterobacter cloacae complex NOT DETECTED NOT DETECTED   Escherichia coli NOT DETECTED NOT DETECTED   Klebsiella aerogenes NOT DETECTED NOT DETECTED   Klebsiella oxytoca NOT DETECTED NOT DETECTED   Klebsiella pneumoniae NOT DETECTED NOT DETECTED   Proteus species NOT DETECTED NOT DETECTED   Salmonella species NOT DETECTED NOT DETECTED    Serratia marcescens NOT DETECTED NOT DETECTED   Haemophilus influenzae NOT DETECTED NOT DETECTED   Neisseria meningitidis NOT DETECTED NOT DETECTED   Pseudomonas aeruginosa NOT DETECTED NOT DETECTED   Stenotrophomonas maltophilia NOT DETECTED NOT DETECTED   Candida albicans NOT DETECTED NOT DETECTED   Candida auris NOT DETECTED NOT DETECTED   Candida glabrata NOT DETECTED NOT DETECTED   Candida krusei NOT DETECTED NOT DETECTED   Candida parapsilosis NOT DETECTED NOT DETECTED   Candida tropicalis NOT DETECTED NOT DETECTED   Cryptococcus neoformans/gattii NOT DETECTED NOT DETECTED   Methicillin resistance mecA/C DETECTED (A) NOT DETECTED    [name] A 05/06/2022  4:49 PM

## 2022-05-06 NOTE — Progress Notes (Signed)
       Overnight   NAME: Melinda Stone MRN: [id number] DOB : 1980/08/30    Date of Service   05/06/2022   HPI/Events of Note   Notified by RN for recurring watery stools.  "6 or 7 watery stool in past 24 hours."      Interventions/ Plan   Stool sample for C -Diff Precautions for now      Gershon Cull BSN MSNA MSN [place]

## 2022-05-07 ENCOUNTER — Other Ambulatory Visit (HOSPITAL_COMMUNITY): Payer: Self-pay

## 2022-05-07 DIAGNOSIS — R112 Nausea with vomiting, unspecified: Secondary | ICD-10-CM | POA: Diagnosis not present

## 2022-05-07 DIAGNOSIS — R079 Chest pain, unspecified: Secondary | ICD-10-CM | POA: Diagnosis not present

## 2022-05-07 LAB — CBC WITH DIFFERENTIAL/PLATELET
Abs Immature Granulocytes: 0.04 10*3/uL (ref 0.00–0.07)
Basophils Absolute: 0.1 10*3/uL (ref 0.0–0.1)
Basophils Relative: 1 %
Eosinophils Absolute: 0.1 10*3/uL (ref 0.0–0.5)
Eosinophils Relative: 1 %
HCT: 21.9 % — ABNORMAL LOW (ref 36.0–46.0)
Hemoglobin: 7.3 g/dL — ABNORMAL LOW (ref 12.0–15.0)
Immature Granulocytes: 1 %
Lymphocytes Relative: 19 %
Lymphs Abs: 1.7 10*3/uL (ref 0.7–4.0)
MCH: 28.1 pg (ref 26.0–34.0)
MCHC: 33.3 g/dL (ref 30.0–36.0)
MCV: 84.2 fL (ref 80.0–100.0)
Monocytes Absolute: 0.6 10*3/uL (ref 0.1–1.0)
Monocytes Relative: 7 %
Neutro Abs: 6.4 10*3/uL (ref 1.7–7.7)
Neutrophils Relative %: 71 %
Platelets: 126 10*3/uL — ABNORMAL LOW (ref 150–400)
RBC: 2.6 MIL/uL — ABNORMAL LOW (ref 3.87–5.11)
RDW: 22.3 % — ABNORMAL HIGH (ref 11.5–15.5)
WBC: 8.9 10*3/uL (ref 4.0–10.5)
nRBC: 0 % (ref 0.0–0.2)

## 2022-05-07 LAB — COMPREHENSIVE METABOLIC PANEL
ALT: 18 U/L (ref 0–44)
AST: 18 U/L (ref 15–41)
Albumin: 2.9 g/dL — ABNORMAL LOW (ref 3.5–5.0)
Alkaline Phosphatase: 77 U/L (ref 38–126)
Anion gap: 7 (ref 5–15)
BUN: 7 mg/dL (ref 6–20)
CO2: 21 mmol/L — ABNORMAL LOW (ref 22–32)
Calcium: 7.8 mg/dL — ABNORMAL LOW (ref 8.9–10.3)
Chloride: 110 mmol/L (ref 98–111)
Creatinine, Ser: 0.53 mg/dL (ref 0.44–1.00)
GFR, Estimated: >60 mL/min (ref >60)
Glucose, Bld: 206 mg/dL — ABNORMAL HIGH (ref 70–99)
Potassium: 3.9 mmol/L (ref 3.5–5.1)
Sodium: 138 mmol/L (ref 135–145)
Total Bilirubin: 1.2 mg/dL (ref 0.3–1.2)
Total Protein: 5.7 g/dL — ABNORMAL LOW (ref 6.5–8.1)

## 2022-05-07 LAB — CULTURE, BLOOD (ROUTINE X 2): Special Requests: ADEQUATE

## 2022-05-07 LAB — MAGNESIUM: Magnesium: 2 mg/dL (ref 1.7–2.4)

## 2022-05-07 LAB — GLUCOSE, CAPILLARY: Glucose-Capillary: 211 mg/dL — ABNORMAL HIGH (ref 70–99)

## 2022-05-07 MED ORDER — GUAIFENESIN-DM 100-10 MG/5ML PO SYRP: 5.0000 mL | ORAL_SOLUTION | ORAL | 0 refills | Status: DC | PRN

## 2022-05-07 MED ORDER — AMOXICILLIN-POT CLAVULANATE 875-125 MG PO TABS: 1.0000 | ORAL_TABLET | Freq: Two times a day (BID) | ORAL | Status: DC

## 2022-05-07 MED ORDER — AMOXICILLIN-POT CLAVULANATE 875-125 MG PO TABS
1.0000 | ORAL_TABLET | Freq: Two times a day (BID) | ORAL | 0 refills | Status: DC
  Filled 2022-05-07: qty 12, 6d supply, fill #0

## 2022-05-07 MED ORDER — GUAIFENESIN-DM 100-10 MG/5ML PO SYRP
5.0000 mL | ORAL_SOLUTION | ORAL | 0 refills | Status: DC | PRN
  Filled 2022-05-07 – 2022-08-31 (×2): qty 118, 4d supply, fill #0

## 2022-05-07 NOTE — Progress Notes (Signed)
Mobility Specialist - Progress Note  Pre-mobility: 85 bpm HR, 100% SpO2 During mobility: 105 bpm HR, 98% SpO2 Post-mobility: 99 bpm HR, 100% SPO2   05/07/22 1003  Oxygen Therapy  O2 Device Room Air  Patient Activity (if Appropriate) Ambulating  Mobility  Activity Ambulated independently in hallway  Level of Assistance Independent  Assistive Device None  Distance Ambulated (ft) 500 ft  Range of Motion/Exercises Active  Activity Response Tolerated well  $Mobility charge 1 Mobility   Pt was found sitting EOB and agreeable to ambulate. Stated having no complaints during session and at EOS returned to room with NT in room.  Ferd Hibbs Mobility Specialist

## 2022-05-07 NOTE — Discharge Summary (Signed)
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 11/14/2020 and referred to neurology at the time on 2/14 for EMG studies   Present [facility] central chest pain radiating down left arm ongoing Tuesday SOB chills nausea cough She tells me she had developed "sinus" issues in the past 2 to 3 days which is unusual for her-she usually gets this in the spring-she was not around any sick contacts that she remembers and progressed to have further postnasal drip She has been vomiting over the past day and this seems to have been posttussive She states that the chest pain is not severe nonradiating requires low-dose CT in 6 to 8 months per PCP  Hospital Course:  Aspiration pneumonia - Initially on Unasyn  transition to Augmentin and she will complete a circumscribe course -WBC improved -Requires chest x-ray as above and labs   Diarrhea, likely related to antibiotics - C. difficile is negative-discontinue contact - May give Imodium x 1 @ 67m -Self resolved with no further issues   Hyponatremia on admission-resolved Hypokalemia today - Patient gets "jerks" with potassium, I have informed her it is risky to have low potassium as it can cause arrhythmia - Replaced repleted IV and normalized on discharge   DM TY 1+ retinopathy + neuropathy last A1c 7.0 - \Patient was on sliding scale coverage I resumed the Jardiance as I felt her infection was under relatively good control - Should follow-up with Dr. SKelton Pillarin the outpatient setting in about 2 months to reassess glycemic control -Please ensure gets retinopathy and microalbuminuria at Hospital follow-up   Known failed lumbar syndrome status post microdiscectomy 2022 - Continue Eliquis, gabapentin 800 3 times daily, oxycodone 6 as needed pain   COPD + lung nodules in 2023 - Requires outpatient low-dose CT chest in 6 months   Discharge Exam: Vitals:   05/07/22 1004 05/07/22 1007  BP: 134/75 126/83  Pulse: 90 88  Resp:    Temp:  97.6 F (36.4 C)  SpO2: 100% 100%    Subj on day of d/c   Awake coherent no distress  General Exam on discharge  Sleepy black female with slightly flat affect Ambulated 500 feet looked comfortable I personally visualized her doing so with good gait Chest clear no rales rhonchi wheeze Abdomen soft no rebound ROM intact Multiple tattoos Flat affect  Discharge Instructions   Discharge Instructions     Diet - low sodium heart healthy   Complete by: As  directed    Discharge instructions   Complete by: As directed    Please make sure that you complete the full course of Augmentin as you had an impressive pneumonia on admission and were quite sick when you were first admitted We will prescribe  you a cough syrup as well to help suppress the cough and you can take this for short period of time I would recommend that you also take a probiotic such as plain yogurt to help prevent any side effects from the Augmentin Please follow-up with your pain physician for follow-up and refills on your meds   Increase activity slowly   Complete by: As directed       Allergies as of 05/07/2022       Reactions   Trulicity [dulaglutide] Diarrhea        Medication List     TAKE these medications    amitriptyline 25 MG tablet Commonly known as: ELAVIL TAKE 1 TABLET(25 MG) BY MOUTH AT BEDTIME What changed: See the new instructions.   amoxicillin-clavulanate 875-125 MG tablet Commonly known as: AUGMENTIN Take 1 tablet by mouth every 12 (twelve) hours for 6 days.   cyanocobalamin 1000 MCG tablet Commonly known as: VITAMIN B12 Take 1 tablet (1,000 mcg total) by mouth daily.   dicyclomine 20 MG tablet Commonly known as: BENTYL Take 1 tablet (20 mg total) by mouth 2 (two) times daily.   empagliflozin 10 MG Tabs tablet Commonly known as: Jardiance Take 1 tablet (10 mg total) by mouth daily before breakfast.   gabapentin 800 MG tablet Commonly known as: NEURONTIN Take 800 mg by mouth in the morning, at noon, in the evening, and at bedtime.   glipiZIDE 10 MG tablet Commonly known as: GLUCOTROL Take 2 tablets (20 mg total) by mouth daily before breakfast AND 1 tablet (10 mg total) daily before supper.   guaiFENesin-dextromethorphan 100-10 MG/5ML syrup Commonly known as: ROBITUSSIN DM Take 5 mLs by mouth every 4 (four) hours as needed for cough.   ondansetron 4 MG disintegrating tablet Commonly known as: ZOFRAN-ODT Take 1 tablet (4 mg total) by mouth every 8 (eight) hours as needed for nausea or vomiting.   oxyCODONE-acetaminophen 5-325 MG tablet Commonly known as: PERCOCET/ROXICET Take 1 tablet by mouth every 6 (six) hours as needed for moderate pain or severe pain.    pantoprazole 40 MG tablet Commonly known as: PROTONIX Take 1 tablet (40 mg total) by mouth daily.   prednisoLONE acetate 1 % ophthalmic suspension Commonly known as: PRED FORTE Place 1 drop into the left eye 4 (four) times daily.   tiZANidine 4 MG tablet Commonly known as: ZANAFLEX Take 4 mg by mouth 2 (two) times daily.   vitamin C 250 MG tablet Commonly known as: ASCORBIC ACID Take 2 tablets (500 mg total) by mouth daily.       Allergies  Allergen Reactions   Trulicity [Dulaglutide] Diarrhea      The results of significant diagnostics from this hospitalization (including imaging, microbiology, ancillary and laboratory) are listed below for reference.    Significant Diagnostic Studies: DG Chest 2 View  Result Date: 05/05/2022 CLINICAL DATA:  Chest pain EXAM: CHEST - 2 VIEW COMPARISON:  02/25/2022 FINDINGS: Cardiac shadow is within normal limits. Lungs are well aerated bilaterally. Patchy airspace opacity is noted in the right upper lobe. No effusion is seen. No bony abnormality is noted. IMPRESSION: Early infiltrate in the right upper lobe. Electronically Signed   By: Inez Catalina M.D.   On:  05/05/2022 01:54    Microbiology: Recent Results (from the past 240 hour(s))  Resp panel by RT-PCR (RSV, Flu A&B, Covid) Anterior Nasal Swab     Status: None   Collection Time: 05/05/22  1:49 AM   Specimen: Anterior Nasal Swab  Result Value Ref Range Status   SARS Coronavirus 2 by RT PCR NEGATIVE NEGATIVE Final    Comment: (NOTE) SARS-CoV-2 target nucleic acids are NOT DETECTED.  The SARS-CoV-2 RNA is generally detectable in upper respiratory specimens during the acute phase of infection. The lowest concentration of SARS-CoV-2 viral copies this assay can detect is 138 copies/mL. A negative result does not preclude SARS-Cov-2 infection and should not be used as the sole basis for treatment or other patient management decisions. A negative result may occur with  improper specimen  collection/handling, submission of specimen other than nasopharyngeal swab, presence of viral mutation(s) within the areas targeted by this assay, and inadequate number of viral copies(<138 copies/mL). A negative result must be combined with clinical observations, patient history, and epidemiological information. The expected result is Negative.  Fact Sheet for Patients:  EntrepreneurPulse.com.au  Fact Sheet for Healthcare Providers:  IncredibleEmployment.be  This test is no t yet approved or cleared by the [place] FDA and  has been authorized for detection and/or diagnosis of SARS-CoV-2 by FDA under an Emergency Use Authorization (EUA). This EUA will remain  in effect (meaning this test can be used) for the duration of the COVID-19 declaration under Section 564(b)(1) of the Act, 21 U.S.C.section 360bbb-3(b)(1), unless the authorization is terminated  or revoked sooner.       Influenza A by PCR NEGATIVE NEGATIVE Final   Influenza B by PCR NEGATIVE NEGATIVE Final    Comment: (NOTE) The Xpert Xpress SARS-CoV-2/FLU/RSV plus assay is intended as an aid in the diagnosis of influenza from Nasopharyngeal swab specimens and should not be used as a sole basis for treatment. Nasal washings and aspirates are unacceptable for Xpert Xpress SARS-CoV-2/FLU/RSV testing.  Fact Sheet for Patients: EntrepreneurPulse.com.au  Fact Sheet for Healthcare Providers: IncredibleEmployment.be  This test is not yet approved or cleared by the [place] FDA and has been authorized for detection and/or diagnosis of SARS-CoV-2 by FDA under an Emergency Use Authorization (EUA). This EUA will remain in effect (meaning this test can be used) for the duration of the COVID-19 declaration under Section 564(b)(1) of the Act, 21 U.S.C. section 360bbb-3(b)(1), unless the authorization is terminated or revoked.     Resp Syncytial  Virus by PCR NEGATIVE NEGATIVE Final    Comment: (NOTE) Fact Sheet for Patients: EntrepreneurPulse.com.au  Fact Sheet for Healthcare Providers: IncredibleEmployment.be  This test is not yet approved or cleared by the [place] FDA and has been authorized for detection and/or diagnosis of SARS-CoV-2 by FDA under an Emergency Use Authorization (EUA). This EUA will remain in effect (meaning this test can be used) for the duration of the COVID-19 declaration under Section 564(b)(1) of the Act, 21 U.S.C. section 360bbb-3(b)(1), unless the authorization is terminated or revoked.  Performed at [facility], [place] [address], [place], [place] [address]   Blood culture (routine x 2)     Status: None (Preliminary result)   Collection Time: 05/05/22  8:11 AM   Specimen: BLOOD  Result Value Ref Range Status   Specimen Description   Final    BLOOD LEFT ANTECUBITAL Performed at [facility] [address] [place], [place] [address]    Special Requests   Final  BOTTLES DRAWN AEROBIC AND ANAEROBIC Blood Culture adequate volume Performed at [facility] [address], [place], [place] [address]    Culture   Final    NO GROWTH 2 DAYS Performed at [facility] [address] [place], [place] [address]    Report Status PENDING  Incomplete  Blood culture (routine x 2)     Status: Abnormal   Collection Time: 05/05/22  8:38 AM   Specimen: BLOOD  Result Value Ref Range Status   Specimen Description   Final    BLOOD RIGHT ANTECUBITAL Performed at [facility] [address] [place], [place] [address]    Special Requests   Final    BOTTLES DRAWN AEROBIC AND ANAEROBIC Blood Culture adequate volume Performed at [facility] [address], [place], [place] [address]    Culture  Setup Time   Final    AEROBIC BOTTLE ONLY GRAM POSITIVE COCCI IN  CLUSTERS CRITICAL RESULT CALLED TO, READ BACK BY AND VERIFIED WITH:  C/ PHARMD D. WOFFORD 05/06/22 1529 A. LAFRANCE    Culture (A)  Final    STAPHYLOCOCCUS EPIDERMIDIS THE SIGNIFICANCE OF ISOLATING THIS ORGANISM FROM A SINGLE SET OF BLOOD CULTURES WHEN MULTIPLE SETS ARE DRAWN IS UNCERTAIN. PLEASE NOTIFY THE MICROBIOLOGY DEPARTMENT WITHIN ONE WEEK IF SPECIATION AND SENSITIVITIES ARE REQUIRED. Performed at [facility], [place] [address], [place], [place] [address]    Report Status 05/07/2022 FINAL  Final  Blood Culture ID Panel (Reflexed)     Status: Abnormal   Collection Time: 05/05/22  8:38 AM  Result Value Ref Range Status   Enterococcus faecalis NOT DETECTED NOT DETECTED Final   Enterococcus Faecium NOT DETECTED NOT DETECTED Final   Listeria monocytogenes NOT DETECTED NOT DETECTED Final   Staphylococcus species DETECTED (A) NOT DETECTED Final    Comment: CRITICAL RESULT CALLED TO, READ BACK BY AND VERIFIED WITH:  C/ PHARMD D. WOFFORD 05/06/22 1529 A. LAFRANCE    Staphylococcus aureus (BCID) NOT DETECTED NOT DETECTED Final   Staphylococcus epidermidis DETECTED (A) NOT DETECTED Final    Comment: Methicillin (oxacillin) resistant coagulase negative staphylococcus. Possible blood culture contaminant (unless isolated from more than one blood culture draw or clinical case suggests pathogenicity). No antibiotic treatment is indicated for blood  culture contaminants. CRITICAL RESULT CALLED TO, READ BACK BY AND VERIFIED WITH:  C/ PHARMD D. WOFFORD 05/06/22 1529 A. LAFRANCE    Staphylococcus lugdunensis NOT DETECTED NOT DETECTED Final   Streptococcus species NOT DETECTED NOT DETECTED Final   Streptococcus agalactiae NOT DETECTED NOT DETECTED Final   Streptococcus pneumoniae NOT DETECTED NOT DETECTED Final   Streptococcus pyogenes NOT DETECTED NOT DETECTED Final   A.calcoaceticus-baumannii NOT DETECTED NOT DETECTED Final   Bacteroides fragilis NOT DETECTED NOT DETECTED Final    Enterobacterales NOT DETECTED NOT DETECTED Final   Enterobacter cloacae complex NOT DETECTED NOT DETECTED Final   Escherichia coli NOT DETECTED NOT DETECTED Final   Klebsiella aerogenes NOT DETECTED NOT DETECTED Final   Klebsiella oxytoca NOT DETECTED NOT DETECTED Final   Klebsiella pneumoniae NOT DETECTED NOT DETECTED Final   Proteus species NOT DETECTED NOT DETECTED Final   Salmonella species NOT DETECTED NOT DETECTED Final   Serratia marcescens NOT DETECTED NOT DETECTED Final   Haemophilus influenzae NOT DETECTED NOT DETECTED Final   Neisseria meningitidis NOT DETECTED NOT DETECTED Final   Pseudomonas aeruginosa NOT DETECTED NOT DETECTED Final   Stenotrophomonas maltophilia NOT DETECTED NOT DETECTED Final  Candida albicans NOT DETECTED NOT DETECTED Final   Candida auris NOT DETECTED NOT DETECTED Final   Candida glabrata NOT DETECTED NOT DETECTED Final   Candida krusei NOT DETECTED NOT DETECTED Final   Candida parapsilosis NOT DETECTED NOT DETECTED Final   Candida tropicalis NOT DETECTED NOT DETECTED Final   Cryptococcus neoformans/gattii NOT DETECTED NOT DETECTED Final   Methicillin resistance mecA/C DETECTED (A) NOT DETECTED Final    Comment: CRITICAL RESULT CALLED TO, READ BACK BY AND VERIFIED WITH:  C/ PHARMD D. WOFFORD 05/06/22 1529 A. LAFRANCE Performed at [facility], [place] [address], [place], [place] [address]   C Difficile Quick Screen w PCR reflex     Status: None   Collection Time: 05/06/22  8:54 AM   Specimen: STOOL  Result Value Ref Range Status   C Diff antigen NEGATIVE NEGATIVE Final   C Diff toxin NEGATIVE NEGATIVE Final   C Diff interpretation No C. difficile detected.  Final    Comment: Performed at [facility], [place] [address], [place], [place] [address]     Labs: Basic Metabolic Panel: Recent Labs  Lab 05/05/22 0150 05/05/22 1214 05/06/22 0501 05/07/22 0437  NA 129*  --  134* 138  K 3.5  --  3.2* 3.9  CL 101  --  107 110   CO2 19*  --  19* 21*  GLUCOSE 303*  --  235* 206*  BUN 8  --  11 7  CREATININE 0.66 0.74 0.62 0.53  CALCIUM 8.1*  --  8.0* 7.8*  MG  --   --   --  2.0   Liver Function Tests: Recent Labs  Lab 05/06/22 0501 05/07/22 0437  AST 20 18  ALT 19 18  ALKPHOS 78 77  BILITOT 1.5* 1.2  PROT 6.3* 5.7*  ALBUMIN 3.3* 2.9*   No results for input(s): "LIPASE", "AMYLASE" in the last 168 hours. No results for input(s): "AMMONIA" in the last 168 hours. CBC: Recent Labs  Lab 05/05/22 0150 05/05/22 1214 05/06/22 0501 05/07/22 0437  WBC 15.5* 17.1* 12.9* 8.9  NEUTROABS 13.8* 14.7*  --  6.4  HGB 11.0* 9.6* 8.3* 7.3*  HCT 32.0* 28.5* 24.1* 21.9*  MCV 82.3 82.1 83.1 84.2  PLT 154 142* 131* 126*   Cardiac Enzymes: No results for input(s): "CKTOTAL", "CKMB", "CKMBINDEX", "TROPONINI" in the last 168 hours. BNP: BNP (last 3 results) No results for input(s): "BNP" in the last 8760 hours.  ProBNP (last 3 results) No results for input(s): "PROBNP" in the last 8760 hours.  CBG: Recent Labs  Lab 05/06/22 0743 05/06/22 1220 05/06/22 1645 05/06/22 2117 05/07/22 0735  GLUCAP 265* 256* 204* 287* 211*       Signed:  Nita Sells MD   [facility] 05/07/2022, 10:09 AM

## 2022-05-07 NOTE — Plan of Care (Signed)
  Problem: Activity: Goal: Ability to tolerate increased activity will improve Outcome: Progressing   Problem: Clinical Measurements: Goal: Ability to maintain a body temperature in the normal range will improve Outcome: Progressing   Problem: Respiratory: Goal: Ability to maintain adequate ventilation will improve Outcome: Progressing   

## 2022-05-08 ENCOUNTER — Other Ambulatory Visit (HOSPITAL_COMMUNITY): Payer: Self-pay

## 2022-05-08 ENCOUNTER — Telehealth: Payer: Self-pay

## 2022-05-08 NOTE — Telephone Encounter (Signed)
Patient called in and states that Jardiance needs a PA.

## 2022-05-10 LAB — CULTURE, BLOOD (ROUTINE X 2)
Culture: NO GROWTH
Special Requests: ADEQUATE

## 2022-05-13 ENCOUNTER — Encounter (HOSPITAL_COMMUNITY): Payer: Self-pay

## 2022-05-13 ENCOUNTER — Emergency Department (HOSPITAL_COMMUNITY): Payer: Medicaid Other

## 2022-05-13 ENCOUNTER — Other Ambulatory Visit: Payer: Self-pay

## 2022-05-13 ENCOUNTER — Inpatient Hospital Stay (HOSPITAL_COMMUNITY)
Admission: EM | Admit: 2022-05-13 | Discharge: 2022-05-15 | DRG: 811 | Disposition: A | Discharge status: Home / Self Care | Payer: Medicaid Other | Attending: Family Medicine | Admitting: Family Medicine

## 2022-05-13 DIAGNOSIS — J4520 Mild intermittent asthma, uncomplicated: Secondary | ICD-10-CM | POA: Diagnosis present

## 2022-05-13 DIAGNOSIS — Z833 Family history of diabetes mellitus: Secondary | ICD-10-CM

## 2022-05-13 DIAGNOSIS — Z6829 Body mass index (BMI) 29.0-29.9, adult: Secondary | ICD-10-CM

## 2022-05-13 DIAGNOSIS — Z87891 Personal history of nicotine dependence: Secondary | ICD-10-CM

## 2022-05-13 DIAGNOSIS — E663 Overweight: Secondary | ICD-10-CM | POA: Diagnosis present

## 2022-05-13 DIAGNOSIS — R1084 Generalized abdominal pain: Principal | ICD-10-CM

## 2022-05-13 DIAGNOSIS — J984 Other disorders of lung: Secondary | ICD-10-CM | POA: Diagnosis present

## 2022-05-13 DIAGNOSIS — R112 Nausea with vomiting, unspecified: Secondary | ICD-10-CM | POA: Diagnosis present

## 2022-05-13 DIAGNOSIS — D509 Iron deficiency anemia, unspecified: Secondary | ICD-10-CM | POA: Diagnosis present

## 2022-05-13 DIAGNOSIS — K219 Gastro-esophageal reflux disease without esophagitis: Secondary | ICD-10-CM | POA: Diagnosis present

## 2022-05-13 DIAGNOSIS — G5600 Carpal tunnel syndrome, unspecified upper limb: Secondary | ICD-10-CM | POA: Insufficient documentation

## 2022-05-13 DIAGNOSIS — E876 Hypokalemia: Secondary | ICD-10-CM | POA: Diagnosis present

## 2022-05-13 DIAGNOSIS — Z720 Tobacco use: Secondary | ICD-10-CM | POA: Diagnosis present

## 2022-05-13 DIAGNOSIS — J440 Chronic obstructive pulmonary disease with acute lower respiratory infection: Secondary | ICD-10-CM | POA: Diagnosis present

## 2022-05-13 DIAGNOSIS — J029 Acute pharyngitis, unspecified: Secondary | ICD-10-CM | POA: Insufficient documentation

## 2022-05-13 DIAGNOSIS — G894 Chronic pain syndrome: Secondary | ICD-10-CM | POA: Diagnosis present

## 2022-05-13 DIAGNOSIS — Z823 Family history of stroke: Secondary | ICD-10-CM

## 2022-05-13 DIAGNOSIS — J189 Pneumonia, unspecified organism: Secondary | ICD-10-CM | POA: Diagnosis not present

## 2022-05-13 DIAGNOSIS — Z888 Allergy status to other drugs, medicaments and biological substances status: Secondary | ICD-10-CM

## 2022-05-13 DIAGNOSIS — Z8249 Family history of ischemic heart disease and other diseases of the circulatory system: Secondary | ICD-10-CM | POA: Diagnosis not present

## 2022-05-13 DIAGNOSIS — D589 Hereditary hemolytic anemia, unspecified: Secondary | ICD-10-CM | POA: Diagnosis present

## 2022-05-13 DIAGNOSIS — E669 Obesity, unspecified: Secondary | ICD-10-CM | POA: Insufficient documentation

## 2022-05-13 DIAGNOSIS — Y95 Nosocomial condition: Secondary | ICD-10-CM | POA: Diagnosis present

## 2022-05-13 DIAGNOSIS — D649 Anemia, unspecified: Secondary | ICD-10-CM

## 2022-05-13 DIAGNOSIS — S40029A Contusion of unspecified upper arm, initial encounter: Secondary | ICD-10-CM | POA: Insufficient documentation

## 2022-05-13 DIAGNOSIS — Z7984 Long term (current) use of oral hypoglycemic drugs: Secondary | ICD-10-CM | POA: Diagnosis not present

## 2022-05-13 DIAGNOSIS — N644 Mastodynia: Secondary | ICD-10-CM | POA: Insufficient documentation

## 2022-05-13 DIAGNOSIS — E66811 Obesity, class 1: Secondary | ICD-10-CM | POA: Insufficient documentation

## 2022-05-13 DIAGNOSIS — E1142 Type 2 diabetes mellitus with diabetic polyneuropathy: Secondary | ICD-10-CM | POA: Diagnosis present

## 2022-05-13 DIAGNOSIS — Z79899 Other long term (current) drug therapy: Secondary | ICD-10-CM | POA: Diagnosis not present

## 2022-05-13 DIAGNOSIS — I5189 Other ill-defined heart diseases: Secondary | ICD-10-CM

## 2022-05-13 DIAGNOSIS — M48061 Spinal stenosis, lumbar region without neurogenic claudication: Secondary | ICD-10-CM | POA: Diagnosis present

## 2022-05-13 DIAGNOSIS — E1165 Type 2 diabetes mellitus with hyperglycemia: Secondary | ICD-10-CM | POA: Diagnosis present

## 2022-05-13 DIAGNOSIS — Z9889 Other specified postprocedural states: Secondary | ICD-10-CM

## 2022-05-13 DIAGNOSIS — F1290 Cannabis use, unspecified, uncomplicated: Secondary | ICD-10-CM | POA: Diagnosis present

## 2022-05-13 LAB — RAPID URINE DRUG SCREEN, HOSP PERFORMED
Amphetamines: NOT DETECTED
Barbiturates: NOT DETECTED
Benzodiazepines: NOT DETECTED
Cocaine: NOT DETECTED
Opiates: POSITIVE — AB
Tetrahydrocannabinol: NOT DETECTED

## 2022-05-13 LAB — URINALYSIS, ROUTINE W REFLEX MICROSCOPIC
Bilirubin Urine: NEGATIVE
Glucose, UA: NEGATIVE mg/dL
Ketones, ur: NEGATIVE mg/dL
Leukocytes,Ua: NEGATIVE
Nitrite: NEGATIVE
Protein, ur: 100 mg/dL — AB
RBC / HPF: >50 RBC/hpf (ref 0–5)
Specific Gravity, Urine: 1.019 (ref 1.005–1.030)
pH: 6 (ref 5.0–8.0)

## 2022-05-13 LAB — PHOSPHORUS: Phosphorus: 2.9 mg/dL (ref 2.5–4.6)

## 2022-05-13 LAB — COMPREHENSIVE METABOLIC PANEL
ALT: 26 U/L (ref 0–44)
AST: 22 U/L (ref 15–41)
Albumin: 3.2 g/dL — ABNORMAL LOW (ref 3.5–5.0)
Alkaline Phosphatase: 114 U/L (ref 38–126)
Anion gap: 8 (ref 5–15)
BUN: <5 mg/dL — ABNORMAL LOW (ref 6–20)
CO2: 21 mmol/L — ABNORMAL LOW (ref 22–32)
Calcium: 8.1 mg/dL — ABNORMAL LOW (ref 8.9–10.3)
Chloride: 106 mmol/L (ref 98–111)
Creatinine, Ser: 0.58 mg/dL (ref 0.44–1.00)
GFR, Estimated: >60 mL/min (ref >60)
Glucose, Bld: 223 mg/dL — ABNORMAL HIGH (ref 70–99)
Potassium: 3.1 mmol/L — ABNORMAL LOW (ref 3.5–5.1)
Sodium: 135 mmol/L (ref 135–145)
Total Bilirubin: 1.8 mg/dL — ABNORMAL HIGH (ref 0.3–1.2)
Total Protein: 6.9 g/dL (ref 6.5–8.1)

## 2022-05-13 LAB — CBC
HCT: 23.6 % — ABNORMAL LOW (ref 36.0–46.0)
Hemoglobin: 7.8 g/dL — ABNORMAL LOW (ref 12.0–15.0)
MCH: 27.9 pg (ref 26.0–34.0)
MCHC: 33.1 g/dL (ref 30.0–36.0)
MCV: 84.3 fL (ref 80.0–100.0)
Platelets: 232 10*3/uL (ref 150–400)
RBC: 2.8 MIL/uL — ABNORMAL LOW (ref 3.87–5.11)
RDW: 22.5 % — ABNORMAL HIGH (ref 11.5–15.5)
WBC: 8.4 10*3/uL (ref 4.0–10.5)
nRBC: 0.6 % — ABNORMAL HIGH (ref 0.0–0.2)

## 2022-05-13 LAB — LIPASE, BLOOD: Lipase: 32 U/L (ref 11–51)

## 2022-05-13 LAB — GLUCOSE, CAPILLARY
Glucose-Capillary: 227 mg/dL — ABNORMAL HIGH (ref 70–99)
Glucose-Capillary: 342 mg/dL — ABNORMAL HIGH (ref 70–99)

## 2022-05-13 LAB — EXPECTORATED SPUTUM ASSESSMENT W GRAM STAIN, RFLX TO RESP C

## 2022-05-13 LAB — BRAIN NATRIURETIC PEPTIDE: B Natriuretic Peptide: 166.1 pg/mL — ABNORMAL HIGH (ref 0.0–100.0)

## 2022-05-13 LAB — MAGNESIUM: Magnesium: 1.8 mg/dL (ref 1.7–2.4)

## 2022-05-13 LAB — STREP PNEUMONIAE URINARY ANTIGEN: Strep Pneumo Urinary Antigen: NEGATIVE

## 2022-05-13 LAB — PROCALCITONIN: Procalcitonin: <0.1 ng/mL

## 2022-05-13 LAB — HCG, QUANTITATIVE, PREGNANCY: hCG, Beta Chain, Quant, S: <1 m[IU]/mL (ref <5)

## 2022-05-13 LAB — MRSA NEXT GEN BY PCR, NASAL: MRSA by PCR Next Gen: NOT DETECTED

## 2022-05-13 MED ORDER — POTASSIUM CHLORIDE CRYS ER 20 MEQ PO TBCR
40.0000 meq | EXTENDED_RELEASE_TABLET | Freq: Once | ORAL | Status: AC
  Administered 2022-05-13: 40 meq via ORAL
  Filled 2022-05-13: qty 2

## 2022-05-13 MED ORDER — PANTOPRAZOLE SODIUM 40 MG IV SOLR
40.0000 mg | INTRAVENOUS | Status: DC
  Administered 2022-05-13: 40 mg via INTRAVENOUS
  Filled 2022-05-13: qty 10

## 2022-05-13 MED ORDER — VANCOMYCIN HCL 1500 MG/300ML IV SOLN: 1500.0000 mg | Freq: Two times a day (BID) | INTRAVENOUS | Status: DC

## 2022-05-13 MED ORDER — IPRATROPIUM-ALBUTEROL 0.5-2.5 (3) MG/3ML IN SOLN
3.0000 mL | Freq: Three times a day (TID) | RESPIRATORY_TRACT | Status: DC
  Administered 2022-05-13 – 2022-05-14 (×2): 3 mL via RESPIRATORY_TRACT
  Filled 2022-05-13 (×2): qty 3

## 2022-05-13 MED ORDER — ONDANSETRON HCL 4 MG/2ML IJ SOLN: 4.0000 mg | Freq: Four times a day (QID) | INTRAMUSCULAR | Status: DC | PRN

## 2022-05-13 MED ORDER — METOCLOPRAMIDE HCL 5 MG/ML IJ SOLN
10.0000 mg | Freq: Four times a day (QID) | INTRAMUSCULAR | Status: DC
  Administered 2022-05-13 – 2022-05-15 (×9): 10 mg via INTRAVENOUS
  Filled 2022-05-13 (×9): qty 2

## 2022-05-13 MED ORDER — SODIUM CHLORIDE 0.9 % IV SOLN
2.0000 g | Freq: Once | INTRAVENOUS | Status: DC
  Filled 2022-05-13: qty 20

## 2022-05-13 MED ORDER — ONDANSETRON 4 MG PO TBDP
4.0000 mg | ORAL_TABLET | Freq: Once | ORAL | Status: AC | PRN
  Administered 2022-05-13: 4 mg via ORAL
  Filled 2022-05-13: qty 1

## 2022-05-13 MED ORDER — ONDANSETRON HCL 4 MG PO TABS: 4.0000 mg | ORAL_TABLET | Freq: Four times a day (QID) | ORAL | Status: DC | PRN

## 2022-05-13 MED ORDER — SODIUM CHLORIDE 0.9 % IV SOLN
2.0000 g | Freq: Once | INTRAVENOUS | Status: AC
  Administered 2022-05-13: 2 g via INTRAVENOUS
  Filled 2022-05-13: qty 12.5

## 2022-05-13 MED ORDER — SODIUM CHLORIDE 0.9 % IV BOLUS
1000.0000 mL | Freq: Once | INTRAVENOUS | Status: AC
  Administered 2022-05-13: 1000 mL via INTRAVENOUS

## 2022-05-13 MED ORDER — GABAPENTIN 400 MG PO CAPS
800.0000 mg | ORAL_CAPSULE | Freq: Three times a day (TID) | ORAL | Status: DC
  Administered 2022-05-13 – 2022-05-15 (×6): 800 mg via ORAL
  Filled 2022-05-13 (×6): qty 2

## 2022-05-13 MED ORDER — MORPHINE SULFATE (PF) 4 MG/ML IV SOLN
4.0000 mg | Freq: Once | INTRAVENOUS | Status: AC
  Administered 2022-05-13: 4 mg via INTRAVENOUS
  Filled 2022-05-13: qty 1

## 2022-05-13 MED ORDER — GUAIFENESIN-DM 100-10 MG/5ML PO SYRP
15.0000 mL | ORAL_SOLUTION | ORAL | Status: DC | PRN
  Administered 2022-05-13 – 2022-05-14 (×4): 15 mL via ORAL
  Filled 2022-05-13 (×5): qty 20

## 2022-05-13 MED ORDER — CYCLOBENZAPRINE HCL 10 MG PO TABS
5.0000 mg | ORAL_TABLET | Freq: Once | ORAL | Status: AC
  Administered 2022-05-13: 5 mg via ORAL
  Filled 2022-05-13: qty 1

## 2022-05-13 MED ORDER — METRONIDAZOLE 500 MG/100ML IV SOLN
500.0000 mg | Freq: Two times a day (BID) | INTRAVENOUS | Status: DC
  Administered 2022-05-13 (×2): 500 mg via INTRAVENOUS
  Filled 2022-05-13 (×2): qty 100

## 2022-05-13 MED ORDER — OXYCODONE-ACETAMINOPHEN 5-325 MG PO TABS
1.0000 | ORAL_TABLET | Freq: Four times a day (QID) | ORAL | Status: DC | PRN
  Administered 2022-05-13 – 2022-05-15 (×4): 1 via ORAL
  Filled 2022-05-13 (×4): qty 1

## 2022-05-13 MED ORDER — ALBUTEROL SULFATE (2.5 MG/3ML) 0.083% IN NEBU: 2.5000 mg | INHALATION_SOLUTION | RESPIRATORY_TRACT | Status: DC | PRN

## 2022-05-13 MED ORDER — ONDANSETRON HCL 4 MG/2ML IJ SOLN
4.0000 mg | Freq: Once | INTRAMUSCULAR | Status: AC
  Administered 2022-05-13: 4 mg via INTRAVENOUS
  Filled 2022-05-13: qty 2

## 2022-05-13 MED ORDER — EMPAGLIFLOZIN 10 MG PO TABS
10.0000 mg | ORAL_TABLET | Freq: Every day | ORAL | Status: DC
  Administered 2022-05-14 – 2022-05-15 (×2): 10 mg via ORAL
  Filled 2022-05-13 (×2): qty 1

## 2022-05-13 MED ORDER — PANTOPRAZOLE SODIUM 40 MG PO TBEC
40.0000 mg | DELAYED_RELEASE_TABLET | Freq: Every day | ORAL | Status: DC
  Administered 2022-05-14 – 2022-05-15 (×2): 40 mg via ORAL
  Filled 2022-05-13 (×2): qty 1

## 2022-05-13 MED ORDER — GLIPIZIDE 5 MG PO TABS
10.0000 mg | ORAL_TABLET | Freq: Two times a day (BID) | ORAL | Status: DC
  Administered 2022-05-14 – 2022-05-15 (×3): 10 mg via ORAL
  Filled 2022-05-13 (×3): qty 2

## 2022-05-13 MED ORDER — VITAMIN B-12 1000 MCG PO TABS
1000.0000 ug | ORAL_TABLET | Freq: Every day | ORAL | Status: DC
  Administered 2022-05-14 – 2022-05-15 (×2): 1000 ug via ORAL
  Filled 2022-05-13 (×2): qty 1

## 2022-05-13 MED ORDER — INSULIN ASPART 100 UNIT/ML IJ SOLN
0.0000 [IU] | Freq: Three times a day (TID) | INTRAMUSCULAR | Status: DC
  Administered 2022-05-13: 3 [IU] via SUBCUTANEOUS

## 2022-05-13 MED ORDER — MAGNESIUM SULFATE 2 GM/50ML IV SOLN
2.0000 g | Freq: Once | INTRAVENOUS | Status: AC
  Administered 2022-05-13: 2 g via INTRAVENOUS
  Filled 2022-05-13: qty 50

## 2022-05-13 MED ORDER — SODIUM CHLORIDE 0.9 % IV SOLN: 500.0000 mg | INTRAVENOUS | Status: DC

## 2022-05-13 MED ORDER — ACETAMINOPHEN 325 MG PO TABS
650.0000 mg | ORAL_TABLET | Freq: Four times a day (QID) | ORAL | Status: DC | PRN
  Administered 2022-05-13 (×2): 650 mg via ORAL
  Filled 2022-05-13 (×2): qty 2

## 2022-05-13 MED ORDER — IOHEXOL 300 MG/ML  SOLN
100.0000 mL | Freq: Once | INTRAMUSCULAR | Status: AC | PRN
  Administered 2022-05-13: 100 mL via INTRAVENOUS

## 2022-05-13 MED ORDER — ACETAMINOPHEN 650 MG RE SUPP: 650.0000 mg | Freq: Four times a day (QID) | RECTAL | Status: DC | PRN

## 2022-05-13 MED ORDER — POTASSIUM CHLORIDE IN NACL 20-0.45 MEQ/L-% IV SOLN
INTRAVENOUS | Status: DC
  Filled 2022-05-13 (×2): qty 1000

## 2022-05-13 MED ORDER — SODIUM CHLORIDE 0.9 % IV SOLN
2.0000 g | Freq: Three times a day (TID) | INTRAVENOUS | Status: DC
  Administered 2022-05-13 – 2022-05-14 (×3): 2 g via INTRAVENOUS
  Filled 2022-05-13 (×3): qty 12.5

## 2022-05-13 MED ORDER — VANCOMYCIN HCL 1750 MG/350ML IV SOLN
1750.0000 mg | Freq: Once | INTRAVENOUS | Status: AC
  Administered 2022-05-13: 1750 mg via INTRAVENOUS
  Filled 2022-05-13: qty 350

## 2022-05-13 MED ORDER — IPRATROPIUM-ALBUTEROL 0.5-2.5 (3) MG/3ML IN SOLN
3.0000 mL | Freq: Four times a day (QID) | RESPIRATORY_TRACT | Status: DC
  Administered 2022-05-13: 3 mL via RESPIRATORY_TRACT
  Filled 2022-05-13: qty 3

## 2022-05-13 NOTE — H&P (Signed)
History and Physical    Patient: Melinda Stone [id number] DOB: 1980/08/02 DOA: 05/13/2022 DOS: the patient was seen and examined on 05/13/2022 PCP: Lauretta Grill, NP  Patient coming from: Home  Chief Complaint:  Chief Complaint  Patient presents with   Abdominal Pain   Emesis   HPI: Melinda Stone is a 42 y.o. female with medical history significant of iron deficiency anemia, osteoarthritis, lumbar foraminal stenosis, status post lumbar microdiscectomy, chronic pain syndrome due to chronic back pain, type 2 diabetes, GERD, diabetic polyneuropathy, GERD, migraine headaches, ovarian cyst, mild intermittent asthma, pulmonary nodules, tobacco abuse, history of UTI who was recently admitted and discharged a week ago due to aspiration pneumonia now returning to the emergency department with abdominal pain since Thursday, nausea and 7 episodes of emesis since Friday.  She has had several episodes of diarrhea in the last few days as well..  She denied hematemesis or coffee-ground emesis.  No constipation, melena or hematochezia.  No flank pain, dysuria, frequency or hematuria.She has had some rhinorrhea and occasional wheezing, but denied fever, chills, sore throat or hemoptysis.  No chest pain, palpitations, diaphoresis, PND, orthopnea or pitting edema of the lower extremities.    No polyuria, polydipsia, polyphagia or blurred vision.   ED course: Initial vital signs were temperature 98.7 F, pulse 89, respiration 18, BP 155/85 mmHg O2 sat 100% on room air.  The patient received 1000 mL normal saline bolus, morphine 4 mg IVP, ondansetron 4 mg IVP, ondansetron 4 mg disintegrating tablet, KCl 40 mEq p.o. x 1, cyclobenzaprine 5 mg p.o. x 1, cefepime 2 g IVPB, metronidazole 500 mg IVPB and vancomycin 1750 mg IVPB.  Lab work: Urinalysis with large hemoglobinuria and proteinuria 100 mg/dL.  3 more than 50 RBC and rare bacteria microscopic examination.  hCG pregnancy test was negative.  CBC  is her white count of 8.4, hemoglobin 7.8 g/dL platelets 232.  Lipase is normal.  CMP showed a potassium of 3.1 and CO2 of 21 mmol/L with a normal anion gap.  Renal function and the rest of the electrolytes are unremarkable after calcium correction.  Albumin 3.2 g/dL, glucose 223 and total bilirubin 1.8 mg/dL. Total protein, transaminases and alkaline phosphatase were normal.  Imaging: 2 view chest radiograph with new dense perihilar infiltrates on the left greater than right.  CHF or fluid overload versus noncardiogenic edema or pneumonia with interstitial component to be considered.  There are increased subpleural interstitial markings throughout, with a basal gradient.  Minimal pleural effusions.  CT abdomen/pelvis with contrast showed increased in chronic small bilateral layering pleural effusions with widespread but nonspecific bilateral peribronchial and subpleural groundglass lung opacity in all visible lobes.  Less pronounced left lower lobe groundglass opacity that was present last year.  Questionable viral/atypical pneumonia.  Should also consider chronic noninfectious lung disease like pulmonary vasculitis or hypersensitivity pneumonitis.  There are superimposed bilateral pararenal space inflammation, but no evidence of pyelonephritis or obstructive uropathy.  Questionable intrinsic renal disease since there is suspicious of lung vasculitis.  There is chronic hepatosplenomegaly of unclear etiology.  No other acute inflammatory process identified in the or pelvis.   Review of Systems: As mentioned in the history of present illness. All other systems reviewed and are negative. Past Medical History:  Diagnosis Date   Anemia    Arthritis 08/17/2020   Chronic back pain    Diabetes mellitus without complication (HCC)    type 2   GERD (gastroesophageal reflux disease)    Migraine  Ovarian cyst    Pneumonia    Polyneuropathy    Past Surgical History:  Procedure Laterality Date   CESAREAN  SECTION     CHOLECYSTECTOMY  2004   LUMBAR LAMINECTOMY N/A 05/30/2020   Procedure: Lumbar five Laminectomy,  Bilateral Microdiscectomy, Left Lumbar five -Sacral one Microdiscectomy;  Surgeon: Marybelle Killings, MD;  Location: [facility];  Service: Orthopedics;  Laterality: N/A;   LUMBAR LAMINECTOMY Left 11/14/2020   Procedure: LEFT LUMBAR FOUR-FIVE MICRODISCECTOMY;  Surgeon: Marybelle Killings, MD;  Location: [facility];  Service: Orthopedics;  Laterality: Left;   TUBAL LIGATION  10/19/2010   Social History:  reports that she quit smoking about 4 months ago. Her smoking use included cigars. She has never used smokeless tobacco. She reports that she does not currently use alcohol. She reports that she does not currently use drugs.  Allergies  Allergen Reactions   Trulicity [Dulaglutide] Diarrhea    Family History  Problem Relation Age of Onset   Diabetes Mother    Hypertension Mother    Stroke Mother     Prior to Admission medications   Medication Sig Start Date End Date Taking? Authorizing Provider  amitriptyline (ELAVIL) 25 MG tablet TAKE 1 TABLET(25 MG) BY MOUTH AT BEDTIME Patient taking differently: Take 25 mg by mouth at bedtime. 05/01/22   Penumalli, Earlean Polka, MD  amoxicillin-clavulanate (AUGMENTIN) 875-125 MG tablet Take 1 tablet by mouth every 12 hours for 6 days. 05/07/22 05/13/22  Nita Sells, MD  cyanocobalamin (VITAMIN B12) 1000 MCG tablet Take 1 tablet (1,000 mcg total) by mouth daily. 04/10/22   Earlie Server, MD  dicyclomine (BENTYL) 20 MG tablet Take 1 tablet (20 mg total) by mouth 2 (two) times daily. Patient not taking: Reported on 05/05/2022 10/31/21   Evlyn Courier, PA-C  empagliflozin (JARDIANCE) 10 MG TABS tablet Take 1 tablet (10 mg total) by mouth daily before breakfast. 04/16/22   Shamleffer, Melanie Crazier, MD  gabapentin (NEURONTIN) 800 MG tablet Take 800 mg by mouth in the morning, at noon, in the evening, and at bedtime. 04/04/21   [provider]  glipiZIDE (GLUCOTROL) 10  MG tablet Take 2 tablets (20 mg total) by mouth daily before breakfast AND 1 tablet (10 mg total) daily before supper. 10/11/21   Shamleffer, Melanie Crazier, MD  guaiFENesin-dextromethorphan (ROBITUSSIN DM) 100-10 MG/5ML syrup Take 5 mLs by mouth every 4 (four) hours as needed for cough. 05/07/22   Nita Sells, MD  ondansetron (ZOFRAN-ODT) 4 MG disintegrating tablet Take 1 tablet (4 mg total) by mouth every 8 (eight) hours as needed for nausea or vomiting. 02/25/22   Sponseller, Eugene Garnet R, PA-C  oxyCODONE-acetaminophen (PERCOCET/ROXICET) 5-325 MG tablet Take 1 tablet by mouth every 6 (six) hours as needed for moderate pain or severe pain. 03/13/21   [provider]  pantoprazole (PROTONIX) 40 MG tablet Take 1 tablet (40 mg total) by mouth daily. 06/12/21   Georgette Shell, MD  prednisoLONE acetate (PRED FORTE) 1 % ophthalmic suspension Place 1 drop into the left eye 4 (four) times daily. 04/26/22   [provider]  tiZANidine (ZANAFLEX) 4 MG tablet Take 4 mg by mouth 2 (two) times daily. 04/26/22   [provider]  vitamin C (ASCORBIC ACID) 250 MG tablet Take 2 tablets (500 mg total) by mouth daily. 04/10/22   Earlie Server, MD    Physical Exam: Vitals:   05/13/22 0358 05/13/22 0600 05/13/22 0636 05/13/22 0754  BP: (!) 158/92 (!) 144/84    Pulse: 88  89 91   Resp: 16 16    Temp: 97.9 F (36.6 C)   98.3 F (36.8 C)  TempSrc: Oral   Oral  SpO2: 95% 91% 94%   Weight:      Height:       Physical Exam Vitals and nursing note reviewed.  Constitutional:      General: She is awake. She is not in acute distress.    Appearance: She is obese. She is ill-appearing.  HENT:     Head: Normocephalic.     Nose: No rhinorrhea.     Mouth/Throat:     Mouth: Mucous membranes are dry.  Eyes:     General: No scleral icterus.    Pupils: Pupils are equal, round, and reactive to light.  Neck:     Vascular: No JVD.  Cardiovascular:     Rate and Rhythm: Normal rate and regular  rhythm.     Heart sounds: S1 normal and S2 normal.  Pulmonary:     Effort: Pulmonary effort is normal. No tachypnea, accessory muscle usage or respiratory distress.     Breath sounds: Examination of the right-middle field reveals rales. Examination of the left-middle field reveals rales. Wheezing, rhonchi and rales present.  Abdominal:     General: Abdomen is protuberant. Bowel sounds are normal.     Palpations: Abdomen is soft.     Tenderness: There is generalized abdominal tenderness. There is no right CVA tenderness, left CVA tenderness, guarding or rebound.  Musculoskeletal:     Cervical back: Neck supple.     Right lower leg: No edema.     Left lower leg: No edema.  Skin:    General: Skin is warm and dry.  Neurological:     General: No focal deficit present.     Mental Status: She is alert and oriented to person, place, and time.  Psychiatric:        Mood and Affect: Mood normal.        Behavior: Behavior normal. Behavior is cooperative.     Data Reviewed:  Results are pending, will review when available.  Assessment and Plan: Principal Problem:   HCAP (healthcare-associated pneumonia) Superimposed on:   Mild intermittent asthma Possible superimposed aspiration due to multiple episodes of   Nausea & vomiting Likely inducing aspiration. Admit to telemetry/inpatient. Continue IV fluids. Albuterol nebs as needed. DuoNebs 4 times a day. Metoclopramide 10 mg IVP q 6 hr. Antiemetics as needed. Continue cefepime 2 g every 8 hours.   Continue metronidazole 500 mg IVPB q 12 hr. Continue vancomycin per pharmacy. Follow-up blood culture and sensitivity. De-escalate antibiotics as possible. Follow CBC and CMP in a.m.  Active Problems:   Marijuana use   Tobacco use Both in remission now    Type 2 diabetes mellitus with hyperglycemia,  without long-term current use of insulin (HCC) Currently NPO. Advance diet as tolerated. Resume Jardiance and glipizide once on  diet. CBG monitoring every 6 hours. CBG monitoring with RI SS before meals once on diet.    Type 2 diabetes mellitus with diabetic polyneuropathy,    without long-term current use of insulin (HCC) Continue gabapentin and analgesics.    Chronic pain syndrome   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)   S/P lumbar microdiscectomy Once tolerating oral intake: Resume gabapentin, APA/oxycodone and tizanidine. In the meantime, as needed parenteral analgesics.    Esophageal reflux Pantoprazole 40 mg IVP x 1. Resume oral pantoprazole once tolerating oral intake.    IDA (iron deficiency  anemia) Monitor hematocrit and hemoglobin. Transfuse as needed.    Grade I diastolic dysfunction No signs of decompensation.    Overweight (BMI 25.0-29.9) BMI 29.53 kg/m. Lifestyle modifications. Follow-up with primary care provider.    Advance Care Planning:   Code Status: Full Code   Consults:   Family Communication:   Severity of Illness: The appropriate patient status for this patient is INPATIENT. Inpatient status is judged to be reasonable and necessary in order to provide the required intensity of service to ensure the patient's safety. The patient's presenting symptoms, physical exam findings, and initial radiographic and laboratory data in the context of their chronic comorbidities is felt to place them at high risk for further clinical deterioration. Furthermore, it is not anticipated that the patient will be medically stable for discharge from the hospital within 2 midnights of admission.   * I certify that at the point of admission it is my clinical judgment that the patient will require inpatient hospital care spanning beyond 2 midnights from the point of admission due to high intensity of service, high risk for further deterioration and high frequency of surveillance required.*  Author: Reubin Milan, MD 05/13/2022 8:49 AM  For on call review [email or web address].   This document  was prepared using Dragon voice recognition software and may contain some unintended transcription errors.

## 2022-05-13 NOTE — ED Notes (Signed)
Pt's desat maintained 89-91 on RA. Pt placed on 2L O2 [place].

## 2022-05-13 NOTE — Progress Notes (Addendum)
Pharmacy Antibiotic Note  Melinda Stone is a 42 y.o. female admitted on 05/13/2022 with abdominal pain and emesis. Patient with recent admission during which she was treated for aspiration pnemonia with Unasyn then discharged on Augmentin. Pharmacy has been consulted for vancomycin dosing for pneumonia.  Plan: -Vancomycin 1750 mg followed by 1500 mg IV q12h -Cefepime 2 g IV q8h, metronidazole 500 mg IV q12h -MRSA PCR ordered -Continue to follow renal function, cultures and clinical progress for dose adjustments and de-escalation as indicated  Height: '5\' 9"'$  (175.3 cm) Weight: 90.7 kg (200 lb) IBW/kg (Calculated) : 66.2  Temp (24hrs), Avg:98.5 F (36.9 C), Min:97.9 F (36.6 C), Max:99.1 F (37.3 C)  Recent Labs  Lab 05/07/22 0437 05/13/22 0415  WBC 8.9 8.4  CREATININE 0.53 0.58    Estimated Creatinine Clearance: 112.2 mL/min (by C-G formula based on SCr of 0.58 mg/dL).    Allergies  Allergen Reactions   Trulicity [Dulaglutide] Diarrhea    Antimicrobials this admission: Cefepime 2/25 >> Vancomycin 2/25 >> Metronidazole 2/25 >> Cefepime 2/25 x 1  Dose adjustments this admission: NA  Microbiology results: 2/25 BCx: pending 2/25 MRSA PCR: ordered  Thank you for allowing pharmacy to be a part of this patient's care.  Tawnya Crook, PharmD, BCPS Clinical Pharmacist 05/13/2022 11:43 AM

## 2022-05-13 NOTE — Plan of Care (Signed)
  Problem: Activity: Goal: Ability to tolerate increased activity will improve Outcome: Progressing   Problem: Clinical Measurements: Goal: Ability to maintain a body temperature in the normal range will improve Outcome: Progressing   Problem: Respiratory: Goal: Ability to maintain a clear airway will improve Outcome: Progressing   Problem: Education: Goal: Knowledge of General Education information will improve Description: Including pain rating scale, medication(s)/side effects and non-pharmacologic comfort measures Outcome: Progressing   Problem: Health Behavior/Discharge Planning: Goal: Ability to manage health-related needs will improve Outcome: Progressing

## 2022-05-13 NOTE — Progress Notes (Signed)
[facility] admitting physician addendum:  MRSA PCR was negative on this patient.  Vancomycin has been discontinued.  Will continue with cefepime and metronidazole to cover for aspiration.  Tennis Must, MD

## 2022-05-13 NOTE — ED Notes (Signed)
Labeled urine specimen and culture sent to lab. [facility] 

## 2022-05-13 NOTE — ED Triage Notes (Signed)
Pt presents with abdominal pain and 7 episodes of vomiting since yesterday. Also reports being hot but unable to sweat. Recent admission for pneumonia and was doing well until this.

## 2022-05-13 NOTE — Progress Notes (Signed)
A consult was received from an ED physician for vancomycin per pharmacy dosing.  The patient's profile has been reviewed for ht/wt/allergies/indication/available labs.    A one time order has been placed for vancomycin 1750 mg IV.  Further antibiotics/pharmacy consults should be ordered by admitting physician if indicated.                       Thank you, Suzzanne Cloud, PharmD, BCPS 05/13/2022  7:59 AM

## 2022-05-13 NOTE — ED Provider Notes (Signed)
  Physical Exam  BP (!) 144/84   Pulse 91   Temp 98.3 F (36.8 C) (Oral)   Resp 16   Ht 5' 9"$  (1.753 m)   Wt 90.7 kg   LMP 05/07/2022 Comment: Negative pregnancy test  SpO2 94%   BMI 29.53 kg/m   Physical Exam Vitals and nursing note reviewed.  Constitutional:      General: She is not in acute distress.    Appearance: Normal appearance. She is normal weight. She is not ill-appearing.  HENT:     Head: Normocephalic and atraumatic.  Pulmonary:     Effort: Pulmonary effort is normal. No respiratory distress.     Breath sounds: Rales (Bilateral lower lobes, left upper lobe) present.  Abdominal:     General: Abdomen is flat.  Musculoskeletal:        General: Normal range of motion.     Cervical back: Neck supple.  Skin:    General: Skin is warm and dry.  Neurological:     Mental Status: She is alert and oriented to person, place, and time.  Psychiatric:        Mood and Affect: Mood normal.        Behavior: Behavior normal.     Procedures  Procedures  ED Course / MDM    Medical Decision Making Amount and/or Complexity of Data Reviewed Labs: ordered. Radiology: ordered.  Risk Prescription drug management.  Assumed care at shift change.  Please see previous note for full HPI.  In short, 42 year old female presents ED for evaluation of abdominal pain, nausea and vomiting.  Was discharged from hospital for pneumonia 7 days ago.  Has been unable to keep her Augmentin down for the past 3 days.  Has developed a cough productive of yellow sputum since discharge.  CT abdomen pelvis shows irregularities around bilateral kidneys concerning for possible vasculitis.  Chest x-ray shows likely worsening pneumonia.  Was admitted for right upper lobe pneumonia, this has progressed to left lower lobe pneumonia.  Plan to initiate IV antibiotics and admitted for worsening pneumonia.  [id number]-spoke with hospitalist Dr. Olevia Bowens will admit for healthcare associated pneumonia  Note: Portions of  this report may have been transcribed using voice recognition software. Every effort was made to ensure accuracy; however, inadvertent computerized transcription errors may still be present.    Roylene Reason, Hershal Coria 05/13/22 [id number]    Ezequiel Essex, MD 05/13/22 [phone]

## 2022-05-13 NOTE — ED Provider Notes (Signed)
[facility] Provider Note   CSN: [id number] Arrival date & time: 05/13/22  0010     History  Chief Complaint  Patient presents with   Abdominal Pain   Emesis    Melinda Stone is a 42 y.o. female.  HPI   Patient with medical history including diabetes, COPD, aspirin pneumonia presents with complaints of abdominal pain, started on Thursday, states that she has generalized abdominal pain with nausea vomiting, states she has been  unable to tolerate p.o., she states that she still passing gas have normal bowel movements last bowel movement was today, denies bloody stools or dark tarry stools, she denies any hematemesis or coffee-ground emesis.  Abdominal surgeries including cholecystectomy, no other acute abnormalities.  She is understanding urinary symptoms, no associate fever chills general body aches.  He states that she is also cough but has improved since her discharge.  Reviewed patient's chart was recent [facility] for concerns of aspirin pneumonia, started on Unasyn transition to Augmentin, discharged home and was to follow-up with PCP for reassessment.  Home Medications Prior to Admission medications   Medication Sig Start Date End Date Taking? Authorizing Provider  amitriptyline (ELAVIL) 25 MG tablet TAKE 1 TABLET(25 MG) BY MOUTH AT BEDTIME Patient taking differently: Take 25 mg by mouth at bedtime. 05/01/22   Penumalli, Earlean Polka, MD  amoxicillin-clavulanate (AUGMENTIN) 875-125 MG tablet Take 1 tablet by mouth every 12 hours for 6 days. 05/07/22 05/13/22  Nita Sells, MD  cyanocobalamin (VITAMIN B12) 1000 MCG tablet Take 1 tablet (1,000 mcg total) by mouth daily. 04/10/22   Earlie Server, MD  dicyclomine (BENTYL) 20 MG tablet Take 1 tablet (20 mg total) by mouth 2 (two) times daily. Patient not taking: Reported on 05/05/2022 10/31/21   Evlyn Courier, PA-C  empagliflozin (JARDIANCE) 10 MG TABS  tablet Take 1 tablet (10 mg total) by mouth daily before breakfast. 04/16/22   Shamleffer, Melanie Crazier, MD  gabapentin (NEURONTIN) 800 MG tablet Take 800 mg by mouth in the morning, at noon, in the evening, and at bedtime. 04/04/21   [provider]  glipiZIDE (GLUCOTROL) 10 MG tablet Take 2 tablets (20 mg total) by mouth daily before breakfast AND 1 tablet (10 mg total) daily before supper. 10/11/21   Shamleffer, Melanie Crazier, MD  guaiFENesin-dextromethorphan (ROBITUSSIN DM) 100-10 MG/5ML syrup Take 5 mLs by mouth every 4 (four) hours as needed for cough. 05/07/22   Nita Sells, MD  ondansetron (ZOFRAN-ODT) 4 MG disintegrating tablet Take 1 tablet (4 mg total) by mouth every 8 (eight) hours as needed for nausea or vomiting. 02/25/22   Sponseller, Eugene Garnet R, PA-C  oxyCODONE-acetaminophen (PERCOCET/ROXICET) 5-325 MG tablet Take 1 tablet by mouth every 6 (six) hours as needed for moderate pain or severe pain. 03/13/21   [provider]  pantoprazole (PROTONIX) 40 MG tablet Take 1 tablet (40 mg total) by mouth daily. 06/12/21   Georgette Shell, MD  prednisoLONE acetate (PRED FORTE) 1 % ophthalmic suspension Place 1 drop into the left eye 4 (four) times daily. 04/26/22   [provider]  tiZANidine (ZANAFLEX) 4 MG tablet Take 4 mg by mouth 2 (two) times daily. 04/26/22   [provider]  vitamin C (ASCORBIC ACID) 250 MG tablet Take 2 tablets (500 mg total) by mouth daily. 04/10/22   Earlie Server, MD      Allergies    Trulicity [dulaglutide]    Review of Systems  Review of Systems  Constitutional:  Negative for chills and fever.  Respiratory:  Positive for cough. Negative for shortness of breath.   Cardiovascular:  Negative for chest pain.  Gastrointestinal:  Positive for abdominal pain and vomiting. Negative for diarrhea.  Neurological:  Negative for headaches.    Physical Exam Updated Vital Signs BP (!) 144/84   Pulse 91   Temp 97.9 F (36.6 C)  (Oral)   Resp 16   Ht '5\' 9"'$  (1.753 m)   Wt 90.7 kg   LMP 05/07/2022 Comment: Negative pregnancy test  SpO2 94%   BMI 29.53 kg/m  Physical Exam Vitals and nursing note reviewed.  Constitutional:      General: She is not in acute distress.    Appearance: She is not ill-appearing.  HENT:     Head: Normocephalic and atraumatic.     Nose: No congestion.  Eyes:     Conjunctiva/sclera: Conjunctivae normal.  Cardiovascular:     Rate and Rhythm: Normal rate and regular rhythm.     Pulses: Normal pulses.     Heart sounds: No murmur heard.    No friction rub. No gallop.  Pulmonary:     Effort: No respiratory distress.     Breath sounds: No wheezing, rhonchi or rales.     Comments: Patient has noted bilateral Rales present without wheezing or rhonchi present. Abdominal:     Palpations: Abdomen is soft.     Tenderness: There is abdominal tenderness. There is no right CVA tenderness or left CVA tenderness.     Comments: Abdomen nondistended, soft, she has nonfocal has abdominal tenderness there is no guarding rebound tense or peritoneal sign.  Musculoskeletal:     Right lower leg: No edema.     Left lower leg: No edema.  Skin:    General: Skin is warm and dry.  Neurological:     Mental Status: She is alert.  Psychiatric:        Mood and Affect: Mood normal.     ED Results / Procedures / Treatments   Labs (all labs ordered are listed, but only abnormal results are displayed) Labs Reviewed  COMPREHENSIVE METABOLIC PANEL - Abnormal; Notable for the following components:      Result Value   Potassium 3.1 (*)    CO2 21 (*)    Glucose, Bld 223 (*)    BUN <5 (*)    Calcium 8.1 (*)    Albumin 3.2 (*)    Total Bilirubin 1.8 (*)    All other components within normal limits  CBC - Abnormal; Notable for the following components:   RBC 2.80 (*)    Hemoglobin 7.8 (*)    HCT 23.6 (*)    RDW 22.5 (*)    nRBC 0.6 (*)    All other components within normal limits  URINALYSIS, ROUTINE W  REFLEX MICROSCOPIC - Abnormal; Notable for the following components:   Hgb urine dipstick LARGE (*)    Protein, ur 100 (*)    Bacteria, UA RARE (*)    All other components within normal limits  CULTURE, BLOOD (ROUTINE X 2)  CULTURE, BLOOD (ROUTINE X 2)  LIPASE, BLOOD  HCG, QUANTITATIVE, PREGNANCY  MAGNESIUM    EKG None  Radiology CT ABDOMEN PELVIS W CONTRAST  Result Date: 05/13/2022 CLINICAL DATA:  42 year old female with recurrent abdominal pain and vomiting. EXAM: CT ABDOMEN AND PELVIS WITH CONTRAST TECHNIQUE: Multidetector CT imaging of the abdomen and pelvis was performed using the standard protocol following  bolus administration of intravenous contrast. RADIATION DOSE REDUCTION: This exam was performed according to the departmental dose-optimization program which includes automated exposure control, adjustment of the mA and/or kV according to patient size and/or use of iterative reconstruction technique. CONTRAST:  154m OMNIPAQUE IOHEXOL 300 MG/ML  SOLN COMPARISON:  CT Abdomen and Pelvis 06/09/2021 and earlier. FINDINGS: Lower chest: Small bilateral layering pleural effusions, trace pleural fluid last year. Cardiac size remains normal. No pericardial effusion. But there is multifocal Patchy and confluent peribronchial and subpleural ground-glass lung opacity in all visible lobes. Similar ground-glass opacity was limited to the left lower lobe and in a slightly different pattern on the CT Abdomen and Pelvis last year. Hepatobiliary: Chronically absent gallbladder. Hepatomegaly, 26 cm liver length craniocaudal, not significantly changed from last year. Otherwise negative liver. Pancreas: Negative. Spleen: Splenomegaly, estimated splenic volume 974 mL (normal splenic volume range 83 - 412 mL). But this is chronic. No discrete splenic lesion. Adrenals/Urinary Tract: Normal adrenal glands. There seems to be symmetric new pararenal space inflammatory stranding, but the bilateral renal enhancement  and contrast excretion is symmetric and within normal limits. No lobar nephronia. No nephrolithiasis. Renal collecting systems appear stable. Ureters appear decompressed. Bladder is decompressed and unremarkable. Chronic pelvic phleboliths are stable. Stomach/Bowel: No dilated large or small bowel loops. Some retained stool in the colon, although there seems to be fluid in the sigmoid and rectum. No convincing large bowel inflammation. Appendix appears to remain normal on series 2, image 70. Negative terminal ileum. Decompressed stomach and duodenum. No abdominal free air or free fluid. Vascular/Lymphatic: Major arterial structures in the abdomen and pelvis appear patent and normal. Portal venous system is patent. No lymphadenopathy identified. Reproductive: Simple fluid density 4 cm left ovarian cyst. No follow-up imaging is recommended. Reference: JACR 2020 Feb;17(2):248-254 Trace free fluid suspected adjacent to the right ovary. Unremarkable uterus. Other: No layering pelvis free fluid. Musculoskeletal: Advanced chronic L4-L5 lumbar spine degeneration. Chronic postoperative changes to the lumbar spine, midline posterior paraspinal incision appears stable. Possible chronic inferior pubic ramus fractures are stable. No acute osseous abnormality identified. There is nonspecific bilateral flank subcutaneous edema which is new. IMPRESSION: 1. Small bilateral layering pleural effusions are chronic but increased, with widespread but nonspecific bilateral peribronchial and subpleural ground-glass lung opacity in all visible lobes. Less pronounced left lower lobe ground-glass opacity was present last year. Query acute viral/atypical pneumonia. Otherwise consider a chronic non infectious inflammatory lung disease (perhaps pulmonary vasculitis or hypersensitivity pneumonitis). 2. Superimposed bilateral pararenal space inflammation, but no evidence of pyelonephritis or obstructive uropathy. Query acute intrinsic renal  disease (perhaps in association with a vasculitis in #1). 3. Chronic Hepatosplenomegaly of unclear etiology. Query hepatic insufficiency. 4. No other acute or inflammatory process identified in the abdomen or pelvis. Electronically Signed   By: HGenevie AnnM.D.   On: 05/13/2022 05:42    Procedures Procedures    Medications Ordered in ED Medications  cefTRIAXone (ROCEPHIN) 2 g in sodium chloride 0.9 % 100 mL IVPB (has no administration in time range)  ondansetron (ZOFRAN-ODT) disintegrating tablet 4 mg (4 mg Oral Given 05/13/22 0114)  ondansetron (ZOFRAN) injection 4 mg (4 mg Intravenous Given 05/13/22 0505)  sodium chloride 0.9 % bolus 1,000 mL (1,000 mLs Intravenous New Bag/Given 05/13/22 0505)  morphine (PF) 4 MG/ML injection 4 mg (4 mg Intravenous Given 05/13/22 0505)  iohexol (OMNIPAQUE) 300 MG/ML solution 100 mL (100 mLs Intravenous Contrast Given 05/13/22 0514)  potassium chloride SA (KLOR-CON M) CR tablet 40 mEq (40  mEq Oral Given 05/13/22 [id number])  cyclobenzaprine (FLEXERIL) tablet 5 mg (5 mg Oral Given 05/13/22 [id number])    ED Course/ Medical Decision Making/ A&P                             Medical Decision Making Amount and/or Complexity of Data Reviewed Labs: ordered. Radiology: ordered.  Risk Prescription drug management.   This patient presents to the ED for concern of abdominal pain, this involves an extensive number of treatment options, and is a complaint that carries with it a high risk of complications and morbidity.  The differential diagnosis includes bowel obstruction, volvulus, diverticulitis, appendicitis,    Additional history obtained:  Additional history obtained from N/A External records from outside source obtained and reviewed including recent hospitalization   Co morbidities that complicate the patient evaluation  Diabetes  Social Determinants of Health:  N/A    Lab Tests:  I Ordered, and personally interpreted labs.  The pertinent results include: CBC  shows microcytic anemia hemoglobin 7.8, CMP reveals potassium 3.1, CO2 of 21, glucose 223, calcium 8.1, albumin 3.2, total T. bili 1.8, lipase 32, magnesium 1.8, hCG negative, UA shows many red blood cells.   Imaging Studies ordered:  I ordered imaging studies including chest x-ray, CT abdomen pelvis I independently visualized and interpreted imaging which showed CT imaging reveals small bilateral effusions, ground glass opacities, perirenal inflammation.  I agree with the radiologist interpretation   Cardiac Monitoring:  The patient was maintained on a cardiac monitor.  I personally viewed and interpreted the cardiac monitored which showed an underlying rhythm of: N/A   Medicines ordered and prescription drug management:  I ordered medication including antiemetics, fluids, pain medication I have reviewed the patients home medicines and have made adjustments as needed  Critical Interventions:  N/A   Reevaluation:  Presents with abdominal pain, triage obtain basic lab work which I personally reviewed, abdomen is tender to palpation, will further assess with CT imaging.  CT imaging reveals small bilateral effusions, will further assess with chest x-ray.  Patient reassessed resting comfortably states her pain has resolved, will p.o. challenge.  Consultations Obtained:  N/a   Test Considered:  N/a    Rule out  I have low suspicion for liver or gallbladder abnormality as she has no right upper quadrant tenderness, liver enzymes, alk phos, T bili all within normal limits.  Low suspicion for pancreatitis as lipase is within normal limits.  Suspicion for diverticulitis, bowel obstruction, volvulus, kidney stone, pyelo-, AAA close this time CT imaging is needed for these findings.  Suspicion for CHF exacerbation is also low at this time as she is now, over my exam, she does have slight pleural effusion seen on CT scan but I suspect this is secondary due to recent pneumonia.   Suspicion for UTI [place] is also low at this time she is not endorsing any urinary symptoms, no evidence of infection seen on UA.  She does have white blood cells present, unclear etiology she will need to follow-up with urology.     Dispostion and problem list  Due to shift change patient will be handed off to alexander schutt PAC  Admit to medicine for worsening pneumonia, will possibly need evaluation by urology for CT imaging of pararenal inflammation and/or outpatient follow-up.           Final Clinical Impression(s) / ED Diagnoses Final diagnoses:  Community acquired pneumonia of left lung, unspecified part  of lung  Generalized abdominal pain    Rx / DC Orders ED Discharge Orders     None         Aron Baba 05/13/22 [id number]    Quintella Reichert, MD 05/13/22 0800

## 2022-05-14 DIAGNOSIS — J984 Other disorders of lung: Secondary | ICD-10-CM | POA: Diagnosis not present

## 2022-05-14 DIAGNOSIS — D649 Anemia, unspecified: Secondary | ICD-10-CM | POA: Diagnosis not present

## 2022-05-14 DIAGNOSIS — E1165 Type 2 diabetes mellitus with hyperglycemia: Secondary | ICD-10-CM

## 2022-05-14 DIAGNOSIS — R112 Nausea with vomiting, unspecified: Secondary | ICD-10-CM | POA: Diagnosis not present

## 2022-05-14 DIAGNOSIS — G894 Chronic pain syndrome: Secondary | ICD-10-CM

## 2022-05-14 DIAGNOSIS — E876 Hypokalemia: Secondary | ICD-10-CM | POA: Insufficient documentation

## 2022-05-14 LAB — RETICULOCYTES
Immature Retic Fract: 25 % — ABNORMAL HIGH (ref 2.3–15.9)
RBC.: 2.4 MIL/uL — ABNORMAL LOW (ref 3.87–5.11)
Retic Count, Absolute: 349.5 10*3/uL — ABNORMAL HIGH (ref 19.0–186.0)
Retic Ct Pct: 14.6 % — ABNORMAL HIGH (ref 0.4–3.1)

## 2022-05-14 LAB — CBC WITH DIFFERENTIAL/PLATELET
Abs Immature Granulocytes: 0.18 10*3/uL — ABNORMAL HIGH (ref 0.00–0.07)
Basophils Absolute: 0.1 10*3/uL (ref 0.0–0.1)
Basophils Relative: 1 %
Eosinophils Absolute: 0 10*3/uL (ref 0.0–0.5)
Eosinophils Relative: 0 %
HCT: 26.6 % — ABNORMAL LOW (ref 36.0–46.0)
Hemoglobin: 8.5 g/dL — ABNORMAL LOW (ref 12.0–15.0)
Immature Granulocytes: 2 %
Lymphocytes Relative: 18 %
Lymphs Abs: 2 10*3/uL (ref 0.7–4.0)
MCH: 27.9 pg (ref 26.0–34.0)
MCHC: 32 g/dL (ref 30.0–36.0)
MCV: 87.2 fL (ref 80.0–100.0)
Monocytes Absolute: 0.7 10*3/uL (ref 0.1–1.0)
Monocytes Relative: 7 %
Neutro Abs: 8.3 10*3/uL — ABNORMAL HIGH (ref 1.7–7.7)
Neutrophils Relative %: 72 %
Platelets: 227 10*3/uL (ref 150–400)
RBC: 3.05 MIL/uL — ABNORMAL LOW (ref 3.87–5.11)
RDW: 23.6 % — ABNORMAL HIGH (ref 11.5–15.5)
WBC: 11.3 10*3/uL — ABNORMAL HIGH (ref 4.0–10.5)
nRBC: 0.5 % — ABNORMAL HIGH (ref 0.0–0.2)

## 2022-05-14 LAB — COMPREHENSIVE METABOLIC PANEL
ALT: 19 U/L (ref 0–44)
AST: 17 U/L (ref 15–41)
Albumin: 2.8 g/dL — ABNORMAL LOW (ref 3.5–5.0)
Alkaline Phosphatase: 94 U/L (ref 38–126)
Anion gap: 6 (ref 5–15)
BUN: <5 mg/dL — ABNORMAL LOW (ref 6–20)
CO2: 21 mmol/L — ABNORMAL LOW (ref 22–32)
Calcium: 7.9 mg/dL — ABNORMAL LOW (ref 8.9–10.3)
Chloride: 108 mmol/L (ref 98–111)
Creatinine, Ser: 0.56 mg/dL (ref 0.44–1.00)
GFR, Estimated: >60 mL/min (ref >60)
Glucose, Bld: 257 mg/dL — ABNORMAL HIGH (ref 70–99)
Potassium: 3.4 mmol/L — ABNORMAL LOW (ref 3.5–5.1)
Sodium: 135 mmol/L (ref 135–145)
Total Bilirubin: 1.6 mg/dL — ABNORMAL HIGH (ref 0.3–1.2)
Total Protein: 6 g/dL — ABNORMAL LOW (ref 6.5–8.1)

## 2022-05-14 LAB — RESPIRATORY PANEL BY PCR

## 2022-05-14 LAB — LACTATE DEHYDROGENASE: LDH: 268 U/L — ABNORMAL HIGH (ref 98–192)

## 2022-05-14 LAB — CBC
HCT: 20.5 % — ABNORMAL LOW (ref 36.0–46.0)
Hemoglobin: 6.7 g/dL — CL (ref 12.0–15.0)
MCH: 27.8 pg (ref 26.0–34.0)
MCHC: 32.7 g/dL (ref 30.0–36.0)
MCV: 85.1 fL (ref 80.0–100.0)
Platelets: 200 10*3/uL (ref 150–400)
RBC: 2.41 MIL/uL — ABNORMAL LOW (ref 3.87–5.11)
RDW: 22.9 % — ABNORMAL HIGH (ref 11.5–15.5)
WBC: 7.4 10*3/uL (ref 4.0–10.5)
nRBC: 0.7 % — ABNORMAL HIGH (ref 0.0–0.2)

## 2022-05-14 LAB — TECHNOLOGIST SMEAR REVIEW

## 2022-05-14 LAB — OCCULT BLOOD X 1 CARD TO LAB, STOOL: Fecal Occult Bld: NEGATIVE

## 2022-05-14 LAB — IRON AND TIBC
Iron: 143 ug/dL (ref 28–170)
Saturation Ratios: 46 % — ABNORMAL HIGH (ref 10.4–31.8)
TIBC: 309 ug/dL (ref 250–450)
UIBC: 166 ug/dL

## 2022-05-14 LAB — GLUCOSE, CAPILLARY
Glucose-Capillary: 139 mg/dL — ABNORMAL HIGH (ref 70–99)
Glucose-Capillary: 161 mg/dL — ABNORMAL HIGH (ref 70–99)
Glucose-Capillary: 190 mg/dL — ABNORMAL HIGH (ref 70–99)
Glucose-Capillary: 200 mg/dL — ABNORMAL HIGH (ref 70–99)
Glucose-Capillary: 255 mg/dL — ABNORMAL HIGH (ref 70–99)
Glucose-Capillary: 327 mg/dL — ABNORMAL HIGH (ref 70–99)

## 2022-05-14 LAB — PREPARE RBC (CROSSMATCH)

## 2022-05-14 LAB — HEPATIC FUNCTION PANEL
ALT: 19 U/L (ref 0–44)
AST: 16 U/L (ref 15–41)
Albumin: 2.9 g/dL — ABNORMAL LOW (ref 3.5–5.0)
Alkaline Phosphatase: 99 U/L (ref 38–126)
Bilirubin, Direct: 0.4 mg/dL — ABNORMAL HIGH (ref 0.0–0.2)
Indirect Bilirubin: 1.5 mg/dL — ABNORMAL HIGH (ref 0.3–0.9)
Total Bilirubin: 1.9 mg/dL — ABNORMAL HIGH (ref 0.3–1.2)
Total Protein: 6.1 g/dL — ABNORMAL LOW (ref 6.5–8.1)

## 2022-05-14 LAB — FERRITIN: Ferritin: 58 ng/mL (ref 11–307)

## 2022-05-14 LAB — PROCALCITONIN: Procalcitonin: <0.1 ng/mL

## 2022-05-14 LAB — DIRECT ANTIGLOBULIN TEST (NOT AT ARMC)
DAT, IgG: NEGATIVE
DAT, complement: NEGATIVE

## 2022-05-14 MED ORDER — IPRATROPIUM-ALBUTEROL 0.5-2.5 (3) MG/3ML IN SOLN
3.0000 mL | Freq: Two times a day (BID) | RESPIRATORY_TRACT | Status: DC
  Administered 2022-05-14 – 2022-05-15 (×2): 3 mL via RESPIRATORY_TRACT
  Filled 2022-05-14 (×2): qty 3

## 2022-05-14 MED ORDER — SODIUM CHLORIDE 0.9% IV SOLUTION: Freq: Once | INTRAVENOUS | Status: AC

## 2022-05-14 MED ORDER — POTASSIUM CHLORIDE CRYS ER 20 MEQ PO TBCR
40.0000 meq | EXTENDED_RELEASE_TABLET | Freq: Once | ORAL | Status: AC
  Administered 2022-05-14: 40 meq via ORAL
  Filled 2022-05-14: qty 2

## 2022-05-14 MED ORDER — INSULIN ASPART 100 UNIT/ML IJ SOLN: 0.0000 [IU] | Freq: Every day | INTRAMUSCULAR | Status: DC

## 2022-05-14 MED ORDER — INSULIN ASPART 100 UNIT/ML IJ SOLN
0.0000 [IU] | Freq: Three times a day (TID) | INTRAMUSCULAR | Status: DC
  Administered 2022-05-14: 8 [IU] via SUBCUTANEOUS
  Administered 2022-05-14: 2 [IU] via SUBCUTANEOUS
  Administered 2022-05-15: 8 [IU] via SUBCUTANEOUS
  Administered 2022-05-15: 5 [IU] via SUBCUTANEOUS

## 2022-05-14 MED ORDER — TIZANIDINE HCL 4 MG PO TABS
4.0000 mg | ORAL_TABLET | Freq: Three times a day (TID) | ORAL | Status: DC | PRN
  Administered 2022-05-14 (×2): 4 mg via ORAL
  Filled 2022-05-14 (×2): qty 1

## 2022-05-14 NOTE — Assessment & Plan Note (Signed)
Continue pantoprazole. °

## 2022-05-14 NOTE — Progress Notes (Signed)
  Progress Note   Patient: Melinda Stone [id number] DOB: 1980/10/01 DOA: 05/13/2022     1 DOS: the patient was seen and examined on 05/14/2022 at 10:22AM      Brief hospital course: Melinda Stone is a 42 y.o. F with DM, hx pulmonary nodules and mild asthma, radiculopathy s/p laminectomy and discectomy in 2022, anemia, chronic pain syndrome on daily opiates, and recent admission for aspiration pneumonia who presented with several days N/V.   2/25: In the ER, chest imaging showed worsening pneumonia; admitted for recurrent pneumonia 2/26: Pulm consulted     Assessment and Plan: * Pneumonitis Procal negative.   - Check sputum culture - Check RVP - Stop antibiotics and monitor fever curve  Normocytic anemia Hgb down to 6.7 this morning (baseline 9-10 over last year).  Vague history of iron deficiency, no clinical bleeding now.  - Check LDH, haptoglobin, retics, smear - Check iron studies  - Transfuse 1 unit - Trend hgb  Nausea & vomiting This was the initial presenting complaint, but CT abdomen normal, has resolved with symptomatic care.    Hypokalemia - Supplement K  Overweight (BMI 25.0-29.9) BMI 29  Grade I diastolic dysfunction No orthopnea, no dyspnea on exertion.  BNP low.  I do not think the pleural effusions are related to CHF.  Esophageal reflux - Continue pantoprazole  Mild intermittent asthma No wheezing. - Continue albuterol PRN  Chronic pain syndrome - Continue home oxycodone, gabaptentin, tizanidine  Type 2 diabetes mellitus with hyperglycemia, without long-term current use of insulin (HCC) Glucoses elevated - Continue glipizide and Jardiance - Continue aspart, increase scale          Subjective: Nausea has resolved, she has some nasal congestion and persistent cough.  No further fever.     Physical Exam: BP (!) 149/79 (BP Location: Left Arm)   Pulse 91   Temp 98.4 F (36.9 C) (Oral)   Resp 16   Ht 5' 9"$  (1.753 m)    Wt 90.7 kg   LMP 05/07/2022 Comment: Negative pregnancy test  SpO2 100%   BMI 29.53 kg/m   Adult female, lying in bed, interactive and appropriate RRR, no murmurs, no peripheral edema Respiratory rate normal, some expiratory high-pitched wheezing, no rales Abdomen soft without tenderness palpation or guarding, no ascites or distention Attention normal, affect normal, judgment insight appear normal        Data Reviewed: Potassium up to 3.4, creatinine normal Glucose elevated Hemoglobin down to 6.7 this morning Procalcitonin serially negative BNP normal Urinalysis with microscopic hematuria THC negative    Family Communication: None present      Disposition: Status is: Inpatient         Author: Edwin Dada, MD 05/14/2022 3:16 PM  For on call review [email or web address].

## 2022-05-14 NOTE — Assessment & Plan Note (Signed)
Glucoses elevated - Continue glipizide and Jardiance - Continue aspart, increase scale

## 2022-05-14 NOTE — Assessment & Plan Note (Signed)
No orthopnea, no dyspnea on exertion.  BNP low.  I do not think the pleural effusions are related to CHF.

## 2022-05-14 NOTE — Assessment & Plan Note (Signed)
-   Continue home oxycodone, gabaptentin, tizanidine

## 2022-05-14 NOTE — Hospital Course (Addendum)
Mrs. Asad is a 42 y.o. F with DM, hx pulmonary nodules and mild asthma, radiculopathy s/p laminectomy and discectomy in 2022, anemia, chronic pain syndrome on daily opiates, and recent admission for aspiration pneumonia who presented with several days N/V.   2/25: In the ER, chest imaging showed worsening pneumonia; admitted for recurrent pneumonia 2/26: Pulm consulted

## 2022-05-14 NOTE — Assessment & Plan Note (Signed)
Hgb down to 6.7 this morning (baseline 9-10 over last year).  Vague history of iron deficiency, no clinical bleeding now.  - Check LDH, haptoglobin, retics, smear - Check iron studies  - Transfuse 1 unit - Trend hgb

## 2022-05-14 NOTE — Plan of Care (Signed)
  Problem: Activity: Goal: Ability to tolerate increased activity will improve Outcome: Progressing   Problem: Clinical Measurements: Goal: Ability to maintain a body temperature in the normal range will improve Outcome: Progressing   Problem: Respiratory: Goal: Ability to maintain adequate ventilation will improve Outcome: Progressing Goal: Ability to maintain a clear airway will improve Outcome: Progressing   

## 2022-05-14 NOTE — Assessment & Plan Note (Signed)
Resolved with supplementation and starting spironolactone. 

## 2022-05-14 NOTE — Assessment & Plan Note (Signed)
BMI 29

## 2022-05-14 NOTE — Plan of Care (Signed)
  Problem: Activity: Goal: Ability to tolerate increased activity will improve Outcome: Progressing   Problem: Metabolic: Goal: Ability to maintain appropriate glucose levels will improve Outcome: Progressing   Problem: Coping: Goal: Ability to adjust to condition or change in health will improve Outcome: Progressing   Problem: Safety: Goal: Ability to remain free from injury will improve Outcome: Progressing   Problem: Pain Managment: Goal: General experience of comfort will improve Outcome: Progressing

## 2022-05-14 NOTE — Plan of Care (Signed)
  Problem: Activity: Goal: Ability to tolerate increased activity will improve Outcome: Progressing   

## 2022-05-14 NOTE — Assessment & Plan Note (Signed)
Procal negative.   - Check sputum culture - Check RVP - Stop antibiotics and monitor fever curve

## 2022-05-14 NOTE — Assessment & Plan Note (Addendum)
This was the initial presenting complaint, but CT abdomen normal, has resolved with symptomatic care.

## 2022-05-14 NOTE — Assessment & Plan Note (Signed)
No wheezing. - Continue albuterol PRN

## 2022-05-15 LAB — RETICULOCYTES
Immature Retic Fract: 23.9 % — ABNORMAL HIGH (ref 2.3–15.9)
RBC.: 2.92 MIL/uL — ABNORMAL LOW (ref 3.87–5.11)
Retic Count, Absolute: 407 10*3/uL — ABNORMAL HIGH (ref 19.0–186.0)
Retic Ct Pct: 14 % — ABNORMAL HIGH (ref 0.4–3.1)

## 2022-05-15 LAB — CBC
HCT: 25.2 % — ABNORMAL LOW (ref 36.0–46.0)
Hemoglobin: 8.2 g/dL — ABNORMAL LOW (ref 12.0–15.0)
MCH: 28.3 pg (ref 26.0–34.0)
MCHC: 32.5 g/dL (ref 30.0–36.0)
MCV: 86.9 fL (ref 80.0–100.0)
Platelets: 209 10*3/uL (ref 150–400)
RBC: 2.9 MIL/uL — ABNORMAL LOW (ref 3.87–5.11)
RDW: 23.6 % — ABNORMAL HIGH (ref 11.5–15.5)
WBC: 9.5 10*3/uL (ref 4.0–10.5)
nRBC: 0.3 % — ABNORMAL HIGH (ref 0.0–0.2)

## 2022-05-15 LAB — TYPE AND SCREEN
ABO/RH(D): A POS
Antibody Screen: NEGATIVE
Unit division: 0

## 2022-05-15 LAB — BPAM RBC
Blood Product Expiration Date: 202403162359
ISSUE DATE / TIME: 202402260748
Unit Type and Rh: 6200

## 2022-05-15 LAB — COMPREHENSIVE METABOLIC PANEL
ALT: 17 U/L (ref 0–44)
AST: 16 U/L (ref 15–41)
Albumin: 3.1 g/dL — ABNORMAL LOW (ref 3.5–5.0)
Alkaline Phosphatase: 93 U/L (ref 38–126)
Anion gap: 6 (ref 5–15)
BUN: 5 mg/dL — ABNORMAL LOW (ref 6–20)
CO2: 22 mmol/L (ref 22–32)
Calcium: 8.6 mg/dL — ABNORMAL LOW (ref 8.9–10.3)
Chloride: 109 mmol/L (ref 98–111)
Creatinine, Ser: 0.74 mg/dL (ref 0.44–1.00)
GFR, Estimated: >60 mL/min (ref >60)
Glucose, Bld: 283 mg/dL — ABNORMAL HIGH (ref 70–99)
Potassium: 4 mmol/L (ref 3.5–5.1)
Sodium: 137 mmol/L (ref 135–145)
Total Bilirubin: 1.4 mg/dL — ABNORMAL HIGH (ref 0.3–1.2)
Total Protein: 6.2 g/dL — ABNORMAL LOW (ref 6.5–8.1)

## 2022-05-15 LAB — HAPTOGLOBIN: Haptoglobin: <10 mg/dL — ABNORMAL LOW (ref 33–278)

## 2022-05-15 LAB — GLUCOSE, CAPILLARY
Glucose-Capillary: 258 mg/dL — ABNORMAL HIGH (ref 70–99)
Glucose-Capillary: 201 mg/dL — ABNORMAL HIGH (ref 70–99)

## 2022-05-15 MED ORDER — ONDANSETRON 4 MG PO TBDP: 4.0000 mg | ORAL_TABLET | Freq: Three times a day (TID) | ORAL | 2 refills | Status: DC | PRN

## 2022-05-15 MED ORDER — FLUCONAZOLE 150 MG PO TABS: 150.0000 mg | ORAL_TABLET | Freq: Every day | ORAL | 0 refills | Status: DC

## 2022-05-15 NOTE — Progress Notes (Signed)
Initial Nutrition Assessment  INTERVENTION:   -Encouraged PO intakes  -Multivitamin with minerals daily  NUTRITION DIAGNOSIS:   Inadequate oral intake related to nausea, vomiting as evidenced by per patient/family report.  GOAL:   Patient will meet greater than or equal to 90% of their needs  MONITOR:   PO intake, Supplement acceptance, Labs, Weight trends, I & O's  REASON FOR ASSESSMENT:   Malnutrition Screening Tool    ASSESSMENT:   42 y.o. F with DM, hx pulmonary nodules and mild asthma, radiculopathy s/p laminectomy and discectomy in 2022, anemia, chronic pain syndrome on daily opiates, and recent admission for aspiration pneumonia who presented with several days N/V.  Patient in room, sitting in chair. Reports she is doing better today. Appetite is good and she is eating 100% of her meals. Pt was having N/V  PTA for days. Reports she still will consume 2 meals a day when having these symptoms albeit likely not keeping it down. On good days she reports consuming 4 meals a day. H/o IDA and Vitamin B-12 deficiencies.  Offered protein supplements but pt declined.  Per patient, weight fluctuates d/t her symptoms.  Per weight records, pt's weight has been trending upwards since 12/04/21.  Medications: Vitamin B-12, Reglan  Labs reviewed: CBGs: 139-258   NUTRITION - FOCUSED PHYSICAL EXAM:  No depletions noted.  Diet Order:   Diet Order             Diet Carb Modified Fluid consistency: Thin; Room service appropriate? Yes  Diet effective now                   EDUCATION NEEDS:   No education needs have been identified at this time  Skin:  Skin Assessment: Reviewed RN Assessment  Last BM:  2/26 -type 6  Height:   Ht Readings from Last 1 Encounters:  05/13/22 '5\' 9"'$  (1.753 m)    Weight:   Wt Readings from Last 1 Encounters:  05/13/22 90.7 kg    BMI:  Body mass index is 29.53 kg/m.  Estimated Nutritional Needs:   Kcal:  1700-1900  Protein:   80-90g  Fluid:  1.9L/day  Clayton Bibles, MS, RD, LDN Inpatient Clinical Dietitian Contact information available via Am[employer]

## 2022-05-15 NOTE — Inpatient Diabetes Management (Signed)
Inpatient Diabetes Program Recommendations  AACE/ADA: New Consensus Statement on Inpatient Glycemic Control (2015)  Target Ranges:  Prepandial:   less than 140 mg/dL      Peak postprandial:   less than 180 mg/dL (1-2 hours)      Critically ill patients:  140 - 180 mg/dL   Lab Results  Component Value Date   GLUCAP 258 (H) 05/15/2022   HGBA1C 5.6 04/16/2022    Review of Glycemic Control  Diabetes history: DM2 Outpatient Diabetes medications: Jardiance 10 mg QAM, glipizide 20 mg BID Current orders for Inpatient glycemic control: Jardiance 10 mg QD, glipizide 10 mg BID, Novolog 0-15 TID with meals and 0-5 HS  HgbA1C - 5.6% - likely not accurate with low H/H Blood sugars above goal of 140-180 mg/dL  Inpatient Diabetes Program Recommendations:    Consider adding Semglee 10 units QD  Consider discontinuing glipizide while inpatient (per ADA recommendations)  Continue to follow glucose trends.  Thank you. Lorenda Peck, RD, LDN, [facility] Inpatient Diabetes Coordinator [phone]

## 2022-05-15 NOTE — Consult Note (Signed)
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  Chief Complaint  Patient presents with   Abdominal Pain   Emesis    HPI:   This is a very pleasant 42 year old female patient with past medical history significant for diabetes mellitus, radiculopathy status postlaminectomy and discectomy in 2022, chronic pain syndrome, history of iron deficiency anemia followed by Dr. Tasia Catchings at [facility] admitted with chief complaint of nausea and vomiting.  She was recently admitted for atypical pneumonia and was prescribed antibiotics.  She was taking amoxicillin and started having nausea and vomiting.  She also has some baseline diarrhea which according to the patient was not any different at the time of admission.  Labs done during the ER visit showed hemoglobin down to 6.7 with no evidence of clinical bleeding, elevated reticulocyte count, LDH and low haptoglobin suggestive of hemolysis.  She had DAT test which is negative.  Hematology was consulted for further recommendations.  Patient denies any known hematological conditions.  Back in March when she was admitted for urinary tract infection or kidney infection, she does remember having low her hemoglobin and need for transfusion but she did not have these episodes before this.  She does get some IV iron with Dr. Tasia Catchings in [place].  Other than amoxicillin, she denies any new medications.  She says  couple days after Super Bowl is when she started getting sick with flulike symptoms etc.  No known autoimmune diseases.  No known medication allergies to amoxicillin. She feels better and would like to be discharged.  She is willing to follow-up with Dr. Tasia Catchings outpatient.  Rest of the pertinent 10 point ROS reviewed and negative   Past Medical History:  Diagnosis Date   Anemia    Arthritis 08/17/2020   Chronic back pain    Diabetes mellitus  without complication (HCC)    type 2   GERD (gastroesophageal reflux disease)    Migraine    Ovarian cyst    Pneumonia    Polyneuropathy   :   Past Surgical History:  Procedure Laterality Date   CESAREAN SECTION     CHOLECYSTECTOMY  2004   LUMBAR LAMINECTOMY N/A 05/30/2020   Procedure: Lumbar five Laminectomy,  Bilateral Microdiscectomy, Left Lumbar five -Sacral one Microdiscectomy;  Surgeon: Marybelle Killings, MD;  Location: [place];  Service: Orthopedics;  Laterality: N/A;   LUMBAR LAMINECTOMY Left 11/14/2020   Procedure: LEFT LUMBAR FOUR-FIVE MICRODISCECTOMY;  Surgeon: Marybelle Killings, MD;  Location: [place];  Service: Orthopedics;  Laterality: Left;   TUBAL LIGATION  10/19/2010  :   Current Facility-Administered Medications  Medication Dose Route Frequency Provider Last Rate Last Admin   acetaminophen (TYLENOL) tablet 650 mg  650 mg Oral Q6H PRN Reubin Milan, MD   650 mg at 05/13/22 2057   Or   acetaminophen (TYLENOL) suppository 650 mg  650 mg Rectal Q6H PRN Reubin Milan, MD       albuterol (PROVENTIL) (2.5 MG/3ML) 0.083% nebulizer solution 2.5 mg  2.5 mg Nebulization Q4H PRN Reubin Milan, MD       cyanocobalamin (VITAMIN B12) tablet 1,000 mcg  1,000 mcg Oral Daily Reubin Milan, MD   1,000 mcg at 05/15/22 1023   empagliflozin (JARDIANCE) tablet 10 mg  10 mg Oral QAC breakfast Reubin Milan, MD   10 mg at 05/15/22 0827   gabapentin (NEURONTIN) capsule  800 mg  800 mg Oral TID Reubin Milan, MD   800 mg at 05/15/22 1023   glipiZIDE (GLUCOTROL) tablet 10 mg  10 mg Oral BID AC Reubin Milan, MD   10 mg at 05/15/22 [id number]   guaiFENesin-dextromethorphan (ROBITUSSIN DM) 100-10 MG/5ML syrup 15 mL  15 mL Oral Q4H PRN Reubin Milan, MD   15 mL at 05/14/22 2045   insulin aspart (novoLOG) injection 0-15 Units  0-15 Units Subcutaneous TID WC Danford, Suann Larry, MD   5 Units at 05/15/22 1242   insulin aspart (novoLOG) injection 0-5 Units  0-5 Units  Subcutaneous QHS Danford, Suann Larry, MD       ipratropium-albuterol (DUONEB) 0.5-2.5 (3) MG/3ML nebulizer solution 3 mL  3 mL Nebulization BID Edwin Dada, MD   3 mL at 05/15/22 0710   metoCLOPramide (REGLAN) injection 10 mg  10 mg Intravenous Q6H Reubin Milan, MD   10 mg at 05/15/22 1242   ondansetron (ZOFRAN) tablet 4 mg  4 mg Oral Q6H PRN Reubin Milan, MD       Or   ondansetron [facility]) injection 4 mg  4 mg Intravenous Q6H PRN Reubin Milan, MD       oxyCODONE-acetaminophen (PERCOCET/ROXICET) 5-325 MG per tablet 1 tablet  1 tablet Oral Q6H PRN Reubin Milan, MD   1 tablet at 05/15/22 [id number]   pantoprazole (PROTONIX) EC tablet 40 mg  40 mg Oral Daily Reubin Milan, MD   40 mg at 05/15/22 1023   tiZANidine (ZANAFLEX) tablet 4 mg  4 mg Oral Q8H PRN Edwin Dada, MD   4 mg at 05/14/22 2046      Allergies  Allergen Reactions   Trulicity [Dulaglutide] Diarrhea  :   Family History  Problem Relation Age of Onset   Diabetes Mother    Hypertension Mother    Stroke Mother   :   Social History   Socioeconomic History   Marital status: Single    Spouse name: Not on file   Number of children: 3   Years of education: Not on file   Highest education level: High school graduate  Occupational History    Comment: home maker  Tobacco Use   Smoking status: Former    Types: Cigars    Quit date: 12/23/2021    Years since quitting: 0.3   Smokeless tobacco: Never   Tobacco comments:    Not smoking as of 12/23/21. Verified 01/15/22 LW  Vaping Use   Vaping Use: Never used  Substance and Sexual Activity   Alcohol use: Not Currently   Drug use: Not Currently    Comment: per patient stopped smoking marijuana Mar 2022   Sexual activity: Yes    Birth control/protection: None  Other Topics Concern   Not on file  Social History Narrative   Lives with 3 children   Some coffee   Social Determinants of Health   Financial Resource Strain:  Not on file  Food Insecurity: No Food Insecurity (05/13/2022)   Hunger Vital Sign    Worried About Running Out of Food in the Last Year: Never true    Ran Out of Food in the Last Year: Never true  Transportation Needs: No Transportation Needs (05/13/2022)   PRAPARE - Hydrologist (Medical): No    Lack of Transportation (Non-Medical): No  Physical Activity: Not on file  Stress: Not on file  Social Connections: Not on file  Intimate  Partner Violence: Not At Risk (05/13/2022)   Humiliation, Afraid, Rape, and Kick questionnaire    Fear of Current or Ex-Partner: No    Emotionally Abused: No    Physically Abused: No    Sexually Abused: No   Exam: Patient Vitals for the past 24 hrs:  BP Temp Temp src Pulse Resp SpO2  05/15/22 0711 -- -- -- -- -- 99 %  05/15/22 0445 (!) 140/83 98.7 F (37.1 C) Oral 82 20 99 %  05/14/22 2100 (!) 151/82 (!) 97.5 F (36.4 C) Oral 88 18 100 %  05/14/22 2047 -- -- -- -- -- 98 %   Physical Exam Constitutional:      Appearance: She is well-developed.  Eyes:     General: Scleral icterus present.  Cardiovascular:     Rate and Rhythm: Normal rate and regular rhythm.  Pulmonary:     Effort: Pulmonary effort is normal.     Breath sounds: Normal breath sounds.  Abdominal:     General: Abdomen is flat. Bowel sounds are normal.     Palpations: Abdomen is soft.  Skin:    General: Skin is warm.     Coloration: Skin is jaundiced.  Neurological:     General: No focal deficit present.     Mental Status: She is alert.  Psychiatric:        Mood and Affect: Mood normal.       Lab Results  Component Value Date   WBC 9.5 05/15/2022   HGB 8.2 (L) 05/15/2022   HCT 25.2 (L) 05/15/2022   PLT 209 05/15/2022   GLUCOSE 283 (H) 05/15/2022   CHOL 96 (L) 04/28/2020   TRIG 97 04/28/2020   HDL 55 04/28/2020   LDLCALC 23 04/28/2020   ALT 17 05/15/2022   AST 16 05/15/2022   NA 137 05/15/2022   K 4.0 05/15/2022   CL 109 05/15/2022    CREATININE 0.74 05/15/2022   BUN 5 (L) 05/15/2022   CO2 22 05/15/2022    DG Chest 2 View  Result Date: 05/13/2022 CLINICAL DATA:  Coughing and abdominal pain. EXAM: CHEST - 2 VIEW COMPARISON:  PA and lateral 05/05/2022 FINDINGS: The heart is borderline prominent increased slightly in size since the prior study. The mediastinal silhouette is stable. There are increased subpleural interstitial markings throughout, with a basal gradient. Interval new finding of dense perihilar infiltrates on the left-greater-than-right, on the left extending to the periphery of the mid lung. There are minimal pleural effusions also new. Remainder of the peripheral lungs are clear. Findings could be due to CHF or fluid overload, non cardiogenic edema, or pneumonia with an interstitial component. IMPRESSION: 1. Interval new finding of dense perihilar infiltrates on the left-greater-than-right. Findings could be due to CHF or fluid overload, noncardiogenic edema or pneumonia with an interstitial component. 2. Increased subpleural interstitial markings throughout, with a basal gradient. 3. Minimal pleural effusions. Electronically Signed   By: Telford Nab M.D.   On: 05/13/2022 07:27   CT ABDOMEN PELVIS W CONTRAST  Result Date: 05/13/2022 CLINICAL DATA:  42 year old female with recurrent abdominal pain and vomiting. EXAM: CT ABDOMEN AND PELVIS WITH CONTRAST TECHNIQUE: Multidetector CT imaging of the abdomen and pelvis was performed using the standard protocol following bolus administration of intravenous contrast. RADIATION DOSE REDUCTION: This exam was performed according to the departmental dose-optimization program which includes automated exposure control, adjustment of the mA and/or kV according to patient size and/or use of iterative reconstruction technique. CONTRAST:  143m OMNIPAQUE IOHEXOL  300 MG/ML  SOLN COMPARISON:  CT Abdomen and Pelvis 06/09/2021 and earlier. FINDINGS: Lower chest: Small bilateral layering  pleural effusions, trace pleural fluid last year. Cardiac size remains normal. No pericardial effusion. But there is multifocal Patchy and confluent peribronchial and subpleural ground-glass lung opacity in all visible lobes. Similar ground-glass opacity was limited to the left lower lobe and in a slightly different pattern on the CT Abdomen and Pelvis last year. Hepatobiliary: Chronically absent gallbladder. Hepatomegaly, 26 cm liver length craniocaudal, not significantly changed from last year. Otherwise negative liver. Pancreas: Negative. Spleen: Splenomegaly, estimated splenic volume 974 mL (normal splenic volume range 83 - 412 mL). But this is chronic. No discrete splenic lesion. Adrenals/Urinary Tract: Normal adrenal glands. There seems to be symmetric new pararenal space inflammatory stranding, but the bilateral renal enhancement and contrast excretion is symmetric and within normal limits. No lobar nephronia. No nephrolithiasis. Renal collecting systems appear stable. Ureters appear decompressed. Bladder is decompressed and unremarkable. Chronic pelvic phleboliths are stable. Stomach/Bowel: No dilated large or small bowel loops. Some retained stool in the colon, although there seems to be fluid in the sigmoid and rectum. No convincing large bowel inflammation. Appendix appears to remain normal on series 2, image 70. Negative terminal ileum. Decompressed stomach and duodenum. No abdominal free air or free fluid. Vascular/Lymphatic: Major arterial structures in the abdomen and pelvis appear patent and normal. Portal venous system is patent. No lymphadenopathy identified. Reproductive: Simple fluid density 4 cm left ovarian cyst. No follow-up imaging is recommended. Reference: JACR 2020 Feb;17(2):248-254 Trace free fluid suspected adjacent to the right ovary. Unremarkable uterus. Other: No layering pelvis free fluid. Musculoskeletal: Advanced chronic L4-L5 lumbar spine degeneration. Chronic postoperative  changes to the lumbar spine, midline posterior paraspinal incision appears stable. Possible chronic inferior pubic ramus fractures are stable. No acute osseous abnormality identified. There is nonspecific bilateral flank subcutaneous edema which is new. IMPRESSION: 1. Small bilateral layering pleural effusions are chronic but increased, with widespread but nonspecific bilateral peribronchial and subpleural ground-glass lung opacity in all visible lobes. Less pronounced left lower lobe ground-glass opacity was present last year. Query acute viral/atypical pneumonia. Otherwise consider a chronic non infectious inflammatory lung disease (perhaps pulmonary vasculitis or hypersensitivity pneumonitis). 2. Superimposed bilateral pararenal space inflammation, but no evidence of pyelonephritis or obstructive uropathy. Query acute intrinsic renal disease (perhaps in association with a vasculitis in #1). 3. Chronic Hepatosplenomegaly of unclear etiology. Query hepatic insufficiency. 4. No other acute or inflammatory process identified in the abdomen or pelvis. Electronically Signed   By: Genevie Ann M.D.   On: 05/13/2022 05:42   DG Chest 2 View  Result Date: 05/05/2022 CLINICAL DATA:  Chest pain EXAM: CHEST - 2 VIEW COMPARISON:  02/25/2022 FINDINGS: Cardiac shadow is within normal limits. Lungs are well aerated bilaterally. Patchy airspace opacity is noted in the right upper lobe. No effusion is seen. No bony abnormality is noted. IMPRESSION: Early infiltrate in the right upper lobe. Electronically Signed   By: Inez Catalina M.D.   On: 05/05/2022 01:54      DG Chest 2 View  Result Date: 05/13/2022 CLINICAL DATA:  Coughing and abdominal pain. EXAM: CHEST - 2 VIEW COMPARISON:  PA and lateral 05/05/2022 FINDINGS: The heart is borderline prominent increased slightly in size since the prior study. The mediastinal silhouette is stable. There are increased subpleural interstitial markings throughout, with a basal gradient.  Interval new finding of dense perihilar infiltrates on the left-greater-than-right, on the left extending to the periphery of  the mid lung. There are minimal pleural effusions also new. Remainder of the peripheral lungs are clear. Findings could be due to CHF or fluid overload, non cardiogenic edema, or pneumonia with an interstitial component. IMPRESSION: 1. Interval new finding of dense perihilar infiltrates on the left-greater-than-right. Findings could be due to CHF or fluid overload, noncardiogenic edema or pneumonia with an interstitial component. 2. Increased subpleural interstitial markings throughout, with a basal gradient. 3. Minimal pleural effusions. Electronically Signed   By: Telford Nab M.D.   On: 05/13/2022 07:27   CT ABDOMEN PELVIS W CONTRAST  Result Date: 05/13/2022 CLINICAL DATA:  42 year old female with recurrent abdominal pain and vomiting. EXAM: CT ABDOMEN AND PELVIS WITH CONTRAST TECHNIQUE: Multidetector CT imaging of the abdomen and pelvis was performed using the standard protocol following bolus administration of intravenous contrast. RADIATION DOSE REDUCTION: This exam was performed according to the departmental dose-optimization program which includes automated exposure control, adjustment of the mA and/or kV according to patient size and/or use of iterative reconstruction technique. CONTRAST:  123m OMNIPAQUE IOHEXOL 300 MG/ML  SOLN COMPARISON:  CT Abdomen and Pelvis 06/09/2021 and earlier. FINDINGS: Lower chest: Small bilateral layering pleural effusions, trace pleural fluid last year. Cardiac size remains normal. No pericardial effusion. But there is multifocal Patchy and confluent peribronchial and subpleural ground-glass lung opacity in all visible lobes. Similar ground-glass opacity was limited to the left lower lobe and in a slightly different pattern on the CT Abdomen and Pelvis last year. Hepatobiliary: Chronically absent gallbladder. Hepatomegaly, 26 cm liver length  craniocaudal, not significantly changed from last year. Otherwise negative liver. Pancreas: Negative. Spleen: Splenomegaly, estimated splenic volume 974 mL (normal splenic volume range 83 - 412 mL). But this is chronic. No discrete splenic lesion. Adrenals/Urinary Tract: Normal adrenal glands. There seems to be symmetric new pararenal space inflammatory stranding, but the bilateral renal enhancement and contrast excretion is symmetric and within normal limits. No lobar nephronia. No nephrolithiasis. Renal collecting systems appear stable. Ureters appear decompressed. Bladder is decompressed and unremarkable. Chronic pelvic phleboliths are stable. Stomach/Bowel: No dilated large or small bowel loops. Some retained stool in the colon, although there seems to be fluid in the sigmoid and rectum. No convincing large bowel inflammation. Appendix appears to remain normal on series 2, image 70. Negative terminal ileum. Decompressed stomach and duodenum. No abdominal free air or free fluid. Vascular/Lymphatic: Major arterial structures in the abdomen and pelvis appear patent and normal. Portal venous system is patent. No lymphadenopathy identified. Reproductive: Simple fluid density 4 cm left ovarian cyst. No follow-up imaging is recommended. Reference: JACR 2020 Feb;17(2):248-254 Trace free fluid suspected adjacent to the right ovary. Unremarkable uterus. Other: No layering pelvis free fluid. Musculoskeletal: Advanced chronic L4-L5 lumbar spine degeneration. Chronic postoperative changes to the lumbar spine, midline posterior paraspinal incision appears stable. Possible chronic inferior pubic ramus fractures are stable. No acute osseous abnormality identified. There is nonspecific bilateral flank subcutaneous edema which is new. IMPRESSION: 1. Small bilateral layering pleural effusions are chronic but increased, with widespread but nonspecific bilateral peribronchial and subpleural ground-glass lung opacity in all visible  lobes. Less pronounced left lower lobe ground-glass opacity was present last year. Query acute viral/atypical pneumonia. Otherwise consider a chronic non infectious inflammatory lung disease (perhaps pulmonary vasculitis or hypersensitivity pneumonitis). 2. Superimposed bilateral pararenal space inflammation, but no evidence of pyelonephritis or obstructive uropathy. Query acute intrinsic renal disease (perhaps in association with a vasculitis in #1). 3. Chronic Hepatosplenomegaly of unclear etiology. Query hepatic insufficiency. 4. No  other acute or inflammatory process identified in the abdomen or pelvis. Electronically Signed   By: Genevie Ann M.D.   On: 05/13/2022 05:42   DG Chest 2 View  Result Date: 05/05/2022 CLINICAL DATA:  Chest pain EXAM: CHEST - 2 VIEW COMPARISON:  02/25/2022 FINDINGS: Cardiac shadow is within normal limits. Lungs are well aerated bilaterally. Patchy airspace opacity is noted in the right upper lobe. No effusion is seen. No bony abnormality is noted. IMPRESSION: Early infiltrate in the right upper lobe. Electronically Signed   By: Inez Catalina M.D.   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  She had classic findings of hemolysis with elevated bilirubin, reticulocytosis, low haptoglobin.  That test was negative.  She was recently treated for atypical pneumonia with amoxicillin about 7 days prior to presentation.  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  There appears to be no history of anemia and multiple family members.  That there was no splenomegaly on exam.  She does not have not have any evidence of uremia suggestive of HUS.  She is clinically stable and  would like to go home and be evaluated by her hematologist outpatient.  I think it is definitely reasonable.  We have discussed with Dr. Tasia Catchings her hematologist about these findings.  They will get her back into the clinic. All her questions were answered to the best my knowledge.  Thank you for consulting [place] in the care of this patient.  Please do not hesitate to contact us with any additional questions or concerns.  The length of time of the face-to-face encounter was 75 minutes including History, review of records, physical exam, counseling and coordination of care.   Thank you for this referral.

## 2022-05-15 NOTE — Discharge Summary (Signed)
Physician Discharge Summary   Patient: Melinda Stone MRN: [id number] DOB: January 09, 1981  Admit date:     05/13/2022  Discharge date: 05/15/22  Discharge Physician: Edwin Dada   PCP: Lauretta Grill, NP     Recommendations at discharge:  Follow up with Dr. Tasia Catchings in 2-3 days for follow up of hemolytic anemia Follow up with Dr. Patsey Berthold Pulmonology for pneumonitis     Discharge Diagnoses: Principal Problem:   Normocytic anemia Active Problems:   Pneumonitis   Nausea & vomiting   Type 2 diabetes mellitus with hyperglycemia, without long-term current use of insulin (HCC)   Chronic pain syndrome   Tobacco use   Mild intermittent asthma   Esophageal reflux   Grade I diastolic dysfunction   Overweight (BMI 25.0-29.9)   Hypokalemia        Hospital Course: Mrs. Melinda Stone is a 42 y.o. F with DM, hx pulmonary nodules and mild asthma, radiculopathy s/p laminectomy and discectomy in 2022, anemia, chronic pain syndrome on daily opiates, and recent admission for aspiration pneumonia who presented with several days N/V.      * Normocytic anemia Hgb down to 6.7 after admission, no clinical bleeding, but reticulocyte count elevated, LDH elevated, haptoglobin low. Iron studies normal.  Hematology consulted.  DAT negative and no clear cause of hemolysis.  Blood levels stable after transfusion and so patient discharged home with close hematology follow up.    Pneumonitis On admission, CXR showed worsening infiltrates but clinical symptoms were improving.  Antibiotics were started empirically, but procalcitonin negative, RVP negative, antibiotics stopped, and symptoms continued to resolve.    Nausea & vomiting This was the initial presenting complaint, but CT abdomen normal, has resolved with symptomatic care.              The [facility] Controlled Substances Registry was reviewed for this patient prior to discharge.   Consultants:  Hematology Procedures performed: None  Disposition: Home   DISCHARGE MEDICATION: Allergies as of 05/15/2022       Reactions   Trulicity [dulaglutide] Diarrhea        Medication List     TAKE these medications    amitriptyline 25 MG tablet Commonly known as: ELAVIL TAKE 1 TABLET(25 MG) BY MOUTH AT BEDTIME What changed: See the new instructions.   cyanocobalamin 1000 MCG tablet Commonly known as: VITAMIN B12 Take 1 tablet (1,000 mcg total) by mouth daily.   empagliflozin 10 MG Tabs tablet Commonly known as: Jardiance Take 1 tablet (10 mg total) by mouth daily before breakfast.   fluconazole 150 MG tablet Commonly known as: Diflucan Take 1 tablet (150 mg total) by mouth daily.   gabapentin 800 MG tablet Commonly known as: NEURONTIN Take 800 mg by mouth in the morning, at noon, in the evening, and at bedtime.   glipiZIDE 10 MG tablet Commonly known as: GLUCOTROL Take 2 tablets (20 mg total) by mouth daily before breakfast AND 1 tablet (10 mg total) daily before supper. What changed: See the new instructions.   guaiFENesin-dextromethorphan 100-10 MG/5ML syrup Commonly known as: ROBITUSSIN DM Take 5 mLs by mouth every 4 (four) hours as needed for cough.   ondansetron 4 MG disintegrating tablet Commonly known as: ZOFRAN-ODT Take 1 tablet (4 mg total) by mouth 3 (three) times daily as needed for nausea or vomiting.   oxyCODONE-acetaminophen 5-325 MG tablet Commonly known as: PERCOCET/ROXICET Take 1 tablet by mouth 4 (four) times daily as needed for moderate pain or severe pain.   pantoprazole  40 MG tablet Commonly known as: PROTONIX Take 1 tablet (40 mg total) by mouth daily.   prednisoLONE acetate 1 % ophthalmic suspension Commonly known as: PRED FORTE Place 1 drop into the left eye 4 (four) times daily.   tiZANidine 4 MG tablet Commonly known as: ZANAFLEX Take 4 mg by mouth 2 (two) times daily.   vitamin C 250 MG tablet Commonly known as: ASCORBIC  ACID Take 2 tablets (500 mg total) by mouth daily. What changed: how much to take        Follow-up Information     Earlie Server, MD. Schedule an appointment as soon as possible for a visit in 1 week(s).   Specialty: Oncology Contact information: [place] [address] [phone]         Tyler Pita, MD. Schedule an appointment as soon as possible for a visit in 2 week(s).   Specialty: Pulmonary Disease Contact information: [place] [address] [phone]                 Discharge Instructions     Discharge instructions   Complete by: As directed    **IMPORTANT DISCHARGE INSTRUCTIONS**   From Dr. Loleta Books: You were admitted for nausea and vomiting Thankfully, this resolved quickly It may have been a viral stomach bug (ie "food poisoning") or it may have been a side effect of the antibiotics.  Either way, thankfully, it resolved.  Here, we noticed that you had an obvious anemia (low blood level)  Our hematology doctor recommends that you follow up closely with them in the office. Call Dr. Collie Siad office tomorrow to arrange a follow up appointment as soon as you can for more lab work   In the meantime, the pneumonia you had seems to be resolved. Call your primary doctor or your lung doctor (Dr. Patsey Berthold and Geraldo Pitter) to schedule a follow up appointment with one of them to make sure it is resolving.  Ask to have a chest x-ray in 4-6 weeks to document that it is resolved   Increase activity slowly   Complete by: As directed        Discharge Exam: Filed Weights   05/13/22 0026  Weight: 90.7 kg    General: Pt is alert, awake, not in acute distress Cardiovascular: RRR, nl S1-S2, no murmurs appreciated.   No LE edema.   Respiratory: Normal respiratory rate and rhythm.  CTAB without rales or wheezes. Abdominal: Abdomen soft and non-tender.  No distension or HSM.   Neuro/Psych: Strength  symmetric in upper and lower extremities.  Judgment and insight appear normal.   Condition at discharge: good  The results of significant diagnostics from this hospitalization (including imaging, microbiology, ancillary and laboratory) are listed below for reference.   Imaging Studies: DG Chest 2 View  Result Date: 05/13/2022 CLINICAL DATA:  Coughing and abdominal pain. EXAM: CHEST - 2 VIEW COMPARISON:  PA and lateral 05/05/2022 FINDINGS: The heart is borderline prominent increased slightly in size since the prior study. The mediastinal silhouette is stable. There are increased subpleural interstitial markings throughout, with a basal gradient. Interval new finding of dense perihilar infiltrates on the left-greater-than-right, on the left extending to the periphery of the mid lung. There are minimal pleural effusions also new. Remainder of the peripheral lungs are clear. Findings could be due to CHF or fluid overload, non cardiogenic edema, or pneumonia with an interstitial component. IMPRESSION: 1. Interval new finding of dense perihilar  infiltrates on the left-greater-than-right. Findings could be due to CHF or fluid overload, noncardiogenic edema or pneumonia with an interstitial component. 2. Increased subpleural interstitial markings throughout, with a basal gradient. 3. Minimal pleural effusions. Electronically Signed   By: Telford Nab M.D.   On: 05/13/2022 07:27   CT ABDOMEN PELVIS W CONTRAST  Result Date: 05/13/2022 CLINICAL DATA:  42 year old female with recurrent abdominal pain and vomiting. EXAM: CT ABDOMEN AND PELVIS WITH CONTRAST TECHNIQUE: Multidetector CT imaging of the abdomen and pelvis was performed using the standard protocol following bolus administration of intravenous contrast. RADIATION DOSE REDUCTION: This exam was performed according to the departmental dose-optimization program which includes automated exposure control, adjustment of the mA and/or kV according to patient  size and/or use of iterative reconstruction technique. CONTRAST:  147m OMNIPAQUE IOHEXOL 300 MG/ML  SOLN COMPARISON:  CT Abdomen and Pelvis 06/09/2021 and earlier. FINDINGS: Lower chest: Small bilateral layering pleural effusions, trace pleural fluid last year. Cardiac size remains normal. No pericardial effusion. But there is multifocal Patchy and confluent peribronchial and subpleural ground-glass lung opacity in all visible lobes. Similar ground-glass opacity was limited to the left lower lobe and in a slightly different pattern on the CT Abdomen and Pelvis last year. Hepatobiliary: Chronically absent gallbladder. Hepatomegaly, 26 cm liver length craniocaudal, not significantly changed from last year. Otherwise negative liver. Pancreas: Negative. Spleen: Splenomegaly, estimated splenic volume 974 mL (normal splenic volume range 83 - 412 mL). But this is chronic. No discrete splenic lesion. Adrenals/Urinary Tract: Normal adrenal glands. There seems to be symmetric new pararenal space inflammatory stranding, but the bilateral renal enhancement and contrast excretion is symmetric and within normal limits. No lobar nephronia. No nephrolithiasis. Renal collecting systems appear stable. Ureters appear decompressed. Bladder is decompressed and unremarkable. Chronic pelvic phleboliths are stable. Stomach/Bowel: No dilated large or small bowel loops. Some retained stool in the colon, although there seems to be fluid in the sigmoid and rectum. No convincing large bowel inflammation. Appendix appears to remain normal on series 2, image 70. Negative terminal ileum. Decompressed stomach and duodenum. No abdominal free air or free fluid. Vascular/Lymphatic: Major arterial structures in the abdomen and pelvis appear patent and normal. Portal venous system is patent. No lymphadenopathy identified. Reproductive: Simple fluid density 4 cm left ovarian cyst. No follow-up imaging is recommended. Reference: JACR 2020  Feb;17(2):248-254 Trace free fluid suspected adjacent to the right ovary. Unremarkable uterus. Other: No layering pelvis free fluid. Musculoskeletal: Advanced chronic L4-L5 lumbar spine degeneration. Chronic postoperative changes to the lumbar spine, midline posterior paraspinal incision appears stable. Possible chronic inferior pubic ramus fractures are stable. No acute osseous abnormality identified. There is nonspecific bilateral flank subcutaneous edema which is new. IMPRESSION: 1. Small bilateral layering pleural effusions are chronic but increased, with widespread but nonspecific bilateral peribronchial and subpleural ground-glass lung opacity in all visible lobes. Less pronounced left lower lobe ground-glass opacity was present last year. Query acute viral/atypical pneumonia. Otherwise consider a chronic non infectious inflammatory lung disease (perhaps pulmonary vasculitis or hypersensitivity pneumonitis). 2. Superimposed bilateral pararenal space inflammation, but no evidence of pyelonephritis or obstructive uropathy. Query acute intrinsic renal disease (perhaps in association with a vasculitis in #1). 3. Chronic Hepatosplenomegaly of unclear etiology. Query hepatic insufficiency. 4. No other acute or inflammatory process identified in the abdomen or pelvis. Electronically Signed   By: HGenevie AnnM.D.   On: 05/13/2022 05:42   DG Chest 2 View  Result Date: 05/05/2022 CLINICAL DATA:  Chest pain EXAM: CHEST -  2 VIEW COMPARISON:  02/25/2022 FINDINGS: Cardiac shadow is within normal limits. Lungs are well aerated bilaterally. Patchy airspace opacity is noted in the right upper lobe. No effusion is seen. No bony abnormality is noted. IMPRESSION: Early infiltrate in the right upper lobe. Electronically Signed   By: Inez Catalina M.D.   On: 05/05/2022 01:54    Microbiology: Results for orders placed or performed during the hospital encounter of 05/13/22  Blood culture (routine x 2)     Status: None (Preliminary  result)   Collection Time: 05/13/22  7:54 AM   Specimen: BLOOD RIGHT WRIST  Result Value Ref Range Status   Specimen Description   Final    BLOOD RIGHT WRIST Performed at [facility] [address] [place], [place] [address]    Special Requests   Final    BOTTLES DRAWN AEROBIC AND ANAEROBIC Blood Culture results may not be optimal due to an inadequate volume of blood received in culture bottles Performed at [facility] [address], [place], [place] [address]    Culture   Final    NO GROWTH 2 DAYS Performed at [facility] [address] [place], [place] [address]    Report Status PENDING  Incomplete  MRSA Next Gen by PCR, Nasal     Status: None   Collection Time: 05/13/22 11:52 AM   Specimen: Nasal Mucosa; Nasal Swab  Result Value Ref Range Status   MRSA by PCR Next Gen NOT DETECTED NOT DETECTED Final    Comment: (NOTE) The GeneXpert MRSA Assay (FDA approved for NASAL specimens only), is one component of a comprehensive MRSA colonization surveillance program. It is not intended to diagnose MRSA infection nor to guide or monitor treatment for MRSA infections. Test performance is not FDA approved in patients less than 30 years old. Performed at [facility], [place] [address] [place], [place] [address]   Blood culture (routine x 2)     Status: None (Preliminary result)   Collection Time: 05/13/22 11:53 AM   Specimen: BLOOD  Result Value Ref Range Status   Specimen Description   Final    BLOOD BLOOD LEFT HAND Performed at [facility] [address] [place], [place] [address]    Special Requests   Final    BOTTLES DRAWN AEROBIC AND ANAEROBIC Blood Culture adequate volume Performed at [facility] [address] [place], [place] [address]    Culture   Final    NO GROWTH 2 DAYS Performed at [facility] [address], [place], [place] [address]    Report Status PENDING   Incomplete  Expectorated Sputum Assessment w Gram Stain, Rflx to Resp Cult     Status: None   Collection Time: 05/13/22  3:53 PM   Specimen: Expectorated Sputum  Result Value Ref Range Status   Specimen Description EXPECTORATED SPUTUM  Final   Special Requests NONE  Final   Sputum evaluation   Final    Sputum specimen not acceptable for testing.  Please recollect.   Performed at [facility], [place] [address] [place], [place] [address]    Report Status 05/13/2022 FINAL  Final  Respiratory (~20 pathogens) panel by PCR     Status: None   Collection Time: 05/14/22 12:19 PM   Specimen: Nasopharyngeal Swab; Respiratory  Result Value Ref Range Status   Adenovirus NOT DETECTED NOT DETECTED Final   Coronavirus 229E NOT DETECTED NOT DETECTED  Final    Comment: (NOTE) The Coronavirus on the Respiratory Panel, DOES NOT test for the novel  Coronavirus (2019 nCoV)    Coronavirus HKU1 NOT DETECTED NOT DETECTED Final   Coronavirus NL63 NOT DETECTED NOT DETECTED Final   Coronavirus OC43 NOT DETECTED NOT DETECTED Final   Metapneumovirus NOT DETECTED NOT DETECTED Final   Rhinovirus / Enterovirus NOT DETECTED NOT DETECTED Final   Influenza A NOT DETECTED NOT DETECTED Final   Influenza B NOT DETECTED NOT DETECTED Final   Parainfluenza Virus 1 NOT DETECTED NOT DETECTED Final   Parainfluenza Virus 2 NOT DETECTED NOT DETECTED Final   Parainfluenza Virus 3 NOT DETECTED NOT DETECTED Final   Parainfluenza Virus 4 NOT DETECTED NOT DETECTED Final   Respiratory Syncytial Virus NOT DETECTED NOT DETECTED Final   Bordetella pertussis NOT DETECTED NOT DETECTED Final   Bordetella Parapertussis NOT DETECTED NOT DETECTED Final   Chlamydophila pneumoniae NOT DETECTED NOT DETECTED Final   Mycoplasma pneumoniae NOT DETECTED NOT DETECTED Final    Comment: Performed at [facility], [place] [address] [place], [place] [address]    Labs: CBC: Recent Labs  Lab 05/13/22 [phone] 05/14/22 0337  05/14/22 1553 05/15/22 0439  WBC 8.4 7.4 11.3* 9.5  NEUTROABS  --   --  8.3*  --   HGB 7.8* 6.7* 8.5* 8.2*  HCT 23.6* 20.5* 26.6* 25.2*  MCV 84.3 85.1 87.2 86.9  PLT 232 200 227 [id number]   Basic Metabolic Panel: Recent Labs  Lab 05/13/22 0415 05/13/22 0501 05/13/22 1155 05/14/22 0337 05/15/22 0439  NA 135  --   --  135 137  K 3.1*  --   --  3.4* 4.0  CL 106  --   --  108 109  CO2 21*  --   --  21* 22  GLUCOSE 223*  --   --  257* 283*  BUN <5*  --   --  <5* 5*  CREATININE 0.58  --   --  0.56 0.74  CALCIUM 8.1*  --   --  7.9* 8.6*  MG  --  1.8  --   --   --   PHOS  --   --  2.9  --   --    Liver Function Tests: Recent Labs  Lab 05/13/22 0415 05/14/22 0337 05/14/22 1103 05/15/22 0439  AST '22 17 16 16  '$ ALT '26 19 19 17  '$ ALKPHOS 114 94 99 93  BILITOT 1.8* 1.6* 1.9* 1.4*  PROT 6.9 6.0* 6.1* 6.2*  ALBUMIN 3.2* 2.8* 2.9* 3.1*   CBG: Recent Labs  Lab 05/14/22 1145 05/14/22 1641 05/14/22 2122 05/15/22 0818 05/15/22 1221  GLUCAP 255* 139* 161* 258* 201*    Discharge time spent: approximately 35 minutes spent on discharge counseling, evaluation of patient on day of discharge, and coordination of discharge planning with nursing, social work, pharmacy and case management  Signed: Edwin Dada, MD [facility] 05/15/2022

## 2022-05-16 ENCOUNTER — Telehealth: Payer: Self-pay

## 2022-05-16 DIAGNOSIS — D519 Vitamin B12 deficiency anemia, unspecified: Secondary | ICD-10-CM

## 2022-05-16 NOTE — Telephone Encounter (Signed)
Patient discharged fro ED yesterday and they request for follow up. Please contact pt to schedule her for repeat blood work this Friday. (Lab only, will schedule MD depending on lab results)

## 2022-05-18 ENCOUNTER — Inpatient Hospital Stay: Payer: Medicaid Other | Attending: Oncology

## 2022-05-18 DIAGNOSIS — D519 Vitamin B12 deficiency anemia, unspecified: Secondary | ICD-10-CM

## 2022-05-18 DIAGNOSIS — D509 Iron deficiency anemia, unspecified: Secondary | ICD-10-CM | POA: Diagnosis present

## 2022-05-18 DIAGNOSIS — E538 Deficiency of other specified B group vitamins: Secondary | ICD-10-CM | POA: Insufficient documentation

## 2022-05-18 LAB — CBC WITH DIFFERENTIAL (CANCER CENTER ONLY)
Abs Immature Granulocytes: 0.05 10*3/uL (ref 0.00–0.07)
Basophils Absolute: 0.1 10*3/uL (ref 0.0–0.1)
Basophils Relative: 1 %
Eosinophils Absolute: 0 10*3/uL (ref 0.0–0.5)
Eosinophils Relative: 0 %
HCT: 30.2 % — ABNORMAL LOW (ref 36.0–46.0)
Hemoglobin: 9.8 g/dL — ABNORMAL LOW (ref 12.0–15.0)
Immature Granulocytes: 1 %
Lymphocytes Relative: 21 %
Lymphs Abs: 2 10*3/uL (ref 0.7–4.0)
MCH: 27.7 pg (ref 26.0–34.0)
MCHC: 32.5 g/dL (ref 30.0–36.0)
MCV: 85.3 fL (ref 80.0–100.0)
Monocytes Absolute: 0.4 10*3/uL (ref 0.1–1.0)
Monocytes Relative: 5 %
Neutro Abs: 6.7 10*3/uL (ref 1.7–7.7)
Neutrophils Relative %: 72 %
Platelet Count: 240 10*3/uL (ref 150–400)
RBC: 3.54 MIL/uL — ABNORMAL LOW (ref 3.87–5.11)
RDW: 22.9 % — ABNORMAL HIGH (ref 11.5–15.5)
WBC Count: 9.3 10*3/uL (ref 4.0–10.5)
nRBC: 0.2 % (ref 0.0–0.2)

## 2022-05-18 LAB — HEPATIC FUNCTION PANEL
ALT: 15 U/L (ref 0–44)
AST: 15 U/L (ref 15–41)
Albumin: 3.6 g/dL (ref 3.5–5.0)
Alkaline Phosphatase: 100 U/L (ref 38–126)
Bilirubin, Direct: 0.3 mg/dL — ABNORMAL HIGH (ref 0.0–0.2)
Indirect Bilirubin: 0.7 mg/dL (ref 0.3–0.9)
Total Bilirubin: 1 mg/dL (ref 0.3–1.2)
Total Protein: 7.1 g/dL (ref 6.5–8.1)

## 2022-05-18 LAB — RETIC PANEL
Immature Retic Fract: 12.4 % (ref 2.3–15.9)
RBC.: 3.35 MIL/uL — ABNORMAL LOW (ref 3.87–5.11)
Retic Count, Absolute: 504.5 10*3/uL — ABNORMAL HIGH (ref 19.0–186.0)
Retic Ct Pct: 15.1 % — ABNORMAL HIGH (ref 0.4–3.1)
Reticulocyte Hemoglobin: 29.6 pg (ref >27.9)

## 2022-05-18 LAB — CULTURE, BLOOD (ROUTINE X 2)
Culture: NO GROWTH
Culture: NO GROWTH
Special Requests: ADEQUATE

## 2022-05-18 LAB — SAMPLE TO BLOOD BANK

## 2022-05-18 NOTE — Telephone Encounter (Signed)
Can we get update on PA that was requested on 05/08/22 for Jardiance.

## 2022-05-21 ENCOUNTER — Telehealth: Payer: Self-pay | Admitting: Pharmacy Technician

## 2022-05-21 ENCOUNTER — Ambulatory Visit (INDEPENDENT_AMBULATORY_CARE_PROVIDER_SITE_OTHER): Payer: Medicaid Other | Admitting: Neurology

## 2022-05-21 ENCOUNTER — Other Ambulatory Visit (HOSPITAL_COMMUNITY): Payer: Self-pay

## 2022-05-21 DIAGNOSIS — G629 Polyneuropathy, unspecified: Secondary | ICD-10-CM

## 2022-05-21 DIAGNOSIS — R202 Paresthesia of skin: Secondary | ICD-10-CM | POA: Diagnosis not present

## 2022-05-21 DIAGNOSIS — G5621 Lesion of ulnar nerve, right upper limb: Secondary | ICD-10-CM

## 2022-05-21 NOTE — Telephone Encounter (Addendum)
Pharmacy Patient Advocate Encounter   Received notification from Pt calls msgs/CMA that prior authorization for Jardiance '10mg'$  is required/requested.  Per Test Claim: trial of Metformin   PA submitted on 05/21/22 to (ins)  [place] Medicaid via [employer] or (Medicaid) confirmation # [id number]  PA Case ID: [id number]  Status is pending

## 2022-05-21 NOTE — Procedures (Signed)
[facility]  [place], [place]  [place], [place] [address] Tel: [phone] Fax: [phone] Test Date:  05/21/2022  Patient: Melinda Stone DOB: Jan 05, 1981 Physician: Kai Levins, MD  Sex: Female Height: '5\' 9"'$  Ref Phys: Ileene Rubens, MD  ID#: [id number]   Technician:    History: This is a 42 year old female with right hand numbness.  NCV & EMG Findings: Extensive electrodiagnostic evaluation of the right upper limb with additional nerve conduction studies of the left upper limb shows: Bilateral ulnar sensory responses are absent. Bilateral median sensory responses show prolonged distal latency (L 3.5, R 3.5 ms) and reduced amplitude (L 11, R 8 V). Bilateral radial sensory responses show reduced amplitude (L 12, R 14 V) and right radial sensory response has prolonged distal latency (2.8 ms). Bilateral median (APB) motor responses show reduced amplitude (L 5.6, R 4.4 mV). Right median (APB) conduction velocity is decreased (46 m/s). Bilateral ulnar (ADM) motor responses show reduced amplitude (L 3.1, R 4.3 mV) and the right ulnar (ADM) motor response has prolonged distal latency (3.2 ms). Bilateral ulnar (ADM) motor responses show decreased conduction velocity from above elbow to below elbow stimulation sites (L 43, R 33 m/s). Chronic motor axon loss changes without active denervation changes are seen in the right first dorsal interosseous, right extensor indicis proprius, and right abductor pollicis brevis muscles. All other tested muscles are within normal limits with normal motor unit configuration and recruitment patterns.  Impression: This is a complex study. 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 Possible bilateral median mononeuropathy at or distal to the wrist, ie: carpal tunnel syndrome, but given overlapping #1 above, difficult to determine. No definitive electrodiagnostic evidence of a right cervical (C5-T1) motor radiculopathy, but cannot exclude an overlapping right C8 or T1 radiculopathy.   ___________________________ Kai Levins, MD    Nerve Conduction Studies Motor Nerve Results    Latency Amplitude F-Lat Segment Distance CV Comment  Site (ms) Norm (mV) Norm (ms)  (cm) (m/s) Norm   Left Median (APB) Motor  Wrist 3.7  < 3.9 *5.6  > 6.0        Elbow 8.8 - 4.8 -  Elbow-Wrist 27 53  > 50   Right Median (APB) Motor  Wrist 3.8  < 3.9 *4.4  > 6.0        Elbow 9.7 - 3.4 -  Elbow-Wrist 27 *46  > 50   Left Ulnar (ADM) Motor  Wrist 3.0  < 3.1 *3.1  > 7.0        Bel elbow 7.6 - 2.7 -  Bel elbow-Wrist 23 50  > 50   Ab elbow 9.9 - 2.5 -  Ab elbow-Bel elbow 10 43 -   Right Ulnar (ADM) Motor  Wrist *3.2  < 3.1 *4.3  > 7.0        Bel elbow 7.6 - 1.45 -  Bel elbow-Wrist 23.5 53  > 50   Ab elbow 10.6 - 1.39 -  Ab elbow-Bel elbow 10 33 -    Sensory Sites    Neg  Peak Lat Amplitude (O-P) Segment Distance Velocity Comment  Site (ms) Norm (V) Norm  (cm) (ms)   Left Median Sensory  Wrist-Dig II *3.5  < 3.4 *11  > 20 Wrist-Dig II 13    Right Median Sensory  Wrist-Dig II *3.5  < 3.4 *8  > 20 Wrist-Dig II 13    Left Radial Sensory  Forearm-Wrist *2.8  < 2.7 *12  > 18 Forearm-Wrist 10    Right Radial Sensory  Forearm-Wrist 2.7  < 2.7 *14  > 18 Forearm-Wrist 10    Left Ulnar Sensory  Wrist-Dig V *NR  < 3.1 *NR  > 12 Wrist-Dig V 11    Right Ulnar Sensory  Wrist-Dig V *NR  < 3.1 *NR  > 12 Wrist-Dig V 11     Electromyography   Side Muscle Ins.Act Fibs Fasc Recrt Amp Dur Poly  Activation Comment  Right FDI Nml Nml Nml *2- *1+ *2+ *1+ Nml N/A  Right EIP Nml Nml Nml *1- *1+ *1+ *1+ Nml N/A  Right FPL Nml Nml Nml Nml Nml Nml Nml Nml N/A  Right APB Nml Nml Nml *2- *1+ *1+ *1+ Nml N/A  Right Pronator teres Nml Nml Nml Nml Nml Nml Nml Nml N/A  Right FCU Nml Nml Nml Nml Nml Nml Nml Nml N/A  Right Biceps Nml Nml Nml Nml Nml Nml Nml Nml N/A  Right Triceps Nml Nml Nml Nml Nml Nml Nml Nml N/A  Right Deltoid Nml Nml Nml Nml Nml Nml Nml Nml N/A      Waveforms:  Motor           Sensory

## 2022-05-23 NOTE — Telephone Encounter (Signed)
Patient Advocate Encounter  Prior Authorization for Jardiance '10MG'$  tablets has been approved through [employer].    Key: [id number]  Effective: 05-21-2022 to 05-21-2023

## 2022-05-24 ENCOUNTER — Ambulatory Visit: Payer: Self-pay

## 2022-05-24 ENCOUNTER — Ambulatory Visit (INDEPENDENT_AMBULATORY_CARE_PROVIDER_SITE_OTHER): Payer: Medicaid Other | Admitting: Physical Medicine and Rehabilitation

## 2022-05-24 VITALS — BP 129/85 | HR 97

## 2022-05-24 DIAGNOSIS — M5416 Radiculopathy, lumbar region: Secondary | ICD-10-CM

## 2022-05-24 MED ORDER — METHYLPREDNISOLONE ACETATE 80 MG/ML IJ SUSP
80.0000 mg | Freq: Once | INTRAMUSCULAR | Status: AC
  Administered 2022-05-24: 80 mg

## 2022-05-24 NOTE — Progress Notes (Signed)
Functional Pain Scale - descriptive words and definitions  Intense (8)    Cannot complete any ADLs without much assistance/cannot concentrate/conversation is difficult/unable to sleep and unable to use distraction. Severe range order  Average Pain 8   +Driver, -BT, -Dye Allergies.  Lower back pain on left side that radiates into left leg

## 2022-05-24 NOTE — Patient Instructions (Signed)

## 2022-05-30 ENCOUNTER — Encounter: Payer: Self-pay | Admitting: Obstetrics & Gynecology

## 2022-05-30 ENCOUNTER — Ambulatory Visit (INDEPENDENT_AMBULATORY_CARE_PROVIDER_SITE_OTHER): Payer: Medicaid Other | Admitting: Obstetrics & Gynecology

## 2022-05-30 ENCOUNTER — Other Ambulatory Visit (HOSPITAL_COMMUNITY)
Admission: RE | Admit: 2022-05-30 | Discharge: 2022-05-30 | Disposition: A | Discharge status: Home / Self Care | Payer: Medicaid Other | Source: Ambulatory Visit | Attending: Obstetrics & Gynecology | Admitting: Obstetrics & Gynecology

## 2022-05-30 VITALS — BP 129/87 | HR 97 | Ht 69.0 in | Wt 205.0 lb

## 2022-05-30 DIAGNOSIS — Z01419 Encounter for gynecological examination (general) (routine) without abnormal findings: Secondary | ICD-10-CM | POA: Diagnosis not present

## 2022-05-30 DIAGNOSIS — Z1339 Encounter for screening examination for other mental health and behavioral disorders: Secondary | ICD-10-CM

## 2022-05-30 DIAGNOSIS — N921 Excessive and frequent menstruation with irregular cycle: Secondary | ICD-10-CM

## 2022-05-30 NOTE — Progress Notes (Signed)
Patient ID: Melinda Stone, female   DOB: [birth year], 42 y.o.   MRN: [id number]  Chief Complaint  Patient presents with   New Patient (Initial Visit)   Annual Exam    HPI Melinda Stone is a 42 y.o. female with a pmh of T2DM, chronic pain syndrome, vitamin B12 deficiency and iron deficiency presents today to establish care.  HPI Concerns today include heavy periods. They became heavy about year ago. The first 3 days of her cycle she reports having to use 3 depends per day and then has spotting for 7-10 days following. She endorsed abdominal cramping, back pain, and lightheadedness the first 3 days. Periods are regular. LMP was 2/19. She had a tubal ligation in 10/2010 and is not on hormonal BC options. She recently had a CT of abdomen/pelvis which showed no abnormalities/masses of the uterus or ovaries. She states she wants a hysterectomy. Last PAP was "years" ago - no hx of abnormal pap. She as not received a mammogram but does perform breast exams at home. Denies fhx of breast cancer. Denies any recent illness, change in bowel movements, change in weight. Pt report abdominal cramping, back pain, and fatigue related to her menstrual cycle.   Past Medical History:  Diagnosis Date   Anemia    Arthritis 08/17/2020   Chronic back pain    Diabetes mellitus without complication (HCC)    type 2   GERD (gastroesophageal reflux disease)    Migraine    Ovarian cyst    Pneumonia    Polyneuropathy     Past Surgical History:  Procedure Laterality Date   CESAREAN SECTION     CHOLECYSTECTOMY  2004   LUMBAR LAMINECTOMY N/A 05/30/2020   Procedure: Lumbar five Laminectomy,  Bilateral Microdiscectomy, Left Lumbar five -Sacral one Microdiscectomy;  Surgeon: Marybelle Killings, MD;  Location: [facility];  Service: Orthopedics;  Laterality: N/A;   LUMBAR LAMINECTOMY Left 11/14/2020   Procedure: LEFT LUMBAR FOUR-FIVE MICRODISCECTOMY;  Surgeon: Marybelle Killings, MD;  Location: [facility];  Service: Orthopedics;   Laterality: Left;   TUBAL LIGATION  10/19/2010    Family History  Problem Relation Age of Onset   Diabetes Mother    Hypertension Mother    Stroke Mother     Social History Social History   Tobacco Use   Smoking status: Former    Types: Cigars    Quit date: 12/23/2021    Years since quitting: 0.4   Smokeless tobacco: Never   Tobacco comments:    Not smoking as of 12/23/21. Verified 01/15/22 LW  Vaping Use   Vaping Use: Never used  Substance Use Topics   Alcohol use: Not Currently   Drug use: Not Currently    Comment: per patient stopped smoking marijuana Mar 2022    Allergies  Allergen Reactions   Trulicity [Dulaglutide] Diarrhea    Current Outpatient Medications  Medication Sig Dispense Refill   amitriptyline (ELAVIL) 25 MG tablet TAKE 1 TABLET(25 MG) BY MOUTH AT BEDTIME (Patient taking differently: Take 25 mg by mouth at bedtime.) 30 tablet 3   cyanocobalamin (VITAMIN B12) 1000 MCG tablet Take 1 tablet (1,000 mcg total) by mouth daily. 90 tablet 1   empagliflozin (JARDIANCE) 10 MG TABS tablet Take 1 tablet (10 mg total) by mouth daily before breakfast. 90 tablet 2   gabapentin (NEURONTIN) 800 MG tablet Take 800 mg by mouth in the morning, at noon, in the evening, and at bedtime.     glipiZIDE (GLUCOTROL) 10 MG  tablet Take 2 tablets (20 mg total) by mouth daily before breakfast AND 1 tablet (10 mg total) daily before supper. (Patient taking differently: Take 20 mg by mouth twice daily. ) 270 tablet 3   ondansetron (ZOFRAN-ODT) 4 MG disintegrating tablet Take 1 tablet (4 mg total) by mouth 3 (three) times daily as needed for nausea or vomiting. 30 tablet 2   oxyCODONE-acetaminophen (PERCOCET/ROXICET) 5-325 MG tablet Take 1 tablet by mouth 4 (four) times daily as needed for moderate pain or severe pain.     pantoprazole (PROTONIX) 40 MG tablet Take 1 tablet (40 mg total) by mouth daily. 30 tablet 0   prednisoLONE acetate (PRED FORTE) 1 % ophthalmic suspension Place 1 drop  into the left eye 4 (four) times daily.     tiZANidine (ZANAFLEX) 4 MG tablet Take 4 mg by mouth 2 (two) times daily.     vitamin C (ASCORBIC ACID) 250 MG tablet Take 2 tablets (500 mg total) by mouth daily. (Patient taking differently: Take 250 mg by mouth daily.) 180 tablet 0   fluconazole (DIFLUCAN) 150 MG tablet Take 1 tablet (150 mg total) by mouth daily. 1 tablet 0   guaiFENesin-dextromethorphan (ROBITUSSIN DM) 100-10 MG/5ML syrup Take 5 mLs by mouth every 4 (four) hours as needed for cough. 118 mL 0   metroNIDAZOLE (FLAGYL) 500 MG tablet Take 500 mg by mouth 2 (two) times daily. (Patient not taking: Reported on 05/30/2022)     Current Facility-Administered Medications  Medication Dose Route Frequency Provider Last Rate Last Admin   methylPREDNISolone acetate (DEPO-MEDROL) injection 80 mg  80 mg Other Once Magnus Sinning, MD        Review of Systems Review of Systems  Constitutional:  Positive for fatigue. Negative for appetite change and fever.  HENT:  Negative for rhinorrhea and sore throat.   Respiratory:  Negative for cough and shortness of breath.   Cardiovascular:  Negative for chest pain.  Gastrointestinal:  Positive for abdominal pain. Negative for constipation and diarrhea.  Endocrine: Negative for polyphagia and polyuria.  Genitourinary:  Positive for menstrual problem. Negative for dyspareunia, dysuria and urgency.  Musculoskeletal:  Positive for back pain. Negative for myalgias.  Skin:  Negative for rash.  Neurological:  Positive for light-headedness. Negative for syncope, weakness and headaches.    Blood pressure 129/87, pulse 97, height '5\' 9"'$  (1.753 m), weight 93 kg, last menstrual period 05/07/2022.  Physical Exam Vitals:   05/30/22 0853 05/30/22 0915  BP: (!) 146/88 129/87  Pulse: 93 97  Weight: 93 kg   Height: '5\' 9"'$  (1.753 m)    General: Vital signs reviewed.  Patient is well-developed and well-nourished, in no acute distress and cooperative with exam.   Head: Normocephalic and atraumatic. Eyes: EOMI, conjunctivae normal, no scleral icterus.  Neck: Supple, trachea midline, normal ROM, no thyromegaly  Cardiovascular: RRR, S1 normal, S2 normal, no murmurs, gallops, or rubs. Pulmonary/Chest: Clear to auscultation bilaterally, no wheezes, rales, or rhonchi. Abdominal: Soft, non-tender, non-distended, no masses present. Breast: No erythema, edema or lesions present. No nipple discharge present or axillary/cervical lymphadenopathy. No obvious masses noted, possible fibrocystic changes present bilaterally. Exam limited due to body habitus and breast side.    Pelvic: Exam preformed with chaperone present. External genitalia without erythema, edema, or lesion. Cervix appears normal. Minimal discharge present. Bimanual not preformed today Musculoskeletal: ROM full  Extremities: No lower extremity edema bilaterally. No cyanosis or clubbing. Neurological: A&O x3, cranial nerve II-XII are grossly intact Skin: Warm, dry and intact.  No rashes or erythema. Psychiatric: Normal mood and affect.     Assessment 42 year old female with complaints of dysmenorrhea presenting to establish care  Plan 1. Well woman exam with routine gynecological exam - PAP, Cervicovaginal ancillary, Hep C ab, Hep B surface ag, RPR, HIV ab with reflex ordered - MM Digital Screening; Future   2. Menometrorrhagia - Discussed hormonal BC options for management and that hysterectomy is not a first line treatment. - Pt given information on IUD and nexplanon  - US PELVIC COMPLETE WITH TRANSVAGINAL; Future - CBC     [facility] 05/30/2022, 10:39 AM  Attestation of Attending Supervision of PA Student: Evaluation and management procedures were performed by the PA student under my supervision and collaboration.  I have reviewed the student's note and chart, and I agree with the management and plan.  Emeterio Reeve, MD, [place] Attending [place], [facility]

## 2022-05-30 NOTE — Progress Notes (Signed)
Pt states she has heavy cycles, causing anemia. Pt states cycles may last 1-2 weeks. Pt is interested in Hysterectomy.  Pt states has been several years since last GYN exam.  GAD score 15, PHQ score 14.  Pt states she currently has a therapist.

## 2022-05-31 LAB — CERVICOVAGINAL ANCILLARY ONLY
Bacterial Vaginitis (gardnerella): POSITIVE — AB
Chlamydia: NEGATIVE
Comment: NEGATIVE
Comment: NEGATIVE
Comment: NEGATIVE
Comment: NORMAL
Neisseria Gonorrhea: NEGATIVE
Trichomonas: NEGATIVE

## 2022-05-31 LAB — CYTOLOGY - PAP
Adequacy: ABSENT
Comment: NEGATIVE
Diagnosis: NEGATIVE
High risk HPV: NEGATIVE

## 2022-05-31 LAB — CBC
Hematocrit: 36.2 % (ref 34.0–46.6)
Hemoglobin: 11.5 g/dL (ref 11.1–15.9)
MCH: 26.4 pg — ABNORMAL LOW (ref 26.6–33.0)
MCHC: 31.8 g/dL (ref 31.5–35.7)
MCV: 83 fL (ref 79–97)
Platelets: 189 10*3/uL (ref 150–450)
RBC: 4.36 x10E6/uL (ref 3.77–5.28)
RDW: 18.5 % — ABNORMAL HIGH (ref 11.7–15.4)
WBC: 7.2 10*3/uL (ref 3.4–10.8)

## 2022-05-31 LAB — HIV ANTIBODY (ROUTINE TESTING W REFLEX): HIV Screen 4th Generation wRfx: NONREACTIVE

## 2022-05-31 LAB — HEPATITIS B SURFACE ANTIGEN: Hepatitis B Surface Ag: NEGATIVE

## 2022-05-31 LAB — HEPATITIS C ANTIBODY: Hep C Virus Ab: NONREACTIVE

## 2022-05-31 LAB — RPR: RPR Ser Ql: NONREACTIVE

## 2022-06-04 ENCOUNTER — Other Ambulatory Visit: Payer: Self-pay | Admitting: Obstetrics & Gynecology

## 2022-06-04 NOTE — Procedures (Signed)
Lumbosacral Transforaminal Epidural Steroid Injection - Sub-Pedicular Approach with Fluoroscopic Guidance  Patient: Melinda Stone      Date of Birth: 09-15-1980 MRN: [id number] PCP: Lauretta Grill, NP      Visit Date: 05/24/2022   Universal Protocol:    Date/Time: 05/24/2022  Consent Given By: the patient  Position: PRONE  Additional Comments: Vital signs were monitored before and after the procedure. Patient was prepped and draped in the usual sterile fashion. The correct patient, procedure, and site was verified.   Injection Procedure Details:   Procedure diagnoses: Lumbar radiculopathy [M54.16]    Meds Administered:  Meds ordered this encounter  Medications   methylPREDNISolone acetate (DEPO-MEDROL) injection 80 mg    Laterality: Left  Location/Site: L4 and L5  Needle:5.0 in., 22 ga.  Short bevel or Quincke spinal needle  Needle Placement: Transforaminal  Findings:    -Comments: Excellent flow of contrast along the nerve, nerve root and into the epidural space.  Procedure Details: After squaring off the end-plates to get a true AP view, the C-arm was positioned so that an oblique view of the foramen as noted above was visualized. The target area is just inferior to the "nose of the scotty dog" or sub pedicular. The soft tissues overlying this structure were infiltrated with 2-3 ml. of 1% Lidocaine without Epinephrine.  The spinal needle was inserted toward the target using a "trajectory" view along the fluoroscope beam.  Under AP and lateral visualization, the needle was advanced so it did not puncture dura and was located close the 6 O'Clock position of the pedical in AP tracterory. Biplanar projections were used to confirm position. Aspiration was confirmed to be negative for CSF and/or blood. A 1-2 ml. volume of Isovue-250 was injected and flow of contrast was noted at each level. Radiographs were obtained for documentation purposes.   After attaining the  desired flow of contrast documented above, a 0.5 to 1.0 ml test dose of 0.25% Marcaine was injected into each respective transforaminal space.  The patient was observed for 90 seconds post injection.  After no sensory deficits were reported, and normal lower extremity motor function was noted,   the above injectate was administered so that equal amounts of the injectate were placed at each foramen (level) into the transforaminal epidural space.   Additional Comments:  No complications occurred Dressing: 2 x 2 sterile gauze and Band-Aid    Post-procedure details: Patient was observed during the procedure. Post-procedure instructions were reviewed.  Patient left the clinic in stable condition.

## 2022-06-04 NOTE — Progress Notes (Signed)
Melinda Stone - 42 y.o. female MRN [id number]  Date of birth: [birth year]  Office Visit Note: Visit Date: 05/24/2022 PCP: Lauretta Grill, NP Referred by: Callie Fielding, MD  Subjective: Chief Complaint  Patient presents with   Lower Back - Pain   HPI:  Melinda Stone is a 42 y.o. female who comes in today at the request of Dr. Ileene Rubens for planned Left L4-5 and L5-S1 Lumbar Transforaminal epidural steroid injection with fluoroscopic guidance.  The patient has failed conservative care including home exercise, medications, time and activity modification.  This injection will be diagnostic and hopefully therapeutic.  Please see requesting physician notes for further details and justification.   ROS Otherwise per HPI.  Assessment & Plan: Visit Diagnoses:    ICD-10-CM   1. Lumbar radiculopathy  M54.16 XR C-ARM NO REPORT    Epidural Steroid injection    methylPREDNISolone acetate (DEPO-MEDROL) injection 80 mg      Plan: No additional findings.   Meds & Orders:  Meds ordered this encounter  Medications   methylPREDNISolone acetate (DEPO-MEDROL) injection 80 mg    Orders Placed This Encounter  Procedures   XR C-ARM NO REPORT   Epidural Steroid injection    Follow-up: No follow-ups on file.   Procedures: No procedures performed  Lumbosacral Transforaminal Epidural Steroid Injection - Sub-Pedicular Approach with Fluoroscopic Guidance  Patient: Melinda Stone      Date of Birth: 1980-04-29 MRN: [id number] PCP: Lauretta Grill, NP      Visit Date: 05/24/2022   Universal Protocol:    Date/Time: 05/24/2022  Consent Given By: the patient  Position: PRONE  Additional Comments: Vital signs were monitored before and after the procedure. Patient was prepped and draped in the usual sterile fashion. The correct patient, procedure, and site was verified.   Injection Procedure Details:   Procedure diagnoses: Lumbar radiculopathy [M54.16]     Meds Administered:  Meds ordered this encounter  Medications   methylPREDNISolone acetate (DEPO-MEDROL) injection 80 mg    Laterality: Left  Location/Site: L4 and L5  Needle:5.0 in., 22 ga.  Short bevel or Quincke spinal needle  Needle Placement: Transforaminal  Findings:    -Comments: Excellent flow of contrast along the nerve, nerve root and into the epidural space.  Procedure Details: After squaring off the end-plates to get a true AP view, the C-arm was positioned so that an oblique view of the foramen as noted above was visualized. The target area is just inferior to the "nose of the scotty dog" or sub pedicular. The soft tissues overlying this structure were infiltrated with 2-3 ml. of 1% Lidocaine without Epinephrine.  The spinal needle was inserted toward the target using a "trajectory" view along the fluoroscope beam.  Under AP and lateral visualization, the needle was advanced so it did not puncture dura and was located close the 6 O'Clock position of the pedical in AP tracterory. Biplanar projections were used to confirm position. Aspiration was confirmed to be negative for CSF and/or blood. A 1-2 ml. volume of Isovue-250 was injected and flow of contrast was noted at each level. Radiographs were obtained for documentation purposes.   After attaining the desired flow of contrast documented above, a 0.5 to 1.0 ml test dose of 0.25% Marcaine was injected into each respective transforaminal space.  The patient was observed for 90 seconds post injection.  After no sensory deficits were reported, and normal lower extremity motor function was noted,   the above injectate was administered  so that equal amounts of the injectate were placed at each foramen (level) into the transforaminal epidural space.   Additional Comments:  No complications occurred Dressing: 2 x 2 sterile gauze and Band-Aid    Post-procedure details: Patient was observed during the procedure. Post-procedure  instructions were reviewed.  Patient left the clinic in stable condition.    Clinical History: MRI LUMBAR SPINE WITHOUT AND WITH CONTRAST   TECHNIQUE: Multiplanar and multiecho pulse sequences of the lumbar spine were obtained without and with intravenous contrast.   CONTRAST:  51mL MULTIHANCE GADOBENATE DIMEGLUMINE 529 MG/ML IV SOLN   COMPARISON:  Prior MRI from 12/02/2020.   FINDINGS: Segmentation: Standard. Lowest well-formed disc space labeled the L5-S1 level.   Alignment: Slight focal kyphotic angulation at L4-5, stable. Trace 3 mm retrolisthesis of L5 on S1, also unchanged. Alignment otherwise normal with preservation of the normal lumbar lordosis.   Vertebrae: Vertebral body height maintained without acute or interval fracture. Bone marrow signal intensity diffusely heterogeneous without worrisome osseous lesion. Discogenic reactive endplate change with marrow edema and enhancement present about the L4-5 interspace, somewhat progressed from prior. No other abnormal marrow edema or enhancement.   Conus medullaris and cauda equina: Conus extends to the L1 level. Conus and cauda equina appear normal.   Paraspinal and other soft tissues: Chronic postoperative changes present within the posterior paraspinous soft tissues. No adverse features.   Disc levels:   L1-2:  Unremarkable.   L2-3: Negative interspace. Mild ligament flavum hypertrophy. No canal or foraminal stenosis.   L3-4: Negative interspace. Mild bilateral facet and ligament flavum hypertrophy. No significant spinal stenosis. Foramina remain patent.   L4-5: Degenerative intervertebral disc space narrowing with disc desiccation and diffuse disc bulge. Associated reactive endplate change with marginal endplate osteophytic spurring. Postoperative changes from prior wide posterior decompression. Enhancing granulation tissue present within the epidural space bilaterally. Small nonenhancing residual and/or  recurrent left subarticular to foraminal disc protrusion is seen (series 9, image 32). Disc material closely approximates both the exiting left L4 and descending L5 nerve roots. Mild bilateral facet hypertrophy with trace joint effusion on the right. No significant canal or lateral recess stenosis. Mild to moderate bilateral L4 foraminal stenosis.   L5-S1: Trace retrolisthesis. Degenerative intervertebral disc space narrowing with disc desiccation. Associated reactive endplate spurring. Sequelae of prior left hemi laminectomy and partial facetectomy. Residual and/or recurrent broad-based nonenhancing left subarticular to foraminal disc protrusion (series 9, image 38). Disc material closely approximates both the exiting left L5 and descending S1 nerve roots. Residual mild left lateral recess stenosis. Central canal remains patent. Mild to moderate bilateral L5 foraminal stenosis.   IMPRESSION: 1. Postoperative changes from prior posterior decompression at L4-5. Small residual and/or recurrent left subarticular to foraminal disc protrusion at this level, closely approximating and potentially affecting either the exiting left L4 or descending L5 nerve roots. 2. Postoperative changes from prior left posterior decompression at L5-S1. Residual and/or recurrent broad-based left subarticular to foraminal disc protrusion at this level, closely approximating and potentially affecting either the exiting left L5 or descending S1 nerve roots.     Electronically Signed   By: Jeannine Boga M.D.   On: 09/26/2021 05:12     Objective:  VS:  HT:    WT:   BMI:     BP:129/85  HR:97bpm  TEMP: ( )  RESP:  Physical Exam Vitals and nursing note reviewed.  Constitutional:      General: She is not in acute distress.    Appearance:  Normal appearance. She is not ill-appearing.  HENT:     Head: Normocephalic and atraumatic.     Right Ear: External ear normal.     Left Ear: External ear  normal.  Eyes:     Extraocular Movements: Extraocular movements intact.  Cardiovascular:     Rate and Rhythm: Normal rate.     Pulses: Normal pulses.  Pulmonary:     Effort: Pulmonary effort is normal. No respiratory distress.  Abdominal:     General: There is no distension.     Palpations: Abdomen is soft.  Musculoskeletal:        General: Tenderness present.     Cervical back: Neck supple.     Right lower leg: No edema.     Left lower leg: No edema.     Comments: Patient has good distal strength with no pain over the greater trochanters.  No clonus or focal weakness.  Skin:    Findings: No erythema, lesion or rash.  Neurological:     General: No focal deficit present.     Mental Status: She is alert and oriented to person, place, and time.     Sensory: No sensory deficit.     Motor: No weakness or abnormal muscle tone.     Coordination: Coordination normal.  Psychiatric:        Mood and Affect: Mood normal.        Behavior: Behavior normal.      Imaging: No results found.

## 2022-06-06 ENCOUNTER — Ambulatory Visit: Admission: RE | Admit: 2022-06-06 | Payer: Medicaid Other | Source: Ambulatory Visit

## 2022-06-21 ENCOUNTER — Ambulatory Visit
Admission: RE | Admit: 2022-06-21 | Discharge: 2022-06-21 | Disposition: A | Discharge status: Home / Self Care | Payer: Medicaid Other | Source: Ambulatory Visit | Attending: Obstetrics & Gynecology | Admitting: Obstetrics & Gynecology

## 2022-06-21 DIAGNOSIS — N921 Excessive and frequent menstruation with irregular cycle: Secondary | ICD-10-CM | POA: Insufficient documentation

## 2022-06-21 DIAGNOSIS — Z01419 Encounter for gynecological examination (general) (routine) without abnormal findings: Secondary | ICD-10-CM | POA: Insufficient documentation

## 2022-06-27 ENCOUNTER — Ambulatory Visit (INDEPENDENT_AMBULATORY_CARE_PROVIDER_SITE_OTHER): Payer: Medicaid Other | Admitting: Orthopedic Surgery

## 2022-06-27 DIAGNOSIS — M5416 Radiculopathy, lumbar region: Secondary | ICD-10-CM

## 2022-06-27 NOTE — Progress Notes (Signed)
Orthopedic Spine Surgery Office Note  Assessment: Patient is a 42 y.o. female with low back pain that radiates into bilateral lower extremities.  More symptomatic on the left side.  Goes down the posterior aspect of the legs and into the thigh on the left side.  She has lateral recess stenosis and a disc herniation that goes into the foramen at L5-S1, she has foraminal stenosis at L4-5 on the left as well. I do not see anything on her MRI to explain her right sided symptoms.    Plan: -Patient has tried extensive conservative treatments without any lasting relief.  We have previously talked about surgery, but I told her the fact that she got worse with the injection makes me less confident that a surgery would provide her with significant benefit.  It would only expose her to potential for complication.  Given that, I recommend holding off on surgery at this time and continue with pain management.  I told her that she could seek a second opinion because she does have radiographic findings of compression on her MRI -Patient should return to office on an as-needed basis   Patient expressed understanding of the plan and all questions were answered to the patient's satisfaction.   ___________________________________________________________________________  History: Patient is a 42 y.o. female who has been previously seen in the office for symptoms low back pain radiating into bilateral lower extremities.  Pain is felt on a daily basis.  She is already in pain management and says that that helps somewhat.  After our last visit, I recommended a diagnostic and therapeutic L4 and L5 injection (transforaminal) on the left.  She states that the injection made her worse and it took a couple of weeks before she returned to her baseline level of pain.  She has not had any recent changes in her symptoms.  Previous treatments: PT, home exercises, over-the-counter pain medications, surgery, injections    Physical  Exam:  General: no acute distress, appears stated age Neurologic: alert, answering questions appropriately, following commands Respiratory: unlabored breathing on room air, symmetric chest rise Psychiatric: appropriate affect, normal cadence to speech   MSK (spine):   -Strength exam                                                   Left                  Right EHL                              3/5                  4/5 TA                                 4/5                  4/5 GSC                             5/5                  5/5 Knee extension  5/5                  5/5 Hip flexion                    5/5                  5/5   -Sensory exam                           Sensation intact to light touch in L3-S1 nerve distributions of bilateral lower extremities  Imaging: XR of the lumbar spine from 12/21/2021, 09/13/2021 was previously independently reviewed and interpreted, showing disc height loss at L4-5 and L5-S1.  There is sclerosis of the superior endplate of L5 and inferior endplate of L4.  Retrolisthesis of L3 on L4.  No evidence of instability.  PI = 54, LL = 50.   CT of the abdomen pelvis from 06/09/2021 shows near complete removal of the inferior facet of L5, laminotomy defect at L5-S1, subtle lucency in the pars on the left at L4-5 laminectomy defect at L4-5   MRI of the lumbar spine from 09/24/2021 was previously independently reviewed and interpreted, showing degenerative disc disease with Modic changes at L4-5, degenerative disc disease at L5-S1.  Prior surgical defects at L4-5 and L5-S1 as described above in the CT interpretation.  Left-sided foraminal stenosis at L4-5, lateral recess stenosis and left sided foraminal stenosis at L5-S1.  Large broad-based mostly left-sided disc herniation at L5-S1.   Patient name: Melinda Stone Patient MRN: [id number] Date of visit: 06/27/22

## 2022-07-10 NOTE — Progress Notes (Unsigned)
Patient: Melinda Stone Date of Birth: 1980/06/18  Reason for Visit: Follow up History from: Patient Primary Neurologist: Penumalli   ASSESSMENT AND PLAN 42 y.o. year old female   1.  Chronic migraine headache 2.  Chronic pain syndrome -Currently having 2-3 migraines a week -Start Emgality for migraine prevention with loading dose, followed 30 days later by maintenance dose -Try Imitrex 50 mg as needed for acute headache -Check urine pregnancy for Medicaid requirement prior to CGRP, is not planning pregnancy, had tubes tied, but still has menstrual cycle, pregnancy not advised until all CGRP for 6 months -Previously tried and failed: Amitriptyline, Topamax, Maxalt, gabapentin, Percocet, tizanidine -Next steps: Ubrelvy for rescue -For now, we will continue amitriptyline, it does help with her sleeping, would like to consider weaning off once established on CGRP -Follow-up in 6 months or sooner if needed  Meds ordered this encounter  Medications   Galcanezumab-gnlm (EMGALITY) 120 MG/ML SOAJ    Sig: Inject 240 mg into the skin once for 1 dose.    Dispense:  2 mL    Refill:  0    Please dispense 2 pens for loading dose   Galcanezumab-gnlm (EMGALITY) 120 MG/ML SOAJ    Sig: Inject 120 mg into the skin every 30 (thirty) days.    Dispense:  1.12 mL    Refill:  11    Fill this 2nd, this is loading dose   amitriptyline (ELAVIL) 25 MG tablet    Sig: Take 1 tablet (25 mg total) by mouth at bedtime.    Dispense:  30 tablet    Refill:  5   SUMAtriptan (IMITREX) 50 MG tablet    Sig: Take 1 tablet (50 mg total) by mouth every 2 (two) hours as needed for migraine. May repeat in 2 hours if headache persists or recurs.    Dispense:  10 tablet    Refill:  5    HISTORY OF PRESENT ILLNESS: Today 07/11/22 Had MRI of the brain with and without contrast October 2023 was unremarkable. Amitriptyline started last visit 25 mg at bedtime. No change in headaches, but it helps her sleep.  Still having 2-3 a week. Are frontal, occipital. Migraine features. Takes excedrin migraine and tylenol, doesn't help. Tried Maxalt in the past without benefit. Chronic back pain on oxycodone. Hospitalized for PNA in Feb 2024. Supposed to have fusion in lower back, reports orthopedics won't operate due to low iron levels, ESI caused more pain than help. Get a 2nd opinion, continue going to pain management. Trying for awhile to get disability, going through appeal.   HISTORY  UPDATE (06/20/21, VRP): Since last visit, now with migraine x 2 months. Similar HA in her 20's. Gen throbbing, nausea, sens to light and sound HA. Now 3-4 per week.   PRIOR HPI (11/07/20): 42 year old female with type 2 diabetes and chronic low back pain here for evaluation of lower extremity pain and numbness.   Patient has had diabetes since age 18 years old.  She has had lower extremity numbness and tingling for past 3 to 4 years.  She is also been diagnosed with lumbar spinal stenosis and degenerative changes, status post surgery in March 2022.  Follow-up postoperative imaging demonstrates residual spinal stenosis and radiculopathy changes and she is planning to have additional surgery in the next few weeks.   Patient has been under care of pain management in the past.  She has tried gabapentin, Lyrica, Cymbalta, Robaxin, hydrocodone and tramadol in the past  REVIEW OF  SYSTEMS: Out of a complete 14 system review of symptoms, the patient complains only of the following symptoms, and all other reviewed systems are negative.  See HPI  ALLERGIES: Allergies  Allergen Reactions   Trulicity [Dulaglutide] Diarrhea    HOME MEDICATIONS: Outpatient Medications Prior to Visit  Medication Sig Dispense Refill   amitriptyline (ELAVIL) 25 MG tablet TAKE 1 TABLET(25 MG) BY MOUTH AT BEDTIME (Patient taking differently: Take 25 mg by mouth at bedtime.) 30 tablet 3   cyanocobalamin (VITAMIN B12) 1000 MCG tablet Take 1 tablet (1,000 mcg  total) by mouth daily. 90 tablet 1   empagliflozin (JARDIANCE) 10 MG TABS tablet Take 1 tablet (10 mg total) by mouth daily before breakfast. 90 tablet 2   fluconazole (DIFLUCAN) 150 MG tablet Take 1 tablet (150 mg total) by mouth daily. 1 tablet 0   gabapentin (NEURONTIN) 800 MG tablet Take 800 mg by mouth in the morning, at noon, in the evening, and at bedtime.     glipiZIDE (GLUCOTROL) 10 MG tablet Take 2 tablets (20 mg total) by mouth daily before breakfast AND 1 tablet (10 mg total) daily before supper. (Patient taking differently: Take 20 mg by mouth twice daily. ) 270 tablet 3   guaiFENesin-dextromethorphan (ROBITUSSIN DM) 100-10 MG/5ML syrup Take 5 mLs by mouth every 4 (four) hours as needed for cough. 118 mL 0   metroNIDAZOLE (FLAGYL) 500 MG tablet Take 500 mg by mouth 2 (two) times daily.     ondansetron (ZOFRAN-ODT) 4 MG disintegrating tablet Take 1 tablet (4 mg total) by mouth 3 (three) times daily as needed for nausea or vomiting. 30 tablet 2   oxyCODONE-acetaminophen (PERCOCET/ROXICET) 5-325 MG tablet Take 1 tablet by mouth 4 (four) times daily as needed for moderate pain or severe pain.     pantoprazole (PROTONIX) 40 MG tablet Take 1 tablet (40 mg total) by mouth daily. 30 tablet 0   prednisoLONE acetate (PRED FORTE) 1 % ophthalmic suspension Place 1 drop into the left eye 4 (four) times daily.     tiZANidine (ZANAFLEX) 4 MG tablet Take 4 mg by mouth 2 (two) times daily.     vitamin C (ASCORBIC ACID) 250 MG tablet Take 2 tablets (500 mg total) by mouth daily. (Patient taking differently: Take 250 mg by mouth daily.) 180 tablet 0   No facility-administered medications prior to visit.    PAST MEDICAL HISTORY: Past Medical History:  Diagnosis Date   Anemia    Arthritis 08/17/2020   Chronic back pain    Diabetes mellitus without complication    type 2   GERD (gastroesophageal reflux disease)    Migraine    Ovarian cyst    Pneumonia    Polyneuropathy     PAST SURGICAL  HISTORY: Past Surgical History:  Procedure Laterality Date   CESAREAN SECTION     CHOLECYSTECTOMY  2004   LUMBAR LAMINECTOMY N/A 05/30/2020   Procedure: Lumbar five Laminectomy,  Bilateral Microdiscectomy, Left Lumbar five -Sacral one Microdiscectomy;  Surgeon: [name], MD;  Location: [facility] OR;  Service: Orthopedics;  Laterality: N/A;   LUMBAR LAMINECTOMY Left 11/14/2020   Procedure: LEFT LUMBAR FOUR-FIVE MICRODISCECTOMY;  Surgeon: [name], MD;  Location: [facility] OR;  Service: Orthopedics;  Laterality: Left;   TUBAL LIGATION  10/19/2010    FAMILY HISTORY: Family History  Problem Relation Age of Onset   Diabetes Mother    Hypertension Mother    Stroke Mother     SOCIAL HISTORY: Social History  Socioeconomic History   Marital status: Single    Spouse name: Not on file   Number of children: 3   Years of education: Not on file   Highest education level: High school graduate  Occupational History    Comment: home maker  Tobacco Use   Smoking status: Every Day    Types: Cigars    Last attempt to quit: 12/23/2021    Years since quitting: 0.5   Smokeless tobacco: Never   Tobacco comments:    Not smoking as of 12/23/21. Verified 01/15/22 LW  Vaping Use   Vaping Use: Never used  Substance and Sexual Activity   Alcohol use: Not Currently   Drug use: Not Currently    Comment: per patient stopped smoking marijuana Mar 2022   Sexual activity: Yes    Birth control/protection: Surgical  Other Topics Concern   Not on file  Social History Narrative   Lives with 3 children   Some coffee   Social Determinants of Health   Financial Resource Strain: Not on file  Food Insecurity: No Food Insecurity (05/13/2022)   Hunger Vital Sign    Worried About Running Out of Food in the Last Year: Never true    Ran Out of Food in the Last Year: Never true  Transportation Needs: No Transportation Needs (05/13/2022)   PRAPARE - Administrator, Civil Service (Medical): No    Lack of  Transportation (Non-Medical): No  Physical Activity: Not on file  Stress: Not on file  Social Connections: Not on file  Intimate Partner Violence: Not At Risk (05/13/2022)   Humiliation, Afraid, Rape, and Kick questionnaire    Fear of Current or Ex-Partner: No    Emotionally Abused: No    Physically Abused: No    Sexually Abused: No    PHYSICAL EXAM  Vitals:   07/11/22 0905  BP: 102/66  Pulse: (!) 109  Weight: 202 lb 8 oz (91.9 kg)  Height: 5\' 9"  (1.753 m)   Body mass index is 29.9 kg/m.  Generalized: Well developed, in no acute distress, disheveled, tired  Neurological examination  Mentation: Alert oriented to time, place, history taking. Follows all commands speech and language fluent Cranial nerve II-XII: Pupils were equal round reactive to light. Extraocular movements were full, visual field were full on confrontational test. Facial sensation and strength were normal.  Head turning and shoulder shrug  were normal and symmetric. Motor: The motor testing reveals 5 over 5 strength of all extremities, exception 4/5 left hip flexion Sensory: Sensory testing is intact to soft touch on all 4 extremities. No evidence of extinction is noted.  Coordination: Cerebellar testing reveals good finger-nose-finger and heel-to-shin bilaterally.  Gait and station: Gait is wide-based, cautious, wearing back brace Reflexes: Deep tendon reflexes are symmetric but decreased  DIAGNOSTIC DATA (LABS, IMAGING, TESTING) - I reviewed patient records, labs, notes, testing and imaging myself where available.  Lab Results  Component Value Date   WBC 7.2 05/30/2022   HGB 11.5 05/30/2022   HCT 36.2 05/30/2022   MCV 83 05/30/2022   PLT 189 05/30/2022      Component Value Date/Time   NA 137 05/15/2022 0439   NA 136 04/28/2020 1124   K 4.0 05/15/2022 0439   CL 109 05/15/2022 0439   CO2 22 05/15/2022 0439   GLUCOSE 283 (H) 05/15/2022 0439   BUN 5 (L) 05/15/2022 0439   BUN 4 (L) 04/28/2020 1124    CREATININE 0.74 05/15/2022 0439   CALCIUM  8.6 (L) 05/15/2022 0439   PROT 7.1 05/18/2022 0946   PROT 6.3 04/28/2020 1124   ALBUMIN 3.6 05/18/2022 0946   ALBUMIN 4.0 04/28/2020 1124   AST 15 05/18/2022 0946   ALT 15 05/18/2022 0946   ALKPHOS 100 05/18/2022 0946   BILITOT 1.0 05/18/2022 0946   BILITOT 1.7 (H) 04/28/2020 1124   GFRNONAA >60 05/15/2022 0439   GFRAA 139 04/28/2020 1124   Lab Results  Component Value Date   CHOL 96 (L) 04/28/2020   HDL 55 04/28/2020   LDLCALC 23 04/28/2020   TRIG 97 04/28/2020   CHOLHDL 1.7 04/28/2020   Lab Results  Component Value Date   HGBA1C 5.6 04/16/2022   Lab Results  Component Value Date   VITAMINB12 390 04/06/2022   Lab Results  Component Value Date   TSH 0.657 09/11/2021    Margie Ege, AGNP-C, DNP 07/11/2022, 9:09 AM [facility] [address] [place], [place] [address] [phone]

## 2022-07-11 ENCOUNTER — Encounter: Payer: Self-pay | Admitting: Neurology

## 2022-07-11 ENCOUNTER — Ambulatory Visit: Payer: Medicaid Other | Admitting: Neurology

## 2022-07-11 VITALS — BP 102/66 | HR 109 | Ht 69.0 in | Wt 202.5 lb

## 2022-07-11 DIAGNOSIS — G43109 Migraine with aura, not intractable, without status migrainosus: Secondary | ICD-10-CM | POA: Diagnosis not present

## 2022-07-11 DIAGNOSIS — G894 Chronic pain syndrome: Secondary | ICD-10-CM | POA: Diagnosis not present

## 2022-07-11 DIAGNOSIS — G43909 Migraine, unspecified, not intractable, without status migrainosus: Secondary | ICD-10-CM | POA: Insufficient documentation

## 2022-07-11 MED ORDER — EMGALITY 120 MG/ML ~~LOC~~ SOAJ: 240.0000 mg | Freq: Once | SUBCUTANEOUS | 0 refills | Status: AC

## 2022-07-11 MED ORDER — EMGALITY 120 MG/ML ~~LOC~~ SOAJ: 120.0000 mg | SUBCUTANEOUS | 11 refills | Status: DC

## 2022-07-11 MED ORDER — SUMATRIPTAN SUCCINATE 50 MG PO TABS: 50.0000 mg | ORAL_TABLET | ORAL | 5 refills | Status: DC | PRN

## 2022-07-11 MED ORDER — AMITRIPTYLINE HCL 25 MG PO TABS: 25.0000 mg | ORAL_TABLET | Freq: Every day | ORAL | 5 refills | Status: DC

## 2022-07-11 NOTE — Patient Instructions (Signed)
Start the [facility] for migraine prevention, the 1st month will be 2 injections for loading dose, followed 30 days later by 1 injection monthly  Try Imitrex at onset of headache, may repeat in 2 hours if needed  Consider weaning amitriptyline at next visit   See you back in 6 months

## 2022-07-12 LAB — PREGNANCY, URINE: Preg Test, Ur: NEGATIVE

## 2022-07-18 ENCOUNTER — Ambulatory Visit
Admission: RE | Admit: 2022-07-18 | Discharge: 2022-07-18 | Disposition: A | Discharge status: Home / Self Care | Payer: Medicaid Other | Source: Ambulatory Visit | Attending: Obstetrics & Gynecology | Admitting: Obstetrics & Gynecology

## 2022-07-18 DIAGNOSIS — Z01419 Encounter for gynecological examination (general) (routine) without abnormal findings: Secondary | ICD-10-CM

## 2022-07-21 ENCOUNTER — Emergency Department (HOSPITAL_COMMUNITY)
Admission: EM | Admit: 2022-07-21 | Discharge: 2022-07-21 | Disposition: A | Discharge status: Home / Self Care | Payer: Medicaid Other | Attending: Emergency Medicine | Admitting: Emergency Medicine

## 2022-07-21 ENCOUNTER — Other Ambulatory Visit: Payer: Self-pay

## 2022-07-21 ENCOUNTER — Encounter (HOSPITAL_COMMUNITY): Payer: Self-pay | Admitting: Emergency Medicine

## 2022-07-21 DIAGNOSIS — M5416 Radiculopathy, lumbar region: Secondary | ICD-10-CM

## 2022-07-21 DIAGNOSIS — M5432 Sciatica, left side: Secondary | ICD-10-CM | POA: Diagnosis present

## 2022-07-21 MED ORDER — KETOROLAC TROMETHAMINE 30 MG/ML IJ SOLN
30.0000 mg | Freq: Once | INTRAMUSCULAR | Status: AC
  Administered 2022-07-21: 30 mg via INTRAMUSCULAR
  Filled 2022-07-21: qty 1

## 2022-07-21 MED ORDER — PREDNISONE 20 MG PO TABS: 40.0000 mg | ORAL_TABLET | Freq: Every day | ORAL | 0 refills | Status: DC

## 2022-07-21 NOTE — ED Triage Notes (Signed)
Pt in with L leg/sciatica pain that flared 3 wks ago, but worsened tonight. +tingling in LLE, no loss of b&b control. Has known back injuries, is wearing lower back brace.

## 2022-07-21 NOTE — ED Provider Notes (Signed)
[facility]-EMERGENCY DEPT [facility] Emergency Department Provider Note MRN:  [id number]  Arrival date & time: 07/21/22     Chief Complaint   Leg Pain   History of Present Illness   Melinda Stone is a 42 y.o. year-old female presents to the ED with chief complaint of bilateral sciatica, left worse than right tonight.  She states that she has been having the symptoms for the past couple weeks.  She was seen by orthopedics a few weeks ago and was advised against surgery because injections did not help her.  She has been wearing a back brace.  She does have diabetes.  She reports burning and tingling sensation in the left leg.  She has been trying home stretches.  History provided by patient.   Review of Systems  Pertinent positive and negative review of systems noted in HPI.    Physical Exam   Vitals:   07/21/22 0028  BP: 117/73  Pulse: 99  Resp: 16  Temp: 97.7 F (36.5 C)  SpO2: 100%    CONSTITUTIONAL:  non toxic-appearing, NAD NEURO:  Alert and oriented x 3, CN 3-12 grossly intact EYES:  eyes equal and reactive ENT/NECK:  Supple, no stridor  CARDIO:  normal rate, regular rhythm, appears well-perfused, normal distal pulses  PULM:  No respiratory distress GI/GU:  non-distended,  MSK/SPINE:  No gross deformities, no edema, moves all extremities, normal great toe extension  SKIN:  no rash, atraumatic   *Additional and/or pertinent findings included in MDM below  Diagnostic and Interventional Summary    EKG Interpretation  Date/Time:    Ventricular Rate:    PR Interval:    QRS Duration:   QT Interval:    QTC Calculation:   R Axis:     Text Interpretation:         Labs Reviewed - No data to display  No orders to display    Medications  ketorolac (TORADOL) 30 MG/ML injection 30 mg (has no administration in time range)     Procedures  /  Critical Care Procedures  ED Course and Medical Decision Making  I have reviewed the triage vital signs, the  nursing notes, and pertinent available records from the EMR.  Social Determinants Affecting Complexity of Care: Patient has no clinically significant social determinants affecting this chief complaint..   ED Course:    Medical Decision Making Patient with back pain.    No neurological deficits and normal neuro exam.  Patient is ambulatory.  No loss of bowel or bladder control.  Doubt cauda equina.  Denies fever,  doubt epidural abscess or other lesion. Recommend back exercises, stretching, RICE, and will treat with a short course of prednisone.  Urged careful monitoring of blood sugar.  Consultants: none  Review of prior charts show recent visit with ortho advising against surgical intervention.  Encouraged the patient that there could be a need for additional workup and/or imaging such as MRI, if the symptoms do not resolve. Patient advised that if the back pain does not resolve, or radiates, this could progress to more serious conditions and is encouraged to follow-up with PCP or orthopedics within 2 weeks.     Risk Prescription drug management.     Consultants: No consultations were needed in caring for this patient.   Treatment and Plan: Emergency department workup does not suggest an emergent condition requiring admission or immediate intervention beyond  what has been performed at this time. The patient is safe for discharge and has  been instructed to return immediately for worsening symptoms, change in  symptoms or any other concerns    Final Clinical Impressions(s) / ED Diagnoses     ICD-10-CM   1. Lumbar radiculopathy  M54.16       ED Discharge Orders          Ordered    predniSONE (DELTASONE) 20 MG tablet  Daily        07/21/22 0220              Discharge Instructions Discussed with and Provided to Patient:   Discharge Instructions   None      Roxy Horseman, PA-C 07/21/22 0223    Sabas Sous, MD 07/21/22 [phone]

## 2022-07-27 ENCOUNTER — Other Ambulatory Visit: Payer: Self-pay | Admitting: Internal Medicine

## 2022-07-27 DIAGNOSIS — E1142 Type 2 diabetes mellitus with diabetic polyneuropathy: Secondary | ICD-10-CM

## 2022-08-02 ENCOUNTER — Emergency Department (HOSPITAL_COMMUNITY)
Admission: EM | Admit: 2022-08-02 | Discharge: 2022-08-03 | Disposition: A | Discharge status: Home / Self Care | Payer: Medicaid Other | Attending: Emergency Medicine | Admitting: Emergency Medicine

## 2022-08-02 ENCOUNTER — Other Ambulatory Visit: Payer: Self-pay

## 2022-08-02 DIAGNOSIS — E119 Type 2 diabetes mellitus without complications: Secondary | ICD-10-CM | POA: Diagnosis not present

## 2022-08-02 DIAGNOSIS — J4520 Mild intermittent asthma, uncomplicated: Secondary | ICD-10-CM | POA: Insufficient documentation

## 2022-08-02 DIAGNOSIS — M5432 Sciatica, left side: Secondary | ICD-10-CM

## 2022-08-02 DIAGNOSIS — Z7984 Long term (current) use of oral hypoglycemic drugs: Secondary | ICD-10-CM | POA: Insufficient documentation

## 2022-08-02 DIAGNOSIS — M5442 Lumbago with sciatica, left side: Secondary | ICD-10-CM | POA: Diagnosis not present

## 2022-08-02 DIAGNOSIS — M79605 Pain in left leg: Secondary | ICD-10-CM | POA: Diagnosis present

## 2022-08-02 DIAGNOSIS — L02214 Cutaneous abscess of groin: Secondary | ICD-10-CM | POA: Diagnosis not present

## 2022-08-02 MED ORDER — HYDROMORPHONE HCL 1 MG/ML IJ SOLN
1.0000 mg | Freq: Once | INTRAMUSCULAR | Status: AC
  Administered 2022-08-03: 1 mg via INTRAMUSCULAR
  Filled 2022-08-02: qty 1

## 2022-08-02 MED ORDER — KETOROLAC TROMETHAMINE 60 MG/2ML IM SOLN
30.0000 mg | Freq: Once | INTRAMUSCULAR | Status: AC
  Administered 2022-08-03: 30 mg via INTRAMUSCULAR
  Filled 2022-08-02: qty 2

## 2022-08-02 NOTE — ED Provider Notes (Incomplete)
[place] [facility] DE[facility]MENT AT [facility] Provider Note  CSN: [id number] Arrival date & time: 08/02/22 2242  Chief Complaint(s) Leg Pain and Ingrown Hair  HPI Melinda Stone is a 42 y.o. female with a past medical history listed below including lumbar radiculopathy with left-sided sciatica followed by Dr. Willia Craze here for flare of her sciatica over the past month.  No acute changes.  Patient is taking Percocet, muscle relaxers and steroids that provided minimal relief.  She states that she is doing her stretches and exercises.  She denies any fall or trauma.  Denies any bladder/bowel incontinence.  No lower extremity weakness or loss of sensation.  Mostly patient is here for ingrown hair in her groin.  Patient noted this 2 days ago and states that it is painful to touch.  It is not draining.  No other physical complaints.  The history is provided by the patient.    Past Medical History Past Medical History:  Diagnosis Date  . Anemia   . Arthritis 08/17/2020  . Chronic back pain   . Diabetes mellitus without complication (HCC)    type 2  . GERD (gastroesophageal reflux disease)   . Migraine   . Ovarian cyst   . Pneumonia   . Polyneuropathy    Patient Active Problem List   Diagnosis Date Noted  . Migraine 07/11/2022  . Hypokalemia 05/14/2022  . Normocytic anemia 05/14/2022  . Pneumonitis 05/13/2022  . Grade I diastolic dysfunction 05/13/2022  . Acute pharyngitis 05/13/2022  . Carpal tunnel syndrome 05/13/2022  . Pain of breast 05/13/2022  . Contusion of upper limb 05/13/2022  . Obesity 05/13/2022  . Overweight (BMI 25.0-29.9) 05/13/2022  . Aspirat pneumonitis due to anesth during preg, first tri 05/05/2022  . Need for prophylactic vaccination and inoculation against influenza 02/07/2022  . Low serum vitamin B12 01/30/2022  . IDA (iron deficiency anemia) 01/30/2022  . Mild intermittent asthma 01/15/2022  . Esophageal reflux 01/15/2022  .  Dyspnea 09/01/2021  . Pulmonary nodules 09/01/2021  . Absolute anemia 09/01/2021  . Tobacco use 09/01/2021  . Hypotension 09/01/2021  . Nausea & vomiting 06/08/2021  . UTI (urinary tract infection) 06/08/2021  . Post laminectomy syndrome 04/11/2021  . HNP (herniated nucleus pulposus), lumbar 11/14/2020  . 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 Chronic pain syndrome 06/14/2020  . Pharmacologic therapy 06/14/2020  . Disorder of skeletal system 06/14/2020  . Problems influencing health status 06/14/2020  . History of marijuana use 06/14/2020  . History of illicit drug use 06/14/2020  . Abnormal drug screen (05/25/2020) 06/14/2020  . Marijuana use 06/14/2020  . Abnormal MRI, lumbar spine (05/16/2020) 06/14/2020  . 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 Lumbar lateral recess stenosis (Left: L4-5, L5-S1) 06/14/2020  . 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 S/P lumbar microdiscectomy 06/07/2020  . 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  . GI bleed 12/01/2019  . Muscle spasm 11/06/2019  . Sciatica 11/06/2019  . Low back pain 02/04/2019  . Type 2 diabetes mellitus with diabetic polyneuropathy, without long-term current use of insulin (HCC) 12/03/2018  . Type 2 diabetes mellitus with hyperglycemia, without long-term current use of insulin (HCC) 05/15/2018  . Acute bronchitis 12/01/2014   Home Medication(s) Prior to Admission medications   Medication Sig Start Date End Date Taking? Authorizing Provider  amitriptyline (ELAVIL) 25 MG tablet Take 1 tablet (25 mg total) by mouth at bedtime. 07/11/22   Glean Salvo, NP  cyanocobalamin (VITAMIN B12) 1000 MCG tablet Take 1 tablet (1,000 mcg total) by mouth daily. 04/10/22   Rickard Patience, MD  empagliflozin (JARDIANCE) 10 MG TABS tablet Take 1 tablet (10 mg total) by mouth daily before breakfast. 04/16/22   Shamleffer, Konrad Dolores, MD  fluconazole (DIFLUCAN) 150 MG tablet  Take 1 tablet (150 mg total) by mouth daily. 05/15/22   Danford, Earl Lites, MD  gabapentin (NEURONTIN) 800 MG tablet Take 800 mg by mouth in the morning, at noon, in the evening, and at bedtime. 04/04/21   [provider]  Galcanezumab-gnlm (EMGALITY) 120 MG/ML SOAJ Inject 120 mg into the skin every 30 (thirty) days. 07/11/22   Glean Salvo, NP  glipiZIDE (GLUCOTROL) 10 MG tablet TAKE 2 TABLETS BY MOUTH DAILY BEFORE BREAKFAST AND 1 DAILY BEFORE SUPPER 07/27/22   Shamleffer, Konrad Dolores, MD  guaiFENesin-dextromethorphan (ROBITUSSIN DM) 100-10 MG/5ML syrup Take 5 mLs by mouth every 4 (four) hours as needed for cough. 05/07/22   Rhetta Mura, MD  metroNIDAZOLE (FLAGYL) 500 MG tablet Take 500 mg by mouth 2 (two) times daily. 05/23/22   [provider]  ondansetron (ZOFRAN-ODT) 4 MG disintegrating tablet Take 1 tablet (4 mg total) by mouth 3 (three) times daily as needed for nausea or vomiting. 05/15/22   Danford, Earl Lites, MD  oxyCODONE-acetaminophen (PERCOCET/ROXICET) 5-325 MG tablet Take 1 tablet by mouth 4 (four) times daily as needed for moderate pain or severe pain. 03/13/21   [provider]  pantoprazole (PROTONIX) 40 MG tablet Take 1 tablet (40 mg total) by mouth daily. 06/12/21   Alwyn Ren, MD  prednisoLONE acetate (PRED FORTE) 1 % ophthalmic suspension Place 1 drop into the left eye 4 (four) times daily. 04/26/22   [provider]  predniSONE (DELTASONE) 20 MG tablet Take 2 tablets (40 mg total) by mouth daily. 07/21/22   Roxy Horseman, PA-C  SUMAtriptan (IMITREX) 50 MG tablet Take 1 tablet (50 mg total) by mouth every 2 (two) hours as needed for migraine. May repeat in 2 hours if headache persists or recurs. 07/11/22   Glean Salvo, NP  tiZANidine (ZANAFLEX) 4 MG tablet Take 4 mg by mouth 2 (two) times daily. 04/26/22   [provider]  vitamin C (ASCORBIC ACID) 250 MG tablet Take 2 tablets (500 mg total) by mouth daily. Patient  taking differently: Take 250 mg by mouth daily. 04/10/22   Rickard Patience, MD                                                                                                                                    Allergies Trulicity [dulaglutide]  Review of Systems Review of Systems As noted in HPI  Physical Exam Vital Signs  I have reviewed the triage vital signs LMP 07/03/2022 (Exact Date)  *** Physical Exam Vitals reviewed. Exam conducted with a chaperone present.  Constitutional:      General: She is  not in acute distress.    Appearance: She is well-developed. She is not diaphoretic.  HENT:     Head: Normocephalic and atraumatic.     Right Ear: External ear normal.     Left Ear: External ear normal.     Nose: Nose normal.  Eyes:     General: No scleral icterus.    Conjunctiva/sclera: Conjunctivae normal.  Neck:     Trachea: Phonation normal.  Cardiovascular:     Rate and Rhythm: Normal rate and regular rhythm.  Pulmonary:     Effort: Pulmonary effort is normal. No respiratory distress.     Breath sounds: No stridor.  Abdominal:     General: There is no distension.    Musculoskeletal:        General: Normal range of motion.     Cervical back: Normal range of motion.  Neurological:     Mental Status: She is alert and oriented to person, place, and time.  Psychiatric:        Behavior: Behavior normal.     ED Results and Treatments Labs (all labs ordered are listed, but only abnormal results are displayed) Labs Reviewed - No data to display                                                                                                                       EKG  EKG Interpretation  Date/Time:    Ventricular Rate:    PR Interval:    QRS Duration:   QT Interval:    QTC Calculation:   R Axis:     Text Interpretation:         Radiology No results found.  Medications Ordered in ED Medications  ketorolac (TORADOL) injection 30 mg (has no administration in time  range)  HYDROmorphone (DILAUDID) injection 1 mg (has no administration in time range)                                                                                                                                     Procedures Procedures  (including critical care time)  Medical Decision Making / ED Course  Click here for ABCD2, HEART and other calculators  Medical Decision Making    This patient presents to the ED for: Leg pain Ingrown hair   Key initial findings: Neuro exam intact   Co-morbidities/SDOH that complicate the patient evaluation/care: H/o DM  Presentation involves  an extensive number of treatment options, and is a complaint that carries with it a high risk of complications and morbidity. The differential diagnosis includes but not limited to:  Leg pain Consistent with sciatica (possibly combination of radiculopathy with muscles strain/spasm). Exam not concerning for cauda equina. Doubt infectious process. No sign concerning for DVT or arterial occlusion.  Ingrown hair   Hospitalization considered:  ***  Initial intervention:  ***   Work up Interpretation and Management:  Cardiac Monitoring/EKG: ***  Laboratory Tests ordered listed below with my independent interpretation:     Imaging Studies ordered listed below with my independent interpretation:   ED Course:       Final Clinical Impression(s) / ED Diagnoses Final diagnoses:  None    {Document critical care time when appropriate:1}  {Document review of labs and clinical decision tools ie heart score, Chads2Vasc2 etc:1}  {Document your independent review of radiology images, and any outside records:1} {Document your discussion with family members, caretakers, and with consultants:1} {Document social determinants of health affecting pt's care:1} {Document your decision making why or why not admission, treatments were needed:1} This chart was dictated using voice recognition software.   Despite best efforts to proofread,  errors can occur which can change the documentation meaning.

## 2022-08-02 NOTE — ED Provider Notes (Signed)
[place] EMERGENCY DEPARTMENT AT [facility] Provider Note  CSN: [id number] Arrival date & time: 08/02/22 2242  Chief Complaint(s) Leg Pain and Ingrown Hair  HPI Melinda Stone is a 42 y.o. female with a past medical history listed below including lumbar radiculopathy with left-sided sciatica followed by Dr. Willia Craze here for flare of her sciatica over the past month.  No acute changes.  Patient is taking Percocet, muscle relaxers and steroids that provided minimal relief.  She states that she is doing her stretches and exercises.  She denies any fall or trauma.  Denies any bladder/bowel incontinence.  No lower extremity weakness or loss of sensation.  Mostly patient is here for ingrown hair in her groin.  Patient noted this 2 days ago and states that it is painful to touch.  It is not draining.  No other physical complaints.  The history is provided by the patient.    Past Medical History Past Medical History:  Diagnosis Date   Anemia    Arthritis 08/17/2020   Chronic back pain    Diabetes mellitus without complication (HCC)    type 2   GERD (gastroesophageal reflux disease)    Migraine    Ovarian cyst    Pneumonia    Polyneuropathy    Patient Active Problem List   Diagnosis Date Noted   Migraine 07/11/2022   Hypokalemia 05/14/2022   Normocytic anemia 05/14/2022   Pneumonitis 05/13/2022   Grade I diastolic dysfunction 05/13/2022   Acute pharyngitis 05/13/2022   Carpal tunnel syndrome 05/13/2022   Pain of breast 05/13/2022   Contusion of upper limb 05/13/2022   Obesity 05/13/2022   Overweight (BMI 25.0-29.9) 05/13/2022   Aspirat pneumonitis due to anesth during preg, first tri 05/05/2022   Need for prophylactic vaccination and inoculation against influenza 02/07/2022   Low serum vitamin B12 01/30/2022   IDA (iron deficiency anemia) 01/30/2022   Mild intermittent asthma 01/15/2022   Esophageal reflux 01/15/2022   Dyspnea 09/01/2021    Pulmonary nodules 09/01/2021   Absolute anemia 09/01/2021   Tobacco use 09/01/2021   Hypotension 09/01/2021   Nausea & vomiting 06/08/2021   UTI (urinary tract infection) 06/08/2021   Post laminectomy syndrome 04/11/2021   HNP (herniated nucleus pulposus), lumbar 11/14/2020   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   Chronic pain syndrome 06/14/2020   Pharmacologic therapy 06/14/2020   Disorder of skeletal system 06/14/2020   Problems influencing health status 06/14/2020   History of marijuana use 06/14/2020   History of illicit drug use 06/14/2020   Abnormal drug screen (05/25/2020) 06/14/2020   Marijuana use 06/14/2020   Abnormal MRI, lumbar spine (05/16/2020) 06/14/2020   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, L5-S1) 06/14/2020   Lumbar lateral recess stenosis (Left: L4-5, L5-S1) 06/14/2020   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) 06/14/2020   S/P lumbar microdiscectomy 06/07/2020   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   GI bleed 12/01/2019   Muscle spasm 11/06/2019   Sciatica 11/06/2019   Low back pain 02/04/2019   Type 2 diabetes mellitus with diabetic polyneuropathy, without long-term current use of insulin (HCC) 12/03/2018   Type 2 diabetes mellitus with hyperglycemia, without long-term current use of insulin (HCC) 05/15/2018   Acute bronchitis 12/01/2014   Home Medication(s) Prior to Admission medications   Medication Sig Start Date End Date Taking? Authorizing Provider  doxycycline (VIBRAMYCIN) 100 MG capsule Take 1 capsule (100 mg total) by mouth 2 (two) times daily for 7 days. 08/04/22 08/11/22 Yes  Nira Conn, MD  amitriptyline (ELAVIL) 25 MG tablet Take 1 tablet (25 mg total) by mouth at bedtime. 07/11/22   Glean Salvo, NP  cyanocobalamin (VITAMIN B12) 1000 MCG tablet Take 1 tablet (1,000 mcg total) by mouth daily. 04/10/22   Rickard Patience, MD  empagliflozin (JARDIANCE) 10 MG TABS tablet Take 1 tablet (10 mg total) by  mouth daily before breakfast. 04/16/22   Shamleffer, Konrad Dolores, MD  fluconazole (DIFLUCAN) 150 MG tablet Take 1 tablet (150 mg total) by mouth daily. 05/15/22   Danford, Earl Lites, MD  gabapentin (NEURONTIN) 800 MG tablet Take 800 mg by mouth in the morning, at noon, in the evening, and at bedtime. 04/04/21   [provider]  Galcanezumab-gnlm (EMGALITY) 120 MG/ML SOAJ Inject 120 mg into the skin every 30 (thirty) days. 07/11/22   Glean Salvo, NP  glipiZIDE (GLUCOTROL) 10 MG tablet TAKE 2 TABLETS BY MOUTH DAILY BEFORE BREAKFAST AND 1 DAILY BEFORE SUPPER 07/27/22   Shamleffer, Konrad Dolores, MD  guaiFENesin-dextromethorphan (ROBITUSSIN DM) 100-10 MG/5ML syrup Take 5 mLs by mouth every 4 (four) hours as needed for cough. 05/07/22   Rhetta Mura, MD  metroNIDAZOLE (FLAGYL) 500 MG tablet Take 500 mg by mouth 2 (two) times daily. 05/23/22   [provider]  ondansetron (ZOFRAN-ODT) 4 MG disintegrating tablet Take 1 tablet (4 mg total) by mouth 3 (three) times daily as needed for nausea or vomiting. 05/15/22   Danford, Earl Lites, MD  oxyCODONE-acetaminophen (PERCOCET/ROXICET) 5-325 MG tablet Take 1 tablet by mouth 4 (four) times daily as needed for moderate pain or severe pain. 03/13/21   [provider]  pantoprazole (PROTONIX) 40 MG tablet Take 1 tablet (40 mg total) by mouth daily. 06/12/21   Alwyn Ren, MD  prednisoLONE acetate (PRED FORTE) 1 % ophthalmic suspension Place 1 drop into the left eye 4 (four) times daily. 04/26/22   [provider]  predniSONE (DELTASONE) 20 MG tablet Take 2 tablets (40 mg total) by mouth daily. 07/21/22   Roxy Horseman, PA-C  SUMAtriptan (IMITREX) 50 MG tablet Take 1 tablet (50 mg total) by mouth every 2 (two) hours as needed for migraine. May repeat in 2 hours if headache persists or recurs. 07/11/22   Glean Salvo, NP  tiZANidine (ZANAFLEX) 4 MG tablet Take 4 mg by mouth 2 (two) times daily. 04/26/22   [provider]  vitamin C (ASCORBIC ACID) 250 MG tablet Take 2 tablets (500 mg total) by mouth daily. Patient taking differently: Take 250 mg by mouth daily. 04/10/22   Rickard Patience, MD                                                                                                                                    Allergies Trulicity [dulaglutide]  Review of Systems Review of Systems As noted in HPI  Physical Exam Vital Signs  I have reviewed the triage  vital signs BP 116/66   Pulse 99   Resp 18   LMP 07/03/2022 (Exact Date)   SpO2 100%   Physical Exam Vitals reviewed. Exam conducted with a chaperone present.  Constitutional:      General: She is not in acute distress.    Appearance: She is well-developed. She is not diaphoretic.  HENT:     Head: Normocephalic and atraumatic.     Right Ear: External ear normal.     Left Ear: External ear normal.     Nose: Nose normal.  Eyes:     General: No scleral icterus.    Conjunctiva/sclera: Conjunctivae normal.  Neck:     Trachea: Phonation normal.  Cardiovascular:     Rate and Rhythm: Normal rate and regular rhythm.  Pulmonary:     Effort: Pulmonary effort is normal. No respiratory distress.     Breath sounds: No stridor.  Abdominal:     General: There is no distension.    Musculoskeletal:        General: Normal range of motion.     Cervical back: Normal range of motion.     Lumbar back: Spasms and tenderness present. No bony tenderness.       Back:     Comments: Spine Exam: Strength: 5/5 throughout LE bilaterally  Sensation: Intact to light touch in proximal and distal LE bilaterally   Neurological:     Mental Status: She is alert and oriented to person, place, and time.  Psychiatric:        Behavior: Behavior normal.     ED Results and Treatments Labs (all labs ordered are listed, but only abnormal results are displayed) Labs Reviewed - No data to display                                                                                                                        EKG  EKG Interpretation  Date/Time:    Ventricular Rate:    PR Interval:    QRS Duration:   QT Interval:    QTC Calculation:   R Axis:     Text Interpretation:         Radiology No results found.  Medications Ordered in ED Medications  ketorolac (TORADOL) injection 30 mg (has no administration in time range)  HYDROmorphone (DILAUDID) injection 1 mg (has no administration in time range)  Procedures Procedures  (including critical care time)  Medical Decision Making / ED Course  Click here for ABCD2, HEART and other calculators  Medical Decision Making    This patient presents to the ED for: Leg pain Ingrown hair   Key initial findings: Neuro exam intact  Co-morbidities/SDOH that complicate the patient evaluation/care: H/o DM  Presentation involves an extensive number of treatment options, and is a complaint that carries with it a high risk of complications and morbidity. The differential diagnosis includes but not limited to:  Leg pain Consistent with sciatica (possibly combination of radiculopathy with muscles strain/spasm). Exam not concerning for cauda equina. Doubt infectious process. No sign concerning for DVT or arterial occlusion.  Ingrown hair Consistent with this. Small pustule/abscess.  Hospitalization considered:  no  Initial intervention:  IM toradol and dilaudid   ED Course:  Patient was like pain is consistent with exacerbation of her sciatica.  No need for emergent imaging at this time.  Some improvement with above treatment.  Recommended close follow-up with her spine/pain doctors.  Small abscess.  No need for I&D at this time.  Recommended warm sitz bath's and compresses.  Will prescribe deferred antibiotics in 2 to 3 days if it continues to grow.       Final Clinical Impression(s) / ED Diagnoses Final diagnoses:  Sciatica of left side  Cutaneous abscess of groin   The patient appears reasonably screened and/or stabilized for discharge and I doubt any other medical condition or other [facility] requiring further screening, evaluation, or treatment in the ED at this time. I have discussed the findings, Dx and Tx plan with the patient/family who expressed understanding and agree(s) with the plan. Discharge instructions discussed at length. The patient/family was given strict return precautions who verbalized understanding of the instructions. No further questions at time of discharge.  Disposition: Discharge  Condition: Good  ED Discharge Orders          Ordered    doxycycline (VIBRAMYCIN) 100 MG capsule  2 times daily        08/03/22 0013             Follow Up: Anselm Jungling, NP [address] [address] [place] [address] [phone]  Call  to schedule an appointment for close follow up  London Sheer, MD [address] [place] [address] [phone]  Call  to schedule an appointment for close follow up           This chart was dictated using voice recognition software.  Despite best efforts to proofread,  errors can occur which can change the documentation meaning.    Nira Conn, MD 08/03/22 [phone]

## 2022-08-02 NOTE — ED Triage Notes (Signed)
Pt arrives c/o pain to L leg due to sciatica and an ingrown hair on perineum. States sciatica has been flaring up for about a month. Ingrown hair noted on Monday. States that she took tylenol, oxycodone and muscle relaxer around 1800 without relief. Denies new injury. Back brace in place. Denies fever/chills.

## 2022-08-03 MED ORDER — DOXYCYCLINE HYCLATE 100 MG PO CAPS: 100.0000 mg | ORAL_CAPSULE | Freq: Two times a day (BID) | ORAL | 0 refills | Status: AC

## 2022-08-04 ENCOUNTER — Other Ambulatory Visit: Payer: Self-pay

## 2022-08-04 ENCOUNTER — Encounter (HOSPITAL_COMMUNITY): Payer: Self-pay | Admitting: Emergency Medicine

## 2022-08-04 ENCOUNTER — Emergency Department (HOSPITAL_COMMUNITY)
Admission: EM | Admit: 2022-08-04 | Discharge: 2022-08-04 | Disposition: A | Discharge status: Home / Self Care | Payer: Medicaid Other | Attending: Emergency Medicine | Admitting: Emergency Medicine

## 2022-08-04 DIAGNOSIS — Z7984 Long term (current) use of oral hypoglycemic drugs: Secondary | ICD-10-CM | POA: Insufficient documentation

## 2022-08-04 DIAGNOSIS — Z23 Encounter for immunization: Secondary | ICD-10-CM | POA: Diagnosis not present

## 2022-08-04 DIAGNOSIS — L0291 Cutaneous abscess, unspecified: Secondary | ICD-10-CM

## 2022-08-04 DIAGNOSIS — E119 Type 2 diabetes mellitus without complications: Secondary | ICD-10-CM | POA: Diagnosis not present

## 2022-08-04 DIAGNOSIS — L02215 Cutaneous abscess of perineum: Secondary | ICD-10-CM | POA: Insufficient documentation

## 2022-08-04 DIAGNOSIS — L02214 Cutaneous abscess of groin: Secondary | ICD-10-CM | POA: Diagnosis present

## 2022-08-04 MED ORDER — TETANUS-DIPHTH-ACELL PERTUSSIS 5-2.5-18.5 LF-MCG/0.5 IM SUSY
0.5000 mL | PREFILLED_SYRINGE | Freq: Once | INTRAMUSCULAR | Status: AC
  Administered 2022-08-04: 0.5 mL via INTRAMUSCULAR
  Filled 2022-08-04: qty 0.5

## 2022-08-04 MED ORDER — LIDOCAINE-EPINEPHRINE 1 %-1:100000 IJ SOLN
10.0000 mL | Freq: Once | INTRAMUSCULAR | Status: AC
  Administered 2022-08-04: 10 mL
  Filled 2022-08-04: qty 1

## 2022-08-04 MED ORDER — OXYCODONE-ACETAMINOPHEN 5-325 MG PO TABS
1.0000 | ORAL_TABLET | Freq: Once | ORAL | Status: AC
  Administered 2022-08-04: 1 via ORAL
  Filled 2022-08-04: qty 1

## 2022-08-04 MED ORDER — DOXYCYCLINE HYCLATE 100 MG PO TABS
100.0000 mg | ORAL_TABLET | Freq: Once | ORAL | Status: AC
  Administered 2022-08-04: 100 mg via ORAL
  Filled 2022-08-04: qty 1

## 2022-08-04 NOTE — ED Provider Notes (Signed)
[facility] AT [facility] Provider Note   CSN: [id number] Arrival date & time: 08/04/22  4098     History  Chief Complaint  Patient presents with   Abscess    Melinda Stone is a 42 y.o. female.  The history is provided by the patient.  Abscess Melinda Stone is a 42 y.o. female who presents to the Emergency Department complaining of abscess.  She presents to the emergency department for evaluation of left groin abscess that started several days ago.  She was prescribed antibiotics but did not start taking them because she was told to start taking them on Saturday.  She presents today for evaluation due to the swelling and pain in her groin worsening in the area has now come to ahead.  No associated fevers, nausea, vomiting.  She has a history of diabetes and has had a prior abscess in the past.  She has a prescription for doxycycline and that she plans to fill later today.     Home Medications Prior to Admission medications   Medication Sig Start Date End Date Taking? Authorizing Provider  amitriptyline (ELAVIL) 25 MG tablet Take 1 tablet (25 mg total) by mouth at bedtime. 07/11/22   Glean Salvo, NP  cyanocobalamin (VITAMIN B12) 1000 MCG tablet Take 1 tablet (1,000 mcg total) by mouth daily. 04/10/22   Rickard Patience, MD  doxycycline (VIBRAMYCIN) 100 MG capsule Take 1 capsule (100 mg total) by mouth 2 (two) times daily for 7 days. 08/04/22 08/11/22  Nira Conn, MD  empagliflozin (JARDIANCE) 10 MG TABS tablet Take 1 tablet (10 mg total) by mouth daily before breakfast. 04/16/22   Shamleffer, Konrad Dolores, MD  fluconazole (DIFLUCAN) 150 MG tablet Take 1 tablet (150 mg total) by mouth daily. 05/15/22   Danford, Earl Lites, MD  gabapentin (NEURONTIN) 800 MG tablet Take 800 mg by mouth in the morning, at noon, in the evening, and at bedtime. 04/04/21   [provider]  Galcanezumab-gnlm (EMGALITY) 120 MG/ML SOAJ Inject 120 mg  into the skin every 30 (thirty) days. 07/11/22   Glean Salvo, NP  glipiZIDE (GLUCOTROL) 10 MG tablet TAKE 2 TABLETS BY MOUTH DAILY BEFORE BREAKFAST AND 1 DAILY BEFORE SUPPER 07/27/22   Shamleffer, Konrad Dolores, MD  guaiFENesin-dextromethorphan (ROBITUSSIN DM) 100-10 MG/5ML syrup Take 5 mLs by mouth every 4 (four) hours as needed for cough. 05/07/22   Rhetta Mura, MD  metroNIDAZOLE (FLAGYL) 500 MG tablet Take 500 mg by mouth 2 (two) times daily. 05/23/22   [provider]  ondansetron (ZOFRAN-ODT) 4 MG disintegrating tablet Take 1 tablet (4 mg total) by mouth 3 (three) times daily as needed for nausea or vomiting. 05/15/22   Danford, Earl Lites, MD  oxyCODONE-acetaminophen (PERCOCET/ROXICET) 5-325 MG tablet Take 1 tablet by mouth 4 (four) times daily as needed for moderate pain or severe pain. 03/13/21   [provider]  pantoprazole (PROTONIX) 40 MG tablet Take 1 tablet (40 mg total) by mouth daily. 06/12/21   Alwyn Ren, MD  prednisoLONE acetate (PRED FORTE) 1 % ophthalmic suspension Place 1 drop into the left eye 4 (four) times daily. 04/26/22   [provider]  predniSONE (DELTASONE) 20 MG tablet Take 2 tablets (40 mg total) by mouth daily. 07/21/22   Roxy Horseman, PA-C  SUMAtriptan (IMITREX) 50 MG tablet Take 1 tablet (50 mg total) by mouth every 2 (two) hours as needed for migraine. May repeat in 2 hours if headache persists  or recurs. 07/11/22   Glean Salvo, NP  tiZANidine (ZANAFLEX) 4 MG tablet Take 4 mg by mouth 2 (two) times daily. 04/26/22   [provider]  vitamin C (ASCORBIC ACID) 250 MG tablet Take 2 tablets (500 mg total) by mouth daily. Patient taking differently: Take 250 mg by mouth daily. 04/10/22   Rickard Patience, MD      Allergies    Trulicity [dulaglutide]    Review of Systems   Review of Systems  All other systems reviewed and are negative.   Physical Exam Updated Vital Signs BP 103/60   Pulse (!) 108   Temp 98.5 F  (36.9 C)   Resp 16   Ht 5\' 9"  (1.753 m)   Wt 90.3 kg   LMP 07/03/2022 (Exact Date)   SpO2 99%   BMI 29.39 kg/m  Physical Exam Vitals and nursing note reviewed.  Constitutional:      Appearance: She is well-developed.  HENT:     Head: Normocephalic and atraumatic.  Cardiovascular:     Rate and Rhythm: Normal rate and regular rhythm.  Pulmonary:     Effort: Pulmonary effort is normal. No respiratory distress.  Abdominal:     Palpations: Abdomen is soft.     Tenderness: There is no abdominal tenderness. There is no guarding or rebound.  Genitourinary:    Comments: There is erythema, swelling and tenderness over the left side of the mons with a central area of pointing purulent drainage.  Central area is approximately 2 x 3 cm with approximately 3 cm of surrounding erythema. Musculoskeletal:        General: No tenderness.  Skin:    General: Skin is warm and dry.  Neurological:     Mental Status: She is alert and oriented to person, place, and time.  Psychiatric:        Behavior: Behavior normal.     ED Results / Procedures / Treatments   Labs (all labs ordered are listed, but only abnormal results are displayed) Labs Reviewed - No data to display  EKG None  Radiology No results found.  Procedures .Marland KitchenIncision and Drainage  Date/Time: 08/04/2022 3:02 AM  Performed by: Tilden Fossa, MD Authorized by: Tilden Fossa, MD   Consent:    Consent obtained:  Verbal   Consent given by:  Patient   Risks discussed:  Bleeding, incomplete drainage, pain and infection Universal protocol:    Patient identity confirmed:  Verbally with patient Location:    Type:  Abscess   Location:  Anogenital   Anogenital location: [place]. Pre-procedure details:    Skin preparation:  Chlorhexidine Sedation:    Sedation type:  None Anesthesia:    Anesthesia method:  Local infiltration   Local anesthetic:  Lidocaine 2% WITH epi Procedure type:    Complexity:  Simple Procedure details:     Incision types:  Single straight   Wound management:  Probed and deloculated and irrigated with saline   Drainage:  Purulent and bloody   Drainage amount:  Moderate   Wound treatment:  Wound left open Post-procedure details:    Procedure completion:  Tolerated     Medications Ordered in ED Medications  doxycycline (VIBRA-TABS) tablet 100 mg (has no administration in time range)  Tdap (BOOSTRIX) injection 0.5 mL (0.5 mLs Intramuscular Given 08/04/22 0207)  lidocaine-EPINEPHrine (XYLOCAINE W/EPI) 1 %-1:100000 (with pres) injection 10 mL (10 mLs Infiltration Given 08/04/22 0207)  oxyCODONE-acetaminophen (PERCOCET/ROXICET) 5-325 MG per tablet 1 tablet (1 tablet Oral Given 08/04/22  [facility]ini)    ED Course/ Medical Decision Making/ A&P                             Medical Decision Making Risk Prescription drug management.   Patient with history of diabetes here for evaluation of left groin swelling and pain.  On examination she does have an abscess with surrounding cellulitis.  Current clinical picture is not consistent with necrotizing soft tissue infection, sepsis.  I&D performed per note.  Patient currently has a prescription for doxycycline that she plans to fill later today.  Will provide a one-time dose in the emergency department.  Feel she is stable for discharge home with outpatient follow-up and return precautions.        Final Clinical Impression(s) / ED Diagnoses Final diagnoses:  Abscess    Rx / DC Orders ED Discharge Orders     None         Tilden Fossa, MD 08/04/22 [phone]

## 2022-08-04 NOTE — ED Triage Notes (Signed)
Pt c/o recurrent abscess to groin area. Hx of the same, has had to get abscess drained.

## 2022-08-09 ENCOUNTER — Inpatient Hospital Stay: Payer: Medicaid Other | Attending: Oncology

## 2022-08-09 DIAGNOSIS — E1142 Type 2 diabetes mellitus with diabetic polyneuropathy: Secondary | ICD-10-CM | POA: Insufficient documentation

## 2022-08-09 DIAGNOSIS — E538 Deficiency of other specified B group vitamins: Secondary | ICD-10-CM

## 2022-08-09 DIAGNOSIS — F17290 Nicotine dependence, other tobacco product, uncomplicated: Secondary | ICD-10-CM | POA: Diagnosis not present

## 2022-08-09 DIAGNOSIS — D508 Other iron deficiency anemias: Secondary | ICD-10-CM

## 2022-08-09 DIAGNOSIS — D509 Iron deficiency anemia, unspecified: Secondary | ICD-10-CM | POA: Insufficient documentation

## 2022-08-09 LAB — CBC WITH DIFFERENTIAL/PLATELET
Abs Immature Granulocytes: 0.15 10*3/uL — ABNORMAL HIGH (ref 0.00–0.07)
Basophils Absolute: 0.1 10*3/uL (ref 0.0–0.1)
Basophils Relative: 1 %
Eosinophils Absolute: 0 10*3/uL (ref 0.0–0.5)
Eosinophils Relative: 0 %
HCT: 31.1 % — ABNORMAL LOW (ref 36.0–46.0)
Hemoglobin: 11 g/dL — ABNORMAL LOW (ref 12.0–15.0)
Immature Granulocytes: 1 %
Lymphocytes Relative: 7 %
Lymphs Abs: 0.8 10*3/uL (ref 0.7–4.0)
MCH: 26.1 pg (ref 26.0–34.0)
MCHC: 35.4 g/dL (ref 30.0–36.0)
MCV: 73.9 fL — ABNORMAL LOW (ref 80.0–100.0)
Monocytes Absolute: 0.2 10*3/uL (ref 0.1–1.0)
Monocytes Relative: 2 %
Neutro Abs: 11.1 10*3/uL — ABNORMAL HIGH (ref 1.7–7.7)
Neutrophils Relative %: 89 %
Platelets: 259 10*3/uL (ref 150–400)
RBC: 4.21 MIL/uL (ref 3.87–5.11)
RDW: 19.8 % — ABNORMAL HIGH (ref 11.5–15.5)
WBC: 12.4 10*3/uL — ABNORMAL HIGH (ref 4.0–10.5)
nRBC: 0 % (ref 0.0–0.2)

## 2022-08-09 LAB — RETIC PANEL
Immature Retic Fract: 11.3 % (ref 2.3–15.9)
RBC.: 4.15 MIL/uL (ref 3.87–5.11)
Retic Count, Absolute: 410.5 10*3/uL — ABNORMAL HIGH (ref 19.0–186.0)
Retic Ct Pct: 9.9 % — ABNORMAL HIGH (ref 0.4–3.1)
Reticulocyte Hemoglobin: 29.6 pg (ref >27.9)

## 2022-08-09 LAB — IRON AND TIBC
Iron: 96 ug/dL (ref 28–170)
Saturation Ratios: 22 % (ref 10.4–31.8)
TIBC: 440 ug/dL (ref 250–450)
UIBC: 344 ug/dL

## 2022-08-09 LAB — FERRITIN: Ferritin: 19 ng/mL (ref 11–307)

## 2022-08-09 LAB — VITAMIN B12: Vitamin B-12: 428 pg/mL (ref 180–914)

## 2022-08-14 LAB — HGB FRACTIONATION CASCADE
Hgb A2: 2.6 % (ref 1.8–3.2)
Hgb A: 97.1 % (ref 96.4–98.8)
Hgb F: 0.3 % (ref 0.0–2.0)
Hgb S: 0 %

## 2022-08-15 ENCOUNTER — Inpatient Hospital Stay (HOSPITAL_BASED_OUTPATIENT_CLINIC_OR_DEPARTMENT_OTHER): Payer: Medicaid Other | Admitting: Oncology

## 2022-08-15 ENCOUNTER — Encounter: Payer: Self-pay | Admitting: Oncology

## 2022-08-15 ENCOUNTER — Inpatient Hospital Stay: Payer: Medicaid Other

## 2022-08-15 VITALS — BP 107/66 | HR 93 | Temp 97.8°F | Wt 202.1 lb

## 2022-08-15 DIAGNOSIS — D519 Vitamin B12 deficiency anemia, unspecified: Secondary | ICD-10-CM

## 2022-08-15 DIAGNOSIS — E538 Deficiency of other specified B group vitamins: Secondary | ICD-10-CM | POA: Diagnosis not present

## 2022-08-15 DIAGNOSIS — D509 Iron deficiency anemia, unspecified: Secondary | ICD-10-CM

## 2022-08-15 DIAGNOSIS — F17290 Nicotine dependence, other tobacco product, uncomplicated: Secondary | ICD-10-CM

## 2022-08-15 DIAGNOSIS — D508 Other iron deficiency anemias: Secondary | ICD-10-CM

## 2022-08-15 DIAGNOSIS — Z23 Encounter for immunization: Secondary | ICD-10-CM

## 2022-08-15 MED ORDER — SODIUM CHLORIDE 0.9 % IV SOLN
Freq: Once | INTRAVENOUS | Status: AC
  Filled 2022-08-15: qty 250

## 2022-08-15 MED ORDER — SODIUM CHLORIDE 0.9 % IV SOLN
200.0000 mg | Freq: Once | INTRAVENOUS | Status: AC
  Administered 2022-08-15: 200 mg via INTRAVENOUS
  Filled 2022-08-15: qty 200

## 2022-08-15 MED ORDER — SODIUM CHLORIDE 0.9% FLUSH
10.0000 mL | Freq: Once | INTRAVENOUS | Status: DC | PRN
  Filled 2022-08-15: qty 10

## 2022-08-15 NOTE — Assessment & Plan Note (Signed)
Patient tolerates IV Venofer treatments previously. Labs reviewed and discussed with patient. Lab Results  Component Value Date   HGB 11.0 (L) 08/09/2022   TIBC 440 08/09/2022   IRONPCTSAT 22 08/09/2022   FERRITIN 19 08/09/2022     Recommend IV vnefoer x3.

## 2022-08-15 NOTE — Progress Notes (Signed)
Hematology/Oncology Progress note [phone] [phone]            Patient Care Team: Anselm Jungling, NP as PCP - General (Nurse Practitioner)  ASSESSMENT & PLAN:   IDA (iron deficiency anemia) Patient tolerates IV Venofer treatments previously. Labs reviewed and discussed with patient. Lab Results  Component Value Date   HGB 11.0 (L) 08/09/2022   TIBC 440 08/09/2022   IRONPCTSAT 22 08/09/2022   FERRITIN 19 08/09/2022     Recommend IV vnefoer x3.    Low serum vitamin B12 S/p vitamin B12 injections, she did not tolerate due to muscle cramps.  Recommend oral B12 daily.      Orders Placed This Encounter  Procedures   CBC with Differential (Cancer Center Only)    Standing Status:   Future    Standing Expiration Date:   08/15/2023   Iron and TIBC    Standing Status:   Future    Standing Expiration Date:   08/15/2023   Ferritin    Standing Status:   Future    Standing Expiration Date:   08/15/2023   Retic Panel    Standing Status:   Future    Standing Expiration Date:   08/15/2023    Follow-up in 4 months All questions were answered. The patient knows to call the clinic with any problems, questions or concerns.  Rickard Patience, MD, PhD [facility] Hematology Oncology 08/15/2022   CHIEF COMPLAINTS/REASON FOR VISIT:  iron deficiency anemia  HISTORY OF PRESENTING ILLNESS:   Melinda Stone is a  42 y.o.  female presents for iron deficiency anemia.  Patient had blood work done on 07/13/2021, CBC showed hemoglobin of 9.0, MCV 75.3, ferritin 14, iron saturation 21, TIBC 455.  Reviewed her previous lab records.  Patient has chronic anemia since at least 2019.  + Nausea, no vomiting.  Chronic diarrhea after each meal.  Unintentional weight loss Patient reports that she has had colonoscopy done in May which did not reveal etiology of diarrhea.  Colonoscopy record was not available to me at the time of dictation. 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    INTERVAL HISTORY Melinda Stone is a 42 y.o. female who has above history reviewed by me today presents for follow up visit for iron deficiency anemia Patient takes oral vitamin B12 supplementation at 1000 mcg daily. Patient feels tired.  Otherwise no new complaints.  Review of Systems  Constitutional:  Positive for fatigue. Negative for chills and fever.  HENT:   Negative for hearing loss and voice change.   Eyes:  Negative for eye problems.  Respiratory:  Negative for chest tightness and cough.   Cardiovascular:  Negative for chest pain.  Gastrointestinal:  Negative for abdominal distention, abdominal pain, blood in stool, diarrhea, nausea and vomiting.  Endocrine: Negative for hot flashes.  Genitourinary:  Negative for difficulty urinating and frequency.   Musculoskeletal:  Positive for back pain. Negative for arthralgias.  Skin:  Negative for itching and rash.  Neurological:  Positive for numbness. Negative for extremity weakness.  Hematological:  Negative for adenopathy.  Psychiatric/Behavioral:  Negative for confusion.     MEDICAL HISTORY:  Past Medical History:  Diagnosis Date   Anemia    Arthritis 08/17/2020   Chronic back pain    Diabetes mellitus without complication (HCC)    type 2   GERD (gastroesophageal reflux disease)    Migraine    Ovarian cyst    Pneumonia  Polyneuropathy     SURGICAL HISTORY: Past Surgical History:  Procedure Laterality Date   CESAREAN SECTION     CHOLECYSTECTOMY  2004   LUMBAR LAMINECTOMY N/A 05/30/2020   Procedure: Lumbar five Laminectomy,  Bilateral Microdiscectomy, Left Lumbar five -Sacral one Microdiscectomy;  Surgeon: [name], MD;  Location: [facility] OR;  Service: Orthopedics;  Laterality: N/A;   LUMBAR LAMINECTOMY Left 11/14/2020   Procedure: LEFT LUMBAR FOUR-FIVE MICRODISCECTOMY;  Surgeon: [name], MD;  Location: [facility] OR;  Service: Orthopedics;   Laterality: Left;   TUBAL LIGATION  10/19/2010    SOCIAL HISTORY: Social History   Socioeconomic History   Marital status: Single    Spouse name: Not on file   Number of children: 3   Years of education: Not on file   Highest education level: High school graduate  Occupational History    Comment: home maker  Tobacco Use   Smoking status: Every Day    Types: Cigars    Last attempt to quit: 12/23/2021    Years since quitting: 0.6   Smokeless tobacco: Never   Tobacco comments:    Not smoking as of 12/23/21. Verified 01/15/22 LW  Vaping Use   Vaping Use: Never used  Substance and Sexual Activity   Alcohol use: Not Currently   Drug use: Not Currently    Comment: per patient stopped smoking marijuana Mar 2022   Sexual activity: Yes    Birth control/protection: Surgical  Other Topics Concern   Not on file  Social History Narrative   Lives with 3 children   Some coffee   Social Determinants of Health   Financial Resource Strain: Not on file  Food Insecurity: No Food Insecurity (05/13/2022)   Hunger Vital Sign    Worried About Running Out of Food in the Last Year: Never true    Ran Out of Food in the Last Year: Never true  Transportation Needs: No Transportation Needs (05/13/2022)   PRAPARE - Administrator, Civil Service (Medical): No    Lack of Transportation (Non-Medical): No  Physical Activity: Not on file  Stress: Not on file  Social Connections: Not on file  Intimate Partner Violence: Not At Risk (05/13/2022)   Humiliation, Afraid, Rape, and Kick questionnaire    Fear of Current or Ex-Partner: No    Emotionally Abused: No    Physically Abused: No    Sexually Abused: No    FAMILY HISTORY: Family History  Problem Relation Age of Onset   Diabetes Mother    Hypertension Mother    Stroke Mother     ALLERGIES:  is allergic to trulicity [dulaglutide].  MEDICATIONS:  Current Outpatient Medications  Medication Sig Dispense Refill   amitriptyline  (ELAVIL) 25 MG tablet Take 1 tablet (25 mg total) by mouth at bedtime. 30 tablet 5   cyanocobalamin (VITAMIN B12) 1000 MCG tablet Take 1 tablet (1,000 mcg total) by mouth daily. 90 tablet 1   empagliflozin (JARDIANCE) 10 MG TABS tablet Take 1 tablet (10 mg total) by mouth daily before breakfast. 90 tablet 2   fluconazole (DIFLUCAN) 150 MG tablet Take 1 tablet (150 mg total) by mouth daily. 1 tablet 0   gabapentin (NEURONTIN) 800 MG tablet Take 800 mg by mouth in the morning, at noon, in the evening, and at bedtime.     Galcanezumab-gnlm (EMGALITY) 120 MG/ML SOAJ Inject 120 mg into the skin every 30 (thirty) days. 1.12 mL 11   glipiZIDE (GLUCOTROL) 10 MG tablet TAKE 2  TABLETS BY MOUTH DAILY BEFORE BREAKFAST AND 1 DAILY BEFORE SUPPER 270 tablet 1   guaiFENesin-dextromethorphan (ROBITUSSIN DM) 100-10 MG/5ML syrup Take 5 mLs by mouth every 4 (four) hours as needed for cough. 118 mL 0   metroNIDAZOLE (FLAGYL) 500 MG tablet Take 500 mg by mouth 2 (two) times daily.     ondansetron (ZOFRAN-ODT) 4 MG disintegrating tablet Take 1 tablet (4 mg total) by mouth 3 (three) times daily as needed for nausea or vomiting. 30 tablet 2   oxyCODONE-acetaminophen (PERCOCET/ROXICET) 5-325 MG tablet Take 1 tablet by mouth 4 (four) times daily as needed for moderate pain or severe pain.     pantoprazole (PROTONIX) 40 MG tablet Take 1 tablet (40 mg total) by mouth daily. 30 tablet 0   prednisoLONE acetate (PRED FORTE) 1 % ophthalmic suspension Place 1 drop into the left eye 4 (four) times daily.     predniSONE (DELTASONE) 20 MG tablet Take 2 tablets (40 mg total) by mouth daily. 10 tablet 0   SUMAtriptan (IMITREX) 50 MG tablet Take 1 tablet (50 mg total) by mouth every 2 (two) hours as needed for migraine. May repeat in 2 hours if headache persists or recurs. 10 tablet 5   tiZANidine (ZANAFLEX) 4 MG tablet Take 4 mg by mouth 2 (two) times daily.     vitamin C (ASCORBIC ACID) 250 MG tablet Take 2 tablets (500 mg total) by  mouth daily. (Patient taking differently: Take 250 mg by mouth daily.) 180 tablet 0   No current facility-administered medications for this visit.   Facility-Administered Medications Ordered in Other Visits  Medication Dose Route Frequency Provider Last Rate Last Admin   sodium chloride flush (NS) 0.9 % injection 10 mL  10 mL Intracatheter Once PRN Rickard Patience, MD         PHYSICAL EXAMINATION: ECOG PERFORMANCE STATUS: 2 - Symptomatic, <50% confined to bed Vitals:   08/15/22 1445  BP: 107/66  Pulse: 93  Temp: 97.8 F (36.6 C)  SpO2: 96%   Filed Weights   08/15/22 1445  Weight: 202 lb 1.6 oz (91.7 kg)      Physical Exam Constitutional:      General: She is not in acute distress.    Comments: Patient sits in the wheelchair  HENT:     Head: Normocephalic and atraumatic.  Eyes:     General: No scleral icterus. Cardiovascular:     Rate and Rhythm: Normal rate and regular rhythm.     Heart sounds: Normal heart sounds.  Pulmonary:     Effort: Pulmonary effort is normal. No respiratory distress.     Breath sounds: No wheezing.  Abdominal:     General: Bowel sounds are normal. There is no distension.     Palpations: Abdomen is soft.  Musculoskeletal:        General: No deformity. Normal range of motion.     Cervical back: Normal range of motion and neck supple.  Skin:    General: Skin is warm and dry.  Neurological:     Mental Status: She is alert and oriented to person, place, and time. Mental status is at baseline.     Cranial Nerves: No cranial nerve deficit.     Coordination: Coordination normal.  Psychiatric:        Mood and Affect: Mood normal.     LABORATORY DATA:  I have reviewed the data as listed    Latest Ref Rng & Units 08/09/2022    2:43 PM 05/30/2022  10:22 AM 05/18/2022    9:46 AM  CBC  WBC 4.0 - 10.5 K/uL 12.4  7.2  9.3   Hemoglobin 12.0 - 15.0 g/dL 91.4  78.2  9.8   Hematocrit 36.0 - 46.0 % 31.1  36.2  30.2   Platelets 150 - 400 K/uL 259  189  240        Latest Ref Rng & Units 05/18/2022    9:46 AM 05/15/2022    4:39 AM 05/14/2022   11:03 AM  CMP  Glucose 70 - 99 mg/dL  956    BUN 6 - 20 mg/dL  5    Creatinine 2.13 - 1.00 mg/dL  0.86    Sodium 578 - 469 mmol/L  137    Potassium 3.5 - 5.1 mmol/L  4.0    Chloride 98 - 111 mmol/L  109    CO2 22 - 32 mmol/L  22    Calcium 8.9 - 10.3 mg/dL  8.6    Total Protein 6.5 - 8.1 g/dL 7.1  6.2  6.1   Total Bilirubin 0.3 - 1.2 mg/dL 1.0  1.4  1.9   Alkaline Phos 38 - 126 U/L 100  93  99   AST 15 - 41 U/L 15  16  16    ALT 0 - 44 U/L 15  17  19       Iron/TIBC/Ferritin/ %Sat    Component Value Date/Time   IRON 96 08/09/2022 1443   IRON 64 01/02/2019 0955   TIBC 440 08/09/2022 1443   TIBC 382 01/02/2019 0955   FERRITIN 19 08/09/2022 1443   FERRITIN 21 01/02/2019 0955   IRONPCTSAT 22 08/09/2022 1443   IRONPCTSAT 17 01/02/2019 0955      RADIOGRAPHIC STUDIES: I have personally reviewed the radiological images as listed and agreed with the findings in the report. MM 3D SCREENING MAMMOGRAM BILATERAL BREAST  Result Date: 07/19/2022 CLINICAL DATA:  Screening. EXAM: DIGITAL SCREENING BILATERAL MAMMOGRAM WITH TOMOSYNTHESIS AND CAD TECHNIQUE: Bilateral screening digital craniocaudal and mediolateral oblique mammograms were obtained. Bilateral screening digital breast tomosynthesis was performed. The images were evaluated with computer-aided detection. COMPARISON:  None available. ACR Breast Density Category b: There are scattered areas of fibroglandular density. FINDINGS: There are no findings suspicious for malignancy. IMPRESSION: No mammographic evidence of malignancy. A result letter of this screening mammogram will be mailed directly to the patient. RECOMMENDATION: Screening mammogram in one year. (Code:SM-B-01Y) BI-RADS CATEGORY  1: Negative. Electronically Signed   By: Bary Richard M.D.   On: 07/19/2022 10:15

## 2022-08-15 NOTE — Assessment & Plan Note (Signed)
S/p vitamin B12 injections, she did not tolerate due to muscle cramps.  Recommend oral B12 1000mcg daily.   

## 2022-08-23 ENCOUNTER — Inpatient Hospital Stay: Payer: Medicaid Other | Attending: Oncology

## 2022-08-23 VITALS — BP 122/72 | HR 90 | Temp 98.0°F | Resp 17

## 2022-08-23 DIAGNOSIS — E538 Deficiency of other specified B group vitamins: Secondary | ICD-10-CM | POA: Insufficient documentation

## 2022-08-23 DIAGNOSIS — D509 Iron deficiency anemia, unspecified: Secondary | ICD-10-CM | POA: Diagnosis present

## 2022-08-23 DIAGNOSIS — Z23 Encounter for immunization: Secondary | ICD-10-CM

## 2022-08-23 DIAGNOSIS — D519 Vitamin B12 deficiency anemia, unspecified: Secondary | ICD-10-CM

## 2022-08-23 MED ORDER — SODIUM CHLORIDE 0.9 % IV SOLN
200.0000 mg | Freq: Once | INTRAVENOUS | Status: AC
  Administered 2022-08-23: 200 mg via INTRAVENOUS
  Filled 2022-08-23: qty 10

## 2022-08-23 MED ORDER — SODIUM CHLORIDE 0.9 % IV SOLN
INTRAVENOUS | Status: DC
  Filled 2022-08-23: qty 250

## 2022-08-23 NOTE — Patient Instructions (Signed)

## 2022-08-24 ENCOUNTER — Emergency Department (HOSPITAL_COMMUNITY)
Admission: EM | Admit: 2022-08-24 | Discharge: 2022-08-25 | Disposition: A | Discharge status: Home / Self Care | Payer: Medicaid Other | Attending: Emergency Medicine | Admitting: Emergency Medicine

## 2022-08-24 DIAGNOSIS — Z5321 Procedure and treatment not carried out due to patient leaving prior to being seen by health care provider: Secondary | ICD-10-CM | POA: Diagnosis not present

## 2022-08-24 DIAGNOSIS — J029 Acute pharyngitis, unspecified: Secondary | ICD-10-CM | POA: Insufficient documentation

## 2022-08-24 DIAGNOSIS — R059 Cough, unspecified: Secondary | ICD-10-CM | POA: Diagnosis not present

## 2022-08-24 NOTE — ED Notes (Signed)
Pt. Called for triage x3 w/ no response. Moved to [facility]

## 2022-08-30 ENCOUNTER — Inpatient Hospital Stay: Payer: Medicaid Other

## 2022-08-30 VITALS — BP 108/73 | HR 99 | Temp 99.3°F | Resp 18

## 2022-08-30 DIAGNOSIS — Z23 Encounter for immunization: Secondary | ICD-10-CM

## 2022-08-30 DIAGNOSIS — D519 Vitamin B12 deficiency anemia, unspecified: Secondary | ICD-10-CM

## 2022-08-30 DIAGNOSIS — D509 Iron deficiency anemia, unspecified: Secondary | ICD-10-CM | POA: Diagnosis not present

## 2022-08-30 MED ORDER — SODIUM CHLORIDE 0.9 % IV SOLN
INTRAVENOUS | Status: DC
  Filled 2022-08-30: qty 250

## 2022-08-30 MED ORDER — SODIUM CHLORIDE 0.9 % IV SOLN
200.0000 mg | Freq: Once | INTRAVENOUS | Status: AC
  Administered 2022-08-30: 200 mg via INTRAVENOUS
  Filled 2022-08-30: qty 200

## 2022-08-30 NOTE — Patient Instructions (Signed)

## 2022-08-31 ENCOUNTER — Other Ambulatory Visit (HOSPITAL_COMMUNITY): Payer: Self-pay

## 2022-09-03 ENCOUNTER — Emergency Department (HOSPITAL_COMMUNITY)
Admission: EM | Admit: 2022-09-03 | Discharge: 2022-09-03 | Disposition: A | Discharge status: Home / Self Care | Payer: Medicaid Other | Attending: Emergency Medicine | Admitting: Emergency Medicine

## 2022-09-03 ENCOUNTER — Other Ambulatory Visit: Payer: Self-pay

## 2022-09-03 ENCOUNTER — Encounter (HOSPITAL_COMMUNITY): Payer: Self-pay

## 2022-09-03 DIAGNOSIS — E119 Type 2 diabetes mellitus without complications: Secondary | ICD-10-CM | POA: Insufficient documentation

## 2022-09-03 DIAGNOSIS — L02214 Cutaneous abscess of groin: Secondary | ICD-10-CM | POA: Diagnosis not present

## 2022-09-03 DIAGNOSIS — Z7984 Long term (current) use of oral hypoglycemic drugs: Secondary | ICD-10-CM | POA: Insufficient documentation

## 2022-09-03 LAB — POC URINE PREG, ED: Preg Test, Ur: NEGATIVE

## 2022-09-03 MED ORDER — DOXYCYCLINE HYCLATE 100 MG PO CAPS: 100.0000 mg | ORAL_CAPSULE | Freq: Two times a day (BID) | ORAL | 0 refills | Status: DC

## 2022-09-03 MED ORDER — LIDOCAINE-EPINEPHRINE 1 %-1:100000 IJ SOLN
5.0000 mL | Freq: Once | INTRAMUSCULAR | Status: AC
  Administered 2022-09-03: 5 mL
  Filled 2022-09-03: qty 1

## 2022-09-03 NOTE — ED Provider Notes (Signed)
[place] EMERGENCY DEPARTMENT AT [facility] Provider Note   CSN: [id number] Arrival date & time: 09/03/22  1700     History  Chief Complaint  Patient presents with   Vaginal Boil    Melinda Stone is a 42 y.o. female, history of diabetes, who presents to the ED secondary to indurated area around her suprapubic area, that she states has had an abscess before.  She states that she noticed this area was ballooning, last few days, and wanted to get checked out before got too big.  Denies any fevers, chills.  Is not have any problems urinating or having bowel movements.   Home Medications Prior to Admission medications   Medication Sig Start Date End Date Taking? Authorizing Provider  doxycycline (VIBRAMYCIN) 100 MG capsule Take 1 capsule (100 mg total) by mouth 2 (two) times daily. 09/03/22  Yes Esmee Fallaw L, PA  amitriptyline (ELAVIL) 25 MG tablet Take 1 tablet (25 mg total) by mouth at bedtime. 07/11/22   Glean Salvo, NP  cyanocobalamin (VITAMIN B12) 1000 MCG tablet Take 1 tablet (1,000 mcg total) by mouth daily. 04/10/22   Rickard Patience, MD  empagliflozin (JARDIANCE) 10 MG TABS tablet Take 1 tablet (10 mg total) by mouth daily before breakfast. 04/16/22   Shamleffer, Konrad Dolores, MD  fluconazole (DIFLUCAN) 150 MG tablet Take 1 tablet (150 mg total) by mouth daily. 05/15/22   Danford, Earl Lites, MD  gabapentin (NEURONTIN) 800 MG tablet Take 800 mg by mouth in the morning, at noon, in the evening, and at bedtime. 04/04/21   [provider]  Galcanezumab-gnlm (EMGALITY) 120 MG/ML SOAJ Inject 120 mg into the skin every 30 (thirty) days. 07/11/22   Glean Salvo, NP  glipiZIDE (GLUCOTROL) 10 MG tablet TAKE 2 TABLETS BY MOUTH DAILY BEFORE BREAKFAST AND 1 DAILY BEFORE SUPPER 07/27/22   Shamleffer, Konrad Dolores, MD  guaiFENesin-dextromethorphan (ROBITUSSIN DM) 100-10 MG/5ML syrup Take 5 mLs by mouth every 4 (four) hours as needed for cough. 05/07/22   Rhetta Mura, MD  metroNIDAZOLE (FLAGYL) 500 MG tablet Take 500 mg by mouth 2 (two) times daily. 05/23/22   [provider]  ondansetron (ZOFRAN-ODT) 4 MG disintegrating tablet Take 1 tablet (4 mg total) by mouth 3 (three) times daily as needed for nausea or vomiting. 05/15/22   Danford, Earl Lites, MD  oxyCODONE-acetaminophen (PERCOCET/ROXICET) 5-325 MG tablet Take 1 tablet by mouth 4 (four) times daily as needed for moderate pain or severe pain. 03/13/21   [provider]  pantoprazole (PROTONIX) 40 MG tablet Take 1 tablet (40 mg total) by mouth daily. 06/12/21   Alwyn Ren, MD  prednisoLONE acetate (PRED FORTE) 1 % ophthalmic suspension Place 1 drop into the left eye 4 (four) times daily. 04/26/22   [provider]  predniSONE (DELTASONE) 20 MG tablet Take 2 tablets (40 mg total) by mouth daily. 07/21/22   Roxy Horseman, PA-C  SUMAtriptan (IMITREX) 50 MG tablet Take 1 tablet (50 mg total) by mouth every 2 (two) hours as needed for migraine. May repeat in 2 hours if headache persists or recurs. 07/11/22   Glean Salvo, NP  tiZANidine (ZANAFLEX) 4 MG tablet Take 4 mg by mouth 2 (two) times daily. 04/26/22   [provider]  vitamin C (ASCORBIC ACID) 250 MG tablet Take 2 tablets (500 mg total) by mouth daily. Patient taking differently: Take 250 mg by mouth daily. 04/10/22   Rickard Patience, MD      Allergies  Trulicity [dulaglutide]    Review of Systems   Review of Systems  Constitutional:  Negative for fever.  Skin:  Positive for rash.    Physical Exam Updated Vital Signs BP 123/69   Pulse 87   Temp 98.9 F (37.2 C) (Oral)   Resp 15   SpO2 100%  Physical Exam Vitals and nursing note reviewed.  Constitutional:      General: She is not in acute distress.    Appearance: She is well-developed.  HENT:     Head: Normocephalic and atraumatic.  Eyes:     Conjunctiva/sclera: Conjunctivae normal.  Cardiovascular:     Rate and Rhythm: Normal rate and  regular rhythm.     Heart sounds: No murmur heard. Pulmonary:     Effort: Pulmonary effort is normal. No respiratory distress.     Breath sounds: Normal breath sounds.  Abdominal:     Palpations: Abdomen is soft.     Tenderness: There is no abdominal tenderness.  Musculoskeletal:        General: No swelling.     Cervical back: Neck supple.  Skin:    General: Skin is warm and dry.     Capillary Refill: Capillary refill takes less than 2 seconds.     Comments: +indurated area along L suprapubic area.  Neurological:     Mental Status: She is alert.  Psychiatric:        Mood and Affect: Mood normal.     ED Results / Procedures / Treatments   Labs (all labs ordered are listed, but only abnormal results are displayed) Labs Reviewed  POC URINE PREG, ED    EKG None  Radiology No results found.  Procedures .Marland KitchenIncision and Drainage  Date/Time: 09/03/2022 9:00 PM  Performed by: Pete Pelt, PA Authorized by: Pete Pelt, PA   Consent:    Consent obtained:  Verbal   Consent given by:  Patient   Risks, benefits, and alternatives were discussed: yes     Risks discussed:  Bleeding, damage to other organs, infection, incomplete drainage and pain   Alternatives discussed:  Alternative treatment Universal protocol:    Patient identity confirmed:  Verbally with patient Location:    Type:  Abscess   Size:  1x1x0.5cm   Location: L suprapubic. Pre-procedure details:    Skin preparation:  Povidone-iodine Sedation:    Sedation type:  None Anesthesia:    Anesthesia method:  Local infiltration   Local anesthetic:  Lidocaine 1% WITH epi Procedure type:    Complexity:  Simple Procedure details:    Incision types:  Stab incision   Drainage:  Purulent and bloody   Drainage amount:  Scant   Wound treatment:  Wound left open   Packing materials:  None Post-procedure details:    Procedure completion:  Tolerated Ultrasound ED Soft Tissue  Date/Time: 09/03/2022 9:01  PM  Performed by: Pete Pelt, PA Authorized by: Pete Pelt, PA   Procedure details:    Indications: localization of abscess     Transverse view:  Visualized   Longitudinal view:  Visualized   Images: not archived     Limitations:  Body habitus Location:    Location: groin     Side:  Left Findings:     abscess present Comments:     0.5cmx1cmx1cm, superficial abscess w/o any fistula noted     Medications Ordered in ED Medications  lidocaine-EPINEPHrine (XYLOCAINE W/EPI) 1 %-1:100000 (with pres) injection 5 mL (5 mLs Other Given 09/03/22 2038)  ED Course/ Medical Decision Making/ A&P                             Medical Decision Making Patient is a well-appearing 41 year old female, history of diabetes, here for an abscess on her suprapubic area.  She states she has read them recurrently.  Utilize ultrasound, to evaluate, it is appears very superficial no evidence of fistula, incision and drainage performed.  She has no evidence of crepitus is well-appearing, and has had these recurrently.  We will place her on doxycycline outpatient for further treatment.  We discussed emergent return precautions and she voiced understanding  Risk Prescription drug management.    Final Clinical Impression(s) / ED Diagnoses Final diagnoses:  Abscess of groin, left    Rx / DC Orders ED Discharge Orders          Ordered    doxycycline (VIBRAMYCIN) 100 MG capsule  2 times daily        09/03/22 2102    fluconazole (DIFLUCAN) 150 MG tablet  Daily        Pending              Pete Pelt, PA 09/03/22 2336    Terald Sleeper, MD 09/04/22 1435

## 2022-09-03 NOTE — Discharge Instructions (Addendum)
Please follow-up with your primary care doctor, return to the ER if you have any redness, swelling of the area, or any kind of worsening pain.  Make sure you take the antibiotics as prescribed.  I prescribed you Diflucan, to help with the yeast infection, after you finish the course of antibiotics.

## 2022-09-03 NOTE — ED Provider Triage Note (Signed)
Emergency Medicine Provider Triage Evaluation Note  Melinda Stone , a 42 y.o. female  was evaluated in triage.  Pt complains of abscess. Report she has a labial abscess that was I&D several weeks ago and now it has re accumulate.  No fever, vaginal bleeding, vaginal discharge or dysuria.  Is UTD with tdap  Review of Systems  Positive: As above Negative: As above  Physical Exam  BP 126/76   Pulse (!) 103   Temp 99.6 F (37.6 C) (Oral)   Resp 18   SpO2 100%  Gen:   Awake, no distress   Resp:  Normal effort  MSK:   Moves extremities without difficulty  Other:    Medical Decision Making  Medically screening exam initiated at 5:18 PM.  Appropriate orders placed.  Zabdi L Sentell was informed that the remainder of the evaluation will be completed by another provider, this initial triage assessment does not replace that evaluation, and the importance of remaining in the ED until their evaluation is complete.     Fayrene Helper, PA-C 09/03/22 1721

## 2022-09-03 NOTE — ED Triage Notes (Signed)
Pt came in via POV d/t an abscess in her vagina in the same area as the last one she had it drained here at the ED. A/Ox4, rates pain 8/10 while I triage.

## 2022-09-18 ENCOUNTER — Telehealth: Payer: Self-pay | Admitting: Orthopedic Surgery

## 2022-09-18 NOTE — Telephone Encounter (Signed)
Received vm from patient. IC spoke with patient. Advised will need to come in to sign authorization to release medical records. Once received, request can be processed. Pts ph [phone]

## 2022-10-16 ENCOUNTER — Ambulatory Visit (INDEPENDENT_AMBULATORY_CARE_PROVIDER_SITE_OTHER): Payer: Medicaid Other | Admitting: Internal Medicine

## 2022-10-16 ENCOUNTER — Encounter: Payer: Self-pay | Admitting: Internal Medicine

## 2022-10-16 VITALS — BP 110/70 | HR 76 | Ht 69.0 in | Wt 202.0 lb

## 2022-10-16 DIAGNOSIS — E1165 Type 2 diabetes mellitus with hyperglycemia: Secondary | ICD-10-CM

## 2022-10-16 DIAGNOSIS — E1142 Type 2 diabetes mellitus with diabetic polyneuropathy: Secondary | ICD-10-CM

## 2022-10-16 DIAGNOSIS — Z7984 Long term (current) use of oral hypoglycemic drugs: Secondary | ICD-10-CM

## 2022-10-16 LAB — POCT GLUCOSE (DEVICE FOR HOME USE): POC Glucose: 422 mg/dl — AB (ref 70–99)

## 2022-10-16 LAB — POCT GLYCOSYLATED HEMOGLOBIN (HGB A1C): Hemoglobin A1C: 7.9 % — AB (ref 4.0–5.6)

## 2022-10-16 MED ORDER — EMPAGLIFLOZIN 25 MG PO TABS
25.0000 mg | ORAL_TABLET | Freq: Every day | ORAL | 3 refills | Status: DC
Start: 2022-10-16 — End: 2023-02-04

## 2022-10-16 MED ORDER — SEMAGLUTIDE(0.25 OR 0.5MG/DOS) 2 MG/3ML ~~LOC~~ SOPN: 0.5000 mg | PEN_INJECTOR | SUBCUTANEOUS | 3 refills | Status: DC

## 2022-10-16 MED ORDER — SEMAGLUTIDE(0.25 OR 0.5MG/DOS) 2 MG/3ML ~~LOC~~ SOPN: 0.2500 mg | PEN_INJECTOR | SUBCUTANEOUS | Status: DC

## 2022-10-16 MED ORDER — GLIPIZIDE 10 MG PO TABS: ORAL_TABLET | ORAL | 3 refills | Status: DC

## 2022-10-16 NOTE — Patient Instructions (Addendum)
-   Continue Glipizide 10 mg, TWO  tablets before Breakfast and ONE  Tablet Before supper  - Increase  Jardiance 25 mg 1 tablet every morning  - Start Ozempic 0.25 mg weekly for 6 weeks, than increase to 0.5 mg weekly     HOW TO TREAT LOW BLOOD SUGARS (Blood sugar LESS THAN 70 MG/DL) Please follow the RULE OF 15 for the treatment of hypoglycemia treatment (when your (blood sugars are less than 70 mg/dL)   STEP 1: Take 15 grams of carbohydrates when your blood sugar is low, which includes:  3-4 GLUCOSE TABS  OR 3-4 OZ OF JUICE OR REGULAR SODA OR ONE TUBE OF GLUCOSE GEL    STEP 2: RECHECK blood sugar in 15 MINUTES STEP 3: If your blood sugar is still low at the 15 minute recheck --> then, go back to STEP 1 and treat AGAIN with another 15 grams of carbohydrates.

## 2022-10-16 NOTE — Progress Notes (Signed)
Name: Melinda Stone  Age/ Sex: 42 y.o., female   MRN/ DOB: [id number], [birth year]     PCP: Anselm Jungling, NP   Reason for Endocrinology Evaluation: Type 2 Diabetes Mellitus  Initial Endocrine Consultative Visit: 12/03/2018    PATIENT IDENTIFIER: Melinda Stone is a 42 y.o. female with a past medical history of T2DM. The patient has followed with Endocrinology clinic since 12/03/2018 for consultative assistance with management of her diabetes.  DIABETIC HISTORY:  Melinda Stone was diagnosed with DMat age 8. Metformin had caused GI issues and hypoglycemia. Her hemoglobin A1c has ranged from 7.0% in 04/2018, peaking at 7.8% in 10/2018  On her initial visit to our clinic she had an A1c of 7.8%. She was on Jardiance, she had a prescription for Trulicity and Jardiance were too costly for her. We added glipizide .    Atteted to  Jardiance 09/2020 but she did not pick it up  Trulicity started 2022 but by 08/2021 she developed GI side effects and we stopped   Lives with kids 12 and 9 yrs of age   SUBJECTIVE:   During the last visit (04/16/2022): A1c 4.4 % .      Today (10/16/2022): Melinda Stone is here for a follow up on diabetes.  She checks her blood sugars occasionally . The patient has had hypoglycemic episodes since the last clinic visit.   Since her last visit here she has been diagnosed with pneumonia 04/2022  She had another ED presentation with a hemoglobin of 6.7, received another transfusion  She presented again to the ED May/2024 with left groin abscess  Last iron transfusion 08/2022  She has been eating chips, she claims that she  avoids sugar-sweetened beverages but this morning she had 2 pouches of caprisun  She has chronic abdominal pain, despite being off trulicity for months, had recent colonoscopy. She continues with occasional vomiting , no change in bowel movements.    HOME DIABETES REGIMEN:  Glipizide 10 mg 2 tabs before breakfast and 1 tab  before Supper  Jardiance 10 mg daily    METER DOWNLOAD SUMMARY: n/a   DIABETIC COMPLICATIONS: Microvascular complications:   B/L retinopathy , S/P injections  Denies: CKD, neuropathy  Last eye exam: Completed 08/2021   Macrovascular complications:    Denies: CAD, PVD, CVA   HISTORY:  Past Medical History:  Past Medical History:  Diagnosis Date   Anemia    Arthritis 08/17/2020   Chronic back pain    Diabetes mellitus without complication (HCC)    type 2   GERD (gastroesophageal reflux disease)    Migraine    Ovarian cyst    Pneumonia    Polyneuropathy    Past Surgical History:  Past Surgical History:  Procedure Laterality Date   CESAREAN SECTION     CHOLECYSTECTOMY  2004   LUMBAR LAMINECTOMY N/A 05/30/2020   Procedure: Lumbar five Laminectomy,  Bilateral Microdiscectomy, Left Lumbar five -Sacral one Microdiscectomy;  Surgeon: [name], MD;  Location: [facility] OR;  Service: Orthopedics;  Laterality: N/A;   LUMBAR LAMINECTOMY Left 11/14/2020   Procedure: LEFT LUMBAR FOUR-FIVE MICRODISCECTOMY;  Surgeon: [name], MD;  Location: [facility] OR;  Service: Orthopedics;  Laterality: Left;   TUBAL LIGATION  10/19/2010   Social History:  reports that she has been smoking cigars. She has never used smokeless tobacco. She reports that she does not currently use alcohol. She reports that she does not currently use drugs. Family History:  Family History  Problem Relation Age of Onset   Diabetes Mother    Hypertension Mother    Stroke Mother      HOME MEDICATIONS: Allergies as of 10/16/2022       Reactions   Trulicity [dulaglutide] Diarrhea        Medication List        Accurate as of October 16, 2022  9:20 AM. If you have any questions, ask your nurse or doctor.          STOP taking these medications    doxycycline 100 MG capsule Commonly known as: VIBRAMYCIN Stopped by: Johnney Ou Warda Mcqueary       TAKE these medications    amitriptyline 25 MG tablet Commonly  known as: ELAVIL Take 1 tablet (25 mg total) by mouth at bedtime.   cyanocobalamin 1000 MCG tablet Commonly known as: VITAMIN B12 Take 1 tablet (1,000 mcg total) by mouth daily.   Emgality 120 MG/ML Soaj Generic drug: Galcanezumab-gnlm Inject 120 mg into the skin every 30 (thirty) days.   empagliflozin 10 MG Tabs tablet Commonly known as: Jardiance Take 1 tablet (10 mg total) by mouth daily before breakfast.   fluconazole 150 MG tablet Commonly known as: Diflucan Take 1 tablet (150 mg total) by mouth daily.   gabapentin 800 MG tablet Commonly known as: NEURONTIN Take 800 mg by mouth in the morning, at noon, in the evening, and at bedtime.   glipiZIDE 10 MG tablet Commonly known as: GLUCOTROL TAKE 2 TABLETS BY MOUTH DAILY BEFORE BREAKFAST AND 1 DAILY BEFORE SUPPER   guaiFENesin-dextromethorphan 100-10 MG/5ML syrup Commonly known as: ROBITUSSIN DM Take 5 mLs by mouth every 4 (four) hours as needed for cough.   metroNIDAZOLE 500 MG tablet Commonly known as: FLAGYL Take 500 mg by mouth 2 (two) times daily.   ondansetron 4 MG disintegrating tablet Commonly known as: ZOFRAN-ODT Take 1 tablet (4 mg total) by mouth 3 (three) times daily as needed for nausea or vomiting.   oxyCODONE-acetaminophen 5-325 MG tablet Commonly known as: PERCOCET/ROXICET Take 1 tablet by mouth 4 (four) times daily as needed for moderate pain or severe pain.   pantoprazole 40 MG tablet Commonly known as: PROTONIX Take 1 tablet (40 mg total) by mouth daily.   prednisoLONE acetate 1 % ophthalmic suspension Commonly known as: PRED FORTE Place 1 drop into the left eye 4 (four) times daily.   predniSONE 20 MG tablet Commonly known as: DELTASONE Take 2 tablets (40 mg total) by mouth daily.   SUMAtriptan 50 MG tablet Commonly known as: Imitrex Take 1 tablet (50 mg total) by mouth every 2 (two) hours as needed for migraine. May repeat in 2 hours if headache persists or recurs.   tiZANidine 4 MG  tablet Commonly known as: ZANAFLEX Take 4 mg by mouth 2 (two) times daily.   vitamin C 250 MG tablet Commonly known as: ASCORBIC ACID Take 2 tablets (500 mg total) by mouth daily. What changed: how much to take         OBJECTIVE:   Vital Signs: BP 110/70 (BP Location: Left Arm, Patient Position: Sitting, Cuff Size: Large)   Pulse 76   Ht 5\' 9"  (1.753 m)   Wt 202 lb (91.6 kg)   SpO2 99%   BMI 29.83 kg/m   Wt Readings from Last 3 Encounters:  10/16/22 202 lb (91.6 kg)  08/15/22 202 lb 1.6 oz (91.7 kg)  08/04/22 199 lb (90.3 kg)     Exam: General: Pt appears drowsy, c/o  arthralgia   Lungs: Clear with good BS bilat with no rales, rhonchi, or wheezes  Heart: RRR   Extremities: No  pretibial edema.   Neuro: MS is good with appropriate affect, pt is alert and Ox3    DM foot exam: 10/16/2022 The skin of the feet is intact without sores or ulcerations. The pedal pulses are 2+ on right and 2+ on left. The sensation is absent to a screening 5.07, 10 gram monofilament bilaterally     DATA REVIEWED:  Lab Results  Component Value Date   HGBA1C 7.9 (A) 10/16/2022   HGBA1C 5.6 04/16/2022   HGBA1C 4.4 (L) 02/06/2022    Latest Reference Range & Units 02/24/22 23:39  Sodium 135 - 145 mmol/L 137  Potassium 3.5 - 5.1 mmol/L 4.5  Chloride 98 - 111 mmol/L 106  CO2 22 - 32 mmol/L 24  Glucose 70 - 99 mg/dL 045 (H)  BUN 6 - 20 mg/dL 9  Creatinine 4.09 - 8.11 mg/dL 9.14  Calcium 8.9 - 78.2 mg/dL 8.7 (L)  Anion gap 5 - 15  7  Alkaline Phosphatase 38 - 126 U/L 91  Albumin 3.5 - 5.0 g/dL 3.4 (L)  Lipase 11 - 51 U/L 42  AST 15 - 41 U/L 19  ALT 0 - 44 U/L 17  Total Protein 6.5 - 8.1 g/dL 6.9  Total Bilirubin 0.3 - 1.2 mg/dL 1.3 (H)  GFR, Estimated >60 mL/min >60    ASSESSMENT / PLAN / RECOMMENDATIONS:   1) Type 2 Diabetes Mellitus, Poorly controlled, With Neuropathic and retinopathic complications - Most recent A1c of 7.9  %. Goal A1c < 7.0 %.     - A1c skewed due to  recent iron infusions , and I suspect that her A1c is higher than 7.9% -In office BG 422 mg/DL -She initially stated that she does not drink any sugar sweetened beverages, but upon further questioning about her breakfast this morning the patient did have 2 patches of Capri sun, patient counseled again about avoiding sugar sweetened beverages and limiting carbohydrate intake -No meter today -She did develop GI side effects with Trulicity, the patient did like being on GLP-1 agonist as it  controlled her glucose better than anything else .  We discussed trying Ozempic, cautioned against GI side effects, patient will monitor if her underlying chronic GI side effects would worsen or remain stable -I will increase her Jardiance as below -Discussed risk of hypoglycemia with glipizide, patient to contact us with hypoglycemia so we will reduce glipizide dose   MEDICATIONS: Continue   glipizide 10 mg 2 tabs Before Breakfast and 1 tablet  before Supper  Increase Jardiance 25 mg daily Start Ozempic 0.25 mg weekly for 6 weeks, then increase to 0.5 mg weekly     EDUCATION / INSTRUCTIONS: BG monitoring instructions: Patient is instructed to check her blood sugars 2 times a day, fasting and supper time. Call [facility] if: BG persistently < 70  I reviewed the Rule of 15 for the treatment of hypoglycemia in detail with the patient. Literature supplied.    F/U in 3 months    Signed electronically by: Lyndle Herrlich, MD  [facility]  [facility] [address] [place] [place], [place] [address] Phone: [phone] FAX: [phone]   CC: Anselm Jungling, NP [address] [address] [place] [address] Phone: [phone]  Fax: [phone]  Return to Endocrinology clinic as below: Future Appointments  Date Time Provider Department Center  10/16/2022  9:30 AM  Kawanna Christley, Konrad Dolores, MD [facility] None  12/17/2022 11:30 AM [facility]  None  12/18/2022  1:00 PM Rickard Patience, MD [facility] None  12/18/2022  1:30 PM [facility] INFUSION CHAIR 18 [facility] None  01/31/2023  9:45 AM Glean Salvo, NP [facility] None

## 2022-10-16 NOTE — Addendum Note (Signed)
Addended by: Lisabeth Pick on: 10/16/2022 10:40 AM   Modules accepted: Orders

## 2022-10-30 ENCOUNTER — Other Ambulatory Visit (HOSPITAL_COMMUNITY): Payer: Self-pay

## 2022-10-30 ENCOUNTER — Telehealth: Payer: Self-pay

## 2022-10-30 NOTE — Telephone Encounter (Signed)
Pharmacy Patient Advocate Encounter   Received notification from CoverMyMeds that prior authorization for Ozempic is required/requested.   Insurance verification completed.   The patient is insured through [facility] .   Per test claim: PA required; PA submitted to [employer] via CoverMyMeds Key/confirmation #/[id number] [facility] Status is pending

## 2022-10-31 NOTE — Telephone Encounter (Signed)
Pharmacy Patient Advocate Encounter  Received notification from [facility] that Prior Authorization for Ozempic has been APPROVED through 10/30/23

## 2022-12-11 ENCOUNTER — Other Ambulatory Visit (HOSPITAL_COMMUNITY): Payer: Self-pay

## 2022-12-17 ENCOUNTER — Inpatient Hospital Stay: Payer: Medicaid Other | Attending: Oncology

## 2022-12-17 DIAGNOSIS — D509 Iron deficiency anemia, unspecified: Secondary | ICD-10-CM | POA: Insufficient documentation

## 2022-12-17 DIAGNOSIS — D519 Vitamin B12 deficiency anemia, unspecified: Secondary | ICD-10-CM

## 2022-12-17 LAB — FERRITIN: Ferritin: 12 ng/mL (ref 11–307)

## 2022-12-17 LAB — RETIC PANEL
Immature Retic Fract: 9.6 % (ref 2.3–15.9)
RBC.: 3.9 MIL/uL (ref 3.87–5.11)
Retic Count, Absolute: 219 10*3/uL — ABNORMAL HIGH (ref 19.0–186.0)
Retic Ct Pct: 5.6 % — ABNORMAL HIGH (ref 0.4–3.1)
Reticulocyte Hemoglobin: 30.5 pg (ref >27.9)

## 2022-12-17 LAB — CBC WITH DIFFERENTIAL (CANCER CENTER ONLY)
Abs Immature Granulocytes: 0.08 10*3/uL — ABNORMAL HIGH (ref 0.00–0.07)
Basophils Absolute: 0.1 10*3/uL (ref 0.0–0.1)
Basophils Relative: 1 %
Eosinophils Absolute: 0 10*3/uL (ref 0.0–0.5)
Eosinophils Relative: 0 %
HCT: 30 % — ABNORMAL LOW (ref 36.0–46.0)
Hemoglobin: 10 g/dL — ABNORMAL LOW (ref 12.0–15.0)
Immature Granulocytes: 1 %
Lymphocytes Relative: 20 %
Lymphs Abs: 1.5 10*3/uL (ref 0.7–4.0)
MCH: 24.8 pg — ABNORMAL LOW (ref 26.0–34.0)
MCHC: 33.3 g/dL (ref 30.0–36.0)
MCV: 74.3 fL — ABNORMAL LOW (ref 80.0–100.0)
Monocytes Absolute: 0.4 10*3/uL (ref 0.1–1.0)
Monocytes Relative: 5 %
Neutro Abs: 5.5 10*3/uL (ref 1.7–7.7)
Neutrophils Relative %: 73 %
Platelet Count: 144 10*3/uL — ABNORMAL LOW (ref 150–400)
RBC: 4.04 MIL/uL (ref 3.87–5.11)
RDW: 19.2 % — ABNORMAL HIGH (ref 11.5–15.5)
WBC Count: 7.5 10*3/uL (ref 4.0–10.5)
nRBC: 0 % (ref 0.0–0.2)

## 2022-12-17 LAB — IRON AND TIBC
Iron: 54 ug/dL (ref 28–170)
Saturation Ratios: 12 % (ref 10.4–31.8)
TIBC: 455 ug/dL — ABNORMAL HIGH (ref 250–450)
UIBC: 401 ug/dL

## 2022-12-18 ENCOUNTER — Encounter: Payer: Self-pay | Admitting: Oncology

## 2022-12-18 ENCOUNTER — Inpatient Hospital Stay: Payer: Medicaid Other

## 2022-12-18 ENCOUNTER — Inpatient Hospital Stay: Payer: Medicaid Other | Attending: Oncology | Admitting: Oncology

## 2022-12-18 VITALS — BP 138/91 | HR 99 | Resp 16

## 2022-12-18 VITALS — BP 135/90 | HR 99 | Temp 97.5°F | Resp 18 | Wt 198.7 lb

## 2022-12-18 DIAGNOSIS — D508 Other iron deficiency anemias: Secondary | ICD-10-CM | POA: Diagnosis not present

## 2022-12-18 DIAGNOSIS — E538 Deficiency of other specified B group vitamins: Secondary | ICD-10-CM | POA: Diagnosis not present

## 2022-12-18 DIAGNOSIS — Z23 Encounter for immunization: Secondary | ICD-10-CM

## 2022-12-18 DIAGNOSIS — D509 Iron deficiency anemia, unspecified: Secondary | ICD-10-CM | POA: Diagnosis present

## 2022-12-18 DIAGNOSIS — D519 Vitamin B12 deficiency anemia, unspecified: Secondary | ICD-10-CM

## 2022-12-18 MED ORDER — SODIUM CHLORIDE 0.9 % IV SOLN
200.0000 mg | Freq: Once | INTRAVENOUS | Status: AC
  Administered 2022-12-18: 200 mg via INTRAVENOUS
  Filled 2022-12-18: qty 200

## 2022-12-18 MED ORDER — SODIUM CHLORIDE 0.9 % IV SOLN
INTRAVENOUS | Status: DC
  Filled 2022-12-18: qty 250

## 2022-12-18 NOTE — Progress Notes (Signed)
Hematology/Oncology Progress note [phone] [phone]            Patient Care Team: Anselm Jungling, NP as PCP - General (Nurse Practitioner) Rickard Patience, MD as Consulting Physician (Oncology)  ASSESSMENT & PLAN:   IDA (iron deficiency anemia) Patient tolerates IV Venofer treatments previously. Labs reviewed and discussed with patient. Lab Results  Component Value Date   HGB 10.0 (L) 12/17/2022   TIBC 455 (H) 12/17/2022   IRONPCTSAT 12 12/17/2022   FERRITIN 12 12/17/2022     Recommend IV vnefoer x3.    Low serum vitamin B12 S/p vitamin B12 injections, she did not tolerate due to muscle cramps.  Recommend oral B12 daily.      Orders Placed This Encounter  Procedures   CBC with Differential (Cancer Center Only)    Standing Status:   Future    Standing Expiration Date:   12/18/2023   Iron and TIBC    Standing Status:   Future    Standing Expiration Date:   12/18/2023   Ferritin    Standing Status:   Future    Standing Expiration Date:   12/18/2023   Folate    Standing Status:   Future    Standing Expiration Date:   12/18/2023   Vitamin B12    Standing Status:   Future    Standing Expiration Date:   12/18/2023    Follow-up in 4 months All questions were answered. The patient knows to call the clinic with any problems, questions or concerns.  Rickard Patience, MD, PhD [facility] Hematology Oncology 12/18/2022   CHIEF COMPLAINTS/REASON FOR VISIT:  iron deficiency anemia  HISTORY OF PRESENTING ILLNESS:   Melinda Stone is a  42 y.o.  female presents for iron deficiency anemia.  Patient had blood work done on 07/13/2021, CBC showed hemoglobin of 9.0, MCV 75.3, ferritin 14, iron saturation 21, TIBC 455.  Reviewed her previous lab records.  Patient has chronic anemia since at least 2019.  + Nausea, no vomiting.  Chronic diarrhea after each meal.  Unintentional weight loss Patient reports that she has had colonoscopy done in May  which did not reveal etiology of diarrhea.  Colonoscopy record was not available to me at the time of dictation. 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    INTERVAL HISTORY Melinda Stone is a 42 y.o. female who has above history reviewed by me today presents for follow up visit for iron deficiency anemia Patient takes oral vitamin B12 supplementation at 1000 mcg daily. Chronic fatigue chronic diarrhea, unchanged.  Denies hematochezia, hematuria, hematemesis, epistaxis, black tarry stool or easy bruising.    Review of Systems  Constitutional:  Positive for fatigue. Negative for chills and fever.  HENT:   Negative for hearing loss and voice change.   Eyes:  Negative for eye problems.  Respiratory:  Negative for chest tightness and cough.   Cardiovascular:  Negative for chest pain.  Gastrointestinal:  Positive for diarrhea. Negative for abdominal distention, abdominal pain, blood in stool, nausea and vomiting.  Endocrine: Negative for hot flashes.  Genitourinary:  Negative for difficulty urinating and frequency.   Musculoskeletal:  Positive for back pain. Negative for arthralgias.  Skin:  Negative for itching and rash.  Neurological:  Positive for numbness. Negative for extremity weakness.  Hematological:  Negative for adenopathy.  Psychiatric/Behavioral:  Negative for confusion.     MEDICAL HISTORY:  Past Medical History:  Diagnosis Date   Anemia  Arthritis 08/17/2020   Chronic back pain    Diabetes mellitus without complication (HCC)    type 2   GERD (gastroesophageal reflux disease)    Migraine    Ovarian cyst    Pneumonia    Polyneuropathy     SURGICAL HISTORY: Past Surgical History:  Procedure Laterality Date   CESAREAN SECTION     CHOLECYSTECTOMY  2004   LUMBAR LAMINECTOMY N/A 05/30/2020   Procedure: Lumbar five Laminectomy,  Bilateral Microdiscectomy, Left Lumbar five -Sacral one Microdiscectomy;  Surgeon:  [name], MD;  Location: [facility] OR;  Service: Orthopedics;  Laterality: N/A;   LUMBAR LAMINECTOMY Left 11/14/2020   Procedure: LEFT LUMBAR FOUR-FIVE MICRODISCECTOMY;  Surgeon: [name], MD;  Location: [facility] OR;  Service: Orthopedics;  Laterality: Left;   TUBAL LIGATION  10/19/2010    SOCIAL HISTORY: Social History   Socioeconomic History   Marital status: Single    Spouse name: Not on file   Number of children: 3   Years of education: Not on file   Highest education level: High school graduate  Occupational History    Comment: home maker  Tobacco Use   Smoking status: Every Day    Types: Cigars    Last attempt to quit: 12/23/2021    Years since quitting: 0.9   Smokeless tobacco: Never   Tobacco comments:    Not smoking as of 12/23/21. Verified 01/15/22 LW  Vaping Use   Vaping status: Never Used  Substance and Sexual Activity   Alcohol use: Not Currently   Drug use: Not Currently    Comment: per patient stopped smoking marijuana Mar 2022   Sexual activity: Yes    Birth control/protection: Surgical  Other Topics Concern   Not on file  Social History Narrative   Lives with 3 children   Some coffee   Social Determinants of Health   Financial Resource Strain: Not on file  Food Insecurity: No Food Insecurity (05/13/2022)   Hunger Vital Sign    Worried About Running Out of Food in the Last Year: Never true    Ran Out of Food in the Last Year: Never true  Transportation Needs: No Transportation Needs (05/13/2022)   PRAPARE - Administrator, Civil Service (Medical): No    Lack of Transportation (Non-Medical): No  Physical Activity: Not on file  Stress: Not on file  Social Connections: Unknown (07/17/2021)   Received from [facility]   Social Network    Social Network: Not on file  Intimate Partner Violence: Not At Risk (05/13/2022)   Humiliation, Afraid, Rape, and Kick questionnaire    Fear of Current or Ex-Partner: No    Emotionally Abused: No     Physically Abused: No    Sexually Abused: No    FAMILY HISTORY: Family History  Problem Relation Age of Onset   Diabetes Mother    Hypertension Mother    Stroke Mother     ALLERGIES:  is allergic to trulicity [dulaglutide].  MEDICATIONS:  Current Outpatient Medications  Medication Sig Dispense Refill   amitriptyline (ELAVIL) 25 MG tablet Take 1 tablet (25 mg total) by mouth at bedtime. 30 tablet 5   cyanocobalamin (VITAMIN B12) 1000 MCG tablet Take 1 tablet (1,000 mcg total) by mouth daily. 90 tablet 1   empagliflozin (JARDIANCE) 25 MG TABS tablet Take 1 tablet (25 mg total) by mouth daily before breakfast. 90 tablet 3   gabapentin (NEURONTIN) 800 MG tablet Take 800 mg by mouth  in the morning, at noon, in the evening, and at bedtime.     Galcanezumab-gnlm (EMGALITY) 120 MG/ML SOAJ Inject 120 mg into the skin every 30 (thirty) days. 1.12 mL 11   glipiZIDE (GLUCOTROL) 10 MG tablet Take 2 tablets (20 mg total) by mouth daily before breakfast AND 1 tablet (10 mg total) daily before supper. 270 tablet 3   guaiFENesin-dextromethorphan (ROBITUSSIN DM) 100-10 MG/5ML syrup Take 5 mLs by mouth every 4 (four) hours as needed for cough. 118 mL 0   metroNIDAZOLE (FLAGYL) 500 MG tablet Take 500 mg by mouth 2 (two) times daily.     ondansetron (ZOFRAN-ODT) 4 MG disintegrating tablet Take 1 tablet (4 mg total) by mouth 3 (three) times daily as needed for nausea or vomiting. 30 tablet 2   oxyCODONE-acetaminophen (PERCOCET/ROXICET) 5-325 MG tablet Take 1 tablet by mouth 4 (four) times daily as needed for moderate pain or severe pain.     pantoprazole (PROTONIX) 40 MG tablet Take 1 tablet (40 mg total) by mouth daily. 30 tablet 0   prednisoLONE acetate (PRED FORTE) 1 % ophthalmic suspension Place 1 drop into the left eye 4 (four) times daily.     pregabalin (LYRICA) 150 MG capsule Take 150 mg by mouth 3 (three) times daily.     Semaglutide,0.25 or 0.5MG /DOS, 2 MG/3ML SOPN Inject 0.5 mg into the skin  once a week. 9 mL 3   SUMAtriptan (IMITREX) 50 MG tablet Take 1 tablet (50 mg total) by mouth every 2 (two) hours as needed for migraine. May repeat in 2 hours if headache persists or recurs. 10 tablet 5   tiZANidine (ZANAFLEX) 4 MG tablet Take 4 mg by mouth 2 (two) times daily.     vitamin C (ASCORBIC ACID) 250 MG tablet Take 2 tablets (500 mg total) by mouth daily. (Patient taking differently: Take 250 mg by mouth daily.) 180 tablet 0   No current facility-administered medications for this visit.     PHYSICAL EXAMINATION: ECOG PERFORMANCE STATUS: 2 - Symptomatic, <50% confined to bed Vitals:   12/18/22 1305  BP: (!) 135/90  Pulse: 99  Resp: 18  Temp: (!) 97.5 F (36.4 C)  SpO2: 100%   Filed Weights   12/18/22 1305  Weight: 198 lb 11.2 oz (90.1 kg)      Physical Exam Constitutional:      General: She is not in acute distress.    Comments: Patient sits in the wheelchair  HENT:     Head: Normocephalic and atraumatic.  Eyes:     General: No scleral icterus. Cardiovascular:     Rate and Rhythm: Normal rate and regular rhythm.     Heart sounds: Normal heart sounds.  Pulmonary:     Effort: Pulmonary effort is normal. No respiratory distress.     Breath sounds: No wheezing.  Abdominal:     General: Bowel sounds are normal. There is no distension.     Palpations: Abdomen is soft.  Musculoskeletal:        General: No deformity. Normal range of motion.     Cervical back: Normal range of motion and neck supple.  Skin:    General: Skin is warm and dry.  Neurological:     Mental Status: She is alert and oriented to person, place, and time. Mental status is at baseline.     Cranial Nerves: No cranial nerve deficit.     Coordination: Coordination normal.  Psychiatric:        Mood and Affect: Mood  normal.     LABORATORY DATA:  I have reviewed the data as listed    Latest Ref Rng & Units 12/17/2022   10:51 AM 08/09/2022    2:43 PM 05/30/2022   10:22 AM  CBC  WBC 4.0 -  10.5 K/uL 7.5  12.4  7.2   Hemoglobin 12.0 - 15.0 g/dL 52.8  41.3  24.4   Hematocrit 36.0 - 46.0 % 30.0  31.1  36.2   Platelets 150 - 400 K/uL 144  259  189       Latest Ref Rng & Units 05/18/2022    9:46 AM 05/15/2022    4:39 AM 05/14/2022   11:03 AM  CMP  Glucose 70 - 99 mg/dL  010    BUN 6 - 20 mg/dL  5    Creatinine 2.72 - 1.00 mg/dL  5.36    Sodium 644 - 034 mmol/L  137    Potassium 3.5 - 5.1 mmol/L  4.0    Chloride 98 - 111 mmol/L  109    CO2 22 - 32 mmol/L  22    Calcium 8.9 - 10.3 mg/dL  8.6    Total Protein 6.5 - 8.1 g/dL 7.1  6.2  6.1   Total Bilirubin 0.3 - 1.2 mg/dL 1.0  1.4  1.9   Alkaline Phos 38 - 126 U/L 100  93  99   AST 15 - 41 U/L 15  16  16    ALT 0 - 44 U/L 15  17  19       Iron/TIBC/Ferritin/ %Sat    Component Value Date/Time   IRON 54 12/17/2022 1051   IRON 64 01/02/2019 0955   TIBC 455 (H) 12/17/2022 1051   TIBC 382 01/02/2019 0955   FERRITIN 12 12/17/2022 1051   FERRITIN 21 01/02/2019 0955   IRONPCTSAT 12 12/17/2022 1051   IRONPCTSAT 17 01/02/2019 0955      RADIOGRAPHIC STUDIES: I have personally reviewed the radiological images as listed and agreed with the findings in the report. No results found.

## 2022-12-18 NOTE — Assessment & Plan Note (Signed)
S/p vitamin B12 injections, she did not tolerate due to muscle cramps.  Recommend oral B12 1021mcg daily.

## 2022-12-18 NOTE — Progress Notes (Signed)
Patient refused to stay for 30 minute observation period post Venofer infusion. Informed patient to go to local ED if she experience any signs of allergic reaction. Patient verbalized understanding.

## 2022-12-18 NOTE — Assessment & Plan Note (Signed)
Patient tolerates IV Venofer treatments previously. Labs reviewed and discussed with patient. Lab Results  Component Value Date   HGB 10.0 (L) 12/17/2022   TIBC 455 (H) 12/17/2022   IRONPCTSAT 12 12/17/2022   FERRITIN 12 12/17/2022     Recommend IV vnefoer x3.

## 2022-12-18 NOTE — Patient Instructions (Signed)
Iron Sucrose Injection What is this medication? IRON SUCROSE (EYE ern SOO krose) treats low levels of iron (iron deficiency anemia) in people with kidney disease. Iron is a mineral that plays an important role in making red blood cells, which carry oxygen from your lungs to the rest of your body. This medicine may be used for other purposes; ask your health care provider or pharmacist if you have questions. COMMON BRAND NAME(S): Venofer What should I tell my care team before I take this medication? They need to know if you have any of these conditions: Anemia not caused by low iron levels Heart disease High levels of iron in the blood Kidney disease Liver disease An unusual or allergic reaction to iron, other medications, foods, dyes, or preservatives Pregnant or trying to get pregnant Breastfeeding How should I use this medication? This medication is for infusion into a vein. It is given in a hospital or clinic setting. Talk to your care team about the use of this medication in children. While this medication may be prescribed for children as young as 2 years for selected conditions, precautions do apply. Overdosage: If you think you have taken too much of this medicine contact a poison control center or emergency room at once. NOTE: This medicine is only for you. Do not share this medicine with others. What if I miss a dose? Keep appointments for follow-up doses. It is important not to miss your dose. Call your care team if you are unable to keep an appointment. What may interact with this medication? Do not take this medication with any of the following: Deferoxamine Dimercaprol Other iron products This medication may also interact with the following: Chloramphenicol Deferasirox This list may not describe all possible interactions. Give your health care provider a list of all the medicines, herbs, non-prescription drugs, or dietary supplements you use. Also tell them if you smoke,  drink alcohol, or use illegal drugs. Some items may interact with your medicine. What should I watch for while using this medication? Visit your care team regularly. Tell your care team if your symptoms do not start to get better or if they get worse. You may need blood work done while you are taking this medication. You may need to follow a special diet. Talk to your care team. Foods that contain iron include: whole grains/cereals, dried fruits, beans, or peas, leafy green vegetables, and organ meats (liver, kidney). What side effects may I notice from receiving this medication? Side effects that you should report to your care team as soon as possible: Allergic reactions--skin rash, itching, hives, swelling of the face, lips, tongue, or throat Low blood pressure--dizziness, feeling faint or lightheaded, blurry vision Shortness of breath Side effects that usually do not require medical attention (report to your care team if they continue or are bothersome): Flushing Headache Joint pain Muscle pain Nausea Pain, redness, or irritation at injection site This list may not describe all possible side effects. Call your doctor for medical advice about side effects. You may report side effects to FDA at 1-800-FDA-1088. Where should I keep my medication? This medication is given in a hospital or clinic. It will not be stored at home. NOTE: This sheet is a summary. It may not cover all possible information. If you have questions about this medicine, talk to your doctor, pharmacist, or health care provider.  2024 Elsevier/Gold Standard (2022-08-10 00:00:00)

## 2022-12-21 ENCOUNTER — Other Ambulatory Visit: Payer: Self-pay

## 2022-12-21 ENCOUNTER — Emergency Department (HOSPITAL_COMMUNITY)
Admission: EM | Admit: 2022-12-21 | Discharge: 2022-12-22 | Disposition: A | Discharge status: Home / Self Care | Payer: Medicaid Other | Attending: Emergency Medicine | Admitting: Emergency Medicine

## 2022-12-21 DIAGNOSIS — R739 Hyperglycemia, unspecified: Secondary | ICD-10-CM

## 2022-12-21 DIAGNOSIS — E871 Hypo-osmolality and hyponatremia: Secondary | ICD-10-CM | POA: Insufficient documentation

## 2022-12-21 DIAGNOSIS — E1165 Type 2 diabetes mellitus with hyperglycemia: Secondary | ICD-10-CM | POA: Insufficient documentation

## 2022-12-21 DIAGNOSIS — R42 Dizziness and giddiness: Secondary | ICD-10-CM | POA: Insufficient documentation

## 2022-12-21 LAB — CBC
HCT: 34.8 % — ABNORMAL LOW (ref 36.0–46.0)
Hemoglobin: 12.1 g/dL (ref 12.0–15.0)
MCH: 25.7 pg — ABNORMAL LOW (ref 26.0–34.0)
MCHC: 34.8 g/dL (ref 30.0–36.0)
MCV: 74 fL — ABNORMAL LOW (ref 80.0–100.0)
Platelets: 190 10*3/uL (ref 150–400)
RBC: 4.7 MIL/uL (ref 3.87–5.11)
RDW: 21 % — ABNORMAL HIGH (ref 11.5–15.5)
WBC: 11.2 10*3/uL — ABNORMAL HIGH (ref 4.0–10.5)
nRBC: 0 % (ref 0.0–0.2)

## 2022-12-21 LAB — BASIC METABOLIC PANEL
Anion gap: 9 (ref 5–15)
BUN: 18 mg/dL (ref 6–20)
CO2: 22 mmol/L (ref 22–32)
Calcium: 8.6 mg/dL — ABNORMAL LOW (ref 8.9–10.3)
Chloride: 98 mmol/L (ref 98–111)
Creatinine, Ser: 0.97 mg/dL (ref 0.44–1.00)
GFR, Estimated: >60 mL/min (ref >60)
Glucose, Bld: 551 mg/dL (ref 70–99)
Potassium: 4.4 mmol/L (ref 3.5–5.1)
Sodium: 129 mmol/L — ABNORMAL LOW (ref 135–145)

## 2022-12-21 LAB — URINALYSIS, ROUTINE W REFLEX MICROSCOPIC
Bacteria, UA: NONE SEEN
Bilirubin Urine: NEGATIVE
Glucose, UA: >=500 mg/dL — AB
Ketones, ur: NEGATIVE mg/dL
Nitrite: NEGATIVE
Protein, ur: NEGATIVE mg/dL
Specific Gravity, Urine: 1.024 (ref 1.005–1.030)
pH: 5 (ref 5.0–8.0)

## 2022-12-21 LAB — CBG MONITORING, ED: Glucose-Capillary: 456 mg/dL — ABNORMAL HIGH (ref 70–99)

## 2022-12-21 LAB — HCG, SERUM, QUALITATIVE: Preg, Serum: NEGATIVE

## 2022-12-21 NOTE — ED Triage Notes (Signed)
Patient here from home reporting dizziness x2 days with weakness. Hx of anemia.

## 2022-12-22 LAB — CBG MONITORING, ED
Glucose-Capillary: 467 mg/dL — ABNORMAL HIGH (ref 70–99)
Glucose-Capillary: 354 mg/dL — ABNORMAL HIGH (ref 70–99)
Glucose-Capillary: 309 mg/dL — ABNORMAL HIGH (ref 70–99)

## 2022-12-22 MED ORDER — INSULIN ASPART 100 UNIT/ML IV SOLN
5.0000 [IU] | Freq: Once | INTRAVENOUS | Status: AC
  Administered 2022-12-22: 5 [IU] via INTRAVENOUS
  Filled 2022-12-22: qty 0.05

## 2022-12-22 MED ORDER — ONDANSETRON 4 MG PO TBDP: 4.0000 mg | ORAL_TABLET | Freq: Three times a day (TID) | ORAL | 2 refills | Status: DC | PRN

## 2022-12-22 MED ORDER — SODIUM CHLORIDE 0.9 % IV BOLUS
1000.0000 mL | Freq: Once | INTRAVENOUS | Status: AC
  Administered 2022-12-22: 1000 mL via INTRAVENOUS

## 2022-12-22 NOTE — Discharge Instructions (Signed)
Please work with your endocrinologist to adjust your medications for your diabetes.  Return if you have any new or concerning symptoms.

## 2022-12-22 NOTE — ED Notes (Addendum)
12:27 AM  Patient reports dizziness, frontal HA, faintness, bilateral blurry vision and nausea constantly x 3 days. She reports that her "body feel heavy." Denies fevers, CP, SOB, urinary sx, abdominal pain. She is currently rating pain 9/10 on pain scale with pain mainly at the head. She is alert and oriented x 4. She has equal rise and fall of the chest wall with clear lung sounds. Hx diabetes and anemia. Pending lab results. Call light in reach. Bed in lowest position. Pending ER provider assessment and orders.   1:33 AM  Patient resting in stretcher with even respirations. She denies any needs at this time. Call light in reach. Bed in lowest position. Plan of care ongoing.   2:09 AM  Provider at bedside.   2:24 AM  Provider at bedside.   3:11 AM  Patient given ice chips. She denies any further needs at this time. Call light in reach. Bed in lowest position. Plan of care ongoing.   3:40 AM  Patient ambulated to restroom.

## 2022-12-22 NOTE — ED Provider Notes (Signed)
[place] EMERGENCY DEPARTMENT AT [facility] Provider Note   CSN: [id number] Arrival date & time: 12/21/22  2220     History  Chief Complaint  Patient presents with   Dizziness    Melinda Stone is a 42 y.o. female.  The history is provided by the patient.  Dizziness She has history of diabetes and comes in stating that she has had some dizziness for the last 3 days.  She states that her head feels heavy and dizziness is worse when she stands up, better if she lays down.  Her vision has been blurred but this has been constant not just when she is dizzy.  She states her whole body feels heavy.  During this time, she has not had any appetite but she has continued to take her medications.  She states blood sugars have been approximately 200 when taken in the morning.  She does admit to urinary frequency.  She denies fever or chills.  She denies nausea or vomiting.  She denies any focal weakness.   Home Medications Prior to Admission medications   Medication Sig Start Date End Date Taking? Authorizing Provider  amitriptyline (ELAVIL) 25 MG tablet Take 1 tablet (25 mg total) by mouth at bedtime. 07/11/22   Glean Salvo, NP  cyanocobalamin (VITAMIN B12) 1000 MCG tablet Take 1 tablet (1,000 mcg total) by mouth daily. 04/10/22   Rickard Patience, MD  empagliflozin (JARDIANCE) 25 MG TABS tablet Take 1 tablet (25 mg total) by mouth daily before breakfast. 10/16/22   Shamleffer, Konrad Dolores, MD  gabapentin (NEURONTIN) 800 MG tablet Take 800 mg by mouth in the morning, at noon, in the evening, and at bedtime. 04/04/21   [provider]  Galcanezumab-gnlm (EMGALITY) 120 MG/ML SOAJ Inject 120 mg into the skin every 30 (thirty) days. 07/11/22   Glean Salvo, NP  glipiZIDE (GLUCOTROL) 10 MG tablet Take 2 tablets (20 mg total) by mouth daily before breakfast AND 1 tablet (10 mg total) daily before supper. 10/16/22   Shamleffer, Konrad Dolores, MD  guaiFENesin-dextromethorphan  (ROBITUSSIN DM) 100-10 MG/5ML syrup Take 5 mLs by mouth every 4 (four) hours as needed for cough. 05/07/22   Rhetta Mura, MD  metroNIDAZOLE (FLAGYL) 500 MG tablet Take 500 mg by mouth 2 (two) times daily. 05/23/22   [provider]  ondansetron (ZOFRAN-ODT) 4 MG disintegrating tablet Take 1 tablet (4 mg total) by mouth 3 (three) times daily as needed for nausea or vomiting. 05/15/22   Danford, Earl Lites, MD  oxyCODONE-acetaminophen (PERCOCET/ROXICET) 5-325 MG tablet Take 1 tablet by mouth 4 (four) times daily as needed for moderate pain or severe pain. 03/13/21   [provider]  pantoprazole (PROTONIX) 40 MG tablet Take 1 tablet (40 mg total) by mouth daily. 06/12/21   Alwyn Ren, MD  prednisoLONE acetate (PRED FORTE) 1 % ophthalmic suspension Place 1 drop into the left eye 4 (four) times daily. 04/26/22   [provider]  pregabalin (LYRICA) 150 MG capsule Take 150 mg by mouth 3 (three) times daily. 10/30/22   [provider]  Semaglutide,0.25 or 0.5MG /DOS, 2 MG/3ML SOPN Inject 0.5 mg into the skin once a week. 10/16/22   Shamleffer, Konrad Dolores, MD  SUMAtriptan (IMITREX) 50 MG tablet Take 1 tablet (50 mg total) by mouth every 2 (two) hours as needed for migraine. May repeat in 2 hours if headache persists or recurs. 07/11/22   Glean Salvo, NP  tiZANidine (ZANAFLEX) 4 MG tablet Take  4 mg by mouth 2 (two) times daily. 04/26/22   [provider]  vitamin C (ASCORBIC ACID) 250 MG tablet Take 2 tablets (500 mg total) by mouth daily. Patient taking differently: Take 250 mg by mouth daily. 04/10/22   Rickard Patience, MD      Allergies    Trulicity [dulaglutide]    Review of Systems   Review of Systems  Neurological:  Positive for dizziness.  All other systems reviewed and are negative.   Physical Exam Updated Vital Signs BP 113/74 (BP Location: Left Arm) Comment (BP Location): Simultaneous filing. User may not have seen previous data.   Pulse (!) 105 Comment: Simultaneous filing. User may not have seen previous data.  Temp 98 F (36.7 C) (Oral) Comment: Simultaneous filing. User may not have seen previous data. Comment (Src): Simultaneous filing. User may not have seen previous data.  Resp 15   Ht 5\' 9"  (1.753 m)   Wt 89.6 kg   SpO2 100% Comment: Simultaneous filing. User may not have seen previous data.  BMI 29.18 kg/m  Physical Exam Vitals and nursing note reviewed.   42 year old female, resting comfortably and in no acute distress. Vital signs are normal. Oxygen saturation is 100%, which is normal. Head is normocephalic and atraumatic. PERRLA, EOMI. Oropharynx is clear. Neck is nontender and supple without adenopathy or JVD.  There are no carotid bruits. Back is nontender and there is no CVA tenderness. Lungs are clear without rales, wheezes, or rhonchi. Chest is nontender. Heart has regular rate and rhythm without murmur. Abdomen is soft, flat, nontender. Extremities have no cyanosis or edema, full range of motion is present. Skin is warm and dry without rash. Neurologic: Mental status is normal, cranial nerves are intact, moves all extremities equally.  There is no nystagmus.  Dizziness is not reproduced by passive head movement.  ED Results / Procedures / Treatments   Labs (all labs ordered are listed, but only abnormal results are displayed) Labs Reviewed  BASIC METABOLIC PANEL - Abnormal; Notable for the following components:      Result Value   Sodium 129 (*)    Glucose, Bld 551 (*)    Calcium 8.6 (*)    All other components within normal limits  CBC - Abnormal; Notable for the following components:   WBC 11.2 (*)    HCT 34.8 (*)    MCV 74.0 (*)    MCH 25.7 (*)    RDW 21.0 (*)    All other components within normal limits  URINALYSIS, ROUTINE W REFLEX MICROSCOPIC - Abnormal; Notable for the following components:   Glucose, UA >=500 (*)    Hgb urine dipstick SMALL (*)    Leukocytes,Ua TRACE (*)     All other components within normal limits  CBG MONITORING, ED - Abnormal; Notable for the following components:   Glucose-Capillary 456 (*)    All other components within normal limits  CBG MONITORING, ED - Abnormal; Notable for the following components:   Glucose-Capillary 467 (*)    All other components within normal limits  HCG, SERUM, QUALITATIVE    EKG EKG Interpretation Date/Time:    Ventricular Rate:  103 PR Interval:  178 QRS Duration:  75 QT Interval:  345 QTC Calculation:   R Axis:      Text Interpretation:    Radiology No results found.  Procedures Procedures    Medications Ordered in ED Medications  sodium chloride 0.9 % bolus 1,000 mL (has no administration in  time range)  insulin aspart (novoLOG) injection 5 Units (has no administration in time range)    ED Course/ Medical Decision Making/ A&P                                 Medical Decision Making Amount and/or Complexity of Data Reviewed Labs: ordered.  Risk OTC drugs. Prescription drug management.   Orthostatic dizziness suggesting possible dehydration.  No findings to suggest vertigo.  I have reviewed her laboratory test, and my interpretation is moderate to severe hyperglycemia without evidence of ketoacidosis, hyponatremia appropriate to degree of hyperglycemia, mild leukocytosis which is nonspecific, normal hemoglobin which is actually increased over baseline possibly consistent with dehydration, urine significant for glucose but no significant pyuria or hematuria.  Blood pressure is normal, but significantly lower than what she has had in prior ED visits.  I have ordered IV fluids and intravenous insulin and will reassess following administration of above-noted medications.  She has noted some improvement with above-noted treatment.  Blood glucose is down to 309 which is acceptable.  I am discharging her with prescription for ondansetron oral dissolving tablet so that she can maintain her oral  intake at home.  I have instructed her to follow-up with her endocrinologist to make adjustments in her diabetic medication regimen.  Final Clinical Impression(s) / ED Diagnoses Final diagnoses:  Hyperglycemia  Lightheadedness  Hyponatremia    Rx / DC Orders ED Discharge Orders     None         Dione Booze, MD 12/22/22 [phone]

## 2022-12-24 ENCOUNTER — Emergency Department (HOSPITAL_COMMUNITY): Payer: Medicaid Other

## 2022-12-24 ENCOUNTER — Encounter (HOSPITAL_COMMUNITY): Payer: Self-pay

## 2022-12-24 ENCOUNTER — Inpatient Hospital Stay (HOSPITAL_COMMUNITY)
Admission: EM | Admit: 2022-12-24 | Discharge: 2022-12-27 | DRG: 638 | Disposition: A | Discharge status: Home / Self Care | Payer: Medicaid Other | Attending: Internal Medicine | Admitting: Internal Medicine

## 2022-12-24 ENCOUNTER — Other Ambulatory Visit: Payer: Self-pay

## 2022-12-24 DIAGNOSIS — J069 Acute upper respiratory infection, unspecified: Secondary | ICD-10-CM | POA: Diagnosis present

## 2022-12-24 DIAGNOSIS — F17290 Nicotine dependence, other tobacco product, uncomplicated: Secondary | ICD-10-CM | POA: Diagnosis present

## 2022-12-24 DIAGNOSIS — D509 Iron deficiency anemia, unspecified: Secondary | ICD-10-CM | POA: Diagnosis present

## 2022-12-24 DIAGNOSIS — M47816 Spondylosis without myelopathy or radiculopathy, lumbar region: Secondary | ICD-10-CM | POA: Diagnosis present

## 2022-12-24 DIAGNOSIS — Z888 Allergy status to other drugs, medicaments and biological substances status: Secondary | ICD-10-CM | POA: Diagnosis not present

## 2022-12-24 DIAGNOSIS — M545 Low back pain, unspecified: Secondary | ICD-10-CM | POA: Diagnosis present

## 2022-12-24 DIAGNOSIS — G43909 Migraine, unspecified, not intractable, without status migrainosus: Secondary | ICD-10-CM | POA: Diagnosis present

## 2022-12-24 DIAGNOSIS — Z833 Family history of diabetes mellitus: Secondary | ICD-10-CM

## 2022-12-24 DIAGNOSIS — Z7984 Long term (current) use of oral hypoglycemic drugs: Secondary | ICD-10-CM

## 2022-12-24 DIAGNOSIS — Z79899 Other long term (current) drug therapy: Secondary | ICD-10-CM

## 2022-12-24 DIAGNOSIS — K219 Gastro-esophageal reflux disease without esophagitis: Secondary | ICD-10-CM | POA: Diagnosis present

## 2022-12-24 DIAGNOSIS — N83202 Unspecified ovarian cyst, left side: Secondary | ICD-10-CM | POA: Diagnosis present

## 2022-12-24 DIAGNOSIS — E538 Deficiency of other specified B group vitamins: Secondary | ICD-10-CM | POA: Diagnosis present

## 2022-12-24 DIAGNOSIS — E559 Vitamin D deficiency, unspecified: Secondary | ICD-10-CM | POA: Diagnosis present

## 2022-12-24 DIAGNOSIS — G8929 Other chronic pain: Secondary | ICD-10-CM | POA: Diagnosis present

## 2022-12-24 DIAGNOSIS — N179 Acute kidney failure, unspecified: Secondary | ICD-10-CM | POA: Diagnosis present

## 2022-12-24 DIAGNOSIS — Z8249 Family history of ischemic heart disease and other diseases of the circulatory system: Secondary | ICD-10-CM | POA: Diagnosis not present

## 2022-12-24 DIAGNOSIS — D259 Leiomyoma of uterus, unspecified: Secondary | ICD-10-CM | POA: Diagnosis present

## 2022-12-24 DIAGNOSIS — Z823 Family history of stroke: Secondary | ICD-10-CM | POA: Diagnosis not present

## 2022-12-24 DIAGNOSIS — E1110 Type 2 diabetes mellitus with ketoacidosis without coma: Principal | ICD-10-CM | POA: Diagnosis present

## 2022-12-24 DIAGNOSIS — Z1152 Encounter for screening for COVID-19: Secondary | ICD-10-CM | POA: Diagnosis not present

## 2022-12-24 DIAGNOSIS — E1142 Type 2 diabetes mellitus with diabetic polyneuropathy: Secondary | ICD-10-CM | POA: Diagnosis present

## 2022-12-24 DIAGNOSIS — A084 Viral intestinal infection, unspecified: Secondary | ICD-10-CM | POA: Diagnosis present

## 2022-12-24 DIAGNOSIS — G1220 Motor neuron disease, unspecified: Secondary | ICD-10-CM | POA: Diagnosis present

## 2022-12-24 LAB — CBG MONITORING, ED
Glucose-Capillary: 280 mg/dL — ABNORMAL HIGH (ref 70–99)
Glucose-Capillary: 189 mg/dL — ABNORMAL HIGH (ref 70–99)
Glucose-Capillary: 142 mg/dL — ABNORMAL HIGH (ref 70–99)
Glucose-Capillary: 113 mg/dL — ABNORMAL HIGH (ref 70–99)
Glucose-Capillary: 107 mg/dL — ABNORMAL HIGH (ref 70–99)
Glucose-Capillary: 142 mg/dL — ABNORMAL HIGH (ref 70–99)
Glucose-Capillary: 151 mg/dL — ABNORMAL HIGH (ref 70–99)
Glucose-Capillary: 160 mg/dL — ABNORMAL HIGH (ref 70–99)
Glucose-Capillary: 193 mg/dL — ABNORMAL HIGH (ref 70–99)
Glucose-Capillary: 217 mg/dL — ABNORMAL HIGH (ref 70–99)
Glucose-Capillary: 155 mg/dL — ABNORMAL HIGH (ref 70–99)
Glucose-Capillary: 157 mg/dL — ABNORMAL HIGH (ref 70–99)

## 2022-12-24 LAB — CBC
HCT: 34.8 % — ABNORMAL LOW (ref 36.0–46.0)
Hemoglobin: 12 g/dL (ref 12.0–15.0)
MCH: 25.9 pg — ABNORMAL LOW (ref 26.0–34.0)
MCHC: 34.5 g/dL (ref 30.0–36.0)
MCV: 75 fL — ABNORMAL LOW (ref 80.0–100.0)
Platelets: 186 10*3/uL (ref 150–400)
RBC: 4.64 MIL/uL (ref 3.87–5.11)
RDW: 21.5 % — ABNORMAL HIGH (ref 11.5–15.5)
WBC: 10.9 10*3/uL — ABNORMAL HIGH (ref 4.0–10.5)
nRBC: 0 % (ref 0.0–0.2)

## 2022-12-24 LAB — TROPONIN I (HIGH SENSITIVITY)
Troponin I (High Sensitivity): 4 ng/L (ref <18)
Troponin I (High Sensitivity): 3 ng/L (ref <18)

## 2022-12-24 LAB — URINALYSIS, ROUTINE W REFLEX MICROSCOPIC
Bilirubin Urine: NEGATIVE
Glucose, UA: >=500 mg/dL — AB
Ketones, ur: 20 mg/dL — AB
Nitrite: NEGATIVE
Protein, ur: NEGATIVE mg/dL
Specific Gravity, Urine: 1.023 (ref 1.005–1.030)
pH: 5 (ref 5.0–8.0)

## 2022-12-24 LAB — BETA-HYDROXYBUTYRIC ACID: Beta-Hydroxybutyric Acid: 3.53 mmol/L — ABNORMAL HIGH (ref 0.05–0.27)

## 2022-12-24 LAB — BASIC METABOLIC PANEL
Anion gap: 12 (ref 5–15)
Anion gap: 11 (ref 5–15)
Anion gap: 14 (ref 5–15)
Anion gap: 9 (ref 5–15)
BUN: 16 mg/dL (ref 6–20)
BUN: 11 mg/dL (ref 6–20)
BUN: 12 mg/dL (ref 6–20)
BUN: 7 mg/dL (ref 6–20)
CO2: 18 mmol/L — ABNORMAL LOW (ref 22–32)
CO2: 19 mmol/L — ABNORMAL LOW (ref 22–32)
CO2: 19 mmol/L — ABNORMAL LOW (ref 22–32)
CO2: 20 mmol/L — ABNORMAL LOW (ref 22–32)
Calcium: 8.3 mg/dL — ABNORMAL LOW (ref 8.9–10.3)
Calcium: 8.2 mg/dL — ABNORMAL LOW (ref 8.9–10.3)
Calcium: 8.4 mg/dL — ABNORMAL LOW (ref 8.9–10.3)
Calcium: 8.3 mg/dL — ABNORMAL LOW (ref 8.9–10.3)
Chloride: 104 mmol/L (ref 98–111)
Chloride: 104 mmol/L (ref 98–111)
Chloride: 101 mmol/L (ref 98–111)
Chloride: 105 mmol/L (ref 98–111)
Creatinine, Ser: 0.89 mg/dL (ref 0.44–1.00)
Creatinine, Ser: 0.71 mg/dL (ref 0.44–1.00)
Creatinine, Ser: 0.82 mg/dL (ref 0.44–1.00)
Creatinine, Ser: 0.84 mg/dL (ref 0.44–1.00)
GFR, Estimated: >60 mL/min (ref >60)
GFR, Estimated: >60 mL/min (ref >60)
GFR, Estimated: >60 mL/min (ref >60)
GFR, Estimated: >60 mL/min (ref >60)
Glucose, Bld: 202 mg/dL — ABNORMAL HIGH (ref 70–99)
Glucose, Bld: 123 mg/dL — ABNORMAL HIGH (ref 70–99)
Glucose, Bld: 166 mg/dL — ABNORMAL HIGH (ref 70–99)
Glucose, Bld: 170 mg/dL — ABNORMAL HIGH (ref 70–99)
Potassium: 4.2 mmol/L (ref 3.5–5.1)
Potassium: 3.8 mmol/L (ref 3.5–5.1)
Potassium: 3.5 mmol/L (ref 3.5–5.1)
Potassium: 3.5 mmol/L (ref 3.5–5.1)
Sodium: 134 mmol/L — ABNORMAL LOW (ref 135–145)
Sodium: 134 mmol/L — ABNORMAL LOW (ref 135–145)
Sodium: 134 mmol/L — ABNORMAL LOW (ref 135–145)
Sodium: 134 mmol/L — ABNORMAL LOW (ref 135–145)

## 2022-12-24 LAB — COMPREHENSIVE METABOLIC PANEL
ALT: 11 U/L (ref 0–44)
AST: 13 U/L — ABNORMAL LOW (ref 15–41)
Albumin: 3.8 g/dL (ref 3.5–5.0)
Alkaline Phosphatase: 93 U/L (ref 38–126)
Anion gap: 17 — ABNORMAL HIGH (ref 5–15)
BUN: 16 mg/dL (ref 6–20)
CO2: 16 mmol/L — ABNORMAL LOW (ref 22–32)
Calcium: 8.8 mg/dL — ABNORMAL LOW (ref 8.9–10.3)
Chloride: 101 mmol/L (ref 98–111)
Creatinine, Ser: 1.02 mg/dL — ABNORMAL HIGH (ref 0.44–1.00)
GFR, Estimated: >60 mL/min (ref >60)
Glucose, Bld: 267 mg/dL — ABNORMAL HIGH (ref 70–99)
Potassium: 4.2 mmol/L (ref 3.5–5.1)
Sodium: 134 mmol/L — ABNORMAL LOW (ref 135–145)
Total Bilirubin: 2.1 mg/dL — ABNORMAL HIGH (ref 0.3–1.2)
Total Protein: 7 g/dL (ref 6.5–8.1)

## 2022-12-24 LAB — RAPID URINE DRUG SCREEN, HOSP PERFORMED
Amphetamines: NOT DETECTED
Barbiturates: NOT DETECTED
Benzodiazepines: NOT DETECTED
Cocaine: NOT DETECTED
Opiates: NOT DETECTED
Tetrahydrocannabinol: NOT DETECTED

## 2022-12-24 LAB — LIPASE, BLOOD: Lipase: 44 U/L (ref 11–51)

## 2022-12-24 LAB — RESP PANEL BY RT-PCR (RSV, FLU A&B, COVID)  RVPGX2
Influenza A by PCR: NEGATIVE
Influenza B by PCR: NEGATIVE
Resp Syncytial Virus by PCR: NEGATIVE
SARS Coronavirus 2 by RT PCR: NEGATIVE

## 2022-12-24 LAB — I-STAT VENOUS BLOOD GAS, ED
Acid-base deficit: 9 mmol/L — ABNORMAL HIGH (ref 0.0–2.0)
Bicarbonate: 16.4 mmol/L — ABNORMAL LOW (ref 20.0–28.0)
Calcium, Ion: 1.14 mmol/L — ABNORMAL LOW (ref 1.15–1.40)
HCT: 33 % — ABNORMAL LOW (ref 36.0–46.0)
Hemoglobin: 11.2 g/dL — ABNORMAL LOW (ref 12.0–15.0)
O2 Saturation: 72 %
Potassium: 4.4 mmol/L (ref 3.5–5.1)
Sodium: 135 mmol/L (ref 135–145)
TCO2: 17 mmol/L — ABNORMAL LOW (ref 22–32)
pCO2, Ven: 32.7 mm[Hg] — ABNORMAL LOW (ref 44–60)
pH, Ven: 7.308 (ref 7.25–7.43)
pO2, Ven: 41 mm[Hg] (ref 32–45)

## 2022-12-24 LAB — MAGNESIUM: Magnesium: 2 mg/dL (ref 1.7–2.4)

## 2022-12-24 LAB — HCG, SERUM, QUALITATIVE: Preg, Serum: NEGATIVE

## 2022-12-24 LAB — PHOSPHORUS: Phosphorus: 3 mg/dL (ref 2.5–4.6)

## 2022-12-24 MED ORDER — DEXTROSE 50 % IV SOLN: 0.0000 mL | INTRAVENOUS | Status: DC | PRN

## 2022-12-24 MED ORDER — ONDANSETRON HCL 4 MG PO TABS: 4.0000 mg | ORAL_TABLET | Freq: Four times a day (QID) | ORAL | Status: DC | PRN

## 2022-12-24 MED ORDER — ENOXAPARIN SODIUM 40 MG/0.4ML IJ SOSY
40.0000 mg | PREFILLED_SYRINGE | INTRAMUSCULAR | Status: DC
  Administered 2022-12-24 – 2022-12-26 (×3): 40 mg via SUBCUTANEOUS
  Filled 2022-12-24 (×3): qty 0.4

## 2022-12-24 MED ORDER — OXYCODONE-ACETAMINOPHEN 5-325 MG PO TABS
1.0000 | ORAL_TABLET | Freq: Four times a day (QID) | ORAL | Status: DC | PRN
  Administered 2022-12-24 – 2022-12-27 (×7): 1 via ORAL
  Filled 2022-12-24 (×7): qty 1

## 2022-12-24 MED ORDER — GABAPENTIN 400 MG PO CAPS
800.0000 mg | ORAL_CAPSULE | Freq: Four times a day (QID) | ORAL | Status: DC
  Administered 2022-12-24 – 2022-12-27 (×12): 800 mg via ORAL
  Filled 2022-12-24: qty 8
  Filled 2022-12-24 (×6): qty 2
  Filled 2022-12-24: qty 8
  Filled 2022-12-24 (×4): qty 2

## 2022-12-24 MED ORDER — ACETAMINOPHEN 325 MG PO TABS: 650.0000 mg | ORAL_TABLET | Freq: Four times a day (QID) | ORAL | Status: DC | PRN

## 2022-12-24 MED ORDER — DEXTROSE IN LACTATED RINGERS 5 % IV SOLN: INTRAVENOUS | Status: DC

## 2022-12-24 MED ORDER — ALUM & MAG HYDROXIDE-SIMETH 200-200-20 MG/5ML PO SUSP: 15.0000 mL | Freq: Four times a day (QID) | ORAL | Status: DC | PRN

## 2022-12-24 MED ORDER — ONDANSETRON HCL 4 MG/2ML IJ SOLN
4.0000 mg | Freq: Four times a day (QID) | INTRAMUSCULAR | Status: DC | PRN
  Administered 2022-12-24: 4 mg via INTRAVENOUS
  Filled 2022-12-24: qty 2

## 2022-12-24 MED ORDER — SODIUM CHLORIDE 0.9 % IV SOLN: 250.0000 mL | INTRAVENOUS | Status: DC | PRN

## 2022-12-24 MED ORDER — SODIUM CHLORIDE 0.9% FLUSH
3.0000 mL | Freq: Two times a day (BID) | INTRAVENOUS | Status: DC
  Administered 2022-12-25: 3 mL via INTRAVENOUS

## 2022-12-24 MED ORDER — TIZANIDINE HCL 4 MG PO TABS: 4.0000 mg | ORAL_TABLET | ORAL | Status: DC | PRN

## 2022-12-24 MED ORDER — SODIUM CHLORIDE 0.9% FLUSH: 3.0000 mL | INTRAVENOUS | Status: DC | PRN

## 2022-12-24 MED ORDER — ALBUTEROL SULFATE (2.5 MG/3ML) 0.083% IN NEBU: 2.5000 mg | INHALATION_SOLUTION | RESPIRATORY_TRACT | Status: DC | PRN

## 2022-12-24 MED ORDER — POTASSIUM CHLORIDE 10 MEQ/100ML IV SOLN
10.0000 meq | INTRAVENOUS | Status: AC
  Administered 2022-12-24 (×2): 10 meq via INTRAVENOUS
  Filled 2022-12-24 (×2): qty 100

## 2022-12-24 MED ORDER — PANTOPRAZOLE SODIUM 40 MG PO TBEC
40.0000 mg | DELAYED_RELEASE_TABLET | Freq: Every day | ORAL | Status: DC
  Administered 2022-12-24 – 2022-12-27 (×4): 40 mg via ORAL
  Filled 2022-12-24 (×5): qty 1

## 2022-12-24 MED ORDER — SUMATRIPTAN SUCCINATE 50 MG PO TABS: 50.0000 mg | ORAL_TABLET | ORAL | Status: DC | PRN

## 2022-12-24 MED ORDER — LACTATED RINGERS IV BOLUS
20.0000 mL/kg | Freq: Once | INTRAVENOUS | Status: AC
  Administered 2022-12-24: 1788 mL via INTRAVENOUS

## 2022-12-24 MED ORDER — LACTATED RINGERS IV SOLN: INTRAVENOUS | Status: DC

## 2022-12-24 MED ORDER — LACTATED RINGERS IV BOLUS
1000.0000 mL | Freq: Once | INTRAVENOUS | Status: AC
  Administered 2022-12-24: 1000 mL via INTRAVENOUS

## 2022-12-24 MED ORDER — INSULIN REGULAR(HUMAN) IN NACL 100-0.9 UT/100ML-% IV SOLN
INTRAVENOUS | Status: DC
  Administered 2022-12-24: 7 [IU]/h via INTRAVENOUS
  Filled 2022-12-24: qty 100

## 2022-12-24 MED ORDER — LORATADINE 10 MG PO TABS: 10.0000 mg | ORAL_TABLET | Freq: Every day | ORAL | Status: DC

## 2022-12-24 MED ORDER — GUAIFENESIN-DM 100-10 MG/5ML PO SYRP
5.0000 mL | ORAL_SOLUTION | ORAL | Status: DC | PRN
  Administered 2022-12-24 – 2022-12-27 (×6): 5 mL via ORAL
  Filled 2022-12-24 (×7): qty 5

## 2022-12-24 MED ORDER — DIPHENHYDRAMINE HCL 25 MG PO CAPS
25.0000 mg | ORAL_CAPSULE | Freq: Three times a day (TID) | ORAL | Status: DC | PRN
  Administered 2022-12-25 – 2022-12-26 (×4): 25 mg via ORAL
  Filled 2022-12-24 (×4): qty 1

## 2022-12-24 MED ORDER — CALCIUM CARBONATE ANTACID 500 MG PO CHEW
1.0000 | CHEWABLE_TABLET | Freq: Three times a day (TID) | ORAL | Status: DC | PRN
  Administered 2022-12-24: 200 mg via ORAL
  Filled 2022-12-24: qty 1

## 2022-12-24 MED ORDER — LORATADINE 10 MG PO TABS
10.0000 mg | ORAL_TABLET | Freq: Every day | ORAL | Status: DC
  Administered 2022-12-24 – 2022-12-27 (×4): 10 mg via ORAL
  Filled 2022-12-24 (×4): qty 1

## 2022-12-24 MED ORDER — ACETAMINOPHEN 650 MG RE SUPP: 650.0000 mg | Freq: Four times a day (QID) | RECTAL | Status: DC | PRN

## 2022-12-24 MED ORDER — AMITRIPTYLINE HCL 10 MG PO TABS
10.0000 mg | ORAL_TABLET | Freq: Every day | ORAL | Status: DC
  Administered 2022-12-24 – 2022-12-26 (×3): 10 mg via ORAL
  Filled 2022-12-24 (×3): qty 1

## 2022-12-24 MED ORDER — ONDANSETRON 4 MG PO TBDP
4.0000 mg | ORAL_TABLET | Freq: Once | ORAL | Status: AC | PRN
  Administered 2022-12-24: 4 mg via ORAL
  Filled 2022-12-24: qty 1

## 2022-12-24 MED ORDER — IOHEXOL 350 MG/ML SOLN
75.0000 mL | Freq: Once | INTRAVENOUS | Status: AC | PRN
  Administered 2022-12-24: 75 mL via INTRAVENOUS

## 2022-12-24 NOTE — ED Provider Notes (Signed)
[place] EMERGENCY DEPARTMENT AT [facility] Provider Note   CSN: [id number] Arrival date & time: 12/24/22  4098     History Chief Complaint  Patient presents with   Abdominal Pain    Melinda Stone is a 42 y.o. female with h/o DM, anemia presents to the ER for evaluation of multiple symptoms. The patient reports that she has been having runny nose and nasal congestion with occasional dry cough for the past 5-7 days. She denies any fevers. She reports that last night she had 3 episodes of nonbilious nonbloody emesis.  She reports that she has chronic diarrhea that is been unchanged.  Denies any melena or hematochezia.  Reports some generalized abdominal cramping sensation.  Denies any dysuria, hematuria, or any vaginal symptoms.  She reports generalized bodyaches and fatigue.  Denies any unilateral weakness.  Denies any fevers.  Reports that she does feel like she is having some chest pain.  Denies any shortness of breath.  She reports this has been going on for the week as well.  She was seen recently in the emergency department for lightheadedness and nausea.  She reports that she tried Zofran at home however is still experiencing some nausea.  She does not feel any better with her cough or cold symptoms.  She reports that she is on Jardiance, glipizide, and Ozempic.  Denies any allergies to any medications.  Reports daily cigar use but denies any EtOH or illicit drug use.   Abdominal Pain Associated symptoms: chest pain, diarrhea, fatigue, nausea and vomiting   Associated symptoms: no chills, no constipation, no dysuria, no fever, no hematuria, no shortness of breath, no sore throat, no vaginal bleeding and no vaginal discharge        Home Medications Prior to Admission medications   Medication Sig Start Date End Date Taking? Authorizing Provider  amitriptyline (ELAVIL) 10 MG tablet Take 10 mg by mouth. 12/04/22   [provider]  cyanocobalamin (VITAMIN  B12) 1000 MCG tablet Take 1 tablet (1,000 mcg total) by mouth daily. 04/10/22   Rickard Patience, MD  empagliflozin (JARDIANCE) 25 MG TABS tablet Take 1 tablet (25 mg total) by mouth daily before breakfast. 10/16/22   Shamleffer, Konrad Dolores, MD  gabapentin (NEURONTIN) 800 MG tablet Take 800 mg by mouth in the morning, at noon, in the evening, and at bedtime. 04/04/21   [provider]  Galcanezumab-gnlm (EMGALITY) 120 MG/ML SOAJ Inject 120 mg into the skin every 30 (thirty) days. 07/11/22   Glean Salvo, NP  glipiZIDE (GLUCOTROL) 10 MG tablet Take 2 tablets (20 mg total) by mouth daily before breakfast AND 1 tablet (10 mg total) daily before supper. 10/16/22   Shamleffer, Konrad Dolores, MD  guaiFENesin-dextromethorphan (ROBITUSSIN DM) 100-10 MG/5ML syrup Take 5 mLs by mouth every 4 (four) hours as needed for cough. Patient not taking: Reported on 12/22/2022 05/07/22   Rhetta Mura, MD  ondansetron (ZOFRAN-ODT) 4 MG disintegrating tablet Take 1 tablet (4 mg total) by mouth every 8 (eight) hours as needed for nausea or vomiting. 12/22/22   Dione Booze, MD  oxyCODONE-acetaminophen (PERCOCET/ROXICET) 5-325 MG tablet Take 1 tablet by mouth 4 (four) times daily as needed for moderate pain or severe pain. 03/13/21   [provider]  pantoprazole (PROTONIX) 40 MG tablet Take 1 tablet (40 mg total) by mouth daily. 06/12/21   Alwyn Ren, MD  prednisoLONE acetate (PRED FORTE) 1 % ophthalmic suspension Place 1 drop into the left eye 4 (four) times  daily. Patient not taking: Reported on 12/22/2022 04/26/22   [provider]  pregabalin (LYRICA) 150 MG capsule Take 150 mg by mouth 3 (three) times daily. Patient not taking: Reported on 12/22/2022 10/30/22   [provider]  Semaglutide,0.25 or 0.5MG /DOS, 2 MG/3ML SOPN Inject 0.5 mg into the skin once a week. 10/16/22   Shamleffer, Konrad Dolores, MD  SUMAtriptan (IMITREX) 50 MG tablet Take 1 tablet (50 mg total) by mouth  every 2 (two) hours as needed for migraine. May repeat in 2 hours if headache persists or recurs. 07/11/22   Glean Salvo, NP  tiZANidine (ZANAFLEX) 4 MG tablet Take 4 mg by mouth as needed for muscle spasms. 04/26/22   [provider]  vitamin C (ASCORBIC ACID) 250 MG tablet Take 2 tablets (500 mg total) by mouth daily. Patient taking differently: Take 250 mg by mouth daily. 04/10/22   Rickard Patience, MD      Allergies    Trulicity [dulaglutide]    Review of Systems   Review of Systems  Constitutional:  Positive for fatigue. Negative for chills and fever.  HENT:  Positive for congestion and rhinorrhea. Negative for sore throat.   Respiratory:  Negative for shortness of breath.   Cardiovascular:  Positive for chest pain. Negative for palpitations and leg swelling.  Gastrointestinal:  Positive for abdominal pain, diarrhea, nausea and vomiting. Negative for blood in stool and constipation.  Genitourinary:  Negative for dysuria, hematuria, vaginal bleeding, vaginal discharge and vaginal pain.  Neurological:  Negative for syncope.    Physical Exam Updated Vital Signs BP 98/63 (BP Location: Left Arm)   Pulse (!) 112   Temp 97.9 F (36.6 C) (Oral)   Resp 16   Ht 5\' 9"  (1.753 m)   Wt 89.4 kg   LMP 11/30/2022   SpO2 100%   BMI 29.09 kg/m  Physical Exam Vitals and nursing note reviewed.  Constitutional:      General: She is not in acute distress.    Appearance: She is not toxic-appearing.  HENT:     Mouth/Throat:     Comments: Dry Eyes:     General: No scleral icterus. Cardiovascular:     Rate and Rhythm: Tachycardia present.  Pulmonary:     Effort: Pulmonary effort is normal. No respiratory distress.     Breath sounds: Normal breath sounds.     Comments: Lung sounds are clear to auscultation bilaterally.  She speaking in full sentences with ease and is satting well on room air without any increased work of breathing. Chest:     Chest wall: Tenderness present.     Comments:  Diffuse anterior chest wall tenderness to palpation. No acute overlying skin changes noted.  No increase in erythema, warmth, induration, or fluctuance. Abdominal:     General: Bowel sounds are normal. There is no distension.     Palpations: Abdomen is soft.     Tenderness: There is generalized abdominal tenderness. There is no right CVA tenderness, left CVA tenderness, guarding or rebound.  Musculoskeletal:     Right lower leg: No edema.     Left lower leg: No edema.  Skin:    General: Skin is warm and dry.  Neurological:     Mental Status: She is alert.     ED Results / Procedures / Treatments   Labs (all labs ordered are listed, but only abnormal results are displayed) Labs Reviewed  COMPREHENSIVE METABOLIC PANEL - Abnormal; Notable for the following components:  Result Value   Sodium 134 (*)    CO2 16 (*)    Glucose, Bld 267 (*)    Creatinine, Ser 1.02 (*)    Calcium 8.8 (*)    AST 13 (*)    Total Bilirubin 2.1 (*)    Anion gap 17 (*)    All other components within normal limits  CBC - Abnormal; Notable for the following components:   WBC 10.9 (*)    HCT 34.8 (*)    MCV 75.0 (*)    MCH 25.9 (*)    RDW 21.5 (*)    All other components within normal limits  URINALYSIS, ROUTINE W REFLEX MICROSCOPIC - Abnormal; Notable for the following components:   APPearance HAZY (*)    Glucose, UA >=500 (*)    Hgb urine dipstick MODERATE (*)    Ketones, ur 20 (*)    Leukocytes,Ua TRACE (*)    Bacteria, UA RARE (*)    All other components within normal limits  CBG MONITORING, ED - Abnormal; Notable for the following components:   Glucose-Capillary 280 (*)    All other components within normal limits  RESP PANEL BY RT-PCR (RSV, FLU A&B, COVID)  RVPGX2  LIPASE, BLOOD  HCG, SERUM, QUALITATIVE  BETA-HYDROXYBUTYRIC ACID  I-STAT VENOUS BLOOD GAS, ED    EKG None  Radiology DG Chest Portable 1 View  Result Date: 12/24/2022 CLINICAL DATA:  Cough.  Lower abdominal pain. EXAM:  PORTABLE CHEST 1 VIEW COMPARISON:  05/13/2022. FINDINGS: Low lung volume. Bilateral lung fields are clear. No dense consolidation or lung collapse. Bilateral costophrenic angles are clear. Normal cardio-mediastinal silhouette. No acute osseous abnormalities. The soft tissues are within normal limits. IMPRESSION: No active disease. Electronically Signed   By: Jules Schick M.D.   On: 12/24/2022 14:19   CT ABDOMEN PELVIS W CONTRAST  Result Date: 12/24/2022 CLINICAL DATA:  Acute abdominal pain EXAM: CT ABDOMEN AND PELVIS WITH CONTRAST TECHNIQUE: Multidetector CT imaging of the abdomen and pelvis was performed using the standard protocol following bolus administration of intravenous contrast. RADIATION DOSE REDUCTION: This exam was performed according to the departmental dose-optimization program which includes automated exposure control, adjustment of the mA and/or kV according to patient size and/or use of iterative reconstruction technique. CONTRAST:  75mL OMNIPAQUE IOHEXOL 350 MG/ML SOLN COMPARISON:  05/13/2022 FINDINGS: Lower chest: Small pleural effusion seen previously have resolved. No pericardial effusion. Visualized lung bases clear. Hepatobiliary: No focal liver abnormality is seen. Status post cholecystectomy. No biliary dilatation. Pancreas: Unremarkable. No pancreatic ductal dilatation or surrounding inflammatory changes. Spleen: Normal in size without focal abnormality. Adrenals/Urinary Tract: Adrenal glands are unremarkable. Kidneys are normal, without renal calculi, focal lesion, or hydronephrosis. Bladder is unremarkable. Stomach/Bowel: Stomach and small bowel nondistended, unremarkable. Normal appendix. The colon is partially distend, without acute finding. Vascular/Lymphatic: No significant vascular findings are present. No enlarged abdominal or pelvic lymph nodes. Reproductive: 2.8 cm right fundal fibroid. 3.1 cm left ovarian cyst, previously 4 cm. Other: No ascites.  No free air.  Musculoskeletal: Degenerative disc disease L4-5, post posterior decompression. IMPRESSION: 1. No acute findings. 2. 3.1 cm left ovarian cyst, previously 4 cm. No follow-up imaging recommended. Note: This recommendation does not apply to premenarchal patients and to those with increased risk (genetic, family history, elevated tumor markers or other high-risk factors) of ovarian cancer. Reference: JACR 2020 Feb; 17(2):248-254 3. 2.8 cm right fundal fibroid. Electronically Signed   By: Corlis Leak M.D.   On: 12/24/2022 12:28    Procedures .  Critical Care  Performed by: Achille Rich, PA-C Authorized by: Achille Rich, PA-C   Critical care provider statement:    Critical care time (minutes):  30   Critical care was necessary to treat or prevent imminent or life-threatening deterioration of the following conditions:  Endocrine crisis (DKA)   Critical care was time spent personally by me on the following activities:  Development of treatment plan with patient or surrogate, discussions with consultants, evaluation of patient's response to treatment, examination of patient, ordering and review of laboratory studies, ordering and review of radiographic studies, ordering and performing treatments and interventions, pulse oximetry, re-evaluation of patient's condition and review of old charts   Care discussed with: admitting provider     Medications Ordered in ED Medications  ondansetron (ZOFRAN-ODT) disintegrating tablet 4 mg (4 mg Oral Given 12/24/22 0607)  lactated ringers bolus 1,000 mL (1,000 mLs Intravenous New Bag/Given 12/24/22 0906)    ED Course/ Medical Decision Making/ A&P                               Medical Decision Making Amount and/or Complexity of Data Reviewed Labs: ordered. Radiology: ordered.  Risk Prescription drug management. Decision regarding hospitalization.   42 y.o. female presents to the ER for evaluation of cough/cold symptoms, abdominal pain, nausea, vomiting, chest  pain. Differential diagnosis includes but is not limited to AAA, mesenteric ischemia, appendicitis, diverticulitis, DKA, gastroenteritis, nephrolithiasis, pancreatitis, constipation, UTI, bowel obstruction, biliary disease, IBD, PUD, hepatitis, ectopic pregnancy, ovarian torsion, PID, ACS, pericarditis, myocarditis, aortic dissection, PE, pneumothorax, esophageal spasm or rupture, chronic angina, pneumonia, bronchitis, GERD, reflux/PUD, biliary disease, pancreatitis, costochondritis, anxiety. Vital signs show tachycardia, otherwise unremarkable.  It appears patient does have a history of tachycardia or borderline tachycardia with her previous appointments of visits.  Physical exam as noted above.   I independently reviewed and interpreted the patient's labs.  Respiratory panel is negative for COVID, flu, RSV.  I-STAT VBG shows pH of 7.3.  Urinalysis shows glucose present as well as ketones.  There is rare bacteria with 6-10 white blood cells and trace leukocytes present however she is not having any dysuria or hematuria.  She reports its more diffuse abdominal pain with nausea and vomiting.  Budding yeast also present.  Urine culture pending.  Troponin at 4.  Beta hydroxybutyrate acid elevated at 3.53.  CMP shows sodium 134 with a glucose of 267.  Bicarb at 16 with a gap of 17.  Creatinine at 1.02 is around patient's baseline from a few days prior.  Total bili mildly elevated 2.1 with an AST of 13.  No other electrolyte or LFT abnormalities.  EKG shows sinus tachycardia.   CXR shows No active disease.   CT ABD/PELVIS 1. No acute findings. 2. 3.1 cm left ovarian cyst, previously 4 cm. No follow-up imaging recommended. Note: This recommendation does not apply to premenarchal patients and to those with increased risk (genetic, family history, elevated tumor markers or other high-risk factors) of ovarian cancer.   The patient meets criteria for DKA. Order set placed.   Discussed need for admission with  patient.  She agrees.  For her cough and cold symptoms, likely viral illness.  X-ray does not show any pneumonia.  Her lung sounds are clear to auscultation.  Chest wall pain with reproducible tenderness on palpation.  Troponin normal with a EKG showing sinus tach.  Doubt any ACS.  I have lower suspicion for any PE,  dissection, or aneurysm.  Likely experiencing some other symptoms from her DKA.  Will admit to hospitalist for further evaluation and workup.  Portions of this report may have been transcribed using voice recognition software. Every effort was made to ensure accuracy; however, inadvertent computerized transcription errors may be present.   Final Clinical Impression(s) / ED Diagnoses Final diagnoses:  Diabetic ketoacidosis without coma associated with type 2 diabetes mellitus (HCC)  Viral URI with cough    Rx / DC Orders ED Discharge Orders     None         Achille Rich, PA-C 12/24/22 1721    Gwyneth Sprout, MD 12/27/22 [phone]

## 2022-12-24 NOTE — Inpatient Diabetes Management (Signed)
Inpatient Diabetes Program Recommendations  AACE/ADA: New Consensus Statement on Inpatient Glycemic Control   Target Ranges:  Prepandial:   less than 140 mg/dL      Peak postprandial:   less than 180 mg/dL (1-2 hours)      Critically ill patients:  140 - 180 mg/dL    Latest Reference Range & Units 12/24/22 09:00 12/24/22 11:18  Glucose-Capillary 70 - 99 mg/dL 454 (H) 098 (H)    Latest Reference Range & Units 12/24/22 06:13  CO2 22 - 32 mmol/L 16 (L)  Glucose 70 - 99 mg/dL 119 (H)  Anion gap 5 - 15  17 (H)    Latest Reference Range & Units 12/24/22 09:06  Beta-Hydroxybutyric Acid 0.05 - 0.27 mmol/L 3.53 (H)   Review of Glycemic Control  Diabetes history: DM2 Outpatient Diabetes medications: Jardiance 25 mg QAM, Glipizide 20 mg QAM, Glipizide 10 mg QPM, Ozempic 0.5 mg Qweek Current orders for Inpatient glycemic control: IV insulin  Inpatient Diabetes Program Recommendations:    Insulin: IV insulin should be continued until acidosis has resolved completely.  Outpatient DM: Since patient has DKA, would recommend patient stop Jardiance outpatient. Question if abdominal pain, N/V/D related to starting Ozempic. If so, may need to consider stopping Ozempic outpatient as well; may need to consider using insulin outpatient.  NOTE: Patient in ED with reports of lower abdominal pain x3 days and had N/V/D since yesterday. IV insulin just ordered at 11:01 per DKA. In reviewing the chart, patient sees Dr. Lonzo Cloud (Endocrinologist) and was last seen 10/16/22. Per office note on 10/16/22 patient was told to increase Jardiance 25 mg daily, Glipizide 20 units QAM, Glipizide 10 mg QPM, start Ozempic 0.25 mg Qweek x 6 weeks and then increase to 0.5 mg.  Thanks, Orlando Penner, RN, MSN, CDCES Diabetes Coordinator Inpatient Diabetes Program [phone] (Team Pager from 8am to 5pm)

## 2022-12-24 NOTE — ED Triage Notes (Signed)
Pt arrived POV d/t Lower ABD pain for 3 days and has had N/V/D since yesterday.  Pt not able to hold anything donw.  Pt is Hypotensive in triage 90/60 x2.

## 2022-12-24 NOTE — H&P (Signed)
[facility] History and Physical   Patient: Melinda Stone EAV:[id number]   PCP: Anselm Jungling, NP DOB: [birth year]   DOA: 12/24/2022   DOS: 12/24/2022   DOS: the patient was seen and examined on 12/24/2022  Patient coming from: The patient is coming from Home  Chief Complaint: Nausea vomiting diarrhea and abdominal pain for 2 days and URI for 2 days  HPI: Melinda Stone is a 42 y.o. female with Past medical history of NIDDM T2, peripheral neuropathy, chronic backache history of postlaminectomy, migraine headache, as reviewed from EMR, presented at [facility], ED with complaining of nausea vomiting and diarrhea, abdominal pain for past 3 days.  Upper respiratory symptoms with runny nose and chest congestion for past 2 to 3 days.  On Saturday patient was seen at [facility] long with similar complaints, blood sugar was high, it was corrected but she was not in DKA so she was at home but her symptoms got worse yesterday, complaining of lower abdominal pain, nausea vomiting and diarrhea, unable to keep anything down.   ED Course: VS tachycardia HR 112, respiratory rate 16-24, BP 98/63, saturating 100% on room air Negative COVID, flu and RSV Blood glucose 551 elevated, sodium 129 pseudohyponatremia, calcium 8.6, Repeated BMP shows improvement of blood glucose 267, CO2 16 low, anion gap 17 elevated Leukocytosis WBC 10.9 slightly elevated most likely reactive Beta-hydroxybutyrate 3.5 elevated VBG pCO2 72, acid base deficit 9, HCO3 16 CT a/p: 1. No acute findings. 2. 3.1 cm left ovarian cyst, previously 4 cm. No follow-up imaging recommended. Note: This recommendation does not apply to premenarchal patients and to those with increased risk (genetic, family history, elevated tumor markers or other high-risk factors) of ovarian cancer. Reference: JACR 2020 Feb; 17(2):248-254 3. 2.8 cm right fundal fibroid.   Review of Systems: as mentioned in the history of present illness.  All  other systems reviewed and are negative.  Past Medical History:  Diagnosis Date   Anemia    Arthritis 08/17/2020   Chronic back pain    Diabetes mellitus without complication (HCC)    type 2   GERD (gastroesophageal reflux disease)    Migraine    Ovarian cyst    Pneumonia    Polyneuropathy    Past Surgical History:  Procedure Laterality Date   CESAREAN SECTION     CHOLECYSTECTOMY  2004   LUMBAR LAMINECTOMY N/A 05/30/2020   Procedure: Lumbar five Laminectomy,  Bilateral Microdiscectomy, Left Lumbar five -Sacral one Microdiscectomy;  Surgeon: [name], MD;  Location: [facility] OR;  Service: Orthopedics;  Laterality: N/A;   LUMBAR LAMINECTOMY Left 11/14/2020   Procedure: LEFT LUMBAR FOUR-FIVE MICRODISCECTOMY;  Surgeon: [name], MD;  Location: [facility] OR;  Service: Orthopedics;  Laterality: Left;   TUBAL LIGATION  10/19/2010   Social History:  reports that she has been smoking cigars. She has never used smokeless tobacco. She reports that she does not currently use alcohol. She reports that she does not currently use drugs.  Allergies  Allergen Reactions   Trulicity [Dulaglutide] Diarrhea    Family history reviewed and not pertinent Family History  Problem Relation Age of Onset   Diabetes Mother    Hypertension Mother    Stroke Mother      Prior to Admission medications   Medication Sig Start Date End Date Taking? Authorizing Provider  amitriptyline (ELAVIL) 10 MG tablet Take 10 mg by mouth. 12/04/22   [provider]  cyanocobalamin (VITAMIN B12) 1000 MCG tablet Take 1 tablet (  1,000 mcg total) by mouth daily. 04/10/22   Rickard Patience, MD  empagliflozin (JARDIANCE) 25 MG TABS tablet Take 1 tablet (25 mg total) by mouth daily before breakfast. 10/16/22   Shamleffer, Konrad Dolores, MD  gabapentin (NEURONTIN) 800 MG tablet Take 800 mg by mouth in the morning, at noon, in the evening, and at bedtime. 04/04/21   [provider]  Galcanezumab-gnlm (EMGALITY) 120 MG/ML  SOAJ Inject 120 mg into the skin every 30 (thirty) days. 07/11/22   Glean Salvo, NP  glipiZIDE (GLUCOTROL) 10 MG tablet Take 2 tablets (20 mg total) by mouth daily before breakfast AND 1 tablet (10 mg total) daily before supper. 10/16/22   Shamleffer, Konrad Dolores, MD  guaiFENesin-dextromethorphan (ROBITUSSIN DM) 100-10 MG/5ML syrup Take 5 mLs by mouth every 4 (four) hours as needed for cough. Patient not taking: Reported on 12/22/2022 05/07/22   Rhetta Mura, MD  ondansetron (ZOFRAN-ODT) 4 MG disintegrating tablet Take 1 tablet (4 mg total) by mouth every 8 (eight) hours as needed for nausea or vomiting. 12/22/22   Dione Booze, MD  oxyCODONE-acetaminophen (PERCOCET/ROXICET) 5-325 MG tablet Take 1 tablet by mouth 4 (four) times daily as needed for moderate pain or severe pain. 03/13/21   [provider]  pantoprazole (PROTONIX) 40 MG tablet Take 1 tablet (40 mg total) by mouth daily. 06/12/21   Alwyn Ren, MD  prednisoLONE acetate (PRED FORTE) 1 % ophthalmic suspension Place 1 drop into the left eye 4 (four) times daily. Patient not taking: Reported on 12/22/2022 04/26/22   [provider]  pregabalin (LYRICA) 150 MG capsule Take 150 mg by mouth 3 (three) times daily. Patient not taking: Reported on 12/22/2022 10/30/22   [provider]  Semaglutide,0.25 or 0.5MG /DOS, 2 MG/3ML SOPN Inject 0.5 mg into the skin once a week. 10/16/22   Shamleffer, Konrad Dolores, MD  SUMAtriptan (IMITREX) 50 MG tablet Take 1 tablet (50 mg total) by mouth every 2 (two) hours as needed for migraine. May repeat in 2 hours if headache persists or recurs. 07/11/22   Glean Salvo, NP  tiZANidine (ZANAFLEX) 4 MG tablet Take 4 mg by mouth as needed for muscle spasms. 04/26/22   [provider]  vitamin C (ASCORBIC ACID) 250 MG tablet Take 2 tablets (500 mg total) by mouth daily. Patient taking differently: Take 250 mg by mouth daily. 04/10/22   Rickard Patience, MD    Physical  Exam: Vitals:   12/24/22 1300 12/24/22 1400 12/24/22 1430 12/24/22 1447  BP: 126/75 91/80 117/72   Pulse: (!) 103 100 99   Resp: 13 (!) 24 20   Temp:    99.6 F (37.6 C)  TempSrc:    Oral  SpO2: 100% 100% 100%   Weight:      Height:        General: alert and oriented to time, place, and person. Appear in mild distress, affect appropriate Eyes: PERRLA, Conjunctiva normal ENT: Oral Mucosa Clear, moist  Neck: no JVD, no Abnormal Mass Or lumps Cardiovascular: S1 and S2 Present, no Murmur, peripheral pulses symmetrical Respiratory: good respiratory effort, Bilateral Air entry equal and Decreased, no signs of accessory muscle use, Clear to Auscultation, no Crackles, no wheezes Abdomen: Bowel Sound present, Soft and no tenderness, no hernia Skin: no rashes  Extremities: no Pedal edema, no calf tenderness Neurologic: without any new focal findings Gait not checked due to patient safety concerns  Data Reviewed: I have personally reviewed and interpreted labs, imaging as discussed below.  CBC: Recent Labs  Lab 12/21/22 2255 12/24/22 0613 12/24/22 0921  WBC 11.2* 10.9*  --   HGB 12.1 12.0 11.2*  HCT 34.8* 34.8* 33.0*  MCV 74.0* 75.0*  --   PLT 190 186  --    Basic Metabolic Panel: Recent Labs  Lab 12/21/22 2255 12/24/22 0613 12/24/22 0921 12/24/22 1132  NA 129* 134* 135 134*  K 4.4 4.2 4.4 4.2  CL 98 101  --  104  CO2 22 16*  --  18*  GLUCOSE 551* 267*  --  202*  BUN 18 16  --  16  CREATININE 0.97 1.02*  --  0.89  CALCIUM 8.6* 8.8*  --  8.3*   GFR: Estimated Creatinine Clearance: 100.1 mL/min (by C-G formula based on SCr of 0.89 mg/dL). Liver Function Tests: Recent Labs  Lab 12/24/22 0613  AST 13*  ALT 11  ALKPHOS 93  BILITOT 2.1*  PROT 7.0  ALBUMIN 3.8   Recent Labs  Lab 12/24/22 0613  LIPASE 44   No results for input(s): "AMMONIA" in the last 168 hours. Coagulation Profile: No results for input(s): "INR", "PROTIME" in the last 168 hours. Cardiac  Enzymes: No results for input(s): "CKTOTAL", "CKMB", "CKMBINDEX", "TROPONINI" in the last 168 hours. BNP (last 3 results) No results for input(s): "PROBNP" in the last 8760 hours. HbA1C: No results for input(s): "HGBA1C" in the last 72 hours. CBG: Recent Labs  Lab 12/24/22 0900 12/24/22 1118 12/24/22 1242 12/24/22 1348 12/24/22 1446  GLUCAP 280* 189* 142* 113* 107*   Lipid Profile: No results for input(s): "CHOL", "HDL", "LDLCALC", "TRIG", "CHOLHDL", "LDLDIRECT" in the last 72 hours. Thyroid Function Tests: No results for input(s): "TSH", "T4TOTAL", "FREET4", "T3FREE", "THYROIDAB" in the last 72 hours. Anemia Panel: No results for input(s): "VITAMINB12", "FOLATE", "FERRITIN", "TIBC", "IRON", "RETICCTPCT" in the last 72 hours. Urine analysis:    Component Value Date/Time   COLORURINE YELLOW 12/24/2022 0621   APPEARANCEUR HAZY (A) 12/24/2022 0621   LABSPEC 1.023 12/24/2022 0621   PHURINE 5.0 12/24/2022 0621   GLUCOSEU >=500 (A) 12/24/2022 0621   HGBUR MODERATE (A) 12/24/2022 0621   BILIRUBINUR NEGATIVE 12/24/2022 0621   BILIRUBINUR negative 03/01/2020 1036   KETONESUR 20 (A) 12/24/2022 0621   PROTEINUR NEGATIVE 12/24/2022 0621   UROBILINOGEN 0.2 03/01/2020 1036   NITRITE NEGATIVE 12/24/2022 0621   LEUKOCYTESUR TRACE (A) 12/24/2022 0621    Radiological Exams on Admission: DG Chest Portable 1 View  Result Date: 12/24/2022 CLINICAL DATA:  Cough.  Lower abdominal pain. EXAM: PORTABLE CHEST 1 VIEW COMPARISON:  05/13/2022. FINDINGS: Low lung volume. Bilateral lung fields are clear. No dense consolidation or lung collapse. Bilateral costophrenic angles are clear. Normal cardio-mediastinal silhouette. No acute osseous abnormalities. The soft tissues are within normal limits. IMPRESSION: No active disease. Electronically Signed   By: Jules Schick M.D.   On: 12/24/2022 14:19   CT ABDOMEN PELVIS W CONTRAST  Result Date: 12/24/2022 CLINICAL DATA:  Acute abdominal pain EXAM: CT  ABDOMEN AND PELVIS WITH CONTRAST TECHNIQUE: Multidetector CT imaging of the abdomen and pelvis was performed using the standard protocol following bolus administration of intravenous contrast. RADIATION DOSE REDUCTION: This exam was performed according to the departmental dose-optimization program which includes automated exposure control, adjustment of the mA and/or kV according to patient size and/or use of iterative reconstruction technique. CONTRAST:  75mL OMNIPAQUE IOHEXOL 350 MG/ML SOLN COMPARISON:  05/13/2022 FINDINGS: Lower chest: Small pleural effusion seen previously have resolved. No pericardial effusion. Visualized lung bases clear. Hepatobiliary: No  focal liver abnormality is seen. Status post cholecystectomy. No biliary dilatation. Pancreas: Unremarkable. No pancreatic ductal dilatation or surrounding inflammatory changes. Spleen: Normal in size without focal abnormality. Adrenals/Urinary Tract: Adrenal glands are unremarkable. Kidneys are normal, without renal calculi, focal lesion, or hydronephrosis. Bladder is unremarkable. Stomach/Bowel: Stomach and small bowel nondistended, unremarkable. Normal appendix. The colon is partially distend, without acute finding. Vascular/Lymphatic: No significant vascular findings are present. No enlarged abdominal or pelvic lymph nodes. Reproductive: 2.8 cm right fundal fibroid. 3.1 cm left ovarian cyst, previously 4 cm. Other: No ascites.  No free air. Musculoskeletal: Degenerative disc disease L4-5, post posterior decompression. IMPRESSION: 1. No acute findings. 2. 3.1 cm left ovarian cyst, previously 4 cm. No follow-up imaging recommended. Note: This recommendation does not apply to premenarchal patients and to those with increased risk (genetic, family history, elevated tumor markers or other high-risk factors) of ovarian cancer. Reference: JACR 2020 Feb; 17(2):248-254 3. 2.8 cm right fundal fibroid. Electronically Signed   By: Corlis Leak M.D.   On: 12/24/2022  12:28    I reviewed all nursing notes, pharmacy notes, vitals, pertinent old records.  Assessment/Plan Principal Problem:   DKA (diabetic ketoacidosis) (HCC) Active Problems:   DKA, type 2 (HCC)   DKA (diabetic ketoacidosis) NIDDM T2, prior hemoglobin A1c 7.9 Presented with nausea vomiting diarrhea and abdominal pain, DKA symptoms Labs consistent with DKA Patient was started on Endo tool IV insulin infusion with IV fluid for hydration Follow DKA protocol Transition to basal and sliding scale according to protocol Continue symptomatic treatment for nausea and vomiting Check UDS  URI could be due to allergies Negative COVID, flu and RSV CXR no active disease Started Claritin, Robitussin DM as needed and Benadryl as needed for symptomatic treatment   Peripheral neuropathy, continued gabapentin home dose  Chronic lower back pain, s/p lumbar spine laminectomy Osteoarthritis and migraine Resumed home pain management  Left ovarian cyst and fibroid, follow-up with GYN as an outpatient, no active issues  Nutrition: Carb modified diet DVT Prophylaxis: Subcutaneous Lovenox  Advance goals of care discussion: Full code   Consults: None   Family Communication: family was not present at bedside, at the time of interview.  Opportunity was given to ask question and all questions were answered satisfactorily.  Disposition: Admitted as inpatient, telemetry, progressive unit. Likely to be discharged Home, in 2-3 days.  I have discussed plan of care as described above with RN and patient/family.  Severity of Illness: The appropriate patient status for this patient is INPATIENT. Inpatient status is judged to be reasonable and necessary in order to provide the required intensity of service to ensure the patient's safety. The patient's presenting symptoms, physical exam findings, and initial radiographic and laboratory data in the context of their chronic comorbidities is felt to place them  at high risk for further clinical deterioration. Furthermore, it is not anticipated that the patient will be medically stable for discharge from the hospital within 2 midnights of admission.   * I certify that at the point of admission it is my clinical judgment that the patient will require inpatient hospital care spanning beyond 2 midnights from the point of admission due to high intensity of service, high risk for further deterioration and high frequency of surveillance required.*   Author: Gillis Santa, MD [facility] 12/24/2022 2:49 PM   To reach On-call, see care teams to locate the attending and reach out to them via [email or web address]. If 7PM-7AM, please contact night-coverage If you still have difficulty reaching  the attending provider, please page the [facility] (Director on Call) for [facility] on amion for assistance.

## 2022-12-25 DIAGNOSIS — E1110 Type 2 diabetes mellitus with ketoacidosis without coma: Secondary | ICD-10-CM | POA: Diagnosis not present

## 2022-12-25 LAB — CBC
HCT: 27.8 % — ABNORMAL LOW (ref 36.0–46.0)
Hemoglobin: 9.4 g/dL — ABNORMAL LOW (ref 12.0–15.0)
MCH: 25.1 pg — ABNORMAL LOW (ref 26.0–34.0)
MCHC: 33.8 g/dL (ref 30.0–36.0)
MCV: 74.1 fL — ABNORMAL LOW (ref 80.0–100.0)
Platelets: 165 10*3/uL (ref 150–400)
RBC: 3.75 MIL/uL — ABNORMAL LOW (ref 3.87–5.11)
RDW: 21.7 % — ABNORMAL HIGH (ref 11.5–15.5)
WBC: 5.2 10*3/uL (ref 4.0–10.5)
nRBC: 0 % (ref 0.0–0.2)

## 2022-12-25 LAB — CBG MONITORING, ED
Glucose-Capillary: 149 mg/dL — ABNORMAL HIGH (ref 70–99)
Glucose-Capillary: 150 mg/dL — ABNORMAL HIGH (ref 70–99)
Glucose-Capillary: 151 mg/dL — ABNORMAL HIGH (ref 70–99)
Glucose-Capillary: 152 mg/dL — ABNORMAL HIGH (ref 70–99)
Glucose-Capillary: 152 mg/dL — ABNORMAL HIGH (ref 70–99)
Glucose-Capillary: 163 mg/dL — ABNORMAL HIGH (ref 70–99)
Glucose-Capillary: 164 mg/dL — ABNORMAL HIGH (ref 70–99)
Glucose-Capillary: 166 mg/dL — ABNORMAL HIGH (ref 70–99)
Glucose-Capillary: 168 mg/dL — ABNORMAL HIGH (ref 70–99)

## 2022-12-25 LAB — BASIC METABOLIC PANEL
Anion gap: 6 (ref 5–15)
BUN: 8 mg/dL (ref 6–20)
CO2: 23 mmol/L (ref 22–32)
Calcium: 8.4 mg/dL — ABNORMAL LOW (ref 8.9–10.3)
Chloride: 106 mmol/L (ref 98–111)
Creatinine, Ser: 0.66 mg/dL (ref 0.44–1.00)
GFR, Estimated: >60 mL/min (ref >60)
Glucose, Bld: 157 mg/dL — ABNORMAL HIGH (ref 70–99)
Potassium: 3.3 mmol/L — ABNORMAL LOW (ref 3.5–5.1)
Sodium: 135 mmol/L (ref 135–145)

## 2022-12-25 LAB — PHOSPHORUS: Phosphorus: 3.4 mg/dL (ref 2.5–4.6)

## 2022-12-25 LAB — VITAMIN D 25 HYDROXY (VIT D DEFICIENCY, FRACTURES): Vit D, 25-Hydroxy: 12.86 ng/mL — ABNORMAL LOW (ref 30–100)

## 2022-12-25 LAB — GLUCOSE, CAPILLARY
Glucose-Capillary: 160 mg/dL — ABNORMAL HIGH (ref 70–99)
Glucose-Capillary: 234 mg/dL — ABNORMAL HIGH (ref 70–99)
Glucose-Capillary: 280 mg/dL — ABNORMAL HIGH (ref 70–99)
Glucose-Capillary: 338 mg/dL — ABNORMAL HIGH (ref 70–99)

## 2022-12-25 LAB — MAGNESIUM: Magnesium: 2 mg/dL (ref 1.7–2.4)

## 2022-12-25 MED ORDER — INSULIN ASPART 100 UNIT/ML IJ SOLN
0.0000 [IU] | Freq: Every day | INTRAMUSCULAR | Status: DC
  Administered 2022-12-25: 4 [IU] via SUBCUTANEOUS

## 2022-12-25 MED ORDER — INSULIN ASPART 100 UNIT/ML IJ SOLN
0.0000 [IU] | Freq: Three times a day (TID) | INTRAMUSCULAR | Status: DC
  Administered 2022-12-25: 5 [IU] via SUBCUTANEOUS
  Administered 2022-12-25: 2 [IU] via SUBCUTANEOUS

## 2022-12-25 MED ORDER — INSULIN GLARGINE-YFGN 100 UNIT/ML ~~LOC~~ SOLN
15.0000 [IU] | Freq: Every day | SUBCUTANEOUS | Status: DC
  Administered 2022-12-25: 15 [IU] via SUBCUTANEOUS
  Filled 2022-12-25 (×2): qty 0.15

## 2022-12-25 MED ORDER — VITAMIN D (ERGOCALCIFEROL) 1.25 MG (50000 UNIT) PO CAPS
50000.0000 [IU] | ORAL_CAPSULE | ORAL | Status: DC
  Administered 2022-12-25: 50000 [IU] via ORAL
  Filled 2022-12-25: qty 1

## 2022-12-25 MED ORDER — SALINE SPRAY 0.65 % NA SOLN
1.0000 | NASAL | Status: DC | PRN
  Administered 2022-12-25: 1 via NASAL
  Filled 2022-12-25: qty 44

## 2022-12-25 MED ORDER — SODIUM CHLORIDE 0.9 % IV SOLN: INTRAVENOUS | Status: DC

## 2022-12-25 NOTE — ED Notes (Signed)
Per md dahal, d/c insulin drip at 1000.

## 2022-12-25 NOTE — ED Notes (Signed)
ED TO INPATIENT HANDOFF REPORT  ED Nurse Name and Phone #: Melinda Stone Name/Age/Gender Melinda Stone 42 y.o. female Room/Bed: 032C/032C  Code Status   Code Status: Full Code  Home/SNF/Other Home Patient oriented to: self, place, time, and situation Is this baseline? Yes   Triage Complete: Triage complete  Chief Complaint DKA (diabetic ketoacidosis) (HCC) [E11.10] DKA, type 2 (HCC) [E11.10]  Triage Note Pt arrived POV d/t Lower ABD pain for 3 days and has had N/V/D since yesterday.  Pt not able to hold anything donw.  Pt is Hypotensive in triage 90/60 x2.   Allergies Allergies  Allergen Reactions   Trulicity [Dulaglutide] Diarrhea    Level of Care/Admitting Diagnosis ED Disposition     ED Disposition  Admit   Condition  --   Comment  Hospital Area: [facility] [[id number]]  Level of Care: Progressive [102]  Admit to Progressive based on following criteria: GI, ENDOCRINE disease patients with GI bleeding, acute liver failure or pancreatitis, stable with diabetic ketoacidosis or thyrotoxicosis (hypothyroid) state.  May admit patient to [facility] or [facility] if equivalent level of care is available:: Yes  Covid Evaluation: Asymptomatic - no recent exposure (last 10 days) testing not required  Diagnosis: DKA, type 2 [facility]) [[id number]]  Admitting Physician: Gillis Santa [[id number]]  Attending Physician: Clearance [name]  Certification:: I certify this patient will need inpatient services for at least 2 midnights          B Medical/Surgery History Past Medical History:  Diagnosis Date   Anemia    Arthritis 08/17/2020   Chronic back pain    Diabetes mellitus without complication (HCC)    type 2   GERD (gastroesophageal reflux disease)    Migraine    Ovarian cyst    Pneumonia    Polyneuropathy    Past Surgical History:  Procedure Laterality Date   CESAREAN SECTION     CHOLECYSTECTOMY  2004   LUMBAR LAMINECTOMY N/A  05/30/2020   Procedure: Lumbar five Laminectomy,  Bilateral Microdiscectomy, Left Lumbar five -Sacral one Microdiscectomy;  Surgeon: [name], MD;  Location: [facility] OR;  Service: Orthopedics;  Laterality: N/A;   LUMBAR LAMINECTOMY Left 11/14/2020   Procedure: LEFT LUMBAR FOUR-FIVE MICRODISCECTOMY;  Surgeon: [name], MD;  Location: [facility] OR;  Service: Orthopedics;  Laterality: Left;   TUBAL LIGATION  10/19/2010     A IV Location/Drains/Wounds Patient Lines/Drains/Airways Status     Active Line/Drains/Airways     Name Placement date Placement time Site Days   Peripheral IV 12/24/22 20 G Left Antecubital 12/24/22  0902  Antecubital  1   Peripheral IV 12/24/22 20 G 1" Distal;Posterior;Right Forearm 12/24/22  1130  Forearm  1            Intake/Output Last 24 hours  Intake/Output Summary (Last 24 hours) at 12/25/2022 5621 Last data filed at 12/24/2022 1235 Gross per 24 hour  Intake 95 ml  Output --  Net 95 ml    Labs/Imaging Results for orders placed or performed during the hospital encounter of 12/24/22 (from the past 48 hour(s))  Lipase, blood     Status: None   Collection Time: 12/24/22  6:13 AM  Result Value Ref Range   Lipase 44 11 - 51 U/L    Comment: Performed at [facility], [address], [place], [place] [address]  Comprehensive metabolic panel     Status: Abnormal   Collection Time: 12/24/22  6:13 AM  Result Value Ref Range   Sodium 134 (L) 135 - 145 mmol/L   Potassium 4.2 3.5 - 5.1 mmol/L   Chloride 101 98 - 111 mmol/L   CO2 16 (L) 22 - 32 mmol/L   Glucose, Bld 267 (H) 70 - 99 mg/dL    Comment: Glucose reference range applies only to samples taken after fasting for at least 8 hours.   BUN 16 6 - 20 mg/dL   Creatinine, Ser 1.61 (H) 0.44 - 1.00 mg/dL   Calcium 8.8 (L) 8.9 - 10.3 mg/dL   Total Protein 7.0 6.5 - 8.1 g/dL   Albumin 3.8 3.5 - 5.0 g/dL   AST 13 (L) 15 - 41 U/L   ALT 11 0 - 44 U/L   Alkaline Phosphatase 93 38 - 126 U/L   Total Bilirubin  2.1 (H) 0.3 - 1.2 mg/dL   GFR, Estimated >09 >60 mL/min    Comment: (NOTE) Calculated using the CKD-EPI Creatinine Equation (2021)    Anion gap 17 (H) 5 - 15    Comment: Performed at [facility], [address] [place], [place] [address]  CBC     Status: Abnormal   Collection Time: 12/24/22  6:13 AM  Result Value Ref Range   WBC 10.9 (H) 4.0 - 10.5 K/uL   RBC 4.64 3.87 - 5.11 MIL/uL   Hemoglobin 12.0 12.0 - 15.0 g/dL   HCT 81.1 (L) 91.4 - 78.2 %   MCV 75.0 (L) 80.0 - 100.0 fL   MCH 25.9 (L) 26.0 - 34.0 pg   MCHC 34.5 30.0 - 36.0 g/dL   RDW 95.6 (H) 21.3 - 08.6 %   Platelets 186 150 - 400 K/uL   nRBC 0.0 0.0 - 0.2 %    Comment: Performed at [facility] Lab, [address], [place], [place] [address]  hCG, serum, qualitative     Status: None   Collection Time: 12/24/22  6:13 AM  Result Value Ref Range   Preg, Serum NEGATIVE NEGATIVE    Comment:        THE SENSITIVITY OF THIS METHODOLOGY IS >10 mIU/mL. Performed at [facility], [address], [place], [place] [address]   Urinalysis, Routine w reflex microscopic -Urine, Clean Catch     Status: Abnormal   Collection Time: 12/24/22  6:21 AM  Result Value Ref Range   Color, Urine YELLOW YELLOW   APPearance HAZY (A) CLEAR   Specific Gravity, Urine 1.023 1.005 - 1.030   pH 5.0 5.0 - 8.0   Glucose, UA >=500 (A) NEGATIVE mg/dL   Hgb urine dipstick MODERATE (A) NEGATIVE   Bilirubin Urine NEGATIVE NEGATIVE   Ketones, ur 20 (A) NEGATIVE mg/dL   Protein, ur NEGATIVE NEGATIVE mg/dL   Nitrite NEGATIVE NEGATIVE   Leukocytes,Ua TRACE (A) NEGATIVE   RBC / HPF 6-10 0 - 5 RBC/hpf   WBC, UA 6-10 0 - 5 WBC/hpf   Bacteria, UA RARE (A) NONE SEEN   Squamous Epithelial / HPF 0-5 0 - 5 /HPF   Mucus PRESENT    Budding Yeast PRESENT     Comment: Performed at [facility], [address], [place], [place] [address]  POC CBG, ED     Status: Abnormal   Collection Time: 12/24/22  9:00 AM  Result Value Ref Range    Glucose-Capillary 280 (H) 70 - 99 mg/dL    Comment: Glucose reference range applies only to samples taken after fasting for at least 8  hours.  Beta-hydroxybutyric acid     Status: Abnormal   Collection Time: 12/24/22  9:06 AM  Result Value Ref Range   Beta-Hydroxybutyric Acid 3.53 (H) 0.05 - 0.27 mmol/L    Comment: Performed at [facility], [address] [place], [place] [address]  I-Stat venous blood gas, [facility] ED, [facility], [facility])     Status: Abnormal   Collection Time: 12/24/22  9:21 AM  Result Value Ref Range   pH, Ven 7.308 7.25 - 7.43   pCO2, Ven 32.7 (L) 44 - 60 mmHg   pO2, Ven 41 32 - 45 mmHg   Bicarbonate 16.4 (L) 20.0 - 28.0 mmol/L   TCO2 17 (L) 22 - 32 mmol/L   O2 Saturation 72 %   Acid-base deficit 9.0 (H) 0.0 - 2.0 mmol/L   Sodium 135 135 - 145 mmol/L   Potassium 4.4 3.5 - 5.1 mmol/L   Calcium, Ion 1.14 (L) 1.15 - 1.40 mmol/L   HCT 33.0 (L) 36.0 - 46.0 %   Hemoglobin 11.2 (L) 12.0 - 15.0 g/dL   Sample type VENOUS   CBG monitoring, ED     Status: Abnormal   Collection Time: 12/24/22 11:18 AM  Result Value Ref Range   Glucose-Capillary 189 (H) 70 - 99 mg/dL    Comment: Glucose reference range applies only to samples taken after fasting for at least 8 hours.  Basic metabolic panel     Status: Abnormal   Collection Time: 12/24/22 11:32 AM  Result Value Ref Range   Sodium 134 (L) 135 - 145 mmol/L   Potassium 4.2 3.5 - 5.1 mmol/L   Chloride 104 98 - 111 mmol/L   CO2 18 (L) 22 - 32 mmol/L   Glucose, Bld 202 (H) 70 - 99 mg/dL    Comment: Glucose reference range applies only to samples taken after fasting for at least 8 hours.   BUN 16 6 - 20 mg/dL   Creatinine, Ser 1.91 0.44 - 1.00 mg/dL   Calcium 8.3 (L) 8.9 - 10.3 mg/dL   GFR, Estimated >47 >82 mL/min    Comment: (NOTE) Calculated using the CKD-EPI Creatinine Equation (2021)    Anion gap 12 5 - 15    Comment: Performed at [facility] Lab, [address] [place], [place] [address]  Resp panel by RT-PCR (RSV,  Flu A&B, Covid) Anterior Nasal Swab     Status: None   Collection Time: 12/24/22 11:50 AM   Specimen: Anterior Nasal Swab  Result Value Ref Range   SARS Coronavirus 2 by RT PCR NEGATIVE NEGATIVE   Influenza A by PCR NEGATIVE NEGATIVE   Influenza B by PCR NEGATIVE NEGATIVE    Comment: (NOTE) The Xpert Xpress SARS-CoV-2/FLU/RSV plus assay is intended as an aid in the diagnosis of influenza from Nasopharyngeal swab specimens and should not be used as a sole basis for treatment. Nasal washings and aspirates are unacceptable for Xpert Xpress SARS-CoV-2/FLU/RSV testing.  Fact Sheet for Patients: BloggerCourse.com  Fact Sheet for Healthcare Providers: SeriousBroker.it  This test is not yet approved or cleared by the [place] FDA and has been authorized for detection and/or diagnosis of SARS-CoV-2 by FDA under an Emergency Use Authorization (EUA). This EUA will remain in effect (meaning this test can be used) for the duration of the COVID-19 declaration under Section 564(b)(1) of the Act, 21 U.S.C. section 360bbb-3(b)(1), unless the authorization is terminated or revoked.     Resp Syncytial Virus by PCR NEGATIVE NEGATIVE  Comment: (NOTE) Fact Sheet for Patients: BloggerCourse.com  Fact Sheet for Healthcare Providers: SeriousBroker.it  This test is not yet approved or cleared by the [place] FDA and has been authorized for detection and/or diagnosis of SARS-CoV-2 by FDA under an Emergency Use Authorization (EUA). This EUA will remain in effect (meaning this test can be used) for the duration of the COVID-19 declaration under Section 564(b)(1) of the Act, 21 U.S.C. section 360bbb-3(b)(1), unless the authorization is terminated or revoked.  Performed at [facility] Lab, [address] [place], [place] [address]   CBG monitoring, ED     Status: Abnormal   Collection  Time: 12/24/22 12:42 PM  Result Value Ref Range   Glucose-Capillary 142 (H) 70 - 99 mg/dL    Comment: Glucose reference range applies only to samples taken after fasting for at least 8 hours.  CBG monitoring, ED     Status: Abnormal   Collection Time: 12/24/22  1:48 PM  Result Value Ref Range   Glucose-Capillary 113 (H) 70 - 99 mg/dL    Comment: Glucose reference range applies only to samples taken after fasting for at least 8 hours.  Basic metabolic panel     Status: Abnormal   Collection Time: 12/24/22  2:44 PM  Result Value Ref Range   Sodium 134 (L) 135 - 145 mmol/L   Potassium 3.8 3.5 - 5.1 mmol/L   Chloride 104 98 - 111 mmol/L   CO2 19 (L) 22 - 32 mmol/L   Glucose, Bld 123 (H) 70 - 99 mg/dL    Comment: Glucose reference range applies only to samples taken after fasting for at least 8 hours.   BUN 11 6 - 20 mg/dL   Creatinine, Ser 8.46 0.44 - 1.00 mg/dL   Calcium 8.2 (L) 8.9 - 10.3 mg/dL   GFR, Estimated >96 >29 mL/min    Comment: (NOTE) Calculated using the CKD-EPI Creatinine Equation (2021)    Anion gap 11 5 - 15    Comment: Performed at [facility] Lab, [address], [place], [place] [address]  Magnesium     Status: None   Collection Time: 12/24/22  2:44 PM  Result Value Ref Range   Magnesium 2.0 1.7 - 2.4 mg/dL    Comment: Performed at [facility], [address], [place], [place] [address]  Phosphorus     Status: None   Collection Time: 12/24/22  2:44 PM  Result Value Ref Range   Phosphorus 3.0 2.5 - 4.6 mg/dL    Comment: Performed at [facility], [address], [place], [place] [address]  Troponin I (High Sensitivity)     Status: None   Collection Time: 12/24/22  2:44 PM  Result Value Ref Range   Troponin I (High Sensitivity) 4 <18 ng/L    Comment: (NOTE) Elevated high sensitivity troponin I (hsTnI) values and significant  changes across serial measurements may suggest ACS but many other  chronic and acute conditions are known to elevate  hsTnI results.  Refer to the "Links" section for chest pain algorithms and additional  guidance. Performed at [facility], [address], [place], [place] [address]   CBG monitoring, ED     Status: Abnormal   Collection Time: 12/24/22  2:46 PM  Result Value Ref Range   Glucose-Capillary 107 (H) 70 - 99 mg/dL    Comment: Glucose reference range applies only to samples taken after fasting for at least 8 hours.  Rapid urine  drug screen (hospital performed)     Status: None   Collection Time: 12/24/22  3:22 PM  Result Value Ref Range   Opiates NONE DETECTED NONE DETECTED   Cocaine NONE DETECTED NONE DETECTED   Benzodiazepines NONE DETECTED NONE DETECTED   Amphetamines NONE DETECTED NONE DETECTED   Tetrahydrocannabinol NONE DETECTED NONE DETECTED   Barbiturates NONE DETECTED NONE DETECTED    Comment: (NOTE) DRUG SCREEN FOR MEDICAL PURPOSES ONLY.  IF CONFIRMATION IS NEEDED FOR ANY PURPOSE, NOTIFY LAB WITHIN 5 DAYS.  LOWEST DETECTABLE LIMITS FOR URINE DRUG SCREEN Drug Class                     Cutoff (ng/mL) Amphetamine and metabolites    1000 Barbiturate and metabolites    200 Benzodiazepine                 200 Opiates and metabolites        300 Cocaine and metabolites        300 THC                            50 Performed at [facility] Lab, [address], [place], [place] [address]   CBG monitoring, ED     Status: Abnormal   Collection Time: 12/24/22  3:41 PM  Result Value Ref Range   Glucose-Capillary 142 (H) 70 - 99 mg/dL    Comment: Glucose reference range applies only to samples taken after fasting for at least 8 hours.  CBG monitoring, ED     Status: Abnormal   Collection Time: 12/24/22  4:44 PM  Result Value Ref Range   Glucose-Capillary 151 (H) 70 - 99 mg/dL    Comment: Glucose reference range applies only to samples taken after fasting for at least 8 hours.  Basic metabolic panel     Status: Abnormal   Collection Time: 12/24/22  4:48 PM  Result Value  Ref Range   Sodium 134 (L) 135 - 145 mmol/L   Potassium 3.5 3.5 - 5.1 mmol/L   Chloride 101 98 - 111 mmol/L   CO2 19 (L) 22 - 32 mmol/L   Glucose, Bld 166 (H) 70 - 99 mg/dL    Comment: Glucose reference range applies only to samples taken after fasting for at least 8 hours.   BUN 12 6 - 20 mg/dL   Creatinine, Ser 6.04 0.44 - 1.00 mg/dL   Calcium 8.4 (L) 8.9 - 10.3 mg/dL   GFR, Estimated >54 >09 mL/min    Comment: (NOTE) Calculated using the CKD-EPI Creatinine Equation (2021)    Anion gap 14 5 - 15    Comment: Performed at [facility] Lab, [address], [place], [place] [address]  CBG monitoring, ED     Status: Abnormal   Collection Time: 12/24/22  5:57 PM  Result Value Ref Range   Glucose-Capillary 160 (H) 70 - 99 mg/dL    Comment: Glucose reference range applies only to samples taken after fasting for at least 8 hours.  Basic metabolic panel     Status: Abnormal   Collection Time: 12/24/22  6:15 PM  Result Value Ref Range   Sodium 134 (L) 135 - 145 mmol/L   Potassium 3.5 3.5 - 5.1 mmol/L   Chloride 105 98 - 111 mmol/L   CO2 20 (L) 22 - 32 mmol/L   Glucose, Bld 170 (H) 70 - 99 mg/dL    Comment:  Glucose reference range applies only to samples taken after fasting for at least 8 hours.   BUN 7 6 - 20 mg/dL   Creatinine, Ser 4.09 0.44 - 1.00 mg/dL   Calcium 8.3 (L) 8.9 - 10.3 mg/dL   GFR, Estimated >81 >19 mL/min    Comment: (NOTE) Calculated using the CKD-EPI Creatinine Equation (2021)    Anion gap 9 5 - 15    Comment: Performed at [facility] Lab, [address], [place], [place] [address]  Troponin I (High Sensitivity)     Status: None   Collection Time: 12/24/22  6:15 PM  Result Value Ref Range   Troponin I (High Sensitivity) 3 <18 ng/L    Comment: (NOTE) Elevated high sensitivity troponin I (hsTnI) values and significant  changes across serial measurements may suggest ACS but many other  chronic and acute conditions are known to elevate hsTnI results.  Refer to  the "Links" section for chest pain algorithms and additional  guidance. Performed at [facility], [address], [place], [place] [address]   CBG monitoring, ED     Status: Abnormal   Collection Time: 12/24/22  7:58 PM  Result Value Ref Range   Glucose-Capillary 217 (H) 70 - 99 mg/dL    Comment: Glucose reference range applies only to samples taken after fasting for at least 8 hours.  CBG monitoring, ED     Status: Abnormal   Collection Time: 12/24/22  8:29 PM  Result Value Ref Range   Glucose-Capillary 193 (H) 70 - 99 mg/dL    Comment: Glucose reference range applies only to samples taken after fasting for at least 8 hours.  CBG monitoring, ED     Status: Abnormal   Collection Time: 12/24/22  9:51 PM  Result Value Ref Range   Glucose-Capillary 155 (H) 70 - 99 mg/dL    Comment: Glucose reference range applies only to samples taken after fasting for at least 8 hours.  CBG monitoring, ED     Status: Abnormal   Collection Time: 12/24/22 10:50 PM  Result Value Ref Range   Glucose-Capillary 157 (H) 70 - 99 mg/dL    Comment: Glucose reference range applies only to samples taken after fasting for at least 8 hours.  CBG monitoring, ED     Status: Abnormal   Collection Time: 12/25/22 12:32 AM  Result Value Ref Range   Glucose-Capillary 166 (H) 70 - 99 mg/dL    Comment: Glucose reference range applies only to samples taken after fasting for at least 8 hours.  CBG monitoring, ED     Status: Abnormal   Collection Time: 12/25/22  1:35 AM  Result Value Ref Range   Glucose-Capillary 152 (H) 70 - 99 mg/dL    Comment: Glucose reference range applies only to samples taken after fasting for at least 8 hours.  CBG monitoring, ED     Status: Abnormal   Collection Time: 12/25/22  2:56 AM  Result Value Ref Range   Glucose-Capillary 164 (H) 70 - 99 mg/dL    Comment: Glucose reference range applies only to samples taken after fasting for at least 8 hours.  CBG monitoring, ED     Status:  Abnormal   Collection Time: 12/25/22  4:01 AM  Result Value Ref Range   Glucose-Capillary 163 (H) 70 - 99 mg/dL    Comment: Glucose reference range applies only to samples taken after fasting for at least 8 hours.  CBG monitoring, ED     Status:  Abnormal   Collection Time: 12/25/22  5:11 AM  Result Value Ref Range   Glucose-Capillary 150 (H) 70 - 99 mg/dL    Comment: Glucose reference range applies only to samples taken after fasting for at least 8 hours.  Basic metabolic panel     Status: Abnormal   Collection Time: 12/25/22  5:22 AM  Result Value Ref Range   Sodium 135 135 - 145 mmol/L   Potassium 3.3 (L) 3.5 - 5.1 mmol/L   Chloride 106 98 - 111 mmol/L   CO2 23 22 - 32 mmol/L   Glucose, Bld 157 (H) 70 - 99 mg/dL    Comment: Glucose reference range applies only to samples taken after fasting for at least 8 hours.   BUN 8 6 - 20 mg/dL   Creatinine, Ser 2.84 0.44 - 1.00 mg/dL   Calcium 8.4 (L) 8.9 - 10.3 mg/dL   GFR, Estimated >13 >24 mL/min    Comment: (NOTE) Calculated using the CKD-EPI Creatinine Equation (2021)    Anion gap 6 5 - 15    Comment: Performed at [facility] Lab, [address] [place], [place] [address]  CBC     Status: Abnormal   Collection Time: 12/25/22  5:22 AM  Result Value Ref Range   WBC 5.2 4.0 - 10.5 K/uL   RBC 3.75 (L) 3.87 - 5.11 MIL/uL   Hemoglobin 9.4 (L) 12.0 - 15.0 g/dL   HCT 72.5 (L) 36.6 - 44.0 %   MCV 74.1 (L) 80.0 - 100.0 fL   MCH 25.1 (L) 26.0 - 34.0 pg   MCHC 33.8 30.0 - 36.0 g/dL   RDW 34.7 (H) 42.5 - 95.6 %   Platelets 165 150 - 400 K/uL   nRBC 0.0 0.0 - 0.2 %    Comment: Performed at [facility] Lab, [address] [place], [place] [address]  Magnesium     Status: None   Collection Time: 12/25/22  5:22 AM  Result Value Ref Range   Magnesium 2.0 1.7 - 2.4 mg/dL    Comment: Performed at [facility] Lab, [address] [place], [place] [address]  Phosphorus     Status: None   Collection Time: 12/25/22  5:22 AM  Result  Value Ref Range   Phosphorus 3.4 2.5 - 4.6 mg/dL    Comment: Performed at [facility] Lab, [address], [place], [place] [address]  VITAMIN D 25 Hydroxy (Vit-D Deficiency, Fractures)     Status: Abnormal   Collection Time: 12/25/22  5:22 AM  Result Value Ref Range   Vit D, 25-Hydroxy 12.86 (L) 30 - 100 ng/mL    Comment: (NOTE) Vitamin D deficiency has been defined by the Institute of Medicine  and an Endocrine Society practice guideline as a level of serum 25-OH  vitamin D less than 20 ng/mL (1,2). The Endocrine Society went on to  further define vitamin D insufficiency as a level between 21 and 29  ng/mL (2).  1. IOM (Institute of Medicine). 2010. Dietary reference intakes for  calcium and D. [place]: [employer]. 2. Holick MF, [employer], Bischoff-Ferrari HA, et al. Evaluation,  treatment, and prevention of vitamin D deficiency: an Endocrine  Society clinical practice guideline, JCEM. 2011 Jul; 96(7): 1911-30.  Performed at [facility] Lab, [address], [place], [place] [address]   CBG monitoring, ED     Status: Abnormal   Collection Time: 12/25/22  6:14 AM  Result Value Ref  Range   Glucose-Capillary 149 (H) 70 - 99 mg/dL    Comment: Glucose reference range applies only to samples taken after fasting for at least 8 hours.  CBG monitoring, ED     Status: Abnormal   Collection Time: 12/25/22  7:17 AM  Result Value Ref Range   Glucose-Capillary 152 (H) 70 - 99 mg/dL    Comment: Glucose reference range applies only to samples taken after fasting for at least 8 hours.  CBG monitoring, ED     Status: Abnormal   Collection Time: 12/25/22  8:23 AM  Result Value Ref Range   Glucose-Capillary 151 (H) 70 - 99 mg/dL    Comment: Glucose reference range applies only to samples taken after fasting for at least 8 hours.  CBG monitoring, ED     Status: Abnormal   Collection Time: 12/25/22  9:42 AM  Result Value Ref Range   Glucose-Capillary 168 (H) 70 - 99  mg/dL    Comment: Glucose reference range applies only to samples taken after fasting for at least 8 hours.   DG Chest Portable 1 View  Result Date: 12/24/2022 CLINICAL DATA:  Cough.  Lower abdominal pain. EXAM: PORTABLE CHEST 1 VIEW COMPARISON:  05/13/2022. FINDINGS: Low lung volume. Bilateral lung fields are clear. No dense consolidation or lung collapse. Bilateral costophrenic angles are clear. Normal cardio-mediastinal silhouette. No acute osseous abnormalities. The soft tissues are within normal limits. IMPRESSION: No active disease. Electronically Signed   By: Jules Schick M.D.   On: 12/24/2022 14:19   CT ABDOMEN PELVIS W CONTRAST  Result Date: 12/24/2022 CLINICAL DATA:  Acute abdominal pain EXAM: CT ABDOMEN AND PELVIS WITH CONTRAST TECHNIQUE: Multidetector CT imaging of the abdomen and pelvis was performed using the standard protocol following bolus administration of intravenous contrast. RADIATION DOSE REDUCTION: This exam was performed according to the departmental dose-optimization program which includes automated exposure control, adjustment of the mA and/or kV according to patient size and/or use of iterative reconstruction technique. CONTRAST:  75mL OMNIPAQUE IOHEXOL 350 MG/ML SOLN COMPARISON:  05/13/2022 FINDINGS: Lower chest: Small pleural effusion seen previously have resolved. No pericardial effusion. Visualized lung bases clear. Hepatobiliary: No focal liver abnormality is seen. Status post cholecystectomy. No biliary dilatation. Pancreas: Unremarkable. No pancreatic ductal dilatation or surrounding inflammatory changes. Spleen: Normal in size without focal abnormality. Adrenals/Urinary Tract: Adrenal glands are unremarkable. Kidneys are normal, without renal calculi, focal lesion, or hydronephrosis. Bladder is unremarkable. Stomach/Bowel: Stomach and small bowel nondistended, unremarkable. Normal appendix. The colon is partially distend, without acute finding. Vascular/Lymphatic: No  significant vascular findings are present. No enlarged abdominal or pelvic lymph nodes. Reproductive: 2.8 cm right fundal fibroid. 3.1 cm left ovarian cyst, previously 4 cm. Other: No ascites.  No free air. Musculoskeletal: Degenerative disc disease L4-5, post posterior decompression. IMPRESSION: 1. No acute findings. 2. 3.1 cm left ovarian cyst, previously 4 cm. No follow-up imaging recommended. Note: This recommendation does not apply to premenarchal patients and to those with increased risk (genetic, family history, elevated tumor markers or other high-risk factors) of ovarian cancer. Reference: JACR 2020 Feb; 17(2):248-254 3. 2.8 cm right fundal fibroid. Electronically Signed   By: Corlis Leak M.D.   On: 12/24/2022 12:28    Pending Labs Unresulted Labs (From admission, onward)     Start     Ordered   12/25/22 0500  Basic metabolic panel  Daily,   R      12/24/22 1319   12/25/22 0500  CBC  Daily,  R      12/24/22 1319   12/25/22 0500  Magnesium  Daily,   R      12/24/22 1319   12/25/22 0500  Phosphorus  Daily,   R      12/24/22 1319   12/24/22 1103  Urine Culture  Add-on,   AD       Question:  Indication  Answer:  Suprapubic pain   12/24/22 1102            Vitals/Pain Today's Vitals   12/25/22 0725 12/25/22 0758 12/25/22 0811 12/25/22 0944  BP:  123/64    Pulse:  90    Resp:  12    Temp:  98.4 F (36.9 C)    TempSrc:      SpO2: 99% 98%    Weight:      Height:      PainSc:   6  9     Isolation Precautions Airborne and Contact precautions  Medications Medications  insulin regular, human (MYXREDLIN) 100 units/ 100 mL infusion (2.2 Units/hr Intravenous Rate/Dose Change 12/25/22 0943)  lactated ringers infusion ( Intravenous Not Given 12/24/22 1136)  dextrose 50 % solution 0-50 mL (has no administration in time range)  enoxaparin (LOVENOX) injection 40 mg (40 mg Subcutaneous Given 12/24/22 1420)  sodium chloride flush (NS) 0.9 % injection 3 mL (3 mLs Intravenous Not Given  12/24/22 2116)  sodium chloride flush (NS) 0.9 % injection 3 mL (has no administration in time range)  0.9 %  sodium chloride infusion (has no administration in time range)  acetaminophen (TYLENOL) tablet 650 mg (has no administration in time range)    Or  acetaminophen (TYLENOL) suppository 650 mg (has no administration in time range)  ondansetron (ZOFRAN) tablet 4 mg ( Oral See Alternative 12/24/22 1640)    Or  ondansetron (ZOFRAN) injection 4 mg (4 mg Intravenous Given 12/24/22 1640)  albuterol (PROVENTIL) (2.5 MG/3ML) 0.083% nebulizer solution 2.5 mg (has no administration in time range)  amitriptyline (ELAVIL) tablet 10 mg (10 mg Oral Given 12/24/22 2130)  gabapentin (NEURONTIN) capsule 800 mg (800 mg Oral Given 12/25/22 0926)  guaiFENesin-dextromethorphan (ROBITUSSIN DM) 100-10 MG/5ML syrup 5 mL (5 mLs Oral Given 12/25/22 0837)  oxyCODONE-acetaminophen (PERCOCET/ROXICET) 5-325 MG per tablet 1 tablet (1 tablet Oral Given 12/24/22 2030)  pantoprazole (PROTONIX) EC tablet 40 mg (40 mg Oral Given 12/25/22 0926)  tiZANidine (ZANAFLEX) tablet 4 mg (has no administration in time range)  SUMAtriptan (IMITREX) tablet 50 mg (has no administration in time range)  loratadine (CLARITIN) tablet 10 mg (10 mg Oral Given 12/25/22 0926)  diphenhydrAMINE (BENADRYL) capsule 25 mg (25 mg Oral Given 12/25/22 0831)  alum & mag hydroxide-simeth (MAALOX/MYLANTA) 200-200-20 MG/5ML suspension 15 mL (has no administration in time range)  calcium carbonate (TUMS - dosed in mg elemental calcium) chewable tablet 200 mg of elemental calcium (200 mg of elemental calcium Oral Given 12/24/22 2039)  Vitamin D (Ergocalciferol) (DRISDOL) 1.25 MG (50000 UNIT) capsule 50,000 Units (has no administration in time range)  0.9 %  sodium chloride infusion ( Intravenous New Bag/Given 12/25/22 0923)  insulin aspart (novoLOG) injection 0-9 Units (has no administration in time range)  insulin aspart (novoLOG) injection 0-5 Units (has no  administration in time range)  insulin glargine-yfgn (SEMGLEE) injection 15 Units (has no administration in time range)  ondansetron (ZOFRAN-ODT) disintegrating tablet 4 mg (4 mg Oral Given 12/24/22 0607)  lactated ringers bolus 1,000 mL (0 mLs Intravenous Stopped 12/24/22 1048)  iohexol (OMNIPAQUE) 350 MG/ML injection  75 mL (75 mLs Intravenous Contrast Given 12/24/22 1033)  lactated ringers bolus 1,000 mL (0 mLs Intravenous Stopped 12/24/22 1344)  lactated ringers bolus 1,788 mL (0 mLs Intravenous Stopped 12/24/22 1344)  potassium chloride 10 mEq in 100 mL IVPB (0 mEq Intravenous Stopped 12/24/22 1344)    Mobility walks     Focused Assessments     R Recommendations: See Admitting Provider Note  Report given to:   Additional Notes:

## 2022-12-25 NOTE — Progress Notes (Signed)
PROGRESS NOTE  Melinda Stone  DOB: 25-May-1980  PCP: Anselm Jungling, NP MVH:[id number]  DOA: 12/24/2022  LOS: 1 day  Hospital Day: 2  Brief narrative: Melinda Stone is a 42 y.o. female with PMH significant for DM2 on Ozempic, chronic iron deficiency and vitamin B12 anemia, peripheral neuropathy, chronic backache, h/o postlaminectomy, migraine headache. 10/4, patient was seen at [facility] ED for dizziness, weakness, blurry vision and whole body heaviness for 2 to 3 days.  She was not noted to have any focal neurological deficits.  Labs mostly unremarkable except for elevated blood glucose.  Symptoms were suspected to be secondary to dehydration and she was discharged home to follow-up with PCP/endocrinologist  10/7, patient presented at [facility] ED with complaint of 3 days of abdominal pain, nausea, vomiting, diarrhea, unable to hold anything down.  In the ED, patient is afebrile, tachycardic to 110s, blood pressure in 90s Resp virus panel negative for COVID, flu, RSV Blood glucose level elevated to 551, beta-hydroxybutyrate acid level elevated to 3.53 Blood gas with acidotic pH 7.3, bicarb low at 16, pCO2 33 Urinalysis showed hazy urine with trace leukocytes, rare bacteria CT abdomen pelvis did not show any acute findings. Patient was started on DKA protocol with IV insulin drip, IV fluid and electrolyte monitoring Admitted to [facility].  Subjective: Patient was seen and examined this morning. Young African-American female.  Lying on bed.  Not in distress.  Reports vomiting has stopped.  Diarrhea continues.  Reports 5 episodes since last night. Chart reviewed Overnight, blood sugar level remained stable under 200 Last set of blood work from this morning with potassium low at 3.3, renal function normal, anion gap closed, WBC count normal, hemoglobin low at 9.4 with MCV low at 74  Assessment and plan: DKA Presented with abdominal pain, nausea, vomiting, diarrhea, progressive weakness   Elevated blood glucose level, ketones level, low pH and wide anion gap suggestive of DKA  Started on DKA protocol with IV insulin drip, IV fluid Electrolytes and blood glucose level monitored Glucose level improved, anion gap closed Switch to subcu insulin today  Type 2 diabetes mellitus A1c 7.9 on July 2024 PTA meds-Jardiance, Ozempic, glipizide.  She states she has been on Ozempic for last 5 weeks, last dose on Saturday 10/5. I will hold Jardiance and glipizide at this time Start Semglee 10 units this morning with SSI/Accu-Cheks. Recent Labs  Lab 12/25/22 0717 12/25/22 0823 12/25/22 0942 12/25/22 1034 12/25/22 1232  GLUCAP 152* 151* 168* 160* 234*   Nausea, vomiting, diarrhea Nausea and vomiting has stopped.  Predominantly diarrhea now.  5 episodes since last night. Continue to monitor.  No fever or WBC count to suspect C. difficile.  URI  Negative COVID, flu and RSV CXR no active disease Likely allergies Continue Claritin, Robitussin DM as needed and Benadryl as needed for symptomatic treatment     Vitamin D deficiency Vitamin D level significantly low at 12.8.  Started on 50 units of vitamin D for 7 doses  Chronic anemia  iron deficiency, Vitamin B12 deficiency Follows up with hematologist Dr. Cathie Hoops at [facility].  On periodic IV iron vitamin B12 supplement Recent Labs    01/29/22 1427 02/06/22 1045 04/06/22 0913 05/05/22 0150 05/14/22 1103 05/14/22 1553 05/15/22 0439 05/15/22 1428 05/18/22 0946 05/30/22 1022 08/09/22 1443 12/17/22 1050 12/17/22 1051 12/21/22 2255 12/24/22 0613 12/24/22 0921 12/25/22 0522  HGB 9.6*   < > 10.9*   < >  --    < >   < >  --  9.8*   < > 11.0*  --  10.0* 12.1 12.0 11.2* 9.4*  MCV 82.9   < > 79.6*   < >  --    < >  --   --  85.3   < > 73.9*  --  74.3* 74.0* 75.0*  --  74.1*  VITAMINB12 249  --  390  --   --   --   --   --   --   --  428  --   --   --   --   --   --   FERRITIN 26  --  26  --  58  --   --   --   --   --  19  --  12   --   --   --   --   TIBC 447  --  466*  --  309  --   --   --   --   --  440  --  455*  --   --   --   --   IRON 188*  --  159  --  143  --   --   --   --   --  96  --  54  --   --   --   --   RETICCTPCT  --    < >  --   --  14.6*  --   --  14.0* 15.1*  --  9.9* 5.6*  --   --   --   --   --    < > = values in this interval not displayed.   Peripheral neuropathy continued gabapentin home dose   Chronic lower back pain,  Osteoarthritis s/p lumbar spine laminectomy Resumed home pain management  Migraine Imitrex PRN  Left ovarian cyst  Uterine fibroid 3.1 cm left ovarian cyst, previously 4 cm. 2.8 cm right fundal fibroid. Follows up with GYN as an outpatient   Mobility: Encourage ambulation  Goals of care   Code Status: Full Code     DVT prophylaxis:  enoxaparin (LOVENOX) injection 40 mg Start: 12/24/22 1330   Antimicrobials: None Fluid: NS at 75 mL/h Consultants: None Family Communication: Family not at bedside  Status: Inpatient Level of care:  Progressive   Patient is from: Home Needs to continue in-hospital care: Getting transition from IV insulin to subcutaneous insulin today Anticipated d/c to: Hopefully home in 1 to 2 days      Diet:  Diet Order             Diet Carb Modified Fluid consistency: Thin; Room service appropriate? Yes  Diet effective now                   Scheduled Meds:  amitriptyline  10 mg Oral QHS   enoxaparin (LOVENOX) injection  40 mg Subcutaneous Q24H   gabapentin  800 mg Oral QID   insulin aspart  0-5 Units Subcutaneous QHS   insulin aspart  0-9 Units Subcutaneous TID WC   insulin glargine-yfgn  15 Units Subcutaneous Daily   loratadine  10 mg Oral Daily   pantoprazole  40 mg Oral Daily   sodium chloride flush  3 mL Intravenous Q12H   Vitamin D (Ergocalciferol)  50,000 Units Oral Q7 days    PRN meds: sodium chloride, acetaminophen **OR** acetaminophen, albuterol, alum & mag hydroxide-simeth, calcium carbonate, dextrose,  diphenhydrAMINE, guaiFENesin-dextromethorphan, ondansetron **OR** ondansetron (ZOFRAN) IV, oxyCODONE-acetaminophen, sodium  chloride, sodium chloride flush, SUMAtriptan, tiZANidine   Infusions:   sodium chloride     sodium chloride 75 mL/hr at 12/25/22 0923   insulin Stopped (12/25/22 1000)   lactated ringers      Antimicrobials: Anti-infectives (From admission, onward)    None       Objective: Vitals:   12/25/22 0758 12/25/22 1036  BP: 123/64 139/79  Pulse: 90 93  Resp: 12   Temp: 98.4 F (36.9 C) 98 F (36.7 C)  SpO2: 98% 100%   No intake or output data in the 24 hours ending 12/25/22 1416  Filed Weights   12/24/22 0603  Weight: 89.4 kg   Weight change:  Body mass index is 29.09 kg/m.   Physical Exam: General exam: Pleasant, young Skin: No rashes, lesions or ulcers. HEENT: Atraumatic, normocephalic, no obvious bleeding Lungs: Clear to auscultation bilaterally CVS: Regular rate and rhythm, no murmur GI/Abd soft, nontender nondistended, bowel sound present CNS: Alert, awake, oriented x 3 Psychiatry: Mood appropriate Extremities: No pedal edema, no calf tenderness  Data Review: I have personally reviewed the laboratory data and studies available.  F/u labs ordered Unresulted Labs (From admission, onward)     Start     Ordered   12/25/22 0500  Basic metabolic panel  Daily,   R      12/24/22 1319   12/25/22 0500  CBC  Daily,   R      12/24/22 1319   12/25/22 0500  Magnesium  Daily,   R      12/24/22 1319   12/25/22 0500  Phosphorus  Daily,   R      12/24/22 1319            Total time spent in review of labs and imaging, patient evaluation, formulation of plan, documentation and communication with family: 55 minutes  Signed, Lorin Glass, MD [facility] 12/25/2022

## 2022-12-26 DIAGNOSIS — E1110 Type 2 diabetes mellitus with ketoacidosis without coma: Secondary | ICD-10-CM | POA: Diagnosis not present

## 2022-12-26 LAB — GLUCOSE, CAPILLARY
Glucose-Capillary: 233 mg/dL — ABNORMAL HIGH (ref 70–99)
Glucose-Capillary: 298 mg/dL — ABNORMAL HIGH (ref 70–99)
Glucose-Capillary: 281 mg/dL — ABNORMAL HIGH (ref 70–99)
Glucose-Capillary: 235 mg/dL — ABNORMAL HIGH (ref 70–99)

## 2022-12-26 LAB — URINE CULTURE: Culture: >=100000 — AB

## 2022-12-26 LAB — BASIC METABOLIC PANEL
Anion gap: 5 (ref 5–15)
BUN: 7 mg/dL (ref 6–20)
CO2: 20 mmol/L — ABNORMAL LOW (ref 22–32)
Calcium: 8.1 mg/dL — ABNORMAL LOW (ref 8.9–10.3)
Chloride: 110 mmol/L (ref 98–111)
Creatinine, Ser: 0.7 mg/dL (ref 0.44–1.00)
GFR, Estimated: >60 mL/min (ref >60)
Glucose, Bld: 299 mg/dL — ABNORMAL HIGH (ref 70–99)
Potassium: 3.5 mmol/L (ref 3.5–5.1)
Sodium: 135 mmol/L (ref 135–145)

## 2022-12-26 LAB — CBC
HCT: 26.1 % — ABNORMAL LOW (ref 36.0–46.0)
Hemoglobin: 9 g/dL — ABNORMAL LOW (ref 12.0–15.0)
MCH: 25.9 pg — ABNORMAL LOW (ref 26.0–34.0)
MCHC: 34.5 g/dL (ref 30.0–36.0)
MCV: 75 fL — ABNORMAL LOW (ref 80.0–100.0)
Platelets: 160 10*3/uL (ref 150–400)
RBC: 3.48 MIL/uL — ABNORMAL LOW (ref 3.87–5.11)
RDW: 22.1 % — ABNORMAL HIGH (ref 11.5–15.5)
WBC: 5 10*3/uL (ref 4.0–10.5)
nRBC: 0 % (ref 0.0–0.2)

## 2022-12-26 LAB — MAGNESIUM: Magnesium: 1.9 mg/dL (ref 1.7–2.4)

## 2022-12-26 LAB — PHOSPHORUS: Phosphorus: 3.3 mg/dL (ref 2.5–4.6)

## 2022-12-26 MED ORDER — POTASSIUM CHLORIDE CRYS ER 20 MEQ PO TBCR
40.0000 meq | EXTENDED_RELEASE_TABLET | Freq: Once | ORAL | Status: AC
  Administered 2022-12-26: 40 meq via ORAL
  Filled 2022-12-26: qty 2

## 2022-12-26 MED ORDER — INSULIN GLARGINE-YFGN 100 UNIT/ML ~~LOC~~ SOLN
22.0000 [IU] | Freq: Every day | SUBCUTANEOUS | Status: DC
  Administered 2022-12-26: 22 [IU] via SUBCUTANEOUS
  Filled 2022-12-26 (×2): qty 0.22

## 2022-12-26 MED ORDER — VITAMIN B-12 1000 MCG PO TABS
1000.0000 ug | ORAL_TABLET | Freq: Every day | ORAL | Status: DC
  Administered 2022-12-26 – 2022-12-27 (×2): 1000 ug via ORAL
  Filled 2022-12-26 (×2): qty 1

## 2022-12-26 MED ORDER — INSULIN ASPART 100 UNIT/ML IJ SOLN
0.0000 [IU] | Freq: Three times a day (TID) | INTRAMUSCULAR | Status: DC
  Administered 2022-12-26: 8 [IU] via SUBCUTANEOUS
  Administered 2022-12-26: 5 [IU] via SUBCUTANEOUS
  Administered 2022-12-26: 8 [IU] via SUBCUTANEOUS
  Administered 2022-12-27: 5 [IU] via SUBCUTANEOUS

## 2022-12-26 MED ORDER — INSULIN ASPART 100 UNIT/ML IJ SOLN
0.0000 [IU] | Freq: Every day | INTRAMUSCULAR | Status: DC
  Administered 2022-12-26: 2 [IU] via SUBCUTANEOUS

## 2022-12-26 NOTE — Progress Notes (Signed)
PROGRESS NOTE                                                                                                                                                                                                             Patient Demographics:    Melinda Stone, is a 42 y.o. female, DOB - [birth year], SWF:[id number]  Outpatient Primary MD for the patient is Anselm Jungling, NP    LOS - 2  Admit date - 12/24/2022    Chief Complaint  Patient presents with   Abdominal Pain       Brief Narrative (HPI from H&P)   Melinda Stone is a 42 y.o. female with PMH significant for DM2 on Ozempic, chronic iron deficiency and vitamin B12 anemia, peripheral neuropathy, chronic backache, h/o postlaminectomy, migraine headache. 10/4, patient was seen at [facility] ED for dizziness, weakness, blurry vision and whole body heaviness for 2 to 3 days.  She was not noted to have any focal neurological deficits.  Labs mostly unremarkable except for elevated blood glucose.  Symptoms were suspected to be secondary to dehydration and she was discharged home to follow-up with PCP/endocrinologist, she presented again with generalized weakness, abdominal pain nausea vomiting and diarrhea along with some dry cough.  Was diagnosed with DKA and viral URI plus gastroenteritis and admitted to the hospital.   Subjective:    Melinda Stone today has, No headache, No chest pain, No abdominal pain -but does have mild intermittent nausea, No new weakness tingling or numbness, no SOB   Assessment  & Plan :    DKA in a patient with DM type II. Likely brought in by viral gastroenteritis and URI, was placed on IV insulin drip and IV fluids, DKA seems to have resolved, transition to [facility] and sliding scale, diabetic insulin education and monitor.  Lab Results  Component Value Date   HGBA1C 7.9 (A) 10/16/2022   CBG (last 3)  Recent Labs    12/25/22 1722  12/25/22 2131 12/26/22 0807  GLUCAP 280* 338* 233*     Viral URI and gastroenteritis nausea, vomiting, diarrhea Nausea, diarrhea improved, likely viral gastroenteritis, continue to monitor.  No fever or leukocytosis.  Monitor with supportive care.   Vitamin D deficiency Vitamin D level significantly low at 12.8.  Started on 50 units of vitamin D for 7 doses   Chronic  anemia  iron deficiency, Vitamin B12 deficiency Follows up with hematologist Dr. Cathie Hoops at [facility].  On periodic IV iron vitamin B12 supplement  Peripheral neuropathy continued gabapentin home dose   Chronic lower back pain,  Osteoarthritis s/p lumbar spine laminectomy Resumed home pain management   Migraine Imitrex PRN   Left ovarian cyst  Uterine fibroid 3.1 cm left ovarian cyst, previously 4 cm. 2.8 cm right fundal fibroid. Follows up with GYN as an outpatient      Condition - Fair  Family Communication  :  None  Code Status :  Full  Consults  :  None  PUD Prophylaxis :     Procedures  :     CT - 1. No acute findings. 2. 3.1 cm left ovarian cyst, previously 4 cm. No follow-up imaging recommended. Note: This recommendation does not apply to premenarchal patients and to those with increased risk (genetic, family history, elevated tumor markers or other high-risk factors) of ovarian cancer. Reference: JACR 2020 Feb; 17(2):248-254 3. 2.8 cm right fundal fibroid.      Disposition Plan  :    Status is: Inpatient   DVT Prophylaxis  :    enoxaparin (LOVENOX) injection 40 mg Start: 12/24/22 1330     Lab Results  Component Value Date   PLT 160 12/26/2022    Diet :  Diet Order             Diet Carb Modified Fluid consistency: Thin; Room service appropriate? Yes  Diet effective now                    Inpatient Medications  Scheduled Meds:  amitriptyline  10 mg Oral QHS   enoxaparin (LOVENOX) injection  40 mg Subcutaneous Q24H   gabapentin  800 mg Oral QID   insulin aspart  0-15  Units Subcutaneous TID WC   insulin aspart  0-5 Units Subcutaneous QHS   insulin glargine-yfgn  22 Units Subcutaneous Daily   loratadine  10 mg Oral Daily   pantoprazole  40 mg Oral Daily   Vitamin D (Ergocalciferol)  50,000 Units Oral Q7 days   Continuous Infusions: PRN Meds:.albuterol, alum & mag hydroxide-simeth, calcium carbonate, dextrose, diphenhydrAMINE, guaiFENesin-dextromethorphan, [DISCONTINUED] ondansetron **OR** ondansetron (ZOFRAN) IV, oxyCODONE-acetaminophen, sodium chloride, SUMAtriptan, tiZANidine  Antibiotics  :    Anti-infectives (From admission, onward)    None         Objective:   Vitals:   12/25/22 1928 12/25/22 2300 12/26/22 0434 12/26/22 0820  BP: 137/79 118/72  138/86  Pulse: 95 97 93 94  Resp: 18 18 18 17   Temp: 98.2 F (36.8 C) 98.1 F (36.7 C) 98.2 F (36.8 C) 97.9 F (36.6 C)  TempSrc: Oral Oral Oral Oral  SpO2: 100% 100% 100%   Weight:      Height:        Wt Readings from Last 3 Encounters:  12/24/22 89.4 kg  12/21/22 89.6 kg  12/18/22 90.1 kg     Intake/Output Summary (Last 24 hours) at 12/26/2022 1057 Last data filed at 12/26/2022 0600 Gross per 24 hour  Intake 480 ml  Output --  Net 480 ml     Physical Exam  Awake Alert, No new F.N deficits, Normal affect [place].AT,PERRAL Supple Neck, No JVD,   Symmetrical Chest wall movement, Good air movement bilaterally, CTAB RRR,No Gallops,Rubs or new Murmurs,  +ve B.Sounds, Abd Soft, No tenderness,   No Cyanosis, Clubbing or edema      Data Review:  Recent Labs  Lab 12/21/22 2255 12/24/22 0613 12/24/22 0921 12/25/22 0522 12/26/22 0346  WBC 11.2* 10.9*  --  5.2 5.0  HGB 12.1 12.0 11.2* 9.4* 9.0*  HCT 34.8* 34.8* 33.0* 27.8* 26.1*  PLT 190 186  --  165 160  MCV 74.0* 75.0*  --  74.1* 75.0*  MCH 25.7* 25.9*  --  25.1* 25.9*  MCHC 34.8 34.5  --  33.8 34.5  RDW 21.0* 21.5*  --  21.7* 22.1*    Recent Labs  Lab 12/24/22 0613 12/24/22 0921 12/24/22 1444 12/24/22 1648  12/24/22 1815 12/25/22 0522 12/26/22 0346  NA 134*   < > 134* 134* 134* 135 135  K 4.2   < > 3.8 3.5 3.5 3.3* 3.5  CL 101   < > 104 101 105 106 110  CO2 16*   < > 19* 19* 20* 23 20*  ANIONGAP 17*   < > 11 14 9 6 5   GLUCOSE 267*   < > 123* 166* 170* 157* 299*  BUN 16   < > 11 12 7 8 7   CREATININE 1.02*   < > 0.71 0.82 0.84 0.66 0.70  AST 13*  --   --   --   --   --   --   ALT 11  --   --   --   --   --   --   ALKPHOS 93  --   --   --   --   --   --   BILITOT 2.1*  --   --   --   --   --   --   ALBUMIN 3.8  --   --   --   --   --   --   MG  --   --  2.0  --   --  2.0 1.9  CALCIUM 8.8*   < > 8.2* 8.4* 8.3* 8.4* 8.1*   < > = values in this interval not displayed.      Recent Labs  Lab 12/24/22 1444 12/24/22 1648 12/24/22 1815 12/25/22 0522 12/26/22 0346  MG 2.0  --   --  2.0 1.9  CALCIUM 8.2* 8.4* 8.3* 8.4* 8.1*    --------------------------------------------------------------------------------------------------------------- Lab Results  Component Value Date   CHOL 96 (L) 04/28/2020   HDL 55 04/28/2020   LDLCALC 23 04/28/2020   TRIG 97 04/28/2020   CHOLHDL 1.7 04/28/2020    Lab Results  Component Value Date   HGBA1C 7.9 (A) 10/16/2022   No results for input(s): "TSH", "T4TOTAL", "FREET4", "T3FREE", "THYROIDAB" in the last 72 hours. No results for input(s): "VITAMINB12", "FOLATE", "FERRITIN", "TIBC", "IRON", "RETICCTPCT" in the last 72 hours. ------------------------------------------------------------------------------------------------------------------ Cardiac Enzymes No results for input(s): "CKMB", "TROPONINI", "MYOGLOBIN" in the last 168 hours.  Invalid input(s): "CK"    Radiology Reports DG Chest Portable 1 View  Result Date: 12/24/2022 CLINICAL DATA:  Cough.  Lower abdominal pain. EXAM: PORTABLE CHEST 1 VIEW COMPARISON:  05/13/2022. FINDINGS: Low lung volume. Bilateral lung fields are clear. No dense consolidation or lung collapse. Bilateral  costophrenic angles are clear. Normal cardio-mediastinal silhouette. No acute osseous abnormalities. The soft tissues are within normal limits. IMPRESSION: No active disease. Electronically Signed   By: Jules Schick M.D.   On: 12/24/2022 14:19   CT ABDOMEN PELVIS W CONTRAST  Result Date: 12/24/2022 CLINICAL DATA:  Acute abdominal pain EXAM: CT ABDOMEN AND PELVIS WITH CONTRAST TECHNIQUE: Multidetector CT imaging of the abdomen and pelvis was  performed using the standard protocol following bolus administration of intravenous contrast. RADIATION DOSE REDUCTION: This exam was performed according to the departmental dose-optimization program which includes automated exposure control, adjustment of the mA and/or kV according to patient size and/or use of iterative reconstruction technique. CONTRAST:  75mL OMNIPAQUE IOHEXOL 350 MG/ML SOLN COMPARISON:  05/13/2022 FINDINGS: Lower chest: Small pleural effusion seen previously have resolved. No pericardial effusion. Visualized lung bases clear. Hepatobiliary: No focal liver abnormality is seen. Status post cholecystectomy. No biliary dilatation. Pancreas: Unremarkable. No pancreatic ductal dilatation or surrounding inflammatory changes. Spleen: Normal in size without focal abnormality. Adrenals/Urinary Tract: Adrenal glands are unremarkable. Kidneys are normal, without renal calculi, focal lesion, or hydronephrosis. Bladder is unremarkable. Stomach/Bowel: Stomach and small bowel nondistended, unremarkable. Normal appendix. The colon is partially distend, without acute finding. Vascular/Lymphatic: No significant vascular findings are present. No enlarged abdominal or pelvic lymph nodes. Reproductive: 2.8 cm right fundal fibroid. 3.1 cm left ovarian cyst, previously 4 cm. Other: No ascites.  No free air. Musculoskeletal: Degenerative disc disease L4-5, post posterior decompression. IMPRESSION: 1. No acute findings. 2. 3.1 cm left ovarian cyst, previously 4 cm. No follow-up  imaging recommended. Note: This recommendation does not apply to premenarchal patients and to those with increased risk (genetic, family history, elevated tumor markers or other high-risk factors) of ovarian cancer. Reference: JACR 2020 Feb; 17(2):248-254 3. 2.8 cm right fundal fibroid. Electronically Signed   By: Corlis Leak M.D.   On: 12/24/2022 12:28      Signature  -   Susa Raring M.D on 12/26/2022 at 10:57 AM   -  To page go to [email or web address]

## 2022-12-26 NOTE — Plan of Care (Signed)
  Problem: Coping: Goal: Ability to adjust to condition or change in health will improve Outcome: Progressing   Problem: Fluid Volume: Goal: Ability to maintain a balanced intake and output will improve Outcome: Progressing   Problem: Metabolic: Goal: Ability to maintain appropriate glucose levels will improve Outcome: Progressing   Problem: Clinical Measurements: Goal: Ability to maintain clinical measurements within normal limits will improve Outcome: Progressing

## 2022-12-26 NOTE — Plan of Care (Signed)
Pt has rested quietly throughout the night with no distress noted. Alert and oriented. On room air. SR on the monitor. Up to BR independently. Medicated for pain and cough with relief noted. No other complaints voiced.     Problem: Education: Goal: Ability to describe self-care measures that may prevent or decrease complications (Diabetes Survival Skills Education) will improve Outcome: Progressing Goal: Individualized Educational Video(s) Outcome: Progressing   Problem: Coping: Goal: Ability to adjust to condition or change in health will improve Outcome: Progressing   Problem: Fluid Volume: Goal: Ability to maintain a balanced intake and output will improve Outcome: Progressing   Problem: Health Behavior/Discharge Planning: Goal: Ability to identify and utilize available resources and services will improve Outcome: Progressing Goal: Ability to manage health-related needs will improve Outcome: Progressing   Problem: Metabolic: Goal: Ability to maintain appropriate glucose levels will improve Outcome: Progressing   Problem: Nutritional: Goal: Maintenance of adequate nutrition will improve Outcome: Progressing Goal: Progress toward achieving an optimal weight will improve Outcome: Progressing   Problem: Skin Integrity: Goal: Risk for impaired skin integrity will decrease Outcome: Progressing   Problem: Tissue Perfusion: Goal: Adequacy of tissue perfusion will improve Outcome: Progressing   Problem: Education: Goal: Knowledge of General Education information will improve Description: Including pain rating scale, medication(s)/side effects and non-pharmacologic comfort measures Outcome: Progressing   Problem: Health Behavior/Discharge Planning: Goal: Ability to manage health-related needs will improve Outcome: Progressing   Problem: Clinical Measurements: Goal: Ability to maintain clinical measurements within normal limits will improve Outcome: Progressing Goal:  Will remain free from infection Outcome: Progressing Goal: Diagnostic test results will improve Outcome: Progressing Goal: Respiratory complications will improve Outcome: Progressing Goal: Cardiovascular complication will be avoided Outcome: Progressing   Problem: Activity: Goal: Risk for activity intolerance will decrease Outcome: Progressing   Problem: Nutrition: Goal: Adequate nutrition will be maintained Outcome: Progressing   Problem: Coping: Goal: Level of anxiety will decrease Outcome: Progressing   Problem: Elimination: Goal: Will not experience complications related to bowel motility Outcome: Progressing Goal: Will not experience complications related to urinary retention Outcome: Progressing   Problem: Pain Managment: Goal: General experience of comfort will improve Outcome: Progressing   Problem: Safety: Goal: Ability to remain free from injury will improve Outcome: Progressing   Problem: Skin Integrity: Goal: Risk for impaired skin integrity will decrease Outcome: Progressing

## 2022-12-26 NOTE — Progress Notes (Signed)
   12/26/22 1315  TOC Brief Assessment  Insurance and Status Reviewed (Medicaid/ [employer] Community)  Patient has primary care physician Yes (Hatchett, Mary, NP)  Home environment has been reviewed From home with multiple family members  Prior level of function: mostly independent   uses cane at baseline  Prior/Current Home Services No current home services  Social Determinants of Health Reivew SDOH reviewed interventions complete (smoking cessation information attached to DC instructions)  Readmission risk has been reviewed Yes (38%)  Transition of care needs transition of care needs identified, TOC will continue to follow   TOC will continue to follow patient for any additional discharge needs

## 2022-12-26 NOTE — Evaluation (Signed)
Physical Therapy Evaluation Patient Details Name: Melinda Stone MRN: [id number] DOB: [birth year] Today's Date: 12/26/2022  History of Present Illness  Patient is a 42 year old female with DKA, URI, abdominal pain, nausea, vomiting, diarrhea, progressive weakness. History of type 2 diabetes mellitus, anemia, peripheral neuropathy, chronic back pain and lumbar spine surgery.  Clinical Impression  Patient is agreeable to PT evaluation. She lives with multiple family members/friends in an apartment with no steps to enter. She is ambulatory with a cane at baseline with history of chronic pain and peripheral neuropathy limiting community distance ambulation.  Today, the patient is modified independent with all activity. Hallway ambulation with no loss of balance using IV pole for support. Recommend to continue using cane in home setting as needed.  No dyspnea with exertion with heart rate 103bpm after walking. No anticipated PT needs at this time. PT will sign off        If plan is discharge home, recommend the following: Assist for transportation     Equipment Recommendations None recommended by PT     Functional Status Assessment Patient has not had a recent decline in their functional status     Precautions / Restrictions Precautions Precautions:  (low fall risk) Restrictions Weight Bearing Restrictions: No      Mobility  Bed Mobility               General bed mobility comments: not observed    Transfers Overall transfer level: Modified independent (increased time)                      Ambulation/Gait Ambulation/Gait assistance: Modified independent (Device/Increase time) Gait Distance (Feet): 120 Feet Assistive device: IV Pole Gait Pattern/deviations: Step-through pattern Gait velocity: decreased Gait velocity interpretation: 1.31 - 2.62 ft/sec, indicative of limited community ambulator   General Gait Details: patient ambulated in hallway with no loss  of balance using IV pole. she ambulated short distances in the room without device and no loss of balance  Stairs            Wheelchair Mobility     Tilt Bed    Modified Rankin (Stroke Patients Only)       Balance Overall balance assessment: No apparent balance deficits (not formally assessed)                                           Pertinent Vitals/Pain Pain Assessment Pain Assessment: Faces Faces Pain Scale: Hurts little more Pain Location: chronic low back and leg pain Pain Descriptors / Indicators: Discomfort Pain Intervention(s): Limited activity within patient's tolerance, Monitored during session    Home Living Family/patient expects to be discharged to:: Private residence Living Arrangements: Children (6 children currently in the home, family and friends) Available Help at Discharge: Family Type of Home: Apartment Home Access: Level entry       Home Layout: One level Home Equipment: Gilmer Mor - single point      Prior Function Prior Level of Function : Independent/Modified Independent             Mobility Comments: Mod I with cane. uses a cart at the grocery store. limited community distance secondary to chronic pain       Extremity/Trunk Assessment   Upper Extremity Assessment Upper Extremity Assessment: Overall WFL for tasks assessed    Lower Extremity Assessment Lower Extremity Assessment: RLE  deficits/detail;LLE deficits/detail RLE Deficits / Details: grossly WFL for tasks assessed RLE Sensation: history of peripheral neuropathy LLE Deficits / Details: grossly WFL for taks assessed LLE Sensation: history of peripheral neuropathy       Communication   Communication Communication: No apparent difficulties  Cognition Arousal: Alert Behavior During Therapy: WFL for tasks assessed/performed Overall Cognitive Status: Within Functional Limits for tasks assessed                                           General Comments General comments (skin integrity, edema, etc.): no dyspnea noted with exertion. heart rate 103bpm after walking    Exercises     Assessment/Plan    PT Assessment Patient does not need any further PT services  PT Problem List         PT Treatment Interventions      PT Goals (Current goals can be found in the Care Plan section)  Acute Rehab PT Goals PT Goal Formulation: All assessment and education complete, DC therapy    Frequency       Co-evaluation               AM-PAC PT "6 Clicks" Mobility  Outcome Measure Help needed turning from your back to your side while in a flat bed without using bedrails?: None Help needed moving from lying on your back to sitting on the side of a flat bed without using bedrails?: None Help needed moving to and from a bed to a chair (including a wheelchair)?: None Help needed standing up from a chair using your arms (e.g., wheelchair or bedside chair)?: None Help needed to walk in hospital room?: None Help needed climbing 3-5 steps with a railing? : None 6 Click Score: 24    End of Session   Activity Tolerance: Patient tolerated treatment well Patient left: in chair;with call bell/phone within reach Nurse Communication: Mobility status      Time: 1201-1217 PT Time Calculation (min) (ACUTE ONLY): 16 min   Charges:   PT Evaluation $PT Eval Low Complexity: 1 Low   PT General Charges $$ ACUTE PT VISIT: 1 Visit         Donna Bernard, PT, MPT   Ina Homes 12/26/2022, 12:21 PM

## 2022-12-27 ENCOUNTER — Other Ambulatory Visit (HOSPITAL_COMMUNITY): Payer: Self-pay

## 2022-12-27 ENCOUNTER — Inpatient Hospital Stay: Payer: Medicaid Other

## 2022-12-27 DIAGNOSIS — E1110 Type 2 diabetes mellitus with ketoacidosis without coma: Secondary | ICD-10-CM | POA: Diagnosis not present

## 2022-12-27 LAB — BASIC METABOLIC PANEL
Anion gap: 10 (ref 5–15)
BUN: 5 mg/dL — ABNORMAL LOW (ref 6–20)
CO2: 20 mmol/L — ABNORMAL LOW (ref 22–32)
Calcium: 8.6 mg/dL — ABNORMAL LOW (ref 8.9–10.3)
Chloride: 107 mmol/L (ref 98–111)
Creatinine, Ser: 0.67 mg/dL (ref 0.44–1.00)
GFR, Estimated: >60 mL/min (ref >60)
Glucose, Bld: 233 mg/dL — ABNORMAL HIGH (ref 70–99)
Potassium: 3.9 mmol/L (ref 3.5–5.1)
Sodium: 137 mmol/L (ref 135–145)

## 2022-12-27 LAB — CBC
HCT: 26.4 % — ABNORMAL LOW (ref 36.0–46.0)
Hemoglobin: 8.9 g/dL — ABNORMAL LOW (ref 12.0–15.0)
MCH: 25.7 pg — ABNORMAL LOW (ref 26.0–34.0)
MCHC: 33.7 g/dL (ref 30.0–36.0)
MCV: 76.3 fL — ABNORMAL LOW (ref 80.0–100.0)
Platelets: 148 10*3/uL — ABNORMAL LOW (ref 150–400)
RBC: 3.46 MIL/uL — ABNORMAL LOW (ref 3.87–5.11)
RDW: 22.5 % — ABNORMAL HIGH (ref 11.5–15.5)
WBC: 5.9 10*3/uL (ref 4.0–10.5)
nRBC: 0 % (ref 0.0–0.2)

## 2022-12-27 LAB — MAGNESIUM: Magnesium: 1.8 mg/dL (ref 1.7–2.4)

## 2022-12-27 LAB — PHOSPHORUS: Phosphorus: 3.3 mg/dL (ref 2.5–4.6)

## 2022-12-27 LAB — GLUCOSE, CAPILLARY: Glucose-Capillary: 249 mg/dL — ABNORMAL HIGH (ref 70–99)

## 2022-12-27 MED ORDER — METOPROLOL TARTRATE 25 MG PO TABS
25.0000 mg | ORAL_TABLET | Freq: Two times a day (BID) | ORAL | Status: DC
  Administered 2022-12-27: 25 mg via ORAL
  Filled 2022-12-27: qty 1

## 2022-12-27 MED ORDER — INSULIN GLARGINE-YFGN 100 UNIT/ML ~~LOC~~ SOLN
30.0000 [IU] | Freq: Every day | SUBCUTANEOUS | Status: DC
  Administered 2022-12-27: 30 [IU] via SUBCUTANEOUS
  Filled 2022-12-27: qty 0.3

## 2022-12-27 MED ORDER — ONDANSETRON 4 MG PO TBDP
4.0000 mg | ORAL_TABLET | Freq: Three times a day (TID) | ORAL | 0 refills | Status: DC | PRN
  Filled 2022-12-27: qty 20, 7d supply, fill #0

## 2022-12-27 MED ORDER — LANTUS SOLOSTAR 100 UNIT/ML ~~LOC~~ SOPN
30.0000 [IU] | PEN_INJECTOR | Freq: Every day | SUBCUTANEOUS | 0 refills | Status: DC
Start: 2022-12-27 — End: 2023-05-28
  Filled 2022-12-27: qty 9, 30d supply, fill #0

## 2022-12-27 MED ORDER — INSULIN PEN NEEDLE 32G X 4 MM MISC
0 refills | Status: DC
Start: 2022-12-27 — End: 2022-12-27
  Filled 2022-12-27: qty 100, 25d supply, fill #0

## 2022-12-27 MED ORDER — METOPROLOL TARTRATE 25 MG PO TABS
25.0000 mg | ORAL_TABLET | Freq: Two times a day (BID) | ORAL | Status: DC
Start: 2022-12-27 — End: 2022-12-27

## 2022-12-27 MED ORDER — INSULIN GLARGINE-YFGN 100 UNIT/ML ~~LOC~~ SOLN: 28.0000 [IU] | Freq: Every day | SUBCUTANEOUS | Status: DC

## 2022-12-27 MED ORDER — INSULIN ASPART 100 UNIT/ML FLEXPEN
PEN_INJECTOR | SUBCUTANEOUS | 0 refills | Status: DC
Start: 2022-12-27 — End: 2023-05-29
  Filled 2022-12-27: qty 15, 41d supply, fill #0

## 2022-12-27 MED ORDER — METOPROLOL TARTRATE 25 MG PO TABS
25.0000 mg | ORAL_TABLET | Freq: Two times a day (BID) | ORAL | 0 refills | Status: DC
  Filled 2022-12-27: qty 60, 30d supply, fill #0

## 2022-12-27 NOTE — Discharge Summary (Signed)
[name] Stone:[id number] DOB: March 15, 1981 DOA: 12/24/2022  PCP: Anselm Jungling, NP  Admit date: 12/24/2022  Discharge date: 12/27/2022  Admitted From: Home   Disposition:  Home   Recommendations for Outpatient Follow-up:   Follow up with PCP in 1-2 weeks  PCP Please obtain BMP/CBC, 2 view CXR in 1week,  (see Discharge instructions)   PCP Please follow up on the following pending results: Monitor BMP, A1c, blood pressure, vitamin D, B12 levels, glycemic control, needs outpatient endocrine and GYN follow-up.  Review CT abdomen pelvis results.   Home Health: None   Equipment/Devices: None  Consultations: None  Discharge Condition: Stable    CODE STATUS: Full    Diet Recommendation: Heart Healthy Low Carb    Chief Complaint  Patient presents with   Abdominal Pain     Brief history of present illness from the day of admission and additional interim summary    42 y.o. female with PMH significant for DM2 on Ozempic, chronic iron deficiency and vitamin B12 anemia, peripheral neuropathy, chronic backache, h/o postlaminectomy, migraine headache. 10/4, patient was seen at [facility] ED for dizziness, weakness, blurry vision and whole body heaviness for 2 to 3 days.  She was not noted to have any focal neurological deficits.  Labs mostly unremarkable except for elevated blood glucose.  Symptoms were suspected to be secondary to dehydration and she was discharged home to follow-up with PCP/endocrinologist, she presented again with generalized weakness, abdominal pain nausea vomiting and diarrhea along with some dry cough.  Was diagnosed with DKA and viral URI plus gastroenteritis and admitted to the hospital.                                                                 Hospital Course   DKA in a patient with DM type  II. Likely brought in by viral gastroenteritis and URI, was placed on IV insulin drip and IV fluids, DKA seems to have resolved, transition to [facility] and sliding scale, diabetic insulin education was provided, she is well versed with taking insulin has taken it in the past and has testing supplies.  Will be provided 1 month supply of insulin, oral hypoglycemic will be discontinued, continue Jardiance, PCP to monitor closely her glycemic control in the outpatient setting.  Patient is currently relatively symptom-free and eager to go home.  Lab Results  Component Value Date   HGBA1C 7.9 (A) 10/16/2022    Viral URI and gastroenteritis nausea, vomiting, diarrhea Nausea, diarrhea improved, likely viral gastroenteritis, no fever or leukocytosis symptoms largely resolved, some chronic nausea and abdominal pain as well, continue supportive care upon discharge and follow-up with PCP.   Vitamin D deficiency Vitamin D level significantly low at 12.8.  MND was given here, PCP to monitor in the outpatient setting.   Chronic  anemia  iron deficiency, Vitamin B12 deficiency Follows up with hematologist Dr. Cathie Hoops at [facility]  Continue B12 supplementation as being done in the outpatient setting .   Peripheral neuropathy continued gabapentin home dose   Chronic lower back pain,  Osteoarthritis s/p lumbar spine laminectomy Resumed home pain management   Migraine Imitrex PRN   Left ovarian cyst  Uterine fibroid 3.1 cm left ovarian cyst, previously 4 cm. 2.8 cm right fundal fibroid. Follows up with GYN as an outpatient    Discharge diagnosis     Principal Problem:   DKA (diabetic ketoacidosis) (HCC) Active Problems:   DKA, type 2 (HCC)    Discharge instructions      Discharge Medications   Allergies as of 12/27/2022       Reactions   Trulicity [dulaglutide] Diarrhea        Medication List     STOP taking these medications    glipiZIDE 10 MG tablet Commonly known as:  GLUCOTROL       TAKE these medications    acetaminophen 500 MG tablet Commonly known as: TYLENOL Take 500 mg by mouth every 6 (six) hours as needed for mild pain or moderate pain.   amitriptyline 10 MG tablet Commonly known as: ELAVIL Take 20 mg by mouth at bedtime.   cyanocobalamin 1000 MCG tablet Commonly known as: VITAMIN B12 Take 1 tablet (1,000 mcg total) by mouth daily.   Emgality 120 MG/ML Soaj Generic drug: Galcanezumab-gnlm Inject 120 mg into the skin every 30 (thirty) days.   empagliflozin 25 MG Tabs tablet Commonly known as: Jardiance Take 1 tablet (25 mg total) by mouth daily before breakfast.   gabapentin 800 MG tablet Commonly known as: NEURONTIN Take 800 mg by mouth in the morning, at noon, in the evening, and at bedtime.   insulin aspart 100 UNIT/ML FlexPen Commonly known as: NOVOLOG Before each meal 3 times a day, 140-199 - 2 units, 200-250 - 4 units, 251-299 - 8 units,  300-349 - 10 units,  350 or above 12 units. Insulin PEN if approved, provide syringes and needles if needed.Please switch to any approved short acting Insulin if needed.   insulin glargine 100 UNIT/ML injection Commonly known as: Lantus Inject 0.3 mLs (30 Units total) into the skin at bedtime. Dispense insulin pen if approved, if not dispense as needed syringes and needles for 1 month supply. Can switch to any approved Long acting Insulin.   metoprolol tartrate 25 MG tablet Commonly known as: LOPRESSOR Take 1 tablet (25 mg total) by mouth 2 (two) times daily.   ondansetron 4 MG disintegrating tablet Commonly known as: ZOFRAN-ODT Take 1 tablet (4 mg total) by mouth every 8 (eight) hours as needed for nausea or vomiting.   oxyCODONE-acetaminophen 5-325 MG tablet Commonly known as: PERCOCET/ROXICET Take 1 tablet by mouth every 6 (six) hours as needed for moderate pain or severe pain.   pantoprazole 40 MG tablet Commonly known as: PROTONIX Take 1 tablet (40 mg total) by mouth  daily.   Semaglutide(0.25 or 0.5MG /DOS) 2 MG/3ML Sopn Inject 0.5 mg into the skin once a week.   SUMAtriptan 50 MG tablet Commonly known as: Imitrex Take 1 tablet (50 mg total) by mouth every 2 (two) hours as needed for migraine. May repeat in 2 hours if headache persists or recurs.   tiZANidine 4 MG tablet Commonly known as: ZANAFLEX Take 4 mg by mouth in the morning and at bedtime.   vitamin C 250 MG tablet Commonly known as: ASCORBIC  ACID Take 2 tablets (500 mg total) by mouth daily. What changed: how much to take         Follow-up Information     Anselm Jungling, NP. Schedule an appointment as soon as possible for a visit in 1 week(s).   Specialty: Nurse Practitioner Contact information: [address] [address] [place] [address] [phone]                 Major procedures and Radiology Reports - PLEASE review detailed and final reports thoroughly  -        DG Chest Portable 1 View  Result Date: 12/24/2022 CLINICAL DATA:  Cough.  Lower abdominal pain. EXAM: PORTABLE CHEST 1 VIEW COMPARISON:  05/13/2022. FINDINGS: Low lung volume. Bilateral lung fields are clear. No dense consolidation or lung collapse. Bilateral costophrenic angles are clear. Normal cardio-mediastinal silhouette. No acute osseous abnormalities. The soft tissues are within normal limits. IMPRESSION: No active disease. Electronically Signed   By: Jules Schick M.D.   On: 12/24/2022 14:19   CT ABDOMEN PELVIS W CONTRAST  Result Date: 12/24/2022 CLINICAL DATA:  Acute abdominal pain EXAM: CT ABDOMEN AND PELVIS WITH CONTRAST TECHNIQUE: Multidetector CT imaging of the abdomen and pelvis was performed using the standard protocol following bolus administration of intravenous contrast. RADIATION DOSE REDUCTION: This exam was performed according to the departmental dose-optimization program which includes automated exposure control, adjustment of the mA and/or kV according to patient size and/or use of iterative  reconstruction technique. CONTRAST:  75mL OMNIPAQUE IOHEXOL 350 MG/ML SOLN COMPARISON:  05/13/2022 FINDINGS: Lower chest: Small pleural effusion seen previously have resolved. No pericardial effusion. Visualized lung bases clear. Hepatobiliary: No focal liver abnormality is seen. Status post cholecystectomy. No biliary dilatation. Pancreas: Unremarkable. No pancreatic ductal dilatation or surrounding inflammatory changes. Spleen: Normal in size without focal abnormality. Adrenals/Urinary Tract: Adrenal glands are unremarkable. Kidneys are normal, without renal calculi, focal lesion, or hydronephrosis. Bladder is unremarkable. Stomach/Bowel: Stomach and small bowel nondistended, unremarkable. Normal appendix. The colon is partially distend, without acute finding. Vascular/Lymphatic: No significant vascular findings are present. No enlarged abdominal or pelvic lymph nodes. Reproductive: 2.8 cm right fundal fibroid. 3.1 cm left ovarian cyst, previously 4 cm. Other: No ascites.  No free air. Musculoskeletal: Degenerative disc disease L4-5, post posterior decompression. IMPRESSION: 1. No acute findings. 2. 3.1 cm left ovarian cyst, previously 4 cm. No follow-up imaging recommended. Note: This recommendation does not apply to premenarchal patients and to those with increased risk (genetic, family history, elevated tumor markers or other high-risk factors) of ovarian cancer. Reference: JACR 2020 Feb; 17(2):248-254 3. 2.8 cm right fundal fibroid. Electronically Signed   By: Corlis Leak M.D.   On: 12/24/2022 12:28    Micro Results    Recent Results (from the past 240 hour(s))  Urine Culture     Status: Abnormal   Collection Time: 12/24/22 11:03 AM   Specimen: Urine, Clean Catch  Result Value Ref Range Status   Specimen Description URINE, CLEAN CATCH  Final   Special Requests   Final    NONE Performed at [facility], [address], [place], [place] [address]    Culture >=100,000 COLONIES/mL ESCHERICHIA  COLI (A)  Final   Report Status 12/26/2022 FINAL  Final   Organism ID, Bacteria ESCHERICHIA COLI (A)  Final      Susceptibility   Escherichia coli - MIC*    AMPICILLIN >=32 RESISTANT Resistant     CEFAZOLIN 16 SENSITIVE Sensitive  CEFEPIME <=0.12 SENSITIVE Sensitive     CEFTRIAXONE <=0.25 SENSITIVE Sensitive     CIPROFLOXACIN <=0.25 SENSITIVE Sensitive     GENTAMICIN <=1 SENSITIVE Sensitive     IMIPENEM <=0.25 SENSITIVE Sensitive     NITROFURANTOIN <=16 SENSITIVE Sensitive     TRIMETH/SULFA <=20 SENSITIVE Sensitive     AMPICILLIN/SULBACTAM 16 INTERMEDIATE Intermediate     PIP/TAZO <=4 SENSITIVE Sensitive ug/mL    * >=100,000 COLONIES/mL ESCHERICHIA COLI  Resp panel by RT-PCR (RSV, Flu A&B, Covid) Anterior Nasal Swab     Status: None   Collection Time: 12/24/22 11:50 AM   Specimen: Anterior Nasal Swab  Result Value Ref Range Status   SARS Coronavirus 2 by RT PCR NEGATIVE NEGATIVE Final   Influenza A by PCR NEGATIVE NEGATIVE Final   Influenza B by PCR NEGATIVE NEGATIVE Final    Comment: (NOTE) The Xpert Xpress SARS-CoV-2/FLU/RSV plus assay is intended as an aid in the diagnosis of influenza from Nasopharyngeal swab specimens and should not be used as a sole basis for treatment. Nasal washings and aspirates are unacceptable for Xpert Xpress SARS-CoV-2/FLU/RSV testing.  Fact Sheet for Patients: BloggerCourse.com  Fact Sheet for Healthcare Providers: SeriousBroker.it  This test is not yet approved or cleared by the [place] FDA and has been authorized for detection and/or diagnosis of SARS-CoV-2 by FDA under an Emergency Use Authorization (EUA). This EUA will remain in effect (meaning this test can be used) for the duration of the COVID-19 declaration under Section 564(b)(1) of the Act, 21 U.S.C. section 360bbb-3(b)(1), unless the authorization is terminated or revoked.     Resp Syncytial Virus by PCR NEGATIVE NEGATIVE  Final    Comment: (NOTE) Fact Sheet for Patients: BloggerCourse.com  Fact Sheet for Healthcare Providers: SeriousBroker.it  This test is not yet approved or cleared by the [place] FDA and has been authorized for detection and/or diagnosis of SARS-CoV-2 by FDA under an Emergency Use Authorization (EUA). This EUA will remain in effect (meaning this test can be used) for the duration of the COVID-19 declaration under Section 564(b)(1) of the Act, 21 U.S.C. section 360bbb-3(b)(1), unless the authorization is terminated or revoked.  Performed at [facility], [address], [place], [place] [address]     Today   Subjective    Melinda Stone today has no headache,no chest abdominal pain,no new weakness tingling or numbness, feels much better wants to go home today.    Objective   Blood pressure 137/81, pulse 96, temperature 98.5 F (36.9 C), temperature source Oral, resp. rate 20, height 5\' 9"  (1.753 m), weight 89.4 kg, last menstrual period 11/30/2022, SpO2 100%.   Intake/Output Summary (Last 24 hours) at 12/27/2022 0819 Last data filed at 12/27/2022 0545 Gross per 24 hour  Intake 531.53 ml  Output --  Net 531.53 ml    Exam  Awake Alert, No new F.N deficits,    [place].AT,PERRAL Supple Neck,   Symmetrical Chest wall movement, Good air movement bilaterally, CTAB RRR,No Gallops,   +ve B.Sounds, Abd Soft, Non tender,  No Cyanosis, Clubbing or edema    Data Review   Recent Labs  Lab 12/21/22 2255 12/24/22 0613 12/24/22 0921 12/25/22 0522 12/26/22 0346 12/27/22 0619  WBC 11.2* 10.9*  --  5.2 5.0 5.9  HGB 12.1 12.0 11.2* 9.4* 9.0* 8.9*  HCT 34.8* 34.8* 33.0* 27.8* 26.1* 26.4*  PLT 190 186  --  165 160 148*  MCV 74.0* 75.0*  --  74.1* 75.0* 76.3*  MCH 25.7* 25.9*  --  25.1* 25.9* 25.7*  MCHC 34.8 34.5  --  33.8 34.5 33.7  RDW 21.0* 21.5*  --  21.7* 22.1* 22.5*    Recent Labs  Lab 12/24/22 0613  12/24/22 0921 12/24/22 1444 12/24/22 1648 12/24/22 1815 12/25/22 0522 12/26/22 0346 12/27/22 0619  NA 134*   < > 134* 134* 134* 135 135 137  K 4.2   < > 3.8 3.5 3.5 3.3* 3.5 3.9  CL 101   < > 104 101 105 106 110 107  CO2 16*   < > 19* 19* 20* 23 20* 20*  ANIONGAP 17*   < > 11 14 9 6 5 10   GLUCOSE 267*   < > 123* 166* 170* 157* 299* 233*  BUN 16   < > 11 12 7 8 7  5*  CREATININE 1.02*   < > 0.71 0.82 0.84 0.66 0.70 0.67  AST 13*  --   --   --   --   --   --   --   ALT 11  --   --   --   --   --   --   --   ALKPHOS 93  --   --   --   --   --   --   --   BILITOT 2.1*  --   --   --   --   --   --   --   ALBUMIN 3.8  --   --   --   --   --   --   --   MG  --   --  2.0  --   --  2.0 1.9 1.8  CALCIUM 8.8*   < > 8.2* 8.4* 8.3* 8.4* 8.1* 8.6*   < > = values in this interval not displayed.    Total Time in preparing paper work, data evaluation and todays exam - 35 minutes  Signature  -    Susa Raring M.D on 12/27/2022 at 8:19 AM   -  To page go to [email or web address]

## 2022-12-27 NOTE — Plan of Care (Signed)
Pt has rested quietly throughout the night with no distress noted. Alert and oriented. ON room air. SR on the monitor. Up to BR independently. Medicated for pain and cough with relief noted. No other complaints voiced.     Problem: Education: Goal: Ability to describe self-care measures that may prevent or decrease complications (Diabetes Survival Skills Education) will improve Outcome: Progressing Goal: Individualized Educational Video(s) Outcome: Progressing   Problem: Coping: Goal: Ability to adjust to condition or change in health will improve Outcome: Progressing   Problem: Fluid Volume: Goal: Ability to maintain a balanced intake and output will improve Outcome: Progressing   Problem: Health Behavior/Discharge Planning: Goal: Ability to identify and utilize available resources and services will improve Outcome: Progressing Goal: Ability to manage health-related needs will improve Outcome: Progressing   Problem: Metabolic: Goal: Ability to maintain appropriate glucose levels will improve Outcome: Progressing   Problem: Nutritional: Goal: Maintenance of adequate nutrition will improve Outcome: Progressing Goal: Progress toward achieving an optimal weight will improve Outcome: Progressing   Problem: Skin Integrity: Goal: Risk for impaired skin integrity will decrease Outcome: Progressing   Problem: Tissue Perfusion: Goal: Adequacy of tissue perfusion will improve Outcome: Progressing   Problem: Education: Goal: Knowledge of General Education information will improve Description: Including pain rating scale, medication(s)/side effects and non-pharmacologic comfort measures Outcome: Progressing   Problem: Health Behavior/Discharge Planning: Goal: Ability to manage health-related needs will improve Outcome: Progressing   Problem: Clinical Measurements: Goal: Ability to maintain clinical measurements within normal limits will improve Outcome: Progressing Goal:  Will remain free from infection Outcome: Progressing Goal: Diagnostic test results will improve Outcome: Progressing Goal: Respiratory complications will improve Outcome: Progressing Goal: Cardiovascular complication will be avoided Outcome: Progressing   Problem: Activity: Goal: Risk for activity intolerance will decrease Outcome: Progressing   Problem: Nutrition: Goal: Adequate nutrition will be maintained Outcome: Progressing   Problem: Coping: Goal: Level of anxiety will decrease Outcome: Progressing   Problem: Elimination: Goal: Will not experience complications related to bowel motility Outcome: Progressing Goal: Will not experience complications related to urinary retention Outcome: Progressing   Problem: Pain Managment: Goal: General experience of comfort will improve Outcome: Progressing   Problem: Safety: Goal: Ability to remain free from injury will improve Outcome: Progressing   Problem: Skin Integrity: Goal: Risk for impaired skin integrity will decrease Outcome: Progressing

## 2022-12-27 NOTE — Discharge Instructions (Signed)
Follow with Primary MD Anselm Jungling, NP in 7 days   Get CBC, CMP, 2 view Chest X ray -  checked next visit with your primary MD    Activity: As tolerated with Full fall precautions use walker/cane & assistance as needed  Disposition Home   Diet: Heart Healthy Low Carb  Accuchecks 4 times/day, Once in AM empty stomach and then before each meal. Log in all results and show them to your Prim.MD in 3 days. If any glucose reading is under 80 or above 300 call your Prim MD immidiately. Follow Low glucose instructions for glucose under 80 as instructed.   Special Instructions: If you have smoked or chewed Tobacco  in the last 2 yrs please stop smoking, stop any regular Alcohol  and or any Recreational drug use.  On your next visit with your primary care physician please Get Medicines reviewed and adjusted.  Please request your Prim.MD to go over all Hospital Tests and Procedure/Radiological results at the follow up, please get all Hospital records sent to your Prim MD by signing hospital release before you go home.  If you experience worsening of your admission symptoms, develop shortness of breath, life threatening emergency, suicidal or homicidal thoughts you must seek medical attention immediately by calling 911 or calling your MD immediately  if symptoms less severe.  You Must read complete instructions/literature along with all the possible adverse reactions/side effects for all the Medicines you take and that have been prescribed to you. Take any new Medicines after you have completely understood and accpet all the possible adverse reactions/side effects.

## 2022-12-31 ENCOUNTER — Other Ambulatory Visit (INDEPENDENT_AMBULATORY_CARE_PROVIDER_SITE_OTHER): Payer: Medicaid Other

## 2022-12-31 ENCOUNTER — Ambulatory Visit (INDEPENDENT_AMBULATORY_CARE_PROVIDER_SITE_OTHER): Payer: Medicaid Other | Admitting: Orthopedic Surgery

## 2022-12-31 DIAGNOSIS — M5416 Radiculopathy, lumbar region: Secondary | ICD-10-CM | POA: Diagnosis not present

## 2022-12-31 NOTE — Progress Notes (Signed)
Orthopedic Spine Surgery Office Note   Assessment: Patient is a 42 y.o. female with low back pain that radiates into bilateral lower extremities.  Goes down the posterior aspect of the legs     Plan: -I am not sure that she is a great surgical candidate given that she got worse after an injection and her pain is in multiple distributions in her lower extremities.  She was interested in a second opinion and had done some research.  She wanted to be referred to [facility].  A referral was provided to her today -If she returns, could discuss spinal cord stimulator as a possible treatment for her -Patient should return to office on an as-needed basis     Patient expressed understanding of the plan and all questions were answered to the patient's satisfaction.    ___________________________________________________________________________   History: Patient is a 42 y.o. female who has been previously seen in the office for symptoms low back pain radiating into bilateral lower extremities.  She feels the pain mostly on the posterior aspect of her bilateral legs in the thighs and leg.  However, she also has pain throughout the leg it is just most significant on the posterior aspect.  She is still feeling this pain on a daily basis.  She is frustrated because this pain limits her and she experiences it so regularly.  She has not developed any new symptoms since last time she was seen.   Previous treatments: PT, home exercises, over-the-counter pain medications, surgery, injections, bracing    Physical Exam:   General: no acute distress, appears stated age Neurologic: alert, answering questions appropriately, following commands Respiratory: unlabored breathing on room air, symmetric chest rise Psychiatric: appropriate affect, normal cadence to speech     MSK (spine):   -Strength exam                                                   Left                  Right EHL                               3/5                  4/5 TA                                 4/5                  4/5 GSC                             5/5                  5/5 Knee extension            5/5                  5/5 Hip flexion                    5/5                  5/5   -  Sensory exam                           Sensation intact to light touch in L3-S1 nerve distributions of bilateral lower extremities   Imaging: XRs of the lumbar spine from 12/31/2022 were independently reviewed and interpreted, showing disc height loss at L4/5 and L5/S1.  There is sclerosis of the superior endplate of L5 and inferior endplate of L4.  No evidence of instability on flexion/extension views. No fracture or dislocation seen.     Patient name: Melinda Stone Patient MRN: [id number] Date of visit: 12/31/22

## 2023-01-01 ENCOUNTER — Inpatient Hospital Stay: Payer: Medicaid Other

## 2023-01-01 VITALS — BP 134/79 | HR 96 | Temp 96.7°F | Resp 18

## 2023-01-01 DIAGNOSIS — D509 Iron deficiency anemia, unspecified: Secondary | ICD-10-CM | POA: Diagnosis present

## 2023-01-01 DIAGNOSIS — E538 Deficiency of other specified B group vitamins: Secondary | ICD-10-CM | POA: Diagnosis not present

## 2023-01-01 DIAGNOSIS — Z23 Encounter for immunization: Secondary | ICD-10-CM

## 2023-01-01 DIAGNOSIS — D519 Vitamin B12 deficiency anemia, unspecified: Secondary | ICD-10-CM

## 2023-01-01 MED ORDER — SODIUM CHLORIDE 0.9 % IV SOLN
200.0000 mg | Freq: Once | INTRAVENOUS | Status: AC
  Administered 2023-01-01: 200 mg via INTRAVENOUS
  Filled 2023-01-01: qty 200

## 2023-01-01 MED ORDER — SODIUM CHLORIDE 0.9 % IV SOLN
INTRAVENOUS | Status: DC
  Filled 2023-01-01: qty 250

## 2023-01-03 ENCOUNTER — Inpatient Hospital Stay: Payer: Medicaid Other

## 2023-01-06 ENCOUNTER — Encounter (HOSPITAL_COMMUNITY): Payer: Self-pay

## 2023-01-06 ENCOUNTER — Ambulatory Visit (HOSPITAL_COMMUNITY)
Admission: EM | Admit: 2023-01-06 | Discharge: 2023-01-06 | Disposition: A | Discharge status: Home / Self Care | Payer: Medicaid Other | Attending: Internal Medicine | Admitting: Internal Medicine

## 2023-01-06 DIAGNOSIS — J069 Acute upper respiratory infection, unspecified: Secondary | ICD-10-CM | POA: Diagnosis not present

## 2023-01-06 DIAGNOSIS — Z760 Encounter for issue of repeat prescription: Secondary | ICD-10-CM | POA: Diagnosis not present

## 2023-01-06 MED ORDER — PROMETHAZINE-DM 6.25-15 MG/5ML PO SYRP
5.0000 mL | ORAL_SOLUTION | Freq: Four times a day (QID) | ORAL | 0 refills | Status: AC | PRN
Start: 2023-01-06 — End: 2023-01-11

## 2023-01-06 MED ORDER — GABAPENTIN 800 MG PO TABS: 800.0000 mg | ORAL_TABLET | Freq: Four times a day (QID) | ORAL | 0 refills | Status: DC

## 2023-01-06 NOTE — ED Triage Notes (Signed)
Patient reports that she has a productive cough with yellow sputum and nasal congestion x 1 week.  Patient also reports that she has neuropathy pain in both legs.Patient states she ran out of of her gabapentin x 2 days and her physician is not returning to the office for another 2 days.  Patient states she has been taking Sinus medication OTC, Robitussin, and Nyquil.

## 2023-01-06 NOTE — ED Provider Notes (Signed)
[facility]    CSN: [id number] Arrival date & time: 01/06/23  1005      History   Chief Complaint Chief Complaint  Patient presents with   Nasal Congestion   Cough   Medication Refill    HPI Melinda Stone is a 42 y.o. female.   The history is provided by the patient.  Cough Medication Refill Needs a refill of her gabapentin takes 800 mg 3 times a day, states her doctor is not in the office until Tuesday.  Takes this for her diabetic neuropathy.  Also complaining of nasal congestion, rhinorrhea, cough x 1 week.  Admits occasional sputum when she coughs and some diarrhea.  Denies fever, chills, sweats, fatigue, chest pain, shortness of breath, nausea, vomiting.  Admits recent hospitalization for DKA blood sugars have been around 250.  Nuys known contacts with COVID or flu.  Denies sinus pain or pressure  Past Medical History:  Diagnosis Date   Anemia    Arthritis 08/17/2020   Chronic back pain    Diabetes mellitus without complication (HCC)    type 2   GERD (gastroesophageal reflux disease)    Migraine    Ovarian cyst    Pneumonia    Polyneuropathy     Patient Active Problem List   Diagnosis Date Noted   DKA (diabetic ketoacidosis) (HCC) 12/24/2022   DKA, type 2 (HCC) 12/24/2022   Migraine 07/11/2022   Hypokalemia 05/14/2022   Normocytic anemia 05/14/2022   Pneumonitis 05/13/2022   Grade I diastolic dysfunction 05/13/2022   Acute pharyngitis 05/13/2022   Carpal tunnel syndrome 05/13/2022   Pain of breast 05/13/2022   Contusion of upper limb 05/13/2022   Obesity 05/13/2022   Overweight (BMI 25.0-29.9) 05/13/2022   Aspirat pneumonitis due to anesth during preg, first tri 05/05/2022   Need for prophylactic vaccination and inoculation against influenza 02/07/2022   Low serum vitamin B12 01/30/2022   IDA (iron deficiency anemia) 01/30/2022   Mild intermittent asthma 01/15/2022   Esophageal reflux 01/15/2022   Dyspnea 09/01/2021   Pulmonary  nodules 09/01/2021   Absolute anemia 09/01/2021   Tobacco use 09/01/2021   Hypotension 09/01/2021   Nausea & vomiting 06/08/2021   UTI (urinary tract infection) 06/08/2021   Post laminectomy syndrome 04/11/2021   HNP (herniated nucleus pulposus), lumbar 11/14/2020   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   Chronic pain syndrome 06/14/2020   Pharmacologic therapy 06/14/2020   Disorder of skeletal system 06/14/2020   Problems influencing health status 06/14/2020   History of marijuana use 06/14/2020   History of illicit drug use 06/14/2020   Abnormal drug screen (05/25/2020) 06/14/2020   Marijuana use 06/14/2020   Abnormal MRI, lumbar spine (05/16/2020) 06/14/2020   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, L5-S1) 06/14/2020   Lumbar lateral recess stenosis (Left: L4-5, L5-S1) 06/14/2020   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) 06/14/2020   S/P lumbar microdiscectomy 06/07/2020   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   GI bleed 12/01/2019   Muscle spasm 11/06/2019   Sciatica 11/06/2019   Low back pain 02/04/2019   Type 2 diabetes mellitus with diabetic polyneuropathy, without long-term current use of insulin (HCC) 12/03/2018   Type 2 diabetes mellitus with hyperglycemia, without long-term current use of insulin (HCC) 05/15/2018   Acute bronchitis 12/01/2014    Past Surgical History:  Procedure Laterality Date   CESAREAN SECTION     CHOLECYSTECTOMY  2004   LUMBAR LAMINECTOMY N/A 05/30/2020   Procedure: Lumbar five Laminectomy,  Bilateral Microdiscectomy, Left Lumbar five -Sacral one Microdiscectomy;  Surgeon: [name], MD;  Location: [facility] OR;  Service: Orthopedics;  Laterality: N/A;   LUMBAR LAMINECTOMY Left 11/14/2020   Procedure: LEFT LUMBAR FOUR-FIVE MICRODISCECTOMY;  Surgeon: [name], MD;  Location: [facility] OR;  Service: Orthopedics;  Laterality: Left;   TUBAL LIGATION  10/19/2010    OB History     Gravida  6   Para      Term       Preterm      AB      Living  3      SAB      IAB      Ectopic      Multiple      Live Births               Home Medications    Prior to Admission medications   Medication Sig Start Date End Date Taking? Authorizing Provider  acetaminophen (TYLENOL) 500 MG tablet Take 500 mg by mouth every 6 (six) hours as needed for mild pain or moderate pain.    [provider]  amitriptyline (ELAVIL) 10 MG tablet Take 20 mg by mouth at bedtime. 12/04/22   [provider]  cyanocobalamin (VITAMIN B12) 1000 MCG tablet Take 1 tablet (1,000 mcg total) by mouth daily. 04/10/22   Rickard Patience, MD  empagliflozin (JARDIANCE) 25 MG TABS tablet Take 1 tablet (25 mg total) by mouth daily before breakfast. 10/16/22   Shamleffer, Konrad Dolores, MD  gabapentin (NEURONTIN) 800 MG tablet Take 800 mg by mouth in the morning, at noon, in the evening, and at bedtime. 04/04/21   [provider]  Galcanezumab-gnlm (EMGALITY) 120 MG/ML SOAJ Inject 120 mg into the skin every 30 (thirty) days. 07/11/22   Glean Salvo, NP  insulin aspart (NOVOLOG) 100 UNIT/ML FlexPen Before each meal 3 times a day, 140-199 - 2 units, 200-250 - 4 units, 251-299 - 8 units,  300-349 - 10 units,  350 or above 12 units. 12/27/22   Leroy Sea, MD  insulin glargine (LANTUS SOLOSTAR) 100 UNIT/ML Solostar Pen Inject 30 Units into the skin at bedtime. 12/27/22   Leroy Sea, MD  metoprolol tartrate (LOPRESSOR) 25 MG tablet Take 1 tablet (25 mg total) by mouth 2 (two) times daily. 12/27/22   Leroy Sea, MD  ondansetron (ZOFRAN-ODT) 4 MG disintegrating tablet Take 1 tablet (4 mg total) by mouth every 8 (eight) hours as needed for nausea or vomiting. 12/27/22   Leroy Sea, MD  oxyCODONE-acetaminophen (PERCOCET/ROXICET) 5-325 MG tablet Take 1 tablet by mouth every 6 (six) hours as needed for moderate pain or severe pain. 03/13/21   [provider]  pantoprazole (PROTONIX) 40 MG  tablet Take 1 tablet (40 mg total) by mouth daily. 06/12/21   Alwyn Ren, MD  Semaglutide,0.25 or 0.5MG /DOS, 2 MG/3ML SOPN Inject 0.5 mg into the skin once a week. 10/16/22   Shamleffer, Konrad Dolores, MD  SUMAtriptan (IMITREX) 50 MG tablet Take 1 tablet (50 mg total) by mouth every 2 (two) hours as needed for migraine. May repeat in 2 hours if headache persists or recurs. 07/11/22   Glean Salvo, NP  tiZANidine (ZANAFLEX) 4 MG tablet Take 4 mg by mouth in the morning and at bedtime. 04/26/22   [provider]  vitamin C (ASCORBIC ACID) 250 MG tablet Take 2 tablets (500 mg total) by mouth daily. Patient taking differently: Take 250 mg by mouth  daily. 04/10/22   Rickard Patience, MD    Family History Family History  Problem Relation Age of Onset   Diabetes Mother    Hypertension Mother    Stroke Mother     Social History Social History   Tobacco Use   Smoking status: Former    Types: Cigars    Quit date: 12/23/2021    Years since quitting: 1.0   Smokeless tobacco: Never   Tobacco comments:    Not smoking as of 12/23/21. Verified 01/15/22 LW  Vaping Use   Vaping status: Never Used  Substance Use Topics   Alcohol use: Not Currently   Drug use: Not Currently    Comment: per patient stopped smoking marijuana Mar 2022     Allergies   Trulicity [dulaglutide]   Review of Systems Review of Systems  Respiratory:  Positive for cough.      Physical Exam Triage Vital Signs ED Triage Vitals  Encounter Vitals Group     BP 01/06/23 1025 112/74     Systolic BP Percentile --      Diastolic BP Percentile --      Pulse Rate 01/06/23 1025 92     Resp 01/06/23 1025 16     Temp 01/06/23 1025 98.1 F (36.7 C)     Temp Source 01/06/23 1025 Oral     SpO2 01/06/23 1025 99 %     Weight --      Height --      Head Circumference --      Peak Flow --      Pain Score 01/06/23 1027 9     Pain Loc --      Pain Education --      Exclude from Growth Chart --    No data  found.  Updated Vital Signs BP 112/74 (BP Location: Left Arm)   Pulse 92   Temp 98.1 F (36.7 C) (Oral)   Resp 16   LMP 12/30/2022 Comment: "on it now"  SpO2 99%   Visual Acuity Right Eye Distance:   Left Eye Distance:   Bilateral Distance:    Right Eye Near:   Left Eye Near:    Bilateral Near:     Physical Exam Vitals and nursing note reviewed.  Constitutional:      Appearance: She is not ill-appearing.  HENT:     Head: Normocephalic.     Right Ear: Tympanic membrane normal.     Left Ear: Tympanic membrane normal.     Nose: No congestion or rhinorrhea.     Mouth/Throat:     Mouth: Mucous membranes are moist.  Eyes:     Conjunctiva/sclera: Conjunctivae normal.  Cardiovascular:     Rate and Rhythm: Normal rate and regular rhythm.     Heart sounds: Normal heart sounds.  Pulmonary:     Effort: No respiratory distress.     Breath sounds: Normal breath sounds. No wheezing, rhonchi or rales.  Musculoskeletal:     Cervical back: Neck supple.  Lymphadenopathy:     Cervical: No cervical adenopathy.  Skin:    General: Skin is warm and dry.  Neurological:     Mental Status: She is alert and oriented to person, place, and time.      UC Treatments / Results  Labs (all labs ordered are listed, but only abnormal results are displayed) Labs Reviewed - No data to display  EKG   Radiology No results found.  Procedures Procedures (including critical care time)  Medications  Ordered in UC Medications - No data to display  Initial Impression / Assessment and Plan / UC Course  I have reviewed the triage vital signs and the nursing notes.  Pertinent labs & imaging results that were available during my care of the patient were reviewed by me and considered in my medical decision making (see chart for details).    42 year old female with URI symptoms x 1 week, no fever chest pain or shortness of breath.  Taking over-the-counter Zyrtec and Benadryl without relief.  Also  needs refill of her gabapentin.  Vital signs are stable, she is well-appearing, afebrile.  Lungs are clear to auscultation.  Final Clinical Impressions(s) / UC Diagnoses   Final diagnoses:  None   Discharge Instructions   None    ED Prescriptions   None    PDMP not reviewed this encounter.   Meliton Rattan, [place] 01/06/23 1044

## 2023-01-08 ENCOUNTER — Inpatient Hospital Stay: Payer: Medicaid Other

## 2023-01-08 VITALS — BP 127/72 | HR 98 | Temp 96.0°F | Resp 18

## 2023-01-08 DIAGNOSIS — Z23 Encounter for immunization: Secondary | ICD-10-CM

## 2023-01-08 DIAGNOSIS — D509 Iron deficiency anemia, unspecified: Secondary | ICD-10-CM | POA: Diagnosis not present

## 2023-01-08 DIAGNOSIS — D519 Vitamin B12 deficiency anemia, unspecified: Secondary | ICD-10-CM

## 2023-01-08 MED ORDER — IRON SUCROSE 20 MG/ML IV SOLN
200.0000 mg | Freq: Once | INTRAVENOUS | Status: AC
  Administered 2023-01-08: 200 mg via INTRAVENOUS
  Filled 2023-01-08: qty 10

## 2023-01-08 MED ORDER — SODIUM CHLORIDE 0.9% FLUSH
10.0000 mL | Freq: Once | INTRAVENOUS | Status: AC | PRN
  Administered 2023-01-08: 10 mL
  Filled 2023-01-08: qty 10

## 2023-01-08 MED ORDER — SODIUM CHLORIDE 0.9% FLUSH
3.0000 mL | Freq: Once | INTRAVENOUS | Status: DC | PRN
Start: 2023-01-08 — End: 2023-01-08
  Filled 2023-01-08: qty 3

## 2023-01-08 NOTE — Progress Notes (Signed)
Patient tolerated iron push over 5 minutes well. Patient refused 30 minute observation. Discharged home in stable condition. Patient made aware of symptoms to look out for that would require her to go to ED.

## 2023-01-09 ENCOUNTER — Other Ambulatory Visit: Payer: Self-pay

## 2023-01-09 ENCOUNTER — Encounter (HOSPITAL_COMMUNITY): Payer: Self-pay | Admitting: *Deleted

## 2023-01-09 ENCOUNTER — Emergency Department (HOSPITAL_COMMUNITY)
Admission: EM | Admit: 2023-01-09 | Discharge: 2023-01-10 | Disposition: A | Discharge status: Home / Self Care | Payer: Medicaid Other | Attending: Emergency Medicine | Admitting: Emergency Medicine

## 2023-01-09 DIAGNOSIS — Z794 Long term (current) use of insulin: Secondary | ICD-10-CM | POA: Insufficient documentation

## 2023-01-09 DIAGNOSIS — G8929 Other chronic pain: Secondary | ICD-10-CM | POA: Diagnosis not present

## 2023-01-09 DIAGNOSIS — M5442 Lumbago with sciatica, left side: Secondary | ICD-10-CM | POA: Diagnosis not present

## 2023-01-09 DIAGNOSIS — M545 Low back pain, unspecified: Secondary | ICD-10-CM | POA: Diagnosis present

## 2023-01-09 NOTE — ED Triage Notes (Signed)
The pt has chronic sciatic pain lt  leg  she has had this episode for the past 3 days

## 2023-01-10 MED ORDER — LIDOCAINE 5 % EX PTCH
1.0000 | MEDICATED_PATCH | CUTANEOUS | Status: DC
  Administered 2023-01-10: 1 via TRANSDERMAL
  Filled 2023-01-10: qty 1

## 2023-01-10 MED ORDER — PREDNISONE 5 MG PO TABS
50.0000 mg | ORAL_TABLET | Freq: Once | ORAL | Status: AC
  Administered 2023-01-10: 50 mg via ORAL
  Filled 2023-01-10: qty 2

## 2023-01-10 MED ORDER — PREDNISONE 50 MG PO TABS
50.0000 mg | ORAL_TABLET | Freq: Every day | ORAL | 0 refills | Status: DC
Start: 2023-01-10 — End: 2023-01-12

## 2023-01-10 MED ORDER — KETOROLAC TROMETHAMINE 15 MG/ML IJ SOLN
30.0000 mg | Freq: Once | INTRAMUSCULAR | Status: AC
  Administered 2023-01-10: 30 mg via INTRAMUSCULAR
  Filled 2023-01-10: qty 2

## 2023-01-10 NOTE — ED Notes (Signed)
Patient verbalizes understanding of discharge instructions. Opportunity for questioning and answers were provided. Armband removed by staff, pt discharged from ED. Pt taken to ED waiting room via wheel chair.  

## 2023-01-10 NOTE — ED Provider Notes (Signed)
[place] [facility] AT [facility] Provider Note   CSN: [id number] Arrival date & time: 01/09/23  2100     History  Chief Complaint  Patient presents with   sciatic  pain    Melinda Stone is a 42 y.o. female who presents with chronic sciatic pain rating down the left leg.  Melinda saddle Stone, Melinda Stone, Melinda Stone.  Follows with pain management, is been taking Percocet at home with minimal improvement.  Using lidocaine patches.  States that her symptoms significantly proved after prednisone.  Has not present for several months.  I reviewed her medical records.  Has diabetes in addition to chronic low back pain.  HPI     Home Medications Prior to Admission medications   Medication Sig Start Date End Date Taking? Authorizing Provider  predniSONE (DELTASONE) 50 MG tablet Take 1 tablet (50 mg total) by mouth daily with breakfast for 4 days. 01/10/23 01/14/23 Yes Iam Lipson, Eugene Gavia, PA-C  acetaminophen (TYLENOL) 500 MG tablet Take 500 mg by mouth every 6 (six) hours as needed for mild pain or moderate pain.    [provider]  amitriptyline (ELAVIL) 10 MG tablet Take 20 mg by mouth at bedtime. 12/04/22   [provider]  cyanocobalamin (VITAMIN B12) 1000 MCG tablet Take 1 tablet (1,000 mcg total) by mouth daily. 04/10/22   Rickard Patience, MD  empagliflozin (JARDIANCE) 25 MG TABS tablet Take 1 tablet (25 mg total) by mouth daily before breakfast. 10/16/22   Shamleffer, Konrad Dolores, MD  gabapentin (NEURONTIN) 800 MG tablet Take 1 tablet (800 mg total) by mouth in the morning, at noon, in the evening, and at bedtime for 10 days. 01/06/23 01/16/23  Mel[place] Rattan, PA  Galcanezumab-gnlm (EMGALITY) 120 MG/ML SOAJ Inject 120 mg into the skin every 30 (thirty) days. 07/11/22   Glean Salvo, NP  insulin aspart (NOVOLOG) 100 UNIT/ML FlexPen Before each meal 3 times a day, 140-199 - 2 units, 200-250 - 4  units, 251-299 - 8 units,  300-349 - 10 units,  350 or above 12 units. 12/27/22   Leroy Sea, MD  insulin glargine (LANTUS SOLOSTAR) 100 UNIT/ML Solostar Pen Inject 30 Units into the skin at bedtime. 12/27/22   Leroy Sea, MD  metoprolol tartrate (LOPRESSOR) 25 MG tablet Take 1 tablet (25 mg total) by mouth 2 (two) times daily. 12/27/22   Leroy Sea, MD  ondansetron (ZOFRAN-ODT) 4 MG disintegrating tablet Take 1 tablet (4 mg total) by mouth every 8 (eight) hours as needed for nausea or vomiting. 12/27/22   Leroy Sea, MD  oxyCODONE-acetaminophen (PERCOCET/ROXICET) 5-325 MG tablet Take 1 tablet by mouth every 6 (six) hours as needed for moderate pain or severe pain. 03/13/21   [provider]  pantoprazole (PROTONIX) 40 MG tablet Take 1 tablet (40 mg total) by mouth daily. 06/12/21   Alwyn Ren, MD  promethazine-dextromethorphan (PROMETHAZINE-DM) 6.25-15 MG/5ML syrup Take 5 mLs by mouth 4 (four) times daily as needed for up to 5 days for cough. 01/06/23 01/11/23  Mel[place] Rattan, PA  Semaglutide,0.25 or 0.5MG /DOS, 2 MG/3ML SOPN Inject 0.5 mg into the skin once a week. 10/16/22   Shamleffer, Konrad Dolores, MD  SUMAtriptan (IMITREX) 50 MG tablet Take 1 tablet (50 mg total) by mouth every 2 (two) hours as needed for migraine. May repeat in 2 hours if headache persists or recurs. 07/11/22   Glean Salvo, NP  tiZANidine (ZANAFLEX) 4 MG  tablet Take 4 mg by mouth in the morning and at bedtime. 04/26/22   [provider]  vitamin C (ASCORBIC ACID) 250 MG tablet Take 2 tablets (500 mg total) by mouth daily. Patient taking differently: Take 250 mg by mouth daily. 04/10/22   Rickard Patience, MD      Allergies    Trulicity [dulaglutide]    Review of Systems   Review of Systems  Musculoskeletal:  Positive for back pain.    Physical Exam Updated Vital Signs BP 103/65 (BP Location: Left Arm)   Pulse 89   Temp 97.9 F (36.6 C) (Oral)   Resp 18   Ht 5\' 9"   (1.753 m)   Wt 89.4 kg   LMP 12/30/2022 Comment: "on it now"  SpO2 99%   BMI 29.11 kg/m  Physical Exam Vitals and nursing note reviewed.  Constitutional:      Appearance: She is obese. She is not ill-appearing or toxic-appearing.  HENT:     Head: Normocephalic and atraumatic.  Eyes:     General: Melinda scleral icterus.       Right eye: Melinda discharge.        Left eye: Melinda discharge.     Conjunctiva/sclera: Conjunctivae normal.  Pulmonary:     Effort: Pulmonary effort is normal.  Skin:    General: Skin is warm and dry.     Capillary Refill: Capillary refill takes less than 2 seconds.  Neurological:     General: [place] focal deficit present.     Mental Status: She is alert and oriented to person, place, and time.     GCS: GCS eye subscore is 4. GCS verbal subscore is 5. GCS motor subscore is 6.     Cranial Nerves: Cranial nerves 2-12 are intact.     Sensory: Sensation is intact.     Motor: Motor function is intact.     Gait: Gait is intact.  Psychiatric:        Mood and Affect: Mood normal.     ED Results / Procedures / Treatments   Labs (all labs ordered are listed, but only abnormal results are displayed) Labs Reviewed - [place] data to display  EKG None  Radiology [place] results found.  Procedures Procedures    Medications Ordered in ED Medications  lidocaine (LIDODERM) 5 % 1 patch (1 patch Transdermal Patch Applied 01/10/23 0211)  ketorolac (TORADOL) 15 MG/ML injection 30 mg (30 mg Intramuscular Given 01/10/23 0212)  predniSONE (DELTASONE) tablet 50 mg (50 mg Oral Given 01/10/23 0210)    ED Course/ Medical Decision Making/ A&P                                 Medical Decision Making 42 year old with chronic sciatica  Reassuring vitals on intake.  Neurologic exam is normal.  Ambulatory  Risk Prescription drug management.   Patient without clinical presentation concerning for cauda equina syndrome.  Presentation consistent with patient's known lumbar radiculopathy.   Will trial steroid.  Toradol offered in the ED.  Clinical concern for emergent underlying etiology of this patient symptoms that would warrant further ED workup and patient management is exceedingly low  Mel[place] Stone  voiced understanding of her medical evaluation and treatment plan. Each of their questions answered to their expressed satisfaction.  Return precautions were given.  Patient is well-appearing, stable, and was discharged in good condition.  This chart was dictated using voice recognition software, Dragon. Despite the  best efforts of this provider to proofread and correct errors, errors may still occur which can change documentation meaning.        Final Clinical Impression(s) / ED Diagnoses Final diagnoses:  Chronic left-sided low back pain with left-sided sciatica    Rx / DC Orders ED Discharge Orders          Ordered    predniSONE (DELTASONE) 50 MG tablet  Daily with breakfast        01/10/23 0159              [place] Cloe, Eugene Gavia, PA-C 01/10/23 8657    Gilda Crease, MD 01/11/23 [phone]

## 2023-01-10 NOTE — Discharge Instructions (Signed)
You were seen in the emergency department today for your low back pain.  Your physical exam and vital signs are very reassuring.  The muscles in your low back are in what is called spasm, meaning they are inappropriately tightened up.  You are likely also experiencing sciatica. This can be quite painful.  To help with your pain you may take Tylenol and / or NSAID medication (such as ibuprofen or naproxen) to help with your pain.    You may also utilize topical pain relief such as Biofreeze, IcyHot, or topical lidocaine patches.  I also recommend that you apply heat to the area, such as a hot shower or heating pad, and follow heat application with massage of the muscles that are most tight.  Please return to the emergency department if you develop any numbness/tingling/weakness in your arms or legs, any difficulty urinating, or urinary incontinence chest pain, shortness of breath, abdominal pain, nausea or vomiting that does not stop, or any other new severe symptoms.

## 2023-01-11 ENCOUNTER — Other Ambulatory Visit: Payer: Self-pay

## 2023-01-11 ENCOUNTER — Emergency Department (HOSPITAL_COMMUNITY)
Admission: EM | Admit: 2023-01-11 | Discharge: 2023-01-12 | Discharge status: Against Medical Advice | Payer: Medicaid Other | Attending: Emergency Medicine | Admitting: Emergency Medicine

## 2023-01-11 DIAGNOSIS — R739 Hyperglycemia, unspecified: Secondary | ICD-10-CM | POA: Diagnosis not present

## 2023-01-11 DIAGNOSIS — Z5321 Procedure and treatment not carried out due to patient leaving prior to being seen by health care provider: Secondary | ICD-10-CM | POA: Diagnosis not present

## 2023-01-11 DIAGNOSIS — Z794 Long term (current) use of insulin: Secondary | ICD-10-CM | POA: Diagnosis not present

## 2023-01-11 DIAGNOSIS — M5442 Lumbago with sciatica, left side: Secondary | ICD-10-CM | POA: Diagnosis present

## 2023-01-11 NOTE — ED Triage Notes (Signed)
Patient reports " sciatic pain " at left lower back radiating down to left buttocks and left leg onset last week , currently taking oral Rx .steroids/pain medication.

## 2023-01-12 ENCOUNTER — Emergency Department (HOSPITAL_COMMUNITY)
Admission: EM | Admit: 2023-01-12 | Discharge: 2023-01-12 | Disposition: A | Discharge status: Home / Self Care | Payer: Medicaid Other | Attending: Emergency Medicine | Admitting: Emergency Medicine

## 2023-01-12 ENCOUNTER — Other Ambulatory Visit: Payer: Self-pay

## 2023-01-12 ENCOUNTER — Encounter (HOSPITAL_COMMUNITY): Payer: Self-pay | Admitting: Emergency Medicine

## 2023-01-12 DIAGNOSIS — M5432 Sciatica, left side: Secondary | ICD-10-CM

## 2023-01-12 DIAGNOSIS — M5442 Lumbago with sciatica, left side: Secondary | ICD-10-CM | POA: Insufficient documentation

## 2023-01-12 DIAGNOSIS — Z794 Long term (current) use of insulin: Secondary | ICD-10-CM | POA: Insufficient documentation

## 2023-01-12 DIAGNOSIS — R739 Hyperglycemia, unspecified: Secondary | ICD-10-CM | POA: Insufficient documentation

## 2023-01-12 LAB — CBC WITH DIFFERENTIAL/PLATELET
Abs Immature Granulocytes: 0.09 10*3/uL — ABNORMAL HIGH (ref 0.00–0.07)
Basophils Absolute: 0.1 10*3/uL (ref 0.0–0.1)
Basophils Relative: 1 %
Eosinophils Absolute: 0 10*3/uL (ref 0.0–0.5)
Eosinophils Relative: 0 %
HCT: 33.4 % — ABNORMAL LOW (ref 36.0–46.0)
Hemoglobin: 11.3 g/dL — ABNORMAL LOW (ref 12.0–15.0)
Immature Granulocytes: 1 %
Lymphocytes Relative: 5 %
Lymphs Abs: 0.7 10*3/uL (ref 0.7–4.0)
MCH: 27.2 pg (ref 26.0–34.0)
MCHC: 33.8 g/dL (ref 30.0–36.0)
MCV: 80.3 fL (ref 80.0–100.0)
Monocytes Absolute: 0.1 10*3/uL (ref 0.1–1.0)
Monocytes Relative: 1 %
Neutro Abs: 11.4 10*3/uL — ABNORMAL HIGH (ref 1.7–7.7)
Neutrophils Relative %: 92 %
Platelets: 253 10*3/uL (ref 150–400)
RBC: 4.16 MIL/uL (ref 3.87–5.11)
RDW: 23.1 % — ABNORMAL HIGH (ref 11.5–15.5)
WBC: 12.2 10*3/uL — ABNORMAL HIGH (ref 4.0–10.5)
nRBC: 0 % (ref 0.0–0.2)

## 2023-01-12 LAB — COMPREHENSIVE METABOLIC PANEL
ALT: 12 U/L (ref 0–44)
AST: 11 U/L — ABNORMAL LOW (ref 15–41)
Albumin: 4.2 g/dL (ref 3.5–5.0)
Alkaline Phosphatase: 91 U/L (ref 38–126)
Anion gap: 9 (ref 5–15)
BUN: 20 mg/dL (ref 6–20)
CO2: 20 mmol/L — ABNORMAL LOW (ref 22–32)
Calcium: 8.6 mg/dL — ABNORMAL LOW (ref 8.9–10.3)
Chloride: 100 mmol/L (ref 98–111)
Creatinine, Ser: 0.85 mg/dL (ref 0.44–1.00)
GFR, Estimated: >60 mL/min (ref >60)
Glucose, Bld: 585 mg/dL (ref 70–99)
Potassium: 5 mmol/L (ref 3.5–5.1)
Sodium: 129 mmol/L — ABNORMAL LOW (ref 135–145)
Total Bilirubin: 1.1 mg/dL (ref 0.3–1.2)
Total Protein: 7.7 g/dL (ref 6.5–8.1)

## 2023-01-12 LAB — CBG MONITORING, ED
Glucose-Capillary: 521 mg/dL (ref 70–99)
Glucose-Capillary: 540 mg/dL (ref 70–99)
Glucose-Capillary: 535 mg/dL (ref 70–99)
Glucose-Capillary: 398 mg/dL — ABNORMAL HIGH (ref 70–99)

## 2023-01-12 LAB — BLOOD GAS, VENOUS
Acid-base deficit: 2 mmol/L (ref 0.0–2.0)
Bicarbonate: 23.7 mmol/L (ref 20.0–28.0)
O2 Saturation: 58.7 %
Patient temperature: 37
pCO2, Ven: 43 mm[Hg] — ABNORMAL LOW (ref 44–60)
pH, Ven: 7.35 (ref 7.25–7.43)
pO2, Ven: 32 mm[Hg] (ref 32–45)

## 2023-01-12 MED ORDER — ACETAMINOPHEN 325 MG PO TABS
650.0000 mg | ORAL_TABLET | Freq: Once | ORAL | Status: AC | PRN
  Administered 2023-01-12: 650 mg via ORAL
  Filled 2023-01-12: qty 2

## 2023-01-12 MED ORDER — KETOROLAC TROMETHAMINE 15 MG/ML IJ SOLN
15.0000 mg | Freq: Once | INTRAMUSCULAR | Status: AC
  Administered 2023-01-12: 15 mg via INTRAMUSCULAR
  Filled 2023-01-12: qty 1

## 2023-01-12 MED ORDER — METHOCARBAMOL 500 MG PO TABS
500.0000 mg | ORAL_TABLET | Freq: Once | ORAL | Status: AC
  Administered 2023-01-12: 500 mg via ORAL
  Filled 2023-01-12: qty 1

## 2023-01-12 MED ORDER — CELECOXIB 100 MG PO CAPS: 100.0000 mg | ORAL_CAPSULE | Freq: Two times a day (BID) | ORAL | 0 refills | Status: AC

## 2023-01-12 MED ORDER — INSULIN GLARGINE-YFGN 100 UNIT/ML ~~LOC~~ SOLN
30.0000 [IU] | Freq: Once | SUBCUTANEOUS | Status: AC
  Administered 2023-01-12: 30 [IU] via SUBCUTANEOUS
  Filled 2023-01-12: qty 0.3

## 2023-01-12 MED ORDER — METHOCARBAMOL 500 MG PO TABS: 500.0000 mg | ORAL_TABLET | Freq: Two times a day (BID) | ORAL | 0 refills | Status: DC

## 2023-01-12 MED ORDER — OXYCODONE-ACETAMINOPHEN 5-325 MG PO TABS
1.0000 | ORAL_TABLET | Freq: Once | ORAL | Status: AC
  Administered 2023-01-12: 1 via ORAL
  Filled 2023-01-12: qty 1

## 2023-01-12 MED ORDER — SODIUM CHLORIDE 0.9 % IV BOLUS
1000.0000 mL | Freq: Once | INTRAVENOUS | Status: AC
  Administered 2023-01-12: 1000 mL via INTRAVENOUS

## 2023-01-12 MED ORDER — GABAPENTIN 400 MG PO CAPS
800.0000 mg | ORAL_CAPSULE | Freq: Once | ORAL | Status: AC
  Administered 2023-01-12: 800 mg via ORAL
  Filled 2023-01-12: qty 2

## 2023-01-12 MED ORDER — OXYCODONE HCL 5 MG PO TABS
5.0000 mg | ORAL_TABLET | Freq: Once | ORAL | Status: AC
  Administered 2023-01-12: 5 mg via ORAL
  Filled 2023-01-12: qty 1

## 2023-01-12 MED ORDER — DIAZEPAM 2 MG PO TABS
2.0000 mg | ORAL_TABLET | Freq: Once | ORAL | Status: AC
  Administered 2023-01-12: 2 mg via ORAL
  Filled 2023-01-12: qty 1

## 2023-01-12 NOTE — ED Triage Notes (Signed)
Patient c/o left sided back pain ongoing for 4 days. Hx of sciatica - and states it feels the same. Tried home remedies and medication without relief.

## 2023-01-12 NOTE — Discharge Instructions (Addendum)
Discontinue your prednisone. Discontinue the Tizanadine. Start Robaxin and Celebrex.  Follow up with your primary care provider for recheck and possible referral to physical therapy.

## 2023-01-12 NOTE — ED Provider Notes (Signed)
Signout from [employer] at shift change. Briefly, patient presents for chronic back pain. Noted to be hyperglycemic. No DKA.    Plan: Treatment of hyperglycemia with insulin and IV fluids    10:16 AM Reassessment performed. Patient appears stable. She still c/o pain despite treatment in the ED.  She looks fairly comfortable, lying off the side of the bed.  She has been up to the restroom several times per RN.  Labs and imaging personally reviewed and interpreted including: CBC with elevated white blood cell count 12.2, hemoglobin 11.3 slightly low; CMP sodium 129, proximal to normal; glucose 585 with normal creatinine, bicarb 20, normal anion gap blood gas with pH of 7.35 and normal bicarb; blood sugar 540 >> 521 >> 535 >> 398.    Reviewed additional pertinent lab work and imaging with patient at bedside including: Improving blood sugar   Most current vital signs reviewed and are as follows: BP (!) 144/79 (BP Location: Left Arm)   Pulse 93   Temp 97.8 F (36.6 C) (Oral)   Resp 18   Ht 5\' 9"  (1.753 m)   Wt 89.4 kg   LMP 12/30/2022 Comment: "on it now"  SpO2 100%   BMI 29.09 kg/m   Plan: Discharge home   Home treatment: Current regimen includes oxycodone, gabapentin.  Previous provider has sent in prescription for Celebrex and methocarbamol.   Return and follow-up instructions: Encouraged return to ED with new or worsening symptoms. Encouraged patient to follow-up with their provider and neurosurgeon. Patient verbalized understanding and agreed with plan.        Renne Crigler, PA-C 01/12/23 1020    Wynetta Fines, MD 01/12/23 [phone]

## 2023-01-12 NOTE — ED Provider Notes (Signed)
[place] EMERGENCY DEPARTMENT AT [facility] Provider Note   CSN: [id number] Arrival date & time: 01/12/23  0132     History  Chief Complaint  Patient presents with   Back Pain    Mel[place] Stone is a 42 y.o. female.  42 year old female presents with complaint of left lower back pain/scaitica x 4 days. Seen here for same recently and provided with prednisone that is not helping. Denies fevers, chills, falls, loss of bowel or bladder habits, saddle paresthesia, abdominal pain. Pain        Home Medications Prior to Admission medications   Medication Sig Start Date End Date Taking? Authorizing Provider  celecoxib (CELEBREX) 100 MG capsule Take 1 capsule (100 mg total) by mouth 2 (two) times daily for 10 days. 01/12/23 01/22/23 Yes Jeannie Fend, PA-C  methocarbamol (ROBAXIN) 500 MG tablet Take 1 tablet (500 mg total) by mouth 2 (two) times daily. 01/12/23  Yes Jeannie Fend, PA-C  acetaminophen (TYLENOL) 500 MG tablet Take 500 mg by mouth every 6 (six) hours as needed for mild pain or moderate pain.    [provider]  amitriptyline (ELAVIL) 10 MG tablet Take 20 mg by mouth at bedtime. 12/04/22   [provider]  cyanocobalamin (VITAMIN B12) 1000 MCG tablet Take 1 tablet (1,000 mcg total) by mouth daily. 04/10/22   Rickard Patience, MD  empagliflozin (JARDIANCE) 25 MG TABS tablet Take 1 tablet (25 mg total) by mouth daily before breakfast. 10/16/22   Shamleffer, Konrad Dolores, MD  gabapentin (NEURONTIN) 800 MG tablet Take 1 tablet (800 mg total) by mouth in the morning, at noon, in the evening, and at bedtime for 10 days. 01/06/23 01/16/23  Mel[place] Rattan, PA  Galcanezumab-gnlm (EMGALITY) 120 MG/ML SOAJ Inject 120 mg into the skin every 30 (thirty) days. 07/11/22   Glean Salvo, NP  insulin aspart (NOVOLOG) 100 UNIT/ML FlexPen Before each meal 3 times a day, 140-199 - 2 units, 200-250 - 4 units, 251-299 - 8 units,  300-349 - 10 units,  350 or above 12  units. 12/27/22   Leroy Sea, MD  insulin glargine (LANTUS SOLOSTAR) 100 UNIT/ML Solostar Pen Inject 30 Units into the skin at bedtime. 12/27/22   Leroy Sea, MD  metoprolol tartrate (LOPRESSOR) 25 MG tablet Take 1 tablet (25 mg total) by mouth 2 (two) times daily. 12/27/22   Leroy Sea, MD  ondansetron (ZOFRAN-ODT) 4 MG disintegrating tablet Take 1 tablet (4 mg total) by mouth every 8 (eight) hours as needed for nausea or vomiting. 12/27/22   Leroy Sea, MD  oxyCODONE-acetaminophen (PERCOCET/ROXICET) 5-325 MG tablet Take 1 tablet by mouth every 6 (six) hours as needed for moderate pain or severe pain. 03/13/21   [provider]  pantoprazole (PROTONIX) 40 MG tablet Take 1 tablet (40 mg total) by mouth daily. 06/12/21   Alwyn Ren, MD  Semaglutide,0.25 or 0.5MG /DOS, 2 MG/3ML SOPN Inject 0.5 mg into the skin once a week. 10/16/22   Shamleffer, Konrad Dolores, MD  SUMAtriptan (IMITREX) 50 MG tablet Take 1 tablet (50 mg total) by mouth every 2 (two) hours as needed for migraine. May repeat in 2 hours if headache persists or recurs. 07/11/22   Glean Salvo, NP  vitamin C (ASCORBIC ACID) 250 MG tablet Take 2 tablets (500 mg total) by mouth daily. Patient taking differently: Take 250 mg by mouth daily. 04/10/22   Rickard Patience, MD      Allergies  Trulicity [dulaglutide]    Review of Systems   Review of Systems Negative except as per HPI Physical Exam Updated Vital Signs BP (!) 152/97 (BP Location: Right Arm)   Pulse 93   Temp 98.1 F (36.7 C) (Oral)   Resp 18   Ht 5\' 9"  (1.753 m)   Wt 89.4 kg   LMP 12/30/2022 Comment: "on it now"  SpO2 98%   BMI 29.09 kg/m  Physical Exam Vitals and nursing note reviewed.  Constitutional:      General: She is not in acute distress.    Appearance: She is well-developed. She is not diaphoretic.  HENT:     Head: Normocephalic and atraumatic.  Cardiovascular:     Pulses: Normal pulses.  Pulmonary:     Effort:  Pulmonary effort is normal.  Abdominal:     Palpations: Abdomen is soft.     Tenderness: There is no abdominal tenderness.  Musculoskeletal:        General: Tenderness present.     Lumbar back: Tenderness present. No bony tenderness. Positive left straight leg raise test. Negative right straight leg raise test.       Back:  Skin:    General: Skin is warm and dry.     Findings: No erythema or rash.  Neurological:     Mental Status: She is alert and oriented to person, place, and time.     Sensory: No sensory deficit.     Motor: No weakness.     Deep Tendon Reflexes:     Reflex Scores:      Patellar reflexes are 1+ on the right side and 1+ on the left side. Psychiatric:        Behavior: Behavior normal.     ED Results / Procedures / Treatments   Labs (all labs ordered are listed, but only abnormal results are displayed) Labs Reviewed  CBC WITH DIFFERENTIAL/PLATELET - Abnormal; Notable for the following components:      Result Value   WBC 12.2 (*)    Hemoglobin 11.3 (*)    HCT 33.4 (*)    RDW 23.1 (*)    Neutro Abs 11.4 (*)    Abs Immature Granulocytes 0.09 (*)    All other components within normal limits  COMPREHENSIVE METABOLIC PANEL - Abnormal; Notable for the following components:   Sodium 129 (*)    CO2 20 (*)    Glucose, Bld 585 (*)    Calcium 8.6 (*)    AST 11 (*)    All other components within normal limits  BLOOD GAS, VENOUS - Abnormal; Notable for the following components:   pCO2, Ven 43 (*)    All other components within normal limits  CBG MONITORING, ED - Abnormal; Notable for the following components:   Glucose-Capillary 540 (*)    All other components within normal limits  CBG MONITORING, ED - Abnormal; Notable for the following components:   Glucose-Capillary 521 (*)    All other components within normal limits    EKG None  Radiology No results found.  Procedures Procedures    Medications Ordered in ED Medications  acetaminophen (TYLENOL)  tablet 650 mg (650 mg Oral Given 01/12/23 0247)  ketorolac (TORADOL) 15 MG/ML injection 15 mg (15 mg Intramuscular Given 01/12/23 0457)  diazepam (VALIUM) tablet 2 mg (2 mg Oral Given 01/12/23 0457)  sodium chloride 0.9 % bolus 1,000 mL (1,000 mLs Intravenous New Bag/Given 01/12/23 0519)  insulin glargine-yfgn (SEMGLEE) injection 30 Units (30 Units Subcutaneous Given 01/12/23  4401)  oxyCODONE (Oxy IR/ROXICODONE) immediate release tablet 5 mg (5 mg Oral Given 01/12/23 0272)    ED Course/ Medical Decision Making/ A&P                                 Medical Decision Making Risk OTC drugs.   42 year old female with left lower back pain, radiates down left leg, worse with movement. Not improving with PO prednisone provided at ER visit 01/10/23. No red flags. Glucose is elevated likely secondary to prednisone and missing her insulin last night. Provided with valium, toradol, tylenol without relief. Added oxy with improvement.  Provided with IVF and insulin. Not in DKA. Care signed out at change of shift pending improvement in CBG with rx for robaxin and celebrex.        Final Clinical Impression(s) / ED Diagnoses Final diagnoses:  Sciatica of left side  Hyperglycemia    Rx / DC Orders ED Discharge Orders          Ordered    methocarbamol (ROBAXIN) 500 MG tablet  2 times daily        01/12/23 0658    celecoxib (CELEBREX) 100 MG capsule  2 times daily        01/12/23 0658              Jeannie Fend, PA-C 01/12/23 0701    Tilden Fossa, MD 01/12/23 [phone]

## 2023-01-15 ENCOUNTER — Telehealth: Payer: Self-pay | Admitting: Physical Medicine and Rehabilitation

## 2023-01-15 ENCOUNTER — Encounter: Payer: Self-pay | Admitting: Physical Medicine and Rehabilitation

## 2023-01-15 ENCOUNTER — Ambulatory Visit (INDEPENDENT_AMBULATORY_CARE_PROVIDER_SITE_OTHER): Payer: Medicaid Other | Admitting: Physical Medicine and Rehabilitation

## 2023-01-15 DIAGNOSIS — M5116 Intervertebral disc disorders with radiculopathy, lumbar region: Secondary | ICD-10-CM

## 2023-01-15 DIAGNOSIS — M961 Postlaminectomy syndrome, not elsewhere classified: Secondary | ICD-10-CM | POA: Diagnosis not present

## 2023-01-15 DIAGNOSIS — G894 Chronic pain syndrome: Secondary | ICD-10-CM

## 2023-01-15 DIAGNOSIS — M5416 Radiculopathy, lumbar region: Secondary | ICD-10-CM

## 2023-01-15 NOTE — Telephone Encounter (Signed)
Received vm from patient requesting her medical records to be sent to [facility] [place]. IC advised patient we need signed authorization before records can be released.

## 2023-01-15 NOTE — Progress Notes (Unsigned)
Functional Pain Scale - descriptive words and definitions  Intense (8)    Cannot complete any ADLs without much assistance/cannot concentrate/conversation is difficult/unable to sleep and unable to use distraction. Severe range order  Average Pain 10  Lower back pain on left side that radiates into the left leg

## 2023-01-15 NOTE — Progress Notes (Unsigned)
Melinda Stone - 42 y.o. female MRN [id number]  Date of birth: 1980/10/09  Office Visit Note: Visit Date: 01/15/2023 PCP: Anselm Jungling, NP Referred by: Anselm Jungling, NP  Subjective: Chief Complaint  Patient presents with  . Lower Back - Pain   HPI: Melinda Stone is a 42 y.o. female who comes in today as a self referral for evaluation of chronic, worsening and severe left sided lower back pain radiating down left lateral leg. Pain ongoing for several years, worsens with activity and movement. She describes her pain as cramping and sore sensation, currently rates as 10 out of 10. She has tried home exercise regimen, rest and medications with minimal relief of pain. She has been seen in the emergency department multiple times for chronic back issues. She is managed from a chronic pain standpoint by America Brown, NP with [facility]. She is currently prescribed 120 tablets of 5-325 mg Percocet monthly. Lumbar MRI imaging from 2023 exhibits small residual/recurrent left subarticular to foraminal disc protrusion at L4-L5. There is also residual/recurrent broad-based left subarticular to foraminal disc protrusion at L5-S1. History of left L5-S1 microdiscectomy in 2022, left L4-L5 microdiscectomy in 2024 with Dr. Annell Greening. More recently she was evaluated by Dr. Willia Craze, per his notes she is not ideal surgical candidate due to history of uncontrolled diabetes, reoccurring infections and low hemoglobin. Patient did request referral to [facility] for second opinion, states she is waiting to hear back from their office. History of left L4 and L5 transforaminal epidural steroid injection performed in our office on 05/24/2022. She reports increased pain post injection. Dr. Christell Constant did mention she might be candidate for spinal cord stimulator. Patient using wheelchair to assist with ambulation. Patient denies focal weakness. No recent trauma or falls.    Oswestry Disability  Index Score 52% 20 to 30 (60%) severe disability: Pain remains the main problem in this group but activities of daily living are affected. These patients require a detailed investigation.  Review of Systems  Musculoskeletal:  Positive for back pain and myalgias.  Neurological:  Negative for tingling, sensory change, focal weakness and weakness.  All other systems reviewed and are negative.  Otherwise per HPI.  Assessment & Plan: Visit Diagnoses:    ICD-10-CM   1. Lumbar radiculopathy  M54.16     2. Intervertebral disc disorders with radiculopathy, lumbar region  M51.16     3. Post laminectomy syndrome  M96.1     4. Chronic pain syndrome  G89.4        Plan: Findings:  Chronic, worsening and severe left sided lower back pain radiating down left lateral leg in the setting of post laminectomy syndrome and chronic pain. Patient continues to have severe pain despite good conservative therapies such as home exercise regimen, rest and use of medications. Patients clinical presentation and exam are consistent with L5 nerve pattern. Increased pain post lumbar epidural steroid injection in March. At this point, we would not recommend repeating interventional spine procedures. She can continue chronic pain management with [facility]. I encouraged her to call [facility] to get more info regarding recent surgical referral. Patient is not ideal candidate for spinal cord stimulator as she does have chronic issues with uncontrolled diabetes. Patient is understanding of plan, she has no questions at this time. No red flag symptoms noted upon exam.    Meds & Orders: No orders of the defined types were placed in this encounter.  No orders of  the defined types were placed in this encounter.   Follow-up: Return if symptoms worsen or fail to improve.   Procedures: No procedures performed      Clinical History: MRI LUMBAR SPINE WITHOUT AND WITH CONTRAST   TECHNIQUE: Multiplanar and multiecho  pulse sequences of the lumbar spine were obtained without and with intravenous contrast.   CONTRAST:  17mL MULTIHANCE GADOBENATE DIMEGLUMINE 529 MG/ML IV SOLN   COMPARISON:  Prior MRI from 12/02/2020.   FINDINGS: Segmentation: Standard. Lowest well-formed disc space labeled the L5-S1 level.   Alignment: Slight focal kyphotic angulation at L4-5, stable. Trace 3 mm retrolisthesis of L5 on S1, also unchanged. Alignment otherwise normal with preservation of the normal lumbar lordosis.   Vertebrae: Vertebral body height maintained without acute or interval fracture. Bone marrow signal intensity diffusely heterogeneous without worrisome osseous lesion. Discogenic reactive endplate change with marrow edema and enhancement present about the L4-5 interspace, somewhat progressed from prior. No other abnormal marrow edema or enhancement.   Conus medullaris and cauda equina: Conus extends to the L1 level. Conus and cauda equina appear normal.   Paraspinal and other soft tissues: Chronic postoperative changes present within the posterior paraspinous soft tissues. No adverse features.   Disc levels:   L1-2:  Unremarkable.   L2-3: Negative interspace. Mild ligament flavum hypertrophy. No canal or foraminal stenosis.   L3-4: Negative interspace. Mild bilateral facet and ligament flavum hypertrophy. No significant spinal stenosis. Foramina remain patent.   L4-5: Degenerative intervertebral disc space narrowing with disc desiccation and diffuse disc bulge. Associated reactive endplate change with marginal endplate osteophytic spurring. Postoperative changes from prior wide posterior decompression. Enhancing granulation tissue present within the epidural space bilaterally. Small nonenhancing residual and/or recurrent left subarticular to foraminal disc protrusion is seen (series 9, image 32). Disc material closely approximates both the exiting left L4 and descending L5 nerve roots. Mild  bilateral facet hypertrophy with trace joint effusion on the right. No significant canal or lateral recess stenosis. Mild to moderate bilateral L4 foraminal stenosis.   L5-S1: Trace retrolisthesis. Degenerative intervertebral disc space narrowing with disc desiccation. Associated reactive endplate spurring. Sequelae of prior left hemi laminectomy and partial facetectomy. Residual and/or recurrent broad-based nonenhancing left subarticular to foraminal disc protrusion (series 9, image 38). Disc material closely approximates both the exiting left L5 and descending S1 nerve roots. Residual mild left lateral recess stenosis. Central canal remains patent. Mild to moderate bilateral L5 foraminal stenosis.   IMPRESSION: 1. Postoperative changes from prior posterior decompression at L4-5. Small residual and/or recurrent left subarticular to foraminal disc protrusion at this level, closely approximating and potentially affecting either the exiting left L4 or descending L5 nerve roots. 2. Postoperative changes from prior left posterior decompression at L5-S1. Residual and/or recurrent broad-based left subarticular to foraminal disc protrusion at this level, closely approximating and potentially affecting either the exiting left L5 or descending S1 nerve roots.     Electronically Signed   By: Rise Mu M.D.   On: 09/26/2021 05:12   She reports that she quit smoking about 12 months ago. Her smoking use included cigars. She has never used smokeless tobacco.  Recent Labs    02/06/22 1045 04/16/22 0837 10/16/22 0916  HGBA1C 4.4* 5.6 7.9*    Objective:  VS:  HT:    WT:   BMI:     BP:   HR: bpm  TEMP: ( )  RESP:  Physical Exam Vitals and nursing note reviewed.  HENT:     Head:  Normocephalic and atraumatic.     Right Ear: External ear normal.     Left Ear: External ear normal.     Nose: Nose normal.     Mouth/Throat:     Mouth: Mucous membranes are moist.  Eyes:      Extraocular Movements: Extraocular movements intact.  Cardiovascular:     Rate and Rhythm: Normal rate.     Pulses: Normal pulses.  Pulmonary:     Effort: Pulmonary effort is normal.  Abdominal:     General: Abdomen is flat. There is no distension.  Musculoskeletal:        General: Tenderness present.     Cervical back: Normal range of motion.     Comments: Patient is unable to stand without assistance. Her exam today was difficult due to severe pain. Good lumbar range of motion. No pain noted with facet loading. 5/5 strength noted with right hip flexion, knee flexion/extension, ankle dorsiflexion/plantarflexion and EHL.4/5 strength noted with left ankle dorsiflexion and EHL. 5/5 left hip flexion and knee flexion/extension.No clonus noted bilaterally. No pain upon palpation of greater trochanters. No pain with internal/external rotation of bilateral hips. Sensation intact bilaterally. Negative slump test bilaterally. Using wheelchair to assist with ambulation, antalgic gait.   Skin:    General: Skin is warm and dry.     Capillary Refill: Capillary refill takes less than 2 seconds.  Neurological:     Mental Status: She is alert and oriented to person, place, and time.     Gait: Gait abnormal.  Psychiatric:        Mood and Affect: Mood normal.        Behavior: Behavior normal.    Ortho Exam  Imaging: No results found.  Past Medical/Family/Surgical/Social History: Medications & Allergies reviewed per EMR, new medications updated. Patient Active Problem List   Diagnosis Date Noted  . DKA (diabetic ketoacidosis) (HCC) 12/24/2022  . DKA, type 2 (HCC) 12/24/2022  . Migraine 07/11/2022  . Hypokalemia 05/14/2022  . Normocytic anemia 05/14/2022  . Pneumonitis 05/13/2022  . Grade I diastolic dysfunction 05/13/2022  . Acute pharyngitis 05/13/2022  . Carpal tunnel syndrome 05/13/2022  . Pain of breast 05/13/2022  . Contusion of upper limb 05/13/2022  . Obesity 05/13/2022  . Overweight  (BMI 25.0-29.9) 05/13/2022  . Aspirat pneumonitis due to anesth during preg, first tri 05/05/2022  . Need for prophylactic vaccination and inoculation against influenza 02/07/2022  . Low serum vitamin B12 01/30/2022  . IDA (iron deficiency anemia) 01/30/2022  . Mild intermittent asthma 01/15/2022  . Esophageal reflux 01/15/2022  . Dyspnea 09/01/2021  . Pulmonary nodules 09/01/2021  . Absolute anemia 09/01/2021  . Tobacco use 09/01/2021  . Hypotension 09/01/2021  . Nausea & vomiting 06/08/2021  . UTI (urinary tract infection) 06/08/2021  . Post laminectomy syndrome 04/11/2021  . HNP (herniated nucleus pulposus), lumbar 11/14/2020  . 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 Chronic pain syndrome 06/14/2020  . Pharmacologic therapy 06/14/2020  . Disorder of skeletal system 06/14/2020  . Problems influencing health status 06/14/2020  . History of marijuana use 06/14/2020  . History of illicit drug use 06/14/2020  . Abnormal drug screen (05/25/2020) 06/14/2020  . Marijuana use 06/14/2020  . Abnormal MRI, lumbar spine (05/16/2020) 06/14/2020  . 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 Lumbar lateral recess stenosis (Left: L4-5, L5-S1) 06/14/2020  . 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 S/P lumbar microdiscectomy 06/07/2020  . 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  .  GI bleed 12/01/2019  . Muscle spasm 11/06/2019  . Sciatica 11/06/2019  . Low back pain 02/04/2019  . Type 2 diabetes mellitus with diabetic polyneuropathy, without long-term current use of insulin (HCC) 12/03/2018  . Type 2 diabetes mellitus with hyperglycemia, without long-term current use of insulin (HCC) 05/15/2018  . Acute bronchitis 12/01/2014   Past Medical History:  Diagnosis Date  . Anemia   . Arthritis 08/17/2020  . Chronic back pain   . Diabetes mellitus without complication (HCC)    type 2  . GERD (gastroesophageal reflux  disease)   . Migraine   . Ovarian cyst   . Pneumonia   . Polyneuropathy    Family History  Problem Relation Age of Onset  . Diabetes Mother   . Hypertension Mother   . Stroke Mother    Past Surgical History:  Procedure Laterality Date  . CESAREAN SECTION    . CHOLECYSTECTOMY  2004  . LUMBAR LAMINECTOMY N/A 05/30/2020   Procedure: Lumbar five Laminectomy,  Bilateral Microdiscectomy, Left Lumbar five -Sacral one Microdiscectomy;  Surgeon: [name], MD;  Location: [facility] OR;  Service: Orthopedics;  Laterality: N/A;  . LUMBAR LAMINECTOMY Left 11/14/2020   Procedure: LEFT LUMBAR FOUR-FIVE MICRODISCECTOMY;  Surgeon: [name], MD;  Location: [facility] OR;  Service: Orthopedics;  Laterality: Left;  . TUBAL LIGATION  10/19/2010   Social History   Occupational History    Comment: home maker  Tobacco Use  . Smoking status: Former    Types: Cigars    Quit date: 12/23/2021    Years since quitting: 1.0  . Smokeless tobacco: Never  . Tobacco comments:    Not smoking as of 12/23/21. Verified 01/15/22 LW  Vaping Use  . Vaping status: Never Used  Substance and Sexual Activity  . Alcohol use: Not Currently  . Drug use: Not Currently    Comment: per patient stopped smoking marijuana Mar 2022  . Sexual activity: Yes    Birth control/protection: Surgical

## 2023-01-17 ENCOUNTER — Ambulatory Visit: Payer: Medicaid Other | Admitting: Internal Medicine

## 2023-01-17 NOTE — Progress Notes (Deleted)
Name: Melinda Stone  Age/ Sex: 42 y.o., female   MRN/ DOB: [id number], May 01, 1980     PCP: Anselm Jungling, NP   Reason for Endocrinology Evaluation: Type 2 Diabetes Mellitus  Initial Endocrine Consultative Visit: 12/03/2018    PATIENT IDENTIFIER: [name] is a 42 y.o. female with a past medical history of T2DM. The patient has followed with Endocrinology clinic since 12/03/2018 for consultative assistance with management of her diabetes.  DIABETIC HISTORY:  Ms. Carrie was diagnosed with DMat age 55. Metformin had caused GI issues and hypoglycemia. Her hemoglobin A1c has ranged from 7.0% in 04/2018, peaking at 7.8% in 10/2018  On her initial visit to our clinic she had an A1c of 7.8%. She was on Jardiance, she had a prescription for Trulicity and Jardiance were too costly for her. We added glipizide .    Atteted to  [facility] 09/2020 but she did not pick it up  Trulicity started 2022 but by 08/2021 she developed GI side effects and we stopped  Started Ozempic 09/2022   Lives with kids 12 and 9 yrs of age   SUBJECTIVE:   During the last visit (10/16/2022): A1c 7.9 % .      Today (01/17/2023): Ms. Berson is here for a follow up on diabetes.  She checks her blood sugars occasionally . The patient has had hypoglycemic episodes since the last clinic visit.    Patient presented to the ED with hyperglycemia secondary to viral URI and gastroenteritis, was found to have DKA, was treated with IV fluids as well as insulin 12/2022  She has chronic abdominal pain, despite being off trulicity for months, had recent colonoscopy. She continues with occasional vomiting , no change in bowel movements.    HOME DIABETES REGIMEN:  Glipizide 10 mg 2 tabs before breakfast and 1 tab before Supper  Jardiance 25 mg daily Ozempic 0.5 mg weekly    METER DOWNLOAD SUMMARY: n/a   DIABETIC COMPLICATIONS: Microvascular complications:   B/L retinopathy , S/P injections   Denies: CKD, neuropathy  Last eye exam: Completed 08/2021   Macrovascular complications:    Denies: CAD, PVD, CVA   HISTORY:  Past Medical History:  Past Medical History:  Diagnosis Date   Anemia    Arthritis 08/17/2020   Chronic back pain    Diabetes mellitus without complication (HCC)    type 2   GERD (gastroesophageal reflux disease)    Migraine    Ovarian cyst    Pneumonia    Polyneuropathy    Past Surgical History:  Past Surgical History:  Procedure Laterality Date   CESAREAN SECTION     CHOLECYSTECTOMY  2004   LUMBAR LAMINECTOMY N/A 05/30/2020   Procedure: Lumbar five Laminectomy,  Bilateral Microdiscectomy, Left Lumbar five -Sacral one Microdiscectomy;  Surgeon: [name], MD;  Location: [facility] OR;  Service: Orthopedics;  Laterality: N/A;   LUMBAR LAMINECTOMY Left 11/14/2020   Procedure: LEFT LUMBAR FOUR-FIVE MICRODISCECTOMY;  Surgeon: [name], MD;  Location: [facility] OR;  Service: Orthopedics;  Laterality: Left;   TUBAL LIGATION  10/19/2010   Social History:  reports that she quit smoking about 12 months ago. Her smoking use included cigars. She has never used smokeless tobacco. She reports that she does not currently use alcohol. She reports that she does not currently use drugs. Family History:  Family History  Problem Relation Age of Onset   Diabetes Mother    Hypertension Mother    Stroke Mother  HOME MEDICATIONS: Allergies as of 01/17/2023       Reactions   Trulicity [dulaglutide] Diarrhea        Medication List        Accurate as of January 17, 2023  7:00 AM. If you have any questions, ask your nurse or doctor.          acetaminophen 500 MG tablet Commonly known as: TYLENOL Take 500 mg by mouth every 6 (six) hours as needed for mild pain or moderate pain.   amitriptyline 10 MG tablet Commonly known as: ELAVIL Take 20 mg by mouth at bedtime.   celecoxib 100 MG capsule Commonly known as: CELEBREX Take 1 capsule (100 mg total) by  mouth 2 (two) times daily for 10 days.   cyanocobalamin 1000 MCG tablet Commonly known as: VITAMIN B12 Take 1 tablet (1,000 mcg total) by mouth daily.   Emgality 120 MG/ML Soaj Generic drug: Galcanezumab-gnlm Inject 120 mg into the skin every 30 (thirty) days.   empagliflozin 25 MG Tabs tablet Commonly known as: Jardiance Take 1 tablet (25 mg total) by mouth daily before breakfast.   gabapentin 800 MG tablet Commonly known as: NEURONTIN Take 1 tablet (800 mg total) by mouth in the morning, at noon, in the evening, and at bedtime for 10 days.   Lantus SoloStar 100 UNIT/ML Solostar Pen Generic drug: insulin glargine Inject 30 Units into the skin at bedtime.   methocarbamol 500 MG tablet Commonly known as: ROBAXIN Take 1 tablet (500 mg total) by mouth 2 (two) times daily.   metoprolol tartrate 25 MG tablet Commonly known as: LOPRESSOR Take 1 tablet (25 mg total) by mouth 2 (two) times daily.   NovoLOG FlexPen 100 UNIT/ML FlexPen Generic drug: insulin aspart Before each meal 3 times a day, 140-199 - 2 units, 200-250 - 4 units, 251-299 - 8 units,  300-349 - 10 units,  350 or above 12 units.   ondansetron 4 MG disintegrating tablet Commonly known as: ZOFRAN-ODT Take 1 tablet (4 mg total) by mouth every 8 (eight) hours as needed for nausea or vomiting.   oxyCODONE-acetaminophen 5-325 MG tablet Commonly known as: PERCOCET/ROXICET Take 1 tablet by mouth every 6 (six) hours as needed for moderate pain or severe pain.   pantoprazole 40 MG tablet Commonly known as: PROTONIX Take 1 tablet (40 mg total) by mouth daily.   Semaglutide(0.25 or 0.5MG /DOS) 2 MG/3ML Sopn Inject 0.5 mg into the skin once a week.   SUMAtriptan 50 MG tablet Commonly known as: Imitrex Take 1 tablet (50 mg total) by mouth every 2 (two) hours as needed for migraine. May repeat in 2 hours if headache persists or recurs.   vitamin C 250 MG tablet Commonly known as: ASCORBIC ACID Take 2 tablets (500 mg  total) by mouth daily. What changed: how much to take         OBJECTIVE:   Vital Signs: LMP 12/30/2022 Comment: "on it now"  Wt Readings from Last 3 Encounters:  01/12/23 197 lb (89.4 kg)  01/09/23 197 lb 1.5 oz (89.4 kg)  12/24/22 197 lb (89.4 kg)     Exam: General: Pt appears drowsy, c/o arthralgia   Lungs: Clear with good BS bilat with no rales, rhonchi, or wheezes  Heart: RRR   Extremities: No  pretibial edema.   Neuro: MS is good with appropriate affect, pt is alert and Ox3    DM foot exam: 10/16/2022 The skin of the feet is intact without sores or ulcerations. The pedal  pulses are 2+ on right and 2+ on left. The sensation is absent to a screening 5.07, 10 gram monofilament bilaterally     DATA REVIEWED:  Lab Results  Component Value Date   HGBA1C 7.9 (A) 10/16/2022   HGBA1C 5.6 04/16/2022   HGBA1C 4.4 (L) 02/06/2022     Latest Reference Range & Units 01/12/23 05:12  COMPREHENSIVE METABOLIC PANEL  Rpt !!  Sodium 135 - 145 mmol/L 129 (L)  Potassium 3.5 - 5.1 mmol/L 5.0  Chloride 98 - 111 mmol/L 100  CO2 22 - 32 mmol/L 20 (L)  Glucose 70 - 99 mg/dL 409 (HH)  BUN 6 - 20 mg/dL 20  Creatinine 8.11 - 9.14 mg/dL 7.82  Calcium 8.9 - 95.6 mg/dL 8.6 (L)  Anion gap 5 - 15  9  Alkaline Phosphatase 38 - 126 U/L 91  Albumin 3.5 - 5.0 g/dL 4.2  AST 15 - 41 U/L 11 (L)  ALT 0 - 44 U/L 12  Total Protein 6.5 - 8.1 g/dL 7.7  Total Bilirubin 0.3 - 1.2 mg/dL 1.1  GFR, Estimated >21 mL/min >60     ASSESSMENT / PLAN / RECOMMENDATIONS:   1) Type 2 Diabetes Mellitus, Poorly controlled, With Neuropathic and retinopathic complications - Most recent A1c of 7.9  %. Goal A1c < 7.0 %.     - A1c skewed due to recent iron infusions , and I suspect that her A1c is higher than 7.9% -In office BG 422 mg/DL -She initially stated that she does not drink any sugar sweetened beverages, but upon further questioning about her breakfast this morning the patient did have 2 patches of  [place] sun, patient counseled again about avoiding sugar sweetened beverages and limiting carbohydrate intake -No meter today -She did develop GI side effects with Trulicity, the patient did like being on GLP-1 agonist as it  controlled her glucose better than anything else .  We discussed trying Ozempic, cautioned against GI side effects, patient will monitor if her underlying chronic GI side effects would worsen or remain stable -I will increase her Jardiance as below -Discussed risk of hypoglycemia with glipizide, patient to contact us with hypoglycemia so we will reduce glipizide dose   MEDICATIONS: Continue   glipizide 10 mg 2 tabs Before Breakfast and 1 tablet  before Supper  Increase Jardiance 25 mg daily Start Ozempic 0.25 mg weekly for 6 weeks, then increase to 0.5 mg weekly     EDUCATION / INSTRUCTIONS: BG monitoring instructions: Patient is instructed to check her blood sugars 2 times a day, fasting and supper time. Call [facility] if: BG persistently < 70  I reviewed the Rule of 15 for the treatment of hypoglycemia in detail with the patient. Literature supplied.    F/U in 3 months    Signed electronically by: Lyndle Herrlich, MD  [facility]  [facility] [address] [place] [place], [place] [address] Phone: [phone] FAX: [phone]   CC: Anselm Jungling, NP [address] [address] [place] [address] Phone: [phone]  Fax: [phone]  Return to Endocrinology clinic as below: Future Appointments  Date Time Provider Department Center  01/17/2023  8:10 AM Purl Claytor, Konrad Dolores, MD [facility] None  01/31/2023  9:45 AM Glean Salvo, NP [facility] None  02/12/2023  9:00 AM Salena Saner, MD [facility] None  04/22/2023 11:30 AM [facility] None  04/23/2023  1:00 PM Rickard Patience, MD [facility] None  04/23/2023  1:15 PM [facility]FUSION CHAIR 2  [facility] None

## 2023-01-19 DIAGNOSIS — Z716 Tobacco abuse counseling: Secondary | ICD-10-CM | POA: Diagnosis not present

## 2023-01-19 DIAGNOSIS — M545 Low back pain, unspecified: Secondary | ICD-10-CM | POA: Diagnosis not present

## 2023-01-19 DIAGNOSIS — F17210 Nicotine dependence, cigarettes, uncomplicated: Secondary | ICD-10-CM | POA: Diagnosis not present

## 2023-01-19 DIAGNOSIS — M5416 Radiculopathy, lumbar region: Secondary | ICD-10-CM | POA: Diagnosis not present

## 2023-01-22 ENCOUNTER — Other Ambulatory Visit: Payer: Self-pay

## 2023-01-22 DIAGNOSIS — M5416 Radiculopathy, lumbar region: Secondary | ICD-10-CM | POA: Diagnosis not present

## 2023-01-22 DIAGNOSIS — M4636 Infection of intervertebral disc (pyogenic), lumbar region: Secondary | ICD-10-CM | POA: Diagnosis not present

## 2023-01-23 ENCOUNTER — Other Ambulatory Visit: Payer: Self-pay | Admitting: Orthopedic Surgery

## 2023-01-23 ENCOUNTER — Encounter: Payer: Self-pay | Admitting: Orthopedic Surgery

## 2023-01-23 DIAGNOSIS — M545 Low back pain, unspecified: Secondary | ICD-10-CM

## 2023-01-30 NOTE — Progress Notes (Unsigned)
Patient: Melinda Stone Date of Birth: [birth year]  Reason for Visit: Follow up History from: Patient Primary Neurologist: Penumalli   ASSESSMENT AND PLAN 42 y.o. year old female   1.  Chronic migraine headache 2.  Chronic pain syndrome -Currently having 2-3 migraines a week -Start Emgality for migraine prevention with loading dose, followed 30 days later by maintenance dose -Try Imitrex 50 mg as needed for acute headache -Check urine pregnancy for Medicaid requirement prior to CGRP, is not planning pregnancy, had tubes tied, but still has menstrual cycle, pregnancy not advised until all CGRP for 6 months -Previously tried and failed: Amitriptyline, Topamax, Maxalt, gabapentin, Percocet, tizanidine -Next steps: Ubrelvy for rescue -For now, we will continue amitriptyline, it does help with her sleeping, would like to consider weaning off once established on CGRP -Follow-up in 6 months or sooner if needed  No orders of the defined types were placed in this encounter.   HISTORY OF PRESENT ILLNESS: Today 01/30/23   07/11/22 SS: Had MRI of the brain with and without contrast October 2023 was unremarkable. Amitriptyline started last visit 25 mg at bedtime. No change in headaches, but it helps her sleep. Still having 2-3 a week. Are frontal, occipital. Migraine features. Takes excedrin migraine and tylenol, doesn't help. Tried Maxalt in the past without benefit. Chronic back pain on oxycodone. Hospitalized for PNA in Feb 2024. Supposed to have fusion in lower back, reports orthopedics won't operate due to low iron levels, ESI caused more pain than help. Get a 2nd opinion, continue going to pain management. Trying for awhile to get disability, going through appeal.   HISTORY  UPDATE (06/20/21, VRP): Since last visit, now with migraine x 2 months. Similar HA in her 20's. Gen throbbing, nausea, sens to light and sound HA. Now 3-4 per week.   PRIOR HPI (11/07/20): 42 year old female with  type 2 diabetes and chronic low back pain here for evaluation of lower extremity pain and numbness.   Patient has had diabetes since age 53 years old.  She has had lower extremity numbness and tingling for past 3 to 4 years.  She is also been diagnosed with lumbar spinal stenosis and degenerative changes, status post surgery in March 2022.  Follow-up postoperative imaging demonstrates residual spinal stenosis and radiculopathy changes and she is planning to have additional surgery in the next few weeks.   Patient has been under care of pain management in the past.  She has tried gabapentin, Lyrica, Cymbalta, Robaxin, hydrocodone and tramadol in the past  REVIEW OF SYSTEMS: Out of a complete 14 system review of symptoms, the patient complains only of the following symptoms, and all other reviewed systems are negative.  See HPI  ALLERGIES: Allergies  Allergen Reactions   Trulicity [Dulaglutide] Diarrhea    HOME MEDICATIONS: Outpatient Medications Prior to Visit  Medication Sig Dispense Refill   acetaminophen (TYLENOL) 500 MG tablet Take 500 mg by mouth every 6 (six) hours as needed for mild pain or moderate pain.     amitriptyline (ELAVIL) 10 MG tablet Take 20 mg by mouth at bedtime.     cyanocobalamin (VITAMIN B12) 1000 MCG tablet Take 1 tablet (1,000 mcg total) by mouth daily. 90 tablet 1   empagliflozin (JARDIANCE) 25 MG TABS tablet Take 1 tablet (25 mg total) by mouth daily before breakfast. 90 tablet 3   gabapentin (NEURONTIN) 800 MG tablet Take 1 tablet (800 mg total) by mouth in the morning, at noon, in the evening, and at bedtime  for 10 days. 40 tablet 0   Galcanezumab-gnlm (EMGALITY) 120 MG/ML SOAJ Inject 120 mg into the skin every 30 (thirty) days. 1.12 mL 11   insulin aspart (NOVOLOG) 100 UNIT/ML FlexPen Before each meal 3 times a day, 140-199 - 2 units, 200-250 - 4 units, 251-299 - 8 units,  300-349 - 10 units,  350 or above 12 units. 15 mL 0   insulin glargine (LANTUS SOLOSTAR)  100 UNIT/ML Solostar Pen Inject 30 Units into the skin at bedtime. 9 mL 0   methocarbamol (ROBAXIN) 500 MG tablet Take 1 tablet (500 mg total) by mouth 2 (two) times daily. 20 tablet 0   metoprolol tartrate (LOPRESSOR) 25 MG tablet Take 1 tablet (25 mg total) by mouth 2 (two) times daily. 60 tablet 0   ondansetron (ZOFRAN-ODT) 4 MG disintegrating tablet Take 1 tablet (4 mg total) by mouth every 8 (eight) hours as needed for nausea or vomiting. 20 tablet 0   oxyCODONE-acetaminophen (PERCOCET/ROXICET) 5-325 MG tablet Take 1 tablet by mouth every 6 (six) hours as needed for moderate pain or severe pain.     pantoprazole (PROTONIX) 40 MG tablet Take 1 tablet (40 mg total) by mouth daily. 30 tablet 0   Semaglutide,0.25 or 0.5MG /DOS, 2 MG/3ML SOPN Inject 0.5 mg into the skin once a week. 9 mL 3   SUMAtriptan (IMITREX) 50 MG tablet Take 1 tablet (50 mg total) by mouth every 2 (two) hours as needed for migraine. May repeat in 2 hours if headache persists or recurs. 10 tablet 5   vitamin C (ASCORBIC ACID) 250 MG tablet Take 2 tablets (500 mg total) by mouth daily. (Patient taking differently: Take 250 mg by mouth daily.) 180 tablet 0   No facility-administered medications prior to visit.    PAST MEDICAL HISTORY: Past Medical History:  Diagnosis Date   Anemia    Arthritis 08/17/2020   Chronic back pain    Diabetes mellitus without complication (HCC)    type 2   GERD (gastroesophageal reflux disease)    Migraine    Ovarian cyst    Pneumonia    Polyneuropathy     PAST SURGICAL HISTORY: Past Surgical History:  Procedure Laterality Date   CESAREAN SECTION     CHOLECYSTECTOMY  2004   LUMBAR LAMINECTOMY N/A 05/30/2020   Procedure: Lumbar five Laminectomy,  Bilateral Microdiscectomy, Left Lumbar five -Sacral one Microdiscectomy;  Surgeon: [name], MD;  Location: [facility] OR;  Service: Orthopedics;  Laterality: N/A;   LUMBAR LAMINECTOMY Left 11/14/2020   Procedure: LEFT LUMBAR FOUR-FIVE  MICRODISCECTOMY;  Surgeon: [name], MD;  Location: [facility] OR;  Service: Orthopedics;  Laterality: Left;   TUBAL LIGATION  10/19/2010    FAMILY HISTORY: Family History  Problem Relation Age of Onset   Diabetes Mother    Hypertension Mother    Stroke Mother     SOCIAL HISTORY: Social History   Socioeconomic History   Marital status: Single    Spouse name: Not on file   Number of children: 3   Years of education: Not on file   Highest education level: High school graduate  Occupational History    Comment: home maker  Tobacco Use   Smoking status: Former    Types: Cigars    Quit date: 12/23/2021    Years since quitting: 1.1   Smokeless tobacco: Never   Tobacco comments:    Not smoking as of 12/23/21. Verified 01/15/22 LW  Vaping Use   Vaping status: Never Used  Substance and Sexual Activity   Alcohol use: Not Currently   Drug use: Not Currently    Comment: per patient stopped smoking marijuana Mar 2022   Sexual activity: Yes    Birth control/protection: Surgical  Other Topics Concern   Not on file  Social History Narrative   Lives with 3 children   Some coffee   Social Determinants of Health   Financial Resource Strain: Not on file  Food Insecurity: No Food Insecurity (12/24/2022)   Hunger Vital Sign    Worried About Running Out of Food in the Last Year: Never true    Ran Out of Food in the Last Year: Never true  Transportation Needs: No Transportation Needs (12/24/2022)   PRAPARE - Administrator, Civil Service (Medical): No    Lack of Transportation (Non-Medical): No  Physical Activity: Not on file  Stress: Not on file  Social Connections: Unknown (07/17/2021)   Received from [facility], [facility]   Social Network    Social Network: Not on file  Intimate Partner Violence: Not At Risk (12/24/2022)   Humiliation, Afraid, Rape, and Kick questionnaire    Fear of Current or Ex-Partner: No    Emotionally Abused: No    Physically Abused: No     Sexually Abused: No    PHYSICAL EXAM  There were no vitals filed for this visit.  There is no height or weight on file to calculate BMI.  Generalized: Well developed, in no acute distress, disheveled, tired  Neurological examination  Mentation: Alert oriented to time, place, history taking. Follows all commands speech and language fluent Cranial nerve II-XII: Pupils were equal round reactive to light. Extraocular movements were full, visual field were full on confrontational test. Facial sensation and strength were normal.  Head turning and shoulder shrug  were normal and symmetric. Motor: The motor testing reveals 5 over 5 strength of all extremities, exception 4/5 left hip flexion Sensory: Sensory testing is intact to soft touch on all 4 extremities. No evidence of extinction is noted.  Coordination: Cerebellar testing reveals good finger-nose-finger and heel-to-shin bilaterally.  Gait and station: Gait is wide-based, cautious, wearing back brace Reflexes: Deep tendon reflexes are symmetric but decreased  DIAGNOSTIC DATA (LABS, IMAGING, TESTING) - I reviewed patient records, labs, notes, testing and imaging myself where available.  Lab Results  Component Value Date   WBC 12.2 (H) 01/12/2023   HGB 11.3 (L) 01/12/2023   HCT 33.4 (L) 01/12/2023   MCV 80.3 01/12/2023   PLT 253 01/12/2023      Component Value Date/Time   NA 129 (L) 01/12/2023 0512   NA 136 04/28/2020 1124   K 5.0 01/12/2023 0512   CL 100 01/12/2023 0512   CO2 20 (L) 01/12/2023 0512   GLUCOSE 585 (HH) 01/12/2023 0512   BUN 20 01/12/2023 0512   BUN 4 (L) 04/28/2020 1124   CREATININE 0.85 01/12/2023 0512   CALCIUM 8.6 (L) 01/12/2023 0512   PROT 7.7 01/12/2023 0512   PROT 6.3 04/28/2020 1124   ALBUMIN 4.2 01/12/2023 0512   ALBUMIN 4.0 04/28/2020 1124   AST 11 (L) 01/12/2023 0512   ALT 12 01/12/2023 0512   ALKPHOS 91 01/12/2023 0512   BILITOT 1.1 01/12/2023 0512   BILITOT 1.7 (H) 04/28/2020 1124   GFRNONAA  >60 01/12/2023 0512   GFRAA 139 04/28/2020 1124   Lab Results  Component Value Date   CHOL 96 (L) 04/28/2020   HDL 55 04/28/2020   LDLCALC 23 04/28/2020  TRIG 97 04/28/2020   CHOLHDL 1.7 04/28/2020   Lab Results  Component Value Date   HGBA1C 7.9 (A) 10/16/2022   Lab Results  Component Value Date   VITAMINB12 428 08/09/2022   Lab Results  Component Value Date   TSH 0.657 09/11/2021    Margie Ege, AGNP-C, DNP 01/30/2023, 11:27 AM [facility] [address], [address] [place], [place] [address] [phone]

## 2023-01-31 ENCOUNTER — Ambulatory Visit (INDEPENDENT_AMBULATORY_CARE_PROVIDER_SITE_OTHER): Payer: Medicaid Other | Admitting: Neurology

## 2023-01-31 ENCOUNTER — Encounter: Payer: Self-pay | Admitting: Neurology

## 2023-01-31 VITALS — BP 106/69 | HR 102 | Ht 69.0 in | Wt 197.0 lb

## 2023-01-31 DIAGNOSIS — G43109 Migraine with aura, not intractable, without status migrainosus: Secondary | ICD-10-CM

## 2023-01-31 DIAGNOSIS — G894 Chronic pain syndrome: Secondary | ICD-10-CM | POA: Diagnosis not present

## 2023-01-31 MED ORDER — EMGALITY 120 MG/ML ~~LOC~~ SOAJ: 240.0000 mg | SUBCUTANEOUS | 0 refills | Status: DC

## 2023-01-31 MED ORDER — EMGALITY 120 MG/ML ~~LOC~~ SOAJ: 120.0000 mg | SUBCUTANEOUS | 11 refills | Status: DC

## 2023-01-31 MED ORDER — AMITRIPTYLINE HCL 25 MG PO TABS: 25.0000 mg | ORAL_TABLET | Freq: Every day | ORAL | 6 refills | Status: DC

## 2023-01-31 MED ORDER — UBRELVY 100 MG PO TABS: 100.0000 mg | ORAL_TABLET | ORAL | 11 refills | Status: DC | PRN

## 2023-01-31 NOTE — Patient Instructions (Addendum)
We will start Emgality for migraine prevention, start loading dose with 2 injections the 1st month followed 30 days later by 1 injection for monthly maintenance dosing  Try Ubrelvy at onset of migraine Take 1 tablet at onset of headache, may repeat in 2 hours if needed. Max is 200 mg in 24 hours.   Please let me know if you can't get Emgality!! Will try something else :) thanks   Meds ordered this encounter  Medications   Ubrogepant (UBRELVY) 100 MG TABS    Sig: Take 1 tablet (100 mg total) by mouth as needed. Take 1 tablet at onset of headache, may repeat in 2 hours if needed. Max is 200 mg in 24 hours.    Dispense:  12 tablet    Refill:  11   Galcanezumab-gnlm (EMGALITY) 120 MG/ML SOAJ    Sig: Inject 120 mg into the skin every 30 (thirty) days.    Dispense:  1.12 mL    Refill:  11    Fill this 2nd, this is loading dose   Galcanezumab-gnlm (EMGALITY) 120 MG/ML SOAJ    Sig: Inject 240 mg into the skin every 30 (thirty) days.    Dispense:  2 mL    Refill:  0    Fill this first please as loading dose   amitriptyline (ELAVIL) 25 MG tablet    Sig: Take 1 tablet (25 mg total) by mouth at bedtime.    Dispense:  30 tablet    Refill:  6

## 2023-02-01 LAB — PREGNANCY, URINE: Preg Test, Ur: NEGATIVE

## 2023-02-04 ENCOUNTER — Ambulatory Visit
Admission: RE | Admit: 2023-02-04 | Discharge: 2023-02-04 | Disposition: A | Discharge status: Home / Self Care | Payer: Medicaid Other | Source: Ambulatory Visit | Attending: Orthopedic Surgery | Admitting: Orthopedic Surgery

## 2023-02-04 ENCOUNTER — Ambulatory Visit (HOSPITAL_COMMUNITY)
Admission: EM | Admit: 2023-02-04 | Discharge: 2023-02-04 | Disposition: A | Discharge status: Home / Self Care | Payer: 59 | Attending: Family Medicine | Admitting: Family Medicine

## 2023-02-04 ENCOUNTER — Encounter (HOSPITAL_COMMUNITY): Payer: Self-pay

## 2023-02-04 ENCOUNTER — Ambulatory Visit (INDEPENDENT_AMBULATORY_CARE_PROVIDER_SITE_OTHER): Payer: 59

## 2023-02-04 DIAGNOSIS — R112 Nausea with vomiting, unspecified: Secondary | ICD-10-CM | POA: Diagnosis not present

## 2023-02-04 DIAGNOSIS — E1165 Type 2 diabetes mellitus with hyperglycemia: Secondary | ICD-10-CM | POA: Diagnosis not present

## 2023-02-04 DIAGNOSIS — M545 Low back pain, unspecified: Secondary | ICD-10-CM

## 2023-02-04 LAB — COMPREHENSIVE METABOLIC PANEL
ALT: 8 U/L (ref 0–44)
AST: 20 U/L (ref 15–41)
Albumin: 3.3 g/dL — ABNORMAL LOW (ref 3.5–5.0)
Alkaline Phosphatase: 83 U/L (ref 38–126)
Anion gap: 10 (ref 5–15)
BUN: 12 mg/dL (ref 6–20)
CO2: 19 mmol/L — ABNORMAL LOW (ref 22–32)
Calcium: 8.6 mg/dL — ABNORMAL LOW (ref 8.9–10.3)
Chloride: 103 mmol/L (ref 98–111)
Creatinine, Ser: 0.97 mg/dL (ref 0.44–1.00)
GFR, Estimated: >60 mL/min (ref >60)
Glucose, Bld: 377 mg/dL — ABNORMAL HIGH (ref 70–99)
Potassium: 4 mmol/L (ref 3.5–5.1)
Sodium: 132 mmol/L — ABNORMAL LOW (ref 135–145)
Total Bilirubin: 1.1 mg/dL (ref <1.2)
Total Protein: 6.2 g/dL — ABNORMAL LOW (ref 6.5–8.1)

## 2023-02-04 LAB — CBC WITH DIFFERENTIAL/PLATELET
Abs Immature Granulocytes: 0.05 10*3/uL (ref 0.00–0.07)
Basophils Absolute: 0.1 10*3/uL (ref 0.0–0.1)
Basophils Relative: 1 %
Eosinophils Absolute: 0.4 10*3/uL (ref 0.0–0.5)
Eosinophils Relative: 5 %
HCT: 28.8 % — ABNORMAL LOW (ref 36.0–46.0)
Hemoglobin: 10 g/dL — ABNORMAL LOW (ref 12.0–15.0)
Immature Granulocytes: 1 %
Lymphocytes Relative: 19 %
Lymphs Abs: 1.5 10*3/uL (ref 0.7–4.0)
MCH: 28 pg (ref 26.0–34.0)
MCHC: 34.7 g/dL (ref 30.0–36.0)
MCV: 80.7 fL (ref 80.0–100.0)
Monocytes Absolute: 0.5 10*3/uL (ref 0.1–1.0)
Monocytes Relative: 6 %
Neutro Abs: 5.4 10*3/uL (ref 1.7–7.7)
Neutrophils Relative %: 68 %
Platelets: 217 10*3/uL (ref 150–400)
RBC: 3.57 MIL/uL — ABNORMAL LOW (ref 3.87–5.11)
RDW: 22.8 % — ABNORMAL HIGH (ref 11.5–15.5)
WBC: 7.8 10*3/uL (ref 4.0–10.5)
nRBC: 0 % (ref 0.0–0.2)

## 2023-02-04 LAB — POCT URINALYSIS DIP (MANUAL ENTRY)
Glucose, UA: NEGATIVE mg/dL
Ketones, POC UA: NEGATIVE mg/dL
Leukocytes, UA: NEGATIVE
Nitrite, UA: NEGATIVE
Protein Ur, POC: >=300 mg/dL — AB
Spec Grav, UA: 1.025 (ref 1.010–1.025)
Urobilinogen, UA: 0.2 U/dL
pH, UA: 5.5 (ref 5.0–8.0)

## 2023-02-04 LAB — POCT FASTING CBG KUC MANUAL ENTRY: POCT Glucose (KUC): 399 mg/dL — AB (ref 70–99)

## 2023-02-04 LAB — POCT URINE PREGNANCY: Preg Test, Ur: NEGATIVE

## 2023-02-04 LAB — LIPASE, BLOOD: Lipase: 44 U/L (ref 11–51)

## 2023-02-04 MED ORDER — ONDANSETRON 4 MG PO TBDP: 4.0000 mg | ORAL_TABLET | Freq: Three times a day (TID) | ORAL | 0 refills | Status: DC | PRN

## 2023-02-04 MED ORDER — ONDANSETRON 4 MG PO TBDP
4.0000 mg | ORAL_TABLET | Freq: Once | ORAL | Status: AC
  Administered 2023-02-04: 4 mg via ORAL

## 2023-02-04 MED ORDER — ONDANSETRON 4 MG PO TBDP
ORAL_TABLET | ORAL | Status: AC
  Filled 2023-02-04: qty 1

## 2023-02-04 NOTE — ED Triage Notes (Signed)
Patient reports that she has had mid/upper abdominal pain x 1 week and states she vomits 2-3 every morning after eating. Patient states she has been taking zofran that was prescribed on another visit.

## 2023-02-04 NOTE — Discharge Instructions (Signed)
You have been seen today for abdominal pain. Your evaluation was not suggestive of any emergent condition requiring medical intervention at this time. However, some abdominal problems make take more time to appear. Therefore, it is very important for you to pay attention to any new symptoms or worsening of your current condition.  Please return here or to the Emergency Department immediately should you begin to feel worse in any way or have any of the following symptoms: increasing or different abdominal pain, persistent vomiting, inability to drink fluids, fevers, or shaking chills.   You have had labs (blood tests) sent today. We will call you with any significant abnormalities or if there is need to begin or change treatment or pursue further follow up.  You may also review your test results online through MyChart. If you do not have a MyChart account, instructions to sign up should be on your discharge paperwork.  

## 2023-02-06 ENCOUNTER — Other Ambulatory Visit (HOSPITAL_COMMUNITY): Payer: Self-pay

## 2023-02-06 NOTE — ED Provider Notes (Addendum)
[facility]   [id number] 02/04/23 Arrival Time: 0850  ASSESSMENT & PLAN:  1. Nausea and vomiting, unspecified vomiting type   2. Uncontrolled type 2 diabetes mellitus with hyperglycemia (HCC)    I have personally viewed and independently interpreted the imaging studies ordered this visit. AAS: no acute changes; no signs of SBO; no free air  Labs Reviewed  CBC WITH DIFFERENTIAL/PLATELET   COMPREHENSIVE METABOLIC PANEL   POCT URINALYSIS DIP (MANUAL ENTRY) - Abnormal; Notable for the following components:   Color, UA orange (*)    Bilirubin, UA small (*)    Blood, UA large (*)    Protein Ur, POC >=300 (*)    All other components within normal limits  POCT FASTING CBG KUC MANUAL ENTRY - Abnormal; Notable for the following components:   POCT Glucose (KUC) 399 (*)    All other components within normal limits  LIPASE, BLOOD  POCT URINE PREGNANCY   CMP/CBC/lipase pending.  Unclear etiology. Declines ED evaluation for more thorough workup including imaging if needed. Prefers Zofran and overnight home observation.  Meds ordered this encounter  Medications   ondansetron (ZOFRAN-ODT) disintegrating tablet 4 mg   ondansetron (ZOFRAN-ODT) 4 MG disintegrating tablet    Sig: Take 1 tablet (4 mg total) by mouth every 8 (eight) hours as needed for nausea or vomiting.    Dispense:  15 tablet    Refill:  0    Discussed typical duration of symptoms for suspected viral GI illness. Will do her best to ensure adequate fluid intake in order to avoid dehydration. Will proceed to the Emergency Department for evaluation if unable to tolerate PO intake.   Follow-up Information     Schedule an appointment as soon as possible for a visit  with Anselm Jungling, NP.   Specialty: Nurse Practitioner Why: For follow up. Contact information: [address] [address] [place] [address] [phone]         [facility] Emergency Department at [facility].   Specialty: Emergency  Medicine Why: If symptoms worsen in any way. Contact information: [address] [place] [address] [phone]                 Reviewed expectations re: course of current medical issues. Questions answered. Outlined signs and symptoms indicating need for more acute intervention. Patient verbalized understanding. After Visit Summary given.   SUBJECTIVE: History from: patient.  Melinda Stone is a 42 y.o. female who presents with complaint of non-bilious, non-bloody intermittent n/v; reports usu in the mornings over past week. Better throughout the day. Is assoc with mid to upper non-radiating abd pain. Denies hematemesis. Here reports abd pain is 1/10. Denies urinary symptoms. Patient's last menstrual period was 01/28/2023. Denies vaginal discharge.  Denies recreational drug use.  Past Surgical History:  Procedure Laterality Date   CESAREAN SECTION     CHOLECYSTECTOMY  2004   LUMBAR LAMINECTOMY N/A 05/30/2020   Procedure: Lumbar five Laminectomy,  Bilateral Microdiscectomy, Left Lumbar five -Sacral one Microdiscectomy;  Surgeon: [name], MD;  Location: [facility] OR;  Service: Orthopedics;  Laterality: N/A;   LUMBAR LAMINECTOMY Left 11/14/2020   Procedure: LEFT LUMBAR FOUR-FIVE MICRODISCECTOMY;  Surgeon: [name], MD;  Location: [facility] OR;  Service: Orthopedics;  Laterality: Left;   TUBAL LIGATION  10/19/2010    OBJECTIVE:  Vitals:   02/04/23 1002  BP: 108/71  Pulse: 90  Resp: 16  Temp: 98.2 F (36.8 C)  TempSrc: Oral  SpO2: 98%  General appearance: alert; no distress but appears fatigued Oropharynx: moist Lungs: clear to auscultation bilaterally; unlabored Heart: regular rate and rhythm Abdomen: soft; non-distended; no significant abdominal tenderness; reports "cramping" feeling; bowel sounds present; no masses or organomegaly; no guarding or rebound tenderness Back: no CVA tenderness Extremities: no edema; symmetrical with no  gross deformities Skin: warm; dry Neurologic: normal gait Psychological: alert and cooperative; normal mood and affect   Imaging: DG Abd Acute W/Chest  Result Date: 02/04/2023 CLINICAL DATA:  Upper abdominal pain and vomiting EXAM: DG ABDOMEN ACUTE WITH 1 VIEW CHEST COMPARISON:  Chest x-ray 12/24/2022.  CT same day FINDINGS: No consolidation, pneumothorax or effusion. No edema. Normal cardiopericardial silhouette. Scattered colonic stool. Gas seen in nondilated loops of small and large bowel. No obstruction. Air-fluid level along the stomach on the upright view. No definite free air. Surgical clips in the right upper quadrant. Curvature and degenerative changes along the spine. Degenerative changes of the pelvis. IMPRESSION: Nonspecific bowel gas pattern with some scattered stool. Nonspecific air-fluid level in the stomach. No acute cardiopulmonary disease Electronically Signed   By: Karen Kays M.D.   On: 02/04/2023 13:20    Allergies  Allergen Reactions   Trulicity [Dulaglutide] Diarrhea                                               Past Medical History:  Diagnosis Date   Anemia    Arthritis 08/17/2020   Chronic back pain    Diabetes mellitus without complication (HCC)    type 2   GERD (gastroesophageal reflux disease)    Migraine    Ovarian cyst    Pneumonia    Polyneuropathy    Social History   Socioeconomic History   Marital status: Single    Spouse name: Not on file   Number of children: 3   Years of education: Not on file   Highest education level: High school graduate  Occupational History    Comment: home maker  Tobacco Use   Smoking status: Former    Types: Cigars    Quit date: 12/23/2021    Years since quitting: 1.1   Smokeless tobacco: Never   Tobacco comments:    Not smoking as of 12/23/21. Verified 01/15/22 LW  Vaping Use   Vaping status: Never Used  Substance and Sexual Activity   Alcohol use: Not Currently   Drug use: Not Currently    Comment: per  patient stopped smoking marijuana Mar 2022   Sexual activity: Yes    Birth control/protection: Surgical  Other Topics Concern   Not on file  Social History Narrative   Lives with 3 children   Some coffee   Social Determinants of Health   Financial Resource Strain: Not on file  Food Insecurity: No Food Insecurity (12/24/2022)   Hunger Vital Sign    Worried About Running Out of Food in the Last Year: Never true    Ran Out of Food in the Last Year: Never true  Transportation Needs: No Transportation Needs (12/24/2022)   PRAPARE - Administrator, Civil Service (Medical): No    Lack of Transportation (Non-Medical): No  Physical Activity: Not on file  Stress: Not on file  Social Connections: Unknown (07/17/2021)   Received from [facility], [facility]   Social Network    Social Network: Not on file  Intimate Partner Violence: Not At Risk (12/24/2022)   Humiliation, Afraid, Rape, and Kick questionnaire    Fear of Current or Ex-Partner: No    Emotionally Abused: No    Physically Abused: No    Sexually Abused: No   Family History  Problem Relation Age of Onset   Diabetes Mother    Hypertension Mother    Stroke Mother       Mardella Layman, MD 02/06/23 1014    Mardella Layman, MD 02/06/23 1015

## 2023-02-07 ENCOUNTER — Ambulatory Visit
Admission: RE | Admit: 2023-02-07 | Discharge: 2023-02-07 | Disposition: A | Discharge status: Home / Self Care | Payer: Medicaid Other | Source: Ambulatory Visit | Attending: Orthopedic Surgery | Admitting: Orthopedic Surgery

## 2023-02-07 DIAGNOSIS — M545 Low back pain, unspecified: Secondary | ICD-10-CM

## 2023-02-07 DIAGNOSIS — M5126 Other intervertebral disc displacement, lumbar region: Secondary | ICD-10-CM | POA: Diagnosis not present

## 2023-02-07 MED ORDER — GADOPICLENOL 0.5 MMOL/ML IV SOLN
9.0000 mL | Freq: Once | INTRAVENOUS | Status: AC | PRN
  Administered 2023-02-07: 9 mL via INTRAVENOUS

## 2023-02-08 ENCOUNTER — Other Ambulatory Visit (HOSPITAL_COMMUNITY): Payer: Self-pay

## 2023-02-08 ENCOUNTER — Telehealth: Payer: Self-pay

## 2023-02-08 NOTE — Telephone Encounter (Signed)
Pharmacy Patient Advocate Encounter   Received notification from CoverMyMeds that prior authorization for Ubrelvy 100MG  tablets is required/requested.   Insurance verification completed.   The patient is insured through [facility].   Per test claim: PA required; PA submitted to above mentioned insurance via CoverMyMeds Key/confirmation #/EOC [id number] Status is pending

## 2023-02-08 NOTE — Telephone Encounter (Signed)
Pharmacy Patient Advocate Encounter  Received notification from [facility] that Prior Authorization for Ubrelvy 100MG  tablets has been APPROVED from 02-08-2023 to 02-08-2024. Ran test claim, Copay is $4.00. Patient must fill 15 tabs per 30 days for coverage. This test claim was processed through [facility]- copay amounts may vary at other pharmacies due to pharmacy/plan contracts, or as the patient moves through the different stages of their insurance plan.   PA #/Case ID/Reference #: [id number]

## 2023-02-09 ENCOUNTER — Other Ambulatory Visit: Payer: Self-pay

## 2023-02-09 ENCOUNTER — Emergency Department (HOSPITAL_COMMUNITY): Payer: 59

## 2023-02-09 ENCOUNTER — Emergency Department (HOSPITAL_COMMUNITY)
Admission: EM | Admit: 2023-02-09 | Discharge: 2023-02-09 | Disposition: A | Discharge status: Home / Self Care | Payer: 59 | Attending: Emergency Medicine | Admitting: Emergency Medicine

## 2023-02-09 DIAGNOSIS — Z794 Long term (current) use of insulin: Secondary | ICD-10-CM | POA: Diagnosis not present

## 2023-02-09 DIAGNOSIS — R109 Unspecified abdominal pain: Secondary | ICD-10-CM

## 2023-02-09 DIAGNOSIS — E119 Type 2 diabetes mellitus without complications: Secondary | ICD-10-CM | POA: Diagnosis not present

## 2023-02-09 DIAGNOSIS — R112 Nausea with vomiting, unspecified: Secondary | ICD-10-CM | POA: Insufficient documentation

## 2023-02-09 DIAGNOSIS — R1013 Epigastric pain: Secondary | ICD-10-CM | POA: Diagnosis not present

## 2023-02-09 DIAGNOSIS — Z7984 Long term (current) use of oral hypoglycemic drugs: Secondary | ICD-10-CM | POA: Diagnosis not present

## 2023-02-09 LAB — URINALYSIS, ROUTINE W REFLEX MICROSCOPIC
Bilirubin Urine: NEGATIVE
Glucose, UA: >=500 mg/dL — AB
Ketones, ur: NEGATIVE mg/dL
Leukocytes,Ua: NEGATIVE
Nitrite: NEGATIVE
Protein, ur: 100 mg/dL — AB
Specific Gravity, Urine: 1.021 (ref 1.005–1.030)
pH: 5 (ref 5.0–8.0)

## 2023-02-09 LAB — COMPREHENSIVE METABOLIC PANEL
ALT: 10 U/L (ref 0–44)
AST: 14 U/L — ABNORMAL LOW (ref 15–41)
Albumin: 3.5 g/dL (ref 3.5–5.0)
Alkaline Phosphatase: 85 U/L (ref 38–126)
Anion gap: 4 — ABNORMAL LOW (ref 5–15)
BUN: 14 mg/dL (ref 6–20)
CO2: 23 mmol/L (ref 22–32)
Calcium: 9 mg/dL (ref 8.9–10.3)
Chloride: 109 mmol/L (ref 98–111)
Creatinine, Ser: 0.82 mg/dL (ref 0.44–1.00)
GFR, Estimated: >60 mL/min (ref >60)
Glucose, Bld: 142 mg/dL — ABNORMAL HIGH (ref 70–99)
Potassium: 4 mmol/L (ref 3.5–5.1)
Sodium: 136 mmol/L (ref 135–145)
Total Bilirubin: 1 mg/dL (ref <1.2)
Total Protein: 6.6 g/dL (ref 6.5–8.1)

## 2023-02-09 LAB — CBC
HCT: 31.9 % — ABNORMAL LOW (ref 36.0–46.0)
Hemoglobin: 11 g/dL — ABNORMAL LOW (ref 12.0–15.0)
MCH: 27.4 pg (ref 26.0–34.0)
MCHC: 34.5 g/dL (ref 30.0–36.0)
MCV: 79.6 fL — ABNORMAL LOW (ref 80.0–100.0)
Platelets: 197 10*3/uL (ref 150–400)
RBC: 4.01 MIL/uL (ref 3.87–5.11)
RDW: 21 % — ABNORMAL HIGH (ref 11.5–15.5)
WBC: 9 10*3/uL (ref 4.0–10.5)
nRBC: 0 % (ref 0.0–0.2)

## 2023-02-09 LAB — LIPASE, BLOOD: Lipase: 38 U/L (ref 11–51)

## 2023-02-09 LAB — HCG, SERUM, QUALITATIVE: Preg, Serum: NEGATIVE

## 2023-02-09 MED ORDER — METOCLOPRAMIDE HCL 10 MG PO TABS: 10.0000 mg | ORAL_TABLET | Freq: Four times a day (QID) | ORAL | 0 refills | Status: DC

## 2023-02-09 MED ORDER — IOHEXOL 350 MG/ML SOLN
75.0000 mL | Freq: Once | INTRAVENOUS | Status: AC | PRN
  Administered 2023-02-09: 75 mL via INTRAVENOUS

## 2023-02-09 MED ORDER — PANTOPRAZOLE SODIUM 40 MG IV SOLR
40.0000 mg | Freq: Once | INTRAVENOUS | Status: AC
  Administered 2023-02-09: 40 mg via INTRAVENOUS
  Filled 2023-02-09: qty 10

## 2023-02-09 MED ORDER — LACTATED RINGERS IV BOLUS
1000.0000 mL | Freq: Once | INTRAVENOUS | Status: AC
Start: 2023-02-09 — End: 2023-02-09
  Administered 2023-02-09: 1000 mL via INTRAVENOUS

## 2023-02-09 MED ORDER — ONDANSETRON HCL 4 MG PO TABS: 4.0000 mg | ORAL_TABLET | Freq: Four times a day (QID) | ORAL | 0 refills | Status: DC

## 2023-02-09 MED ORDER — ONDANSETRON HCL 4 MG/2ML IJ SOLN
4.0000 mg | Freq: Once | INTRAMUSCULAR | Status: AC
  Administered 2023-02-09: 4 mg via INTRAVENOUS
  Filled 2023-02-09: qty 2

## 2023-02-09 MED ORDER — KETOROLAC TROMETHAMINE 15 MG/ML IJ SOLN
15.0000 mg | Freq: Once | INTRAMUSCULAR | Status: AC
  Administered 2023-02-09: 15 mg via INTRAVENOUS
  Filled 2023-02-09: qty 1

## 2023-02-09 MED ORDER — METOCLOPRAMIDE HCL 5 MG/ML IJ SOLN
10.0000 mg | Freq: Once | INTRAMUSCULAR | Status: AC
  Administered 2023-02-09: 10 mg via INTRAVENOUS
  Filled 2023-02-09: qty 2

## 2023-02-09 NOTE — ED Provider Notes (Signed)
[place] EMERGENCY DEPARTMENT AT [facility] Provider Note   CSN: [id number] Arrival date & time: 02/09/23  1140     History {Add pertinent medical, surgical, social history, OB history to HPI:1} Chief Complaint  Patient presents with   Abdominal Pain   Emesis   Nausea    Melinda Stone is a 42 y.o. female.   Abdominal Pain Associated symptoms: vomiting   Emesis Associated symptoms: abdominal pain      Patient presented to the ER for evaluation of nausea vomiting abdominal pain.  Patient has a history of diabetes ovarian cyst pneumonia chronic back pain migraines acid reflux.  Patient states over last couple weeks she has been having nausea vomiting not been able to eat.  She is having soreness in her abdomen.  She went to an urgent care on Monday and was given some Zofran but it has not helped much.  No fevers.  No dysuria no diarrhea  Home Medications Prior to Admission medications   Medication Sig Start Date End Date Taking? Authorizing Provider  acetaminophen (TYLENOL) 500 MG tablet Take 500 mg by mouth every 6 (six) hours as needed for mild pain or moderate pain.    [provider]  amitriptyline (ELAVIL) 25 MG tablet Take 1 tablet (25 mg total) by mouth at bedtime. 01/31/23   Glean Salvo, NP  cyanocobalamin (VITAMIN B12) 1000 MCG tablet Take 1 tablet (1,000 mcg total) by mouth daily. 04/10/22   Rickard Patience, MD  gabapentin (NEURONTIN) 800 MG tablet Take 1 tablet (800 mg total) by mouth in the morning, at noon, in the evening, and at bedtime for 10 days. 01/06/23 01/16/23  Meliton Rattan, PA  Galcanezumab-gnlm (EMGALITY) 120 MG/ML SOAJ Inject 120 mg into the skin every 30 (thirty) days. 01/31/23   Glean Salvo, NP  Galcanezumab-gnlm (EMGALITY) 120 MG/ML SOAJ Inject 240 mg into the skin every 30 (thirty) days. 01/31/23   Glean Salvo, NP  insulin aspart (NOVOLOG) 100 UNIT/ML FlexPen Before each meal 3 times a day, 140-199 - 2 units, 200-250 -  4 units, 251-299 - 8 units,  300-349 - 10 units,  350 or above 12 units. 12/27/22   Leroy Sea, MD  insulin glargine (LANTUS SOLOSTAR) 100 UNIT/ML Solostar Pen Inject 30 Units into the skin at bedtime. 12/27/22   Leroy Sea, MD  methocarbamol (ROBAXIN) 500 MG tablet Take 1 tablet (500 mg total) by mouth 2 (two) times daily. 01/12/23   Jeannie Fend, PA-C  metoprolol tartrate (LOPRESSOR) 25 MG tablet Take 1 tablet (25 mg total) by mouth 2 (two) times daily. 12/27/22   Leroy Sea, MD  ondansetron (ZOFRAN-ODT) 4 MG disintegrating tablet Take 1 tablet (4 mg total) by mouth every 8 (eight) hours as needed for nausea or vomiting. 02/04/23   Mardella Layman, MD  oxyCODONE-acetaminophen (PERCOCET/ROXICET) 5-325 MG tablet Take 1 tablet by mouth every 6 (six) hours as needed for moderate pain or severe pain. 03/13/21   [provider]  pantoprazole (PROTONIX) 40 MG tablet Take 1 tablet (40 mg total) by mouth daily. 06/12/21   Alwyn Ren, MD  Semaglutide,0.25 or 0.5MG /DOS, 2 MG/3ML SOPN Inject 0.5 mg into the skin once a week. 10/16/22   Shamleffer, Konrad Dolores, MD  Ubrogepant (UBRELVY) 100 MG TABS Take 1 tablet (100 mg total) by mouth as needed. Take 1 tablet at onset of headache, may repeat in 2 hours if needed. Max is 200 mg in 24 hours. 01/31/23  Glean Salvo, NP  vitamin C (ASCORBIC ACID) 250 MG tablet Take 2 tablets (500 mg total) by mouth daily. Patient taking differently: Take 250 mg by mouth daily. 04/10/22   Rickard Patience, MD      Allergies    Trulicity [dulaglutide]    Review of Systems   Review of Systems  Gastrointestinal:  Positive for abdominal pain and vomiting.    Physical Exam Updated Vital Signs BP (!) 102/59   Pulse 87   Temp 98 F (36.7 C) (Oral)   Resp 16   LMP 01/28/2023   SpO2 100%  Physical Exam Vitals and nursing note reviewed.  Constitutional:      General: She is not in acute distress.    Appearance: She is well-developed.   HENT:     Head: Normocephalic and atraumatic.     Right Ear: External ear normal.     Left Ear: External ear normal.  Eyes:     General: No scleral icterus.       Right eye: No discharge.        Left eye: No discharge.     Conjunctiva/sclera: Conjunctivae normal.  Neck:     Trachea: No tracheal deviation.  Cardiovascular:     Rate and Rhythm: Normal rate and regular rhythm.  Pulmonary:     Effort: Pulmonary effort is normal. No respiratory distress.     Breath sounds: Normal breath sounds. No stridor. No wheezing or rales.  Abdominal:     General: Bowel sounds are normal. There is no distension.     Palpations: Abdomen is soft.     Tenderness: There is abdominal tenderness in the epigastric area. There is no guarding or rebound.  Musculoskeletal:        General: No tenderness or deformity.     Cervical back: Neck supple.  Skin:    General: Skin is warm and dry.     Findings: No rash.  Neurological:     General: No focal deficit present.     Mental Status: She is alert.     Cranial Nerves: No cranial nerve deficit, dysarthria or facial asymmetry.     Sensory: No sensory deficit.     Motor: No abnormal muscle tone or seizure activity.     Coordination: Coordination normal.  Psychiatric:        Mood and Affect: Mood normal.     ED Results / Procedures / Treatments   Labs (all labs ordered are listed, but only abnormal results are displayed) Labs Reviewed  COMPREHENSIVE METABOLIC PANEL - Abnormal; Notable for the following components:      Result Value   Glucose, Bld 142 (*)    AST 14 (*)    Anion gap 4 (*)    All other components within normal limits  CBC - Abnormal; Notable for the following components:   Hemoglobin 11.0 (*)    HCT 31.9 (*)    MCV 79.6 (*)    RDW 21.0 (*)    All other components within normal limits  URINALYSIS, ROUTINE W REFLEX MICROSCOPIC - Abnormal; Notable for the following components:   Color, Urine AMBER (*)    APPearance HAZY (*)     Glucose, UA >=500 (*)    Hgb urine dipstick SMALL (*)    Protein, ur 100 (*)    Bacteria, UA RARE (*)    All other components within normal limits  LIPASE, BLOOD  HCG, SERUM, QUALITATIVE    EKG None  Radiology  Procedures Procedures  {Document cardiac monitor, telemetry assessment procedure when appropriate:1}  Medications Ordered in ED Medications  lactated ringers bolus 1,000 mL (has no administration in time range)  pantoprazole (PROTONIX) injection 40 mg (has no administration in time range)  ketorolac (TORADOL) 15 MG/ML injection 15 mg (has no administration in time range)    ED Course/ Medical Decision Making/ A&P   {   Click here for ABCD2, HEART and other calculatorsREFRESH Note before signing :1}                              Medical Decision Making Amount and/or Complexity of Data Reviewed Labs: ordered. Radiology: ordered.  Risk Prescription drug management.   Patient presented to ED for evaluation of abdominal pain.  Laboratory test not suggestive of hepatitis pancreatitis.  However with her complaints of persistent nausea vomiting will CT to evaluate further rule out obstruction.  Patient treated with IV fluids and antiemetics in the ED   Case turned over to Dr Rodena Medin pending ct results. {Document critical care time when appropriate:1} {Document review of labs and clinical decision tools ie heart score, Chads2Vasc2 etc:1}  {Document your independent review of radiology images, and any outside records:1} {Document your discussion with family members, caretakers, and with consultants:1} {Document social determinants of health affecting pt's care:1} {Document your decision making why or why not admission, treatments were needed:1} Final Clinical Impression(s) / ED Diagnoses Final diagnoses:  None    Rx / DC Orders ED Discharge Orders     None

## 2023-02-09 NOTE — ED Provider Notes (Signed)
Patient seen after prior EDP.  Patient is feeling improved.  She now desires discharge home.  Workup is without evidence of significant acute pathology.  Patient understands need for close outpatient follow-up.     Melinda Fines, MD 02/09/23 Ebony Cargo

## 2023-02-09 NOTE — Discharge Instructions (Signed)
Return for any problem.  ?

## 2023-02-09 NOTE — ED Triage Notes (Signed)
Pt. Stated, I've been sick for 2 weeks with N/V and stomach pain and unable to keep anything down. I went to UC on Monday and they gave me some zofran and it has not helped. Im still not able to eat or drink anything.

## 2023-02-12 ENCOUNTER — Encounter: Payer: Self-pay | Admitting: Pulmonary Disease

## 2023-02-12 ENCOUNTER — Ambulatory Visit (INDEPENDENT_AMBULATORY_CARE_PROVIDER_SITE_OTHER): Payer: 59 | Admitting: Pulmonary Disease

## 2023-02-12 ENCOUNTER — Telehealth: Payer: Self-pay | Admitting: Neurology

## 2023-02-12 VITALS — BP 130/76 | HR 102 | Temp 97.7°F | Ht 69.0 in | Wt 197.0 lb

## 2023-02-12 DIAGNOSIS — R0602 Shortness of breath: Secondary | ICD-10-CM

## 2023-02-12 DIAGNOSIS — F17290 Nicotine dependence, other tobacco product, uncomplicated: Secondary | ICD-10-CM

## 2023-02-12 DIAGNOSIS — J4520 Mild intermittent asthma, uncomplicated: Secondary | ICD-10-CM

## 2023-02-12 NOTE — Progress Notes (Signed)
Subjective:    Patient ID: Melinda Stone, female    DOB: 28-Aug-1980, 42 y.o.   MRN: [id number]  Patient Care Team: Anselm Jungling, NP as PCP - General (Nurse Practitioner) Rickard Patience, MD as Consulting Physician (Oncology)  Chief Complaint  Patient presents with   Follow-up    DOE. No wheezing. Cough with yellow sputum.    BACKGROUND/INTERVAL: Patient is a 42 year old cigar smoker who follows here for the issue of dyspnea and cough.  Patient was last seen 15 January 2022 by Ames Dura, NP.  HPI Discussed the use of AI scribe software for clinical note transcription with the patient, who gave verbal consent to proceed.  History of Present Illness   The patient, with a history of respiratory issues, reports improved breathing and denies the need for an inhaler. She describes a new onset of leg pain that occurs with prolonged walking, leading to fatigue and shortness of breath. The patient attributes this to muscle weakness due to the pain. This has been ongoing since the onset of the leg and back pain.  The patient is currently on pain medication, which she reports as ineffective. She recently underwent an MRI for further evaluation of the leg pain, suspected to be nerve-related, possibly originating from the back. She is awaiting consultation with an orthopedic specialist to discuss the results and potential need for surgery.  The patient admits to smoking twice daily, once in the morning and once at night. She smokes cigars. She has received her flu shot in the previous month. She denies any sleep disturbances and reports no issues with oxygen levels or sleep apnea based on a previous home sleep test. The patient prefers to continue follow-ups in the [place] office due to proximity.     DATA 08/30/2021 PFTs: FEV1 2.35 L or 77% predicted, FVC 3.02 L or 82% predicted, FEV1/FVC 78%, lung volumes mildly reduced, diffusion capacity moderately reduced but corrects for alveolar  volume. 09/08/2021 echocardiogram: LVEF 60 to 65%, mild LVH, grade 1 DD, mild to moderate tricuspid regurgitation. 09/25/2021 overnight oximetry: Saturations as low as 79% however there were significant swings and variation that a formal sleep test is recommended.   10/24/2021 Home sleep test: No evidence of sleep apnea.  AHI 0 with SpO2 low of 91%.  No need for supplemental oxygen noted. 12/27/2021 high-resolution chest CT: No evidence of interstitial lung disease, no active pulmonary disease, aberrant right subclavian artery.  Mild thymic hyperplasia, stable.  Review of Systems A 10 point review of systems was performed and it is as noted above otherwise negative.   Patient Active Problem List   Diagnosis Date Noted   DKA (diabetic ketoacidosis) (HCC) 12/24/2022   DKA, type 2 (HCC) 12/24/2022   Migraine 07/11/2022   Hypokalemia 05/14/2022   Normocytic anemia 05/14/2022   Pneumonitis 05/13/2022   Grade I diastolic dysfunction 05/13/2022   Acute pharyngitis 05/13/2022   Carpal tunnel syndrome 05/13/2022   Pain of breast 05/13/2022   Contusion of upper limb 05/13/2022   Obesity 05/13/2022   Overweight (BMI 25.0-29.9) 05/13/2022   Aspirat pneumonitis due to anesth during preg, first tri 05/05/2022   Need for prophylactic vaccination and inoculation against influenza 02/07/2022   Low serum vitamin B12 01/30/2022   IDA (iron deficiency anemia) 01/30/2022   Mild intermittent asthma 01/15/2022   Esophageal reflux 01/15/2022   Dyspnea 09/01/2021   Pulmonary nodules 09/01/2021   Absolute anemia 09/01/2021   Tobacco use 09/01/2021   Hypotension 09/01/2021   Nausea &  vomiting 06/08/2021   UTI (urinary tract infection) 06/08/2021   Post laminectomy syndrome 04/11/2021   HNP (herniated nucleus pulposus), lumbar 11/14/2020   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   Chronic pain syndrome 06/14/2020   Pharmacologic therapy 06/14/2020   Disorder of skeletal system 06/14/2020   Problems  influencing health status 06/14/2020   History of marijuana use 06/14/2020   History of illicit drug use 06/14/2020   Abnormal drug screen (05/25/2020) 06/14/2020   Marijuana use 06/14/2020   Abnormal MRI, lumbar spine (05/16/2020) 06/14/2020   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, L5-S1) 06/14/2020   Lumbar lateral recess stenosis (Left: L4-5, L5-S1) 06/14/2020   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) 06/14/2020   S/P lumbar microdiscectomy 06/07/2020   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 Khj 02/12/2023  Substance Use Topics   Alcohol use: Not Currently    Allergies  Allergen Reactions   Trulicity [Dulaglutide] Diarrhea    Current Meds  Medication Sig   acetaminophen (TYLENOL) 500 MG tablet Take 500 mg by mouth every 6 (six) hours as needed for mild pain or moderate pain.   amitriptyline (ELAVIL) 25 MG tablet Take 1 tablet (25 mg total) by mouth at bedtime.   celecoxib (CELEBREX) 100 MG capsule Take 100 mg by mouth 2 (two) times daily as needed.   cyanocobalamin (VITAMIN B12) 1000 MCG tablet Take 1 tablet (1,000 mcg total) by mouth daily.   Galcanezumab-gnlm (EMGALITY) 120 MG/ML SOAJ Inject 120 mg into the skin every 30 (thirty) days.   Galcanezumab-gnlm (EMGALITY) 120 MG/ML SOAJ Inject 240 mg into the skin every 30 (thirty) days.   insulin aspart (NOVOLOG) 100 UNIT/ML FlexPen Before  each meal 3 times a day, 140-199 - 2 units, 200-250 - 4 units, 251-299 - 8 units,  300-349 - 10 units,  350 or above 12 units.   insulin glargine (LANTUS SOLOSTAR) 100 UNIT/ML Solostar Pen Inject 30 Units into the skin at bedtime.   methocarbamol (ROBAXIN) 500 MG tablet Take 1 tablet (500 mg total) by mouth 2 (two) times daily.   metoCLOPramide (REGLAN) 10 MG tablet Take 1 tablet (10 mg total) by mouth every 6 (six) hours.   metoprolol tartrate (LOPRESSOR) 25 MG tablet Take 1 tablet (25 mg total) by mouth 2 (two) times daily.   ondansetron (ZOFRAN) 4 MG tablet Take 1 tablet (4 mg total) by mouth every 6 (six) hours.   ondansetron (ZOFRAN-ODT) 4 MG disintegrating tablet Take 1 tablet (4 mg total) by mouth every 8 (eight) hours as needed for nausea or vomiting.   oxyCODONE-acetaminophen (PERCOCET/ROXICET) 5-325 MG tablet Take 1 tablet by mouth every 6 (six) hours as needed for moderate pain or severe pain.   pantoprazole (PROTONIX) 40 MG tablet Take 1 tablet (40 mg total) by mouth daily.   Semaglutide,0.25 or 0.5MG /DOS, 2 MG/3ML SOPN Inject 0.5 mg into the skin once  a week.   Ubrogepant (UBRELVY) 100 MG TABS Take 1 tablet (100 mg total) by mouth as needed. Take 1 tablet at onset of headache, may repeat in 2 hours if needed. Max is 200 mg in 24 hours.   vitamin C (ASCORBIC ACID) 250 MG tablet Take 2 tablets (500 mg total) by mouth daily. (Patient taking differently: Take 250 mg by mouth daily.)    Immunization History  Administered Date(s) Administered   Influenza-Unspecified 12/18/2022   Janssen (J&J) SARS-COV-2 Vaccination 08/25/2019   Pneumococcal Polysaccharide-23 05/09/2020   Tdap 08/04/2022        Objective:     BP 130/76 (BP Location: Right Arm, Cuff Size: Normal)   Pulse (!) 102   Temp 97.7 F (36.5 C)   Ht 5\' 9"  (1.753 m)   Wt 197 lb (89.4 kg) Comment: per patient. in a wheelchair  LMP 01/28/2023   SpO2 99%   BMI 29.09 kg/m   SpO2: 99 % O2 Device: None (Room  air)  GENERAL: Well-developed overweight woman, no acute distress, no conversational dyspnea.  Presents in wheelchair. HEAD: Normocephalic, atraumatic.  EYES: Pupils equal, round, reactive to light.  No scleral icterus.  MOUTH: Edentulous, oral mucosa moist.  No thrush. NECK: Supple. No thyromegaly. Trachea midline. No JVD.  No adenopathy. PULMONARY: Good air entry bilaterally.  No adventitious sounds noted. CARDIOVASCULAR: S1 and S2.  Regular rate and rhythm.  No rubs murmurs gallops heard. ABDOMEN: Benign. MUSCULOSKELETAL: No joint deformity, no clubbing, no edema.  Sarcopenia particularly in the lower extremities. NEUROLOGIC: No overt focal deficit, no gait disturbance, speech is fluent. SKIN: Intact,warm,dry. PSYCH: Flat affect.       Assessment & Plan:     ICD-10-CM   1. Mild intermittent asthma without complication  J45.20    Off of maintenance inhaler Use as needed albuterol    2. Shortness of breath  R06.02    Markedly improved Mostly related to deconditioning Query effect of pain medications    3. Cigar smoker  F17.290    Patient counseled regards to discontinuation of tobacco products     Discussion:    Chronic Pain Chronic neuropathic pain affecting the back and legs, causing significant functional limitations, including difficulty walking and dyspnea due to muscle weakness. Awaiting MRI results and orthopedic consultation to determine the necessity of surgical intervention. Current pain medication is ineffective. - Review MRI results - Follow up with orthopedic consultation on Friday - Medications provided for pain may be accentuating shortness of breath  Tobacco Use Disorder Continues to smoke twice daily, contributing to overall health issues. Smoking cessation is recommended to improve respiratory and cardiovascular health. - Advise smoking cessation  General Health Maintenance Received flu shot last month. Home sleep test showed no sleep apnea; oxygen  levels stable. Lung sounds clear with no current respiratory issues. - Continue routine vaccinations - Encourage regular follow-ups  Follow-up - Schedule follow-up appointment in four months at the [place] office.      Gailen Shelter, MD Advanced Bronchoscopy PCCM [facility]    *This note was generated using voice recognition software/Dragon and/or AI transcription program.  Despite best efforts to proofread, errors can occur which can change the meaning. Any transcriptional errors that result from this process are unintentional and may not be fully corrected at the time of dictation.

## 2023-02-12 NOTE — Telephone Encounter (Signed)
Pt called stating that she was told to call the office to let Provider know if her medications got approved or not, and she states that the [facility] was not approved. Please advise.

## 2023-02-12 NOTE — Patient Instructions (Signed)
VISIT SUMMARY:  During today's visit, we discussed your improved breathing and the new onset of leg pain that occurs with prolonged walking. We also reviewed your current pain management, smoking habits, and general health maintenance.  YOUR PLAN:  -CHRONIC PAIN: Chronic pain, particularly neuropathic pain, affects your back and legs, making it difficult to walk and causing shortness of breath due to muscle weakness. We are awaiting the MRI results and your upcoming orthopedic consultation to determine if surgery is needed. Your current pain medication is not effective, you have an upcoming MRI and follow up with the orthopedic specialist on Friday. The orthopedist will address this issue.  -TOBACCO USE DISORDER: Tobacco use disorder means you are dependent on smoking, which can harm your respiratory and cardiovascular health. It is recommended that you quit smoking to improve your overall health.  -GENERAL HEALTH MAINTENANCE: You received your flu shot last month, and your home sleep test showed no sleep apnea with stable oxygen levels. Your lung sounds are clear, and there are no current respiratory issues. Continue with routine vaccinations and regular follow-ups.  INSTRUCTIONS:  Please schedule a follow-up appointment in four months at the [facility] office. Additionally, follow up with the orthopedic specialist on Friday to discuss your MRI results and potential surgical options.

## 2023-02-13 ENCOUNTER — Telehealth: Payer: Self-pay

## 2023-02-13 ENCOUNTER — Other Ambulatory Visit (HOSPITAL_COMMUNITY): Payer: Self-pay

## 2023-02-13 NOTE — Telephone Encounter (Signed)
*  [facility]  Pharmacy Patient Advocate Encounter   Received notification from Pt Calls Messages that prior authorization for Emgality 120MG /ML auto-injectors (migraine)  is required/requested.   Insurance verification completed.   The patient is insured through [facility] .   Per test claim: PA required; PA submitted to above mentioned insurance via CoverMyMeds Key/confirmation #/EOC [id number] Status is pending

## 2023-02-13 NOTE — Telephone Encounter (Signed)
PA request has been Submitted. New Encounter created for follow up. For additional info see Pharmacy Prior Auth telephone encounter from 11/27.

## 2023-02-18 ENCOUNTER — Other Ambulatory Visit (HOSPITAL_COMMUNITY): Payer: Self-pay

## 2023-02-18 NOTE — Telephone Encounter (Signed)
Pharmacy Patient Advocate Encounter  Received notification from [place] [employer] that Prior Authorization for Emgality 120MG /ML auto-injectors (migraine) has been APPROVED from 02/13/2023 to 05/16/2023. Ran test claim, Copay is $4.00. This test claim was processed through [facility]- copay amounts may vary at other pharmacies due to pharmacy/plan contracts, or as the patient moves through the different stages of their insurance plan.   PA #/Case ID/Reference #: [id number]

## 2023-02-20 DIAGNOSIS — M4727 Other spondylosis with radiculopathy, lumbosacral region: Secondary | ICD-10-CM | POA: Diagnosis not present

## 2023-02-20 DIAGNOSIS — Z5919 Other inadequate housing: Secondary | ICD-10-CM | POA: Diagnosis not present

## 2023-02-20 DIAGNOSIS — M1990 Unspecified osteoarthritis, unspecified site: Secondary | ICD-10-CM | POA: Diagnosis not present

## 2023-02-20 DIAGNOSIS — Z720 Tobacco use: Secondary | ICD-10-CM | POA: Diagnosis not present

## 2023-02-20 DIAGNOSIS — K3184 Gastroparesis: Secondary | ICD-10-CM | POA: Diagnosis not present

## 2023-02-20 DIAGNOSIS — I10 Essential (primary) hypertension: Secondary | ICD-10-CM | POA: Diagnosis not present

## 2023-02-20 DIAGNOSIS — Z818 Family history of other mental and behavioral disorders: Secondary | ICD-10-CM | POA: Diagnosis not present

## 2023-02-20 DIAGNOSIS — G894 Chronic pain syndrome: Secondary | ICD-10-CM | POA: Diagnosis not present

## 2023-02-20 DIAGNOSIS — E1142 Type 2 diabetes mellitus with diabetic polyneuropathy: Secondary | ICD-10-CM | POA: Diagnosis not present

## 2023-02-20 DIAGNOSIS — M545 Low back pain, unspecified: Secondary | ICD-10-CM | POA: Diagnosis not present

## 2023-02-20 DIAGNOSIS — Z79891 Long term (current) use of opiate analgesic: Secondary | ICD-10-CM | POA: Diagnosis not present

## 2023-02-20 DIAGNOSIS — Z794 Long term (current) use of insulin: Secondary | ICD-10-CM | POA: Diagnosis not present

## 2023-02-20 DIAGNOSIS — E1143 Type 2 diabetes mellitus with diabetic autonomic (poly)neuropathy: Secondary | ICD-10-CM | POA: Diagnosis not present

## 2023-02-20 DIAGNOSIS — M961 Postlaminectomy syndrome, not elsewhere classified: Secondary | ICD-10-CM | POA: Diagnosis not present

## 2023-02-20 DIAGNOSIS — M791 Myalgia, unspecified site: Secondary | ICD-10-CM | POA: Diagnosis not present

## 2023-02-20 DIAGNOSIS — Z8249 Family history of ischemic heart disease and other diseases of the circulatory system: Secondary | ICD-10-CM | POA: Diagnosis not present

## 2023-02-20 DIAGNOSIS — Z833 Family history of diabetes mellitus: Secondary | ICD-10-CM | POA: Diagnosis not present

## 2023-03-12 DIAGNOSIS — M5416 Radiculopathy, lumbar region: Secondary | ICD-10-CM | POA: Diagnosis not present

## 2023-03-15 ENCOUNTER — Encounter: Payer: Self-pay | Admitting: Oncology

## 2023-03-21 DIAGNOSIS — G894 Chronic pain syndrome: Secondary | ICD-10-CM | POA: Diagnosis not present

## 2023-03-21 DIAGNOSIS — M961 Postlaminectomy syndrome, not elsewhere classified: Secondary | ICD-10-CM | POA: Diagnosis not present

## 2023-03-21 DIAGNOSIS — M791 Myalgia, unspecified site: Secondary | ICD-10-CM | POA: Diagnosis not present

## 2023-03-21 DIAGNOSIS — M4727 Other spondylosis with radiculopathy, lumbosacral region: Secondary | ICD-10-CM | POA: Diagnosis not present

## 2023-03-21 DIAGNOSIS — M545 Low back pain, unspecified: Secondary | ICD-10-CM | POA: Diagnosis not present

## 2023-03-21 DIAGNOSIS — Z79891 Long term (current) use of opiate analgesic: Secondary | ICD-10-CM | POA: Diagnosis not present

## 2023-03-25 ENCOUNTER — Inpatient Hospital Stay (HOSPITAL_COMMUNITY)
Admission: EM | Admit: 2023-03-25 | Discharge: 2023-03-27 | DRG: 871 | Disposition: A | Discharge status: Home / Self Care | Payer: 59 | Attending: Family Medicine | Admitting: Family Medicine

## 2023-03-25 ENCOUNTER — Other Ambulatory Visit: Payer: Self-pay

## 2023-03-25 ENCOUNTER — Emergency Department (HOSPITAL_COMMUNITY): Payer: 59

## 2023-03-25 DIAGNOSIS — J189 Pneumonia, unspecified organism: Secondary | ICD-10-CM | POA: Diagnosis not present

## 2023-03-25 DIAGNOSIS — Z794 Long term (current) use of insulin: Secondary | ICD-10-CM

## 2023-03-25 DIAGNOSIS — Z7985 Long-term (current) use of injectable non-insulin antidiabetic drugs: Secondary | ICD-10-CM

## 2023-03-25 DIAGNOSIS — D500 Iron deficiency anemia secondary to blood loss (chronic): Secondary | ICD-10-CM | POA: Diagnosis present

## 2023-03-25 DIAGNOSIS — Z720 Tobacco use: Secondary | ICD-10-CM | POA: Diagnosis not present

## 2023-03-25 DIAGNOSIS — N920 Excessive and frequent menstruation with regular cycle: Secondary | ICD-10-CM | POA: Diagnosis present

## 2023-03-25 DIAGNOSIS — L03032 Cellulitis of left toe: Secondary | ICD-10-CM | POA: Diagnosis present

## 2023-03-25 DIAGNOSIS — F1991 Other psychoactive substance use, unspecified, in remission: Secondary | ICD-10-CM | POA: Diagnosis present

## 2023-03-25 DIAGNOSIS — A419 Sepsis, unspecified organism: Secondary | ICD-10-CM | POA: Diagnosis not present

## 2023-03-25 DIAGNOSIS — J90 Pleural effusion, not elsewhere classified: Secondary | ICD-10-CM | POA: Diagnosis not present

## 2023-03-25 DIAGNOSIS — L03039 Cellulitis of unspecified toe: Secondary | ICD-10-CM | POA: Insufficient documentation

## 2023-03-25 DIAGNOSIS — R590 Localized enlarged lymph nodes: Secondary | ICD-10-CM

## 2023-03-25 DIAGNOSIS — K219 Gastro-esophageal reflux disease without esophagitis: Secondary | ICD-10-CM | POA: Diagnosis present

## 2023-03-25 DIAGNOSIS — R0602 Shortness of breath: Secondary | ICD-10-CM | POA: Diagnosis not present

## 2023-03-25 DIAGNOSIS — J188 Other pneumonia, unspecified organism: Secondary | ICD-10-CM | POA: Diagnosis present

## 2023-03-25 DIAGNOSIS — F1291 Cannabis use, unspecified, in remission: Secondary | ICD-10-CM | POA: Diagnosis present

## 2023-03-25 DIAGNOSIS — M7989 Other specified soft tissue disorders: Secondary | ICD-10-CM | POA: Diagnosis not present

## 2023-03-25 DIAGNOSIS — E11621 Type 2 diabetes mellitus with foot ulcer: Secondary | ICD-10-CM | POA: Diagnosis present

## 2023-03-25 DIAGNOSIS — I2721 Secondary pulmonary arterial hypertension: Secondary | ICD-10-CM | POA: Diagnosis present

## 2023-03-25 DIAGNOSIS — R591 Generalized enlarged lymph nodes: Secondary | ICD-10-CM | POA: Diagnosis not present

## 2023-03-25 DIAGNOSIS — M961 Postlaminectomy syndrome, not elsewhere classified: Secondary | ICD-10-CM | POA: Diagnosis present

## 2023-03-25 DIAGNOSIS — Z6831 Body mass index (BMI) 31.0-31.9, adult: Secondary | ICD-10-CM

## 2023-03-25 DIAGNOSIS — Z1152 Encounter for screening for COVID-19: Secondary | ICD-10-CM

## 2023-03-25 DIAGNOSIS — Z823 Family history of stroke: Secondary | ICD-10-CM

## 2023-03-25 DIAGNOSIS — Z8701 Personal history of pneumonia (recurrent): Secondary | ICD-10-CM

## 2023-03-25 DIAGNOSIS — G894 Chronic pain syndrome: Secondary | ICD-10-CM | POA: Diagnosis present

## 2023-03-25 DIAGNOSIS — Z716 Tobacco abuse counseling: Secondary | ICD-10-CM

## 2023-03-25 DIAGNOSIS — D509 Iron deficiency anemia, unspecified: Secondary | ICD-10-CM | POA: Diagnosis present

## 2023-03-25 DIAGNOSIS — F17210 Nicotine dependence, cigarettes, uncomplicated: Secondary | ICD-10-CM | POA: Diagnosis present

## 2023-03-25 DIAGNOSIS — Z791 Long term (current) use of non-steroidal anti-inflammatories (NSAID): Secondary | ICD-10-CM

## 2023-03-25 DIAGNOSIS — G43909 Migraine, unspecified, not intractable, without status migrainosus: Secondary | ICD-10-CM | POA: Diagnosis present

## 2023-03-25 DIAGNOSIS — J4520 Mild intermittent asthma, uncomplicated: Secondary | ICD-10-CM | POA: Diagnosis present

## 2023-03-25 DIAGNOSIS — Z79899 Other long term (current) drug therapy: Secondary | ICD-10-CM

## 2023-03-25 DIAGNOSIS — E66811 Obesity, class 1: Secondary | ICD-10-CM | POA: Diagnosis present

## 2023-03-25 DIAGNOSIS — Z888 Allergy status to other drugs, medicaments and biological substances status: Secondary | ICD-10-CM

## 2023-03-25 DIAGNOSIS — Z8249 Family history of ischemic heart disease and other diseases of the circulatory system: Secondary | ICD-10-CM

## 2023-03-25 DIAGNOSIS — L97529 Non-pressure chronic ulcer of other part of left foot with unspecified severity: Secondary | ICD-10-CM | POA: Diagnosis present

## 2023-03-25 DIAGNOSIS — R918 Other nonspecific abnormal finding of lung field: Secondary | ICD-10-CM | POA: Diagnosis not present

## 2023-03-25 DIAGNOSIS — F17290 Nicotine dependence, other tobacco product, uncomplicated: Secondary | ICD-10-CM | POA: Diagnosis present

## 2023-03-25 DIAGNOSIS — M5416 Radiculopathy, lumbar region: Secondary | ICD-10-CM | POA: Diagnosis present

## 2023-03-25 DIAGNOSIS — N390 Urinary tract infection, site not specified: Secondary | ICD-10-CM | POA: Diagnosis present

## 2023-03-25 DIAGNOSIS — J129 Viral pneumonia, unspecified: Secondary | ICD-10-CM | POA: Diagnosis present

## 2023-03-25 DIAGNOSIS — R079 Chest pain, unspecified: Secondary | ICD-10-CM | POA: Diagnosis not present

## 2023-03-25 DIAGNOSIS — Z833 Family history of diabetes mellitus: Secondary | ICD-10-CM

## 2023-03-25 DIAGNOSIS — E1142 Type 2 diabetes mellitus with diabetic polyneuropathy: Secondary | ICD-10-CM | POA: Diagnosis present

## 2023-03-25 LAB — CBC
HCT: 27.7 % — ABNORMAL LOW (ref 36.0–46.0)
Hemoglobin: 9.7 g/dL — ABNORMAL LOW (ref 12.0–15.0)
MCH: 28.4 pg (ref 26.0–34.0)
MCHC: 35 g/dL (ref 30.0–36.0)
MCV: 81 fL (ref 80.0–100.0)
Platelets: 166 10*3/uL (ref 150–400)
RBC: 3.42 MIL/uL — ABNORMAL LOW (ref 3.87–5.11)
RDW: 19.9 % — ABNORMAL HIGH (ref 11.5–15.5)
WBC: 13.6 10*3/uL — ABNORMAL HIGH (ref 4.0–10.5)
nRBC: 0 % (ref 0.0–0.2)

## 2023-03-25 LAB — BASIC METABOLIC PANEL
Anion gap: 8 (ref 5–15)
BUN: 14 mg/dL (ref 6–20)
CO2: 18 mmol/L — ABNORMAL LOW (ref 22–32)
Calcium: 8.6 mg/dL — ABNORMAL LOW (ref 8.9–10.3)
Chloride: 109 mmol/L (ref 98–111)
Creatinine, Ser: 0.58 mg/dL (ref 0.44–1.00)
GFR, Estimated: >60 mL/min (ref >60)
Glucose, Bld: 188 mg/dL — ABNORMAL HIGH (ref 70–99)
Potassium: 4.8 mmol/L (ref 3.5–5.1)
Sodium: 135 mmol/L (ref 135–145)

## 2023-03-25 LAB — RESP PANEL BY RT-PCR (RSV, FLU A&B, COVID)  RVPGX2
Influenza A by PCR: NEGATIVE
Influenza B by PCR: NEGATIVE
Resp Syncytial Virus by PCR: NEGATIVE
SARS Coronavirus 2 by RT PCR: NEGATIVE

## 2023-03-25 LAB — CBG MONITORING, ED: Glucose-Capillary: 284 mg/dL — ABNORMAL HIGH (ref 70–99)

## 2023-03-25 LAB — TROPONIN I (HIGH SENSITIVITY)
Troponin I (High Sensitivity): 19 ng/L — ABNORMAL HIGH (ref <18)
Troponin I (High Sensitivity): 11 ng/L (ref <18)
Troponin I (High Sensitivity): 11 ng/L (ref <18)

## 2023-03-25 LAB — PROCALCITONIN: Procalcitonin: <0.1 ng/mL

## 2023-03-25 LAB — HCG, SERUM, QUALITATIVE: Preg, Serum: NEGATIVE

## 2023-03-25 LAB — I-STAT CG4 LACTIC ACID, ED: Lactic Acid, Venous: 0.8 mmol/L (ref 0.5–1.9)

## 2023-03-25 MED ORDER — SODIUM CHLORIDE 0.9 % IV SOLN: 500.0000 mg | INTRAVENOUS | Status: DC

## 2023-03-25 MED ORDER — METHOCARBAMOL 500 MG PO TABS
500.0000 mg | ORAL_TABLET | Freq: Two times a day (BID) | ORAL | Status: DC
  Administered 2023-03-25 – 2023-03-27 (×4): 500 mg via ORAL
  Filled 2023-03-25 (×4): qty 1

## 2023-03-25 MED ORDER — AMITRIPTYLINE HCL 25 MG PO TABS
25.0000 mg | ORAL_TABLET | Freq: Every day | ORAL | Status: DC
  Administered 2023-03-26 (×2): 25 mg via ORAL
  Filled 2023-03-25 (×2): qty 1

## 2023-03-25 MED ORDER — ONDANSETRON HCL 4 MG PO TABS: 4.0000 mg | ORAL_TABLET | Freq: Four times a day (QID) | ORAL | Status: DC

## 2023-03-25 MED ORDER — IOHEXOL 350 MG/ML SOLN
75.0000 mL | Freq: Once | INTRAVENOUS | Status: AC | PRN
  Administered 2023-03-25: 75 mL via INTRAVENOUS

## 2023-03-25 MED ORDER — IPRATROPIUM-ALBUTEROL 0.5-2.5 (3) MG/3ML IN SOLN: 3.0000 mL | RESPIRATORY_TRACT | Status: DC | PRN

## 2023-03-25 MED ORDER — ONDANSETRON HCL 4 MG PO TABS: 4.0000 mg | ORAL_TABLET | Freq: Four times a day (QID) | ORAL | Status: DC | PRN

## 2023-03-25 MED ORDER — VITAMIN C 500 MG PO TABS
500.0000 mg | ORAL_TABLET | Freq: Every day | ORAL | Status: DC
  Administered 2023-03-26 – 2023-03-27 (×2): 500 mg via ORAL
  Filled 2023-03-25 (×2): qty 1

## 2023-03-25 MED ORDER — OXYCODONE-ACETAMINOPHEN 5-325 MG PO TABS
1.0000 | ORAL_TABLET | Freq: Four times a day (QID) | ORAL | Status: DC | PRN
  Administered 2023-03-25 – 2023-03-26 (×4): 1 via ORAL
  Filled 2023-03-25 (×4): qty 1

## 2023-03-25 MED ORDER — ONDANSETRON HCL 4 MG/2ML IJ SOLN
4.0000 mg | Freq: Four times a day (QID) | INTRAMUSCULAR | Status: DC | PRN
  Administered 2023-03-26 – 2023-03-27 (×2): 4 mg via INTRAVENOUS
  Filled 2023-03-25 (×2): qty 2

## 2023-03-25 MED ORDER — INSULIN ASPART 100 UNIT/ML IJ SOLN
0.0000 [IU] | Freq: Every day | INTRAMUSCULAR | Status: DC
  Administered 2023-03-25: 3 [IU] via SUBCUTANEOUS
  Administered 2023-03-26: 2 [IU] via SUBCUTANEOUS
  Filled 2023-03-25: qty 0.05

## 2023-03-25 MED ORDER — ENOXAPARIN SODIUM 40 MG/0.4ML IJ SOSY
40.0000 mg | PREFILLED_SYRINGE | INTRAMUSCULAR | Status: DC
  Administered 2023-03-26 (×2): 40 mg via SUBCUTANEOUS
  Filled 2023-03-25 (×2): qty 0.4

## 2023-03-25 MED ORDER — SODIUM CHLORIDE 0.9% FLUSH: 3.0000 mL | INTRAVENOUS | Status: DC | PRN

## 2023-03-25 MED ORDER — SODIUM CHLORIDE 0.9 % IV SOLN
1.0000 g | INTRAVENOUS | Status: DC
  Administered 2023-03-26: 1 g via INTRAVENOUS
  Filled 2023-03-25: qty 10

## 2023-03-25 MED ORDER — UBROGEPANT 100 MG PO TABS: 100.0000 mg | ORAL_TABLET | ORAL | Status: DC | PRN

## 2023-03-25 MED ORDER — SODIUM CHLORIDE 0.9 % IV SOLN: 250.0000 mL | INTRAVENOUS | Status: AC | PRN

## 2023-03-25 MED ORDER — SODIUM CHLORIDE 0.9 % IV SOLN
500.0000 mg | Freq: Once | INTRAVENOUS | Status: AC
  Administered 2023-03-25: 500 mg via INTRAVENOUS
  Filled 2023-03-25: qty 5

## 2023-03-25 MED ORDER — POLYETHYLENE GLYCOL 3350 17 G PO PACK: 17.0000 g | PACK | Freq: Every day | ORAL | Status: DC | PRN

## 2023-03-25 MED ORDER — SODIUM CHLORIDE 0.9% FLUSH
3.0000 mL | Freq: Two times a day (BID) | INTRAVENOUS | Status: DC
  Administered 2023-03-25 – 2023-03-27 (×4): 3 mL via INTRAVENOUS

## 2023-03-25 MED ORDER — ACETAMINOPHEN 325 MG PO TABS
650.0000 mg | ORAL_TABLET | Freq: Four times a day (QID) | ORAL | Status: DC | PRN
  Administered 2023-03-26 (×2): 650 mg via ORAL
  Filled 2023-03-25 (×2): qty 2

## 2023-03-25 MED ORDER — DOCUSATE SODIUM 100 MG PO CAPS
100.0000 mg | ORAL_CAPSULE | Freq: Two times a day (BID) | ORAL | Status: DC
  Administered 2023-03-25 – 2023-03-27 (×4): 100 mg via ORAL
  Filled 2023-03-25 (×4): qty 1

## 2023-03-25 MED ORDER — ACETAMINOPHEN 650 MG RE SUPP: 650.0000 mg | Freq: Four times a day (QID) | RECTAL | Status: DC | PRN

## 2023-03-25 MED ORDER — PANTOPRAZOLE SODIUM 40 MG PO TBEC
40.0000 mg | DELAYED_RELEASE_TABLET | Freq: Every day | ORAL | Status: DC
Start: 2023-03-26 — End: 2023-03-27
  Administered 2023-03-26 – 2023-03-27 (×2): 40 mg via ORAL
  Filled 2023-03-25 (×2): qty 1

## 2023-03-25 MED ORDER — METOPROLOL TARTRATE 25 MG PO TABS
25.0000 mg | ORAL_TABLET | Freq: Two times a day (BID) | ORAL | Status: DC
  Administered 2023-03-25 – 2023-03-27 (×4): 25 mg via ORAL
  Filled 2023-03-25 (×4): qty 1

## 2023-03-25 MED ORDER — SODIUM CHLORIDE 0.9 % IV SOLN
2.0000 g | Freq: Once | INTRAVENOUS | Status: AC
  Administered 2023-03-25: 2 g via INTRAVENOUS
  Filled 2023-03-25: qty 20

## 2023-03-25 MED ORDER — INSULIN ASPART 100 UNIT/ML IJ SOLN
0.0000 [IU] | Freq: Three times a day (TID) | INTRAMUSCULAR | Status: DC
  Administered 2023-03-26: 5 [IU] via SUBCUTANEOUS
  Administered 2023-03-26 – 2023-03-27 (×4): 8 [IU] via SUBCUTANEOUS
  Filled 2023-03-25: qty 0.15

## 2023-03-25 MED ORDER — SODIUM CHLORIDE 0.9 % IV BOLUS
1000.0000 mL | Freq: Once | INTRAVENOUS | Status: AC
  Administered 2023-03-25: 1000 mL via INTRAVENOUS

## 2023-03-25 MED ORDER — HYDRALAZINE HCL 20 MG/ML IJ SOLN: 5.0000 mg | Freq: Four times a day (QID) | INTRAMUSCULAR | Status: DC | PRN

## 2023-03-25 NOTE — ED Provider Notes (Addendum)
 [place] [facility]MENT AT [facility] Provider Note   CSN: [id number] Arrival date & time: 03/25/23  1008     History  Chief Complaint  Patient presents with   Chest Pain   Shortness of Breath    Melinda Stone is a 43 y.o. female.  Patient here with cough body aches, history of diabetes.  She is also has some may be infection of her left pinky toe.  She has a history of chronic anemia.  Symptoms for the last couple days.  Nothing makes it worse or better.  She has not noticed any fever.  She has had chest pain shortness of breath as well.  Denies any weakness numbness tingling.  No nausea vomiting diarrhea.  The history is provided by the patient.       Home Medications Prior to Admission medications   Medication Sig Start Date End Date Taking? Authorizing Provider  acetaminophen  (TYLENOL ) 500 MG tablet Take 500 mg by mouth every 6 (six) hours as needed for mild pain or moderate pain.    [provider]  amitriptyline  (ELAVIL ) 25 MG tablet Take 1 tablet (25 mg total) by mouth at bedtime. 01/31/23   Gayland Lauraine PARAS, NP  celecoxib  (CELEBREX ) 100 MG capsule Take 100 mg by mouth 2 (two) times daily as needed. 01/21/23   [provider]  cyanocobalamin  (VITAMIN B12) 1000 MCG tablet Take 1 tablet (1,000 mcg total) by mouth daily. 04/10/22   Babara Call, MD  gabapentin  (NEURONTIN ) 800 MG tablet Take 1 tablet (800 mg total) by mouth in the morning, at noon, in the evening, and at bedtime for 10 days. 01/06/23 01/16/23  Acevedo, Angela, PA  Galcanezumab -gnlm (EMGALITY ) 120 MG/ML SOAJ Inject 120 mg into the skin every 30 (thirty) days. 01/31/23   Gayland Lauraine PARAS, NP  Galcanezumab -gnlm (EMGALITY ) 120 MG/ML SOAJ Inject 240 mg into the skin every 30 (thirty) days. 01/31/23   Gayland Lauraine PARAS, NP  insulin  aspart (NOVOLOG ) 100 UNIT/ML FlexPen Before each meal 3 times a day, 140-199 - 2 units, 200-250 - 4 units, 251-299 - 8 units,  300-349 - 10 units,  350 or  above 12 units. 12/27/22   Singh, Prashant K, MD  insulin  glargine (LANTUS  SOLOSTAR) 100 UNIT/ML Solostar Pen Inject 30 Units into the skin at bedtime. 12/27/22   Singh, Prashant K, MD  methocarbamol  (ROBAXIN ) 500 MG tablet Take 1 tablet (500 mg total) by mouth 2 (two) times daily. 01/12/23   Beverley Leita LABOR, PA-C  metoCLOPramide  (REGLAN ) 10 MG tablet Take 1 tablet (10 mg total) by mouth every 6 (six) hours. 02/09/23   Laurice Maude BROCKS, MD  metoprolol  tartrate (LOPRESSOR ) 25 MG tablet Take 1 tablet (25 mg total) by mouth 2 (two) times daily. 12/27/22   Singh, Prashant K, MD  ondansetron  (ZOFRAN ) 4 MG tablet Take 1 tablet (4 mg total) by mouth every 6 (six) hours. 02/09/23   Laurice Maude BROCKS, MD  ondansetron  (ZOFRAN -ODT) 4 MG disintegrating tablet Take 1 tablet (4 mg total) by mouth every 8 (eight) hours as needed for nausea or vomiting. 02/04/23   Rolinda Rogue, MD  oxyCODONE -acetaminophen  (PERCOCET/ROXICET) 5-325 MG tablet Take 1 tablet by mouth every 6 (six) hours as needed for moderate pain or severe pain. 03/13/21   [provider]  pantoprazole  (PROTONIX ) 40 MG tablet Take 1 tablet (40 mg total) by mouth daily. 06/12/21   Will Almarie MATSU, MD  Semaglutide ,0.25 or 0.5MG /DOS, 2 MG/3ML SOPN Inject 0.5 mg into the  skin once a week. 10/16/22   Shamleffer, Ibtehal Jaralla, MD  Ubrogepant  (UBRELVY ) 100 MG TABS Take 1 tablet (100 mg total) by mouth as needed. Take 1 tablet at onset of headache, may repeat in 2 hours if needed. Max is 200 mg in 24 hours. 01/31/23   Gayland Lauraine PARAS, NP  vitamin C  (ASCORBIC ACID ) 250 MG tablet Take 2 tablets (500 mg total) by mouth daily. Patient taking differently: Take 250 mg by mouth daily. 04/10/22   Babara Call, MD      Allergies    Trulicity  [dulaglutide ]    Review of Systems   Review of Systems  Physical Exam Updated Vital Signs BP (!) 141/70 (BP Location: Right Arm)   Pulse (!) 113   Temp 98.8 F (37.1 C)   Resp 16   Ht 5' 9 (1.753 m)   Wt 97.5  kg   SpO2 99%   BMI 31.75 kg/m  Physical Exam Vitals and nursing note reviewed.  Constitutional:      General: She is not in acute distress.    Appearance: She is well-developed. She is not ill-appearing.  HENT:     Head: Normocephalic and atraumatic.  Eyes:     Conjunctiva/sclera: Conjunctivae normal.  Cardiovascular:     Rate and Rhythm: Normal rate and regular rhythm.     Heart sounds: Normal heart sounds. No murmur heard. Pulmonary:     Effort: Pulmonary effort is normal. No respiratory distress.     Breath sounds: Normal breath sounds.  Abdominal:     Palpations: Abdomen is soft.     Tenderness: There is no abdominal tenderness.  Musculoskeletal:        General: No swelling.     Cervical back: Neck supple.  Skin:    General: Skin is warm and dry.     Capillary Refill: Capillary refill takes less than 2 seconds.     Comments: Little ulcer to her left pinky toe with little bit of drainage no major surrounding cellulitis  Neurological:     Mental Status: She is alert.  Psychiatric:        Mood and Affect: Mood normal.     ED Results / Procedures / Treatments   Labs (all labs ordered are listed, but only abnormal results are displayed) Labs Reviewed  BASIC METABOLIC PANEL - Abnormal; Notable for the following components:      Result Value   CO2 18 (*)    Glucose, Bld 188 (*)    Calcium  8.6 (*)    All other components within normal limits  CBC - Abnormal; Notable for the following components:   WBC 13.6 (*)    RBC 3.42 (*)    Hemoglobin 9.7 (*)    HCT 27.7 (*)    RDW 19.9 (*)    All other components within normal limits  TROPONIN I (HIGH SENSITIVITY) - Abnormal; Notable for the following components:   Troponin I (High Sensitivity) 19 (*)    All other components within normal limits  RESP PANEL BY RT-PCR (RSV, FLU A&B, COVID)  RVPGX2  CULTURE, BLOOD (ROUTINE X 2)  CULTURE, BLOOD (ROUTINE X 2)  HCG, SERUM, QUALITATIVE  I-STAT CG4 LACTIC ACID, ED  TROPONIN I  (HIGH SENSITIVITY)    EKG EKG Interpretation Date/Time:  Monday March 25 2023 10:18:49 EST Ventricular Rate:  106 PR Interval:  182 QRS Duration:  89 QT Interval:  348 QTC Calculation: 463 R Axis:   89  Text Interpretation: Sinus tachycardia Low  voltage, extremity and precordial leads Confirmed by Ruthe Cornet [phone]) on 03/25/2023 5:41:08 PM  Radiology DG Foot Complete Left Result Date: 03/25/2023 CLINICAL DATA:  Fifth toe infected wound. EXAM: LEFT FOOT - COMPLETE 3+ VIEW COMPARISON:  None Available. FINDINGS: There is no evidence of fracture or dislocation. There is no evidence of arthropathy or other focal bone abnormality. There is diffuse soft tissue swelling of the forefoot and fifth toe. IMPRESSION: Diffuse soft tissue swelling of the forefoot and fifth toe. No acute osseous abnormality. Electronically Signed   By: Greig Pique M.D.   On: 03/25/2023 18:31   CT Angio Chest PE W and/or Wo Contrast Result Date: 03/25/2023 CLINICAL DATA:  Chest pain, shortness of breath and headache. EXAM: CT ANGIOGRAPHY CHEST WITH CONTRAST TECHNIQUE: Multidetector CT imaging of the chest was performed using the standard protocol during bolus administration of intravenous contrast. Multiplanar CT image reconstructions and MIPs were obtained to evaluate the vascular anatomy. RADIATION DOSE REDUCTION: This exam was performed according to the departmental dose-optimization program which includes automated exposure control, adjustment of the mA and/or kV according to patient size and/or use of iterative reconstruction technique. CONTRAST:  75mL OMNIPAQUE  IOHEXOL  350 MG/ML SOLN COMPARISON:  12/27/2021. FINDINGS: Cardiovascular: Negative for pulmonary embolus. Aberrant right subclavian artery. Enlarged pulmonic trunk and heart. No pericardial effusion. Mediastinum/Nodes: Similar small to borderline enlarged mediastinal lymph nodes. Bihilar adenopathy measures up to approximately 1.6 cm on the right, difficult to  compare with 12/27/2021 due to lack of IV contrast on that study. No axillary adenopathy. Air-filled and mildly dilated upper esophagus. Lungs/Pleura: New patchy upper and midlung zone predominant peribronchovascular ground-glass. Tiny bilateral pleural effusions. Airway is unremarkable. Upper Abdomen: Spleen may be enlarged but is incompletely imaged. Visualized portions of the liver, adrenal glands, kidneys, spleen, pancreas, stomach and bowel are otherwise grossly unremarkable. No upper abdominal adenopathy. Musculoskeletal: None. Review of the MIP images confirms the above findings. IMPRESSION: 1. Negative for pulmonary embolus. 2. Patchy upper mid lung zone predominant peribronchovascular ground-glass, indicative an atypical or viral pneumonia. 3. Tiny bilateral 4. Chronic mild mediastinal and hilar adenopathy. Possibility of sarcoid is not excluded. Pleural effusions. 5. Spleen may be enlarged but is incompletely imaged. 6. Enlarged pulmonic trunk, indicative of pulmonary arterial hypertension. Electronically Signed   By: Newell Eke M.D.   On: 03/25/2023 18:15   DG Chest 2 View Result Date: 03/25/2023 CLINICAL DATA:  Chest pain and shortness of breath. EXAM: CHEST - 2 VIEW COMPARISON:  02/04/2023 FINDINGS: Heart size is within normal limits. Multifocal ill-defined nodular opacities are seen bilaterally which are new since previous study. No evidence of pleural effusion. IMPRESSION: Multifocal ill-defined bilateral pulmonary nodular opacities , which may be due to metastatic disease or infectious/inflammatory etiologies. Chest CT with contrast is recommended for further evaluation. Electronically Signed   By: Norleen DELENA Kil M.D.   On: 03/25/2023 10:42    Procedures .Critical Care  Performed by: Ruthe Cornet, DO Authorized by: Ruthe Cornet, DO   Critical care provider statement:    Critical care time (minutes):  35   Critical care was necessary to treat or prevent imminent or life-threatening  deterioration of the following conditions:  Sepsis   Critical care was time spent personally by me on the following activities:  Blood draw for specimens, development of treatment plan with patient or surrogate, discussions with primary provider, evaluation of patient's response to treatment, examination of patient, obtaining history from patient or surrogate, ordering and performing treatments and interventions, ordering and review  of laboratory studies, ordering and review of radiographic studies, pulse oximetry, re-evaluation of patient's condition and review of old charts   I assumed direction of critical care for this patient from another provider in my specialty: no     Care discussed with: admitting provider       Medications Ordered in ED Medications  azithromycin  (ZITHROMAX ) 500 mg in sodium chloride  0.9 % 250 mL IVPB (has no administration in time range)  cefTRIAXone  (ROCEPHIN ) 2 g in sodium chloride  0.9 % 100 mL IVPB (has no administration in time range)  sodium chloride  0.9 % bolus 1,000 mL (has no administration in time range)  iohexol  (OMNIPAQUE ) 350 MG/ML injection 75 mL (75 mLs Intravenous Contrast Given 03/25/23 1717)    ED Course/ Medical Decision Making/ A&P                                 Medical Decision Making Amount and/or Complexity of Data Reviewed Labs: ordered. Radiology: ordered.  Risk Prescription drug management. Decision regarding hospitalization.   Keerthi L Delarocha is here with cough chest pain shortness of breath, infection to her left pinky toe.  History of diabetes and neuropathy, pneumonia history.  Smoking history.  Body aches as well.  Differential diagnosis ACS versus PE versus pneumonia versus viral process.  Pinky toe likely with minor infection will get x-ray to rule out any osteomyelitis.  She has strong pulses in her feet.  I have no concern for blood clot or peripheral arterial disease.  Overall she will get CBC BMP troponin chest x-ray CT  scan of chest.  Overall checks x-ray concerning for multifocal pneumonia.  She is not tachycardic.  She has a white count of 13.  Now concerned more for sepsis.  I got a CT scan of the chest that did confirm an atypical may be multifocal pneumonia.  Given white count tachycardia and infectious process she does meet sepsis criteria.  Will add blood cultures lactic acid IV antibiotics.  She also had an x-ray of the left pinky toe that was without signs of osteomyelitis.  I think she has a mild cellulitis in this area.  Overall we will admit her for further care.  She is on immunologic for sarcoid/rheumatoid arthritis she thinks.  I think she is high risk for may be an atypical infectious process.  Overall lab work was reassuring.  Will admit to medicine for further care.  This chart was dictated using voice recognition software.  Despite best efforts to proofread,  errors can occur which can change the documentation meaning.         Final Clinical Impression(s) / ED Diagnoses Final diagnoses:  Multifocal pneumonia  Cellulitis of toe of left foot  Sepsis, due to unspecified organism, unspecified whether acute organ dysfunction present [facility])    Rx / DC Orders ED Discharge Orders     None         Ruthe Cornet, DO 03/25/23 1930    Ruthe Cornet, DO 03/25/23 1931

## 2023-03-25 NOTE — ED Notes (Signed)
 Patient transported to X-ray

## 2023-03-25 NOTE — H&P (Addendum)
 [facility] History and Physical  Melinda Stone FMW:[id number] DOB: [birth year] DOA: 03/25/2023  Referring physician: ED  PCP: [name], NP   Patient is coming from: Home   Chief Complaint: shortness of breath  HPI:    Patient is a 43 years old female with past medical history of chronic back pain, diabetes mellitus, GERD, pneumonia presented to hospital with complains of cough congestion with yellowish productive phlegm for last 4 days.  She also reports swelling of her lower extremities with some chest discomfort while coughing and shortness of breath for the same duration.  Complains of generalized body ache and pain.  Denies any fever.  Continues to smoke 2 cigarettes a day.complains of lower abdominal pain with urinary discomfort..  Stated that she did have nausea and 1 episode of vomiting.  Has low appetite.  Patient also complained of left fifth toe swelling and ulceration recently.  Complains of burning numbness and tingling of her lower extremities chronically.  Has not had bowel movement in couple of days.  Has history of back pain and herniated disc with her neuropathy and takes pain medications.  In the ED, patient was slightly tachycardic but afebrile.  Lab was notable for a mild leukocytosis at 13.6 with hemoglobin of 9.7.  Creatinine was 0.5.  Pregnancy test was negative.  Influenza COVID and RSV was negative.  Lactic acid was negative.  Chest x-ray done in the ED showed multifocal ill-defined bilateral pulmonary nodular opacities.  CTA of the chest was done which was negative for pulmonary embolism but patchy upper mid lung zone predominant perivascular groundglass opacity indicative of atypical or viral pneumonia with chronic mild midsternal and hilar lymphadenopathy.  Possibility of sarcoid not excluded.  X-ray of the left foot showed diffuse soft tissue abnormality around the fifth toe without any acute osseous abnormality.  Patient was then considered for  admission to the hospital for further evaluation and treatment.  Assessment and Plan  Principal Problem:   Multifocal pneumonia Active Problems:   Failed back surgical syndrome   History of marijuana use   History of illicit drug use   Tobacco use    Possible community-acquired multifocal pneumonia.  History of asthma.  Patient was tachycardic with leukocytosis and abnormal chest x-ray and CT scan.  Continue with Rocephin  and Zithromax .  Follow blood cultures.  Check procalcitonin, check strep urinary antigen, Legionella antigen.  Continue incentive spirometry.  Will get respiratory viral panel.  Possible COPD.  History of asthma as per the patient.  Continues to smoke.  Will continue with bronchodilators.  Lower abdominal pain urinary symptoms.  Will get UA.  Patient states that she might have had UTI.  Already on IV Rocephin .  Arthritis as per the patient.  Has chronic back pain and neuropathy.  Takes Celebrex  oxycodone  at home.  Continue oxycodone .  Med rec still pending.  Diabetes mellitus type 2.  Patient is on NovoLog  Lantus  and semaglutide  at home.  Med rec pending.  Will start the patient on sliding scale insulin .  Generalized weakness, fatigue, debility.  Will get PT evaluation  Tobacco use disorder.   History of substance abuse.  Smokes 2 cigarettes a day as per the patient.  Counseled about cessation of smoking.  Does not wish to be on nicotine  patch.  Will get urine drug screen.  Superficial left fifth toe ulceration.  In the context of diabetes will get wound care consultation.  X-ray of the foot without any osteomyelitis.  Grade 1 obesity. Body mass  index is 31.75 kg/m.  Would benefit from weight loss as outpatient.   DVT Prophylaxis: Lovenox  subcu  Review of Systems:  All systems were reviewed and were negative unless otherwise mentioned in the HPI   Past Medical History:  Diagnosis Date   Anemia    Arthritis 08/17/2020   Chronic back pain    Diabetes  mellitus without complication (HCC)    type 2   GERD (gastroesophageal reflux disease)    Migraine    Ovarian cyst    Pneumonia    Polyneuropathy    Past Surgical History:  Procedure Laterality Date   CESAREAN SECTION     CHOLECYSTECTOMY  2004   LUMBAR LAMINECTOMY N/A 05/30/2020   Procedure: Lumbar five Laminectomy,  Bilateral Microdiscectomy, Left Lumbar five -Sacral one Microdiscectomy;  Surgeon: Barbarann Oneil BROCKS, MD;  Location: [facility] OR;  Service: Orthopedics;  Laterality: N/A;   LUMBAR LAMINECTOMY Left 11/14/2020   Procedure: LEFT LUMBAR FOUR-FIVE MICRODISCECTOMY;  Surgeon: Barbarann Oneil BROCKS, MD;  Location: [facility] OR;  Service: Orthopedics;  Laterality: Left;   TUBAL LIGATION  10/19/2010    Social History:  reports that she has been smoking cigars. She has never used smokeless tobacco. She reports that she does not currently use alcohol . She reports that she does not currently use drugs.  Allergies  Allergen Reactions   Trulicity  [Dulaglutide ] Diarrhea    Family History  Problem Relation Age of Onset   Diabetes Mother    Hypertension Mother    Stroke Mother      Prior to Admission medications   Medication Sig Start Date End Date Taking? Authorizing Provider  acetaminophen  (TYLENOL ) 500 MG tablet Take 500 mg by mouth every 6 (six) hours as needed for mild pain or moderate pain.    [provider]  amitriptyline  (ELAVIL ) 25 MG tablet Take 1 tablet (25 mg total) by mouth at bedtime. 01/31/23   Gayland Lauraine PARAS, NP  celecoxib  (CELEBREX ) 100 MG capsule Take 100 mg by mouth 2 (two) times daily as needed. 01/21/23   [provider]  cyanocobalamin  (VITAMIN B12) 1000 MCG tablet Take 1 tablet (1,000 mcg total) by mouth daily. 04/10/22   Babara Call, MD  gabapentin  (NEURONTIN ) 800 MG tablet Take 1 tablet (800 mg total) by mouth in the morning, at noon, in the evening, and at bedtime for 10 days. 01/06/23 01/16/23  Acevedo, Angela, PA  Galcanezumab -gnlm (EMGALITY ) 120 MG/ML SOAJ Inject  120 mg into the skin every 30 (thirty) days. 01/31/23   Gayland Lauraine PARAS, NP  Galcanezumab -gnlm (EMGALITY ) 120 MG/ML SOAJ Inject 240 mg into the skin every 30 (thirty) days. 01/31/23   Gayland Lauraine PARAS, NP  insulin  aspart (NOVOLOG ) 100 UNIT/ML FlexPen Before each meal 3 times a day, 140-199 - 2 units, 200-250 - 4 units, 251-299 - 8 units,  300-349 - 10 units,  350 or above 12 units. 12/27/22   Singh, Prashant K, MD  insulin  glargine (LANTUS  SOLOSTAR) 100 UNIT/ML Solostar Pen Inject 30 Units into the skin at bedtime. 12/27/22   Singh, Prashant K, MD  methocarbamol  (ROBAXIN ) 500 MG tablet Take 1 tablet (500 mg total) by mouth 2 (two) times daily. 01/12/23   Beverley Leita LABOR, PA-C  metoCLOPramide  (REGLAN ) 10 MG tablet Take 1 tablet (10 mg total) by mouth every 6 (six) hours. 02/09/23   Laurice Maude BROCKS, MD  metoprolol  tartrate (LOPRESSOR ) 25 MG tablet Take 1 tablet (25 mg total) by mouth 2 (two) times daily. 12/27/22   Singh, Prashant K,  MD  ondansetron  (ZOFRAN ) 4 MG tablet Take 1 tablet (4 mg total) by mouth every 6 (six) hours. 02/09/23   Laurice Maude BROCKS, MD  ondansetron  (ZOFRAN -ODT) 4 MG disintegrating tablet Take 1 tablet (4 mg total) by mouth every 8 (eight) hours as needed for nausea or vomiting. 02/04/23   Rolinda Rogue, MD  oxyCODONE -acetaminophen  (PERCOCET/ROXICET) 5-325 MG tablet Take 1 tablet by mouth every 6 (six) hours as needed for moderate pain or severe pain. 03/13/21   [provider]  pantoprazole  (PROTONIX ) 40 MG tablet Take 1 tablet (40 mg total) by mouth daily. 06/12/21   Will Almarie MATSU, MD  Semaglutide ,0.25 or 0.5MG /DOS, 2 MG/3ML SOPN Inject 0.5 mg into the skin once a week. 10/16/22   Shamleffer, Ibtehal Jaralla, MD  Ubrogepant  (UBRELVY ) 100 MG TABS Take 1 tablet (100 mg total) by mouth as needed. Take 1 tablet at onset of headache, may repeat in 2 hours if needed. Max is 200 mg in 24 hours. 01/31/23   Gayland Lauraine PARAS, NP  vitamin C  (ASCORBIC ACID ) 250 MG tablet Take 2  tablets (500 mg total) by mouth daily. Patient taking differently: Take 250 mg by mouth daily. 04/10/22   Babara Call, MD    Physical Exam: Vitals:   03/25/23 1635 03/25/23 1730 03/25/23 1815 03/25/23 1927  BP: 136/82 132/84 (!) 166/99 (!) 141/70  Pulse: (!) 101 (!) 107 (!) 108 (!) 113  Resp: 17 13 16    Temp: 98.9 F (37.2 C)   98.8 F (37.1 C)  TempSrc: Oral     SpO2: 94% 100% 97% 99%  Weight:      Height:       Wt Readings from Last 3 Encounters:  03/25/23 97.5 kg  02/12/23 89.4 kg  01/31/23 89.4 kg   Body mass index is 31.75 kg/m.  General: Obese built, not in obvious distress, appears weak and deconditioned HENT: Normocephalic, No scleral pallor or icterus noted. Oral mucosa is moist.  Chest: Diminished breath sounds bilaterally, coarse breath sounds noted CVS: S1 &S2 heard. No murmur.  Regular rate and rhythm. Abdomen: Soft, nontender, nondistended.  Bowel sounds are heard. No abdominal mass palpated Extremities: No cyanosis, clubbing, trace edema, left fifth toe with superficial area of ulceration..  Peripheral pulses are palpable. Psych: Alert, awake and oriented, normal mood CNS:  No cranial nerve deficits.  Power equal in all extremities.  Generalized weakness noted. Skin: Warm and dry.  Left fifth toe with area of ulceration.  Labs on Admission:   CBC: Recent Labs  Lab 03/25/23 1307  WBC 13.6*  HGB 9.7*  HCT 27.7*  MCV 81.0  PLT 166    Basic Metabolic Panel: Recent Labs  Lab 03/25/23 1307  NA 135  K 4.8  CL 109  CO2 18*  GLUCOSE 188*  BUN 14  CREATININE 0.58  CALCIUM  8.6*    Liver Function Tests: No results for input(s): AST, ALT, ALKPHOS, BILITOT, PROT, ALBUMIN in the last 168 hours. No results for input(s): LIPASE, AMYLASE in the last 168 hours. No results for input(s): AMMONIA in the last 168 hours.  Cardiac Enzymes: No results for input(s): CKTOTAL, CKMB, CKMBINDEX, TROPONINI in the last 168 hours.  BNP (last 3  results) Recent Labs    05/13/22 0415  BNP 166.1*    ProBNP (last 3 results) No results for input(s): PROBNP in the last 8760 hours.  CBG: No results for input(s): GLUCAP in the last 168 hours.  Lipase     Component Value Date/Time  LIPASE 38 02/09/2023 1216     Urinalysis    Component Value Date/Time   COLORURINE AMBER (A) 02/09/2023 1210   APPEARANCEUR HAZY (A) 02/09/2023 1210   LABSPEC 1.021 02/09/2023 1210   PHURINE 5.0 02/09/2023 1210   GLUCOSEU >=500 (A) 02/09/2023 1210   HGBUR SMALL (A) 02/09/2023 1210   BILIRUBINUR NEGATIVE 02/09/2023 1210   BILIRUBINUR small (A) 02/04/2023 1027   KETONESUR NEGATIVE 02/09/2023 1210   PROTEINUR 100 (A) 02/09/2023 1210   UROBILINOGEN 0.2 02/04/2023 1027   NITRITE NEGATIVE 02/09/2023 1210   LEUKOCYTESUR NEGATIVE 02/09/2023 1210     Drugs of Abuse     Component Value Date/Time   LABOPIA NONE DETECTED 12/24/2022 1522   COCAINSCRNUR NONE DETECTED 12/24/2022 1522   LABBENZ NONE DETECTED 12/24/2022 1522   AMPHETMU NONE DETECTED 12/24/2022 1522   THCU NONE DETECTED 12/24/2022 1522   LABBARB NONE DETECTED 12/24/2022 1522      Radiological Exams on Admission: DG Foot Complete Left Result Date: 03/25/2023 CLINICAL DATA:  Fifth toe infected wound. EXAM: LEFT FOOT - COMPLETE 3+ VIEW COMPARISON:  None Available. FINDINGS: There is no evidence of fracture or dislocation. There is no evidence of arthropathy or other focal bone abnormality. There is diffuse soft tissue swelling of the forefoot and fifth toe. IMPRESSION: Diffuse soft tissue swelling of the forefoot and fifth toe. No acute osseous abnormality. Electronically Signed   By: Greig Pique M.D.   On: 03/25/2023 18:31   CT Angio Chest PE W and/or Wo Contrast Result Date: 03/25/2023 CLINICAL DATA:  Chest pain, shortness of breath and headache. EXAM: CT ANGIOGRAPHY CHEST WITH CONTRAST TECHNIQUE: Multidetector CT imaging of the chest was performed using the standard protocol during  bolus administration of intravenous contrast. Multiplanar CT image reconstructions and MIPs were obtained to evaluate the vascular anatomy. RADIATION DOSE REDUCTION: This exam was performed according to the departmental dose-optimization program which includes automated exposure control, adjustment of the mA and/or kV according to patient size and/or use of iterative reconstruction technique. CONTRAST:  75mL OMNIPAQUE  IOHEXOL  350 MG/ML SOLN COMPARISON:  12/27/2021. FINDINGS: Cardiovascular: Negative for pulmonary embolus. Aberrant right subclavian artery. Enlarged pulmonic trunk and heart. No pericardial effusion. Mediastinum/Nodes: Similar small to borderline enlarged mediastinal lymph nodes. Bihilar adenopathy measures up to approximately 1.6 cm on the right, difficult to compare with 12/27/2021 due to lack of IV contrast on that study. No axillary adenopathy. Air-filled and mildly dilated upper esophagus. Lungs/Pleura: New patchy upper and midlung zone predominant peribronchovascular ground-glass. Tiny bilateral pleural effusions. Airway is unremarkable. Upper Abdomen: Spleen may be enlarged but is incompletely imaged. Visualized portions of the liver, adrenal glands, kidneys, spleen, pancreas, stomach and bowel are otherwise grossly unremarkable. No upper abdominal adenopathy. Musculoskeletal: None. Review of the MIP images confirms the above findings. IMPRESSION: 1. Negative for pulmonary embolus. 2. Patchy upper mid lung zone predominant peribronchovascular ground-glass, indicative an atypical or viral pneumonia. 3. Tiny bilateral 4. Chronic mild mediastinal and hilar adenopathy. Possibility of sarcoid is not excluded. Pleural effusions. 5. Spleen may be enlarged but is incompletely imaged. 6. Enlarged pulmonic trunk, indicative of pulmonary arterial hypertension. Electronically Signed   By: Newell Eke M.D.   On: 03/25/2023 18:15   DG Chest 2 View Result Date: 03/25/2023 CLINICAL DATA:  Chest pain and  shortness of breath. EXAM: CHEST - 2 VIEW COMPARISON:  02/04/2023 FINDINGS: Heart size is within normal limits. Multifocal ill-defined nodular opacities are seen bilaterally which are new since previous study. No evidence of pleural effusion.  IMPRESSION: Multifocal ill-defined bilateral pulmonary nodular opacities , which may be due to metastatic disease or infectious/inflammatory etiologies. Chest CT with contrast is recommended for further evaluation. Electronically Signed   By: Norleen DELENA Kil M.D.   On: 03/25/2023 10:42    EKG: Personally reviewed by me which shows sinus tachycardia   Consultant: None  Code Status: Full code  Microbiology none  Antibiotics: Rocephin  and Zithromax   Family Communication:  Patients' condition and plan of care including tests being ordered have been discussed with the patient and the patient's daughter at bedside who indicate understanding and agree with the plan.   Status is: Observation The patient remains OBS appropriate and will d/c before 2 midnights.   Severity of Illness: The appropriate patient status for this patient is OBSERVATION. Observation status is judged to be reasonable and necessary in order to provide the required intensity of service to ensure the patient's safety. The patient's presenting symptoms, physical exam findings, and initial radiographic and laboratory data in the context of their medical condition is felt to place them at decreased risk for further clinical deterioration. Furthermore, it is anticipated that the patient will be medically stable for discharge from the hospital within 2 midnights of admission.   Signed, Vernal Alstrom, MD [facility] 03/25/2023

## 2023-03-25 NOTE — Sepsis Progress Note (Signed)
 Following for sepsis monitoring ?

## 2023-03-25 NOTE — ED Triage Notes (Signed)
 Pt arrived via POV. C/o chest pain, SOB, and a HA for 3x days. Pt also has infected wound on L pinky toe  AOx4

## 2023-03-25 NOTE — ED Notes (Signed)
 Unable to obtain labs. Pt sts she usually requires ultrasound IV

## 2023-03-26 ENCOUNTER — Encounter (HOSPITAL_COMMUNITY): Payer: Self-pay | Admitting: Family Medicine

## 2023-03-26 DIAGNOSIS — I2721 Secondary pulmonary arterial hypertension: Secondary | ICD-10-CM | POA: Diagnosis not present

## 2023-03-26 DIAGNOSIS — D509 Iron deficiency anemia, unspecified: Secondary | ICD-10-CM | POA: Diagnosis not present

## 2023-03-26 DIAGNOSIS — E11621 Type 2 diabetes mellitus with foot ulcer: Secondary | ICD-10-CM | POA: Diagnosis not present

## 2023-03-26 DIAGNOSIS — L03032 Cellulitis of left toe: Secondary | ICD-10-CM | POA: Diagnosis not present

## 2023-03-26 DIAGNOSIS — Z1152 Encounter for screening for COVID-19: Secondary | ICD-10-CM | POA: Diagnosis not present

## 2023-03-26 DIAGNOSIS — J4520 Mild intermittent asthma, uncomplicated: Secondary | ICD-10-CM

## 2023-03-26 DIAGNOSIS — J189 Pneumonia, unspecified organism: Secondary | ICD-10-CM | POA: Diagnosis not present

## 2023-03-26 DIAGNOSIS — K219 Gastro-esophageal reflux disease without esophagitis: Secondary | ICD-10-CM | POA: Diagnosis not present

## 2023-03-26 DIAGNOSIS — N920 Excessive and frequent menstruation with regular cycle: Secondary | ICD-10-CM | POA: Diagnosis not present

## 2023-03-26 DIAGNOSIS — Z794 Long term (current) use of insulin: Secondary | ICD-10-CM | POA: Diagnosis not present

## 2023-03-26 DIAGNOSIS — J129 Viral pneumonia, unspecified: Secondary | ICD-10-CM | POA: Diagnosis not present

## 2023-03-26 DIAGNOSIS — G43909 Migraine, unspecified, not intractable, without status migrainosus: Secondary | ICD-10-CM | POA: Diagnosis not present

## 2023-03-26 DIAGNOSIS — D508 Other iron deficiency anemias: Secondary | ICD-10-CM

## 2023-03-26 DIAGNOSIS — L97529 Non-pressure chronic ulcer of other part of left foot with unspecified severity: Secondary | ICD-10-CM | POA: Diagnosis not present

## 2023-03-26 DIAGNOSIS — Z888 Allergy status to other drugs, medicaments and biological substances status: Secondary | ICD-10-CM | POA: Diagnosis not present

## 2023-03-26 DIAGNOSIS — E1142 Type 2 diabetes mellitus with diabetic polyneuropathy: Secondary | ICD-10-CM | POA: Diagnosis not present

## 2023-03-26 DIAGNOSIS — N3000 Acute cystitis without hematuria: Secondary | ICD-10-CM | POA: Diagnosis not present

## 2023-03-26 DIAGNOSIS — M961 Postlaminectomy syndrome, not elsewhere classified: Secondary | ICD-10-CM | POA: Diagnosis not present

## 2023-03-26 DIAGNOSIS — M5416 Radiculopathy, lumbar region: Secondary | ICD-10-CM | POA: Diagnosis not present

## 2023-03-26 DIAGNOSIS — A419 Sepsis, unspecified organism: Principal | ICD-10-CM | POA: Insufficient documentation

## 2023-03-26 DIAGNOSIS — L03039 Cellulitis of unspecified toe: Secondary | ICD-10-CM

## 2023-03-26 DIAGNOSIS — Z6831 Body mass index (BMI) 31.0-31.9, adult: Secondary | ICD-10-CM | POA: Diagnosis not present

## 2023-03-26 DIAGNOSIS — F17290 Nicotine dependence, other tobacco product, uncomplicated: Secondary | ICD-10-CM | POA: Diagnosis not present

## 2023-03-26 DIAGNOSIS — G894 Chronic pain syndrome: Secondary | ICD-10-CM | POA: Diagnosis not present

## 2023-03-26 DIAGNOSIS — F17210 Nicotine dependence, cigarettes, uncomplicated: Secondary | ICD-10-CM | POA: Diagnosis not present

## 2023-03-26 DIAGNOSIS — Z8249 Family history of ischemic heart disease and other diseases of the circulatory system: Secondary | ICD-10-CM | POA: Diagnosis not present

## 2023-03-26 DIAGNOSIS — E66811 Obesity, class 1: Secondary | ICD-10-CM | POA: Diagnosis not present

## 2023-03-26 DIAGNOSIS — N390 Urinary tract infection, site not specified: Secondary | ICD-10-CM | POA: Diagnosis not present

## 2023-03-26 LAB — CBC
HCT: 22.3 % — ABNORMAL LOW (ref 36.0–46.0)
Hemoglobin: 7.8 g/dL — ABNORMAL LOW (ref 12.0–15.0)
MCH: 28.6 pg (ref 26.0–34.0)
MCHC: 35 g/dL (ref 30.0–36.0)
MCV: 81.7 fL (ref 80.0–100.0)
Platelets: 155 10*3/uL (ref 150–400)
RBC: 2.73 MIL/uL — ABNORMAL LOW (ref 3.87–5.11)
RDW: 19.9 % — ABNORMAL HIGH (ref 11.5–15.5)
WBC: 10.2 10*3/uL (ref 4.0–10.5)
nRBC: 0 % (ref 0.0–0.2)

## 2023-03-26 LAB — RESPIRATORY PANEL BY PCR

## 2023-03-26 LAB — BASIC METABOLIC PANEL
Anion gap: 7 (ref 5–15)
BUN: 15 mg/dL (ref 6–20)
CO2: 18 mmol/L — ABNORMAL LOW (ref 22–32)
Calcium: 8 mg/dL — ABNORMAL LOW (ref 8.9–10.3)
Chloride: 107 mmol/L (ref 98–111)
Creatinine, Ser: 0.72 mg/dL (ref 0.44–1.00)
GFR, Estimated: >60 mL/min (ref >60)
Glucose, Bld: 288 mg/dL — ABNORMAL HIGH (ref 70–99)
Potassium: 4 mmol/L (ref 3.5–5.1)
Sodium: 132 mmol/L — ABNORMAL LOW (ref 135–145)

## 2023-03-26 LAB — URINALYSIS, ROUTINE W REFLEX MICROSCOPIC
Bacteria, UA: NONE SEEN
Bilirubin Urine: NEGATIVE
Glucose, UA: >=500 mg/dL — AB
Ketones, ur: NEGATIVE mg/dL
Nitrite: NEGATIVE
Protein, ur: 100 mg/dL — AB
RBC / HPF: >50 RBC/hpf (ref 0–5)
Specific Gravity, Urine: 1.023 (ref 1.005–1.030)
WBC, UA: >50 WBC/hpf (ref 0–5)
pH: 5 (ref 5.0–8.0)

## 2023-03-26 LAB — STREP PNEUMONIAE URINARY ANTIGEN: Strep Pneumo Urinary Antigen: NEGATIVE

## 2023-03-26 LAB — GLUCOSE, CAPILLARY
Glucose-Capillary: 238 mg/dL — ABNORMAL HIGH (ref 70–99)
Glucose-Capillary: 221 mg/dL — ABNORMAL HIGH (ref 70–99)

## 2023-03-26 LAB — RAPID URINE DRUG SCREEN, HOSP PERFORMED
Amphetamines: NOT DETECTED
Barbiturates: NOT DETECTED
Benzodiazepines: NOT DETECTED
Cocaine: NOT DETECTED
Opiates: NOT DETECTED
Tetrahydrocannabinol: NOT DETECTED

## 2023-03-26 LAB — CBG MONITORING, ED
Glucose-Capillary: 300 mg/dL — ABNORMAL HIGH (ref 70–99)
Glucose-Capillary: 295 mg/dL — ABNORMAL HIGH (ref 70–99)

## 2023-03-26 LAB — MAGNESIUM: Magnesium: 2.1 mg/dL (ref 1.7–2.4)

## 2023-03-26 MED ORDER — GABAPENTIN 400 MG PO CAPS
800.0000 mg | ORAL_CAPSULE | Freq: Three times a day (TID) | ORAL | Status: DC
  Administered 2023-03-26 – 2023-03-27 (×5): 800 mg via ORAL
  Filled 2023-03-26 (×5): qty 2

## 2023-03-26 MED ORDER — SUMATRIPTAN SUCCINATE 25 MG PO TABS
25.0000 mg | ORAL_TABLET | Freq: Once | ORAL | Status: AC
  Administered 2023-03-26: 25 mg via ORAL
  Filled 2023-03-26: qty 1

## 2023-03-26 MED ORDER — INSULIN GLARGINE-YFGN 100 UNIT/ML ~~LOC~~ SOLN
15.0000 [IU] | Freq: Every day | SUBCUTANEOUS | Status: DC
  Administered 2023-03-26 – 2023-03-27 (×2): 15 [IU] via SUBCUTANEOUS
  Filled 2023-03-26 (×2): qty 0.15

## 2023-03-26 NOTE — Consult Note (Signed)
 WOC Nurse Consult Note: patient is a diabetic with neuropathy, denies any feeling in her toes when performing wound care  Reason for Consult: 5th toe ulcer  Wound type: full thickness unknown etiology, patient says she noticed it 3 days ago  Pressure Injury POA: NA  Measurement: 1 cm x 0.3 cm x 0.2 cm  Wound bed: 50% pink 50% tan fibrinous tissue  Drainage (amount, consistency, odor) minimal tan exudate  Periwound: intact  Dressing procedure/placement/frequency: Cleanse L 5th digit wound with Vashe wound cleanser Soila [phone]), cut a strip of silver hydrofiber Soila [phone]) to fit onto wound bed and in between 4th and 5th toes. May secure with silicone foam or gauze and tape whichever is preferred.   I stressed to patient the importance of following up with podiatry after discharge as she is a diabetic with altered sensation in feet.  Patient voided understanding.   POC discussed with primary MD and bedside nurse.  WOC team will not follow. Re-consult if further needs arise.   Thank you,     Powell Bar MSN, RN-BC, [employer] [phone]

## 2023-03-26 NOTE — Inpatient Diabetes Management (Signed)
 Inpatient Diabetes Program Recommendations  AACE/ADA: New Consensus Statement on Inpatient Glycemic Control (2015)  Target Ranges:  Prepandial:   less than 140 mg/dL      Peak postprandial:   less than 180 mg/dL (1-2 hours)      Critically ill patients:  140 - 180 mg/dL   Lab Results  Component Value Date   GLUCAP 300 (H) 03/26/2023   HGBA1C 7.9 (A) 10/16/2022    Review of Glycemic Control  Latest Reference Range & Units 03/25/23 23:29 03/26/23 08:03  Glucose-Capillary 70 - 99 mg/dL 715 (H) 699 (H)  (H): Data is abnormally high  Diabetes history: DM2 Outpatient Diabetes medications: Lantus  30 units every day, Novolog  0-12 units TID, Ozempic  (not taking) Current orders for Inpatient glycemic control: Novolog  0-15 units TID and 0-5 units QHS  Inpatient Diabetes Program Recommendations:    Semglee  15 units every day (50% of home dose) Obtain current A1C  Will continue to follow while inpatient.  Thank you, Wyvonna Pinal, MSN, [facility] Diabetes Coordinator Inpatient Diabetes Program [phone] (team pager from 8a-5p)

## 2023-03-26 NOTE — Hospital Course (Addendum)
 Melinda Stone is a 43 year old female with diabetes, chronic left fifth toe ulceration, diabetic peripheral neuropathy, tobacco use disorder, history of substance abuse, iron  deficiency anemia, menorrhagia, grade 1 obesity, chronic migraine headaches, lumbar radiculopathy, postlaminectomy syndrome, and chronic pain syndrome who presents to the ED complaining of cough.  Patient was found to be septic with possible UTI and cellulitis surrounding left fifth toe.  Chest CT was negative for PE but concerning for possible atypical or viral pneumonia.  Patient was initiated on antibiotics for community-acquired pneumonia, ultimately procalcitonin was negative and thus Zithromax  was discontinued.  She was continued on ceftriaxone  for cellulitis and UTI.  UA was consistent with UTI though no urine culture was sent.  Patient completed 3 days of empiric ceftriaxone  therapy.  Patient was found to have a left fifth toe ulceration and cellulitis, x-ray was negative for any acute findings or concerning signs of osteomyelitis.  Patient's surrounding skin findings improved with antibiotic therapy.  She reports she is already established with a podiatrist that she has an appointment with later this month.  She will need to continue Augmentin  for an additional 4 days at discharge to treat this wound. Patient also has a history of iron  deficiency anemia and receives IV iron  and B12 supplementation outpatient.  Hemoglobin down trended during this admission.  Patient is currently menstruating and reports that due to her menorrhagia she has required blood transfusions in the past.  Hemoglobin was found to be 6.4 on 1/8 and patient received 1 unit PRBC with subsequent rise in hemoglobin to 7.5.  Patient endorsed feeling improved and requesting for discharge home. She will need to follow-up with her specialist team including podiatry, gynecology, and pulmonology.   Of note, on CT angio on arrival the patient was found to have small to  borderline enlarged mediastinal lymph nodes as well as bihilar adenopathy.  It was suggested that these are chronic findings with the possibility of sarcoid.  A referral has been placed to pulmonology.  She needs to follow-up at discharge

## 2023-03-26 NOTE — Evaluation (Signed)
 Physical Therapy Evaluation Patient Details Name: Melinda Stone MRN: [id number] DOB: 09/14/1980 Today's Date: 03/26/2023  History of Present Illness  43 yo female presented to ED on 03/25/2023 due to productive cough x 4 days, SOB, B LE edema and abn sensation, poor PO intake, constipation,  abdominal and urinary discomfort as well as L 5th toe edema and ulceration. Pt was found to have sepsis due to PNA, UTI and L 5th toe infection suspect for cellulitis.  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  Clinical Impression   Pt admitted with above diagnosis.  Pt currently with functional limitations due to the deficits listed below (see PT Problem List). Pt in bed when PT arrived. Pt indicated no acute pain, hx of chronic LBP, no SOB or dizziness. Pt is mod I for bed mobility and transfer tasks, gait no AD 50 feet x 2 with S min min cues. Pt will benefit from acute skilled PT to increase their independence and safety with mobility to allow discharge.         If plan is discharge home, recommend the following: Assistance with cooking/housework;Assist for transportation   Can travel by private vehicle        Equipment Recommendations None recommended by PT  Recommendations for Other Services       Functional Status Assessment Patient has had a recent decline in their functional status and demonstrates the ability to make significant improvements in function in a reasonable and predictable amount of time.     Precautions / Restrictions Precautions Precautions: Fall      Mobility  Bed Mobility Overal bed mobility: Modified Independent             General bed mobility comments: HOB slightly elevated    Transfers Overall transfer level: Needs assistance Equipment used: None Transfers: Sit to/from Stand Sit to Stand: Modified independent (Device/Increase time)            General transfer comment: pt able to perform bed and commode transfers mod I no AD    Ambulation/Gait Ambulation/Gait assistance: Supervision Gait Distance (Feet): 50 Feet Assistive device: None Gait Pattern/deviations: Step-through pattern, Narrow base of support Gait velocity: decreased     General Gait Details: lateral sway, wide BOS and slight B toe out no overt instabiltiy noted with gait tasks no AD 50 x 2 no indication of increased pain, dizziness or SOB  Stairs            Wheelchair Mobility     Tilt Bed    Modified Rankin (Stroke Patients Only)       Balance Overall balance assessment: No apparent balance deficits (not formally assessed) (pt able to maintain static and dynamic balance no UE support, turn, navigate obstacles in room and reach outside BOS with S)                                           Pertinent Vitals/Pain Pain Assessment Pain Assessment: 0-10 Pain Score: 4  Pain Location: chronic LBP Pain Descriptors / Indicators: Aching, Constant, Discomfort, Dull Pain Intervention(s): Limited activity within patient's tolerance, Monitored during session, Repositioned    Home Living Family/patient expects to be discharged to:: Private residence Living Arrangements: Children;Other relatives Available Help at Discharge: Family Type of Home: Apartment Home Access:  Level entry       Home Layout: One level Home Equipment: Cane - single point      Prior Function Prior Level of Function : Independent/Modified Independent             Mobility Comments: Mod I with intermittent use of SPC, pt states she does not want to be reliant on the cane. uses a cart at the grocery store. limited community distance secondary to chronic pain ADLs Comments: mod I     Extremity/Trunk Assessment        Lower Extremity Assessment Lower Extremity Assessment: Overall WFL for tasks assessed    Cervical / Trunk Assessment Cervical /  Trunk Assessment: Back Surgery  Communication   Communication Communication: No apparent difficulties  Cognition Arousal: Alert Behavior During Therapy: WFL for tasks assessed/performed Overall Cognitive Status: Within Functional Limits for tasks assessed                                          General Comments      Exercises     Assessment/Plan    PT Assessment Patient needs continued PT services  PT Problem List Decreased activity tolerance;Pain       PT Treatment Interventions Gait training;Functional mobility training;Therapeutic activities;Therapeutic exercise;Neuromuscular re-education;Patient/family education    PT Goals (Current goals can be found in the Care Plan section)  Acute Rehab PT Goals Patient Stated Goal: to be able to get back to what I was doing before and spend time with 2 yo granddaughter PT Goal Formulation: With patient Time For Goal Achievement: 04/09/23 Potential to Achieve Goals: Good    Frequency Min 1X/week     Co-evaluation               AM-PAC PT 6 Clicks Mobility  Outcome Measure Help needed turning from your back to your side while in a flat bed without using bedrails?: None Help needed moving from lying on your back to sitting on the side of a flat bed without using bedrails?: None Help needed moving to and from a bed to a chair (including a wheelchair)?: None Help needed standing up from a chair using your arms (e.g., wheelchair or bedside chair)?: None Help needed to walk in hospital room?: A Little Help needed climbing 3-5 steps with a railing? : A Lot 6 Click Score: 21    End of Session Equipment Utilized During Treatment: Gait belt Activity Tolerance: Patient tolerated treatment well;No increased pain Patient left: in bed;with call bell/phone within reach Nurse Communication: Mobility status PT Visit Diagnosis: Unsteadiness on feet (R26.81);Other abnormalities of gait and mobility (R26.89);Pain Pain  - part of body:  (back)    Time: 8944-8880 PT Time Calculation (min) (ACUTE ONLY): 24 min   Charges:   PT Evaluation $PT Eval Low Complexity: 1 Low PT Treatments $Gait Training: 8-22 mins PT General Charges $$ ACUTE PT VISIT: 1 Visit         [place], PT Acute Rehab   [place] VEAR Drone 03/26/2023, 1:19 PM

## 2023-03-26 NOTE — Plan of Care (Signed)
 Progressing well after getting to unit.

## 2023-03-26 NOTE — Progress Notes (Signed)
 Progress Note   Patient: Melinda Stone FMW:[id number] DOB: 1981/02/07 DOA: 03/25/2023     0 DOS: the patient was seen and examined on 03/26/2023   Brief hospital course: 43 year old woman PMH including diabetes mellitus type 2 presenting with cough.  Was admitted for sepsis, viral pneumonia, possible UTI and cellulitis left fifth toe.  Consultants None   Procedures/Events None   Assessment and Plan: Sepsis Community-acquired viral pneumonia Mild intermittent asthma last seen by Dr. Tamea Sepsis resolved.  Viral pneumonia now appears asymptomatic.  Asthma appears to be at baseline.   Procalcitonin negative.  Discontinue Zithromax .  Will continue ceftriaxone  given cellulitis and UTI.   CT chest negative for PE.  Patchy groundglass opacities indicative of atypical or viral pneumonia.  Chronic adenopathy consider sarcoid.  Enlarged pulmonary trunk indicative of pulmonary artery hypertension. Recommend outpatient follow-up with pulmonology.   UTI Unfortunately no urine culture sent.  Treat 3 days with empiric therapy.  Cellulitis left fifth toe complicated by diabetic peripheral neuropathy Superficial left fifth toe ulceration X-ray negative for osteomyelitis or complicating features.  Continue empiric therapy.  Close outpatient follow-up to resolution.   Diabetes mellitus type 2 Chronic diabetic neuropathic pain CBG poor control inpatient, will restart Lantus .  Continue sliding scale insulin .  Resume gabapentin .   Tobacco use disorder.   History of substance abuse Urine drug screen negative.  Recommend smoking cessation on discharge.  Iron  deficiency anemia followed by oncology center with last visit 12/18/2022 with plan for IV iron  and supplementation of B12. Hemoglobin down to 7.8 with baseline around 11.  Platelets and WBC within normal limits.  Leukocytosis has resolved Currently menstruating.  Follow clinically.  CBC in AM.   Grade 1 obesity. Body mass index is  31.75 kg/m.  Would benefit from weight loss as outpatient.  Chronic migraine headache Chronic pain syndrome Lumbar radiculopathy Postlaminectomy syndrome Followed by neurology, orthopedics According to last orthopedic note plan was for chronic pain management with [facility].  Not felt to be an ideal candidate for spinal cord stimulator secondary to uncontrolled diabetes.      Subjective:  Feels better  Physical Exam: Vitals:   03/26/23 0440 03/26/23 0849 03/26/23 0942 03/26/23 0955  BP:  (!) 140/79  (!) 146/80  Pulse:  (!) 103  100  Resp: (!) 26 (!) 21    Temp:   99.3 F (37.4 C)   TempSrc:   Oral   SpO2:  98%    Weight:      Height:       Physical Exam Vitals reviewed.  Constitutional:      General: She is not in acute distress.    Appearance: She is not ill-appearing or toxic-appearing.  Cardiovascular:     Rate and Rhythm: Normal rate and regular rhythm.     Heart sounds: No murmur heard. Pulmonary:     Effort: Pulmonary effort is normal. No respiratory distress.     Breath sounds: No wheezing, rhonchi or rales.  Skin:    Comments: Left fifth toe with erythema, nontender given patient's neuropathy.    Neurological:     Mental Status: She is alert.  Psychiatric:        Mood and Affect: Mood normal.        Behavior: Behavior normal.     Data Reviewed: CBG 200-300 Troponins negative, lactic acid within normal limits, procalcitonin negative BMP and admission notable for CO2 of 18 with normal anion gap Hemoglobin down to 7.8 with baseline around  11.  Platelets and WBC within normal limits.  Leukocytosis has resolved CT chest negative for PE.  Patchy groundglass opacities indicative of atypical or viral pneumonia.  Chronic adenopathy consider sarcoid.  Enlarged pulmonary trunk indicative of pulmonary artery hypertension. Foot x-ray diffuse soft tissue swelling forefoot and fifth toe no osseous abnormality  Family Communication:  none  Disposition: Status is: Observation   Planned Discharge Destination: Home    Time spent: 31 43 year old woman PMH including diabetes mellitus type 2 presented with cough minutes  Author: Toribio Door, MD 03/26/2023 11:55 AM  For on call review [email or web address].

## 2023-03-27 DIAGNOSIS — E1142 Type 2 diabetes mellitus with diabetic polyneuropathy: Secondary | ICD-10-CM

## 2023-03-27 DIAGNOSIS — J189 Pneumonia, unspecified organism: Secondary | ICD-10-CM | POA: Diagnosis not present

## 2023-03-27 DIAGNOSIS — L03039 Cellulitis of unspecified toe: Secondary | ICD-10-CM

## 2023-03-27 DIAGNOSIS — N3000 Acute cystitis without hematuria: Secondary | ICD-10-CM

## 2023-03-27 DIAGNOSIS — M961 Postlaminectomy syndrome, not elsewhere classified: Secondary | ICD-10-CM | POA: Diagnosis not present

## 2023-03-27 LAB — BASIC METABOLIC PANEL
Anion gap: 8 (ref 5–15)
BUN: 16 mg/dL (ref 6–20)
CO2: 20 mmol/L — ABNORMAL LOW (ref 22–32)
Calcium: 8.2 mg/dL — ABNORMAL LOW (ref 8.9–10.3)
Chloride: 105 mmol/L (ref 98–111)
Creatinine, Ser: 0.88 mg/dL (ref 0.44–1.00)
GFR, Estimated: >60 mL/min (ref >60)
Glucose, Bld: 359 mg/dL — ABNORMAL HIGH (ref 70–99)
Potassium: 4.2 mmol/L (ref 3.5–5.1)
Sodium: 133 mmol/L — ABNORMAL LOW (ref 135–145)

## 2023-03-27 LAB — CBC
HCT: 18.7 % — ABNORMAL LOW (ref 36.0–46.0)
Hemoglobin: 6.4 g/dL — CL (ref 12.0–15.0)
MCH: 27.7 pg (ref 26.0–34.0)
MCHC: 34.2 g/dL (ref 30.0–36.0)
MCV: 81 fL (ref 80.0–100.0)
Platelets: 162 10*3/uL (ref 150–400)
RBC: 2.31 MIL/uL — ABNORMAL LOW (ref 3.87–5.11)
RDW: 20.3 % — ABNORMAL HIGH (ref 11.5–15.5)
WBC: 7.2 10*3/uL (ref 4.0–10.5)
nRBC: 0 % (ref 0.0–0.2)

## 2023-03-27 LAB — HEMOGLOBIN AND HEMATOCRIT, BLOOD
HCT: 22 % — ABNORMAL LOW (ref 36.0–46.0)
Hemoglobin: 7.5 g/dL — ABNORMAL LOW (ref 12.0–15.0)

## 2023-03-27 LAB — IRON AND TIBC
Iron: 46 ug/dL (ref 28–170)
Saturation Ratios: 15 % (ref 10.4–31.8)
TIBC: 308 ug/dL (ref 250–450)
UIBC: 262 ug/dL

## 2023-03-27 LAB — GLUCOSE, CAPILLARY
Glucose-Capillary: 283 mg/dL — ABNORMAL HIGH (ref 70–99)
Glucose-Capillary: 281 mg/dL — ABNORMAL HIGH (ref 70–99)
Glucose-Capillary: 189 mg/dL — ABNORMAL HIGH (ref 70–99)

## 2023-03-27 LAB — PREPARE RBC (CROSSMATCH)

## 2023-03-27 MED ORDER — SODIUM CHLORIDE 0.9% IV SOLUTION: Freq: Once | INTRAVENOUS | Status: DC

## 2023-03-27 MED ORDER — SODIUM CHLORIDE 0.9% IV SOLUTION: Freq: Once | INTRAVENOUS | Status: AC

## 2023-03-27 MED ORDER — AMOXICILLIN-POT CLAVULANATE 875-125 MG PO TABS: 1.0000 | ORAL_TABLET | Freq: Two times a day (BID) | ORAL | 0 refills | Status: AC

## 2023-03-27 MED ORDER — METOPROLOL TARTRATE 25 MG PO TABS: 25.0000 mg | ORAL_TABLET | Freq: Two times a day (BID) | ORAL | 0 refills | Status: DC

## 2023-03-27 NOTE — Inpatient Diabetes Management (Signed)
 Inpatient Diabetes Program Recommendations  AACE/ADA: New Consensus Statement on Inpatient Glycemic Control (2015)  Target Ranges:  Prepandial:   less than 140 mg/dL      Peak postprandial:   less than 180 mg/dL (1-2 hours)      Critically ill patients:  140 - 180 mg/dL   Lab Results  Component Value Date   GLUCAP 283 (H) 03/27/2023   HGBA1C 7.9 (A) 10/16/2022    Review of Glycemic Control  Latest Reference Range & Units 03/26/23 08:03 03/26/23 12:27 03/26/23 17:51 03/26/23 21:40 03/27/23 07:44  Glucose-Capillary 70 - 99 mg/dL 699 (H) 704 (H) 761 (H) 221 (H) 283 (H)  (H): Data is abnormally high  Diabetes history: DM2 Outpatient Diabetes medications: Lantus  30 units every day, Novolog  0-12 units TID, Ozempic  (not taking) Current orders for Inpatient glycemic control: Novolog  0-15 units TID and 0-5 units at bedtime, Semglee  15 units QD   Inpatient Diabetes Program Recommendations:     Semglee  30 (home dose) Obtain current A1C   Will continue to follow while inpatient.   Thank you, Wyvonna Pinal, MSN, CDCES Diabetes Coordinator Inpatient Diabetes Program [phone] (team pager from 8a-5p)   Inpatient Diabetes Program Recommendations:

## 2023-03-27 NOTE — TOC CM/SW Note (Signed)
 Transition of Care [facility]) - Inpatient Brief Assessment   Patient Details  Name: Melinda Stone MRN: [id number] Date of Birth: 02-17-1981  [facility]) CM/SW Contact:    Dalila Camellia SAUNDERS, LCSW Phone Number: 03/27/2023, 11:29 AM   Clinical Narrative:  Patient has insurance and a PCP.  Patient does not have any SDOH needs, no anticipated TOC needs at this time.    Transition of Care Asessment: Insurance and Status: Insurance coverage has been reviewed Patient has primary care physician: Yes Home environment has been reviewed: Yes Prior level of function:: Indep Prior/Current Home Services: No current home services Social Drivers of Health Review: SDOH reviewed no interventions necessary Readmission risk has been reviewed: Yes Transition of care needs: no transition of care needs at this time

## 2023-03-27 NOTE — Discharge Summary (Signed)
 Physician Discharge Summary   Patient: Melinda Stone MRN: [id number] DOB: May 25, 1980  Admit date:     03/25/2023  Discharge date: 03/27/23  Discharge Physician: Lorane Poland   PCP: [name], NP   Recommendations at discharge:   Follow-up with gynecology to discuss definitive treatment for menorrhagia Follow-up with primary care to discuss diabetes management Follow-up with podiatry for chronic foot ulcer Follow-up with pulmonology to discuss abnormal findings on CT scan  Discharge Diagnoses: Principal Problem:   Sepsis (HCC) Active Problems:   Failed back surgical syndrome   History of marijuana use   History of illicit drug use   Type 2 diabetes mellitus with diabetic polyneuropathy, without long-term current use of insulin  (HCC)   Post laminectomy syndrome   UTI (urinary tract infection)   Tobacco use   Mild intermittent asthma   IDA (iron  deficiency anemia)   Migraine   Multifocal pneumonia   CAP (community acquired pneumonia)   Cellulitis of fifth toe  Resolved Problems:   * No resolved hospital problems. *  Hospital Course: [name] is a 43 year old female with diabetes, chronic left fifth toe ulceration, diabetic peripheral neuropathy, tobacco use disorder, history of substance abuse, iron  deficiency anemia, menorrhagia, grade 1 obesity, chronic migraine headaches, lumbar radiculopathy, postlaminectomy syndrome, and chronic pain syndrome who presents to the ED complaining of cough.  Patient was found to be septic with possible UTI and cellulitis surrounding left fifth toe.  Chest CT was negative for PE but concerning for possible atypical or viral pneumonia.  Patient was initiated on antibiotics for community-acquired pneumonia, ultimately procalcitonin was negative and thus Zithromax  was discontinued.  She was continued on ceftriaxone  for cellulitis and UTI.  UA was consistent with UTI though no urine culture was sent.  Patient completed 3 days of  empiric ceftriaxone  therapy.  Patient was found to have a left fifth toe ulceration and cellulitis, x-ray was negative for any acute findings or concerning signs of osteomyelitis.  Patient's surrounding skin findings improved with antibiotic therapy.  She reports she is already established with a podiatrist that she has an appointment with later this month.  She will need to continue Augmentin  for an additional 4 days at discharge to treat this wound. Patient also has a history of iron  deficiency anemia and receives IV iron  and B12 supplementation outpatient.  Hemoglobin down trended during this admission.  Patient is currently menstruating and reports that due to her menorrhagia she has required blood transfusions in the past.  Hemoglobin was found to be 6.4 on 1/8 and patient received 1 unit PRBC with subsequent rise in hemoglobin to 7.5.  Patient endorsed feeling improved and requesting for discharge home. She will need to follow-up with her specialist team including podiatry, gynecology, and pulmonology.   Of note, on CT angio on arrival the patient was found to have small to borderline enlarged mediastinal lymph nodes as well as bihilar adenopathy.  It was suggested that these are chronic findings with the possibility of sarcoid.  A referral has been placed to pulmonology.  She needs to follow-up at discharge  Assessment and Plan: No notes have been filed under this hospital service. Service: Hospitalist        Consultants: n/a Procedures performed: n/a  Disposition: Home Diet recommendation:  Discharge Diet Orders (From admission, onward)     Start     Ordered   03/27/23 0000  Diet - low sodium heart healthy        03/27/23 1531  Carb modified diet DISCHARGE MEDICATION: Allergies as of 03/27/2023       Reactions   Trulicity  [dulaglutide ] Diarrhea        Medication List     STOP taking these medications    Emgality  120 MG/ML Soaj Generic drug:  Galcanezumab -gnlm   metoCLOPramide  10 MG tablet Commonly known as: REGLAN    ondansetron  4 MG disintegrating tablet Commonly known as: ZOFRAN -ODT   ondansetron  4 MG tablet Commonly known as: ZOFRAN    pantoprazole  40 MG tablet Commonly known as: PROTONIX    Semaglutide (0.25 or 0.5MG /DOS) 2 MG/3ML Sopn       TAKE these medications    acetaminophen  500 MG tablet Commonly known as: TYLENOL  Take 500 mg by mouth every 6 (six) hours as needed for mild pain or moderate pain.   amitriptyline  25 MG tablet Commonly known as: ELAVIL  Take 1 tablet (25 mg total) by mouth at bedtime.   amoxicillin -clavulanate 875-125 MG tablet Commonly known as: AUGMENTIN  Take 1 tablet by mouth 2 (two) times daily for 4 days.   celecoxib  100 MG capsule Commonly known as: CELEBREX  Take 100 mg by mouth 2 (two) times daily.   cholecalciferol 25 MCG (1000 UNIT) tablet Commonly known as: VITAMIN D3 Take 1,000 Units by mouth daily.   cyanocobalamin  1000 MCG tablet Commonly known as: VITAMIN B12 Take 1 tablet (1,000 mcg total) by mouth daily.   gabapentin  800 MG tablet Commonly known as: NEURONTIN  Take 800 mg by mouth 3 (three) times daily. What changed: Another medication with the same name was removed. Continue taking this medication, and follow the directions you see here.   Lantus  SoloStar 100 UNIT/ML Solostar Pen Generic drug: insulin  glargine Inject 30 Units into the skin at bedtime.   methocarbamol  500 MG tablet Commonly known as: ROBAXIN  Take 1 tablet (500 mg total) by mouth 2 (two) times daily. What changed: when to take this   metoprolol  tartrate 25 MG tablet Commonly known as: LOPRESSOR  Take 1 tablet (25 mg total) by mouth 2 (two) times daily.   NovoLOG  FlexPen 100 UNIT/ML FlexPen Generic drug: insulin  aspart Before each meal 3 times a day, 140-199 - 2 units, 200-250 - 4 units, 251-299 - 8 units,  300-349 - 10 units,  350 or above 12 units.   oxyCODONE -acetaminophen  5-325 MG  tablet Commonly known as: PERCOCET/ROXICET Take 1 tablet by mouth every 6 (six) hours as needed for moderate pain or severe pain.   tiZANidine  4 MG tablet Commonly known as: ZANAFLEX  Take 4 mg by mouth at bedtime.   Ubrelvy  100 MG Tabs Generic drug: Ubrogepant  Take 1 tablet (100 mg total) by mouth as needed. Take 1 tablet at onset of headache, may repeat in 2 hours if needed. Max is 200 mg in 24 hours.   vitamin C  250 MG tablet Commonly known as: ASCORBIC ACID  Take 2 tablets (500 mg total) by mouth daily. What changed: how much to take               Discharge Care Instructions  (From admission, onward)           Start     Ordered   03/27/23 0000  Discharge wound care:       Comments: Cleanse L 5th digit wound with Vashe wound cleanser, cut a strip of silver hydrofiber to fit onto wound bed and in between 4th and 5th toes. May secure with silicone foam or gauze and tape whichever is preferred.   03/27/23 1531  Discharge Exam: Filed Weights   03/25/23 1018 03/27/23 0500  Weight: 97.5 kg 94 kg   Constitutional:  Normal appearance. Non toxic-appearing.  HENT: Head Normocephalic and atraumatic.  Mucous membranes are moist.  Eyes:  Extraocular intact. Conjunctivae normal. Pupils are equal, round, and reactive to light.  Cardiovascular: Rate and Rhythm: Normal rate and regular rhythm.  Pulmonary: Non labored, symmetric rise of chest wall.  Musculoskeletal:  Normal range of motion.  Skin:   Left 5th toe ulcer, not actively bleeding or oozing, no surrounding erythema, minimal purulence appreciated on superficial wound bed Neurological: No focal deficit present. alert. Oriented. Psychiatric: Mood and Affect congruent.    Condition at discharge: stable  The results of significant diagnostics from this hospitalization (including imaging, microbiology, ancillary and laboratory) are listed below for reference.   Imaging Studies: DG Foot Complete  Left Result Date: 03/25/2023 CLINICAL DATA:  Fifth toe infected wound. EXAM: LEFT FOOT - COMPLETE 3+ VIEW COMPARISON:  None Available. FINDINGS: There is no evidence of fracture or dislocation. There is no evidence of arthropathy or other focal bone abnormality. There is diffuse soft tissue swelling of the forefoot and fifth toe. IMPRESSION: Diffuse soft tissue swelling of the forefoot and fifth toe. No acute osseous abnormality. Electronically Signed   By: Greig Pique M.D.   On: 03/25/2023 18:31   CT Angio Chest PE W and/or Wo Contrast Result Date: 03/25/2023 CLINICAL DATA:  Chest pain, shortness of breath and headache. EXAM: CT ANGIOGRAPHY CHEST WITH CONTRAST TECHNIQUE: Multidetector CT imaging of the chest was performed using the standard protocol during bolus administration of intravenous contrast. Multiplanar CT image reconstructions and MIPs were obtained to evaluate the vascular anatomy. RADIATION DOSE REDUCTION: This exam was performed according to the departmental dose-optimization program which includes automated exposure control, adjustment of the mA and/or kV according to patient size and/or use of iterative reconstruction technique. CONTRAST:  75mL OMNIPAQUE  IOHEXOL  350 MG/ML SOLN COMPARISON:  12/27/2021. FINDINGS: Cardiovascular: Negative for pulmonary embolus. Aberrant right subclavian artery. Enlarged pulmonic trunk and heart. No pericardial effusion. Mediastinum/Nodes: Similar small to borderline enlarged mediastinal lymph nodes. Bihilar adenopathy measures up to approximately 1.6 cm on the right, difficult to compare with 12/27/2021 due to lack of IV contrast on that study. No axillary adenopathy. Air-filled and mildly dilated upper esophagus. Lungs/Pleura: New patchy upper and midlung zone predominant peribronchovascular ground-glass. Tiny bilateral pleural effusions. Airway is unremarkable. Upper Abdomen: Spleen may be enlarged but is incompletely imaged. Visualized portions of the liver,  adrenal glands, kidneys, spleen, pancreas, stomach and bowel are otherwise grossly unremarkable. No upper abdominal adenopathy. Musculoskeletal: None. Review of the MIP images confirms the above findings. IMPRESSION: 1. Negative for pulmonary embolus. 2. Patchy upper mid lung zone predominant peribronchovascular ground-glass, indicative an atypical or viral pneumonia. 3. Tiny bilateral 4. Chronic mild mediastinal and hilar adenopathy. Possibility of sarcoid is not excluded. Pleural effusions. 5. Spleen may be enlarged but is incompletely imaged. 6. Enlarged pulmonic trunk, indicative of pulmonary arterial hypertension. Electronically Signed   By: Newell Eke M.D.   On: 03/25/2023 18:15   DG Chest 2 View Result Date: 03/25/2023 CLINICAL DATA:  Chest pain and shortness of breath. EXAM: CHEST - 2 VIEW COMPARISON:  02/04/2023 FINDINGS: Heart size is within normal limits. Multifocal ill-defined nodular opacities are seen bilaterally which are new since previous study. No evidence of pleural effusion. IMPRESSION: Multifocal ill-defined bilateral pulmonary nodular opacities , which may be due to metastatic disease or infectious/inflammatory etiologies. Chest CT with contrast  is recommended for further evaluation. Electronically Signed   By: Norleen DELENA Kil M.D.   On: 03/25/2023 10:42    Microbiology: Results for orders placed or performed during the hospital encounter of 03/25/23  Resp panel by RT-PCR (RSV, Flu A&B, Covid) Anterior Nasal Swab     Status: None   Collection Time: 03/25/23  6:24 PM   Specimen: Anterior Nasal Swab  Result Value Ref Range Status   SARS Coronavirus 2 by RT PCR NEGATIVE NEGATIVE Final    Comment: (NOTE) SARS-CoV-2 target nucleic acids are NOT DETECTED.  The SARS-CoV-2 RNA is generally detectable in upper respiratory specimens during the acute phase of infection. The lowest concentration of SARS-CoV-2 viral copies this assay can detect is 138 copies/mL. A negative result does  not preclude SARS-Cov-2 infection and should not be used as the sole basis for treatment or other patient management decisions. A negative result may occur with  improper specimen collection/handling, submission of specimen other than nasopharyngeal swab, presence of viral mutation(s) within the areas targeted by this assay, and inadequate number of viral copies(<138 copies/mL). A negative result must be combined with clinical observations, patient history, and epidemiological information. The expected result is Negative.  Fact Sheet for Patients:  bloggercourse.com  Fact Sheet for Healthcare Providers:  seriousbroker.it  This test is no t yet approved or cleared by the [place]  FDA and  has been authorized for detection and/or diagnosis of SARS-CoV-2 by FDA under an Emergency Use Authorization (EUA). This EUA will remain  in effect (meaning this test can be used) for the duration of the COVID-19 declaration under Section 564(b)(1) of the Act, 21 U.S.C.section 360bbb-3(b)(1), unless the authorization is terminated  or revoked sooner.       Influenza A by PCR NEGATIVE NEGATIVE Final   Influenza B by PCR NEGATIVE NEGATIVE Final    Comment: (NOTE) The Xpert Xpress SARS-CoV-2/FLU/RSV plus assay is intended as an aid in the diagnosis of influenza from Nasopharyngeal swab specimens and should not be used as a sole basis for treatment. Nasal washings and aspirates are unacceptable for Xpert Xpress SARS-CoV-2/FLU/RSV testing.  Fact Sheet for Patients: bloggercourse.com  Fact Sheet for Healthcare Providers: seriousbroker.it  This test is not yet approved or cleared by the [place]  FDA and has been authorized for detection and/or diagnosis of SARS-CoV-2 by FDA under an Emergency Use Authorization (EUA). This EUA will remain in effect (meaning this test can be used) for the  duration of the COVID-19 declaration under Section 564(b)(1) of the Act, 21 U.S.C. section 360bbb-3(b)(1), unless the authorization is terminated or revoked.     Resp Syncytial Virus by PCR NEGATIVE NEGATIVE Final    Comment: (NOTE) Fact Sheet for Patients: bloggercourse.com  Fact Sheet for Healthcare Providers: seriousbroker.it  This test is not yet approved or cleared by the [place]  FDA and has been authorized for detection and/or diagnosis of SARS-CoV-2 by FDA under an Emergency Use Authorization (EUA). This EUA will remain in effect (meaning this test can be used) for the duration of the COVID-19 declaration under Section 564(b)(1) of the Act, 21 U.S.C. section 360bbb-3(b)(1), unless the authorization is terminated or revoked.  Performed at [facility], [address], [place], [place] [address]   Respiratory (~20 pathogens) panel by PCR     Status: None   Collection Time: 03/25/23  6:24 PM   Specimen: Nasopharyngeal Swab; Respiratory  Result Value Ref Range Status   Adenovirus NOT DETECTED NOT DETECTED Final   Coronavirus  229E NOT DETECTED NOT DETECTED Final    Comment: (NOTE) The Coronavirus on the Respiratory Panel, DOES NOT test for the novel  Coronavirus (2019 nCoV)    Coronavirus HKU1 NOT DETECTED NOT DETECTED Final   Coronavirus NL63 NOT DETECTED NOT DETECTED Final   Coronavirus OC43 NOT DETECTED NOT DETECTED Final   Metapneumovirus NOT DETECTED NOT DETECTED Final   Rhinovirus / Enterovirus NOT DETECTED NOT DETECTED Final   Influenza A NOT DETECTED NOT DETECTED Final   Influenza B NOT DETECTED NOT DETECTED Final   Parainfluenza Virus 1 NOT DETECTED NOT DETECTED Final   Parainfluenza Virus 2 NOT DETECTED NOT DETECTED Final   Parainfluenza Virus 3 NOT DETECTED NOT DETECTED Final   Parainfluenza Virus 4 NOT DETECTED NOT DETECTED Final   Respiratory Syncytial Virus NOT DETECTED NOT DETECTED  Final   Bordetella pertussis NOT DETECTED NOT DETECTED Final   Bordetella Parapertussis NOT DETECTED NOT DETECTED Final   Chlamydophila pneumoniae NOT DETECTED NOT DETECTED Final   Mycoplasma pneumoniae NOT DETECTED NOT DETECTED Final    Comment: Performed at [facility], [address] [place], [place] [address]  Blood culture (routine x 2)     Status: None (Preliminary result)   Collection Time: 03/25/23  8:05 PM   Specimen: BLOOD LEFT ARM  Result Value Ref Range Status   Specimen Description   Final    BLOOD LEFT ARM Performed at [facility], [address] [place], [place] [address]    Special Requests   Final    BOTTLES DRAWN AEROBIC ONLY Blood Culture adequate volume Performed at [facility], [address] [place], [place] [address]    Culture   Final    NO GROWTH 2 DAYS Performed at [facility], [address], [place], [place] [address]    Report Status PENDING  Incomplete  Blood culture (routine x 2)     Status: None (Preliminary result)   Collection Time: 03/25/23  8:20 PM   Specimen: BLOOD RIGHT ARM  Result Value Ref Range Status   Specimen Description   Final    BLOOD RIGHT ARM Performed at [facility], [address] [place], [place] [address]    Special Requests   Final    BOTTLES DRAWN AEROBIC ONLY Blood Culture results may not be optimal due to an inadequate volume of blood received in culture bottles Performed at [facility], [address] [place], [place] [address]    Culture   Final    NO GROWTH 2 DAYS Performed at [facility], [address], [place], [place] [address]    Report Status PENDING  Incomplete    Labs: CBC: Recent Labs  Lab 03/25/23 1307 03/26/23 0505 03/27/23 0440 03/27/23 1119  WBC 13.6* 10.2 7.2  --   HGB 9.7* 7.8* 6.4* 7.5*  HCT 27.7* 22.3* 18.7* 22.0*  MCV 81.0 81.7 81.0  --   PLT 166 155 162  --    Basic Metabolic  Panel: Recent Labs  Lab 03/25/23 1307 03/26/23 0505 03/27/23 0440  NA 135 132* 133*  K 4.8 4.0 4.2  CL 109 107 105  CO2 18* 18* 20*  GLUCOSE 188* 288* 359*  BUN 14 15 16   CREATININE 0.58 0.72 0.88  CALCIUM  8.6* 8.0* 8.2*  MG  --  2.1  --    Liver Function Tests: No results for input(s): AST, ALT, ALKPHOS, BILITOT, PROT, ALBUMIN in the last 168  hours. CBG: Recent Labs  Lab 03/26/23 1227 03/26/23 1751 03/26/23 2140 03/27/23 0744 03/27/23 1218  GLUCAP 295* 238* 221* 283* 281*    Discharge time spent: greater than 30 minutes.  Signed: Allie Ousley, DO [facility] 03/27/2023

## 2023-03-27 NOTE — Plan of Care (Signed)

## 2023-03-28 ENCOUNTER — Telehealth: Payer: Self-pay

## 2023-03-28 LAB — LEGIONELLA PNEUMOPHILA SEROGP 1 UR AG: L. pneumophila Serogp 1 Ur Ag: NEGATIVE

## 2023-03-28 LAB — TYPE AND SCREEN
ABO/RH(D): A POS
Antibody Screen: NEGATIVE
Unit division: 0

## 2023-03-28 LAB — BPAM RBC
Blood Product Expiration Date: 202502032359
ISSUE DATE / TIME: 202501080801
Unit Type and Rh: 6200

## 2023-03-28 NOTE — Transitions of Care (Post Inpatient/ED Visit) (Signed)
 03/28/2023  Name: Melinda Stone MRN: [id number] DOB: 1980/08/27  Today's TOC FU Call Status: Today's TOC FU Call Status:: Successful TOC FU Call Completed TOC FU Call Complete Date: 03/28/23 Patient's Name and Date of Birth confirmed.  Transition Care Management Follow-up Telephone Call Date of Discharge: 03/27/23 Discharge Facility: [facility]) Type of Discharge: Inpatient Admission Primary Inpatient Discharge Diagnosis:: Sepsis How have you been since you were released from the hospital?: Same Any questions or concerns?: No  Items Reviewed: Did you receive and understand the discharge instructions provided?: Yes Medications obtained,verified, and reconciled?: Yes (Medications Reviewed) Any new allergies since your discharge?: No Dietary orders reviewed?: Yes Type of Diet Ordered:: low salt heart healthy Do you have support at home?: Yes People in Home: child(ren), adult  Medications Reviewed Today: Medications Reviewed Today     Reviewed by Rumalda Alan PENNER, RN (Registered Nurse) on 03/28/23 at 1134  Med List Status: <None>   Medication Order Taking? Sig Documenting Provider Last Dose Status Informant  acetaminophen  (TYLENOL ) 500 MG tablet [id number] Yes Take 500 mg by mouth every 6 (six) hours as needed for mild pain or moderate pain. [provider] Taking Active Self  amitriptyline  (ELAVIL ) 25 MG tablet [id number] Yes Take 1 tablet (25 mg total) by mouth at bedtime. Gayland Lauraine PARAS, NP Taking Active Self  amoxicillin -clavulanate (AUGMENTIN ) 875-125 MG tablet [id number] Yes Take 1 tablet by mouth 2 (two) times daily for 4 days. Dezii, Alexandra, DO Taking Active   celecoxib  (CELEBREX ) 100 MG capsule [id number] Yes Take 100 mg by mouth 2 (two) times daily. [provider] Taking Active Self  cholecalciferol (VITAMIN D3) 25 MCG (1000 UNIT) tablet [id number] Yes Take 1,000 Units by mouth daily. [provider] Taking Active Self  cyanocobalamin   (VITAMIN B12) 1000 MCG tablet [id number] Yes Take 1 tablet (1,000 mcg total) by mouth daily. Babara Call, MD Taking Active Self  gabapentin  (NEURONTIN ) 800 MG tablet [id number] Yes Take 800 mg by mouth 3 (three) times daily. [provider] Taking Active Self  insulin  aspart (NOVOLOG ) 100 UNIT/ML FlexPen [id number] Yes Before each meal 3 times a day, 140-199 - 2 units, 200-250 - 4 units, 251-299 - 8 units,  300-349 - 10 units,  350 or above 12 units. Singh, Prashant K, MD Taking Active Self  insulin  glargine (LANTUS  SOLOSTAR) 100 UNIT/ML Solostar Pen [id number] Yes Inject 30 Units into the skin at bedtime. Singh, Prashant K, MD Taking Active Self  methocarbamol  (ROBAXIN ) 500 MG tablet [id number] Yes Take 1 tablet (500 mg total) by mouth 2 (two) times daily.  Patient taking differently: Take 500 mg by mouth at bedtime.   Beverley Leita LABOR, PA-C Taking Active Self  metoprolol  tartrate (LOPRESSOR ) 25 MG tablet [id number] Yes Take 1 tablet (25 mg total) by mouth 2 (two) times daily. Dezii, Alexandra, DO Taking Active   oxyCODONE -acetaminophen  (PERCOCET/ROXICET) 5-325 MG tablet [id number] Yes Take 1 tablet by mouth every 6 (six) hours as needed for moderate pain or severe pain. [provider] Taking Active Self  tiZANidine  (ZANAFLEX ) 4 MG tablet [id number] Yes Take 4 mg by mouth at bedtime. [provider] Taking Active Self  Ubrogepant  (UBRELVY ) 100 MG TABS [id number]  Take 1 tablet (100 mg total) by mouth as needed. Take 1 tablet at onset of headache, may repeat in 2 hours if needed. Max is 200 mg in 24 hours. Gayland Lauraine PARAS, NP  Active Self  vitamin C  (ASCORBIC ACID ) 250 MG tablet [id number] Yes Take 2 tablets (  500 mg total) by mouth daily.  Patient taking differently: Take 250 mg by mouth daily.   Babara Call, MD Taking Active Self            Home Care and Equipment/Supplies: Were Home Health Services Ordered?: No Any new equipment or medical supplies ordered?: No  Functional  Questionnaire: Do you need assistance with bathing/showering or dressing?: Yes (children) Do you need assistance with meal preparation?: Yes (children) Do you need assistance with eating?: No Do you have difficulty maintaining continence: No Do you need assistance with getting out of bed/getting out of a chair/moving?: Yes (difficulty sitting up due to pain, uses walker and wheelchair) Do you have difficulty managing or taking your medications?: No  Follow up appointments reviewed: PCP Follow-up appointment confirmed?: No (patient will call today) MD Provider Line Number:[phone] Given: No Specialist Hospital Follow-up appointment confirmed?: Yes Date of Specialist follow-up appointment?: 04/10/23 Follow-Up Specialty Provider:: GYN Do you need transportation to your follow-up appointment?: Yes (Can use medicaid) Do you understand care options if your condition(s) worsen?: Yes-patient verbalized understanding  SDOH Interventions Today    Flowsheet Row Most Recent Value  SDOH Interventions   Food Insecurity Interventions Intervention Not Indicated  Housing Interventions Intervention Not Indicated  Transportation Interventions Intervention Not Indicated, Other (Comment)  [Medicaid benefit reviewed]  Utilities Interventions Intervention Not Indicated      Patient reports that she is doing well. Confirmed PCP.  Reports that she is self managing well. Reports that she has follow up planned with pulmonary, GYN and podiatry.  Offered patient 30 day TOC program and she declined.  Reviewed medicaid benefits for transportation with patient and encouraged patient to use her benefit.  Interventions Today    Flowsheet Row Most Recent Value  Chronic Disease   Chronic disease during today's visit Diabetes, Other  [Sepsis]  Nutrition Interventions   Nutrition Discussed/Reviewed Nutrition Discussed, Carbohydrate meal planning  Pharmacy Interventions   Pharmacy Dicussed/Reviewed Medications  and their functions      Alan Ee, RN, BSN, APPLE COMPUTER Population Health- Transition of Care Team.  [facility] [phone]

## 2023-03-30 LAB — CULTURE, BLOOD (ROUTINE X 2)
Culture: NO GROWTH
Culture: NO GROWTH
Special Requests: ADEQUATE

## 2023-04-10 ENCOUNTER — Encounter: Payer: Self-pay | Admitting: Obstetrics & Gynecology

## 2023-04-10 ENCOUNTER — Encounter: Payer: Self-pay | Admitting: Oncology

## 2023-04-10 ENCOUNTER — Ambulatory Visit (INDEPENDENT_AMBULATORY_CARE_PROVIDER_SITE_OTHER): Payer: 59 | Admitting: Obstetrics & Gynecology

## 2023-04-10 ENCOUNTER — Other Ambulatory Visit (HOSPITAL_COMMUNITY)
Admission: RE | Admit: 2023-04-10 | Discharge: 2023-04-10 | Disposition: A | Discharge status: Home / Self Care | Payer: 59 | Source: Ambulatory Visit | Attending: Obstetrics & Gynecology | Admitting: Obstetrics & Gynecology

## 2023-04-10 VITALS — BP 129/78 | HR 86 | Wt 207.0 lb

## 2023-04-10 DIAGNOSIS — R102 Pelvic and perineal pain: Secondary | ICD-10-CM

## 2023-04-10 DIAGNOSIS — N920 Excessive and frequent menstruation with regular cycle: Secondary | ICD-10-CM | POA: Diagnosis not present

## 2023-04-10 LAB — CERVICOVAGINAL ANCILLARY ONLY
Bacterial Vaginitis (gardnerella): NEGATIVE
Candida Glabrata: NEGATIVE
Candida Vaginitis: NEGATIVE
Chlamydia: NEGATIVE
Comment: NEGATIVE
Comment: NEGATIVE
Comment: NEGATIVE
Comment: NEGATIVE
Comment: NEGATIVE
Comment: NORMAL
Neisseria Gonorrhea: NEGATIVE
Trichomonas: NEGATIVE

## 2023-04-10 NOTE — Progress Notes (Signed)
Patient ID: Melinda Stone, female   DOB: 10/13/1980, 43 y.o.   MRN: [id number]  Chief Complaint  Patient presents with   Gynecologic Exam  Pelvic pain and heavy periods  HPI Melinda Stone is a 43 y.o. female.  G6P0 No LMP recorded. She was admitted with gastroenteritis 11/24 and since then she has had pain with intercourse and LLQ pain, menses lasting 10-14 days. [place] and CT have shown endometrium 21 mm possible endometrial lesion.  HPI  Past Medical History:  Diagnosis Date   Anemia    Arthritis 08/17/2020   Chronic back pain    Diabetes mellitus without complication (HCC)    type 2   GERD (gastroesophageal reflux disease)    Migraine    Ovarian cyst    Pneumonia    Polyneuropathy     Past Surgical History:  Procedure Laterality Date   CESAREAN SECTION     CHOLECYSTECTOMY  2004   LUMBAR LAMINECTOMY N/A 05/30/2020   Procedure: Lumbar five Laminectomy,  Bilateral Microdiscectomy, Left Lumbar five -Sacral one Microdiscectomy;  Surgeon: [name], MD;  Location: [facility] OR;  Service: Orthopedics;  Laterality: N/A;   LUMBAR LAMINECTOMY Left 11/14/2020   Procedure: LEFT LUMBAR FOUR-FIVE MICRODISCECTOMY;  Surgeon: [name], MD;  Location: [facility] OR;  Service: Orthopedics;  Laterality: Left;   TUBAL LIGATION  10/19/2010    Family History  Problem Relation Age of Onset   Diabetes Mother    Hypertension Mother    Stroke Mother     Social History Social History   Tobacco Use   Smoking status: Every Day    Types: Cigars    Last attempt to quit: 12/23/2021    Years since quitting: 1.2   Smokeless tobacco: Never   Tobacco comments:    2 cigars daily. Khj 02/12/2023  Vaping Use   Vaping status: Never Used  Substance Use Topics   Alcohol use: Not Currently   Drug use: Not Currently    Comment: per patient stopped smoking marijuana Mar 2022    Allergies  Allergen Reactions   Trulicity [Dulaglutide] Diarrhea    Current Outpatient Medications  Medication  Sig Dispense Refill   acetaminophen (TYLENOL) 500 MG tablet Take 500 mg by mouth every 6 (six) hours as needed for mild pain or moderate pain.     amitriptyline (ELAVIL) 25 MG tablet Take 1 tablet (25 mg total) by mouth at bedtime. 30 tablet 6   celecoxib (CELEBREX) 100 MG capsule Take 100 mg by mouth 2 (two) times daily.     cholecalciferol (VITAMIN D3) 25 MCG (1000 UNIT) tablet Take 1,000 Units by mouth daily.     cyanocobalamin (VITAMIN B12) 1000 MCG tablet Take 1 tablet (1,000 mcg total) by mouth daily. 90 tablet 1   gabapentin (NEURONTIN) 800 MG tablet Take 800 mg by mouth 3 (three) times daily.     insulin aspart (NOVOLOG) 100 UNIT/ML FlexPen Before each meal 3 times a day, 140-199 - 2 units, 200-250 - 4 units, 251-299 - 8 units,  300-349 - 10 units,  350 or above 12 units. 15 mL 0   insulin glargine (LANTUS SOLOSTAR) 100 UNIT/ML Solostar Pen Inject 30 Units into the skin at bedtime. 9 mL 0   methocarbamol (ROBAXIN) 500 MG tablet Take 1 tablet (500 mg total) by mouth 2 (two) times daily. (Patient taking differently: Take 500 mg by mouth at bedtime.) 20 tablet 0   metoprolol tartrate (LOPRESSOR) 25 MG tablet Take 1 tablet (25 mg  total) by mouth 2 (two) times daily. 60 tablet 0   oxyCODONE-acetaminophen (PERCOCET/ROXICET) 5-325 MG tablet Take 1 tablet by mouth every 6 (six) hours as needed for moderate pain or severe pain.     tiZANidine (ZANAFLEX) 4 MG tablet Take 4 mg by mouth at bedtime.     Ubrogepant (UBRELVY) 100 MG TABS Take 1 tablet (100 mg total) by mouth as needed. Take 1 tablet at onset of headache, may repeat in 2 hours if needed. Max is 200 mg in 24 hours. 12 tablet 11   vitamin C (ASCORBIC ACID) 250 MG tablet Take 2 tablets (500 mg total) by mouth daily. (Patient taking differently: Take 250 mg by mouth daily.) 180 tablet 0   No current facility-administered medications for this visit.    Review of Systems Review of Systems  Constitutional: Negative.   Gastrointestinal:   Positive for abdominal pain and nausea. Negative for constipation.  Genitourinary:  Positive for dyspareunia, menstrual problem and pelvic pain. Negative for vaginal discharge.    Blood pressure 129/78, pulse 86, weight 207 lb (93.9 kg).  Physical Exam Physical Exam Vitals and nursing note reviewed. Exam conducted with a chaperone present.  Constitutional:      Appearance: Normal appearance. She is not ill-appearing.  Pulmonary:     Effort: Pulmonary effort is normal.  Genitourinary:    General: Normal vulva.     Exam position: Lithotomy position.     Vagina: Vaginal discharge present.     Cervix: Normal.     Uterus: Normal. Tender.      Adnexa: Right adnexa normal and left adnexa normal.  Neurological:     Mental Status: She is alert.  Psychiatric:        Mood and Affect: Mood normal.        Behavior: Behavior normal.     Data Reviewed  Narrative & Impression  CLINICAL DATA:  Abdominal pain, acute, nonlocalized   EXAM: CT ABDOMEN AND PELVIS WITH CONTRAST   TECHNIQUE: Multidetector CT imaging of the abdomen and pelvis was performed using the standard protocol following bolus administration of intravenous contrast.   RADIATION DOSE REDUCTION: This exam was performed according to the departmental dose-optimization program which includes automated exposure control, adjustment of the mA and/or kV according to patient size and/or use of iterative reconstruction technique.   CONTRAST:  75mL OMNIPAQUE IOHEXOL 350 MG/ML SOLN   COMPARISON:  June 21, 2022, December 24, 2022 May 13, 2022 June 09, 2021   FINDINGS: Lower chest: No acute abnormality.   Hepatobiliary: Status post cholecystectomy. Hepatic steatosis with hepatomegaly. Similar dilation of the common bile duct to approximately 9 mm.   Pancreas: Unremarkable. No pancreatic ductal dilatation or surrounding inflammatory changes.   Spleen: There is splenomegaly, similar in comparison to priors. Porta  splenic varices.   Adrenals/Urinary Tract: Adrenal glands are unremarkable. Kidneys enhance symmetrically. No hydronephrosis. No obstructing nephrolithiasis. Bladder is unremarkable.   Stomach/Bowel: No evidence of bowel obstruction. Appendix is normal. Stomach is unremarkable. The descending colon is decompressed with subjective bowel wall prominence and possible faint adjacent fat stranding   Vascular/Lymphatic: Abdominal aorta is normal in course and caliber. No new suspicious lymphadenopathy.   Reproductive: There is heterogeneous expansion of the endometrial cavity. It measures at least 21 mm in thickness. There are some internal foci of high density which could reflect a polyp versus blood product (series 3, image 70). Fibroid uterus. Similar appearance of the adnexa.   Other: Small volume free fluid in the pelvis.  Musculoskeletal: Severe degenerative changes of L4-5 with progressive endplate sclerosis since 2023.   IMPRESSION: 1. Subjective bowel wall prominence of the descending colon. This could reflect a nonspecific colitis in the appropriate clinical setting. 2. Hepatic steatosis with hepatosplenomegaly. 3. There is heterogeneous expansion of the endometrial cavity. It measures at least 21 mm in thickness. There are some internal foci of high density which could reflect a polyp versus blood products. This is similar to previous April 2024 pelvic ultrasound. Recommend follow-up with gynecologist.     Electronically Signed   By: Meda Klinefelter M.D.   On: 02/09/2023 17:27     Assessment Pelvic pain in female - Plan: Cervicovaginal ancillary only( [place]), [place] Sonohysterogram  Menorrhagia with regular cycle - Plan: [place] Sonohysterogram   Plan STD testing pending F/u evaluation for endometrial lesion Consider cycle control possible [id number]    Scheryl Darter 04/10/2023, 9:26 AM

## 2023-04-10 NOTE — Progress Notes (Signed)
Pt is having painful intercourse x 2 months.  Pt also has bleeding/spotting after intercourse. Pt has heavy monthly cycles, lasting 1-2 weeks.

## 2023-04-16 DIAGNOSIS — M4727 Other spondylosis with radiculopathy, lumbosacral region: Secondary | ICD-10-CM | POA: Diagnosis not present

## 2023-04-16 DIAGNOSIS — M545 Low back pain, unspecified: Secondary | ICD-10-CM | POA: Diagnosis not present

## 2023-04-16 DIAGNOSIS — M791 Myalgia, unspecified site: Secondary | ICD-10-CM | POA: Diagnosis not present

## 2023-04-16 DIAGNOSIS — Z79891 Long term (current) use of opiate analgesic: Secondary | ICD-10-CM | POA: Diagnosis not present

## 2023-04-16 DIAGNOSIS — M961 Postlaminectomy syndrome, not elsewhere classified: Secondary | ICD-10-CM | POA: Diagnosis not present

## 2023-04-16 DIAGNOSIS — G894 Chronic pain syndrome: Secondary | ICD-10-CM | POA: Diagnosis not present

## 2023-04-20 ENCOUNTER — Encounter: Payer: Self-pay | Admitting: Oncology

## 2023-04-21 ENCOUNTER — Emergency Department (HOSPITAL_COMMUNITY)
Admission: EM | Admit: 2023-04-21 | Discharge: 2023-04-21 | Disposition: A | Discharge status: Home / Self Care | Payer: 59 | Attending: Emergency Medicine | Admitting: Emergency Medicine

## 2023-04-21 ENCOUNTER — Encounter (HOSPITAL_COMMUNITY): Payer: Self-pay | Admitting: *Deleted

## 2023-04-21 ENCOUNTER — Emergency Department (HOSPITAL_COMMUNITY): Payer: 59

## 2023-04-21 ENCOUNTER — Other Ambulatory Visit: Payer: Self-pay

## 2023-04-21 DIAGNOSIS — R1013 Epigastric pain: Secondary | ICD-10-CM | POA: Diagnosis not present

## 2023-04-21 DIAGNOSIS — Z794 Long term (current) use of insulin: Secondary | ICD-10-CM | POA: Diagnosis not present

## 2023-04-21 DIAGNOSIS — M7989 Other specified soft tissue disorders: Secondary | ICD-10-CM | POA: Diagnosis present

## 2023-04-21 DIAGNOSIS — E875 Hyperkalemia: Secondary | ICD-10-CM | POA: Diagnosis not present

## 2023-04-21 DIAGNOSIS — R0602 Shortness of breath: Secondary | ICD-10-CM | POA: Insufficient documentation

## 2023-04-21 DIAGNOSIS — E119 Type 2 diabetes mellitus without complications: Secondary | ICD-10-CM | POA: Diagnosis not present

## 2023-04-21 DIAGNOSIS — F17200 Nicotine dependence, unspecified, uncomplicated: Secondary | ICD-10-CM | POA: Insufficient documentation

## 2023-04-21 LAB — COMPREHENSIVE METABOLIC PANEL
ALT: 10 U/L (ref 0–44)
AST: 18 U/L (ref 15–41)
Albumin: 2.8 g/dL — ABNORMAL LOW (ref 3.5–5.0)
Alkaline Phosphatase: 102 U/L (ref 38–126)
Anion gap: 7 (ref 5–15)
BUN: 21 mg/dL — ABNORMAL HIGH (ref 6–20)
CO2: 15 mmol/L — ABNORMAL LOW (ref 22–32)
Calcium: 8 mg/dL — ABNORMAL LOW (ref 8.9–10.3)
Chloride: 113 mmol/L — ABNORMAL HIGH (ref 98–111)
Creatinine, Ser: 0.93 mg/dL (ref 0.44–1.00)
GFR, Estimated: >60 mL/min (ref >60)
Glucose, Bld: 275 mg/dL — ABNORMAL HIGH (ref 70–99)
Potassium: 5.5 mmol/L — ABNORMAL HIGH (ref 3.5–5.1)
Sodium: 135 mmol/L (ref 135–145)
Total Bilirubin: 1.1 mg/dL (ref 0.0–1.2)
Total Protein: 5.7 g/dL — ABNORMAL LOW (ref 6.5–8.1)

## 2023-04-21 LAB — TROPONIN I (HIGH SENSITIVITY)
Troponin I (High Sensitivity): 4 ng/L (ref <18)
Troponin I (High Sensitivity): 4 ng/L (ref <18)

## 2023-04-21 LAB — CBC
HCT: 26.7 % — ABNORMAL LOW (ref 36.0–46.0)
Hemoglobin: 8.6 g/dL — ABNORMAL LOW (ref 12.0–15.0)
MCH: 28 pg (ref 26.0–34.0)
MCHC: 32.2 g/dL (ref 30.0–36.0)
MCV: 87 fL (ref 80.0–100.0)
Platelets: 133 10*3/uL — ABNORMAL LOW (ref 150–400)
RBC: 3.07 MIL/uL — ABNORMAL LOW (ref 3.87–5.11)
RDW: 19.4 % — ABNORMAL HIGH (ref 11.5–15.5)
WBC: 7.2 10*3/uL (ref 4.0–10.5)
nRBC: 0 % (ref 0.0–0.2)

## 2023-04-21 LAB — BRAIN NATRIURETIC PEPTIDE: B Natriuretic Peptide: 238.5 pg/mL — ABNORMAL HIGH (ref 0.0–100.0)

## 2023-04-21 LAB — HCG, SERUM, QUALITATIVE: Preg, Serum: NEGATIVE

## 2023-04-21 MED ORDER — FUROSEMIDE 20 MG PO TABS: 20.0000 mg | ORAL_TABLET | Freq: Every day | ORAL | 0 refills | Status: DC

## 2023-04-21 MED ORDER — LIDOCAINE VISCOUS HCL 2 % MT SOLN
15.0000 mL | Freq: Once | OROMUCOSAL | Status: AC
  Administered 2023-04-21: 15 mL via ORAL
  Filled 2023-04-21: qty 15

## 2023-04-21 MED ORDER — ALUM & MAG HYDROXIDE-SIMETH 200-200-20 MG/5ML PO SUSP
30.0000 mL | Freq: Once | ORAL | Status: AC
  Administered 2023-04-21: 30 mL via ORAL
  Filled 2023-04-21: qty 30

## 2023-04-21 MED ORDER — OXYCODONE-ACETAMINOPHEN 5-325 MG PO TABS
1.0000 | ORAL_TABLET | Freq: Once | ORAL | Status: AC
  Administered 2023-04-21: 1 via ORAL
  Filled 2023-04-21: qty 1

## 2023-04-21 MED ORDER — ONDANSETRON 4 MG PO TBDP
4.0000 mg | ORAL_TABLET | Freq: Four times a day (QID) | ORAL | 0 refills | Status: DC | PRN
Start: 2023-04-21 — End: 2023-05-29

## 2023-04-21 MED ORDER — FUROSEMIDE 20 MG PO TABS
40.0000 mg | ORAL_TABLET | Freq: Once | ORAL | Status: AC
  Administered 2023-04-21: 40 mg via ORAL
  Filled 2023-04-21: qty 2

## 2023-04-21 MED ORDER — ONDANSETRON 4 MG PO TBDP
4.0000 mg | ORAL_TABLET | Freq: Once | ORAL | Status: AC
  Administered 2023-04-21: 4 mg via ORAL
  Filled 2023-04-21: qty 1

## 2023-04-21 NOTE — ED Triage Notes (Signed)
PT states bil LE swelling and abdominal swelling x 4 days.  Denies cardiac hx.  Denies decreased urination.  States + sob with activity and states recently she has to sleep elevated on pillows.

## 2023-04-21 NOTE — ED Notes (Signed)
 Pt ambulated to the bathroom without assistance.

## 2023-04-21 NOTE — ED Provider Notes (Signed)
[place] EMERGENCY DEPARTMENT AT [facility] Provider Note   CSN: [id number] Arrival date & time: 04/21/23  1047     History  Chief Complaint  Patient presents with   Leg Swelling    Melinda Stone is a 43 y.o. female with extensive past medical history significant for chronic pain syndrome, lumbar stenosis, anemia, diabetes, tobacco use, arthritis who presents concern for generalized swelling of legs/body.  She reports that it initially felt like it was just her legs but now she does not notice any swelling her legs.  She reports shortness of breath with activity and with lying flat.  No previous history of heart failure but some stiffness noted on echocardiogram from 2023.  No recent evaluation by cardiologist.  She denies any changes to urination.  She also endorses some upper abdominal pain which is somewhat chronic for her but slightly worse today.  She endorses some nausea without vomiting.  HPI     Home Medications Prior to Admission medications   Medication Sig Start Date End Date Taking? Authorizing Provider  furosemide (LASIX) 20 MG tablet Take 1 tablet (20 mg total) by mouth daily. 04/21/23  Yes Lil Lepage H, PA-C  ondansetron (ZOFRAN-ODT) 4 MG disintegrating tablet Take 1 tablet (4 mg total) by mouth every 6 (six) hours as needed for nausea or vomiting. 04/21/23  Yes Jenisha Faison H, PA-C  acetaminophen (TYLENOL) 500 MG tablet Take 500 mg by mouth every 6 (six) hours as needed for mild pain or moderate pain.    [provider]  amitriptyline (ELAVIL) 25 MG tablet Take 1 tablet (25 mg total) by mouth at bedtime. 01/31/23   Glean Salvo, NP  celecoxib (CELEBREX) 100 MG capsule Take 100 mg by mouth 2 (two) times daily. 01/21/23   [provider]  cholecalciferol (VITAMIN D3) 25 MCG (1000 UNIT) tablet Take 1,000 Units by mouth daily.    [provider]  cyanocobalamin (VITAMIN B12) 1000 MCG tablet Take 1 tablet (1,000 mcg  total) by mouth daily. 04/10/22   Rickard Patience, MD  gabapentin (NEURONTIN) 800 MG tablet Take 800 mg by mouth 3 (three) times daily.    [provider]  insulin aspart (NOVOLOG) 100 UNIT/ML FlexPen Before each meal 3 times a day, 140-199 - 2 units, 200-250 - 4 units, 251-299 - 8 units,  300-349 - 10 units,  350 or above 12 units. 12/27/22   Leroy Sea, MD  insulin glargine (LANTUS SOLOSTAR) 100 UNIT/ML Solostar Pen Inject 30 Units into the skin at bedtime. 12/27/22   Leroy Sea, MD  methocarbamol (ROBAXIN) 500 MG tablet Take 1 tablet (500 mg total) by mouth 2 (two) times daily. Patient taking differently: Take 500 mg by mouth at bedtime. 01/12/23   Jeannie Fend, PA-C  metoprolol tartrate (LOPRESSOR) 25 MG tablet Take 1 tablet (25 mg total) by mouth 2 (two) times daily. 03/27/23 04/26/23  Debarah Crape, DO  oxyCODONE-acetaminophen (PERCOCET/ROXICET) 5-325 MG tablet Take 1 tablet by mouth every 6 (six) hours as needed for moderate pain or severe pain. 03/13/21   [provider]  tiZANidine (ZANAFLEX) 4 MG tablet Take 4 mg by mouth at bedtime. 02/17/23   [provider]  Ubrogepant (UBRELVY) 100 MG TABS Take 1 tablet (100 mg total) by mouth as needed. Take 1 tablet at onset of headache, may repeat in 2 hours if needed. Max is 200 mg in 24 hours. 01/31/23   Glean Salvo, NP  vitamin C (ASCORBIC  ACID) 250 MG tablet Take 2 tablets (500 mg total) by mouth daily. Patient taking differently: Take 250 mg by mouth daily. 04/10/22   Rickard Patience, MD      Allergies    Trulicity [dulaglutide]    Review of Systems   Review of Systems  All other systems reviewed and are negative.   Physical Exam Updated Vital Signs BP (!) 148/83 (BP Location: Right Arm)   Pulse 92   Temp 98.1 F (36.7 C)   Resp 17   Ht 5\' 9"  (1.753 m)   Wt 93.4 kg   LMP 04/01/2023   SpO2 100%   BMI 30.42 kg/m  Physical Exam Vitals and nursing note reviewed.  Constitutional:      General: She is  not in acute distress.    Appearance: Normal appearance.  HENT:     Head: Normocephalic and atraumatic.  Eyes:     General:        Right eye: No discharge.        Left eye: No discharge.  Cardiovascular:     Rate and Rhythm: Normal rate and regular rhythm.     Pulses: Normal pulses.     Heart sounds: No murmur heard.    No friction rub. No gallop.  Pulmonary:     Effort: Pulmonary effort is normal.     Breath sounds: Normal breath sounds.  Abdominal:     General: Bowel sounds are normal.     Palpations: Abdomen is soft.     Comments: Mild tenderness to palpation in the epigastric region, no rebound, rigidity, guarding  Musculoskeletal:     Comments: I do not appreciate significant lower extremity edema on my exam  Skin:    General: Skin is warm and dry.     Capillary Refill: Capillary refill takes less than 2 seconds.  Neurological:     Mental Status: She is alert and oriented to person, place, and time.  Psychiatric:        Mood and Affect: Mood normal.        Behavior: Behavior normal.     ED Results / Procedures / Treatments   Labs (all labs ordered are listed, but only abnormal results are displayed) Labs Reviewed  CBC - Abnormal; Notable for the following components:      Result Value   RBC 3.07 (*)    Hemoglobin 8.6 (*)    HCT 26.7 (*)    RDW 19.4 (*)    Platelets 133 (*)    All other components within normal limits  BRAIN NATRIURETIC PEPTIDE - Abnormal; Notable for the following components:   B Natriuretic Peptide 238.5 (*)    All other components within normal limits  COMPREHENSIVE METABOLIC PANEL - Abnormal; Notable for the following components:   Potassium 5.5 (*)    Chloride 113 (*)    CO2 15 (*)    Glucose, Bld 275 (*)    BUN 21 (*)    Calcium 8.0 (*)    Total Protein 5.7 (*)    Albumin 2.8 (*)    All other components within normal limits  HCG, SERUM, QUALITATIVE  TROPONIN I (HIGH SENSITIVITY)  TROPONIN I (HIGH SENSITIVITY)     EKG None  Radiology DG Chest 2 View Result Date: 04/21/2023 CLINICAL DATA:  Bilateral lower extremity swelling. Abdominal swelling. Decreased urination. EXAM: CHEST - 2 VIEW COMPARISON:  03/25/2023 and CT chest 03/25/2023. FINDINGS: Trachea is midline. Heart is enlarged, stable. Mild perihilar interstitial prominence and nodularity,  likely due to postinfectious/postinflammatory scarring. No airspace consolidation or pleural fluid. IMPRESSION: No acute findings. Electronically Signed   By: Leanna Battles M.D.   On: 04/21/2023 12:59    Procedures Procedures    Medications Ordered in ED Medications  oxyCODONE-acetaminophen (PERCOCET/ROXICET) 5-325 MG per tablet 1 tablet (1 tablet Oral Given 04/21/23 1600)  furosemide (LASIX) tablet 40 mg (40 mg Oral Given 04/21/23 1600)  alum & mag hydroxide-simeth (MAALOX/MYLANTA) 200-200-20 MG/5ML suspension 30 mL (30 mLs Oral Given 04/21/23 1559)    And  lidocaine (XYLOCAINE) 2 % viscous mouth solution 15 mL (15 mLs Oral Given 04/21/23 1559)  ondansetron (ZOFRAN-ODT) disintegrating tablet 4 mg (4 mg Oral Given 04/21/23 1600)    ED Course/ Medical Decision Making/ A&P                                 Medical Decision Making Amount and/or Complexity of Data Reviewed Labs: ordered. Radiology: ordered.  Risk OTC drugs. Prescription drug management.   This patient is a 43 y.o. female  who presents to the ED for concern of shob, abd pain.   Differential diagnoses prior to evaluation: The emergent differential diagnosis includes, but is not limited to,  asthma exacerbation, COPD exacerbation, acute upper respiratory infection, acute bronchitis, chronic bronchitis, interstitial lung disease, ARDS, PE, pneumonia, atypical ACS, carbon monoxide poisoning, spontaneous pneumothorax, new CHF vs CHF exacerbation, versus other, The causes of generalized abdominal pain include but are not limited to AAA, mesenteric ischemia, appendicitis, diverticulitis, DKA,  gastritis, gastroenteritis, AMI, nephrolithiasis, pancreatitis, peritonitis, adrenal insufficiency,lead poisoning, iron toxicity, intestinal ischemia, constipation, UTI,SBO/LBO, splenic rupture, biliary disease, IBD, IBS, PUD, or hepatitis. This is not an exhaustive differential.   Past Medical History / Co-morbidities / Social History: chronic pain syndrome, lumbar stenosis, anemia, diabetes, tobacco use, arthritis  Additional history: Chart reviewed. Pertinent results include: Reviewed lab work, imaging from previous emergency department visits, echo from 2023  Physical Exam: Physical exam performed. The pertinent findings include: I do not appreciate significant swelling of extremities, she does have some mild tenderness palpation of the epigastric region, otherwise she is well-appearing  Lab Tests/Imaging studies: I personally interpreted labs/imaging and the pertinent results include: CBC notable for chronic anemia, hemoglobin 8.6, not significantly changed from baseline, mild thrombocytopenia, platelets 133.  Her BNP is elevated to 38.5 suggesting some degree of possible early CHF although no formal diagnosis yet.  Given some stiffness noted on previous echo she probably would benefit from cardiology follow-up.  She has normal kidney function on CMP, bicarb deficit, CO2 15, as well as a moderate hyperkalemia, potassium 5.5.  She is hyperglycemic with glucose of 275.Raynald Blend interpreted plain from chest x-ray which shows stable cardiomegaly, no evidence of acute intrathoracic abnormality.  I agree with the radiologist interpretation.  Cardiac monitoring: EKG obtained and interpreted by myself and attending physician which shows: Normal sinus rhythm, no acute ST-T changes.   Medications: I ordered medication including Lasix, Zofran, Percocet, GI cocktail, on reevaluation patient with some improvement of her symptoms, I think reasonable to discharge with a few days of Lasix, close  cardiology/PCP follow-up, nausea medication.  I have reviewed the patients home medicines and have made adjustments as needed.   Disposition: After consideration of the diagnostic results and the patients response to treatment, I feel that patient with nonspecific presentation today, possible early CHF with her elevated BNP, otherwise her workup is overall unremarkable other than her  hyperkalemia and hyper glycemia.  Encouraged home diabetes medications, close PCP/cardiology follow-up.Marland Kitchen   emergency department workup does not suggest an emergent condition requiring admission or immediate intervention beyond what has been performed at this time. The plan is: as above. The patient is safe for discharge and has been instructed to return immediately for worsening symptoms, change in symptoms or any other concerns.  Final Clinical Impression(s) / ED Diagnoses Final diagnoses:  Leg swelling  Hyperkalemia  Epigastric pain    Rx / DC Orders ED Discharge Orders          Ordered    furosemide (LASIX) 20 MG tablet  Daily        04/21/23 1716    ondansetron (ZOFRAN-ODT) 4 MG disintegrating tablet  Every 6 hours PRN        04/21/23 1716              Johany Hansman, Harrel Carina, PA-C 04/21/23 1716    Gwyneth Sprout, MD 04/21/23 2359

## 2023-04-21 NOTE — Discharge Instructions (Addendum)
Take the fluid pill that I prescribed for the next few days, you can use the nausea medication I prescribed as needed.  I would stick to a bland diet until your abdominal pain improves, I recommend that you follow-up closely with your primary care doctor and cardiologist to evaluate if any ongoing fluid persists.  Please return if you have significant worsening fluid, chest pain, shortness of breath.

## 2023-04-21 NOTE — ED Provider Triage Note (Signed)
Emergency Medicine Provider Triage Evaluation Note  Melinda Stone , a 43 y.o. female  was evaluated in triage.  Pt complains of shortness of breath.  Symptoms began 4 days ago.  Notes her bilateral legs are swelling and she feels swollen in her abdomen as well.  She is having to sleep elevated due to shortness of breath.  Denies any chest pain.  No cardiac history or history of heart failure.  Review of Systems  Positive:  Negative:   Physical Exam  Pulse 81   Temp 98.1 F (36.7 C) (Oral)   Resp 16   Ht 5\' 9"  (1.753 m)   Wt 93.4 kg   LMP 04/01/2023   SpO2 100%   BMI 30.42 kg/m  Gen:   Awake, no distress   Resp:  Normal effort  MSK:   Moves extremities without difficulty  Other:  Bilateral lower extremity edema  Medical Decision Making  Medically screening exam initiated at 11:41 AM.  Appropriate orders placed.  Melinda Stone was informed that the remainder of the evaluation will be completed by another provider, this initial triage assessment does not replace that evaluation, and the importance of remaining in the ED until their evaluation is complete.     Silva Bandy, PA-C 04/21/23 1143

## 2023-04-22 ENCOUNTER — Inpatient Hospital Stay: Payer: 59 | Attending: Oncology

## 2023-04-22 DIAGNOSIS — R634 Abnormal weight loss: Secondary | ICD-10-CM | POA: Diagnosis not present

## 2023-04-22 DIAGNOSIS — E538 Deficiency of other specified B group vitamins: Secondary | ICD-10-CM | POA: Insufficient documentation

## 2023-04-22 DIAGNOSIS — R110 Nausea: Secondary | ICD-10-CM | POA: Insufficient documentation

## 2023-04-22 DIAGNOSIS — D508 Other iron deficiency anemias: Secondary | ICD-10-CM

## 2023-04-22 DIAGNOSIS — F17290 Nicotine dependence, other tobacco product, uncomplicated: Secondary | ICD-10-CM | POA: Diagnosis not present

## 2023-04-22 DIAGNOSIS — D509 Iron deficiency anemia, unspecified: Secondary | ICD-10-CM | POA: Insufficient documentation

## 2023-04-22 LAB — IRON AND TIBC
Iron: 92 ug/dL (ref 28–170)
Saturation Ratios: 24 % (ref 10.4–31.8)
TIBC: 379 ug/dL (ref 250–450)
UIBC: 287 ug/dL

## 2023-04-22 LAB — CBC WITH DIFFERENTIAL (CANCER CENTER ONLY)
Abs Immature Granulocytes: 0.03 10*3/uL (ref 0.00–0.07)
Basophils Absolute: 0.1 10*3/uL (ref 0.0–0.1)
Basophils Relative: 1 %
Eosinophils Absolute: 0.2 10*3/uL (ref 0.0–0.5)
Eosinophils Relative: 3 %
HCT: 23.2 % — ABNORMAL LOW (ref 36.0–46.0)
Hemoglobin: 8 g/dL — ABNORMAL LOW (ref 12.0–15.0)
Immature Granulocytes: 0 %
Lymphocytes Relative: 22 %
Lymphs Abs: 1.5 10*3/uL (ref 0.7–4.0)
MCH: 27.9 pg (ref 26.0–34.0)
MCHC: 34.5 g/dL (ref 30.0–36.0)
MCV: 80.8 fL (ref 80.0–100.0)
Monocytes Absolute: 0.4 10*3/uL (ref 0.1–1.0)
Monocytes Relative: 6 %
Neutro Abs: 4.6 10*3/uL (ref 1.7–7.7)
Neutrophils Relative %: 68 %
Platelet Count: 138 10*3/uL — ABNORMAL LOW (ref 150–400)
RBC: 2.87 MIL/uL — ABNORMAL LOW (ref 3.87–5.11)
RDW: 18.8 % — ABNORMAL HIGH (ref 11.5–15.5)
WBC Count: 6.8 10*3/uL (ref 4.0–10.5)
nRBC: 0 % (ref 0.0–0.2)

## 2023-04-22 LAB — VITAMIN B12: Vitamin B-12: 284 pg/mL (ref 180–914)

## 2023-04-22 LAB — FERRITIN: Ferritin: 37 ng/mL (ref 11–307)

## 2023-04-22 LAB — FOLATE: Folate: 8.3 ng/mL (ref >5.9)

## 2023-04-23 ENCOUNTER — Inpatient Hospital Stay: Payer: 59

## 2023-04-23 ENCOUNTER — Encounter: Payer: Self-pay | Admitting: Oncology

## 2023-04-23 ENCOUNTER — Inpatient Hospital Stay (HOSPITAL_BASED_OUTPATIENT_CLINIC_OR_DEPARTMENT_OTHER): Payer: 59 | Admitting: Oncology

## 2023-04-23 VITALS — BP 138/7 | HR 86 | Temp 98.2°F | Resp 18 | Wt 219.2 lb

## 2023-04-23 VITALS — BP 140/78 | HR 83 | Temp 97.9°F

## 2023-04-23 DIAGNOSIS — E538 Deficiency of other specified B group vitamins: Secondary | ICD-10-CM | POA: Diagnosis not present

## 2023-04-23 DIAGNOSIS — D508 Other iron deficiency anemias: Secondary | ICD-10-CM

## 2023-04-23 DIAGNOSIS — D509 Iron deficiency anemia, unspecified: Secondary | ICD-10-CM | POA: Diagnosis not present

## 2023-04-23 DIAGNOSIS — Z23 Encounter for immunization: Secondary | ICD-10-CM

## 2023-04-23 DIAGNOSIS — D519 Vitamin B12 deficiency anemia, unspecified: Secondary | ICD-10-CM

## 2023-04-23 MED ORDER — SODIUM CHLORIDE 0.9% FLUSH
10.0000 mL | Freq: Once | INTRAVENOUS | Status: AC | PRN
  Administered 2023-04-23: 10 mL
  Filled 2023-04-23: qty 10

## 2023-04-23 MED ORDER — VITAMIN B-12 2500 MCG SL SUBL: 1.0000 | SUBLINGUAL_TABLET | Freq: Every day | SUBLINGUAL | 2 refills | Status: DC

## 2023-04-23 MED ORDER — IRON SUCROSE 20 MG/ML IV SOLN
200.0000 mg | Freq: Once | INTRAVENOUS | Status: AC
  Administered 2023-04-23: 200 mg via INTRAVENOUS
  Filled 2023-04-23: qty 10

## 2023-04-23 NOTE — Progress Notes (Signed)
 Hematology/Oncology Progress note [phone] [phone]            Patient Care Team: [name], NP as PCP - General (Nurse Practitioner) Babara Call, MD as Consulting Physician (Oncology)  ASSESSMENT & PLAN:   IDA (iron  deficiency anemia) Patient tolerates IV Venofer  treatments previously. Labs reviewed and discussed with patient. Lab Results  Component Value Date   HGB 8.0 (L) 04/22/2023   TIBC 379 04/22/2023   IRONPCTSAT 24 04/22/2023   FERRITIN 37 04/22/2023     Hemoglobin remains low, iron  panel has improved, could be falsely elevated due to recent infection.  Given that she continues to have heavy menses, recommend one dose of Venofer  empirically.    Low serum vitamin B12 S/p vitamin B12 injections, she did not tolerate due to muscle cramps.  Recommend switch to sublingual B12 1000mcg daily.      Orders Placed This Encounter  Procedures   CBC with Differential (Cancer Center Only)    Standing Status:   Future    Expected Date:   05/21/2023    Expiration Date:   04/22/2024   Iron  and TIBC    Standing Status:   Future    Expected Date:   05/21/2023    Expiration Date:   04/22/2024   Ferritin    Standing Status:   Future    Expected Date:   05/21/2023    Expiration Date:   04/22/2024   Retic Panel    Standing Status:   Future    Expected Date:   05/21/2023    Expiration Date:   04/22/2024   Technologist smear review    Standing Status:   Future    Expected Date:   05/21/2023    Expiration Date:   04/22/2024    Clinical information::   IDA   Vitamin B12    Standing Status:   Future    Expected Date:   05/21/2023    Expiration Date:   04/22/2024    Follow-up in 1 month(s) All questions were answered. The patient knows to call the clinic with any problems, questions or concerns.  Call Babara, MD, PhD [facility] Hematology Oncology 04/23/2023   CHIEF COMPLAINTS/REASON FOR VISIT:  iron  deficiency anemia  HISTORY OF PRESENTING ILLNESS:    Melinda Stone is a  43 y.o.  female presents for iron  deficiency anemia.  Patient had blood work done on 07/13/2021, CBC showed hemoglobin of 9.0, MCV 75.3, ferritin 14, iron  saturation 21, TIBC 455.  Reviewed her previous lab records.  Patient has chronic anemia since at least 2019.  + Nausea, no vomiting.  Chronic diarrhea after each meal.  Unintentional weight loss Patient reports that she has had colonoscopy done in May which did not reveal etiology of diarrhea.  Colonoscopy record was not available to me at the time of dictation. 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    INTERVAL HISTORY Melinda Stone is a 43 y.o. female who has above history reviewed by me today presents for follow up visit for iron  deficiency anemia Patient takes oral vitamin B12 supplementation at 1000 mcg daily. Chronic fatigue chronic diarrhea, unchanged.  Heavy menstrual period.  Denies hematochezia, hematuria, hematemesis, epistaxis, black tarry stool or easy bruising.  Recent admission due to sepsis, left fifth toe ulceration and cellulitis    Review of Systems  Constitutional:  Positive for fatigue. Negative for chills and fever.  HENT:   Negative for hearing loss and voice change.  Eyes:  Negative for eye problems.  Respiratory:  Negative for chest tightness and cough.   Cardiovascular:  Negative for chest pain.  Gastrointestinal:  Positive for diarrhea. Negative for abdominal distention, abdominal pain, blood in stool, nausea and vomiting.  Endocrine: Negative for hot flashes.  Genitourinary:  Negative for difficulty urinating and frequency.   Musculoskeletal:  Positive for back pain. Negative for arthralgias.  Skin:  Negative for itching and rash.  Neurological:  Positive for numbness. Negative for extremity weakness.  Hematological:  Negative for adenopathy.  Psychiatric/Behavioral:  Negative for confusion.     MEDICAL HISTORY:   Past Medical History:  Diagnosis Date   Anemia    Arthritis 08/17/2020   Chronic back pain    Diabetes mellitus without complication (HCC)    type 2   GERD (gastroesophageal reflux disease)    Migraine    Ovarian cyst    Pneumonia    Polyneuropathy     SURGICAL HISTORY: Past Surgical History:  Procedure Laterality Date   CESAREAN SECTION     CHOLECYSTECTOMY  2004   LUMBAR LAMINECTOMY N/A 05/30/2020   Procedure: Lumbar five Laminectomy,  Bilateral Microdiscectomy, Left Lumbar five -Sacral one Microdiscectomy;  Surgeon: Barbarann Oneil BROCKS, MD;  Location: [facility] OR;  Service: Orthopedics;  Laterality: N/A;   LUMBAR LAMINECTOMY Left 11/14/2020   Procedure: LEFT LUMBAR FOUR-FIVE MICRODISCECTOMY;  Surgeon: Barbarann Oneil BROCKS, MD;  Location: [facility] OR;  Service: Orthopedics;  Laterality: Left;   TUBAL LIGATION  10/19/2010    SOCIAL HISTORY: Social History   Socioeconomic History   Marital status: Single    Spouse name: Not on file   Number of children: 3   Years of education: Not on file   Highest education level: High school graduate  Occupational History    Comment: home maker  Tobacco Use   Smoking status: Every Day    Types: Cigars    Last attempt to quit: 12/23/2021    Years since quitting: 1.3   Smokeless tobacco: Never   Tobacco comments:    2 cigars daily. Khj 02/12/2023  Vaping Use   Vaping status: Never Used  Substance and Sexual Activity   Alcohol  use: Not Currently   Drug use: Not Currently    Comment: per patient stopped smoking marijuana Mar 2022   Sexual activity: Yes    Birth control/protection: Surgical  Other Topics Concern   Not on file  Social History Narrative   Lives with 3 children   Some coffee   Social Drivers of Corporate Investment Banker Strain: Not on file  Food Insecurity: No Food Insecurity (03/28/2023)   Hunger Vital Sign    Worried About Running Out of Food in the Last Year: Never true    Ran Out of Food in the Last Year: Never true   Transportation Needs: No Transportation Needs (03/28/2023)   PRAPARE - Administrator, Civil Service (Medical): No    Lack of Transportation (Non-Medical): No  Physical Activity: Not on file  Stress: Not on file  Social Connections: Socially Isolated (03/26/2023)   Social Connection and Isolation Panel [NHANES]    Frequency of Communication with Friends and Family: Never    Frequency of Social Gatherings with Friends and Family: Never    Attends Religious Services: Never    Database Administrator or Organizations: No    Attends Banker Meetings: Never    Marital Status: Never married  Intimate Partner Violence: Not At Risk (03/28/2023)  Humiliation, Afraid, Rape, and Kick questionnaire    Fear of Current or Ex-Partner: No    Emotionally Abused: No    Physically Abused: No    Sexually Abused: No    FAMILY HISTORY: Family History  Problem Relation Age of Onset   Diabetes Mother    Hypertension Mother    Stroke Mother     ALLERGIES:  is allergic to trulicity  [dulaglutide ].  MEDICATIONS:  Current Outpatient Medications  Medication Sig Dispense Refill   acetaminophen  (TYLENOL ) 500 MG tablet Take 500 mg by mouth every 6 (six) hours as needed for mild pain or moderate pain.     amitriptyline  (ELAVIL ) 25 MG tablet Take 1 tablet (25 mg total) by mouth at bedtime. 30 tablet 6   celecoxib  (CELEBREX ) 100 MG capsule Take 100 mg by mouth 2 (two) times daily.     cholecalciferol (VITAMIN D3) 25 MCG (1000 UNIT) tablet Take 1,000 Units by mouth daily.     Cyanocobalamin  (VITAMIN B-12) 2500 MCG SUBL Place 1 tablet (2,500 mcg total) under the tongue daily. 30 tablet 2   furosemide  (LASIX ) 20 MG tablet Take 1 tablet (20 mg total) by mouth daily. 6 tablet 0   gabapentin  (NEURONTIN ) 800 MG tablet Take 800 mg by mouth 3 (three) times daily.     insulin  aspart (NOVOLOG ) 100 UNIT/ML FlexPen Before each meal 3 times a day, 140-199 - 2 units, 200-250 - 4 units, 251-299 - 8 units,   300-349 - 10 units,  350 or above 12 units. 15 mL 0   insulin  glargine (LANTUS  SOLOSTAR) 100 UNIT/ML Solostar Pen Inject 30 Units into the skin at bedtime. 9 mL 0   methocarbamol  (ROBAXIN ) 500 MG tablet Take 1 tablet (500 mg total) by mouth 2 (two) times daily. (Patient taking differently: Take 500 mg by mouth at bedtime.) 20 tablet 0   metoprolol  tartrate (LOPRESSOR ) 25 MG tablet Take 1 tablet (25 mg total) by mouth 2 (two) times daily. 60 tablet 0   ondansetron  (ZOFRAN -ODT) 4 MG disintegrating tablet Take 1 tablet (4 mg total) by mouth every 6 (six) hours as needed for nausea or vomiting. 20 tablet 0   oxyCODONE -acetaminophen  (PERCOCET/ROXICET) 5-325 MG tablet Take 1 tablet by mouth every 6 (six) hours as needed for moderate pain or severe pain.     tiZANidine  (ZANAFLEX ) 4 MG tablet Take 4 mg by mouth at bedtime.     Ubrogepant  (UBRELVY ) 100 MG TABS Take 1 tablet (100 mg total) by mouth as needed. Take 1 tablet at onset of headache, may repeat in 2 hours if needed. Max is 200 mg in 24 hours. 12 tablet 11   vitamin C  (ASCORBIC ACID ) 250 MG tablet Take 2 tablets (500 mg total) by mouth daily. (Patient taking differently: Take 250 mg by mouth daily.) 180 tablet 0   No current facility-administered medications for this visit.     PHYSICAL EXAMINATION: ECOG PERFORMANCE STATUS: 2 - Symptomatic, <50% confined to bed Vitals:   04/23/23 1303  BP: (!) 138/7  Pulse: 86  Resp: 18  Temp: 98.2 F (36.8 C)  SpO2: 100%   Filed Weights   04/23/23 1303  Weight: 219 lb 3.2 oz (99.4 kg)      Physical Exam Constitutional:      General: She is not in acute distress.    Comments: Patient sits in the wheelchair  HENT:     Head: Normocephalic and atraumatic.  Eyes:     General: No scleral icterus. Cardiovascular:  Rate and Rhythm: Normal rate and regular rhythm.     Heart sounds: Normal heart sounds.  Pulmonary:     Effort: Pulmonary effort is normal. No respiratory distress.     Breath  sounds: No wheezing.  Abdominal:     General: Bowel sounds are normal. There is no distension.     Palpations: Abdomen is soft.  Musculoskeletal:        General: No deformity. Normal range of motion.     Cervical back: Normal range of motion and neck supple.  Skin:    General: Skin is warm and dry.  Neurological:     Mental Status: She is alert and oriented to person, place, and time. Mental status is at baseline.     Cranial Nerves: No cranial nerve deficit.     Coordination: Coordination normal.  Psychiatric:        Mood and Affect: Mood normal.     LABORATORY DATA:  I have reviewed the data as listed    Latest Ref Rng & Units 04/22/2023   11:19 AM 04/21/2023   11:47 AM 03/27/2023   11:19 AM  CBC  WBC 4.0 - 10.5 K/uL 6.8  7.2    Hemoglobin 12.0 - 15.0 g/dL 8.0  8.6  7.5   Hematocrit 36.0 - 46.0 % 23.2  26.7  22.0   Platelets 150 - 400 K/uL 138  133        Latest Ref Rng & Units 04/21/2023   11:47 AM 03/27/2023    4:40 AM 03/26/2023    5:05 AM  CMP  Glucose 70 - 99 mg/dL 724  640  711   BUN 6 - 20 mg/dL 21  16  15    Creatinine 0.44 - 1.00 mg/dL 9.06  9.11  9.27   Sodium 135 - 145 mmol/L 135  133  132   Potassium 3.5 - 5.1 mmol/L 5.5  4.2  4.0   Chloride 98 - 111 mmol/L 113  105  107   CO2 22 - 32 mmol/L 15  20  18    Calcium  8.9 - 10.3 mg/dL 8.0  8.2  8.0   Total Protein 6.5 - 8.1 g/dL 5.7     Total Bilirubin 0.0 - 1.2 mg/dL 1.1     Alkaline Phos 38 - 126 U/L 102     AST 15 - 41 U/L 18     ALT 0 - 44 U/L 10        Iron /TIBC/Ferritin/ %Sat    Component Value Date/Time   IRON  92 04/22/2023 1119   IRON  64 01/02/2019 0955   TIBC 379 04/22/2023 1119   TIBC 382 01/02/2019 0955   FERRITIN 37 04/22/2023 1119   FERRITIN 21 01/02/2019 0955   IRONPCTSAT 24 04/22/2023 1119   IRONPCTSAT 17 01/02/2019 0955      RADIOGRAPHIC STUDIES: I have personally reviewed the radiological images as listed and agreed with the findings in the report. DG Chest 2 View Result Date:  04/21/2023 CLINICAL DATA:  Bilateral lower extremity swelling. Abdominal swelling. Decreased urination. EXAM: CHEST - 2 VIEW COMPARISON:  03/25/2023 and CT chest 03/25/2023. FINDINGS: Trachea is midline. Heart is enlarged, stable. Mild perihilar interstitial prominence and nodularity, likely due to postinfectious/postinflammatory scarring. No airspace consolidation or pleural fluid. IMPRESSION: No acute findings. Electronically Signed   By: Newell Eke M.D.   On: 04/21/2023 12:59   DG Foot Complete Left Result Date: 03/25/2023 CLINICAL DATA:  Fifth toe infected wound. EXAM: LEFT FOOT - COMPLETE 3+  VIEW COMPARISON:  None Available. FINDINGS: There is no evidence of fracture or dislocation. There is no evidence of arthropathy or other focal bone abnormality. There is diffuse soft tissue swelling of the forefoot and fifth toe. IMPRESSION: Diffuse soft tissue swelling of the forefoot and fifth toe. No acute osseous abnormality. Electronically Signed   By: Greig Pique M.D.   On: 03/25/2023 18:31   CT Angio Chest PE W and/or Wo Contrast Result Date: 03/25/2023 CLINICAL DATA:  Chest pain, shortness of breath and headache. EXAM: CT ANGIOGRAPHY CHEST WITH CONTRAST TECHNIQUE: Multidetector CT imaging of the chest was performed using the standard protocol during bolus administration of intravenous contrast. Multiplanar CT image reconstructions and MIPs were obtained to evaluate the vascular anatomy. RADIATION DOSE REDUCTION: This exam was performed according to the departmental dose-optimization program which includes automated exposure control, adjustment of the mA and/or kV according to patient size and/or use of iterative reconstruction technique. CONTRAST:  75mL OMNIPAQUE  IOHEXOL  350 MG/ML SOLN COMPARISON:  12/27/2021. FINDINGS: Cardiovascular: Negative for pulmonary embolus. Aberrant right subclavian artery. Enlarged pulmonic trunk and heart. No pericardial effusion. Mediastinum/Nodes: Similar small to borderline  enlarged mediastinal lymph nodes. Bihilar adenopathy measures up to approximately 1.6 cm on the right, difficult to compare with 12/27/2021 due to lack of IV contrast on that study. No axillary adenopathy. Air-filled and mildly dilated upper esophagus. Lungs/Pleura: New patchy upper and midlung zone predominant peribronchovascular ground-glass. Tiny bilateral pleural effusions. Airway is unremarkable. Upper Abdomen: Spleen may be enlarged but is incompletely imaged. Visualized portions of the liver, adrenal glands, kidneys, spleen, pancreas, stomach and bowel are otherwise grossly unremarkable. No upper abdominal adenopathy. Musculoskeletal: None. Review of the MIP images confirms the above findings. IMPRESSION: 1. Negative for pulmonary embolus. 2. Patchy upper mid lung zone predominant peribronchovascular ground-glass, indicative an atypical or viral pneumonia. 3. Tiny bilateral 4. Chronic mild mediastinal and hilar adenopathy. Possibility of sarcoid is not excluded. Pleural effusions. 5. Spleen may be enlarged but is incompletely imaged. 6. Enlarged pulmonic trunk, indicative of pulmonary arterial hypertension. Electronically Signed   By: Newell Eke M.D.   On: 03/25/2023 18:15   DG Chest 2 View Result Date: 03/25/2023 CLINICAL DATA:  Chest pain and shortness of breath. EXAM: CHEST - 2 VIEW COMPARISON:  02/04/2023 FINDINGS: Heart size is within normal limits. Multifocal ill-defined nodular opacities are seen bilaterally which are new since previous study. No evidence of pleural effusion. IMPRESSION: Multifocal ill-defined bilateral pulmonary nodular opacities , which may be due to metastatic disease or infectious/inflammatory etiologies. Chest CT with contrast is recommended for further evaluation. Electronically Signed   By: Norleen DELENA Kil M.D.   On: 03/25/2023 10:42

## 2023-04-23 NOTE — Assessment & Plan Note (Addendum)
 Patient tolerates IV Venofer  treatments previously. Labs reviewed and discussed with patient. Lab Results  Component Value Date   HGB 8.0 (L) 04/22/2023   TIBC 379 04/22/2023   IRONPCTSAT 24 04/22/2023   FERRITIN 37 04/22/2023     Hemoglobin remains low, iron  panel has improved, could be falsely elevated due to recent infection.  Given that she continues to have heavy menses, recommend one dose of Venofer  empirically.

## 2023-04-23 NOTE — Assessment & Plan Note (Addendum)
S/p vitamin B12 injections, she did not tolerate due to muscle cramps.  Recommend switch to sublingual B12 daily.

## 2023-04-24 ENCOUNTER — Telehealth: Payer: Self-pay

## 2023-04-24 NOTE — Telephone Encounter (Signed)
 Returned call, pt called to schedule imaging and was advised to call when her cycle started. Advised pt that I would notify schedulers and someone will follow up

## 2023-04-26 ENCOUNTER — Telehealth (HOSPITAL_BASED_OUTPATIENT_CLINIC_OR_DEPARTMENT_OTHER): Payer: Self-pay | Admitting: *Deleted

## 2023-04-26 NOTE — Telephone Encounter (Signed)
 Called pt to set up appt for shg. Pt reports that her cycle started on 04/23/23, next cycle should start around 05/21/23. Pt provided with appt for [facility] on 05/29/23. Advised no intercourse for 2 weeks prior to the procedure. Pt verbalized understanding.

## 2023-04-26 NOTE — Telephone Encounter (Signed)
-----   Message from [place] K sent at 04/24/2023 10:17 AM EST ----- Good morning,   Can we please tentatively schedule sono for March for pt. She starts her cycle the 4th of each Month. We just trying to avoid her being missed next cycle. Thanks.

## 2023-05-02 ENCOUNTER — Encounter: Payer: Self-pay | Admitting: Oncology

## 2023-05-09 ENCOUNTER — Other Ambulatory Visit (HOSPITAL_COMMUNITY): Payer: Self-pay

## 2023-05-11 ENCOUNTER — Encounter (HOSPITAL_COMMUNITY): Payer: Self-pay | Admitting: *Deleted

## 2023-05-11 ENCOUNTER — Inpatient Hospital Stay (HOSPITAL_COMMUNITY): Payer: Medicaid Other

## 2023-05-11 ENCOUNTER — Other Ambulatory Visit: Payer: Self-pay

## 2023-05-11 ENCOUNTER — Emergency Department (HOSPITAL_COMMUNITY): Payer: Medicaid Other

## 2023-05-11 ENCOUNTER — Inpatient Hospital Stay (HOSPITAL_COMMUNITY)
Admission: EM | Admit: 2023-05-11 | Discharge: 2023-05-23 | DRG: 870 | Disposition: A | Discharge status: Rehabilitation Facility | Payer: Medicaid Other | Attending: Internal Medicine | Admitting: Internal Medicine

## 2023-05-11 DIAGNOSIS — Z823 Family history of stroke: Secondary | ICD-10-CM

## 2023-05-11 DIAGNOSIS — J80 Acute respiratory distress syndrome: Secondary | ICD-10-CM

## 2023-05-11 DIAGNOSIS — D500 Iron deficiency anemia secondary to blood loss (chronic): Secondary | ICD-10-CM | POA: Diagnosis not present

## 2023-05-11 DIAGNOSIS — R112 Nausea with vomiting, unspecified: Secondary | ICD-10-CM | POA: Diagnosis present

## 2023-05-11 DIAGNOSIS — D509 Iron deficiency anemia, unspecified: Secondary | ICD-10-CM | POA: Diagnosis present

## 2023-05-11 DIAGNOSIS — G9341 Metabolic encephalopathy: Secondary | ICD-10-CM | POA: Diagnosis not present

## 2023-05-11 DIAGNOSIS — J4531 Mild persistent asthma with (acute) exacerbation: Secondary | ICD-10-CM | POA: Diagnosis present

## 2023-05-11 DIAGNOSIS — E1165 Type 2 diabetes mellitus with hyperglycemia: Secondary | ICD-10-CM | POA: Diagnosis not present

## 2023-05-11 DIAGNOSIS — R739 Hyperglycemia, unspecified: Secondary | ICD-10-CM | POA: Diagnosis not present

## 2023-05-11 DIAGNOSIS — Z6833 Body mass index (BMI) 33.0-33.9, adult: Secondary | ICD-10-CM

## 2023-05-11 DIAGNOSIS — J189 Pneumonia, unspecified organism: Principal | ICD-10-CM

## 2023-05-11 DIAGNOSIS — Z79899 Other long term (current) drug therapy: Secondary | ICD-10-CM

## 2023-05-11 DIAGNOSIS — I10 Essential (primary) hypertension: Secondary | ICD-10-CM | POA: Diagnosis present

## 2023-05-11 DIAGNOSIS — J1008 Influenza due to other identified influenza virus with other specified pneumonia: Secondary | ICD-10-CM | POA: Diagnosis present

## 2023-05-11 DIAGNOSIS — J9600 Acute respiratory failure, unspecified whether with hypoxia or hypercapnia: Secondary | ICD-10-CM | POA: Diagnosis not present

## 2023-05-11 DIAGNOSIS — R5381 Other malaise: Secondary | ICD-10-CM

## 2023-05-11 DIAGNOSIS — D6959 Other secondary thrombocytopenia: Secondary | ICD-10-CM | POA: Diagnosis present

## 2023-05-11 DIAGNOSIS — J159 Unspecified bacterial pneumonia: Secondary | ICD-10-CM | POA: Diagnosis present

## 2023-05-11 DIAGNOSIS — Z794 Long term (current) use of insulin: Secondary | ICD-10-CM | POA: Diagnosis not present

## 2023-05-11 DIAGNOSIS — G4700 Insomnia, unspecified: Secondary | ICD-10-CM | POA: Diagnosis present

## 2023-05-11 DIAGNOSIS — R6521 Severe sepsis with septic shock: Secondary | ICD-10-CM | POA: Diagnosis not present

## 2023-05-11 DIAGNOSIS — N170 Acute kidney failure with tubular necrosis: Secondary | ICD-10-CM | POA: Diagnosis not present

## 2023-05-11 DIAGNOSIS — I071 Rheumatic tricuspid insufficiency: Secondary | ICD-10-CM | POA: Diagnosis present

## 2023-05-11 DIAGNOSIS — M19072 Primary osteoarthritis, left ankle and foot: Secondary | ICD-10-CM | POA: Diagnosis not present

## 2023-05-11 DIAGNOSIS — J45901 Unspecified asthma with (acute) exacerbation: Secondary | ICD-10-CM | POA: Diagnosis not present

## 2023-05-11 DIAGNOSIS — Z1152 Encounter for screening for COVID-19: Secondary | ICD-10-CM | POA: Diagnosis not present

## 2023-05-11 DIAGNOSIS — R1312 Dysphagia, oropharyngeal phase: Secondary | ICD-10-CM | POA: Diagnosis present

## 2023-05-11 DIAGNOSIS — Z9911 Dependence on respirator [ventilator] status: Secondary | ICD-10-CM

## 2023-05-11 DIAGNOSIS — E870 Hyperosmolality and hypernatremia: Secondary | ICD-10-CM | POA: Diagnosis present

## 2023-05-11 DIAGNOSIS — R109 Unspecified abdominal pain: Secondary | ICD-10-CM | POA: Diagnosis present

## 2023-05-11 DIAGNOSIS — E11649 Type 2 diabetes mellitus with hypoglycemia without coma: Secondary | ICD-10-CM

## 2023-05-11 DIAGNOSIS — J1001 Influenza due to other identified influenza virus with the same other identified influenza virus pneumonia: Secondary | ICD-10-CM | POA: Diagnosis present

## 2023-05-11 DIAGNOSIS — G43909 Migraine, unspecified, not intractable, without status migrainosus: Secondary | ICD-10-CM | POA: Diagnosis present

## 2023-05-11 DIAGNOSIS — J4521 Mild intermittent asthma with (acute) exacerbation: Secondary | ICD-10-CM | POA: Diagnosis present

## 2023-05-11 DIAGNOSIS — I509 Heart failure, unspecified: Secondary | ICD-10-CM | POA: Diagnosis not present

## 2023-05-11 DIAGNOSIS — D649 Anemia, unspecified: Secondary | ICD-10-CM | POA: Diagnosis present

## 2023-05-11 DIAGNOSIS — J101 Influenza due to other identified influenza virus with other respiratory manifestations: Secondary | ICD-10-CM | POA: Diagnosis not present

## 2023-05-11 DIAGNOSIS — Z7984 Long term (current) use of oral hypoglycemic drugs: Secondary | ICD-10-CM

## 2023-05-11 DIAGNOSIS — E872 Acidosis, unspecified: Secondary | ICD-10-CM | POA: Diagnosis present

## 2023-05-11 DIAGNOSIS — K219 Gastro-esophageal reflux disease without esophagitis: Secondary | ICD-10-CM | POA: Diagnosis present

## 2023-05-11 DIAGNOSIS — R6520 Severe sepsis without septic shock: Secondary | ICD-10-CM | POA: Diagnosis present

## 2023-05-11 DIAGNOSIS — F419 Anxiety disorder, unspecified: Secondary | ICD-10-CM | POA: Diagnosis present

## 2023-05-11 DIAGNOSIS — E781 Pure hyperglyceridemia: Secondary | ICD-10-CM | POA: Diagnosis present

## 2023-05-11 DIAGNOSIS — A419 Sepsis, unspecified organism: Secondary | ICD-10-CM | POA: Diagnosis not present

## 2023-05-11 DIAGNOSIS — E1142 Type 2 diabetes mellitus with diabetic polyneuropathy: Secondary | ICD-10-CM | POA: Diagnosis not present

## 2023-05-11 DIAGNOSIS — J811 Chronic pulmonary edema: Secondary | ICD-10-CM | POA: Diagnosis present

## 2023-05-11 DIAGNOSIS — R197 Diarrhea, unspecified: Secondary | ICD-10-CM | POA: Diagnosis present

## 2023-05-11 DIAGNOSIS — E11319 Type 2 diabetes mellitus with unspecified diabetic retinopathy without macular edema: Secondary | ICD-10-CM | POA: Diagnosis present

## 2023-05-11 DIAGNOSIS — Z6831 Body mass index (BMI) 31.0-31.9, adult: Secondary | ICD-10-CM | POA: Diagnosis not present

## 2023-05-11 DIAGNOSIS — F39 Unspecified mood [affective] disorder: Secondary | ICD-10-CM | POA: Diagnosis present

## 2023-05-11 DIAGNOSIS — R1114 Bilious vomiting: Secondary | ICD-10-CM | POA: Diagnosis not present

## 2023-05-11 DIAGNOSIS — A4189 Other specified sepsis: Principal | ICD-10-CM | POA: Diagnosis present

## 2023-05-11 DIAGNOSIS — Z888 Allergy status to other drugs, medicaments and biological substances status: Secondary | ICD-10-CM

## 2023-05-11 DIAGNOSIS — D62 Acute posthemorrhagic anemia: Secondary | ICD-10-CM | POA: Diagnosis not present

## 2023-05-11 DIAGNOSIS — J09X1 Influenza due to identified novel influenza A virus with pneumonia: Secondary | ICD-10-CM | POA: Diagnosis not present

## 2023-05-11 DIAGNOSIS — E66811 Obesity, class 1: Secondary | ICD-10-CM | POA: Diagnosis present

## 2023-05-11 DIAGNOSIS — J9601 Acute respiratory failure with hypoxia: Secondary | ICD-10-CM | POA: Diagnosis not present

## 2023-05-11 DIAGNOSIS — N179 Acute kidney failure, unspecified: Secondary | ICD-10-CM | POA: Diagnosis not present

## 2023-05-11 DIAGNOSIS — I2699 Other pulmonary embolism without acute cor pulmonale: Secondary | ICD-10-CM | POA: Diagnosis not present

## 2023-05-11 DIAGNOSIS — I951 Orthostatic hypotension: Secondary | ICD-10-CM | POA: Diagnosis not present

## 2023-05-11 DIAGNOSIS — F05 Delirium due to known physiological condition: Secondary | ICD-10-CM | POA: Diagnosis not present

## 2023-05-11 DIAGNOSIS — Z9049 Acquired absence of other specified parts of digestive tract: Secondary | ICD-10-CM

## 2023-05-11 DIAGNOSIS — K760 Fatty (change of) liver, not elsewhere classified: Secondary | ICD-10-CM | POA: Diagnosis present

## 2023-05-11 DIAGNOSIS — G894 Chronic pain syndrome: Secondary | ICD-10-CM | POA: Diagnosis present

## 2023-05-11 DIAGNOSIS — R7402 Elevation of levels of lactic acid dehydrogenase (LDH): Secondary | ICD-10-CM | POA: Diagnosis present

## 2023-05-11 DIAGNOSIS — Z833 Family history of diabetes mellitus: Secondary | ICD-10-CM

## 2023-05-11 DIAGNOSIS — Z8249 Family history of ischemic heart disease and other diseases of the circulatory system: Secondary | ICD-10-CM

## 2023-05-11 DIAGNOSIS — F17290 Nicotine dependence, other tobacco product, uncomplicated: Secondary | ICD-10-CM | POA: Diagnosis present

## 2023-05-11 DIAGNOSIS — E876 Hypokalemia: Secondary | ICD-10-CM | POA: Diagnosis not present

## 2023-05-11 DIAGNOSIS — E875 Hyperkalemia: Secondary | ICD-10-CM | POA: Diagnosis present

## 2023-05-11 LAB — HEPATIC FUNCTION PANEL
ALT: 28 U/L (ref 0–44)
AST: 32 U/L (ref 15–41)
Albumin: 2.8 g/dL — ABNORMAL LOW (ref 3.5–5.0)
Alkaline Phosphatase: 130 U/L — ABNORMAL HIGH (ref 38–126)
Bilirubin, Direct: 1.3 mg/dL — ABNORMAL HIGH (ref 0.0–0.2)
Indirect Bilirubin: 1.4 mg/dL — ABNORMAL HIGH (ref 0.3–0.9)
Total Bilirubin: 2.7 mg/dL — ABNORMAL HIGH (ref 0.0–1.2)
Total Protein: 5.8 g/dL — ABNORMAL LOW (ref 6.5–8.1)

## 2023-05-11 LAB — CBC WITH DIFFERENTIAL/PLATELET
Abs Immature Granulocytes: 0.11 10*3/uL — ABNORMAL HIGH (ref 0.00–0.07)
Basophils Absolute: 0 10*3/uL (ref 0.0–0.1)
Basophils Relative: 1 %
Eosinophils Absolute: 0 10*3/uL (ref 0.0–0.5)
Eosinophils Relative: 0 %
HCT: 18.8 % — ABNORMAL LOW (ref 36.0–46.0)
Hemoglobin: 6.4 g/dL — CL (ref 12.0–15.0)
Immature Granulocytes: 1 %
Lymphocytes Relative: 4 %
Lymphs Abs: 0.4 10*3/uL — ABNORMAL LOW (ref 0.7–4.0)
MCH: 27.8 pg (ref 26.0–34.0)
MCHC: 34 g/dL (ref 30.0–36.0)
MCV: 81.7 fL (ref 80.0–100.0)
Monocytes Absolute: 0.3 10*3/uL (ref 0.1–1.0)
Monocytes Relative: 4 %
Neutro Abs: 7.5 10*3/uL (ref 1.7–7.7)
Neutrophils Relative %: 90 %
Platelets: 113 10*3/uL — ABNORMAL LOW (ref 150–400)
RBC: 2.3 MIL/uL — ABNORMAL LOW (ref 3.87–5.11)
RDW: 19.9 % — ABNORMAL HIGH (ref 11.5–15.5)
WBC: 8.4 10*3/uL (ref 4.0–10.5)
nRBC: 0.2 % (ref 0.0–0.2)

## 2023-05-11 LAB — COMPREHENSIVE METABOLIC PANEL
ALT: 25 U/L (ref 0–44)
ALT: 28 U/L (ref 0–44)
AST: 25 U/L (ref 15–41)
AST: 27 U/L (ref 15–41)
Albumin: 3 g/dL — ABNORMAL LOW (ref 3.5–5.0)
Albumin: 2.9 g/dL — ABNORMAL LOW (ref 3.5–5.0)
Alkaline Phosphatase: 100 U/L (ref 38–126)
Alkaline Phosphatase: 125 U/L (ref 38–126)
Anion gap: 7 (ref 5–15)
Anion gap: 6 (ref 5–15)
BUN: 17 mg/dL (ref 6–20)
BUN: 18 mg/dL (ref 6–20)
CO2: 18 mmol/L — ABNORMAL LOW (ref 22–32)
CO2: 17 mmol/L — ABNORMAL LOW (ref 22–32)
Calcium: 8.3 mg/dL — ABNORMAL LOW (ref 8.9–10.3)
Calcium: 8.2 mg/dL — ABNORMAL LOW (ref 8.9–10.3)
Chloride: 112 mmol/L — ABNORMAL HIGH (ref 98–111)
Chloride: 114 mmol/L — ABNORMAL HIGH (ref 98–111)
Creatinine, Ser: 1.04 mg/dL — ABNORMAL HIGH (ref 0.44–1.00)
Creatinine, Ser: 1.07 mg/dL — ABNORMAL HIGH (ref 0.44–1.00)
GFR, Estimated: >60 mL/min (ref >60)
GFR, Estimated: >60 mL/min (ref >60)
Glucose, Bld: 280 mg/dL — ABNORMAL HIGH (ref 70–99)
Glucose, Bld: 284 mg/dL — ABNORMAL HIGH (ref 70–99)
Potassium: 5.4 mmol/L — ABNORMAL HIGH (ref 3.5–5.1)
Potassium: 5.2 mmol/L — ABNORMAL HIGH (ref 3.5–5.1)
Sodium: 137 mmol/L (ref 135–145)
Sodium: 137 mmol/L (ref 135–145)
Total Bilirubin: 3.2 mg/dL — ABNORMAL HIGH (ref 0.0–1.2)
Total Bilirubin: 3.2 mg/dL — ABNORMAL HIGH (ref 0.0–1.2)
Total Protein: 5.9 g/dL — ABNORMAL LOW (ref 6.5–8.1)
Total Protein: 6 g/dL — ABNORMAL LOW (ref 6.5–8.1)

## 2023-05-11 LAB — RESPIRATORY PANEL BY PCR

## 2023-05-11 LAB — BLOOD GAS, ARTERIAL
Acid-base deficit: 9.3 mmol/L — ABNORMAL HIGH (ref 0.0–2.0)
Acid-base deficit: 9.2 mmol/L — ABNORMAL HIGH (ref 0.0–2.0)
Bicarbonate: 16.3 mmol/L — ABNORMAL LOW (ref 20.0–28.0)
Bicarbonate: 15.1 mmol/L — ABNORMAL LOW (ref 20.0–28.0)
O2 Saturation: 64.3 %
O2 Saturation: 99 %
Patient temperature: 37.1
Patient temperature: 37
pCO2 arterial: 34 mm[Hg] (ref 32–48)
pCO2 arterial: 28 mm[Hg] — ABNORMAL LOW (ref 32–48)
pH, Arterial: 7.29 — ABNORMAL LOW (ref 7.35–7.45)
pH, Arterial: 7.34 — ABNORMAL LOW (ref 7.35–7.45)
pO2, Arterial: 34 mm[Hg] — CL (ref 83–108)
pO2, Arterial: 104 mm[Hg] (ref 83–108)

## 2023-05-11 LAB — CBC
HCT: 20.3 % — ABNORMAL LOW (ref 36.0–46.0)
Hemoglobin: 6.9 g/dL — CL (ref 12.0–15.0)
MCH: 28.3 pg (ref 26.0–34.0)
MCHC: 34 g/dL (ref 30.0–36.0)
MCV: 83.2 fL (ref 80.0–100.0)
Platelets: 138 10*3/uL — ABNORMAL LOW (ref 150–400)
RBC: 2.44 MIL/uL — ABNORMAL LOW (ref 3.87–5.11)
RDW: 19.9 % — ABNORMAL HIGH (ref 11.5–15.5)
WBC: 8.4 10*3/uL (ref 4.0–10.5)
nRBC: 0 % (ref 0.0–0.2)

## 2023-05-11 LAB — LACTIC ACID, PLASMA
Lactic Acid, Venous: 1.8 mmol/L (ref 0.5–1.9)
Lactic Acid, Venous: 2 mmol/L (ref 0.5–1.9)

## 2023-05-11 LAB — VITAMIN B12: Vitamin B-12: 921 pg/mL — ABNORMAL HIGH (ref 180–914)

## 2023-05-11 LAB — URINALYSIS, ROUTINE W REFLEX MICROSCOPIC
Bilirubin Urine: NEGATIVE
Glucose, UA: 50 mg/dL — AB
Ketones, ur: NEGATIVE mg/dL
Leukocytes,Ua: NEGATIVE
Nitrite: NEGATIVE
Protein, ur: >=300 mg/dL — AB
Specific Gravity, Urine: 1.015 (ref 1.005–1.030)
pH: 5 (ref 5.0–8.0)

## 2023-05-11 LAB — RETICULOCYTES
Immature Retic Fract: 10 % (ref 2.3–15.9)
RBC.: 2.36 MIL/uL — ABNORMAL LOW (ref 3.87–5.11)
Retic Count, Absolute: 284.6 10*3/uL — ABNORMAL HIGH (ref 19.0–186.0)
Retic Ct Pct: 12.3 % — ABNORMAL HIGH (ref 0.4–3.1)

## 2023-05-11 LAB — TSH: TSH: 0.655 u[IU]/mL (ref 0.350–4.500)

## 2023-05-11 LAB — GLUCOSE, CAPILLARY: Glucose-Capillary: 364 mg/dL — ABNORMAL HIGH (ref 70–99)

## 2023-05-11 LAB — IRON AND TIBC
Iron: 37 ug/dL (ref 28–170)
Saturation Ratios: 10 % — ABNORMAL LOW (ref 10.4–31.8)
TIBC: 368 ug/dL (ref 250–450)
UIBC: 331 ug/dL

## 2023-05-11 LAB — HCG, SERUM, QUALITATIVE: Preg, Serum: NEGATIVE

## 2023-05-11 LAB — LACTATE DEHYDROGENASE: LDH: 273 U/L — ABNORMAL HIGH (ref 98–192)

## 2023-05-11 LAB — RESP PANEL BY RT-PCR (RSV, FLU A&B, COVID)  RVPGX2
Influenza A by PCR: NEGATIVE
Influenza A by PCR: POSITIVE — AB
Influenza B by PCR: NEGATIVE
Influenza B by PCR: NEGATIVE
Resp Syncytial Virus by PCR: NEGATIVE
Resp Syncytial Virus by PCR: NEGATIVE
SARS Coronavirus 2 by RT PCR: NEGATIVE
SARS Coronavirus 2 by RT PCR: NEGATIVE

## 2023-05-11 LAB — TECHNOLOGIST SMEAR REVIEW: Clinical Information: ELEVATED

## 2023-05-11 LAB — SEDIMENTATION RATE: Sed Rate: 50 mm/h — ABNORMAL HIGH (ref 0–22)

## 2023-05-11 LAB — BETA-HYDROXYBUTYRIC ACID: Beta-Hydroxybutyric Acid: 0.16 mmol/L (ref 0.05–0.27)

## 2023-05-11 LAB — BASIC METABOLIC PANEL WITH GFR
Anion gap: 7 (ref 5–15)
BUN: 19 mg/dL (ref 6–20)
CO2: 16 mmol/L — ABNORMAL LOW (ref 22–32)
Calcium: 7.8 mg/dL — ABNORMAL LOW (ref 8.9–10.3)
Chloride: 113 mmol/L — ABNORMAL HIGH (ref 98–111)
Creatinine, Ser: 1.2 mg/dL — ABNORMAL HIGH (ref 0.44–1.00)
GFR, Estimated: 58 mL/min — ABNORMAL LOW
Glucose, Bld: 281 mg/dL — ABNORMAL HIGH (ref 70–99)
Potassium: 4.8 mmol/L (ref 3.5–5.1)
Sodium: 136 mmol/L (ref 135–145)

## 2023-05-11 LAB — I-STAT CG4 LACTIC ACID, ED
Lactic Acid, Venous: 0.8 mmol/L (ref 0.5–1.9)
Lactic Acid, Venous: 1.4 mmol/L (ref 0.5–1.9)

## 2023-05-11 LAB — PROTIME-INR
INR: 1.1 (ref 0.8–1.2)
Prothrombin Time: 13.9 s (ref 11.4–15.2)

## 2023-05-11 LAB — TROPONIN I (HIGH SENSITIVITY)
Troponin I (High Sensitivity): 40 ng/L — ABNORMAL HIGH (ref <18)
Troponin I (High Sensitivity): 83 ng/L — ABNORMAL HIGH (ref <18)
Troponin I (High Sensitivity): 90 ng/L — ABNORMAL HIGH (ref <18)

## 2023-05-11 LAB — FERRITIN: Ferritin: 190 ng/mL (ref 11–307)

## 2023-05-11 LAB — CBG MONITORING, ED: Glucose-Capillary: 275 mg/dL — ABNORMAL HIGH (ref 70–99)

## 2023-05-11 LAB — HEMOGLOBIN A1C
Hgb A1c MFr Bld: 5 % (ref 4.8–5.6)
Mean Plasma Glucose: 96.8 mg/dL

## 2023-05-11 LAB — LIPASE, BLOOD: Lipase: 27 U/L (ref 11–51)

## 2023-05-11 LAB — T4, FREE: Free T4: 0.71 ng/dL (ref 0.61–1.12)

## 2023-05-11 LAB — CK: Total CK: 69 U/L (ref 38–234)

## 2023-05-11 LAB — ETHANOL: Alcohol, Ethyl (B): <10 mg/dL (ref <10)

## 2023-05-11 LAB — FOLATE: Folate: 7.5 ng/mL (ref >5.9)

## 2023-05-11 LAB — APTT: aPTT: 26 s (ref 24–36)

## 2023-05-11 LAB — PREPARE RBC (CROSSMATCH)

## 2023-05-11 LAB — PROCALCITONIN: Procalcitonin: 0.26 ng/mL

## 2023-05-11 MED ORDER — FUROSEMIDE 10 MG/ML IJ SOLN
40.0000 mg | Freq: Once | INTRAMUSCULAR | Status: AC
  Administered 2023-05-11: 40 mg via INTRAVENOUS
  Filled 2023-05-11: qty 4

## 2023-05-11 MED ORDER — SODIUM CHLORIDE 0.9 % IV SOLN
500.0000 mg | INTRAVENOUS | Status: DC
  Administered 2023-05-12 – 2023-05-16 (×5): 500 mg via INTRAVENOUS
  Filled 2023-05-11 (×6): qty 5

## 2023-05-11 MED ORDER — AMITRIPTYLINE HCL 25 MG PO TABS
25.0000 mg | ORAL_TABLET | Freq: Every day | ORAL | Status: DC
Start: 2023-05-11 — End: 2023-05-12
  Administered 2023-05-11: 25 mg via ORAL
  Filled 2023-05-11: qty 1

## 2023-05-11 MED ORDER — SODIUM CHLORIDE 0.9 % IV SOLN
500.0000 mg | Freq: Once | INTRAVENOUS | Status: AC
  Administered 2023-05-11: 500 mg via INTRAVENOUS
  Filled 2023-05-11: qty 5

## 2023-05-11 MED ORDER — ONDANSETRON HCL 4 MG/2ML IJ SOLN
4.0000 mg | Freq: Once | INTRAMUSCULAR | Status: DC
Start: 2023-05-11 — End: 2023-05-11

## 2023-05-11 MED ORDER — ACETAMINOPHEN 325 MG PO TABS
650.0000 mg | ORAL_TABLET | Freq: Once | ORAL | Status: AC
  Administered 2023-05-11: 650 mg via ORAL
  Filled 2023-05-11: qty 2

## 2023-05-11 MED ORDER — CYANOCOBALAMIN 1000 MCG/ML IJ SOLN: 1000.0000 ug | Freq: Once | INTRAMUSCULAR | Status: DC

## 2023-05-11 MED ORDER — MORPHINE SULFATE (PF) 2 MG/ML IV SOLN: 2.0000 mg | INTRAVENOUS | Status: DC | PRN

## 2023-05-11 MED ORDER — OSELTAMIVIR PHOSPHATE 75 MG PO CAPS
75.0000 mg | ORAL_CAPSULE | Freq: Two times a day (BID) | ORAL | Status: DC
  Administered 2023-05-12: 75 mg via ORAL
  Filled 2023-05-11 (×3): qty 1

## 2023-05-11 MED ORDER — INSULIN ASPART 100 UNIT/ML IJ SOLN
0.0000 [IU] | INTRAMUSCULAR | Status: DC
  Administered 2023-05-11: 5 [IU] via SUBCUTANEOUS

## 2023-05-11 MED ORDER — OSELTAMIVIR PHOSPHATE 75 MG PO CAPS: 75.0000 mg | ORAL_CAPSULE | Freq: Two times a day (BID) | ORAL | Status: DC

## 2023-05-11 MED ORDER — IPRATROPIUM-ALBUTEROL 0.5-2.5 (3) MG/3ML IN SOLN: 3.0000 mL | RESPIRATORY_TRACT | Status: DC | PRN

## 2023-05-11 MED ORDER — SODIUM CHLORIDE 0.9 % IV BOLUS
1000.0000 mL | Freq: Once | INTRAVENOUS | Status: AC
  Administered 2023-05-11: 1000 mL via INTRAVENOUS

## 2023-05-11 MED ORDER — VANCOMYCIN HCL 2000 MG/400ML IV SOLN
2000.0000 mg | Freq: Once | INTRAVENOUS | Status: DC
  Filled 2023-05-11: qty 400

## 2023-05-11 MED ORDER — FUROSEMIDE 20 MG PO TABS: 20.0000 mg | ORAL_TABLET | Freq: Two times a day (BID) | ORAL | Status: DC

## 2023-05-11 MED ORDER — SODIUM CHLORIDE 0.9 % IV SOLN: 2.0000 g | INTRAVENOUS | Status: DC

## 2023-05-11 MED ORDER — INSULIN ASPART 100 UNIT/ML IJ SOLN: 0.0000 [IU] | INTRAMUSCULAR | Status: DC | PRN

## 2023-05-11 MED ORDER — ACETAMINOPHEN 325 MG PO TABS: 650.0000 mg | ORAL_TABLET | Freq: Four times a day (QID) | ORAL | Status: DC | PRN

## 2023-05-11 MED ORDER — ACETAMINOPHEN 650 MG RE SUPP: 650.0000 mg | Freq: Four times a day (QID) | RECTAL | Status: DC | PRN

## 2023-05-11 MED ORDER — SODIUM CHLORIDE 0.9 % IV SOLN
2.0000 g | Freq: Three times a day (TID) | INTRAVENOUS | Status: DC
  Administered 2023-05-11 – 2023-05-12 (×2): 2 g via INTRAVENOUS
  Filled 2023-05-11 (×2): qty 12.5

## 2023-05-11 MED ORDER — ONDANSETRON 4 MG PO TBDP
4.0000 mg | ORAL_TABLET | Freq: Once | ORAL | Status: AC | PRN
  Administered 2023-05-11: 4 mg via ORAL
  Filled 2023-05-11: qty 1

## 2023-05-11 MED ORDER — METOPROLOL TARTRATE 25 MG PO TABS
25.0000 mg | ORAL_TABLET | Freq: Two times a day (BID) | ORAL | Status: DC
  Administered 2023-05-11 – 2023-05-12 (×3): 25 mg via ORAL
  Filled 2023-05-11 (×3): qty 1

## 2023-05-11 MED ORDER — BUDESONIDE 0.25 MG/2ML IN SUSP
0.2500 mg | Freq: Two times a day (BID) | RESPIRATORY_TRACT | Status: DC
  Administered 2023-05-11 – 2023-05-12 (×2): 0.25 mg via RESPIRATORY_TRACT
  Filled 2023-05-11 (×2): qty 2

## 2023-05-11 MED ORDER — VANCOMYCIN VARIABLE DOSE PER UNSTABLE RENAL FUNCTION (PHARMACIST DOSING): Status: DC

## 2023-05-11 MED ORDER — INSULIN ASPART 100 UNIT/ML IJ SOLN
0.0000 [IU] | Freq: Every day | INTRAMUSCULAR | Status: DC
Start: 2023-05-11 — End: 2023-05-12
  Administered 2023-05-11: 5 [IU] via SUBCUTANEOUS

## 2023-05-11 MED ORDER — PANTOPRAZOLE SODIUM 40 MG IV SOLR
40.0000 mg | Freq: Two times a day (BID) | INTRAVENOUS | Status: DC
  Administered 2023-05-11 – 2023-05-17 (×14): 40 mg via INTRAVENOUS
  Filled 2023-05-11 (×14): qty 10

## 2023-05-11 MED ORDER — DIPHENHYDRAMINE HCL 50 MG/ML IJ SOLN
50.0000 mg | Freq: Once | INTRAMUSCULAR | Status: AC
  Administered 2023-05-11: 50 mg via INTRAVENOUS
  Filled 2023-05-11: qty 1

## 2023-05-11 MED ORDER — ALBUTEROL SULFATE (2.5 MG/3ML) 0.083% IN NEBU
2.5000 mg | INHALATION_SOLUTION | RESPIRATORY_TRACT | Status: DC | PRN
  Administered 2023-05-11: 2.5 mg via RESPIRATORY_TRACT
  Filled 2023-05-11 (×2): qty 3

## 2023-05-11 MED ORDER — SODIUM CHLORIDE 0.9% IV SOLUTION: Freq: Once | INTRAVENOUS | Status: AC

## 2023-05-11 MED ORDER — BUDESONIDE 0.25 MG/2ML IN SUSP
0.2500 mg | Freq: Two times a day (BID) | RESPIRATORY_TRACT | Status: DC
Start: 2023-05-11 — End: 2023-05-11

## 2023-05-11 MED ORDER — HYDRALAZINE HCL 20 MG/ML IJ SOLN
10.0000 mg | Freq: Four times a day (QID) | INTRAMUSCULAR | Status: DC | PRN
  Administered 2023-05-16 – 2023-05-18 (×5): 10 mg via INTRAVENOUS
  Filled 2023-05-11 (×4): qty 1

## 2023-05-11 MED ORDER — SODIUM CHLORIDE 0.9% FLUSH
3.0000 mL | Freq: Two times a day (BID) | INTRAVENOUS | Status: DC
  Administered 2023-05-11 – 2023-05-23 (×26): 3 mL via INTRAVENOUS

## 2023-05-11 MED ORDER — SODIUM CHLORIDE 0.9 % IV SOLN
1.0000 g | Freq: Once | INTRAVENOUS | Status: AC
  Administered 2023-05-11: 1 g via INTRAVENOUS
  Filled 2023-05-11: qty 10

## 2023-05-11 MED ORDER — ALBUTEROL SULFATE (2.5 MG/3ML) 0.083% IN NEBU
2.5000 mg | INHALATION_SOLUTION | Freq: Once | RESPIRATORY_TRACT | Status: DC
Start: 2023-05-11 — End: 2023-05-11

## 2023-05-11 MED ORDER — FUROSEMIDE 20 MG PO TABS
20.0000 mg | ORAL_TABLET | Freq: Every day | ORAL | Status: DC
Start: 2023-05-11 — End: 2023-05-11
  Administered 2023-05-11: 20 mg via ORAL
  Filled 2023-05-11: qty 1

## 2023-05-11 MED ORDER — METHYLPREDNISOLONE SODIUM SUCC 40 MG IJ SOLR
40.0000 mg | Freq: Two times a day (BID) | INTRAMUSCULAR | Status: DC
  Administered 2023-05-11: 40 mg via INTRAVENOUS
  Filled 2023-05-11: qty 1

## 2023-05-11 MED ORDER — PROCHLORPERAZINE EDISYLATE 10 MG/2ML IJ SOLN
10.0000 mg | Freq: Once | INTRAMUSCULAR | Status: AC
  Administered 2023-05-11: 10 mg via INTRAVENOUS
  Filled 2023-05-11: qty 2

## 2023-05-11 MED ORDER — LORAZEPAM 2 MG/ML IJ SOLN
1.0000 mg | Freq: Once | INTRAMUSCULAR | Status: AC
Start: 2023-05-11 — End: 2023-05-11
  Administered 2023-05-11: 1 mg via INTRAVENOUS
  Filled 2023-05-11: qty 1

## 2023-05-11 MED ORDER — IOHEXOL 350 MG/ML SOLN
75.0000 mL | Freq: Once | INTRAVENOUS | Status: AC | PRN
  Administered 2023-05-11: 75 mL via INTRAVENOUS

## 2023-05-11 NOTE — ED Notes (Signed)
 Patient's O2 noted to be lingering around 90 % RA. Patient placed onto 2 lpm

## 2023-05-11 NOTE — Progress Notes (Signed)
 RT note. RT attempt x2 abg with doppler unsuccessful. MD aware, MD ok with vbg. MD/RN aware. RT will continue to monitor.

## 2023-05-11 NOTE — Progress Notes (Signed)
 PHARMACY ANTIBIOTIC CONSULT NOTE   Melinda Stone a 43 y.o. female admitted with fevers, chills, malaise, diffuse bodyaches, n/v and headache. Patient subsequently developed respiratory distress with high O2 requirement. Patient is positive for influenza A.  Pharmacy has been consulted for vancomycin/CFP dosing.  PCT 0.26, MRSA PCR sent   2/22: Scr 1.20 (BL ~0.7), WBC 8.4  Vital Signs: Tm 101.3, HR elevated, BP elevated, HFNC 11L   Estimated Creatinine Clearance: 76.7 mL/min (A) (by C-G formula based on SCr of 1.2 mg/dL (H)).  Plan: START Cefepime 2g IV Q8H Start tamiflu 75 mg BID  Start Vancomycin 2,000 mg once, subsequent dosing per unstable renal function- Vancomycin random in AM  STOP ceftriaxone  Continue azithromycin  Monitor renal function, clinical status, de-escalation, C/S, levels as indicated , F/U MRSA PCR, repeat PCT   Allergies:  Allergies  Allergen Reactions   Trulicity [Dulaglutide] Diarrhea    Filed Weights   05/11/23 0545  Weight: 97.5 kg (215 lb)       Latest Ref Rng & Units 05/11/2023    6:04 AM 04/22/2023   11:19 AM 04/21/2023   11:47 AM  CBC  WBC 4.0 - 10.5 K/uL 8.4  6.8  7.2   Hemoglobin 12.0 - 15.0 g/dL 6.9  8.0  8.6   Hematocrit 36.0 - 46.0 % 20.3  23.2  26.7   Platelets 150 - 400 K/uL 138  138  133     Antibiotics Given (last 72 hours)     Date/Time Action Medication Dose Rate   05/11/23 1500 New Bag/Given   cefTRIAXone (ROCEPHIN) 1 g in sodium chloride 0.9 % 100 mL IVPB 1 g 200 mL/hr   05/11/23 1548 New Bag/Given   azithromycin (ZITHROMAX) 500 mg in sodium chloride 0.9 % 250 mL IVPB 500 mg 250 mL/hr       Antimicrobials this admission: CRO x1 2/22 Azithro 2/22 >> Vancomycin 2/22>> Cefepime 2/22>>   Microbiology results: 2/22 MRSA PCR: sent  2/22 4-plex PCR: Flu A+  2/22 RVP: not detected   Thank you for allowing pharmacy to be a part of this patient's care.  Jani Gravel, PharmD Clinical Pharmacist  05/11/2023 9:09 PM

## 2023-05-11 NOTE — ED Triage Notes (Signed)
 C/o abd pain vomiting x 4 c/o headache and generalized bodyaches  onset earlier yest states her son has the flu

## 2023-05-11 NOTE — H&P (Signed)
 History and Physical    Patient: Melinda Stone EXB:[id number] DOB: 1980-03-20 DOA: 05/11/2023 DOS: the patient was seen and examined on 05/11/2023 PCP: Anselm Jungling, NP  Patient coming from: Home Chief complaint: Chief Complaint  Patient presents with   Abdominal Pain   Emesis   Headache   HPI:  Melinda Stone is a 43 y.o. 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  And ill contact with her son having the flu.  Patient's history of anemia has been attributed to her menorrhagia patient is actually seeing hematology for her anemia and takes oral supplementation of B12.  Patient also reports generalized myalgias does not open her eyes and speak to me. Pt most recent 2 d echo was in June 2023 showing: 1. Left ventricular ejection fraction, by estimation, is 60 to 65% . The left ventricle has normal function. The left ventricle has no regional wall motion abnormalities. There is mild left ventricular hypertrophy. Left ventricular diastolic parameters are consistent with Grade I diastolic dysfunction ( impaired relaxation) . The average left ventricular global longitudinal strain is - 14. 3 % . The global longitudinal strain is normal. 2. Right ventricular systolic function is normal. The right ventricular size is normal. There is normal pulmonary artery systolic pressure. The estimated right ventricular systolic pressure is 24. 5 mmHg. 3. The mitral valve is normal in structure. No evidence of mitral valve  regurgitation. No evidence of mitral stenosis. 4. Tricuspid valve regurgitation is mild to moderate. 5. The aortic valve is tricuspid. Aortic valve regurgitation is not visualized. No aortic stenosis is present. 6. The inferior vena cava is normal in size with greater than 50% respiratory variability, suggesting right atrial pressure of 3 mmHg.  >>ED Course: In emergency room patient is alert awake oriented meeting sepsis criteria with fever and heart rate and source of infection as far as blood pressure patient's been hypoxic requiring 2 L nasal cannula and fever with source. Patient is ill-appearing and in no distress HPI is limited due to acuity. Vitals:   05/11/23 1458 05/11/23 1515 05/11/23 1545 05/11/23 1641  BP: (!) 171/85 (!) 158/77 (!) 149/67   Pulse:  (!) 123 (!) 118 (!) 105  Temp:      Resp:  (!) 34 19 (!) 25  Height:      Weight:      SpO2: 93% 97% 93% 99%  TempSrc:      BMI (Calculated):      EKG ordered and pending ED evaluation  so far shows: Chest x-ray shows findings concerning for right upper lobe pneumonia. CT angio chest PE protocol requested shows PNA and pulm edema.  Normal CMP with potassium of 5.4 bicarb 18 anion gap as 7 creatinine 1.04 EGFR more than normal LFTs. Negative viral panel for flu RSV and COVID. Urinalysis shows large hemoglobin and PRBCs. In the emergency room  pt has received the following treatment thus far: Medications  cefTRIAXone (ROCEPHIN) 1 g in sodium chloride 0.9 % 100 mL IVPB (has no administration in time range)  azithromycin (ZITHROMAX) 500 mg in sodium  chloride 0.9 % 250 mL IVPB (has no administration in time range)  0.9 %  sodium chloride infusion (Manually program via Guardrails IV Fluids) (has no administration in time range)  ondansetron (ZOFRAN-ODT) disintegrating tablet 4 mg (4 mg Oral Given 05/11/23 0606)  prochlorperazine (COMPAZINE) injection 10 mg (10 mg Intravenous Given 05/11/23 0843)  diphenhydrAMINE (BENADRYL) injection 50  mg (50 mg Intravenous Given 05/11/23 0843)  sodium chloride 0.9 % bolus 1,000 mL (0 mLs Intravenous Stopped 05/11/23 0950)  acetaminophen (TYLENOL) tablet 650 mg (650 mg Oral Given 05/11/23 0845)  iohexol (OMNIPAQUE) 350 MG/ML injection 75 mL (75 mLs Intravenous Contrast Given 05/11/23 1050)   Review of Systems  Unable to perform ROS: Other (pt is ill apperaing and LTD HPI)  Constitutional:  Positive for malaise/fatigue.  Gastrointestinal:  Positive for abdominal pain, nausea and vomiting.  Neurological:  Positive for headaches.  All other systems reviewed and are negative.  Past Medical History:  Diagnosis Date   Anemia    Arthritis 08/17/2020   Chronic back pain    Diabetes mellitus without complication (HCC)    type 2   GERD (gastroesophageal reflux disease)    Migraine    Ovarian cyst    Pneumonia    Polyneuropathy    Past Surgical History:  Procedure Laterality Date   CESAREAN SECTION     CHOLECYSTECTOMY  2004   LUMBAR LAMINECTOMY N/A 05/30/2020   Procedure: Lumbar five Laminectomy,  Bilateral Microdiscectomy, Left Lumbar five -Sacral one Microdiscectomy;  Surgeon: [name], MD;  Location: [facility] OR;  Service: Orthopedics;  Laterality: N/A;   LUMBAR LAMINECTOMY Left 11/14/2020   Procedure: LEFT LUMBAR FOUR-FIVE MICRODISCECTOMY;  Surgeon: [name], MD;  Location: [facility] OR;  Service: Orthopedics;  Laterality: Left;   TUBAL LIGATION  10/19/2010    reports that she has been smoking cigars. She has never used smokeless tobacco. She reports that she does not currently use alcohol. She reports that she does not currently use drugs.  Allergies  Allergen Reactions   Trulicity [Dulaglutide] Diarrhea    Family History  Problem Relation Age of Onset   Diabetes Mother    Hypertension Mother    Stroke Mother     Prior to Admission medications   Medication Sig Start Date End Date Taking? Authorizing Provider  acetaminophen (TYLENOL) 500 MG tablet Take 500 mg by mouth every 6  (six) hours as needed for mild pain or moderate pain.    [provider]  amitriptyline (ELAVIL) 25 MG tablet Take 1 tablet (25 mg total) by mouth at bedtime. 01/31/23   Glean Salvo, NP  celecoxib (CELEBREX) 100 MG capsule Take 100 mg by mouth 2 (two) times daily. 01/21/23   [provider]  cholecalciferol (VITAMIN D3) 25 MCG (1000 UNIT) tablet Take 1,000 Units by mouth daily.    [provider]  Cyanocobalamin (VITAMIN B-12) 2500 MCG SUBL Place 1 tablet (2,500 mcg total) under the tongue daily. 04/23/23   Rickard Patience, MD  furosemide (LASIX) 20 MG tablet Take 1 tablet (20 mg total) by mouth daily. 04/21/23   Prosperi, Christian H, PA-C  gabapentin (NEURONTIN) 800 MG tablet Take 800 mg by mouth 3 (three) times daily.    [provider]  insulin aspart (NOVOLOG) 100 UNIT/ML FlexPen Before each meal 3 times a day, 140-199 - 2 units, 200-250 - 4 units, 251-299 - 8 units,  300-349 - 10 units,  350 or above 12 units. 12/27/22   Leroy Sea, MD  insulin glargine (LANTUS SOLOSTAR) 100 UNIT/ML Solostar Pen Inject 30 Units into the skin at bedtime. 12/27/22   Leroy Sea, MD  methocarbamol (ROBAXIN) 500 MG tablet Take 1 tablet (500 mg total) by mouth 2 (two) times daily. Patient taking differently: Take 500 mg by mouth at bedtime. 01/12/23   Jeannie Fend, PA-C  metoprolol tartrate (LOPRESSOR) 25 MG tablet Take 1 tablet (25 mg total) by mouth 2 (two) times daily. 03/27/23 04/26/23  Dezii, Alexandra, DO  ondansetron (ZOFRAN-ODT) 4 MG disintegrating tablet Take 1 tablet (4 mg total) by mouth every 6 (six) hours as needed for nausea or vomiting. 04/21/23   Prosperi, Christian H, PA-C  oxyCODONE-acetaminophen (PERCOCET/ROXICET) 5-325 MG tablet Take 1 tablet by mouth every 6 (six) hours as needed for moderate pain or severe pain. 03/13/21   [provider]  tiZANidine (ZANAFLEX) 4 MG tablet Take 4 mg by mouth at bedtime. 02/17/23   [provider]  Ubrogepant  (UBRELVY) 100 MG TABS Take 1 tablet (100 mg total) by mouth as needed. Take 1 tablet at onset of headache, may repeat in 2 hours if needed. Max is 200 mg in 24 hours. 01/31/23   Glean Salvo, NP  vitamin C (ASCORBIC ACID) 250 MG tablet Take 2 tablets (500 mg total) by mouth daily. Patient taking differently: Take 250 mg by mouth daily. 04/10/22   Rickard Patience, MD     Vitals:   05/11/23 1458 05/11/23 1515 05/11/23 1545 05/11/23 1641  BP: (!) 171/85 (!) 158/77 (!) 149/67   Pulse:  (!) 123 (!) 118 (!) 105  Resp:  (!) 34 19 (!) 25  Temp:      TempSrc:      SpO2: 93% 97% 93% 99%  Weight:      Height:       Physical Exam Vitals and nursing note reviewed.  Constitutional:      General: She is not in acute distress. HENT:     Head: Normocephalic and atraumatic.     Right Ear: Hearing normal.     Left Ear: Hearing normal.  Eyes:     General: Lids are normal.     Extraocular Movements: Extraocular movements intact.  Cardiovascular:     Rate and Rhythm: Normal rate and regular rhythm.     Heart sounds: Normal heart sounds.  Pulmonary:     Effort: Pulmonary effort is normal.     Breath sounds: Normal breath sounds.  Abdominal:     General: Bowel sounds are normal. There is no distension.     Palpations: Abdomen is soft. There is no mass.     Tenderness: There is generalized abdominal tenderness.  Musculoskeletal:     Right lower leg: No edema.     Left lower leg: No edema.  Skin:    General: Skin is warm.  Neurological:     General: No focal deficit present.     Mental Status: She is alert and oriented to person, place, and time.     Cranial Nerves: Cranial nerves 2-12 are intact. No cranial nerve deficit.  Psychiatric:        Attention and Perception: She is inattentive.        Mood and Affect: Affect is blunt.        Speech: Speech normal.        Behavior: Behavior is cooperative.    Labs on Admission: I have personally reviewed following labs and imaging studies Results for  orders placed or performed  during the hospital encounter of 05/11/23 (from the past 24 hours)  Resp panel by RT-PCR (RSV, Flu A&B, Covid) Anterior Nasal Swab     Status: None   Collection Time: 05/11/23  5:44 AM   Specimen: Anterior Nasal Swab  Result Value Ref Range   SARS Coronavirus 2 by RT PCR NEGATIVE NEGATIVE   Influenza A by PCR NEGATIVE NEGATIVE   Influenza B by PCR NEGATIVE NEGATIVE   Resp Syncytial Virus by PCR NEGATIVE NEGATIVE  Beta-hydroxybutyric acid     Status: None   Collection Time: 05/11/23  6:03 AM  Result Value Ref Range   Beta-Hydroxybutyric Acid 0.16 0.05 - 0.27 mmol/L  T4, free     Status: None   Collection Time: 05/11/23  6:03 AM  Result Value Ref Range   Free T4 0.71 0.61 - 1.12 ng/dL  TSH     Status: None   Collection Time: 05/11/23  6:03 AM  Result Value Ref Range   TSH 0.655 0.350 - 4.500 uIU/mL  Folate     Status: None   Collection Time: 05/11/23  6:03 AM  Result Value Ref Range   Folate 7.5 >5.9 ng/mL  Reticulocytes     Status: Abnormal   Collection Time: 05/11/23  6:03 AM  Result Value Ref Range   Retic Ct Pct 12.3 (H) 0.4 - 3.1 %   RBC. 2.36 (L) 3.87 - 5.11 MIL/uL   Retic Count, Absolute 284.6 (H) 19.0 - 186.0 K/uL   Immature Retic Fract 10.0 2.3 - 15.9 %  Ethanol     Status: None   Collection Time: 05/11/23  6:03 AM  Result Value Ref Range   Alcohol, Ethyl (B) <10 <10 mg/dL  Hemoglobin Z6X     Status: None   Collection Time: 05/11/23  6:03 AM  Result Value Ref Range   Hgb A1c MFr Bld 5.0 4.8 - 5.6 %   Mean Plasma Glucose 96.8 mg/dL  Sedimentation rate     Status: Abnormal   Collection Time: 05/11/23  6:03 AM  Result Value Ref Range   Sed Rate 50 (H) 0 - 22 mm/hr  Lipase, blood     Status: None   Collection Time: 05/11/23  6:04 AM  Result Value Ref Range   Lipase 27 11 - 51 U/L  Comprehensive metabolic panel     Status: Abnormal   Collection Time: 05/11/23  6:04 AM  Result Value Ref Range   Sodium 137 135 - 145 mmol/L   Potassium  5.4 (H) 3.5 - 5.1 mmol/L   Chloride 112 (H) 98 - 111 mmol/L   CO2 18 (L) 22 - 32 mmol/L   Glucose, Bld 280 (H) 70 - 99 mg/dL   BUN 17 6 - 20 mg/dL   Creatinine, Ser 0.96 (H) 0.44 - 1.00 mg/dL   Calcium 8.3 (L) 8.9 - 10.3 mg/dL   Total Protein 5.9 (L) 6.5 - 8.1 g/dL   Albumin 3.0 (L) 3.5 - 5.0 g/dL   AST 25 15 - 41 U/L   ALT 25 0 - 44 U/L   Alkaline Phosphatase 100 38 - 126 U/L   Total Bilirubin 3.2 (H) 0.0 - 1.2 mg/dL   GFR, Estimated >04 >54 mL/min   Anion gap 7 5 - 15  CBC     Status: Abnormal   Collection Time: 05/11/23  6:04 AM  Result Value Ref Range   WBC 8.4 4.0 - 10.5 K/uL   RBC 2.44 (L) 3.87 - 5.11 MIL/uL   Hemoglobin  6.9 (LL) 12.0 - 15.0 g/dL   HCT 16.1 (L) 09.6 - 04.5 %   MCV 83.2 80.0 - 100.0 fL   MCH 28.3 26.0 - 34.0 pg   MCHC 34.0 30.0 - 36.0 g/dL   RDW 40.9 (H) 81.1 - 91.4 %   Platelets 138 (L) 150 - 400 K/uL   nRBC 0.0 0.0 - 0.2 %  hCG, serum, qualitative     Status: None   Collection Time: 05/11/23  6:04 AM  Result Value Ref Range   Preg, Serum NEGATIVE NEGATIVE  Protime-INR     Status: None   Collection Time: 05/11/23  6:04 AM  Result Value Ref Range   Prothrombin Time 13.9 11.4 - 15.2 seconds   INR 1.1 0.8 - 1.2  Urinalysis, Routine w reflex microscopic -Urine, Clean Catch     Status: Abnormal   Collection Time: 05/11/23  6:12 AM  Result Value Ref Range   Color, Urine AMBER (A) YELLOW   APPearance HAZY (A) CLEAR   Specific Gravity, Urine 1.015 1.005 - 1.030   pH 5.0 5.0 - 8.0   Glucose, UA 50 (A) NEGATIVE mg/dL   Hgb urine dipstick LARGE (A) NEGATIVE   Bilirubin Urine NEGATIVE NEGATIVE   Ketones, ur NEGATIVE NEGATIVE mg/dL   Protein, ur >=782 (A) NEGATIVE mg/dL   Nitrite NEGATIVE NEGATIVE   Leukocytes,Ua NEGATIVE NEGATIVE   RBC / HPF 21-50 0 - 5 RBC/hpf   WBC, UA 0-5 0 - 5 WBC/hpf   Bacteria, UA RARE (A) NONE SEEN   Squamous Epithelial / HPF 6-10 0 - 5 /HPF   Mucus PRESENT   I-Stat CG4 Lactic Acid     Status: None   Collection Time: 05/11/23  10:41 AM  Result Value Ref Range   Lactic Acid, Venous 0.8 0.5 - 1.9 mmol/L  Prepare RBC (crossmatch)     Status: None   Collection Time: 05/11/23 10:52 AM  Result Value Ref Range   Order Confirmation      ORDER PROCESSED BY BLOOD BANK Performed at [facility], [address], [place], [place] [address]   Type and screen [facility]     Status: None   Collection Time: 05/11/23  2:00 PM  Result Value Ref Range   ABO/RH(D) A POS    Antibody Screen NEG    Sample Expiration      05/14/2023,2359 Performed at [facility], [address], [place], [place] [address]   CK     Status: None   Collection Time: 05/11/23  2:06 PM  Result Value Ref Range   Total CK 69 38 - 234 U/L  Procalcitonin     Status: None   Collection Time: 05/11/23  2:06 PM  Result Value Ref Range   Procalcitonin 0.26 ng/mL  Comprehensive metabolic panel     Status: Abnormal   Collection Time: 05/11/23  2:06 PM  Result Value Ref Range   Sodium 137 135 - 145 mmol/L   Potassium 5.2 (H) 3.5 - 5.1 mmol/L   Chloride 114 (H) 98 - 111 mmol/L   CO2 17 (L) 22 - 32 mmol/L   Glucose, Bld 284 (H) 70 - 99 mg/dL   BUN 18 6 - 20 mg/dL   Creatinine, Ser 7.84 (H) 0.44 - 1.00 mg/dL   Calcium 8.2 (L) 8.9 - 10.3 mg/dL   Total Protein 6.0 (L) 6.5 - 8.1 g/dL   Albumin 2.9 (L) 3.5 - 5.0 g/dL   AST 27 15 -  41 U/L   ALT 28 0 - 44 U/L   Alkaline Phosphatase 125 38 - 126 U/L   Total Bilirubin 3.2 (H) 0.0 - 1.2 mg/dL   GFR, Estimated >65 >78 mL/min   Anion gap 6 5 - 15  I-Stat CG4 Lactic Acid     Status: None   Collection Time: 05/11/23  2:14 PM  Result Value Ref Range   Lactic Acid, Venous 1.4 0.5 - 1.9 mmol/L  CBG monitoring, ED     Status: Abnormal   Collection Time: 05/11/23  2:50 PM  Result Value Ref Range   Glucose-Capillary 275 (H) 70 - 99 mg/dL   Recent Results (from the past 720 hours)  Resp panel by RT-PCR (RSV, Flu A&B, Covid) Anterior Nasal Swab     Status: None   Collection Time:  05/11/23  5:44 AM   Specimen: Anterior Nasal Swab  Result Value Ref Range Status   SARS Coronavirus 2 by RT PCR NEGATIVE NEGATIVE Final   Influenza A by PCR NEGATIVE NEGATIVE Final   Influenza B by PCR NEGATIVE NEGATIVE Final    Comment: (NOTE) The Xpert Xpress SARS-CoV-2/FLU/RSV plus assay is intended as an aid in the diagnosis of influenza from Nasopharyngeal swab specimens and should not be used as a sole basis for treatment. Nasal washings and aspirates are unacceptable for Xpert Xpress SARS-CoV-2/FLU/RSV testing.  Fact Sheet for Patients: BloggerCourse.com  Fact Sheet for Healthcare Providers: SeriousBroker.it  This test is not yet approved or cleared by the [place] FDA and has been authorized for detection and/or diagnosis of SARS-CoV-2 by FDA under an Emergency Use Authorization (EUA). This EUA will remain in effect (meaning this test can be used) for the duration of the COVID-19 declaration under Section 564(b)(1) of the Act, 21 U.S.C. section 360bbb-3(b)(1), unless the authorization is terminated or revoked.     Resp Syncytial Virus by PCR NEGATIVE NEGATIVE Final    Comment: (NOTE) Fact Sheet for Patients: BloggerCourse.com  Fact Sheet for Healthcare Providers: SeriousBroker.it  This test is not yet approved or cleared by the [place] FDA and has been authorized for detection and/or diagnosis of SARS-CoV-2 by FDA under an Emergency Use Authorization (EUA). This EUA will remain in effect (meaning this test can be used) for the duration of the COVID-19 declaration under Section 564(b)(1) of the Act, 21 U.S.C. section 360bbb-3(b)(1), unless the authorization is terminated or revoked.  Performed at [facility], [address] [place], [place] [address]    CBC:    Latest Ref Rng & Units 05/11/2023    6:04 AM 04/22/2023   11:19 AM 04/21/2023   11:47  AM  CBC  WBC 4.0 - 10.5 K/uL 8.4  6.8  7.2   Hemoglobin 12.0 - 15.0 g/dL 6.9  8.0  8.6   Hematocrit 36.0 - 46.0 % 20.3  23.2  26.7   Platelets 150 - 400 K/uL 138  138  133    Basic Metabolic Panel: Recent Labs  Lab 05/11/23 0604 05/11/23 1406  NA 137 137  K 5.4* 5.2*  CL 112* 114*  CO2 18* 17*  GLUCOSE 280* 284*  BUN 17 18  CREATININE 1.04* 1.07*  CALCIUM 8.3* 8.2*   Creatinine: Lab Results  Component Value Date   CREATININE 1.07 (H) 05/11/2023   CREATININE 1.04 (H) 05/11/2023   CREATININE 0.93 04/21/2023   Liver Function Tests:    Latest Ref Rng & Units 05/11/2023    2:06 PM 05/11/2023    6:04  AM 04/21/2023   11:47 AM  Hepatic Function  Total Protein 6.5 - 8.1 g/dL 6.0  5.9  5.7   Albumin 3.5 - 5.0 g/dL 2.9  3.0  2.8   AST 15 - 41 U/L 27  25  18    ALT 0 - 44 U/L 28  25  10    Alk Phosphatase 38 - 126 U/L 125  100  102   Total Bilirubin 0.0 - 1.2 mg/dL 3.2  3.2  1.1    Coagulation Profile: Recent Labs  Lab 05/11/23 0604  INR 1.1   Cardiac Enzymes: Recent Labs  Lab 05/11/23 1406  CKTOTAL 69   BNP (last 3 results) No results for input(s): "PROBNP" in the last 8760 hours. HbA1C: Recent Labs    05/11/23 0603  HGBA1C 5.0   Lipid Profile: No results for input(s): "CHOL", "HDL", "LDLCALC", "TRIG", "CHOLHDL", "LDLDIRECT" in the last 72 hours.  Radiological Exams on Admission: MR BRAIN WO CONTRAST Result Date: 05/11/2023 CLINICAL DATA:  Headache, new onset (Age >= 51y) EXAM: MRI HEAD WITHOUT CONTRAST TECHNIQUE: Multiplanar, multiecho pulse sequences of the brain and surrounding structures were obtained without intravenous contrast. COMPARISON:  None Available. FINDINGS: Brain: Negative for an acute infarct. No hemorrhage. No hydrocephalus. No extra-axial fluid collection. No mass effect. No mass lesion. There are scattered periventricular and subcortical T2/FLAIR hyperintense lesions, favored to represent sequela of chronic microvascular ischemic change. Vascular:  Normal flow voids. Skull and upper cervical spine: Normal marrow signal. Sinuses/Orbits: No middle ear or mastoid effusion. Paranasal sinuses are notable for mild pansinus mucosal thickening. Orbits are unremarkable. Other: None. IMPRESSION: No acute intracranial process. No specific etiology for headaches identified. Electronically Signed   By: Lorenza Cambridge M.D.   On: 05/11/2023 14:41   US PELVIC COMPLETE WITH TRANSVAGINAL Result Date: 05/11/2023 CLINICAL DATA:  [id number] Abdominal pain [id number] Endometrial mass [id number]. Last menstrual period 04/25/2023 EXAM: TRANSABDOMINAL AND TRANSVAGINAL ULTRASOUND OF PELVIS TECHNIQUE: Both transabdominal and transvaginal ultrasound examinations of the pelvis were performed. Transabdominal technique was performed for global imaging of the pelvis including uterus, ovaries, adnexal regions, and pelvic cul-de-sac. It was necessary to proceed with endovaginal exam following the transabdominal exam to visualize the endometrium and bilateral ovaries. COMPARISON:  CT abdomen pelvis 02/09/2023 FINDINGS: Uterus Measurements: 9.3 x 5.9 x 7.5 cm = volume: 215.2 mL. 3.8 x 3.1 x 3 cm heterogeneous posterior right intramural mass likely uterine fibroid. Endometrium Thickness: 9 mm.  No focal abnormality visualized. Right ovary Measurements: 3.2 x 2 x 2.1 cm = volume: 7.1 mL. The right ovary is unremarkable. There is a tubular like anechoic structure within the right adnexa. Left ovary Measurements: 4.1 x 1.8 x 3.7 cm = volume: 14.1 mL. Normal appearance/no adnexal mass. Other findings Small volume simple free fluid within the pelvis. IMPRESSION: 1. Suggestion of right hydrosalpinx. 2. Trace simple free fluid within the pelvis. 3. Intramural uterine fibroid. Electronically Signed   By: Tish Frederickson M.D.   On: 05/11/2023 13:51   CT Angio Chest PE W and/or Wo Contrast Result Date: 05/11/2023 CLINICAL DATA:  Four day history of fever, cough, generalized body aches EXAM: CT ANGIOGRAPHY  CHEST WITH CONTRAST TECHNIQUE: Multidetector CT imaging of the chest was performed using the standard protocol during bolus administration of intravenous contrast. Multiplanar CT image reconstructions and MIPs were obtained to evaluate the vascular anatomy. RADIATION DOSE REDUCTION: This exam was performed according to the departmental dose-optimization program which includes automated exposure control, adjustment of the mA and/or kV according  to patient size and/or use of iterative reconstruction technique. CONTRAST:  75mL OMNIPAQUE IOHEXOL 350 MG/ML SOLN COMPARISON:  Same day chest radiograph, CTA chest dated 03/25/2023 FINDINGS: Cardiovascular: The study is adequate for the evaluation of pulmonary embolism to the level of the proximal subsegmental arteries due to contrast bolus timing and motion artifact. There are no filling defects in the central, lobar, segmental or proximal subsegmental pulmonary artery branches to suggest acute pulmonary embolism. Mildly dilated main pulmonary artery measures 3.4 cm in diameter, unchanged. Aberrant right subclavian artery. Normal heart size. No significant pericardial fluid/thickening. Mediastinum/Nodes: Imaged thyroid gland without nodules meeting criteria for imaging follow-up by size. Normal esophagus. Increased multi station mediastinal and hilar lymphadenopathy, for example 13 mm right low pretracheal (5:47), previously 9 mm, 20 mm right hilar (5:54), previously 14 mm, and 21 mm subcarinal (5:62), previously 14 mm. Lungs/Pleura: Trace layering secretions within the trachea and extending into left main bronchus. Mild diffuse bronchial wall thickening. Asymmetric right-greater-than-left interlobular septal thickening. New right lung predominant consolidative and nodular opacities within the right upper, middle, and lower lobes. Minimal ground-glass nodules within the left lower lobe. Previously noted peribronchovascular ground-glass opacities are no longer seen. No  pneumothorax. Trace right pleural effusion. Upper abdomen: Mildly dilated hepatic veins measure up to 19 mm. Enlarged spleen measures 15.2 cm in AP dimension, previously 13.5 cm. Musculoskeletal: No acute or abnormal lytic or blastic osseous lesions. Review of the MIP images confirms the above findings. IMPRESSION: 1. No evidence of pulmonary embolism to the level of the proximal subsegmental pulmonary arteries. 2. New right lung-predominant consolidative and nodular opacities, likely multifocal pneumonia. 3. Asymmetric right-greater-than-left interlobular septal thickening and trace right pleural effusion, likely pulmonary edema. 4. Increased multi station mediastinal and hilar lymphadenopathy, likely reactive. 5. Mildly dilated hepatic veins and spleen, which can be seen in the setting of right heart dysfunction. 6. Mildly dilated main pulmonary artery, which can be seen in the setting of pulmonary hypertension. Electronically Signed   By: Agustin Cree M.D.   On: 05/11/2023 11:15   DG Chest Portable 1 View Result Date: 05/11/2023 CLINICAL DATA:  Cough, fever. EXAM: PORTABLE CHEST 1 VIEW COMPARISON:  April 21, 2023. FINDINGS: Stable cardiomegaly. Mild central pulmonary vascular congestion is noted. Mild right upper lobe airspace opacity is noted concerning for pneumonia. Bony thorax is unremarkable. IMPRESSION: Stable cardiomegaly with mild central pulmonary vascular congestion. Mild right upper lobe opacity is noted concerning for pneumonia. Followup PA and lateral chest X-ray is recommended in 3-4 weeks following trial of antibiotic therapy to ensure resolution and exclude underlying malignancy. Electronically Signed   By: Lupita Raider M.D.   On: 05/11/2023 09:20    Data Reviewed: Relevant notes from primary care and specialist visits, past discharge summaries as available in EHR, including Care Everywhere. Prior diagnostic testing as pertinent to current admission diagnoses, Updated medications and  problem lists for reconciliation ED course, including vitals, labs, imaging, treatment and response to treatment,Triage notes, nursing and pharmacy notes and ED provider's notes Notable results as noted in HPI.Discussed case with EDMD/ ED APP/ or Specialty MD on call and as needed.  Assessment and Plan: >> Sepsis /PNA: Continue with CAP coverage rocephin and azithromycin.  Procalcitonin is pending.suspect infectious but pt has pulmonary edema and also underlying sarcoid ? Pt will need pulmonary evaluation as deemed appropriate.  Prn albuterol and scheduled pulmicort/ IS.  Lasix 20 mg po bid.   >>Headache: Worse than before and pt has h/o migraine however also has  risk factors for stroke. We will get MRI brain as she has had ct contrast.    >>Hyperkalemia: Potassium of 5.4 on initial metabolic panel done at 6 AM will repeat and treat if still elevated.   >>N/V abd pain: Differentials include GERD or peptic ulcer disease related patient is status post cholecystectomy patient does have hepatic steatosis with hepatomegaly. IV PPI and PRN morphine.  Differentials also include that her abdominal pain may be related to her uterus, patient had STD testing in January and per OB/GYN patient is still in the process of having her endometrial lesion addressed.   We will consult GYN while pt is here in hospital and obtain pelvis TV US.   >>AKI: Lab Results  Component Value Date   CREATININE 1.07 (H) 05/11/2023   CREATININE 1.04 (H) 05/11/2023   CREATININE 0.93 04/21/2023  Mild will cont with IVF hydration after pt receives her one unit of blood then reassess if she can tolerate second or needs second. We will give her lasix 20 mg iv after first unit. As she has pulmonary edema on chest imaging.    >>DMII: We will get A1c/ glycemic protocol q 4 hours regardless of diet. Until plan is more clear.    >>Essential HTN: Continue metoprolol.  Prn hydralazine.    >>Metabolic acidosis: Suspect  from GI losses.  Lactic is negative. We will follow.   >>Anemia:    Latest Ref Rng & Units 05/11/2023    6:04 AM 04/22/2023   11:19 AM 04/21/2023   11:47 AM  CBC  WBC 4.0 - 10.5 K/uL 8.4  6.8  7.2   Hemoglobin 12.0 - 15.0 g/dL 6.9  8.0  8.6   Hematocrit 36.0 - 46.0 % 20.3  23.2  26.7   Platelets 150 - 400 K/uL 138  138  133   Acutely worse. Will type and screen and transfuse 1 unit over 4 hours each.  TV pelvic usg.    >>Endometrial mass: Pt is being followed by Gyn last note in jan 2025.  I am concerned about malignancy and we will get TV USG.    >>Thrombocytopenia: ? If related to meds or sepsis or b12 def . Do not suspect hemolysis , have d/w on call hematology as well and will continue with b12 replacement.   >>b12 def: 1 mg b12 IM.   DVT prophylaxis:  SCD's Consults:  None  Advance Care Planning:    Code Status: Full Code   Family Communication:  None  Disposition Plan:  Home.  Severity of Illness: The appropriate patient status for this patient is INPATIENT. Inpatient status is judged to be reasonable and necessary in order to provide the required intensity of service to ensure the patient's safety. The patient's presenting symptoms, physical exam findings, and initial radiographic and laboratory data in the context of their chronic comorbidities is felt to place them at high risk for further clinical deterioration. Furthermore, it is not anticipated that the patient will be medically stable for discharge from the hospital within 2 midnights of admission.   * I certify that at the point of admission it is my clinical judgment that the patient will require inpatient hospital care spanning beyond 2 midnights from the point of admission due to high intensity of service, high risk for further deterioration and high frequency of surveillance required.*  Author: Gertha Calkin, MD 05/11/2023 5:27 PM  For on call review [email or web address].   Unresulted Labs (From admission,  onward)  Start     Ordered   05/12/23 0500  Comprehensive metabolic panel  Tomorrow morning,   R        05/11/23 1227   05/12/23 0500  CBC  Tomorrow morning,   R        05/11/23 1227   05/11/23 1703  Technologist smear review  (Technologist smear review)  Add-on,   AD       Question:  Clinical information:  Answer:  anemia/ elevated retic count /  bliirubin.   05/11/23 1702   05/11/23 1703  Differential  (Technologist smear review)  Add-on,   AD        05/11/23 1702   05/11/23 1701  Lactate dehydrogenase  Add-on,   AD        05/11/23 1700   05/11/23 1701  Haptoglobin  Add-on,   AD        05/11/23 1700   05/11/23 1701  Hepatic function panel  Add-on,   AD        05/11/23 1700   05/11/23 1656  APTT  Add-on,   AD        05/11/23 1655   05/11/23 1655  Lactic acid, plasma  (Lactic Acid)  STAT Now then every 3 hours,   R      05/11/23 1654   05/11/23 1654  Blood gas, arterial  ONCE - STAT,   STAT        05/11/23 1654   05/11/23 1627  Basic metabolic panel  ONCE - STAT,   STAT        05/11/23 1626   05/11/23 1450  Iron and TIBC  Once,   AD        05/11/23 1450   05/11/23 1450  Ferritin  Once,   AD        05/11/23 1450   05/11/23 1210  Respiratory (~20 pathogens) panel by PCR  (Respiratory panel by PCR (~20 pathogens, ~24 hr TAT)  w precautions)  Add-on,   AD        05/11/23 1209   05/11/23 1050  Vitamin B12  (Anemia Panel (PNL))  Add-on,   AD        05/11/23 1049            Orders Placed This Encounter  Procedures   Resp panel by RT-PCR (RSV, Flu A&B, Covid) Anterior Nasal Swab   Respiratory (~20 pathogens) panel by PCR   DG Chest Portable 1 View   CT Angio Chest PE W and/or Wo Contrast   MR BRAIN WO CONTRAST   US PELVIC COMPLETE WITH TRANSVAGINAL   Lipase, blood   Comprehensive metabolic panel   CBC   Urinalysis, Routine w reflex microscopic -Urine, Clean Catch   hCG, serum, qualitative   Beta-hydroxybutyric acid   T4, free   TSH   Folate   Reticulocytes    Vitamin B12   Ethanol   Protime-INR   Comprehensive metabolic panel   CBC   CK   Procalcitonin   Comprehensive metabolic panel   Hemoglobin A1c   Sedimentation rate   Iron and TIBC   Ferritin   Basic metabolic panel   Blood gas, arterial   Lactic acid, plasma   APTT   Lactate dehydrogenase   Haptoglobin   Hepatic function panel   Technologist smear review   Differential   Diet full liquid Room service appropriate? Yes; Fluid consistency: Thin   Informed Consent Details: Physician/Practitioner Attestation; Transcribe to consent form and obtain  patient signature   CBC post transfusion - RN to place lab order with appropriate draw time   Apply Diabetes Mellitus Care Plan   STAT CBG when hypoglycemia is suspected. If treated, recheck every 15 minutes after each treatment until CBG >/= 70 mg/dl   Refer to Hypoglycemia Protocol Sidebar Report for treatment of CBG < 70 mg/dl   No HS correction Insulin   Place and maintain sequential compression device   Maintain IV access   Vital signs   Notify physician (specify)   Mobility Protocol: No Restrictions RN to initiate protocols based on patient's level of care   Refer to Sidebar Report Refer to ICU, Med-Surg, Progressive, and Step-Down Mobility Protocol Sidebars   Initiate Adult Central Line Maintenance and Catheter Protocol for patients with central line (CVC, PICC, Port, Hemodialysis, Trialysis)   Daily weights   Intake and Output   Do not place and if present remove PureWick   Initiate Oral Care Protocol   Initiate Carrier Fluid Protocol   RN may order General Admission PRN Orders utilizing "General Admission PRN medications" (through manage orders) for the following patient needs: allergy symptoms (Claritin), cold sores (Carmex), cough (Robitussin DM), eye irritation (Liquifilm Tears), hemorrhoids (Tucks), indigestion (Maalox), minor skin irritation (Hydrocortisone Cream), muscle pain Romeo Apple Gay), nose irritation (saline nasal spray)  and sore throat (Chloraseptic spray).   Cardiac Monitoring - Continuous Indefinite   Apply Diabetes Mellitus Care Plan   Full code   Consult to hospitalist   Droplet precaution   Pulse oximetry check with vital signs   Oxygen therapy Mode or (Route): Nasal cannula; Liters Per Minute: 2; Keep O2 saturation between: greater than 92 %   Incentive spirometry RT   I-Stat CG4 Lactic Acid   CBG monitoring, ED   Type and screen [facility]   Prepare RBC (crossmatch)   Admit to Inpatient (patient's expected length of stay will be greater than 2 midnights or inpatient only procedure)   Admit to Inpatient (patient's expected length of stay will be greater than 2 midnights or inpatient only procedure)   Aspiration precautions

## 2023-05-11 NOTE — ED Notes (Signed)
 Due to having higher acuity patients, task/meds had not been performed for this patient. When I became available, patient was off the floor for quite some time, going to [place] then to MRI. Once she returned, I obtained blood consent that physician had requested I obtain, and outstanding labs. Patient sent upstairs to room.

## 2023-05-11 NOTE — Progress Notes (Addendum)
 Received patient from ED, saturation to 70's on [facility], put on NRB, then HFNC 11, MD made aware, Rapid response made aware, and respiratory therapist also made aware.  HR, tachy, RR up tp 30's.   At bedside pt is dyspneic and in distress/ A/O and answering questions. D/W  on call ICU MD to see pt for respiratory distress. Pt has audible rales and made pt sit up supine. Stat ABG  Stat chest xray.  Stat duoneb.  Icu MD Dr.Asaker at bedside: -stat lasix 40 mg v / d/c po.  -stat steroids iv 40 mg bid first now. - stat NIPPV based on ABG.  - called daughter no answer.  -nasal MRSA.  - pt continued on rocephin and azithromycin will change to vancomycin based on nasal MRSA.

## 2023-05-11 NOTE — Significant Event (Signed)
 Rapid Response Event Note   Reason for Call :  Pt seen during rounds d/t respiratory distress.  Pt arrived from ED with SpO2-60% ON 2L [place]. Pt was placed on NRB then 11 L HFNC. MD notified and came to bedside:  ABG-7.29/34/34/16.3, sat-64.3(?venous sample, pt SpO2 96% on 11L HFNC at time of ABG draw) PCXR Duoneb Lasix 40mg  IV 40mg  solumedrol Bipap  Initial Focused Assessment:  Pt lying in bed with eyes open. Her breathing is tachpneic and labored. She is alert and oriented, c/o SOB that has improved some since she was placed on bipap, denies CP. Lungs with rhonchi/rales t/o all lung fields.Skin warm/dry.   T-98.7, HR-116, BP-148/73, RR-35, SpO2-100% on .60 bipap.  Interventions:  ABG-7.34/28/104/15.1 Vanco/Maxipime Plan of Care:  Despite labored breathing, pt is able to rest on bipap. Her PaO2 on her repeat ABG is ok. PCCM is aware of pt. Continue to monitor pt closely. Call RRT if further assistance needed.   Event Summary:   MD Notified: Crosley notified and came to bedside.  Call Time:2020 Arrival Time:2020 End Time:2050  Terrilyn Saver, RN

## 2023-05-11 NOTE — ED Provider Notes (Signed)
 [place] EMERGENCY DEPARTMENT AT [facility] Provider Note   CSN: [id number] Arrival date & time: 05/11/23  0531     History  Chief Complaint  Patient presents with   Abdominal Pain   Emesis   Headache    Melinda Stone is a 43 y.o. female.  The history is provided by the patient and medical records. No language interpreter was used.  Abdominal Pain Pain location:  Generalized Pain quality: aching   Pain radiates to:  Does not radiate Pain severity:  Mild Onset quality:  Gradual Duration:  2 days Timing:  Unable to specify Progression:  Unable to specify Chronicity:  New Context: sick contacts (flu positive)   Context: not trauma   Relieved by:  Nothing Worsened by:  Coughing Ineffective treatments:  None tried Associated symptoms: chills, cough, fatigue, fever, nausea and vomiting   Associated symptoms: no chest pain, no constipation, no diarrhea, no dysuria and no shortness of breath   Emesis Associated symptoms: abdominal pain, chills, cough, fever, headaches and myalgias   Associated symptoms: no diarrhea   Headache Associated symptoms: abdominal pain, congestion, cough, fatigue, fever, myalgias, nausea and vomiting   Associated symptoms: no back pain, no diarrhea, no neck pain, no neck stiffness and no numbness        Home Medications Prior to Admission medications   Medication Sig Start Date End Date Taking? Authorizing Provider  acetaminophen (TYLENOL) 500 MG tablet Take 500 mg by mouth every 6 (six) hours as needed for mild pain or moderate pain.    [provider]  amitriptyline (ELAVIL) 25 MG tablet Take 1 tablet (25 mg total) by mouth at bedtime. 01/31/23   Glean Salvo, NP  celecoxib (CELEBREX) 100 MG capsule Take 100 mg by mouth 2 (two) times daily. 01/21/23   [provider]  cholecalciferol (VITAMIN D3) 25 MCG (1000 UNIT) tablet Take 1,000 Units by mouth daily.    [provider]  Cyanocobalamin  (VITAMIN B-12) 2500 MCG SUBL Place 1 tablet (2,500 mcg total) under the tongue daily. 04/23/23   Rickard Patience, MD  furosemide (LASIX) 20 MG tablet Take 1 tablet (20 mg total) by mouth daily. 04/21/23   Prosperi, Christian H, PA-C  gabapentin (NEURONTIN) 800 MG tablet Take 800 mg by mouth 3 (three) times daily.    [provider]  insulin aspart (NOVOLOG) 100 UNIT/ML FlexPen Before each meal 3 times a day, 140-199 - 2 units, 200-250 - 4 units, 251-299 - 8 units,  300-349 - 10 units,  350 or above 12 units. 12/27/22   Leroy Sea, MD  insulin glargine (LANTUS SOLOSTAR) 100 UNIT/ML Solostar Pen Inject 30 Units into the skin at bedtime. 12/27/22   Leroy Sea, MD  methocarbamol (ROBAXIN) 500 MG tablet Take 1 tablet (500 mg total) by mouth 2 (two) times daily. Patient taking differently: Take 500 mg by mouth at bedtime. 01/12/23   Jeannie Fend, PA-C  metoprolol tartrate (LOPRESSOR) 25 MG tablet Take 1 tablet (25 mg total) by mouth 2 (two) times daily. 03/27/23 04/26/23  Dezii, Alexandra, DO  ondansetron (ZOFRAN-ODT) 4 MG disintegrating tablet Take 1 tablet (4 mg total) by mouth every 6 (six) hours as needed for nausea or vomiting. 04/21/23   Prosperi, Christian H, PA-C  oxyCODONE-acetaminophen (PERCOCET/ROXICET) 5-325 MG tablet Take 1 tablet by mouth every 6 (six) hours as needed for moderate pain or severe pain. 03/13/21   [provider]  tiZANidine (ZANAFLEX) 4 MG tablet Take 4  mg by mouth at bedtime. 02/17/23   [provider]  Ubrogepant (UBRELVY) 100 MG TABS Take 1 tablet (100 mg total) by mouth as needed. Take 1 tablet at onset of headache, may repeat in 2 hours if needed. Max is 200 mg in 24 hours. 01/31/23   Glean Salvo, NP  vitamin C (ASCORBIC ACID) 250 MG tablet Take 2 tablets (500 mg total) by mouth daily. Patient taking differently: Take 250 mg by mouth daily. 04/10/22   Rickard Patience, MD      Allergies    Trulicity [dulaglutide]    Review of Systems   Review of  Systems  Constitutional:  Positive for chills, fatigue and fever.  HENT:  Positive for congestion.   Respiratory:  Positive for cough. Negative for chest tightness and shortness of breath.   Cardiovascular:  Negative for chest pain and palpitations.  Gastrointestinal:  Positive for abdominal pain, nausea and vomiting. Negative for constipation and diarrhea.  Genitourinary:  Negative for dysuria, enuresis, flank pain and frequency.  Musculoskeletal:  Positive for myalgias. Negative for back pain, neck pain and neck stiffness.  Skin:  Negative for rash and wound.  Neurological:  Positive for headaches. Negative for light-headedness and numbness.  Psychiatric/Behavioral:  Negative for agitation and confusion.   All other systems reviewed and are negative.   Physical Exam Updated Vital Signs BP (!) 164/74 (BP Location: Right Arm)   Pulse (!) 116   Temp 100.3 F (37.9 C)   Resp 18   Ht 5\' 9"  (1.753 m)   Wt 97.5 kg   LMP 04/01/2023   SpO2 90%   BMI 31.75 kg/m  Physical Exam Vitals and nursing note reviewed.  Constitutional:      General: She is not in acute distress.    Appearance: She is well-developed. She is not ill-appearing, toxic-appearing or diaphoretic.  HENT:     Head: Normocephalic and atraumatic.     Nose: Congestion present. No rhinorrhea.     Mouth/Throat:     Mouth: Mucous membranes are dry.     Pharynx: No oropharyngeal exudate or posterior oropharyngeal erythema.  Eyes:     Extraocular Movements: Extraocular movements intact.     Conjunctiva/sclera: Conjunctivae normal.     Pupils: Pupils are equal, round, and reactive to light.  Cardiovascular:     Rate and Rhythm: Regular rhythm. Tachycardia present.     Heart sounds: No murmur heard. Pulmonary:     Effort: Pulmonary effort is normal. No respiratory distress.     Breath sounds: Normal breath sounds. No wheezing, rhonchi or rales.  Chest:     Chest wall: No tenderness.  Abdominal:     General: Abdomen is  flat. There is no distension.     Palpations: Abdomen is soft.     Tenderness: There is no abdominal tenderness. There is no right CVA tenderness, left CVA tenderness, guarding or rebound.  Musculoskeletal:        General: Tenderness (muscle soreness diffusely) present. No swelling.     Cervical back: Neck supple. No tenderness.     Right lower leg: No edema.     Left lower leg: No edema.  Skin:    General: Skin is warm and dry.     Capillary Refill: Capillary refill takes less than 2 seconds.     Findings: No erythema or rash.  Neurological:     General: No focal deficit present.     Mental Status: She is alert.  Sensory: No sensory deficit.     Motor: No weakness.  Psychiatric:        Mood and Affect: Mood normal.     ED Results / Procedures / Treatments   Labs (all labs ordered are listed, but only abnormal results are displayed) Labs Reviewed  COMPREHENSIVE METABOLIC PANEL - Abnormal; Notable for the following components:      Result Value   Potassium 5.4 (*)    Chloride 112 (*)    CO2 18 (*)    Glucose, Bld 280 (*)    Creatinine, Ser 1.04 (*)    Calcium 8.3 (*)    Total Protein 5.9 (*)    Albumin 3.0 (*)    Total Bilirubin 3.2 (*)    All other components within normal limits  CBC - Abnormal; Notable for the following components:   RBC 2.44 (*)    Hemoglobin 6.9 (*)    HCT 20.3 (*)    RDW 19.9 (*)    Platelets 138 (*)    All other components within normal limits  URINALYSIS, ROUTINE W REFLEX MICROSCOPIC - Abnormal; Notable for the following components:   Color, Urine AMBER (*)    APPearance HAZY (*)    Glucose, UA 50 (*)    Hgb urine dipstick LARGE (*)    Protein, ur >=300 (*)    Bacteria, UA RARE (*)    All other components within normal limits  RETICULOCYTES - Abnormal; Notable for the following components:   Retic Ct Pct 12.3 (*)    RBC. 2.36 (*)    Retic Count, Absolute 284.6 (*)    All other components within normal limits  COMPREHENSIVE METABOLIC  PANEL - Abnormal; Notable for the following components:   Potassium 5.2 (*)    Chloride 114 (*)    CO2 17 (*)    Glucose, Bld 284 (*)    Creatinine, Ser 1.07 (*)    Calcium 8.2 (*)    Total Protein 6.0 (*)    Albumin 2.9 (*)    Total Bilirubin 3.2 (*)    All other components within normal limits  CBG MONITORING, ED - Abnormal; Notable for the following components:   Glucose-Capillary 275 (*)    All other components within normal limits  RESP PANEL BY RT-PCR (RSV, FLU A&B, COVID)  RVPGX2  RESPIRATORY PANEL BY PCR  LIPASE, BLOOD  HCG, SERUM, QUALITATIVE  BETA-HYDROXYBUTYRIC ACID  T4, FREE  TSH  FOLATE  ETHANOL  PROTIME-INR  CK  PROCALCITONIN  HEMOGLOBIN A1C  VITAMIN B12  SEDIMENTATION RATE  IRON AND TIBC  FERRITIN  I-STAT CG4 LACTIC ACID, ED  I-STAT CG4 LACTIC ACID, ED  TYPE AND SCREEN  PREPARE RBC (CROSSMATCH)  TROPONIN I (HIGH SENSITIVITY)  TROPONIN I (HIGH SENSITIVITY)    EKG None  Radiology MR BRAIN WO CONTRAST Result Date: 05/11/2023 CLINICAL DATA:  Headache, new onset (Age >= 51y) EXAM: MRI HEAD WITHOUT CONTRAST TECHNIQUE: Multiplanar, multiecho pulse sequences of the brain and surrounding structures were obtained without intravenous contrast. COMPARISON:  None Available. FINDINGS: Brain: Negative for an acute infarct. No hemorrhage. No hydrocephalus. No extra-axial fluid collection. No mass effect. No mass lesion. There are scattered periventricular and subcortical T2/FLAIR hyperintense lesions, favored to represent sequela of chronic microvascular ischemic change. Vascular: Normal flow voids. Skull and upper cervical spine: Normal marrow signal. Sinuses/Orbits: No middle ear or mastoid effusion. Paranasal sinuses are notable for mild pansinus mucosal thickening. Orbits are unremarkable. Other: None. IMPRESSION: No acute intracranial process. No specific  etiology for headaches identified. Electronically Signed   By: Lorenza Cambridge M.D.   On: 05/11/2023 14:41   US  PELVIC COMPLETE WITH TRANSVAGINAL Result Date: 05/11/2023 CLINICAL DATA:  [id number] Abdominal pain [id number] Endometrial mass 555419. Last menstrual period 04/25/2023 EXAM: TRANSABDOMINAL AND TRANSVAGINAL ULTRASOUND OF PELVIS TECHNIQUE: Both transabdominal and transvaginal ultrasound examinations of the pelvis were performed. Transabdominal technique was performed for global imaging of the pelvis including uterus, ovaries, adnexal regions, and pelvic cul-de-sac. It was necessary to proceed with endovaginal exam following the transabdominal exam to visualize the endometrium and bilateral ovaries. COMPARISON:  CT abdomen pelvis 02/09/2023 FINDINGS: Uterus Measurements: 9.3 x 5.9 x 7.5 cm = volume: 215.2 mL. 3.8 x 3.1 x 3 cm heterogeneous posterior right intramural mass likely uterine fibroid. Endometrium Thickness: 9 mm.  No focal abnormality visualized. Right ovary Measurements: 3.2 x 2 x 2.1 cm = volume: 7.1 mL. The right ovary is unremarkable. There is a tubular like anechoic structure within the right adnexa. Left ovary Measurements: 4.1 x 1.8 x 3.7 cm = volume: 14.1 mL. Normal appearance/no adnexal mass. Other findings Small volume simple free fluid within the pelvis. IMPRESSION: 1. Suggestion of right hydrosalpinx. 2. Trace simple free fluid within the pelvis. 3. Intramural uterine fibroid. Electronically Signed   By: Tish Frederickson M.D.   On: 05/11/2023 13:51   CT Angio Chest PE W and/or Wo Contrast Result Date: 05/11/2023 CLINICAL DATA:  Four day history of fever, cough, generalized body aches EXAM: CT ANGIOGRAPHY CHEST WITH CONTRAST TECHNIQUE: Multidetector CT imaging of the chest was performed using the standard protocol during bolus administration of intravenous contrast. Multiplanar CT image reconstructions and MIPs were obtained to evaluate the vascular anatomy. RADIATION DOSE REDUCTION: This exam was performed according to the departmental dose-optimization program which includes automated exposure  control, adjustment of the mA and/or kV according to patient size and/or use of iterative reconstruction technique. CONTRAST:  75mL OMNIPAQUE IOHEXOL 350 MG/ML SOLN COMPARISON:  Same day chest radiograph, CTA chest dated 03/25/2023 FINDINGS: Cardiovascular: The study is adequate for the evaluation of pulmonary embolism to the level of the proximal subsegmental arteries due to contrast bolus timing and motion artifact. There are no filling defects in the central, lobar, segmental or proximal subsegmental pulmonary artery branches to suggest acute pulmonary embolism. Mildly dilated main pulmonary artery measures 3.4 cm in diameter, unchanged. Aberrant right subclavian artery. Normal heart size. No significant pericardial fluid/thickening. Mediastinum/Nodes: Imaged thyroid gland without nodules meeting criteria for imaging follow-up by size. Normal esophagus. Increased multi station mediastinal and hilar lymphadenopathy, for example 13 mm right low pretracheal (5:47), previously 9 mm, 20 mm right hilar (5:54), previously 14 mm, and 21 mm subcarinal (5:62), previously 14 mm. Lungs/Pleura: Trace layering secretions within the trachea and extending into left main bronchus. Mild diffuse bronchial wall thickening. Asymmetric right-greater-than-left interlobular septal thickening. New right lung predominant consolidative and nodular opacities within the right upper, middle, and lower lobes. Minimal ground-glass nodules within the left lower lobe. Previously noted peribronchovascular ground-glass opacities are no longer seen. No pneumothorax. Trace right pleural effusion. Upper abdomen: Mildly dilated hepatic veins measure up to 19 mm. Enlarged spleen measures 15.2 cm in AP dimension, previously 13.5 cm. Musculoskeletal: No acute or abnormal lytic or blastic osseous lesions. Review of the MIP images confirms the above findings. IMPRESSION: 1. No evidence of pulmonary embolism to the level of the proximal subsegmental  pulmonary arteries. 2. New right lung-predominant consolidative and nodular opacities, likely multifocal pneumonia. 3. Asymmetric right-greater-than-left  interlobular septal thickening and trace right pleural effusion, likely pulmonary edema. 4. Increased multi station mediastinal and hilar lymphadenopathy, likely reactive. 5. Mildly dilated hepatic veins and spleen, which can be seen in the setting of right heart dysfunction. 6. Mildly dilated main pulmonary artery, which can be seen in the setting of pulmonary hypertension. Electronically Signed   By: Agustin Cree M.D.   On: 05/11/2023 11:15   DG Chest Portable 1 View Result Date: 05/11/2023 CLINICAL DATA:  Cough, fever. EXAM: PORTABLE CHEST 1 VIEW COMPARISON:  April 21, 2023. FINDINGS: Stable cardiomegaly. Mild central pulmonary vascular congestion is noted. Mild right upper lobe airspace opacity is noted concerning for pneumonia. Bony thorax is unremarkable. IMPRESSION: Stable cardiomegaly with mild central pulmonary vascular congestion. Mild right upper lobe opacity is noted concerning for pneumonia. Followup PA and lateral chest X-ray is recommended in 3-4 weeks following trial of antibiotic therapy to ensure resolution and exclude underlying malignancy. Electronically Signed   By: Lupita Raider M.D.   On: 05/11/2023 09:20    Procedures Procedures    CRITICAL CARE Performed by: Canary Brim Julius Matus Total critical care time: 35 minutes Critical care time was exclusive of separately billable procedures and treating other patients. Critical care was necessary to treat or prevent imminent or life-threatening deterioration. Critical care was time spent personally by me on the following activities: development of treatment plan with patient and/or surrogate as well as nursing, discussions with consultants, evaluation of patient's response to treatment, examination of patient, obtaining history from patient or surrogate, ordering and performing  treatments and interventions, ordering and review of laboratory studies, ordering and review of radiographic studies, pulse oximetry and re-evaluation of patient's condition.  Medications Ordered in ED Medications  cefTRIAXone (ROCEPHIN) 1 g in sodium chloride 0.9 % 100 mL IVPB (has no administration in time range)  azithromycin (ZITHROMAX) 500 mg in sodium chloride 0.9 % 250 mL IVPB (has no administration in time range)  ondansetron (ZOFRAN-ODT) disintegrating tablet 4 mg (4 mg Oral Given 05/11/23 0606)  prochlorperazine (COMPAZINE) injection 10 mg (10 mg Intravenous Given 05/11/23 0843)  diphenhydrAMINE (BENADRYL) injection 50 mg (50 mg Intravenous Given 05/11/23 0843)  sodium chloride 0.9 % bolus 1,000 mL (0 mLs Intravenous Stopped 05/11/23 0950)  acetaminophen (TYLENOL) tablet 650 mg (650 mg Oral Given 05/11/23 0845)    ED Course/ Medical Decision Making/ A&P                                 Medical Decision Making Amount and/or Complexity of Data Reviewed Labs: ordered. Radiology: ordered.  Risk OTC drugs. Prescription drug management. Decision regarding hospitalization.    Caddie L Collett is a 43 y.o. female with a past medical history significant for documented chronic pain syndrome, diabetes, chronic anemia, migraines, GERD, ovarian cyst, previous cholecystectomy and previous lumbar laminectomy, and arthritis who presents with fevers, chills, malaise, myalgias, diffuse bodyaches, nausea, vomiting, and headache.  According to patient, her son tested positive for the flu and she has had similar symptoms to him since yesterday.  She previously has needed blood transfusion for anemia in the setting of menstrual cycle but she is not on her cycle now.  She reports her symptoms began yesterday and she think she has the flu.  She reports her cough has been mild and nonproductive.  She is not reporting any constipation, diarrhea, or urinary changes.  She is reporting some abdominal  cramping does not  severe.  She has had some nausea and vomiting and dry mouth.  She is feeling somewhat dehydrated.  Is not reporting neck pain or neck stiffness.  On exam, lungs clear with no rhonchi or rales.  Chest is nontender.  Abdomen was nontender.  Bowel sounds were appreciated.  Patient has muscle soreness on exam throughout her body however she had intact sensation and strength in extremities.  Good pulses.  Membranes are dry.  No focal neurologic deficits initially.  Pupils symmetric and reactive.  No evidence of trauma.  Given this report that she has similar symptoms to her some of the flu I suspect she has the flu.  She technically tested negative however I suspect this is a false negative given the amount of influenza in the community and episodes of false negatives.  Her workup from triage did not reveal DKA.  Lipase not elevated.  She is not pregnant.  She does have hemoglobin 6.9 similar to what it has been in the past and she reports she is not currently bleeding.  We had a shared decision-making conversation and agreed to hold on blood transfusion at this time.  Her metabolic panel shows elevated glucose but no DKA.  Urinalysis does not show UTI with no nitrites or leukocytes.  With her shared decision-making conversation, although she was not having chest pain, shortness of breath production of her cough, and her breath sounds were clear, we agreed to get a chest x-ray given the recent admission for pneumonia. Will give her headache cocktail with some fluids and reassess.  If she is feeling better, anticipate discharge with prescription for nausea medicine and Tamiflu as I have such a high clinical suspicion for influenza and her symptoms began just yesterday.  If she is feeling worse and not improving, we agreed to then reassess if she needs more extensive workup or even blood transfusion.  Patient agrees with this plan, anticipate reassessment.  9:58 AM Workup continues to return.   Patient is now febrile and oxygen saturations have deteriorated down to around 90%.  She is not having shortness of breath.  Will start her on 2 L.  Oxygen saturation improved into the 90s.  X-ray returned showing evidence of new pneumonia.  Given the recent admission, her anemia, I suspect she has pneumonia in the setting of seronegative influenza given the exposure at home however with the breathing worsening and oxygen saturation is low, will admit for further management         Final Clinical Impression(s) / ED Diagnoses Final diagnoses:  Pneumonia due to infectious organism, unspecified laterality, unspecified part of lung    Clinical Impression: 1. Pneumonia due to infectious organism, unspecified laterality, unspecified part of lung     Disposition: Admit  This note was prepared with assistance of Dragon voice recognition software. Occasional wrong-word or sound-a-like substitutions may have occurred due to the inherent limitations of voice recognition software.     Taje Littler, Canary Brim, MD 05/11/23 [phone]

## 2023-05-12 ENCOUNTER — Inpatient Hospital Stay (HOSPITAL_COMMUNITY): Payer: Medicaid Other

## 2023-05-12 DIAGNOSIS — J80 Acute respiratory distress syndrome: Secondary | ICD-10-CM

## 2023-05-12 DIAGNOSIS — J09X1 Influenza due to identified novel influenza A virus with pneumonia: Secondary | ICD-10-CM | POA: Diagnosis not present

## 2023-05-12 DIAGNOSIS — J9601 Acute respiratory failure with hypoxia: Secondary | ICD-10-CM | POA: Diagnosis present

## 2023-05-12 DIAGNOSIS — D649 Anemia, unspecified: Secondary | ICD-10-CM

## 2023-05-12 DIAGNOSIS — I2699 Other pulmonary embolism without acute cor pulmonale: Secondary | ICD-10-CM

## 2023-05-12 DIAGNOSIS — N179 Acute kidney failure, unspecified: Secondary | ICD-10-CM

## 2023-05-12 DIAGNOSIS — A419 Sepsis, unspecified organism: Secondary | ICD-10-CM

## 2023-05-12 DIAGNOSIS — J101 Influenza due to other identified influenza virus with other respiratory manifestations: Secondary | ICD-10-CM

## 2023-05-12 DIAGNOSIS — Z9911 Dependence on respirator [ventilator] status: Secondary | ICD-10-CM

## 2023-05-12 DIAGNOSIS — J189 Pneumonia, unspecified organism: Secondary | ICD-10-CM

## 2023-05-12 LAB — TECHNOLOGIST SMEAR REVIEW

## 2023-05-12 LAB — CK: Total CK: 142 U/L (ref 38–234)

## 2023-05-12 LAB — CBC
HCT: 17.8 % — ABNORMAL LOW (ref 36.0–46.0)
HCT: 20.4 % — ABNORMAL LOW (ref 36.0–46.0)
HCT: 23.1 % — ABNORMAL LOW (ref 36.0–46.0)
Hemoglobin: 6.2 g/dL — CL (ref 12.0–15.0)
Hemoglobin: 7 g/dL — ABNORMAL LOW (ref 12.0–15.0)
Hemoglobin: 7.7 g/dL — ABNORMAL LOW (ref 12.0–15.0)
MCH: 28.2 pg (ref 26.0–34.0)
MCH: 28.1 pg (ref 26.0–34.0)
MCH: 27.9 pg (ref 26.0–34.0)
MCHC: 34.8 g/dL (ref 30.0–36.0)
MCHC: 34.3 g/dL (ref 30.0–36.0)
MCHC: 33.3 g/dL (ref 30.0–36.0)
MCV: 80.9 fL (ref 80.0–100.0)
MCV: 81.9 fL (ref 80.0–100.0)
MCV: 83.7 fL (ref 80.0–100.0)
Platelets: 103 10*3/uL — ABNORMAL LOW (ref 150–400)
Platelets: 111 10*3/uL — ABNORMAL LOW (ref 150–400)
Platelets: 135 10*3/uL — ABNORMAL LOW (ref 150–400)
RBC: 2.2 MIL/uL — ABNORMAL LOW (ref 3.87–5.11)
RBC: 2.49 MIL/uL — ABNORMAL LOW (ref 3.87–5.11)
RBC: 2.76 MIL/uL — ABNORMAL LOW (ref 3.87–5.11)
RDW: 19.8 % — ABNORMAL HIGH (ref 11.5–15.5)
RDW: 19.7 % — ABNORMAL HIGH (ref 11.5–15.5)
RDW: 20.2 % — ABNORMAL HIGH (ref 11.5–15.5)
WBC: 7.2 10*3/uL (ref 4.0–10.5)
WBC: 9.2 10*3/uL (ref 4.0–10.5)
WBC: 12.2 10*3/uL — ABNORMAL HIGH (ref 4.0–10.5)
nRBC: 0 % (ref 0.0–0.2)
nRBC: 0.2 % (ref 0.0–0.2)
nRBC: 0.2 % (ref 0.0–0.2)

## 2023-05-12 LAB — POCT I-STAT 7, (LYTES, BLD GAS, ICA,H+H)
Acid-base deficit: 9 mmol/L — ABNORMAL HIGH (ref 0.0–2.0)
Acid-base deficit: 10 mmol/L — ABNORMAL HIGH (ref 0.0–2.0)
Acid-base deficit: 9 mmol/L — ABNORMAL HIGH (ref 0.0–2.0)
Acid-base deficit: 7 mmol/L — ABNORMAL HIGH (ref 0.0–2.0)
Bicarbonate: 15.7 mmol/L — ABNORMAL LOW (ref 20.0–28.0)
Bicarbonate: 19.7 mmol/L — ABNORMAL LOW (ref 20.0–28.0)
Bicarbonate: 20 mmol/L (ref 20.0–28.0)
Bicarbonate: 20.8 mmol/L (ref 20.0–28.0)
Calcium, Ion: 1.17 mmol/L (ref 1.15–1.40)
Calcium, Ion: 1.24 mmol/L (ref 1.15–1.40)
Calcium, Ion: 1.25 mmol/L (ref 1.15–1.40)
Calcium, Ion: 1.15 mmol/L (ref 1.15–1.40)
HCT: 21 % — ABNORMAL LOW (ref 36.0–46.0)
HCT: 21 % — ABNORMAL LOW (ref 36.0–46.0)
HCT: 24 % — ABNORMAL LOW (ref 36.0–46.0)
HCT: 18 % — ABNORMAL LOW (ref 36.0–46.0)
Hemoglobin: 7.1 g/dL — ABNORMAL LOW (ref 12.0–15.0)
Hemoglobin: 7.1 g/dL — ABNORMAL LOW (ref 12.0–15.0)
Hemoglobin: 8.2 g/dL — ABNORMAL LOW (ref 12.0–15.0)
Hemoglobin: 6.1 g/dL — CL (ref 12.0–15.0)
O2 Saturation: 91 %
O2 Saturation: 53 %
O2 Saturation: 99 %
O2 Saturation: 99 %
Patient temperature: 100.6
Patient temperature: 98.5
Patient temperature: 98.5
Patient temperature: 37.9
Potassium: 5.3 mmol/L — ABNORMAL HIGH (ref 3.5–5.1)
Potassium: 4.5 mmol/L (ref 3.5–5.1)
Potassium: 5 mmol/L (ref 3.5–5.1)
Potassium: 5.1 mmol/L (ref 3.5–5.1)
Sodium: 137 mmol/L (ref 135–145)
Sodium: 140 mmol/L (ref 135–145)
Sodium: 142 mmol/L (ref 135–145)
Sodium: 139 mmol/L (ref 135–145)
TCO2: 17 mmol/L — ABNORMAL LOW (ref 22–32)
TCO2: 22 mmol/L (ref 22–32)
TCO2: 22 mmol/L (ref 22–32)
TCO2: 22 mmol/L (ref 22–32)
pCO2 arterial: 29.8 mm[Hg] — ABNORMAL LOW (ref 32–48)
pCO2 arterial: 59.1 mm[Hg] — ABNORMAL HIGH (ref 32–48)
pCO2 arterial: 62 mm[Hg] — ABNORMAL HIGH (ref 32–48)
pCO2 arterial: 53.9 mm[Hg] — ABNORMAL HIGH (ref 32–48)
pH, Arterial: 7.335 — ABNORMAL LOW (ref 7.35–7.45)
pH, Arterial: 7.109 — CL (ref 7.35–7.45)
pH, Arterial: 7.138 — CL (ref 7.35–7.45)
pH, Arterial: 7.199 — CL (ref 7.35–7.45)
pO2, Arterial: 67 mm[Hg] — ABNORMAL LOW (ref 83–108)
pO2, Arterial: 193 mm[Hg] — ABNORMAL HIGH (ref 83–108)
pO2, Arterial: 38 mm[Hg] — CL (ref 83–108)
pO2, Arterial: 172 mm[Hg] — ABNORMAL HIGH (ref 83–108)

## 2023-05-12 LAB — COMPREHENSIVE METABOLIC PANEL
ALT: 29 U/L (ref 0–44)
AST: 38 U/L (ref 15–41)
Albumin: 2.8 g/dL — ABNORMAL LOW (ref 3.5–5.0)
Alkaline Phosphatase: 122 U/L (ref 38–126)
Anion gap: 8 (ref 5–15)
BUN: 27 mg/dL — ABNORMAL HIGH (ref 6–20)
CO2: 15 mmol/L — ABNORMAL LOW (ref 22–32)
Calcium: 7.7 mg/dL — ABNORMAL LOW (ref 8.9–10.3)
Chloride: 110 mmol/L (ref 98–111)
Creatinine, Ser: 1.4 mg/dL — ABNORMAL HIGH (ref 0.44–1.00)
GFR, Estimated: 48 mL/min — ABNORMAL LOW (ref >60)
Glucose, Bld: 394 mg/dL — ABNORMAL HIGH (ref 70–99)
Potassium: 4.8 mmol/L (ref 3.5–5.1)
Sodium: 133 mmol/L — ABNORMAL LOW (ref 135–145)
Total Bilirubin: 1.7 mg/dL — ABNORMAL HIGH (ref 0.0–1.2)
Total Protein: 5.8 g/dL — ABNORMAL LOW (ref 6.5–8.1)

## 2023-05-12 LAB — PROCALCITONIN: Procalcitonin: 1.33 ng/mL

## 2023-05-12 LAB — GLUCOSE, CAPILLARY
Glucose-Capillary: 363 mg/dL — ABNORMAL HIGH (ref 70–99)
Glucose-Capillary: 350 mg/dL — ABNORMAL HIGH (ref 70–99)
Glucose-Capillary: 333 mg/dL — ABNORMAL HIGH (ref 70–99)
Glucose-Capillary: 194 mg/dL — ABNORMAL HIGH (ref 70–99)
Glucose-Capillary: 238 mg/dL — ABNORMAL HIGH (ref 70–99)
Glucose-Capillary: 306 mg/dL — ABNORMAL HIGH (ref 70–99)
Glucose-Capillary: 354 mg/dL — ABNORMAL HIGH (ref 70–99)
Glucose-Capillary: 359 mg/dL — ABNORMAL HIGH (ref 70–99)

## 2023-05-12 LAB — DIC (DISSEMINATED INTRAVASCULAR COAGULATION)PANEL
D-Dimer, Quant: 2.99 ug{FEU}/mL — ABNORMAL HIGH (ref 0.00–0.50)
Fibrinogen: 303 mg/dL (ref 210–475)
INR: 1.2 (ref 0.8–1.2)
Platelets: 113 10*3/uL — ABNORMAL LOW (ref 150–400)
Prothrombin Time: 15.2 s (ref 11.4–15.2)
Smear Review: NONE SEEN
aPTT: 35 s (ref 24–36)

## 2023-05-12 LAB — BRAIN NATRIURETIC PEPTIDE: B Natriuretic Peptide: 1082.1 pg/mL — ABNORMAL HIGH (ref 0.0–100.0)

## 2023-05-12 LAB — LACTIC ACID, PLASMA
Lactic Acid, Venous: 1.5 mmol/L (ref 0.5–1.9)
Lactic Acid, Venous: 1.7 mmol/L (ref 0.5–1.9)

## 2023-05-12 LAB — BASIC METABOLIC PANEL
Anion gap: 13 (ref 5–15)
BUN: 31 mg/dL — ABNORMAL HIGH (ref 6–20)
CO2: 15 mmol/L — ABNORMAL LOW (ref 22–32)
Calcium: 8 mg/dL — ABNORMAL LOW (ref 8.9–10.3)
Chloride: 112 mmol/L — ABNORMAL HIGH (ref 98–111)
Creatinine, Ser: 1.55 mg/dL — ABNORMAL HIGH (ref 0.44–1.00)
GFR, Estimated: 43 mL/min — ABNORMAL LOW (ref >60)
Glucose, Bld: 194 mg/dL — ABNORMAL HIGH (ref 70–99)
Potassium: 4.5 mmol/L (ref 3.5–5.1)
Sodium: 140 mmol/L (ref 135–145)

## 2023-05-12 LAB — PREPARE RBC (CROSSMATCH)

## 2023-05-12 LAB — TROPONIN I (HIGH SENSITIVITY): Troponin I (High Sensitivity): 108 ng/L (ref <18)

## 2023-05-12 LAB — FIBRINOGEN: Fibrinogen: 341 mg/dL (ref 210–475)

## 2023-05-12 LAB — MAGNESIUM: Magnesium: 2.2 mg/dL (ref 1.7–2.4)

## 2023-05-12 MED ORDER — INSULIN ASPART 100 UNIT/ML IJ SOLN
5.0000 [IU] | Freq: Once | INTRAMUSCULAR | Status: AC
  Administered 2023-05-12: 5 [IU] via SUBCUTANEOUS

## 2023-05-12 MED ORDER — SODIUM CHLORIDE 0.9 % IV SOLN
2.0000 g | Freq: Every day | INTRAVENOUS | Status: AC
  Administered 2023-05-12 – 2023-05-17 (×6): 2 g via INTRAVENOUS
  Filled 2023-05-12 (×6): qty 20

## 2023-05-12 MED ORDER — REVEFENACIN 175 MCG/3ML IN SOLN
175.0000 ug | Freq: Every day | RESPIRATORY_TRACT | Status: DC
  Administered 2023-05-12 – 2023-05-23 (×12): 175 ug via RESPIRATORY_TRACT
  Filled 2023-05-12 (×12): qty 3

## 2023-05-12 MED ORDER — INSULIN ASPART 100 UNIT/ML IJ SOLN: 3.0000 [IU] | INTRAMUSCULAR | Status: DC

## 2023-05-12 MED ORDER — FENTANYL 2500MCG IN NS 250ML (10MCG/ML) PREMIX INFUSION
50.0000 ug/h | INTRAVENOUS | Status: DC
  Administered 2023-05-12: 50 ug/h via INTRAVENOUS
  Filled 2023-05-12: qty 250

## 2023-05-12 MED ORDER — SODIUM BICARBONATE 8.4 % IV SOLN
INTRAVENOUS | Status: AC
  Administered 2023-05-12: 50 meq
  Filled 2023-05-12: qty 50

## 2023-05-12 MED ORDER — ORAL CARE MOUTH RINSE: 15.0000 mL | OROMUCOSAL | Status: DC

## 2023-05-12 MED ORDER — FENTANYL CITRATE PF 50 MCG/ML IJ SOSY
PREFILLED_SYRINGE | INTRAMUSCULAR | Status: AC
Start: 2023-05-12 — End: 2023-05-12
  Filled 2023-05-12: qty 2

## 2023-05-12 MED ORDER — SODIUM CHLORIDE 0.9 % IV SOLN
INTRAVENOUS | Status: AC | PRN
Start: 2023-05-12 — End: 2023-05-13

## 2023-05-12 MED ORDER — POLYETHYLENE GLYCOL 3350 17 G PO PACK
17.0000 g | PACK | Freq: Every day | ORAL | Status: DC
  Administered 2023-05-12 – 2023-05-19 (×6): 17 g
  Filled 2023-05-12 (×6): qty 1

## 2023-05-12 MED ORDER — MIDAZOLAM BOLUS VIA INFUSION
1.0000 mg | INTRAVENOUS | Status: DC | PRN
  Administered 2023-05-14 – 2023-05-15 (×6): 2 mg via INTRAVENOUS

## 2023-05-12 MED ORDER — FUROSEMIDE 10 MG/ML IJ SOLN
20.0000 mg | Freq: Once | INTRAMUSCULAR | Status: AC
  Administered 2023-05-12: 20 mg via INTRAVENOUS
  Filled 2023-05-12: qty 2

## 2023-05-12 MED ORDER — MIDAZOLAM HCL 2 MG/2ML IJ SOLN: 1.0000 mg | INTRAMUSCULAR | Status: DC | PRN

## 2023-05-12 MED ORDER — VITAL HIGH PROTEIN PO LIQD
1000.0000 mL | ORAL | Status: DC
  Administered 2023-05-12: 1000 mL

## 2023-05-12 MED ORDER — ARTIFICIAL TEARS OPHTHALMIC OINT
1.0000 | TOPICAL_OINTMENT | Freq: Three times a day (TID) | OPHTHALMIC | Status: DC
  Administered 2023-05-12 – 2023-05-14 (×7): 1 via OPHTHALMIC
  Filled 2023-05-12 (×3): qty 3.5

## 2023-05-12 MED ORDER — NOREPINEPHRINE 4 MG/250ML-% IV SOLN
INTRAVENOUS | Status: AC
  Filled 2023-05-12: qty 250

## 2023-05-12 MED ORDER — PROPOFOL 1000 MG/100ML IV EMUL
0.0000 ug/kg/min | INTRAVENOUS | Status: DC
  Administered 2023-05-12: 40 ug/kg/min via INTRAVENOUS
  Administered 2023-05-12: 30 ug/kg/min via INTRAVENOUS
  Administered 2023-05-12: 45 ug/kg/min via INTRAVENOUS
  Administered 2023-05-12: 5 ug/kg/min via INTRAVENOUS
  Administered 2023-05-13 (×4): 30 ug/kg/min via INTRAVENOUS
  Administered 2023-05-14: 40 ug/kg/min via INTRAVENOUS
  Administered 2023-05-14 (×2): 20 ug/kg/min via INTRAVENOUS
  Administered 2023-05-14 – 2023-05-15 (×2): 50 ug/kg/min via INTRAVENOUS
  Filled 2023-05-12 (×13): qty 100

## 2023-05-12 MED ORDER — FUROSEMIDE 10 MG/ML IJ SOLN: 40.0000 mg | Freq: Once | INTRAMUSCULAR | Status: DC

## 2023-05-12 MED ORDER — CISATRACURIUM BOLUS VIA INFUSION
0.1000 mg/kg | Freq: Once | INTRAVENOUS | Status: AC
  Administered 2023-05-12: 9.8 mg via INTRAVENOUS
  Filled 2023-05-12: qty 10

## 2023-05-12 MED ORDER — SODIUM BICARBONATE 8.4 % IV SOLN
INTRAVENOUS | Status: AC
  Filled 2023-05-12: qty 50

## 2023-05-12 MED ORDER — FENTANYL CITRATE PF 50 MCG/ML IJ SOSY
100.0000 ug | PREFILLED_SYRINGE | Freq: Once | INTRAMUSCULAR | Status: AC
  Administered 2023-05-12: 100 ug via INTRAVENOUS
  Filled 2023-05-12: qty 2

## 2023-05-12 MED ORDER — ADULT MULTIVITAMIN LIQUID CH
15.0000 mL | Freq: Every day | ORAL | Status: DC
  Administered 2023-05-12 – 2023-05-21 (×10): 15 mL
  Filled 2023-05-12 (×11): qty 15

## 2023-05-12 MED ORDER — SODIUM CHLORIDE 0.9% IV SOLUTION: Freq: Once | INTRAVENOUS | Status: AC

## 2023-05-12 MED ORDER — CLONAZEPAM 1 MG PO TABS
2.0000 mg | ORAL_TABLET | Freq: Two times a day (BID) | ORAL | Status: DC
  Administered 2023-05-12 – 2023-05-17 (×12): 2 mg
  Filled 2023-05-12 (×12): qty 2

## 2023-05-12 MED ORDER — NITROGLYCERIN IN D5W 200-5 MCG/ML-% IV SOLN
0.0000 ug/min | INTRAVENOUS | Status: DC
  Administered 2023-05-12: 5 ug/min via INTRAVENOUS
  Filled 2023-05-12: qty 250

## 2023-05-12 MED ORDER — MIDAZOLAM HCL 2 MG/2ML IJ SOLN
4.0000 mg | Freq: Once | INTRAMUSCULAR | Status: AC
  Administered 2023-05-12: 4 mg via INTRAVENOUS

## 2023-05-12 MED ORDER — ROCURONIUM BROMIDE 10 MG/ML (PF) SYRINGE
100.0000 mg | PREFILLED_SYRINGE | Freq: Once | INTRAVENOUS | Status: AC
  Administered 2023-05-12: 100 mg via INTRAVENOUS

## 2023-05-12 MED ORDER — OXYCODONE HCL 5 MG PO TABS
10.0000 mg | ORAL_TABLET | Freq: Four times a day (QID) | ORAL | Status: DC
  Administered 2023-05-12 – 2023-05-20 (×32): 10 mg
  Filled 2023-05-12 (×31): qty 2

## 2023-05-12 MED ORDER — ARFORMOTEROL TARTRATE 15 MCG/2ML IN NEBU
15.0000 ug | INHALATION_SOLUTION | Freq: Two times a day (BID) | RESPIRATORY_TRACT | Status: DC
  Administered 2023-05-12 – 2023-05-23 (×23): 15 ug via RESPIRATORY_TRACT
  Filled 2023-05-12 (×23): qty 2

## 2023-05-12 MED ORDER — ORAL CARE MOUTH RINSE
15.0000 mL | OROMUCOSAL | Status: DC
  Administered 2023-05-12 – 2023-05-18 (×71): 15 mL via OROMUCOSAL

## 2023-05-12 MED ORDER — DOCUSATE SODIUM 50 MG/5ML PO LIQD
100.0000 mg | Freq: Two times a day (BID) | ORAL | Status: DC
Start: 2023-05-12 — End: 2023-05-22
  Administered 2023-05-12 – 2023-05-20 (×11): 100 mg
  Filled 2023-05-12 (×12): qty 10

## 2023-05-12 MED ORDER — SODIUM BICARBONATE 8.4 % IV SOLN
100.0000 meq | Freq: Once | INTRAVENOUS | Status: AC
  Administered 2023-05-12: 100 meq via INTRAVENOUS
  Filled 2023-05-12: qty 50

## 2023-05-12 MED ORDER — OSELTAMIVIR PHOSPHATE 75 MG PO CAPS
75.0000 mg | ORAL_CAPSULE | Freq: Two times a day (BID) | ORAL | Status: DC
  Administered 2023-05-12 – 2023-05-13 (×2): 75 mg
  Filled 2023-05-12 (×3): qty 1

## 2023-05-12 MED ORDER — NOREPINEPHRINE 4 MG/250ML-% IV SOLN
0.0000 ug/min | INTRAVENOUS | Status: DC
  Administered 2023-05-12: 2 ug/min via INTRAVENOUS
  Administered 2023-05-13: 5 ug/min via INTRAVENOUS
  Administered 2023-05-14: 2 ug/min via INTRAVENOUS
  Filled 2023-05-12 (×3): qty 250

## 2023-05-12 MED ORDER — MIDAZOLAM HCL 2 MG/2ML IJ SOLN
INTRAMUSCULAR | Status: AC
  Filled 2023-05-12: qty 4

## 2023-05-12 MED ORDER — FENTANYL 2500MCG IN NS 250ML (10MCG/ML) PREMIX INFUSION
50.0000 ug/h | INTRAVENOUS | Status: DC
  Administered 2023-05-13: 100 ug/h via INTRAVENOUS
  Administered 2023-05-14: 200 ug/h via INTRAVENOUS
  Administered 2023-05-14: 150 ug/h via INTRAVENOUS
  Filled 2023-05-12 (×3): qty 250

## 2023-05-12 MED ORDER — FENTANYL CITRATE PF 50 MCG/ML IJ SOSY
100.0000 ug | PREFILLED_SYRINGE | Freq: Once | INTRAMUSCULAR | Status: AC
  Administered 2023-05-12: 100 ug via INTRAVENOUS

## 2023-05-12 MED ORDER — DEXTROSE 10 % IV SOLN: INTRAVENOUS | Status: DC

## 2023-05-12 MED ORDER — HEPARIN SODIUM (PORCINE) 5000 UNIT/ML IJ SOLN
5000.0000 [IU] | Freq: Three times a day (TID) | INTRAMUSCULAR | Status: DC
  Administered 2023-05-12 – 2023-05-14 (×7): 5000 [IU] via SUBCUTANEOUS
  Filled 2023-05-12 (×7): qty 1

## 2023-05-12 MED ORDER — ORAL CARE MOUTH RINSE: 15.0000 mL | OROMUCOSAL | Status: DC | PRN

## 2023-05-12 MED ORDER — VANCOMYCIN VARIABLE DOSE PER UNSTABLE RENAL FUNCTION (PHARMACIST DOSING): Status: DC

## 2023-05-12 MED ORDER — CHLORHEXIDINE GLUCONATE CLOTH 2 % EX PADS
6.0000 | MEDICATED_PAD | Freq: Every day | CUTANEOUS | Status: DC
  Administered 2023-05-12 – 2023-05-23 (×14): 6 via TOPICAL

## 2023-05-12 MED ORDER — MIDAZOLAM HCL 2 MG/2ML IJ SOLN
4.0000 mg | Freq: Once | INTRAMUSCULAR | Status: AC
  Administered 2023-05-12: 4 mg via INTRAVENOUS
  Filled 2023-05-12: qty 4

## 2023-05-12 MED ORDER — INSULIN GLARGINE-YFGN 100 UNIT/ML ~~LOC~~ SOLN
25.0000 [IU] | Freq: Every day | SUBCUTANEOUS | Status: DC
  Administered 2023-05-12 – 2023-05-13 (×2): 25 [IU] via SUBCUTANEOUS
  Filled 2023-05-12 (×3): qty 0.25

## 2023-05-12 MED ORDER — INSULIN ASPART 100 UNIT/ML IJ SOLN
0.0000 [IU] | INTRAMUSCULAR | Status: DC
  Administered 2023-05-12: 20 [IU] via SUBCUTANEOUS
  Administered 2023-05-12: 16 [IU] via SUBCUTANEOUS
  Administered 2023-05-13 (×2): 8 [IU] via SUBCUTANEOUS
  Administered 2023-05-13: 4 [IU] via SUBCUTANEOUS
  Administered 2023-05-13: 12 [IU] via SUBCUTANEOUS
  Administered 2023-05-13 – 2023-05-14 (×3): 4 [IU] via SUBCUTANEOUS
  Administered 2023-05-14: 2 [IU] via SUBCUTANEOUS
  Administered 2023-05-14: 4 [IU] via SUBCUTANEOUS
  Administered 2023-05-14: 7 [IU] via SUBCUTANEOUS
  Administered 2023-05-15: 12 [IU] via SUBCUTANEOUS
  Administered 2023-05-15: 16 [IU] via SUBCUTANEOUS
  Administered 2023-05-15 (×2): 12 [IU] via SUBCUTANEOUS
  Administered 2023-05-15: 8 [IU] via SUBCUTANEOUS
  Administered 2023-05-15: 4 [IU] via SUBCUTANEOUS
  Administered 2023-05-15: 12 [IU] via SUBCUTANEOUS
  Administered 2023-05-16: 4 [IU] via SUBCUTANEOUS
  Administered 2023-05-16: 8 [IU] via SUBCUTANEOUS
  Administered 2023-05-16: 2 [IU] via SUBCUTANEOUS
  Administered 2023-05-16 (×2): 8 [IU] via SUBCUTANEOUS
  Administered 2023-05-16: 12 [IU] via SUBCUTANEOUS
  Administered 2023-05-17 (×2): 8 [IU] via SUBCUTANEOUS
  Administered 2023-05-17: 2 [IU] via SUBCUTANEOUS
  Administered 2023-05-17: 4 [IU] via SUBCUTANEOUS
  Administered 2023-05-17 – 2023-05-18 (×2): 2 [IU] via SUBCUTANEOUS
  Administered 2023-05-18 (×3): 4 [IU] via SUBCUTANEOUS
  Administered 2023-05-19: 8 [IU] via SUBCUTANEOUS
  Administered 2023-05-19: 4 [IU] via SUBCUTANEOUS
  Administered 2023-05-19 (×2): 8 [IU] via SUBCUTANEOUS
  Administered 2023-05-19: 4 [IU] via SUBCUTANEOUS
  Administered 2023-05-20 (×2): 2 [IU] via SUBCUTANEOUS
  Administered 2023-05-20 (×2): 4 [IU] via SUBCUTANEOUS
  Administered 2023-05-20: 2 [IU] via SUBCUTANEOUS
  Administered 2023-05-21: 8 [IU] via SUBCUTANEOUS
  Administered 2023-05-21: 2 [IU] via SUBCUTANEOUS

## 2023-05-12 MED ORDER — GABAPENTIN 250 MG/5ML PO SOLN
400.0000 mg | Freq: Three times a day (TID) | ORAL | Status: DC
  Administered 2023-05-12 – 2023-05-21 (×29): 400 mg
  Filled 2023-05-12 (×32): qty 8

## 2023-05-12 MED ORDER — FENTANYL CITRATE PF 50 MCG/ML IJ SOSY
50.0000 ug | PREFILLED_SYRINGE | Freq: Once | INTRAMUSCULAR | Status: DC
  Filled 2023-05-12: qty 1

## 2023-05-12 MED ORDER — INSULIN REGULAR(HUMAN) IN NACL 100-0.9 UT/100ML-% IV SOLN
INTRAVENOUS | Status: DC
  Administered 2023-05-12: 7.5 [IU]/h via INTRAVENOUS
  Filled 2023-05-12: qty 100

## 2023-05-12 MED ORDER — MIDAZOLAM-SODIUM CHLORIDE 100-0.9 MG/100ML-% IV SOLN
2.0000 mg/h | INTRAVENOUS | Status: DC
  Administered 2023-05-12: 2 mg/h via INTRAVENOUS
  Administered 2023-05-13 – 2023-05-14 (×2): 5 mg/h via INTRAVENOUS
  Administered 2023-05-14: 7 mg/h via INTRAVENOUS
  Filled 2023-05-12 (×5): qty 100

## 2023-05-12 MED ORDER — BUDESONIDE 0.5 MG/2ML IN SUSP
0.5000 mg | Freq: Two times a day (BID) | RESPIRATORY_TRACT | Status: DC
  Administered 2023-05-12 – 2023-05-23 (×22): 0.5 mg via RESPIRATORY_TRACT
  Filled 2023-05-12 (×22): qty 2

## 2023-05-12 MED ORDER — FENTANYL BOLUS VIA INFUSION
50.0000 ug | INTRAVENOUS | Status: DC | PRN
  Administered 2023-05-12 (×2): 50 ug via INTRAVENOUS
  Administered 2023-05-12: 100 ug via INTRAVENOUS
  Administered 2023-05-14: 50 ug via INTRAVENOUS
  Administered 2023-05-14: 100 ug via INTRAVENOUS
  Administered 2023-05-14: 50 ug via INTRAVENOUS
  Administered 2023-05-14: 100 ug via INTRAVENOUS
  Administered 2023-05-14: 50 ug via INTRAVENOUS
  Administered 2023-05-14: 100 ug via INTRAVENOUS
  Administered 2023-05-14: 50 ug via INTRAVENOUS
  Administered 2023-05-15 – 2023-05-17 (×11): 100 ug via INTRAVENOUS

## 2023-05-12 MED ORDER — VANCOMYCIN HCL 1500 MG/300ML IV SOLN
1500.0000 mg | Freq: Once | INTRAVENOUS | Status: AC
  Administered 2023-05-12: 1500 mg via INTRAVENOUS
  Filled 2023-05-12: qty 300

## 2023-05-12 MED ORDER — VANCOMYCIN HCL 1750 MG/350ML IV SOLN
1750.0000 mg | Freq: Once | INTRAVENOUS | Status: DC
  Filled 2023-05-12: qty 350

## 2023-05-12 MED ORDER — ROCURONIUM BROMIDE 10 MG/ML (PF) SYRINGE
PREFILLED_SYRINGE | INTRAVENOUS | Status: AC
  Filled 2023-05-12: qty 10

## 2023-05-12 MED ORDER — PROSOURCE TF20 ENFIT COMPATIBL EN LIQD
60.0000 mL | Freq: Every day | ENTERAL | Status: DC
  Administered 2023-05-12 – 2023-05-16 (×5): 60 mL
  Filled 2023-05-12 (×5): qty 60

## 2023-05-12 MED ORDER — SODIUM BICARBONATE 650 MG PO TABS
1300.0000 mg | ORAL_TABLET | Freq: Three times a day (TID) | ORAL | Status: DC
  Administered 2023-05-12 – 2023-05-16 (×15): 1300 mg
  Filled 2023-05-12 (×15): qty 2

## 2023-05-12 MED ORDER — ACETAMINOPHEN 650 MG RE SUPP
650.0000 mg | RECTAL | Status: DC | PRN
  Administered 2023-05-12 – 2023-05-13 (×4): 650 mg via RECTAL
  Filled 2023-05-12 (×4): qty 1

## 2023-05-12 MED ORDER — SODIUM BICARBONATE 8.4 % IV SOLN: 50.0000 meq | Freq: Once | INTRAVENOUS | Status: AC

## 2023-05-12 MED ORDER — FENTANYL CITRATE PF 50 MCG/ML IJ SOSY: 50.0000 ug | PREFILLED_SYRINGE | Freq: Once | INTRAMUSCULAR | Status: AC

## 2023-05-12 MED ORDER — FENTANYL BOLUS VIA INFUSION
50.0000 ug | INTRAVENOUS | Status: DC | PRN
  Administered 2023-05-13 (×2): 50 ug via INTRAVENOUS

## 2023-05-12 MED ORDER — METOPROLOL TARTRATE 25 MG PO TABS
25.0000 mg | ORAL_TABLET | Freq: Two times a day (BID) | ORAL | Status: DC
Start: 2023-05-12 — End: 2023-05-22
  Administered 2023-05-12 – 2023-05-21 (×17): 25 mg
  Filled 2023-05-12 (×18): qty 1

## 2023-05-12 MED ORDER — FUROSEMIDE 10 MG/ML IJ SOLN
20.0000 mg | Freq: Once | INTRAMUSCULAR | Status: AC
Start: 2023-05-12 — End: 2023-05-12
  Administered 2023-05-12: 20 mg via INTRAVENOUS
  Filled 2023-05-12: qty 2

## 2023-05-12 MED ORDER — VITAMIN B-12 100 MCG PO TABS
100.0000 ug | ORAL_TABLET | Freq: Every day | ORAL | Status: DC
  Administered 2023-05-12 – 2023-05-21 (×10): 100 ug
  Filled 2023-05-12 (×11): qty 1

## 2023-05-12 MED ORDER — MIDAZOLAM-SODIUM CHLORIDE 100-0.9 MG/100ML-% IV SOLN
INTRAVENOUS | Status: AC
Start: 2023-05-12 — End: 2023-05-12
  Filled 2023-05-12: qty 100

## 2023-05-12 MED ORDER — IPRATROPIUM-ALBUTEROL 0.5-2.5 (3) MG/3ML IN SOLN
3.0000 mL | RESPIRATORY_TRACT | Status: DC
  Administered 2023-05-12 (×3): 3 mL via RESPIRATORY_TRACT
  Filled 2023-05-12 (×3): qty 3

## 2023-05-12 MED ORDER — CISATRACURIUM BESYLATE (PF) 200 MG/20ML IV SOLN
0.0000 ug/kg/min | INTRAVENOUS | Status: DC
  Administered 2023-05-12: 3 ug/kg/min via INTRAVENOUS
  Filled 2023-05-12 (×2): qty 20

## 2023-05-12 NOTE — Procedures (Signed)
 Intubation Procedure Note  POET HINEMAN  [id number]  January 15, 1981  Date:05/12/23  Time:8:58 AM   Provider Performing:Lajune Perine P [facility]    Procedure: Intubation (31500)  Indication(s) Respiratory Failure  Consent Risks of the procedure as well as the alternatives and risks of each were explained to the patient and/or caregiver.  Consent for the procedure was obtained and is signed in the bedside chart   Anesthesia Versed, Fentanyl, and Rocuronium & bicarb   Time Out Verified patient identification, verified procedure, site/side was marked, verified correct patient position, special equipment/implants available, medications/allergies/relevant history reviewed, required imaging and test results available.   Sterile Technique Usual hand hygeine, masks, and gloves were used   Procedure Description Patient positioned in bed supine.  Sedation given as noted above.  Patient was intubated with endotracheal tube using Glidescope.  View was Grade 1 full glottis .  Number of attempts was 1.  Colorimetric CO2 detector was consistent with tracheal placement.   Complications/Tolerance Desaturation into 70s , recovered quickly with BVM. Chest X-ray is ordered to verify placement.   EBL 0   Specimen(s) None  Steffanie Dunn, DO 05/12/23 9:15 AM [facility]  For contact information, see Amion. If no response to pager, please call PCCM consult pager. After hours, 7PM- 7AM, please call Elink.

## 2023-05-12 NOTE — Progress Notes (Signed)
 Afternoon rounds:  ABG reviewed. Vent adjusted for permissive hypercapnia- no change in minute ventilation. FiO2 40%, PEEP 12.   Daughter updated at bedside.  Stable on sedation, nimbex, very low dose NE. Fine with MAP 65-80 due to her lability.  Steffanie Dunn, DO 05/12/23 5:12 PM [facility]  For contact information, see Amion. If no response to pager, please call PCCM consult pager. After hours, 7PM- 7AM, please call Elink.

## 2023-05-12 NOTE — Procedures (Signed)
 Central Venous Catheter Insertion Procedure Note  Melinda Stone  [id number]  Jul 17, 1980  Date:05/12/23  Time:11:48 AM   Provider Performing:Earlie Arciga Demetrius Charity [facility]   Procedure: Insertion of Non-tunneled Central Venous [phone]) with US guidance ([id number])   Indication(s) Medication administration  Consent Risks of the procedure as well as the alternatives and risks of each were explained to the patient and/or caregiver.  Consent for the procedure was obtained and is signed in the bedside chart  Anesthesia Topical only with 1% lidocaine   Timeout Verified patient identification, verified procedure, site/side was marked, verified correct patient position, special equipment/implants available, medications/allergies/relevant history reviewed, required imaging and test results available.  Sterile Technique Maximal sterile technique including full sterile barrier drape, hand hygiene, sterile gown, sterile gloves, mask, hair covering, sterile ultrasound probe cover (if used).  Procedure Description Area of catheter insertion was cleaned with chlorhexidine and draped in sterile fashion.  With real-time ultrasound guidance a central venous catheter was placed into the left internal jugular vein. Nonpulsatile blood flow and easy flushing noted in all ports. Guidewire confirmed in compressible vein prior to dilation. The catheter was sutured in place and sterile dressing applied.    Complications/Tolerance None; patient tolerated the procedure well. Chest X-ray is ordered to verify placement for internal jugular or subclavian cannulation.   Chest x-ray is not ordered for femoral cannulation.  EBL Minimal  Specimen(s) None  Steffanie Dunn, DO 05/12/23 11:49 AM [facility]

## 2023-05-12 NOTE — Progress Notes (Signed)
 NAME:  Melinda Stone, MRN:  [id number], DOB:  [birth year], LOS: 1 ADMISSION DATE:  05/11/2023 History of Present Illness:  This is a case of a 43 year old female patient with a past medical history of mild intermittent asthma, GERD, iron deficiency anemia follows with hematology with a baseline hemoglobin of 8.0 mg/dL presenting to [facility] emergency department on 02/22 with complaint of nausea vomiting abdominal pain.  Found to be in acute hypoxic respiratory failure requiring high flow nasal cannula at 60% FiO2.  Pulmonary team has been consulted for help with further management.  She reports that she was exposed to her son who was found to have the flu and for the past couple of days she started having nausea vomiting abdominal pain with worsening shortness of breath which prompted her presentation to the emergency department.  She was initially on 2 L nasal cannula however this progressed to high flow nasal cannula at 60% FiO2.  Labs in the ED were pertinent for anemia with hemoglobin of 6.9 g/dL from a baseline of 8 g/dL, thrombocytopenia with platelets of 128, creatinine of 1.04 mg/dL from a baseline of 0.9.  Lactic acid 0.8.  Troponin initially 40 followed by 83.  Influenza A positive.  CTA chest ruled out pulmonary embolism but did show right predominant consolidative and nodular opacities.  She was started on ceftriaxone azithromycin and given 1 dose of p.o. furosemide.  Also for suspicion of asthma exacerbation she was given 1 dose of IV Solu-Medrol 40 mg x 1.  Patient was placed on BiPAP however remained tachypneic and therefore transferred to the ICU.  Repeat labs with hemoglobin 6.4 g/dL.  Repeat chest x-ray with worsening bilateral pulmonary opacities.  BNP at 1000.  Patient will be transferred to the ICU for monitoring nitro drip and further diuresis.   Significant Hospital Events: Including procedures, antibiotic start and stop dates in addition to other pertinent  events   05/12/2023 - Patient admitted to the ICU  Objective   Blood pressure (!) 152/80, pulse (!) 104, temperature (!) 101 F (38.3 C), temperature source Axillary, resp. rate (!) 27, height 5\' 9"  (1.753 m), weight 97.5 kg, last menstrual period 04/01/2023, SpO2 96%.    FiO2 (%):  [50 %-60 %] 60 % PEEP:  [8 cmH20] 8 cmH20 Pressure Support:  [18 [id number] cmH20] 18 cmH20   Intake/Output Summary (Last 24 hours) at 05/12/2023 0149 Last data filed at 05/12/2023 0000 Gross per 24 hour  Intake --  Output 700 ml  Net -700 ml   Filed Weights   05/11/23 0545  Weight: 97.5 kg    Examination: Febrile to 101.1.  Hypertensive with SBP in the 150s. General: Awake and following commands. In mild to moderate respiratory distress.  HENT: Supple neck, reactive pupils Lungs: Diffuse crackles appreciated with faint expiratory wheezing. Cardiovascular: Tachycardic normal S1, normal S2 regular rhythm Abdomen: Soft nontender nondistended Extremities: Warm, trace lower extremity edema  Labs and imaging were reviewed.  Assessment & Plan:  Case of a 43 year old female patient with a past medical history of mild intermittent asthma, GERD, iron deficiency anemia, grade 1 diastolic dysfunction in 2023 with normal EF 60 to 65% presenting to [facility] with acute hypoxic respiratory failure secondary to influenza A pneumonia.  # Acute hypoxic respiratory failure secondary to... # Influenza A pneumonia with possible superimposed bacterial pneumonia and... # Pulmonary edema with worsening chest x-ray opacities, elevated proBNP at 1000 and trace lower extremity edema. # Suspicion for asthma exacerbation bilateral expiratory wheezing #  Acute on chronic anemia with history of iron deficiency anemia suspicious for ongoing hemolysis with elevated T. bili, elevated LDH. Smear negative for schistocytes.  # Thrombocytopenia with platelets at 113 #NAGMA Possibly from diarrhea vs RTA?   Neuro: On home amitriptyline  25 mg daily. CVS: X 1 IV furosemide 40 mg.  Nitro drip for SBP 1 20-1 40.  Echocardiogram in the AM. Pulmonary: MRSA swab, urine strep and Legionella, sputum culture.  Expand antibiotic coverage to Vanco cefepime and azithromycin can de-escalate in the a.m. depending on response. Duonebs Q3H scheduled and IV solumedrol 40mg  daily.  Renal: Monitor UOP , diuresis as above.  Heme: Haptoglobin pending, Fibrinogen pending, will send for cold agglutinin and DAPT based on the latter. Transfuse x1 PRBC.  Endo:  POC 140 - 160.   Tried reaching her family members for worsening respiratory status however unable to. Currently on bipap, I will reassess in 1hr for intubation. ABG appears well compensated metabolic acidosis.  Best Practice (right click and "Reselect all SmartList Selections" daily)   Diet/type: NPO w/ oral meds DVT prophylaxis SCD given acute on chronic anemia Pressure ulcer(s): N/A GI prophylaxis: N/A Lines: N/A Foley:  N/A Code Status:  full code  Review of Systems:   All systems were reviewed and are negative except for the above.    Critical care time: 60 minutes    Janann Colonel, MD [facility] 05/12/2023 2:11 AM

## 2023-05-12 NOTE — Progress Notes (Signed)
 NAME:  Melinda Stone, MRN:  [id number], DOB:  05-08-1980, LOS: 1 ADMISSION DATE:  05/11/2023 History of Present Illness:  This is a case of a 43 year old female patient with a past medical history of mild intermittent asthma, GERD, iron deficiency anemia follows with hematology with a baseline hemoglobin of 8.0 mg/dL presenting to [facility] emergency department on 02/22 with complaint of nausea vomiting abdominal pain.  Found to be in acute hypoxic respiratory failure requiring high flow nasal cannula at 60% FiO2.  Pulmonary team has been consulted for help with further management.  She reports that she was exposed to her son who was found to have the flu and for the past couple of days she started having nausea vomiting abdominal pain with worsening shortness of breath which prompted her presentation to the emergency department.  She was initially on 2 L nasal cannula however this progressed to high flow nasal cannula at 60% FiO2.  Labs in the ED were pertinent for anemia with hemoglobin of 6.9 g/dL from a baseline of 8 g/dL, thrombocytopenia with platelets of 128, creatinine of 1.04 mg/dL from a baseline of 0.9.  Lactic acid 0.8.  Troponin initially 40 followed by 83.  Influenza A positive.  CTA chest ruled out pulmonary embolism but did show right predominant consolidative and nodular opacities.  She was started on ceftriaxone azithromycin and given 1 dose of p.o. furosemide.  Also for suspicion of asthma exacerbation she was given 1 dose of IV Solu-Medrol 40 mg x 1.  Patient was placed on BiPAP however remained tachypneic and therefore transferred to the ICU.  Repeat labs with hemoglobin 6.4 g/dL.  Repeat chest x-ray with worsening bilateral pulmonary opacities.  BNP at 1000.  Patient will be transferred to the ICU for monitoring nitro drip and further diuresis.   Significant Hospital Events: Including procedures, antibiotic start and stop dates in addition to other pertinent  events   05/12/2023 - Patient admitted to the ICU  Interval events:  Desaturated to 85% as soon as she came off bipap for oral care.   Objective   Blood pressure 138/74, pulse (!) 107, temperature 98.5 F (36.9 C), resp. rate (!) 34, height 5\' 9"  (1.753 m), weight 97.5 kg, last menstrual period 04/01/2023, SpO2 99%.    Vent Mode: BIPAP FiO2 (%):  [50 %-80 %] 80 % Set Rate:  [15 bmp] 15 bmp PEEP:  [8 cmH20] 8 cmH20 Pressure Support:  [10 [id number] cmH20] 10 cmH20   Intake/Output Summary (Last 24 hours) at 05/12/2023 0820 Last data filed at 05/12/2023 0800 Gross per 24 hour  Intake 562.18 ml  Output 960 ml  Net -397.82 ml   Filed Weights   05/11/23 0545 05/12/23 0405  Weight: 97.5 kg 97.5 kg    Examination:  General: ill appearing woman lying in bed in NAD on bipap.  HENT: [place]/AT, eyes anicteric Lungs: large Vt on BiPAP near 900cc, mild tachypnea but appropriate work of breathing. Cardiovascular: S1S2, RRR Abdomen: obese, soft, NT Extremities: minimal ankle edema  CXR: worsening bilateral infiltrates, air bronchograms LLL, large gastric air bubble.   Labs reviewed  Assessment & Plan:   Acute hypoxic respiratory failure due to flu pneumonia, likely superimposed bacterial pneumonia ARDS Acute asthma exacerbation  -intubate today; expect this to be a prolonged course and not something that will turn around quickly based on her progression the las 12 hours.  Needs lung protective ventilation- current volumes on bipap are potentially injurious. -anticipate she will need to be  proned -A-line, CVC -con't tripled inhaled therapy for asthma -will try to avoid systemic steroids if reasonable with flu; need to see her vent mechanics to make this decision -broad antibiotics- ceftriaxone, vanc (MRSA nares pending), azithro (checking Qtc this morning) -VAP prevention protocol -PAD protocol  Sepsis due to pneumonia -check blood cultures before invasive lines -broad  antibiotics -keep MAP >65  AKI due to sepsis NAGMA likely from renal failure & diarrhea -strict I/O -renally dose meds, avoid nephrotoxic meds -bicarb push, repeat BMP now  Acute on chronic anemia with history of iron deficiency anemia suspicious for ongoing hemolysis with elevated T. bili, elevated LDH. Smear negative for schistocytes.  Thrombocytopenia, likely due to sepsis -DIC panel -transfuse for Hb <7 or hemodynamically significant bleeding -received 1 units pRBC  Hyperbilirubinemia likely due to sepsis vs hemolysis -checking DIC panel -supportive care -RUQ [place] to r/o gallstones  At risk for malnutrition -start TF today after intubation  H/o migraines -PAD protocol for pain  H/o anxiety -con't PTA gabapentin, amatryptline  Hyperglycemia -due to limited IV access until blood cultured drawn, will transition to basal bolus since she was not in DKA -start semglee 25 units daily now -insulin aspart 5 units once now -adding SSI PRN  Best Practice (right click and "Reselect all SmartList Selections" daily)   Diet/type: NPO w/ oral meds DVT prophylaxis prophylactic heparin  Pressure ulcer(s): N/A GI prophylaxis: PPI Lines: N/A Foley:  N/A Code Status:  full code    Critical care time:      This patient is critically ill with multiple organ system failure which requires frequent high complexity decision making, assessment, support, evaluation, and titration of therapies. This was completed through the application of advanced monitoring technologies and extensive interpretation of multiple databases. During this encounter critical care time was devoted to patient care services described in this note for 45 minutes.  Steffanie Dunn, DO 05/12/23 9:35 AM [facility]  For contact information, see Amion. If no response to pager, please call PCCM consult pager. After hours, 7PM- 7AM, please call Elink.

## 2023-05-12 NOTE — Progress Notes (Signed)
 eLink Physician-Brief Progress Note Patient Name: ALMEDA EZRA DOB: December 24, 1980 MRN: [id number]   Date of Service  05/12/2023  HPI/Events of Note  43 year old female patient with a past medical history of mild intermittent asthma, GERD, iron deficiency anemia, grade 1 diastolic dysfunction in 2023 with normal EF 60 to 65% presenting to [facility] with acute hypoxic respiratory failure secondary to influenza A pneumonia.  Requiring Bipap with 80% FIO2.  Other problems include hgb 7.1 and creat 1.40.  On empiric abx and tamiflu.  eICU Interventions  Chart reviewed     Intervention Category Evaluation Type: New Patient Evaluation  Henry Russel, P 05/12/2023, 4:00 AM

## 2023-05-12 NOTE — Plan of Care (Signed)
   Problem: Coping: Goal: Ability to adjust to condition or change in health will improve Outcome: Progressing   Problem: Fluid Volume: Goal: Ability to maintain a balanced intake and output will improve Outcome: Progressing   Problem: Health Behavior/Discharge Planning: Goal: Ability to identify and utilize available resources and services will improve Outcome: Progressing

## 2023-05-12 NOTE — Progress Notes (Signed)
 Discussed progression of respiratory failure rapidly with patient-- concern this will progress and it would be less safe to intubate once she has maxed out bipap, vs now we can still adequately pre-oxygenate. We discussed expected course being days to weeks for recovery. She consented for CVC & Aline for ARDS management, expecting she will likely need pressors for sedation. RN present for discussion. Confirmed 5'9", daughter Rosezena Sensor is emergency contact & NOK, who is aware she is admitted. I attempted to call her daughter to give an update- no answer, L/M.   Steffanie Dunn, DO 05/12/23 8:18 AM [facility]  For contact information, see Amion. If no response to pager, please call PCCM consult pager. After hours, 7PM- 7AM, please call Elink.

## 2023-05-12 NOTE — Procedures (Signed)
 Arterial Catheter Insertion Procedure Note  REIZEL CALZADA  [id number]  01/21/1981  Date:05/12/23  Time:11:46 AM    Provider Performing: Steffanie Dunn    Procedure: Insertion of Arterial Line (40981) with US guidance (19147)   Indication(s) Blood pressure monitoring and/or need for frequent ABGs  Consent Risks of the procedure as well as the alternatives and risks of each were explained to the patient and/or caregiver.  Consent for the procedure was obtained and is signed in the bedside chart  Anesthesia 1% lidocaine [place]    Time Out Verified patient identification, verified procedure, site/side was marked, verified correct patient position, special equipment/implants available, medications/allergies/relevant history reviewed, required imaging and test results available.   Sterile Technique Maximal sterile technique including full sterile barrier drape, hand hygiene, sterile gown, sterile gloves, mask, hair covering, sterile ultrasound probe cover (if used).   Procedure Description Area of catheter insertion was cleaned with chlorhexidine and draped in sterile fashion. With real-time ultrasound guidance an arterial catheter was placed into the right brachial artery.  Appropriate arterial tracings confirmed on monitor.     Complications/Tolerance None; patient tolerated the procedure well.   EBL Minimal   Specimen(s) None  Steffanie Dunn, DO 05/12/23 11:48 AM [facility]  For contact information, see Amion. If no response to pager, please call PCCM consult pager. After hours, 7PM- 7AM, please call Elink.

## 2023-05-12 NOTE — Progress Notes (Addendum)
 PHARMACY ANTIBIOTIC CONSULT NOTE   Melinda Stone a 43 y.o. female admitted with fevers, chills, malaise, diffuse bodyaches, n/v and headache. Patient subsequently developed respiratory distress with high O2 requirement. Patient is positive for influenza A.  Pharmacy has been consulted for vancomycin dosing.  CXR 2/23: findings consistent with PNA PCT 0.26 >1.3, MRSA PCR negative, blood cultures drawn   Scr 1.20 >1.55 (BL ~0.7), WBC 8.4 >7.2 Vital Signs: Tm 101.3, HR elevated, BP elevated, MV 2/23   Estimated Creatinine Clearance: 59.3 mL/min (A) (by C-G formula based on SCr of 1.55 mg/dL (H)).  Plan: Continue tamiflu 75 mg BID  Continue CTX 2000 mg daily Start Vancomycin 1500 mg once, subsequent dosing per unstable renal function- Vancomycin random in AM  STOP cefepime Continue azithromycin  Monitor renal function, clinical status, de-escalation, C/S, levels as indicated    Allergies:  Allergies  Allergen Reactions   Trulicity [Dulaglutide] Diarrhea    Filed Weights   05/11/23 0545 05/12/23 0405  Weight: 97.5 kg (215 lb) 97.5 kg (214 lb 15.2 oz)       Latest Ref Rng & Units 05/12/2023   10:17 AM 05/12/2023   10:04 AM 05/12/2023    3:53 AM  CBC  Hemoglobin 12.0 - 15.0 g/dL 7.1  8.2  7.1   Hematocrit 36.0 - 46.0 % 21.0  24.0  21.0     Antibiotics Given (last 72 hours)     Date/Time Action Medication Dose Rate   05/11/23 1500 New Bag/Given   cefTRIAXone (ROCEPHIN) 1 g in sodium chloride 0.9 % 100 mL IVPB 1 g 200 mL/hr   05/11/23 1548 New Bag/Given   azithromycin (ZITHROMAX) 500 mg in sodium chloride 0.9 % 250 mL IVPB 500 mg 250 mL/hr   05/11/23 2157 New Bag/Given   ceFEPIme (MAXIPIME) 2 g in sodium chloride 0.9 % 100 mL IVPB 2 g 200 mL/hr   05/12/23 1610 New Bag/Given   ceFEPIme (MAXIPIME) 2 g in sodium chloride 0.9 % 100 mL IVPB 2 g 200 mL/hr   05/12/23 9604 New Bag/Given   cefTRIAXone (ROCEPHIN) 2 g in sodium chloride 0.9 % 100 mL IVPB 2 g 200 mL/hr        Antimicrobials this admission: CTX 2/22 >> Azithro 2/22 >> Vancomycin 2/22>> Cefepime 2/22>> 2/23  Microbiology results: 2/22 MRSA PCR: negative 2/22 4-plex PCR: Flu A+  2/22 RVP: not detected  2/22 Bcx: pending  Thank you for allowing pharmacy to be a part of this patient's care.  Wilmer Floor, PharmD PGY2 Cardiology Pharmacy Resident 05/12/2023 11:53 AM

## 2023-05-13 ENCOUNTER — Inpatient Hospital Stay (HOSPITAL_COMMUNITY): Payer: Medicaid Other

## 2023-05-13 DIAGNOSIS — Z9911 Dependence on respirator [ventilator] status: Secondary | ICD-10-CM

## 2023-05-13 DIAGNOSIS — I509 Heart failure, unspecified: Secondary | ICD-10-CM | POA: Diagnosis not present

## 2023-05-13 DIAGNOSIS — J101 Influenza due to other identified influenza virus with other respiratory manifestations: Secondary | ICD-10-CM | POA: Diagnosis not present

## 2023-05-13 DIAGNOSIS — J189 Pneumonia, unspecified organism: Secondary | ICD-10-CM

## 2023-05-13 DIAGNOSIS — J80 Acute respiratory distress syndrome: Secondary | ICD-10-CM | POA: Diagnosis not present

## 2023-05-13 LAB — POCT I-STAT 7, (LYTES, BLD GAS, ICA,H+H)
Acid-base deficit: 7 mmol/L — ABNORMAL HIGH (ref 0.0–2.0)
Bicarbonate: 18.8 mmol/L — ABNORMAL LOW (ref 20.0–28.0)
Calcium, Ion: 1.09 mmol/L — ABNORMAL LOW (ref 1.15–1.40)
HCT: 19 % — ABNORMAL LOW (ref 36.0–46.0)
Hemoglobin: 6.5 g/dL — CL (ref 12.0–15.0)
O2 Saturation: 99 %
Patient temperature: 99.1
Potassium: 4.6 mmol/L (ref 3.5–5.1)
Sodium: 140 mmol/L (ref 135–145)
TCO2: 20 mmol/L — ABNORMAL LOW (ref 22–32)
pCO2 arterial: 35.9 mm[Hg] (ref 32–48)
pH, Arterial: 7.327 — ABNORMAL LOW (ref 7.35–7.45)
pO2, Arterial: 166 mm[Hg] — ABNORMAL HIGH (ref 83–108)

## 2023-05-13 LAB — ECHOCARDIOGRAM COMPLETE
AR max vel: 2.51 cm2
AV Area VTI: 2.68 cm2
AV Area mean vel: 2.45 cm2
AV Mean grad: 5 mm[Hg]
AV Peak grad: 9.6 mm[Hg]
Ao pk vel: 1.55 m/s
Area-P 1/2: 3.76 cm2
Height: 69 in
S' Lateral: 2.4 cm
Weight: 3389.79 [oz_av]

## 2023-05-13 LAB — BASIC METABOLIC PANEL
Anion gap: 10 (ref 5–15)
BUN: 51 mg/dL — ABNORMAL HIGH (ref 6–20)
CO2: 20 mmol/L — ABNORMAL LOW (ref 22–32)
Calcium: 7.4 mg/dL — ABNORMAL LOW (ref 8.9–10.3)
Chloride: 110 mmol/L (ref 98–111)
Creatinine, Ser: 3.02 mg/dL — ABNORMAL HIGH (ref 0.44–1.00)
GFR, Estimated: 19 mL/min — ABNORMAL LOW (ref >60)
Glucose, Bld: 209 mg/dL — ABNORMAL HIGH (ref 70–99)
Potassium: 4.6 mmol/L (ref 3.5–5.1)
Sodium: 140 mmol/L (ref 135–145)

## 2023-05-13 LAB — CBC
HCT: 21.9 % — ABNORMAL LOW (ref 36.0–46.0)
Hemoglobin: 7.4 g/dL — ABNORMAL LOW (ref 12.0–15.0)
MCH: 27.9 pg (ref 26.0–34.0)
MCHC: 33.8 g/dL (ref 30.0–36.0)
MCV: 82.6 fL (ref 80.0–100.0)
Platelets: 128 10*3/uL — ABNORMAL LOW (ref 150–400)
RBC: 2.65 MIL/uL — ABNORMAL LOW (ref 3.87–5.11)
RDW: 20.4 % — ABNORMAL HIGH (ref 11.5–15.5)
WBC: 8.8 10*3/uL (ref 4.0–10.5)
nRBC: 0 % (ref 0.0–0.2)

## 2023-05-13 LAB — PHOSPHORUS: Phosphorus: 6.6 mg/dL — ABNORMAL HIGH (ref 2.5–4.6)

## 2023-05-13 LAB — GLUCOSE, CAPILLARY
Glucose-Capillary: 286 mg/dL — ABNORMAL HIGH (ref 70–99)
Glucose-Capillary: 193 mg/dL — ABNORMAL HIGH (ref 70–99)
Glucose-Capillary: 214 mg/dL — ABNORMAL HIGH (ref 70–99)
Glucose-Capillary: 208 mg/dL — ABNORMAL HIGH (ref 70–99)
Glucose-Capillary: 171 mg/dL — ABNORMAL HIGH (ref 70–99)
Glucose-Capillary: 109 mg/dL — ABNORMAL HIGH (ref 70–99)

## 2023-05-13 LAB — MRSA NEXT GEN BY PCR, NASAL: MRSA by PCR Next Gen: NOT DETECTED

## 2023-05-13 LAB — VANCOMYCIN, RANDOM: Vancomycin Rm: 20 ug/mL

## 2023-05-13 LAB — MAGNESIUM: Magnesium: 2.2 mg/dL (ref 1.7–2.4)

## 2023-05-13 LAB — TRIGLYCERIDES: Triglycerides: 157 mg/dL — ABNORMAL HIGH (ref <150)

## 2023-05-13 MED ORDER — OSELTAMIVIR PHOSPHATE 30 MG PO CAPS
30.0000 mg | ORAL_CAPSULE | Freq: Every day | ORAL | Status: AC
  Administered 2023-05-14 – 2023-05-16 (×3): 30 mg
  Filled 2023-05-13 (×3): qty 1

## 2023-05-13 MED ORDER — INSULIN ASPART 100 UNIT/ML IJ SOLN
3.0000 [IU] | INTRAMUSCULAR | Status: DC
  Administered 2023-05-13 – 2023-05-15 (×13): 3 [IU] via SUBCUTANEOUS

## 2023-05-13 MED ORDER — THIAMINE MONONITRATE 100 MG PO TABS
100.0000 mg | ORAL_TABLET | Freq: Every day | ORAL | Status: AC
  Administered 2023-05-13 – 2023-05-17 (×5): 100 mg
  Filled 2023-05-13 (×5): qty 1

## 2023-05-13 MED ORDER — CISATRACURIUM BOLUS VIA INFUSION
0.1000 mg/kg | INTRAVENOUS | Status: DC | PRN
  Administered 2023-05-13 – 2023-05-15 (×10): 9.6 mg via INTRAVENOUS

## 2023-05-13 MED ORDER — INSULIN GLARGINE 100 UNIT/ML ~~LOC~~ SOLN
25.0000 [IU] | Freq: Every day | SUBCUTANEOUS | Status: DC
  Administered 2023-05-14: 25 [IU] via SUBCUTANEOUS
  Filled 2023-05-13 (×2): qty 0.25

## 2023-05-13 MED ORDER — VITAL 1.5 CAL PO LIQD
1000.0000 mL | ORAL | Status: DC
  Administered 2023-05-13 – 2023-05-16 (×4): 1000 mL
  Filled 2023-05-13: qty 1000

## 2023-05-13 NOTE — Progress Notes (Signed)
 NAME:  Melinda Stone, MRN:  [id number], DOB:  1980-08-23, LOS: 2 ADMISSION DATE:  05/11/2023 History of Present Illness:  This is a case of a 43 year old female patient with a past medical history of mild intermittent asthma, GERD, iron deficiency anemia follows with hematology with a baseline hemoglobin of 8.0 mg/dL presenting to [facility] emergency department on 02/22 with complaint of nausea vomiting abdominal pain.  Found to be in acute hypoxic respiratory failure requiring high flow nasal cannula at 60% FiO2.  Pulmonary team has been consulted for help with further management.  She reports that she was exposed to her son who was found to have the flu and for the past couple of days she started having nausea vomiting abdominal pain with worsening shortness of breath which prompted her presentation to the emergency department.  She was initially on 2 L nasal cannula however this progressed to high flow nasal cannula at 60% FiO2.  Labs in the ED were pertinent for anemia with hemoglobin of 6.9 g/dL from a baseline of 8 g/dL, thrombocytopenia with platelets of 128, creatinine of 1.04 mg/dL from a baseline of 0.9.  Lactic acid 0.8.  Troponin initially 40 followed by 83.  Influenza A positive.  CTA chest ruled out pulmonary embolism but did show right predominant consolidative and nodular opacities.  She was started on ceftriaxone azithromycin and given 1 dose of p.o. furosemide.  Also for suspicion of asthma exacerbation she was given 1 dose of IV Solu-Medrol 40 mg x 1.  Patient was placed on BiPAP however remained tachypneic and therefore transferred to the ICU.  Repeat labs with hemoglobin 6.4 g/dL.  Repeat chest x-ray with worsening bilateral pulmonary opacities.  BNP at 1000.  Patient will be transferred to the ICU for monitoring nitro drip and further diuresis.   Significant Hospital Events: Including procedures, antibiotic start and stop dates in addition to other pertinent  events   05/12/2023 - Patient admitted to the ICU, intubated, CVC and aline placed, paralyzed ARDS 2/24 on ARDS protocol, on nimbex, minimal levophed   Interval events:  Intubated, sedated, paralyzed   Objective   Blood pressure (!) 108/50, pulse 89, temperature 99.1 F (37.3 C), resp. rate (!) 26, height 5\' 9"  (1.753 m), weight 96.1 kg, last menstrual period 04/01/2023, SpO2 100%. CVP:  [6 mmHg-18 mmHg] 12 mmHg  Vent Mode: PRVC FiO2 (%):  [40 %-100 %] 40 % Set Rate:  [26 bmp] 26 bmp Vt Set:  [390 mL] 390 mL PEEP:  [12 cmH20-14 cmH20] 12 cmH20 Plateau Pressure:  [18 cmH20-28 cmH20] 25 cmH20   Intake/Output Summary (Last 24 hours) at 05/13/2023 0718 Last data filed at 05/13/2023 0700 Gross per 24 hour  Intake 2561.15 ml  Output 843 ml  Net 1718.15 ml   Filed Weights   05/11/23 0545 05/12/23 0405 05/13/23 0500  Weight: 97.5 kg 97.5 kg 96.1 kg    Examination: General: critically ill, adult female, paralyzed on vent  HEENT: normocephalic, ETT, OG, pink MM, pupils pin point non reactive  -on nimbex  Pulm: clear upper lung fields, diminished lower bases, no distress, synchronous, FIo2 40%, PEEP 12  CV: s1,s2, RRR, no JVD, no MRG  Abs: BS active, soft  Extremities: non pitting edema, no deformities  Neuro: RASS -5, paralyzed, BIS ~40, no movement to painful stimuli  Synchronous on vent     Assessment & Plan:   Acute hypoxic respiratory failure due to flu pneumonia, likely superimposed bacterial pneumonia ARDS Acute asthma exacerbation  Intubated on 2/23  BC NGTD P: Continue triple BD therapy Continue to hold off on steroids this time in setting of flu  Obtain MRSA- hope to dc vanc, continue for now with vanc, ceftriazone, azithromycin  Continue ventilator support and lung protective strategies  Continue LTVV 6cc/kg  Wean PEEP and Fio2 requirements to sat goal of >92%  HOB > 30 degrees Plat pressures < 30  Obtain ABG now, f/u  Obtain and follow cultures-blood and  tracheal aspirate  Sepsis due to pneumonia On low dose levophed, suspect this is more sedation  Due to shock state  P: Continue to follow BC  Follow up with MRSA swab Continue antibiotics as above Continue MAP goal >65   AKI due to sepsis NAGMA likely from renal failure & diarrhea Making adequate urine  Cr increase from 3.02 from 1.55  P: Continue to trend renal function daily  Continue to monitor and optimize electrolytes daily Continue to monitor urine output Continue strict I/Os Continue Adequate renal perfusion, MAP goal >65  Avoid nephrotoxic agent  Acute on chronic anemia  history of iron deficiency anemia-suspicious for ongoing hemolysis with elevated T. bili, elevated LDH. Smear negative for schistocytes.  Thrombocytopenia, likely due to sepsis DIC panel negative  Hgb 7.4 from 7.7, received 1 unit of PRBCs P: Continue to monitor and trend h/h on CBC Continue to transfuse for hgb <7  Hyperbilirubinemia  likely due to sepsis vs hemolysis DIC panel negative RUQ negative  P:  Continue to trend intermittently  Continue to supportive care   At risk for malnutrition P: Continue Tube feeds per OG  RD following appreciate assistance  H/o migraines P: Continue PAD protocol for pain management for now Continue amitriptyline  Continue supportive care   H/o anxiety P: Continue gabapentin, amitriptyline   Hyperglycemia P: Continue current SSI  Continue semglee 25 units daily  Add tube feed coverage  Continue to monitor CBGs q 4   Best Practice (right click and "Reselect all SmartList Selections" daily)   Diet/type: tube feeds  DVT prophylaxis prophylactic heparin  Pressure ulcer(s): N/A GI prophylaxis: PPI Lines: CVC, aline  Foley:  2/24  Code Status:  full code    Critical care time:  35 mins     Christian Dominiq Fontaine AGACNP-BC   [facility] 05/13/2023, 7:24 AM  Please see [email or web address] for pager details.  From 7A-7P if no  response, please call [phone]. After hours, please call ELink [phone].

## 2023-05-13 NOTE — Progress Notes (Signed)
*  PRELIMINARY RESULTS* Echocardiogram 2D Echocardiogram has been performed.  Earlie Server Dona Walby 05/13/2023, 10:08 AM

## 2023-05-13 NOTE — Progress Notes (Signed)
 eLink Physician-Brief Progress Note Patient Name: Melinda Stone DOB: [birth year] MRN: [id number]   Date of Service  05/13/2023  HPI/Events of Note  Notified of that hgb was 6.5 this morning from ABG sample.   eICU Interventions  Recheck CBC tonight.     Intervention Category Minor Interventions: Other:  Larinda Buttery 05/13/2023, 9:57 PM  5:59 AM Hgb noted at 6.3.   Plan> Transfuse 1 unit pRBC.

## 2023-05-13 NOTE — Plan of Care (Signed)
  Problem: Metabolic: Goal: Ability to maintain appropriate glucose levels will improve Outcome: Progressing   Problem: Nutritional: Goal: Maintenance of adequate nutrition will improve Outcome: Progressing   Problem: Clinical Measurements: Goal: Diagnostic test results will improve Outcome: Progressing

## 2023-05-13 NOTE — Progress Notes (Signed)
 Initial Nutrition Assessment  DOCUMENTATION CODES:   Not applicable, but at risk for malnutrition w/ initiation of TFs   INTERVENTION:  Initiate tube feeding via OGT: -Vital1.5 at 55 ml/h (1320 ml per day) Initiate TF at 44mL/hr and increase by 64mL/hr Q8 hours until goal rate achieved -Prosource TF20 60 ml once daily  Provides 2060 kcal, 109 gm protein, 1008 ml free water daily   Continue MVI w/ minerals Monitor magnesium, potassium, and phosphorus for 3  days, MD to replete as needed Thiamine 100mg  x5 days  NUTRITION DIAGNOSIS:  Inadequate oral intake related to inability to eat as evidenced by NPO status.  GOAL:  Patient will meet greater than or equal to 90% of their needs  MONITOR:  Diet advancement, Vent status, Labs, TF tolerance  REASON FOR ASSESSMENT:  Consult Enteral/tube feeding initiation and management  ASSESSMENT:   Patient admitted with c/o nausea, vomiting, and abdominal pain. Found to be in acute hypoxic respiratory failure. Influenza A positive. PMH: asthma, T2DM, GERD, iron deficiency anemia.   2/22 admitted  2/23 txr ICU/intubated/ OGT placed/ TF initiated 2/24 ECHO  Of note, patient follows with hematology. Baseline Hgb 8.0 mg/dL. Intubated and sedated this morning during visit. Consented to CVC and A-line for ARDS management.    Patient is currently intubated on ventilator support MV: 10.1 L/min Temp (24hrs), Avg:100.1 F (37.8 C), Min:98.8 F (37.1 C), Max:102 F (38.9 C)  Propofol: 17.55 ml/hr   No family at bedside. Unable to obtain nutrition-related hx. Appears well-nourished and tolerating Vital HP @ 32mL/hr. CXR did show large gastric air bubble.   Tube feeding initiated 2/23 via protocol. No documented intake to review prior to intubation. Discussed modifying tube feed formula and regimen with attending via secure chat. Amicable to advancing.   Pt unable to endorse UBW, however chart review shows range 89-99kg in past three months. No  significant wt loss during that time. Noted with 7.5% wt gain in three months.    BM+, hypoactive bowel sounds present. Abdomen soft, non-distended.  Admit Weight: 97.5kg Current Weight: 96.1kg  Intake/Output Summary (Last 24 hours) at 05/13/2023 1241 Last data filed at 05/13/2023 1000 Gross per 24 hour  Intake 2327.74 ml  Output 753 ml  Net 1574.74 ml    Drains/Lines:  A-line CVC OGT (proximal stomach) UOP: x24 hours  Propofol dosage downtrending. Low dose levo in place. MAP stable Refeeding labs stable. Recommend to continue daily draws.   Meds: cyanocobalamin, gabapentin, SSI, semglee, MVI, pantoprazole, Miralax, sodium bicarbonate, IV ABX Drips: Nimbex Versed Levo @ 105mcg/min Fentanyl @ 15mcg/hr Propofol @ 17.94mL/hr  Labs: Na+ 140 (wdl) PHOS 6.6 (H) BUN 27>31>51 (H) Crt 1.40>1.55>3.02 (H) CBGs 194-394 x24 hours A1c 5.0 (04/2023) Hgb 7.7>7.4>6.5 (L)  NUTRITION - FOCUSED PHYSICAL EXAM:  Flowsheet Row Most Recent Value  Orbital Region Unable to assess  [ETT holder]  Upper Arm Region No depletion  Thoracic and Lumbar Region No depletion  Buccal Region Unable to assess  [ETT holder]  Temple Region No depletion  Clavicle Bone Region No depletion  Clavicle and Acromion Bone Region No depletion  Scapular Bone Region No depletion  Dorsal Hand No depletion  Patellar Region No depletion  Anterior Thigh Region No depletion  Posterior Calf Region No depletion  Edema (RD Assessment) None  Hair Reviewed  Eyes Unable to assess  Mouth Reviewed  Skin Reviewed  Nails Reviewed    Diet Order:   Diet Order  Diet NPO time specified  Diet effective now            EDUCATION NEEDS:   Not appropriate for education at this time  Skin:  Skin Assessment: Reviewed RN Assessment  Last BM:  2/24 - type 5  Height:  Ht Readings from Last 1 Encounters:  05/12/23 5\' 9"  (1.753 m)   Weight:  Wt Readings from Last 1 Encounters:  05/13/23 96.1 kg    Ideal Body Weight:  65.9 kg  BMI:  Body mass index is 31.29 kg/m.  Estimated Nutritional Needs:   Kcal:  1900-2100kcal  Protein:  95-110g  Fluid:  >1.9L/day  Myrtie Cruise MS, RD, LDN Registered Dietitian Clinical Nutrition RD Inpatient Contact Info in Amion

## 2023-05-14 ENCOUNTER — Inpatient Hospital Stay (HOSPITAL_COMMUNITY): Payer: Medicaid Other

## 2023-05-14 DIAGNOSIS — J80 Acute respiratory distress syndrome: Secondary | ICD-10-CM | POA: Diagnosis not present

## 2023-05-14 DIAGNOSIS — R739 Hyperglycemia, unspecified: Secondary | ICD-10-CM

## 2023-05-14 DIAGNOSIS — J189 Pneumonia, unspecified organism: Secondary | ICD-10-CM | POA: Diagnosis not present

## 2023-05-14 DIAGNOSIS — Z9911 Dependence on respirator [ventilator] status: Secondary | ICD-10-CM | POA: Diagnosis not present

## 2023-05-14 DIAGNOSIS — J101 Influenza due to other identified influenza virus with other respiratory manifestations: Secondary | ICD-10-CM | POA: Diagnosis not present

## 2023-05-14 LAB — GLUCOSE, CAPILLARY
Glucose-Capillary: 115 mg/dL — ABNORMAL HIGH (ref 70–99)
Glucose-Capillary: 146 mg/dL — ABNORMAL HIGH (ref 70–99)
Glucose-Capillary: 173 mg/dL — ABNORMAL HIGH (ref 70–99)
Glucose-Capillary: 180 mg/dL — ABNORMAL HIGH (ref 70–99)
Glucose-Capillary: 185 mg/dL — ABNORMAL HIGH (ref 70–99)
Glucose-Capillary: 199 mg/dL — ABNORMAL HIGH (ref 70–99)

## 2023-05-14 LAB — BASIC METABOLIC PANEL
Anion gap: 4 — ABNORMAL LOW (ref 5–15)
BUN: 52 mg/dL — ABNORMAL HIGH (ref 6–20)
CO2: 19 mmol/L — ABNORMAL LOW (ref 22–32)
Calcium: 7.3 mg/dL — ABNORMAL LOW (ref 8.9–10.3)
Chloride: 116 mmol/L — ABNORMAL HIGH (ref 98–111)
Creatinine, Ser: 3.01 mg/dL — ABNORMAL HIGH (ref 0.44–1.00)
GFR, Estimated: 19 mL/min — ABNORMAL LOW (ref >60)
Glucose, Bld: 183 mg/dL — ABNORMAL HIGH (ref 70–99)
Potassium: 4.3 mmol/L (ref 3.5–5.1)
Sodium: 139 mmol/L (ref 135–145)

## 2023-05-14 LAB — POCT I-STAT 7, (LYTES, BLD GAS, ICA,H+H)
Acid-base deficit: 5 mmol/L — ABNORMAL HIGH (ref 0.0–2.0)
Acid-base deficit: 5 mmol/L — ABNORMAL HIGH (ref 0.0–2.0)
Bicarbonate: 20.4 mmol/L (ref 20.0–28.0)
Bicarbonate: 20.8 mmol/L (ref 20.0–28.0)
Calcium, Ion: 1.16 mmol/L (ref 1.15–1.40)
Calcium, Ion: 1.15 mmol/L (ref 1.15–1.40)
HCT: 15 % — ABNORMAL LOW (ref 36.0–46.0)
HCT: 18 % — ABNORMAL LOW (ref 36.0–46.0)
Hemoglobin: 5.1 g/dL — CL (ref 12.0–15.0)
Hemoglobin: 6.1 g/dL — CL (ref 12.0–15.0)
O2 Saturation: 99 %
O2 Saturation: 98 %
Patient temperature: 36.9
Patient temperature: 39.1
Potassium: 4.4 mmol/L (ref 3.5–5.1)
Potassium: 4.8 mmol/L (ref 3.5–5.1)
Sodium: 141 mmol/L (ref 135–145)
Sodium: 145 mmol/L (ref 135–145)
TCO2: 22 mmol/L (ref 22–32)
TCO2: 22 mmol/L (ref 22–32)
pCO2 arterial: 40.4 mm[Hg] (ref 32–48)
pCO2 arterial: 42.4 mm[Hg] (ref 32–48)
pH, Arterial: 7.311 — ABNORMAL LOW (ref 7.35–7.45)
pH, Arterial: 7.309 — ABNORMAL LOW (ref 7.35–7.45)
pO2, Arterial: 126 mm[Hg] — ABNORMAL HIGH (ref 83–108)
pO2, Arterial: 120 mm[Hg] — ABNORMAL HIGH (ref 83–108)

## 2023-05-14 LAB — CULTURE, RESPIRATORY W GRAM STAIN: Gram Stain: NONE SEEN

## 2023-05-14 LAB — CBC
HCT: 18.7 % — ABNORMAL LOW (ref 36.0–46.0)
HCT: 20.6 % — ABNORMAL LOW (ref 36.0–46.0)
Hemoglobin: 6.3 g/dL — CL (ref 12.0–15.0)
Hemoglobin: 6.8 g/dL — CL (ref 12.0–15.0)
MCH: 27.6 pg (ref 26.0–34.0)
MCH: 27.5 pg (ref 26.0–34.0)
MCHC: 33.7 g/dL (ref 30.0–36.0)
MCHC: 33 g/dL (ref 30.0–36.0)
MCV: 82 fL (ref 80.0–100.0)
MCV: 83.4 fL (ref 80.0–100.0)
Platelets: 98 10*3/uL — ABNORMAL LOW (ref 150–400)
Platelets: 99 10*3/uL — ABNORMAL LOW (ref 150–400)
RBC: 2.28 MIL/uL — ABNORMAL LOW (ref 3.87–5.11)
RBC: 2.47 MIL/uL — ABNORMAL LOW (ref 3.87–5.11)
RDW: 20.5 % — ABNORMAL HIGH (ref 11.5–15.5)
RDW: 19.4 % — ABNORMAL HIGH (ref 11.5–15.5)
WBC: 5.6 10*3/uL (ref 4.0–10.5)
WBC: 5.1 10*3/uL (ref 4.0–10.5)
nRBC: 0 % (ref 0.0–0.2)
nRBC: 0 % (ref 0.0–0.2)

## 2023-05-14 LAB — MAGNESIUM: Magnesium: 2.2 mg/dL (ref 1.7–2.4)

## 2023-05-14 LAB — HAPTOGLOBIN: Haptoglobin: <10 mg/dL — ABNORMAL LOW (ref 42–296)

## 2023-05-14 LAB — HEMOGLOBIN AND HEMATOCRIT, BLOOD
HCT: 23.3 % — ABNORMAL LOW (ref 36.0–46.0)
Hemoglobin: 7.8 g/dL — ABNORMAL LOW (ref 12.0–15.0)

## 2023-05-14 LAB — PREPARE RBC (CROSSMATCH)

## 2023-05-14 LAB — PHOSPHORUS: Phosphorus: 5.6 mg/dL — ABNORMAL HIGH (ref 2.5–4.6)

## 2023-05-14 MED ORDER — IPRATROPIUM-ALBUTEROL 0.5-2.5 (3) MG/3ML IN SOLN
RESPIRATORY_TRACT | Status: AC
  Filled 2023-05-14: qty 3

## 2023-05-14 MED ORDER — SODIUM CHLORIDE 0.9% IV SOLUTION: Freq: Once | INTRAVENOUS | Status: DC

## 2023-05-14 MED ORDER — ACETAMINOPHEN 160 MG/5ML PO SOLN
650.0000 mg | Freq: Four times a day (QID) | ORAL | Status: DC | PRN
  Administered 2023-05-14 – 2023-05-21 (×10): 650 mg
  Filled 2023-05-14 (×11): qty 20.3

## 2023-05-14 MED ORDER — HEPARIN SODIUM (PORCINE) 5000 UNIT/ML IJ SOLN
5000.0000 [IU] | Freq: Three times a day (TID) | INTRAMUSCULAR | Status: DC
  Administered 2023-05-14 – 2023-05-23 (×26): 5000 [IU] via SUBCUTANEOUS
  Filled 2023-05-14 (×26): qty 1

## 2023-05-14 MED ORDER — POLYVINYL ALCOHOL 1.4 % OP SOLN
1.0000 [drp] | OPHTHALMIC | Status: DC | PRN
  Administered 2023-05-14 – 2023-05-18 (×3): 1 [drp] via OPHTHALMIC
  Filled 2023-05-14: qty 15

## 2023-05-14 NOTE — Progress Notes (Signed)
 Per Silas Flood, Infection Prevention, Airborne precautions is not needed d/t Respiratory (20 pathogens) panel PCR negative.  Droplet precautions is appropriate at this time.

## 2023-05-14 NOTE — Progress Notes (Addendum)
 eLink Physician-Brief Progress Note Patient Name: Melinda Stone DOB: 1981-02-28 MRN: [id number]   Date of Service  05/14/2023  HPI/Events of Note  Hgb at 6.1 on ABG sample.   eICU Interventions  Check H&H.      Intervention Category Intermediate Interventions: Other:  Larinda Buttery 05/14/2023, 9:58 PM  11:27 PM Notified by RN of intermittent episodes of desaturation into the 80s with high peak pressures and vent asynchrony.  Pt was on VC and peak pressures were in the 50s. Pt was given a bolus of nimbex with improvement in sats upto 100%. Pt placed on PRVC.   Plan> Get CXR.  Increase sedation - maximize versed, continue fentanyl and propofol.  Can restart nimbex only if she desaturates and becomes asynchronous again.  2:16 AM CXR reviewed with no pneumothorax.  Pt has needed hourly pushes of nimbex.   Plan> Nimbex gtt reordered.   4:32 AM ABG 7.324/39.3/150.  Hgb now at 7.1   Plan> Repeat CBC at noon.

## 2023-05-14 NOTE — Progress Notes (Signed)
   05/14/23 2323  Adult Ventilator Settings  Vent Type Servo i  Humidity HME  Vent Mode PRVC  Vt Set 530 mL  Set Rate 18 bmp  FiO2 (%) 40 %  I Time 0.9 Sec(s)  PEEP 8 cmH20  Adult Ventilator Measurements  Peak Airway Pressure 41 L/min  Mean Airway Pressure 18 cmH20  Resp Rate Spontaneous 0 br/min  Resp Rate Total 18 br/min  Exhaled Vt 577 mL  Measured Ve 9.7 L  I:E Ratio Measured 1:2.2  Auto PEEP 0 cmH20  Total PEEP 8 cmH20  SpO2 91 %  Adult Ventilator Alarms  Alarms On Y  Ve High Alarm 21 L/min  Ve Low Alarm 4 L/min  Resp Rate High Alarm 38 br/min  Resp Rate Low Alarm 12  PEEP Low Alarm 6 cmH2O  Press High Alarm 55 cmH2O  T Apnea 20 sec(s)   Switched per md order pt peak pressure on VC reached 55

## 2023-05-14 NOTE — Plan of Care (Signed)
  Problem: Clinical Measurements: Goal: Ability to maintain clinical measurements within normal limits will improve Outcome: Progressing   Problem: Clinical Measurements: Goal: Diagnostic test results will improve Outcome: Progressing   Problem: Clinical Measurements: Goal: Respiratory complications will improve Outcome: Progressing   

## 2023-05-14 NOTE — Plan of Care (Signed)
  Problem: Clinical Measurements: Goal: Ability to maintain clinical measurements within normal limits will improve 05/14/2023 2021 by Leanne Lovely, RN Outcome: Progressing 05/14/2023 1954 by Leanne Lovely, RN Outcome: Progressing   Problem: Clinical Measurements: Goal: Diagnostic test results will improve 05/14/2023 2021 by Leanne Lovely, RN Outcome: Progressing 05/14/2023 1954 by Leanne Lovely, RN Outcome: Progressing   Problem: Clinical Measurements: Goal: Respiratory complications will improve 05/14/2023 2021 by Leanne Lovely, RN Outcome: Progressing 05/14/2023 1954 by Leanne Lovely, RN Outcome: Progressing

## 2023-05-14 NOTE — Progress Notes (Addendum)
 NAME:  Melinda Stone, MRN:  [id number], DOB:  [birth year], LOS: 3 ADMISSION DATE:  05/11/2023 History of Present Illness:  This is a case of a 43 year old female patient with a past medical history of mild intermittent asthma, GERD, iron deficiency anemia follows with hematology with a baseline hemoglobin of 8.0 mg/dL presenting to [facility] emergency department on 02/22 with complaint of nausea vomiting abdominal pain.  Found to be in acute hypoxic respiratory failure requiring high flow nasal cannula at 60% FiO2.  Pulmonary team has been consulted for help with further management.  She reports that she was exposed to her son who was found to have the flu and for the past couple of days she started having nausea vomiting abdominal pain with worsening shortness of breath which prompted her presentation to the emergency department.  She was initially on 2 L nasal cannula however this progressed to high flow nasal cannula at 60% FiO2.  Labs in the ED were pertinent for anemia with hemoglobin of 6.9 g/dL from a baseline of 8 g/dL, thrombocytopenia with platelets of 128, creatinine of 1.04 mg/dL from a baseline of 0.9.  Lactic acid 0.8.  Troponin initially 40 followed by 83.  Influenza A positive.  CTA chest ruled out pulmonary embolism but did show right predominant consolidative and nodular opacities.  She was started on ceftriaxone azithromycin and given 1 dose of p.o. furosemide.  Also for suspicion of asthma exacerbation she was given 1 dose of IV Solu-Medrol 40 mg x 1.  Patient was placed on BiPAP however remained tachypneic and therefore transferred to the ICU.  Repeat labs with hemoglobin 6.4 g/dL.  Repeat chest x-ray with worsening bilateral pulmonary opacities.  BNP at 1000.  Patient will be transferred to the ICU for monitoring nitro drip and further diuresis.   Significant Hospital Events: Including procedures, antibiotic start and stop dates in addition to other pertinent  events   05/12/2023 - Patient admitted to the ICU, intubated, CVC and aline placed, paralyzed ARDS 2/24 on ARDS protocol, on nimbex, minimal levophed  2/25 Receiving intermittent nimbex boluses but Fio2 40%, PEEP 8   Interval events:  Intubated, sedated, not paralyzed  Received intermittent boluses of nimbex overnight  Febrile previous shift    Objective   Blood pressure (!) 124/56, pulse 81, temperature 98.2 F (36.8 C), resp. rate 15, height 5\' 9"  (1.753 m), weight 97.4 kg, last menstrual period 04/01/2023, SpO2 100%. CVP:  [9 mmHg-23 mmHg] 13 mmHg  Vent Mode: PRVC FiO2 (%):  [40 %] 40 % Set Rate:  [26 bmp] 26 bmp Vt Set:  [390 mL] 390 mL PEEP:  [8 cmH20-10 cmH20] 8 cmH20 Plateau Pressure:  [11 cmH20-21 cmH20] 21 cmH20   Intake/Output Summary (Last 24 hours) at 05/14/2023 0857 Last data filed at 05/14/2023 0700 Gross per 24 hour  Intake 2533.24 ml  Output 1350 ml  Net 1183.24 ml   Filed Weights   05/12/23 0405 05/13/23 0500 05/14/23 0500  Weight: 97.5 kg 96.1 kg 97.4 kg    Examination: General: critically ill, adult female lying in ICU bed on vent HEENT: Normocephalic, ETT, OG, pink moist MM, teeth intact PERRLA intact sluggish ' Pulm: clear, upper lung fields, diminished lower bases, no distress  Synchronous, Fio2 40%, PEEP 8  CV: s1,s2, RRR, no JVD, No MRG Abs: BS soft, active Extremities: non pitting edema generalized, no deformities  Neuro: RASS -4, positive cough/gag reflex upon suctioning, PERRLA intact Pupils 2+, sluggish, BIS 50-60   Assessment &  Plan:   Acute hypoxic respiratory failure due to flu pneumonia, likely superimposed bacterial pneumonia ARDS Acute asthma exacerbation  Intubated on 2/23  BC NGTD MRSA pcr neg 2/24  ABG on 2/25 PF ratio 7.311 pCo2, 40.4, PO2 126, bicarb 20.4, 315  Continue triple BD therapy Vanc discontinued since MRSA neg, continue cetriazone and azithromycin  Continue tamiflu  Continue ventilator support and lung protective  strategies  Continue LTVV 6cc  Wean PEEP and Fio2 requirements to sat goal of >92%  HOB > 30 degrees Plat pressures < 30  Intermittent Chest X-ray and ABGS follow cultures-blood and tracheal aspirate Assess daily need of lasix- still on pressors, will hold off at this time. May need some in next few days depending on renal function.  VAP and PAD protocols in place-  Currently off nimbex, received some boluses overnight. Will attempt to stay off paralytic today with slow decrease in versed gtt to hopefully wean off today. Continue prop, fentanyl. Patient switched to volume control-patient synchronous, more comfortable.   Sepsis due to pneumonia On low dose levophed, suspect this is more sedation  Due to shock state  P: No BCTD, continue to follow BC  Continue to antibiotic coverage as anove Continue MAP goal >65, goal to wean off levophed today   AKI due to sepsis NAGMA likely from renal failure & diarrhea Making adequate urine  Cr  1.55>> 3.02>>3.01 Creatinine possibly leveling out, hopeful for trend downward within next 24hrs  P: Continue to trend renal function daily  Continue to monitor and optimize electrolytes daily Continue to monitor urine output Continue strict I/Os Continue Adequate renal perfusion, MAP goal >65  Avoid nephrotoxic agent  Acute on chronic anemia  history of iron deficiency anemia-suspicious for ongoing hemolysis with elevated T. bili, elevated LDH. Smear negative for schistocytes.  Thrombocytopenia, likely due to sepsis DIC panel negative  Hgb 6.3 on 2/25  P: Receiving 1 unit of PRBCs, obtain post CBC 2 hours after  Continue to monitor for signs of bleeding  Possibly will need iron infusion  Continue to transfuse for hgb <7  Obtain CBC daily  Hyperbilirubinemia  likely due to sepsis vs hemolysis DIC panel negative RUQ negative  P:  Continue to trend intermittently  Continue supportive care   At risk for malnutrition P: Continue tube feeds  per OG  RD following, appreciate assistance  H/o migraines P: Continue PAD protocol for pain management Continue amitriptyline per tube Continue supportive care  H/o anxiety P: Continue gabapentin Continue amitriptyline   Hyperglycemia P: Continue current SSI Continue semglee 25 units daily  Continue tube feed coverage  Continue to monitor CBG q4    Best Practice (right click and "Reselect all SmartList Selections" daily)   Diet/type: tube feeds  DVT prophylaxis prophylactic heparin  Pressure ulcer(s): N/A GI prophylaxis: PPI Lines: CVC, aline  Foley:  2/24  Code Status:  full code    Critical care time:  35 mins     Christian Kissie Ziolkowski AGACNP-BC   [facility] 05/14/2023, 8:57 AM  Please see [email or web address] for pager details.  From 7A-7P if no response, please call [phone]. After hours, please call ELink [phone].

## 2023-05-15 DIAGNOSIS — R739 Hyperglycemia, unspecified: Secondary | ICD-10-CM

## 2023-05-15 DIAGNOSIS — J80 Acute respiratory distress syndrome: Secondary | ICD-10-CM | POA: Diagnosis not present

## 2023-05-15 DIAGNOSIS — J101 Influenza due to other identified influenza virus with other respiratory manifestations: Secondary | ICD-10-CM | POA: Diagnosis not present

## 2023-05-15 DIAGNOSIS — Z9911 Dependence on respirator [ventilator] status: Secondary | ICD-10-CM | POA: Diagnosis not present

## 2023-05-15 DIAGNOSIS — J189 Pneumonia, unspecified organism: Secondary | ICD-10-CM | POA: Diagnosis not present

## 2023-05-15 LAB — CBC
HCT: 21.6 % — ABNORMAL LOW (ref 36.0–46.0)
HCT: 19.6 % — ABNORMAL LOW (ref 36.0–46.0)
HCT: 23.2 % — ABNORMAL LOW (ref 36.0–46.0)
Hemoglobin: 7.1 g/dL — ABNORMAL LOW (ref 12.0–15.0)
Hemoglobin: 6.5 g/dL — CL (ref 12.0–15.0)
Hemoglobin: 7.7 g/dL — ABNORMAL LOW (ref 12.0–15.0)
MCH: 27.6 pg (ref 26.0–34.0)
MCH: 27.9 pg (ref 26.0–34.0)
MCH: 27.9 pg (ref 26.0–34.0)
MCHC: 32.9 g/dL (ref 30.0–36.0)
MCHC: 33.2 g/dL (ref 30.0–36.0)
MCHC: 33.2 g/dL (ref 30.0–36.0)
MCV: 84 fL (ref 80.0–100.0)
MCV: 84.1 fL (ref 80.0–100.0)
MCV: 84.1 fL (ref 80.0–100.0)
Platelets: 95 10*3/uL — ABNORMAL LOW (ref 150–400)
Platelets: 88 10*3/uL — ABNORMAL LOW (ref 150–400)
Platelets: 96 10*3/uL — ABNORMAL LOW (ref 150–400)
RBC: 2.57 MIL/uL — ABNORMAL LOW (ref 3.87–5.11)
RBC: 2.33 MIL/uL — ABNORMAL LOW (ref 3.87–5.11)
RBC: 2.76 MIL/uL — ABNORMAL LOW (ref 3.87–5.11)
RDW: 19.3 % — ABNORMAL HIGH (ref 11.5–15.5)
RDW: 19.4 % — ABNORMAL HIGH (ref 11.5–15.5)
RDW: 18.6 % — ABNORMAL HIGH (ref 11.5–15.5)
WBC: 5.4 10*3/uL (ref 4.0–10.5)
WBC: 3.9 10*3/uL — ABNORMAL LOW (ref 4.0–10.5)
WBC: 4.3 10*3/uL (ref 4.0–10.5)
nRBC: 0 % (ref 0.0–0.2)
nRBC: 0 % (ref 0.0–0.2)
nRBC: 0 % (ref 0.0–0.2)

## 2023-05-15 LAB — POCT I-STAT 7, (LYTES, BLD GAS, ICA,H+H)
Acid-base deficit: 5 mmol/L — ABNORMAL HIGH (ref 0.0–2.0)
Bicarbonate: 20.4 mmol/L (ref 20.0–28.0)
Calcium, Ion: 1.19 mmol/L (ref 1.15–1.40)
HCT: 17 % — ABNORMAL LOW (ref 36.0–46.0)
Hemoglobin: 5.8 g/dL — CL (ref 12.0–15.0)
O2 Saturation: 99 %
Patient temperature: 37
Potassium: 4.8 mmol/L (ref 3.5–5.1)
Sodium: 143 mmol/L (ref 135–145)
TCO2: 22 mmol/L (ref 22–32)
pCO2 arterial: 39.3 mm[Hg] (ref 32–48)
pH, Arterial: 7.324 — ABNORMAL LOW (ref 7.35–7.45)
pO2, Arterial: 150 mm[Hg] — ABNORMAL HIGH (ref 83–108)

## 2023-05-15 LAB — BPAM RBC
Blood Product Expiration Date: 202503152359
Blood Product Expiration Date: 202503172359
ISSUE DATE / TIME: 202502230125
ISSUE DATE / TIME: 202502250853
ISSUE DATE / TIME: 202502251804
ISSUE DATE / TIME: 202503172359
Unit Type and Rh: 202503172359
Unit Type and Rh: 202503172359
Unit Type and Rh: 6200
Unit Type and Rh: 6200
Unit Type and Rh: 6200

## 2023-05-15 LAB — TYPE AND SCREEN
ABO/RH(D): A POS
Antibody Screen: NEGATIVE
Unit division: 0
Unit division: 0
Unit division: 0

## 2023-05-15 LAB — GLUCOSE, CAPILLARY
Glucose-Capillary: 191 mg/dL — ABNORMAL HIGH (ref 70–99)
Glucose-Capillary: 236 mg/dL — ABNORMAL HIGH (ref 70–99)
Glucose-Capillary: 268 mg/dL — ABNORMAL HIGH (ref 70–99)
Glucose-Capillary: 278 mg/dL — ABNORMAL HIGH (ref 70–99)
Glucose-Capillary: 286 mg/dL — ABNORMAL HIGH (ref 70–99)
Glucose-Capillary: 294 mg/dL — ABNORMAL HIGH (ref 70–99)
Glucose-Capillary: 330 mg/dL — ABNORMAL HIGH (ref 70–99)

## 2023-05-15 LAB — BASIC METABOLIC PANEL
Anion gap: 11 (ref 5–15)
BUN: 59 mg/dL — ABNORMAL HIGH (ref 6–20)
CO2: 21 mmol/L — ABNORMAL LOW (ref 22–32)
Calcium: 8 mg/dL — ABNORMAL LOW (ref 8.9–10.3)
Chloride: 113 mmol/L — ABNORMAL HIGH (ref 98–111)
Creatinine, Ser: 2.94 mg/dL — ABNORMAL HIGH (ref 0.44–1.00)
GFR, Estimated: 20 mL/min — ABNORMAL LOW (ref >60)
Glucose, Bld: 312 mg/dL — ABNORMAL HIGH (ref 70–99)
Potassium: 4.8 mmol/L (ref 3.5–5.1)
Sodium: 145 mmol/L (ref 135–145)

## 2023-05-15 LAB — PREPARE RBC (CROSSMATCH)

## 2023-05-15 LAB — MAGNESIUM: Magnesium: 2.3 mg/dL (ref 1.7–2.4)

## 2023-05-15 LAB — PHOSPHORUS: Phosphorus: 5.2 mg/dL — ABNORMAL HIGH (ref 2.5–4.6)

## 2023-05-15 MED ORDER — INSULIN GLARGINE 100 UNIT/ML ~~LOC~~ SOLN
30.0000 [IU] | Freq: Every day | SUBCUTANEOUS | Status: DC
  Administered 2023-05-15: 30 [IU] via SUBCUTANEOUS
  Filled 2023-05-15: qty 0.3

## 2023-05-15 MED ORDER — SODIUM CHLORIDE 0.9% IV SOLUTION: Freq: Once | INTRAVENOUS | Status: DC

## 2023-05-15 MED ORDER — CISATRACURIUM BESYLATE (PF) 200 MG/20ML IV SOLN
0.0000 ug/kg/min | INTRAVENOUS | Status: DC
  Administered 2023-05-15: 3 ug/kg/min via INTRAVENOUS
  Filled 2023-05-15: qty 20

## 2023-05-15 MED ORDER — ARTIFICIAL TEARS OPHTHALMIC OINT
1.0000 | TOPICAL_OINTMENT | Freq: Three times a day (TID) | OPHTHALMIC | Status: DC
Start: 2023-05-15 — End: 2023-05-18
  Administered 2023-05-15 (×2): 1 via OPHTHALMIC

## 2023-05-15 MED ORDER — SODIUM CHLORIDE 0.9 % IV SOLN
0.5000 ug/kg/min | INTRAVENOUS | Status: DC
  Filled 2023-05-15: qty 20

## 2023-05-15 MED ORDER — INSULIN GLARGINE 100 UNIT/ML ~~LOC~~ SOLN
30.0000 [IU] | Freq: Two times a day (BID) | SUBCUTANEOUS | Status: DC
  Administered 2023-05-15 – 2023-05-16 (×3): 30 [IU] via SUBCUTANEOUS
  Filled 2023-05-15 (×5): qty 0.3

## 2023-05-15 MED ORDER — FUROSEMIDE 10 MG/ML IJ SOLN
80.0000 mg | Freq: Once | INTRAMUSCULAR | Status: AC
  Administered 2023-05-15: 80 mg via INTRAVENOUS
  Filled 2023-05-15: qty 8

## 2023-05-15 MED ORDER — INSULIN ASPART 100 UNIT/ML IJ SOLN: 5.0000 [IU] | INTRAMUSCULAR | Status: DC

## 2023-05-15 MED ORDER — INSULIN ASPART 100 UNIT/ML IJ SOLN
5.0000 [IU] | INTRAMUSCULAR | Status: DC
  Administered 2023-05-15 – 2023-05-16 (×6): 5 [IU] via SUBCUTANEOUS

## 2023-05-15 MED ORDER — PROPOFOL 1000 MG/100ML IV EMUL
25.0000 ug/kg/min | INTRAVENOUS | Status: DC
  Administered 2023-05-15: 30 ug/kg/min via INTRAVENOUS
  Administered 2023-05-15: 40 ug/kg/min via INTRAVENOUS
  Administered 2023-05-15: 25 ug/kg/min via INTRAVENOUS
  Administered 2023-05-15: 30 ug/kg/min via INTRAVENOUS
  Administered 2023-05-16: 10 ug/kg/min via INTRAVENOUS
  Filled 2023-05-15 (×2): qty 100
  Filled 2023-05-15 (×2): qty 200

## 2023-05-15 MED ORDER — FENTANYL 2500MCG IN NS 250ML (10MCG/ML) PREMIX INFUSION
0.0000 ug/h | INTRAVENOUS | Status: DC
  Administered 2023-05-15: 275 ug/h via INTRAVENOUS
  Administered 2023-05-15: 300 ug/h via INTRAVENOUS
  Administered 2023-05-15: 250 ug/h via INTRAVENOUS
  Administered 2023-05-16: 100 ug/h via INTRAVENOUS
  Filled 2023-05-15 (×5): qty 250

## 2023-05-15 NOTE — Inpatient Diabetes Management (Signed)
 Inpatient Diabetes Program Recommendations  AACE/ADA: New Consensus Statement on Inpatient Glycemic Control (2015)  Target Ranges:  Prepandial:   less than 140 mg/dL      Peak postprandial:   less than 180 mg/dL (1-2 hours)      Critically ill patients:  140 - 180 mg/dL   Lab Results  Component Value Date   GLUCAP 294 (H) 05/15/2023   HGBA1C 5.0 05/11/2023    Latest Reference Range & Units 05/14/23 07:49 05/14/23 11:57 05/14/23 17:41 05/14/23 19:52 05/15/23 00:49 05/15/23 04:05 05/15/23 07:55  Glucose-Capillary 70 - 99 mg/dL 161 (H) 096 (H) 045 (H) 115 (H) 191 (H) 286 (H) 294 (H)  (H): Data is abnormally high  Inpatient Diabetes Program Recommendations:   Noted Vital tube feed increased to 55 ml/hr. @ 0100 am with increased CBGs. Please consider: -Increase Novolog tube feed coverage to 4 units q 4 hrs. (Hold if tube feed held or stopped for any reason)  Thank you, Darel Hong E. Martyna Thorns, RN, MSN, CDCES  Diabetes Coordinator Inpatient Glycemic Control Team Team Pager [phone] (8am-5pm) 05/15/2023 10:38 AM

## 2023-05-15 NOTE — Progress Notes (Signed)
 NAME:  Melinda Stone, MRN:  [id number], DOB:  [birth year], LOS: 4 ADMISSION DATE:  05/11/2023 History of Present Illness:  This is a case of a 43 year old female patient with a past medical history of mild intermittent asthma, GERD, iron deficiency anemia follows with hematology with a baseline hemoglobin of 8.0 mg/dL presenting to [facility] emergency department on 02/22 with complaint of nausea vomiting abdominal pain.  Found to be in acute hypoxic respiratory failure requiring high flow nasal cannula at 60% FiO2.  Pulmonary team has been consulted for help with further management.  She reports that she was exposed to her son who was found to have the flu and for the past couple of days she started having nausea vomiting abdominal pain with worsening shortness of breath which prompted her presentation to the emergency department.  She was initially on 2 L nasal cannula however this progressed to high flow nasal cannula at 60% FiO2.  Labs in the ED were pertinent for anemia with hemoglobin of 6.9 g/dL from a baseline of 8 g/dL, thrombocytopenia with platelets of 128, creatinine of 1.04 mg/dL from a baseline of 0.9.  Lactic acid 0.8.  Troponin initially 40 followed by 83.  Influenza A positive.  CTA chest ruled out pulmonary embolism but did show right predominant consolidative and nodular opacities.  She was started on ceftriaxone azithromycin and given 1 dose of p.o. furosemide.  Also for suspicion of asthma exacerbation she was given 1 dose of IV Solu-Medrol 40 mg x 1.  Patient was placed on BiPAP however remained tachypneic and therefore transferred to the ICU.  Repeat labs with hemoglobin 6.4 g/dL.  Repeat chest x-ray with worsening bilateral pulmonary opacities.  BNP at 1000.  Patient will be transferred to the ICU for monitoring nitro drip and further diuresis.   Significant Hospital Events: Including procedures, antibiotic start and stop dates in addition to other pertinent  events   05/12/2023 - Patient admitted to the ICU, intubated, CVC and aline placed, paralyzed ARDS 2/24 on ARDS protocol, on nimbex, minimal levophed  2/25 Receiving intermittent nimbex boluses but Fio2 40%, PEEP 8  2/26 On VC most of the day yesterday, desaturation event overnight, re-paralyzed   Interval events:  Intubated, sedated, paralyzed overnight due to desaturation event after bath with high peak pressures  Chest X-ray- no signs of pneumothorax   Objective   Blood pressure (!) 135/57, pulse 92, temperature 98.4 F (36.9 C), resp. rate 18, height 5\' 9"  (1.753 m), weight 102.2 kg, last menstrual period 04/01/2023, SpO2 100%. CVP:  [5 mmHg-29 mmHg] 9 mmHg  Vent Mode: PRVC FiO2 (%):  [40 %] 40 % Set Rate:  [18 bmp-26 bmp] 18 bmp Vt Set:  [390 mL-530 mL] 530 mL PEEP:  [8 cmH20] 8 cmH20 Plateau Pressure:  [20 cmH20-25 cmH20] 25 cmH20   Intake/Output Summary (Last 24 hours) at 05/15/2023 0742 Last data filed at 05/15/2023 0700 Gross per 24 hour  Intake 4168.87 ml  Output 1685 ml  Net 2483.87 ml   Filed Weights   05/13/23 0500 05/14/23 0500 05/15/23 0500  Weight: 96.1 kg 97.4 kg 102.2 kg    Examination: General: critically ill female, lying in icu bed on vent  HEENT: Normocephalic, pupils 2+ equal BL,nonreactive on paralytic- ETT, cortrak, Pink MM  CV: s1,s2, RRR, no MRG, No JVD  Pulm: clear, diminished, no distress on vent-paralyzed, Plat 23, Fio2 40%, PEEP 8  Abs: bs active, soft, Extremities: non pitting edema, no deformities Skin: no rash  Neuro: Rass -5 cough gag reflex present, BIS 41 GU: Foley, RN reporting patient having menses  Assessment & Plan:   Acute hypoxic respiratory failure due to flu pneumonia, likely superimposed bacterial pneumonia ARDS Acute asthma exacerbation  Intubated on 2/23  BC NGTD MRSA pcr neg 2/24  Tracheal aspirate 2/25, febrile  ABG 2/25 ph 7.324, Pco2 39.3, Po2 150, Bicarb 20.4  -PF ratio 375 Chest X-ray 2/25 No signs of  pneumothorax, pleural effusion  P:  Continue triple BD therapy  Continue ceftriaxone and azithromycin Continue Tamiflu  Continue ventilator support and lung protective strategies  Continue LTVV 6-8cc/kg  Wean PEEP and Fio2 requirements to sat goal of >92%  HOB > 30 degrees Plat pressures < 30  Intermittent Chest X-ray and ABGS Continue to follow cultures-blood and tracheal aspirate VAP and PAD protocols in place  Wean off paralytic, will attempt to come off today once again to evaluate ventilation and oxygenation   Sepsis due to pneumonia On low dose levophed, suspect this is more sedation  Due to shock state  No BCTD  P: Continue to antibiotic coverage as above Continue to trend WBC, and continue to monitor fever curve  Continue MAP goal >65, continue to wean off levophed as tolerated   AKI due to sepsis NAGMA likely from renal failure & diarrhea- improving  Making adequate urine  Cr  1.55>> 3.02>>3.01>>2.94 Creatinine beginning to trend down  P: Continue to trend renal function daily  Continue to monitor and optimize electrolytes daily  Continue to monitor urine output  Continue strict I/Os  Continue adequate renal perfusion, MAP goal >65 Continue to avoid nephrotoxic agents   Acute on chronic anemia  history of iron deficiency anemia-suspicious originally for ongoing hemolysis with elevated T. bili, elevated LDH. Smear negative for schistocytes.  Thrombocytopenia, likely due to sepsis DIC panel negative  Hgb 6.3 on 2/25 received 2 units of PRBCs  Hgb 7.8 to 7.1, patient also on menses Per nursing staff-menses started on 2/24  P: Receiving 1 unit of PRBCs, obtain post CBC 2 hours after  Continue to monitor for signs of bleeding  Possibly will need iron infusion  Continue to transfuse for hgb <7  Obtain CBC daily Recheck CBC at 1600   Elevated triglycerides  Triglycerides 157 On propofol P: Continue to trend, may need to stop in next 24hrs if  Triglyceride  level continues to rise  Repeat level for 2/27   Hyperbilirubinemia  likely due to sepsis vs hemolysis DIC panel negative RUQ negative  P:  Continue to trend intermittently, obtain CMET on 2/27  Continue supportive care  Continue treatment as above   At risk for malnutrition P: Continue tube feeds per OG RD following, appreciate assistance  H/o migraines P: Continue PAD protocol for main management as above  Continue PAD protocol for pain management Hold amitriptyline for now  Continue supportive care  H/o anxiety P: Continue gabapentin per tube Continue clonazepam   Hyperglycemia CBGs trending above goal range  P: Continue current SSI Increase lantus 30 units from 25 units daily  Continue tube feed coverage Continue CBGs q4  Continue CBG goal 140-180    Best Practice (right click and "Reselect all SmartList Selections" daily)   Diet/type: tube feeds  DVT prophylaxis prophylactic heparin  Pressure ulcer(s): N/A GI prophylaxis: PPI Lines: CVC, aline  Foley:  2/24  Code Status:  full code    Critical care time:  40 mins    Christian Julaine Zimny AGACNP-BC   [facility] 05/15/2023, 8:18 AM  Please see [email or web address] for pager details.  From 7A-7P if no response, please call [phone]. After hours, please call ELink [phone].

## 2023-05-16 DIAGNOSIS — J80 Acute respiratory distress syndrome: Secondary | ICD-10-CM | POA: Diagnosis not present

## 2023-05-16 DIAGNOSIS — J189 Pneumonia, unspecified organism: Secondary | ICD-10-CM | POA: Diagnosis not present

## 2023-05-16 DIAGNOSIS — Z9911 Dependence on respirator [ventilator] status: Secondary | ICD-10-CM | POA: Diagnosis not present

## 2023-05-16 DIAGNOSIS — J101 Influenza due to other identified influenza virus with other respiratory manifestations: Secondary | ICD-10-CM | POA: Diagnosis not present

## 2023-05-16 LAB — BPAM RBC
Blood Product Expiration Date: 202503212359
ISSUE DATE / TIME: 202502261336
Unit Type and Rh: 202503212359
Unit Type and Rh: 6200

## 2023-05-16 LAB — POCT I-STAT 7, (LYTES, BLD GAS, ICA,H+H)
Acid-base deficit: 4 mmol/L — ABNORMAL HIGH (ref 0.0–2.0)
Bicarbonate: 21.4 mmol/L (ref 20.0–28.0)
Calcium, Ion: 1.24 mmol/L (ref 1.15–1.40)
HCT: 19 % — ABNORMAL LOW (ref 36.0–46.0)
Hemoglobin: 6.5 g/dL — CL (ref 12.0–15.0)
O2 Saturation: 95 %
Patient temperature: 37.3
Potassium: 4.4 mmol/L (ref 3.5–5.1)
Sodium: 148 mmol/L — ABNORMAL HIGH (ref 135–145)
TCO2: 23 mmol/L (ref 22–32)
pCO2 arterial: 37.6 mm[Hg] (ref 32–48)
pH, Arterial: 7.364 (ref 7.35–7.45)
pO2, Arterial: 78 mm[Hg] — ABNORMAL LOW (ref 83–108)

## 2023-05-16 LAB — GLUCOSE, CAPILLARY
Glucose-Capillary: 153 mg/dL — ABNORMAL HIGH (ref 70–99)
Glucose-Capillary: 199 mg/dL — ABNORMAL HIGH (ref 70–99)
Glucose-Capillary: 217 mg/dL — ABNORMAL HIGH (ref 70–99)
Glucose-Capillary: 225 mg/dL — ABNORMAL HIGH (ref 70–99)
Glucose-Capillary: 239 mg/dL — ABNORMAL HIGH (ref 70–99)
Glucose-Capillary: 283 mg/dL — ABNORMAL HIGH (ref 70–99)

## 2023-05-16 LAB — COMPREHENSIVE METABOLIC PANEL
ALT: 21 U/L (ref 0–44)
AST: 28 U/L (ref 15–41)
Albumin: 2.1 g/dL — ABNORMAL LOW (ref 3.5–5.0)
Alkaline Phosphatase: 144 U/L — ABNORMAL HIGH (ref 38–126)
Anion gap: 12 (ref 5–15)
BUN: 56 mg/dL — ABNORMAL HIGH (ref 6–20)
CO2: 20 mmol/L — ABNORMAL LOW (ref 22–32)
Calcium: 8.6 mg/dL — ABNORMAL LOW (ref 8.9–10.3)
Chloride: 116 mmol/L — ABNORMAL HIGH (ref 98–111)
Creatinine, Ser: 2.62 mg/dL — ABNORMAL HIGH (ref 0.44–1.00)
GFR, Estimated: 23 mL/min — ABNORMAL LOW (ref >60)
Glucose, Bld: 199 mg/dL — ABNORMAL HIGH (ref 70–99)
Potassium: 4.3 mmol/L (ref 3.5–5.1)
Sodium: 148 mmol/L — ABNORMAL HIGH (ref 135–145)
Total Bilirubin: 0.7 mg/dL (ref 0.0–1.2)
Total Protein: 5.6 g/dL — ABNORMAL LOW (ref 6.5–8.1)

## 2023-05-16 LAB — TYPE AND SCREEN
ABO/RH(D): A POS
Antibody Screen: NEGATIVE
Unit division: 0

## 2023-05-16 LAB — MAGNESIUM: Magnesium: 2.5 mg/dL — ABNORMAL HIGH (ref 1.7–2.4)

## 2023-05-16 LAB — TRIGLYCERIDES: Triglycerides: 138 mg/dL (ref <150)

## 2023-05-16 LAB — CBC
HCT: 23.3 % — ABNORMAL LOW (ref 36.0–46.0)
Hemoglobin: 7.6 g/dL — ABNORMAL LOW (ref 12.0–15.0)
MCH: 27.6 pg (ref 26.0–34.0)
MCHC: 32.6 g/dL (ref 30.0–36.0)
MCV: 84.7 fL (ref 80.0–100.0)
Platelets: 101 10*3/uL — ABNORMAL LOW (ref 150–400)
RBC: 2.75 MIL/uL — ABNORMAL LOW (ref 3.87–5.11)
RDW: 18.8 % — ABNORMAL HIGH (ref 11.5–15.5)
WBC: 5.3 10*3/uL (ref 4.0–10.5)
nRBC: 0 % (ref 0.0–0.2)

## 2023-05-16 LAB — PHOSPHORUS: Phosphorus: 4.3 mg/dL (ref 2.5–4.6)

## 2023-05-16 MED ORDER — POTASSIUM CHLORIDE 20 MEQ PO PACK
40.0000 meq | PACK | Freq: Once | ORAL | Status: AC
  Administered 2023-05-16: 40 meq
  Filled 2023-05-16: qty 2

## 2023-05-16 MED ORDER — INSULIN ASPART 100 UNIT/ML IJ SOLN
7.0000 [IU] | INTRAMUSCULAR | Status: DC
  Administered 2023-05-16 – 2023-05-19 (×19): 7 [IU] via SUBCUTANEOUS

## 2023-05-16 MED ORDER — FUROSEMIDE 10 MG/ML IJ SOLN
80.0000 mg | Freq: Three times a day (TID) | INTRAMUSCULAR | Status: AC
  Administered 2023-05-16 – 2023-05-17 (×3): 80 mg via INTRAVENOUS
  Filled 2023-05-16 (×3): qty 8

## 2023-05-16 NOTE — Progress Notes (Signed)
 NAME:  JAIMEE CORUM, MRN:  [id number], DOB:  [birth year], LOS: 5 ADMISSION DATE:  05/11/2023 History of Present Illness:  This is a case of a 43 year old female patient with a past medical history of mild intermittent asthma, GERD, iron deficiency anemia follows with hematology with a baseline hemoglobin of 8.0 mg/dL presenting to [facility] emergency department on 02/22 with complaint of nausea vomiting abdominal pain.  Found to be in acute hypoxic respiratory failure requiring high flow nasal cannula at 60% FiO2.  Pulmonary team has been consulted for help with further management.  She reports that she was exposed to her son who was found to have the flu and for the past couple of days she started having nausea vomiting abdominal pain with worsening shortness of breath which prompted her presentation to the emergency department.  She was initially on 2 L nasal cannula however this progressed to high flow nasal cannula at 60% FiO2.  Labs in the ED were pertinent for anemia with hemoglobin of 6.9 g/dL from a baseline of 8 g/dL, thrombocytopenia with platelets of 128, creatinine of 1.04 mg/dL from a baseline of 0.9.  Lactic acid 0.8.  Troponin initially 40 followed by 83.  Influenza A positive.  CTA chest ruled out pulmonary embolism but did show right predominant consolidative and nodular opacities.  She was started on ceftriaxone azithromycin and given 1 dose of p.o. furosemide.  Also for suspicion of asthma exacerbation she was given 1 dose of IV Solu-Medrol 40 mg x 1.  Patient was placed on BiPAP however remained tachypneic and therefore transferred to the ICU.  Repeat labs with hemoglobin 6.4 g/dL.  Repeat chest x-ray with worsening bilateral pulmonary opacities.  BNP at 1000.  Patient will be transferred to the ICU for monitoring nitro drip and further diuresis.   Significant Hospital Events: Including procedures, antibiotic start and stop dates in addition to other pertinent  events   05/12/2023 - Patient admitted to the ICU, intubated, CVC and aline placed, paralyzed ARDS 2/24 on ARDS protocol, on nimbex, minimal levophed  2/25 Receiving intermittent nimbex boluses but Fio2 40%, PEEP 8  2/26 On VC most of the day yesterday, desaturation event overnight, re-paralyzed   Interval events:  Overnight no acute events. Remains off versed and nimbex.   Objective   Blood pressure (!) 151/55, pulse 96, temperature 99 F (37.2 C), resp. rate 20, height 5\' 9"  (1.753 m), weight 100 kg, last menstrual period 04/01/2023, SpO2 98%. CVP:  [4 mmHg-11 mmHg] 8 mmHg  Vent Mode: PRVC FiO2 (%):  [30 %-40 %] 30 % Set Rate:  [18 bmp] 18 bmp Vt Set:  [530 mL] 530 mL PEEP:  [5 cmH20-8 cmH20] 5 cmH20 Plateau Pressure:  [18 cmH20-25 cmH20] 18 cmH20   Intake/Output Summary (Last 24 hours) at 05/16/2023 1006 Last data filed at 05/16/2023 0800 Gross per 24 hour  Intake 2718.16 ml  Output 3745 ml  Net -1026.84 ml   Filed Weights   05/14/23 0500 05/15/23 0500 05/16/23 0500  Weight: 97.4 kg 102.2 kg 100 kg    Examination: General: critically ill appearing woman lying in bed in NAD HEENT: [place]/AT, eyes anicteric, ETT CV: S1S2, RRR, no murmurs Pulm: breathing comfortably on MV, occasional breath stacking but [place] ok. Abs: obese, soft, NT Extremities: +edema, no cyanosis Skin: warm, dry, no rashes Neuro: RASS -4, not following commands but starting to track GU: foley with yellow urine  7.36/38/78/21 Na+ 148 K+ 4.3 BUN 56 Cr 2.62 WBC 5.3 H/H  7.6/23.2 Platelets 101  Resp culture 2/23 NG Blood cultures:  NG 4 dasy Repeat resp culture:  NGTD   Assessment & Plan:   Hyperbilirubinemia due to sepsis, resolved  Assessment & Plan:   Acute hypoxic respiratory failure due to flu pneumonia, likely superimposed bacterial pneumonia ARDS Acute asthma exacerbation  -LTVV -PAD protocol; PAD goal adjusted to 0 to -1 unless respiratory status deteriorates -VAP prevention  protocol -daily SAT & SBT; rate was a little low, working on waking her up more -con't triple inhaled therapy for asthma -complete 7 days ceftriaxone and azithromycin -completed tamiflu -lasix x 3 doses today -hopeful for extubation in coming 1-2 days -ok to d/c aline  Sepsis due to pneumonia with associated AKI -7 days of ceftriaxone and azithromycin; last day 2/28 -maintain MAP >65  AKI due to sepsis NAGMA likely from renal failure & diarrhea- improving  -lasix q8h today -strict I/O -renally dose meds, avoid nephrotoxic meds -con't enteral bicarb for now until AKI improves and she is no longer acidotic  Acute on chronic anemia  history of iron deficiency anem;  Thrombocytopenia, likely due to sepsis -monitor, tranfuse for Hb <7 or hemodynamically significant bleeding -needs c-scope as OP and additional workup of anemia as indicated -enteral iron  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 -safe to con't propofol  HTN -hydralazine q6h PRN -metoprolol BID -with vent weaning, may need cleviprex  Obesity -long term recommend weight loss  At risk for malnutrition -TF  H/o migraines  H/o anxiety -con't gabapentin & clonazepam -weaning off versed she had earlier this admission  Hyperglycemia -con't glargine 30 units BID -increase TF coverage to 7 units q4h; hold if TF held -goal BG 140-180   Best Practice (right click and "Reselect all SmartList Selections" daily)   Diet/type: tube feeds  DVT prophylaxis prophylactic heparin  Pressure ulcer(s): N/A GI prophylaxis: PPI Lines: CVC, aline  Foley:  yes Code Status:  full code    Critical care time:       This patient is critically ill with multiple organ system failure which requires frequent high complexity decision making, assessment, support, evaluation, and titration of therapies. This was completed through the application of advanced monitoring technologies and extensive interpretation of multiple  databases. During this encounter critical care time was devoted to patient care services described in this note for 39 minutes.  Steffanie Dunn, DO 05/16/23 10:28 AM [facility]  For contact information, see Amion. If no response to pager, please call PCCM consult pager. After hours, 7PM- 7AM, please call Elink.

## 2023-05-17 ENCOUNTER — Inpatient Hospital Stay (HOSPITAL_COMMUNITY): Payer: 59

## 2023-05-17 DIAGNOSIS — J80 Acute respiratory distress syndrome: Secondary | ICD-10-CM | POA: Diagnosis not present

## 2023-05-17 DIAGNOSIS — J189 Pneumonia, unspecified organism: Secondary | ICD-10-CM | POA: Diagnosis not present

## 2023-05-17 LAB — CBC
HCT: 23.6 % — ABNORMAL LOW (ref 36.0–46.0)
Hemoglobin: 7.5 g/dL — ABNORMAL LOW (ref 12.0–15.0)
MCH: 27.5 pg (ref 26.0–34.0)
MCHC: 31.8 g/dL (ref 30.0–36.0)
MCV: 86.4 fL (ref 80.0–100.0)
Platelets: 132 10*3/uL — ABNORMAL LOW (ref 150–400)
RBC: 2.73 MIL/uL — ABNORMAL LOW (ref 3.87–5.11)
RDW: 18.8 % — ABNORMAL HIGH (ref 11.5–15.5)
WBC: 6.3 10*3/uL (ref 4.0–10.5)
nRBC: 0 % (ref 0.0–0.2)

## 2023-05-17 LAB — BASIC METABOLIC PANEL
Anion gap: 7 (ref 5–15)
BUN: 49 mg/dL — ABNORMAL HIGH (ref 6–20)
CO2: 26 mmol/L (ref 22–32)
Calcium: 8.7 mg/dL — ABNORMAL LOW (ref 8.9–10.3)
Chloride: 117 mmol/L — ABNORMAL HIGH (ref 98–111)
Creatinine, Ser: 1.83 mg/dL — ABNORMAL HIGH (ref 0.44–1.00)
GFR, Estimated: 35 mL/min — ABNORMAL LOW (ref >60)
Glucose, Bld: 204 mg/dL — ABNORMAL HIGH (ref 70–99)
Potassium: 4.5 mmol/L (ref 3.5–5.1)
Sodium: 150 mmol/L — ABNORMAL HIGH (ref 135–145)

## 2023-05-17 LAB — CULTURE, RESPIRATORY W GRAM STAIN: Gram Stain: NONE SEEN

## 2023-05-17 LAB — CULTURE, BLOOD (ROUTINE X 2)
Culture: NO GROWTH
Culture: NO GROWTH

## 2023-05-17 LAB — GLUCOSE, CAPILLARY
Glucose-Capillary: 137 mg/dL — ABNORMAL HIGH (ref 70–99)
Glucose-Capillary: 144 mg/dL — ABNORMAL HIGH (ref 70–99)
Glucose-Capillary: 157 mg/dL — ABNORMAL HIGH (ref 70–99)
Glucose-Capillary: 182 mg/dL — ABNORMAL HIGH (ref 70–99)
Glucose-Capillary: 207 mg/dL — ABNORMAL HIGH (ref 70–99)
Glucose-Capillary: 248 mg/dL — ABNORMAL HIGH (ref 70–99)

## 2023-05-17 MED ORDER — FUROSEMIDE 10 MG/ML IJ SOLN
80.0000 mg | Freq: Three times a day (TID) | INTRAMUSCULAR | Status: AC
  Administered 2023-05-17 (×2): 80 mg via INTRAVENOUS
  Filled 2023-05-17 (×2): qty 8

## 2023-05-17 MED ORDER — DEXMEDETOMIDINE HCL IN NACL 400 MCG/100ML IV SOLN
0.0000 ug/kg/h | INTRAVENOUS | Status: DC
  Administered 2023-05-17: 0.4 ug/kg/h via INTRAVENOUS
  Administered 2023-05-17: 1 ug/kg/h via INTRAVENOUS
  Administered 2023-05-17: 0.4 ug/kg/h via INTRAVENOUS
  Administered 2023-05-18 – 2023-05-19 (×8): 1.2 ug/kg/h via INTRAVENOUS
  Filled 2023-05-17 (×11): qty 100

## 2023-05-17 MED ORDER — VITAL 1.5 CAL PO LIQD
1000.0000 mL | ORAL | Status: DC
  Administered 2023-05-17 – 2023-05-20 (×4): 1000 mL
  Filled 2023-05-17 (×2): qty 1000

## 2023-05-17 MED ORDER — FREE WATER
200.0000 mL | Freq: Four times a day (QID) | Status: DC
  Administered 2023-05-17 – 2023-05-18 (×3): 200 mL

## 2023-05-17 MED ORDER — DEXMEDETOMIDINE HCL IN NACL 400 MCG/100ML IV SOLN
INTRAVENOUS | Status: AC
  Filled 2023-05-17: qty 100

## 2023-05-17 MED ORDER — PROSOURCE TF20 ENFIT COMPATIBL EN LIQD
60.0000 mL | Freq: Every day | ENTERAL | Status: DC
  Administered 2023-05-18 – 2023-05-21 (×4): 60 mL
  Filled 2023-05-17 (×6): qty 60

## 2023-05-17 MED ORDER — INSULIN GLARGINE 100 UNIT/ML ~~LOC~~ SOLN
35.0000 [IU] | Freq: Two times a day (BID) | SUBCUTANEOUS | Status: DC
Start: 2023-05-17 — End: 2023-05-19
  Administered 2023-05-17 – 2023-05-19 (×5): 35 [IU] via SUBCUTANEOUS
  Filled 2023-05-17 (×6): qty 0.35

## 2023-05-17 NOTE — TOC Initial Note (Addendum)
 Transition of Care [facility]) - Initial/Assessment Note    Patient Details  Name: Melinda Stone MRN: [id number] Date of Birth: 09/04/1980  [facility] CM/SW Contact:    Melinda Lewandowsky, RN Phone Number: 05/17/2023, 1:59 PM  Clinical Narrative: Patient presented for abdominal pain, emesis and headache-found to be in acute hypoxic respiratory failure. Cortrack placed this morning. PTA patient was from home with teenage children. Melinda Stone 66 year old daughter lives next door to the mother. Case Manager will continue to follow for transition of care needs                 Expected Discharge Plan:  (TBD) Barriers to Discharge: Continued Medical Work up  Choice offered to / list presented to : NA    Expected Discharge Plan and Services   Discharge Planning Services: CM Consult   Living arrangements for the past 2 months: Apartment    Prior Living Arrangements/Services Living arrangements for the past 2 months: Apartment Lives with:: Minor Children          Need for Family Participation in Patient Care: Yes (Comment) Care giver support system in place?: Yes (comment)   Criminal Activity/Legal Involvement Pertinent to Current Situation/Hospitalization: No - Comment as needed   Emotional Assessment   Attitude/Demeanor/Rapport: Unable to Assess Affect (typically observed): Unable to Assess   Alcohol / Substance Use: Not Applicable Psych Involvement: No (comment)  Admission diagnosis:  Nausea and vomiting [R11.2] Pneumonia due to infectious organism, unspecified laterality, unspecified part of lung [J18.9] Acute hypoxic respiratory failure (HCC) [J96.01] Patient Active Problem List   Diagnosis Date Noted   Hyperglycemia 05/15/2023   Acute hypoxic respiratory failure (HCC) 05/12/2023   Influenza A 05/12/2023   ARDS (adult respiratory distress syndrome) (HCC) 05/12/2023   On mechanically assisted ventilation (HCC) 05/12/2023   Pneumonia  due to infectious organism 05/12/2023   Nausea and vomiting 05/11/2023   CAP (community acquired pneumonia) 03/26/2023   Sepsis (HCC) 03/26/2023   Cellulitis of fifth toe 03/26/2023   Multifocal pneumonia 03/25/2023   DKA (diabetic ketoacidosis) (HCC) 12/24/2022   DKA, type 2 (HCC) 12/24/2022   Migraine 07/11/2022   Hypokalemia 05/14/2022   Normocytic anemia 05/14/2022   Pneumonitis 05/13/2022   Grade I diastolic dysfunction 05/13/2022   Acute pharyngitis 05/13/2022   Carpal tunnel syndrome 05/13/2022   Pain of breast 05/13/2022   Contusion of upper limb 05/13/2022   Obesity 05/13/2022   Overweight (BMI 25.0-29.9) 05/13/2022   Aspirat pneumonitis due to anesth during preg, first tri 05/05/2022   Need for prophylactic vaccination and inoculation against influenza 02/07/2022   Low serum vitamin B12 01/30/2022   IDA (iron deficiency anemia) 01/30/2022   Mild intermittent asthma 01/15/2022   Esophageal reflux 01/15/2022   Dyspnea 09/01/2021   Pulmonary nodules 09/01/2021   Absolute anemia 09/01/2021   Tobacco use 09/01/2021   Hypotension 09/01/2021   Nausea & vomiting 06/08/2021   UTI (urinary tract infection) 06/08/2021   Post laminectomy syndrome 04/11/2021   HNP (herniated nucleus pulposus), lumbar 11/14/2020   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   Chronic pain syndrome 06/14/2020   Pharmacologic therapy 06/14/2020   Disorder of skeletal system 06/14/2020   Problems influencing health status 06/14/2020   History of marijuana use 06/14/2020   History of illicit drug use 06/14/2020   Abnormal drug screen (05/25/2020) 06/14/2020   Marijuana use 06/14/2020   Abnormal MRI, lumbar spine (05/16/2020) 06/14/2020   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, L5-S1)  06/14/2020   Lumbar lateral recess stenosis (Left: L4-5, L5-S1) 06/14/2020   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) 06/14/2020   S/P lumbar microdiscectomy 06/07/2020   Recurrent  herniation of lumbar disc 05/10/2020   GI bleed 12/01/2019   Muscle spasm 11/06/2019   Sciatica 11/06/2019   Low back pain 02/04/2019   Type 2 diabetes mellitus with diabetic polyneuropathy, without long-term current use of insulin (HCC) 12/03/2018   Type 2 diabetes mellitus with hyperglycemia, without long-term current use of insulin (HCC) 05/15/2018   Acute bronchitis 12/01/2014   PCP:  Anselm Jungling, NP Pharmacy:   [facility] [phone] - [place] Otto, [place] - [address] AT [facility] [address] [place] [address] Phone: [phone] Fax: [phone]  Social Drivers of Health (SDOH) Social History: SDOH Screenings   Food Insecurity: Patient Unable To Answer (05/15/2023)  Housing: Patient Unable To Answer (05/15/2023)  Transportation Needs: Patient Unable To Answer (05/15/2023)  Utilities: Patient Unable To Answer (05/15/2023)  Depression (PHQ2-9): High Risk (05/30/2022)  Social Connections: Socially Isolated (03/26/2023)  Tobacco Use: High Risk (05/11/2023)   SDOH Interventions:     Readmission Risk Interventions    03/27/2023   11:25 AM  Readmission Risk Prevention Plan  Transportation Screening Complete  Medication Review (RN Care Manager) Referral to Pharmacy  PCP or Specialist appointment within 3-5 days of discharge Complete  HRI or Home Care Consult Not Complete  HRI or Home Care Consult Pt Refusal Comments Not applicable for [facility].  SW Recovery Care/Counseling Consult Complete  Palliative Care Screening Not Applicable  Skilled Nursing Facility Not Applicable

## 2023-05-17 NOTE — Progress Notes (Signed)
 Nutrition Follow-up  DOCUMENTATION CODES:   Not applicable  INTERVENTION:  Initiate tube feeding via Cortrak: -Vital1.5 at 55 ml/h (1320 ml per day) Initiate TF at goal rate -Prosource TF20 60 ml once daily -Free Water Flush Q6 hours   Provides 2060 kcal, 109 gm protein, 1808 ml free water daily (FWF + TF)   Continue MVI w/ minerals Thiamine 100mg  x1 more day  NUTRITION DIAGNOSIS:  Inadequate oral intake related to inability to eat as evidenced by NPO status. - remains applicable  GOAL:  Patient will meet greater than or equal to 90% of their needs - being met with tube feeds  MONITOR:  Diet advancement, Vent status, Labs, TF tolerance  REASON FOR ASSESSMENT:  Consult Enteral/tube feeding initiation and management  ASSESSMENT:   Patient admitted with c/o nausea, vomiting, and abdominal pain. Found to be in acute hypoxic respiratory failure. Influenza A positive. PMH: asthma, T2DM, GERD, iron deficiency anemia.  2/22 admitted  2/23 txr ICU/intubated/ OGT placed/ TF initiated 2/24 ECHO 2/25 intermittent nbx, transfused 2/26 re-paralyzed d/t desaturation over night 2/27 off nmbx, versed, fentanyl 2/28 extubated, OGT removed, Cortrak placed/TF re-initiated  Patient turned off of sedation this morning. Very restless Sister at bedside and very emotional as she did not know her sister was admitted. States her niece had not informed her of her sister's admission and she had just learned of it last night.  Extubated today and OGT removed. Cortrak ordered with placement pending. Recommend re-initiating at goal rate.    Admit Weight: 97.5kg Current Weight: 95kg  Wt stable. Continues with some generalized non-pitting edema. UOP desirable. Tube feeds to continue via Cortrak until patient can be assessed for PO intake.     Intake/Output Summary (Last 24 hours) at 05/17/2023 1123 Last data filed at 05/17/2023 1100 Gross per 24 hour  Intake 1677.88 ml  Output 6360 ml   Net -4682.12 ml    Net IO Since Admission: 95.81 mL [05/17/23 1123]    Drains/Lines:  CVC OGT (proximal stomach) UOP: 4.5L x24 hours   Meds: cyanocobalamin, docusate, furosemide, SSI, insulin glargine, MVI, pantoprazole, Miralax, thiamine Drips: Precedex @ 42mcg/hr Nimbex stopped 2/26 @ 0829 Versed stopped 2/26 @ 1232 Levo stopped 2/27 @ 0518 Fentanyl stopped 2/28 @ 1024 Propofol stopped 2/27 @ 0430   Labs: Na+ 148>150 (H) PHOS 5.6>5.2>4.3 (wdl) BUN 59>56>49 (H) Crt 1.61>0.96>0.45 (H) CBGs 199-204 x48 hours A1c 5.0 (04/2023) Hgb 7.6>6.5>7.5 (L)  Diet Order:   Diet Order             Diet NPO time specified  Diet effective now             EDUCATION NEEDS:   Not appropriate for education at this time  Skin:  Skin Assessment: Reviewed RN Assessment  Last BM:  2/28 - type 7 (FMS)  Height:  Ht Readings from Last 1 Encounters:  05/15/23 5\' 9"  (1.753 m)   Weight:  Wt Readings from Last 1 Encounters:  05/17/23 95 kg   Ideal Body Weight:  65.9 kg  BMI:  Body mass index is 30.93 kg/m.  Estimated Nutritional Needs:   Kcal:  1900-2100kcal  Protein:  95-110g  Fluid:  >1.9L/day  Myrtie Cruise MS, RD, LDN Registered Dietitian Clinical Nutrition RD Inpatient Contact Info in Amion

## 2023-05-17 NOTE — Progress Notes (Signed)
 RT called to bedside due to patient increased WOB. RT asked MD to assess patient as well. Per MD at bedside place patient on Bipap. RT placed patient on bipap, RN aware and at bedside as well.  Patient tolerating well at this time and WOB improved on bipap. RT will continue to monitor.

## 2023-05-17 NOTE — Plan of Care (Signed)
  Problem: Clinical Measurements: Goal: Ability to maintain clinical measurements within normal limits will improve Outcome: Progressing   Problem: Clinical Measurements: Goal: Respiratory complications will improve Outcome: Progressing   Problem: Clinical Measurements: Goal: Cardiovascular complication will be avoided Outcome: Progressing   Problem: Nutrition: Goal: Adequate nutrition will be maintained Outcome: Progressing

## 2023-05-17 NOTE — Procedures (Signed)
 Extubation Procedure Note  Patient Details:   Name: Melinda Stone DOB: Oct 13, 1980 MRN: [id number]   Airway Documentation:    Vent end date: (not recorded) Vent end time: (not recorded)   Evaluation  O2 sats: stable throughout Complications: No apparent complications Patient did tolerate procedure well. Bilateral Breath Sounds: Clear, Diminished   No, pt could not speak. Pt extubated successfully to 4 l/m [place].  Audrie Lia 05/17/2023, 11:07 AM

## 2023-05-17 NOTE — Progress Notes (Signed)
 NAME:  Melinda Stone, MRN:  [id number], DOB:  December 24, 1980, LOS: 6 ADMISSION DATE:  05/11/2023 History of Present Illness:  This is a case of a 43 year old female patient with a past medical history of mild intermittent asthma, GERD, iron deficiency anemia follows with hematology with a baseline hemoglobin of 8.0 mg/dL presenting to [facility] emergency department on 02/22 with complaint of nausea vomiting abdominal pain.  Found to be in acute hypoxic respiratory failure requiring high flow nasal cannula at 60% FiO2.  Pulmonary team has been consulted for help with further management.  She reports that she was exposed to her son who was found to have the flu and for the past couple of days she started having nausea vomiting abdominal pain with worsening shortness of breath which prompted her presentation to the emergency department.  She was initially on 2 L nasal cannula however this progressed to high flow nasal cannula at 60% FiO2.  Labs in the ED were pertinent for anemia with hemoglobin of 6.9 g/dL from a baseline of 8 g/dL, thrombocytopenia with platelets of 128, creatinine of 1.04 mg/dL from a baseline of 0.9.  Lactic acid 0.8.  Troponin initially 40 followed by 83.  Influenza A positive.  CTA chest ruled out pulmonary embolism but did show right predominant consolidative and nodular opacities.  She was started on ceftriaxone azithromycin and given 1 dose of p.o. furosemide.  Also for suspicion of asthma exacerbation she was given 1 dose of IV Solu-Medrol 40 mg x 1.  Patient was placed on BiPAP however remained tachypneic and therefore transferred to the ICU.  Repeat labs with hemoglobin 6.4 g/dL.  Repeat chest x-ray with worsening bilateral pulmonary opacities.  BNP at 1000.  Patient will be transferred to the ICU for monitoring nitro drip and further diuresis.   Significant Hospital Events: Including procedures, antibiotic start and stop dates in addition to other pertinent  events   05/12/2023 - Patient admitted to the ICU, intubated, CVC and aline placed, paralyzed ARDS 2/24 on ARDS protocol, on nimbex, minimal levophed  2/25 Receiving intermittent nimbex boluses but Fio2 40%, PEEP 8  2/26 On VC most of the day yesterday, desaturation event overnight, re-paralyzed   Interval events:  Sedation was turned off this morning Patient is tolerating spontaneous breathing trial Remain afebrile Restless and agitated  Objective   Blood pressure (!) 146/81, pulse 100, temperature 99.9 F (37.7 C), resp. rate 18, height 5\' 9"  (1.753 m), weight 95 kg, last menstrual period 04/01/2023, SpO2 96%. CVP:  [1 mmHg-22 mmHg] 4 mmHg  Vent Mode: PRVC FiO2 (%):  [30 %] 30 % Set Rate:  [18 bmp] 18 bmp Vt Set:  [530 mL] 530 mL PEEP:  [5 cmH20] 5 cmH20 Plateau Pressure:  [16 cmH20-21 cmH20] 21 cmH20   Intake/Output Summary (Last 24 hours) at 05/17/2023 0901 Last data filed at 05/17/2023 0700 Gross per 24 hour  Intake 1778.93 ml  Output 4985 ml  Net -3206.07 ml   Filed Weights   05/15/23 0500 05/16/23 0500 05/17/23 0256  Weight: 102.2 kg 100 kg 95 kg    Examination: General: Crtitically ill-appearing middle-age obese female, orally intubated HEENT: [place]/AT, eyes anicteric.  ETT and OGT in place Neuro: Eyes closed, opens spontaneously, restless and agitated, not following commands, moving all 4 extremities spontaneously Chest: Bilateral coarse breath sounds at the bases, no wheezes or rhonchi Heart: Regular rate and rhythm, no murmurs or gallops Abdomen: Soft, nondistended, bowel sounds present Skin: No rash   Na+  150 K+ 4.5 BUN 49 Cr 1.83 WBC 6.3 H/H 7.5/23.6 Platelets 132  Cultures have been negative to date   Assessment & Plan:   Hyperbilirubinemia due to sepsis, resolved Hypertriglyceridemia due to propofol  Assessment & Plan:  Acute hypoxic/Hypercapnic respiratory failure due to flu pneumonia, likely superimposed bacterial pneumonia with ARDS Acute  asthma exacerbation  Continue lung protective ventilation VAP prevention bundle in place PAD protocol with Precedex Propofol and fentanyl was stopped Patient is tolerating spontaneous breathing trial, will try to extubate her Continue ceftriaxone to complete 7 days therapy, continue azithromycin for 5 days Continue Tamiflu Continue diuresis with Lasix Continue nebs Continue triple inhaled therapy for asthma  Sepsis due to pneumonia with associated AKI Sepsis has improved  AKI due to septic ATN NAGMA likely from renal failure & diarrhea- improving  Hypernatremia Serum creatinine started improving Making good amount of urine Continue diuresis Monitor intake and output Avoid nephrotoxic agent Acidosis has resolved Continue free water flushes  Acute on chronic anemia  history of iron deficiency anem;  Thrombocytopenia, likely due to sepsis Monitor CBC Watch for signs of bleeding  Obesity Diet and exercise counseling when appropriate   Best Practice (right click and "Reselect all SmartList Selections" daily)   Diet/type: tube feeds  DVT prophylaxis prophylactic heparin  Pressure ulcer(s): N/A GI prophylaxis: PPI Lines: CVC, aline  Foley:  yes Code Status:  full code   The patient is critically ill due to acute respiratory failure with hypoxia and hypercapnia with ARDS.  Critical care was necessary to treat or prevent imminent or life-threatening deterioration.  Critical care was time spent personally by me on the following activities: development of treatment plan with patient and/or surrogate as well as nursing, discussions with consultants, evaluation of patient's response to treatment, examination of patient, obtaining history from patient or surrogate, ordering and performing treatments and interventions, ordering and review of laboratory studies, ordering and review of radiographic studies, pulse oximetry, re-evaluation of patient's condition and participation in  multidisciplinary rounds.   During this encounter critical care time was devoted to patient care services described in this note for 42 minutes.     Cheri Fowler, MD  Pulmonary Critical Care See Amion for pager If no response to pager, please call [phone] until 7pm After 7pm, Please call E-link [phone]

## 2023-05-17 NOTE — Procedures (Signed)
 Cortrak  Person Inserting Tube:  Greig Castilla D, RD Tube Type:  Cortrak - 43 inches Tube Size:  10 Tube Location:  Left nare Secured by: Bridle Technique Used to Measure Tube Placement:  Marking at nare/corner of mouth Cortrak Secured At:  80 cm Procedure Comments:  Cortrak Tube Team Note:  Consult received to place a Cortrak feeding tube.   No x-ray is required. RN may begin using tube. Will obtain XR to see if tip of tube is post-pyloric as pt is on bipap support at this time.  If the tube becomes dislodged please keep the tube and contact the Cortrak team at [email or web address] for replacement.  If after hours and replacement cannot be delayed, place a NG tube and confirm placement with an abdominal x-ray.    Greig Castilla, RD, LDN Registered Dietitian II Please reach out via secure chat Weekend on-call pager # available in [facility], [place]

## 2023-05-18 ENCOUNTER — Inpatient Hospital Stay (HOSPITAL_COMMUNITY)

## 2023-05-18 DIAGNOSIS — J189 Pneumonia, unspecified organism: Secondary | ICD-10-CM | POA: Diagnosis not present

## 2023-05-18 DIAGNOSIS — J80 Acute respiratory distress syndrome: Secondary | ICD-10-CM | POA: Diagnosis not present

## 2023-05-18 LAB — BASIC METABOLIC PANEL
Anion gap: 10 (ref 5–15)
BUN: 48 mg/dL — ABNORMAL HIGH (ref 6–20)
CO2: 28 mmol/L (ref 22–32)
Calcium: 8.5 mg/dL — ABNORMAL LOW (ref 8.9–10.3)
Chloride: 114 mmol/L — ABNORMAL HIGH (ref 98–111)
Creatinine, Ser: 1.51 mg/dL — ABNORMAL HIGH (ref 0.44–1.00)
GFR, Estimated: 44 mL/min — ABNORMAL LOW (ref >60)
Glucose, Bld: 210 mg/dL — ABNORMAL HIGH (ref 70–99)
Potassium: 4.1 mmol/L (ref 3.5–5.1)
Sodium: 152 mmol/L — ABNORMAL HIGH (ref 135–145)

## 2023-05-18 LAB — POCT I-STAT 7, (LYTES, BLD GAS, ICA,H+H)
Acid-Base Excess: 5 mmol/L — ABNORMAL HIGH (ref 0.0–2.0)
Bicarbonate: 28.7 mmol/L — ABNORMAL HIGH (ref 20.0–28.0)
Calcium, Ion: 1.18 mmol/L (ref 1.15–1.40)
HCT: 19 % — ABNORMAL LOW (ref 36.0–46.0)
Hemoglobin: 6.5 g/dL — CL (ref 12.0–15.0)
O2 Saturation: 92 %
Patient temperature: 100.4
Potassium: 3.9 mmol/L (ref 3.5–5.1)
Sodium: 155 mmol/L — ABNORMAL HIGH (ref 135–145)
TCO2: 30 mmol/L (ref 22–32)
pCO2 arterial: 41 mmHg (ref 32–48)
pH, Arterial: 7.457 — ABNORMAL HIGH (ref 7.35–7.45)
pO2, Arterial: 65 mmHg — ABNORMAL LOW (ref 83–108)

## 2023-05-18 LAB — GLUCOSE, CAPILLARY
Glucose-Capillary: 181 mg/dL — ABNORMAL HIGH (ref 70–99)
Glucose-Capillary: 182 mg/dL — ABNORMAL HIGH (ref 70–99)
Glucose-Capillary: 168 mg/dL — ABNORMAL HIGH (ref 70–99)
Glucose-Capillary: 97 mg/dL (ref 70–99)
Glucose-Capillary: 109 mg/dL — ABNORMAL HIGH (ref 70–99)

## 2023-05-18 LAB — CBC
HCT: 23.9 % — ABNORMAL LOW (ref 36.0–46.0)
Hemoglobin: 7.3 g/dL — ABNORMAL LOW (ref 12.0–15.0)
MCH: 26.9 pg (ref 26.0–34.0)
MCHC: 30.5 g/dL (ref 30.0–36.0)
MCV: 88.2 fL (ref 80.0–100.0)
Platelets: 184 10*3/uL (ref 150–400)
RBC: 2.71 MIL/uL — ABNORMAL LOW (ref 3.87–5.11)
RDW: 18.3 % — ABNORMAL HIGH (ref 11.5–15.5)
WBC: 5.5 10*3/uL (ref 4.0–10.5)
nRBC: 0 % (ref 0.0–0.2)

## 2023-05-18 LAB — HEMOGLOBIN AND HEMATOCRIT, BLOOD
HCT: 23.4 % — ABNORMAL LOW (ref 36.0–46.0)
Hemoglobin: 7.2 g/dL — ABNORMAL LOW (ref 12.0–15.0)

## 2023-05-18 MED ORDER — FUROSEMIDE 10 MG/ML IJ SOLN
80.0000 mg | Freq: Three times a day (TID) | INTRAMUSCULAR | Status: AC
  Administered 2023-05-18 (×2): 80 mg via INTRAVENOUS
  Filled 2023-05-18 (×2): qty 8

## 2023-05-18 MED ORDER — ORAL CARE MOUTH RINSE
15.0000 mL | Freq: Three times a day (TID) | OROMUCOSAL | Status: DC
  Administered 2023-05-18 – 2023-05-21 (×13): 15 mL via OROMUCOSAL

## 2023-05-18 MED ORDER — HALOPERIDOL LACTATE 5 MG/ML IJ SOLN
2.0000 mg | Freq: Once | INTRAMUSCULAR | Status: AC
  Administered 2023-05-18: 2 mg via INTRAVENOUS
  Filled 2023-05-18: qty 1

## 2023-05-18 MED ORDER — POTASSIUM CHLORIDE CRYS ER 20 MEQ PO TBCR
40.0000 meq | EXTENDED_RELEASE_TABLET | Freq: Once | ORAL | Status: AC
  Administered 2023-05-18: 40 meq via ORAL
  Filled 2023-05-18: qty 2

## 2023-05-18 MED ORDER — PANTOPRAZOLE SODIUM 40 MG IV SOLR
40.0000 mg | Freq: Every day | INTRAVENOUS | Status: DC
  Administered 2023-05-18 – 2023-05-21 (×4): 40 mg via INTRAVENOUS
  Filled 2023-05-18 (×4): qty 10

## 2023-05-18 MED ORDER — FREE WATER
200.0000 mL | Status: DC
  Administered 2023-05-18 – 2023-05-19 (×7): 200 mL

## 2023-05-18 NOTE — Progress Notes (Signed)
 RT NOTE: RT transported patient on bipap from room 2H19 to CT and back with no complications.

## 2023-05-18 NOTE — Progress Notes (Signed)
 NAME:  Melinda Stone, MRN:  [id number], DOB:  [birth year], LOS: 7 ADMISSION DATE:  05/11/2023 History of Present Illness:  This is a case of a 43 year old female patient with a past medical history of mild intermittent asthma, GERD, iron deficiency anemia follows with hematology with a baseline hemoglobin of 8.0 mg/dL presenting to [facility] emergency department on 02/22 with complaint of nausea vomiting abdominal pain.  Found to be in acute hypoxic respiratory failure requiring high flow nasal cannula at 60% FiO2.  Pulmonary team has been consulted for help with further management.  She reports that she was exposed to her son who was found to have the flu and for the past couple of days she started having nausea vomiting abdominal pain with worsening shortness of breath which prompted her presentation to the emergency department.  She was initially on 2 L nasal cannula however this progressed to high flow nasal cannula at 60% FiO2.  Labs in the ED were pertinent for anemia with hemoglobin of 6.9 g/dL from a baseline of 8 g/dL, thrombocytopenia with platelets of 128, creatinine of 1.04 mg/dL from a baseline of 0.9.  Lactic acid 0.8.  Troponin initially 40 followed by 83.  Influenza A positive.  CTA chest ruled out pulmonary embolism but did show right predominant consolidative and nodular opacities.  She was started on ceftriaxone azithromycin and given 1 dose of p.o. furosemide.  Also for suspicion of asthma exacerbation she was given 1 dose of IV Solu-Medrol 40 mg x 1.  Patient was placed on BiPAP however remained tachypneic and therefore transferred to the ICU.  Repeat labs with hemoglobin 6.4 g/dL.  Repeat chest x-ray with worsening bilateral pulmonary opacities.  BNP at 1000.  Patient will be transferred to the ICU for monitoring nitro drip and further diuresis.   Significant Hospital Events: Including procedures, antibiotic start and stop dates in addition to other pertinent  events   05/12/2023 - Patient admitted to the ICU, intubated, CVC and aline placed, paralyzed ARDS 2/24 on ARDS protocol, on nimbex, minimal levophed  2/25 Receiving intermittent nimbex boluses but Fio2 40%, PEEP 8  2/26 On VC most of the day yesterday, desaturation event overnight, re-paralyzed   Interval events:  Spiked fever with Tmax 101.8  She was extubated yesterday but remained altered and restless  Tracking examiner, trying to follow commands today Still on BiPAP  Objective   Blood pressure (!) 141/74, pulse 83, temperature 100 F (37.8 C), temperature source Axillary, resp. rate (!) 29, height 5\' 9"  (1.753 m), weight 91.7 kg, last menstrual period 04/01/2023, SpO2 94%. CVP:  [1 mmHg-9 mmHg] 4 mmHg  Vent Mode: PCV;BIPAP FiO2 (%):  [30 %-40 %] 40 % Set Rate:  [12 bmp] 12 bmp PEEP:  [6 cmH20] 6 cmH20 Pressure Support:  [8 cmH20] 8 cmH20   Intake/Output Summary (Last 24 hours) at 05/18/2023 0835 Last data filed at 05/18/2023 0700 Gross per 24 hour  Intake 2024.09 ml  Output 4998 ml  Net -2973.91 ml   Filed Weights   05/16/23 0500 05/17/23 0256 05/18/23 0425  Weight: 100 kg 95 kg 91.7 kg    Examination: General: Crtitically ill-appearing female, on BiPAP HEENT: [place]/AT, eyes anicteric.  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 Thrombocytopenia due to sepsis  Assessment & Plan:  Acute hypoxic/Hypercapnic respiratory failure due to flu pneumonia, likely superimposed bacterial pneumonia  with ARDS Acute asthma exacerbation  Patient was extubated yesterday, remained on BiPAP due to increased work of breathing On low-dose Precedex Completed Tamiflu and 7-day course of antibiotic therapy Still spiking fever but could be due to Precedex, white count is normal, closely monitor Continue diuresis, monitor intake and output Continue nebs and inhaled therapy for asthma Continue droplet precautions  Sepsis due to pneumonia with associated AKI Sepsis has improved  AKI due to septic ATN NAGMA likely from renal failure & diarrhea- improving  Hypernatremia Serum creatinine continue to improve, down to 1.5 Making good amount of urine, net -2 L Continue diuresis Monitor intake and output Avoid nephrotoxic agent Acidosis has resolved His frequency on free water flushes to 200 mL every 4 hours  Acute on chronic anemia  history of iron deficiency anem;  Monitor CBC Watch for signs of bleeding Platelet count went up to 182  Obesity Diet and exercise counseling when appropriate   Best Practice (right click and "Reselect all SmartList Selections" daily)   Diet/type: tube feeds  DVT prophylaxis prophylactic heparin  Pressure ulcer(s): N/A GI prophylaxis: PPI Lines: CVC, aline  Foley:  yes Code Status:  full code   The patient is critically ill due to acute respiratory failure with hypoxia and hypercapnia with ARDS.  Critical care was necessary to treat or prevent imminent or life-threatening deterioration.  Critical care was time spent personally by me on the following activities: development of treatment plan with patient and/or surrogate as well as nursing, discussions with consultants, evaluation of patient's response to treatment, examination of patient, obtaining history from patient or surrogate, ordering and performing treatments and interventions, ordering and review of laboratory studies, ordering and review of radiographic studies, pulse oximetry, re-evaluation of  patient's condition and participation in multidisciplinary rounds.   During this encounter critical care time was devoted to patient care services described in this note for 39 minutes.     Cheri Fowler, MD [place] See Amion for pager If no response to pager, please call [phone] until 7pm After 7pm, Please call E-link [phone]

## 2023-05-19 DIAGNOSIS — J189 Pneumonia, unspecified organism: Secondary | ICD-10-CM | POA: Diagnosis not present

## 2023-05-19 DIAGNOSIS — J80 Acute respiratory distress syndrome: Secondary | ICD-10-CM | POA: Diagnosis not present

## 2023-05-19 LAB — BASIC METABOLIC PANEL
Anion gap: 3 — ABNORMAL LOW (ref 5–15)
BUN: 49 mg/dL — ABNORMAL HIGH (ref 6–20)
CO2: 33 mmol/L — ABNORMAL HIGH (ref 22–32)
Calcium: 7.8 mg/dL — ABNORMAL LOW (ref 8.9–10.3)
Chloride: 117 mmol/L — ABNORMAL HIGH (ref 98–111)
Creatinine, Ser: 1.24 mg/dL — ABNORMAL HIGH (ref 0.44–1.00)
GFR, Estimated: 56 mL/min — ABNORMAL LOW (ref >60)
Glucose, Bld: 230 mg/dL — ABNORMAL HIGH (ref 70–99)
Potassium: 3.8 mmol/L (ref 3.5–5.1)
Sodium: 153 mmol/L — ABNORMAL HIGH (ref 135–145)

## 2023-05-19 LAB — GLUCOSE, CAPILLARY
Glucose-Capillary: 106 mg/dL — ABNORMAL HIGH (ref 70–99)
Glucose-Capillary: 198 mg/dL — ABNORMAL HIGH (ref 70–99)
Glucose-Capillary: 200 mg/dL — ABNORMAL HIGH (ref 70–99)
Glucose-Capillary: 221 mg/dL — ABNORMAL HIGH (ref 70–99)
Glucose-Capillary: 226 mg/dL — ABNORMAL HIGH (ref 70–99)
Glucose-Capillary: 229 mg/dL — ABNORMAL HIGH (ref 70–99)
Glucose-Capillary: 246 mg/dL — ABNORMAL HIGH (ref 70–99)

## 2023-05-19 LAB — CBC
HCT: 23.5 % — ABNORMAL LOW (ref 36.0–46.0)
Hemoglobin: 7.1 g/dL — ABNORMAL LOW (ref 12.0–15.0)
MCH: 26.5 pg (ref 26.0–34.0)
MCHC: 30.2 g/dL (ref 30.0–36.0)
MCV: 87.7 fL (ref 80.0–100.0)
Platelets: 231 10*3/uL (ref 150–400)
RBC: 2.68 MIL/uL — ABNORMAL LOW (ref 3.87–5.11)
RDW: 17.6 % — ABNORMAL HIGH (ref 11.5–15.5)
WBC: 5.1 10*3/uL (ref 4.0–10.5)
nRBC: 0 % (ref 0.0–0.2)

## 2023-05-19 LAB — MAGNESIUM: Magnesium: 2 mg/dL (ref 1.7–2.4)

## 2023-05-19 MED ORDER — INSULIN GLARGINE 100 UNIT/ML ~~LOC~~ SOLN
37.0000 [IU] | Freq: Two times a day (BID) | SUBCUTANEOUS | Status: DC
  Administered 2023-05-19: 37 [IU] via SUBCUTANEOUS
  Filled 2023-05-19 (×4): qty 0.37

## 2023-05-19 MED ORDER — FREE WATER
200.0000 mL | Status: DC
  Administered 2023-05-19 – 2023-05-21 (×19): 200 mL

## 2023-05-19 MED ORDER — INSULIN ASPART 100 UNIT/ML IJ SOLN
9.0000 [IU] | INTRAMUSCULAR | Status: DC
  Administered 2023-05-19 – 2023-05-20 (×4): 9 [IU] via SUBCUTANEOUS

## 2023-05-19 MED ORDER — ONDANSETRON HCL 4 MG/2ML IJ SOLN
4.0000 mg | Freq: Four times a day (QID) | INTRAMUSCULAR | Status: DC | PRN
  Administered 2023-05-19 – 2023-05-21 (×6): 4 mg via INTRAVENOUS
  Filled 2023-05-19 (×6): qty 2

## 2023-05-19 MED ORDER — MELATONIN 3 MG PO TABS
3.0000 mg | ORAL_TABLET | Freq: Once | ORAL | Status: AC
  Administered 2023-05-19: 3 mg via ORAL
  Filled 2023-05-19: qty 1

## 2023-05-19 MED ORDER — POTASSIUM CHLORIDE 20 MEQ PO PACK
20.0000 meq | PACK | Freq: Once | ORAL | Status: AC
  Administered 2023-05-19: 20 meq
  Filled 2023-05-19: qty 1

## 2023-05-19 NOTE — Progress Notes (Signed)
 eLink Physician-Brief Progress Note Patient Name: Melinda Stone DOB: 08/22/1980 MRN: [id number]   Date of Service  05/19/2023  HPI/Events of Note  Insomnia, RN asking for melatonin. Off of precedex, step down status.   Asthma, PNA, flu+ admitted since 2/22. Extubated on 28 th.   eICU Interventions  Melatonin ordered once.      Intervention Category Minor Interventions: Other:  Ranee Gosselin 05/19/2023, 7:42 PM

## 2023-05-19 NOTE — Progress Notes (Signed)
 NAME:  Melinda Stone, MRN:  [id number], DOB:  1980/04/19, LOS: 8 ADMISSION DATE:  05/11/2023 History of Present Illness:  This is a case of a 43 year old female patient with a past medical history of mild intermittent asthma, GERD, iron deficiency anemia follows with hematology with a baseline hemoglobin of 8.0 mg/dL presenting to [facility] emergency department on 02/22 with complaint of nausea vomiting abdominal pain.  Found to be in acute hypoxic respiratory failure requiring high flow nasal cannula at 60% FiO2.  Pulmonary team has been consulted for help with further management.  She reports that she was exposed to her son who was found to have the flu and for the past couple of days she started having nausea vomiting abdominal pain with worsening shortness of breath which prompted her presentation to the emergency department.  She was initially on 2 L nasal cannula however this progressed to high flow nasal cannula at 60% FiO2.  Labs in the ED were pertinent for anemia with hemoglobin of 6.9 g/dL from a baseline of 8 g/dL, thrombocytopenia with platelets of 128, creatinine of 1.04 mg/dL from a baseline of 0.9.  Lactic acid 0.8.  Troponin initially 40 followed by 83.  Influenza A positive.  CTA chest ruled out pulmonary embolism but did show right predominant consolidative and nodular opacities.  She was started on ceftriaxone azithromycin and given 1 dose of p.o. furosemide.  Also for suspicion of asthma exacerbation she was given 1 dose of IV Solu-Medrol 40 mg x 1.  Patient was placed on BiPAP however remained tachypneic and therefore transferred to the ICU.  Repeat labs with hemoglobin 6.4 g/dL.  Repeat chest x-ray with worsening bilateral pulmonary opacities.  BNP at 1000.  Patient will be transferred to the ICU for monitoring nitro drip and further diuresis.   Significant Hospital Events: Including procedures, antibiotic start and stop dates in addition to other pertinent  events   05/12/2023 - Patient admitted to the ICU, intubated, CVC and aline placed, paralyzed ARDS 2/24 on ARDS protocol, on nimbex, minimal levophed  2/25 Receiving intermittent nimbex boluses but Fio2 40%, PEEP 8  2/26 On VC most of the day yesterday, desaturation event overnight, re-paralyzed   Interval events:  Patient remained afebrile Mental status has improved significantly No overnight issues  Objective   Blood pressure 135/77, pulse 71, temperature 97.8 F (36.6 C), temperature source Oral, resp. rate 14, height 5\' 9"  (1.753 m), weight 91.5 kg, last menstrual period 04/01/2023, SpO2 94%.    FiO2 (%):  [32 %] 32 %   Intake/Output Summary (Last 24 hours) at 05/19/2023 1308 Last data filed at 05/19/2023 1200 Gross per 24 hour  Intake 2775.82 ml  Output 2318 ml  Net 457.82 ml   Filed Weights   05/17/23 0256 05/18/23 0425 05/19/23 0500  Weight: 95 kg 91.7 kg 91.5 kg    Examination: General: Middle-aged female, lying on the bed HEENT: [place]/AT, eyes anicteric.  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 Thrombocytopenia due to sepsis Sepsis due to pneumonia  Assessment & Plan:  Acute hypoxic/Hypercapnic respiratory failure due to flu pneumonia, likely superimposed bacterial pneumonia with ARDS Acute asthma exacerbation  Patient was extubated on 2/28, required BiPAP  for 24 hours Now she is on 3 L nasal cannula oxygen, with O2 sat in mid 90s Continue to titrate oxygen with O2 sat goal 92% Completed 7-day course of Tamiflu Not spiking fever anymore Continue gentle diuresis Continue nebs and inhaled therapy for asthma Continue  droplet precautions Completed antibiotic therapy with ceftriaxone azithromycin  Acute septic encephalopathy Patient was agitated and restless postextubation She was requiring Precedex, currently titrated off Her mental status has significantly improved  AKI due to septic ATN NAGMA likely from renal failure & diarrhea- improving  Hypernatremia Serum creatinine continue to improve, down to 1.2 Making good amount of urine Continue gentle diuresis Monitor intake and output Avoid nephrotoxic agent Acidosis has resolved Serum sodium slightly trended down to 153 Increase free water flushes to 200 mL every 2 hours  Acute on chronic anemia  history of iron deficiency anemia;  Monitor CBC Watch for signs of bleeding  Obesity Diet and exercise counseling provided   Best Practice (right click and "Reselect all SmartList Selections" daily)   Diet/type: tube feeds  DVT prophylaxis prophylactic heparin  Pressure ulcer(s): N/A GI prophylaxis: PPI Lines: Discontinued Foley: DC Code Status:  full code    Cheri Fowler, MD [place] [facility] See Amion for pager If no response to pager, please call [phone] until 7pm After 7pm, Please call E-link [phone]

## 2023-05-20 ENCOUNTER — Inpatient Hospital Stay (HOSPITAL_COMMUNITY)

## 2023-05-20 DIAGNOSIS — J9601 Acute respiratory failure with hypoxia: Secondary | ICD-10-CM | POA: Diagnosis not present

## 2023-05-20 DIAGNOSIS — R739 Hyperglycemia, unspecified: Secondary | ICD-10-CM | POA: Diagnosis not present

## 2023-05-20 DIAGNOSIS — R112 Nausea with vomiting, unspecified: Secondary | ICD-10-CM | POA: Diagnosis not present

## 2023-05-20 DIAGNOSIS — J189 Pneumonia, unspecified organism: Secondary | ICD-10-CM | POA: Diagnosis not present

## 2023-05-20 LAB — GLUCOSE, CAPILLARY
Glucose-Capillary: 133 mg/dL — ABNORMAL HIGH (ref 70–99)
Glucose-Capillary: 147 mg/dL — ABNORMAL HIGH (ref 70–99)
Glucose-Capillary: 150 mg/dL — ABNORMAL HIGH (ref 70–99)
Glucose-Capillary: 155 mg/dL — ABNORMAL HIGH (ref 70–99)
Glucose-Capillary: 182 mg/dL — ABNORMAL HIGH (ref 70–99)
Glucose-Capillary: 197 mg/dL — ABNORMAL HIGH (ref 70–99)
Glucose-Capillary: 214 mg/dL — ABNORMAL HIGH (ref 70–99)
Glucose-Capillary: 90 mg/dL (ref 70–99)

## 2023-05-20 LAB — CBC
HCT: 27.8 % — ABNORMAL LOW (ref 36.0–46.0)
Hemoglobin: 8.6 g/dL — ABNORMAL LOW (ref 12.0–15.0)
MCH: 26.9 pg (ref 26.0–34.0)
MCHC: 30.9 g/dL (ref 30.0–36.0)
MCV: 86.9 fL (ref 80.0–100.0)
Platelets: 298 10*3/uL (ref 150–400)
RBC: 3.2 MIL/uL — ABNORMAL LOW (ref 3.87–5.11)
RDW: 17.4 % — ABNORMAL HIGH (ref 11.5–15.5)
WBC: 6.4 10*3/uL (ref 4.0–10.5)
nRBC: 0 % (ref 0.0–0.2)

## 2023-05-20 LAB — RENAL FUNCTION PANEL
Albumin: 2.6 g/dL — ABNORMAL LOW (ref 3.5–5.0)
Anion gap: 12 (ref 5–15)
BUN: 56 mg/dL — ABNORMAL HIGH (ref 6–20)
CO2: 25 mmol/L (ref 22–32)
Calcium: 8.5 mg/dL — ABNORMAL LOW (ref 8.9–10.3)
Chloride: 114 mmol/L — ABNORMAL HIGH (ref 98–111)
Creatinine, Ser: 1.22 mg/dL — ABNORMAL HIGH (ref 0.44–1.00)
GFR, Estimated: 57 mL/min — ABNORMAL LOW (ref >60)
Glucose, Bld: 109 mg/dL — ABNORMAL HIGH (ref 70–99)
Phosphorus: 3.6 mg/dL (ref 2.5–4.6)
Potassium: 4 mmol/L (ref 3.5–5.1)
Sodium: 151 mmol/L — ABNORMAL HIGH (ref 135–145)

## 2023-05-20 LAB — MAGNESIUM: Magnesium: 2.1 mg/dL (ref 1.7–2.4)

## 2023-05-20 MED ORDER — BANATROL TF EN LIQD
60.0000 mL | Freq: Two times a day (BID) | ENTERAL | Status: DC
  Administered 2023-05-20 – 2023-05-21 (×3): 60 mL
  Filled 2023-05-20 (×3): qty 60

## 2023-05-20 MED ORDER — VITAL 1.5 CAL PO LIQD
550.0000 mL | ORAL | Status: DC
  Administered 2023-05-20: 550 mL
  Filled 2023-05-20 (×3): qty 1000
  Filled 2023-05-20: qty 711

## 2023-05-20 MED ORDER — INSULIN ASPART 100 UNIT/ML IJ SOLN
5.0000 [IU] | INTRAMUSCULAR | Status: DC
  Administered 2023-05-20 – 2023-05-21 (×5): 5 [IU] via SUBCUTANEOUS

## 2023-05-20 MED ORDER — OXYCODONE HCL 5 MG PO TABS
10.0000 mg | ORAL_TABLET | Freq: Four times a day (QID) | ORAL | Status: DC | PRN
  Administered 2023-05-20 – 2023-05-21 (×2): 10 mg
  Filled 2023-05-20 (×2): qty 2

## 2023-05-20 MED ORDER — INSULIN GLARGINE 100 UNIT/ML ~~LOC~~ SOLN
30.0000 [IU] | Freq: Two times a day (BID) | SUBCUTANEOUS | Status: DC
  Administered 2023-05-20 – 2023-05-21 (×2): 30 [IU] via SUBCUTANEOUS
  Filled 2023-05-20 (×3): qty 0.3

## 2023-05-20 NOTE — Evaluation (Signed)
 Physical Therapy Evaluation Patient Details Name: Melinda Stone MRN: [id number] DOB: 03-22-1980 Today's Date: 05/20/2023  History of Present Illness  43 year old F presented to ED on 2/22 with nausea, vomiting, and abdominal pain. Pt went into acute respiratory failure with hypoxia due to flu A and asthma exacerbation requiring intubation on 2/22. Pt extubated on 2/28. PMH: mild intermittent asthma, IDDM-2, IDA, GERD, chronic pain and mood disorder   Clinical Impression  Pt admitted with above. PTA pt indep without AD and lives in an apt with her children. Pt currently presenting with weakness, impaired balance, delayed processing, impaired sequencing, maxA for transfers, and assist for ADLs. Pt very deconditioned with decreased activity tolerance. At this time pt to benefit from aggressive rehab program > 3 hours a day however hopefully now that pt is medically improving pt will progress well enough to achieve level of function to return home with family. Acute PT to cont to follow.        If plan is discharge home, recommend the following: A lot of help with walking and/or transfers;A lot of help with bathing/dressing/bathroom;Assist for transportation;Assistance with cooking/housework   Can travel by private vehicle        Equipment Recommendations  (TBD at next venue)  Recommendations for Other Services  Rehab consult    Functional Status Assessment Patient has had a recent decline in their functional status and demonstrates the ability to make significant improvements in function in a reasonable and predictable amount of time.     Precautions / Restrictions Precautions Precautions: Fall Restrictions Weight Bearing Restrictions Per Provider Order: No      Mobility  Bed Mobility Overal bed mobility: Needs Assistance Bed Mobility: Supine to Sit, Sit to Supine     Supine to sit: Mod assist Sit to supine: Mod assist   General bed mobility comments: increased time,  verbal step by step directional cues, modA for trunk eleavtion and to scoot to EOB, modA for LE management back into bed    Transfers Overall transfer level: Needs assistance Equipment used: 2 person hand held assist Transfers: Sit to/from Stand, Bed to chair/wheelchair/BSC Sit to Stand: Mod assist, +2 physical assistance   Step pivot transfers: Max assist, +2 physical assistance       General transfer comment: maximal verbal step by step commands, maxAx2 to power up and complete step pvt transfer to/from [facility]. Pt hyperextends bilat knees and has increased trunk/hip flexion to try to stabilize self, max verbal and tactile cues to stand up right and sequence stepping to/from [facility]    Ambulation/Gait               General Gait Details: limited to step to transfer to/from [facility] and side steps along EOB  Stairs            Wheelchair Mobility     Tilt Bed    Modified Rankin (Stroke Patients Only)       Balance Overall balance assessment: Needs assistance Sitting-balance support: Feet supported, No upper extremity supported Sitting balance-Leahy Scale: Fair     Standing balance support: Bilateral upper extremity supported, During functional activity, Reliant on assistive device for balance Standing balance-Leahy Scale: Poor Standing balance comment: dependent on external support                             Pertinent Vitals/Pain Pain Assessment Pain Assessment: Faces Faces Pain Scale: Hurts little more Pain Location: pt reports back Pain  Intervention(s): Monitored during session    Home Living Family/patient expects to be discharged to:: Private residence Living Arrangements: Children Available Help at Discharge: Family;Available PRN/intermittently Type of Home: Apartment Home Access: Level entry       Home Layout: One level Home Equipment: None      Prior Function Prior Level of Function : Independent/Modified Independent              Mobility Comments: reports not using AD, doesn't drive, children do her grocery shopping ADLs Comments: mod I     Extremity/Trunk Assessment   Upper Extremity Assessment Upper Extremity Assessment: Generalized weakness    Lower Extremity Assessment Lower Extremity Assessment: Generalized weakness (grossly 3/5)    Cervical / Trunk Assessment Cervical / Trunk Assessment: Normal  Communication   Communication Communication:  (soft spoken) Factors Affecting Communication: Reduced clarity of speech    Cognition Arousal: Lethargic (increased level of alertness once up EOB) Behavior During Therapy: Flat affect   PT - Cognitive impairments: No family/caregiver present to determine baseline                       PT - Cognition Comments: pt soft spoken, delayed response time, mildly impulsive with decreased safety awareness and insight to deficits Following commands: Intact       Cueing Cueing Techniques: Verbal cues, Tactile cues     General Comments General comments (skin integrity, edema, etc.): VSS on 1Lo2 via [place], pt dependent for hygiene s/p urination    Exercises     Assessment/Plan    PT Assessment Patient needs continued PT services  PT Problem List Decreased strength;Decreased activity tolerance;Decreased balance;Decreased mobility;Decreased coordination;Decreased cognition       PT Treatment Interventions DME instruction;Gait training;Stair training;Functional mobility training;Therapeutic activities;Therapeutic exercise;Balance training;Neuromuscular re-education    PT Goals (Current goals can be found in the Care Plan section)  Acute Rehab PT Goals Patient Stated Goal: didn't state PT Goal Formulation: With patient Time For Goal Achievement: 06/03/23 Potential to Achieve Goals: Good    Frequency Min 1X/week     Co-evaluation               AM-PAC PT "6 Clicks" Mobility  Outcome Measure Help needed turning from your back to your side  while in a flat bed without using bedrails?: A Lot Help needed moving from lying on your back to sitting on the side of a flat bed without using bedrails?: A Lot Help needed moving to and from a bed to a chair (including a wheelchair)?: A Lot Help needed standing up from a chair using your arms (e.g., wheelchair or bedside chair)?: A Lot Help needed to walk in hospital room?: Total Help needed climbing 3-5 steps with a railing? : Total 6 Click Score: 10    End of Session Equipment Utilized During Treatment: Gait belt;Oxygen Activity Tolerance: Patient tolerated treatment well Patient left: in bed;with call bell/phone within reach;with bed alarm set Nurse Communication: Mobility status (RN present to assist with transfer) PT Visit Diagnosis: Unsteadiness on feet (R26.81)    Time: 4098-1191 PT Time Calculation (min) (ACUTE ONLY): 24 min   Charges:   PT Evaluation $PT Eval Moderate Complexity: 1 Mod PT Treatments $Therapeutic Activity: 8-22 mins PT General Charges $$ ACUTE PT VISIT: 1 Visit         Lewis Shock, PT, DPT Acute Rehabilitation Services Secure chat preferred Office #: [phone]   Iona Hansen 05/20/2023, 1:35 PM

## 2023-05-20 NOTE — Plan of Care (Signed)
  Problem: Education: Goal: Ability to describe self-care measures that may prevent or decrease complications (Diabetes Survival Skills Education) will improve Outcome: Progressing Goal: Individualized Educational Video(s) Outcome: Progressing   Problem: Coping: Goal: Ability to adjust to condition or change in health will improve Outcome: Progressing   Problem: Fluid Volume: Goal: Ability to maintain a balanced intake and output will improve Outcome: Progressing   Problem: Health Behavior/Discharge Planning: Goal: Ability to identify and utilize available resources and services will improve Outcome: Progressing Goal: Ability to manage health-related needs will improve Outcome: Progressing   Problem: Metabolic: Goal: Ability to maintain appropriate glucose levels will improve Outcome: Progressing   Problem: Nutritional: Goal: Maintenance of adequate nutrition will improve Outcome: Progressing Goal: Progress toward achieving an optimal weight will improve Outcome: Progressing   Problem: Skin Integrity: Goal: Risk for impaired skin integrity will decrease Outcome: Progressing   Problem: Tissue Perfusion: Goal: Adequacy of tissue perfusion will improve Outcome: Progressing   Problem: Education: Goal: Knowledge of General Education information will improve Description: Including pain rating scale, medication(s)/side effects and non-pharmacologic comfort measures Outcome: Progressing   Problem: Health Behavior/Discharge Planning: Goal: Ability to manage health-related needs will improve Outcome: Progressing   Problem: Clinical Measurements: Goal: Ability to maintain clinical measurements within normal limits will improve Outcome: Progressing Goal: Will remain free from infection Outcome: Progressing Goal: Diagnostic test results will improve Outcome: Progressing Goal: Respiratory complications will improve Outcome: Progressing Goal: Cardiovascular complication will  be avoided Outcome: Progressing   Problem: Activity: Goal: Risk for activity intolerance will decrease Outcome: Progressing   Problem: Nutrition: Goal: Adequate nutrition will be maintained Outcome: Progressing   Problem: Coping: Goal: Level of anxiety will decrease Outcome: Progressing   Problem: Elimination: Goal: Will not experience complications related to bowel motility Outcome: Progressing Goal: Will not experience complications related to urinary retention Outcome: Progressing   Problem: Pain Managment: Goal: General experience of comfort will improve and/or be controlled Outcome: Progressing   Problem: Safety: Goal: Ability to remain free from injury will improve Outcome: Progressing   Problem: Skin Integrity: Goal: Risk for impaired skin integrity will decrease Outcome: Progressing   Problem: Activity: Goal: Ability to tolerate increased activity will improve Outcome: Progressing   Problem: Respiratory: Goal: Ability to maintain a clear airway and adequate ventilation will improve Outcome: Progressing   Problem: Role Relationship: Goal: Method of communication will improve Outcome: Progressing

## 2023-05-20 NOTE — Progress Notes (Signed)
Attempted to call report to RN on 5W 

## 2023-05-20 NOTE — Progress Notes (Addendum)
 Nutrition Follow-up  DOCUMENTATION CODES:   Not applicable  INTERVENTION:  Modify tube feeding via Cortrak to nocturnal feeds: -Vital1.5 at 55 ml/h from 8PM-6AM ( per day)   -Prosource TF20 60 ml once daily -Free Water Flush Q2 hours   Provides 1200 kcal, 57 gm protein, 2820 ml free water daily (FWF + TF)   Continue MVI w/ minerals Add Banatrol BID, each packet w/ 5g soluble fiber to bulk stool Magic cup TID with meals, each supplement provides 290 kcal and 9 grams of protein   NUTRITION DIAGNOSIS:  Inadequate oral intake related to inability to eat as evidenced by NPO status. - remains applicable  GOAL:  Patient will meet greater than or equal to 90% of their needs - being met with tube feeds  MONITOR:  Diet advancement, Vent status, Labs, TF tolerance  REASON FOR ASSESSMENT:  Consult Enteral/tube feeding initiation and management  ASSESSMENT:  Patient admitted with c/o nausea, vomiting, and abdominal pain. Found to be in acute hypoxic respiratory failure. Influenza A positive. PMH: asthma, T2DM, GERD, iron deficiency anemia.  2/22 admitted  2/23 txr ICU/intubated/ OGT placed/ TF initiated 2/24 ECHO 2/25 intermittent nbx, transfused 2/26 re-paralyzed d/t desaturation over night 2/27 off nmbx, versed, fentanyl 2/28 extubated, OGT removed, Cortrak placed/TF re-initiated 3/3 MBSS, Regular Diet, thin liquids  Pt required 24 hours of BiPAP. Now on 3L [place]. Continues with gentle diuresis and on droplet precautions. No longer on any sedation.   Pt more alert this morning. Bedside swallow eval completed this morning.  MBSS showed patient appropriate for regular diet, thin liquids. Will recommend nocturnal feedings at this time to allow for increased PO intake. PT/OT consults placed. RN endorses diarrhea. Will add Banatrol to bulk stool. FMS removed 3/2.  Admit Weight: 97.5kg Current Weight: 89.3kg  No significant edema noted. With UOP of 1.2L  yesterday. Sodium  remains elevated. Free water flushes ( ) increased to Q2 hours from Q6 hours. Crt and Hgb have stabilized.   Intake/Output Summary (Last 24 hours) at 05/20/2023 1112 Last data filed at 05/20/2023 0800 Gross per 24 hour  Intake 3385.5 ml  Output 1630 ml  Net 1755.5 ml    Meds: cyanocobalamin, SSI, insulin glargine, MVI, pantoprazole, Miralax  Labs:  Na+ 155>153>151 (H) Crt 3.02--->1.22 (H) Hgb 6.5--->8.6 (L) CBGs 109-230 x48 hours A1c 5.0 (04/2023)  Diet Order:   Diet Order             Diet NPO time specified  Diet effective now             EDUCATION NEEDS:  Not appropriate for education at this time  Skin:  Skin Assessment: Reviewed RN Assessment  Last BM:  3/2 - type 7  Height:  Ht Readings from Last 1 Encounters:  05/15/23 5\' 9"  (1.753 m)   Weight:  Wt Readings from Last 1 Encounters:  05/20/23 89.3 kg   Ideal Body Weight:  65.9 kg  BMI:  Body mass index is 29.07 kg/m.  Estimated Nutritional Needs:   Kcal:  1900-2100kcal  Protein:  95-110g  Fluid:  >1.9L/day  Myrtie Cruise MS, RD, LDN Registered Dietitian Clinical Nutrition RD Inpatient Contact Info in Amion

## 2023-05-20 NOTE — Evaluation (Signed)
 Clinical/Bedside Swallow Evaluation Patient Details  Name: Melinda Stone MRN: [id number] Date of Birth: 31-Dec-1980  Today's Date: 05/20/2023 Time: SLP Start Time (ACUTE ONLY): 0940 SLP Stop Time (ACUTE ONLY): 4098 SLP Time Calculation (min) (ACUTE ONLY): 12 min  Past Medical History:  Past Medical History:  Diagnosis Date   Anemia    Arthritis 08/17/2020   Chronic back pain    Diabetes mellitus without complication (HCC)    type 2   GERD (gastroesophageal reflux disease)    Migraine    Ovarian cyst    Pneumonia    Polyneuropathy    Past Surgical History:  Past Surgical History:  Procedure Laterality Date   CESAREAN SECTION     CHOLECYSTECTOMY  2004   LUMBAR LAMINECTOMY N/A 05/30/2020   Procedure: Lumbar five Laminectomy,  Bilateral Microdiscectomy, Left Lumbar five -Sacral one Microdiscectomy;  Surgeon: [name], MD;  Location: [facility] OR;  Service: Orthopedics;  Laterality: N/A;   LUMBAR LAMINECTOMY Left 11/14/2020   Procedure: LEFT LUMBAR FOUR-FIVE MICRODISCECTOMY;  Surgeon: [name], MD;  Location: [facility] OR;  Service: Orthopedics;  Laterality: Left;   TUBAL LIGATION  10/19/2010   HPI:  Melinda Stone is a 43 yo female presenting to ED 2/22 with complaints of nausea, vomiting, abdominal pain, and myalgias. Found to be Influenza A positive. CTA showed R predominant consolidative and nodular opacities. ETT 2/23-2/28. 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 She states her vocal quality is currently low in volume compared to baseline. She has immediate and prolonged coughing following trials of thin liquids. Pt has reduced awareness of purees and  solids with prolonged oral transit, but no s/s of aspiration. Recommend proceeding with an MBS prior to initiating a diet, which SLP will f/u to complete (tentatively scheduled for this afternoon). Pt has a Cortrak for medication administration in the interim. SLP Visit Diagnosis: Dysphagia, unspecified (R13.10)    Aspiration Risk  Moderate aspiration risk    Diet Recommendation NPO;Alternative means - temporary    Medication Administration: Via alternative means    Other  Recommendations Oral Care Recommendations: Oral care QID    Recommendations for follow up therapy are one component of a multi-disciplinary discharge planning process, led by the attending physician.  Recommendations may be updated based on patient status, additional functional criteria and insurance authorization.  Follow up Recommendations Acute inpatient rehab (3hours/day)      Assistance Recommended at Discharge    Functional Status Assessment Patient has had a recent decline in their functional status and demonstrates the ability to make significant improvements in function in a reasonable and predictable amount of time.  Frequency and Duration min 2x/week  2 weeks       Prognosis Prognosis for improved oropharyngeal function: Good Barriers to Reach Goals: Cognitive deficits;Time post onset      Swallow Study   General HPI: Melinda Stone is a 43 yo female presenting to ED 2/22 with complaints of nausea, vomiting, abdominal pain, and myalgias. Found to be Influenza A positive. CTA showed R predominant consolidative and nodular opacities. ETT 2/23-2/28. 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,  obesity, history of B12 and iron deficiency, T2DM, DKA, diabetic retinopathy, GERD, PNA Type of Study: Bedside Swallow Evaluation Previous Swallow Assessment: none in chart Diet Prior to this Study: NPO;Cortrak/Small bore NG  tube Temperature Spikes Noted: No Respiratory Status: Nasal cannula History of Recent Intubation: Yes Total duration of intubation (days): 6 days Date extubated: 05/17/23 Behavior/Cognition: Alert;Cooperative;Pleasant mood Oral Cavity Assessment: Within Functional Limits Oral Care Completed by SLP: No Oral Cavity - Dentition: Adequate natural dentition Vision: Functional for self-feeding Self-Feeding Abilities: Able to feed self Patient Positioning: Upright in chair Baseline Vocal Quality: Hoarse;Low vocal intensity Volitional Cough: Strong Volitional Swallow: Able to elicit    Oral/Motor/Sensory Function Overall Oral Motor/Sensory Function: Within functional limits   Ice Chips Ice chips: Not tested   Thin Liquid Thin Liquid: Impaired Presentation: Straw;Self Fed Pharyngeal  Phase Impairments: Cough - Immediate    Nectar Thick Nectar Thick Liquid: Not tested   Honey Thick Honey Thick Liquid: Not tested   Puree Puree: Within functional limits Presentation: Spoon;Self Fed   Solid     Solid: Within functional limits Presentation: Self Fed      [name], M.A., CF-SLP Speech Language Pathology, Acute Rehabilitation Services  Secure Chat preferred [phone]  05/20/2023,10:46 AM

## 2023-05-20 NOTE — Progress Notes (Addendum)
 PROGRESS NOTE  Melinda Stone FIE:[id number] DOB: 10-14-1980   PCP: Anselm Jungling, NP  Patient is from: Home.  DOA: 05/11/2023 LOS: 9  Chief complaints Chief Complaint  Patient presents with   Abdominal Pain   Emesis   Headache     Brief Narrative / Interim history: 43 year old F with PMH of mild intermittent asthma, IDDM-2, IDA, GERD, chronic pain and mood disorder presented to ED on 2/22 with nausea, vomiting and abdominal pain and found to be in acute respiratory failure with hypoxia due to influenza A infection and asthma exacerbation.  Initially, she required 2 L but oxygen requirement escalated quickly requiring BiPAP then intubation and admission to ICU.  CT angio chest negative for PE but right predominant consolidative and nodular opacities concerning for pneumonia.  Patient was started on Tamiflu, ceftriaxone, Zithromax, steroid and breathing treatments.  Patient's respiratory status improved.  She was extubated on 2/28 and transferred to [facility] on 3/3.  Hospital course complicated by acute septic encephalopathy and postintubation dysphagia requiring tube feeding.  Speech therapy following.    Subjective: Seen and examined earlier this morning.  No major events overnight of this morning.  No complaints.  Feels thirsty and like to drink water but she understand the need for clearance by SLP.  Per RN, she got up at bedside and stood for a little bit.  She also sat in the chair.   Objective: Vitals:   05/20/23 0814 05/20/23 0819 05/20/23 0919 05/20/23 0920  BP:   129/80   Pulse:    84  Resp:    13  Temp:      TempSrc:      SpO2: 99% 99%  97%  Weight:      Height:        Examination:  GENERAL: No apparent distress.  Nontoxic. HEENT: MMM.  Vision and hearing grossly intact.  Feeding tube. NECK: Supple.  No apparent JVD.  RESP:  No IWOB.  Fair aeration bilaterally.  Rhonchi. CVS:  RRR. Heart sounds normal.  ABD/GI/GU: BS+. Abd soft, NTND.   MSK/EXT:  Moves extremities. No apparent deformity. No edema.  SKIN: no apparent skin lesion or wound NEURO: Awake, alert and oriented appropriately.  No apparent focal neuro deficit. PSYCH: Calm. Normal affect.   Procedures:  2/22-2/28-intubation and mechanical ventilation  Microbiology summarized: 2/22-influenza A PCR positive 2/22-COVID-19, influenza B and RSV PCR nonreactive 2/22- A 20 pathogen RVP nonreactive 2/23-blood cultures NGTD 2/25-respiratory cultures NGTD    Assessment and plan: Acute respiratory failure with hypoxia and hypercapnia due to acute asthma exacerbation: Resolving Acute respiratory distress: Resolving Influenza A infection superimposed by bacterial pneumonia -Intubated from 2/22-2/28.  Currently saturating in upper 90s on 1 L. -Continue nebulizers-scheduled and as needed -Completed course of Tamiflu and CAP coverage with ceftriaxone and Zithromax. -SLP eval for postintubation dysphagia -Wean oxygen as able    Acute septic encephalopathy: MRI brain without acute finding.  B12 within normal.  Likely due to the above.  Resolved. -Off Precedex drip. -Reorientation and delirium precaution -Minimize avoid sedating medications   AKI/azotemia due to septic ATN: Resolving. NAGMA likely from renal failure & diarrhea-resolved. Hypernatremia: Improved. Recent Labs    05/12/23 0152 05/12/23 0941 05/13/23 0700 05/14/23 0515 05/15/23 0400 05/16/23 0400 05/17/23 0343 05/18/23 0349 05/19/23 0431 05/20/23 0852  BUN 27* 31* 51* 52* 59* 56* 49* 48* 49* 56*  CREATININE 1.40* 1.55* 3.02* 3.01* 2.94* 2.62* 1.83* 1.51* 1.24* 1.22*  -Continue free water 200 cc every 2 hours  for hypernatremia   Acute on chronic anemia/history of IDA already: Since she has been transfused 1 unit.  Anemia panel with some iron deficiency.  Hgb improved. Recent Labs    05/15/23 1226 05/15/23 1716 05/16/23 0400 05/16/23 0401 05/17/23 0343 05/18/23 0349 05/18/23 0749  05/18/23 0755 05/19/23 0431 05/20/23 0852  HGB 6.5* 7.7* 7.6* 6.5* 7.5* 7.3* 6.5* 7.2* 7.1* 8.6*  -Will start p.o. iron once she start taking p.o. -Monitor  IDDM-2 with hyperglycemia: A1c 5.0%. Recent Labs  Lab 05/19/23 2017 05/20/23 0007 05/20/23 0355 05/20/23 0808 05/20/23 1134  GLUCAP 106* 147* 133* 90 182*  -Decrease basal insulin from 37 to 30 units twice daily with holding parameters -Decrease tube feed coverage from 7 to 5 units every 4 hours. -Continue SSI-every 4 hours.  Chronic pain/mood disorder: On Percocet, Zanaflex and gabapentin.  Narcotic database reviewed. -Change Percocet to every 6 hours as needed -Continue home gabapentin at half dose -Continue holding home amitriptyline and Zanaflex.  Nausea, vomiting and abdominal pain: Likely due to influenza A infection.  Seems to have resolved.  RUQ [place] with chronic hepatomegaly and cholecystectomy. -Antiemetics as needed  Physical deconditioning due to acute illness: -PT/OT   Obesity Diet and exercise counseling provided  Oropharyngeal dysphagia: Likely due to intubation. -Continue tube feeds with free water pending SLP eval. Body mass index is 29.07 kg/m. Nutrition Problem: Inadequate oral intake Etiology: inability to eat Signs/Symptoms: NPO status Interventions: Tube feeding   DVT prophylaxis:  heparin injection 5,000 Units Start: 05/14/23 2200 Place and maintain sequential compression device Start: 05/11/23 1225  Code Status: Full code Family Communication: None at bedside Level of care: Progressive Status is: Inpatient Remains inpatient appropriate because: Respiratory failure, influenza A, bacterial pneumonia and dysphagia   Final disposition: Home Consultants:  Pulmonology admitted patient  55 minutes with more than 50% spent in reviewing records, counseling patient/family and coordinating care.   Sch Meds:  Scheduled Meds:  sodium chloride   Intravenous Once   sodium chloride    Intravenous Once   arformoterol  15 mcg Nebulization BID   budesonide (PULMICORT) nebulizer solution  0.5 mg Nebulization BID   Chlorhexidine Gluconate Cloth  6 each Topical Daily   cyanocobalamin  1,000 mcg Intramuscular Once   docusate  100 mg Per Tube BID   feeding supplement (PROSource TF20)  60 mL Per Tube Daily   fiber supplement (BANATROL TF)  60 mL Per Tube BID   free water  200 mL Per Tube Q2H   gabapentin  400 mg Per Tube Q8H   heparin injection (subcutaneous)  5,000 Units Subcutaneous Q8H   insulin aspart  0-24 Units Subcutaneous Q4H   insulin aspart  5 Units Subcutaneous Q4H   insulin glargine  30 Units Subcutaneous BID   metoprolol tartrate  25 mg Per Tube BID   multivitamin  15 mL Per Tube Daily   mouth rinse  15 mL Mouth Rinse TID WC & HS   oxyCODONE  10 mg Per Tube Q6H   pantoprazole (PROTONIX) IV  40 mg Intravenous QHS   polyethylene glycol  17 g Per Tube Daily   revefenacin  175 mcg Nebulization Daily   sodium chloride flush  3 mL Intravenous Q12H   vitamin B-12  100 mcg Per Tube Daily   Continuous Infusions:  feeding supplement (VITAL 1.5 CAL) 1,000 mL (05/20/23 0828)   PRN Meds:.acetaminophen (TYLENOL) oral liquid 160 mg/5 mL, acetaminophen, hydrALAZINE, ondansetron (ZOFRAN) IV, mouth rinse, polyvinyl alcohol  Antimicrobials: Anti-infectives (From admission,  onward)    Start     Dose/Rate Route Frequency Ordered Stop   05/14/23 1000  oseltamivir (TAMIFLU) capsule 30 mg        30 mg Per Tube Daily 05/13/23 1446 05/16/23 0940   05/12/23 2200  oseltamivir (TAMIFLU) capsule 75 mg  Status:  Discontinued        75 mg Per Tube 2 times daily 05/12/23 1600 05/13/23 1446   05/12/23 1500  cefTRIAXone (ROCEPHIN) 2 g in sodium chloride 0.9 % 100 mL IVPB  Status:  Discontinued        2 g 200 mL/hr over 30 Minutes Intravenous Every 24 hours 05/11/23 1617 05/11/23 2109   05/12/23 1500  azithromycin (ZITHROMAX) 500 mg in sodium chloride 0.9 % 250 mL IVPB  Status:   Discontinued        500 mg 250 mL/hr over 60 Minutes Intravenous Every 24 hours 05/11/23 1617 05/17/23 0923   05/12/23 1300  vancomycin (VANCOREADY) IVPB 1500 mg/300 mL        1,500 mg 150 mL/hr over 120 Minutes Intravenous  Once 05/12/23 1203 05/12/23 1700   05/12/23 1245  vancomycin (VANCOREADY) IVPB 1750 mg/350 mL  Status:  Discontinued        1,750 mg 175 mL/hr over 120 Minutes Intravenous  Once 05/12/23 1148 05/12/23 1203   05/12/23 1149  vancomycin variable dose per unstable renal function (pharmacist dosing)  Status:  Discontinued         Does not apply See admin instructions 05/12/23 1149 05/13/23 1445   05/12/23 0830  cefTRIAXone (ROCEPHIN) 2 g in sodium chloride 0.9 % 100 mL IVPB        2 g 200 mL/hr over 30 Minutes Intravenous Daily 05/12/23 0751 05/17/23 1618   05/11/23 2215  oseltamivir (TAMIFLU) capsule 75 mg  Status:  Discontinued        75 mg Oral 2 times daily 05/11/23 2120 05/11/23 2121   05/11/23 2215  oseltamivir (TAMIFLU) capsule 75 mg  Status:  Discontinued        75 mg Oral 2 times daily 05/11/23 2123 05/12/23 1600   05/11/23 2200  ceFEPIme (MAXIPIME) 2 g in sodium chloride 0.9 % 100 mL IVPB  Status:  Discontinued        2 g 200 mL/hr over 30 Minutes Intravenous Every 8 hours 05/11/23 2109 05/12/23 0751   05/11/23 2200  vancomycin (VANCOREADY) IVPB 2000 mg/400 mL  Status:  Discontinued        2,000 mg 200 mL/hr over 120 Minutes Intravenous  Once 05/11/23 2109 05/11/23 2118   05/11/23 2108  vancomycin variable dose per unstable renal function (pharmacist dosing)  Status:  Discontinued         Does not apply See admin instructions 05/11/23 2109 05/11/23 2118   05/11/23 1015  cefTRIAXone (ROCEPHIN) 1 g in sodium chloride 0.9 % 100 mL IVPB        1 g 200 mL/hr over 30 Minutes Intravenous  Once 05/11/23 1000 05/11/23 1539   05/11/23 1015  azithromycin (ZITHROMAX) 500 mg in sodium chloride 0.9 % 250 mL IVPB        500 mg 250 mL/hr over 60 Minutes Intravenous  Once  05/11/23 1000 05/11/23 1657        I have personally reviewed the following labs and images: CBC: Recent Labs  Lab 05/16/23 0400 05/16/23 0401 05/17/23 0343 05/18/23 0349 05/18/23 0749 05/18/23 0755 05/19/23 0431 05/20/23 0852  WBC 5.3  --  6.3 5.5  --   --  5.1 6.4  HGB 7.6*   < > 7.5* 7.3* 6.5* 7.2* 7.1* 8.6*  HCT 23.3*   < > 23.6* 23.9* 19.0* 23.4* 23.5* 27.8*  MCV 84.7  --  86.4 88.2  --   --  87.7 86.9  PLT 101*  --  132* 184  --   --  231 298   < > = values in this interval not displayed.   BMP &GFR Recent Labs  Lab 05/14/23 0515 05/14/23 0751 05/15/23 0400 05/15/23 0407 05/16/23 0400 05/16/23 0401 05/17/23 0343 05/18/23 0349 05/18/23 0749 05/19/23 0431 05/20/23 0852  NA 139   < > 145   < > 148*   < > 150* 152* 155* 153* 151*  K 4.3   < > 4.8   < > 4.3   < > 4.5 4.1 3.9 3.8 4.0  CL 116*  --  113*  --  116*  --  117* 114*  --  117* 114*  CO2 19*  --  21*  --  20*  --  26 28  --  33* 25  GLUCOSE 183*  --  312*  --  199*  --  204* 210*  --  230* 109*  BUN 52*  --  59*  --  56*  --  49* 48*  --  49* 56*  CREATININE 3.01*  --  2.94*  --  2.62*  --  1.83* 1.51*  --  1.24* 1.22*  CALCIUM 7.3*  --  8.0*  --  8.6*  --  8.7* 8.5*  --  7.8* 8.5*  MG 2.2  --  2.3  --  2.5*  --   --   --   --  2.0 2.1  PHOS 5.6*  --  5.2*  --  4.3  --   --   --   --   --  3.6   < > = values in this interval not displayed.   Estimated Creatinine Clearance: 72.2 mL/min (A) (by C-G formula based on SCr of 1.22 mg/dL (H)). Liver & Pancreas: Recent Labs  Lab 05/16/23 0400 05/20/23 0852  AST 28  --   ALT 21  --   ALKPHOS 144*  --   BILITOT 0.7  --   PROT 5.6*  --   ALBUMIN 2.1* 2.6*   No results for input(s): "LIPASE", "AMYLASE" in the last 168 hours. No results for input(s): "AMMONIA" in the last 168 hours. Diabetic: No results for input(s): "HGBA1C" in the last 72 hours. Recent Labs  Lab 05/19/23 2017 05/20/23 0007 05/20/23 0355 05/20/23 0808 05/20/23 1134  GLUCAP 106*  147* 133* 90 182*   Cardiac Enzymes: No results for input(s): "CKTOTAL", "CKMB", "CKMBINDEX", "TROPONINI" in the last 168 hours. No results for input(s): "PROBNP" in the last 8760 hours. Coagulation Profile: No results for input(s): "INR", "PROTIME" in the last 168 hours. Thyroid Function Tests: No results for input(s): "TSH", "T4TOTAL", "FREET4", "T3FREE", "THYROIDAB" in the last 72 hours. Lipid Profile: No results for input(s): "CHOL", "HDL", "LDLCALC", "TRIG", "CHOLHDL", "LDLDIRECT" in the last 72 hours. Anemia Panel: No results for input(s): "VITAMINB12", "FOLATE", "FERRITIN", "TIBC", "IRON", "RETICCTPCT" in the last 72 hours. Urine analysis:    Component Value Date/Time   COLORURINE AMBER (A) 05/11/2023 0612   APPEARANCEUR HAZY (A) 05/11/2023 0612   LABSPEC 1.015 05/11/2023 0612   PHURINE 5.0 05/11/2023 0612   GLUCOSEU 50 (A) 05/11/2023 0612   HGBUR LARGE (A) 05/11/2023 0612   BILIRUBINUR NEGATIVE 05/11/2023 1191  BILIRUBINUR small (A) 02/04/2023 1027   KETONESUR NEGATIVE 05/11/2023 0612   PROTEINUR >=300 (A) 05/11/2023 0612   UROBILINOGEN 0.2 02/04/2023 1027   NITRITE NEGATIVE 05/11/2023 0612   LEUKOCYTESUR NEGATIVE 05/11/2023 0612   Sepsis Labs: Invalid input(s): "PROCALCITONIN", "LACTICIDVEN"  Microbiology: Recent Results (from the past 240 hours)  Resp panel by RT-PCR (RSV, Flu A&B, Covid) Anterior Nasal Swab     Status: None   Collection Time: 05/11/23  5:44 AM   Specimen: Anterior Nasal Swab  Result Value Ref Range Status   SARS Coronavirus 2 by RT PCR NEGATIVE NEGATIVE Final   Influenza A by PCR NEGATIVE NEGATIVE Final   Influenza B by PCR NEGATIVE NEGATIVE Final    Comment: (NOTE) The Xpert Xpress SARS-CoV-2/FLU/RSV plus assay is intended as an aid in the diagnosis of influenza from Nasopharyngeal swab specimens and should not be used as a sole basis for treatment. Nasal washings and aspirates are unacceptable for Xpert Xpress  SARS-CoV-2/FLU/RSV testing.  Fact Sheet for Patients: BloggerCourse.com  Fact Sheet for Healthcare Providers: SeriousBroker.it  This test is not yet approved or cleared by the [place] FDA and has been authorized for detection and/or diagnosis of SARS-CoV-2 by FDA under an Emergency Use Authorization (EUA). This EUA will remain in effect (meaning this test can be used) for the duration of the COVID-19 declaration under Section 564(b)(1) of the Act, 21 U.S.C. section 360bbb-3(b)(1), unless the authorization is terminated or revoked.     Resp Syncytial Virus by PCR NEGATIVE NEGATIVE Final    Comment: (NOTE) Fact Sheet for Patients: BloggerCourse.com  Fact Sheet for Healthcare Providers: SeriousBroker.it  This test is not yet approved or cleared by the [place] FDA and has been authorized for detection and/or diagnosis of SARS-CoV-2 by FDA under an Emergency Use Authorization (EUA). This EUA will remain in effect (meaning this test can be used) for the duration of the COVID-19 declaration under Section 564(b)(1) of the Act, 21 U.S.C. section 360bbb-3(b)(1), unless the authorization is terminated or revoked.  Performed at [facility], [address] [place], [place] [address]   Respiratory (~20 pathogens) panel by PCR     Status: None   Collection Time: 05/11/23 12:10 PM   Specimen: Nasopharyngeal Swab; Respiratory  Result Value Ref Range Status   Adenovirus NOT DETECTED NOT DETECTED Final   Coronavirus 229E NOT DETECTED NOT DETECTED Final    Comment: (NOTE) The Coronavirus on the Respiratory Panel, DOES NOT test for the novel  Coronavirus (2019 nCoV)    Coronavirus HKU1 NOT DETECTED NOT DETECTED Final   Coronavirus NL63 NOT DETECTED NOT DETECTED Final   Coronavirus OC43 NOT DETECTED NOT DETECTED Final   Metapneumovirus NOT DETECTED NOT DETECTED Final    Rhinovirus / Enterovirus NOT DETECTED NOT DETECTED Final   Influenza A NOT DETECTED NOT DETECTED Final   Influenza B NOT DETECTED NOT DETECTED Final   Parainfluenza Virus 1 NOT DETECTED NOT DETECTED Final   Parainfluenza Virus 2 NOT DETECTED NOT DETECTED Final   Parainfluenza Virus 3 NOT DETECTED NOT DETECTED Final   Parainfluenza Virus 4 NOT DETECTED NOT DETECTED Final   Respiratory Syncytial Virus NOT DETECTED NOT DETECTED Final   Bordetella pertussis NOT DETECTED NOT DETECTED Final   Bordetella Parapertussis NOT DETECTED NOT DETECTED Final   Chlamydophila pneumoniae NOT DETECTED NOT DETECTED Final   Mycoplasma pneumoniae NOT DETECTED NOT DETECTED Final    Comment: Performed at [facility], [address] [place], [place] [address]  Resp panel by RT-PCR (RSV, Flu A&B, Covid) Anterior Nasal Swab     Status: Abnormal   Collection Time: 05/11/23  8:09 PM   Specimen: Anterior Nasal Swab  Result Value Ref Range Status   SARS Coronavirus 2 by RT PCR NEGATIVE NEGATIVE Final   Influenza A by PCR POSITIVE (A) NEGATIVE Final   Influenza B by PCR NEGATIVE NEGATIVE Final    Comment: (NOTE) The Xpert Xpress SARS-CoV-2/FLU/RSV plus assay is intended as an aid in the diagnosis of influenza from Nasopharyngeal swab specimens and should not be used as a sole basis for treatment. Nasal washings and aspirates are unacceptable for Xpert Xpress SARS-CoV-2/FLU/RSV testing.  Fact Sheet for Patients: BloggerCourse.com  Fact Sheet for Healthcare Providers: SeriousBroker.it  This test is not yet approved or cleared by the [place] FDA and has been authorized for detection and/or diagnosis of SARS-CoV-2 by FDA under an Emergency Use Authorization (EUA). This EUA will remain in effect (meaning this test can be used) for the duration of the COVID-19 declaration under Section 564(b)(1) of the Act, 21 U.S.C. section 360bbb-3(b)(1), unless the  authorization is terminated or revoked.     Resp Syncytial Virus by PCR NEGATIVE NEGATIVE Final    Comment: (NOTE) Fact Sheet for Patients: BloggerCourse.com  Fact Sheet for Healthcare Providers: SeriousBroker.it  This test is not yet approved or cleared by the [place] FDA and has been authorized for detection and/or diagnosis of SARS-CoV-2 by FDA under an Emergency Use Authorization (EUA). This EUA will remain in effect (meaning this test can be used) for the duration of the COVID-19 declaration under Section 564(b)(1) of the Act, 21 U.S.C. section 360bbb-3(b)(1), unless the authorization is terminated or revoked.  Performed at [facility], [address], [place], [place] [address]   Culture, Respiratory w Gram Stain     Status: None   Collection Time: 05/12/23  9:17 AM   Specimen: Tracheal Aspirate; Respiratory  Result Value Ref Range Status   Specimen Description TRACHEAL ASPIRATE  Final   Special Requests NONE  Final   Gram Stain NO WBC SEEN NO ORGANISMS SEEN   Final   Culture   Final    NO GROWTH 2 DAYS Performed at [facility], [address], [place], [place] [address]    Report Status 05/14/2023 FINAL  Final  Culture, blood (Routine X 2) w Reflex to ID Panel     Status: None   Collection Time: 05/12/23  9:41 AM   Specimen: BLOOD LEFT HAND  Result Value Ref Range Status   Specimen Description BLOOD LEFT HAND  Final   Special Requests   Final    BOTTLES DRAWN AEROBIC AND ANAEROBIC Blood Culture results may not be optimal due to an inadequate volume of blood received in culture bottles   Culture   Final    NO GROWTH 5 DAYS Performed at [facility], [address] [place], [place] [address]    Report Status 05/17/2023 FINAL  Final  Culture, blood (Routine X 2) w Reflex to ID Panel     Status: None   Collection Time: 05/12/23  9:44 AM   Specimen: BLOOD LEFT ARM  Result Value Ref Range  Status   Specimen Description BLOOD LEFT ARM  Final   Special Requests   Final    BOTTLES DRAWN AEROBIC AND ANAEROBIC Blood Culture results may not be optimal due to an inadequate volume of blood received in culture bottles   Culture  Final    NO GROWTH 5 DAYS Performed at [facility], [address] [place], [place] [address]    Report Status 05/17/2023 FINAL  Final  MRSA Next Gen by PCR, Nasal     Status: None   Collection Time: 05/13/23  8:54 AM   Specimen: Nasal Mucosa; Nasal Swab  Result Value Ref Range Status   MRSA by PCR Next Gen NOT DETECTED NOT DETECTED Final    Comment: (NOTE) The GeneXpert MRSA Assay (FDA approved for NASAL specimens only), is one component of a comprehensive MRSA colonization surveillance program. It is not intended to diagnose MRSA infection nor to guide or monitor treatment for MRSA infections. Test performance is not FDA approved in patients less than 42 years old. Performed at [facility], [address] [place], [place] [address]   Culture, Respiratory w Gram Stain     Status: None   Collection Time: 05/14/23  3:59 PM   Specimen: Tracheal Aspirate; Respiratory  Result Value Ref Range Status   Specimen Description TRACHEAL ASPIRATE  Final   Special Requests NONE  Final   Gram Stain NO WBC SEEN NO ORGANISMS SEEN   Final   Culture   Final    NO GROWTH 2 DAYS Performed at [facility], [address], [place], [place] [address]    Report Status 05/17/2023 FINAL  Final    Radiology Studies: No results found.    Trenesha Alcaide T. Tyniya Kuyper [facility]  If 7PM-7AM, please contact night-coverage [email or web address] 05/20/2023, 12:16 PM

## 2023-05-20 NOTE — TOC Progression Note (Addendum)
 Transition of Care [facility]) - Progression Note    Patient Details  Name: Melinda Stone MRN: [id number] Date of Birth: Jul 21, 1980  [facility] CM/SW Contact  Graves-Bigelow, Lamar Laundry, RN Phone Number: 05/20/2023, 12:57 PM  Clinical Narrative: Patient was discussed in progression rounds. Patient extubated on 05/17/23-plan for PT/OT/SLP consult. Case Manager continues to follow for transition of care needs as the patient progresses.    Expected Discharge Plan:  (TBD) Barriers to Discharge: Continued Medical Work up  Expected Discharge Plan and Services   Discharge Planning Services: CM Consult   Living arrangements for the past 2 months: Apartment  Social Determinants of Health (SDOH) Interventions SDOH Screenings   Food Insecurity: Patient Unable To Answer (05/15/2023)  Housing: Patient Unable To Answer (05/15/2023)  Transportation Needs: Patient Unable To Answer (05/15/2023)  Utilities: Patient Unable To Answer (05/15/2023)  Depression (PHQ2-9): High Risk (05/30/2022)  Social Connections: Socially Isolated (03/26/2023)  Tobacco Use: High Risk (05/11/2023)   Readmission Risk Interventions    03/27/2023   11:25 AM  Readmission Risk Prevention Plan  Transportation Screening Complete  Medication Review (RN Care Manager) Referral to Pharmacy  PCP or Specialist appointment within 3-5 days of discharge Complete  HRI or Home Care Consult Not Complete  HRI or Home Care Consult Pt Refusal Comments Not applicable for [facility].  SW Recovery Care/Counseling Consult Complete  Palliative Care Screening Not Applicable  Skilled Nursing Facility Not Applicable

## 2023-05-20 NOTE — Progress Notes (Signed)

## 2023-05-20 NOTE — Progress Notes (Signed)
 Modified Barium Swallow Study  Patient Details  Name: Melinda Stone MRN: [id number] Date of Birth: [birth year]  Today's Date: 05/20/2023  Modified Barium Swallow completed.  Full report located under Chart Review in the Imaging Section.  History of Present Illness Melinda Stone is a 43 yo female presenting to ED 2/22 with complaints of nausea, vomiting, abdominal pain, and myalgias. Found to be Influenza A positive. CTA showed R predominant consolidative and nodular opacities. ETT 2/23-2/28. 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 Boluses progress posteriorly to the level of the pyriform sinuses prior to swallow initiation, which intermittently allows thin liquids to enter her laryngeal vestibule. At times, they are immediately ejected by complete laryngeal elevation but other times, they progress quickly below the level of the vocal folds without being sensed (PAS 8). No further penetration or aspiration was observed with subsequent trials of nectar and honey thick liquids, purees, or solids. She initiates efficient mastication with complete oral clearance. Pt does have a collection of vallecular residue with solids specifically, but a liquid wash was effective in clearing this. Recommend initiating diet of regular solids with nectar thick liquids. SLP will f/u to trial RMST and compensatory strategies to upgrade diet as clinically indicated.  DIGEST Swallow Severity Rating*  Safety: 2  Efficiency: 2  Overall Pharyngeal Swallow Severity: 2 (moderate) 1: mild; 2: moderate; 3: severe; 4: profound  *The Dynamic Imaging Grade of Swallowing Toxicity is standardized for the head and neck cancer population, however, demonstrates  promising clinical applications across populations to standardize the clinical rating of pharyngeal swallow safety and severity.  Factors that may increase risk of adverse event in presence of aspiration [name] & Clearance [name] 2021): Frail or deconditioned;Weak cough;Presence of tubes (ETT, trach, NG, etc.)  Swallow Evaluation Recommendations Recommendations: PO diet PO Diet Recommendation: Regular;Mildly thick liquids (Level 2, nectar thick) Liquid Administration via: Cup;Straw Medication Administration: Whole meds with puree Supervision: Patient able to self-feed;Full supervision/cueing for swallowing strategies Swallowing strategies  : Minimize environmental distractions;Slow rate;Small bites/sips Postural changes: Position pt fully upright for meals Oral care recommendations: Oral care BID (2x/day) Caregiver Recommendations: Avoid jello, ice cream, thin soups, popsicles;Remove water pitcher      [name], M.A., CF-SLP Speech Language Pathology, Acute Rehabilitation Services  Secure Chat preferred [phone]  05/20/2023,3:12 PM

## 2023-05-21 ENCOUNTER — Encounter: Payer: Self-pay | Admitting: Oncology

## 2023-05-21 ENCOUNTER — Inpatient Hospital Stay: Payer: 59 | Attending: Oncology

## 2023-05-21 DIAGNOSIS — I951 Orthostatic hypotension: Secondary | ICD-10-CM

## 2023-05-21 DIAGNOSIS — J189 Pneumonia, unspecified organism: Secondary | ICD-10-CM

## 2023-05-21 DIAGNOSIS — E538 Deficiency of other specified B group vitamins: Secondary | ICD-10-CM | POA: Insufficient documentation

## 2023-05-21 DIAGNOSIS — R5381 Other malaise: Secondary | ICD-10-CM | POA: Diagnosis not present

## 2023-05-21 DIAGNOSIS — J9601 Acute respiratory failure with hypoxia: Secondary | ICD-10-CM

## 2023-05-21 DIAGNOSIS — J101 Influenza due to other identified influenza virus with other respiratory manifestations: Secondary | ICD-10-CM | POA: Diagnosis not present

## 2023-05-21 DIAGNOSIS — R739 Hyperglycemia, unspecified: Secondary | ICD-10-CM

## 2023-05-21 DIAGNOSIS — E11649 Type 2 diabetes mellitus with hypoglycemia without coma: Secondary | ICD-10-CM

## 2023-05-21 DIAGNOSIS — R160 Hepatomegaly, not elsewhere classified: Secondary | ICD-10-CM | POA: Insufficient documentation

## 2023-05-21 DIAGNOSIS — F17290 Nicotine dependence, other tobacco product, uncomplicated: Secondary | ICD-10-CM | POA: Insufficient documentation

## 2023-05-21 DIAGNOSIS — D509 Iron deficiency anemia, unspecified: Secondary | ICD-10-CM | POA: Insufficient documentation

## 2023-05-21 LAB — RENAL FUNCTION PANEL
Albumin: 2.3 g/dL — ABNORMAL LOW (ref 3.5–5.0)
Anion gap: 9 (ref 5–15)
BUN: 62 mg/dL — ABNORMAL HIGH (ref 6–20)
CO2: 25 mmol/L (ref 22–32)
Calcium: 8.2 mg/dL — ABNORMAL LOW (ref 8.9–10.3)
Chloride: 110 mmol/L (ref 98–111)
Creatinine, Ser: 1.47 mg/dL — ABNORMAL HIGH (ref 0.44–1.00)
GFR, Estimated: 46 mL/min — ABNORMAL LOW (ref >60)
Glucose, Bld: 60 mg/dL — ABNORMAL LOW (ref 70–99)
Phosphorus: 4.9 mg/dL — ABNORMAL HIGH (ref 2.5–4.6)
Potassium: 3.3 mmol/L — ABNORMAL LOW (ref 3.5–5.1)
Sodium: 144 mmol/L (ref 135–145)

## 2023-05-21 LAB — GLUCOSE, CAPILLARY
Glucose-Capillary: 100 mg/dL — ABNORMAL HIGH (ref 70–99)
Glucose-Capillary: 106 mg/dL — ABNORMAL HIGH (ref 70–99)
Glucose-Capillary: 108 mg/dL — ABNORMAL HIGH (ref 70–99)
Glucose-Capillary: 122 mg/dL — ABNORMAL HIGH (ref 70–99)
Glucose-Capillary: 140 mg/dL — ABNORMAL HIGH (ref 70–99)
Glucose-Capillary: 207 mg/dL — ABNORMAL HIGH (ref 70–99)
Glucose-Capillary: 56 mg/dL — ABNORMAL LOW (ref 70–99)

## 2023-05-21 LAB — CBC
HCT: 24.4 % — ABNORMAL LOW (ref 36.0–46.0)
Hemoglobin: 7.8 g/dL — ABNORMAL LOW (ref 12.0–15.0)
MCH: 26.5 pg (ref 26.0–34.0)
MCHC: 32 g/dL (ref 30.0–36.0)
MCV: 83 fL (ref 80.0–100.0)
Platelets: 304 10*3/uL (ref 150–400)
RBC: 2.94 MIL/uL — ABNORMAL LOW (ref 3.87–5.11)
RDW: 18.3 % — ABNORMAL HIGH (ref 11.5–15.5)
WBC: 8.7 10*3/uL (ref 4.0–10.5)
nRBC: 0 % (ref 0.0–0.2)

## 2023-05-21 LAB — MAGNESIUM: Magnesium: 2 mg/dL (ref 1.7–2.4)

## 2023-05-21 MED ORDER — POTASSIUM CHLORIDE 20 MEQ PO PACK
40.0000 meq | PACK | Freq: Once | ORAL | Status: AC
  Administered 2023-05-21: 40 meq
  Filled 2023-05-21: qty 2

## 2023-05-21 MED ORDER — INSULIN ASPART 100 UNIT/ML IJ SOLN
0.0000 [IU] | Freq: Three times a day (TID) | INTRAMUSCULAR | Status: DC
  Administered 2023-05-21: 1 [IU] via SUBCUTANEOUS
  Administered 2023-05-23: 3 [IU] via SUBCUTANEOUS

## 2023-05-21 MED ORDER — INSULIN GLARGINE 100 UNIT/ML ~~LOC~~ SOLN
30.0000 [IU] | Freq: Every day | SUBCUTANEOUS | Status: DC
  Administered 2023-05-22 – 2023-05-23 (×2): 30 [IU] via SUBCUTANEOUS
  Filled 2023-05-21 (×2): qty 0.3

## 2023-05-21 MED ORDER — DEXTROSE 5 % IV SOLN: INTRAVENOUS | Status: DC

## 2023-05-21 MED ORDER — CYCLOBENZAPRINE HCL 5 MG PO TABS
5.0000 mg | ORAL_TABLET | Freq: Three times a day (TID) | ORAL | Status: DC | PRN
  Administered 2023-05-21: 5 mg via ORAL
  Filled 2023-05-21: qty 1

## 2023-05-21 MED ORDER — PROCHLORPERAZINE EDISYLATE 10 MG/2ML IJ SOLN: 10.0000 mg | Freq: Four times a day (QID) | INTRAMUSCULAR | Status: DC | PRN

## 2023-05-21 NOTE — Progress Notes (Signed)
  Inpatient Rehabilitation Admissions Coordinator   I spoke with patient by phone. We discussed goals and expectations of a possible CIR admit. I left a message for her 41 yo daughter, [name], to verify caregiver supports after any rehab venue. I await her call back before pursuing CIR admit. Patient verifies that she is disabled and her payor is [employer]. Please call me with any questions.   Ottie Glazier, RN, MSN Rehab Admissions Coordinator [phone]

## 2023-05-21 NOTE — PMR Pre-admission (Signed)
 PMR Admission Coordinator Pre-Admission Assessment  Patient: Melinda Stone is an 43 y.o., female MRN: [id number] DOB: 11/04/1980 Height: 5\' 9"  (175.3 cm) Weight: 98.1 kg          Insurance Information HMO:     PPO:      PCP:      IPA:      80/20:      OTHER:  PRIMARY: [employer] Medicare HMO/PPO      Policy#: [id number]      Subscriber: pt CM Name: Aldean Jewett      Phone#: [phone]     Fax#: [phone] Pre-Cert#: [id number] approved for 8 days  3/6 until 3/14. Updates due 3/15     Employer:  Benefits:  Phone #: [phone]     Name: 3/4 Eff. Date: 03/20/23     Deduct: $257      Out of Pocket Max: $9350      Life Max: none  CIR: $1690 co pay per admit      SNF: 100% Outpatient: 80%     Co-Pay: 20% Home Health: 80%      Co-Pay: 20% DME: 80%     Co-Pay: 20% Providers: in network  SECONDARY: Medicaid of [place]     Policy#: [id number]      Phone#: verified by passport one source online 3/5 active COEMAFCN  Financial Counselor:       Phone#:   The Engineer, materials Information Summary" for patients in Inpatient Rehabilitation Facilities with attached "Privacy Act Statement-Health Care Records" was provided and verbally reviewed with: Patient  Emergency Contact Information Contact Information     Name Relation Home Work Mobile   McDuffie,Kenlaidia Daughter   [phone]   Emmilyn, Crooke   [phone]      Other Contacts     Name Relation Home Work Mobile   [place] Sister [phone]  [phone]   Laurian, Edrington   [phone]      Current Medical History  Patient Admitting Diagnosis: Debility related to FLU, PNA  History of Present Illness: 43 yo female with history of chronic pain, tobacco abuse, 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 Presented on 05/11/23 with  nausea, vomiting and myalgias.Exposure to son with influenza. Patient found to be have influenza and pneumonia on imaging.  Began antibiotics and developed shortness of breath. On 05/12/23 developed worsening hypoxemia requiring intubation and admitted to the ICU. Required BIPAP for a few days postextubation after 05/17/23.  Developed ICU delirium and oropharyngeal dysphagia requiring NGT feeding. Cortrak removed on 3/4. Has completed course of steroids, Tamiflu and antibiotics. Has required 4 units of PRBC felt likely secondary to acute illness in setting of known iron deficiency anemia.   Continue percocet and gabapentin and amitriptyline for chronic pain and mood disorder.Heparin SQ for DVT prophylaxis.    Patient's medical record from [facility] has been reviewed by the rehabilitation admission coordinator and physician.  Past Medical History  Past Medical History:  Diagnosis Date   Anemia    Arthritis 08/17/2020   Chronic back pain    Diabetes mellitus without complication (HCC)    type 2   GERD (gastroesophageal reflux disease)    Migraine    Ovarian cyst    Pneumonia    Polyneuropathy    Has the patient had major surgery during 100 days prior to admission? No  Family History  family history  includes Diabetes in her mother; Hypertension in her mother; Stroke in her mother.  Current Medications   Current Facility-Administered Medications:    acetaminophen (TYLENOL) suppository 650 mg, 650 mg, Rectal, Q4H PRN, Assaker, [place] Bali, MD, 650 mg at 05/13/23 2347   acetaminophen (TYLENOL) tablet 650 mg, 650 mg, Oral, Q6H PRN, Hammons, Kimberly B, RPH   amitriptyline (ELAVIL) tablet 25 mg, 25 mg, Oral, QHS, Ghimire, Kuber, MD, 25 mg at 05/22/23 2248   arformoterol (BROVANA) nebulizer solution 15 mcg, 15 mcg, Nebulization, BID, Steffanie Dunn, DO, 15 mcg at 05/23/23 0852   budesonide (PULMICORT) nebulizer solution 0.5 mg, 0.5 mg, Nebulization, BID, Steffanie Dunn, DO, 0.5 mg at  05/23/23 1610   Chlorhexidine Gluconate Cloth 2 % PADS 6 each, 6 each, Topical, Daily, Assaker, [place] Bali, MD, 6 each at 05/22/23 1002   cyclobenzaprine (FLEXERIL) tablet 5 mg, 5 mg, Oral, TID PRN, Maretta Bees, MD, 5 mg at 05/21/23 1300   docusate sodium (COLACE) capsule 100 mg, 100 mg, Oral, BID, Hammons, Kimberly B, RPH, 100 mg at 05/22/23 2249   gabapentin (NEURONTIN) capsule 400 mg, 400 mg, Oral, Q8H, Arabella Merles, RPH, 400 mg at 05/23/23 0536   heparin injection 5,000 Units, 5,000 Units, Subcutaneous, Q8H, Rochel Brome C, NP, 5,000 Units at 05/23/23 0536   hydrALAZINE (APRESOLINE) injection 10 mg, 10 mg, Intravenous, Q6H PRN, Gertha Calkin, MD, 10 mg at 05/18/23 0411   insulin aspart (novoLOG) injection 0-9 Units, 0-9 Units, Subcutaneous, TID WC, Ghimire, Werner Lean, MD, 1 Units at 05/21/23 1300   insulin glargine (LANTUS) injection 30 Units, 30 Units, Subcutaneous, Daily, Ghimire, Werner Lean, MD, 30 Units at 05/22/23 1001   metoprolol tartrate (LOPRESSOR) tablet 25 mg, 25 mg, Oral, BID, Hammons, Kimberly B, RPH, 25 mg at 05/22/23 2248   multivitamin with minerals tablet 1 tablet, 1 tablet, Oral, Daily, Hammons, Kimberly B, RPH, 1 tablet at 05/22/23 1001   ondansetron (ZOFRAN) injection 4 mg, 4 mg, Intravenous, Q6H PRN, Cheri Fowler, MD, 4 mg at 05/21/23 0920   oxyCODONE (Oxy IR/ROXICODONE) immediate release tablet 10 mg, 10 mg, Oral, Q6H PRN, Hammons, Kimberly B, RPH   pantoprazole (PROTONIX) EC tablet 40 mg, 40 mg, Oral, Daily, Hammons, Kimberly B, RPH, 40 mg at 05/22/23 1001   polyethylene glycol (MIRALAX / GLYCOLAX) packet 17 g, 17 g, Oral, Daily, Hammons, Kimberly B, RPH   polyvinyl alcohol (LIQUIFILM TEARS) 1.4 % ophthalmic solution 1 drop, 1 drop, Both Eyes, PRN, Rochel Brome C, NP, 1 drop at 05/18/23 1441   prochlorperazine (COMPAZINE) injection 10 mg, 10 mg, Intravenous, Q6H PRN, Sundil, Subrina, MD   revefenacin (YUPELRI) nebulizer solution 175 mcg, 175 mcg, Nebulization,  Daily, Steffanie Dunn, DO, 175 mcg at 05/23/23 9604   sodium chloride flush (NS) 0.9 % injection 3 mL, 3 mL, Intravenous, Q12H, Irena Cords V, MD, 3 mL at 05/22/23 2249   vitamin B-12 (CYANOCOBALAMIN) tablet 100 mcg, 100 mcg, Oral, Daily, Hammons, Kimberly B, RPH, 100 mcg at 05/22/23 1001  Patients Current Diet:  Diet Order             Diet Carb Modified Fluid consistency: Thin; Room service appropriate? Yes  Diet effective now           Diet Carb Modified                  Precautions / Restrictions Precautions Precautions: Fall Restrictions Weight Bearing Restrictions Per Provider Order: No   Has the patient had 2 or more  falls or a fall with injury in the past year?No  Prior Activity Level Community (5-7x/wk): independent without AD, does not drive  Prior Functional Level Prior Function Prior Level of Function : Independent/Modified Independent Mobility Comments: reports not using AD, doesn't drive, children do her grocery shopping ADLs Comments: mod I  Self Care: Did the patient need help bathing, dressing, using the toilet or eating?  Independent  Indoor Mobility: Did the patient need assistance with walking from room to room (with or without device)? Independent  Stairs: Did the patient need assistance with internal or external stairs (with or without device)? Independent  Functional Cognition: Did the patient need help planning regular tasks such as shopping or remembering to take medications? Independent  Patient Information Are you of Hispanic, Latino/a,or Spanish origin?: A. No, not of Hispanic, Latino/a, or Spanish origin What is your race?: B. Black or African American Do you need or want an interpreter to communicate with a doctor or health care staff?: 0. No  Patient's Response To:  Health Literacy and Transportation Is the patient able to respond to health literacy and transportation needs?: Yes Health Literacy - How often do you need to have someone  help you when you read instructions, pamphlets, or other written material from your doctor or pharmacy?: Never In the past 12 months, has lack of transportation kept you from medical appointments or from getting medications?: No In the past 12 months, has lack of transportation kept you from meetings, work, or from getting things needed for daily living?: No  Home Assistive Devices / Equipment Home Equipment: None  Prior Device Use: Indicate devices/aids used by the patient prior to current illness, exacerbation or injury? None of the above  Current Functional Level Cognition  Orientation Level: Oriented X4    Extremity Assessment (includes Sensation/Coordination)  Upper Extremity Assessment: Generalized weakness  Lower Extremity Assessment: Generalized weakness    ADLs  Overall ADL's : Needs assistance/impaired Eating/Feeding: Set up Grooming: Set up, Wash/dry face, Standing Upper Body Bathing: Minimal assistance, Sitting Lower Body Bathing: Maximal assistance Upper Body Dressing : Minimal assistance, Sitting Upper Body Dressing Details (indicate cue type and reason): donning gown on backside Lower Body Dressing: Maximal assistance Toilet Transfer: Contact guard assist, Ambulation, Regular Toilet, Rolling walker (2 wheels) Toileting- Clothing Manipulation and Hygiene: Moderate assistance Functional mobility during ADLs: Contact guard assist, Rolling walker (2 wheels)    Mobility  Overal bed mobility: Needs Assistance Bed Mobility: Supine to Sit, Sit to Supine Supine to sit: Mod assist Sit to supine: Mod assist General bed mobility comments: In recliner    Transfers  Overall transfer level: Needs assistance Equipment used: Rolling walker (2 wheels) Transfers: Sit to/from Stand Sit to Stand: Min assist Bed to/from chair/wheelchair/BSC transfer type:: Step pivot Step pivot transfers: Mod assist General transfer comment: min assist for boost to stand x2 today, reduced  control with descent. Cues for hand placement, slow and effortful to rise.    Ambulation / Gait / Stairs / Wheelchair Mobility  Ambulation/Gait General Gait Details: deferred due to hypotension and lethargy +dizziness    Posture / Balance Balance Overall balance assessment: Needs assistance Sitting-balance support: Feet supported, No upper extremity supported Sitting balance-Leahy Scale: Good Standing balance support: Bilateral upper extremity supported, During functional activity, Reliant on assistive device for balance Standing balance-Leahy Scale: Poor Standing balance comment: dependent on external support    Special needs/care consideration Droplet precautions     Previous Living Setting Living Arrangements:  (lives with children, 15  yom 69 yo and 67 yo dtr next door)  Lives With: Family Available Help at Discharge:  (reports 40 yo dtr can provide 24/7) Type of Home: Apartment Home Layout: One level Home Access: Level entry Bathroom Shower/Tub: Engineer, manufacturing systems: Standard Bathroom Accessibility: Yes How Accessible: Accessible via walker Home Care Services: No  43 yo [place] lives across the street in same apartment complex. Does not work or go to school  Discharge Living Setting Plans for Discharge Living Setting: Patient's home, Lives with (comment), Apartment (17 and 66 yo children) Type of Home at Discharge: Apartment Discharge Home Layout: One level Discharge Home Access: Level entry Discharge Bathroom Shower/Tub: Tub/shower unit Discharge Bathroom Toilet: Standard Discharge Bathroom Accessibility: Yes How Accessible: Accessible via walker Does the patient have any problems obtaining your medications?: No  Social/Family/Support Systems Patient Roles: Parent Contact Information: Archivist Anticipated Caregiver: children Anticipated Caregiver's Contact Information: see contacts Caregiver Availability: 24/7 Discharge Plan Discussed with  Primary Caregiver: Yes Is Caregiver In Agreement with Plan?: Yes Does Caregiver/Family have Issues with Lodging/Transportation while Pt is in Rehab?: No  Goals Patient/Family Goal for Rehab: Mod I to supervision with PT, OT and SLP Expected length of stay: ELOS 10 to 12 Pt/Family Agrees to Admission and willing to participate: Yes Program Orientation Provided & Reviewed with Pt/Caregiver Including Roles  & Responsibilities: Yes  Decrease burden of Care through IP rehab admission: n/a  Possible need for SNF placement upon discharge:not anticipated  Patient Condition: This patient's condition remains as documented in the consult dated 05/21/23, in which the Rehabilitation Physician determined and documented that the patient's condition is appropriate for intensive rehabilitative care in an inpatient rehabilitation facility. Will admit to inpatient rehab today.  Preadmission Screen Completed By:  Clois Dupes, RN MSN 05/23/2023 10:16 AM ______________________________________________________________________   Discussed status with Dr. Natale Lay on 05/23/23 at [phone] and received approval for admission today.  Admission Coordinator:  Clois Dupes, RN MSN time 0981 Date 05/23/23

## 2023-05-21 NOTE — Progress Notes (Addendum)
  Hypoglycemia at 4:50 AM History of insulin-dependent DM type II - Patient refusing tube feeding due to nausea.  Patient has been plan for Cortrack placement tomorrow.  Patient having very poor oral intake. -Per chart review history of DM type II currently Lantus dose has been adjusted 30 unit with holding parameters. - Due to persistent hypoglycemia, poor oral intake and refusing tube feeding starting D5% solution 40 cc/h.  Need to discontinue the D5 fluid once Cortate will be placed and diet will be resumed. -Continue check POC blood glucose every 4 hours with sliding scale SSI.  If patient becomes hyperglycemic in that case need to DC the dextrose maintenance fluid.   Tereasa Coop, MD [facility] 05/21/2023, 4:50 AM

## 2023-05-21 NOTE — Progress Notes (Deleted)
 Physical Medicine and Rehabilitation Consult Reason for Consult: Rehab Referring Physician: Dr. Jerral Ralph   HPI: Melinda Stone is a 43 y.o. female with past medical history of chronic back pain, tobacco abuse, lumbar spondylosis with prior back surgery, obesity, history of B12 deficiency, history of diabetes mellitus type 2, diabetic retinopathy, GI bleed, diastolic dysfunction, migraines, mood disorder who presented with fever, chills and myalgias.  She was found to have influenza A, CTA of the chest showed pneumonia.  She developed worsening hypoxemia transferred to the ICU and was intubated on 2/23.  On 2/28 she was extubated requiring BiPAP.  She had a core track placed that was removed on 3/4.  Echo with EF 65 to 70%, grade 1 diastolic dysfunction.  She had acute metabolic encephalopathy, now much improved.  Treated for AKI likely due to ischemic ATN, improving.  Reports some dizziness with therapy today.  She has been upgraded to carb modified diet, nectar thick liquids she has been evaluated by PT, OT and SLP found to have functional deficits that would benefit from CIR.  She lives in a [place] apartment with one-step to enter.  She has several children 34 who is 33 years old, 3 who are 40 years old-adopted and a 42 year old daughter who lives right beside her and can provide 24-hour assistance.   Review of Systems  Constitutional:  Positive for malaise/fatigue. Negative for chills and fever.  HENT:  Negative for hearing loss.   Eyes:  Negative for blurred vision and double vision.  Respiratory:  Positive for shortness of breath.   Cardiovascular:  Negative for chest pain.  Gastrointestinal:  Positive for constipation and diarrhea. Negative for abdominal pain.  Genitourinary: Negative.   Musculoskeletal:  Positive for back pain.  Skin:  Negative for rash.  Neurological:  Positive for weakness and headaches. Negative for sensory change.  Psychiatric/Behavioral:  Negative  for depression.    Past Medical History:  Diagnosis Date   Anemia    Arthritis 08/17/2020   Chronic back pain    Diabetes mellitus without complication (HCC)    type 2   GERD (gastroesophageal reflux disease)    Migraine    Ovarian cyst    Pneumonia    Polyneuropathy    Past Surgical History:  Procedure Laterality Date   CESAREAN SECTION     CHOLECYSTECTOMY  2004   LUMBAR LAMINECTOMY N/A 05/30/2020   Procedure: Lumbar five Laminectomy,  Bilateral Microdiscectomy, Left Lumbar five -Sacral one Microdiscectomy;  Surgeon: [name], MD;  Location: [facility] OR;  Service: Orthopedics;  Laterality: N/A;   LUMBAR LAMINECTOMY Left 11/14/2020   Procedure: LEFT LUMBAR FOUR-FIVE MICRODISCECTOMY;  Surgeon: [name], MD;  Location: [facility] OR;  Service: Orthopedics;  Laterality: Left;   TUBAL LIGATION  10/19/2010   Family History  Problem Relation Age of Onset   Diabetes Mother    Hypertension Mother    Stroke Mother    Social History:  reports that she has been smoking cigars. She has never used smokeless tobacco. She reports that she does not currently use alcohol. She reports that she does not currently use drugs. Allergies:  Allergies  Allergen Reactions   Trulicity [Dulaglutide] Diarrhea   Medications Prior to Admission  Medication Sig Dispense Refill   acetaminophen (TYLENOL) 500 MG tablet Take 500 mg by mouth every 6 (six) hours as needed for mild pain or moderate pain.     amitriptyline (ELAVIL) 25 MG tablet Take 1 tablet (25 mg total)  by mouth at bedtime. 30 tablet 6   celecoxib (CELEBREX) 100 MG capsule Take 100 mg by mouth 2 (two) times daily as needed for mild pain (pain score 1-3) (take with food).     cholecalciferol (VITAMIN D3) 25 MCG (1000 UNIT) tablet Take 1,000 Units by mouth daily.     Cyanocobalamin (VITAMIN B-12) 2500 MCG SUBL Place 1 tablet (2,500 mcg total) under the tongue daily. 30 tablet 2   furosemide (LASIX) 20 MG tablet Take 1 tablet (20 mg total) by mouth  daily. 6 tablet 0   gabapentin (NEURONTIN) 800 MG tablet Take 800 mg by mouth 3 (three) times daily.     glipiZIDE (GLUCOTROL) 10 MG tablet Take 10-20 mg by mouth See admin instructions. Take 20 mg by mouth before breakfast and 10 mg before supper     insulin aspart (NOVOLOG) 100 UNIT/ML FlexPen Before each meal 3 times a day, 140-199 - 2 units, 200-250 - 4 units, 251-299 - 8 units,  300-349 - 10 units,  350 or above 12 units. 15 mL 0   insulin glargine (LANTUS SOLOSTAR) 100 UNIT/ML Solostar Pen Inject 30 Units into the skin at bedtime. 9 mL 0   methocarbamol (ROBAXIN) 500 MG tablet Take 1 tablet (500 mg total) by mouth 2 (two) times daily. (Patient taking differently: Take 500 mg by mouth at bedtime.) 20 tablet 0   metoprolol tartrate (LOPRESSOR) 25 MG tablet Take 1 tablet (25 mg total) by mouth 2 (two) times daily. 60 tablet 0   ondansetron (ZOFRAN-ODT) 4 MG disintegrating tablet Take 1 tablet (4 mg total) by mouth every 6 (six) hours as needed for nausea or vomiting. 20 tablet 0   oxyCODONE-acetaminophen (PERCOCET/ROXICET) 5-325 MG tablet Take 1 tablet by mouth every 6 (six) hours as needed for moderate pain or severe pain.     tiZANidine (ZANAFLEX) 4 MG tablet Take 4 mg by mouth every 12 (twelve) hours as needed for muscle spasms (take with food).     Ubrogepant (UBRELVY) 100 MG TABS Take 1 tablet (100 mg total) by mouth as needed. Take 1 tablet at onset of headache, may repeat in 2 hours if needed. Max is 200 mg in 24 hours. 12 tablet 11   vitamin C (ASCORBIC ACID) 250 MG tablet Take 2 tablets (500 mg total) by mouth daily. (Patient taking differently: Take 250 mg by mouth daily.) 180 tablet 0    Home: Home Living Family/patient expects to be discharged to:: Private residence Living Arrangements: Children Available Help at Discharge: Family, Available PRN/intermittently Type of Home: Apartment Home Access: Level entry Home Layout: One level Bathroom Shower/Tub: Agricultural engineer: Standard Home Equipment: None  Functional History: Prior Function Prior Level of Function : Independent/Modified Independent Mobility Comments: reports not using AD, doesn't drive, children do her grocery shopping ADLs Comments: mod I Functional Status:  Mobility: Bed Mobility Overal bed mobility: Needs Assistance Bed Mobility: Supine to Sit, Sit to Supine Supine to sit: Mod assist Sit to supine: Mod assist General bed mobility comments: In recliner Transfers Overall transfer level: Needs assistance Equipment used: Rolling walker (2 wheels) Transfers: Sit to/from Stand Sit to Stand: Min assist Bed to/from chair/wheelchair/BSC transfer type:: Step pivot Step pivot transfers: Mod assist General transfer comment: min assist for boost to stand x2 today, reduced control with descent. Cues for hand placement, slow and effortful to rise. Ambulation/Gait General Gait Details: deferred due to hypotension and lethargy +dizziness    ADL: ADL Overall ADL's : Needs assistance/impaired  Eating/Feeding: Set up Grooming: Minimal assistance Upper Body Bathing: Minimal assistance, Sitting Lower Body Bathing: Maximal assistance Upper Body Dressing : Minimal assistance Lower Body Dressing: Maximal assistance Toilet Transfer: Moderate assistance, Stand-pivot, BSC/3in1, Rolling walker (2 wheels) Toileting- Clothing Manipulation and Hygiene: Moderate assistance Functional mobility during ADLs: Moderate assistance, Rolling walker (2 wheels)  Cognition: Cognition Orientation Level: Oriented X4 Cognition Arousal: Lethargic, Suspect due to medications Behavior During Therapy: Flat affect  Blood pressure 109/69, pulse 84, temperature 97.9 F (36.6 C), temperature source Oral, resp. rate 15, height 5\' 9"  (1.753 m), weight 95.3 kg, last menstrual period 04/01/2023, SpO2 95%. Physical Exam  General: No apparent distress, eating lunch HEENT: Head is normocephalic, atraumatic, sclera  anicteric, oral mucosa pink and moist, decrease dentition Neck: Supple without JVD or lymphadenopathy Heart: Reg rate and rhythm. No murmurs rubs or gallops Chest: CTA bilaterally without wheezes, rales, or rhonchi; no distress Abdomen: Soft, non-tender, non-distended, bowel sounds positive. Extremities: No clubbing, cyanosis, or edema. Pulses are 2+ Psych: Pt's affect is appropriate. Pt is cooperative Skin: Clean and intact without signs of breakdown Neuro:    Mental Status: AAOx3, memory grossly intact, attention deficits present, delayed responses Speech/Languate: Naming and repetition intact, fluent, follows simple commands CRANIAL NERVES: 2 through 12 grossly intact  MOTOR: RUE: 4/5 Deltoid, 4/5 Biceps, 4/5 Triceps,4/5 Grip LUE: 4/5 Deltoid, 4/5 Biceps, 4/5 Triceps, 4/5 Grip RLE: HF 4/5, KE 4/5, ADF 4/5, APF 4/5 LLE: HF 4/5, KE 4/5, ADF 4/5, APF 4/5  SENSORY: Normal to touch all 4 extremities     Results for orders placed or performed during the hospital encounter of 05/11/23 (from the past 24 hours)  Glucose, capillary     Status: Abnormal   Collection Time: 05/20/23  1:12 PM  Result Value Ref Range   Glucose-Capillary 214 (H) 70 - 99 mg/dL   Comment 1 Document in Chart   Glucose, capillary     Status: Abnormal   Collection Time: 05/20/23  3:51 PM  Result Value Ref Range   Glucose-Capillary 197 (H) 70 - 99 mg/dL  Glucose, capillary     Status: Abnormal   Collection Time: 05/20/23  8:00 PM  Result Value Ref Range   Glucose-Capillary 150 (H) 70 - 99 mg/dL  Glucose, capillary     Status: Abnormal   Collection Time: 05/20/23 11:10 PM  Result Value Ref Range   Glucose-Capillary 155 (H) 70 - 99 mg/dL  Glucose, capillary     Status: Abnormal   Collection Time: 05/21/23  4:32 AM  Result Value Ref Range   Glucose-Capillary 56 (L) 70 - 99 mg/dL  Renal function panel     Status: Abnormal   Collection Time: 05/21/23  4:40 AM  Result Value Ref Range   Sodium 144 135 - 145  mmol/L   Potassium 3.3 (L) 3.5 - 5.1 mmol/L   Chloride 110 98 - 111 mmol/L   CO2 25 22 - 32 mmol/L   Glucose, Bld 60 (L) 70 - 99 mg/dL   BUN 62 (H) 6 - 20 mg/dL   Creatinine, Ser 4.78 (H) 0.44 - 1.00 mg/dL   Calcium 8.2 (L) 8.9 - 10.3 mg/dL   Phosphorus 4.9 (H) 2.5 - 4.6 mg/dL   Albumin 2.3 (L) 3.5 - 5.0 g/dL   GFR, Estimated 46 (L) >60 mL/min   Anion gap 9 5 - 15  Magnesium     Status: None   Collection Time: 05/21/23  4:40 AM  Result Value Ref Range   Magnesium 2.0  1.7 - 2.4 mg/dL  Glucose, capillary     Status: Abnormal   Collection Time: 05/21/23  5:20 AM  Result Value Ref Range   Glucose-Capillary 106 (H) 70 - 99 mg/dL  Glucose, capillary     Status: Abnormal   Collection Time: 05/21/23  8:39 AM  Result Value Ref Range   Glucose-Capillary 207 (H) 70 - 99 mg/dL  CBC     Status: Abnormal   Collection Time: 05/21/23  9:09 AM  Result Value Ref Range   WBC 8.7 4.0 - 10.5 K/uL   RBC 2.94 (L) 3.87 - 5.11 MIL/uL   Hemoglobin 7.8 (L) 12.0 - 15.0 g/dL   HCT 13.0 (L) 86.5 - 78.4 %   MCV 83.0 80.0 - 100.0 fL   MCH 26.5 26.0 - 34.0 pg   MCHC 32.0 30.0 - 36.0 g/dL   RDW 69.6 (H) 29.5 - 28.4 %   Platelets 304 150 - 400 K/uL   nRBC 0.0 0.0 - 0.2 %  Glucose, capillary     Status: Abnormal   Collection Time: 05/21/23 12:49 PM  Result Value Ref Range   Glucose-Capillary 122 (H) 70 - 99 mg/dL   DG Swallowing Func-Speech Pathology Result Date: 05/20/2023 Table formatting from the original result was not included. Modified Barium Swallow Study Patient Details Name: ENEZ MONAHAN MRN: [id number] Date of Birth: Feb 05, 1981 Today's Date: 05/20/2023 HPI/PMH: HPI: JAKYAH BRADBY is a 43 yo female presenting to ED 2/22 with complaints of nausea, vomiting, abdominal pain, and myalgias. Found to be Influenza A positive. CTA showed R predominant consolidative and nodular opacities. ETT 2/23-2/28. 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 Boluses progress posteriorly to the level of the pyriform sinuses prior to swallow initiation, which intermittently allows thin liquids to enter her laryngeal vestibule. At times, they are immediately ejected by complete laryngeal elevation but other times, they progress quickly below the level of the vocal folds without being sensed (PAS 8). No further penetration or aspiration was observed with subsequent trials of nectar and honey thick liquids, purees, or solids. She initiates efficient mastication with complete oral clearance. Pt does have a collection of vallecular residue with solids specifically, but a liquid wash was effective in clearing this. Recommend initiating diet of regular solids with nectar thick liquids. SLP will f/u to trial RMST and compensatory strategies to upgrade diet as clinically indicated. DIGEST Swallow Severity Rating*  Safety: 2  Efficiency: 2  Overall Pharyngeal Swallow Severity: 2 (moderate) 1: mild; 2: moderate; 3: severe; 4: profound *The Dynamic Imaging Grade of Swallowing Toxicity is standardized for the head and neck cancer population, however, demonstrates promising clinical applications across populations to standardize the clinical rating of pharyngeal swallow safety and severity. Factors that may increase risk of adverse event in presence of aspiration [name] & Clearance [name] 2021): Factors that may increase risk of adverse event in presence of aspiration [name] & Clearance [name] 2021): Frail or deconditioned; Weak cough; Presence of tubes (ETT, trach, NG, etc.) Recommendations/Plan: Swallowing Evaluation Recommendations Swallowing Evaluation Recommendations Recommendations: PO diet PO Diet Recommendation: Regular; Mildly thick liquids (Level 2, nectar thick)  Liquid Administration via: Cup; Straw Medication Administration: Whole meds with puree Supervision: Patient able to self-feed; Full supervision/cueing for swallowing strategies Swallowing strategies  : Minimize environmental distractions; Slow  rate; Small bites/sips Postural changes: Position pt fully upright for meals Oral care recommendations: Oral care BID (2x/day) Caregiver Recommendations: Avoid jello, ice cream, thin soups, popsicles; Remove water pitcher Treatment Plan Treatment Plan Treatment recommendations: Therapy as outlined in treatment plan below Follow-up recommendations: Acute inpatient rehab (3 hours/day) Functional status assessment: Patient has had a recent decline in their functional status and demonstrates the ability to make significant improvements in function in a reasonable and predictable amount of time. Treatment frequency: Min 2x/week Treatment duration: 2 weeks Interventions: Aspiration precaution training; Compensatory techniques; Patient/family education; Trials of upgraded texture/liquids; Diet toleration management by SLP; Respiratory muscle strength training Recommendations Recommendations for follow up therapy are one component of a multi-disciplinary discharge planning process, led by the attending physician.  Recommendations may be updated based on patient status, additional functional criteria and insurance authorization. Assessment: Orofacial Exam: Orofacial Exam Oral Cavity: Oral Hygiene: WFL Oral Cavity - Dentition: Adequate natural dentition Orofacial Anatomy: WFL Oral Motor/Sensory Function: WFL Anatomy: Anatomy: Suspected cervical osteophytes Boluses Administered: Boluses Administered Boluses Administered: Thin liquids (Level 0); Mildly thick liquids (Level 2, nectar thick); Moderately thick liquids (Level 3, honey thick); Puree; Solid  Oral Impairment Domain: Oral Impairment Domain Lip Closure: No labial escape Tongue control during bolus hold: Posterior escape of less  than half of bolus Bolus preparation/mastication: Timely and efficient chewing and mashing Bolus transport/lingual motion: Brisk tongue motion Oral residue: Complete oral clearance Location of oral residue : N/A Initiation of pharyngeal swallow : Pyriform sinuses  Pharyngeal Impairment Domain: Pharyngeal Impairment Domain Soft palate elevation: No bolus between soft palate (SP)/pharyngeal wall (PW) Laryngeal elevation: Complete superior movement of thyroid cartilage with complete approximation of arytenoids to epiglottic petiole Anterior hyoid excursion: Complete anterior movement Epiglottic movement: Complete inversion Laryngeal vestibule closure: Complete, no air/contrast in laryngeal vestibule Pharyngeal stripping wave : Present - complete Pharyngeal contraction (A/P view only): N/A Pharyngoesophageal segment opening: Complete distension and complete duration, no obstruction of flow Tongue base retraction: Trace column of contrast or air between tongue base and PPW Pharyngeal residue: Collection of residue within or on pharyngeal structures Location of pharyngeal residue: Valleculae  Esophageal Impairment Domain: No data recorded Pill: No data recorded Penetration/Aspiration Scale Score: Penetration/Aspiration Scale Score 1.  Material does not enter airway: Moderately thick liquids (Level 3, honey thick); Puree; Solid; Mildly thick liquids (Level 2, nectar thick) 8.  Material enters airway, passes BELOW cords without attempt by patient to eject out (silent aspiration) : Thin liquids (Level 0) Compensatory Strategies: Compensatory Strategies Compensatory strategies: Yes Straw: Ineffective Ineffective Straw: Thin liquid (Level 0)   General Information: Caregiver present: No  Diet Prior to this Study: NPO; Cortrak/Small bore NG tube   Temperature : Normal   Respiratory Status: WFL   Supplemental O2: Nasal cannula   History of Recent Intubation: Yes  Behavior/Cognition: Alert; Cooperative; Pleasant mood Self-Feeding  Abilities: Able to self-feed Baseline vocal quality/speech: Dysphonic; Hypophonia/low volume Volitional Cough: Able to elicit Volitional Swallow: Able to elicit Exam Limitations: No limitations Goal Planning: Prognosis for improved oropharyngeal function: Good Barriers to Reach Goals: Time post onset No data recorded Patient/Family Stated Goal: wants to eat/drink Consulted and agree with results and recommendations: Patient; Nurse Pain: Pain Assessment Pain Assessment: Faces Faces Pain Scale: 0 Facial Expression: 0 Body Movements: 0 Muscle Tension: 0 Compliance with ventilator (intubated pts.): N/A Vocalization (extubated pts.): 0 CPOT Total: 0 Pain Location: pt reports back Pain Intervention(s): Monitored during session End of Session: Start Time:SLP Start Time (ACUTE ONLY): 1335  Stop Time: SLP Stop Time (ACUTE ONLY): 1352 Time Calculation:SLP Time Calculation (min) (ACUTE ONLY): 17 min Charges: SLP Evaluations $ SLP Speech Visit: 1 Visit SLP Evaluations $BSS Swallow: 1 Procedure $MBS Swallow: 1 Procedure SLP visit diagnosis: SLP Visit Diagnosis: Dysphagia, unspecified (R13.10) Past Medical History: Past Medical History: Diagnosis Date  Anemia   Arthritis 08/17/2020  Chronic back pain   Diabetes mellitus without complication (HCC)   type 2  GERD (gastroesophageal reflux disease)   Migraine   Ovarian cyst   Pneumonia   Polyneuropathy  Past Surgical History: Past Surgical History: Procedure Laterality Date  CESAREAN SECTION    CHOLECYSTECTOMY  2004  LUMBAR LAMINECTOMY N/A 05/30/2020  Procedure: Lumbar five Laminectomy,  Bilateral Microdiscectomy, Left Lumbar five -Sacral one Microdiscectomy;  Surgeon: [name], MD;  Location: [facility] OR;  Service: Orthopedics;  Laterality: N/A;  LUMBAR LAMINECTOMY Left 11/14/2020  Procedure: LEFT LUMBAR FOUR-FIVE MICRODISCECTOMY;  Surgeon: [name], MD;  Location: [facility] OR;  Service: Orthopedics;  Laterality: Left;  TUBAL LIGATION  10/19/2010 [name], M.A., CF-SLP Speech  Language Pathology, Acute Rehabilitation Services Secure Chat preferred [phone] 05/20/2023, 3:14 PM   Assessment/Plan: Diagnosis: Debility due to hypoxic respiratory failure secondary to influenza pneumonia and asthma exacerbation Does the need for close, 24 hr/day medical supervision in concert with the patient's rehab needs make it unreasonable for this patient to be served in a less intensive setting? Yes Co-Morbidities requiring supervision/potential complications:  -Acute hypoxic respiratory failure, acute metabolic encephalopathy, AKI, hyponatremia, normocytic anemia, oropharyngeal dysphagia, hypertension, diabetes mellitus type 2, chronic pain, mood disorder, obesity Due to bladder management, bowel management, safety, skin/wound care, disease management, medication administration, pain management, and patient education, does the patient require 24 hr/day rehab nursing? Yes Does the patient require coordinated care of a physician, rehab nurse, therapy disciplines of PT, OT, SLP to address physical and functional deficits in the context of the above medical diagnosis(es)? Yes Addressing deficits in the following areas: balance, endurance, locomotion, strength, transferring, bowel/bladder control, bathing, dressing, feeding, grooming, toileting, cognition, swallowing, and psychosocial support Can the patient actively participate in an intensive therapy program of at least 3 hrs of therapy per day at least 5 days per week? Yes The potential for patient to make measurable gains while on inpatient rehab is excellent Anticipated functional outcomes upon discharge from inpatient rehab are supervision  with PT, supervision with OT, modified independent with SLP. Estimated rehab length of stay to reach the above functional goals is: 10-12 Anticipated discharge destination: Home Overall Rehab/Functional Prognosis: excellent  POST ACUTE RECOMMENDATIONS: This patient's condition is appropriate for  continued rehabilitative care in the following setting: CIR Patient has agreed to participate in recommended program. Yes Note that insurance prior authorization may be required for reimbursement for recommended care.  Comment: I think she be a good candidate for CIR, rehab coordinator to follow-up   MEDICAL RECOMMENDATIONS: Patient with orthostatic hypotension.,  Consider compression stockings, abdominal binder, encouraging hydration.  I have personally performed a face to face diagnostic evaluation of this patient. Additionally, I have examined the patient's medical record including any pertinent labs and radiographic images.    Thanks,  Fanny Dance, MD 05/21/2023

## 2023-05-21 NOTE — Progress Notes (Addendum)
 Physical Therapy Treatment Patient Details Name: Melinda Stone MRN: [id number] DOB: January 22, 1981 Today's Date: 05/21/2023   History of Present Illness 43 year old F presented to ED on 2/22 with nausea, vomiting, and abdominal pain. Pt went into acute respiratory failure with hypoxia due to flu A and asthma exacerbation requiring intubation on 2/22. Pt extubated on 2/28. PMH: mild intermittent asthma, IDDM-2, IDA, GERD, chronic pain and mood disorder    PT Comments  Agreeable to work with therapy today, limited by symptomatic hypotension after standing (see details below.) RN notified. Min assist for transfer with RW, slow and effortful. Lethargic today, states she was pre-medicated with narcotic which makes her very tired. Improved after being reclined in chair with LEs elevated. Tolerated LE exercises well. Will progress further next visit as symptoms allow. Patient will continue to benefit from skilled physical therapy services to further improve independence with functional mobility.  Pre activity BP 115/63 HR 85 SpO2 94% on RA. Developed dizziness upon standing - rechecked BP after sitting down and BP 97/59 (MAP 72) HR 85. Symptoms improved and we tried again - while standing BP 84/55 (MAP 63) HR 86. Pt placed in recliner with feet elevated - symptoms resolved BP 107/65 (MAP 76 HR 83) Spo2 93% on RA. RN notified.     If plan is discharge home, recommend the following: A lot of help with bathing/dressing/bathroom;Assist for transportation;Assistance with cooking/housework;A little help with walking and/or transfers   Can travel by private vehicle        Equipment Recommendations   (TBD at next venue)    Recommendations for Other Services Rehab consult     Precautions / Restrictions Precautions Precautions: Fall Restrictions Weight Bearing Restrictions Per Provider Order: No     Mobility  Bed Mobility               General bed mobility comments: In recliner     Transfers Overall transfer level: Needs assistance Equipment used: Rolling walker (2 wheels) Transfers: Sit to/from Stand Sit to Stand: Min assist           General transfer comment: min assist for boost to stand x2 today, reduced control with descent. Cues for hand placement, slow and effortful to rise.    Ambulation/Gait               General Gait Details: deferred due to hypotension and lethargy +dizziness   Stairs             Wheelchair Mobility     Tilt Bed    Modified Rankin (Stroke Patients Only)       Balance Overall balance assessment: Needs assistance Sitting-balance support: Feet supported, No upper extremity supported Sitting balance-Leahy Scale: Fair     Standing balance support: Bilateral upper extremity supported, During functional activity, Reliant on assistive device for balance Standing balance-Leahy Scale: Poor Standing balance comment: dependent on external support                            Communication Communication Communication:  (soft spoken) Factors Affecting Communication: Reduced clarity of speech  Cognition Arousal: Lethargic, Suspect due to medications Behavior During Therapy: Flat affect   PT - Cognitive impairments: No family/caregiver present to determine baseline                       PT - Cognition Comments: pt soft spoken, delayed response time. States she took prescribed narcotic for pain  prior to therapy session which she believes is making her tired. Following commands: Intact      Cueing Cueing Techniques: Verbal cues, Tactile cues  Exercises General Exercises - Lower Extremity Ankle Circles/Pumps: AROM, Both, 15 reps, Supine Quad Sets: Strengthening, Both, 10 reps, Supine Gluteal Sets: Strengthening, Both, 15 reps, Supine Heel Slides: Strengthening, Both, 10 reps, Supine Hip ABduction/ADduction: Strengthening, Both, 10 reps, Seated Straight Leg Raises: Strengthening, Both, 10  reps, Supine    General Comments General comments (skin integrity, edema, etc.): Pre activity BP 115/63 HR 85 SpO2 94% on RA. Developed dizziness upon standing - rechecked BP after sitting down and BP 97/59 (MAP 72) HR 85. Symptoms improved and we tried again - while standing BP 84/55 (MAP 63) HR 86. Pt placed in recliner with feet elevated - symptoms resolved BP 107/65 (MAP 76 HR 83) Spo2 93% on RA. RN notified.      Pertinent Vitals/Pain Pain Assessment Pain Assessment: No/denies pain Pain Intervention(s): Premedicated before session    Home Living Family/patient expects to be discharged to:: Private residence Living Arrangements: Children Available Help at Discharge: Family;Available PRN/intermittently Type of Home: Apartment Home Access: Level entry       Home Layout: One level Home Equipment: None      Prior Function            PT Goals (current goals can now be found in the care plan section) Acute Rehab PT Goals Patient Stated Goal: didn't state PT Goal Formulation: With patient Time For Goal Achievement: 06/03/23 Potential to Achieve Goals: Good Progress towards PT goals: Progressing toward goals    Frequency    Min 1X/week      PT Plan      Co-evaluation              AM-PAC PT "6 Clicks" Mobility   Outcome Measure  Help needed turning from your back to your side while in a flat bed without using bedrails?: A Little Help needed moving from lying on your back to sitting on the side of a flat bed without using bedrails?: A Little Help needed moving to and from a bed to a chair (including a wheelchair)?: A Little Help needed standing up from a chair using your arms (e.g., wheelchair or bedside chair)?: A Little Help needed to walk in hospital room?: A Lot Help needed climbing 3-5 steps with a railing? : Total 6 Click Score: 15    End of Session Equipment Utilized During Treatment: Gait belt Activity Tolerance: Other (comment)  (hypotensive) Patient left: with call bell/phone within reach;in chair;with chair alarm set Nurse Communication: Mobility status (hypotensive upon standing - improved, now reclined) PT Visit Diagnosis: Unsteadiness on feet (R26.81);Muscle weakness (generalized) (M62.81);Difficulty in walking, not elsewhere classified (R26.2)     Time: 4540-9811 PT Time Calculation (min) (ACUTE ONLY): 17 min  Charges:    $Therapeutic Exercise: 8-22 mins PT General Charges $$ ACUTE PT VISIT: 1 Visit                     Kathlyn Sacramento, PT, DPT [facility]  Rehabilitation Services Physical Therapist Office: [phone] Website: [email or web address]    Berton Mount 05/21/2023, 11:45 AM

## 2023-05-21 NOTE — Progress Notes (Signed)
 PROGRESS NOTE        PATIENT DETAILS Name: Melinda Stone Age: 43 y.o. Sex: female Date of Birth: [birth year] Admit Date: 05/11/2023 Admitting Physician Janann Colonel, MD ZOX:[id number], Corrie Dandy, NP  Brief Summary: Patient is a 43 y.o.  female with history of asthma, DM-2, chronic pain syndrome, mood disorder-who presented to the ED with fever/chills/myalgias/nausea/vomiting (son recently positive for influenza)-patient was found to have influenza-and pneumonia on imaging studies-patient was started on antibiotic therapy and admitted to the hospitalist service-however post admission patient started developing shortness of breath-and on 2/23 developed worsening hypoxemia requiring intubation and transferred to the ICU.  She was stabilized in the ICU-extubated-postextubation-she required BiPAP for a few days-found to have ICU delirium and oropharyngeal dysphagia requiring NG tube feeding.  She was stabilized and transferred to [facility] on 3/3.   Significant events: 2/22>> subjective fever/chills/myalgias-influenza A positive-pneumonia on imaging-admit to [facility] 2/23>> worsening hypoxemia-transferred to ICU-intubated. 2/28>> extubated-but requiring BiPAP.Cortrak placed 3/03>> transferred to [facility].  Modified barium swallow-started on regular diet by SLP 3/04>> Cortrak removed  Significant studies: 2/22>> CTA chest: No PE-multifocal PNA-right lung predominant. 2/22>> MRI brain: No acute intracranial process. 2/23>> RUQ ultrasound: Chronic hepatomegaly-s/p cholecystectomy-unremarkable appearance otherwise. 2/24>> echo: EF 65-70%, grade 1 diastolic dysfunction 3/01>> CT head: No acute findings.  Significant microbiology data: 2/22>> influenza A by PCR: Positive 2/22>> COVID/influenza B/RSV PCR: Negative 2/22>> respiratory virus panel: Negative 2/23>> sputum culture: Negative 2/23>> blood culture: Negative 2/25>> tracheal aspirate culture: Negative  Procedures: As  above  Consults: PCCM  Subjective: Ate 75% of her breakfast-wants her cortrak tube removed-has been causing her some discomfort pain and nausea.  Objective: Vitals: Blood pressure 115/63, pulse 94, temperature 97.9 F (36.6 C), temperature source Oral, resp. rate 20, height 5\' 9"  (1.753 m), weight 95.3 kg, last menstrual period 04/01/2023, SpO2 92%.   Exam: Gen Exam:Alert awake-not in any distress HEENT:atraumatic, normocephalic Chest: B/L clear to auscultation anteriorly CVS:S1S2 regular Abdomen:soft non tender, non distended Extremities:no edema Neurology: Non focal-some generalized weakness. Skin: no rash  Pertinent Labs/Radiology:    Latest Ref Rng & Units 05/20/2023    8:52 AM 05/19/2023    4:31 AM 05/18/2023    7:55 AM  CBC  WBC 4.0 - 10.5 K/uL 6.4  5.1    Hemoglobin 12.0 - 15.0 g/dL 8.6  7.1  7.2   Hematocrit 36.0 - 46.0 % 27.8  23.5  23.4   Platelets 150 - 400 K/uL 298  231      Lab Results  Component Value Date   NA 144 05/21/2023   K 3.3 (L) 05/21/2023   CL 110 05/21/2023   CO2 25 05/21/2023      Assessment/Plan: Acute hypoxic respiratory failure secondary to influenza pneumonia/bacterial pneumonia and asthma exacerbation Required ETT from 2/23 to 2/28-then was on BiPAP for several days Currently much improved and on room air Has completed a course of steroids/Tamiflu/antibiotics Continue bronchodilators and pulmonary tolerating Continue to mobilize.  Acute metabolic encephalopathy Initially secondary to hypoxia/PNA-then developed ICU delirium Currently much improved-and awake/alert/following all commands Neuroimaging negative Delirium precautions  AKI  Likely secondary to ischemic ATN Overall improved-creatinine has down trended Avoid nephrotoxic agents and follow closely.  Hypernatremia Secondary to free water deficit Resolved with free water through NG tube Sodium levels have normalized-encourage oral intake and follow  electrolytes  Normocytic anemia Likely secondary to acute illness-in a  background of known iron deficiency anemia S/p 4 units of PRBC this hospitalization-last transfused on 2/26 Hb stable-follow.  Oropharyngeal dysphagia Related to intubation/critical illness/debility/deconditioning Rapidly improving Started on regular diet following MBS on 3/3 Requesting NG tube to be removed today-she ate approximately 75% of her meals this morning-will remove NG tube and liberalize oral intake Follow.  HTN BP stable-continue metoprolol.  DM-2 (A1c 5.0 on 2/22) Hypoglycemic event this morning-no longer on tube feeds Change Lantus to 30 units daily (was on twice daily) Switch to Premeal SSI Will follow CBGs and make changes accordingly.  Recent Labs    05/21/23 0432 05/21/23 0520 05/21/23 0839  GLUCAP 56* 106* 207*    Chronic pain syndrome/mood disorder Continue Percocet Continue gabapentin at half of home dose Resume amitriptyline/Xanax over the next several days.  Critical illness and debility/deconditioning PT/OT eval-CIR recommended.  Nutrition Status: Nutrition Problem: Inadequate oral intake Etiology: inability to eat Signs/Symptoms: NPO status Interventions: Tube feeding    Class 1 Obesity: Estimated body mass index is 31.03 kg/m as calculated from the following:   Height as of this encounter: 5\' 9"  (1.753 m).   Weight as of this encounter: 95.3 kg.   Code status:   Code Status: Full Code   DVT Prophylaxis: heparin injection 5,000 Units Start: 05/14/23 2200 Place and maintain sequential compression device Start: 05/11/23 1225   Family Communication: Brother-[phone] updated 3/4   Disposition Plan: Status is: Inpatient Remains inpatient appropriate because: Severity of illness   Planned Discharge Destination:Rehabilitation facility   Diet: Diet Order             Diet Carb Modified Fluid consistency: Nectar Thick; Room service appropriate? Yes   Diet effective now                     Antimicrobial agents: Anti-infectives (From admission, onward)    Start     Dose/Rate Route Frequency Ordered Stop   05/14/23 1000  oseltamivir (TAMIFLU) capsule 30 mg        30 mg Per Tube Daily 05/13/23 1446 05/16/23 0940   05/12/23 2200  oseltamivir (TAMIFLU) capsule 75 mg  Status:  Discontinued        75 mg Per Tube 2 times daily 05/12/23 1600 05/13/23 1446   05/12/23 1500  cefTRIAXone (ROCEPHIN) 2 g in sodium chloride 0.9 % 100 mL IVPB  Status:  Discontinued        2 g 200 mL/hr over 30 Minutes Intravenous Every 24 hours 05/11/23 1617 05/11/23 2109   05/12/23 1500  azithromycin (ZITHROMAX) 500 mg in sodium chloride 0.9 % 250 mL IVPB  Status:  Discontinued        500 mg 250 mL/hr over 60 Minutes Intravenous Every 24 hours 05/11/23 1617 05/17/23 0923   05/12/23 1300  vancomycin (VANCOREADY) IVPB 1500 mg/300 mL        1,500 mg 150 mL/hr over 120 Minutes Intravenous  Once 05/12/23 1203 05/12/23 1700   05/12/23 1245  vancomycin (VANCOREADY) IVPB 1750 mg/350 mL  Status:  Discontinued        1,750 mg 175 mL/hr over 120 Minutes Intravenous  Once 05/12/23 1148 05/12/23 1203   05/12/23 1149  vancomycin variable dose per unstable renal function (pharmacist dosing)  Status:  Discontinued         Does not apply See admin instructions 05/12/23 1149 05/13/23 1445   05/12/23 0830  cefTRIAXone (ROCEPHIN) 2 g in sodium chloride 0.9 % 100 mL IVPB  2 g 200 mL/hr over 30 Minutes Intravenous Daily 05/12/23 0751 05/17/23 1618   05/11/23 2215  oseltamivir (TAMIFLU) capsule 75 mg  Status:  Discontinued        75 mg Oral 2 times daily 05/11/23 2120 05/11/23 2121   05/11/23 2215  oseltamivir (TAMIFLU) capsule 75 mg  Status:  Discontinued        75 mg Oral 2 times daily 05/11/23 2123 05/12/23 1600   05/11/23 2200  ceFEPIme (MAXIPIME) 2 g in sodium chloride 0.9 % 100 mL IVPB  Status:  Discontinued        2 g 200 mL/hr over 30 Minutes Intravenous Every 8  hours 05/11/23 2109 05/12/23 0751   05/11/23 2200  vancomycin (VANCOREADY) IVPB 2000 mg/400 mL  Status:  Discontinued        2,000 mg 200 mL/hr over 120 Minutes Intravenous  Once 05/11/23 2109 05/11/23 2118   05/11/23 2108  vancomycin variable dose per unstable renal function (pharmacist dosing)  Status:  Discontinued         Does not apply See admin instructions 05/11/23 2109 05/11/23 2118   05/11/23 1015  cefTRIAXone (ROCEPHIN) 1 g in sodium chloride 0.9 % 100 mL IVPB        1 g 200 mL/hr over 30 Minutes Intravenous  Once 05/11/23 1000 05/11/23 1539   05/11/23 1015  azithromycin (ZITHROMAX) 500 mg in sodium chloride 0.9 % 250 mL IVPB        500 mg 250 mL/hr over 60 Minutes Intravenous  Once 05/11/23 1000 05/11/23 1657        MEDICATIONS: Scheduled Meds:  sodium chloride   Intravenous Once   sodium chloride   Intravenous Once   arformoterol  15 mcg Nebulization BID   budesonide (PULMICORT) nebulizer solution  0.5 mg Nebulization BID   Chlorhexidine Gluconate Cloth  6 each Topical Daily   cyanocobalamin  1,000 mcg Intramuscular Once   docusate  100 mg Per Tube BID   feeding supplement (PROSource TF20)  60 mL Per Tube Daily   feeding supplement (VITAL 1.5 CAL)  550 mL Per Tube Q24H   fiber supplement (BANATROL TF)  60 mL Per Tube BID   free water  200 mL Per Tube Q2H   gabapentin  400 mg Per Tube Q8H   heparin injection (subcutaneous)  5,000 Units Subcutaneous Q8H   insulin aspart  0-24 Units Subcutaneous Q4H   insulin aspart  5 Units Subcutaneous Q4H   insulin glargine  30 Units Subcutaneous BID   metoprolol tartrate  25 mg Per Tube BID   multivitamin  15 mL Per Tube Daily   mouth rinse  15 mL Mouth Rinse TID WC & HS   pantoprazole (PROTONIX) IV  40 mg Intravenous QHS   polyethylene glycol  17 g Per Tube Daily   potassium chloride  40 mEq Per Tube Once   revefenacin  175 mcg Nebulization Daily   sodium chloride flush  3 mL Intravenous Q12H   vitamin B-12  100 mcg Per Tube  Daily   Continuous Infusions: PRN Meds:.acetaminophen (TYLENOL) oral liquid 160 mg/5 mL, acetaminophen, hydrALAZINE, ondansetron (ZOFRAN) IV, mouth rinse, oxyCODONE, polyvinyl alcohol, prochlorperazine   I have personally reviewed following labs and imaging studies  LABORATORY DATA: CBC: Recent Labs  Lab 05/16/23 0400 05/16/23 0401 05/17/23 0343 05/18/23 0349 05/18/23 0749 05/18/23 0755 05/19/23 0431 05/20/23 0852  WBC 5.3  --  6.3 5.5  --   --  5.1 6.4  HGB 7.6*   < >  7.5* 7.3* 6.5* 7.2* 7.1* 8.6*  HCT 23.3*   < > 23.6* 23.9* 19.0* 23.4* 23.5* 27.8*  MCV 84.7  --  86.4 88.2  --   --  87.7 86.9  PLT 101*  --  132* 184  --   --  231 298   < > = values in this interval not displayed.    Basic Metabolic Panel: Recent Labs  Lab 05/15/23 0400 05/15/23 0407 05/16/23 0400 05/16/23 0401 05/17/23 0343 05/18/23 0349 05/18/23 0749 05/19/23 0431 05/20/23 0852 05/21/23 0440  NA 145   < > 148*   < > 150* 152* 155* 153* 151* 144  K 4.8   < > 4.3   < > 4.5 4.1 3.9 3.8 4.0 3.3*  CL 113*  --  116*  --  117* 114*  --  117* 114* 110  CO2 21*  --  20*  --  26 28  --  33* 25 25  GLUCOSE 312*  --  199*  --  204* 210*  --  230* 109* 60*  BUN 59*  --  56*  --  49* 48*  --  49* 56* 62*  CREATININE 2.94*  --  2.62*  --  1.83* 1.51*  --  1.24* 1.22* 1.47*  CALCIUM 8.0*  --  8.6*  --  8.7* 8.5*  --  7.8* 8.5* 8.2*  MG 2.3  --  2.5*  --   --   --   --  2.0 2.1 2.0  PHOS 5.2*  --  4.3  --   --   --   --   --  3.6 4.9*   < > = values in this interval not displayed.    GFR: Estimated Creatinine Clearance: 61.9 mL/min (A) (by C-G formula based on SCr of 1.47 mg/dL (H)).  Liver Function Tests: Recent Labs  Lab 05/16/23 0400 05/20/23 0852 05/21/23 0440  AST 28  --   --   ALT 21  --   --   ALKPHOS 144*  --   --   BILITOT 0.7  --   --   PROT 5.6*  --   --   ALBUMIN 2.1* 2.6* 2.3*   No results for input(s): "LIPASE", "AMYLASE" in the last 168 hours. No results for input(s): "AMMONIA"  in the last 168 hours.  Coagulation Profile: No results for input(s): "INR", "PROTIME" in the last 168 hours.  Cardiac Enzymes: No results for input(s): "CKTOTAL", "CKMB", "CKMBINDEX", "TROPONINI" in the last 168 hours.  BNP (last 3 results) No results for input(s): "PROBNP" in the last 8760 hours.  Lipid Profile: No results for input(s): "CHOL", "HDL", "LDLCALC", "TRIG", "CHOLHDL", "LDLDIRECT" in the last 72 hours.  Thyroid Function Tests: No results for input(s): "TSH", "T4TOTAL", "FREET4", "T3FREE", "THYROIDAB" in the last 72 hours.  Anemia Panel: No results for input(s): "VITAMINB12", "FOLATE", "FERRITIN", "TIBC", "IRON", "RETICCTPCT" in the last 72 hours.  Urine analysis:    Component Value Date/Time   COLORURINE AMBER (A) 05/11/2023 0612   APPEARANCEUR HAZY (A) 05/11/2023 0612   LABSPEC 1.015 05/11/2023 0612   PHURINE 5.0 05/11/2023 0612   GLUCOSEU 50 (A) 05/11/2023 0612   HGBUR LARGE (A) 05/11/2023 0612   BILIRUBINUR NEGATIVE 05/11/2023 0612   BILIRUBINUR small (A) 02/04/2023 1027   KETONESUR NEGATIVE 05/11/2023 0612   PROTEINUR >=300 (A) 05/11/2023 0612   UROBILINOGEN 0.2 02/04/2023 1027   NITRITE NEGATIVE 05/11/2023 0612   LEUKOCYTESUR NEGATIVE 05/11/2023 0612    Sepsis Labs: Lactic Acid,  Venous    Component Value Date/Time   LATICACIDVEN 1.7 05/12/2023 0152    MICROBIOLOGY: Recent Results (from the past 240 hours)  Respiratory (~20 pathogens) panel by PCR     Status: None   Collection Time: 05/11/23 12:10 PM   Specimen: Nasopharyngeal Swab; Respiratory  Result Value Ref Range Status   Adenovirus NOT DETECTED NOT DETECTED Final   Coronavirus 229E NOT DETECTED NOT DETECTED Final    Comment: (NOTE) The Coronavirus on the Respiratory Panel, DOES NOT test for the novel  Coronavirus (2019 nCoV)    Coronavirus HKU1 NOT DETECTED NOT DETECTED Final   Coronavirus NL63 NOT DETECTED NOT DETECTED Final   Coronavirus OC43 NOT DETECTED NOT DETECTED Final    Metapneumovirus NOT DETECTED NOT DETECTED Final   Rhinovirus / Enterovirus NOT DETECTED NOT DETECTED Final   Influenza A NOT DETECTED NOT DETECTED Final   Influenza B NOT DETECTED NOT DETECTED Final   Parainfluenza Virus 1 NOT DETECTED NOT DETECTED Final   Parainfluenza Virus 2 NOT DETECTED NOT DETECTED Final   Parainfluenza Virus 3 NOT DETECTED NOT DETECTED Final   Parainfluenza Virus 4 NOT DETECTED NOT DETECTED Final   Respiratory Syncytial Virus NOT DETECTED NOT DETECTED Final   Bordetella pertussis NOT DETECTED NOT DETECTED Final   Bordetella Parapertussis NOT DETECTED NOT DETECTED Final   Chlamydophila pneumoniae NOT DETECTED NOT DETECTED Final   Mycoplasma pneumoniae NOT DETECTED NOT DETECTED Final    Comment: Performed at [facility], [address] [place], [place] [address]  Resp panel by RT-PCR (RSV, Flu A&B, Covid) Anterior Nasal Swab     Status: Abnormal   Collection Time: 05/11/23  8:09 PM   Specimen: Anterior Nasal Swab  Result Value Ref Range Status   SARS Coronavirus 2 by RT PCR NEGATIVE NEGATIVE Final   Influenza A by PCR POSITIVE (A) NEGATIVE Final   Influenza B by PCR NEGATIVE NEGATIVE Final    Comment: (NOTE) The Xpert Xpress SARS-CoV-2/FLU/RSV plus assay is intended as an aid in the diagnosis of influenza from Nasopharyngeal swab specimens and should not be used as a sole basis for treatment. Nasal washings and aspirates are unacceptable for Xpert Xpress SARS-CoV-2/FLU/RSV testing.  Fact Sheet for Patients: BloggerCourse.com  Fact Sheet for Healthcare Providers: SeriousBroker.it  This test is not yet approved or cleared by the [place] FDA and has been authorized for detection and/or diagnosis of SARS-CoV-2 by FDA under an Emergency Use Authorization (EUA). This EUA will remain in effect (meaning this test can be used) for the duration of the COVID-19 declaration under Section 564(b)(1) of the  Act, 21 U.S.C. section 360bbb-3(b)(1), unless the authorization is terminated or revoked.     Resp Syncytial Virus by PCR NEGATIVE NEGATIVE Final    Comment: (NOTE) Fact Sheet for Patients: BloggerCourse.com  Fact Sheet for Healthcare Providers: SeriousBroker.it  This test is not yet approved or cleared by the [place] FDA and has been authorized for detection and/or diagnosis of SARS-CoV-2 by FDA under an Emergency Use Authorization (EUA). This EUA will remain in effect (meaning this test can be used) for the duration of the COVID-19 declaration under Section 564(b)(1) of the Act, 21 U.S.C. section 360bbb-3(b)(1), unless the authorization is terminated or revoked.  Performed at [facility], [address] [place], [place] [address]   Culture, Respiratory w Gram Stain     Status: None   Collection Time: 05/12/23  9:17 AM   Specimen: Tracheal Aspirate; Respiratory  Result Value  Ref Range Status   Specimen Description TRACHEAL ASPIRATE  Final   Special Requests NONE  Final   Gram Stain NO WBC SEEN NO ORGANISMS SEEN   Final   Culture   Final    NO GROWTH 2 DAYS Performed at [facility], [address], [place], [place] [address]    Report Status 05/14/2023 FINAL  Final  Culture, blood (Routine X 2) w Reflex to ID Panel     Status: None   Collection Time: 05/12/23  9:41 AM   Specimen: BLOOD LEFT HAND  Result Value Ref Range Status   Specimen Description BLOOD LEFT HAND  Final   Special Requests   Final    BOTTLES DRAWN AEROBIC AND ANAEROBIC Blood Culture results may not be optimal due to an inadequate volume of blood received in culture bottles   Culture   Final    NO GROWTH 5 DAYS Performed at [facility], [address], [place], [place] [address]    Report Status 05/17/2023 FINAL  Final  Culture, blood (Routine X 2) w Reflex to ID Panel     Status: None   Collection Time: 05/12/23  9:44 AM    Specimen: BLOOD LEFT ARM  Result Value Ref Range Status   Specimen Description BLOOD LEFT ARM  Final   Special Requests   Final    BOTTLES DRAWN AEROBIC AND ANAEROBIC Blood Culture results may not be optimal due to an inadequate volume of blood received in culture bottles   Culture   Final    NO GROWTH 5 DAYS Performed at [facility], [address], [place], [place] [address]    Report Status 05/17/2023 FINAL  Final  MRSA Next Gen by PCR, Nasal     Status: None   Collection Time: 05/13/23  8:54 AM   Specimen: Nasal Mucosa; Nasal Swab  Result Value Ref Range Status   MRSA by PCR Next Gen NOT DETECTED NOT DETECTED Final    Comment: (NOTE) The GeneXpert MRSA Assay (FDA approved for NASAL specimens only), is one component of a comprehensive MRSA colonization surveillance program. It is not intended to diagnose MRSA infection nor to guide or monitor treatment for MRSA infections. Test performance is not FDA approved in patients less than 32 years old. Performed at [facility], [address], [place], [place] [address]   Culture, Respiratory w Gram Stain     Status: None   Collection Time: 05/14/23  3:59 PM   Specimen: Tracheal Aspirate; Respiratory  Result Value Ref Range Status   Specimen Description TRACHEAL ASPIRATE  Final   Special Requests NONE  Final   Gram Stain NO WBC SEEN NO ORGANISMS SEEN   Final   Culture   Final    NO GROWTH 2 DAYS Performed at [facility], [address], [place], [place] [address]    Report Status 05/17/2023 FINAL  Final    RADIOLOGY STUDIES/RESULTS: DG Swallowing Func-Speech Pathology Result Date: 05/20/2023 Table formatting from the original result was not included. Modified Barium Swallow Study Patient Details Name: Melinda Stone MRN: [id number] Date of Birth: [birth year] Today's Date: 05/20/2023 HPI/PMH: HPI: Melinda Stone is a 43 yo female presenting to ED 2/22 with complaints of nausea, vomiting, abdominal  pain, and myalgias. Found to be Influenza A positive. CTA showed R predominant consolidative and nodular opacities. ETT 2/23-2/28. 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 Boluses progress posteriorly to the level of the pyriform sinuses prior to swallow initiation, which intermittently allows thin liquids to enter her laryngeal vestibule. At times, they are immediately ejected by complete laryngeal elevation but other times, they progress quickly below the level of the vocal folds without being sensed (PAS 8). No further penetration or aspiration was observed with subsequent trials of nectar and honey thick liquids, purees, or solids. She initiates efficient mastication with complete oral clearance. Pt does have a collection of vallecular residue with solids specifically, but a liquid wash was effective in clearing this. Recommend initiating diet of regular solids with nectar thick liquids. SLP will f/u to trial RMST and compensatory strategies to upgrade diet as clinically indicated. DIGEST Swallow Severity Rating*  Safety: 2  Efficiency: 2  Overall Pharyngeal Swallow Severity: 2 (moderate) 1: mild; 2: moderate; 3: severe; 4: profound *The Dynamic Imaging Grade of Swallowing Toxicity is standardized for the head and neck cancer population, however, demonstrates promising clinical applications across populations to standardize the clinical rating of pharyngeal swallow safety and severity. Factors that may increase risk of adverse event in presence of aspiration [name] & Clearance [name] 2021): Factors that may increase risk of adverse event in presence of aspiration [name] & Clearance [name] 2021): Frail or deconditioned; Weak cough; Presence of tubes  (ETT, trach, NG, etc.) Recommendations/Plan: Swallowing Evaluation Recommendations Swallowing Evaluation Recommendations Recommendations: PO diet PO Diet Recommendation: Regular; Mildly thick liquids (Level 2, nectar thick) Liquid Administration via: Cup; Straw Medication Administration: Whole meds with puree Supervision: Patient able to self-feed; Full supervision/cueing for swallowing strategies Swallowing strategies  : Minimize environmental distractions; Slow rate; Small bites/sips Postural changes: Position pt fully upright for meals Oral care recommendations: Oral care BID (2x/day) Caregiver Recommendations: Avoid jello, ice cream, thin soups, popsicles; Remove water pitcher Treatment Plan Treatment Plan Treatment recommendations: Therapy as outlined in treatment plan below Follow-up recommendations: Acute inpatient rehab (3 hours/day) Functional status assessment: Patient has had a recent decline in their functional status and demonstrates the ability to make significant improvements in function in a reasonable and predictable amount of time. Treatment frequency: Min 2x/week Treatment duration: 2 weeks Interventions: Aspiration precaution training; Compensatory techniques; Patient/family education; Trials of upgraded texture/liquids; Diet toleration management by SLP; Respiratory muscle strength training Recommendations Recommendations for follow up therapy are one component of a multi-disciplinary discharge planning process, led by the attending physician.  Recommendations may be updated based on patient status, additional functional criteria and insurance authorization. Assessment: Orofacial Exam: Orofacial Exam Oral Cavity: Oral Hygiene: WFL Oral Cavity - Dentition: Adequate natural dentition Orofacial Anatomy: WFL Oral Motor/Sensory Function: WFL Anatomy: Anatomy: Suspected cervical osteophytes Boluses Administered: Boluses Administered Boluses Administered: Thin liquids (Level 0); Mildly thick liquids  (Level 2, nectar thick); Moderately thick liquids (Level 3, honey thick); Puree; Solid  Oral Impairment Domain: Oral Impairment Domain Lip Closure: No labial escape Tongue control during bolus hold: Posterior escape of less than half of bolus Bolus preparation/mastication: Timely and efficient chewing and mashing Bolus transport/lingual motion: Brisk tongue motion Oral residue: Complete oral clearance Location of oral residue : N/A Initiation of pharyngeal swallow : Pyriform sinuses  Pharyngeal Impairment Domain: Pharyngeal Impairment Domain Soft palate elevation: No bolus between soft palate (SP)/pharyngeal wall (PW) Laryngeal elevation: Complete superior movement of thyroid cartilage with complete approximation of arytenoids to epiglottic petiole Anterior  hyoid excursion: Complete anterior movement Epiglottic movement: Complete inversion Laryngeal vestibule closure: Complete, no air/contrast in laryngeal vestibule Pharyngeal stripping wave : Present - complete Pharyngeal contraction (A/P view only): N/A Pharyngoesophageal segment opening: Complete distension and complete duration, no obstruction of flow Tongue base retraction: Trace column of contrast or air between tongue base and PPW Pharyngeal residue: Collection of residue within or on pharyngeal structures Location of pharyngeal residue: Valleculae  Esophageal Impairment Domain: No data recorded Pill: No data recorded Penetration/Aspiration Scale Score: Penetration/Aspiration Scale Score 1.  Material does not enter airway: Moderately thick liquids (Level 3, honey thick); Puree; Solid; Mildly thick liquids (Level 2, nectar thick) 8.  Material enters airway, passes BELOW cords without attempt by patient to eject out (silent aspiration) : Thin liquids (Level 0) Compensatory Strategies: Compensatory Strategies Compensatory strategies: Yes Straw: Ineffective Ineffective Straw: Thin liquid (Level 0)   General Information: Caregiver present: No  Diet Prior to this  Study: NPO; Cortrak/Small bore NG tube   Temperature : Normal   Respiratory Status: WFL   Supplemental O2: Nasal cannula   History of Recent Intubation: Yes  Behavior/Cognition: Alert; Cooperative; Pleasant mood Self-Feeding Abilities: Able to self-feed Baseline vocal quality/speech: Dysphonic; Hypophonia/low volume Volitional Cough: Able to elicit Volitional Swallow: Able to elicit Exam Limitations: No limitations Goal Planning: Prognosis for improved oropharyngeal function: Good Barriers to Reach Goals: Time post onset No data recorded Patient/Family Stated Goal: wants to eat/drink Consulted and agree with results and recommendations: Patient; Nurse Pain: Pain Assessment Pain Assessment: Faces Faces Pain Scale: 0 Facial Expression: 0 Body Movements: 0 Muscle Tension: 0 Compliance with ventilator (intubated pts.): N/A Vocalization (extubated pts.): 0 CPOT Total: 0 Pain Location: pt reports back Pain Intervention(s): Monitored during session End of Session: Start Time:SLP Start Time (ACUTE ONLY): 1335 Stop Time: SLP Stop Time (ACUTE ONLY): 1352 Time Calculation:SLP Time Calculation (min) (ACUTE ONLY): 17 min Charges: SLP Evaluations $ SLP Speech Visit: 1 Visit SLP Evaluations $BSS Swallow: 1 Procedure $MBS Swallow: 1 Procedure SLP visit diagnosis: SLP Visit Diagnosis: Dysphagia, unspecified (R13.10) Past Medical History: Past Medical History: Diagnosis Date  Anemia   Arthritis 08/17/2020  Chronic back pain   Diabetes mellitus without complication (HCC)   type 2  GERD (gastroesophageal reflux disease)   Migraine   Ovarian cyst   Pneumonia   Polyneuropathy  Past Surgical History: Past Surgical History: Procedure Laterality Date  CESAREAN SECTION    CHOLECYSTECTOMY  2004  LUMBAR LAMINECTOMY N/A 05/30/2020  Procedure: Lumbar five Laminectomy,  Bilateral Microdiscectomy, Left Lumbar five -Sacral one Microdiscectomy;  Surgeon: [name], MD;  Location: [facility] OR;  Service: Orthopedics;  Laterality: N/A;  LUMBAR LAMINECTOMY  Left 11/14/2020  Procedure: LEFT LUMBAR FOUR-FIVE MICRODISCECTOMY;  Surgeon: [name], MD;  Location: [facility] OR;  Service: Orthopedics;  Laterality: Left;  TUBAL LIGATION  10/19/2010 [name], M.A., CF-SLP Speech Language Pathology, Acute Rehabilitation Services Secure Chat preferred [phone] 05/20/2023, 3:14 PM    LOS: 10 days   Jeoffrey Massed, MD  [facility]    To contact the attending provider between 7A-7P or the covering provider during after hours 7P-7A, please log into the web site [email or web address] and access using universal [place] password for that web site. If you do not have the password, please call the hospital operator.  05/21/2023, 9:06 AM

## 2023-05-21 NOTE — Evaluation (Signed)
 Occupational Therapy Evaluation Patient Details Name: Melinda Stone MRN: [id number] DOB: 09-28-1980 Today's Date: 05/21/2023   History of Present Illness   43 year old F presented to ED on 2/22 with nausea, vomiting, and abdominal pain. Pt went into acute respiratory failure with hypoxia due to flu A and asthma exacerbation requiring intubation on 2/22. Pt extubated on 2/28. PMH: mild intermittent asthma, IDDM-2, IDA, GERD, chronic pain and mood disorder     Clinical Impressions Pt reports ind at baseline with ADLs/functional mobility, lives with family who can assist at d/c. Pt currently needing min-max A for ADLs, and mod A for pivot transfers with RW. Pt with posterior lean in standing with RW. Pt needs mod A for posterior pericare and LB ADLs. Pt lethargic throughout but follows commands with incr time. Pt presenting with impairments listed below, will follow acutely. Patient will benefit from intensive inpatient follow-up therapy, >3 hours/day to maximize safety/ind with ADL/functional mobility.      If plan is discharge home, recommend the following:   A lot of help with walking and/or transfers;A lot of help with bathing/dressing/bathroom;Assistance with cooking/housework;Direct supervision/assist for financial management;Direct supervision/assist for medications management;Assist for transportation;Help with stairs or ramp for entrance;Supervision due to cognitive status     Functional Status Assessment   Patient has had a recent decline in their functional status and demonstrates the ability to make significant improvements in function in a reasonable and predictable amount of time.     Equipment Recommendations   Other (comment) (defer)     Recommendations for Other Services   PT consult;Rehab consult     Precautions/Restrictions   Precautions Precautions: Fall Restrictions Weight Bearing Restrictions Per Provider Order: No     Mobility Bed  Mobility               General bed mobility comments: on BSC upon arrival and in chair at departure    Transfers Overall transfer level: Needs assistance Equipment used: Rolling walker (2 wheels) Transfers: Sit to/from Stand, Bed to chair/wheelchair/BSC Sit to Stand: Mod assist     Step pivot transfers: Mod assist            Balance Overall balance assessment: Needs assistance Sitting-balance support: Feet supported, No upper extremity supported Sitting balance-Leahy Scale: Fair     Standing balance support: Bilateral upper extremity supported, During functional activity, Reliant on assistive device for balance Standing balance-Leahy Scale: Poor Standing balance comment: dependent on external support                           ADL either performed or assessed with clinical judgement   ADL Overall ADL's : Needs assistance/impaired Eating/Feeding: Set up   Grooming: Minimal assistance   Upper Body Bathing: Minimal assistance;Sitting   Lower Body Bathing: Maximal assistance   Upper Body Dressing : Minimal assistance   Lower Body Dressing: Maximal assistance   Toilet Transfer: Moderate assistance;Stand-pivot;BSC/3in1;Rolling walker (2 wheels)   Toileting- Clothing Manipulation and Hygiene: Moderate assistance       Functional mobility during ADLs: Moderate assistance;Rolling walker (2 wheels)       Vision   Vision Assessment?: No apparent visual deficits Additional Comments: will further assess, pt drowsy and closing eyes     Perception Perception: Not tested       Praxis Praxis: Not tested       Pertinent Vitals/Pain Pain Assessment Pain Assessment: No/denies pain     Extremity/Trunk Assessment Upper Extremity Assessment Upper  Extremity Assessment: Generalized weakness   Lower Extremity Assessment Lower Extremity Assessment: Generalized weakness   Cervical / Trunk Assessment Cervical / Trunk Assessment: Normal    Communication Communication Factors Affecting Communication: Reduced clarity of speech   Cognition Arousal: Lethargic Behavior During Therapy: Flat affect Cognition: No family/caregiver present to determine baseline             OT - Cognition Comments: pt overall with                 Following commands: Intact       Cueing  General Comments   Cueing Techniques: Verbal cues;Tactile cues  VSS on RA   Exercises     Shoulder Instructions      Home Living Family/patient expects to be discharged to:: Private residence Living Arrangements: Children Available Help at Discharge: Family;Available PRN/intermittently Type of Home: Apartment Home Access: Level entry     Home Layout: One level     Bathroom Shower/Tub: Chief Strategy Officer: Standard     Home Equipment: None          Prior Functioning/Environment               Mobility Comments: reports not using AD, doesn't drive, children do her grocery shopping ADLs Comments: mod I    OT Problem List: Decreased strength;Decreased range of motion;Decreased activity tolerance;Impaired balance (sitting and/or standing);Decreased cognition;Decreased safety awareness   OT Treatment/Interventions: Self-care/ADL training;Therapeutic exercise;DME and/or AE instruction;Energy conservation;Therapeutic activities;Patient/family education;Balance training      OT Goals(Current goals can be found in the care plan section)   Acute Rehab OT Goals Patient Stated Goal: none stated OT Goal Formulation: With patient Time For Goal Achievement: 06/04/23 Potential to Achieve Goals: Good   OT Frequency:  Min 1X/week    Co-evaluation              AM-PAC OT "6 Clicks" Daily Activity     Outcome Measure Help from another person eating meals?: A Little Help from another person taking care of personal grooming?: A Little Help from another person toileting, which includes using toliet, bedpan, or  urinal?: A Lot Help from another person bathing (including washing, rinsing, drying)?: A Lot Help from another person to put on and taking off regular upper body clothing?: A Lot Help from another person to put on and taking off regular lower body clothing?: A Lot 6 Click Score: 14   End of Session Equipment Utilized During Treatment: Rolling walker (2 wheels) Nurse Communication: Mobility status  Activity Tolerance: Patient tolerated treatment well;Patient limited by fatigue Patient left: with call bell/phone within reach;in chair (no chair alarm present on unit, RN aware)  OT Visit Diagnosis: Unsteadiness on feet (R26.81);Other abnormalities of gait and mobility (R26.89);Muscle weakness (generalized) (M62.81)                Time: 1001-1018 OT Time Calculation (min): 17 min Charges:  OT General Charges $OT Visit: 1 Visit OT Evaluation $OT Eval Moderate Complexity: 1 Mod  Traves Majchrzak K, OTD, OTR/L SecureChat Preferred Acute Rehab [phone]   Terrell Shimko K Koonce 05/21/2023, 10:25 AM

## 2023-05-21 NOTE — Progress Notes (Addendum)
 Speech Language Pathology Treatment: Dysphagia  Patient Details Name: TAKISHA PELLE MRN: [id number] DOB: January 24, 1981 Today's Date: 05/21/2023 Time: 3664-4034 SLP Time Calculation (min) (ACUTE ONLY): 8 min  Assessment / Plan / Recommendation Clinical Impression  Pt remains dysphonic, with a hoarse and low quality of voice. She reports no acute concerns with current diet. No s/s of dysphagia or aspiration were noted with trials of nectar thick liquids or regular solids. Reinforced education regarding following solids with liquids to clear pharyngeal residue, with which pt verbalized understanding. She is eager to continue targeting swallowing goals to return to thin liquids. SLP will continue following to target dysphonia and cough strength.    HPI HPI: SEREN CHALOUX is a 43 yo female presenting to ED 2/22 with complaints of nausea, vomiting, abdominal pain, and myalgias. Found to be Influenza A positive. CTA showed R predominant consolidative and nodular opacities. ETT 2/23-2/28. 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  Recommendations may be updated based on patient status, additional functional criteria and insurance authorization.    Recommendations  Diet recommendations: Regular;Nectar-thick liquid Liquids provided via: Cup;Straw Medication Administration: Whole meds with puree Supervision: Patient able to self feed;Intermittent supervision to cue for compensatory strategies Compensations: Minimize environmental distractions;Slow rate;Small sips/bites Postural Changes and/or Swallow Maneuvers: Seated upright 90  degrees                  Oral care BID   Frequent or constant Supervision/Assistance Dysphagia, unspecified (R13.10)     Continue with current plan of care     [name], M.A., CF-SLP Speech Language Pathology, Acute Rehabilitation Services  Secure Chat preferred [phone]   05/21/2023, 5:25 PM

## 2023-05-21 NOTE — Consult Note (Signed)
 Physical Medicine and Rehabilitation Consult Reason for Consult: Rehab Referring Physician: Dr. Jerral Ralph     HPI: Melinda Stone is a 43 y.o. female with past medical history of chronic back pain, tobacco abuse, lumbar spondylosis with prior back surgery, obesity, history of B12 deficiency, history of diabetes mellitus type 2, diabetic retinopathy, GI bleed, diastolic dysfunction, migraines, mood disorder who presented with fever, chills and myalgias.  She was found to have influenza A, CTA of the chest showed pneumonia.  She developed worsening hypoxemia transferred to the ICU and was intubated on 2/23.  On 2/28 she was extubated requiring BiPAP.  She had a core track placed that was removed on 3/4.  Echo with EF 65 to 70%, grade 1 diastolic dysfunction.  She had acute metabolic encephalopathy, now much improved.  Treated for AKI likely due to ischemic ATN, improving.  Reports some dizziness with therapy today.  She has been upgraded to carb modified diet, nectar thick liquids she has been evaluated by PT, OT and SLP found to have functional deficits that would benefit from CIR.   She lives in a [place] apartment with one-step to enter.  She has several children 67 who is 22 years old, 3 who are 41 years old-adopted and a 31 year old daughter who lives right beside her and can provide 24-hour assistance.     Review of Systems  Constitutional:  Positive for malaise/fatigue. Negative for chills and fever.  HENT:  Negative for hearing loss.   Eyes:  Negative for blurred vision and double vision.  Respiratory:  Positive for shortness of breath.   Cardiovascular:  Negative for chest pain.  Gastrointestinal:  Positive for constipation and diarrhea. Negative for abdominal pain.  Genitourinary: Negative.   Musculoskeletal:  Positive for back pain.  Skin:  Negative for rash.  Neurological:  Positive for weakness and headaches. Negative for sensory change.  Psychiatric/Behavioral:   Negative for depression.         Past Medical History:  Diagnosis Date   Anemia     Arthritis 08/17/2020   Chronic back pain     Diabetes mellitus without complication (HCC)      type 2   GERD (gastroesophageal reflux disease)     Migraine     Ovarian cyst     Pneumonia     Polyneuropathy               Past Surgical History:  Procedure Laterality Date   CESAREAN SECTION       CHOLECYSTECTOMY   2004   LUMBAR LAMINECTOMY N/A 05/30/2020    Procedure: Lumbar five Laminectomy,  Bilateral Microdiscectomy, Left Lumbar five -Sacral one Microdiscectomy;  Surgeon: [name], MD;  Location: [facility] OR;  Service: Orthopedics;  Laterality: N/A;   LUMBAR LAMINECTOMY Left 11/14/2020    Procedure: LEFT LUMBAR FOUR-FIVE MICRODISCECTOMY;  Surgeon: [name], MD;  Location: [facility] OR;  Service: Orthopedics;  Laterality: Left;   TUBAL LIGATION   10/19/2010             Family History  Problem Relation Age of Onset   Diabetes Mother     Hypertension Mother     Stroke Mother          Social History:  reports that she has been smoking cigars. She has never used smokeless tobacco. She reports that she does not currently use alcohol. She reports that she does not currently use drugs. Allergies:  Allergies  Allergies  Allergen Reactions   Trulicity [Dulaglutide] Diarrhea            Medications Prior to Admission  Medication Sig Dispense Refill   acetaminophen (TYLENOL) 500 MG tablet Take 500 mg by mouth every 6 (six) hours as needed for mild pain or moderate pain.       amitriptyline (ELAVIL) 25 MG tablet Take 1 tablet (25 mg total) by mouth at bedtime. 30 tablet 6   celecoxib (CELEBREX) 100 MG capsule Take 100 mg by mouth 2 (two) times daily as needed for mild pain (pain score 1-3) (take with food).       cholecalciferol (VITAMIN D3) 25 MCG (1000 UNIT) tablet Take 1,000 Units by mouth daily.       Cyanocobalamin (VITAMIN B-12) 2500 MCG SUBL Place 1 tablet (2,500 mcg total) under the  tongue daily. 30 tablet 2   furosemide (LASIX) 20 MG tablet Take 1 tablet (20 mg total) by mouth daily. 6 tablet 0   gabapentin (NEURONTIN) 800 MG tablet Take 800 mg by mouth 3 (three) times daily.       glipiZIDE (GLUCOTROL) 10 MG tablet Take 10-20 mg by mouth See admin instructions. Take 20 mg by mouth before breakfast and 10 mg before supper       insulin aspart (NOVOLOG) 100 UNIT/ML FlexPen Before each meal 3 times a day, 140-199 - 2 units, 200-250 - 4 units, 251-299 - 8 units,  300-349 - 10 units,  350 or above 12 units. 15 mL 0   insulin glargine (LANTUS SOLOSTAR) 100 UNIT/ML Solostar Pen Inject 30 Units into the skin at bedtime. 9 mL 0   methocarbamol (ROBAXIN) 500 MG tablet Take 1 tablet (500 mg total) by mouth 2 (two) times daily. (Patient taking differently: Take 500 mg by mouth at bedtime.) 20 tablet 0   metoprolol tartrate (LOPRESSOR) 25 MG tablet Take 1 tablet (25 mg total) by mouth 2 (two) times daily. 60 tablet 0   ondansetron (ZOFRAN-ODT) 4 MG disintegrating tablet Take 1 tablet (4 mg total) by mouth every 6 (six) hours as needed for nausea or vomiting. 20 tablet 0   oxyCODONE-acetaminophen (PERCOCET/ROXICET) 5-325 MG tablet Take 1 tablet by mouth every 6 (six) hours as needed for moderate pain or severe pain.       tiZANidine (ZANAFLEX) 4 MG tablet Take 4 mg by mouth every 12 (twelve) hours as needed for muscle spasms (take with food).       Ubrogepant (UBRELVY) 100 MG TABS Take 1 tablet (100 mg total) by mouth as needed. Take 1 tablet at onset of headache, may repeat in 2 hours if needed. Max is 200 mg in 24 hours. 12 tablet 11   vitamin C (ASCORBIC ACID) 250 MG tablet Take 2 tablets (500 mg total) by mouth daily. (Patient taking differently: Take 250 mg by mouth daily.) 180 tablet 0          Home: Home Living Family/patient expects to be discharged to:: Private residence Living Arrangements: Children Available Help at Discharge: Family, Available PRN/intermittently Type of  Home: Apartment Home Access: Level entry Home Layout: One level Bathroom Shower/Tub: Engineer, manufacturing systems: Standard Home Equipment: None  Functional History: Prior Function Prior Level of Function : Independent/Modified Independent Mobility Comments: reports not using AD, doesn't drive, children do her grocery shopping ADLs Comments: mod I Functional Status:  Mobility: Bed Mobility Overal bed mobility: Needs Assistance Bed Mobility: Supine to Sit, Sit to Supine Supine to sit: Mod assist Sit  to supine: Mod assist General bed mobility comments: In recliner Transfers Overall transfer level: Needs assistance Equipment used: Rolling walker (2 wheels) Transfers: Sit to/from Stand Sit to Stand: Min assist Bed to/from chair/wheelchair/BSC transfer type:: Step pivot Step pivot transfers: Mod assist General transfer comment: min assist for boost to stand x2 today, reduced control with descent. Cues for hand placement, slow and effortful to rise. Ambulation/Gait General Gait Details: deferred due to hypotension and lethargy +dizziness   ADL: ADL Overall ADL's : Needs assistance/impaired Eating/Feeding: Set up Grooming: Minimal assistance Upper Body Bathing: Minimal assistance, Sitting Lower Body Bathing: Maximal assistance Upper Body Dressing : Minimal assistance Lower Body Dressing: Maximal assistance Toilet Transfer: Moderate assistance, Stand-pivot, BSC/3in1, Rolling walker (2 wheels) Toileting- Clothing Manipulation and Hygiene: Moderate assistance Functional mobility during ADLs: Moderate assistance, Rolling walker (2 wheels)   Cognition: Cognition Orientation Level: Oriented X4 Cognition Arousal: Lethargic, Suspect due to medications Behavior During Therapy: Flat affect   Blood pressure 109/69, pulse 84, temperature 97.9 F (36.6 C), temperature source Oral, resp. rate 15, height 5\' 9"  (1.753 m), weight 95.3 kg, last menstrual period 04/01/2023, SpO2  95%. Physical Exam   General: No apparent distress, eating lunch HEENT: Head is normocephalic, atraumatic, sclera anicteric, oral mucosa pink and moist, decrease dentition Neck: Supple without JVD or lymphadenopathy Heart: Reg rate and rhythm. No murmurs rubs or gallops Chest: CTA bilaterally without wheezes, rales, or rhonchi; no distress Abdomen: Soft, non-tender, non-distended, bowel sounds positive. Extremities: No clubbing, cyanosis, or edema. Pulses are 2+ Psych: Pt's affect is appropriate. Pt is cooperative Skin: Clean and intact without signs of breakdown Neuro:     Mental Status: AAOx3, memory grossly intact, attention deficits present, delayed responses Speech/Languate: Naming and repetition intact, fluent, follows simple commands CRANIAL NERVES: 2 through 12 grossly intact   MOTOR: RUE: 4/5 Deltoid, 4/5 Biceps, 4/5 Triceps,4/5 Grip LUE: 4/5 Deltoid, 4/5 Biceps, 4/5 Triceps, 4/5 Grip RLE: HF 4/5, KE 4/5, ADF 4/5, APF 4/5 LLE: HF 4/5, KE 4/5, ADF 4/5, APF 4/5   SENSORY: Normal to touch all 4 extremities         Lab Results Last 24 Hours       Results for orders placed or performed during the hospital encounter of 05/11/23 (from the past 24 hours)  Glucose, capillary     Status: Abnormal    Collection Time: 05/20/23  1:12 PM  Result Value Ref Range    Glucose-Capillary 214 (H) 70 - 99 mg/dL    Comment 1 Document in Chart    Glucose, capillary     Status: Abnormal    Collection Time: 05/20/23  3:51 PM  Result Value Ref Range    Glucose-Capillary 197 (H) 70 - 99 mg/dL  Glucose, capillary     Status: Abnormal    Collection Time: 05/20/23  8:00 PM  Result Value Ref Range    Glucose-Capillary 150 (H) 70 - 99 mg/dL  Glucose, capillary     Status: Abnormal    Collection Time: 05/20/23 11:10 PM  Result Value Ref Range    Glucose-Capillary 155 (H) 70 - 99 mg/dL  Glucose, capillary     Status: Abnormal    Collection Time: 05/21/23  4:32 AM  Result Value Ref Range     Glucose-Capillary 56 (L) 70 - 99 mg/dL  Renal function panel     Status: Abnormal    Collection Time: 05/21/23  4:40 AM  Result Value Ref Range    Sodium 144 135 - 145 mmol/L  Potassium 3.3 (L) 3.5 - 5.1 mmol/L    Chloride 110 98 - 111 mmol/L    CO2 25 22 - 32 mmol/L    Glucose, Bld 60 (L) 70 - 99 mg/dL    BUN 62 (H) 6 - 20 mg/dL    Creatinine, Ser 4.69 (H) 0.44 - 1.00 mg/dL    Calcium 8.2 (L) 8.9 - 10.3 mg/dL    Phosphorus 4.9 (H) 2.5 - 4.6 mg/dL    Albumin 2.3 (L) 3.5 - 5.0 g/dL    GFR, Estimated 46 (L) >60 mL/min    Anion gap 9 5 - 15  Magnesium     Status: None    Collection Time: 05/21/23  4:40 AM  Result Value Ref Range    Magnesium 2.0 1.7 - 2.4 mg/dL  Glucose, capillary     Status: Abnormal    Collection Time: 05/21/23  5:20 AM  Result Value Ref Range    Glucose-Capillary 106 (H) 70 - 99 mg/dL  Glucose, capillary     Status: Abnormal    Collection Time: 05/21/23  8:39 AM  Result Value Ref Range    Glucose-Capillary 207 (H) 70 - 99 mg/dL  CBC     Status: Abnormal    Collection Time: 05/21/23  9:09 AM  Result Value Ref Range    WBC 8.7 4.0 - 10.5 K/uL    RBC 2.94 (L) 3.87 - 5.11 MIL/uL    Hemoglobin 7.8 (L) 12.0 - 15.0 g/dL    HCT 62.9 (L) 52.8 - 46.0 %    MCV 83.0 80.0 - 100.0 fL    MCH 26.5 26.0 - 34.0 pg    MCHC 32.0 30.0 - 36.0 g/dL    RDW 41.3 (H) 24.4 - 15.5 %    Platelets 304 150 - 400 K/uL    nRBC 0.0 0.0 - 0.2 %  Glucose, capillary     Status: Abnormal    Collection Time: 05/21/23 12:49 PM  Result Value Ref Range    Glucose-Capillary 122 (H) 70 - 99 mg/dL       Imaging Results (Last 48 hours)  DG Swallowing Func-Speech Pathology Result Date: 05/20/2023 Table formatting from the original result was not included. Modified Barium Swallow Study Patient Details Name: NAYARA TAPLIN MRN: [id number] Date of Birth: [birth year] Today's Date: 05/20/2023 HPI/PMH: HPI: TABATHIA KNOCHE is a 43 yo female presenting to ED 2/22 with complaints of nausea,  vomiting, abdominal pain, and myalgias. Found to be Influenza A positive. CTA showed R predominant consolidative and nodular opacities. ETT 2/23-2/28. 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 Boluses progress posteriorly to the level of the pyriform sinuses prior to swallow initiation, which intermittently allows thin liquids to enter her laryngeal vestibule. At times, they are immediately ejected by complete laryngeal elevation but other times, they progress quickly below the level of the vocal folds without being sensed (PAS 8). No further penetration or aspiration was observed with subsequent trials of nectar and honey thick liquids, purees, or solids. She initiates efficient mastication with complete oral clearance. Pt does have a collection of vallecular residue with solids specifically, but a liquid wash was effective in clearing this. Recommend initiating diet of regular solids with nectar thick liquids. SLP will f/u to trial RMST and compensatory strategies to  upgrade diet as clinically indicated. DIGEST Swallow Severity Rating*  Safety: 2  Efficiency: 2  Overall Pharyngeal Swallow Severity: 2 (moderate) 1: mild; 2: moderate; 3: severe; 4: profound *The Dynamic Imaging Grade of Swallowing Toxicity is standardized for the head and neck cancer population, however, demonstrates promising clinical applications across populations to standardize the clinical rating of pharyngeal swallow safety and severity. Factors that may increase risk of adverse event in presence of aspiration [name] & Clearance [name] 2021): Factors that may increase risk of adverse event in presence of aspiration [name] & Clearance [name] 2021): Frail or deconditioned; Weak cough;  Presence of tubes (ETT, trach, NG, etc.) Recommendations/Plan: Swallowing Evaluation Recommendations Swallowing Evaluation Recommendations Recommendations: PO diet PO Diet Recommendation: Regular; Mildly thick liquids (Level 2, nectar thick) Liquid Administration via: Cup; Straw Medication Administration: Whole meds with puree Supervision: Patient able to self-feed; Full supervision/cueing for swallowing strategies Swallowing strategies  : Minimize environmental distractions; Slow rate; Small bites/sips Postural changes: Position pt fully upright for meals Oral care recommendations: Oral care BID (2x/day) Caregiver Recommendations: Avoid jello, ice cream, thin soups, popsicles; Remove water pitcher Treatment Plan Treatment Plan Treatment recommendations: Therapy as outlined in treatment plan below Follow-up recommendations: Acute inpatient rehab (3 hours/day) Functional status assessment: Patient has had a recent decline in their functional status and demonstrates the ability to make significant improvements in function in a reasonable and predictable amount of time. Treatment frequency: Min 2x/week Treatment duration: 2 weeks Interventions: Aspiration precaution training; Compensatory techniques; Patient/family education; Trials of upgraded texture/liquids; Diet toleration management by SLP; Respiratory muscle strength training Recommendations Recommendations for follow up therapy are one component of a multi-disciplinary discharge planning process, led by the attending physician.  Recommendations may be updated based on patient status, additional functional criteria and insurance authorization. Assessment: Orofacial Exam: Orofacial Exam Oral Cavity: Oral Hygiene: WFL Oral Cavity - Dentition: Adequate natural dentition Orofacial Anatomy: WFL Oral Motor/Sensory Function: WFL Anatomy: Anatomy: Suspected cervical osteophytes Boluses Administered: Boluses Administered Boluses Administered: Thin liquids (Level 0);  Mildly thick liquids (Level 2, nectar thick); Moderately thick liquids (Level 3, honey thick); Puree; Solid  Oral Impairment Domain: Oral Impairment Domain Lip Closure: No labial escape Tongue control during bolus hold: Posterior escape of less than half of bolus Bolus preparation/mastication: Timely and efficient chewing and mashing Bolus transport/lingual motion: Brisk tongue motion Oral residue: Complete oral clearance Location of oral residue : N/A Initiation of pharyngeal swallow : Pyriform sinuses  Pharyngeal Impairment Domain: Pharyngeal Impairment Domain Soft palate elevation: No bolus between soft palate (SP)/pharyngeal wall (PW) Laryngeal elevation: Complete superior movement of thyroid cartilage with complete approximation of arytenoids to epiglottic petiole Anterior hyoid excursion: Complete anterior movement Epiglottic movement: Complete inversion Laryngeal vestibule closure: Complete, no air/contrast in laryngeal vestibule Pharyngeal stripping wave : Present - complete Pharyngeal contraction (A/P view only): N/A Pharyngoesophageal segment opening: Complete distension and complete duration, no obstruction of flow Tongue base retraction: Trace column of contrast or air between tongue base and PPW Pharyngeal residue: Collection of residue within or on pharyngeal structures Location of pharyngeal residue: Valleculae  Esophageal Impairment Domain: No data recorded Pill: No data recorded Penetration/Aspiration Scale Score: Penetration/Aspiration Scale Score 1.  Material does not enter airway: Moderately thick liquids (Level 3, honey thick); Puree; Solid; Mildly thick liquids (Level 2, nectar thick) 8.  Material enters airway, passes BELOW cords without attempt by patient to eject out (silent aspiration) : Thin liquids (Level 0) Compensatory Strategies: Compensatory Strategies Compensatory strategies: Yes Straw: Ineffective Ineffective Straw: Thin liquid (Level  0)   General Information: Caregiver present: No   Diet Prior to this Study: NPO; Cortrak/Small bore NG tube   Temperature : Normal   Respiratory Status: WFL   Supplemental O2: Nasal cannula   History of Recent Intubation: Yes  Behavior/Cognition: Alert; Cooperative; Pleasant mood Self-Feeding Abilities: Able to self-feed Baseline vocal quality/speech: Dysphonic; Hypophonia/low volume Volitional Cough: Able to elicit Volitional Swallow: Able to elicit Exam Limitations: No limitations Goal Planning: Prognosis for improved oropharyngeal function: Good Barriers to Reach Goals: Time post onset No data recorded Patient/Family Stated Goal: wants to eat/drink Consulted and agree with results and recommendations: Patient; Nurse Pain: Pain Assessment Pain Assessment: Faces Faces Pain Scale: 0 Facial Expression: 0 Body Movements: 0 Muscle Tension: 0 Compliance with ventilator (intubated pts.): N/A Vocalization (extubated pts.): 0 CPOT Total: 0 Pain Location: pt reports back Pain Intervention(s): Monitored during session End of Session: Start Time:SLP Start Time (ACUTE ONLY): 1335 Stop Time: SLP Stop Time (ACUTE ONLY): 1352 Time Calculation:SLP Time Calculation (min) (ACUTE ONLY): 17 min Charges: SLP Evaluations $ SLP Speech Visit: 1 Visit SLP Evaluations $BSS Swallow: 1 Procedure $MBS Swallow: 1 Procedure SLP visit diagnosis: SLP Visit Diagnosis: Dysphagia, unspecified (R13.10) Past Medical History: Past Medical History: Diagnosis Date  Anemia   Arthritis 08/17/2020  Chronic back pain   Diabetes mellitus without complication (HCC)   type 2  GERD (gastroesophageal reflux disease)   Migraine   Ovarian cyst   Pneumonia   Polyneuropathy  Past Surgical History: Past Surgical History: Procedure Laterality Date  CESAREAN SECTION    CHOLECYSTECTOMY  2004  LUMBAR LAMINECTOMY N/A 05/30/2020  Procedure: Lumbar five Laminectomy,  Bilateral Microdiscectomy, Left Lumbar five -Sacral one Microdiscectomy;  Surgeon: [name], MD;  Location: [facility] OR;  Service: Orthopedics;  Laterality: N/A;   LUMBAR LAMINECTOMY Left 11/14/2020  Procedure: LEFT LUMBAR FOUR-FIVE MICRODISCECTOMY;  Surgeon: [name], MD;  Location: [facility] OR;  Service: Orthopedics;  Laterality: Left;  TUBAL LIGATION  10/19/2010 [name], M.A., CF-SLP Speech Language Pathology, Acute Rehabilitation Services Secure Chat preferred [phone] 05/20/2023, 3:14 PM      Assessment/Plan: Diagnosis: Debility due to hypoxic respiratory failure secondary to influenza pneumonia and asthma exacerbation Does the need for close, 24 hr/day medical supervision in concert with the patient's rehab needs make it unreasonable for this patient to be served in a less intensive setting? Yes Co-Morbidities requiring supervision/potential complications:  -Acute hypoxic respiratory failure, acute metabolic encephalopathy, AKI, hyponatremia, normocytic anemia, oropharyngeal dysphagia, hypertension, diabetes mellitus type 2, chronic pain, mood disorder, obesity Due to bladder management, bowel management, safety, skin/wound care, disease management, medication administration, pain management, and patient education, does the patient require 24 hr/day rehab nursing? Yes Does the patient require coordinated care of a physician, rehab nurse, therapy disciplines of PT, OT, SLP to address physical and functional deficits in the context of the above medical diagnosis(es)? Yes Addressing deficits in the following areas: balance, endurance, locomotion, strength, transferring, bowel/bladder control, bathing, dressing, feeding, grooming, toileting, cognition, swallowing, and psychosocial support Can the patient actively participate in an intensive therapy program of at least 3 hrs of therapy per day at least 5 days per week? Yes The potential for patient to make measurable gains while on inpatient rehab is excellent Anticipated functional outcomes upon discharge from inpatient rehab are supervision  with PT, supervision with OT, modified independent with  SLP. Estimated rehab length of stay to reach the above functional goals is: 10-12 Anticipated discharge destination: Home Overall Rehab/Functional Prognosis: excellent  POST ACUTE RECOMMENDATIONS: This patient's condition is appropriate for continued rehabilitative care in the following setting: CIR Patient has agreed to participate in recommended program. Yes Note that insurance prior authorization may be required for reimbursement for recommended care.   Comment: I think she be a good candidate for CIR, rehab coordinator to follow-up     MEDICAL RECOMMENDATIONS: Patient with orthostatic hypotension.,  Consider compression stockings, abdominal binder, encouraging hydration.   I have personally performed a face to face diagnostic evaluation of this patient. Additionally, I have examined the patient's medical record including any pertinent labs and radiographic images.     Thanks,   Fanny Dance, MD 05/21/2023

## 2023-05-21 NOTE — Plan of Care (Signed)
 Pt has rested quietly throughout the night with no distress noted. Alert and oriented. On room air. SR on the monitor. Up to [facility] to have BM and to void. On menses. Medicated for pain with relief noted. Medicated for nausea with some relief noted. Unable to get a feed pump until 0300. Pt did not want to start tube feeds at that time. Stated her stomach was still a little queasy. Cortrack intact and clamped. No other complaints voiced.     Problem: Education: Goal: Knowledge of General Education information will improve Description: Including pain rating scale, medication(s)/side effects and non-pharmacologic comfort measures Outcome: Progressing   Problem: Clinical Measurements: Goal: Respiratory complications will improve Outcome: Progressing   Problem: Nutrition: Goal: Adequate nutrition will be maintained Outcome: Progressing   Problem: Elimination: Goal: Will not experience complications related to bowel motility Outcome: Progressing Goal: Will not experience complications related to urinary retention Outcome: Progressing   Problem: Pain Managment: Goal: General experience of comfort will improve and/or be controlled Outcome: Progressing   Problem: Skin Integrity: Goal: Risk for impaired skin integrity will decrease Outcome: Progressing   Problem: Activity: Goal: Ability to tolerate increased activity will improve Outcome: Progressing

## 2023-05-21 NOTE — Plan of Care (Signed)
  Problem: Education: Goal: Ability to describe self-care measures that may prevent or decrease complications (Diabetes Survival Skills Education) will improve Outcome: Progressing Goal: Individualized Educational Video(s) Outcome: Progressing   Problem: Coping: Goal: Ability to adjust to condition or change in health will improve Outcome: Progressing   Problem: Fluid Volume: Goal: Ability to maintain a balanced intake and output will improve Outcome: Progressing   Problem: Health Behavior/Discharge Planning: Goal: Ability to identify and utilize available resources and services will improve Outcome: Progressing Goal: Ability to manage health-related needs will improve Outcome: Progressing   Problem: Metabolic: Goal: Ability to maintain appropriate glucose levels will improve Outcome: Progressing   Problem: Nutritional: Goal: Maintenance of adequate nutrition will improve Outcome: Progressing Goal: Progress toward achieving an optimal weight will improve Outcome: Progressing   Problem: Skin Integrity: Goal: Risk for impaired skin integrity will decrease Outcome: Progressing   Problem: Tissue Perfusion: Goal: Adequacy of tissue perfusion will improve Outcome: Progressing   Problem: Education: Goal: Knowledge of General Education information will improve Description: Including pain rating scale, medication(s)/side effects and non-pharmacologic comfort measures Outcome: Progressing   Problem: Health Behavior/Discharge Planning: Goal: Ability to manage health-related needs will improve Outcome: Progressing   Problem: Clinical Measurements: Goal: Ability to maintain clinical measurements within normal limits will improve Outcome: Progressing Goal: Will remain free from infection Outcome: Progressing Goal: Diagnostic test results will improve Outcome: Progressing Goal: Respiratory complications will improve Outcome: Progressing Goal: Cardiovascular complication will  be avoided Outcome: Progressing   Problem: Activity: Goal: Risk for activity intolerance will decrease Outcome: Progressing   Problem: Nutrition: Goal: Adequate nutrition will be maintained Outcome: Progressing   Problem: Coping: Goal: Level of anxiety will decrease Outcome: Progressing   Problem: Elimination: Goal: Will not experience complications related to bowel motility Outcome: Progressing Goal: Will not experience complications related to urinary retention Outcome: Progressing   Problem: Pain Managment: Goal: General experience of comfort will improve and/or be controlled Outcome: Progressing   Problem: Safety: Goal: Ability to remain free from injury will improve Outcome: Progressing   Problem: Skin Integrity: Goal: Risk for impaired skin integrity will decrease Outcome: Progressing   Problem: Activity: Goal: Ability to tolerate increased activity will improve Outcome: Progressing   Problem: Respiratory: Goal: Ability to maintain a clear airway and adequate ventilation will improve Outcome: Progressing   Problem: Role Relationship: Goal: Method of communication will improve Outcome: Progressing

## 2023-05-22 ENCOUNTER — Ambulatory Visit: Payer: 59 | Admitting: Primary Care

## 2023-05-22 ENCOUNTER — Encounter: Payer: Self-pay | Admitting: Oncology

## 2023-05-22 DIAGNOSIS — J101 Influenza due to other identified influenza virus with other respiratory manifestations: Secondary | ICD-10-CM | POA: Diagnosis not present

## 2023-05-22 DIAGNOSIS — J189 Pneumonia, unspecified organism: Secondary | ICD-10-CM | POA: Diagnosis not present

## 2023-05-22 DIAGNOSIS — J9601 Acute respiratory failure with hypoxia: Secondary | ICD-10-CM | POA: Diagnosis not present

## 2023-05-22 LAB — GLUCOSE, CAPILLARY
Glucose-Capillary: 130 mg/dL — ABNORMAL HIGH (ref 70–99)
Glucose-Capillary: 141 mg/dL — ABNORMAL HIGH (ref 70–99)
Glucose-Capillary: 164 mg/dL — ABNORMAL HIGH (ref 70–99)
Glucose-Capillary: 239 mg/dL — ABNORMAL HIGH (ref 70–99)
Glucose-Capillary: 248 mg/dL — ABNORMAL HIGH (ref 70–99)
Glucose-Capillary: 80 mg/dL (ref 70–99)

## 2023-05-22 MED ORDER — METOPROLOL TARTRATE 25 MG PO TABS
25.0000 mg | ORAL_TABLET | Freq: Two times a day (BID) | ORAL | Status: DC
  Administered 2023-05-22 – 2023-05-23 (×3): 25 mg via ORAL
  Filled 2023-05-22 (×3): qty 1

## 2023-05-22 MED ORDER — AMITRIPTYLINE HCL 25 MG PO TABS
25.0000 mg | ORAL_TABLET | Freq: Every day | ORAL | Status: DC
  Administered 2023-05-22: 25 mg via ORAL
  Filled 2023-05-22 (×2): qty 1

## 2023-05-22 MED ORDER — POLYETHYLENE GLYCOL 3350 17 G PO PACK: 17.0000 g | PACK | Freq: Every day | ORAL | Status: DC

## 2023-05-22 MED ORDER — GABAPENTIN 400 MG PO CAPS
400.0000 mg | ORAL_CAPSULE | Freq: Three times a day (TID) | ORAL | Status: DC
  Administered 2023-05-22 – 2023-05-23 (×5): 400 mg via ORAL
  Filled 2023-05-22 (×5): qty 1

## 2023-05-22 MED ORDER — DOCUSATE SODIUM 100 MG PO CAPS
100.0000 mg | ORAL_CAPSULE | Freq: Two times a day (BID) | ORAL | Status: DC
Start: 2023-05-22 — End: 2023-05-23
  Administered 2023-05-22 (×2): 100 mg via ORAL
  Filled 2023-05-22 (×3): qty 1

## 2023-05-22 MED ORDER — VITAMIN B-12 100 MCG PO TABS
100.0000 ug | ORAL_TABLET | Freq: Every day | ORAL | Status: DC
  Administered 2023-05-22 – 2023-05-23 (×2): 100 ug via ORAL
  Filled 2023-05-22 (×2): qty 1

## 2023-05-22 MED ORDER — ADULT MULTIVITAMIN W/MINERALS CH
1.0000 | ORAL_TABLET | Freq: Every day | ORAL | Status: DC
  Administered 2023-05-22 – 2023-05-23 (×2): 1 via ORAL
  Filled 2023-05-22 (×2): qty 1

## 2023-05-22 MED ORDER — PANTOPRAZOLE SODIUM 40 MG PO TBEC
40.0000 mg | DELAYED_RELEASE_TABLET | Freq: Every day | ORAL | Status: DC
  Administered 2023-05-22 – 2023-05-23 (×2): 40 mg via ORAL
  Filled 2023-05-22 (×2): qty 1

## 2023-05-22 MED ORDER — ACETAMINOPHEN 325 MG PO TABS: 650.0000 mg | ORAL_TABLET | Freq: Four times a day (QID) | ORAL | Status: DC | PRN

## 2023-05-22 MED ORDER — OXYCODONE HCL 5 MG PO TABS: 10.0000 mg | ORAL_TABLET | Freq: Four times a day (QID) | ORAL | Status: DC | PRN

## 2023-05-22 NOTE — Progress Notes (Signed)
  Inpatient Rehabilitation Admissions Coordinator   I have received insurance approval for CIR. I am hopeful for a bed to be available in the next 24 to 48 hrs.  Ottie Glazier, RN, MSN Rehab Admissions Coordinator [phone] 05/22/2023 4:44 PM

## 2023-05-22 NOTE — TOC Progression Note (Signed)
 Transition of Care [facility]) - Progression Note    Patient Details  Name: Melinda Stone MRN: [id number] Date of Birth: [birth year]  Transition of Care [facility] CM/SW Contact  Mearl Latin, LCSW Phone Number: 05/22/2023, 8:31 AM  Clinical Narrative:    CSW following for CIR determination of candidacy.    Expected Discharge Plan:  (TBD) Barriers to Discharge: Continued Medical Work up  Expected Discharge Plan and Services   Discharge Planning Services: CM Consult   Living arrangements for the past 2 months: Apartment                                       Social Determinants of Health (SDOH) Interventions SDOH Screenings   Food Insecurity: Patient Unable To Answer (05/15/2023)  Housing: Patient Unable To Answer (05/15/2023)  Transportation Needs: Patient Unable To Answer (05/15/2023)  Utilities: Patient Unable To Answer (05/15/2023)  Depression (PHQ2-9): High Risk (05/30/2022)  Social Connections: Socially Isolated (03/26/2023)  Tobacco Use: High Risk (05/11/2023)    Readmission Risk Interventions    03/27/2023   11:25 AM  Readmission Risk Prevention Plan  Transportation Screening Complete  Medication Review (RN Care Manager) Referral to Pharmacy  PCP or Specialist appointment within 3-5 days of discharge Complete  HRI or Home Care Consult Not Complete  HRI or Home Care Consult Pt Refusal Comments Not applicable for [facility].  SW Recovery Care/Counseling Consult Complete  Palliative Care Screening Not Applicable  Skilled Nursing Facility Not Applicable

## 2023-05-22 NOTE — Progress Notes (Signed)
 PROGRESS NOTE        PATIENT DETAILS Name: Melinda Stone Age: 43 y.o. Sex: female Date of Birth: 28-Jan-1981 Admit Date: 05/11/2023 Admitting Physician Janann Colonel, MD [id number], Corrie Dandy, NP  Brief Summary: Patient is a 43 y.o.  female with history of asthma, DM-2, chronic pain syndrome, mood disorder-who presented to the ED with fever/chills/myalgias/nausea/vomiting (son recently positive for influenza)-patient was found to have influenza-and pneumonia on imaging studies-patient was started on antibiotic therapy and admitted to the hospitalist service-however post admission patient started developing shortness of breath-and on 2/23 developed worsening hypoxemia requiring intubation and transferred to the ICU.  She was stabilized in the ICU-extubated-postextubation-she required BiPAP for a few days-found to have ICU delirium and oropharyngeal dysphagia requiring NG tube feeding.  She was stabilized and transferred to [facility] on 3/3.   Significant events: 2/22>> subjective fever/chills/myalgias-influenza A positive-pneumonia on imaging-admit to [facility] 2/23>> worsening hypoxemia-transferred to ICU-intubated. 2/28>> extubated-but requiring BiPAP.Cortrak placed 3/03>> transferred to [facility].  Modified barium swallow-started on regular diet by SLP 3/04>> Cortrak removed 3/5>> adequate oral intake.   Significant studies: 2/22>> CTA chest: No PE-multifocal PNA-right lung predominant. 2/22>> MRI brain: No acute intracranial process. 2/23>> RUQ ultrasound: Chronic hepatomegaly-s/p cholecystectomy-unremarkable appearance otherwise. 2/24>> echo: EF 65-70%, grade 1 diastolic dysfunction 3/01>> CT head: No acute findings.  Significant microbiology data: 2/22>> influenza A by PCR: Positive 2/22>> COVID/influenza B/RSV PCR: Negative 2/22>> respiratory virus panel: Negative 2/23>> sputum culture: Negative 2/23>> blood culture: Negative 2/25>> tracheal aspirate  culture: Negative  Procedures: As above  Consults: PCCM  Subjective:  Denies any complaints . Looking forward to go to Rehab.  Denies any shortness of breath.  She is generalized weak otherwise no other complaints. Able to swallow without issues.  Objective: Vitals: Blood pressure 123/76, pulse 87, temperature 98 F (36.7 C), temperature source Oral, resp. rate 17, height 5\' 9"  (1.753 m), weight 95.3 kg, SpO2 96%.   General: Looks fairly comfortable.  On room air.  Sitting up in the bed. Cardiovascular: S1-S2 normal.  Regular rate rhythm. Respiratory: Bilateral clear.  No added sounds. Gastrointestinal: Soft.  Nontender.  Bowel sound positive. Ext: No edema or cyanosis.  No swelling. Neuro: Alert awake and oriented.  Moves all extremities equally. Musculoskeletal: No deformities.   Pertinent Labs/Radiology:    Latest Ref Rng & Units 05/21/2023    9:09 AM 05/20/2023    8:52 AM 05/19/2023    4:31 AM  CBC  WBC 4.0 - 10.5 K/uL 8.7  6.4  5.1   Hemoglobin 12.0 - 15.0 g/dL 7.8  8.6  7.1   Hematocrit 36.0 - 46.0 % 24.4  27.8  23.5   Platelets 150 - 400 K/uL 304  298  231     Lab Results  Component Value Date   NA 144 05/21/2023   K 3.3 (L) 05/21/2023   CL 110 05/21/2023   CO2 25 05/21/2023      Assessment/Plan: Acute hypoxic respiratory failure secondary to influenza pneumonia/bacterial pneumonia and asthma exacerbation Required ETT from 2/23 to 2/28-then was on BiPAP for several days Currently much improved and on room air Has completed a course of steroids/Tamiflu/antibiotics Continue bronchodilators and pulmonary tolerating Continue to mobilize.  Acute metabolic encephalopathy Initially secondary to hypoxia/PNA-then developed ICU delirium Currently much improved-and awake/alert/following all commands Neuroimaging negative Delirium precautions Improved.  AKI  Likely secondary to ischemic ATN  Overall improved-creatinine has down trended Avoid nephrotoxic agents and  follow closely.  Hypernatremia Secondary to free water deficit Resolved with free water through NG tube Sodium levels have normalized-encourage oral intake and follow electrolytes  Normocytic anemia Likely secondary to acute illness-in a background of known iron deficiency anemia S/p 4 units of PRBC this hospitalization-last transfused on 2/26 Hb stable-follow.  Oropharyngeal dysphagia Related to intubation/critical illness/debility/deconditioning Rapidly improving. Started on regular diet following MBS on 3/3 NG tube removed. Marland Kitchen  HTN BP stable-continue metoprolol.  DM-2 (A1c 5.0 on 2/22) Hypoglycemic event 3/4 morning-no longer on tube feeds Change Lantus to 30 units daily (was on twice daily) Switch to Premeal SSI Will follow CBGs and make changes accordingly.  Recent Labs    05/21/23 2337 05/22/23 0350 05/22/23 0935  GLUCAP 108* 80 130*    Chronic pain syndrome/mood disorder Continue Percocet Continue gabapentin at half of home dose Resume amitriptyline. Xanax can be held as she has not used it for last 2 weeks.   Critical illness and debility/deconditioning PT/OT eval-CIR recommended. Waiting for approval.  Nutrition Status: Nutrition Problem: Inadequate oral intake Etiology: inability to eat Signs/Symptoms: NPO status Interventions: Tube feeding    Class 1 Obesity: Estimated body mass index is 31.03 kg/m as calculated from the following:   Height as of this encounter: 5\' 9"  (1.753 m).   Weight as of this encounter: 95.3 kg.   Code status:   Code Status: Full Code   DVT Prophylaxis: heparin injection 5,000 Units Start: 05/14/23 2200 Place and maintain sequential compression device Start: 05/11/23 1225   Family Communication: none today.    Disposition Plan: Status is: Inpatient Remains inpatient appropriate because: medically stable. Transfer to CIR when bed available.    Planned Discharge Destination:Rehabilitation facility   Diet: Diet  Order             Diet Carb Modified Fluid consistency: Nectar Thick; Room service appropriate? Yes  Diet effective now                     Antimicrobial agents: Anti-infectives (From admission, onward)    Start     Dose/Rate Route Frequency Ordered Stop   05/14/23 1000  oseltamivir (TAMIFLU) capsule 30 mg        30 mg Per Tube Daily 05/13/23 1446 05/16/23 0940   05/12/23 2200  oseltamivir (TAMIFLU) capsule 75 mg  Status:  Discontinued        75 mg Per Tube 2 times daily 05/12/23 1600 05/13/23 1446   05/12/23 1500  cefTRIAXone (ROCEPHIN) 2 g in sodium chloride 0.9 % 100 mL IVPB  Status:  Discontinued        2 g 200 mL/hr over 30 Minutes Intravenous Every 24 hours 05/11/23 1617 05/11/23 2109   05/12/23 1500  azithromycin (ZITHROMAX) 500 mg in sodium chloride 0.9 % 250 mL IVPB  Status:  Discontinued        500 mg 250 mL/hr over 60 Minutes Intravenous Every 24 hours 05/11/23 1617 05/17/23 0923   05/12/23 1300  vancomycin (VANCOREADY) IVPB 1500 mg/300 mL        1,500 mg 150 mL/hr over 120 Minutes Intravenous  Once 05/12/23 1203 05/12/23 1700   05/12/23 1245  vancomycin (VANCOREADY) IVPB 1750 mg/350 mL  Status:  Discontinued        1,750 mg 175 mL/hr over 120 Minutes Intravenous  Once 05/12/23 1148 05/12/23 1203   05/12/23 1149  vancomycin variable dose per unstable renal function (pharmacist  dosing)  Status:  Discontinued         Does not apply See admin instructions 05/12/23 1149 05/13/23 1445   05/12/23 0830  cefTRIAXone (ROCEPHIN) 2 g in sodium chloride 0.9 % 100 mL IVPB        2 g 200 mL/hr over 30 Minutes Intravenous Daily 05/12/23 0751 05/17/23 1618   05/11/23 2215  oseltamivir (TAMIFLU) capsule 75 mg  Status:  Discontinued        75 mg Oral 2 times daily 05/11/23 2120 05/11/23 2121   05/11/23 2215  oseltamivir (TAMIFLU) capsule 75 mg  Status:  Discontinued        75 mg Oral 2 times daily 05/11/23 2123 05/12/23 1600   05/11/23 2200  ceFEPIme (MAXIPIME) 2 g in sodium  chloride 0.9 % 100 mL IVPB  Status:  Discontinued        2 g 200 mL/hr over 30 Minutes Intravenous Every 8 hours 05/11/23 2109 05/12/23 0751   05/11/23 2200  vancomycin (VANCOREADY) IVPB 2000 mg/400 mL  Status:  Discontinued        2,000 mg 200 mL/hr over 120 Minutes Intravenous  Once 05/11/23 2109 05/11/23 2118   05/11/23 2108  vancomycin variable dose per unstable renal function (pharmacist dosing)  Status:  Discontinued         Does not apply See admin instructions 05/11/23 2109 05/11/23 2118   05/11/23 1015  cefTRIAXone (ROCEPHIN) 1 g in sodium chloride 0.9 % 100 mL IVPB        1 g 200 mL/hr over 30 Minutes Intravenous  Once 05/11/23 1000 05/11/23 1539   05/11/23 1015  azithromycin (ZITHROMAX) 500 mg in sodium chloride 0.9 % 250 mL IVPB        500 mg 250 mL/hr over 60 Minutes Intravenous  Once 05/11/23 1000 05/11/23 1657        MEDICATIONS: Scheduled Meds:  arformoterol  15 mcg Nebulization BID   budesonide (PULMICORT) nebulizer solution  0.5 mg Nebulization BID   Chlorhexidine Gluconate Cloth  6 each Topical Daily   docusate sodium  100 mg Oral BID   gabapentin  400 mg Oral Q8H   heparin injection (subcutaneous)  5,000 Units Subcutaneous Q8H   insulin aspart  0-9 Units Subcutaneous TID WC   insulin glargine  30 Units Subcutaneous Daily   metoprolol tartrate  25 mg Oral BID   multivitamin with minerals  1 tablet Oral Daily   pantoprazole  40 mg Oral Daily   polyethylene glycol  17 g Oral Daily   revefenacin  175 mcg Nebulization Daily   sodium chloride flush  3 mL Intravenous Q12H   vitamin B-12  100 mcg Oral Daily   Continuous Infusions: PRN Meds:.acetaminophen, acetaminophen, cyclobenzaprine, hydrALAZINE, ondansetron (ZOFRAN) IV, oxyCODONE, polyvinyl alcohol, prochlorperazine   I have personally reviewed following labs and imaging studies  LABORATORY DATA: CBC: Recent Labs  Lab 05/17/23 0343 05/18/23 0349 05/18/23 0749 05/18/23 0755 05/19/23 0431  05/20/23 0852 05/21/23 0909  WBC 6.3 5.5  --   --  5.1 6.4 8.7  HGB 7.5* 7.3* 6.5* 7.2* 7.1* 8.6* 7.8*  HCT 23.6* 23.9* 19.0* 23.4* 23.5* 27.8* 24.4*  MCV 86.4 88.2  --   --  87.7 86.9 83.0  PLT 132* 184  --   --  231 298 304    Basic Metabolic Panel: Recent Labs  Lab 05/16/23 0400 05/16/23 0401 05/17/23 0343 05/18/23 0349 05/18/23 0749 05/19/23 0431 05/20/23 0852 05/21/23 0440  NA 148*   < >  150* 152* 155* 153* 151* 144  K 4.3   < > 4.5 4.1 3.9 3.8 4.0 3.3*  CL 116*  --  117* 114*  --  117* 114* 110  CO2 20*  --  26 28  --  33* 25 25  GLUCOSE 199*  --  204* 210*  --  230* 109* 60*  BUN 56*  --  49* 48*  --  49* 56* 62*  CREATININE 2.62*  --  1.83* 1.51*  --  1.24* 1.22* 1.47*  CALCIUM 8.6*  --  8.7* 8.5*  --  7.8* 8.5* 8.2*  MG 2.5*  --   --   --   --  2.0 2.1 2.0  PHOS 4.3  --   --   --   --   --  3.6 4.9*   < > = values in this interval not displayed.    GFR: Estimated Creatinine Clearance: 61.9 mL/min (A) (by C-G formula based on SCr of 1.47 mg/dL (H)).  Liver Function Tests: Recent Labs  Lab 05/16/23 0400 05/20/23 0852 05/21/23 0440  AST 28  --   --   ALT 21  --   --   ALKPHOS 144*  --   --   BILITOT 0.7  --   --   PROT 5.6*  --   --   ALBUMIN 2.1* 2.6* 2.3*   No results for input(s): "LIPASE", "AMYLASE" in the last 168 hours. No results for input(s): "AMMONIA" in the last 168 hours.  Coagulation Profile: No results for input(s): "INR", "PROTIME" in the last 168 hours.  Cardiac Enzymes: No results for input(s): "CKTOTAL", "CKMB", "CKMBINDEX", "TROPONINI" in the last 168 hours.  BNP (last 3 results) No results for input(s): "PROBNP" in the last 8760 hours.  Lipid Profile: No results for input(s): "CHOL", "HDL", "LDLCALC", "TRIG", "CHOLHDL", "LDLDIRECT" in the last 72 hours.  Thyroid Function Tests: No results for input(s): "TSH", "T4TOTAL", "FREET4", "T3FREE", "THYROIDAB" in the last 72 hours.  Anemia Panel: No results for input(s):  "VITAMINB12", "FOLATE", "FERRITIN", "TIBC", "IRON", "RETICCTPCT" in the last 72 hours.  Urine analysis:    Component Value Date/Time   COLORURINE AMBER (A) 05/11/2023 0612   APPEARANCEUR HAZY (A) 05/11/2023 0612   LABSPEC 1.015 05/11/2023 0612   PHURINE 5.0 05/11/2023 0612   GLUCOSEU 50 (A) 05/11/2023 0612   HGBUR LARGE (A) 05/11/2023 0612   BILIRUBINUR NEGATIVE 05/11/2023 0612   BILIRUBINUR small (A) 02/04/2023 1027   KETONESUR NEGATIVE 05/11/2023 0612   PROTEINUR >=300 (A) 05/11/2023 0612   UROBILINOGEN 0.2 02/04/2023 1027   NITRITE NEGATIVE 05/11/2023 0612   LEUKOCYTESUR NEGATIVE 05/11/2023 0612    Sepsis Labs: Lactic Acid, Venous    Component Value Date/Time   LATICACIDVEN 1.7 05/12/2023 0152    MICROBIOLOGY: Recent Results (from the past 240 hours)  MRSA Next Gen by PCR, Nasal     Status: None   Collection Time: 05/13/23  8:54 AM   Specimen: Nasal Mucosa; Nasal Swab  Result Value Ref Range Status   MRSA by PCR Next Gen NOT DETECTED NOT DETECTED Final    Comment: (NOTE) The GeneXpert MRSA Assay (FDA approved for NASAL specimens only), is one component of a comprehensive MRSA colonization surveillance program. It is not intended to diagnose MRSA infection nor to guide or monitor treatment for MRSA infections. Test performance is not FDA approved in patients less than 59 years old. Performed at [facility], [address], [place], [place] [address]   Culture,  Respiratory w Gram Stain     Status: None   Collection Time: 05/14/23  3:59 PM   Specimen: Tracheal Aspirate; Respiratory  Result Value Ref Range Status   Specimen Description TRACHEAL ASPIRATE  Final   Special Requests NONE  Final   Gram Stain NO WBC SEEN NO ORGANISMS SEEN   Final   Culture   Final    NO GROWTH 2 DAYS Performed at [facility], [address], [place], [place] [address]    Report Status 05/17/2023 FINAL  Final    RADIOLOGY STUDIES/RESULTS: DG Swallowing Func-Speech  Pathology Result Date: 05/20/2023 Table formatting from the original result was not included. Modified Barium Swallow Study Patient Details Name: LABRINA LINES MRN: [id number] Date of Birth: 03-Oct-1980 Today's Date: 05/20/2023 HPI/PMH: HPI: DEBORRAH MABIN is a 43 yo female presenting to ED 2/22 with complaints of nausea, vomiting, abdominal pain, and myalgias. Found to be Influenza A positive. CTA showed R predominant consolidative and nodular opacities. ETT 2/23-2/28. 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 Boluses progress posteriorly to the level of the pyriform sinuses prior to swallow initiation, which intermittently allows thin liquids to enter her laryngeal vestibule. At times, they are immediately ejected by complete laryngeal elevation but other times, they progress quickly below the level of the vocal folds without being sensed (PAS 8). No further penetration or aspiration was observed with subsequent trials of nectar and honey thick liquids, purees, or solids. She initiates efficient mastication with complete oral clearance. Pt does have a collection of vallecular residue with solids specifically, but a liquid wash was effective in clearing this. Recommend initiating diet of regular solids with nectar thick liquids. SLP will f/u to trial RMST and compensatory strategies to upgrade diet as clinically indicated. DIGEST Swallow Severity Rating*  Safety: 2  Efficiency: 2  Overall Pharyngeal Swallow Severity: 2 (moderate) 1: mild; 2: moderate; 3: severe; 4: profound *The Dynamic Imaging Grade of Swallowing Toxicity is standardized for the head and neck cancer population, however, demonstrates promising clinical  applications across populations to standardize the clinical rating of pharyngeal swallow safety and severity. Factors that may increase risk of adverse event in presence of aspiration [name] & Clearance [name] 2021): Factors that may increase risk of adverse event in presence of aspiration [name] & Clearance [name] 2021): Frail or deconditioned; Weak cough; Presence of tubes (ETT, trach, NG, etc.) Recommendations/Plan: Swallowing Evaluation Recommendations Swallowing Evaluation Recommendations Recommendations: PO diet PO Diet Recommendation: Regular; Mildly thick liquids (Level 2, nectar thick) Liquid Administration via: Cup; Straw Medication Administration: Whole meds with puree Supervision: Patient able to self-feed; Full supervision/cueing for swallowing strategies Swallowing strategies  : Minimize environmental distractions; Slow rate; Small bites/sips Postural changes: Position pt fully upright for meals Oral care recommendations: Oral care BID (2x/day) Caregiver Recommendations: Avoid jello, ice cream, thin soups, popsicles; Remove water pitcher Treatment Plan Treatment Plan Treatment recommendations: Therapy as outlined in treatment plan below Follow-up recommendations: Acute inpatient rehab (3 hours/day) Functional status assessment: Patient has had a recent decline in their functional status and demonstrates the ability to make significant improvements in function in a reasonable and predictable amount of time. Treatment frequency: Min 2x/week Treatment duration: 2 weeks Interventions: Aspiration precaution training; Compensatory techniques; Patient/family education; Trials of upgraded texture/liquids; Diet toleration management by  SLP; Respiratory muscle strength training Recommendations Recommendations for follow up therapy are one component of a multi-disciplinary discharge planning process, led by the attending physician.  Recommendations may be updated based on patient status, additional functional criteria and  insurance authorization. Assessment: Orofacial Exam: Orofacial Exam Oral Cavity: Oral Hygiene: WFL Oral Cavity - Dentition: Adequate natural dentition Orofacial Anatomy: WFL Oral Motor/Sensory Function: WFL Anatomy: Anatomy: Suspected cervical osteophytes Boluses Administered: Boluses Administered Boluses Administered: Thin liquids (Level 0); Mildly thick liquids (Level 2, nectar thick); Moderately thick liquids (Level 3, honey thick); Puree; Solid  Oral Impairment Domain: Oral Impairment Domain Lip Closure: No labial escape Tongue control during bolus hold: Posterior escape of less than half of bolus Bolus preparation/mastication: Timely and efficient chewing and mashing Bolus transport/lingual motion: Brisk tongue motion Oral residue: Complete oral clearance Location of oral residue : N/A Initiation of pharyngeal swallow : Pyriform sinuses  Pharyngeal Impairment Domain: Pharyngeal Impairment Domain Soft palate elevation: No bolus between soft palate (SP)/pharyngeal wall (PW) Laryngeal elevation: Complete superior movement of thyroid cartilage with complete approximation of arytenoids to epiglottic petiole Anterior hyoid excursion: Complete anterior movement Epiglottic movement: Complete inversion Laryngeal vestibule closure: Complete, no air/contrast in laryngeal vestibule Pharyngeal stripping wave : Present - complete Pharyngeal contraction (A/P view only): N/A Pharyngoesophageal segment opening: Complete distension and complete duration, no obstruction of flow Tongue base retraction: Trace column of contrast or air between tongue base and PPW Pharyngeal residue: Collection of residue within or on pharyngeal structures Location of pharyngeal residue: Valleculae  Esophageal Impairment Domain: No data recorded Pill: No data recorded Penetration/Aspiration Scale Score: Penetration/Aspiration Scale Score 1.  Material does not enter airway: Moderately thick liquids (Level 3, honey thick); Puree; Solid; Mildly thick  liquids (Level 2, nectar thick) 8.  Material enters airway, passes BELOW cords without attempt by patient to eject out (silent aspiration) : Thin liquids (Level 0) Compensatory Strategies: Compensatory Strategies Compensatory strategies: Yes Straw: Ineffective Ineffective Straw: Thin liquid (Level 0)   General Information: Caregiver present: No  Diet Prior to this Study: NPO; Cortrak/Small bore NG tube   Temperature : Normal   Respiratory Status: WFL   Supplemental O2: Nasal cannula   History of Recent Intubation: Yes  Behavior/Cognition: Alert; Cooperative; Pleasant mood Self-Feeding Abilities: Able to self-feed Baseline vocal quality/speech: Dysphonic; Hypophonia/low volume Volitional Cough: Able to elicit Volitional Swallow: Able to elicit Exam Limitations: No limitations Goal Planning: Prognosis for improved oropharyngeal function: Good Barriers to Reach Goals: Time post onset No data recorded Patient/Family Stated Goal: wants to eat/drink Consulted and agree with results and recommendations: Patient; Nurse Pain: Pain Assessment Pain Assessment: Faces Faces Pain Scale: 0 Facial Expression: 0 Body Movements: 0 Muscle Tension: 0 Compliance with ventilator (intubated pts.): N/A Vocalization (extubated pts.): 0 CPOT Total: 0 Pain Location: pt reports back Pain Intervention(s): Monitored during session End of Session: Start Time:SLP Start Time (ACUTE ONLY): 1335 Stop Time: SLP Stop Time (ACUTE ONLY): 1352 Time Calculation:SLP Time Calculation (min) (ACUTE ONLY): 17 min Charges: SLP Evaluations $ SLP Speech Visit: 1 Visit SLP Evaluations $BSS Swallow: 1 Procedure $MBS Swallow: 1 Procedure SLP visit diagnosis: SLP Visit Diagnosis: Dysphagia, unspecified (R13.10) Past Medical History: Past Medical History: Diagnosis Date  Anemia   Arthritis 08/17/2020  Chronic back pain   Diabetes mellitus without complication (HCC)   type 2  GERD (gastroesophageal reflux disease)   Migraine   Ovarian cyst   Pneumonia   Polyneuropathy   Past Surgical History: Past Surgical History: Procedure Laterality Date  CESAREAN  SECTION    CHOLECYSTECTOMY  2004  LUMBAR LAMINECTOMY N/A 05/30/2020  Procedure: Lumbar five Laminectomy,  Bilateral Microdiscectomy, Left Lumbar five -Sacral one Microdiscectomy;  Surgeon: [name], MD;  Location: [facility] OR;  Service: Orthopedics;  Laterality: N/A;  LUMBAR LAMINECTOMY Left 11/14/2020  Procedure: LEFT LUMBAR FOUR-FIVE MICRODISCECTOMY;  Surgeon: [name], MD;  Location: [facility] OR;  Service: Orthopedics;  Laterality: Left;  TUBAL LIGATION  10/19/2010 [name], M.A., CF-SLP Speech Language Pathology, Acute Rehabilitation Services Secure Chat preferred [phone] 05/20/2023, 3:14 PM    LOS: 11 days   Dorcas Carrow, MD  [facility]    05/22/2023, 10:37 AM

## 2023-05-22 NOTE — Progress Notes (Signed)
 Occupational Therapy Treatment Patient Details Name: Melinda Stone MRN: [id number] DOB: [birth year] Today's Date: 05/22/2023   History of present illness 43 year old F presented to ED on 2/22 with nausea, vomiting, and abdominal pain. Pt went into acute respiratory failure with hypoxia due to flu A and asthma exacerbation requiring intubation on 2/22. Pt extubated on 2/28. PMH: mild intermittent asthma, IDDM-2, IDA, GERD, chronic pain and mood disorder   OT comments  Pt progressing toward goals this session, in chair upon arrival and able to ambulate to sink for grooming tasks. Pt set up for washing face in standing, then able to ambulate hallway distance with close chair follow and RW. Pt more alert this session, following commands consistently. Pt presenting with impairments listed below, will follow acutely. Patient will benefit from intensive inpatient follow-up therapy, >3 hours/day to maximize safety/ind with ADL/functional mobility.       If plan is discharge home, recommend the following:  A lot of help with walking and/or transfers;A lot of help with bathing/dressing/bathroom;Assistance with cooking/housework;Direct supervision/assist for financial management;Direct supervision/assist for medications management;Assist for transportation;Help with stairs or ramp for entrance;Supervision due to cognitive status   Equipment Recommendations  Other (comment) (defer)    Recommendations for Other Services PT consult;Rehab consult    Precautions / Restrictions Precautions Precautions: Fall Restrictions Weight Bearing Restrictions Per Provider Order: No       Mobility Bed Mobility               General bed mobility comments: In recliner    Transfers Overall transfer level: Needs assistance Equipment used: Rolling walker (2 wheels) Transfers: Sit to/from Stand Sit to Stand: Min assist                 Balance Overall balance assessment: Needs  assistance Sitting-balance support: Feet supported, No upper extremity supported Sitting balance-Leahy Scale: Good     Standing balance support: Bilateral upper extremity supported, During functional activity, Reliant on assistive device for balance Standing balance-Leahy Scale: Poor                             ADL either performed or assessed with clinical judgement   ADL Overall ADL's : Needs assistance/impaired     Grooming: Set up;Wash/dry face;Standing           Upper Body Dressing : Minimal assistance;Sitting Upper Body Dressing Details (indicate cue type and reason): donning gown on backside     Toilet Transfer: Contact guard assist;Ambulation;Regular Toilet;Rolling walker (2 wheels)           Functional mobility during ADLs: Contact guard assist;Rolling walker (2 wheels)      Extremity/Trunk Assessment Upper Extremity Assessment Upper Extremity Assessment: Generalized weakness   Lower Extremity Assessment Lower Extremity Assessment: Generalized weakness        Vision   Vision Assessment?: No apparent visual deficits   Perception Perception Perception: Not tested   Praxis Praxis Praxis: Not tested   Communication Communication Communication: No apparent difficulties   Cognition Arousal: Alert Behavior During Therapy: WFL for tasks assessed/performed                                 Following commands: Intact        Cueing   Cueing Techniques: Verbal cues, Tactile cues  Exercises      Shoulder Instructions       General  Comments VSS on RA    Pertinent Vitals/ Pain       Pain Assessment Pain Assessment: No/denies pain  Home Living                                          Prior Functioning/Environment              Frequency  Min 1X/week        Progress Toward Goals  OT Goals(current goals can now be found in the care plan section)  Progress towards OT goals: Progressing  toward goals  Acute Rehab OT Goals Patient Stated Goal: to get home OT Goal Formulation: With patient Time For Goal Achievement: 06/04/23 Potential to Achieve Goals: Good ADL Goals Pt Will Perform Upper Body Dressing: with supervision;sitting Pt Will Perform Lower Body Dressing: with supervision;sitting/lateral leans;sit to/from stand Pt Will Transfer to Toilet: with supervision;ambulating;regular height toilet Additional ADL Goal #1: pt will follow 3 step command with min cues in prep for ADLs  Plan      Co-evaluation                 AM-PAC OT "6 Clicks" Daily Activity     Outcome Measure   Help from another person eating meals?: A Little Help from another person taking care of personal grooming?: A Little Help from another person toileting, which includes using toliet, bedpan, or urinal?: A Little Help from another person bathing (including washing, rinsing, drying)?: A Lot Help from another person to put on and taking off regular upper body clothing?: A Little Help from another person to put on and taking off regular lower body clothing?: A Lot 6 Click Score: 16    End of Session Equipment Utilized During Treatment: Rolling walker (2 wheels)  OT Visit Diagnosis: Unsteadiness on feet (R26.81);Other abnormalities of gait and mobility (R26.89);Muscle weakness (generalized) (M62.81)   Activity Tolerance Patient tolerated treatment well;Patient limited by fatigue   Patient Left in chair;with call bell/phone within reach;with chair alarm set   Nurse Communication Mobility status        Time: 1010-1030 OT Time Calculation (min): 20 min  Charges: OT General Charges $OT Visit: 1 Visit OT Treatments $Self Care/Home Management : 8-22 mins  Carver Fila, OTD, OTR/L SecureChat Preferred Acute Rehab [phone]   [name] 05/22/2023, 11:33 AM

## 2023-05-22 NOTE — Progress Notes (Addendum)
 Inpatient Rehabilitation Admissions Coordinator   I have left patient's daughter, [name], another voicemail and text to contact me to discuss her Mom's needs after discharge.  Ottie Glazier, RN, MSN Rehab Admissions Coordinator [phone] 05/22/2023 10:15 AM  I spoke with her daughter by phone. She lives across the street in same apartment complex to her Mom. She is available to assist her after discharge as needed. She states her Mom has discussed with her also. I have begun Auth with [facility] yesterday and await their approval and patient's medical clearance to admit. Noted yesterday with therapy limited by symptomatic hypotension.  Ottie Glazier, RN, MSN Rehab Admissions Coordinator [phone] 05/22/2023 10:31 AM

## 2023-05-22 NOTE — Plan of Care (Signed)
 Pt has rested quietly throughout the night with no distress noted. Alert and oriented. On room air. SR on the monitor. Up to [facility] to void. No N/V or pain voiced. No complaints voiced.     Problem: Coping: Goal: Ability to adjust to condition or change in health will improve Outcome: Progressing   Problem: Metabolic: Goal: Ability to maintain appropriate glucose levels will improve Outcome: Progressing   Problem: Nutritional: Goal: Maintenance of adequate nutrition will improve Outcome: Progressing   Problem: Education: Goal: Knowledge of General Education information will improve Description: Including pain rating scale, medication(s)/side effects and non-pharmacologic comfort measures Outcome: Progressing   Problem: Clinical Measurements: Goal: Respiratory complications will improve Outcome: Progressing   Problem: Pain Managment: Goal: General experience of comfort will improve and/or be controlled Outcome: Progressing   Problem: Activity: Goal: Ability to tolerate increased activity will improve Outcome: Progressing

## 2023-05-23 ENCOUNTER — Encounter (HOSPITAL_COMMUNITY): Payer: Self-pay | Admitting: Physical Medicine & Rehabilitation

## 2023-05-23 ENCOUNTER — Other Ambulatory Visit: Payer: Self-pay

## 2023-05-23 ENCOUNTER — Inpatient Hospital Stay (HOSPITAL_COMMUNITY)
Admission: AD | Admit: 2023-05-23 | Discharge: 2023-05-29 | DRG: 946 | Disposition: A | Discharge status: Home / Self Care | Source: Intra-hospital | Attending: Physical Medicine & Rehabilitation | Admitting: Physical Medicine & Rehabilitation

## 2023-05-23 DIAGNOSIS — K529 Noninfective gastroenteritis and colitis, unspecified: Secondary | ICD-10-CM | POA: Diagnosis present

## 2023-05-23 DIAGNOSIS — M19072 Primary osteoarthritis, left ankle and foot: Secondary | ICD-10-CM | POA: Diagnosis not present

## 2023-05-23 DIAGNOSIS — M62838 Other muscle spasm: Secondary | ICD-10-CM | POA: Diagnosis present

## 2023-05-23 DIAGNOSIS — M25572 Pain in left ankle and joints of left foot: Secondary | ICD-10-CM | POA: Diagnosis not present

## 2023-05-23 DIAGNOSIS — G8929 Other chronic pain: Secondary | ICD-10-CM | POA: Diagnosis present

## 2023-05-23 DIAGNOSIS — I10 Essential (primary) hypertension: Secondary | ICD-10-CM | POA: Diagnosis present

## 2023-05-23 DIAGNOSIS — E11319 Type 2 diabetes mellitus with unspecified diabetic retinopathy without macular edema: Secondary | ICD-10-CM | POA: Diagnosis present

## 2023-05-23 DIAGNOSIS — N179 Acute kidney failure, unspecified: Secondary | ICD-10-CM | POA: Diagnosis not present

## 2023-05-23 DIAGNOSIS — Z6831 Body mass index (BMI) 31.0-31.9, adult: Secondary | ICD-10-CM

## 2023-05-23 DIAGNOSIS — R5381 Other malaise: Principal | ICD-10-CM | POA: Diagnosis present

## 2023-05-23 DIAGNOSIS — F17290 Nicotine dependence, other tobacco product, uncomplicated: Secondary | ICD-10-CM | POA: Diagnosis present

## 2023-05-23 DIAGNOSIS — D500 Iron deficiency anemia secondary to blood loss (chronic): Secondary | ICD-10-CM | POA: Diagnosis present

## 2023-05-23 DIAGNOSIS — J9690 Respiratory failure, unspecified, unspecified whether with hypoxia or hypercapnia: Secondary | ICD-10-CM | POA: Diagnosis present

## 2023-05-23 DIAGNOSIS — E1142 Type 2 diabetes mellitus with diabetic polyneuropathy: Secondary | ICD-10-CM | POA: Diagnosis present

## 2023-05-23 DIAGNOSIS — N920 Excessive and frequent menstruation with regular cycle: Secondary | ICD-10-CM | POA: Diagnosis present

## 2023-05-23 DIAGNOSIS — J45901 Unspecified asthma with (acute) exacerbation: Secondary | ICD-10-CM

## 2023-05-23 DIAGNOSIS — K219 Gastro-esophageal reflux disease without esophagitis: Secondary | ICD-10-CM | POA: Diagnosis present

## 2023-05-23 DIAGNOSIS — J189 Pneumonia, unspecified organism: Secondary | ICD-10-CM | POA: Diagnosis not present

## 2023-05-23 DIAGNOSIS — J9600 Acute respiratory failure, unspecified whether with hypoxia or hypercapnia: Secondary | ICD-10-CM | POA: Diagnosis not present

## 2023-05-23 DIAGNOSIS — B3731 Acute candidiasis of vulva and vagina: Secondary | ICD-10-CM | POA: Diagnosis not present

## 2023-05-23 DIAGNOSIS — Z794 Long term (current) use of insulin: Secondary | ICD-10-CM

## 2023-05-23 DIAGNOSIS — R1312 Dysphagia, oropharyngeal phase: Secondary | ICD-10-CM | POA: Diagnosis present

## 2023-05-23 DIAGNOSIS — J45909 Unspecified asthma, uncomplicated: Secondary | ICD-10-CM | POA: Diagnosis present

## 2023-05-23 DIAGNOSIS — Z8249 Family history of ischemic heart disease and other diseases of the circulatory system: Secondary | ICD-10-CM | POA: Diagnosis not present

## 2023-05-23 DIAGNOSIS — D62 Acute posthemorrhagic anemia: Secondary | ICD-10-CM | POA: Diagnosis not present

## 2023-05-23 DIAGNOSIS — E1165 Type 2 diabetes mellitus with hyperglycemia: Secondary | ICD-10-CM | POA: Diagnosis not present

## 2023-05-23 DIAGNOSIS — Z833 Family history of diabetes mellitus: Secondary | ICD-10-CM

## 2023-05-23 DIAGNOSIS — Z79899 Other long term (current) drug therapy: Secondary | ICD-10-CM | POA: Diagnosis not present

## 2023-05-23 DIAGNOSIS — Z7984 Long term (current) use of oral hypoglycemic drugs: Secondary | ICD-10-CM | POA: Diagnosis not present

## 2023-05-23 DIAGNOSIS — J9601 Acute respiratory failure with hypoxia: Secondary | ICD-10-CM | POA: Diagnosis not present

## 2023-05-23 DIAGNOSIS — E876 Hypokalemia: Secondary | ICD-10-CM | POA: Diagnosis present

## 2023-05-23 DIAGNOSIS — M549 Dorsalgia, unspecified: Secondary | ICD-10-CM | POA: Diagnosis present

## 2023-05-23 DIAGNOSIS — Z888 Allergy status to other drugs, medicaments and biological substances status: Secondary | ICD-10-CM

## 2023-05-23 DIAGNOSIS — Z823 Family history of stroke: Secondary | ICD-10-CM

## 2023-05-23 DIAGNOSIS — Z9049 Acquired absence of other specified parts of digestive tract: Secondary | ICD-10-CM

## 2023-05-23 DIAGNOSIS — Z8619 Personal history of other infectious and parasitic diseases: Secondary | ICD-10-CM

## 2023-05-23 DIAGNOSIS — E66811 Obesity, class 1: Secondary | ICD-10-CM | POA: Diagnosis present

## 2023-05-23 DIAGNOSIS — J101 Influenza due to other identified influenza virus with other respiratory manifestations: Secondary | ICD-10-CM | POA: Diagnosis not present

## 2023-05-23 LAB — GLUCOSE, CAPILLARY
Glucose-Capillary: 189 mg/dL — ABNORMAL HIGH (ref 70–99)
Glucose-Capillary: 132 mg/dL — ABNORMAL HIGH (ref 70–99)
Glucose-Capillary: 214 mg/dL — ABNORMAL HIGH (ref 70–99)
Glucose-Capillary: 256 mg/dL — ABNORMAL HIGH (ref 70–99)
Glucose-Capillary: 193 mg/dL — ABNORMAL HIGH (ref 70–99)

## 2023-05-23 MED ORDER — HEPARIN SODIUM (PORCINE) 5000 UNIT/ML IJ SOLN
5000.0000 [IU] | Freq: Three times a day (TID) | INTRAMUSCULAR | Status: DC
Start: 2023-05-23 — End: 2023-05-28
  Administered 2023-05-23 – 2023-05-28 (×14): 5000 [IU] via SUBCUTANEOUS
  Filled 2023-05-23 (×16): qty 1

## 2023-05-23 MED ORDER — ARFORMOTEROL TARTRATE 15 MCG/2ML IN NEBU
15.0000 ug | INHALATION_SOLUTION | Freq: Two times a day (BID) | RESPIRATORY_TRACT | Status: DC
  Administered 2023-05-24 – 2023-05-29 (×10): 15 ug via RESPIRATORY_TRACT
  Filled 2023-05-23 (×15): qty 2

## 2023-05-23 MED ORDER — CYCLOBENZAPRINE HCL 5 MG PO TABS
5.0000 mg | ORAL_TABLET | Freq: Three times a day (TID) | ORAL | Status: DC | PRN
  Administered 2023-05-24 – 2023-05-29 (×7): 5 mg via ORAL
  Filled 2023-05-23 (×8): qty 1

## 2023-05-23 MED ORDER — HEPARIN SODIUM (PORCINE) 5000 UNIT/ML IJ SOLN: 5000.0000 [IU] | Freq: Three times a day (TID) | INTRAMUSCULAR | Status: DC

## 2023-05-23 MED ORDER — GUAIFENESIN-DM 100-10 MG/5ML PO SYRP
10.0000 mL | ORAL_SOLUTION | Freq: Four times a day (QID) | ORAL | Status: DC | PRN
  Administered 2023-05-24 – 2023-05-25 (×2): 10 mL via ORAL
  Filled 2023-05-23 (×3): qty 10

## 2023-05-23 MED ORDER — BUDESONIDE 0.5 MG/2ML IN SUSP: 0.5000 mg | Freq: Two times a day (BID) | RESPIRATORY_TRACT | 0 refills | Status: DC

## 2023-05-23 MED ORDER — PANTOPRAZOLE SODIUM 40 MG PO TBEC
40.0000 mg | DELAYED_RELEASE_TABLET | Freq: Every day | ORAL | Status: DC
Start: 2023-05-23 — End: 2023-05-28

## 2023-05-23 MED ORDER — PANTOPRAZOLE SODIUM 40 MG PO TBEC
40.0000 mg | DELAYED_RELEASE_TABLET | Freq: Every day | ORAL | Status: DC
  Administered 2023-05-24 – 2023-05-29 (×6): 40 mg via ORAL
  Filled 2023-05-23 (×6): qty 1

## 2023-05-23 MED ORDER — FLEET ENEMA RE ENEM: 1.0000 | ENEMA | Freq: Once | RECTAL | Status: DC | PRN

## 2023-05-23 MED ORDER — GABAPENTIN 400 MG PO CAPS
400.0000 mg | ORAL_CAPSULE | Freq: Three times a day (TID) | ORAL | Status: DC
  Administered 2023-05-23 – 2023-05-29 (×17): 400 mg via ORAL
  Filled 2023-05-23 (×17): qty 1

## 2023-05-23 MED ORDER — ONDANSETRON HCL 4 MG PO TABS: 4.0000 mg | ORAL_TABLET | Freq: Four times a day (QID) | ORAL | Status: DC | PRN

## 2023-05-23 MED ORDER — GABAPENTIN 400 MG PO CAPS
400.0000 mg | ORAL_CAPSULE | Freq: Three times a day (TID) | ORAL | Status: DC
Start: 2023-05-23 — End: 2023-05-28

## 2023-05-23 MED ORDER — VITAMIN B-12 1000 MCG PO TABS
1000.0000 ug | ORAL_TABLET | Freq: Every day | ORAL | Status: DC
  Administered 2023-05-24 – 2023-05-29 (×6): 1000 ug via ORAL
  Filled 2023-05-23 (×6): qty 1

## 2023-05-23 MED ORDER — ONDANSETRON HCL 4 MG/2ML IJ SOLN: 4.0000 mg | Freq: Four times a day (QID) | INTRAMUSCULAR | Status: DC | PRN

## 2023-05-23 MED ORDER — DOCUSATE SODIUM 100 MG PO CAPS: 100.0000 mg | ORAL_CAPSULE | Freq: Two times a day (BID) | ORAL | Status: DC

## 2023-05-23 MED ORDER — POLYETHYLENE GLYCOL 3350 17 G PO PACK
17.0000 g | PACK | Freq: Every day | ORAL | Status: DC
Start: 2023-05-24 — End: 2023-05-29
  Filled 2023-05-23 (×4): qty 1

## 2023-05-23 MED ORDER — POLYVINYL ALCOHOL 1.4 % OP SOLN: 1.0000 [drp] | OPHTHALMIC | Status: DC | PRN

## 2023-05-23 MED ORDER — ARFORMOTEROL TARTRATE 15 MCG/2ML IN NEBU: 15.0000 ug | INHALATION_SOLUTION | Freq: Two times a day (BID) | RESPIRATORY_TRACT | Status: DC

## 2023-05-23 MED ORDER — CYCLOBENZAPRINE HCL 5 MG PO TABS: 5.0000 mg | ORAL_TABLET | Freq: Three times a day (TID) | ORAL | Status: DC | PRN

## 2023-05-23 MED ORDER — INSULIN ASPART 100 UNIT/ML IJ SOLN
0.0000 [IU] | Freq: Three times a day (TID) | INTRAMUSCULAR | Status: DC
  Administered 2023-05-23: 5 [IU] via SUBCUTANEOUS
  Administered 2023-05-24 (×2): 2 [IU] via SUBCUTANEOUS
  Administered 2023-05-24: 5 [IU] via SUBCUTANEOUS
  Administered 2023-05-25: 3 [IU] via SUBCUTANEOUS
  Administered 2023-05-25: 5 [IU] via SUBCUTANEOUS
  Administered 2023-05-25: 3 [IU] via SUBCUTANEOUS
  Administered 2023-05-26: 5 [IU] via SUBCUTANEOUS
  Administered 2023-05-26: 3 [IU] via SUBCUTANEOUS
  Administered 2023-05-26 – 2023-05-27 (×2): 5 [IU] via SUBCUTANEOUS
  Administered 2023-05-27: 3 [IU] via SUBCUTANEOUS
  Administered 2023-05-27 – 2023-05-28 (×2): 1 [IU] via SUBCUTANEOUS
  Administered 2023-05-28 (×2): 2 [IU] via SUBCUTANEOUS
  Administered 2023-05-29: 3 [IU] via SUBCUTANEOUS

## 2023-05-23 MED ORDER — BUDESONIDE 0.5 MG/2ML IN SUSP
0.5000 mg | Freq: Two times a day (BID) | RESPIRATORY_TRACT | Status: DC
  Administered 2023-05-23 – 2023-05-29 (×11): 0.5 mg via RESPIRATORY_TRACT
  Filled 2023-05-23 (×16): qty 2

## 2023-05-23 MED ORDER — ADULT MULTIVITAMIN W/MINERALS CH
1.0000 | ORAL_TABLET | Freq: Every day | ORAL | Status: DC
  Administered 2023-05-24 – 2023-05-29 (×6): 1 via ORAL
  Filled 2023-05-23 (×6): qty 1

## 2023-05-23 MED ORDER — ACETAMINOPHEN 325 MG PO TABS
325.0000 mg | ORAL_TABLET | ORAL | Status: DC | PRN
  Administered 2023-05-23 – 2023-05-26 (×5): 650 mg via ORAL
  Filled 2023-05-23 (×6): qty 2
  Filled 2023-05-23: qty 1

## 2023-05-23 MED ORDER — ALUM & MAG HYDROXIDE-SIMETH 200-200-20 MG/5ML PO SUSP: 30.0000 mL | ORAL | Status: DC | PRN

## 2023-05-23 MED ORDER — METOPROLOL TARTRATE 12.5 MG HALF TABLET
25.0000 mg | ORAL_TABLET | Freq: Two times a day (BID) | ORAL | Status: DC
  Administered 2023-05-23 – 2023-05-29 (×12): 25 mg via ORAL
  Filled 2023-05-23 (×12): qty 2

## 2023-05-23 MED ORDER — DOCUSATE SODIUM 100 MG PO CAPS
100.0000 mg | ORAL_CAPSULE | Freq: Two times a day (BID) | ORAL | Status: DC
  Filled 2023-05-23 (×4): qty 1

## 2023-05-23 MED ORDER — REVEFENACIN 175 MCG/3ML IN SOLN
175.0000 ug | Freq: Every day | RESPIRATORY_TRACT | Status: DC
  Administered 2023-05-24 – 2023-05-29 (×5): 175 ug via RESPIRATORY_TRACT
  Filled 2023-05-23 (×6): qty 3

## 2023-05-23 MED ORDER — AMITRIPTYLINE HCL 25 MG PO TABS
25.0000 mg | ORAL_TABLET | Freq: Every day | ORAL | Status: DC
  Administered 2023-05-23 – 2023-05-28 (×6): 25 mg via ORAL
  Filled 2023-05-23 (×6): qty 1

## 2023-05-23 MED ORDER — INSULIN GLARGINE 100 UNIT/ML ~~LOC~~ SOLN
30.0000 [IU] | Freq: Every day | SUBCUTANEOUS | Status: DC
Start: 2023-05-24 — End: 2023-05-24
  Administered 2023-05-24: 30 [IU] via SUBCUTANEOUS
  Filled 2023-05-23: qty 0.3

## 2023-05-23 MED ORDER — DIPHENHYDRAMINE HCL 25 MG PO CAPS: 25.0000 mg | ORAL_CAPSULE | Freq: Four times a day (QID) | ORAL | Status: DC | PRN

## 2023-05-23 NOTE — Consult Note (Signed)
 [facility] Liaison Consult Note   05/23/2023  Melinda Stone 1980/03/29 [id number]    Primary Care Provider:  Anselm Jungling, NP is a provider with [facility], is listed this admission,  who makes home visit, check provider in rosters currently not in an affiliated provider with [facility] [VBCI] confirmed this is the patient's PCP with the provider today.   Insurance:  Psychologist, clinical for insurance changes   The patient is not on the current member enrollment rosters for any of the [employer] risk contracted plans with an affiliated Nature conservation officer.     Reason:  Not a beneficiary currently attributed to one of the [facility]BCI populations.    Patient's primary care provider is not an in-network provider with [facility] at this time. *Membership roster was used to verify patient status. Patient with a previous history noted in 2022. Will sign off. Patient currently for CIR for transition noted.  For questions, please call:  Charlesetta Shanks, RN, BSN, CCM [place]  [facility], [facility] Liaison Direct Dial: [phone] or secure chat Email: [email or web address]

## 2023-05-23 NOTE — Progress Notes (Signed)
 Melinda Dance, MD  Physician Physical Medicine and Rehabilitation   PMR Pre-admission    Signed   Date of Service: 05/21/2023  4:43 PM  Related encounter: ED to Hosp-Admission (Current) from 05/11/2023 in [facility] 5W Medical Specialty PCU   Signed     Expand All Collapse All  Show:Clear all [x] Written[x] Templated[] Copied  Added by: [x] Standley Brooking, RN  [] Hover for details PMR Admission Coordinator Pre-Admission Assessment   Patient: Melinda Stone is an 43 y.o., female MRN: [id number] DOB: 09-09-1980 Height: 5\' 9"  (175.3 cm) Weight: 98.1 kg                                                                                                           Insurance Information HMO:     PPO:      PCP:      IPA:      80/20:      OTHER:  PRIMARY: A[employer]na Medicare HMO/PPO      Policy#: [id number]      Subscriber: pt CM Name: Aldean Jewett      Phone#: [phone]     Fax#: [phone] Pre-Cert#: [id number] approved for 8 days  3/6 until 3/14. Updates due 3/15     Employer:  Benefits:  Phone #: [phone]     Name: 3/4 Eff. Date: 03/20/23     Deduct: $257      Out of Pocket Max: $9350      Life Max: none  CIR: $1690 co pay per admit      SNF: 100% Outpatient: 80%     Co-Pay: 20% Home Health: 80%      Co-Pay: 20% DME: 80%     Co-Pay: 20% Providers: in network  SECONDARY: Medicaid of [place]     Policy#: [id number]      Phone#: verified by passport one source online 3/5 active COEMAFCN   Financial Counselor:       Phone#:    The Engineer, materials Information Summary" for patients in Inpatient Rehabilitation Facilities with attached "Privacy Act Statement-Health Care Records" was provided and verbally reviewed with: Patient   Emergency Contact Information Contact Information       Name Relation Home Work Mobile    Melinda Stone,Melinda Stone Daughter     [phone]    Melinda Stone, Melinda Stone     [phone]         Other Contacts       Name Relation Home Work Mobile     Melinda Stone Sister [phone]   [phone]    Melinda Stone, Melinda Stone     [phone]         Current Medical History  Patient Admitting Diagnosis: Debility related to FLU, PNA   History of Present Illness: 43 yo female with history of chronic pain, tobacco abuse, 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 Presented on 05/11/23 with nausea, vomiting and myalgias.Exposure to son with influenza. Patient found to be have influenza and  pneumonia on imaging.   Began antibiotics and developed shortness of breath. On 05/12/23 developed worsening hypoxemia requiring intubation and admitted to the ICU. Required BIPAP for a few days postextubation after 05/17/23.  Developed ICU delirium and oropharyngeal dysphagia requiring NGT feeding. Cortrak removed on 3/4. Has completed course of steroids, Tamiflu and antibiotics. Has required 4 units of PRBC felt likely secondary to acute illness in setting of known iron deficiency anemia.    Continue percocet and gabapentin and amitriptyline for chronic pain and mood disorder.Heparin SQ for DVT prophylaxis.     Patient's medical record from [facility] has been reviewed by the rehabilitation admission coordinator and physician.   Past Medical History      Past Medical History:  Diagnosis Date   Anemia     Arthritis 08/17/2020   Chronic back pain     Diabetes mellitus without complication (HCC)      type 2   GERD (gastroesophageal reflux disease)     Migraine     Ovarian cyst     Pneumonia     Polyneuropathy          Has the patient had major surgery during 100 days prior to admission? No   Family History  family history includes Diabetes in her mother; Hypertension in her mother; Stroke in her mother.   Current Medications   Current Medications     Current Facility-Administered Medications:    acetaminophen (TYLENOL) suppository 650 mg, 650 mg, Rectal, Q4H PRN, Assaker, West Bali, MD, 650 mg at 05/13/23 2347   acetaminophen (TYLENOL) tablet 650 mg, 650 mg, Oral, Q6H PRN, Hammons, Kimberly B, RPH   amitriptyline (ELAVIL) tablet 25 mg, 25 mg, Oral, QHS, Ghimire, Kuber, MD, 25 mg at 05/22/23 2248   arformoterol (BROVANA) nebulizer solution 15 mcg, 15 mcg, Nebulization, BID, Steffanie Dunn, DO, 15 mcg at 05/23/23 0852   budesonide (PULMICORT) nebulizer solution 0.5 mg, 0.5 mg, Nebulization, BID, Steffanie Dunn, DO, 0.5 mg at 05/23/23 1191   Chlorhexidine Gluconate Cloth 2 % PADS 6 each, 6 each, Topical, Daily, Assaker, [place] Bali, MD, 6 each at 05/22/23 1002   cyclobenzaprine (FLEXERIL) tablet 5 mg, 5 mg, Oral, TID PRN, Maretta Bees, MD, 5 mg at 05/21/23 1300   docusate sodium (COLACE) capsule 100 mg, 100 mg, Oral, BID, Hammons, Kimberly B, RPH, 100 mg at 05/22/23 2249   gabapentin (NEURONTIN) capsule 400 mg, 400 mg, Oral, Q8H, Arabella Merles, RPH, 400 mg at 05/23/23 0536   heparin injection 5,000 Units, 5,000 Units, Subcutaneous, Q8H, Rochel Brome C, NP, 5,000 Units at 05/23/23 0536   hydrALAZINE (APRESOLINE) injection 10 mg, 10 mg, Intravenous, Q6H PRN, Gertha Calkin, MD, 10 mg at 05/18/23 0411   insulin aspart (novoLOG) injection 0-9 Units, 0-9 Units, Subcutaneous, TID WC, Ghimire, Werner Lean, MD, 1 Units at 05/21/23 1300   insulin glargine (LANTUS) injection 30 Units, 30 Units, Subcutaneous, Daily, Ghimire, Werner Lean, MD, 30 Units at 05/22/23 1001   metoprolol tartrate (LOPRESSOR) tablet 25 mg, 25 mg, Oral, BID, Hammons, Kimberly B, RPH, 25 mg at 05/22/23 2248   multivitamin with minerals tablet 1 tablet, 1 tablet, Oral, Daily, Hammons, Kimberly B, RPH, 1 tablet at 05/22/23 1001   ondansetron (ZOFRAN) injection 4 mg, 4 mg, Intravenous, Q6H PRN, Cheri Fowler, MD, 4 mg at 05/21/23 0920   oxyCODONE (Oxy IR/ROXICODONE) immediate release  tablet 10 mg, 10 mg, Oral, Q6H PRN, Hammons, Kimberly B, RPH   pantoprazole (PROTONIX) EC tablet 40 mg, 40 mg,  Oral, Daily, Hammons, Kimberly B, RPH, 40 mg at 05/22/23 1001   polyethylene glycol (MIRALAX / GLYCOLAX) packet 17 g, 17 g, Oral, Daily, Hammons, Kimberly B, RPH   polyvinyl alcohol (LIQUIFILM TEARS) 1.4 % ophthalmic solution 1 drop, 1 drop, Both Eyes, PRN, Rochel Brome C, NP, 1 drop at 05/18/23 1441   prochlorperazine (COMPAZINE) injection 10 mg, 10 mg, Intravenous, Q6H PRN, Sundil, Subrina, MD   revefenacin (YUPELRI) nebulizer solution 175 mcg, 175 mcg, Nebulization, Daily, Steffanie Dunn, DO, 175 mcg at 05/23/23 0981   sodium chloride flush (NS) 0.9 % injection 3 mL, 3 mL, Intravenous, Q12H, Irena Cords V, MD, 3 mL at 05/22/23 2249   vitamin B-12 (CYANOCOBALAMIN) tablet 100 mcg, 100 mcg, Oral, Daily, Hammons, Kimberly B, RPH, 100 mcg at 05/22/23 1001     Patients Current Diet:  Diet Order                  Diet Carb Modified Fluid consistency: Thin; Room service appropriate? Yes  Diet effective now             Diet Carb Modified                       Precautions / Restrictions Precautions Precautions: Fall Restrictions Weight Bearing Restrictions Per Provider Order: No    Has the patient had 2 or more falls or a fall with injury in the past year?No   Prior Activity Level Community (5-7x/wk): independent without AD, does not drive   Prior Functional Level Prior Function Prior Level of Function : Independent/Modified Independent Mobility Comments: reports not using AD, doesn't drive, children do her grocery shopping ADLs Comments: mod I   Self Care: Did the patient need help bathing, dressing, using the toilet or eating?  Independent   Indoor Mobility: Did the patient need assistance with walking from room to room (with or without device)? Independent   Stairs: Did the patient need assistance with internal or external stairs (with or without device)?  Independent   Functional Cognition: Did the patient need help planning regular tasks such as shopping or remembering to take medications? Independent   Patient Information Are you of Hispanic, Latino/a,or Spanish origin?: A. No, not of Hispanic, Latino/a, or Spanish origin What is your race?: B. Black or African American Do you need or want an interpreter to communicate with a doctor or health care staff?: 0. No   Patient's Response To:  Health Literacy and Transportation Is the patient able to respond to health literacy and transportation needs?: Yes Health Literacy - How often do you need to have someone help you when you read instructions, pamphlets, or other written material from your doctor or pharmacy?: Never In the past 12 months, has lack of transportation kept you from medical appointments or from getting medications?: No In the past 12 months, has lack of transportation kept you from meetings, work, or from getting things needed for daily living?: No   Home Assistive Devices / Equipment Home Equipment: None   Prior Device Use: Indicate devices/aids used by the patient prior to current illness, exacerbation or injury? None of the above   Current Functional Level Cognition   Orientation Level: Oriented X4    Extremity Assessment (includes Sensation/Coordination)   Upper Extremity Assessment: Generalized weakness  Lower Extremity Assessment: Generalized weakness     ADLs   Overall ADL's : Needs assistance/impaired Eating/Feeding: Set up Grooming: Set up, Wash/dry face, Standing Upper Body Bathing: Minimal assistance,  Sitting Lower Body Bathing: Maximal assistance Upper Body Dressing : Minimal assistance, Sitting Upper Body Dressing Details (indicate cue type and reason): donning gown on backside Lower Body Dressing: Maximal assistance Toilet Transfer: Contact guard assist, Ambulation, Regular Toilet, Rolling walker (2 wheels) Toileting- Clothing Manipulation and  Hygiene: Moderate assistance Functional mobility during ADLs: Contact guard assist, Rolling walker (2 wheels)     Mobility   Overal bed mobility: Needs Assistance Bed Mobility: Supine to Sit, Sit to Supine Supine to sit: Mod assist Sit to supine: Mod assist General bed mobility comments: In recliner     Transfers   Overall transfer level: Needs assistance Equipment used: Rolling walker (2 wheels) Transfers: Sit to/from Stand Sit to Stand: Min assist Bed to/from chair/wheelchair/BSC transfer type:: Step pivot Step pivot transfers: Mod assist General transfer comment: min assist for boost to stand x2 today, reduced control with descent. Cues for hand placement, slow and effortful to rise.     Ambulation / Gait / Stairs / Wheelchair Mobility   Ambulation/Gait General Gait Details: deferred due to hypotension and lethargy +dizziness     Posture / Balance Balance Overall balance assessment: Needs assistance Sitting-balance support: Feet supported, No upper extremity supported Sitting balance-Leahy Scale: Good Standing balance support: Bilateral upper extremity supported, During functional activity, Reliant on assistive device for balance Standing balance-Leahy Scale: Poor Standing balance comment: dependent on external support     Special needs/care consideration Droplet precautions        Previous Living Setting Living Arrangements:  (lives with children, 82 yom 43 yo and 88 yo dtr next door)  Lives With: Family Available Help at Discharge:  (reports 41 yo dtr can provide 24/7) Type of Home: Apartment Home Layout: One level Home Access: Level entry Bathroom Shower/Tub: Engineer, manufacturing systems: Standard Bathroom Accessibility: Yes How Accessible: Accessible via walker Home Care Services: No   43 yo [place] lives across the street in same apartment complex. Does not work or go to school   Discharge Living Setting Plans for Discharge Living Setting: Patient's  home, Lives with (comment), Apartment (17 and 19 yo children) Type of Home at Discharge: Apartment Discharge Home Layout: One level Discharge Home Access: Level entry Discharge Bathroom Shower/Tub: Tub/shower unit Discharge Bathroom Toilet: Standard Discharge Bathroom Accessibility: Yes How Accessible: Accessible via walker Does the patient have any problems obtaining your medications?: No   Social/Family/Support Systems Patient Roles: Parent Contact Information: Archivist Anticipated Caregiver: children Anticipated Caregiver's Contact Information: see contacts Caregiver Availability: 24/7 Discharge Plan Discussed with Primary Caregiver: Yes Is Caregiver In Agreement with Plan?: Yes Does Caregiver/Family have Issues with Lodging/Transportation while Pt is in Rehab?: No   Goals Patient/Family Goal for Rehab: Mod I to supervision with PT, OT and SLP Expected length of stay: ELOS 10 to 12 Pt/Family Agrees to Admission and willing to participate: Yes Program Orientation Provided & Reviewed with Pt/Caregiver Including Roles  & Responsibilities: Yes   Decrease burden of Care through IP rehab admission: n/a   Possible need for SNF placement upon discharge:not anticipated   Patient Condition: This patient's condition remains as documented in the consult dated 05/21/23, in which the Rehabilitation Physician determined and documented that the patient's condition is appropriate for intensive rehabilitative care in an inpatient rehabilitation facility. Will admit to inpatient rehab today.   Preadmission Screen Completed By:  Clois Dupes, RN MSN 05/23/2023 10:16 AM ______________________________________________________________________   Discussed status with Dr. Natale Lay on 05/23/23 at [phone] and received approval for admission today.  Admission Coordinator:  Clois Dupes, RN MSN time 1610 Date 05/23/23            Revision History

## 2023-05-23 NOTE — TOC Transition Note (Signed)
 Transition of Care [facility]) - Discharge Note   Patient Details  Name: Melinda Stone MRN: [id number] Date of Birth: 04/26/1980  [facility] CM/SW Contact:  Lockie Pares, RN Phone Number: 05/23/2023, 9:46 AM   Clinical Narrative:     There is a bed available today at CIR. Plan to admit to CIR today  Final next level of care: IP Rehab Facility Barriers to Discharge: No Barriers Identified   Patient Goals and CMS Choice     Choice offered to / list presented to : NA      Discharge Placement               CIR        Discharge Plan and Services Additional resources added to the After Visit Summary for     Discharge Planning Services: CM Consult                                 Social Drivers of Health (SDOH) Interventions SDOH Screenings   Food Insecurity: Patient Unable To Answer (05/15/2023)  Housing: Patient Unable To Answer (05/15/2023)  Transportation Needs: Patient Unable To Answer (05/15/2023)  Utilities: Patient Unable To Answer (05/15/2023)  Depression (PHQ2-9): High Risk (05/30/2022)  Social Connections: Socially Isolated (03/26/2023)  Tobacco Use: High Risk (05/11/2023)     Readmission Risk Interventions    03/27/2023   11:25 AM  Readmission Risk Prevention Plan  Transportation Screening Complete  Medication Review (RN Care Manager) Referral to Pharmacy  PCP or Specialist appointment within 3-5 days of discharge Complete  HRI or Home Care Consult Not Complete  HRI or Home Care Consult Pt Refusal Comments Not applicable for [facility].  SW Recovery Care/Counseling Consult Complete  Palliative Care Screening Not Applicable  Skilled Nursing Facility Not Applicable

## 2023-05-23 NOTE — Progress Notes (Signed)
 Fanny Dance, MD  Physician Physical Medicine and Rehabilitation   Consult Note    Signed   Date of Service: 05/21/2023  1:45 PM  Related encounter: ED to Hosp-Admission (Current) from 05/11/2023 in [facility]   Signed      Show: [] Written[] Templated[x] Copied  Added by: [x] Fanny Dance, MD  [] Hover for details           Physical Medicine and Rehabilitation Consult Reason for Consult: Rehab Referring Physician: Dr. Jerral Ralph     HPI: TOWANNA AVERY is a 43 y.o. female with past medical history of chronic back pain, tobacco abuse, lumbar spondylosis with prior back surgery, obesity, history of B12 deficiency, history of diabetes mellitus type 2, diabetic retinopathy, GI bleed, diastolic dysfunction, migraines, mood disorder who presented with fever, chills and myalgias.  She was found to have influenza A, CTA of the chest showed pneumonia.  She developed worsening hypoxemia transferred to the ICU and was intubated on 2/23.  On 2/28 she was extubated requiring BiPAP.  She had a core track placed that was removed on 3/4.  Echo with EF 65 to 70%, grade 1 diastolic dysfunction.  She had acute metabolic encephalopathy, now much improved.  Treated for AKI likely due to ischemic ATN, improving.  Reports some dizziness with therapy today.  She has been upgraded to carb modified diet, nectar thick liquids she has been evaluated by PT, OT and SLP found to have functional deficits that would benefit from CIR.   She lives in a [place] apartment with one-step to enter.  She has several children 75 who is 39 years old, 3 who are 41 years old-adopted and a 6 year old daughter who lives right beside her and can provide 24-hour assistance.     Review of Systems  Constitutional:  Positive for malaise/fatigue. Negative for chills and fever.  HENT:  Negative for hearing loss.   Eyes:  Negative for blurred vision and double vision.  Respiratory:  Positive  for shortness of breath.   Cardiovascular:  Negative for chest pain.  Gastrointestinal:  Positive for constipation and diarrhea. Negative for abdominal pain.  Genitourinary: Negative.   Musculoskeletal:  Positive for back pain.  Skin:  Negative for rash.  Neurological:  Positive for weakness and headaches. Negative for sensory change.  Psychiatric/Behavioral:  Negative for depression.               Past Medical History:  Diagnosis Date   Anemia     Arthritis 08/17/2020   Chronic back pain     Diabetes mellitus without complication (HCC)      type 2   GERD (gastroesophageal reflux disease)     Migraine     Ovarian cyst     Pneumonia     Polyneuropathy                        Past Surgical History:  Procedure Laterality Date   CESAREAN SECTION       CHOLECYSTECTOMY   2004   LUMBAR LAMINECTOMY N/A 05/30/2020    Procedure: Lumbar five Laminectomy,  Bilateral Microdiscectomy, Left Lumbar five -Sacral one Microdiscectomy;  Surgeon: [name], MD;  Location: [facility] OR;  Service: Orthopedics;  Laterality: N/A;   LUMBAR LAMINECTOMY Left 11/14/2020    Procedure: LEFT LUMBAR FOUR-FIVE MICRODISCECTOMY;  Surgeon: [name], MD;  Location: [facility] OR;  Service: Orthopedics;  Laterality: Left;   TUBAL LIGATION   10/19/2010  Family History  Problem Relation Age of Onset   Diabetes Mother     Hypertension Mother     Stroke Mother            Social History:  reports that she has been smoking cigars. She has never used smokeless tobacco. She reports that she does not currently use alcohol. She reports that she does not currently use drugs. Allergies:  Allergies         Allergies  Allergen Reactions   Trulicity [Dulaglutide] Diarrhea                    Medications Prior to Admission  Medication Sig Dispense Refill   acetaminophen (TYLENOL) 500 MG tablet Take 500 mg by mouth every 6 (six) hours as needed for mild pain or moderate pain.       amitriptyline  (ELAVIL) 25 MG tablet Take 1 tablet (25 mg total) by mouth at bedtime. 30 tablet 6   celecoxib (CELEBREX) 100 MG capsule Take 100 mg by mouth 2 (two) times daily as needed for mild pain (pain score 1-3) (take with food).       cholecalciferol (VITAMIN D3) 25 MCG (1000 UNIT) tablet Take 1,000 Units by mouth daily.       Cyanocobalamin (VITAMIN B-12) 2500 MCG SUBL Place 1 tablet (2,500 mcg total) under the tongue daily. 30 tablet 2   furosemide (LASIX) 20 MG tablet Take 1 tablet (20 mg total) by mouth daily. 6 tablet 0   gabapentin (NEURONTIN) 800 MG tablet Take 800 mg by mouth 3 (three) times daily.       glipiZIDE (GLUCOTROL) 10 MG tablet Take 10-20 mg by mouth See admin instructions. Take 20 mg by mouth before breakfast and 10 mg before supper       insulin aspart (NOVOLOG) 100 UNIT/ML FlexPen Before each meal 3 times a day, 140-199 - 2 units, 200-250 - 4 units, 251-299 - 8 units,  300-349 - 10 units,  350 or above 12 units. 15 mL 0   insulin glargine (LANTUS SOLOSTAR) 100 UNIT/ML Solostar Pen Inject 30 Units into the skin at bedtime. 9 mL 0   methocarbamol (ROBAXIN) 500 MG tablet Take 1 tablet (500 mg total) by mouth 2 (two) times daily. (Patient taking differently: Take 500 mg by mouth at bedtime.) 20 tablet 0   metoprolol tartrate (LOPRESSOR) 25 MG tablet Take 1 tablet (25 mg total) by mouth 2 (two) times daily. 60 tablet 0   ondansetron (ZOFRAN-ODT) 4 MG disintegrating tablet Take 1 tablet (4 mg total) by mouth every 6 (six) hours as needed for nausea or vomiting. 20 tablet 0   oxyCODONE-acetaminophen (PERCOCET/ROXICET) 5-325 MG tablet Take 1 tablet by mouth every 6 (six) hours as needed for moderate pain or severe pain.       tiZANidine (ZANAFLEX) 4 MG tablet Take 4 mg by mouth every 12 (twelve) hours as needed for muscle spasms (take with food).       Ubrogepant (UBRELVY) 100 MG TABS Take 1 tablet (100 mg total) by mouth as needed. Take 1 tablet at onset of headache, may repeat in 2 hours if  needed. Max is 200 mg in 24 hours. 12 tablet 11   vitamin C (ASCORBIC ACID) 250 MG tablet Take 2 tablets (500 mg total) by mouth daily. (Patient taking differently: Take 250 mg by mouth daily.) 180 tablet 0            Home: Home Living Family/patient expects to be  discharged to:: Private residence Living Arrangements: Children Available Help at Discharge: Family, Available PRN/intermittently Type of Home: Apartment Home Access: Level entry Home Layout: One level Bathroom Shower/Tub: Engineer, manufacturing systems: Standard Home Equipment: None  Functional History: Prior Function Prior Level of Function : Independent/Modified Independent Mobility Comments: reports not using AD, doesn't drive, children do her grocery shopping ADLs Comments: mod I Functional Status:  Mobility: Bed Mobility Overal bed mobility: Needs Assistance Bed Mobility: Supine to Sit, Sit to Supine Supine to sit: Mod assist Sit to supine: Mod assist General bed mobility comments: In recliner Transfers Overall transfer level: Needs assistance Equipment used: Rolling walker (2 wheels) Transfers: Sit to/from Stand Sit to Stand: Min assist Bed to/from chair/wheelchair/BSC transfer type:: Step pivot Step pivot transfers: Mod assist General transfer comment: min assist for boost to stand x2 today, reduced control with descent. Cues for hand placement, slow and effortful to rise. Ambulation/Gait General Gait Details: deferred due to hypotension and lethargy +dizziness   ADL: ADL Overall ADL's : Needs assistance/impaired Eating/Feeding: Set up Grooming: Minimal assistance Upper Body Bathing: Minimal assistance, Sitting Lower Body Bathing: Maximal assistance Upper Body Dressing : Minimal assistance Lower Body Dressing: Maximal assistance Toilet Transfer: Moderate assistance, Stand-pivot, BSC/3in1, Rolling walker (2 wheels) Toileting- Clothing Manipulation and Hygiene: Moderate assistance Functional  mobility during ADLs: Moderate assistance, Rolling walker (2 wheels)   Cognition: Cognition Orientation Level: Oriented X4 Cognition Arousal: Lethargic, Suspect due to medications Behavior During Therapy: Flat affect   Blood pressure 109/69, pulse 84, temperature 97.9 F (36.6 C), temperature source Oral, resp. rate 15, height 5\' 9"  (1.753 m), weight 95.3 kg, last menstrual period 04/01/2023, SpO2 95%. Physical Exam   General: No apparent distress, eating lunch HEENT: Head is normocephalic, atraumatic, sclera anicteric, oral mucosa pink and moist, decrease dentition Neck: Supple without JVD or lymphadenopathy Heart: Reg rate and rhythm. No murmurs rubs or gallops Chest: CTA bilaterally without wheezes, rales, or rhonchi; no distress Abdomen: Soft, non-tender, non-distended, bowel sounds positive. Extremities: No clubbing, cyanosis, or edema. Pulses are 2+ Psych: Pt's affect is appropriate. Pt is cooperative Skin: Clean and intact without signs of breakdown Neuro:     Mental Status: AAOx3, memory grossly intact, attention deficits present, delayed responses Speech/Languate: Naming and repetition intact, fluent, follows simple commands CRANIAL NERVES: 2 through 12 grossly intact   MOTOR: RUE: 4/5 Deltoid, 4/5 Biceps, 4/5 Triceps,4/5 Grip LUE: 4/5 Deltoid, 4/5 Biceps, 4/5 Triceps, 4/5 Grip RLE: HF 4/5, KE 4/5, ADF 4/5, APF 4/5 LLE: HF 4/5, KE 4/5, ADF 4/5, APF 4/5   SENSORY: Normal to touch all 4 extremities         Lab Results Last 24 Hours           Results for orders placed or performed during the hospital encounter of 05/11/23 (from the past 24 hours)  Glucose, capillary     Status: Abnormal    Collection Time: 05/20/23  1:12 PM  Result Value Ref Range    Glucose-Capillary 214 (H) 70 - 99 mg/dL    Comment 1 Document in Chart    Glucose, capillary     Status: Abnormal    Collection Time: 05/20/23  3:51 PM  Result Value Ref Range    Glucose-Capillary 197 (H) 70 - 99  mg/dL  Glucose, capillary     Status: Abnormal    Collection Time: 05/20/23  8:00 PM  Result Value Ref Range    Glucose-Capillary 150 (H) 70 - 99 mg/dL  Glucose, capillary  Status: Abnormal    Collection Time: 05/20/23 11:10 PM  Result Value Ref Range    Glucose-Capillary 155 (H) 70 - 99 mg/dL  Glucose, capillary     Status: Abnormal    Collection Time: 05/21/23  4:32 AM  Result Value Ref Range    Glucose-Capillary 56 (L) 70 - 99 mg/dL  Renal function panel     Status: Abnormal    Collection Time: 05/21/23  4:40 AM  Result Value Ref Range    Sodium 144 135 - 145 mmol/L    Potassium 3.3 (L) 3.5 - 5.1 mmol/L    Chloride 110 98 - 111 mmol/L    CO2 25 22 - 32 mmol/L    Glucose, Bld 60 (L) 70 - 99 mg/dL    BUN 62 (H) 6 - 20 mg/dL    Creatinine, Ser 2.13 (H) 0.44 - 1.00 mg/dL    Calcium 8.2 (L) 8.9 - 10.3 mg/dL    Phosphorus 4.9 (H) 2.5 - 4.6 mg/dL    Albumin 2.3 (L) 3.5 - 5.0 g/dL    GFR, Estimated 46 (L) >60 mL/min    Anion gap 9 5 - 15  Magnesium     Status: None    Collection Time: 05/21/23  4:40 AM  Result Value Ref Range    Magnesium 2.0 1.7 - 2.4 mg/dL  Glucose, capillary     Status: Abnormal    Collection Time: 05/21/23  5:20 AM  Result Value Ref Range    Glucose-Capillary 106 (H) 70 - 99 mg/dL  Glucose, capillary     Status: Abnormal    Collection Time: 05/21/23  8:39 AM  Result Value Ref Range    Glucose-Capillary 207 (H) 70 - 99 mg/dL  CBC     Status: Abnormal    Collection Time: 05/21/23  9:09 AM  Result Value Ref Range    WBC 8.7 4.0 - 10.5 K/uL    RBC 2.94 (L) 3.87 - 5.11 MIL/uL    Hemoglobin 7.8 (L) 12.0 - 15.0 g/dL    HCT 08.6 (L) 57.8 - 46.0 %    MCV 83.0 80.0 - 100.0 fL    MCH 26.5 26.0 - 34.0 pg    MCHC 32.0 30.0 - 36.0 g/dL    RDW 46.9 (H) 62.9 - 15.5 %    Platelets 304 150 - 400 K/uL    nRBC 0.0 0.0 - 0.2 %  Glucose, capillary     Status: Abnormal    Collection Time: 05/21/23 12:49 PM  Result Value Ref Range    Glucose-Capillary 122 (H) 70 -  99 mg/dL        Imaging Results (Last 48 hours)  DG Swallowing Func-Speech Pathology Result Date: 05/20/2023 Table formatting from the original result was not included. Modified Barium Swallow Study Patient Details Name: ELLAR HAKALA MRN: [id number] Date of Birth: 04-05-1980 Today's Date: 05/20/2023 HPI/PMH: HPI: KERIE BADGER is a 43 yo female presenting to ED 2/22 with complaints of nausea, vomiting, abdominal pain, and myalgias. Found to be Influenza A positive. CTA showed R predominant consolidative and nodular opacities. ETT 2/23-2/28. 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 Boluses progress posteriorly to the level of the pyriform sinuses prior to swallow initiation, which intermittently allows thin liquids to enter her laryngeal vestibule.  At times, they are immediately ejected by complete laryngeal elevation but other times, they progress quickly below the level of the vocal folds without being sensed (PAS 8). No further penetration or aspiration was observed with subsequent trials of nectar and honey thick liquids, purees, or solids. She initiates efficient mastication with complete oral clearance. Pt does have a collection of vallecular residue with solids specifically, but a liquid wash was effective in clearing this. Recommend initiating diet of regular solids with nectar thick liquids. SLP will f/u to trial RMST and compensatory strategies to upgrade diet as clinically indicated. DIGEST Swallow Severity Rating*  Safety: 2  Efficiency: 2  Overall Pharyngeal Swallow Severity: 2 (moderate) 1: mild; 2: moderate; 3: severe; 4: profound *The Dynamic Imaging Grade of Swallowing Toxicity is standardized for the  head and neck cancer population, however, demonstrates promising clinical applications across populations to standardize the clinical rating of pharyngeal swallow safety and severity. Factors that may increase risk of adverse event in presence of aspiration [name] & Clearance [name] 2021): Factors that may increase risk of adverse event in presence of aspiration [name] & Clearance [name] 2021): Frail or deconditioned; Weak cough; Presence of tubes (ETT, trach, NG, etc.) Recommendations/Plan: Swallowing Evaluation Recommendations Swallowing Evaluation Recommendations Recommendations: PO diet PO Diet Recommendation: Regular; Mildly thick liquids (Level 2, nectar thick) Liquid Administration via: Cup; Straw Medication Administration: Whole meds with puree Supervision: Patient able to self-feed; Full supervision/cueing for swallowing strategies Swallowing strategies  : Minimize environmental distractions; Slow rate; Small bites/sips Postural changes: Position pt fully upright for meals Oral care recommendations: Oral care BID (2x/day) Caregiver Recommendations: Avoid jello, ice cream, thin soups, popsicles; Remove water pitcher Treatment Plan Treatment Plan Treatment recommendations: Therapy as outlined in treatment plan below Follow-up recommendations: Acute inpatient rehab (3 hours/day) Functional status assessment: Patient has had a recent decline in their functional status and demonstrates the ability to make significant improvements in function in a reasonable and predictable amount of time. Treatment frequency: Min 2x/week Treatment duration: 2 weeks Interventions: Aspiration precaution training; Compensatory techniques; Patient/family education; Trials of upgraded texture/liquids; Diet toleration management by SLP; Respiratory muscle strength training Recommendations Recommendations for follow up therapy are one component of a multi-disciplinary discharge planning process, led by the attending physician.  Recommendations may be  updated based on patient status, additional functional criteria and insurance authorization. Assessment: Orofacial Exam: Orofacial Exam Oral Cavity: Oral Hygiene: WFL Oral Cavity - Dentition: Adequate natural dentition Orofacial Anatomy: WFL Oral Motor/Sensory Function: WFL Anatomy: Anatomy: Suspected cervical osteophytes Boluses Administered: Boluses Administered Boluses Administered: Thin liquids (Level 0); Mildly thick liquids (Level 2, nectar thick); Moderately thick liquids (Level 3, honey thick); Puree; Solid  Oral Impairment Domain: Oral Impairment Domain Lip Closure: No labial escape Tongue control during bolus hold: Posterior escape of less than half of bolus Bolus preparation/mastication: Timely and efficient chewing and mashing Bolus transport/lingual motion: Brisk tongue motion Oral residue: Complete oral clearance Location of oral residue : N/A Initiation of pharyngeal swallow : Pyriform sinuses  Pharyngeal Impairment Domain: Pharyngeal Impairment Domain Soft palate elevation: No bolus between soft palate (SP)/pharyngeal wall (PW) Laryngeal elevation: Complete superior movement of thyroid cartilage with complete approximation of arytenoids to epiglottic petiole Anterior hyoid excursion: Complete anterior movement Epiglottic movement: Complete inversion Laryngeal vestibule closure: Complete, no air/contrast in laryngeal vestibule Pharyngeal stripping wave : Present - complete Pharyngeal contraction (A/P view only): N/A Pharyngoesophageal segment opening: Complete distension and complete duration, no obstruction of flow Tongue base retraction: Trace column of contrast or air between  tongue base and PPW Pharyngeal residue: Collection of residue within or on pharyngeal structures Location of pharyngeal residue: Valleculae  Esophageal Impairment Domain: No data recorded Pill: No data recorded Penetration/Aspiration Scale Score: Penetration/Aspiration Scale Score 1.  Material does not enter airway: Moderately  thick liquids (Level 3, honey thick); Puree; Solid; Mildly thick liquids (Level 2, nectar thick) 8.  Material enters airway, passes BELOW cords without attempt by patient to eject out (silent aspiration) : Thin liquids (Level 0) Compensatory Strategies: Compensatory Strategies Compensatory strategies: Yes Straw: Ineffective Ineffective Straw: Thin liquid (Level 0)   General Information: Caregiver present: No  Diet Prior to this Study: NPO; Cortrak/Small bore NG tube   Temperature : Normal   Respiratory Status: WFL   Supplemental O2: Nasal cannula   History of Recent Intubation: Yes  Behavior/Cognition: Alert; Cooperative; Pleasant mood Self-Feeding Abilities: Able to self-feed Baseline vocal quality/speech: Dysphonic; Hypophonia/low volume Volitional Cough: Able to elicit Volitional Swallow: Able to elicit Exam Limitations: No limitations Goal Planning: Prognosis for improved oropharyngeal function: Good Barriers to Reach Goals: Time post onset No data recorded Patient/Family Stated Goal: wants to eat/drink Consulted and agree with results and recommendations: Patient; Nurse Pain: Pain Assessment Pain Assessment: Faces Faces Pain Scale: 0 Facial Expression: 0 Body Movements: 0 Muscle Tension: 0 Compliance with ventilator (intubated pts.): N/A Vocalization (extubated pts.): 0 CPOT Total: 0 Pain Location: pt reports back Pain Intervention(s): Monitored during session End of Session: Start Time:SLP Start Time (ACUTE ONLY): 1335 Stop Time: SLP Stop Time (ACUTE ONLY): 1352 Time Calculation:SLP Time Calculation (min) (ACUTE ONLY): 17 min Charges: SLP Evaluations $ SLP Speech Visit: 1 Visit SLP Evaluations $BSS Swallow: 1 Procedure $MBS Swallow: 1 Procedure SLP visit diagnosis: SLP Visit Diagnosis: Dysphagia, unspecified (R13.10) Past Medical History: Past Medical History: Diagnosis Date  Anemia   Arthritis 08/17/2020  Chronic back pain   Diabetes mellitus without complication (HCC)   type 2  GERD (gastroesophageal reflux  disease)   Migraine   Ovarian cyst   Pneumonia   Polyneuropathy  Past Surgical History: Past Surgical History: Procedure Laterality Date  CESAREAN SECTION    CHOLECYSTECTOMY  2004  LUMBAR LAMINECTOMY N/A 05/30/2020  Procedure: Lumbar five Laminectomy,  Bilateral Microdiscectomy, Left Lumbar five -Sacral one Microdiscectomy;  Surgeon: [name], MD;  Location: [facility] OR;  Service: Orthopedics;  Laterality: N/A;  LUMBAR LAMINECTOMY Left 11/14/2020  Procedure: LEFT LUMBAR FOUR-FIVE MICRODISCECTOMY;  Surgeon: [name], MD;  Location: [facility] OR;  Service: Orthopedics;  Laterality: Left;  TUBAL LIGATION  10/19/2010 [name], M.A., CF-SLP Speech Language Pathology, Acute Rehabilitation Services Secure Chat preferred [phone] 05/20/2023, 3:14 PM      Assessment/Plan: Diagnosis: Debility due to hypoxic respiratory failure secondary to influenza pneumonia and asthma exacerbation Does the need for close, 24 hr/day medical supervision in concert with the patient's rehab needs make it unreasonable for this patient to be served in a less intensive setting? Yes Co-Morbidities requiring supervision/potential complications:  -Acute hypoxic respiratory failure, acute metabolic encephalopathy, AKI, hyponatremia, normocytic anemia, oropharyngeal dysphagia, hypertension, diabetes mellitus type 2, chronic pain, mood disorder, obesity Due to bladder management, bowel management, safety, skin/wound care, disease management, medication administration, pain management, and patient education, does the patient require 24 hr/day rehab nursing? Yes Does the patient require coordinated care of a physician, rehab nurse, therapy disciplines of PT, OT, SLP to address physical and functional deficits in the context of the above medical diagnosis(es)? Yes Addressing deficits in the following areas: balance, endurance, locomotion, strength, transferring, bowel/bladder  control, bathing, dressing, feeding, grooming, toileting, cognition,  swallowing, and psychosocial support Can the patient actively participate in an intensive therapy program of at least 3 hrs of therapy per day at least 5 days per week? Yes The potential for patient to make measurable gains while on inpatient rehab is excellent Anticipated functional outcomes upon discharge from inpatient rehab are supervision  with PT, supervision with OT, modified independent with SLP. Estimated rehab length of stay to reach the above functional goals is: 10-12 Anticipated discharge destination: Home Overall Rehab/Functional Prognosis: excellent   POST ACUTE RECOMMENDATIONS: This patient's condition is appropriate for continued rehabilitative care in the following setting: CIR Patient has agreed to participate in recommended program. Yes Note that insurance prior authorization may be required for reimbursement for recommended care.   Comment: I think she be a good candidate for CIR, rehab coordinator to follow-up     MEDICAL RECOMMENDATIONS: Patient with orthostatic hypotension.,  Consider compression stockings, abdominal binder, encouraging hydration.   I have personally performed a face to face diagnostic evaluation of this patient. Additionally, I have examined the patient's medical record including any pertinent labs and radiographic images.     Thanks,   Fanny Dance, MD 05/21/2023             Routing History

## 2023-05-23 NOTE — H&P (Signed)
 Physical Medicine and Rehabilitation Admission H&P     CC: Functional deficits secondary to acute respiratory failure   HPI: Melinda Stone is 43 year old female who presented to [facility] ED on 05/11/2023 with generalized weakness, abdominal pain, orthopnea and BLE swelling.  Her past medical history significant for asthma, iron deficiency anemia due to menorrhagia, diabetes mellitus, polyneuropathy, tobacco use, arthritis.  Laboratory workup revealed serum potassium of 5.5.  BUN was 17 and creatinine 1.04 on admission.  Her hemoglobin was 6.9 and repeat value was 6.4 and 6.2.  Review of laboratory results dated 04/22/2023 revealed a normal iron/anemia profile.  She continued to have downward trends in H&H and was transfused a total of 4 units PRBCs over the course of her admission.  Her respiratory status deteriorated and pulmonary medicine was consulted.  She reported having sick contact and her son who was diagnosed with influenza.  Her influenza A was positive and she was started on ceftriaxone, azithromycin and Solu-Medrol.  She was placed on BiPAP, however she remained tachypneic and was transferred to the ICU.  Repeat chest x-ray revealed worsening bilateral pulmonary opacities.  Consented to CVC and A-line for ARDS management.  She was intubated on 2/23.  She was started on Tamiflu and completed a 7-day course.  She was extubated on 2/28 and required BiPAP for 24 hours.  She was treated with nebs and inhaled therapy for asthma. She was agitated and restless after extubation, treated for ICU delirium. She is on droplet precautions.  Core track was placed for dysphagia and tube feedings and removed on 3/04.  She is tolerating oral intake. Followed by SLP for dysphagia, upgraded to thin liquids 3/06.   Other workup includes CTA of the chest negative for PE but positive for multifocal pneumonia, MRI brain negative, quadrant ultrasound with chronic hepatomegaly otherwise unremarkable.  2D echo with EF 65 to  70%.  CT of the head without acute findings.  Respiratory virus panel, sputum culture, blood culture and tracheal aspirate cultures all negative.  PT OT and speech evaluations carried out.  She is progressing towards goals and is able to ambulate hallway distance with close chair following rolling walker.The patient requires inpatient medicine and rehabilitation evaluations and services for ongoing dysfunction secondary to respiratory failure.   Review of Systems  Constitutional:  Positive for malaise/fatigue. Negative for chills and fever.  HENT:  Negative for hearing loss.   Eyes:  Negative for blurred vision and double vision.  Respiratory:  Positive for cough and shortness of breath.   Cardiovascular:  Negative for chest pain.  Gastrointestinal:  Positive for diarrhea. Negative for abdominal pain, constipation, nausea and vomiting.  Genitourinary: Negative.   Musculoskeletal:  Positive for back pain and joint pain.  Skin:  Negative for rash.  Neurological:  Positive for sensory change (chronic neuropathy in her feet), weakness and headaches (chronic).        Past Medical History:  Diagnosis Date   Anemia     Arthritis 08/17/2020   Chronic back pain     Diabetes mellitus without complication (HCC)      type 2   GERD (gastroesophageal reflux disease)     Migraine     Ovarian cyst     Pneumonia     Polyneuropathy               Past Surgical History:  Procedure Laterality Date   CESAREAN SECTION       CHOLECYSTECTOMY   2004   LUMBAR LAMINECTOMY  N/A 05/30/2020    Procedure: Lumbar five Laminectomy,  Bilateral Microdiscectomy, Left Lumbar five -Sacral one Microdiscectomy;  Surgeon: [name], MD;  Location: [facility] OR;  Service: Orthopedics;  Laterality: N/A;   LUMBAR LAMINECTOMY Left 11/14/2020    Procedure: LEFT LUMBAR FOUR-FIVE MICRODISCECTOMY;  Surgeon: [name], MD;  Location: [facility] OR;  Service: Orthopedics;  Laterality: Left;   TUBAL LIGATION   10/19/2010              Family History  Problem Relation Age of Onset   Diabetes Mother     Hypertension Mother     Stroke Mother          Social History:  reports that she has been smoking cigars. She has never used smokeless tobacco. She reports that she does not currently use alcohol. She reports that she does not currently use drugs. Allergies:  Allergies      Allergies  Allergen Reactions   Trulicity [Dulaglutide] Diarrhea            Medications Prior to Admission  Medication Sig Dispense Refill   acetaminophen (TYLENOL) 500 MG tablet Take 500 mg by mouth every 6 (six) hours as needed for mild pain or moderate pain.       amitriptyline (ELAVIL) 25 MG tablet Take 1 tablet (25 mg total) by mouth at bedtime. 30 tablet 6   celecoxib (CELEBREX) 100 MG capsule Take 100 mg by mouth 2 (two) times daily as needed for mild pain (pain score 1-3) (take with food).       cholecalciferol (VITAMIN D3) 25 MCG (1000 UNIT) tablet Take 1,000 Units by mouth daily.       Cyanocobalamin (VITAMIN B-12) 2500 MCG SUBL Place 1 tablet (2,500 mcg total) under the tongue daily. 30 tablet 2   furosemide (LASIX) 20 MG tablet Take 1 tablet (20 mg total) by mouth daily. 6 tablet 0   gabapentin (NEURONTIN) 800 MG tablet Take 800 mg by mouth 3 (three) times daily.       glipiZIDE (GLUCOTROL) 10 MG tablet Take 10-20 mg by mouth See admin instructions. Take 20 mg by mouth before breakfast and 10 mg before supper       insulin aspart (NOVOLOG) 100 UNIT/ML FlexPen Before each meal 3 times a day, 140-199 - 2 units, 200-250 - 4 units, 251-299 - 8 units,  300-349 - 10 units,  350 or above 12 units. 15 mL 0   insulin glargine (LANTUS SOLOSTAR) 100 UNIT/ML Solostar Pen Inject 30 Units into the skin at bedtime. 9 mL 0   methocarbamol (ROBAXIN) 500 MG tablet Take 1 tablet (500 mg total) by mouth 2 (two) times daily. (Patient taking differently: Take 500 mg by mouth at bedtime.) 20 tablet 0   metoprolol tartrate (LOPRESSOR) 25 MG tablet Take 1  tablet (25 mg total) by mouth 2 (two) times daily. 60 tablet 0   ondansetron (ZOFRAN-ODT) 4 MG disintegrating tablet Take 1 tablet (4 mg total) by mouth every 6 (six) hours as needed for nausea or vomiting. 20 tablet 0   oxyCODONE-acetaminophen (PERCOCET/ROXICET) 5-325 MG tablet Take 1 tablet by mouth every 6 (six) hours as needed for moderate pain or severe pain.       tiZANidine (ZANAFLEX) 4 MG tablet Take 4 mg by mouth every 12 (twelve) hours as needed for muscle spasms (take with food).       Ubrogepant (UBRELVY) 100 MG TABS Take 1 tablet (100 mg total) by mouth as needed. Take 1  tablet at onset of headache, may repeat in 2 hours if needed. Max is 200 mg in 24 hours. 12 tablet 11   vitamin C (ASCORBIC ACID) 250 MG tablet Take 2 tablets (500 mg total) by mouth daily. (Patient taking differently: Take 250 mg by mouth daily.) 180 tablet 0              Home: Home Living Family/patient expects to be discharged to:: Private residence Living Arrangements:  (lives with children, 26 yom 39 yo and 68 yo dtr next door) Available Help at Discharge:  (reports 41 yo dtr can provide 24/7) Type of Home: Apartment Home Access: Level entry Home Layout: One level Bathroom Shower/Tub: Associate Professor: Yes Home Equipment: None  Lives With: Family   Functional History: Prior Function Prior Level of Function : Independent/Modified Independent Mobility Comments: reports not using AD, doesn't drive, children do her grocery shopping ADLs Comments: mod I   Functional Status:  Mobility: Bed Mobility Overal bed mobility: Needs Assistance Bed Mobility: Supine to Sit, Sit to Supine Supine to sit: Mod assist Sit to supine: Mod assist General bed mobility comments: In recliner Transfers Overall transfer level: Needs assistance Equipment used: Rolling walker (2 wheels) Transfers: Sit to/from Stand Sit to Stand: Min assist Bed to/from  chair/wheelchair/BSC transfer type:: Step pivot Step pivot transfers: Mod assist General transfer comment: min assist for boost to stand x2 today, reduced control with descent. Cues for hand placement, slow and effortful to rise. Ambulation/Gait General Gait Details: deferred due to hypotension and lethargy +dizziness   ADL: ADL Overall ADL's : Needs assistance/impaired Eating/Feeding: Set up Grooming: Set up, Wash/dry face, Standing Upper Body Bathing: Minimal assistance, Sitting Lower Body Bathing: Maximal assistance Upper Body Dressing : Minimal assistance, Sitting Upper Body Dressing Details (indicate cue type and reason): donning gown on backside Lower Body Dressing: Maximal assistance Toilet Transfer: Contact guard assist, Ambulation, Regular Toilet, Rolling walker (2 wheels) Toileting- Clothing Manipulation and Hygiene: Moderate assistance Functional mobility during ADLs: Contact guard assist, Rolling walker (2 wheels)   Cognition: Cognition Orientation Level: Oriented X4 Cognition Arousal: Alert Behavior During Therapy: WFL for tasks assessed/performed   Physical Exam: Blood pressure 129/74, pulse 86, temperature 98.6 F (37 C), temperature source Oral, resp. rate (!) 24, height 5\' 9"  (1.753 m), weight 98.1 kg, SpO2 96%. Physical Exam   General: No apparent distress, eating lunch HEENT: Head is normocephalic, atraumatic, sclera anicteric, oral mucosa pink and moist, decreased dentition Neck: Supple without JVD or lymphadenopathy Heart: Reg rate and rhythm. No murmurs rubs or gallops Chest: CTA bilaterally without wheezes, rales, or rhonchi; no distress. On RA. Occasional cough Abdomen: Soft, non-tender, non-distended, bowel sounds positive. Extremities: No clubbing, cyanosis, or edema. Pulses are 2+ Psych: Pt's affect is appropriate. Pt is cooperative Skin: Clean and intact without signs of breakdown Neuro:     Mental Status: AAOx3, memory grossly intact,  slightly  delayed responses Speech/Languate: Naming and repetition intact, fluent, follows simple commands CRANIAL NERVES: 2 through 12 grossly intact   MOTOR: RUE: 4/5 Deltoid, 4/5 Biceps, 4/5 Triceps,4/5 Grip LUE: 4/5 Deltoid, 4/5 Biceps, 4/5 Triceps, 4/5 Grip RLE: HF 4/5, KE 4/5, ADF 4/5, APF 4/5 LLE: HF 4/5, KE 4/5, ADF 4/5, APF 4/5   SENSORY: Altered in distal feet- chronic    Lab Results Last 48 Hours        Results for orders placed or performed during the hospital encounter of 05/11/23 (from the past 48 hours)  Glucose,  capillary     Status: Abnormal    Collection Time: 05/21/23 12:49 PM  Result Value Ref Range    Glucose-Capillary 122 (H) 70 - 99 mg/dL      Comment: Glucose reference range applies only to samples taken after fasting for at least 8 hours.  Glucose, capillary     Status: Abnormal    Collection Time: 05/21/23  4:17 PM  Result Value Ref Range    Glucose-Capillary 100 (H) 70 - 99 mg/dL      Comment: Glucose reference range applies only to samples taken after fasting for at least 8 hours.  Glucose, capillary     Status: Abnormal    Collection Time: 05/21/23  8:42 PM  Result Value Ref Range    Glucose-Capillary 140 (H) 70 - 99 mg/dL      Comment: Glucose reference range applies only to samples taken after fasting for at least 8 hours.    Comment 1 Notify RN      Comment 2 Document in Chart    Glucose, capillary     Status: Abnormal    Collection Time: 05/21/23 11:37 PM  Result Value Ref Range    Glucose-Capillary 108 (H) 70 - 99 mg/dL      Comment: Glucose reference range applies only to samples taken after fasting for at least 8 hours.    Comment 1 Notify RN      Comment 2 Document in Chart    Glucose, capillary     Status: None    Collection Time: 05/22/23  3:50 AM  Result Value Ref Range    Glucose-Capillary 80 70 - 99 mg/dL      Comment: Glucose reference range applies only to samples taken after fasting for at least 8 hours.    Comment 1 Notify RN       Comment 2 Document in Chart    Glucose, capillary     Status: Abnormal    Collection Time: 05/22/23  9:35 AM  Result Value Ref Range    Glucose-Capillary 130 (H) 70 - 99 mg/dL      Comment: Glucose reference range applies only to samples taken after fasting for at least 8 hours.  Glucose, capillary     Status: Abnormal    Collection Time: 05/22/23 11:56 AM  Result Value Ref Range    Glucose-Capillary 141 (H) 70 - 99 mg/dL      Comment: Glucose reference range applies only to samples taken after fasting for at least 8 hours.  Glucose, capillary     Status: Abnormal    Collection Time: 05/22/23  5:19 PM  Result Value Ref Range    Glucose-Capillary 164 (H) 70 - 99 mg/dL      Comment: Glucose reference range applies only to samples taken after fasting for at least 8 hours.  Glucose, capillary     Status: Abnormal    Collection Time: 05/22/23  8:36 PM  Result Value Ref Range    Glucose-Capillary 248 (H) 70 - 99 mg/dL      Comment: Glucose reference range applies only to samples taken after fasting for at least 8 hours.  Glucose, capillary     Status: Abnormal    Collection Time: 05/22/23 11:55 PM  Result Value Ref Range    Glucose-Capillary 239 (H) 70 - 99 mg/dL      Comment: Glucose reference range applies only to samples taken after fasting for at least 8 hours.    Comment 1 Notify RN  Comment 2 Document in Chart    Glucose, capillary     Status: Abnormal    Collection Time: 05/23/23  4:04 AM  Result Value Ref Range    Glucose-Capillary 189 (H) 70 - 99 mg/dL      Comment: Glucose reference range applies only to samples taken after fasting for at least 8 hours.    Comment 1 Notify RN      Comment 2 Document in Chart    Glucose, capillary     Status: Abnormal    Collection Time: 05/23/23  8:14 AM  Result Value Ref Range    Glucose-Capillary 132 (H) 70 - 99 mg/dL      Comment: Glucose reference range applies only to samples taken after fasting for at least 8 hours.       Imaging Results (Last 48 hours)  No results found.         Blood pressure 129/74, pulse 86, temperature 98.6 F (37 C), temperature source Oral, resp. rate (!) 24, height 5\' 9"  (1.753 m), weight 98.1 kg, SpO2 96%.   Medical Problem List and Plan: 1. Functional deficits secondary to Debility due to hypoxic respiratory failure secondary to influenza pneumonia and asthma exacerbation              -patient may shower             -ELOS/Goals: 5-7 days , mod I PT/OT/SLP             -Admit to CIR   2.  Antithrombotics: -DVT/anticoagulation:  Pharmaceutical: Heparin             -antiplatelet therapy: none   3. Pain Management: Tylenol, Flexeril as needed             -Per PDMP on percocet 5 and gabapentin prior to admission   4. Mood/Behavior/Sleep: LCSW to evaluate and provide emotional support             -continue Elavil 25 mg q HS             -antipsychotic agents: n/a   5. Neuropsych/cognition: This patient is capable of making decisions on her own behalf.   6. Skin/Wound Care: Routine skin care checks             -carb modified diet             -continue MVI, B-12   7. 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: CBGs QID; A1c = 5.0%             -continue SSI             -continue Lantus 30 units daily               CBG (last 3)  Recent Labs (last 2 labs)        Recent Labs    05/23/23 0404 05/23/23 0814 05/23/23 1150  GLUCAP 189* 132* 214*          10: Asthma/acute respiratory failure due to influenza -continue Brovana/Pulimcort/Yulperi nebs (consider conversion to inhalers) -continue incentive spirometry   11: GERD: continue Protonix   12: Polyneuropathy: continue gabapentin 400 mg q 8  hours   13: Acute on chronic anemia, hx of Fe  deficiency; status post 4 units PRBCs             -recheck CBC             -anemia has been worked up>>heavy menses, requires Venofer             -follow-up Dr. Cathie Hoops   14: Class 1 obesity             Body mass index is 31.94 kg/m.   15: Hypokalemia and prior hyerkalemia: follow-up BMP   16: AKI:  likely due to ATN, 3.02 --->1.22, now rising BUN/Cr back up to 62/1.47 on  3/4             -follow-up BMP   17. Oropharyngeal dysphagia:             -upgraded to Carb mod/thin diet, continue SLP          Milinda Antis, PA-C 05/23/2023     I have personally performed a face to face diagnostic evaluation of this patient and formulated the key components of the plan.  Additionally, I have personally reviewed laboratory data, imaging studies, as well as relevant notes and concur with the physician assistant's documentation above.   The patient's status has not changed from the original H&P.  Any changes in documentation from the acute care chart have been noted above.   Fanny Dance, MD, [place] [facility]

## 2023-05-23 NOTE — Progress Notes (Signed)
 Inpatient Rehabilitation Admissions Coordinator   I have insurance approval and CIR bed to admit her to today. Acute team and TOC made aware. I will contact patient and make the arrangements pending her approval.  Ottie Glazier, RN, MSN Rehab Admissions Coordinator [phone] 05/23/2023 9:48 AM

## 2023-05-23 NOTE — Progress Notes (Signed)
 Speech Language Pathology Treatment: Dysphagia  Patient Details Name: Melinda Stone MRN: [id number] DOB: 1980/03/27 Today's Date: 05/23/2023 Time: 4098-1191 SLP Time Calculation (min) (ACUTE ONLY): 9 min  Assessment / Plan / Recommendation Clinical Impression  Pt seen for readiness for skilled SLP for dysphagia management/dietary advancement.  She is alert, sitting on edge of bed. Denies pain nor nausea.    Pt today easily passed 3 ounce Yale water challenge, reports her voice is at normal strength.  MBS was conducted on 05/20/2023 and pt is 5 days post-extubation, thus acute dysphagia likely has resolved.  Does admit to occasional issues with coughing with intake at home and having occasional issues with reflux if she eats something acidic.   Swallow clinically appeared timely without evidence of retention.  Recommend advance diet to allow thin liquids with general precautions. Will follow up x1 given intubation, trace silent aspiration.  Reviewed swallow precautions with pt using teach back and provided swallow precaution signs.     At this time, recommend advance diet to allow thin liquids.   HPI HPI: BLESSEN KIMBROUGH is a 43 yo female presenting to ED 2/22 with complaints of nausea, vomiting, abdominal pain, and myalgias. Found to be Influenza A positive. CTA showed R predominant consolidative and nodular opacities. ETT 2/23-2/28. 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  Recommendations may be updated based on patient status, additional functional criteria and insurance authorization.    Recommendations  Diet  recommendations: Regular;Thin liquid Liquids provided via: Cup;Straw Medication Administration: Whole meds with liquid Supervision: Patient able to self feed Compensations: Slow rate;Small sips/bites Postural Changes and/or Swallow Maneuvers: Out of bed for meals;Seated upright 90 degrees                  Oral care BID   None       Continue with current plan of care    Rolena Infante, MS [facility] SLP Acute Rehab Services Office [phone]  Chales Abrahams  05/23/2023, 10:02 AM

## 2023-05-23 NOTE — Discharge Summary (Signed)
 Physician Discharge Summary  Melinda Stone ZOX:[id number] DOB: 1980/07/18 DOA: 05/11/2023  PCP: Anselm Jungling, NP  Admit date: 05/11/2023 Discharge date: 05/23/2023  Admitted From: home  Disposition:  CIR  Recommendations for Outpatient Follow-up:  Follow up with PCP in 1-2 weeks Please obtain BMP/CBC in one week  Discharge Condition:Stable.   CODE STATUS:Full code  Diet recommendation: low carb diet , nutritional supplements.   Discharge Summary: Patient is a 43 y.o.  female with history of asthma, DM-2, chronic pain syndrome, mood disorder-who presented to the ED with fever/chills/myalgias/nausea/vomiting (son recently positive for influenza)-patient was found to have influenza-and pneumonia on imaging studies-patient was started on antibiotic therapy and admitted to the hospitalist service-however post admission patient started developing shortness of breath-and on 2/23 developed worsening hypoxemia requiring intubation and transferred to the ICU.  She was stabilized in the ICU-extubated-postextubation-she required BiPAP for a few days-found to have ICU delirium and oropharyngeal dysphagia requiring NG tube feeding.  She was stabilized and transferred to [facility] on 3/3.    Significant events: 2/22>> subjective fever/chills/myalgias-influenza A positive-pneumonia on imaging-admit to [facility] 2/23>> worsening hypoxemia-transferred to ICU-intubated. 2/28>> extubated-but requiring BiPAP.Cortrak placed 3/03>> transferred to [facility].  Modified barium swallow-started on regular diet by SLP 3/04>> Cortrak removed 3/5>> adequate oral intake.    Significant studies: 2/22>> CTA chest: No PE-multifocal PNA-right lung predominant. 2/22>> MRI brain: No acute intracranial process. 2/23>> RUQ ultrasound: Chronic hepatomegaly-s/p cholecystectomy-unremarkable appearance otherwise. 2/24>> echo: EF 65-70%, grade 1 diastolic dysfunction 3/01>> CT head: No acute findings.   Significant microbiology  data: 2/22>> influenza A by PCR: Positive 2/22>> COVID/influenza B/RSV PCR: Negative 2/22>> respiratory virus panel: Negative 2/23>> sputum culture: Negative 2/23>> blood culture: Negative 2/25>> tracheal aspirate culture: Negative   Procedures: As above   Consults: PCCM   Patient medically stabilized. She can go to CIR today to get intensive rehab. Med rec as below.   Discharge Diagnoses:  Principal Problem:   Nausea and vomiting Active Problems:   Acute hypoxic respiratory failure (HCC)   Influenza A   ARDS (adult respiratory distress syndrome) (HCC)   On mechanically assisted ventilation (HCC)   Pneumonia due to infectious organism   Hyperglycemia   Debility   Type 2 diabetes mellitus with hypoglycemia without coma [facility])    Discharge Instructions  Discharge Instructions     Diet Carb Modified   Complete by: As directed    Increase activity slowly   Complete by: As directed       Allergies as of 05/23/2023       Reactions   Trulicity [dulaglutide] Diarrhea        Medication List     STOP taking these medications    celecoxib 100 MG capsule Commonly known as: CELEBREX   furosemide 20 MG tablet Commonly known as: LASIX   gabapentin 800 MG tablet Commonly known as: NEURONTIN Replaced by: gabapentin 400 MG capsule   glipiZIDE 10 MG tablet Commonly known as: GLUCOTROL   tiZANidine 4 MG tablet Commonly known as: ZANAFLEX       TAKE these medications    acetaminophen 500 MG tablet Commonly known as: TYLENOL Take 500 mg by mouth every 6 (six) hours as needed for mild pain or moderate pain.   amitriptyline 25 MG tablet Commonly known as: ELAVIL Take 1 tablet (25 mg total) by mouth at bedtime.   arformoterol 15 MCG/2ML Nebu Commonly known as: BROVANA Take 2 mLs (15 mcg total) by nebulization 2 (two) times daily.   budesonide 0.5 MG/2ML  nebulizer solution Commonly known as: PULMICORT Take 2 mLs (0.5 mg total) by nebulization 2 (two) times  daily.   cholecalciferol 25 MCG (1000 UNIT) tablet Commonly known as: VITAMIN D3 Take 1,000 Units by mouth daily.   cyclobenzaprine 5 MG tablet Commonly known as: FLEXERIL Take 1 tablet (5 mg total) by mouth 3 (three) times daily as needed for muscle spasms.   docusate sodium 100 MG capsule Commonly known as: COLACE Take 1 capsule (100 mg total) by mouth 2 (two) times daily.   gabapentin 400 MG capsule Commonly known as: NEURONTIN Take 1 capsule (400 mg total) by mouth every 8 (eight) hours. Replaces: gabapentin 800 MG tablet   Lantus SoloStar 100 UNIT/ML Solostar Pen Generic drug: insulin glargine Inject 30 Units into the skin at bedtime.   methocarbamol 500 MG tablet Commonly known as: ROBAXIN Take 1 tablet (500 mg total) by mouth 2 (two) times daily.   metoprolol tartrate 25 MG tablet Commonly known as: LOPRESSOR Take 1 tablet (25 mg total) by mouth 2 (two) times daily.   NovoLOG FlexPen 100 UNIT/ML FlexPen Generic drug: insulin aspart Before each meal 3 times a day, 140-199 - 2 units, 200-250 - 4 units, 251-299 - 8 units,  300-349 - 10 units,  350 or above 12 units.   ondansetron 4 MG disintegrating tablet Commonly known as: ZOFRAN-ODT Take 1 tablet (4 mg total) by mouth every 6 (six) hours as needed for nausea or vomiting.   oxyCODONE-acetaminophen 5-325 MG tablet Commonly known as: PERCOCET/ROXICET Take 1 tablet by mouth every 6 (six) hours as needed for moderate pain or severe pain.   pantoprazole 40 MG tablet Commonly known as: PROTONIX Take 1 tablet (40 mg total) by mouth daily.   Ubrelvy 100 MG Tabs Generic drug: Ubrogepant Take 1 tablet (100 mg total) by mouth as needed. Take 1 tablet at onset of headache, may repeat in 2 hours if needed. Max is 200 mg in 24 hours.   Vitamin B-12 2500 MCG Subl Place 1 tablet (2,500 mcg total) under the tongue daily.   vitamin C 250 MG tablet Commonly known as: ASCORBIC ACID Take 2 tablets (500 mg total) by mouth  daily.        Allergies  Allergen Reactions   Trulicity [Dulaglutide] Diarrhea     Procedures/Studies: DG Swallowing Func-Speech Pathology Result Date: 05/20/2023 Table formatting from the original result was not included. Modified Barium Swallow Study Patient Details Name: ILEEN KAHRE MRN: [id number] Date of Birth: 12/07/1980 Today's Date: 05/20/2023 HPI/PMH: HPI: CHARNIECE VENTURINO is a 42 yo female presenting to ED 2/22 with complaints of nausea, vomiting, abdominal pain, and myalgias. Found to be Influenza A positive. CTA showed R predominant consolidative and nodular opacities. ETT 2/23-2/28. 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 Boluses progress posteriorly to the level of the pyriform sinuses prior to swallow initiation, which intermittently allows thin liquids to enter her laryngeal vestibule. At times, they are immediately ejected by complete laryngeal elevation but other times, they progress quickly below the level of the vocal folds without being sensed (PAS 8). No further penetration or aspiration was observed with subsequent trials of nectar and honey thick liquids, purees, or solids. She initiates efficient mastication with complete oral clearance. Pt does have a collection of vallecular  residue with solids specifically, but a liquid wash was effective in clearing this. Recommend initiating diet of regular solids with nectar thick liquids. SLP will f/u to trial RMST and compensatory strategies to upgrade diet as clinically indicated. DIGEST Swallow Severity Rating*  Safety: 2  Efficiency: 2  Overall Pharyngeal Swallow Severity: 2 (moderate) 1: mild; 2: moderate; 3: severe; 4: profound *The Dynamic  Imaging Grade of Swallowing Toxicity is standardized for the head and neck cancer population, however, demonstrates promising clinical applications across populations to standardize the clinical rating of pharyngeal swallow safety and severity. Factors that may increase risk of adverse event in presence of aspiration [name] & Clearance [name] 2021): Factors that may increase risk of adverse event in presence of aspiration [name] & Clearance [name] 2021): Frail or deconditioned; Weak cough; Presence of tubes (ETT, trach, NG, etc.) Recommendations/Plan: Swallowing Evaluation Recommendations Swallowing Evaluation Recommendations Recommendations: PO diet PO Diet Recommendation: Regular; Mildly thick liquids (Level 2, nectar thick) Liquid Administration via: Cup; Straw Medication Administration: Whole meds with puree Supervision: Patient able to self-feed; Full supervision/cueing for swallowing strategies Swallowing strategies  : Minimize environmental distractions; Slow rate; Small bites/sips Postural changes: Position pt fully upright for meals Oral care recommendations: Oral care BID (2x/day) Caregiver Recommendations: Avoid jello, ice cream, thin soups, popsicles; Remove water pitcher Treatment Plan Treatment Plan Treatment recommendations: Therapy as outlined in treatment plan below Follow-up recommendations: Acute inpatient rehab (3 hours/day) Functional status assessment: Patient has had a recent decline in their functional status and demonstrates the ability to make significant improvements in function in a reasonable and predictable amount of time. Treatment frequency: Min 2x/week Treatment duration: 2 weeks Interventions: Aspiration precaution training; Compensatory techniques; Patient/family education; Trials of upgraded texture/liquids; Diet toleration management by SLP; Respiratory muscle strength training Recommendations Recommendations for follow up therapy are one component of a multi-disciplinary discharge planning  process, led by the attending physician.  Recommendations may be updated based on patient status, additional functional criteria and insurance authorization. Assessment: Orofacial Exam: Orofacial Exam Oral Cavity: Oral Hygiene: WFL Oral Cavity - Dentition: Adequate natural dentition Orofacial Anatomy: WFL Oral Motor/Sensory Function: WFL Anatomy: Anatomy: Suspected cervical osteophytes Boluses Administered: Boluses Administered Boluses Administered: Thin liquids (Level 0); Mildly thick liquids (Level 2, nectar thick); Moderately thick liquids (Level 3, honey thick); Puree; Solid  Oral Impairment Domain: Oral Impairment Domain Lip Closure: No labial escape Tongue control during bolus hold: Posterior escape of less than half of bolus Bolus preparation/mastication: Timely and efficient chewing and mashing Bolus transport/lingual motion: Brisk tongue motion Oral residue: Complete oral clearance Location of oral residue : N/A Initiation of pharyngeal swallow : Pyriform sinuses  Pharyngeal Impairment Domain: Pharyngeal Impairment Domain Soft palate elevation: No bolus between soft palate (SP)/pharyngeal wall (PW) Laryngeal elevation: Complete superior movement of thyroid cartilage with complete approximation of arytenoids to epiglottic petiole Anterior hyoid excursion: Complete anterior movement Epiglottic movement: Complete inversion Laryngeal vestibule closure: Complete, no air/contrast in laryngeal vestibule Pharyngeal stripping wave : Present - complete Pharyngeal contraction (A/P view only): N/A Pharyngoesophageal segment opening: Complete distension and complete duration, no obstruction of flow Tongue base retraction: Trace column of contrast or air between tongue base and PPW Pharyngeal residue: Collection of residue within or on pharyngeal structures Location of pharyngeal residue: Valleculae  Esophageal Impairment Domain: No data recorded Pill: No data recorded Penetration/Aspiration Scale Score:  Penetration/Aspiration Scale Score 1.  Material does not enter airway: Moderately thick liquids (Level 3, honey thick); Puree; Solid; Mildly thick liquids (Level 2, nectar thick) 8.  Material enters airway, passes BELOW cords without attempt by patient to eject out (silent aspiration) : Thin liquids (Level 0) Compensatory Strategies: Compensatory Strategies Compensatory strategies: Yes Straw: Ineffective Ineffective Straw: Thin liquid (Level 0)   General Information: Caregiver present: No  Diet Prior to this Study: NPO; Cortrak/Small bore NG tube   Temperature : Normal   Respiratory Status: WFL   Supplemental O2: Nasal cannula   History of Recent Intubation: Yes  Behavior/Cognition: Alert; Cooperative; Pleasant mood Self-Feeding Abilities: Able to self-feed Baseline vocal quality/speech: Dysphonic; Hypophonia/low volume Volitional Cough: Able to elicit Volitional Swallow: Able to elicit Exam Limitations: No limitations Goal Planning: Prognosis for improved oropharyngeal function: Good Barriers to Reach Goals: Time post onset No data recorded Patient/Family Stated Goal: wants to eat/drink Consulted and agree with results and recommendations: Patient; Nurse Pain: Pain Assessment Pain Assessment: Faces Faces Pain Scale: 0 Facial Expression: 0 Body Movements: 0 Muscle Tension: 0 Compliance with ventilator (intubated pts.): N/A Vocalization (extubated pts.): 0 CPOT Total: 0 Pain Location: pt reports back Pain Intervention(s): Monitored during session End of Session: Start Time:SLP Start Time (ACUTE ONLY): 1335 Stop Time: SLP Stop Time (ACUTE ONLY): 1352 Time Calculation:SLP Time Calculation (min) (ACUTE ONLY): 17 min Charges: SLP Evaluations $ SLP Speech Visit: 1 Visit SLP Evaluations $BSS Swallow: 1 Procedure $MBS Swallow: 1 Procedure SLP visit diagnosis: SLP Visit Diagnosis: Dysphagia, unspecified (R13.10) Past Medical History: Past Medical History: Diagnosis Date  Anemia   Arthritis 08/17/2020  Chronic back pain    Diabetes mellitus without complication (HCC)   type 2  GERD (gastroesophageal reflux disease)   Migraine   Ovarian cyst   Pneumonia   Polyneuropathy  Past Surgical History: Past Surgical History: Procedure Laterality Date  CESAREAN SECTION    CHOLECYSTECTOMY  2004  LUMBAR LAMINECTOMY N/A 05/30/2020  Procedure: Lumbar five Laminectomy,  Bilateral Microdiscectomy, Left Lumbar five -Sacral one Microdiscectomy;  Surgeon: [name], MD;  Location: [facility] OR;  Service: Orthopedics;  Laterality: N/A;  LUMBAR LAMINECTOMY Left 11/14/2020  Procedure: LEFT LUMBAR FOUR-FIVE MICRODISCECTOMY;  Surgeon: [name], MD;  Location: [facility] OR;  Service: Orthopedics;  Laterality: Left;  TUBAL LIGATION  10/19/2010 [name], M.A., CF-SLP Speech Language Pathology, Acute Rehabilitation Services Secure Chat preferred [phone] 05/20/2023, 3:14 PM  CT HEAD WO CONTRAST ( ) Result Date: 05/18/2023 CLINICAL DATA:  Delirium. Altered level of consciousness after extubation. EXAM: CT HEAD WITHOUT CONTRAST TECHNIQUE: Contiguous axial images were obtained from the base of the skull through the vertex without intravenous contrast. RADIATION DOSE REDUCTION: This exam was performed according to the departmental dose-optimization program which includes automated exposure control, adjustment of the mA and/or kV according to patient size and/or use of iterative reconstruction technique. COMPARISON:  06/08/2021 CT and brain MRI 05/11/2023 FINDINGS: Brain: No evidence of acute infarction, hemorrhage, hydrocephalus, extra-axial collection or mass lesion/mass effect. Vascular: No hyperdense vessel or unexpected calcification. Skull: Normal. Negative for fracture or focal lesion. Sinuses/Orbits: Extensive sinus opacification asymmetrically affecting the right-sided sinuses with fluid level/frothy secretions. Partial bilateral mastoid opacification. The patient is nasally intubated. IMPRESSION: 1. Normal appearance of the brain. 2. Active bilateral  sinusitis which is notably progressed from 05/11/2023. Electronically Signed   By: Tiburcio Pea M.D.   On: 05/18/2023 09:55   DG Abd Portable 1V Result Date: 05/17/2023 CLINICAL DATA:  Feeding tube placement EXAM: PORTABLE ABDOMEN - 1 VIEW COMPARISON:  05/12/2023 FINDINGS: Limited field of view for tube placement verification purposes. An enteric tube has been placed with tip in the  right upper quadrant consistent with location in the distal stomach or proximal duodenal. Mildly gas distended small bowel in the upper abdomen could represent obstruction, enteritis, or ileus. Clinical correlation recommended. Surgical clips in the right upper quadrant. Lung bases are clear. IMPRESSION: 1. Feeding tube tip projects over the expected location of the distal stomach or proximal duodenal. 2. Mildly dilated gas-filled small bowel may represent ileus, enteritis, or obstruction. Clinical correlation recommended. Electronically Signed   By: Burman Nieves M.D.   On: 05/17/2023 15:08   DG CHEST PORT 1 VIEW Result Date: 05/14/2023 CLINICAL DATA:  Hypoxia EXAM: PORTABLE CHEST 1 VIEW COMPARISON:  05/12/2023 FINDINGS: Endotracheal tube terminates 2.7 cm above the carina. Enteric tube courses into the stomach. Left IJ venous catheter terminates in the upper right atrium. Lungs are clear.  No pleural effusion or pneumothorax. The heart is normal in size. IMPRESSION: Endotracheal tube terminates 2.7 cm above the carina. Additional support apparatus as above. Electronically Signed   By: Charline Bills M.D.   On: 05/14/2023 23:42   ECHOCARDIOGRAM COMPLETE Result Date: 05/13/2023    ECHOCARDIOGRAM REPORT   Patient Name:   MINTA FAIR Date of Exam: 05/13/2023 Medical Rec #:  [id number]              Height:       69.0 in Accession #:    [id number]             Weight:       211.9 lb Date of Birth:  1980-11-14              BSA:          2.117 m Patient Age:    43 years               BP:           112/49 mmHg Patient  Gender: F                      HR:           90 bpm. Exam Location:  Inpatient Procedure: 2D Echo, Cardiac Doppler and Color Doppler (Both Spectral and Color            Flow Doppler were utilized during procedure). Indications:    CHF  History:        Patient has prior history of Echocardiogram examinations, most                 recent 09/08/2021. Acute hypoxic respiratory failure; Risk                 Factors:Diabetes and Current Smoker.  Sonographer:    Dondra Prader RVT RCS Referring Phys: Janann Colonel IMPRESSIONS  1. Left ventricular ejection fraction, by estimation, is 65 to 70%. The left ventricle has normal function. The left ventricle has no regional wall motion abnormalities. There is moderate left ventricular hypertrophy. Left ventricular diastolic parameters are consistent with Grade I diastolic dysfunction (impaired relaxation).  2. Right ventricular systolic function is normal. The right ventricular size is normal. Tricuspid regurgitation signal is inadequate for assessing PA pressure.  3. The mitral valve is normal in structure. Trivial mitral valve regurgitation. No evidence of mitral stenosis.  4. The aortic valve is tricuspid. Aortic valve regurgitation is not visualized. Aortic valve sclerosis/calcification is present, without any evidence of aortic stenosis.  5. The inferior vena cava is dilated in size with <50% respiratory variability, suggesting right atrial pressure  of 15 mmHg. FINDINGS  Left Ventricle: Left ventricular ejection fraction, by estimation, is 65 to 70%. The left ventricle has normal function. The left ventricle has no regional wall motion abnormalities. Strain imaging was not performed. The left ventricular internal cavity  size was normal in size. There is moderate left ventricular hypertrophy. Left ventricular diastolic parameters are consistent with Grade I diastolic dysfunction (impaired relaxation). Right Ventricle: The right ventricular size is normal. No increase in  right ventricular wall thickness. Right ventricular systolic function is normal. Tricuspid regurgitation signal is inadequate for assessing PA pressure. Left Atrium: Left atrial size was normal in size. Right Atrium: Right atrial size was normal in size. Pericardium: There is no evidence of pericardial effusion. Mitral Valve: The mitral valve is normal in structure. Trivial mitral valve regurgitation. No evidence of mitral valve stenosis. Tricuspid Valve: The tricuspid valve is normal in structure. Tricuspid valve regurgitation is trivial. No evidence of tricuspid stenosis. Aortic Valve: The aortic valve is tricuspid. Aortic valve regurgitation is not visualized. Aortic valve sclerosis/calcification is present, without any evidence of aortic stenosis. Aortic valve mean gradient measures 5.0 mmHg. Aortic valve peak gradient measures 9.6 mmHg. Aortic valve area, by VTI measures 2.68 cm. Pulmonic Valve: The pulmonic valve was normal in structure. Pulmonic valve regurgitation is trivial. No evidence of pulmonic stenosis. Aorta: The aortic root is normal in size and structure. Venous: The inferior vena cava is dilated in size with less than 50% respiratory variability, suggesting right atrial pressure of 15 mmHg. IAS/Shunts: No atrial level shunt detected by color flow Doppler. Additional Comments: 3D imaging was not performed.  LEFT VENTRICLE PLAX 2D LVIDd:         4.00 cm   Diastology LVIDs:         2.40 cm   LV e' medial:    9.14 cm/s LV PW:         1.30 cm   LV E/e' medial:  9.6 LV IVS:        1.50 cm   LV e' lateral:   9.68 cm/s LVOT diam:     2.00 cm   LV E/e' lateral: 9.0 LV SV:         72 LV SV Index:   34 LVOT Area:     3.14 cm  RIGHT VENTRICLE             IVC RV Basal diam:  3.90 cm     IVC diam: 2.20 cm RV Mid diam:    2.90 cm RV S prime:     16.20 cm/s TAPSE (M-mode): 2.2 cm LEFT ATRIUM             Index        RIGHT ATRIUM           Index LA diam:        3.30 cm 1.56 cm/m   RA Area:     13.75 cm LA Vol  (A2C):   31.9 ml 15.07 ml/m  RA Volume:   35.10 ml  16.58 ml/m LA Vol (A4C):   33.4 ml 15.78 ml/m LA Biplane Vol: 33.0 ml 15.59 ml/m  AORTIC VALVE                     PULMONIC VALVE AV Area (Vmax):    2.51 cm      PV Vmax:       0.78 m/s AV Area (Vmean):   2.45 cm      PV  Peak grad:  2.4 mmHg AV Area (VTI):     2.68 cm AV Vmax:           155.00 cm/s AV Vmean:          108.000 cm/s AV VTI:            0.270 m AV Peak Grad:      9.6 mmHg AV Mean Grad:      5.0 mmHg LVOT Vmax:         124.00 cm/s LVOT Vmean:        84.300 cm/s LVOT VTI:          0.230 m LVOT/AV VTI ratio: 0.85  AORTA Ao Root diam: 3.00 cm Ao Asc diam:  2.80 cm MITRAL VALVE MV Area (PHT): 3.76 cm    SHUNTS MV Decel Time: 202 msec    Systemic VTI:  0.23 m MV E velocity: 87.40 cm/s  Systemic Diam: 2.00 cm MV A velocity: 85.50 cm/s MV E/A ratio:  1.02 Arvilla Meres MD Electronically signed by Arvilla Meres MD Signature Date/Time: 05/13/2023/10:10:49 AM    Final    DG Abd 1 View Result Date: 05/12/2023 CLINICAL DATA:  Nasogastric tube placement. EXAM: ABDOMEN - 1 VIEW COMPARISON:  February 04, 2023. FINDINGS: Nasogastric tube tip seen in proximal stomach. IMPRESSION: Nasogastric tube tip seen in proximal stomach. Electronically Signed   By: Lupita Raider M.D.   On: 05/12/2023 13:23   DG CHEST PORT 1 VIEW Result Date: 05/12/2023 CLINICAL DATA:  Central line placement. EXAM: PORTABLE CHEST 1 VIEW COMPARISON:  May 12, 2023. FINDINGS: Stable cardiomediastinal silhouette. Endotracheal and nasogastric tubes are in grossly good position. Left internal jugular catheter is noted with distal tip in expected position of right atrium. Right lung opacity is noted concerning for asymmetric edema or pneumonia. Pneumothorax is noted IMPRESSION: Endotracheal and nasogastric tubes are noted. Left internal jugular catheter is noted with tip in expected position of right atrium. Right lung opacity is noted concerning for asymmetric edema or pneumonia.  Electronically Signed   By: Lupita Raider M.D.   On: 05/12/2023 13:23   US Abdomen Limited RUQ (LIVER/GB) Result Date: 05/12/2023 CLINICAL DATA:  43 year old female with hyperbilirubinemia. EXAM: ULTRASOUND ABDOMEN LIMITED RIGHT UPPER QUADRANT COMPARISON:  CT Abdomen and Pelvis 02/09/2023. FINDINGS: Gallbladder: Chronic cholecystectomy. Common bile duct: Diameter: Up to 5 mm, normal for the postoperative state. Liver: Chronic hepatomegaly better demonstrated by CT last year. No intrahepatic biliary ductal dilatation identified. Liver echogenicity at the upper limits of normal (image 28). No discrete liver lesion. Portal vein is patent on color Doppler imaging with normal direction of blood flow towards the liver. Other: Negative visible right kidney.  No free fluid. IMPRESSION: Chronic Hepatomegaly. Otherwise negative right upper quadrant Ultrasound appearance status post cholecystectomy. Electronically Signed   By: Odessa Fleming M.D.   On: 05/12/2023 10:58   DG CHEST PORT 1 VIEW Result Date: 05/12/2023 CLINICAL DATA:  Community acquired pneumonia. EXAM: PORTABLE CHEST 1 VIEW COMPARISON:  05/11/2023 FINDINGS: Bilateral airspace disease. The airspace disease in the left mid and left lower lung have increased since the previous examination. Heart size is prominent for size but likely accentuated by the AP portable technique. The trachea remains midline. Negative for pneumothorax. Gas within the stomach. IMPRESSION: Bilateral airspace disease. The airspace disease has markedly increased in the left lung. Findings are suggestive for pneumonia. Electronically Signed   By: Richarda Overlie M.D.   On: 05/12/2023 08:45   DG Chest  1 View Result Date: 05/11/2023 CLINICAL DATA:  [id number].  Respiratory distress. EXAM: CHEST  1 VIEW COMPARISON:  CTA chest earlier today at 10:49 a.m. FINDINGS: 7:49 p.m. There is mild cardiomegaly. Interstitial edema on the right-greater-than-left. Stable minimal pleural effusions. Nodular and patchy  airspace disease is worsened throughout the right lung fields in a patchy distribution with increased scattered airspace disease in the left mid to lower lung. Findings may reflect worsening alveolar edema and/or pneumonia. In all other respects no further changes. The mediastinum is stable.  Thoracic cage is intact. IMPRESSION: 1. Worsening nodular and patchy airspace disease throughout the right lung fields in a patchy distribution with increased scattered airspace disease in the left mid to lower lung. Findings may reflect worsening alveolar edema and/or pneumonia. 2. Stable minimal pleural effusions. 3. Continued right-greater-than-left interstitial edema. Electronically Signed   By: Almira Bar M.D.   On: 05/11/2023 21:22   MR BRAIN WO CONTRAST Result Date: 05/11/2023 CLINICAL DATA:  Headache, new onset (Age >= 51y) EXAM: MRI HEAD WITHOUT CONTRAST TECHNIQUE: Multiplanar, multiecho pulse sequences of the brain and surrounding structures were obtained without intravenous contrast. COMPARISON:  None Available. FINDINGS: Brain: Negative for an acute infarct. No hemorrhage. No hydrocephalus. No extra-axial fluid collection. No mass effect. No mass lesion. There are scattered periventricular and subcortical T2/FLAIR hyperintense lesions, favored to represent sequela of chronic microvascular ischemic change. Vascular: Normal flow voids. Skull and upper cervical spine: Normal marrow signal. Sinuses/Orbits: No middle ear or mastoid effusion. Paranasal sinuses are notable for mild pansinus mucosal thickening. Orbits are unremarkable. Other: None. IMPRESSION: No acute intracranial process. No specific etiology for headaches identified. Electronically Signed   By: Lorenza Cambridge M.D.   On: 05/11/2023 14:41   US PELVIC COMPLETE WITH TRANSVAGINAL Result Date: 05/11/2023 CLINICAL DATA:  [id number] Abdominal pain [id number] Endometrial mass [id number]. Last menstrual period 04/25/2023 EXAM: TRANSABDOMINAL AND TRANSVAGINAL  ULTRASOUND OF PELVIS TECHNIQUE: Both transabdominal and transvaginal ultrasound examinations of the pelvis were performed. Transabdominal technique was performed for global imaging of the pelvis including uterus, ovaries, adnexal regions, and pelvic cul-de-sac. It was necessary to proceed with endovaginal exam following the transabdominal exam to visualize the endometrium and bilateral ovaries. COMPARISON:  CT abdomen pelvis 02/09/2023 FINDINGS: Uterus Measurements: 9.3 x 5.9 x 7.5 cm = volume: 215.2 mL. 3.8 x 3.1 x 3 cm heterogeneous posterior right intramural mass likely uterine fibroid. Endometrium Thickness: 9 mm.  No focal abnormality visualized. Right ovary Measurements: 3.2 x 2 x 2.1 cm = volume: 7.1 mL. The right ovary is unremarkable. There is a tubular like anechoic structure within the right adnexa. Left ovary Measurements: 4.1 x 1.8 x 3.7 cm = volume: 14.1 mL. Normal appearance/no adnexal mass. Other findings Small volume simple free fluid within the pelvis. IMPRESSION: 1. Suggestion of right hydrosalpinx. 2. Trace simple free fluid within the pelvis. 3. Intramural uterine fibroid. Electronically Signed   By: Tish Frederickson M.D.   On: 05/11/2023 13:51   CT Angio Chest PE W and/or Wo Contrast Result Date: 05/11/2023 CLINICAL DATA:  Four day history of fever, cough, generalized body aches EXAM: CT ANGIOGRAPHY CHEST WITH CONTRAST TECHNIQUE: Multidetector CT imaging of the chest was performed using the standard protocol during bolus administration of intravenous contrast. Multiplanar CT image reconstructions and MIPs were obtained to evaluate the vascular anatomy. RADIATION DOSE REDUCTION: This exam was performed according to the departmental dose-optimization program which includes automated exposure control, adjustment of the mA and/or kV according to patient size and/or  use of iterative reconstruction technique. CONTRAST:  75mL OMNIPAQUE IOHEXOL 350 MG/ML SOLN COMPARISON:  Same day chest radiograph,  CTA chest dated 03/25/2023 FINDINGS: Cardiovascular: The study is adequate for the evaluation of pulmonary embolism to the level of the proximal subsegmental arteries due to contrast bolus timing and motion artifact. There are no filling defects in the central, lobar, segmental or proximal subsegmental pulmonary artery branches to suggest acute pulmonary embolism. Mildly dilated main pulmonary artery measures 3.4 cm in diameter, unchanged. Aberrant right subclavian artery. Normal heart size. No significant pericardial fluid/thickening. Mediastinum/Nodes: Imaged thyroid gland without nodules meeting criteria for imaging follow-up by size. Normal esophagus. Increased multi station mediastinal and hilar lymphadenopathy, for example 13 mm right low pretracheal (5:47), previously 9 mm, 20 mm right hilar (5:54), previously 14 mm, and 21 mm subcarinal (5:62), previously 14 mm. Lungs/Pleura: Trace layering secretions within the trachea and extending into left main bronchus. Mild diffuse bronchial wall thickening. Asymmetric right-greater-than-left interlobular septal thickening. New right lung predominant consolidative and nodular opacities within the right upper, middle, and lower lobes. Minimal ground-glass nodules within the left lower lobe. Previously noted peribronchovascular ground-glass opacities are no longer seen. No pneumothorax. Trace right pleural effusion. Upper abdomen: Mildly dilated hepatic veins measure up to 19 mm. Enlarged spleen measures 15.2 cm in AP dimension, previously 13.5 cm. Musculoskeletal: No acute or abnormal lytic or blastic osseous lesions. Review of the MIP images confirms the above findings. IMPRESSION: 1. No evidence of pulmonary embolism to the level of the proximal subsegmental pulmonary arteries. 2. New right lung-predominant consolidative and nodular opacities, likely multifocal pneumonia. 3. Asymmetric right-greater-than-left interlobular septal thickening and trace right pleural  effusion, likely pulmonary edema. 4. Increased multi station mediastinal and hilar lymphadenopathy, likely reactive. 5. Mildly dilated hepatic veins and spleen, which can be seen in the setting of right heart dysfunction. 6. Mildly dilated main pulmonary artery, which can be seen in the setting of pulmonary hypertension. Electronically Signed   By: Agustin Cree M.D.   On: 05/11/2023 11:15   DG Chest Portable 1 View Result Date: 05/11/2023 CLINICAL DATA:  Cough, fever. EXAM: PORTABLE CHEST 1 VIEW COMPARISON:  April 21, 2023. FINDINGS: Stable cardiomegaly. Mild central pulmonary vascular congestion is noted. Mild right upper lobe airspace opacity is noted concerning for pneumonia. Bony thorax is unremarkable. IMPRESSION: Stable cardiomegaly with mild central pulmonary vascular congestion. Mild right upper lobe opacity is noted concerning for pneumonia. Followup PA and lateral chest X-ray is recommended in 3-4 weeks following trial of antibiotic therapy to ensure resolution and exclude underlying malignancy. Electronically Signed   By: Lupita Raider M.D.   On: 05/11/2023 09:20   (Echo, Carotid, EGD, Colonoscopy, ERCP)    Subjective: patient seen and examined. No events overnight. Excited to go to rehab. Denies any swallowing issues.    Discharge Exam: Vitals:   05/23/23 0536 05/23/23 0815  BP: 126/68 129/74  Pulse: 85 86  Resp: 20 (!) 24  Temp: 98.4 F (36.9 C) 98.6 F (37 C)  SpO2: 95% 96%   Vitals:   05/22/23 2251 05/23/23 0510 05/23/23 0536 05/23/23 0815  BP: 137/80  126/68 129/74  Pulse: 95 (!) 102 85 86  Resp: 18 18 20  (!) 24  Temp: 98 F (36.7 C)  98.4 F (36.9 C) 98.6 F (37 C)  TempSrc: Oral  Oral Oral  SpO2: 96% 97% 95% 96%  Weight:  98.1 kg    Height:        General: Pt is alert,  awake, not in acute distress On room air.  Cardiovascular: RRR, S1/S2 +, no rubs, no gallops Respiratory: CTA bilaterally, no wheezing, no rhonchi Abdominal: Soft, NT, ND, bowel sounds  + Extremities: no edema, no cyanosis    The results of significant diagnostics from this hospitalization (including imaging, microbiology, ancillary and laboratory) are listed below for reference.     Microbiology: Recent Results (from the past 240 hours)  Culture, Respiratory w Gram Stain     Status: None   Collection Time: 05/14/23  3:59 PM   Specimen: Tracheal Aspirate; Respiratory  Result Value Ref Range Status   Specimen Description TRACHEAL ASPIRATE  Final   Special Requests NONE  Final   Gram Stain NO WBC SEEN NO ORGANISMS SEEN   Final   Culture   Final    NO GROWTH 2 DAYS Performed at [facility], [address], [place], [place] [address]    Report Status 05/17/2023 FINAL  Final     Labs: BNP (last 3 results) Recent Labs    04/21/23 1147 05/11/23 2028  BNP 238.5* 1,082.1*   Basic Metabolic Panel: Recent Labs  Lab 05/17/23 0343 05/18/23 0349 05/18/23 0749 05/19/23 0431 05/20/23 0852 05/21/23 0440  NA 150* 152* 155* 153* 151* 144  K 4.5 4.1 3.9 3.8 4.0 3.3*  CL 117* 114*  --  117* 114* 110  CO2 26 28  --  33* 25 25  GLUCOSE 204* 210*  --  230* 109* 60*  BUN 49* 48*  --  49* 56* 62*  CREATININE 1.83* 1.51*  --  1.24* 1.22* 1.47*  CALCIUM 8.7* 8.5*  --  7.8* 8.5* 8.2*  MG  --   --   --  2.0 2.1 2.0  PHOS  --   --   --   --  3.6 4.9*   Liver Function Tests: Recent Labs  Lab 05/20/23 0852 05/21/23 0440  ALBUMIN 2.6* 2.3*   No results for input(s): "LIPASE", "AMYLASE" in the last 168 hours. No results for input(s): "AMMONIA" in the last 168 hours. CBC: Recent Labs  Lab 05/17/23 0343 05/18/23 0349 05/18/23 0749 05/18/23 0755 05/19/23 0431 05/20/23 0852 05/21/23 0909  WBC 6.3 5.5  --   --  5.1 6.4 8.7  HGB 7.5* 7.3* 6.5* 7.2* 7.1* 8.6* 7.8*  HCT 23.6* 23.9* 19.0* 23.4* 23.5* 27.8* 24.4*  MCV 86.4 88.2  --   --  87.7 86.9 83.0  PLT 132* 184  --   --  231 298 304   Cardiac Enzymes: No results for input(s): "CKTOTAL", "CKMB",  "CKMBINDEX", "TROPONINI" in the last 168 hours. BNP: Invalid input(s): "POCBNP" CBG: Recent Labs  Lab 05/22/23 1719 05/22/23 2036 05/22/23 2355 05/23/23 0404 05/23/23 0814  GLUCAP 164* 248* 239* 189* 132*   D-Dimer No results for input(s): "DDIMER" in the last 72 hours. Hgb A1c No results for input(s): "HGBA1C" in the last 72 hours. Lipid Profile No results for input(s): "CHOL", "HDL", "LDLCALC", "TRIG", "CHOLHDL", "LDLDIRECT" in the last 72 hours. Thyroid function studies No results for input(s): "TSH", "T4TOTAL", "T3FREE", "THYROIDAB" in the last 72 hours.  Invalid input(s): "FREET3" Anemia work up No results for input(s): "VITAMINB12", "FOLATE", "FERRITIN", "TIBC", "IRON", "RETICCTPCT" in the last 72 hours. Urinalysis    Component Value Date/Time   COLORURINE AMBER (A) 05/11/2023 0612   APPEARANCEUR HAZY (A) 05/11/2023 0612   LABSPEC 1.015 05/11/2023 0612   PHURINE 5.0 05/11/2023 0612   GLUCOSEU 50 (A) 05/11/2023 0612   HGBUR LARGE (A)  05/11/2023 0612   BILIRUBINUR NEGATIVE 05/11/2023 0612   BILIRUBINUR small (A) 02/04/2023 1027   KETONESUR NEGATIVE 05/11/2023 0612   PROTEINUR >=300 (A) 05/11/2023 0612   UROBILINOGEN 0.2 02/04/2023 1027   NITRITE NEGATIVE 05/11/2023 0612   LEUKOCYTESUR NEGATIVE 05/11/2023 0612   Sepsis Labs Recent Labs  Lab 05/18/23 0349 05/19/23 0431 05/20/23 0852 05/21/23 0909  WBC 5.5 5.1 6.4 8.7   Microbiology Recent Results (from the past 240 hours)  Culture, Respiratory w Gram Stain     Status: None   Collection Time: 05/14/23  3:59 PM   Specimen: Tracheal Aspirate; Respiratory  Result Value Ref Range Status   Specimen Description TRACHEAL ASPIRATE  Final   Special Requests NONE  Final   Gram Stain NO WBC SEEN NO ORGANISMS SEEN   Final   Culture   Final    NO GROWTH 2 DAYS Performed at [facility], [address], [place], [place] [address]    Report Status 05/17/2023 FINAL  Final     Time coordinating discharge:   40 minutes  SIGNED:   Dorcas Carrow, MD  [facility] 05/23/2023, 10:12 AM

## 2023-05-23 NOTE — H&P (Signed)
 Physical Medicine and Rehabilitation Admission H&P   CC: Functional deficits secondary to acute respiratory failure  HPI: Melinda Stone is 43 year old female who presented to [facility] ED on 05/11/2023 with generalized weakness, abdominal pain, orthopnea and BLE swelling.  Her past medical history significant for asthma, iron deficiency anemia due to menorrhagia, diabetes mellitus, polyneuropathy, tobacco use, arthritis.  Laboratory workup revealed serum potassium of 5.5.  BUN was 17 and creatinine 1.04 on admission.  Her hemoglobin was 6.9 and repeat value was 6.4 and 6.2.  Review of laboratory results dated 04/22/2023 revealed a normal iron/anemia profile.  She continued to have downward trends in H&H and was transfused a total of 4 units PRBCs over the course of her admission.  Her respiratory status deteriorated and pulmonary medicine was consulted.  She reported having sick contact and her son who was diagnosed with influenza.  Her influenza A was positive and she was started on ceftriaxone, azithromycin and Solu-Medrol.  She was placed on BiPAP, however she remained tachypneic and was transferred to the ICU.  Repeat chest x-ray revealed worsening bilateral pulmonary opacities.  Consented to CVC and A-line for ARDS management.  She was intubated on 2/23.  She was started on Tamiflu and completed a 7-day course.  She was extubated on 2/28 and required BiPAP for 24 hours.  She was treated with nebs and inhaled therapy for asthma. She was agitated and restless after extubation, treated for ICU delirium. She is on droplet precautions.  Core track was placed for dysphagia and tube feedings and removed on 3/04.  She is tolerating oral intake. Followed by SLP for dysphagia, upgraded to thin liquids 3/06.   Other workup includes CTA of the chest negative for PE but positive for multifocal pneumonia, MRI brain negative, quadrant ultrasound with chronic hepatomegaly otherwise unremarkable.  2D echo with EF 65 to  70%.  CT of the head without acute findings.  Respiratory virus panel, sputum culture, blood culture and tracheal aspirate cultures all negative.  PT OT and speech evaluations carried out.  She is progressing towards goals and is able to ambulate hallway distance with close chair following rolling walker.The patient requires inpatient medicine and rehabilitation evaluations and services for ongoing dysfunction secondary to respiratory failure.  Review of Systems  Constitutional:  Positive for malaise/fatigue. Negative for chills and fever.  HENT:  Negative for hearing loss.   Eyes:  Negative for blurred vision and double vision.  Respiratory:  Positive for cough and shortness of breath.   Cardiovascular:  Negative for chest pain.  Gastrointestinal:  Positive for diarrhea. Negative for abdominal pain, constipation, nausea and vomiting.  Genitourinary: Negative.   Musculoskeletal:  Positive for back pain and joint pain.  Skin:  Negative for rash.  Neurological:  Positive for sensory change (chronic neuropathy in her feet), weakness and headaches (chronic).   Past Medical History:  Diagnosis Date   Anemia    Arthritis 08/17/2020   Chronic back pain    Diabetes mellitus without complication (HCC)    type 2   GERD (gastroesophageal reflux disease)    Migraine    Ovarian cyst    Pneumonia    Polyneuropathy    Past Surgical History:  Procedure Laterality Date   CESAREAN SECTION     CHOLECYSTECTOMY  2004   LUMBAR LAMINECTOMY N/A 05/30/2020   Procedure: Lumbar five Laminectomy,  Bilateral Microdiscectomy, Left Lumbar five -Sacral one Microdiscectomy;  Surgeon: [name], MD;  Location: [facility] OR;  Service: Orthopedics;  Laterality: N/A;   LUMBAR LAMINECTOMY Left 11/14/2020   Procedure: LEFT LUMBAR FOUR-FIVE MICRODISCECTOMY;  Surgeon: [name], MD;  Location: [facility] OR;  Service: Orthopedics;  Laterality: Left;   TUBAL LIGATION  10/19/2010   Family History  Problem Relation Age of Onset    Diabetes Mother    Hypertension Mother    Stroke Mother    Social History:  reports that she has been smoking cigars. She has never used smokeless tobacco. She reports that she does not currently use alcohol. She reports that she does not currently use drugs. Allergies:  Allergies  Allergen Reactions   Trulicity [Dulaglutide] Diarrhea   Medications Prior to Admission  Medication Sig Dispense Refill   acetaminophen (TYLENOL) 500 MG tablet Take 500 mg by mouth every 6 (six) hours as needed for mild pain or moderate pain.     amitriptyline (ELAVIL) 25 MG tablet Take 1 tablet (25 mg total) by mouth at bedtime. 30 tablet 6   celecoxib (CELEBREX) 100 MG capsule Take 100 mg by mouth 2 (two) times daily as needed for mild pain (pain score 1-3) (take with food).     cholecalciferol (VITAMIN D3) 25 MCG (1000 UNIT) tablet Take 1,000 Units by mouth daily.     Cyanocobalamin (VITAMIN B-12) 2500 MCG SUBL Place 1 tablet (2,500 mcg total) under the tongue daily. 30 tablet 2   furosemide (LASIX) 20 MG tablet Take 1 tablet (20 mg total) by mouth daily. 6 tablet 0   gabapentin (NEURONTIN) 800 MG tablet Take 800 mg by mouth 3 (three) times daily.     glipiZIDE (GLUCOTROL) 10 MG tablet Take 10-20 mg by mouth See admin instructions. Take 20 mg by mouth before breakfast and 10 mg before supper     insulin aspart (NOVOLOG) 100 UNIT/ML FlexPen Before each meal 3 times a day, 140-199 - 2 units, 200-250 - 4 units, 251-299 - 8 units,  300-349 - 10 units,  350 or above 12 units. 15 mL 0   insulin glargine (LANTUS SOLOSTAR) 100 UNIT/ML Solostar Pen Inject 30 Units into the skin at bedtime. 9 mL 0   methocarbamol (ROBAXIN) 500 MG tablet Take 1 tablet (500 mg total) by mouth 2 (two) times daily. (Patient taking differently: Take 500 mg by mouth at bedtime.) 20 tablet 0   metoprolol tartrate (LOPRESSOR) 25 MG tablet Take 1 tablet (25 mg total) by mouth 2 (two) times daily. 60 tablet 0   ondansetron (ZOFRAN-ODT) 4 MG  disintegrating tablet Take 1 tablet (4 mg total) by mouth every 6 (six) hours as needed for nausea or vomiting. 20 tablet 0   oxyCODONE-acetaminophen (PERCOCET/ROXICET) 5-325 MG tablet Take 1 tablet by mouth every 6 (six) hours as needed for moderate pain or severe pain.     tiZANidine (ZANAFLEX) 4 MG tablet Take 4 mg by mouth every 12 (twelve) hours as needed for muscle spasms (take with food).     Ubrogepant (UBRELVY) 100 MG TABS Take 1 tablet (100 mg total) by mouth as needed. Take 1 tablet at onset of headache, may repeat in 2 hours if needed. Max is 200 mg in 24 hours. 12 tablet 11   vitamin C (ASCORBIC ACID) 250 MG tablet Take 2 tablets (500 mg total) by mouth daily. (Patient taking differently: Take 250 mg by mouth daily.) 180 tablet 0      Home: Home Living Family/patient expects to be discharged to:: Private residence Living Arrangements:  (lives with children, 3 yom 19 yo and 52 yo  dtr next door) Available Help at Discharge:  (reports 41 yo dtr can provide 24/7) Type of Home: Apartment Home Access: Level entry Home Layout: One level Bathroom Shower/Tub: Associate Professor: Yes Home Equipment: None  Lives With: Family   Functional History: Prior Function Prior Level of Function : Independent/Modified Independent Mobility Comments: reports not using AD, doesn't drive, children do her grocery shopping ADLs Comments: mod I  Functional Status:  Mobility: Bed Mobility Overal bed mobility: Needs Assistance Bed Mobility: Supine to Sit, Sit to Supine Supine to sit: Mod assist Sit to supine: Mod assist General bed mobility comments: In recliner Transfers Overall transfer level: Needs assistance Equipment used: Rolling walker (2 wheels) Transfers: Sit to/from Stand Sit to Stand: Min assist Bed to/from chair/wheelchair/BSC transfer type:: Step pivot Step pivot transfers: Mod assist General transfer comment: min assist for boost  to stand x2 today, reduced control with descent. Cues for hand placement, slow and effortful to rise. Ambulation/Gait General Gait Details: deferred due to hypotension and lethargy +dizziness    ADL: ADL Overall ADL's : Needs assistance/impaired Eating/Feeding: Set up Grooming: Set up, Wash/dry face, Standing Upper Body Bathing: Minimal assistance, Sitting Lower Body Bathing: Maximal assistance Upper Body Dressing : Minimal assistance, Sitting Upper Body Dressing Details (indicate cue type and reason): donning gown on backside Lower Body Dressing: Maximal assistance Toilet Transfer: Contact guard assist, Ambulation, Regular Toilet, Rolling walker (2 wheels) Toileting- Clothing Manipulation and Hygiene: Moderate assistance Functional mobility during ADLs: Contact guard assist, Rolling walker (2 wheels)  Cognition: Cognition Orientation Level: Oriented X4 Cognition Arousal: Alert Behavior During Therapy: WFL for tasks assessed/performed  Physical Exam: Blood pressure 129/74, pulse 86, temperature 98.6 F (37 C), temperature source Oral, resp. rate (!) 24, height 5\' 9"  (1.753 m), weight 98.1 kg, SpO2 96%. Physical Exam  General: No apparent distress, eating lunch HEENT: Head is normocephalic, atraumatic, sclera anicteric, oral mucosa pink and moist, decrease dentition Neck: Supple without JVD or lymphadenopathy Heart: Reg rate and rhythm. No murmurs rubs or gallops Chest: CTA bilaterally without wheezes, rales, or rhonchi; no distress. On RA. Occasional cough Abdomen: Soft, non-tender, non-distended, bowel sounds positive. Extremities: No clubbing, cyanosis, or edema. Pulses are 2+ Psych: Pt's affect is appropriate. Pt is cooperative Skin: Clean and intact without signs of breakdown Neuro:     Mental Status: AAOx3, memory grossly intact,  slightly delayed responses Speech/Languate: Naming and repetition intact, fluent, follows simple commands CRANIAL NERVES: 2 through 12  grossly intact   MOTOR: RUE: 4/5 Deltoid, 4/5 Biceps, 4/5 Triceps,4/5 Grip LUE: 4/5 Deltoid, 4/5 Biceps, 4/5 Triceps, 4/5 Grip RLE: HF 4/5, KE 4/5, ADF 4/5, APF 4/5 LLE: HF 4/5, KE 4/5, ADF 4/5, APF 4/5   SENSORY: Altered in distal feet- chronic   Results for orders placed or performed during the hospital encounter of 05/11/23 (from the past 48 hours)  Glucose, capillary     Status: Abnormal   Collection Time: 05/21/23 12:49 PM  Result Value Ref Range   Glucose-Capillary 122 (H) 70 - 99 mg/dL    Comment: Glucose reference range applies only to samples taken after fasting for at least 8 hours.  Glucose, capillary     Status: Abnormal   Collection Time: 05/21/23  4:17 PM  Result Value Ref Range   Glucose-Capillary 100 (H) 70 - 99 mg/dL    Comment: Glucose reference range applies only to samples taken after fasting for at least 8 hours.  Glucose, capillary     Status:  Abnormal   Collection Time: 05/21/23  8:42 PM  Result Value Ref Range   Glucose-Capillary 140 (H) 70 - 99 mg/dL    Comment: Glucose reference range applies only to samples taken after fasting for at least 8 hours.   Comment 1 Notify RN    Comment 2 Document in Chart   Glucose, capillary     Status: Abnormal   Collection Time: 05/21/23 11:37 PM  Result Value Ref Range   Glucose-Capillary 108 (H) 70 - 99 mg/dL    Comment: Glucose reference range applies only to samples taken after fasting for at least 8 hours.   Comment 1 Notify RN    Comment 2 Document in Chart   Glucose, capillary     Status: None   Collection Time: 05/22/23  3:50 AM  Result Value Ref Range   Glucose-Capillary 80 70 - 99 mg/dL    Comment: Glucose reference range applies only to samples taken after fasting for at least 8 hours.   Comment 1 Notify RN    Comment 2 Document in Chart   Glucose, capillary     Status: Abnormal   Collection Time: 05/22/23  9:35 AM  Result Value Ref Range   Glucose-Capillary 130 (H) 70 - 99 mg/dL    Comment: Glucose  reference range applies only to samples taken after fasting for at least 8 hours.  Glucose, capillary     Status: Abnormal   Collection Time: 05/22/23 11:56 AM  Result Value Ref Range   Glucose-Capillary 141 (H) 70 - 99 mg/dL    Comment: Glucose reference range applies only to samples taken after fasting for at least 8 hours.  Glucose, capillary     Status: Abnormal   Collection Time: 05/22/23  5:19 PM  Result Value Ref Range   Glucose-Capillary 164 (H) 70 - 99 mg/dL    Comment: Glucose reference range applies only to samples taken after fasting for at least 8 hours.  Glucose, capillary     Status: Abnormal   Collection Time: 05/22/23  8:36 PM  Result Value Ref Range   Glucose-Capillary 248 (H) 70 - 99 mg/dL    Comment: Glucose reference range applies only to samples taken after fasting for at least 8 hours.  Glucose, capillary     Status: Abnormal   Collection Time: 05/22/23 11:55 PM  Result Value Ref Range   Glucose-Capillary 239 (H) 70 - 99 mg/dL    Comment: Glucose reference range applies only to samples taken after fasting for at least 8 hours.   Comment 1 Notify RN    Comment 2 Document in Chart   Glucose, capillary     Status: Abnormal   Collection Time: 05/23/23  4:04 AM  Result Value Ref Range   Glucose-Capillary 189 (H) 70 - 99 mg/dL    Comment: Glucose reference range applies only to samples taken after fasting for at least 8 hours.   Comment 1 Notify RN    Comment 2 Document in Chart   Glucose, capillary     Status: Abnormal   Collection Time: 05/23/23  8:14 AM  Result Value Ref Range   Glucose-Capillary 132 (H) 70 - 99 mg/dL    Comment: Glucose reference range applies only to samples taken after fasting for at least 8 hours.   No results found.    Blood pressure 129/74, pulse 86, temperature 98.6 F (37 C), temperature source Oral, resp. rate (!) 24, height 5\' 9"  (1.753 m), weight 98.1 kg, SpO2 96%.  Medical Problem List and Plan: 1. Functional deficits  secondary to Debility due to hypoxic respiratory failure secondary to influenza pneumonia and asthma exacerbation   -patient may shower  -ELOS/Goals: 5-7 days , mod I PT/OT/SLP  -Admit to CIR  2.  Antithrombotics: -DVT/anticoagulation:  Pharmaceutical: Heparin  -antiplatelet therapy: none  3. Pain Management: Tylenol, Flexeril as needed  -Per PDMP on percocet 5 and gabapentin prior to admission  4. Mood/Behavior/Sleep: LCSW to evaluate and provide emotional support  -continue Elavil 25 mg q HS  -antipsychotic agents: n/a  5. Neuropsych/cognition: This patient is capable of making decisions on her own behalf.  6. Skin/Wound Care: Routine skin care checks  -carb modified diet  -continue MVI, B-12   7. 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 Cathie Hoops  14: Class 1 obesity  Body mass index is 31.94 kg/m.   15: Hypokalemia and prior hyerkalemia: follow-up BMP  16: AKI:  likely due to ATN, 3.02 --->1.22, now rising BUN/Cr back up to 62/1.47 on  3/4  -follow-up BMP  17. Oropharyngeal dysphagia:  -upgraded to Carb mod/thin diet, continue SLP      Milinda Antis, PA-C 05/23/2023   I  have personally performed a face to face diagnostic evaluation of this patient and formulated the key components of the plan.  Additionally, I have personally reviewed laboratory data, imaging studies, as well as relevant notes and concur with the physician assistant's documentation above.  The patient's status has not changed from the original H&P.  Any changes in documentation from the acute care chart have been noted above.  Fanny Dance, MD, [place] [facility]

## 2023-05-23 NOTE — Plan of Care (Signed)
 Reviewing plan of care. Pt has been progressing. Afebrile, stable hemodynamically, on room air, normal respiratory effort. No acute distress noted overnight. She is able to sleep well, no complaints.  Problem: Education: Goal: Ability to describe self-care measures that may prevent or decrease complications (Diabetes Survival Skills Education) will improve Outcome: Progressing Goal: Individualized Educational Video(s) Outcome: Progressing   Problem: Coping: Goal: Ability to adjust to condition or change in health will improve Outcome: Progressing   Problem: Health Behavior/Discharge Planning: Goal: Ability to identify and utilize available resources and services will improve Outcome: Progressing Goal: Ability to manage health-related needs will improve Outcome: Progressing   Problem: Metabolic: Goal: Ability to maintain appropriate glucose levels will improve Outcome: Progressing   Problem: Nutritional: Goal: Maintenance of adequate nutrition will improve Outcome: Progressing Goal: Progress toward achieving an optimal weight will improve Outcome: Progressing   Problem: Clinical Measurements: Goal: Ability to maintain clinical measurements within normal limits will improve Outcome: Progressing Goal: Will remain free from infection Outcome: Progressing Goal: Diagnostic test results will improve Outcome: Progressing Goal: Respiratory complications will improve Outcome: Progressing Goal: Cardiovascular complication will be avoided Outcome: Progressing   Problem: Pain Managment: Goal: General experience of comfort will improve and/or be controlled Outcome: Progressing   Problem: Elimination: Goal: Will not experience complications related to bowel motility Outcome: Progressing Goal: Will not experience complications related to urinary retention Outcome: Progressing   Problem: Respiratory: Goal: Ability to maintain a clear airway and adequate ventilation will  improve Outcome: Progressing   Filiberto Pinks, RN

## 2023-05-24 ENCOUNTER — Ambulatory Visit: Payer: 59 | Admitting: Obstetrics & Gynecology

## 2023-05-24 DIAGNOSIS — N179 Acute kidney failure, unspecified: Secondary | ICD-10-CM

## 2023-05-24 DIAGNOSIS — D500 Iron deficiency anemia secondary to blood loss (chronic): Secondary | ICD-10-CM

## 2023-05-24 LAB — CBC WITH DIFFERENTIAL/PLATELET
Abs Immature Granulocytes: 0.16 10*3/uL — ABNORMAL HIGH (ref 0.00–0.07)
Basophils Absolute: 0.1 10*3/uL (ref 0.0–0.1)
Basophils Relative: 1 %
Eosinophils Absolute: 0 10*3/uL (ref 0.0–0.5)
Eosinophils Relative: 0 %
HCT: 21.5 % — ABNORMAL LOW (ref 36.0–46.0)
Hemoglobin: 7.2 g/dL — ABNORMAL LOW (ref 12.0–15.0)
Immature Granulocytes: 2 %
Lymphocytes Relative: 18 %
Lymphs Abs: 1.8 10*3/uL (ref 0.7–4.0)
MCH: 27.2 pg (ref 26.0–34.0)
MCHC: 33.5 g/dL (ref 30.0–36.0)
MCV: 81.1 fL (ref 80.0–100.0)
Monocytes Absolute: 0.6 10*3/uL (ref 0.1–1.0)
Monocytes Relative: 6 %
Neutro Abs: 7.7 10*3/uL (ref 1.7–7.7)
Neutrophils Relative %: 73 %
Platelets: 327 10*3/uL (ref 150–400)
RBC: 2.65 MIL/uL — ABNORMAL LOW (ref 3.87–5.11)
RDW: 18.7 % — ABNORMAL HIGH (ref 11.5–15.5)
WBC: 10.4 10*3/uL (ref 4.0–10.5)
nRBC: 0 % (ref 0.0–0.2)

## 2023-05-24 LAB — COMPREHENSIVE METABOLIC PANEL
ALT: 23 U/L (ref 0–44)
AST: 24 U/L (ref 15–41)
Albumin: 2.8 g/dL — ABNORMAL LOW (ref 3.5–5.0)
Alkaline Phosphatase: 92 U/L (ref 38–126)
Anion gap: 10 (ref 5–15)
BUN: 26 mg/dL — ABNORMAL HIGH (ref 6–20)
CO2: 21 mmol/L — ABNORMAL LOW (ref 22–32)
Calcium: 8.5 mg/dL — ABNORMAL LOW (ref 8.9–10.3)
Chloride: 106 mmol/L (ref 98–111)
Creatinine, Ser: 1.22 mg/dL — ABNORMAL HIGH (ref 0.44–1.00)
GFR, Estimated: 57 mL/min — ABNORMAL LOW (ref >60)
Glucose, Bld: 199 mg/dL — ABNORMAL HIGH (ref 70–99)
Potassium: 3.8 mmol/L (ref 3.5–5.1)
Sodium: 137 mmol/L (ref 135–145)
Total Bilirubin: 1.2 mg/dL (ref 0.0–1.2)
Total Protein: 6.3 g/dL — ABNORMAL LOW (ref 6.5–8.1)

## 2023-05-24 LAB — GLUCOSE, CAPILLARY
Glucose-Capillary: 179 mg/dL — ABNORMAL HIGH (ref 70–99)
Glucose-Capillary: 190 mg/dL — ABNORMAL HIGH (ref 70–99)
Glucose-Capillary: 251 mg/dL — ABNORMAL HIGH (ref 70–99)
Glucose-Capillary: 264 mg/dL — ABNORMAL HIGH (ref 70–99)

## 2023-05-24 MED ORDER — INSULIN GLARGINE 100 UNIT/ML ~~LOC~~ SOLN
33.0000 [IU] | Freq: Every day | SUBCUTANEOUS | Status: DC
  Administered 2023-05-25 – 2023-05-26 (×2): 33 [IU] via SUBCUTANEOUS
  Filled 2023-05-24 (×3): qty 0.33

## 2023-05-24 NOTE — IPOC Note (Signed)
 Overall Plan of Care [facility] Patient Details Name: Melinda Stone MRN: [id number] DOB: 28-Aug-1980  Admitting Diagnosis: Debility  Hospital Problems: Principal Problem:   Debility Active Problems:   Respiratory failure (HCC)     Functional Problem List: Nursing Safety, Endurance, Medication Management, Pain, Bowel  PT Balance, Endurance, Motor, Safety, Pain  OT Balance, Pain, Endurance  SLP    TR         Basic ADL's: OT Bathing, Dressing, Toileting     Advanced  ADL's: OT Simple Meal Preparation     Transfers: PT Bed Mobility, Bed to Chair, Car  OT Toilet, Tub/Shower     Locomotion: PT Ambulation, Stairs     Additional Impairments: OT    SLP        TR      Anticipated Outcomes Item Anticipated Outcome  Self Feeding Independent  Swallowing      Basic self-care  Modified Independent  Toileting  Modified Independent   Bathroom Transfers Modified Independent  Bowel/Bladder  manage bowel w mod I assist  Transfers  mod I  Locomotion  mod I  Communication     Cognition     Pain  <4 with prns  Safety/Judgment  manage safety w cues   Therapy Plan: PT Intensity: Minimum of 1-2 x/day ,45 to 90 minutes PT Frequency: 5 out of 7 days PT Duration Estimated Length of Stay: 5-7 days OT Intensity: Minimum of 1-2 x/day, 45 to 90 minutes OT Frequency: 5 out of 7 days OT Duration/Estimated Length of Stay: 5-10 days SLP Intensity:  (n/a) SLP Frequency:  (n/a) SLP Duration/Estimated Length of Stay: n/a   Team Interventions: Nursing Interventions Pain Management, Patient/Family Education, Medication Management, Discharge Planning, Bowel Management, Disease Management/Prevention  PT interventions Ambulation/gait training, Cognitive remediation/compensation, Discharge planning, DME/adaptive equipment instruction, Functional mobility training, Pain management, Psychosocial support, Therapeutic Activities, UE/LE Strength taining/ROM, UE/LE Coordination  activities, Wheelchair propulsion/positioning, Stair training, Therapeutic Exercise, Skin care/wound management, Patient/family education, Neuromuscular re-education, Functional electrical stimulation, Community reintegration, Warden/ranger, Disease management/prevention  OT Interventions Warden/ranger, Discharge planning, Pain management, Self Care/advanced ADL retraining, Therapeutic Activities, Disease mangement/prevention, Firefighter, Fish farm manager, UE/LE Strength taining/ROM, Therapeutic Exercise, Patient/family education, Functional mobility training  SLP Interventions  (n/a skilled ST not warranted)  TR Interventions    SW/CM Interventions Discharge Planning, Psychosocial Support, Patient/Family Education   Barriers to Discharge MD  Medical stability  Nursing Decreased caregiver support 1 level apt w 2 minor children, older dtr will assist with care as needed  PT Decreased caregiver support, Lack of/limited family support    OT      SLP      SW Decreased caregiver support, Community education officer for SNF coverage     Team Discharge Planning: Destination: PT-Home ,OT- Home , SLP-Home Projected Follow-up: PT-Outpatient PT, 24 hour supervision/assistance, OT-  Other (comment), SLP-None Projected Equipment Needs: PT-To be determined, OT- Tub/shower bench, To be determined, SLP-None recommended by SLP Equipment Details: PT- , OT-  Patient/family involved in discharge planning: PT- Patient,  OT-Patient, SLP-Patient  MD ELOS: 5-7 Medical Rehab Prognosis:  Excellent Assessment: The patient has been admitted for CIR therapies with the diagnosis of  Debility due to hypoxic respiratory failure secondary to influenza pneumonia and asthma exacerbation . The team will be addressing functional mobility, strength, stamina, balance, safety, adaptive techniques and equipment, self-care, bowel and bladder mgt, patient and caregiver education. Goals have  been set at Mod I. Anticipated discharge destination is Home.  See Team Conference Notes for weekly updates to the plan of care

## 2023-05-24 NOTE — Plan of Care (Signed)
  Problem: RH Balance Goal: LTG Patient will maintain dynamic standing balance (PT) Description: LTG:  Patient will maintain dynamic standing balance with assistance during mobility activities (PT) Flowsheets (Taken 05/24/2023 1416) LTG: Pt will maintain dynamic standing balance during mobility activities with:: Independent with assistive device    Problem: Sit to Stand Goal: LTG:  Patient will perform sit to stand with assistance level (PT) Description: LTG:  Patient will perform sit to stand with assistance level (PT) Flowsheets (Taken 05/24/2023 1416) LTG: PT will perform sit to stand in preparation for functional mobility with assistance level: Independent with assistive device   Problem: RH Bed Mobility Goal: LTG Patient will perform bed mobility with assist (PT) Description: LTG: Patient will perform bed mobility with assistance, with/without cues (PT). Flowsheets (Taken 05/24/2023 1416) LTG: Pt will perform bed mobility with assistance level of: Independent with assistive device    Problem: RH Bed to Chair Transfers Goal: LTG Patient will perform bed/chair transfers w/assist (PT) Description: LTG: Patient will perform bed to chair transfers with assistance (PT). Flowsheets (Taken 05/24/2023 1416) LTG: Pt will perform Bed to Chair Transfers with assistance level: Independent with assistive device    Problem: RH Car Transfers Goal: LTG Patient will perform car transfers with assist (PT) Description: LTG: Patient will perform car transfers with assistance (PT). Flowsheets (Taken 05/24/2023 1416) LTG: Pt will perform car transfers with assist:: Independent with assistive device    Problem: RH Ambulation Goal: LTG Patient will ambulate in controlled environment (PT) Description: LTG: Patient will ambulate in a controlled environment, # of feet with assistance (PT). Flowsheets (Taken 05/24/2023 1416) LTG: Pt will ambulate in controlled environ  assist needed:: Independent with assistive  device LTG: Ambulation distance in controlled environment: 150' Goal: LTG Patient will ambulate in home environment (PT) Description: LTG: Patient will ambulate in home environment, # of feet with assistance (PT). Flowsheets (Taken 05/24/2023 1416) LTG: Pt will ambulate in home environ  assist needed:: Independent with assistive device LTG: Ambulation distance in home environment: 72'

## 2023-05-24 NOTE — Progress Notes (Addendum)
 PROGRESS NOTE   Subjective/Complaints: Patient reports occasional cough, nonproductive.  Occasional muscle spasms.  She reports pain is overall under control.  ROS: Patient denies fever, new vision changes, dizziness, nausea, vomiting, diarrhea,  chest pain, headache, or mood change + Shortness of breath improving. +cough + Chronic loose bowel movement  Objective:   No results found. Recent Labs    05/21/23 0909  WBC 8.7  HGB 7.8*  HCT 24.4*  PLT 304   No results for input(s): "NA", "K", "CL", "CO2", "GLUCOSE", "BUN", "CREATININE", "CALCIUM" in the last 72 hours.  Intake/Output Summary (Last 24 hours) at 05/24/2023 0855 Last data filed at 05/24/2023 0726 Gross per 24 hour  Intake 457 ml  Output --  Net 457 ml        Physical Exam: Vital Signs Blood pressure 134/84, pulse 93, temperature 98 F (36.7 C), resp. rate 17, height (P) 5\' 9"  (1.753 m), weight 95.1 kg, SpO2 98%.   General: No apparent distress HEENT: Head is normocephalic, atraumatic, sclera anicteric, oral mucosa pink and moist Neck: Supple without JVD or lymphadenopathy Heart: Reg rate and rhythm. Chest: CTA bilaterally, Occasional Dry cough, non-labored breathign Abdomen: Soft, non-tender, non-distended, bowel sounds positive. Extremities: No clubbing, cyanosis, or edema. Pulses are 2+ Psych: Pt's affect is appropriate. Pt is cooperative Skin: Clean and intact without signs of breakdown Neuro:     Mental Status: AAOx3, memory grossly intact Speech/Languate: Follows simple commands CRANIAL NERVES: 2 through 12 grossly intact   MOTOR: RUE: 4/5 Deltoid, 4/5 Biceps, 4/5 Triceps,4/5 Grip LUE: 4/5 Deltoid, 4/5 Biceps, 4/5 Triceps, 4/5 Grip RLE: HF 4/5, KE 4/5, ADF 4/5, APF 4/5 LLE: HF 4/5, KE 4/5, ADF 4/5, APF 4/5   SENSORY: Altered in distal feet- chronic  Assessment/Plan: 1. Functional deficits which require 3+ hours per day of  interdisciplinary therapy in a comprehensive inpatient rehab setting. Physiatrist is providing close team supervision and 24 hour management of active medical problems listed below. Physiatrist and rehab team continue to assess barriers to discharge/monitor patient progress toward functional and medical goals  Care Tool:  Bathing              Bathing assist       Upper Body Dressing/Undressing Upper body dressing        Upper body assist      Lower Body Dressing/Undressing Lower body dressing            Lower body assist       Toileting Toileting    Toileting assist       Transfers Chair/bed transfer  Transfers assist           Locomotion Ambulation   Ambulation assist              Walk 10 feet activity   Assist           Walk 50 feet activity   Assist           Walk 150 feet activity   Assist           Walk 10 feet on uneven surface  activity   Assist  Wheelchair     Assist               Wheelchair 50 feet with 2 turns activity    Assist            Wheelchair 150 feet activity     Assist          Blood pressure 134/84, pulse 93, temperature 98 F (36.7 C), resp. rate 17, height (P) 5\' 9"  (1.753 m), weight 95.1 kg, SpO2 98%.  Medical Problem List and Plan: 1. Functional deficits secondary to Debility due to hypoxic respiratory failure secondary to influenza pneumonia and asthma exacerbation              -patient may shower             -ELOS/Goals: 5-7 days , mod I PT/OT/SLP             -Admit to CIR  -Continue Robitussin DM as needed.  Discussed Mucinex but patient reports that this upsets her stomach.  She reports she has tolerated Robitussin DM, reviewed this also has guaifenesin.   2.  Antithrombotics: -DVT/anticoagulation:  Pharmaceutical: Heparin             -antiplatelet therapy: none   3. Pain Management: Tylenol, Flexeril as needed  -Patient reports she  has not needed outpatient pain medications for the past years, appears oxycodone ordered by PDMP?  -Discussed magnesium for muscle cramps, she declines due to concern of diarrhea   4. Mood/Behavior/Sleep: LCSW to evaluate and provide emotional support             -continue Elavil 25 mg q HS             -antipsychotic agents: n/a   5. Neuropsych/cognition: This patient is capable of making decisions on her own behalf.   6. Skin/Wound Care: Routine skin care checks             -carb modified diet             -continue MVI, B-12   7. 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: CBGs QID; A1c = 5.0%             -continue SSI             -continue Lantus 30 units daily  -Increase Lantus to 33u daily   CBG (last 3)  Recent Labs    05/23/23 2103 05/24/23 0556 05/24/23 1134  GLUCAP 193* 179* 190*      10: Asthma/acute respiratory failure due to influenza -continue Brovana/Pulimcort/Yulperi nebs (consider conversion to inhalers) -continue incentive spirometry   11: GERD: continue Protonix   12: Polyneuropathy: continue gabapentin 400 mg q 8 hours   13: Acute on chronic anemia, hx of Fe deficiency; status post 4 units PRBCs             -  recheck CBC             -anemia has been worked up>>heavy menses, requires Venofer             -follow-up Dr. Cathie Hoops  -7/3 HGB down to 7.2, similar level on 3/2.  May be more fluid status/dilutional related.  Recheck tomorrow.   14: Class 1 obesity             Body mass index is 31.94 kg/m.   15: Hypokalemia and prior  hyerkalemia: follow-up BMP  -CMP pending   16: AKI:  likely due to ATN, 3.02 --->1.22, now rising BUN/Cr back up to 62/1.47 on  3/4             -CMP results pending   17. Oropharyngeal dysphagia:             -upgraded to Carb mod/thin diet, continue SLP     LOS: 1 days A FACE TO FACE EVALUATION WAS PERFORMED  Fanny Dance 05/24/2023, 8:55 AM

## 2023-05-24 NOTE — Evaluation (Signed)
 Speech Language Pathology Assessment and Plan  Patient Details  Name: Melinda Stone MRN: [id number] Date of Birth: 05/16/1980  SLP Diagnosis:   n/a Rehab Potential:  n/a Skilled ST not warranted ELOS: n/a skilled ST not warranted  Today's Date: 05/24/2023 SLP Individual Time: 1110-1151 SLP Individual Time Calculation (min): 41 min   Hospital Problem: Principal Problem:   Debility Active Problems:   Respiratory failure (HCC)  Past Medical History:  Past Medical History:  Diagnosis Date   Anemia    Arthritis 08/17/2020   Chronic back pain    Diabetes mellitus without complication (HCC)    type 2   GERD (gastroesophageal reflux disease)    Migraine    Ovarian cyst    Pneumonia    Polyneuropathy    Past Surgical History:  Past Surgical History:  Procedure Laterality Date   CESAREAN SECTION     CHOLECYSTECTOMY  2004   LUMBAR LAMINECTOMY N/A 05/30/2020   Procedure: Lumbar five Laminectomy,  Bilateral Microdiscectomy, Left Lumbar five -Sacral one Microdiscectomy;  Surgeon: [name], MD;  Location: [facility] OR;  Service: Orthopedics;  Laterality: N/A;   LUMBAR LAMINECTOMY Left 11/14/2020   Procedure: LEFT LUMBAR FOUR-FIVE MICRODISCECTOMY;  Surgeon: [name], MD;  Location: [facility] OR;  Service: Orthopedics;  Laterality: Left;   TUBAL LIGATION  10/19/2010    Assessment / Plan / Recommendation Clinical Impression Bedside Swallow Eval: PO trials completed with thin liquids via straw and regular textures (goldfish). Infrequent productive cough noted before PO trials and continued throughout PO trials. However, no increase in frequency noted with oral intake. Adequate vocal quality and loudness noted throughout conversation. Anticipate initial post extubation has resolved at this time. Recommend cont regular diet/thin liquids. No dysphagia intervention warranted.   Cognitive-linguistic Eval: Pt completed the [employer] Mental Status (SLUMS) Exam. She scored well above  functional limits with a 30/30. Throughout tasks, speech was clear and intelligible and verbal expression was [facility]. Overall, she demonstrates adequate cognition to return to all prev roles/responsibilities. Skilled ST not warranted.     Skilled Therapeutic Interventions          SLP facilitated a cognitive-linguistic and swallow evaluation to assess pt's cognitive-communication skills and determine need for additional skilled ST services. See above for more information.    SLP Assessment  Patient does not need any further Speech Lanaguage Pathology Services    Recommendations  SLP Diet Recommendations: Thin;Age appropriate regular solids Liquid Administration via: Straw Medication Administration: Whole meds with liquid Supervision: Patient able to self feed Compensations: Slow rate;Small sips/bites Postural Changes and/or Swallow Maneuvers: Out of bed for meals;Seated upright 90 degrees Oral Care Recommendations: Oral care BID Patient destination: Home Follow up Recommendations: None Equipment Recommended: None recommended by SLP    SLP Frequency  (n/a)   SLP Duration  SLP Intensity  SLP Treatment/Interventions n/a   (n/a)   (n/a skilled ST not warranted)    Pain No pain reported  Prior Functioning Cognitive/Linguistic Baseline: Within functional limits Type of Home: Apartment  Lives With: Family Available Help at Discharge: (P) Family Vocation: On disability  SLP Evaluation Cognition Overall Cognitive Status: Within Functional Limits for tasks assessed Arousal/Alertness: Awake/alert Orientation Level: Oriented X4 Year: 2025 Month: March Day of Week: Correct Attention: Selective;Sustained;Alternating Sustained Attention: Appears intact Selective Attention: Appears intact Alternating Attention: Appears intact Memory: Appears intact Awareness: Appears intact Problem Solving: Appears intact Executive Function: Decision  Making;Reasoning;Sequencing;Organizing Reasoning: Appears intact Sequencing: Appears intact Organizing: Appears intact Decision Making: Appears  intact Safety/Judgment: Appears intact  Comprehension Auditory Comprehension Overall Auditory Comprehension: Appears within functional limits for tasks assessed Expression Expression Primary Mode of Expression: Verbal Verbal Expression Overall Verbal Expression: Appears within functional limits for tasks assessed Written Expression Dominant Hand: Right Written Expression: Within Functional Limits Oral Motor Oral Motor/Sensory Function Overall Oral Motor/Sensory Function: Within functional limits Motor Speech Overall Motor Speech: Appears within functional limits for tasks assessed  Care Tool Care Tool Cognition Ability to hear (with hearing aid or hearing appliances if normally used Ability to hear (with hearing aid or hearing appliances if normally used): 0. Adequate - no difficulty in normal conservation, social interaction, listening to TV   Expression of Ideas and Wants Expression of Ideas and Wants: 4. Without difficulty (complex and basic) - expresses complex messages without difficulty and with speech that is clear and easy to understand   Understanding Verbal and Non-Verbal Content Understanding Verbal and Non-Verbal Content: 4. Understands (complex and basic) - clear comprehension without cues or repetitions  Memory/Recall Ability Memory/Recall Ability : Current season;Location of own room;Staff names and faces;That he or she is in a hospital/hospital unit   Motor Speech Assessment  WFL  Bedside Swallowing Assessment General Date of Onset: 05/11/23 Previous Swallow Assessment: MBSS 3/3 recommended NTL d/t trace aspiration of thin liquids Diet Prior to this Study: Regular;Thin liquids (Level 0) History of Recent Intubation: Yes Total duration of intubation (days): 6 days Date extubated: 05/17/23 Behavior/Cognition:  Alert;Cooperative;Pleasant mood Oral Cavity - Dentition: Edentulous Self-Feeding Abilities: Able to feed self Vision: Functional for self-feeding Patient Positioning: Upright in bed Baseline Vocal Quality: Normal Volitional Cough: Strong Volitional Swallow: Able to elicit  Oral Care Assessment Oral Assessment  (WDL): Exceptions to WDL Lips: Symmetrical Teeth: Missing (Comment) Tongue: Pink Mucous Membrane(s): Moist Saliva: Moist, saliva free flowing Level of Consciousness: Alert Is patient on any of following O2 devices?: None of the above Nutritional status: No high risk factors Oral Assessment Risk : Low Risk   Short Term Goals: Week 1: SLP Short Term Goal 1 (Week 1): n/a skilled ST not warranted  Refer to Care Plan for Long Term Goals  Recommendations for other services: None   Discharge Criteria: Patient will be discharged from SLP if patient refuses treatment 3 consecutive times without medical reason, if treatment goals not met, if there is a change in medical status, if patient makes no progress towards goals or if patient is discharged from hospital.  The above assessment, treatment plan, treatment alternatives and goals were discussed and mutually agreed upon: by patient  Pati Gallo 05/24/2023, 12:06 PM

## 2023-05-24 NOTE — Progress Notes (Signed)
 Inpatient Rehabilitation Care Coordinator Assessment and Plan Patient Details  Name: Melinda Stone MRN: [id number] Date of Birth: 01/15/1981  Today's Date: 05/24/2023  Hospital Problems: Principal Problem:   Debility Active Problems:   Respiratory failure [facility])  Past Medical History:  Past Medical History:  Diagnosis Date   Anemia    Arthritis 08/17/2020   Chronic back pain    Diabetes mellitus without complication (HCC)    type 2   GERD (gastroesophageal reflux disease)    Migraine    Ovarian cyst    Pneumonia    Polyneuropathy    Past Surgical History:  Past Surgical History:  Procedure Laterality Date   CESAREAN SECTION     CHOLECYSTECTOMY  2004   LUMBAR LAMINECTOMY N/A 05/30/2020   Procedure: Lumbar five Laminectomy,  Bilateral Microdiscectomy, Left Lumbar five -Sacral one Microdiscectomy;  Surgeon: [name], MD;  Location: [facility] OR;  Service: Orthopedics;  Laterality: N/A;   LUMBAR LAMINECTOMY Left 11/14/2020   Procedure: LEFT LUMBAR FOUR-FIVE MICRODISCECTOMY;  Surgeon: [name], MD;  Location: [facility] OR;  Service: Orthopedics;  Laterality: Left;   TUBAL LIGATION  10/19/2010   Social History:  reports that she has been smoking cigars. She has never used smokeless tobacco. She reports that she does not currently use alcohol. She reports that she does not currently use drugs.  Family / Support Systems Marital Status: Single Patient Roles: Parent, Other (Comment) (sibling) Children: Meliton Rattan yo daughter [phone]  has 81 yo son and 71 yo daughter daughter's friends 69-15 yo also stay with them Other Supports: Tyrone-brother [phone]  Nicola Girt [phone] Anticipated Caregiver: Daughter Ability/Limitations of Caregiver: Pt needs to be mod/i before going home due to children are young and go to school and 3 yo daughter works Engineer, structural Availability: Other (Comment) (75 yo daughter lives in same complex as Mom does. Needs to be mod/i by DC) Family  Dynamics: Close with all of her children and her two 45 yo wjho stay with them. She has siblings who are supportive and will check on her and the kids  Social History Preferred language: English Religion: None Cultural Background: NA Education: HS Health Literacy - How often do you need to have someone help you when you read instructions, pamphlets, or other written material from your doctor or pharmacy?: Never Writes: Yes Employment Status: Disabled Marine scientist Issues: No issues Guardian/Conservator: None-according to MD pt is capable of making her own decisions while here   Abuse/Neglect Abuse/Neglect Assessment Can Be Completed: Yes Physical Abuse: Denies Verbal Abuse: Denies Sexual Abuse: Denies Exploitation of patient/patient's resources: Denies Self-Neglect: Denies  Patient response to: Social Isolation - How often do you feel lonely or isolated from those around you?: Never  Emotional Status Pt's affect, behavior and adjustment status: Pt is motivated to do well and needs to get stronger after her health issues which brought her here. She was doing well and was independent prior to being hospitalized. She hopes to get back to this level Recent Psychosocial Issues: other health issues which she is disabled from and not able to work Psychiatric History: Hx-modd disorder and takes medications for this and finds it helpful She may benefit from seeing neuro-psych while here if here long enough she is high level Substance Abuse History: NA  Patient / Family Perceptions, Expectations & Goals Pt/Family understanding of illness & functional limitations: Pt can explain her health issues and reason for being hospitalized she is doing much better and progresng which is encouraging to her.  She does talk with the MD's involved and feels aware of her plan moving forward. Premorbid pt/family roles/activities: mom, sibling, freind, etc Anticipated changes in  roles/activities/participation: resume Pt/family expectations/goals: Pt states: " i hope to get back to doing for myself before I leave here."  [employer]: None Premorbid Home Care/DME Agencies: None Transportation available at discharge: children Is the patient able to respond to transportation needs?: Yes In the past 12 months, has lack of transportation kept you from medical appointments or from getting medications?: No In the past 12 months, has lack of transportation kept you from meetings, work, or from getting things needed for daily living?: No  Discharge Planning Living Arrangements: Children, Non-relatives/Friends Support Systems: Children, Other relatives, Friends/neighbors Type of Residence: Private residence Insurance Resources: Media planner (specify) Radiographer, therapeutic) Financial Resources: Restaurant manager, fast food Screen Referred: No Living Expenses: Rent Money Management: Patient Does the patient have any problems obtaining your medications?: No Home Management: self Patient/Family Preliminary Plans: Return home with children and 84 yo daughter assisting if needed. Daughter provides transport since pt does not drive. All kids whom live wiht pt go to school during the day. Pt needs to be mod/i to be able to stay home alone Care Coordinator Barriers to Discharge: Decreased caregiver support, Insurance for SNF coverage Care Coordinator Anticipated Follow Up Needs: HH/OP  Clinical Impression Pleasant female who is motivated and hopes to get stronger and only be here a short time. Her children are involved and supportive. Await team's evaluations and work on any discharge needs.   [name] 05/24/2023, 10:41 AM

## 2023-05-24 NOTE — Progress Notes (Signed)
 Inpatient Rehabilitation Center Individual Statement of Services  Patient Name:  Melinda Stone  Date:  05/24/2023  Welcome to the Inpatient Rehabilitation Center.  Our goal is to provide you with an individualized program based on your diagnosis and situation, designed to meet your specific needs.  With this comprehensive rehabilitation program, you will be expected to participate in at least 3 hours of rehabilitation therapies Monday-Friday, with modified therapy programming on the weekends.  Your rehabilitation program will include the following services:  Physical Therapy (PT), Occupational Therapy (OT), Speech Therapy (ST), 24 hour per day rehabilitation nursing, Therapeutic Recreaction (TR), Neuropsychology, Care Coordinator, Rehabilitation Medicine, Nutrition Services, and Pharmacy Services  Weekly team conferences will be held on Wednesday to discuss your progress.  Your Inpatient Rehabilitation Care Coordinator will talk with you frequently to get your input and to update you on team discussions.  Team conferences with you and your family in attendance may also be held.  Expected length of stay: 7-9 days  Overall anticipated outcome: mod/I level  Depending on your progress and recovery, your program may change. Your Inpatient Rehabilitation Care Coordinator will coordinate services and will keep you informed of any changes. Your Inpatient Rehabilitation Care Coordinator's name and contact numbers are listed  below.  The following services may also be recommended but are not provided by the Inpatient Rehabilitation Center:   Home Health Rehabiltiation Services Outpatient Rehabilitation Services    Arrangements will be made to provide these services after discharge if needed.  Arrangements include referral to agencies that provide these services.  Your insurance has been verified to be:  [employer] Your primary doctor is:  Anselm Jungling  Pertinent information will be  shared with your doctor and your insurance company.  Inpatient Rehabilitation Care Coordinator:  Dossie Der, [place] [phone] or Luna Glasgow  Information discussed with and copy given to patient by: [name], 05/24/2023, 10:43 AM

## 2023-05-24 NOTE — Plan of Care (Signed)
  Problem: RH Balance Goal: LTG Patient will maintain dynamic standing with ADLs (OT) Description: LTG:  Patient will maintain dynamic standing balance with assist during activities of daily living (OT)  Flowsheets (Taken 05/24/2023 0917) LTG: Pt will maintain dynamic standing balance during ADLs with: Supervision/Verbal cueing   Problem: RH Grooming Goal: LTG Patient will perform grooming w/assist,cues/equip (OT) Description: LTG: Patient will perform grooming with assist, with/without cues using equipment (OT) Flowsheets (Taken 05/24/2023 0917) LTG: Pt will perform grooming with assistance level of: Independent   Problem: RH Bathing Goal: LTG Patient will bathe all body parts with assist levels (OT) Description: LTG: Patient will bathe all body parts with assist levels (OT) Flowsheets (Taken 05/24/2023 0917) LTG: Pt will perform bathing with assistance level/cueing: Independent with assistive device  LTG: Position pt will perform bathing: Shower   Problem: RH Dressing Goal: LTG Patient will perform upper body dressing (OT) Description: LTG Patient will perform upper body dressing with assist, with/without cues (OT). Flowsheets (Taken 05/24/2023 0917) LTG: Pt will perform upper body dressing with assistance level of: Independent Goal: LTG Patient will perform lower body dressing w/assist (OT) Description: LTG: Patient will perform lower body dressing with assist, with/without cues in positioning using equipment (OT) Flowsheets (Taken 05/24/2023 0917) LTG: Pt will perform lower body dressing with assistance level of: Independent with assistive device   Problem: RH Toileting Goal: LTG Patient will perform toileting task (3/3 steps) with assistance level (OT) Description: LTG: Patient will perform toileting task (3/3 steps) with assistance level (OT)  Flowsheets (Taken 05/24/2023 0917) LTG: Pt will perform toileting task (3/3 steps) with assistance level: Independent with assistive device    Problem: RH Simple Meal Prep Goal: LTG Patient will perform simple meal prep w/assist (OT) Description: LTG: Patient will perform simple meal prep with assistance, with/without cues (OT). Flowsheets (Taken 05/24/2023 0917) LTG: Pt will perform simple meal prep with assistance level of: Supervision/Verbal cueing LTG: Pt will perform simple meal prep w/level of: Ambulate with device   Problem: RH Toilet Transfers Goal: LTG Patient will perform toilet transfers w/assist (OT) Description: LTG: Patient will perform toilet transfers with assist, with/without cues using equipment (OT) Flowsheets (Taken 05/24/2023 0917) LTG: Pt will perform toilet transfers with assistance level of: Independent with assistive device   Problem: RH Tub/Shower Transfers Goal: LTG Patient will perform tub/shower transfers w/assist (OT) Description: LTG: Patient will perform tub/shower transfers with assist, with/without cues using equipment (OT) Flowsheets (Taken 05/24/2023 0917) LTG: Pt will perform tub/shower stall transfers with assistance level of: Supervision/Verbal cueing

## 2023-05-24 NOTE — Progress Notes (Signed)
 Inpatient Rehabilitation  Patient information reviewed and entered into eRehab system by [facility]. Karen Kays., CCC/SLP, PPS Coordinator.  Information including medical coding, functional ability and quality indicators will be reviewed and updated through discharge.

## 2023-05-24 NOTE — Evaluation (Signed)
 Occupational Therapy Assessment and Plan  Patient Details  Name: Melinda Stone MRN: [id number] Date of Birth: Aug 19, 1980  OT Diagnosis: muscle weakness (generalized) Rehab Potential: Rehab Potential (ACUTE ONLY): Good ELOS: 5-10 days   Today's Date: 05/24/2023 OT Individual Time: 4098-1191 OT Individual Time Calculation (min): 56 min     Hospital Problem: Principal Problem:   Debility Active Problems:   Respiratory failure (HCC)   Past Medical History:  Past Medical History:  Diagnosis Date   Anemia    Arthritis 08/17/2020   Chronic back pain    Diabetes mellitus without complication (HCC)    type 2   GERD (gastroesophageal reflux disease)    Migraine    Ovarian cyst    Pneumonia    Polyneuropathy    Past Surgical History:  Past Surgical History:  Procedure Laterality Date   CESAREAN SECTION     CHOLECYSTECTOMY  2004   LUMBAR LAMINECTOMY N/A 05/30/2020   Procedure: Lumbar five Laminectomy,  Bilateral Microdiscectomy, Left Lumbar five -Sacral one Microdiscectomy;  Surgeon: [name], MD;  Location: [facility] OR;  Service: Orthopedics;  Laterality: N/A;   LUMBAR LAMINECTOMY Left 11/14/2020   Procedure: LEFT LUMBAR FOUR-FIVE MICRODISCECTOMY;  Surgeon: [name], MD;  Location: [facility] OR;  Service: Orthopedics;  Laterality: Left;   TUBAL LIGATION  10/19/2010    Assessment & Plan Clinical Impression: Patient is a 43 y.o. year old female with recent admission to the hospital on 05/11/2023 with generalized weakness, abdominal pain, orthopnea and BLE swelling. She continued to have downward trends in H&H and was transfused a total of 4 units PRBCs over the course of her admission. Her respiratory status deteriorated and pulmonary medicine was consulted. Her influenza A was positive. She was intubated on 2/23. She was started on Tamiflu and completed a 7-day course. She was extubated on 2/28 and required BiPAP for 24 hours.  Patient transferred to CIR on 05/23/2023 .    Patient  currently requires min with basic self-care skills secondary to muscle weakness and decreased cardiorespiratoy endurance.  Prior to hospitalization, patient could complete BADL's without frequent rest breaks.  Patient will benefit from skilled intervention to increase independence with basic self-care skills and increase level of independence with iADL prior to discharge home with care partner.  Anticipate patient will require intermittent supervision and follow up home health.  OT - End of Session Activity Tolerance: Improving Endurance Deficit: Yes OT Assessment Rehab Potential (ACUTE ONLY): Good OT Patient demonstrates impairments in the following area(s): Balance;Pain;Endurance OT Basic ADL's Functional Problem(s): Bathing;Dressing;Toileting OT Advanced ADL's Functional Problem(s): Simple Meal Preparation OT Transfers Functional Problem(s): Toilet;Tub/Shower OT Plan OT Intensity: Minimum of 1-2 x/day, 45 to 90 minutes OT Frequency: 5 out of 7 days OT Duration/Estimated Length of Stay: 5-10 days OT Treatment/Interventions: Balance/vestibular training;Discharge planning;Pain management;Self Care/advanced ADL retraining;Therapeutic Activities;Disease mangement/prevention;Community reintegration;DME/adaptive equipment instruction;UE/LE Strength taining/ROM;Therapeutic Exercise;Patient/family education;Functional mobility training OT Self Feeding Anticipated Outcome(s): Independent OT Basic Self-Care Anticipated Outcome(s): Modified Independent OT Toileting Anticipated Outcome(s): Modified Independent OT Bathroom Transfers Anticipated Outcome(s): Modified Independent OT Recommendation Patient destination: Home Follow Up Recommendations: Other (comment) Equipment Recommended: Tub/shower bench;To be determined   OT Evaluation Precautions/Restrictions  Precautions Precautions: Fall Recall of Precautions/Restrictions: Intact Restrictions Weight Bearing Restrictions Per Provider Order:  No General Chart Reviewed: Yes Family/Caregiver Present: No Vital Signs Therapy Vitals Pulse Rate: 93 Resp: 17 Patient Position (if appropriate): Lying Oxygen Therapy SpO2: 98 % O2 Device: Room Air Pain Pain Assessment Pain Scale: 0-10 Pain Score: 7  Pain Type: Chronic pain Pain Location: Back Pain Orientation: Lower Pain Descriptors / Indicators: Aching Pain Onset: On-going Patients Stated Pain Goal: 4 Pain Intervention(s): Medication (See eMAR) Multiple Pain Sites: No Critical Care Pain Observation Tool (CPOT) Facial Expression: Relaxed, neutral Body Movements: Absence of movements Muscle Tension: Relaxed Home Living/Prior Functioning Home Living Family/patient expects to be discharged to:: Private residence Living Arrangements: Children, Non-relatives/Friends Available Help at Discharge: Family Type of Home: Apartment Home Access: Level entry Home Layout: One level Bathroom Shower/Tub: Associate Professor: Yes  Lives With: Family IADL History Homemaking Responsibilities: Yes Meal Prep Responsibility: (P) Secondary Laundry Responsibility: (P) Secondary Cleaning Responsibility: (P) Secondary Homemaking Comments: (P) Has older children in the home that assist with homemaking responsibilities Prior Function Level of Independence: (P) Independent with basic ADLs, Independent with transfers  Able to Take Stairs?: Yes Vocation: On disability Vision Baseline Vision/History: 0 No visual deficits Ability to See in Adequate Light: 0 Adequate Patient Visual Report: No change from baseline Vision Assessment?: No apparent visual deficits Perception  Perception: Within Functional Limits Praxis Praxis: WFL Cognition Cognition Overall Cognitive Status: Within Functional Limits for tasks assessed Arousal/Alertness: Awake/alert Orientation Level: Person;Place;Situation Memory: Appears intact Attention:  Selective;Sustained;Alternating Sustained Attention: Appears intact Selective Attention: Appears intact Awareness: Appears intact Problem Solving: Appears intact Executive Function: Decision Making;Reasoning;Sequencing;Organizing Reasoning: Appears intact Sequencing: Appears intact Organizing: Appears intact Decision Making: Appears intact Safety/Judgment: Appears intact Brief Interview for Mental Status (BIMS) Repetition of Three Words (First Attempt): 3 Temporal Orientation: Year: Correct Temporal Orientation: Month: Accurate within 5 days Temporal Orientation: Day: Correct Recall: "Sock": Yes, no cue required Recall: "Blue": Yes, no cue required Recall: "Bed": Yes, no cue required BIMS Summary Score: 15 Sensation Sensation Light Touch: Appears Intact Hot/Cold: Appears Intact Proprioception: Appears Intact Stereognosis: Appears Intact Coordination Gross Motor Movements are Fluid and Coordinated: No Fine Motor Movements are Fluid and Coordinated: Yes Coordination and Movement Description: Patient with modified movement patterns due to prior low back injury/surgery Motor  Motor Motor: Within Functional Limits Motor - Skilled Clinical Observations: LLE stiffness evedent during functional transfers and self care.  Trunk/Postural Assessment  Cervical Assessment Cervical Assessment: Within Functional Limits Thoracic Assessment Thoracic Assessment: Within Functional Limits Lumbar Assessment Lumbar Assessment: Exceptions to [facility] Postural Control Postural Control: Within Functional Limits  Balance Balance Balance Assessed: Yes Dynamic Standing Balance Dynamic Standing - Balance Support: No upper extremity supported Dynamic Standing - Level of Assistance: 4: Min assist Dynamic Standing - Balance Activities: Forward lean/weight shifting;Lateral lean/weight shifting;Reaching for objects Extremity/Trunk Assessment RUE Assessment RUE Assessment: Within Functional Limits LUE  Assessment LUE Assessment: Within Functional Limits  Care Tool Care Tool Self Care Eating   Eating Assist Level: Independent    Oral Care    Oral Care Assist Level: Set up assist    Bathing   Body parts bathed by patient: Right arm;Left arm;Chest;Abdomen;Front perineal area;Buttocks;Right upper leg;Left upper leg;Right lower leg;Left lower leg;Face     Assist Level: Contact Guard/Touching assist    Upper Body Dressing(including orthotics)   What is the patient wearing?: Hospital gown only   Assist Level: Set up assist    Lower Body Dressing (excluding footwear)   What is the patient wearing?: Underwear/pull up;Pants Assist for lower body dressing: Contact Guard/Touching assist    Putting on/Taking off footwear   What is the patient wearing?: Non-skid slipper socks Assist for footwear: Set up assist       Care Tool Toileting Toileting activity   Assist  for toileting: Contact Guard/Touching assist     Care Tool Bed Mobility Roll left and right activity   Roll left and right assist level: Supervision/Verbal cueing    Sit to lying activity   Sit to lying assist level: Supervision/Verbal cueing    Lying to sitting on side of bed activity   Lying to sitting on side of bed assist level: the ability to move from lying on the back to sitting on the side of the bed with no back support.: Supervision/Verbal cueing     Care Tool Transfers Sit to stand transfer   Sit to stand assist level: Contact Guard/Touching assist    Chair/bed transfer   Chair/bed transfer assist level: Contact Guard/Touching assist     Toilet transfer   Assist Level: Contact Guard/Touching assist     Care Tool Cognition  Expression of Ideas and Wants Expression of Ideas and Wants: 4. Without difficulty (complex and basic) - expresses complex messages without difficulty and with speech that is clear and easy to understand  Understanding Verbal and Non-Verbal Content Understanding Verbal and  Non-Verbal Content: 4. Understands (complex and basic) - clear comprehension without cues or repetitions   Memory/Recall Ability Memory/Recall Ability : Current season;Location of own room;Staff names and faces;That he or she is in a hospital/hospital unit   Refer to Care Plan for Long Term Goals  SHORT TERM GOAL WEEK 1 OT Short Term Goal 1 (Week 1): Patient to perform shower with SBA OT Short Term Goal 2 (Week 1): Patient to perform grooming standing at sink x 4 minutes with SBA OT Short Term Goal 3 (Week 1): Patient to perform tub bench transfer with SBA  Recommendations for other services: None    Skilled Therapeutic Intervention ADL ADL Eating: Modified independent Where Assessed-Eating: Wheelchair Grooming: Setup Where Assessed-Grooming: Sitting at sink Upper Body Bathing: Setup Where Assessed-Upper Body Bathing: Sitting at sink Lower Body Bathing: Setup Where Assessed-Lower Body Bathing: Sitting at sink Upper Body Dressing: Setup Where Assessed-Upper Body Dressing: Edge of bed Lower Body Dressing: Contact guard Where Assessed-Lower Body Dressing: Edge of bed Toileting: Supervision/safety Where Assessed-Toileting: Teacher, adult education: Furniture conservator/restorer Method: Proofreader: Engineer, technical sales: International aid/development worker Method: Ship broker: Insurance underwriter: Administrator, arts Method: Designer, industrial/product: Press photographer  Bed Mobility Bed Mobility: Rolling Right;Supine to [employer] Right: Supervision/verbal cueing Supine to Sit: Supervision/Verbal cueing Transfers Sit to Stand: Contact Guard/Touching assist   Discharge Criteria: Patient will be discharged from OT if patient refuses treatment 3 consecutive times without medical reason, if treatment goals not met, if there is a change in medical  status, if patient makes no progress towards goals or if patient is discharged from hospital.  The above assessment, treatment plan, treatment alternatives and goals were discussed and mutually agreed upon: by patient  Warnell Forester 05/24/2023, 12:38 PM

## 2023-05-24 NOTE — Evaluation (Signed)
 Physical Therapy Assessment and Plan  Patient Details  Name: SHYANNE MCCLARY MRN: [id number] Date of Birth: [birth year]  PT Diagnosis: Abnormal posture, Abnormality of gait, Difficulty walking, and Muscle weakness Rehab Potential: Excellent ELOS: 5-7 days   Today's Date: 05/24/2023 PT Individual Time: 4098-1191 PT Individual Time Calculation (min): 72 min    Hospital Problem: Principal Problem:   Debility Active Problems:   Respiratory failure (HCC)   Past Medical History:  Past Medical History:  Diagnosis Date   Anemia    Arthritis 08/17/2020   Chronic back pain    Diabetes mellitus without complication (HCC)    type 2   GERD (gastroesophageal reflux disease)    Migraine    Ovarian cyst    Pneumonia    Polyneuropathy    Past Surgical History:  Past Surgical History:  Procedure Laterality Date   CESAREAN SECTION     CHOLECYSTECTOMY  2004   LUMBAR LAMINECTOMY N/A 05/30/2020   Procedure: Lumbar five Laminectomy,  Bilateral Microdiscectomy, Left Lumbar five -Sacral one Microdiscectomy;  Surgeon: [name], MD;  Location: [facility] OR;  Service: Orthopedics;  Laterality: N/A;   LUMBAR LAMINECTOMY Left 11/14/2020   Procedure: LEFT LUMBAR FOUR-FIVE MICRODISCECTOMY;  Surgeon: [name], MD;  Location: [facility] OR;  Service: Orthopedics;  Laterality: Left;   TUBAL LIGATION  10/19/2010    Assessment & Plan Clinical Impression: Patient is a 43 year old female who presented to [facility] ED on 05/11/2023 with generalized weakness, abdominal pain, orthopnea and BLE swelling. Her past medical history significant for asthma, iron deficiency anemia due to menorrhagia, diabetes mellitus, polyneuropathy, tobacco use, arthritis. Laboratory workup revealed serum potassium of 5.5. BUN was 17 and creatinine 1.04 on admission. Her hemoglobin was 6.9 and repeat value was 6.4 and 6.2. Review of laboratory results dated 04/22/2023 revealed a normal iron/anemia profile. She continued to have downward trends  in H&H and was transfused a total of 4 units PRBCs over the course of her admission. Her respiratory status deteriorated and pulmonary medicine was consulted. She reported having sick contact and her son who was diagnosed with influenza. Her influenza A was positive and she was started on ceftriaxone, azithromycin and Solu-Medrol. She was placed on BiPAP, however she remained tachypneic and was transferred to the ICU. Repeat chest x-ray revealed worsening bilateral pulmonary opacities. Consented to CVC and A-line for ARDS management. She was intubated on 2/23. She was started on Tamiflu and completed a 7-day course. She was extubated on 2/28 and required BiPAP for 24 hours. She was treated with nebs and inhaled therapy for asthma. She was agitated and restless after extubation, treated for ICU delirium. She is on droplet precautions. Core track was placed for dysphagia and tube feedings and removed on 3/04. She is tolerating oral intake. Followed by SLP for dysphagia, upgraded to thin liquids 3/06. Other workup includes CTA of the chest negative for PE but positive for multifocal pneumonia, MRI brain negative, quadrant ultrasound with chronic hepatomegaly otherwise unremarkable. 2D echo with EF 65 to 70%. CT of the head without acute findings. Respiratory virus panel, sputum culture, blood culture and tracheal aspirate cultures all negative. PT OT and speech evaluations carried out. She is progressing towards goals and is able to ambulate hallway distance with close chair following rolling walker.The patient requires inpatient medicine and rehabilitation evaluations and services for ongoing dysfunction secondary to respiratory failure.   Patient transferred to CIR on 05/23/2023 .   Patient currently requires supervision with mobility secondary to muscle  weakness, decreased cardiorespiratoy endurance, and decreased standing balance and decreased balance strategies.  Prior to hospitalization, patient was independent   with mobility and lived with Family in a Apartment home.  Home access is  Level entry.  Patient will benefit from skilled PT intervention to maximize safe functional mobility, minimize fall risk, and decrease caregiver burden for planned discharge home with intermittent assist.  Anticipate patient will benefit from follow up OP at discharge.  PT - End of Session Activity Tolerance: Tolerates < 10 min activity, no significant change in vital signs Endurance Deficit: Yes PT Assessment Rehab Potential (ACUTE/IP ONLY): Excellent PT Barriers to Discharge: Decreased caregiver support;Lack of/limited family support PT Patient demonstrates impairments in the following area(s): Balance;Endurance;Motor;Safety;Pain PT Transfers Functional Problem(s): Bed Mobility;Bed to Chair;Car PT Locomotion Functional Problem(s): Ambulation;Stairs PT Plan PT Intensity: Minimum of 1-2 x/day ,45 to 90 minutes PT Frequency: 5 out of 7 days PT Duration Estimated Length of Stay: 5-7 days PT Treatment/Interventions: Ambulation/gait training;Cognitive remediation/compensation;Discharge planning;DME/adaptive equipment instruction;Functional mobility training;Pain management;Psychosocial support;Therapeutic Activities;UE/LE Strength taining/ROM;UE/LE Coordination activities;Wheelchair propulsion/positioning;Stair training;Therapeutic Exercise;Skin care/wound management;Patient/family education;Neuromuscular re-education;Functional electrical stimulation;Community reintegration;Balance/vestibular training;Disease management/prevention PT Transfers Anticipated Outcome(s): mod I PT Locomotion Anticipated Outcome(s): mod I PT Recommendation Recommendations for Other Services: Neuropsych consult Follow Up Recommendations: Outpatient PT;24 hour supervision/assistance Patient destination: Home Equipment Recommended: To be determined   PT Evaluation Precautions/Restrictions Precautions Precautions: Fall Recall of  Precautions/Restrictions: Intact Restrictions Weight Bearing Restrictions Per Provider Order: No Pain Pain Assessment Pain Scale: 0-10 Pain Score: 7  Pain Type: Chronic pain Pain Location: Back Pain Orientation: Lower Pain Descriptors / Indicators: Aching Pain Onset: On-going Patients Stated Pain Goal: 4 Pain Intervention(s): Medication (See eMAR) Multiple Pain Sites: No Critical Care Pain Observation Tool (CPOT) Facial Expression: Relaxed, neutral Body Movements: Absence of movements Muscle Tension: Relaxed Pain Interference Pain Interference Pain Effect on Sleep: 1. Rarely or not at all Pain Interference with Therapy Activities: 1. Rarely or not at all Pain Interference with Day-to-Day Activities: 2. Occasionally Home Living/Prior Functioning Home Living Living Arrangements: Children;Non-relatives/Friends Available Help at Discharge: Family Type of Home: Apartment Home Access: Level entry Home Layout: One level Bathroom Shower/Tub: Engineer, manufacturing systems: Standard Bathroom Accessibility: Yes  Lives With: Family Prior Function Level of Independence: Independent with transfers;Independent with gait;Independent with basic ADLs;Independent with homemaking with ambulation  Able to Take Stairs?: Yes Driving: No Vocation: On disability Vision/Perception  Vision - History Ability to See in Adequate Light: 0 Adequate Perception Perception: Within Functional Limits Praxis Praxis: WFL  Cognition Overall Cognitive Status: Within Functional Limits for tasks assessed Arousal/Alertness: Awake/alert Orientation Level: Oriented X4 Year: 2025 Month: March Day of Week: Correct Attention: Selective;Sustained;Alternating Sustained Attention: Appears intact Selective Attention: Appears intact Alternating Attention: Appears intact Memory: Appears intact Awareness: Appears intact Problem Solving: Appears intact Executive Function: Decision  Making;Reasoning;Sequencing;Organizing Reasoning: Appears intact Sequencing: Appears intact Organizing: Appears intact Decision Making: Appears intact Behaviors: Other (comment) (flat affect) Safety/Judgment: Appears intact Sensation Sensation Light Touch: Appears Intact Hot/Cold: Appears Intact Proprioception: Appears Intact Stereognosis: Appears Intact Coordination Gross Motor Movements are Fluid and Coordinated: No Fine Motor Movements are Fluid and Coordinated: Yes Coordination and Movement Description: Generalized debility Motor  Motor Motor: Other (comment) Motor - Skilled Clinical Observations: Generalized weakness and deconditioning   Trunk/Postural Assessment  Cervical Assessment Cervical Assessment: Exceptions to [facility] (forward head) Thoracic Assessment Thoracic Assessment: Within Functional Limits Lumbar Assessment Lumbar Assessment: Within Functional Limits Postural Control Postural Control: Within Functional Limits  Balance Balance Balance Assessed: Yes Static Sitting  Balance Static Sitting - Balance Support: Feet supported;No upper extremity supported Static Sitting - Level of Assistance: 7: Independent Dynamic Sitting Balance Dynamic Sitting - Balance Support: Feet supported;No upper extremity supported Dynamic Sitting - Level of Assistance: 6: Modified independent (Device/Increase time) Static Standing Balance Static Standing - Balance Support: Bilateral upper extremity supported Static Standing - Level of Assistance: 5: Stand by assistance Dynamic Standing Balance Dynamic Standing - Balance Support: During functional activity;Bilateral upper extremity supported Dynamic Standing - Level of Assistance: 5: Stand by assistance Extremity Assessment      RLE Assessment RLE Assessment: Exceptions to [facility] General Strength Comments: Grossly 4/5 LLE Assessment LLE Assessment: Exceptions to [facility] General Strength Comments: Grossly 4/5  Care Tool Care Tool Bed  Mobility Roll left and right activity   Roll left and right assist level: Supervision/Verbal cueing    Sit to lying activity   Sit to lying assist level: Supervision/Verbal cueing    Lying to sitting on side of bed activity   Lying to sitting on side of bed assist level: the ability to move from lying on the back to sitting on the side of the bed with no back support.: Supervision/Verbal cueing     Care Tool Transfers Sit to stand transfer   Sit to stand assist level: Supervision/Verbal cueing    Chair/bed transfer   Chair/bed transfer assist level: Supervision/Verbal cueing    Car transfer   Car transfer assist level: Contact Guard/Touching assist      Care Tool Locomotion Ambulation   Assist level: Supervision/Verbal cueing Assistive device: Walker-rolling Max distance: 175'  Walk 10 feet activity   Assist level: Supervision/Verbal cueing Assistive device: Walker-rolling   Walk 50 feet with 2 turns activity   Assist level: Supervision/Verbal cueing Assistive device: Walker-rolling  Walk 150 feet activity   Assist level: Supervision/Verbal cueing Assistive device: Walker-rolling  Walk 10 feet on uneven surfaces activity   Assist level: Contact Guard/Touching assist Assistive device: Walker-rolling  Stairs   Assist level: Contact Guard/Touching assist Stairs assistive device: 2 hand rails Max number of stairs: 4  Walk up/down 1 step activity   Walk up/down 1 step (curb) assist level: Contact Guard/Touching assist Walk up/down 1 step or curb assistive device: 2 hand rails  Walk up/down 4 steps activity   Walk up/down 4 steps assist level: Contact Guard/Touching assist Walk up/down 4 steps assistive device: 2 hand rails  Walk up/down 12 steps activity Walk up/down 12 steps activity did not occur: Safety/medical concerns (fatigue)      Pick up small objects from floor   Pick up small object from the floor assist level: Contact Guard/Touching assist    Wheelchair  Is the patient using a wheelchair?: No          Wheel 50 feet with 2 turns activity      Wheel 150 feet activity        Refer to Care Plan for Long Term Goals  SHORT TERM GOAL WEEK 1 PT Short Term Goal 1 (Week 1): STG = LTG due to ELOS  Recommendations for other services: Neuropsych  Skilled Therapeutic Intervention Mobility Bed Mobility Bed Mobility: Sit to Supine;Supine to Sit Rolling Right: Supervision/verbal cueing Supine to Sit: Supervision/Verbal cueing Sit to Supine: Supervision/Verbal cueing Transfers Transfers: Sit to Stand;Transfer;Stand to Sit;Stand Pivot Transfers Sit to Stand: Contact Guard/Touching assist Stand to Sit: Contact Guard/Touching assist Stand Pivot Transfers: Contact Guard/Touching assist Transfer (Assistive device): Rolling walker Locomotion  Gait Ambulation: Yes Gait Assistance: Supervision/Verbal cueing Gait  Distance (Feet): 175 Feet Assistive device: Rolling walker Gait Assistance Details: Verbal cues for precautions/safety;Verbal cues for gait pattern;Verbal cues for technique;Verbal cues for safe use of DME/AE Gait Gait: Yes Gait Pattern: Impaired Gait Pattern: Step-through pattern;Trunk flexed;Poor foot clearance - left;Poor foot clearance - right Stairs / Additional Locomotion Stairs: Yes Wheelchair Mobility Wheelchair Mobility: No   Skilled intervention: Pt in bed to start - agreeable to PT evaluation. Pt has no complaints of acute pain - does report chronic pain in back and leg, unrated. Pt cooperative throughout treatment but does have a flat affect and can be difficult to understand at times.   Initiated functional mobility as outlined above. Overall, requires CGA to supervision with the use of a RW for functional mobility. She presents with generalized weakness and deconditioning, impaired dynamic standing balance, and limited activity tolerance.   Six minute walk test completed with the RW - pt ambulated 169ft with 3 minutes  and needed a 1.5 minute seated rest before returning to test to finish for a total of 256ft.   Timed Up and Go completed with RW and supervision assist - completed in 35.5 seconds. Scores > 15 seconds indicate increased falls risk.   Finished session with Nustep for x10 minutes at L5 resistance with BUE/BLE to challenge whole body strengthening and cardiovascular endurance. Achieved 353 steps in total.   Instructed pt in results of PT evaluation as detailed above, PT POC, rehab potential, rehab goals, and discharge recommendations. Additionally discussed CIR's policies regarding fall safety and use of chair alarm and/or quick release belt. Pt verbalized understanding and in agreement. Will update pt's family members as they become available.   Pt ended treatment sitting in wheelchair with safety measures in place. Pt requesting to take a shower - NT made aware of her request.   Discharge Criteria: Patient will be discharged from PT if patient refuses treatment 3 consecutive times without medical reason, if treatment goals not met, if there is a change in medical status, if patient makes no progress towards goals or if patient is discharged from hospital.  The above assessment, treatment plan, treatment alternatives and goals were discussed and mutually agreed upon: by patient  Orrin Brigham  PT, DPT, CSRS  05/24/2023, 1:39 PM

## 2023-05-24 NOTE — Progress Notes (Signed)
 Inpatient Rehabilitation Admission Medication Review by a Pharmacist  A complete drug regimen review was completed for this patient to identify any potential clinically significant medication issues.  High Risk Drug Classes Is patient taking? Indication by Medication  Antipsychotic No   Anticoagulant Yes Heparin SQ- VTE prophylaxis  Antibiotic No   Opioid No   Antiplatelet No   Hypoglycemics/insulin Yes Insulin aspart SSI, Lantus insulin -DM Type 2  Vasoactive Medication Yes Metoprolol- HTN  Chemotherapy No   Other Yes Amitriptyline- mood/ behavior/sleep Acetaminophen - pain Flexeril- muscle spasm Gapabentin-polyneuropathy Docusate, fleet enema, miralax  - stool softener/ laxative/  bowel regimen Ondansetron- nausea, vomiting Revefenacin, arformoterol, budesonide nebs - asthma/respiratory failure due to influenza Protonix- GERD Multivitamin, cyanocobalamin  - supplements     Type of Medication Issue Identified Description of Issue Recommendation(s)  Drug Interaction(s) (clinically significant)     Duplicate Therapy     Allergy     No Medication Administration End Date     Incorrect Dose     Additional Drug Therapy Needed     Significant med changes from prior encounter (inform family/care partners about these prior to discharge).    Other       Clinically significant medication issues were identified that warrant physician communication and completion of prescribed/recommended actions by midnight of the next day:  No  Name of provider notified for urgent issues identified:   Provider Method of Notification:    Pharmacist comments:   Time spent performing this drug regimen review (minutes):     Thank you for allowing pharmacy to be part of this patients care team.  Noah Delaine, RPh Clinical Pharmacist 05/24/2023 1:32 PM

## 2023-05-25 LAB — GLUCOSE, CAPILLARY
Glucose-Capillary: 200 mg/dL — ABNORMAL HIGH (ref 70–99)
Glucose-Capillary: 226 mg/dL — ABNORMAL HIGH (ref 70–99)
Glucose-Capillary: 261 mg/dL — ABNORMAL HIGH (ref 70–99)
Glucose-Capillary: 279 mg/dL — ABNORMAL HIGH (ref 70–99)

## 2023-05-25 LAB — HEMOGLOBIN AND HEMATOCRIT, BLOOD
HCT: 21.4 % — ABNORMAL LOW (ref 36.0–46.0)
Hemoglobin: 7.1 g/dL — ABNORMAL LOW (ref 12.0–15.0)

## 2023-05-25 MED ORDER — DOCUSATE SODIUM 100 MG PO CAPS
100.0000 mg | ORAL_CAPSULE | Freq: Every day | ORAL | Status: DC | PRN
  Filled 2023-05-25: qty 1

## 2023-05-25 NOTE — Progress Notes (Signed)
 PROGRESS NOTE   Subjective/Complaints: Pt reports too many bowels- asked to reduce meds.  Pain not too bad- mainly with PT.  Is "fine"   ROS:   Pt denies SOB, abd pain, CP, N/V/C/D, and vision changes  + Shortness of breath improving. +cough + Chronic loose bowel movement  Objective:   No results found. Recent Labs    05/24/23 1114 05/25/23 0849  WBC 10.4  --   HGB 7.2* 7.1*  HCT 21.5* 21.4*  PLT 327  --    Recent Labs    05/24/23 1114  NA 137  K 3.8  CL 106  CO2 21*  GLUCOSE 199*  BUN 26*  CREATININE 1.22*  CALCIUM 8.5*    Intake/Output Summary (Last 24 hours) at 05/25/2023 1638 Last data filed at 05/25/2023 1230 Gross per 24 hour  Intake 917 ml  Output --  Net 917 ml        Physical Exam: Vital Signs Blood pressure 139/71, pulse 88, temperature 98 F (36.7 C), resp. rate 18, height (P) 5\' 9"  (1.753 m), weight 95.1 kg, SpO2 100%.   General: awake, alert, appropriate, sitting EOB; NAD HENT: conjugate gaze; oropharynx moist CV: regular rate and rhythm; no JVD Pulmonary: CTA B/L; no W/R/R- good air movement GI: soft, NT, ND, (+)BS Psychiatric: appropriate Neurological: Ox3 . Extremities: No clubbing, cyanosis, or edema. Pulses are 2+ Psych: Pt's affect is appropriate. Pt is cooperative Skin: Clean and intact without signs of breakdown Neuro:     Mental Status: AAOx3, memory grossly intact Speech/Languate: Follows simple commands CRANIAL NERVES: 2 through 12 grossly intact   MOTOR: RUE: 4/5 Deltoid, 4/5 Biceps, 4/5 Triceps,4/5 Grip LUE: 4/5 Deltoid, 4/5 Biceps, 4/5 Triceps, 4/5 Grip RLE: HF 4/5, KE 4/5, ADF 4/5, APF 4/5 LLE: HF 4/5, KE 4/5, ADF 4/5, APF 4/5   SENSORY: Altered in distal feet- chronic  Assessment/Plan: 1. Functional deficits which require 3+ hours per day of interdisciplinary therapy in a comprehensive inpatient rehab setting. Physiatrist is providing close team  supervision and 24 hour management of active medical problems listed below. Physiatrist and rehab team continue to assess barriers to discharge/monitor patient progress toward functional and medical goals  Care Tool:  Bathing    Body parts bathed by patient: Right arm, Left arm, Chest, Abdomen, Front perineal area, Buttocks, Right upper leg, Left upper leg, Right lower leg, Left lower leg, Face         Bathing assist Assist Level: Contact Guard/Touching assist     Upper Body Dressing/Undressing Upper body dressing   What is the patient wearing?: Hospital gown only    Upper body assist Assist Level: Set up assist    Lower Body Dressing/Undressing Lower body dressing      What is the patient wearing?: Underwear/pull up, Pants     Lower body assist Assist for lower body dressing: Contact Guard/Touching assist     Toileting Toileting    Toileting assist Assist for toileting: Contact Guard/Touching assist     Transfers Chair/bed transfer  Transfers assist     Chair/bed transfer assist level: Supervision/Verbal cueing     Locomotion Ambulation   Ambulation assist  Assist level: Supervision/Verbal cueing Assistive device: Walker-rolling Max distance: 175'   Walk 10 feet activity   Assist     Assist level: Supervision/Verbal cueing Assistive device: Walker-rolling   Walk 50 feet activity   Assist    Assist level: Supervision/Verbal cueing Assistive device: Walker-rolling    Walk 150 feet activity   Assist    Assist level: Supervision/Verbal cueing Assistive device: Walker-rolling    Walk 10 feet on uneven surface  activity   Assist     Assist level: Contact Guard/Touching assist Assistive device: Walker-rolling   Wheelchair     Assist Is the patient using a wheelchair?: No             Wheelchair 50 feet with 2 turns activity    Assist            Wheelchair 150 feet activity     Assist           Blood pressure 139/71, pulse 88, temperature 98 F (36.7 C), resp. rate 18, height (P) 5\' 9"  (1.753 m), weight 95.1 kg, SpO2 100%.  Medical Problem List and Plan: 1. Functional deficits secondary to Debility due to hypoxic respiratory failure secondary to influenza pneumonia and asthma exacerbation              -patient may shower             -ELOS/Goals: 5-7 days , mod I PT/OT/SLP             Con't CIR PT and OT  -Continue Robitussin DM as needed.  Discussed Mucinex but patient reports that this upsets her stomach.  She reports she has tolerated Robitussin DM, reviewed this also has guaifenesin.   2.  Antithrombotics: -DVT/anticoagulation:  Pharmaceutical: Heparin             -antiplatelet therapy: none   3. Pain Management: Tylenol, Flexeril as needed  -Patient reports she has not needed outpatient pain medications for the past years, appears oxycodone ordered by PDMP?  -Discussed magnesium for muscle cramps, she declines due to concern of diarrhea   3/8- pain contrlled with meds 4. Mood/Behavior/Sleep: LCSW to evaluate and provide emotional support             -continue Elavil 25 mg q HS             -antipsychotic agents: n/a   5. Neuropsych/cognition: This patient is capable of making decisions on her own behalf.   6. Skin/Wound Care: Routine skin care checks             -carb modified diet             -continue MVI, B-12   7. 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: CBGs QID; A1c = 5.0%             -continue SSI             -  continue Lantus 30 units daily  -Increase  Lantus to 33u daily   3/8- her Lantus was increased yesterday- still 200s will monitor trend CBG (last 3)  Recent Labs    05/24/23 2026 05/25/23 0617 05/25/23 1140  GLUCAP 264* 200* 226*      10: Asthma/acute respiratory failure due to influenza -continue Brovana/Pulimcort/Yulperi nebs (consider conversion to inhalers) -continue incentive spirometry   11: GERD: continue Protonix   12: Polyneuropathy: continue gabapentin 400 mg q 8 hours   13: Acute on chronic anemia, hx of Fe deficiency; status post 4 units PRBCs             -recheck CBC             -anemia has been worked up>>heavy menses, requires Venofer             -follow-up Dr. Cathie Hoops  -7/3 HGB down to 7.2, similar level on 3/2.  May be more fluid status/dilutional related.  Recheck tomorrow.   3/8- Hb 7.1- will recheck in AM and if lower, will transfuse 14: Class 1 obesity             Body mass index is 31.94 kg/m.   3/8- BMI 30.96 15: Hypokalemia and prior hyerkalemia: follow-up BMP  -CMP pending   3/8- K+ 3.8 16: AKI:  likely due to ATN, 3.02 --->1.22, now rising BUN/Cr back up to 62/1.47 on  3/4             -CMP results pending   3/8- Cr 1.22 and BUN 26 17. Oropharyngeal dysphagia:             -upgraded to Carb mod/thin diet, continue SLP     I spent a total of  36  minutes on total care today- >50% coordination of care- due to  D/w nursing and PA about Hb - will order labs for AM- will decrease Colace to daily prn from BID since has chronic diarrhea.    LOS: 2 days A FACE TO FACE EVALUATION WAS PERFORMED  Daphne Karrer 05/25/2023, 4:38 PM

## 2023-05-26 ENCOUNTER — Inpatient Hospital Stay (HOSPITAL_COMMUNITY)

## 2023-05-26 LAB — BASIC METABOLIC PANEL
Anion gap: 7 (ref 5–15)
BUN: 18 mg/dL (ref 6–20)
CO2: 21 mmol/L — ABNORMAL LOW (ref 22–32)
Calcium: 8.5 mg/dL — ABNORMAL LOW (ref 8.9–10.3)
Chloride: 107 mmol/L (ref 98–111)
Creatinine, Ser: 0.93 mg/dL (ref 0.44–1.00)
GFR, Estimated: >60 mL/min (ref >60)
Glucose, Bld: 227 mg/dL — ABNORMAL HIGH (ref 70–99)
Potassium: 4 mmol/L (ref 3.5–5.1)
Sodium: 135 mmol/L (ref 135–145)

## 2023-05-26 LAB — PREPARE RBC (CROSSMATCH)

## 2023-05-26 LAB — GLUCOSE, CAPILLARY
Glucose-Capillary: 265 mg/dL — ABNORMAL HIGH (ref 70–99)
Glucose-Capillary: 273 mg/dL — ABNORMAL HIGH (ref 70–99)
Glucose-Capillary: 209 mg/dL — ABNORMAL HIGH (ref 70–99)
Glucose-Capillary: 327 mg/dL — ABNORMAL HIGH (ref 70–99)

## 2023-05-26 LAB — CBC WITH DIFFERENTIAL/PLATELET
Abs Immature Granulocytes: 0.1 10*3/uL — ABNORMAL HIGH (ref 0.00–0.07)
Basophils Absolute: 0.1 10*3/uL (ref 0.0–0.1)
Basophils Relative: 1 %
Eosinophils Absolute: 0.2 10*3/uL (ref 0.0–0.5)
Eosinophils Relative: 3 %
HCT: 19.6 % — ABNORMAL LOW (ref 36.0–46.0)
Hemoglobin: 6.5 g/dL — CL (ref 12.0–15.0)
Immature Granulocytes: 1 %
Lymphocytes Relative: 22 %
Lymphs Abs: 1.8 10*3/uL (ref 0.7–4.0)
MCH: 27.4 pg (ref 26.0–34.0)
MCHC: 33.2 g/dL (ref 30.0–36.0)
MCV: 82.7 fL (ref 80.0–100.0)
Monocytes Absolute: 0.5 10*3/uL (ref 0.1–1.0)
Monocytes Relative: 6 %
Neutro Abs: 5.4 10*3/uL (ref 1.7–7.7)
Neutrophils Relative %: 67 %
Platelets: 303 10*3/uL (ref 150–400)
RBC: 2.37 MIL/uL — ABNORMAL LOW (ref 3.87–5.11)
RDW: 19.7 % — ABNORMAL HIGH (ref 11.5–15.5)
WBC: 8 10*3/uL (ref 4.0–10.5)
nRBC: 0 % (ref 0.0–0.2)

## 2023-05-26 MED ORDER — TRAMADOL HCL 50 MG PO TABS
50.0000 mg | ORAL_TABLET | Freq: Four times a day (QID) | ORAL | Status: DC | PRN
  Administered 2023-05-26 – 2023-05-29 (×8): 50 mg via ORAL
  Filled 2023-05-26 (×8): qty 1

## 2023-05-26 MED ORDER — INSULIN GLARGINE 100 UNIT/ML ~~LOC~~ SOLN
36.0000 [IU] | Freq: Every day | SUBCUTANEOUS | Status: DC
  Administered 2023-05-27 – 2023-05-28 (×2): 36 [IU] via SUBCUTANEOUS
  Filled 2023-05-26 (×2): qty 0.36

## 2023-05-26 MED ORDER — SODIUM CHLORIDE 0.9% IV SOLUTION: Freq: Once | INTRAVENOUS | Status: AC

## 2023-05-26 MED ORDER — FLUCONAZOLE 150 MG PO TABS
150.0000 mg | ORAL_TABLET | Freq: Once | ORAL | Status: AC
  Administered 2023-05-26: 150 mg via ORAL
  Filled 2023-05-26: qty 1

## 2023-05-26 NOTE — Progress Notes (Signed)
 Occupational Therapy Session Note  Patient Details  Name: Melinda Stone MRN: [id number] Date of Birth: 12-02-1980  Today's Date: 05/26/2023 OT Individual Time: 0800-0907 OT Individual Time Calculation (min): 67 min   Short Term Goals: Week 1:  OT Short Term Goal 1 (Week 1): Patient to perform shower with SBA OT Short Term Goal 2 (Week 1): Patient to perform grooming standing at sink x 4 minutes with SBA OT Short Term Goal 3 (Week 1): Patient to perform tub bench transfer with SBA  Skilled Therapeutic Interventions/Progress Updates:  Pt greeted resting in bed, head underneath covers, aurosing easily and receptive to skilled OT session with focus on functional transfers, general conditioning, and dynamic standing balance for carryover into ADL/IADLs.   Pain: Pt reported 9/10 L-ankle pain, medicated during session. OT offering intermediate rest breaks and positioning suggestions throughout session to address pain/fatigue and maximize participation/safety in session.   Functional Transfers: Stand-pivot from EOB>WC with CGA + no AD. WC propelled from room>main therapy gym with Mod I. Ambulation from main therapy gym>room with CGA progressing to close supervision + use of WC as assistive device.   Self Care Tasks: Pt defers self care tasks this session.   Therapeutic Activities: Pt instructed in BLE coordination activity using colored/numbered visual targets on the floor. Pt tasked to tap visual target as dictated by therapist (I.e left foot, purple), targets initially placed in front semi-circle layout and progressing to full-circle layout.  Pt completes with CGA + no UE support, increase in L ankle throbbing noted, MD previous approving WBAT, rest provided as needed.   Therapeutic Exercise: Pt instructed in the exercises noted below: Punches Overhead press  Pt completes 3 x 10 reps with multimodal cuing provided for form, using 4# DB.   Pt remained sitting in WC, ice provided to  site with limb elevated on EOB, 4Ps assessed and immediate needs met. Posey pad alarmed. Pt continues to be appropriate for skilled OT intervention to promote further functional independence in ADLs/IADLs.   Therapy Documentation Precautions:  Precautions Precautions: Fall Recall of Precautions/Restrictions: Intact Restrictions Weight Bearing Restrictions Per Provider Order: No   Therapy/Group: Individual Therapy  Lou Cal, OTR/L, MSOT  05/26/2023, 6:11 AM

## 2023-05-26 NOTE — Progress Notes (Signed)
 Physical Therapy Session Note  Patient Details  Name: Melinda Stone MRN: [id number] Date of Birth: 11-13-1980  Today's Date: 05/26/2023 PT Individual Time: 5284-1324, 1345-1430 PT Individual Time Calculation (min): 69 min, 45 min   Short Term Goals: Week 1:  PT Short Term Goal 1 (Week 1): STG = LTG due to ELOS  Skilled Therapeutic Interventions/Progress Updates:      Treatment Session 1  Pt supine in bed upon arrival. Pt agreeable to therapy. Pt reports 9/10 throbbing/ache pain in L ankle. Awaiting results from x ray. Verified with MD via secure chat, pt okay to continue therapy L LE WBAT. No evidence of discoloration of L ankle upon visual inspection. Therapist attempted to pin point pain with palpation however pt unable to detect light touch 2/2 B LE neuropathy. Nurse present to administer pain medication. Therapist provided rest breaks and repositioning as needed.    Pt ambulated room to day room with no AD and CGA, verbal cues provided for narrower BOS, and reciprocal gait. Pt reports no change in pain with ambulation.   Therapist introduced rollator. Treatment Session focused on gait training, progressing pt activity tolerance/endurance.  Pt ambulated with rollator and supervision a total of 640 feet with 2 prolonged seated rest breaks for pain and fatigue. Pt ambulated 320 feet prior to requiring seated rest break. Pt demos improved reciprocal pattern with rollator. Of note: pt demo significant R LE eversion, unable to self correct in standing. Plan to investigate further next session.   Therapist adjusted height of rollator, and provided demonstration and verbal cues for tecnique for sit to stand, stand ot sit for safety, pt demos improved carry over throughout session.   Therapist recommended pt family bring pt a pair of tennis shoes. Education provided on importance of wearing tennis shoes with mobility/locomotion 2/2 decreased sensation B and diabetes diagnosis to reduce  risk of pressure injury/infection. Therapist introduced skin inspection mirror and provided education on importance of inspecting skin daily (medial, lateral, anterior, posterior, and between the toes). Pt verbalized understanding.   Pt supine in bed at end of session with all needs within reach and bed alarm on.   Treatment Session 2  Pt seated EOB upon arrival. Pt agreeable to therapy. Pt denies any pain. Pt frustrated as she has not yet received lunch. Pt lunch delivered during session.  Pt missed 15 minutes 2/2 eating lunch.    Pt requesting to use bathroom. Pt ambulated with no AD and supervision to bathroom and performed ambulatory transfer to bathroom with supervision. Pt continent of bladder, pt performed pericare with mod I.   Pt ambulatated room to rehab apartment, rehab apartment to room and performed ambulatory transfer to recliner and couch with supervision, verbal cues provided for UE positioning. Pt demos carry over of safety with rollator.   Assessed pt ankle ROM while seated. Pt able invert to neutral and place foot flat on floor. Pt dorsiflex limited resistance from gastroc tightness.  Pt performed standing gastroc stretch x3 holding for 1 min. Pt demos decreased tightness and improved ROM. Pt better able to correct RLE eversion during gait with verbal cues but reverts to R LE eversion without cues.   Nurse notified therapist pt needs blood transfusion 2/2 low hemoglobin. Remainder of session performed at EOB. Pt performed 1x20 ankle inversion with green TB, and 2x20 heel/toe raises.   Pt supine in bed at end of session with all needs within reach and bed alarm on.       Therapy  Documentation Precautions:  Precautions Precautions: Fall Recall of Precautions/Restrictions: Intact Restrictions Weight Bearing Restrictions Per Provider Order: No  Therapy/Group: Individual Therapy  [facility], , DPT  05/26/2023, 7:59 AM

## 2023-05-26 NOTE — Progress Notes (Signed)
 PROGRESS NOTE   Subjective/Complaints:  Pt reports L ankle really hurting-  said feels swollen and very painful- no other area hurts like this.  Couldn't sleep due to pain.  No trauma or therapy yesterday.  Cannot feel L ankle, but knows it's painful.  Throbbing pain.   Also feels like has vaginal yeast infection.   ROS:   Pt denies SOB, abd pain, CP, N/V/C/D, and vision changes  + Shortness of breath improving. +cough + Chronic loose bowel movement  Objective:   DG Ankle Complete Left Result Date: 05/26/2023 CLINICAL DATA:  [id number] Acute left ankle pain [id number] EXAM: LEFT ANKLE COMPLETE - 3+ VIEW COMPARISON:  March 25, 2023 FINDINGS: Osteopenia. No acute fracture or dislocation. Joint spaces and alignment are maintained. No area of erosion or osseous destruction. No unexpected radiopaque foreign body. Soft tissues are unremarkable. IMPRESSION: No acute fracture or dislocation. Electronically Signed   By: Meda Klinefelter M.D.   On: 05/26/2023 12:32   Recent Labs    05/24/23 1114 05/25/23 0849 05/26/23 1329  WBC 10.4  --  8.0  HGB 7.2* 7.1* 6.5*  HCT 21.5* 21.4* 19.6*  PLT 327  --  303   Recent Labs    05/24/23 1114 05/26/23 1329  NA 137 135  K 3.8 4.0  CL 106 107  CO2 21* 21*  GLUCOSE 199* 227*  BUN 26* 18  CREATININE 1.22* 0.93  CALCIUM 8.5* 8.5*    Intake/Output Summary (Last 24 hours) at 05/26/2023 1636 Last data filed at 05/26/2023 0841 Gross per 24 hour  Intake 956 ml  Output --  Net 956 ml        Physical Exam: Vital Signs Blood pressure (!) 144/82, pulse 84, temperature 98.2 F (36.8 C), temperature source Oral, resp. rate 16, height (P) 5\' 9"  (1.753 m), weight 95.1 kg, SpO2 100%.    General: awake, alert, appropriate, supine in bed; covers over her head; also seen in hallway; NAD HENT: conjugate gaze; oropharynx moist CV: regular rate and rhythm; no JVD Pulmonary: CTA B/L; no W/R/R-  good air movement GI: soft, NT, ND, (+)BS Psychiatric: appropriate Neurological: Ox3 GU didn't assess vaginal yeast Extremities: No clubbing, cyanosis, or edema. Pulses are 2+ Psych: Pt's affect is appropriate. Pt is cooperative Skin: Clean and intact without signs of breakdown Neuro:     Mental Status: AAOx3, memory grossly intact Speech/Languate: Follows simple commands CRANIAL NERVES: 2 through 12 grossly intact   MOTOR: RUE: 4/5 Deltoid, 4/5 Biceps, 4/5 Triceps,4/5 Grip LUE: 4/5 Deltoid, 4/5 Biceps, 4/5 Triceps, 4/5 Grip RLE: HF 4/5, KE 4/5, ADF 4/5, APF 4/5 LLE: HF 4/5, KE 4/5, ADF 4/5, APF 4/5   SENSORY: Altered in distal feet- chronic  Assessment/Plan: 1. Functional deficits which require 3+ hours per day of interdisciplinary therapy in a comprehensive inpatient rehab setting. Physiatrist is providing close team supervision and 24 hour management of active medical problems listed below. Physiatrist and rehab team continue to assess barriers to discharge/monitor patient progress toward functional and medical goals  Care Tool:  Bathing    Body parts bathed by patient: Right arm, Left arm, Chest, Abdomen, Front perineal area, Buttocks, Right upper leg, Left upper  leg, Right lower leg, Left lower leg, Face         Bathing assist Assist Level: Contact Guard/Touching assist     Upper Body Dressing/Undressing Upper body dressing   What is the patient wearing?: Hospital gown only    Upper body assist Assist Level: Set up assist    Lower Body Dressing/Undressing Lower body dressing      What is the patient wearing?: Underwear/pull up, Pants     Lower body assist Assist for lower body dressing: Contact Guard/Touching assist     Toileting Toileting    Toileting assist Assist for toileting: Contact Guard/Touching assist     Transfers Chair/bed transfer  Transfers assist     Chair/bed transfer assist level: Supervision/Verbal cueing      Locomotion Ambulation   Ambulation assist      Assist level: Supervision/Verbal cueing Assistive device: Walker-rolling Max distance: 175'   Walk 10 feet activity   Assist     Assist level: Supervision/Verbal cueing Assistive device: Walker-rolling   Walk 50 feet activity   Assist    Assist level: Supervision/Verbal cueing Assistive device: Walker-rolling    Walk 150 feet activity   Assist    Assist level: Supervision/Verbal cueing Assistive device: Walker-rolling    Walk 10 feet on uneven surface  activity   Assist     Assist level: Contact Guard/Touching assist Assistive device: Walker-rolling   Wheelchair     Assist Is the patient using a wheelchair?: No             Wheelchair 50 feet with 2 turns activity    Assist            Wheelchair 150 feet activity     Assist          Blood pressure (!) 144/82, pulse 84, temperature 98.2 F (36.8 C), temperature source Oral, resp. rate 16, height (P) 5\' 9"  (1.753 m), weight 95.1 kg, SpO2 100%.  Medical Problem List and Plan: 1. Functional deficits secondary to Debility due to hypoxic respiratory failure secondary to influenza pneumonia and asthma exacerbation              -patient may shower             -ELOS/Goals: 5-7 days , mod I PT/OT/SLP             Ccon't CIR PT and OT  -Continue Robitussin DM as needed.  Discussed Mucinex but patient reports that this upsets her stomach.  She reports she has tolerated Robitussin DM, reviewed this also has guaifenesin.   2.  Antithrombotics: -DVT/anticoagulation:  Pharmaceutical: Heparin  3/9- held Heparin today due to bleeding/drop in Hb             -antiplatelet therapy: none   3. Pain Management: Tylenol, Flexeril as needed  -Patient reports she has not needed outpatient pain medications for the past years, appears oxycodone ordered by PDMP?  -Discussed magnesium for muscle cramps, she declines due to concern of diarrhea   3/8-  pain contrlled with meds  3/9- ordered Tramadol prn for L ankle pain 4. Mood/Behavior/Sleep: LCSW to evaluate and provide emotional support             -continue Elavil 25 mg q HS             -antipsychotic agents: n/a   5. Neuropsych/cognition: This patient is capable of making decisions on her own behalf.   6. Skin/Wound Care: Routine skin care checks             -  carb modified diet             -continue MVI, B-12   7. 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: CBGs QID; A1c = 5.0%             -continue SSI             -continue Lantus 30 units daily  -Increase Lantus to 33u daily   3/8- her Lantus was increased yesterday- still 200s will monitor trend  3/9- will increase lantus to 36 units daily- will start in AM CBG (last 3)  Recent Labs    05/25/23 2045 05/26/23 0604 05/26/23 1146  GLUCAP 279* 265* 273*      10: Asthma/acute respiratory failure due to influenza -continue Brovana/Pulimcort/Yulperi nebs (consider conversion to inhalers) -continue incentive spirometry   11: GERD: continue Protonix   12: Polyneuropathy: continue gabapentin 400 mg q 8 hours   13: Acute on chronic anemia, hx of Fe deficiency; status post 4 units PRBCs             -recheck CBC             -anemia has been worked up>>heavy menses, requires Venofer             -follow-up Dr. Cathie Hoops  -7/3 HGB down to 7.2, similar level on 3/2.  May be more fluid status/dilutional related.  Recheck tomorrow.   3/8- Hb 7.1- will recheck in AM  and if lower, will transfuse  3/9- Hb 6/5- will give 2 units pRBCs 14: Class 1 obesity             Body mass index is 31.94 kg/m.   3/8- BMI 30.96 15: Hypokalemia and prior hyerkalemia: follow-up BMP  -CMP pending   3/8- K+ 3.8 16: AKI:  likely due to ATN, 3.02 --->1.22, now rising BUN/Cr back up to 62/1.47 on  3/4             -CMP results pending   3/8- Cr 1.22 and BUN 26 17. Oropharyngeal dysphagia:             -upgraded to Carb mod/thin diet, continue SLP   18. Vaginal candidiasis  3/9- will give Diflucan 150 mg x1 19. L ankle pain- went over xray independently- looks fine- trace anterior ankle swelling, but not severe- con't regimen- started Tramadol prn   I spent a total of 57   minutes on total care today- >50% coordination of care- due to  Spoke with nursing 5x today- labs didn't come back til 2:15 pm of note.  Having to transfuse pt- change Lantus- start Diflucan- and xray and treatment. Held heparin since bleeding  LOS: 3 days A FACE TO FACE EVALUATION WAS PERFORMED  Arnola Crittendon 05/26/2023, 4:36 PM

## 2023-05-26 NOTE — Progress Notes (Signed)
 Occupational Therapy Session Note  Patient Details  Name: Melinda Stone MRN: [id number] Date of Birth: 19-Sep-1980  Session 1: {CHL IP REHAB OT TIME CALCULATIONS:304400400} Session 2: {CHL IP REHAB OT TIME CALCULATIONS:304400400}  Short Term Goals: Week 1:  OT Short Term Goal 1 (Week 1): Patient to perform shower with SBA OT Short Term Goal 2 (Week 1): Patient to perform grooming standing at sink x 4 minutes with SBA OT Short Term Goal 3 (Week 1): Patient to perform tub bench transfer with SBA  Skilled Therapeutic Interventions/Progress Updates:    Session 1: Patient agreeable to participate in OT session. Reports *** pain level.   Patient participated in skilled OT session focusing on ***. Therapist facilitated/assessed/developed/educated/integrated/elicited *** in order to improve/facilitate/promote   Session 2: Patient agreeable to participate in OT session. Reports *** pain level.   Patient participated in skilled OT session focusing on ***. Therapist facilitated/assessed/developed/educated/integrated/elicited *** in order to improve/facilitate/promote    Therapy Documentation Precautions:  Precautions Precautions: Fall Recall of Precautions/Restrictions: Intact Restrictions Weight Bearing Restrictions Per Provider Order: No   Therapy/Group: Individual Therapy  Limmie Patricia, OTR/L,CBIS  Supplemental OT - [facility] and [facility] Secure Chat Preferred   05/26/2023, 9:58 PM

## 2023-05-27 DIAGNOSIS — D62 Acute posthemorrhagic anemia: Secondary | ICD-10-CM

## 2023-05-27 DIAGNOSIS — I10 Essential (primary) hypertension: Secondary | ICD-10-CM

## 2023-05-27 DIAGNOSIS — M19072 Primary osteoarthritis, left ankle and foot: Secondary | ICD-10-CM

## 2023-05-27 DIAGNOSIS — E1165 Type 2 diabetes mellitus with hyperglycemia: Secondary | ICD-10-CM

## 2023-05-27 LAB — CBC
HCT: 23.5 % — ABNORMAL LOW (ref 36.0–46.0)
HCT: 24 % — ABNORMAL LOW (ref 36.0–46.0)
Hemoglobin: 7.8 g/dL — ABNORMAL LOW (ref 12.0–15.0)
Hemoglobin: 8 g/dL — ABNORMAL LOW (ref 12.0–15.0)
MCH: 27.2 pg (ref 26.0–34.0)
MCH: 27.2 pg (ref 26.0–34.0)
MCHC: 33.2 g/dL (ref 30.0–36.0)
MCHC: 33.3 g/dL (ref 30.0–36.0)
MCV: 81.6 fL (ref 80.0–100.0)
MCV: 81.9 fL (ref 80.0–100.0)
Platelets: 288 10*3/uL (ref 150–400)
Platelets: 297 10*3/uL (ref 150–400)
RBC: 2.87 MIL/uL — ABNORMAL LOW (ref 3.87–5.11)
RBC: 2.94 MIL/uL — ABNORMAL LOW (ref 3.87–5.11)
RDW: 18.9 % — ABNORMAL HIGH (ref 11.5–15.5)
RDW: 19.4 % — ABNORMAL HIGH (ref 11.5–15.5)
WBC: 7.9 10*3/uL (ref 4.0–10.5)
WBC: 8.6 10*3/uL (ref 4.0–10.5)
nRBC: 0 % (ref 0.0–0.2)
nRBC: 0 % (ref 0.0–0.2)

## 2023-05-27 LAB — BASIC METABOLIC PANEL
Anion gap: 6 (ref 5–15)
BUN: 20 mg/dL (ref 6–20)
CO2: 22 mmol/L (ref 22–32)
Calcium: 8.2 mg/dL — ABNORMAL LOW (ref 8.9–10.3)
Chloride: 108 mmol/L (ref 98–111)
Creatinine, Ser: 1.14 mg/dL — ABNORMAL HIGH (ref 0.44–1.00)
GFR, Estimated: >60 mL/min (ref >60)
Glucose, Bld: 231 mg/dL — ABNORMAL HIGH (ref 70–99)
Potassium: 4 mmol/L (ref 3.5–5.1)
Sodium: 136 mmol/L (ref 135–145)

## 2023-05-27 LAB — GLUCOSE, CAPILLARY
Glucose-Capillary: 253 mg/dL — ABNORMAL HIGH (ref 70–99)
Glucose-Capillary: 122 mg/dL — ABNORMAL HIGH (ref 70–99)
Glucose-Capillary: 218 mg/dL — ABNORMAL HIGH (ref 70–99)
Glucose-Capillary: 300 mg/dL — ABNORMAL HIGH (ref 70–99)

## 2023-05-27 MED ORDER — DICLOFENAC SODIUM 1 % EX GEL
2.0000 g | Freq: Three times a day (TID) | CUTANEOUS | Status: DC
  Administered 2023-05-27 – 2023-05-29 (×5): 2 g via TOPICAL
  Filled 2023-05-27: qty 100

## 2023-05-27 NOTE — Patient Care Conference (Incomplete)
 Inpatient RehabilitationTeam Conference and Plan of Care Update Date: 05/27/2023   Time: 10:49 AM    Patient Name: Melinda Stone      Medical Record Number: [id number]  Date of Birth: 05/01/1980 Sex: Female         Room/Bed: 4M11C/4M11C-01 Payor Info: Payor: [employer] MEDICARE / Plan: [employer]ia [employer] MEDICARE HMO/PPO / Product Type: *No Product type* /    Admit Date/Time:  05/23/2023  3:44 PM  Primary Diagnosis:  Debility  Hospital Problems: Principal Problem:   Debility Active Problems:   Respiratory failure [facility])    Expected Discharge Date: Expected Discharge Date: 05/29/23  Team Members Present: Physician leading conference: Dr. Faith Rogue Social Worker Present: Dossie Der, LCSW Nurse Present: Chana Bode, RN PT Present: Ambrose Finland, PT OT Present: Lou Cal, OT SLP Present: Other (comment) Alvera Novel, SLP)     Current Status/Progress Goal Weekly Team Focus  Bowel/Bladder      Continent of bowel and bladder          Swallow/Nutrition/ Hydration   regular/thin           ADL's   Mod I level for ADL participation   Mod I   Discharge planning    Mobility   at goal level   mod I  D/C planning    Communication   [facility]            Safety/Cognition/ Behavioral Observations  WFL            Pain      Tylenol  and Ultram prn ankle pain. Xray of ankle, ice/heat added    Pain < 4 with prn meds    Assess pain q shift and effectiveness and need for prn medications. Ice/heat not much benefit.  Skin      N/A           Discharge Planning:  HOme with minor children with daughter whom lives in same complex checking on her. Goals of mod/i level.   Team Discussion: Patient doing well post Influenza A/pneumonia with oropharyngeal dysphagia and anemia.  Patient on target to meet rehab goals: yes  *See Care Plan and progress notes for long and short-term goals.   Revisions to Treatment Plan:  N/A   Teaching Needs: Safety,  medications, dietary modifications, transfers, toileting, etc.   Current Barriers to Discharge: Decreased caregiver support and Nutritional means  Possible Resolutions to Barriers: Family education OP follow up services DME: shower chair, rollator     Medical Summary Current Status: debility after respiratory arrest. anemic s/p transfusion, left ankle pain likely d/t OA. poorly controlled diabetes  Barriers to Discharge: Medical stability;Uncontrolled Diabetes;Uncontrolled Pain   Possible Resolutions to [employer]: adjusting insulin regimen, ASO for left ankle, voltaren gel. s/p blood transfusion, fe++ supps   Continued Need for Acute Rehabilitation Level of Care: The patient requires daily medical management by a physician with specialized training in physical medicine and rehabilitation for the following reasons: Direction of a multidisciplinary physical rehabilitation program to maximize functional independence : Yes Medical management of patient stability for increased activity during participation in an intensive rehabilitation regime.: Yes Analysis of laboratory values and/or radiology reports with any subsequent need for medication adjustment and/or medical intervention. : Yes   I attest that I was present, lead the team conference, and concur with the assessment and plan of the team.   Chana Bode B 05/28/2023, 7:53 PM

## 2023-05-27 NOTE — Progress Notes (Signed)
 Occupational Therapy Session Note  Patient Details  Name: ARIAN MCQUITTY MRN: [id number] Date of Birth: 06-18-1980  Today's Date: 05/27/2023 OT Individual Time: 1050-1130 OT Individual Time Calculation (min): 40 min    Short Term Goals: Week 1:  OT Short Term Goal 1 (Week 1): Patient to perform shower with SBA OT Short Term Goal 2 (Week 1): Patient to perform grooming standing at sink x 4 minutes with SBA OT Short Term Goal 3 (Week 1): Patient to perform tub bench transfer with SBA  Skilled Therapeutic Interventions/Progress Updates:    Pt received supine with 8/10 L ankle pain, agreeable to OT session. Notified RN and he delivered pain medication. She reported B foot swelling and provided B ted hose. She was able to don the R one with no assist ,min A to pull over heel on the L d/t ankle pain. She completed 100 ft of functional mobility to the therapy gym with (S) using the rollator. Pt completed the NuStep to challenge BUE/BLE strength and endurance needed to complete ADLs and IADLs with the highest level of independence. Cueing provided to increase intensity, 13 min completed. Pt completed various core stabilization exercises EOM, with focus on managing intraabdominal pressure, core strengthening, and coordination to challenge dynamic sitting balance, improved stability in standing, and ultimately reduced fall risk during ADLs. Skilled cueing required for proper muscle engagement, as well as grading of resistance to provide appropriate difficulty level. Pt returned to her room following and requested to be left on the toilet to attempt void. She verbalized agreement to call NT prior to getting up or attempting hygiene. NT also made aware of pt position.    Therapy Documentation Precautions:  Precautions Precautions: Fall Recall of Precautions/Restrictions: Intact Restrictions Weight Bearing Restrictions Per Provider Order: No  Therapy/Group: Individual Therapy  Crissie Reese 05/27/2023, 6:14 AM

## 2023-05-27 NOTE — Patient Instructions (Signed)
 1) Strengthening: Chest Pull - Resisted   Hold Theraband in front of body with hands about shoulder width a part. Pull band a part and back together slowly. Repeat __15-20__ times. Complete ___1_ set(s) per session.. Repeat ___1_ session(s) per day.  http://orth.exer.us/926   Copyright  VHI. All rights reserved.   2) PNF Strengthening: Resisted   Standing with resistive band around each hand, bring right arm up and away, thumb back. Repeat _15-20___ times per set. Do __1__ sets per session. Do __1__ sessions per day.    3) Resisted External Rotation: in Neutral - Bilateral   Sit or stand, tubing in both hands, elbows at sides, bent to 90, forearms forward. Pinch shoulder blades together and rotate forearms out. Keep elbows at sides. Repeat _15-20___ times per set. Do __1__ sets per session. Do _1___ sessions per day.  http://orth.exer.us/966   Copyright  VHI. All rights reserved.   4) PNF Strengthening: Resisted   Standing, hold resistive band above head. Bring right arm down and out from side. Repeat _15-20___ times per set. Do __1__ sets per session. Do __1__ sessions per day.  http://orth.exer.us/922   Copyright  VHI. All rights reserved.    ELASTIC BAND TRICEPS EXTENSION - SELF FIXATION  While seated, hold and fixate one end of an elastic band against your chest. Hold the other end with your opposite hand with your elbow bent and arm by your side.   Start by pulling the band downward so that the elbow goes from a bent position to a straightened position as shown. Return to starting position and repeat.  Complete 15-20 repetitions, 1 set.     Theraband Elbow Flexion  Loop the middle of the band around one foot  With arms straight by side and palms facing forward, hold onto each end of the band Bend arms bringing hands toward shoulders, keeping elbows by your side Return to starting position . Complete 15-20 repetitions, 1 set.

## 2023-05-27 NOTE — Progress Notes (Signed)
 Occupational Therapy Session Note  Patient Details  Name: Melinda Stone MRN: [id number] Date of Birth: 1980-07-09  {CHL IP REHAB OT TIME CALCULATIONS:[id number]}   Short Term Goals: Week 1:  OT Short Term Goal 1 (Week 1): Patient to perform shower with SBA OT Short Term Goal 2 (Week 1): Patient to perform grooming standing at sink x 4 minutes with SBA OT Short Term Goal 3 (Week 1): Patient to perform tub bench transfer with SBA  Skilled Therapeutic Interventions/Progress Updates:    Patient agreeable to participate in OT session. Reports *** pain level.   Patient participated in skilled OT session focusing on ***. Therapist facilitated/assessed/developed/educated/integrated/elicited *** in order to improve/facilitate/promote    Therapy Documentation Precautions:  Precautions Precautions: Fall Recall of Precautions/Restrictions: Intact Restrictions Weight Bearing Restrictions Per Provider Order: No  Therapy/Group: Individual Therapy  Limmie Patricia, OTR/L,CBIS  Supplemental OT - [facility] Secure Chat Preferred   05/27/2023, 11:42 PM

## 2023-05-27 NOTE — Progress Notes (Signed)
 PROGRESS NOTE   Subjective/Complaints:  Still complaining of left ankle pain. It doesn't appear overly severe. She's aware xrays didn't show very much. She's tried ice and heat without much benefit. Has tennis shoes with her which don't fit  ROS: Patient denies fever, rash, sore throat, blurred vision, dizziness, nausea, vomiting, diarrhea, cough, shortness of breath or chest pain,   headache, or mood change.   Objective:   DG Ankle Complete Left Result Date: 05/26/2023 CLINICAL DATA:  [id number] Acute left ankle pain [id number] EXAM: LEFT ANKLE COMPLETE - 3+ VIEW COMPARISON:  March 25, 2023 FINDINGS: Osteopenia. No acute fracture or dislocation. Joint spaces and alignment are maintained. No area of erosion or osseous destruction. No unexpected radiopaque foreign body. Soft tissues are unremarkable. IMPRESSION: No acute fracture or dislocation. Electronically Signed   By: Meda Klinefelter M.D.   On: 05/26/2023 12:32   Recent Labs    05/27/23 0040 05/27/23 0550  WBC 8.6 7.9  HGB 8.0* 7.8*  HCT 24.0* 23.5*  PLT 288 297   Recent Labs    05/26/23 1329 05/27/23 0550  NA 135 136  K 4.0 4.0  CL 107 108  CO2 21* 22  GLUCOSE 227* 231*  BUN 18 20  CREATININE 0.93 1.14*  CALCIUM 8.5* 8.2*    Intake/Output Summary (Last 24 hours) at 05/27/2023 1255 Last data filed at 05/27/2023 0725 Gross per 24 hour  Intake 1696 ml  Output --  Net 1696 ml        Physical Exam: Vital Signs Blood pressure (!) 140/74, pulse 85, temperature 98.6 F (37 C), temperature source Oral, resp. rate 18, height (P) 5\' 9"  (1.753 m), weight 95.1 kg, SpO2 100%.    Constitutional: No distress . Vital signs reviewed. HEENT: NCAT, EOMI, oral membranes moist Neck: supple Cardiovascular: RRR without murmur. No JVD    Respiratory/Chest: CTA Bilaterally without wheezes or rales. Normal effort    GI/Abdomen: BS +, non-tender, non-distended Ext: no clubbing,  cyanosis  Psych: pleasant and cooperative  GU didn't assess vaginal yeast Extremities: No clubbing, cyanosis, or edema. Pulses are 2+ Musc: mild swelling and tenderness along left lateral malleolus. Minimal pain with PROM Skin: Clean and intact without signs of breakdown Neuro:     Mental Status: AAOx3, memory grossly intact Speech/Languate: Follows simple commands CRANIAL NERVES: 2 through 12 grossly intact   MOTOR: UE/LE 4+ to 5/5. Good sitting balance   SENSORY: Altered in distal feet- chronic     Assessment/Plan: 1. Functional deficits which require 3+ hours per day of interdisciplinary therapy in a comprehensive inpatient rehab setting. Physiatrist is providing close team supervision and 24 hour management of active medical problems listed below. Physiatrist and rehab team continue to assess barriers to discharge/monitor patient progress toward functional and medical goals  Care Tool:  Bathing    Body parts bathed by patient: Right arm, Left arm, Chest, Abdomen, Front perineal area, Buttocks, Right upper leg, Left upper leg, Right lower leg, Left lower leg, Face         Bathing assist Assist Level: Contact Guard/Touching assist     Upper Body Dressing/Undressing Upper body dressing   What is the patient wearing?: Hospital  gown only    Upper body assist Assist Level: Set up assist    Lower Body Dressing/Undressing Lower body dressing      What is the patient wearing?: Underwear/pull up, Pants     Lower body assist Assist for lower body dressing: Contact Guard/Touching assist     Toileting Toileting    Toileting assist Assist for toileting: Contact Guard/Touching assist     Transfers Chair/bed transfer  Transfers assist     Chair/bed transfer assist level: Supervision/Verbal cueing     Locomotion Ambulation   Ambulation assist      Assist level: Supervision/Verbal cueing Assistive device: Walker-rolling Max distance: 175'   Walk 10  feet activity   Assist     Assist level: Supervision/Verbal cueing Assistive device: Walker-rolling   Walk 50 feet activity   Assist    Assist level: Supervision/Verbal cueing Assistive device: Walker-rolling    Walk 150 feet activity   Assist    Assist level: Supervision/Verbal cueing Assistive device: Walker-rolling    Walk 10 feet on uneven surface  activity   Assist     Assist level: Contact Guard/Touching assist Assistive device: Walker-rolling   Wheelchair     Assist Is the patient using a wheelchair?: No             Wheelchair 50 feet with 2 turns activity    Assist            Wheelchair 150 feet activity     Assist          Blood pressure (!) 140/74, pulse 85, temperature 98.6 F (37 C), temperature source Oral, resp. rate 18, height (P) 5\' 9"  (1.753 m), weight 95.1 kg, SpO2 100%.  Medical Problem List and Plan: 1. Functional deficits secondary to Debility due to hypoxic respiratory failure secondary to influenza pneumonia and asthma exacerbation              -patient may shower             -ELOS/Goals: 5-7 days , mod I PT/OT/SLP             -Continue CIR therapies including PT, OT SLP   2.  Antithrombotics: -DVT/anticoagulation:  Pharmaceutical: Heparin  3/9- held Heparin today due to bleeding/drop in Hb             -antiplatelet therapy: none   3. Pain Management: Tylenol, Flexeril as needed  -Patient reports she has not needed outpatient pain medications for the past years, appears oxycodone ordered by PDMP?  -Discussed magnesium for muscle cramps, she declines due to concern of diarrhea   3/8- pain contrlled with meds    Tramadol prn for L ankle pain  3/10 reviewed xrays with patient. Perhaps mild OA but othewise unremarkable films  -will add voltaren gel  -pt awaiting proper shoes from home.    4. Mood/Behavior/Sleep: LCSW to evaluate and provide emotional support             -continue Elavil 25 mg q HS              -antipsychotic agents: n/a   5. Neuropsych/cognition: This patient is capable of making decisions on her own behalf.   6. Skin/Wound Care: Routine skin care checks             -carb modified diet             -continue MVI, B-12   7. 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: CBGs QID; A1c = 5.0%             -continue SSI             -continue Lantus 30 units daily  -Increase Lantus to 33u daily   3/8- her Lantus was increased yesterday- still 200s will monitor trend  3/9- will increase lantus to 36 units daily- will start in AM CBG (last 3)  Recent Labs    05/26/23 2037 05/27/23 0557 05/27/23 1207  GLUCAP 327* 253* 122*   3/10 observe cbg's today as insulin change effective this morning    10: Asthma/acute respiratory failure due to influenza -continue Brovana/Pulimcort/Yulperi nebs (consider conversion to inhalers) -continue incentive spirometry   11: GERD: continue Protonix   12: Polyneuropathy: continue gabapentin 400 mg q 8 hours   13: Acute on chronic anemia, hx of Fe deficiency; status post 4 units PRBCs             -recheck CBC             -anemia has been worked up>>heavy menses, requires Venofer             -follow-up Dr. Cathie Hoops  -7/3 HGB down to 7.2, similar level on 3/2.  May be more fluid status/dilutional related.  Recheck tomorrow.   3/8- Hb 7.1- will recheck in AM and if lower, will transfuse  3/9- Hb 6/5- will give 2 units pRBCs'  3/10 follow up cbc 7.8 this morning after being 8.0 overnight post  transfusion   -check stools for OB   -fe supp   -repeat CBC in am 14: Class 1 obesity             Body mass index is 31.94 kg/m.   3/8- BMI 30.96 15: Hypokalemia and prior hyerkalemia: follow-up BMP  -CMP pending   3/10- k+ 4.0 16: AKI:  likely due to ATN, 3.02 --->1.22, now rising BUN/Cr back up to 62/1.47 on  3/4             -CMP results pending   3/10 bun/cr 20/1.14 17. Oropharyngeal dysphagia:             -upgraded to Carb mod/thin diet, continue SLP   18. Vaginal candidiasis  3/9- will give Diflucan 150 mg x1    LOS: 4 days A FACE TO FACE EVALUATION WAS PERFORMED  Ranelle Oyster 05/27/2023, 12:55 PM

## 2023-05-27 NOTE — Progress Notes (Signed)
 Physical Therapy Session Note  Patient Details  Name: Melinda Stone MRN: [id number] Date of Birth: May 12, 1980  Today's Date: 05/27/2023 PT Individual Time: 0915-0958 PT Individual Time Calculation (min): 43 min   Short Term Goals: Week 1:  PT Short Term Goal 1 (Week 1): STG = LTG due to ELOS  Skilled Therapeutic Interventions/Progress Updates:     Pt seated EOB upon arrival. Pt agreeable to therapy. Pt report 5/10 L ankle pain, increasing to 7/10 with gait,  therapist provided rest breaks and repositioning as needed. Pt reports pain on lateral mallolus of R ankle, noted swelling, therapist donned ice at end of session. MD present for AM rounds, ordered aircast.   Nurse present to administer medications at start of session.   Pt reports her family brought shoes, donned, however too small, so removed for pt comfort.   Pt ambulated room to gift shop, around gift shop, and up to room, with sup/mod I with use of rollator. Pt required 1 seated rest break 2/2 R ankle pain. Education provided regarding technqiue for opening/closing doors with use of rollator. Pt demos carry over throughout session.   Pt supine in bed at end of session with all needs within reach and bed alarm on.     Therapy Documentation Precautions:  Precautions Precautions: Fall Recall of Precautions/Restrictions: Intact Restrictions Weight Bearing Restrictions Per Provider Order: No  Therapy/Group: Individual Therapy  [facility] Ambrose Finland, [place], DPT  05/27/2023, 7:44 AM

## 2023-05-28 ENCOUNTER — Inpatient Hospital Stay: Payer: 59

## 2023-05-28 ENCOUNTER — Inpatient Hospital Stay: Payer: 59 | Admitting: Oncology

## 2023-05-28 LAB — CBC
HCT: 24.8 % — ABNORMAL LOW (ref 36.0–46.0)
Hemoglobin: 8.4 g/dL — ABNORMAL LOW (ref 12.0–15.0)
MCH: 27.5 pg (ref 26.0–34.0)
MCHC: 33.9 g/dL (ref 30.0–36.0)
MCV: 81 fL (ref 80.0–100.0)
Platelets: 293 10*3/uL (ref 150–400)
RBC: 3.06 MIL/uL — ABNORMAL LOW (ref 3.87–5.11)
RDW: 19.2 % — ABNORMAL HIGH (ref 11.5–15.5)
WBC: 9.8 10*3/uL (ref 4.0–10.5)
nRBC: 0 % (ref 0.0–0.2)

## 2023-05-28 LAB — GLUCOSE, CAPILLARY
Glucose-Capillary: 158 mg/dL — ABNORMAL HIGH (ref 70–99)
Glucose-Capillary: 128 mg/dL — ABNORMAL HIGH (ref 70–99)
Glucose-Capillary: 198 mg/dL — ABNORMAL HIGH (ref 70–99)
Glucose-Capillary: 180 mg/dL — ABNORMAL HIGH (ref 70–99)

## 2023-05-28 LAB — OCCULT BLOOD X 1 CARD TO LAB, STOOL: Fecal Occult Bld: POSITIVE — AB

## 2023-05-28 MED ORDER — INSULIN GLARGINE 100 UNIT/ML ~~LOC~~ SOLN
40.0000 [IU] | Freq: Every day | SUBCUTANEOUS | Status: DC
  Administered 2023-05-29: 40 [IU] via SUBCUTANEOUS
  Filled 2023-05-28: qty 0.4

## 2023-05-28 MED ORDER — FERROUS SULFATE 325 (65 FE) MG PO TABS
325.0000 mg | ORAL_TABLET | Freq: Every day | ORAL | Status: DC
  Administered 2023-05-28 – 2023-05-29 (×2): 325 mg via ORAL
  Filled 2023-05-28 (×3): qty 1

## 2023-05-28 NOTE — Progress Notes (Signed)
 Physical Therapy Discharge Summary  Patient Details  Name: Melinda Stone MRN: [id number] Date of Birth: [birth year]  Date of Discharge from PT service:May 28, 2023  Today's Date: 05/28/2023 PT Individual Time: [id number] PT Individual Time Calculation (min): 57 min, 65 min  and Today's Date: 05/28/2023 PT Missed Time: 10 Minutes Missed Time Reason: Patient fatigue   Patient has met 7 of 7 long term goals due to improved activity tolerance, improved balance, improved postural control, decreased pain, ability to compensate for deficits, improved awareness, and improved coordination.  Patient to discharge at an ambulatory level Modified Independent.      Recommendation:  Patient will benefit from ongoing skilled PT services in outpatient setting to continue to advance safe functional mobility, address ongoing impairments in pain, gait, endurance, dynamic balance, and minimize fall risk.  Equipment: Rollator and shower chair   Reasons for discharge: treatment goals met and discharge from hospital  Patient/family agrees with progress made and goals achieved: Yes  PT Discharge Precautions/Restrictions Precautions Precautions: Fall Recall of Precautions/Restrictions: Intact Restrictions Weight Bearing Restrictions Per Provider Order: No Pain Interference Pain Interference Pain Effect on Sleep: 1. Rarely or not at all Pain Interference with Therapy Activities: 1. Rarely or not at all Pain Interference with Day-to-Day Activities: 1. Rarely or not at all Vision/Perception  Vision - History Ability to See in Adequate Light: 0 Adequate Perception Perception: Within Functional Limits Praxis Praxis: WFL  Cognition Overall Cognitive Status: Within Functional Limits for tasks assessed Arousal/Alertness: Awake/alert Orientation Level: Oriented X4 Sustained Attention: Appears intact Selective Attention: Appears intact Alternating Attention: Appears intact Memory:  Appears intact Awareness: Appears intact Problem Solving: Appears intact Reasoning: Appears intact Sequencing: Appears intact Organizing: Appears intact Decision Making: Appears intact Safety/Judgment: Appears intact Sensation Sensation Light Touch: Impaired by gross assessment Hot/Cold: Appears Intact Proprioception: Appears Intact Stereognosis: Appears Intact Additional Comments: limited 2/2 B LE neuroapthy Coordination Gross Motor Movements are Fluid and Coordinated: No Fine Motor Movements are Fluid and Coordinated: Yes Coordination and Movement Description: Generalized debility Motor  Motor Motor: Other (comment) Motor - Skilled Clinical Observations: Generalized weakness and deconditioning  Mobility Bed Mobility Bed Mobility: Sit to Supine;Supine to Sit;Rolling Left;Rolling Right Rolling Right: Independent Rolling Left: Independent Supine to Sit: Independent Sit to Supine: Independent Transfers Transfers: Sit to Stand;Transfer;Stand to Sit;Stand Pivot Transfers Sit to Stand: Independent with assistive device Stand to Sit: Independent with assistive device Stand Pivot Transfers: Independent with assistive device Transfer (Assistive device): Rollator Locomotion  Gait Ambulation: Yes Gait Distance (Feet): 1000 Feet Assistive device: Rollator Gait Gait: Yes Gait Pattern: Impaired Gait Pattern: Step-through pattern;Poor foot clearance - left;Poor foot clearance - right Stairs / Additional Locomotion Stairs: No (pt refused) Wheelchair Mobility Wheelchair Mobility: No  Trunk/Postural Assessment  Cervical Assessment Cervical Assessment: Exceptions to [facility] (forward head) Thoracic Assessment Thoracic Assessment: Within Functional Limits Lumbar Assessment Lumbar Assessment: Exceptions to [facility] (chronic low back pain precautions) Postural Control Postural Control: Within Functional Limits  Balance Balance Balance Assessed: Yes Standardized Balance  Assessment Standardized Balance Assessment: Timed Up and Go Test Timed Up and Go Test TUG: Normal TUG Normal TUG (seconds): 19.5 Static Sitting Balance Static Sitting - Balance Support: Feet supported;No upper extremity supported Static Sitting - Level of Assistance: 7: Independent Dynamic Sitting Balance Dynamic Sitting - Balance Support: Feet supported;No upper extremity supported Dynamic Sitting - Level of Assistance: 6: Modified independent (Device/Increase time) Static Standing Balance Static Standing - Balance Support: Bilateral upper extremity supported;During functional activity Static Standing - Level of  Assistance: 7: Independent Dynamic Standing Balance Dynamic Standing - Balance Support: During functional activity;Bilateral upper extremity supported Dynamic Standing - Level of Assistance: 6: Modified independent (Device/Increase time) Extremity Assessment  RUE Assessment RUE Assessment: Within Functional Limits General Strength Comments: grip: 55# LUE Assessment LUE Assessment: Within Functional Limits General Strength Comments: grip: 55# RLE Assessment RLE Assessment: Exceptions to [facility] General Strength Comments: Grossly 4/5 LLE Assessment LLE Assessment: Exceptions to [facility] General Strength Comments: Grossly 4/5  Treatment Session 1   Pt supine in bed upon arrival. Pt agreeable to therapy. Pt reports mild R ankle pain, however reports improved with R ankle brace, therapist provided rest breaks as needed.   Pt ankle brace delivered to room, therapist donned, pt ambulated ~30 feet with rollator and ankle brace and reports improved stability, and pain relief.   Therapist reassessed 6 minute walk--pt ambulated 555 feet in 6 minutes with use of rollator and R ankle brace--pt did not require seated rest break until after 1068 feet. Pt demos significant improvement in endurance in comparison to eval in which she walked 26ft total with a 1.5 minute seated rest at 3 minute  mark.   Therapist reassessed TUG-pt performed 2 trials with use of rollator trail 1: 21.59, trail 2 18.14; pt completed 1 trial with no AD- 16.65 seconds (however pt reports increased pain in hips/back without AD). Scores > 15 seconds indicate increased fall risk. Education provided regarding pt fall risk. Pt able to provide teach back of methods to reduce fall risk including wearing shoes, use of rollator, taking seated rest breaks as needed with fatigue.   Pt provided teach back of need to wear sneakers and perform daily skin inspection with use of skin inspection mirror to assess for pressure sores 2/2 diabetes and polyneuropathy.   Pt made mod I in room at end of session.    Treatment Session 2   Pt supine in bed upon arrival. Pt agreeable to therapy. Pt reports fatigue from previous sessions today, and R ankle pain. Ice donned to R ankle at end of session.    Pt still wearing ankle brace at this time, doffed ankle brace, and reviewed and performed the following HEP.   Access Code: [email or web address] URL: [email or web address] Date: 05/28/2023 Prepared by: Ambrose Finland  Exercises - Seated Ankle Pumps  - 1 x daily - 7 x weekly - 3 sets - 10 reps - Seated Ankle Inversion with Resistance and Legs Crossed  - 1 x daily - 7 x weekly - 3 sets - 10 reps - CLX Ankle Dorsiflexion and Eversion  - 1 x daily - 7 x weekly - 3 sets - 10 reps - Gastroc Stretch on Wall  - 1 x daily - 7 x weekly - 3 sets - 10 reps - Heel Raises with Counter Support  - 1 x daily - 7 x weekly - 3 sets - 10 reps  Education provided to wear ankle brace when up for improved comfort/pain management, but to take off while resting for long periods.   Pt returned demonstration of donning/doffing R LE brace.   Pt reports they delivered the shower chair but not the rollator 2/2 rollator being out of stock. Notified Child psychotherapist. Education provided how to adjust height of rollator when it arrives with emphasis on the  handle bar lining up with wrist crease. Pt verbalized understanding and agreeable.   Pt missed 10 minutes at end of session 2/2 pain/fatigue.    [facility] Ambrose Finland, [facility], DPT  05/28/2023, 10:39 AM

## 2023-05-28 NOTE — Progress Notes (Signed)
 Inpatient Rehabilitation Care Coordinator Discharge Note   Patient Details  Name: Melinda Stone MRN: [id number] Date of Birth: 06/01/1980   Discharge location: HOME WITH MINOR CHILDREN WITH 41 YO DAUGHTER CHECKING ON  Length of Stay: 6 days  Discharge activity level: MOD/I LEVEL  Home/community participation: ACTIVE  Patient response WU:XLKGMW Literacy - How often do you need to have someone help you when you read instructions, pamphlets, or other written material from your doctor or pharmacy?: Never  Patient response NU:UVOZDG Isolation - How often do you feel lonely or isolated from those around you?: Never  Services provided included: MD, RD, PT, OT, RN, CM, Pharmacy, SW  Financial Services:  Field seismologist Utilized: Private Insurance [employer] MEDICARE AND MEDICAID  Choices offered to/list presented to: PT  Follow-up services arranged:  Outpatient, DME, Patient/Family has no preference for HH/DME agencies    Outpatient Servicies: CARDIAC/PULMONARY REHAB MD TO MAKE REFERRAL DME : [facility]  ROLLOATOR AND TUB SEAT    Patient response to transportation need: Is the patient able to respond to transportation needs?: Yes In the past 12 months, has lack of transportation kept you from medical appointments or from getting medications?: No In the past 12 months, has lack of transportation kept you from meetings, work, or from getting things needed for daily living?: No   Patient/Family verbalized understanding of follow-up arrangements:  Yes  Individual responsible for coordination of the follow-up plan: SELF [phone]  Confirmed correct DME delivered: Melinda Stone 05/28/2023    Comments (or additional information):PT DID WELL AND REACHED HER GOALS QUICKLY. GLAD TO BE GOING HOME   Summary of Stay    Date/Time Discharge Planning CSW  05/27/23 1021 HOme with minor children with daughter whom lives in same complex checking on her. Goals of mod/i level.  RGD       Melinda Stone

## 2023-05-28 NOTE — Discharge Instructions (Addendum)
 Inpatient Rehab Discharge Instructions  [name] Discharge date and time: No discharge date for patient encounter.   Activities/Precautions/ Functional Status: Activity: no lifting, driving, or strenuous exercise for until follow-up with MD Diet: regular diet Wound Care: none needed Functional status:  ___ No restrictions     ___ Walk up steps independently ___ 24/7 supervision/assistance   ___ Walk up steps with assistance _x__ Intermittent supervision/assistance  ___ Bathe/dress independently ___ Walk with walker     ___ Bathe/dress with assistance ___ Walk Independently    ___ Shower independently ___ Walk with assistance    _x__ Shower with assistance _x__ No alcohol     ___ Return to work/school ________  Special Instructions:  COMMUNITY REFERRALS UPON DISCHARGE:    CARDIAC/PULMONARY REHAB REFERRAL MADE WILL FOLLOW UP WITH YOU               Medical Equipment/Items Ordered: ROLLATOR AND TUB SEAT                                                 Agency/Supplier: [facility]   [phone]   My questions have been answered and I understand these instructions. I will adhere to these goals and the provided educational materials after my discharge from the hospital.  Patient/Caregiver Signature _______________________________ Date __________  Clinician Signature _______________________________________ Date __________  Please bring this form and your medication list with you to all your follow-up doctor's appointments.

## 2023-05-28 NOTE — Progress Notes (Signed)
 Orthopedic Tech Progress Note Patient Details:  Melinda Stone 1980-04-24 [id number]  Ortho Devices Type of Ortho Device: ASO Ortho Device/Splint Location: LLE Ortho Device/Splint Interventions: Ordereddropped off ASO to room    Post Interventions Patient Tolerated: Well Instructions Provided: Care of device  Donald Pore 05/28/2023, 9:50 AM

## 2023-05-28 NOTE — Progress Notes (Signed)
 Physical Therapy Session Note  Patient Details  Name: Melinda Stone MRN: [id number] Date of Birth: 1980-07-12  Today's Date: 05/28/2023 PT Individual Time: 1128-1200 PT Individual Time Calculation (min): 32 min   Short Term Goals: Week 1:  PT Short Term Goal 1 (Week 1): STG = LTG due to ELOS  Skilled Therapeutic Interventions/Progress Updates: Pt presents walking in room and agreeable to make-up minutes.  Pt amb w/ rollator >400' w/ mod I, w/ noted antalgic gait to L ankle.  Pt amb to 4N hallway and placed rollator appropriately against wall for rest.  Pt returned to room w/ same A and transfers to bed w/ mod I.  Pt stopped during gait to request pain meds from nursing.  Ice pack applied to L ankle.  Pt remained in bed.       Therapy Documentation Precautions:  Precautions Precautions: Fall Recall of Precautions/Restrictions: Intact Restrictions Weight Bearing Restrictions Per Provider Order: No General:   Vital Signs:   Pain:8/10 w/ amb, 7/10 initially. Pain Assessment Pain Scale: 0-10 Pain Score: 8  Pain Type: Acute pain Pain Location: Ankle Pain Orientation: Left Pain Descriptors / Indicators: Aching Pain Frequency: Intermittent Pain Onset: On-going Pain Intervention(s): Medication (See eMAR) Mobility: Bed Mobility Bed Mobility: Sit to Supine;Supine to Sit;Rolling Left;Rolling Right Rolling Right: Independent Rolling Left: Independent Supine to Sit: Independent Sit to Supine: Independent Transfers Transfers: Sit to Stand;Transfer;Stand to Sit;Stand Pivot Transfers Sit to Stand: Independent with assistive device Stand to Sit: Independent with assistive device Stand Pivot Transfers: Independent with assistive device Transfer (Assistive device): Rollator Locomotion : Gait Ambulation: Yes Gait Distance (Feet): 1000 Feet Assistive device: Rollator Gait Gait: Yes Gait Pattern: Impaired Gait Pattern: Step-through pattern;Poor foot clearance - left;Poor  foot clearance - right Stairs / Additional Locomotion Stairs: No (pt refused) Wheelchair Mobility Wheelchair Mobility: No  Trunk/Postural Assessment : Cervical Assessment Cervical Assessment: Exceptions to [facility] (forward head) Thoracic Assessment Thoracic Assessment: Within Functional Limits Lumbar Assessment Lumbar Assessment: Exceptions to [facility] (chronic low back pain precautions) Postural Control Postural Control: Within Functional Limits  Balance: Balance Balance Assessed: Yes Standardized Balance Assessment Standardized Balance Assessment: Timed Up and Go Test Timed Up and Go Test TUG: Normal TUG Normal TUG (seconds): 19.5 Static Sitting Balance Static Sitting - Balance Support: Feet supported;No upper extremity supported Static Sitting - Level of Assistance: 7: Independent Dynamic Sitting Balance Dynamic Sitting - Balance Support: Feet supported;No upper extremity supported Dynamic Sitting - Level of Assistance: 6: Modified independent (Device/Increase time) Static Standing Balance Static Standing - Balance Support: Bilateral upper extremity supported;During functional activity Static Standing - Level of Assistance: 7: Independent Dynamic Standing Balance Dynamic Standing - Balance Support: During functional activity;Bilateral upper extremity supported Dynamic Standing - Level of Assistance: 6: Modified independent (Device/Increase time) Exercises:   Other Treatments:      Therapy/Group: Individual Therapy  Lucio Edward 05/28/2023, 12:13 PM

## 2023-05-28 NOTE — Progress Notes (Signed)
 PROGRESS NOTE   Subjective/Complaints:  Overall doing well. Anxious to get home for her son's 16[age] birthday tomorrow!  Left ankle still a bit tender, voltaren without a lot of help yet. Doesn't have shoes.   Objective:   DG Ankle Complete Left Result Date: 05/26/2023 CLINICAL DATA:  [id number] Acute left ankle pain [id number] EXAM: LEFT ANKLE COMPLETE - 3+ VIEW COMPARISON:  March 25, 2023 FINDINGS: Osteopenia. No acute fracture or dislocation. Joint spaces and alignment are maintained. No area of erosion or osseous destruction. No unexpected radiopaque foreign body. Soft tissues are unremarkable. IMPRESSION: No acute fracture or dislocation. Electronically Signed   By: Meda Klinefelter M.D.   On: 05/26/2023 12:32   Recent Labs    05/27/23 0550 05/28/23 0513  WBC 7.9 9.8  HGB 7.8* 8.4*  HCT 23.5* 24.8*  PLT 297 293   Recent Labs    05/26/23 1329 05/27/23 0550  NA 135 136  K 4.0 4.0  CL 107 108  CO2 21* 22  GLUCOSE 227* 231*  BUN 18 20  CREATININE 0.93 1.14*  CALCIUM 8.5* 8.2*    Intake/Output Summary (Last 24 hours) at 05/28/2023 0846 Last data filed at 05/28/2023 0756 Gross per 24 hour  Intake 1070 ml  Output --  Net 1070 ml        Physical Exam: Vital Signs Blood pressure 116/65, pulse 88, temperature 98.2 F (36.8 C), resp. rate 18, height (P) 5\' 9"  (1.753 m), weight 95.1 kg, SpO2 100%.    Constitutional: No distress . Vital signs reviewed. HEENT: NCAT, EOMI, oral membranes moist Neck: supple Cardiovascular: RRR without murmur. No JVD    Respiratory/Chest: CTA Bilaterally without wheezes or rales. Normal effort    GI/Abdomen: BS +, non-tender, non-distended Ext: no clubbing, cyanosis, or edema Psych: pleasant and cooperative  GU didn't assess vaginal yeast Extremities: No clubbing, cyanosis, or edema. Pulses are 2+ Musc: still with some mild swelling and tenderness along left lateral malleolus. Minimal  pain with PROM--did not weight bear for me today Skin: Clean and intact without signs of breakdown Neuro:     Alert and oriented x 3. Normal insight and awareness. Intact Memory. Normal language and speech. Cranial nerve exam unremarkable. Marland Kitchen   MOTOR: UE/LE 4+ to 5/5. Good sitting balance   SENSORY: Altered in distal feet- chronic     Assessment/Plan: 1. Functional deficits which require 3+ hours per day of interdisciplinary therapy in a comprehensive inpatient rehab setting. Physiatrist is providing close team supervision and 24 hour management of active medical problems listed below. Physiatrist and rehab team continue to assess barriers to discharge/monitor patient progress toward functional and medical goals  Care Tool:  Bathing    Body parts bathed by patient: Right arm, Left arm, Chest, Abdomen, Front perineal area, Buttocks, Right upper leg, Left upper leg, Right lower leg, Left lower leg, Face         Bathing assist Assist Level: Independent with assistive device     Upper Body Dressing/Undressing Upper body dressing   What is the patient wearing?: Pull over shirt    Upper body assist Assist Level: Independent with assistive device    Lower Body Dressing/Undressing Lower  body dressing      What is the patient wearing?: Underwear/pull up, Pants     Lower body assist Assist for lower body dressing: Independent with assitive device     Toileting Toileting    Toileting assist Assist for toileting: Independent with assistive device     Transfers Chair/bed transfer  Transfers assist     Chair/bed transfer assist level: Independent with assistive device     Locomotion Ambulation   Ambulation assist      Assist level: Supervision/Verbal cueing Assistive device: Walker-rolling Max distance: 175'   Walk 10 feet activity   Assist     Assist level: Supervision/Verbal cueing Assistive device: Walker-rolling   Walk 50 feet  activity   Assist    Assist level: Supervision/Verbal cueing Assistive device: Walker-rolling    Walk 150 feet activity   Assist    Assist level: Supervision/Verbal cueing Assistive device: Walker-rolling    Walk 10 feet on uneven surface  activity   Assist     Assist level: Contact Guard/Touching assist Assistive device: Walker-rolling   Wheelchair     Assist Is the patient using a wheelchair?: No             Wheelchair 50 feet with 2 turns activity    Assist            Wheelchair 150 feet activity     Assist          Blood pressure 116/65, pulse 88, temperature 98.2 F (36.8 C), resp. rate 18, height (P) 5\' 9"  (1.753 m), weight 95.1 kg, SpO2 100%.  Medical Problem List and Plan: 1. Functional deficits secondary to Debility due to hypoxic respiratory failure secondary to influenza pneumonia and asthma exacerbation              -patient may shower             -ELOS/Goals:3/12 , mod I PT/OT/SLP             -pop-up team conference today  -finalize dc planning   2.  Antithrombotics: -DVT/anticoagulation:  pt is ambulating almost 200'   -dc heparin             -antiplatelet therapy: none   3. Pain Management: Tylenol, Flexeril as needed  -Patient reports she has not needed outpatient pain medications for the past years, appears oxycodone ordered by PDMP?  -Discussed magnesium for muscle cramps, she declines due to concern of diarrhea   3/8- pain contrlled with meds    Tramadol prn for L ankle pain  3/10 reviewed xrays with patient. Perhaps mild OA but othewise unremarkable films  3/11 -continue voltaren gel  -order ASO for ankle support -needs appropriately fitting shoes    4. Mood/Behavior/Sleep: LCSW to evaluate and provide emotional support             -continue Elavil 25 mg q HS             -antipsychotic agents: n/a   5. Neuropsych/cognition: This patient is capable of making decisions on her own behalf.   6. Skin/Wound  Care: Routine skin care checks             -carb modified diet             -continue MVI, B-12   7. 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: CBGs QID; A1c = 5.0%             -continue SSI             -continue Lantus 30 units daily  -Increase Lantus to 33u daily   3/8- her Lantus was increased yesterday- still 200s will monitor trend  3/9- will increase lantus to 36 units daily- will start in AM CBG (last 3)  Recent Labs    05/27/23 1638 05/27/23 2123 05/28/23 0627  GLUCAP 218* 300* 158*   3/11 adjust lantus to 40u daily   -she'll need outpt follow up for further mgt    10: Asthma/acute respiratory failure due to influenza -continue Brovana/Pulimcort/Yulperi nebs (consider conversion to inhalers) -continue incentive spirometry   11: GERD: continue Protonix   12: Polyneuropathy: continue gabapentin 400 mg q 8 hours   13: Acute on chronic anemia, hx of Fe deficiency; status post 4 units PRBCs                3/9- Hb 6/5- will give 2 units pRBCs'  3/11-no signs of gross blood loss   - OB sample pending   -add Fe++ supp   -hgb up to 8.4 today 14: Class 1 obesity             Body mass index is 31.94 kg/m.   3/8- BMI 30.96 15: Hypokalemia and prior hyerkalemia: follow-up BMP  -CMP pending   3/10- k+ 4.0 16: AKI:  likely due to ATN, 3.02 --->1.22, now rising BUN/Cr back up to 62/1.47 on  3/4             -CMP results pending   3/10 bun/cr 20/1.14 17. Oropharyngeal dysphagia:             -upgraded to Carb mod/thin diet,  continue SLP   18. Vaginal candidiasis  3/9- will give Diflucan 150 mg x1    LOS: 5 days A FACE TO FACE EVALUATION WAS PERFORMED  Ranelle Oyster 05/28/2023, 8:46 AM

## 2023-05-28 NOTE — Plan of Care (Signed)
°  Problem: RH Balance °Goal: LTG Patient will maintain dynamic standing balance (PT) °Description: LTG:  Patient will maintain dynamic standing balance with assistance during mobility activities (PT) °Outcome: Completed/Met °  °Problem: Sit to Stand °Goal: LTG:  Patient will perform sit to stand with assistance level (PT) °Description: LTG:  Patient will perform sit to stand with assistance level (PT) °Outcome: Completed/Met °  °Problem: RH Bed Mobility °Goal: LTG Patient will perform bed mobility with assist (PT) °Description: LTG: Patient will perform bed mobility with assistance, with/without cues (PT). °Outcome: Completed/Met °  °Problem: RH Bed to Chair Transfers °Goal: LTG Patient will perform bed/chair transfers w/assist (PT) °Description: LTG: Patient will perform bed to chair transfers with assistance (PT). °Outcome: Completed/Met °  °Problem: RH Car Transfers °Goal: LTG Patient will perform car transfers with assist (PT) °Description: LTG: Patient will perform car transfers with assistance (PT). °Outcome: Completed/Met °  °Problem: RH Ambulation °Goal: LTG Patient will ambulate in controlled environment (PT) °Description: LTG: Patient will ambulate in a controlled environment, # of feet with assistance (PT). °Outcome: Completed/Met °Goal: LTG Patient will ambulate in home environment (PT) °Description: LTG: Patient will ambulate in home environment, # of feet with assistance (PT). °Outcome: Completed/Met °  °

## 2023-05-28 NOTE — Progress Notes (Addendum)
 Patient ID: Melinda Stone, female   DOB: 12-11-1980, 43 y.o.   MRN: [id number]  Team feels ready for discharge from their standpoint awaiting MD's input regarding medical readiness. Await response. Pt aware equipment should be delivered to room today  10:54 AM MD feels medically stable for DC home tomorrow. Pt made aware of this  3:30 PM Pt's rolllator has not been delivered have checked with [employer] they are out of stock and will deliver to the home. She will need this by tomorrow when she goes home. They have been made aware of this. Will check on am

## 2023-05-29 ENCOUNTER — Encounter: Payer: Self-pay | Admitting: Oncology

## 2023-05-29 ENCOUNTER — Ambulatory Visit (HOSPITAL_BASED_OUTPATIENT_CLINIC_OR_DEPARTMENT_OTHER): Payer: 59 | Admitting: Obstetrics & Gynecology

## 2023-05-29 ENCOUNTER — Other Ambulatory Visit (HOSPITAL_COMMUNITY): Payer: Self-pay

## 2023-05-29 ENCOUNTER — Other Ambulatory Visit (HOSPITAL_BASED_OUTPATIENT_CLINIC_OR_DEPARTMENT_OTHER): Payer: 59

## 2023-05-29 LAB — GLUCOSE, CAPILLARY: Glucose-Capillary: 216 mg/dL — ABNORMAL HIGH (ref 70–99)

## 2023-05-29 MED ORDER — ADULT MULTIVITAMIN W/MINERALS CH: 1.0000 | ORAL_TABLET | Freq: Every day | ORAL | Status: AC

## 2023-05-29 MED ORDER — ACETAMINOPHEN 325 MG PO TABS
325.0000 mg | ORAL_TABLET | ORAL | Status: DC | PRN
Start: 2023-05-29 — End: 2023-07-06

## 2023-05-29 MED ORDER — FERROUS SULFATE 325 (65 FE) MG PO TABS
325.0000 mg | ORAL_TABLET | Freq: Every day | ORAL | 0 refills | Status: DC
  Filled 2023-05-29: qty 30, 30d supply, fill #0

## 2023-05-29 MED ORDER — LANTUS SOLOSTAR 100 UNIT/ML ~~LOC~~ SOPN
40.0000 [IU] | PEN_INJECTOR | Freq: Every day | SUBCUTANEOUS | 1 refills | Status: DC
  Filled 2023-05-29: qty 12, 30d supply, fill #0

## 2023-05-29 MED ORDER — DICLOFENAC SODIUM 1 % EX GEL: 2.0000 g | Freq: Three times a day (TID) | CUTANEOUS | Status: DC

## 2023-05-29 MED ORDER — MOMETASONE FURO-FORMOTEROL FUM 100-5 MCG/ACT IN AERO
2.0000 | INHALATION_SPRAY | Freq: Two times a day (BID) | RESPIRATORY_TRACT | 0 refills | Status: DC
  Filled 2023-05-29: qty 13, 30d supply, fill #0

## 2023-05-29 MED ORDER — ALBUTEROL SULFATE HFA 108 (90 BASE) MCG/ACT IN AERS
2.0000 | INHALATION_SPRAY | Freq: Four times a day (QID) | RESPIRATORY_TRACT | 2 refills | Status: AC | PRN
Start: 2023-05-29 — End: ?
  Filled 2023-05-29: qty 18, 30d supply, fill #0

## 2023-05-29 MED ORDER — CYCLOBENZAPRINE HCL 5 MG PO TABS
5.0000 mg | ORAL_TABLET | Freq: Three times a day (TID) | ORAL | 0 refills | Status: DC | PRN
  Filled 2023-05-29: qty 30, 10d supply, fill #0

## 2023-05-29 MED ORDER — METOPROLOL TARTRATE 25 MG PO TABS
25.0000 mg | ORAL_TABLET | Freq: Two times a day (BID) | ORAL | 0 refills | Status: DC
  Filled 2023-05-29: qty 60, 30d supply, fill #0

## 2023-05-29 MED ORDER — GABAPENTIN 400 MG PO CAPS
400.0000 mg | ORAL_CAPSULE | Freq: Three times a day (TID) | ORAL | 0 refills | Status: DC
  Filled 2023-05-29: qty 90, 30d supply, fill #0

## 2023-05-29 MED ORDER — PANTOPRAZOLE SODIUM 40 MG PO TBEC
40.0000 mg | DELAYED_RELEASE_TABLET | Freq: Every day | ORAL | 0 refills | Status: AC
  Filled 2023-05-29: qty 30, 30d supply, fill #0

## 2023-05-29 NOTE — Progress Notes (Addendum)
 Inpatient Rehabilitation Discharge Medication Review by a Pharmacist  A complete drug regimen review was completed for this patient to identify any potential clinically significant medication issues.  High Risk Drug Classes Is patient taking? Indication by Medication  Antipsychotic No   Anticoagulant No   Antibiotic No   Opioid No   Antiplatelet No   Hypoglycemics/insulin Yes Insulin - DM  Vasoactive Medication Yes Metoprolol - HTN  Chemotherapy No   Other Yes Amitriptyline - sleep Cyclobenzaprine prn spasms Gabapentin - neuropathy Pantoprazole - reflux Ubrelvy - headaches Albuterol, Dulera - SOB, asthma     Type of Medication Issue Identified Description of Issue Recommendation(s)  Drug Interaction(s) (clinically significant)     Duplicate Therapy     Allergy     No Medication Administration End Date     Incorrect Dose     Additional Drug Therapy Needed     Significant med changes from prior encounter (inform family/care partners about these prior to discharge).    Other       Clinically significant medication issues were identified that warrant physician communication and completion of prescribed/recommended actions by midnight of the next day:  No  Name of provider notified for urgent issues identified:   Provider Method of Notification:     Pharmacist comments: None  Time spent performing this drug regimen review (minutes):  20 minutes  Okey Regal, PharmD 05/29/2023 9:14 AM

## 2023-05-29 NOTE — Progress Notes (Signed)
 PROGRESS NOTE   Subjective/Complaints:  No new complaints. Really likes ASO. Ankle pain improved with it.  ROS: Patient denies fever, rash, sore throat, blurred vision, dizziness, nausea, vomiting, diarrhea, cough, shortness of breath or chest pain, joint or back/neck pain, headache, or mood change.   Objective:   No results found.  Recent Labs    05/27/23 0550 05/28/23 0513  WBC 7.9 9.8  HGB 7.8* 8.4*  HCT 23.5* 24.8*  PLT 297 293   Recent Labs    05/26/23 1329 05/27/23 0550  NA 135 136  K 4.0 4.0  CL 107 108  CO2 21* 22  GLUCOSE 227* 231*  BUN 18 20  CREATININE 0.93 1.14*  CALCIUM 8.5* 8.2*    Intake/Output Summary (Last 24 hours) at 05/29/2023 0754 Last data filed at 05/29/2023 0700 Gross per 24 hour  Intake 1624 ml  Output --  Net 1624 ml        Physical Exam: Vital Signs Blood pressure 139/80, pulse 96, temperature 98.1 F (36.7 C), resp. rate 17, height (P) 5\' 9"  (1.753 m), weight 95.1 kg, SpO2 100%.    Constitutional: No distress . Vital signs reviewed. HEENT: NCAT, EOMI, oral membranes moist Neck: supple Cardiovascular: RRR without murmur. No JVD    Respiratory/Chest: CTA Bilaterally without wheezes or rales. Normal effort    GI/Abdomen: BS +, non-tender, non-distended Ext: no clubbing, cyanosis, or edema Psych: pleasant and cooperative  GU didn't assess vaginal yeast Extremities: No clubbing, cyanosis, or edema. Pulses are 2+ Musc:mild left ankle effusion laterally. Minimal pain Skin: Clean and intact without signs of breakdown Neuro:     Alert and oriented x 3. Normal insight and awareness. Intact Memory. Normal language and speech. Cranial nerve exam unremarkable. Marland Kitchen   MOTOR: UE/LE 4+ to 5/5. Good sitting balance   SENSORY: Altered in distal feet- chronic     Assessment/Plan: 1. Functional deficits which require 3+ hours per day of interdisciplinary therapy in a comprehensive  inpatient rehab setting. Physiatrist is providing close team supervision and 24 hour management of active medical problems listed below. Physiatrist and rehab team continue to assess barriers to discharge/monitor patient progress toward functional and medical goals  Care Tool:  Bathing    Body parts bathed by patient: Right arm, Left arm, Chest, Abdomen, Front perineal area, Buttocks, Right upper leg, Left upper leg, Right lower leg, Left lower leg, Face         Bathing assist Assist Level: Independent with assistive device     Upper Body Dressing/Undressing Upper body dressing   What is the patient wearing?: Pull over shirt    Upper body assist Assist Level: Independent with assistive device    Lower Body Dressing/Undressing Lower body dressing      What is the patient wearing?: Underwear/pull up, Pants     Lower body assist Assist for lower body dressing: Independent with assitive device     Toileting Toileting    Toileting assist Assist for toileting: Independent with assistive device     Transfers Chair/bed transfer  Transfers assist     Chair/bed transfer assist level: Independent with assistive device     Locomotion Ambulation   Ambulation  assist      Assist level: Independent with assistive device Assistive device: Rollator Max distance: 500+   Walk 10 feet activity   Assist     Assist level: Independent with assistive device Assistive device: Rollator   Walk 50 feet activity   Assist    Assist level: Independent with assistive device Assistive device: Rollator    Walk 150 feet activity   Assist    Assist level: Independent with assistive device Assistive device: Rollator    Walk 10 feet on uneven surface  activity   Assist     Assist level: Independent with assistive device Assistive device: Rollator   Wheelchair     Assist Is the patient using a wheelchair?: No             Wheelchair 50 feet with 2  turns activity    Assist            Wheelchair 150 feet activity     Assist          Blood pressure 139/80, pulse 96, temperature 98.1 F (36.7 C), resp. rate 17, height (P) 5\' 9"  (1.753 m), weight 95.1 kg, SpO2 100%.  Medical Problem List and Plan: 1. Functional deficits secondary to Debility due to hypoxic respiratory failure secondary to influenza pneumonia and asthma exacerbation              -patient may shower             Dc home today. Needs follow up with primary, pulmonary, pulmonary rehab   -doesn't need follow up with [place] 2.  Antithrombotics: -DVT/anticoagulation:  pt is ambulating almost 200'   -dc'ed heparin             -antiplatelet therapy: none   3. Pain Management: Tylenol, Flexeril as needed  -Patient reports she has not needed outpatient pain medications for the past years, appears oxycodone ordered by PDMP?  -Discussed magnesium for muscle cramps, she declines due to concern of diarrhea   3/8- pain contrlled with meds    Tramadol prn for L ankle pain  3/10 reviewed xrays with patient. Perhaps mild OA but othewise unremarkable films  3/12 ASO very helpful. Continue as needed at home.    4. Mood/Behavior/Sleep: LCSW to evaluate and provide emotional support             -continue Elavil 25 mg q HS             -antipsychotic agents: n/a   5. Neuropsych/cognition: This patient is capable of making decisions on her own behalf.   6. Skin/Wound Care: Routine skin care checks             -carb modified diet             -continue MVI, B-12   7. 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:  CBGs QID; A1c = 5.0%             -continue SSI             -continue Lantus 30 units daily  -Increase Lantus to 33u daily   3/8- her Lantus was increased yesterday- still 200s will monitor trend  3/9- will increase lantus to 36 units daily- will start in AM CBG (last 3)  Recent Labs    05/28/23 1631 05/28/23 2122 05/29/23 0646  GLUCAP 198* 180* 216*   3/12 adjusted lantus to 40u daily--effective today   -she'll need outpt follow up for further mgt    10: Asthma/acute respiratory failure due to influenza -continue Brovana/Pulimcort/Yulperi nebs (consider conversion to inhalers) -continue incentive spirometry   11: GERD: continue Protonix   12: Polyneuropathy: continue gabapentin 400 mg q 8 hours   13: Acute on chronic anemia, hx of Fe deficiency; status post 4 units PRBCs                3/9- Hb 6/5- will give 2 units pRBCs'  3/12-no signs of gross blood loss   - OB sample was POSITIVE   -added Fe++ supp   -hgb up to 8.4 yesterday   -would like to recheck hgb next week. Can be thru primary or we can send her to [facility] 14: Class 1 obesity             Body mass index is 31.94 kg/m.   3/8- BMI 30.96 15: Hypokalemia and prior hyerkalemia: follow-up BMP  -CMP pending   3/10- k+ 4.0 16: AKI:  likely due to ATN, 3.02 --->1.22, now rising BUN/Cr back up to 62/1.47 on  3/4             -CMP results pending   3/10 bun/cr 20/1.14 17. Oropharyngeal dysphagia:             -upgraded to Carb mod/thin diet, continue SLP   18. Vaginal candidiasis  3/9- will give Diflucan 150 mg x1    LOS: 6 days A FACE TO FACE EVALUATION WAS PERFORMED  Ranelle Oyster 05/29/2023, 7:54 AM

## 2023-05-29 NOTE — Discharge Summary (Signed)
 Physician Discharge Summary  Patient ID: CELENA LANIUS MRN: [id number] DOB/AGE: June 08, 1980 43 y.o.  Admit date: 05/23/2023 Discharge date: 05/29/2023  Discharge Diagnoses:  Principal Problem:   Debility Active Problems:   Respiratory failure (HCC) Debility due to hypoxic respiratory failure secondary to influenza pneumonia and asthma exacerbation  DM-2 Diabetic neuropathy GERD Acute on chronic anemia Class 1 obesity Hypokalemia AKI Oropharyngeal dysphagia Hypertension  Discharged Condition: stable  Significant Diagnostic Studies:  DG Ankle Complete Left Result Date: 05/26/2023 CLINICAL DATA:  [id number] Acute left ankle pain [id number] EXAM: LEFT ANKLE COMPLETE - 3+ VIEW COMPARISON:  March 25, 2023 FINDINGS: Osteopenia. No acute fracture or dislocation. Joint spaces and alignment are maintained. No area of erosion or osseous destruction. No unexpected radiopaque foreign body. Soft tissues are unremarkable. IMPRESSION: No acute fracture or dislocation. Electronically Signed   By: Meda Klinefelter M.D.   On: 05/26/2023 12:32   Labs:  Basic Metabolic Panel: Recent Labs  Lab 05/24/23 1114 05/26/23 1329 05/27/23 0550  NA 137 135 136  K 3.8 4.0 4.0  CL 106 107 108  CO2 21* 21* 22  GLUCOSE 199* 227* 231*  BUN 26* 18 20  CREATININE 1.22* 0.93 1.14*  CALCIUM 8.5* 8.5* 8.2*    CBC: Recent Labs  Lab 05/24/23 1114 05/25/23 0849 05/26/23 1329 05/27/23 0040 05/27/23 0550 05/28/23 0513  WBC 10.4  --  8.0 8.6 7.9 9.8  NEUTROABS 7.7  --  5.4  --   --   --   HGB 7.2*   < > 6.5* 8.0* 7.8* 8.4*  HCT 21.5*   < > 19.6* 24.0* 23.5* 24.8*  MCV 81.1  --  82.7 81.6 81.9 81.0  PLT 327  --  303 288 297 293   < > = values in this interval not displayed.    CBG: Recent Labs  Lab 05/28/23 0627 05/28/23 1151 05/28/23 1631 05/28/23 2122 05/29/23 0646  GLUCAP 158* 128* 198* 180* 216*    Brief HPI:   Shandrell L Novosad is a 43 y.o. female who presented to [facility] ED on  05/11/2023 with generalized weakness, abdominal pain, orthopnea and BLE swelling. Her past medical history significant for asthma, iron deficiency anemia due to menorrhagia, diabetes mellitus, polyneuropathy, tobacco use, arthritis. Laboratory workup revealed serum potassium of 5.5. BUN was 17 and creatinine 1.04 on admission. Her hemoglobin was 6.9 and repeat value was 6.4 and 6.2. Review of laboratory results dated 04/22/2023 revealed a normal iron/anemia profile. She continued to have downward trends in H&H and was transfused a total of 4 units PRBCs over the course of her admission. Her respiratory status deteriorated and pulmonary medicine was consulted. She reported having sick contact and her son who was diagnosed with influenza. Her influenza A was positive and she was started on ceftriaxone, azithromycin and Solu-Medrol. She was placed on BiPAP, however she remained tachypneic and was transferred to the ICU. Repeat chest x-ray revealed worsening bilateral pulmonary opacities. Consented to CVC and A-line for ARDS management. She was intubated on 2/23. She was started on Tamiflu and completed a 7-day course. She was extubated on 2/28 and required BiPAP for 24 hours. She was treated with nebs and inhaled therapy for asthma. She was agitated and restless after extubation, treated for ICU delirium. She is on droplet precautions. Core track was placed for dysphagia and tube feedings and removed on 3/04. She is tolerating oral intake. Followed by SLP for dysphagia, upgraded to thin liquids 3/06. Other workup includes CTA of the chest  negative for PE but positive for multifocal pneumonia, MRI brain negative, quadrant ultrasound with chronic hepatomegaly otherwise unremarkable. 2D echo with EF 65 to 70%. CT of the head without acute findings. Respiratory virus panel, sputum culture, blood culture and tracheal aspirate cultures all negative. PT OT and speech evaluations carried out. She is progressing towards goals and  is able to ambulate hallway distance with close chair following rolling walker.    Hospital Course: JONNELLE LAWNICZAK was admitted to rehab 05/23/2023 for inpatient therapies to consist of PT, ST and OT at least three hours five days a week. Past admission physiatrist, therapy team and rehab RN have worked together to provide customized collaborative inpatient rehab. BUN/Cr improved on admission. Diet upgraded to carb modified. Treated with diflucan times one dose for vaginal candida symptoms. Left ankle pain reported and x-ray obtained>>nothing acute. Added Voltaren gel. ASO ordered for left ankle. This improved ankle pain well. Hgb improved to 8.4 on 3/11.   We discussed the need to follow-up plans for uterine ultrasound and to keep appointment for labs and follow-up with Dr. Terrace Arabia. Attempted to make arrangements for pulmonology rehab; however as an APP, I cannot place this order. Encouraged her to follow-up with PCP and with Dr. Jayme Cloud in order to get assessed for pulm rehab and for further management of lung disease/recovery. Started albuterol and Dulera inhalers at discharge.  Blood pressures were monitored on TID basis and Lopressor 25 mg BID continued.   Diabetes has been monitored with ac/hs CBG checks and SSI was use prn for tighter BS control. Lantus 30 units every day continued. Increased to 33 units on 3/08. Increased further to 36 units on 3/09.  Rehab course: During patient's stay in rehab weekly team conferences were held to monitor patient's progress, set goals and discuss barriers to discharge. At admission, patient required supervision with mobility and min with basic self-care skills   She  has had improvement in activity tolerance, balance, postural control as well as ability to compensate for deficits. She has had improvement in functional use RUE/LUE  and RLE/LLE as well as improvement in awareness. Patient has met 9 of 9 long term goals due to improved activity tolerance,  improved balance, and ability to compensate for deficits.  Patient to discharge at overall Modified Independent level for ADLs and simple IADLs.    Attempted to make arrangements for pulmonology rehab; however as an APP, I cannot place this order. Encouraged her to follow-up with PCP and with Dr. Jayme Cloud in order to get assessed for pulm rehab and for further management of lung disease/recovery.    Disposition: Discharge disposition: 01-Home or Self Care     Diet: carb modified  Special Instructions: No driving, alcohol consumption or tobacco use.  Follow-up with PCP for CBC in one week and with [facility] to assess for possible pulmonary rehabilitation.  Allergies as of 05/29/2023       Reactions   Trulicity [dulaglutide] Diarrhea        Medication List     STOP taking these medications    arformoterol 15 MCG/2ML Nebu Commonly known as: BROVANA   budesonide 0.5 MG/2ML nebulizer solution Commonly known as: PULMICORT   docusate sodium 100 MG capsule Commonly known as: COLACE   methocarbamol 500 MG tablet Commonly known as: ROBAXIN   NovoLOG FlexPen 100 UNIT/ML FlexPen Generic drug: insulin aspart   ondansetron 4 MG disintegrating tablet Commonly known as: ZOFRAN-ODT   oxyCODONE-acetaminophen 5-325 MG tablet Commonly known as: PERCOCET/ROXICET  TAKE these medications    acetaminophen 325 MG tablet Commonly known as: TYLENOL Take 1-2 tablets (325-650 mg total) by mouth every 4 (four) hours as needed for mild pain (pain score 1-3). What changed:  medication strength how much to take when to take this reasons to take this   albuterol 108 (90 Base) MCG/ACT inhaler Commonly known as: VENTOLIN HFA Inhale 2 puffs into the lungs every 6 (six) hours as needed for wheezing or shortness of breath.   amitriptyline 25 MG tablet Commonly known as: ELAVIL Take 1 tablet (25 mg total) by mouth at bedtime.   cholecalciferol 25 MCG (1000 UNIT)  tablet Commonly known as: VITAMIN D3 Take 1,000 Units by mouth daily.   cyclobenzaprine 5 MG tablet Commonly known as: FLEXERIL Take 1 tablet (5 mg total) by mouth 3 (three) times daily as needed for muscle spasms.   diclofenac Sodium 1 % Gel Commonly known as: VOLTAREN Apply 2 g topically 3 (three) times daily.   ferrous sulfate 325 (65 FE) MG tablet Take 1 tablet (325 mg total) by mouth daily with breakfast.   gabapentin 400 MG capsule Commonly known as: NEURONTIN Take 1 capsule (400 mg total) by mouth every 8 (eight) hours.   Lantus SoloStar 100 UNIT/ML Solostar Pen Generic drug: insulin glargine Inject 40 Units into the skin daily before breakfast. What changed:  how much to take when to take this   metoprolol tartrate 25 MG tablet Commonly known as: LOPRESSOR Take 1 tablet (25 mg total) by mouth 2 (two) times daily.   mometasone-formoterol 100-5 MCG/ACT Aero Commonly known as: DULERA Inhale 2 puffs into the lungs 2 (two) times daily at 10 AM and 5 PM.   multivitamin with minerals Tabs tablet Take 1 tablet by mouth daily.   pantoprazole 40 MG tablet Commonly known as: PROTONIX Take 1 tablet (40 mg total) by mouth daily.   Ubrelvy 100 MG Tabs Generic drug: Ubrogepant Take 1 tablet (100 mg total) by mouth as needed. Take 1 tablet at onset of headache, may repeat in 2 hours if needed. Max is 200 mg in 24 hours.   Vitamin B-12 2500 MCG Subl Place 1 tablet (2,500 mcg total) under the tongue daily.   vitamin C 250 MG tablet Commonly known as: ASCORBIC ACID Take 2 tablets (500 mg total) by mouth daily.        Follow-up Information     Anselm Jungling, NP Follow up.   Specialty: Nurse Practitioner Why: Call the office in 1-2 days to make arrangements for hospital follow-up appointment. You need labs for a complete blood count to be done in ONE WEEK. Contact information: [address] [address] [place] [address] [phone]         Fanny Dance, MD Follow  up.   Specialty: Physical Medicine and Rehabilitation Why: As needed Contact information: [address] [place] [address] [phone]         Salena Saner, MD Follow up.   Specialty: Pulmonary Disease Why: Call the office in 1-2 days to make arrangements for hospital follow-up appointment. They will assess you for need for pulmonolgy rehabilitation. Contact information: [address] [place] [address] [phone]                 Signed: Milinda Antis 05/29/2023, 10:35 AM

## 2023-05-30 LAB — BPAM RBC
Blood Product Expiration Date: 202503292359
Blood Product Expiration Date: 202503292359
Blood Product Expiration Date: 202504062359
ISSUE DATE / TIME: 202503091627
ISSUE DATE / TIME: 202503091835
Unit Type and Rh: 6200
Unit Type and Rh: 6200
Unit Type and Rh: 6200

## 2023-05-30 LAB — TYPE AND SCREEN
ABO/RH(D): A POS
Antibody Screen: NEGATIVE
Unit division: 0
Unit division: 0
Unit division: 0

## 2023-05-30 NOTE — Plan of Care (Signed)
  Problem: RH Balance Goal: LTG Patient will maintain dynamic standing with ADLs (OT) Description: LTG:  Patient will maintain dynamic standing balance with assist during activities of daily living (OT)  Outcome: Completed/Met   Problem: RH Grooming Goal: LTG Patient will perform grooming w/assist,cues/equip (OT) Description: LTG: Patient will perform grooming with assist, with/without cues using equipment (OT) Outcome: Completed/Met   Problem: RH Bathing Goal: LTG Patient will bathe all body parts with assist levels (OT) Description: LTG: Patient will bathe all body parts with assist levels (OT) Outcome: Completed/Met   Problem: RH Dressing Goal: LTG Patient will perform upper body dressing (OT) Description: LTG Patient will perform upper body dressing with assist, with/without cues (OT). Outcome: Completed/Met Goal: LTG Patient will perform lower body dressing w/assist (OT) Description: LTG: Patient will perform lower body dressing with assist, with/without cues in positioning using equipment (OT) Outcome: Completed/Met   Problem: RH Toileting Goal: LTG Patient will perform toileting task (3/3 steps) with assistance level (OT) Description: LTG: Patient will perform toileting task (3/3 steps) with assistance level (OT)  Outcome: Completed/Met   Problem: RH Simple Meal Prep Goal: LTG Patient will perform simple meal prep w/assist (OT) Description: LTG: Patient will perform simple meal prep with assistance, with/without cues (OT). Outcome: Completed/Met   Problem: RH Toilet Transfers Goal: LTG Patient will perform toilet transfers w/assist (OT) Description: LTG: Patient will perform toilet transfers with assist, with/without cues using equipment (OT) Outcome: Completed/Met   Problem: RH Tub/Shower Transfers Goal: LTG Patient will perform tub/shower transfers w/assist (OT) Description: LTG: Patient will perform tub/shower transfers with assist, with/without cues using equipment  (OT) Outcome: Completed/Met

## 2023-05-30 NOTE — Progress Notes (Signed)
 Occupational Therapy Discharge Summary  Patient Details  Name: Melinda Stone MRN: [id number] Date of Birth: Aug 27, 1980  Date of Discharge from OT service:May 28, 2023   Patient has met 9 of 9 long term goals due to improved activity tolerance, improved balance, and ability to compensate for deficits.  Patient to discharge at overall Modified Independent level for ADLs and simple IADLs.   Reasons goals not met: NA  Recommendation:  No OT follow-up recommended at this time.   Equipment: Shower chair with back & rollator  Reasons for discharge: treatment goals met and discharge from hospital  Patient/family agrees with progress made and goals achieved: Yes  OT Discharge Precautions/Restrictions  Precautions Precautions: Fall Restrictions Weight Bearing Restrictions Per Provider Order: No ADL ADL Eating: Independent Where Assessed-Eating: Edge of bed Grooming: Independent Where Assessed-Grooming: Sitting at sink, Standing at sink Upper Body Bathing: Modified independent Where Assessed-Upper Body Bathing: Shower Lower Body Bathing: Modified independent Where Assessed-Lower Body Bathing: Shower Upper Body Dressing: Independent Where Assessed-Upper Body Dressing: Edge of bed Lower Body Dressing: Modified independent Where Assessed-Lower Body Dressing: Edge of bed Toileting: Modified independent Where Assessed-Toileting: Neurosurgeon Method: Proofreader: Other (comment) Aeronautical engineer) Tub/Shower Transfer: Modified independent Web designer Method: Ship broker: Information systems manager with back Film/video editor: Modified independent Film/video editor Method: Designer, industrial/product: Grab bars, Sales promotion account executive Baseline Vision/History: 1 Wears glasses Patient Visual Report: No change from baseline Vision Assessment?: No apparent visual  deficits Perception  Perception: Within Functional Limits Praxis Praxis: WFL Cognition Cognition Overall Cognitive Status: Within Functional Limits for tasks assessed Arousal/Alertness: Awake/alert Orientation Level: Person;Place;Situation Memory: Appears intact Awareness: Appears intact Problem Solving: Appears intact Reasoning: Appears intact Sequencing: Appears intact Organizing: Appears intact Decision Making: Appears intact Safety/Judgment: Appears intact Sensation Sensation Light Touch: Impaired by gross assessment Hot/Cold: Appears Intact Proprioception: Appears Intact Stereognosis: Appears Intact Additional Comments: limited 2/2 B LE neuroapthy Coordination Gross Motor Movements are Fluid and Coordinated: No Fine Motor Movements are Fluid and Coordinated: Yes Coordination and Movement Description: Generalized debility Motor  Motor Motor: Other (comment) Motor - Skilled Clinical Observations: Generalized weakness and deconditioning Mobility  Bed Mobility Bed Mobility: Sit to Supine;Supine to Sit;Rolling Left;Rolling Right Rolling Right: Independent Rolling Left: Independent Supine to Sit: Independent Sit to Supine: Independent Transfers Sit to Stand: Independent with assistive device Stand to Sit: Independent with assistive device  Trunk/Postural Assessment  Cervical Assessment Cervical Assessment: Exceptions to [facility] (forward head) Thoracic Assessment Thoracic Assessment: Within Functional Limits Lumbar Assessment Lumbar Assessment: Exceptions to [facility] (chronic low back pain precautions) Postural Control Postural Control: Within Functional Limits  Balance Balance Balance Assessed: Yes Static Sitting Balance Static Sitting - Balance Support: Feet supported;No upper extremity supported Static Sitting - Level of Assistance: 7: Independent Dynamic Sitting Balance Dynamic Sitting - Balance Support: Feet supported;No upper extremity supported Dynamic Sitting  - Level of Assistance: 6: Modified independent (Device/Increase time) Static Standing Balance Static Standing - Balance Support: Bilateral upper extremity supported;During functional activity Static Standing - Level of Assistance: 7: Independent Dynamic Standing Balance Dynamic Standing - Balance Support: During functional activity;Bilateral upper extremity supported Dynamic Standing - Level of Assistance: 6: Modified independent (Device/Increase time) Extremity/Trunk Assessment RUE Assessment RUE Assessment: Within Functional Limits General Strength Comments: grip: 55# LUE Assessment LUE Assessment: Within Functional Limits General Strength Comments: grip: 55#   Lou Cal, OTR/L, MSOT  05/30/2023, 5:39 AM

## 2023-06-11 ENCOUNTER — Telehealth: Payer: Self-pay

## 2023-06-11 ENCOUNTER — Other Ambulatory Visit: Payer: Self-pay

## 2023-06-11 ENCOUNTER — Inpatient Hospital Stay

## 2023-06-11 ENCOUNTER — Emergency Department (HOSPITAL_COMMUNITY)
Admission: EM | Admit: 2023-06-11 | Discharge: 2023-06-11 | Disposition: A | Discharge status: Home / Self Care | Attending: Emergency Medicine | Admitting: Emergency Medicine

## 2023-06-11 DIAGNOSIS — D649 Anemia, unspecified: Secondary | ICD-10-CM | POA: Insufficient documentation

## 2023-06-11 DIAGNOSIS — F17290 Nicotine dependence, other tobacco product, uncomplicated: Secondary | ICD-10-CM | POA: Diagnosis not present

## 2023-06-11 DIAGNOSIS — N939 Abnormal uterine and vaginal bleeding, unspecified: Secondary | ICD-10-CM | POA: Diagnosis not present

## 2023-06-11 DIAGNOSIS — Z794 Long term (current) use of insulin: Secondary | ICD-10-CM | POA: Insufficient documentation

## 2023-06-11 DIAGNOSIS — R799 Abnormal finding of blood chemistry, unspecified: Secondary | ICD-10-CM | POA: Diagnosis present

## 2023-06-11 DIAGNOSIS — D508 Other iron deficiency anemias: Secondary | ICD-10-CM

## 2023-06-11 DIAGNOSIS — E538 Deficiency of other specified B group vitamins: Secondary | ICD-10-CM | POA: Diagnosis not present

## 2023-06-11 DIAGNOSIS — D509 Iron deficiency anemia, unspecified: Secondary | ICD-10-CM | POA: Diagnosis present

## 2023-06-11 DIAGNOSIS — R160 Hepatomegaly, not elsewhere classified: Secondary | ICD-10-CM | POA: Diagnosis not present

## 2023-06-11 LAB — CBC
HCT: 20.2 % — ABNORMAL LOW (ref 36.0–46.0)
Hemoglobin: 6.8 g/dL — CL (ref 12.0–15.0)
MCH: 27.2 pg (ref 26.0–34.0)
MCHC: 33.7 g/dL (ref 30.0–36.0)
MCV: 80.8 fL (ref 80.0–100.0)
Platelets: 110 10*3/uL — ABNORMAL LOW (ref 150–400)
RBC: 2.5 MIL/uL — ABNORMAL LOW (ref 3.87–5.11)
RDW: 19.3 % — ABNORMAL HIGH (ref 11.5–15.5)
WBC: 8.4 10*3/uL (ref 4.0–10.5)
nRBC: 0 % (ref 0.0–0.2)

## 2023-06-11 LAB — CBC WITH DIFFERENTIAL (CANCER CENTER ONLY)
Abs Immature Granulocytes: 0.04 10*3/uL (ref 0.00–0.07)
Basophils Absolute: 0 10*3/uL (ref 0.0–0.1)
Basophils Relative: 0 %
Eosinophils Absolute: 0.2 10*3/uL (ref 0.0–0.5)
Eosinophils Relative: 2 %
HCT: 19.3 % — ABNORMAL LOW (ref 36.0–46.0)
Hemoglobin: 6.5 g/dL — CL (ref 12.0–15.0)
Immature Granulocytes: 1 %
Lymphocytes Relative: 20 %
Lymphs Abs: 1.5 10*3/uL (ref 0.7–4.0)
MCH: 27.2 pg (ref 26.0–34.0)
MCHC: 33.7 g/dL (ref 30.0–36.0)
MCV: 80.8 fL (ref 80.0–100.0)
Monocytes Absolute: 0.4 10*3/uL (ref 0.1–1.0)
Monocytes Relative: 5 %
Neutro Abs: 5.5 10*3/uL (ref 1.7–7.7)
Neutrophils Relative %: 72 %
Platelet Count: 109 10*3/uL — ABNORMAL LOW (ref 150–400)
RBC: 2.39 MIL/uL — ABNORMAL LOW (ref 3.87–5.11)
RDW: 19.5 % — ABNORMAL HIGH (ref 11.5–15.5)
WBC Count: 7.6 10*3/uL (ref 4.0–10.5)
nRBC: 0 % (ref 0.0–0.2)

## 2023-06-11 LAB — TECHNOLOGIST SMEAR REVIEW: Plt Morphology: NORMAL

## 2023-06-11 LAB — POC OCCULT BLOOD, ED: Fecal Occult Bld: NEGATIVE

## 2023-06-11 LAB — RETIC PANEL
Immature Retic Fract: 9.1 % (ref 2.3–15.9)
RBC.: 2.25 MIL/uL — ABNORMAL LOW (ref 3.87–5.11)
Retic Count, Absolute: 267 10*3/uL — ABNORMAL HIGH (ref 19.0–186.0)
Retic Ct Pct: 11.9 % — ABNORMAL HIGH (ref 0.4–3.1)
Reticulocyte Hemoglobin: 31.7 pg (ref >27.9)

## 2023-06-11 LAB — COMPREHENSIVE METABOLIC PANEL
ALT: 13 U/L (ref 0–44)
AST: 15 U/L (ref 15–41)
Albumin: 2.9 g/dL — ABNORMAL LOW (ref 3.5–5.0)
Alkaline Phosphatase: 90 U/L (ref 38–126)
Anion gap: 7 (ref 5–15)
BUN: 20 mg/dL (ref 6–20)
CO2: 21 mmol/L — ABNORMAL LOW (ref 22–32)
Calcium: 8.1 mg/dL — ABNORMAL LOW (ref 8.9–10.3)
Chloride: 105 mmol/L (ref 98–111)
Creatinine, Ser: 1.05 mg/dL — ABNORMAL HIGH (ref 0.44–1.00)
GFR, Estimated: >60 mL/min (ref >60)
Glucose, Bld: 340 mg/dL — ABNORMAL HIGH (ref 70–99)
Potassium: 4.4 mmol/L (ref 3.5–5.1)
Sodium: 133 mmol/L — ABNORMAL LOW (ref 135–145)
Total Bilirubin: 1 mg/dL (ref 0.0–1.2)
Total Protein: 5.8 g/dL — ABNORMAL LOW (ref 6.5–8.1)

## 2023-06-11 LAB — HCG, SERUM, QUALITATIVE: Preg, Serum: NEGATIVE

## 2023-06-11 LAB — IRON AND TIBC
Iron: 131 ug/dL (ref 28–170)
Saturation Ratios: 41 % — ABNORMAL HIGH (ref 10.4–31.8)
TIBC: 322 ug/dL (ref 250–450)
UIBC: 191 ug/dL

## 2023-06-11 LAB — FERRITIN: Ferritin: 174 ng/mL (ref 11–307)

## 2023-06-11 LAB — VITAMIN B12: Vitamin B-12: 604 pg/mL (ref 180–914)

## 2023-06-11 LAB — PREPARE RBC (CROSSMATCH)

## 2023-06-11 MED ORDER — SODIUM CHLORIDE 0.9% IV SOLUTION: Freq: Once | INTRAVENOUS | Status: AC

## 2023-06-11 NOTE — ED Provider Notes (Signed)
 [place] [facility]GENCY DEPARTMENT AT [facility] Provider Note   CSN: [id number] Arrival date & time: 06/11/23  1421    History  Chief Complaint  Patient presents with   Abnormal Lab    Hgb <7    Melinda Stone is a 43 y.o. female here for evaluation of abnormal lab.  Patient states she has heavy menstrual cycles. Recently had a prolonged hospitalization for influenza, ARDS requiring intubation and inpatient rehab.  She has a longstanding history of anemia gets iron transfusions.  Discharge about 2 weeks ago.  She did have heavy vaginal bleeding as well as dark stool she was hospitalized and required transfusions.  She had her labs checked college he noted to have worsening of her chronic anemia.  She states she actually feels good.  She states typically when she is anemic and needs a transfusion she gets headaches, lightheadedness and shortness of breath.  She does not have any of these.  She did states she had a "hard" bowel movement earlier today which was darker than normal.  She denies any gross melena or bright blood per rectum.  No chest pain, shortness of breath, abdominal pain.  No chronic NSAID use or EtOH use.  HPI     Home Medications Prior to Admission medications   Medication Sig Start Date End Date Taking? Authorizing Provider  acetaminophen (TYLENOL) 325 MG tablet Take 1-2 tablets (325-650 mg total) by mouth every 4 (four) hours as needed for mild pain (pain score 1-3). 05/29/23   Setzer, Lynnell Jude, PA-C  albuterol (VENTOLIN HFA) 108 (90 Base) MCG/ACT inhaler Inhale 2 puffs into the lungs every 6 (six) hours as needed for wheezing or shortness of breath. 05/29/23   Setzer, Lynnell Jude, PA-C  amitriptyline (ELAVIL) 25 MG tablet Take 1 tablet (25 mg total) by mouth at bedtime. 01/31/23   Glean Salvo, NP  cholecalciferol (VITAMIN D3) 25 MCG (1000 UNIT) tablet Take 1,000 Units by mouth daily.    [provider]  Cyanocobalamin (VITAMIN B-12) 2500 MCG SUBL  Place 1 tablet (2,500 mcg total) under the tongue daily. 04/23/23   Rickard Patience, MD  cyclobenzaprine (FLEXERIL) 5 MG tablet Take 1 tablet (5 mg total) by mouth 3 (three) times daily as needed for muscle spasms. 05/29/23   Setzer, Lynnell Jude, PA-C  diclofenac Sodium (VOLTAREN) 1 % GEL Apply 2 g topically 3 (three) times daily. 05/29/23   Setzer, Lynnell Jude, PA-C  ferrous sulfate 325 (65 FE) MG tablet Take 1 tablet (325 mg total) by mouth daily with breakfast. 05/29/23   Setzer, Lynnell Jude, PA-C  gabapentin (NEURONTIN) 400 MG capsule Take 1 capsule (400 mg total) by mouth every 8 (eight) hours. 05/29/23   Setzer, Lynnell Jude, PA-C  insulin glargine (LANTUS SOLOSTAR) 100 UNIT/ML Solostar Pen Inject 40 Units into the skin daily before breakfast. 05/29/23   Setzer, Lynnell Jude, PA-C  metoprolol tartrate (LOPRESSOR) 25 MG tablet Take 1 tablet (25 mg total) by mouth 2 (two) times daily. 05/29/23 06/28/23  Setzer, Lynnell Jude, PA-C  mometasone-formoterol (DULERA) 100-5 MCG/ACT AERO Inhale 2 puffs into the lungs 2 (two) times daily at 10 AM and 5 PM. 05/29/23   Setzer, Lynnell Jude, PA-C  Multiple Vitamin (MULTIVITAMIN WITH MINERALS) TABS tablet Take 1 tablet by mouth daily. 05/29/23   Setzer, Lynnell Jude, PA-C  pantoprazole (PROTONIX) 40 MG tablet Take 1 tablet (40 mg total) by mouth daily. 05/29/23   Setzer, Lynnell Jude, PA-C  Ubrogepant (UBRELVY) 100 MG TABS  Take 1 tablet (100 mg total) by mouth as needed. Take 1 tablet at onset of headache, may repeat in 2 hours if needed. Max is 200 mg in 24 hours. 01/31/23   Glean Salvo, NP  vitamin C (ASCORBIC ACID) 250 MG tablet Take 2 tablets (500 mg total) by mouth daily. Patient taking differently: Take 250 mg by mouth daily. 04/10/22   Rickard Patience, MD      Allergies    Trulicity [dulaglutide]    Review of Systems   Review of Systems  Constitutional: Negative.   HENT: Negative.    Respiratory: Negative.    Cardiovascular: Negative.   Gastrointestinal: Negative.   Genitourinary: Negative.    Musculoskeletal: Negative.   Skin: Negative.   Neurological: Negative.   All other systems reviewed and are negative.   Physical Exam Updated Vital Signs BP (!) 172/86   Pulse 97   Temp 98.6 F (37 C)   Resp (!) 21   Ht 5\' 9"  (1.753 m)   Wt 98.9 kg   LMP 05/13/2023   SpO2 100%   BMI 32.19 kg/m  Physical Exam Vitals and nursing note reviewed.  Constitutional:      General: She is not in acute distress.    Appearance: She is well-developed. She is not ill-appearing, toxic-appearing or diaphoretic.  HENT:     Head: Atraumatic.     Nose: Nose normal.     Mouth/Throat:     Mouth: Mucous membranes are moist.  Eyes:     Pupils: Pupils are equal, round, and reactive to light.  Cardiovascular:     Rate and Rhythm: Normal rate.     Pulses: Normal pulses.     Heart sounds: Normal heart sounds.  Pulmonary:     Effort: Pulmonary effort is normal. No respiratory distress.     Breath sounds: Normal breath sounds.  Abdominal:     General: Bowel sounds are normal. There is no distension.     Palpations: Abdomen is soft.     Tenderness: There is no abdominal tenderness. There is no right CVA tenderness, left CVA tenderness, guarding or rebound.  Genitourinary:    Comments: Rectal exam with paramedic Krista in the room.  Light brown stool in rectal vault.  No gross blood or melena. Musculoskeletal:        General: Normal range of motion.     Cervical back: Normal range of motion.     Right lower leg: No edema.     Left lower leg: No edema.  Skin:    General: Skin is warm and dry.     Capillary Refill: Capillary refill takes less than 2 seconds.  Neurological:     General: No focal deficit present.     Mental Status: She is alert.  Psychiatric:        Mood and Affect: Mood normal.     ED Results / Procedures / Treatments   Labs (all labs ordered are listed, but only abnormal results are displayed) Labs Reviewed  COMPREHENSIVE METABOLIC PANEL - Abnormal; Notable for the  following components:      Result Value   Sodium 133 (*)    CO2 21 (*)    Glucose, Bld 340 (*)    Creatinine, Ser 1.05 (*)    Calcium 8.1 (*)    Total Protein 5.8 (*)    Albumin 2.9 (*)    All other components within normal limits  CBC - Abnormal; Notable for the following components:  RBC 2.50 (*)    Hemoglobin 6.8 (*)    HCT 20.2 (*)    RDW 19.3 (*)    Platelets 110 (*)    All other components within normal limits  HCG, SERUM, QUALITATIVE  POC OCCULT BLOOD, ED  TYPE AND SCREEN  PREPARE RBC (CROSSMATCH)    EKG None  Radiology No results found.  Procedures .Critical Care  Performed by: Linwood Dibbles, PA-C Authorized by: Linwood Dibbles, PA-C   Critical care provider statement:    Critical care time (minutes):  35   Critical care was necessary to treat or prevent imminent or life-threatening deterioration of the following conditions:  Circulatory failure   Critical care was time spent personally by me on the following activities:  Development of treatment plan with patient or surrogate, discussions with consultants, evaluation of patient's response to treatment, examination of patient, ordering and review of laboratory studies, ordering and review of radiographic studies, ordering and performing treatments and interventions, pulse oximetry, re-evaluation of patient's condition and review of old charts     Medications Ordered in ED Medications  0.9 %  sodium chloride infusion (Manually program via Guardrails IV Fluids) (0 mLs Intravenous Stopped 06/11/23 1928)    ED Course/ Medical Decision Making/ A&P   43 year old here for evaluation of abnormal labs.  Noted to have worsening acute on chronic anemia.  She has a history of similar due to heavy menstrual cycle and iron deficiency anemia.  Patient recently discharged 2 weeks ago after prolonged hospitalization for ARDS requiring intubation.  She states she actually feels good.  She is asymptomatic with regards to  her anemia.  She did note she had a darker than normal stool earlier today however no gross melena or bright red per rectum.  Will plan on occult, labs, reassess  Labs personally viewed and interpreted:  Occult negative--light brown stool in rectal vault Pregnancy test negative Metabolic panel without significant abnormality, BUN 20 CBC without leukocytosis, hemoglobin 6.8  Discussed results with patient.  Likely due to heavy menstrual cycles.  She is currently asymptomatic.  Will transfuse 1 unit and dc/ Discussed with attending.   Patient reassessed.  She continues to remain asymptomatic.  She is comfortable plan to discharge home.  She likely has acute on chronic anemia.  I recommend touching base with her hematologist.  She has no active bleeding at this time.  The patient has been appropriately medically screened and/or stabilized in the ED. I have low suspicion for any other emergent medical condition which would require further screening, evaluation or treatment in the ED or require inpatient management.  Patient is hemodynamically stable and in no acute distress.  Patient able to ambulate in department prior to ED.  Evaluation does not show acute pathology that would require ongoing or additional emergent interventions while in the emergency department or further inpatient treatment.  I have discussed the diagnosis with the patient and answered all questions.  Pain is been managed while in the emergency department and patient has no further complaints prior to discharge.  Patient is comfortable with plan discussed in room and is stable for discharge at this time.  I have discussed strict return precautions for returning to the emergency department.  Patient was encouraged to follow-up with PCP/specialist refer to at discharge.  Medical Decision Making Amount and/or Complexity of Data Reviewed External Data Reviewed: labs, radiology and notes. Labs:  ordered. Decision-making details documented in ED Course.  Risk OTC drugs. Prescription drug management. Decision regarding hospitalization. Diagnosis or treatment significantly limited by social determinants of health.          Final Clinical Impression(s) / ED Diagnoses Final diagnoses:  Symptomatic anemia    Rx / DC Orders ED Discharge Orders     None         Nyzir Dubois A, PA-C 06/11/23 1950    Gwyneth Sprout, MD 06/15/23 2225

## 2023-06-11 NOTE — ED Notes (Signed)
 This paramedic sat with patient for first 15 minutes of transfusion. Pt tolerating well.

## 2023-06-11 NOTE — Discharge Instructions (Addendum)
 You are given a liter of blood here.  I would make sure to follow-up with your hematologist and let them know you were seen.  Return for any worsening symptoms

## 2023-06-11 NOTE — Telephone Encounter (Signed)
 Received critical lab value from [facility].   Hemoglobin 6.5  Elouise Munroe, NP notified and recommends ER for blood transfusion. Called pt and notified her, she verbalized understanding.

## 2023-06-11 NOTE — ED Triage Notes (Signed)
 Pt had routine labs drawn and was told to come in for HGB less than 7. Pt feeling better than she usually does when hgb is low. Denies symptoms at this time. Pt having dark tarry stools and fecal occult was positive last time she was here.

## 2023-06-12 ENCOUNTER — Ambulatory Visit: Payer: Medicaid Other | Admitting: Primary Care

## 2023-06-12 LAB — TYPE AND SCREEN
ABO/RH(D): A POS
Antibody Screen: NEGATIVE
Unit division: 0

## 2023-06-12 LAB — BPAM RBC
Blood Product Expiration Date: 202504192359
ISSUE DATE / TIME: 202503251714
Unit Type and Rh: 6200

## 2023-06-13 ENCOUNTER — Telehealth: Payer: Self-pay

## 2023-06-13 NOTE — Telephone Encounter (Signed)
-----   Message from Rickard Patience sent at 06/12/2023 10:02 PM EDT ----- Please move her appt earlier to this week if possible. She got 1 unit of PRBC in ER.

## 2023-06-13 NOTE — Telephone Encounter (Signed)
 Please move appts to this week and notify pt of new appt details.

## 2023-06-14 ENCOUNTER — Encounter: Payer: Self-pay | Admitting: Oncology

## 2023-06-14 ENCOUNTER — Inpatient Hospital Stay (HOSPITAL_BASED_OUTPATIENT_CLINIC_OR_DEPARTMENT_OTHER): Admitting: Oncology

## 2023-06-14 ENCOUNTER — Inpatient Hospital Stay

## 2023-06-14 VITALS — BP 169/65 | HR 89 | Temp 96.8°F | Resp 18 | Wt 218.9 lb

## 2023-06-14 DIAGNOSIS — D509 Iron deficiency anemia, unspecified: Secondary | ICD-10-CM | POA: Insufficient documentation

## 2023-06-14 DIAGNOSIS — D649 Anemia, unspecified: Secondary | ICD-10-CM | POA: Diagnosis not present

## 2023-06-14 DIAGNOSIS — R160 Hepatomegaly, not elsewhere classified: Secondary | ICD-10-CM

## 2023-06-14 DIAGNOSIS — E538 Deficiency of other specified B group vitamins: Secondary | ICD-10-CM | POA: Diagnosis not present

## 2023-06-14 DIAGNOSIS — D508 Other iron deficiency anemias: Secondary | ICD-10-CM | POA: Diagnosis not present

## 2023-06-14 DIAGNOSIS — F17290 Nicotine dependence, other tobacco product, uncomplicated: Secondary | ICD-10-CM | POA: Insufficient documentation

## 2023-06-14 DIAGNOSIS — R162 Hepatomegaly with splenomegaly, not elsewhere classified: Secondary | ICD-10-CM | POA: Insufficient documentation

## 2023-06-14 LAB — CBC WITH DIFFERENTIAL/PLATELET
Abs Immature Granulocytes: 0.02 10*3/uL (ref 0.00–0.07)
Basophils Absolute: 0 10*3/uL (ref 0.0–0.1)
Basophils Relative: 1 %
Eosinophils Absolute: 0.2 10*3/uL (ref 0.0–0.5)
Eosinophils Relative: 2 %
HCT: 21.2 % — ABNORMAL LOW (ref 36.0–46.0)
Hemoglobin: 7.1 g/dL — ABNORMAL LOW (ref 12.0–15.0)
Immature Granulocytes: 0 %
Lymphocytes Relative: 17 %
Lymphs Abs: 1.1 10*3/uL (ref 0.7–4.0)
MCH: 26.7 pg (ref 26.0–34.0)
MCHC: 33.5 g/dL (ref 30.0–36.0)
MCV: 79.7 fL — ABNORMAL LOW (ref 80.0–100.0)
Monocytes Absolute: 0.3 10*3/uL (ref 0.1–1.0)
Monocytes Relative: 5 %
Neutro Abs: 4.9 10*3/uL (ref 1.7–7.7)
Neutrophils Relative %: 75 %
Platelets: 107 10*3/uL — ABNORMAL LOW (ref 150–400)
RBC: 2.66 MIL/uL — ABNORMAL LOW (ref 3.87–5.11)
RDW: 18.9 % — ABNORMAL HIGH (ref 11.5–15.5)
WBC: 6.6 10*3/uL (ref 4.0–10.5)
nRBC: 0 % (ref 0.0–0.2)

## 2023-06-14 LAB — FERRITIN: Ferritin: 240 ng/mL (ref 11–307)

## 2023-06-14 LAB — TECHNOLOGIST SMEAR REVIEW: Plt Morphology: DECREASED

## 2023-06-14 LAB — RETIC PANEL
Immature Retic Fract: 5.6 % (ref 2.3–15.9)
RBC.: 2.6 MIL/uL — ABNORMAL LOW (ref 3.87–5.11)
Retic Count, Absolute: 234.5 10*3/uL — ABNORMAL HIGH (ref 19.0–186.0)
Retic Ct Pct: 9 % — ABNORMAL HIGH (ref 0.4–3.1)
Reticulocyte Hemoglobin: 30.8 pg (ref >27.9)

## 2023-06-14 LAB — SAMPLE TO BLOOD BANK

## 2023-06-14 LAB — IRON AND TIBC
Iron: 123 ug/dL (ref 28–170)
Saturation Ratios: 38 % — ABNORMAL HIGH (ref 10.4–31.8)
TIBC: 328 ug/dL (ref 250–450)
UIBC: 205 ug/dL

## 2023-06-14 LAB — IMMATURE PLATELET FRACTION: Immature Platelet Fraction: 2.1 % (ref 1.2–8.6)

## 2023-06-14 LAB — HIV ANTIBODY (ROUTINE TESTING W REFLEX): HIV Screen 4th Generation wRfx: NONREACTIVE

## 2023-06-14 LAB — LACTATE DEHYDROGENASE: LDH: 254 U/L — ABNORMAL HIGH (ref 98–192)

## 2023-06-14 NOTE — Assessment & Plan Note (Signed)
 Possibly due to fatty liver disease.  Check CMV and EBV, HIV

## 2023-06-14 NOTE — Progress Notes (Signed)
 Patient denies new or acute problems/concerns today.

## 2023-06-14 NOTE — Progress Notes (Signed)
 Hematology/Oncology Progress note [phone] [phone]            Patient Care Team: Anselm Jungling, NP as PCP - General (Nurse Practitioner) Rickard Patience, MD as Consulting Physician (Oncology)  ASSESSMENT & PLAN:   IDA (iron deficiency anemia) Patient tolerates IV Venofer treatments previously. Labs reviewed and discussed with patient. Lab Results  Component Value Date   HGB 7.1 (L) 06/14/2023   TIBC 328 06/14/2023   IRONPCTSAT 38 (H) 06/14/2023   FERRITIN 240 06/14/2023    Hemoglobin remains low, iron panel showed adequate iron store. could be falsely elevated due to recent infection. Hold off IV venofer     Low serum vitamin B12 S/p vitamin B12 injections, she did not tolerate due to muscle cramps.  B12 level stable continue sublingual B12 daily.    Normocytic anemia Check cbc, smear, haptoglobin, Ldh, myeloma panel, parvo virus DNA PCR, CMV  Hepatomegaly Possibly due to fatty liver disease.  Check CMV and EBV, HIV    Orders Placed This Encounter  Procedures   Ferritin    Standing Status:   Future    Number of Occurrences:   1    Expected Date:   06/14/2023    Expiration Date:   12/15/2023   Iron and TIBC    Standing Status:   Future    Number of Occurrences:   1    Expected Date:   06/14/2023    Expiration Date:   06/13/2024   CBC with Differential/Platelet    Standing Status:   Future    Number of Occurrences:   1    Expected Date:   06/14/2023    Expiration Date:   06/13/2024   Retic Panel    Standing Status:   Future    Number of Occurrences:   1    Expected Date:   06/14/2023    Expiration Date:   06/13/2024   Multiple Myeloma Panel (SPEP&IFE w/QIG)    Standing Status:   Future    Number of Occurrences:   1    Expected Date:   06/14/2023    Expiration Date:   06/13/2024   Kappa/lambda light chains    Standing Status:   Future    Number of Occurrences:   1    Expected Date:   06/14/2023    Expiration Date:   06/13/2024    Lactate dehydrogenase    Standing Status:   Future    Number of Occurrences:   1    Expected Date:   06/14/2023    Expiration Date:   06/13/2024   Haptoglobin    Standing Status:   Future    Number of Occurrences:   1    Expected Date:   06/14/2023    Expiration Date:   06/13/2024   HIV Antibody (routine testing w rflx)    Standing Status:   Future    Number of Occurrences:   1    Expected Date:   06/14/2023    Expiration Date:   06/13/2024   Immature Platelet Fraction    Standing Status:   Future    Number of Occurrences:   1    Expected Date:   06/14/2023    Expiration Date:   06/13/2024   Technologist smear review    Standing Status:   Future    Number of Occurrences:   1    Expected Date:   06/14/2023    Expiration Date:   06/13/2024  Clinical information::   thrombocytopenia and anmeia   Human parvovirus DNA detection by PCR    Standing Status:   Future    Number of Occurrences:   1    Expected Date:   06/14/2023    Expiration Date:   06/13/2024   CMV DNA, quantitative, PCR    Standing Status:   Future    Number of Occurrences:   1    Expected Date:   06/14/2023    Expiration Date:   06/13/2024   Hold Tube- Blood Bank    Standing Status:   Future    Number of Occurrences:   1    Expected Date:   06/14/2023    Expiration Date:   06/13/2024    Follow-up in 4 months All questions were answered. The patient knows to call the clinic with any problems, questions or concerns.  Rickard Patience, MD, PhD [facility] Hematology Oncology 06/14/2023   CHIEF COMPLAINTS/REASON FOR VISIT:  iron deficiency anemia  HISTORY OF PRESENTING ILLNESS:   Melinda Stone is a  43 y.o.  female presents for iron deficiency anemia.  Patient had blood work done on 07/13/2021, CBC showed hemoglobin of 9.0, MCV 75.3, ferritin 14, iron saturation 21, TIBC 455.  Reviewed her previous lab records.  Patient has chronic anemia since at least 2019.  + Nausea, no vomiting.  Chronic diarrhea after each  meal.  Unintentional weight loss Patient reports that she has had colonoscopy done in May which did not reveal etiology of diarrhea.  Colonoscopy record was not available to me at the time of dictation. 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    INTERVAL HISTORY Lameisha L Stiggers is a 43 y.o. female who has above history reviewed by me today presents for follow up visit for iron deficiency anemia Patient takes oral vitamin B12 supplementation at 1000 mcg daily. Chronic fatigue chronic diarrhea, unchanged.  Denies hematochezia, hematuria, hematemesis, epistaxis, black tarry stool or easy bruising.  05/11/2023 - 05/23/2023 Patient was hospitalized due to hypoxic respiratory failure, influenza A, ARDS, pneumonia and she was discharged to rehab after hospitalization.  Patient recently had blood work done showing hemoglobin 6.5.  She was asked to present to emergency room and received 1 unit of PRBC 2 days ago.  Patient reports feeling tired and fatigued.  She denies fever, chills, shortness of breath  Review of Systems  Constitutional:  Positive for fatigue. Negative for chills and fever.  HENT:   Negative for hearing loss and voice change.   Eyes:  Negative for eye problems.  Respiratory:  Negative for chest tightness and cough.   Cardiovascular:  Negative for chest pain.  Gastrointestinal:  Negative for abdominal distention, abdominal pain, blood in stool, diarrhea, nausea and vomiting.  Endocrine: Negative for hot flashes.  Genitourinary:  Negative for difficulty urinating and frequency.   Musculoskeletal:  Positive for back pain. Negative for arthralgias.  Skin:  Negative for itching and rash.  Neurological:  Positive for numbness. Negative for extremity weakness.  Hematological:  Negative for adenopathy.  Psychiatric/Behavioral:  Negative for confusion.     MEDICAL HISTORY:  Past Medical History:  Diagnosis Date   Anemia     Arthritis 08/17/2020   Chronic back pain    Diabetes mellitus without complication (HCC)    type 2   GERD (gastroesophageal reflux disease)    Migraine    Ovarian cyst    Pneumonia    Polyneuropathy  SURGICAL HISTORY: Past Surgical History:  Procedure Laterality Date   CESAREAN SECTION     CHOLECYSTECTOMY  2004   LUMBAR LAMINECTOMY N/A 05/30/2020   Procedure: Lumbar five Laminectomy,  Bilateral Microdiscectomy, Left Lumbar five -Sacral one Microdiscectomy;  Surgeon: [name], MD;  Location: [facility] OR;  Service: Orthopedics;  Laterality: N/A;   LUMBAR LAMINECTOMY Left 11/14/2020   Procedure: LEFT LUMBAR FOUR-FIVE MICRODISCECTOMY;  Surgeon: [name], MD;  Location: [facility] OR;  Service: Orthopedics;  Laterality: Left;   TUBAL LIGATION  10/19/2010    SOCIAL HISTORY: Social History   Socioeconomic History   Marital status: Single    Spouse name: Not on file   Number of children: 3   Years of education: Not on file   Highest education level: High school graduate  Occupational History    Comment: home maker  Tobacco Use   Smoking status: Every Day    Types: Cigars    Last attempt to quit: 12/23/2021    Years since quitting: 1.4   Smokeless tobacco: Never   Tobacco comments:    2 cigars daily. Khj 02/12/2023  Vaping Use   Vaping status: Never Used  Substance and Sexual Activity   Alcohol use: Not Currently   Drug use: Not Currently    Comment: per patient stopped smoking marijuana Mar 2022   Sexual activity: Yes    Birth control/protection: Surgical  Other Topics Concern   Not on file  Social History Narrative   Lives with 3 children   Some coffee   Social Drivers of Corporate investment banker Strain: Not on file  Food Insecurity: Patient Unable To Answer (05/15/2023)   Hunger Vital Sign    Worried About Running Out of Food in the Last Year: Patient unable to answer    Ran Out of Food in the Last Year: Patient unable to answer  Transportation Needs: Patient  Unable To Answer (05/15/2023)   PRAPARE - Transportation    Lack of Transportation (Medical): Patient unable to answer    Lack of Transportation (Non-Medical): Patient unable to answer  Physical Activity: Not on file  Stress: Not on file  Social Connections: Socially Isolated (03/26/2023)   Social Connection and Isolation Panel [NHANES]    Frequency of Communication with Friends and Family: Never    Frequency of Social Gatherings with Friends and Family: Never    Attends Religious Services: Never    Database administrator or Organizations: No    Attends Banker Meetings: Never    Marital Status: Never married  Intimate Partner Violence: Patient Unable To Answer (05/15/2023)   Humiliation, Afraid, Rape, and Kick questionnaire    Fear of Current or Ex-Partner: Patient unable to answer    Emotionally Abused: Patient unable to answer    Physically Abused: Patient unable to answer    Sexually Abused: Patient unable to answer    FAMILY HISTORY: Family History  Problem Relation Age of Onset   Diabetes Mother    Hypertension Mother    Stroke Mother     ALLERGIES:  is allergic to trulicity [dulaglutide].  MEDICATIONS:  Current Outpatient Medications  Medication Sig Dispense Refill   acetaminophen (TYLENOL) 325 MG tablet Take 1-2 tablets (325-650 mg total) by mouth every 4 (four) hours as needed for mild pain (pain score 1-3).     albuterol (VENTOLIN HFA) 108 (90 Base) MCG/ACT inhaler Inhale 2 puffs into the lungs every 6 (six) hours as needed for wheezing or shortness  of breath. 18 g 2   amitriptyline (ELAVIL) 25 MG tablet Take 1 tablet (25 mg total) by mouth at bedtime. 30 tablet 6   cholecalciferol (VITAMIN D3) 25 MCG (1000 UNIT) tablet Take 1,000 Units by mouth daily.     Cyanocobalamin (VITAMIN B-12) 2500 MCG SUBL Place 1 tablet (2,500 mcg total) under the tongue daily. 30 tablet 2   cyclobenzaprine (FLEXERIL) 5 MG tablet Take 1 tablet (5 mg total) by mouth 3 (three) times  daily as needed for muscle spasms. 30 tablet 0   diclofenac Sodium (VOLTAREN) 1 % GEL Apply 2 g topically 3 (three) times daily.     ferrous sulfate 325 (65 FE) MG tablet Take 1 tablet (325 mg total) by mouth daily with breakfast. 30 tablet 0   gabapentin (NEURONTIN) 400 MG capsule Take 1 capsule (400 mg total) by mouth every 8 (eight) hours. 90 capsule 0   insulin glargine (LANTUS SOLOSTAR) 100 UNIT/ML Solostar Pen Inject 40 Units into the skin daily before breakfast. 12 mL 1   metoprolol tartrate (LOPRESSOR) 25 MG tablet Take 1 tablet (25 mg total) by mouth 2 (two) times daily. 60 tablet 0   mometasone-formoterol (DULERA) 100-5 MCG/ACT AERO Inhale 2 puffs into the lungs 2 (two) times daily at 10 AM and 5 PM. 13 g 0   Multiple Vitamin (MULTIVITAMIN WITH MINERALS) TABS tablet Take 1 tablet by mouth daily.     oxyCODONE-acetaminophen (PERCOCET/ROXICET) 5-325 MG tablet Take 1 tablet by mouth every 6 (six) hours as needed.     pantoprazole (PROTONIX) 40 MG tablet Take 1 tablet (40 mg total) by mouth daily. 30 tablet 0   Ubrogepant (UBRELVY) 100 MG TABS Take 1 tablet (100 mg total) by mouth as needed. Take 1 tablet at onset of headache, may repeat in 2 hours if needed. Max is 200 mg in 24 hours. 12 tablet 11   vitamin C (ASCORBIC ACID) 250 MG tablet Take 2 tablets (500 mg total) by mouth daily. (Patient taking differently: Take 250 mg by mouth daily.) 180 tablet 0   No current facility-administered medications for this visit.     PHYSICAL EXAMINATION: ECOG PERFORMANCE STATUS: 2 - Symptomatic, <50% confined to bed Vitals:   06/14/23 1000 06/14/23 1056  BP: (!) 163/89 (!) 169/65  Pulse: 89   Resp: 18   Temp: (!) 96.8 F (36 C)   SpO2: 100%    Filed Weights   06/14/23 1000  Weight: 218 lb 14.4 oz (99.3 kg)      Physical Exam Constitutional:      General: She is not in acute distress.    Comments: Patient sits in the wheelchair  HENT:     Head: Normocephalic and atraumatic.  Eyes:      General: No scleral icterus. Cardiovascular:     Rate and Rhythm: Normal rate and regular rhythm.     Heart sounds: Normal heart sounds.  Pulmonary:     Effort: Pulmonary effort is normal. No respiratory distress.     Breath sounds: No wheezing.  Abdominal:     General: Bowel sounds are normal. There is no distension.     Palpations: Abdomen is soft.  Musculoskeletal:        General: No deformity. Normal range of motion.     Cervical back: Normal range of motion and neck supple.  Skin:    General: Skin is warm and dry.  Neurological:     Mental Status: She is alert and oriented to person, place,  and time. Mental status is at baseline.     Cranial Nerves: No cranial nerve deficit.     Coordination: Coordination normal.  Psychiatric:        Mood and Affect: Mood normal.     LABORATORY DATA:  I have reviewed the data as listed    Latest Ref Rng & Units 06/14/2023   11:55 AM 06/11/2023    2:50 PM 06/11/2023   11:22 AM  CBC  WBC 4.0 - 10.5 K/uL 6.6  8.4  7.6   Hemoglobin 12.0 - 15.0 g/dL 7.1  6.8  6.5   Hematocrit 36.0 - 46.0 % 21.2  20.2  19.3   Platelets 150 - 400 K/uL 107  110  109       Latest Ref Rng & Units 06/11/2023    2:50 PM 05/27/2023    5:50 AM 05/26/2023    1:29 PM  CMP  Glucose 70 - 99 mg/dL 161  096  045   BUN 6 - 20 mg/dL 20  20  18    Creatinine 0.44 - 1.00 mg/dL 4.09  8.11  9.14   Sodium 135 - 145 mmol/L 133  136  135   Potassium 3.5 - 5.1 mmol/L 4.4  4.0  4.0   Chloride 98 - 111 mmol/L 105  108  107   CO2 22 - 32 mmol/L 21  22  21    Calcium 8.9 - 10.3 mg/dL 8.1  8.2  8.5   Total Protein 6.5 - 8.1 g/dL 5.8     Total Bilirubin 0.0 - 1.2 mg/dL 1.0     Alkaline Phos 38 - 126 U/L 90     AST 15 - 41 U/L 15     ALT 0 - 44 U/L 13        Iron/TIBC/Ferritin/ %Sat    Component Value Date/Time   IRON 123 06/14/2023 1155   IRON 64 01/02/2019 0955   TIBC 328 06/14/2023 1155   TIBC 382 01/02/2019 0955   FERRITIN 240 06/14/2023 1155   FERRITIN 21 01/02/2019  0955   IRONPCTSAT 38 (H) 06/14/2023 1155   IRONPCTSAT 17 01/02/2019 0955      RADIOGRAPHIC STUDIES: I have personally reviewed the radiological images as listed and agreed with the findings in the report. DG Ankle Complete Left Result Date: 05/26/2023 CLINICAL DATA:  [id number] Acute left ankle pain [id number] EXAM: LEFT ANKLE COMPLETE - 3+ VIEW COMPARISON:  March 25, 2023 FINDINGS: Osteopenia. No acute fracture or dislocation. Joint spaces and alignment are maintained. No area of erosion or osseous destruction. No unexpected radiopaque foreign body. Soft tissues are unremarkable. IMPRESSION: No acute fracture or dislocation. Electronically Signed   By: Meda Klinefelter M.D.   On: 05/26/2023 12:32   DG Swallowing Func-Speech Pathology Result Date: 05/20/2023 Table formatting from the original result was not included. Modified Barium Swallow Study Patient Details Name: OLUWATOBI RUPPE MRN: [id number] Date of Birth: 01-Feb-1981 Today's Date: 05/20/2023 HPI/PMH: HPI: DILARA NAVARRETE is a 43 yo female presenting to ED 2/22 with complaints of nausea, vomiting, abdominal pain, and myalgias. Found to be Influenza A positive. CTA showed R predominant consolidative and nodular opacities. ETT 2/23-2/28. 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 Boluses progress posteriorly to the level of the pyriform sinuses prior to  swallow initiation, which intermittently allows thin liquids to enter her laryngeal vestibule. At times, they are immediately ejected by complete laryngeal elevation but other times, they progress quickly below the level of the vocal folds without being sensed (PAS 8). No further penetration or aspiration was observed  with subsequent trials of nectar and honey thick liquids, purees, or solids. She initiates efficient mastication with complete oral clearance. Pt does have a collection of vallecular residue with solids specifically, but a liquid wash was effective in clearing this. Recommend initiating diet of regular solids with nectar thick liquids. SLP will f/u to trial RMST and compensatory strategies to upgrade diet as clinically indicated. DIGEST Swallow Severity Rating*  Safety: 2  Efficiency: 2  Overall Pharyngeal Swallow Severity: 2 (moderate) 1: mild; 2: moderate; 3: severe; 4: profound *The Dynamic Imaging Grade of Swallowing Toxicity is standardized for the head and neck cancer population, however, demonstrates promising clinical applications across populations to standardize the clinical rating of pharyngeal swallow safety and severity. Factors that may increase risk of adverse event in presence of aspiration [name] & Clearance [name] 2021): Factors that may increase risk of adverse event in presence of aspiration [name] & Clearance [name] 2021): Frail or deconditioned; Weak cough; Presence of tubes (ETT, trach, NG, etc.) Recommendations/Plan: Swallowing Evaluation Recommendations Swallowing Evaluation Recommendations Recommendations: PO diet PO Diet Recommendation: Regular; Mildly thick liquids (Level 2, nectar thick) Liquid Administration via: Cup; Straw Medication Administration: Whole meds with puree Supervision: Patient able to self-feed; Full supervision/cueing for swallowing strategies Swallowing strategies  : Minimize environmental distractions; Slow rate; Small bites/sips Postural changes: Position pt fully upright for meals Oral care recommendations: Oral care BID (2x/day) Caregiver Recommendations: Avoid jello, ice cream, thin soups, popsicles; Remove water pitcher Treatment Plan Treatment Plan Treatment recommendations: Therapy as outlined in treatment plan below Follow-up recommendations: Acute inpatient rehab (3  hours/day) Functional status assessment: Patient has had a recent decline in their functional status and demonstrates the ability to make significant improvements in function in a reasonable and predictable amount of time. Treatment frequency: Min 2x/week Treatment duration: 2 weeks Interventions: Aspiration precaution training; Compensatory techniques; Patient/family education; Trials of upgraded texture/liquids; Diet toleration management by SLP; Respiratory muscle strength training Recommendations Recommendations for follow up therapy are one component of a multi-disciplinary discharge planning process, led by the attending physician.  Recommendations may be updated based on patient status, additional functional criteria and insurance authorization. Assessment: Orofacial Exam: Orofacial Exam Oral Cavity: Oral Hygiene: WFL Oral Cavity - Dentition: Adequate natural dentition Orofacial Anatomy: WFL Oral Motor/Sensory Function: WFL Anatomy: Anatomy: Suspected cervical osteophytes Boluses Administered: Boluses Administered Boluses Administered: Thin liquids (Level 0); Mildly thick liquids (Level 2, nectar thick); Moderately thick liquids (Level 3, honey thick); Puree; Solid  Oral Impairment Domain: Oral Impairment Domain Lip Closure: No labial escape Tongue control during bolus hold: Posterior escape of less than half of bolus Bolus preparation/mastication: Timely and efficient chewing and mashing Bolus transport/lingual motion: Brisk tongue motion Oral residue: Complete oral clearance Location of oral residue : N/A Initiation of pharyngeal swallow : Pyriform sinuses  Pharyngeal Impairment Domain: Pharyngeal Impairment Domain Soft palate elevation: No bolus between soft palate (SP)/pharyngeal wall (PW) Laryngeal elevation: Complete superior movement of thyroid cartilage with complete approximation of arytenoids to epiglottic petiole Anterior hyoid excursion: Complete anterior movement Epiglottic movement: Complete  inversion Laryngeal vestibule closure: Complete, no air/contrast in laryngeal vestibule Pharyngeal stripping wave : Present - complete Pharyngeal contraction (A/P view only): N/A Pharyngoesophageal segment opening: Complete distension and complete duration, no  obstruction of flow Tongue base retraction: Trace column of contrast or air between tongue base and PPW Pharyngeal residue: Collection of residue within or on pharyngeal structures Location of pharyngeal residue: Valleculae  Esophageal Impairment Domain: No data recorded Pill: No data recorded Penetration/Aspiration Scale Score: Penetration/Aspiration Scale Score 1.  Material does not enter airway: Moderately thick liquids (Level 3, honey thick); Puree; Solid; Mildly thick liquids (Level 2, nectar thick) 8.  Material enters airway, passes BELOW cords without attempt by patient to eject out (silent aspiration) : Thin liquids (Level 0) Compensatory Strategies: Compensatory Strategies Compensatory strategies: Yes Straw: Ineffective Ineffective Straw: Thin liquid (Level 0)   General Information: Caregiver present: No  Diet Prior to this Study: NPO; Cortrak/Small bore NG tube   Temperature : Normal   Respiratory Status: WFL   Supplemental O2: Nasal cannula   History of Recent Intubation: Yes  Behavior/Cognition: Alert; Cooperative; Pleasant mood Self-Feeding Abilities: Able to self-feed Baseline vocal quality/speech: Dysphonic; Hypophonia/low volume Volitional Cough: Able to elicit Volitional Swallow: Able to elicit Exam Limitations: No limitations Goal Planning: Prognosis for improved oropharyngeal function: Good Barriers to Reach Goals: Time post onset No data recorded Patient/Family Stated Goal: wants to eat/drink Consulted and agree with results and recommendations: Patient; Nurse Pain: Pain Assessment Pain Assessment: Faces Faces Pain Scale: 0 Facial Expression: 0 Body Movements: 0 Muscle Tension: 0 Compliance with ventilator (intubated pts.): N/A Vocalization  (extubated pts.): 0 CPOT Total: 0 Pain Location: pt reports back Pain Intervention(s): Monitored during session End of Session: Start Time:SLP Start Time (ACUTE ONLY): 1335 Stop Time: SLP Stop Time (ACUTE ONLY): 1352 Time Calculation:SLP Time Calculation (min) (ACUTE ONLY): 17 min Charges: SLP Evaluations $ SLP Speech Visit: 1 Visit SLP Evaluations $BSS Swallow: 1 Procedure $MBS Swallow: 1 Procedure SLP visit diagnosis: SLP Visit Diagnosis: Dysphagia, unspecified (R13.10) Past Medical History: Past Medical History: Diagnosis Date  Anemia   Arthritis 08/17/2020  Chronic back pain   Diabetes mellitus without complication (HCC)   type 2  GERD (gastroesophageal reflux disease)   Migraine   Ovarian cyst   Pneumonia   Polyneuropathy  Past Surgical History: Past Surgical History: Procedure Laterality Date  CESAREAN SECTION    CHOLECYSTECTOMY  2004  LUMBAR LAMINECTOMY N/A 05/30/2020  Procedure: Lumbar five Laminectomy,  Bilateral Microdiscectomy, Left Lumbar five -Sacral one Microdiscectomy;  Surgeon: [name], MD;  Location: [facility] OR;  Service: Orthopedics;  Laterality: N/A;  LUMBAR LAMINECTOMY Left 11/14/2020  Procedure: LEFT LUMBAR FOUR-FIVE MICRODISCECTOMY;  Surgeon: [name], MD;  Location: [facility] OR;  Service: Orthopedics;  Laterality: Left;  TUBAL LIGATION  10/19/2010 [name], M.A., CF-SLP Speech Language Pathology, Acute Rehabilitation Services Secure Chat preferred [phone] 05/20/2023, 3:14 PM  CT HEAD WO CONTRAST ( ) Result Date: 05/18/2023 CLINICAL DATA:  Delirium. Altered level of consciousness after extubation. EXAM: CT HEAD WITHOUT CONTRAST TECHNIQUE: Contiguous axial images were obtained from the base of the skull through the vertex without intravenous contrast. RADIATION DOSE REDUCTION: This exam was performed according to the departmental dose-optimization program which includes automated exposure control, adjustment of the mA and/or kV according to patient size and/or use of iterative  reconstruction technique. COMPARISON:  06/08/2021 CT and brain MRI 05/11/2023 FINDINGS: Brain: No evidence of acute infarction, hemorrhage, hydrocephalus, extra-axial collection or mass lesion/mass effect. Vascular: No hyperdense vessel or unexpected calcification. Skull: Normal. Negative for fracture or focal lesion. Sinuses/Orbits: Extensive sinus opacification asymmetrically affecting the right-sided sinuses with fluid level/frothy secretions. Partial bilateral mastoid opacification. The patient is nasally intubated. IMPRESSION:  1. Normal appearance of the brain. 2. Active bilateral sinusitis which is notably progressed from 05/11/2023. Electronically Signed   By: Tiburcio Pea M.D.   On: 05/18/2023 09:55   DG Abd Portable 1V Result Date: 05/17/2023 CLINICAL DATA:  Feeding tube placement EXAM: PORTABLE ABDOMEN - 1 VIEW COMPARISON:  05/12/2023 FINDINGS: Limited field of view for tube placement verification purposes. An enteric tube has been placed with tip in the right upper quadrant consistent with location in the distal stomach or proximal duodenal. Mildly gas distended small bowel in the upper abdomen could represent obstruction, enteritis, or ileus. Clinical correlation recommended. Surgical clips in the right upper quadrant. Lung bases are clear. IMPRESSION: 1. Feeding tube tip projects over the expected location of the distal stomach or proximal duodenal. 2. Mildly dilated gas-filled small bowel may represent ileus, enteritis, or obstruction. Clinical correlation recommended. Electronically Signed   By: Burman Nieves M.D.   On: 05/17/2023 15:08

## 2023-06-14 NOTE — Assessment & Plan Note (Addendum)
 Check cbc, smear, haptoglobin, Ldh, myeloma panel, parvo virus DNA PCR, CMV Consider bone marrow biopsy if results do not explain her persistent anemia.

## 2023-06-14 NOTE — Assessment & Plan Note (Addendum)
 Patient tolerates IV Venofer treatments previously. Labs reviewed and discussed with patient. Lab Results  Component Value Date   HGB 7.1 (L) 06/14/2023   TIBC 328 06/14/2023   IRONPCTSAT 38 (H) 06/14/2023   FERRITIN 240 06/14/2023    Hemoglobin remains low, iron panel showed adequate iron store. could be falsely elevated due to recent infection. Hold off IV venofer

## 2023-06-14 NOTE — Assessment & Plan Note (Signed)
 S/p vitamin B12 injections, she did not tolerate due to muscle cramps.  B12 level stable continue sublingual B12 daily.

## 2023-06-15 LAB — CMV DNA, QUANTITATIVE, PCR
CMV DNA Quant: NEGATIVE [IU]/mL
Log10 CMV Qn DNA Pl: UNDETERMINED {Log_IU}/mL

## 2023-06-16 LAB — HAPTOGLOBIN: Haptoglobin: <10 mg/dL — ABNORMAL LOW (ref 42–296)

## 2023-06-17 LAB — MULTIPLE MYELOMA PANEL, SERUM
Albumin SerPl Elph-Mcnc: 3.2 g/dL (ref 2.9–4.4)
Albumin/Glob SerPl: 1.2 (ref 0.7–1.7)
Alpha 1: 0.2 g/dL (ref 0.0–0.4)
Alpha2 Glob SerPl Elph-Mcnc: 0.5 g/dL (ref 0.4–1.0)
B-Globulin SerPl Elph-Mcnc: 0.9 g/dL (ref 0.7–1.3)
Gamma Glob SerPl Elph-Mcnc: 1.1 g/dL (ref 0.4–1.8)
Globulin, Total: 2.7 g/dL (ref 2.2–3.9)
IgA: 295 mg/dL (ref 87–352)
IgG (Immunoglobin G), Serum: 1222 mg/dL (ref 586–1602)
IgM (Immunoglobulin M), Srm: 96 mg/dL (ref 26–217)
Total Protein ELP: 5.9 g/dL — ABNORMAL LOW (ref 6.0–8.5)

## 2023-06-17 LAB — KAPPA/LAMBDA LIGHT CHAINS
Kappa free light chain: 47.3 mg/L — ABNORMAL HIGH (ref 3.3–19.4)
Kappa, lambda light chain ratio: 1.16 (ref 0.26–1.65)
Lambda free light chains: 40.8 mg/L — ABNORMAL HIGH (ref 5.7–26.3)

## 2023-06-18 ENCOUNTER — Encounter: Payer: Self-pay | Admitting: Oncology

## 2023-06-18 ENCOUNTER — Ambulatory Visit

## 2023-06-18 ENCOUNTER — Ambulatory Visit: Admitting: Oncology

## 2023-06-19 LAB — HUMAN PARVOVIRUS DNA DETECTION BY PCR: Parvovirus B19, PCR: NEGATIVE

## 2023-06-21 ENCOUNTER — Encounter: Payer: Self-pay | Admitting: Internal Medicine

## 2023-06-21 ENCOUNTER — Encounter (HOSPITAL_COMMUNITY): Payer: Self-pay

## 2023-06-21 ENCOUNTER — Ambulatory Visit (INDEPENDENT_AMBULATORY_CARE_PROVIDER_SITE_OTHER): Admitting: Internal Medicine

## 2023-06-21 ENCOUNTER — Other Ambulatory Visit: Payer: Self-pay

## 2023-06-21 ENCOUNTER — Emergency Department (HOSPITAL_COMMUNITY)
Admission: EM | Admit: 2023-06-21 | Discharge: 2023-06-22 | Disposition: A | Discharge status: Home / Self Care | Attending: Emergency Medicine | Admitting: Emergency Medicine

## 2023-06-21 VITALS — BP 136/84 | HR 100 | Ht 69.0 in | Wt 217.0 lb

## 2023-06-21 DIAGNOSIS — D649 Anemia, unspecified: Secondary | ICD-10-CM | POA: Diagnosis not present

## 2023-06-21 DIAGNOSIS — E11319 Type 2 diabetes mellitus with unspecified diabetic retinopathy without macular edema: Secondary | ICD-10-CM

## 2023-06-21 DIAGNOSIS — Z7984 Long term (current) use of oral hypoglycemic drugs: Secondary | ICD-10-CM | POA: Diagnosis not present

## 2023-06-21 DIAGNOSIS — N939 Abnormal uterine and vaginal bleeding, unspecified: Secondary | ICD-10-CM | POA: Insufficient documentation

## 2023-06-21 DIAGNOSIS — Z794 Long term (current) use of insulin: Secondary | ICD-10-CM | POA: Insufficient documentation

## 2023-06-21 DIAGNOSIS — E1142 Type 2 diabetes mellitus with diabetic polyneuropathy: Secondary | ICD-10-CM | POA: Diagnosis not present

## 2023-06-21 DIAGNOSIS — R42 Dizziness and giddiness: Secondary | ICD-10-CM | POA: Diagnosis present

## 2023-06-21 DIAGNOSIS — E119 Type 2 diabetes mellitus without complications: Secondary | ICD-10-CM | POA: Insufficient documentation

## 2023-06-21 LAB — CBC
HCT: 19.4 % — ABNORMAL LOW (ref 36.0–46.0)
Hemoglobin: 6.6 g/dL — CL (ref 12.0–15.0)
MCH: 27.6 pg (ref 26.0–34.0)
MCHC: 34 g/dL (ref 30.0–36.0)
MCV: 81.2 fL (ref 80.0–100.0)
Platelets: 184 10*3/uL (ref 150–400)
RBC: 2.39 MIL/uL — ABNORMAL LOW (ref 3.87–5.11)
RDW: 18.6 % — ABNORMAL HIGH (ref 11.5–15.5)
WBC: 8.4 10*3/uL (ref 4.0–10.5)
nRBC: 0 % (ref 0.0–0.2)

## 2023-06-21 LAB — HCG, SERUM, QUALITATIVE: Preg, Serum: NEGATIVE

## 2023-06-21 LAB — COMPREHENSIVE METABOLIC PANEL WITH GFR
ALT: 13 U/L (ref 0–44)
AST: 15 U/L (ref 15–41)
Albumin: 3 g/dL — ABNORMAL LOW (ref 3.5–5.0)
Alkaline Phosphatase: 102 U/L (ref 38–126)
Anion gap: 6 (ref 5–15)
BUN: 23 mg/dL — ABNORMAL HIGH (ref 6–20)
CO2: 20 mmol/L — ABNORMAL LOW (ref 22–32)
Calcium: 8.4 mg/dL — ABNORMAL LOW (ref 8.9–10.3)
Chloride: 111 mmol/L (ref 98–111)
Creatinine, Ser: 1.16 mg/dL — ABNORMAL HIGH (ref 0.44–1.00)
GFR, Estimated: >60 mL/min (ref >60)
Glucose, Bld: 131 mg/dL — ABNORMAL HIGH (ref 70–99)
Potassium: 4.2 mmol/L (ref 3.5–5.1)
Sodium: 137 mmol/L (ref 135–145)
Total Bilirubin: 1.2 mg/dL (ref 0.0–1.2)
Total Protein: 6.1 g/dL — ABNORMAL LOW (ref 6.5–8.1)

## 2023-06-21 LAB — PREPARE RBC (CROSSMATCH)

## 2023-06-21 MED ORDER — GLIPIZIDE 10 MG PO TABS: ORAL_TABLET | ORAL | 3 refills | Status: DC

## 2023-06-21 MED ORDER — LANTUS SOLOSTAR 100 UNIT/ML ~~LOC~~ SOPN: 40.0000 [IU] | PEN_INJECTOR | Freq: Every day | SUBCUTANEOUS | 2 refills | Status: DC

## 2023-06-21 MED ORDER — INSULIN PEN NEEDLE 32G X 4 MM MISC: 1.0000 | Freq: Every day | 3 refills | Status: DC

## 2023-06-21 MED ORDER — SODIUM CHLORIDE 0.9% IV SOLUTION: Freq: Once | INTRAVENOUS | Status: DC

## 2023-06-21 MED ORDER — DEXCOM G7 SENSOR MISC: 1.0000 | 3 refills | Status: DC

## 2023-06-21 NOTE — Patient Instructions (Addendum)
-   Continue Glipizide 10 mg, TWO  tablets before Breakfast and ONE  Tablet Before supper  -Continue Lantus 40 units daily   HOW TO TREAT LOW BLOOD SUGARS (Blood sugar LESS THAN 70 MG/DL) Please follow the RULE OF 15 for the treatment of hypoglycemia treatment (when your (blood sugars are less than 70 mg/dL)   STEP 1: Take 15 grams of carbohydrates when your blood sugar is low, which includes:  3-4 GLUCOSE TABS  OR 3-4 OZ OF JUICE OR REGULAR SODA OR ONE TUBE OF GLUCOSE GEL    STEP 2: RECHECK blood sugar in 15 MINUTES STEP 3: If your blood sugar is still low at the 15 minute recheck --> then, go back to STEP 1 and treat AGAIN with another 15 grams of carbohydrates.

## 2023-06-21 NOTE — ED Provider Notes (Incomplete)
 [place] EMERGENCY DEPARTMENT AT [facility] Provider Note   CSN: [id number] Arrival date & time: 06/21/23  1743     History Chief Complaint  Patient presents with  . Weakness    Melinda Stone is a 43 y.o. female.  Patient with past history significant for anemia, type 2 diabetes, iron deficiency anemia, chronic pain syndrome presents to the emergency department today with concerns of weakness.  She states that she has been feeling weak, dizzy, and is concerned due to her history of low hemoglobin.  She states that she has not had any bloody stools but of note is currently on her menstrual cycle.  States that her period started about 2 days ago and has had heavy bleeding.  States that this is somewhat typical for her and has notably worsened in the last year with more persistent bleeding with cycles.  She has been following with OB/GYN but she states she is not currently on any birth control.  Not on any blood thinners.   Weakness      Home Medications Prior to Admission medications   Medication Sig Start Date End Date Taking? Authorizing Provider  acetaminophen (TYLENOL) 325 MG tablet Take 1-2 tablets (325-650 mg total) by mouth every 4 (four) hours as needed for mild pain (pain score 1-3). 05/29/23   Setzer, Lynnell Jude, PA-C  albuterol (VENTOLIN HFA) 108 (90 Base) MCG/ACT inhaler Inhale 2 puffs into the lungs every 6 (six) hours as needed for wheezing or shortness of breath. 05/29/23   Setzer, Lynnell Jude, PA-C  amitriptyline (ELAVIL) 25 MG tablet Take 1 tablet (25 mg total) by mouth at bedtime. 01/31/23   Glean Salvo, NP  Blood Glucose Monitoring Suppl (ONETOUCH VERIO FLEX SYSTEM) w/Device KIT 3 (three) times daily. 06/14/23   [provider]  celecoxib (CELEBREX) 100 MG capsule Take 100 mg by mouth 2 (two) times daily as needed. 06/13/23   [provider]  cholecalciferol (VITAMIN D3) 25 MCG (1000 UNIT) tablet Take 1,000 Units by mouth daily.     [provider]  Continuous Glucose Sensor (DEXCOM G7 SENSOR) MISC 1 Device by Does not apply route as directed. Change sensor every 10 days 06/21/23   Shamleffer, Konrad Dolores, MD  Cyanocobalamin (VITAMIN B-12) 2500 MCG SUBL Place 1 tablet (2,500 mcg total) under the tongue daily. 04/23/23   Rickard Patience, MD  cyclobenzaprine (FLEXERIL) 5 MG tablet Take 1 tablet (5 mg total) by mouth 3 (three) times daily as needed for muscle spasms. 05/29/23   Setzer, Lynnell Jude, PA-C  diclofenac Sodium (VOLTAREN) 1 % GEL Apply 2 g topically 3 (three) times daily. 05/29/23   Setzer, Lynnell Jude, PA-C  ferrous sulfate 325 (65 FE) MG tablet Take 1 tablet (325 mg total) by mouth daily with breakfast. 05/29/23   Setzer, Lynnell Jude, PA-C  gabapentin (NEURONTIN) 400 MG capsule Take 1 capsule (400 mg total) by mouth every 8 (eight) hours. Patient not taking: Reported on 06/21/2023 05/29/23   Milinda Antis, PA-C  gabapentin (NEURONTIN) 800 MG tablet Take 800 mg by mouth 3 (three) times daily.    [provider]  glipiZIDE (GLUCOTROL) 10 MG tablet Take 2 tablets (20 mg total) by mouth daily before breakfast AND 1 tablet (10 mg total) daily before supper. 06/21/23   Shamleffer, Konrad Dolores, MD  insulin glargine (LANTUS SOLOSTAR) 100 UNIT/ML Solostar Pen Inject 40 Units into the skin daily before breakfast. 06/21/23   Shamleffer, Konrad Dolores, MD  Insulin Pen Needle  32G X 4 MM MISC 1 Device by Does not apply route daily in the afternoon. 06/21/23   Shamleffer, Konrad Dolores, MD  Lancets [facility] DELICA PLUS [place]) MISC Apply topically 3 (three) times daily. 06/14/23   [provider]  metoprolol tartrate (LOPRESSOR) 25 MG tablet Take 1 tablet (25 mg total) by mouth 2 (two) times daily. 05/29/23 06/28/23  Setzer, Lynnell Jude, PA-C  mometasone-formoterol (DULERA) 100-5 MCG/ACT AERO Inhale 2 puffs into the lungs 2 (two) times daily at 10 AM and 5 PM. 05/29/23   Setzer, Lynnell Jude, PA-C  Multiple Vitamin (MULTIVITAMIN  WITH MINERALS) TABS tablet Take 1 tablet by mouth daily. 05/29/23   Setzer, Lynnell Jude, PA-C  ofloxacin (OCUFLOX) 0.3 % ophthalmic solution Place 1 drop into the left eye 4 (four) times daily. 06/19/23   [provider]  [facility] VERIO test strip 3 (three) times daily. 06/14/23   [provider]  oxyCODONE-acetaminophen (PERCOCET/ROXICET) 5-325 MG tablet Take 1 tablet by mouth every 6 (six) hours as needed.    [provider]  pantoprazole (PROTONIX) 40 MG tablet Take 1 tablet (40 mg total) by mouth daily. 05/29/23   Setzer, Lynnell Jude, PA-C  prednisoLONE acetate (PRED FORTE) 1 % ophthalmic suspension Place 1 drop into the left eye 4 (four) times daily. 06/19/23   [provider]  tiZANidine (ZANAFLEX) 4 MG tablet SMARTSIG:1 Tablet(s) By Mouth Every 12 Hours PRN 06/10/23   [provider]  Ubrogepant (UBRELVY) 100 MG TABS Take 1 tablet (100 mg total) by mouth as needed. Take 1 tablet at onset of headache, may repeat in 2 hours if needed. Max is 200 mg in 24 hours. 01/31/23   Glean Salvo, NP  vitamin C (ASCORBIC ACID) 250 MG tablet Take 2 tablets (500 mg total) by mouth daily. Patient taking differently: Take 250 mg by mouth daily. 04/10/22   Rickard Patience, MD      Allergies    Trulicity [dulaglutide]    Review of Systems   Review of Systems  Neurological:  Positive for weakness.  All other systems reviewed and are negative.   Physical Exam Updated Vital Signs BP 124/69   Pulse 95   Temp 97.9 F (36.6 C) (Oral)   Resp 17   Ht 5\' 9"  (1.753 m)   Wt 98.4 kg   LMP 06/19/2023   SpO2 100%   BMI 32.05 kg/m  Physical Exam Vitals and nursing note reviewed.  Constitutional:      General: She is not in acute distress.    Appearance: She is well-developed.  HENT:     Head: Normocephalic and atraumatic.  Eyes:     Conjunctiva/sclera: Conjunctivae normal.  Cardiovascular:     Rate and Rhythm: Normal rate and regular rhythm.     Heart sounds: No murmur  heard. Pulmonary:     Effort: Pulmonary effort is normal. No respiratory distress.     Breath sounds: Normal breath sounds. No wheezing or rales.  Abdominal:     Palpations: Abdomen is soft.     Tenderness: There is no abdominal tenderness.  Musculoskeletal:        General: No swelling.     Cervical back: Neck supple.  Skin:    General: Skin is warm and dry.     Capillary Refill: Capillary refill takes less than 2 seconds.     Coloration: Skin is not pale.  Neurological:     Mental Status: She is alert.  Psychiatric:  Mood and Affect: Mood normal.     ED Results / Procedures / Treatments   Labs (all labs ordered are listed, but only abnormal results are displayed) Labs Reviewed  COMPREHENSIVE METABOLIC PANEL WITH GFR - Abnormal; Notable for the following components:      Result Value   CO2 20 (*)    Glucose, Bld 131 (*)    BUN 23 (*)    Creatinine, Ser 1.16 (*)    Calcium 8.4 (*)    Total Protein 6.1 (*)    Albumin 3.0 (*)    All other components within normal limits  CBC - Abnormal; Notable for the following components:   RBC 2.39 (*)    Hemoglobin 6.6 (*)    HCT 19.4 (*)    RDW 18.6 (*)    All other components within normal limits  HCG, SERUM, QUALITATIVE  TYPE AND SCREEN  PREPARE RBC (CROSSMATCH)    EKG None  Radiology No results found.  Procedures Procedures    Medications Ordered in ED Medications  0.9 %  sodium chloride infusion (Manually program via Guardrails IV Fluids) (has no administration in time range)    ED Course/ Medical Decision Making/ A&P                                 Medical Decision Making Amount and/or Complexity of Data Reviewed Labs: ordered.  Risk Prescription drug management.   This patient presents to the ED for concern of weakness.  Differential diagnosis includes dehydration, viral URI, anemia, heavy vaginal bleeding   Lab Tests:  I Ordered, and personally interpreted labs.  The pertinent results  include: CBC with hemoglobin down to 6.6 requiring transfusion, CMP largely at baseline, hCG negative   Medicines ordered and prescription drug management:  I ordered medication including RBCs for symptomatic anemia less than 7 Reevaluation of the patient after these medicines showed that the patient improved I have reviewed the patients home medicines and have made adjustments as needed   Problem List / ED Course:  Patient presents to the emergency department today with concerns of generalized weakness.  Patient has a history significant for anemia, type 2 diabetes, chronic pain syndrome.  She reports that she has been on her menstrual cycle over the last 2 days and has been bleeding heavily.  She reports that she currently follows with OB/GYN for these heavy periods but is not currently on any contraceptive to aid with this.  Denies any focal abdominal tenderness, urinary symptoms, chest pain, shortness of breath.  No recent fever chills or bodyaches. Reassessment, patient currently receiving blood products.  Advised patient that blood products will be administered slowly over the next several hours and once blood products are finished, patient can be discharged home safely.  After discussion about possibly starting OCP to help with vaginal bleeding, patient in agreement with short course of this medication.  Will send a prescription for Megace.  Patient otherwise stable at this time and set to discharge pending plans for outpatient follow-up.   Final Clinical Impression(s) / ED Diagnoses Final diagnoses:  None    Rx / DC Orders ED Discharge Orders     None

## 2023-06-21 NOTE — ED Provider Notes (Signed)
 [place] EMERGENCY DEPARTMENT AT [facility] Provider Note   CSN: [id number] Arrival date & time: 06/21/23  1743     History Chief Complaint  Patient presents with   Weakness    Melinda Stone is a 43 y.o. female.  Patient with past history significant for anemia, type 2 diabetes, iron deficiency anemia, chronic pain syndrome presents to the emergency department today with concerns of weakness.  She states that she has been feeling weak, dizzy, and is concerned due to her history of low hemoglobin.  She states that she has not had any bloody stools but of note is currently on her menstrual cycle.  States that her period started about 2 days ago and has had heavy bleeding.  States that this is somewhat typical for her and has notably worsened in the last year with more persistent bleeding with cycles.  She has been following with OB/GYN but she states she is not currently on any birth control.  Not on any blood thinners. Denies any rectal bleeding, melena, or hematemesis.   Weakness      Home Medications Prior to Admission medications   Medication Sig Start Date End Date Taking? Authorizing Provider  medroxyPROGESTERone (PROVERA) 5 MG tablet Take 4 tablets (20 mg total) by mouth in the morning, at noon, and at bedtime for 7 days. 06/22/23 06/29/23 Yes Smitty Knudsen, PA-C  acetaminophen (TYLENOL) 325 MG tablet Take 1-2 tablets (325-650 mg total) by mouth every 4 (four) hours as needed for mild pain (pain score 1-3). 05/29/23   Setzer, Lynnell Jude, PA-C  albuterol (VENTOLIN HFA) 108 (90 Base) MCG/ACT inhaler Inhale 2 puffs into the lungs every 6 (six) hours as needed for wheezing or shortness of breath. 05/29/23   Setzer, Lynnell Jude, PA-C  amitriptyline (ELAVIL) 25 MG tablet Take 1 tablet (25 mg total) by mouth at bedtime. 01/31/23   Glean Salvo, NP  Blood Glucose Monitoring Suppl (ONETOUCH VERIO FLEX SYSTEM) w/Device KIT 3 (three) times daily. 06/14/23   [provider]  celecoxib (CELEBREX) 100 MG capsule Take 100 mg by mouth 2 (two) times daily as needed. 06/13/23   [provider]  cholecalciferol (VITAMIN D3) 25 MCG (1000 UNIT) tablet Take 1,000 Units by mouth daily.    [provider]  Continuous Glucose Sensor (DEXCOM G7 SENSOR) MISC 1 Device by Does not apply route as directed. Change sensor every 10 days 06/21/23   Shamleffer, Konrad Dolores, MD  Cyanocobalamin (VITAMIN B-12) 2500 MCG SUBL Place 1 tablet (2,500 mcg total) under the tongue daily. 04/23/23   Rickard Patience, MD  cyclobenzaprine (FLEXERIL) 5 MG tablet Take 1 tablet (5 mg total) by mouth 3 (three) times daily as needed for muscle spasms. 05/29/23   Setzer, Lynnell Jude, PA-C  diclofenac Sodium (VOLTAREN) 1 % GEL Apply 2 g topically 3 (three) times daily. 05/29/23   Setzer, Lynnell Jude, PA-C  ferrous sulfate 325 (65 FE) MG tablet Take 1 tablet (325 mg total) by mouth daily with breakfast. 05/29/23   Setzer, Lynnell Jude, PA-C  gabapentin (NEURONTIN) 400 MG capsule Take 1 capsule (400 mg total) by mouth every 8 (eight) hours. Patient not taking: Reported on 06/21/2023 05/29/23   Milinda Antis, PA-C  gabapentin (NEURONTIN) 800 MG tablet Take 800 mg by mouth 3 (three) times daily.    [provider]  glipiZIDE (GLUCOTROL) 10 MG tablet Take 2 tablets (20 mg total) by mouth daily before breakfast AND 1 tablet (10 mg total)  daily before supper. 06/21/23   Shamleffer, Konrad Dolores, MD  insulin glargine (LANTUS SOLOSTAR) 100 UNIT/ML Solostar Pen Inject 40 Units into the skin daily before breakfast. 06/21/23   Shamleffer, Konrad Dolores, MD  Insulin Pen Needle 32G X 4 MM MISC 1 Device by Does not apply route daily in the afternoon. 06/21/23   Shamleffer, Konrad Dolores, MD  Lancets [facility] DELICA PLUS [place]) MISC Apply topically 3 (three) times daily. 06/14/23   [provider]  metoprolol tartrate (LOPRESSOR) 25 MG tablet Take 1 tablet (25 mg total) by mouth 2 (two) times daily. 05/29/23  06/28/23  Setzer, Lynnell Jude, PA-C  mometasone-formoterol (DULERA) 100-5 MCG/ACT AERO Inhale 2 puffs into the lungs 2 (two) times daily at 10 AM and 5 PM. 05/29/23   Setzer, Lynnell Jude, PA-C  Multiple Vitamin (MULTIVITAMIN WITH MINERALS) TABS tablet Take 1 tablet by mouth daily. 05/29/23   Setzer, Lynnell Jude, PA-C  ofloxacin (OCUFLOX) 0.3 % ophthalmic solution Place 1 drop into the left eye 4 (four) times daily. 06/19/23   [provider]  [facility] VERIO test strip 3 (three) times daily. 06/14/23   [provider]  oxyCODONE-acetaminophen (PERCOCET/ROXICET) 5-325 MG tablet Take 1 tablet by mouth every 6 (six) hours as needed.    [provider]  pantoprazole (PROTONIX) 40 MG tablet Take 1 tablet (40 mg total) by mouth daily. 05/29/23   Setzer, Lynnell Jude, PA-C  prednisoLONE acetate (PRED FORTE) 1 % ophthalmic suspension Place 1 drop into the left eye 4 (four) times daily. 06/19/23   [provider]  tiZANidine (ZANAFLEX) 4 MG tablet SMARTSIG:1 Tablet(s) By Mouth Every 12 Hours PRN 06/10/23   [provider]  Ubrogepant (UBRELVY) 100 MG TABS Take 1 tablet (100 mg total) by mouth as needed. Take 1 tablet at onset of headache, may repeat in 2 hours if needed. Max is 200 mg in 24 hours. 01/31/23   Glean Salvo, NP  vitamin C (ASCORBIC ACID) 250 MG tablet Take 2 tablets (500 mg total) by mouth daily. Patient taking differently: Take 250 mg by mouth daily. 04/10/22   Rickard Patience, MD      Allergies    Trulicity [dulaglutide]    Review of Systems   Review of Systems  Neurological:  Positive for weakness.  All other systems reviewed and are negative.   Physical Exam Updated Vital Signs BP 124/69   Pulse 95   Temp 97.9 F (36.6 C) (Oral)   Resp 17   Ht 5\' 9"  (1.753 m)   Wt 98.4 kg   LMP 06/19/2023   SpO2 100%   BMI 32.05 kg/m  Physical Exam Vitals and nursing note reviewed.  Constitutional:      General: She is not in acute distress.    Appearance: She is  well-developed.  HENT:     Head: Normocephalic and atraumatic.  Eyes:     Conjunctiva/sclera: Conjunctivae normal.  Cardiovascular:     Rate and Rhythm: Normal rate and regular rhythm.     Heart sounds: No murmur heard. Pulmonary:     Effort: Pulmonary effort is normal. No respiratory distress.     Breath sounds: Normal breath sounds. No wheezing or rales.  Abdominal:     Palpations: Abdomen is soft.     Tenderness: There is no abdominal tenderness.  Musculoskeletal:        General: No swelling.     Cervical back: Neck supple.  Skin:    General: Skin is warm and dry.  Capillary Refill: Capillary refill takes less than 2 seconds.     Coloration: Skin is not pale.  Neurological:     Mental Status: She is alert.  Psychiatric:        Mood and Affect: Mood normal.     ED Results / Procedures / Treatments   Labs (all labs ordered are listed, but only abnormal results are displayed) Labs Reviewed  COMPREHENSIVE METABOLIC PANEL WITH GFR - Abnormal; Notable for the following components:      Result Value   CO2 20 (*)    Glucose, Bld 131 (*)    BUN 23 (*)    Creatinine, Ser 1.16 (*)    Calcium 8.4 (*)    Total Protein 6.1 (*)    Albumin 3.0 (*)    All other components within normal limits  CBC - Abnormal; Notable for the following components:   RBC 2.39 (*)    Hemoglobin 6.6 (*)    HCT 19.4 (*)    RDW 18.6 (*)    All other components within normal limits  HCG, SERUM, QUALITATIVE  TYPE AND SCREEN  PREPARE RBC (CROSSMATCH)    EKG None  Radiology No results found.  Procedures Procedures    Medications Ordered in ED Medications  0.9 %  sodium chloride infusion (Manually program via Guardrails IV Fluids) (has no administration in time range)    ED Course/ Medical Decision Making/ A&P                                 Medical Decision Making Amount and/or Complexity of Data Reviewed Labs: ordered.  Risk Prescription drug management.   This patient  presents to the ED for concern of weakness.  Differential diagnosis includes dehydration, viral URI, anemia, heavy vaginal bleeding   Lab Tests:  I Ordered, and personally interpreted labs.  The pertinent results include: CBC with hemoglobin down to 6.6 requiring transfusion, CMP largely at baseline, hCG negative   Medicines ordered and prescription drug management:  I ordered medication including RBCs for symptomatic anemia less than 7 Reevaluation of the patient after these medicines showed that the patient improved I have reviewed the patients home medicines and have made adjustments as needed   Problem List / ED Course:  Patient presents to the emergency department today with concerns of generalized weakness.  Patient has a history significant for anemia, type 2 diabetes, chronic pain syndrome.  She reports that she has been on her menstrual cycle over the last 2 days and has been bleeding heavily.  She reports that she currently follows with OB/GYN for these heavy periods but is not currently on any contraceptive to aid with this.  Denies any focal abdominal tenderness, urinary symptoms, chest pain, shortness of breath.  No recent fever chills or bodyaches. Reassessment, patient currently receiving blood products.  Advised patient that blood products will be administered slowly over the next several hours and once blood products are finished, patient can be discharged home safely.  After discussion about possibly starting OCP to help with vaginal bleeding, patient in agreement with short course of this medication.  Will send a prescription for Provera.  Patient otherwise stable at this time and set to discharge pending plans for outpatient follow-up.   Final Clinical Impression(s) / ED Diagnoses Final diagnoses:  Symptomatic anemia  Abnormal uterine bleeding    Rx / DC Orders ED Discharge Orders  Ordered    medroxyPROGESTERone (PROVERA) 5 MG tablet  3 times daily         06/22/23 0004              Smitty Knudsen, PA-C 06/22/23 0012    Lonell Grandchild, MD 06/24/23 [phone]

## 2023-06-21 NOTE — ED Triage Notes (Signed)
 Pt states feeling weak and dizzy since yesterday. Hx of low hbg. Denies bloody stools. Pt states she started her period 2 days ago and has heavy bleeding. Clots. Pt changes pad every 3-4 hours

## 2023-06-21 NOTE — Progress Notes (Signed)
 Name: Melinda Stone  Age/ Sex: 43 y.o., female   MRN/ DOB: [id number], 03/05/1981     PCP: Anselm Jungling, NP   Reason for Endocrinology Evaluation: Type 2 Diabetes Mellitus  Initial Endocrine Consultative Visit: 12/03/2018    PATIENT IDENTIFIER: Melinda Stone is a 43 y.o. female with a past medical history of T2DM. The patient has followed with Endocrinology clinic since 12/03/2018 for consultative assistance with management of her diabetes.  DIABETIC HISTORY:  Melinda Stone was diagnosed with DMat age 89. Metformin had caused GI issues and hypoglycemia. Her hemoglobin A1c has ranged from 7.0% in 04/2018, peaking at 7.8% in 10/2018  On her initial visit to our clinic she had an A1c of 7.8%. She was on Jardiance, she had a prescription for Trulicity and Jardiance were too costly for her. We added glipizide .    Atteted to  [facility] 09/2020 but she did not pick it up  Trulicity started 2022 but by 08/2021 she developed GI side effects and we stopped  Developed abdominal pain to Ozempic 09/2022  Jardiance discontinued due to DKA in 2024   She was discharged on insulin after hospitalization for complicated pneumonia requiring intubation and ICU admission 04/2023  SUBJECTIVE:   During the last visit (10/16/2022): A1c 7.9% .      Today (06/21/2023): Melinda Stone is here for a follow up on diabetes.  She checks her blood sugars occasionally . The patient has had hypoglycemic episodes since the last clinic visit.    Patient was hospitalized in February, 2025 with influenza and pneumonia, her course was complicated by shortness of breath, requiring intubation and ICU admission.  She continues to receive B12 injections through oncology due to B12 deficiency as well as chronic anemia  She had left eye retinal sx yesterday She has chronic abdominal pain but no nausea or vomiting  Denies constipation or diarrhea    HOME DIABETES REGIMEN:  Glipizide 10 mg, 2 tabs  before breakfast and 1 tab before Supper  Lantus 40 units daily    METER DOWNLOAD SUMMARY: 3/6-06/21/2023 Fingerstick Blood Glucose Tests = 10 Average Number Tests/Day = 0 Overall Mean FS Glucose = 193 Standard Deviation = 59  BG Ranges: Low = 119 High = 316  BG Target % Results: % In target = 50 % Over target = 50 % Under target = 0  Hypoglycemic Events/30 Days: BG < 50 = 0 Episodes of symptomatic severe hypoglycemia = 0      DIABETIC COMPLICATIONS: Microvascular complications:   B/L retinopathy , S/P injections  Denies: CKD, neuropathy  Last eye exam: Completed 08/2021   Macrovascular complications:    Denies: CAD, PVD, CVA   HISTORY:  Past Medical History:  Past Medical History:  Diagnosis Date   Anemia    Arthritis 08/17/2020   Chronic back pain    Diabetes mellitus without complication (HCC)    type 2   GERD (gastroesophageal reflux disease)    Migraine    Ovarian cyst    Pneumonia    Polyneuropathy    Past Surgical History:  Past Surgical History:  Procedure Laterality Date   CESAREAN SECTION     CHOLECYSTECTOMY  2004   LUMBAR LAMINECTOMY N/A 05/30/2020   Procedure: Lumbar five Laminectomy,  Bilateral Microdiscectomy, Left Lumbar five -Sacral one Microdiscectomy;  Surgeon: [name], MD;  Location: [facility] OR;  Service: Orthopedics;  Laterality: N/A;   LUMBAR LAMINECTOMY Left 11/14/2020   Procedure: LEFT LUMBAR FOUR-FIVE MICRODISCECTOMY;  Surgeon:  [name], MD;  Location[facility];  Service: Orthopedics;  Laterality: Left;   TUBAL LIGATION  10/19/2010   Social History:  reports that she has been smoking cigars. She has never used smokeless tobacco. She reports that she does not currently use alcohol. She reports that she does not currently use drugs. Family History:  Family History  Problem Relation Age of Onset   Diabetes Mother    Hypertension Mother    Stroke Mother      HOME MEDICATIONS: Allergies as of 06/21/2023       Reactions    Trulicity [dulaglutide] Diarrhea        Medication List        Accurate as of June 21, 2023  8:21 AM. If you have any questions, ask your nurse or doctor.          acetaminophen 325 MG tablet Commonly known as: TYLENOL Take 1-2 tablets (325-650 mg total) by mouth every 4 (four) hours as needed for mild pain (pain score 1-3).   amitriptyline 25 MG tablet Commonly known as: ELAVIL Take 1 tablet (25 mg total) by mouth at bedtime.   celecoxib 100 MG capsule Commonly known as: CELEBREX Take 100 mg by mouth 2 (two) times daily as needed.   cholecalciferol 25 MCG (1000 UNIT) tablet Commonly known as: VITAMIN D3 Take 1,000 Units by mouth daily.   cyclobenzaprine 5 MG tablet Commonly known as: FLEXERIL Take 1 tablet (5 mg total) by mouth 3 (three) times daily as needed for muscle spasms.   diclofenac Sodium 1 % Gel Commonly known as: VOLTAREN Apply 2 g topically 3 (three) times daily.   Dulera 100-5 MCG/ACT Aero Generic drug: mometasone-formoterol Inhale 2 puffs into the lungs 2 (two) times daily at 10 AM and 5 PM.   FeroSul 325 (65 Fe) MG tablet Generic drug: ferrous sulfate Take 1 tablet (325 mg total) by mouth daily with breakfast.   gabapentin 800 MG tablet Commonly known as: NEURONTIN Take 800 mg by mouth 3 (three) times daily.   gabapentin 400 MG capsule Commonly known as: NEURONTIN Take 1 capsule (400 mg total) by mouth every 8 (eight) hours.   Lantus SoloStar 100 UNIT/ML Solostar Pen Generic drug: insulin glargine Inject 40 Units into the skin daily before breakfast.   metoprolol tartrate 25 MG tablet Commonly known as: LOPRESSOR Take 1 tablet (25 mg total) by mouth 2 (two) times daily.   multivitamin with minerals Tabs tablet Take 1 tablet by mouth daily.   ofloxacin 0.3 % ophthalmic solution Commonly known as: OCUFLOX Place 1 drop into the left eye 4 (four) times daily.   OneTouch Delica Plus Lancet33G Misc Apply topically 3 (three) times  daily.   OneTouch Verio Flex System w/Device Kit 3 (three) times daily.   OneTouch Verio test strip Generic drug: glucose blood 3 (three) times daily.   oxyCODONE-acetaminophen 5-325 MG tablet Commonly known as: PERCOCET/ROXICET Take 1 tablet by mouth every 6 (six) hours as needed.   pantoprazole 40 MG tablet Commonly known as: PROTONIX Take 1 tablet (40 mg total) by mouth daily.   prednisoLONE acetate 1 % ophthalmic suspension Commonly known as: PRED FORTE Place 1 drop into the left eye 4 (four) times daily.   tiZANidine 4 MG tablet Commonly known as: ZANAFLEX SMARTSIG:1 Tablet(s) By Mouth Every 12 Hours PRN   Ubrelvy 100 MG Tabs Generic drug: Ubrogepant Take 1 tablet (100 mg total) by mouth as needed. Take 1 tablet at onset of headache, may  repeat in 2 hours if needed. Max is 200 mg in 24 hours.   Ventolin HFA 108 (90 Base) MCG/ACT inhaler Generic drug: albuterol Inhale 2 puffs into the lungs every 6 (six) hours as needed for wheezing or shortness of breath.   Vitamin B-12 2500 MCG Subl Place 1 tablet (2,500 mcg total) under the tongue daily.   vitamin C 250 MG tablet Commonly known as: ASCORBIC ACID Take 2 tablets (500 mg total) by mouth daily. What changed: how much to take         OBJECTIVE:   Vital Signs: BP 136/84 (BP Location: Left Arm, Patient Position: Sitting, Cuff Size: Normal)   Pulse 100   Ht 5\' 9"  (1.753 m)   Wt 217 lb (98.4 kg)   LMP 05/13/2023   SpO2 98%   BMI 32.05 kg/m   Wt Readings from Last 3 Encounters:  06/21/23 217 lb (98.4 kg)  06/14/23 218 lb 14.4 oz (99.3 kg)  06/11/23 218 lb (98.9 kg)     Exam: General: Pt appears drowsy, c/o arthralgia   Lungs: Clear with good BS bilat   Heart: RRR   Extremities: No  pretibial edema.   Neuro: MS is good with appropriate affect, pt is alert and Ox3    DM foot exam: 10/16/2022 The skin of the feet is intact without sores or ulcerations. The pedal pulses are 2+ on right and 2+ on  left. The sensation is absent to a screening 5.07, 10 gram monofilament bilaterally     DATA REVIEWED:  Lab Results  Component Value Date   HGBA1C 5.0 05/11/2023   HGBA1C 7.9 (A) 10/16/2022   HGBA1C 5.6 04/16/2022     Latest Reference Range & Units 06/11/23 14:50  Sodium 135 - 145 mmol/L 133 (L)  Potassium 3.5 - 5.1 mmol/L 4.4  Chloride 98 - 111 mmol/L 105  CO2 22 - 32 mmol/L 21 (L)  Glucose 70 - 99 mg/dL 540 (H)  BUN 6 - 20 mg/dL 20  Creatinine 9.81 - 1.91 mg/dL 4.78 (H)  Calcium 8.9 - 10.3 mg/dL 8.1 (L)  Anion gap 5 - 15  7  Alkaline Phosphatase 38 - 126 U/L 90  Albumin 3.5 - 5.0 g/dL 2.9 (L)  AST 15 - 41 U/L 15  ALT 0 - 44 U/L 13  Total Protein 6.5 - 8.1 g/dL 5.8 (L)  Total Bilirubin 0.0 - 1.2 mg/dL 1.0  GFR, Estimated >29 mL/min >60    ASSESSMENT / PLAN / RECOMMENDATIONS:   1) Type 2 Diabetes Mellitus, With Neuropathic and retinopathic complications - Most recent A1c of 5.0  %. Goal A1c < 7.0 %.     -Historically her A1c skewed due to iron infusions -Intolerant to Trulicity and Ozempic due to abdominal pain -Developed DKA on Jardiance 2024 -She has been on insulin since 04/2023 following hospitalization -No changes at this time -A prescription for Dexcom has been sent to the pharmacy  MEDICATIONS: Continue   glipizide 10 mg 2 tabs Before Breakfast and 1 tablet  before Supper  Continue Lantus 40 units daily     EDUCATION / INSTRUCTIONS: BG monitoring instructions: Patient is instructed to check her blood sugars 2 times a day, fasting and supper time. Call [facility] if: BG persistently < 70  I reviewed the Rule of 15 for the treatment of hypoglycemia in detail with the patient. Literature supplied.    F/U in 3 months    Signed electronically by: Lyndle Herrlich, MD  [facility] End[facility]  [facility] [address], [address] [place], [place] [address] Phone: [phone] FAX:  [phone]   CC: Anselm Jungling, NP [address] [address] [place] [address] Phone: [phone]  Fax: [phone]  Return to Endocrinology clinic as below: Future Appointments  Date Time Provider Department Center  07/16/2023  9:40 AM Tessa Lerner, DO [facility]  08/20/2023  9:45 AM Glean Salvo, NP [facility] None

## 2023-06-22 LAB — TYPE AND SCREEN
ABO/RH(D): A POS
Antibody Screen: NEGATIVE
Unit division: 0

## 2023-06-22 LAB — BPAM RBC
Blood Product Expiration Date: 202505022359
ISSUE DATE / TIME: 202504042234
Unit Type and Rh: 6200

## 2023-06-22 MED ORDER — MEDROXYPROGESTERONE ACETATE 5 MG PO TABS: 20.0000 mg | ORAL_TABLET | Freq: Three times a day (TID) | ORAL | 0 refills | Status: DC

## 2023-06-22 NOTE — Discharge Instructions (Signed)
 You were seen in the ER today for concerns of weakness. Your hemoglobin was very low so you were given a unit of blood to help with this. It appears your anemia is occurring due to heavy menstrual bleeding so I have sent a 1 week prescription of a hormonal contraceptive to help slow down the rate of bleeding you are experiencing. Please take this as prescribed and follow up with your OBGYN.

## 2023-06-24 ENCOUNTER — Encounter: Payer: Self-pay | Admitting: Oncology

## 2023-06-27 ENCOUNTER — Other Ambulatory Visit: Payer: Self-pay | Admitting: Orthopedic Surgery

## 2023-06-28 ENCOUNTER — Encounter: Payer: Self-pay | Admitting: Oncology

## 2023-07-01 ENCOUNTER — Other Ambulatory Visit: Payer: Self-pay | Admitting: Oncology

## 2023-07-02 ENCOUNTER — Telehealth: Payer: Self-pay

## 2023-07-02 ENCOUNTER — Encounter: Payer: Self-pay | Admitting: Oncology

## 2023-07-02 DIAGNOSIS — D509 Iron deficiency anemia, unspecified: Secondary | ICD-10-CM

## 2023-07-02 NOTE — Telephone Encounter (Signed)
 Spoke to pt and informed her of follow up plan.  Please schedule and notify pt of appt:   Lab/ retacrit this week (cbc, hold tube)  3 week: lab/ MD/ retacrit (HH/ hold tube)

## 2023-07-02 NOTE — Telephone Encounter (Signed)
-----   Message from Timmy Forbes sent at 07/01/2023  9:04 PM EDT ----- Please arrange pt to get lab - cbc hold tube + Retacrit  Follow up in 3 weeks lab MD - H&H hold tube + reatcrit.

## 2023-07-03 ENCOUNTER — Inpatient Hospital Stay

## 2023-07-03 ENCOUNTER — Inpatient Hospital Stay: Attending: Oncology

## 2023-07-03 VITALS — BP 145/80 | HR 80

## 2023-07-03 DIAGNOSIS — D519 Vitamin B12 deficiency anemia, unspecified: Secondary | ICD-10-CM

## 2023-07-03 DIAGNOSIS — D509 Iron deficiency anemia, unspecified: Secondary | ICD-10-CM | POA: Diagnosis present

## 2023-07-03 DIAGNOSIS — Z23 Encounter for immunization: Secondary | ICD-10-CM

## 2023-07-03 DIAGNOSIS — E538 Deficiency of other specified B group vitamins: Secondary | ICD-10-CM | POA: Diagnosis not present

## 2023-07-03 LAB — CBC WITH DIFFERENTIAL (CANCER CENTER ONLY)
Abs Immature Granulocytes: 0.06 10*3/uL (ref 0.00–0.07)
Basophils Absolute: 0.1 10*3/uL (ref 0.0–0.1)
Basophils Relative: 1 %
Eosinophils Absolute: 0 10*3/uL (ref 0.0–0.5)
Eosinophils Relative: 0 %
HCT: 23.2 % — ABNORMAL LOW (ref 36.0–46.0)
Hemoglobin: 7.7 g/dL — ABNORMAL LOW (ref 12.0–15.0)
Immature Granulocytes: 1 %
Lymphocytes Relative: 16 %
Lymphs Abs: 1.5 10*3/uL (ref 0.7–4.0)
MCH: 28.1 pg (ref 26.0–34.0)
MCHC: 33.2 g/dL (ref 30.0–36.0)
MCV: 84.7 fL (ref 80.0–100.0)
Monocytes Absolute: 0.4 10*3/uL (ref 0.1–1.0)
Monocytes Relative: 4 %
Neutro Abs: 7.1 10*3/uL (ref 1.7–7.7)
Neutrophils Relative %: 78 %
Platelet Count: 156 10*3/uL (ref 150–400)
RBC: 2.74 MIL/uL — ABNORMAL LOW (ref 3.87–5.11)
RDW: 17.2 % — ABNORMAL HIGH (ref 11.5–15.5)
WBC Count: 9.2 10*3/uL (ref 4.0–10.5)
nRBC: 0 % (ref 0.0–0.2)

## 2023-07-03 LAB — SAMPLE TO BLOOD BANK

## 2023-07-03 MED ORDER — EPOETIN ALFA-EPBX 40000 UNIT/ML IJ SOLN
40000.0000 [IU] | Freq: Once | INTRAMUSCULAR | Status: AC
  Administered 2023-07-03: 40000 [IU] via SUBCUTANEOUS
  Filled 2023-07-03: qty 1

## 2023-07-04 ENCOUNTER — Encounter: Payer: Self-pay | Admitting: Oncology

## 2023-07-06 ENCOUNTER — Ambulatory Visit (HOSPITAL_COMMUNITY)
Admission: EM | Admit: 2023-07-06 | Discharge: 2023-07-06 | Disposition: A | Discharge status: Home / Self Care | Attending: Emergency Medicine | Admitting: Emergency Medicine

## 2023-07-06 ENCOUNTER — Encounter: Payer: Self-pay | Admitting: Oncology

## 2023-07-06 ENCOUNTER — Encounter (HOSPITAL_COMMUNITY): Payer: Self-pay

## 2023-07-06 DIAGNOSIS — R300 Dysuria: Secondary | ICD-10-CM | POA: Insufficient documentation

## 2023-07-06 DIAGNOSIS — Z794 Long term (current) use of insulin: Secondary | ICD-10-CM | POA: Insufficient documentation

## 2023-07-06 DIAGNOSIS — N390 Urinary tract infection, site not specified: Secondary | ICD-10-CM | POA: Insufficient documentation

## 2023-07-06 DIAGNOSIS — N898 Other specified noninflammatory disorders of vagina: Secondary | ICD-10-CM | POA: Diagnosis not present

## 2023-07-06 DIAGNOSIS — E1165 Type 2 diabetes mellitus with hyperglycemia: Secondary | ICD-10-CM | POA: Insufficient documentation

## 2023-07-06 LAB — POCT URINALYSIS DIP (MANUAL ENTRY)
Bilirubin, UA: NEGATIVE
Glucose, UA: 500 mg/dL — AB
Ketones, POC UA: NEGATIVE mg/dL
Nitrite, UA: NEGATIVE
Protein Ur, POC: >=300 mg/dL — AB
Spec Grav, UA: 1.02 (ref 1.010–1.025)
Urobilinogen, UA: 0.2 U/dL
pH, UA: 5 (ref 5.0–8.0)

## 2023-07-06 MED ORDER — CEPHALEXIN 500 MG PO CAPS
500.0000 mg | ORAL_CAPSULE | Freq: Three times a day (TID) | ORAL | 0 refills | Status: DC
Start: 2023-07-06 — End: 2023-07-16

## 2023-07-06 MED ORDER — FLUCONAZOLE 150 MG PO TABS
150.0000 mg | ORAL_TABLET | ORAL | 0 refills | Status: AC
Start: 2023-07-06 — End: 2023-07-14

## 2023-07-06 NOTE — Discharge Instructions (Addendum)
 Start cephalexin  3 times daily.  Make sure you drink plenty fluid.  We will contact you if need to change your antibiotics based on your culture results.  If you have any abdominal pain, pelvic pain, fever, nausea, vomiting you need to be seen immediately.  Take 1 dose of Diflucan  today and a second dose in a week.  It is important that your blood sugars better controlled as I think this is contributing to your symptoms so please make sure that you are limiting carbohydrates and drinking lots of water .  Follow-up with your primary care.

## 2023-07-06 NOTE — ED Triage Notes (Signed)
 Patient states vaginal irritation and itching x 2 days. Patient reports Dysuria x 2 days.

## 2023-07-06 NOTE — ED Provider Notes (Signed)
 [facility]    CSN: [id number] Arrival date & time: 07/06/23  1003      History   Chief Complaint Chief Complaint  Patient presents with   Vaginal Itching   Dysuria    HPI Melinda Stone is a 43 y.o. female.   Patient presents today with a 2-day history of UTI and vaginal irritation symptoms.  She reports mild dysuria with associated frequency and urgency.  She also reports some itching of the vagina.  She has had some mild discharge but denies any associated odor.  She does have a history of diabetes and reports recently she has had some hyperglycemia.  She does not take an SGLT2 inhibitor.  She denies any recent antibiotics.  She denies any recent catheterization or urogenital procedure.  She has no specific concern for STI but is interested in testing on the swab today.  She declined HIV and syphilis testing.  She denies any pelvic pain, fever, vomiting.  She does report some generalized abdominal pain and nausea but reports that this is chronic and at her baseline.    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, with long-term current use of insulin  (HCC) 06/21/2023   Hepatomegaly 06/14/2023   Respiratory failure (HCC) 05/23/2023   Debility 05/21/2023   Type 2 diabetes mellitus with hypoglycemia without coma (HCC) 05/21/2023   Hyperglycemia 05/15/2023   Acute hypoxic respiratory failure (HCC) 05/12/2023   Influenza A 05/12/2023   ARDS (adult respiratory distress syndrome) (HCC) 05/12/2023   On mechanically assisted ventilation (HCC) 05/12/2023   Pneumonia due to infectious organism 05/12/2023   Nausea and vomiting 05/11/2023   CAP  (community acquired pneumonia) 03/26/2023   Sepsis (HCC) 03/26/2023   Cellulitis of fifth toe 03/26/2023   Multifocal pneumonia 03/25/2023   DKA (diabetic ketoacidosis) (HCC) 12/24/2022   DKA, type 2 (HCC) 12/24/2022   Migraine 07/11/2022   Hypokalemia 05/14/2022   Normocytic anemia 05/14/2022   Pneumonitis 05/13/2022   Grade I diastolic dysfunction 05/13/2022   Acute pharyngitis 05/13/2022   Carpal tunnel syndrome 05/13/2022   Pain of breast 05/13/2022   Contusion of upper limb 05/13/2022   Obesity 05/13/2022   Overweight (BMI 25.0-29.9) 05/13/2022   Aspirat pneumonitis due to anesth during preg, first tri 05/05/2022   Need for prophylactic vaccination and inoculation against influenza 02/07/2022   Low serum vitamin B12 01/30/2022   IDA (iron  deficiency anemia) 01/30/2022   Mild intermittent asthma 01/15/2022   Esophageal reflux 01/15/2022   Dyspnea 09/01/2021   Pulmonary nodules 09/01/2021   Absolute anemia 09/01/2021   Tobacco use 09/01/2021   Hypotension 09/01/2021   Nausea & vomiting 06/08/2021   UTI (urinary tract infection) 06/08/2021   Post laminectomy syndrome 04/11/2021   HNP (herniated nucleus pulposus), lumbar 11/14/2020   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   Chronic pain syndrome 06/14/2020   Pharmacologic therapy 06/14/2020   Disorder of skeletal system 06/14/2020   Problems influencing health status 06/14/2020   History of marijuana use 06/14/2020   History of illicit drug use 06/14/2020   Abnormal drug screen (05/25/2020) 06/14/2020  Marijuana use 06/14/2020   Abnormal MRI, lumbar spine (05/16/2020) 06/14/2020   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, L5-S1) 06/14/2020   Lumbar lateral recess stenosis (Left: L4-5, L5-S1) 06/14/2020   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) 06/14/2020   S/P lumbar microdiscectomy 06/07/2020   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   GI bleed 12/01/2019   Muscle spasm 11/06/2019    Sciatica 11/06/2019   Low back pain 02/04/2019   Type 2 diabetes mellitus with diabetic polyneuropathy, without long-term current use of insulin  (HCC) 12/03/2018   Type 2 diabetes mellitus with hyperglycemia, without long-term current use of insulin  (HCC) 05/15/2018   Acute bronchitis 12/01/2014    Past Surgical History:  Procedure Laterality Date   CESAREAN SECTION     CHOLECYSTECTOMY  2004   LUMBAR LAMINECTOMY N/A 05/30/2020   Procedure: Lumbar five Laminectomy,  Bilateral Microdiscectomy, Left Lumbar five -Sacral one Microdiscectomy;  Surgeon: Adah Acron, MD;  Location: [facility] OR;  Service: Orthopedics;  Laterality: N/A;   LUMBAR LAMINECTOMY Left 11/14/2020   Procedure: LEFT LUMBAR FOUR-FIVE MICRODISCECTOMY;  Surgeon: Adah Acron, MD;  Location: [facility];  Service: Orthopedics;  Laterality: Left;   TUBAL LIGATION  10/19/2010    OB History     Gravida  6   Para      Term      Preterm      AB      Living  3      SAB      IAB      Ectopic      Multiple      Live Births               Home Medications    Prior to Admission medications   Medication Sig Start Date End Date Taking? Authorizing Provider  albuterol  (VENTOLIN  HFA) 108 (90 Base) MCG/ACT inhaler Inhale 2 puffs into the lungs every 6 (six) hours as needed for wheezing or shortness of breath. 05/29/23  Yes Setzer, Hamp Levine, PA-C  Blood Glucose Monitoring Suppl (ONETOUCH VERIO FLEX SYSTEM) w/Device KIT 3 (three) times daily. 06/14/23  Yes [provider]  celecoxib  (CELEBREX ) 100 MG capsule Take 100 mg by mouth 2 (two) times daily as needed. 06/13/23  Yes [provider]  cephALEXin  (KEFLEX ) 500 MG capsule Take 1 capsule (500 mg total) by mouth 3 (three) times daily. 07/06/23  Yes Bobby Barton, Betsey Brow, PA-C  cholecalciferol (VITAMIN D3) 25 MCG (1000 UNIT) tablet Take 1,000 Units by mouth daily.   Yes [provider]  Continuous Glucose Sensor (DEXCOM G7 SENSOR) MISC 1 Device by Does not  apply route as directed. Change sensor every 10 days 06/21/23  Yes Shamleffer, Ibtehal Jaralla, MD  Cyanocobalamin  (VITAMIN B-12) 2500 MCG SUBL Place 1 tablet (2,500 mcg total) under the tongue daily. 04/23/23  Yes Timmy Forbes, MD  cyclobenzaprine  (FLEXERIL ) 5 MG tablet Take 1 tablet (5 mg total) by mouth 3 (three) times daily as needed for muscle spasms. 05/29/23  Yes Setzer, Sandra J, PA-C  diclofenac  Sodium (VOLTAREN ) 1 % GEL Apply 2 g topically 3 (three) times daily. 05/29/23  Yes Setzer, Sandra J, PA-C  ferrous sulfate  325 (65 FE) MG tablet Take 1 tablet (325 mg total) by mouth daily with breakfast. 05/29/23  Yes Setzer, Sandra J, PA-C  fluconazole  (DIFLUCAN ) 150 MG tablet Take 1 tablet (150 mg total) by mouth once a week for 2 doses. 07/06/23 07/14/23 Yes Grace Haggart K, PA-C  glipiZIDE  (GLUCOTROL ) 10 MG  tablet Take 2 tablets (20 mg total) by mouth daily before breakfast AND 1 tablet (10 mg total) daily before supper. 06/21/23  Yes Shamleffer, Ibtehal Jaralla, MD  insulin  glargine (LANTUS  SOLOSTAR) 100 UNIT/ML Solostar Pen Inject 40 Units into the skin daily before breakfast. 06/21/23  Yes Shamleffer, Ibtehal Jaralla, MD  Insulin  Pen Needle 32G X 4 MM MISC 1 Device by Does not apply route daily in the afternoon. 06/21/23  Yes Shamleffer, Ibtehal Jaralla, MD  Lancets [facility] DELICA PLUS [place]) MISC Apply topically 3 (three) times daily. 06/14/23  Yes [provider]  mometasone -formoterol  (DULERA ) 100-5 MCG/ACT AERO Inhale 2 puffs into the lungs 2 (two) times daily at 10 AM and 5 PM. 05/29/23  Yes Setzer, Sandra J, PA-C  Multiple Vitamin (MULTIVITAMIN WITH MINERALS) TABS tablet Take 1 tablet by mouth daily. 05/29/23  Yes Setzer, Hamp Levine, PA-C  ofloxacin (OCUFLOX) 0.3 % ophthalmic solution Place 1 drop into the left eye 4 (four) times daily. 06/19/23  Yes [provider]  ONETOUCH VERIO test strip 3 (three) times daily. 06/14/23  Yes [provider]  pantoprazole  (PROTONIX ) 40 MG tablet  Take 1 tablet (40 mg total) by mouth daily. 05/29/23  Yes Setzer, Sandra J, PA-C  prednisoLONE acetate (PRED FORTE) 1 % ophthalmic suspension Place 1 drop into the left eye 4 (four) times daily. 06/19/23  Yes [provider]  tiZANidine  (ZANAFLEX ) 4 MG tablet SMARTSIG:1 Tablet(s) By Mouth Every 12 Hours PRN 06/10/23  Yes [provider]  Ubrogepant  (UBRELVY ) 100 MG TABS Take 1 tablet (100 mg total) by mouth as needed. Take 1 tablet at onset of headache, may repeat in 2 hours if needed. Max is 200 mg in 24 hours. 01/31/23  Yes Wess Hammed, NP  vitamin C  (ASCORBIC ACID ) 250 MG tablet Take 2 tablets (500 mg total) by mouth daily. Patient taking differently: Take 250 mg by mouth daily. 04/10/22  Yes Timmy Forbes, MD  amitriptyline  (ELAVIL ) 25 MG tablet Take 1 tablet (25 mg total) by mouth at bedtime. 01/31/23   Wess Hammed, NP  gabapentin  (NEURONTIN ) 400 MG capsule Take 1 capsule (400 mg total) by mouth every 8 (eight) hours. Patient not taking: Reported on 06/21/2023 05/29/23   Setzer, Sandra J, PA-C  gabapentin  (NEURONTIN ) 800 MG tablet Take 800 mg by mouth 3 (three) times daily.    [provider]  medroxyPROGESTERone  (PROVERA ) 5 MG tablet Take 4 tablets (20 mg total) by mouth in the morning, at noon, and at bedtime for 7 days. 06/22/23 06/29/23  Zelaya, Oscar A, PA-C  metoprolol  tartrate (LOPRESSOR ) 25 MG tablet Take 1 tablet (25 mg total) by mouth 2 (two) times daily. 05/29/23 06/28/23  Setzer, Hamp Levine, PA-C    Family History Family History  Problem Relation Age of Onset   Diabetes Mother    Hypertension Mother    Stroke Mother     Social History Social History   Tobacco Use   Smoking status: Every Day    Types: Cigars    Last attempt to quit: 12/23/2021    Years since quitting: 1.5   Smokeless tobacco: Never   Tobacco comments:    2 cigars daily. Khj 02/12/2023  Vaping Use   Vaping status: Never Used  Substance Use Topics   Alcohol  use: Not Currently   Drug use:  Not Currently    Comment: per patient stopped smoking marijuana Mar 2022     Allergies   Trulicity  [dulaglutide ]   Review of Systems Review  of Systems  Constitutional:  Positive for activity change. Negative for appetite change, fatigue and fever.  Gastrointestinal:  Positive for abdominal pain (Chronic) and nausea. Negative for diarrhea and vomiting.  Genitourinary:  Positive for dysuria, frequency, urgency and vaginal discharge. Negative for flank pain, genital sores, vaginal bleeding and vaginal pain.     Physical Exam Triage Vital Signs ED Triage Vitals  Encounter Vitals Group     BP 07/06/23 1029 107/64     Systolic BP Percentile --      Diastolic BP Percentile --      Pulse Rate 07/06/23 1029 79     Resp 07/06/23 1029 16     Temp 07/06/23 1029 97.9 F (36.6 C)     Temp Source 07/06/23 1029 Oral     SpO2 07/06/23 1029 99 %     Weight --      Height --      Head Circumference --      Peak Flow --      Pain Score 07/06/23 1027 0     Pain Loc --      Pain Education --      Exclude from Growth Chart --    No data found.  Updated Vital Signs BP 107/64 (BP Location: Left Arm)   Pulse 79   Temp 97.9 F (36.6 C) (Oral)   Resp 16   LMP 06/19/2023   SpO2 99%   Visual Acuity Right Eye Distance:   Left Eye Distance:   Bilateral Distance:    Right Eye Near:   Left Eye Near:    Bilateral Near:     Physical Exam Vitals reviewed.  Constitutional:      General: She is awake. She is not in acute distress.    Appearance: Normal appearance. She is well-developed. She is not ill-appearing.     Comments: Very pleasant female appears stated age in no acute distress sitting comfortably in exam room  HENT:     Head: Normocephalic and atraumatic.  Cardiovascular:     Rate and Rhythm: Normal rate and regular rhythm.     Heart sounds: Normal heart sounds, S1 normal and S2 normal. No murmur heard. Pulmonary:     Effort: Pulmonary effort is normal.     Breath sounds:  Normal breath sounds. No wheezing, rhonchi or rales.     Comments: Clear to auscultation bilaterally Abdominal:     General: Bowel sounds are normal.     Palpations: Abdomen is soft.     Tenderness: There is no abdominal tenderness. There is no right CVA tenderness, left CVA tenderness, guarding or rebound.     Comments: Benign abdominal exam  Psychiatric:        Behavior: Behavior is cooperative.      UC Treatments / Results  Labs (all labs ordered are listed, but only abnormal results are displayed) Labs Reviewed  POCT URINALYSIS DIP (MANUAL ENTRY) - Abnormal; Notable for the following components:      Result Value   Clarity, UA cloudy (*)    Glucose, UA =500 (*)    Blood, UA moderate (*)    Protein Ur, POC >=300 (*)    Leukocytes, UA Small (1+) (*)    All other components within normal limits  URINE CULTURE  CERVICOVAGINAL ANCILLARY ONLY    EKG   Radiology No results found.  Procedures Procedures (including critical care time)  Medications Ordered in UC Medications - No data to display  Initial Impression / Assessment  and Plan / UC Course  I have reviewed the triage vital signs and the nursing notes.  Pertinent labs & imaging results that were available during my care of the patient were reviewed by me and considered in my medical decision making (see chart for details).     Patient is well-appearing, afebrile, nontoxic, nontachycardic.  Vital signs a physical exam are reassuring with no indication for emergent evaluation or imaging.  UA showed glucosuria with blood and leukocyte esterase.  Her blood sugar was 291 on her continuous glucose monitor.  We discussed that glucosuria could be contributing to her symptoms and so it is important that she pushes fluids and adjust her medication and diet to improve her blood sugar as directed by her PCP.  Given her constellation of symptoms and abnormal urine will cover for UTI with cephalexin  500 mg 3 times daily for 5 days.   No indication for dose adjustment based on metabolic panel from 06/21/2023 with creatinine of 1.16 and calculated creatinine clearance of 99.17 milliliters per minute.  Will send her urine for culture and contact her if we need to change her antibiotics based on her culture results.  We discussed that is also possible that her hypoglycemia has caused yeast infection and that is causing her abnormal UA, however, we will treat for both given she is experiencing both vaginal and UTI symptoms.  1 dose of Diflucan  is to be taken today and then a second dose was prescribed to be taken in 1 week as antibiotics could also trigger recurrent yeast infection.  Recommend close follow-up with her primary care.  We discussed that if she has any worsening or changing symptoms including abdominal pain that is changing from her baseline, fever, nausea, vomiting, weakness she needs to go to the ER.  Strict return precautions given.  All questions were answered to patient satisfaction.  Final Clinical Impressions(s) / UC Diagnoses   Final diagnoses:  Acute UTI  Dysuria  Vaginal irritation  Type 2 diabetes mellitus with hyperglycemia, with long-term current use of insulin  [facility])     Discharge Instructions      Start cephalexin  3 times daily.  Make sure you drink plenty fluid.  We will contact you if need to change your antibiotics based on your culture results.  If you have any abdominal pain, pelvic pain, fever, nausea, vomiting you need to be seen immediately.  Take 1 dose of Diflucan  today and a second dose in a week.  It is important that your blood sugars better controlled as I think this is contributing to your symptoms so please make sure that you are limiting carbohydrates and drinking lots of water .  Follow-up with your primary care.       ED Prescriptions     Medication Sig Dispense Auth. Provider   cephALEXin  (KEFLEX ) 500 MG capsule Take 1 capsule (500 mg total) by mouth 3 (three) times daily. 15 capsule  Chevy Virgo K, PA-C   fluconazole  (DIFLUCAN ) 150 MG tablet Take 1 tablet (150 mg total) by mouth once a week for 2 doses. 2 tablet Aleta Manternach K, PA-C      PDMP not reviewed this encounter.   Budd Cargo, PA-C 07/06/23 1125

## 2023-07-08 LAB — CERVICOVAGINAL ANCILLARY ONLY
Bacterial Vaginitis (gardnerella): POSITIVE — AB
Candida Glabrata: NEGATIVE
Candida Vaginitis: NEGATIVE
Chlamydia: NEGATIVE
Comment: NEGATIVE
Comment: NEGATIVE
Comment: NEGATIVE
Comment: NEGATIVE
Comment: NEGATIVE
Comment: NORMAL
Neisseria Gonorrhea: NEGATIVE
Trichomonas: NEGATIVE

## 2023-07-09 LAB — URINE CULTURE: Culture: >=100000 — AB

## 2023-07-10 ENCOUNTER — Encounter: Payer: Self-pay | Admitting: Oncology

## 2023-07-10 ENCOUNTER — Telehealth (HOSPITAL_COMMUNITY): Payer: Self-pay

## 2023-07-10 MED ORDER — METRONIDAZOLE 0.75 % VA GEL: 1.0000 | Freq: Every day | VAGINAL | 0 refills | Status: AC

## 2023-07-10 MED ORDER — NITROFURANTOIN MONOHYD MACRO 100 MG PO CAPS: 100.0000 mg | ORAL_CAPSULE | Freq: Two times a day (BID) | ORAL | 0 refills | Status: DC

## 2023-07-10 NOTE — Telephone Encounter (Signed)
 Per protocol, pt to dc Keflex  and begin treatment with Macrobid .  Per protocol, pt requires tx with metronidazole . Reviewed with patient, verified pharmacy, prescription sent

## 2023-07-16 ENCOUNTER — Encounter: Payer: Self-pay | Admitting: Oncology

## 2023-07-16 ENCOUNTER — Ambulatory Visit: Payer: 59 | Attending: Cardiology | Admitting: Cardiology

## 2023-07-16 ENCOUNTER — Encounter: Payer: Self-pay | Admitting: Cardiology

## 2023-07-16 VITALS — BP 138/80 | HR 82 | Ht 69.0 in | Wt 215.6 lb

## 2023-07-16 DIAGNOSIS — R0602 Shortness of breath: Secondary | ICD-10-CM | POA: Diagnosis not present

## 2023-07-16 DIAGNOSIS — M7989 Other specified soft tissue disorders: Secondary | ICD-10-CM | POA: Diagnosis not present

## 2023-07-16 DIAGNOSIS — Z87891 Personal history of nicotine dependence: Secondary | ICD-10-CM

## 2023-07-16 DIAGNOSIS — Z794 Long term (current) use of insulin: Secondary | ICD-10-CM

## 2023-07-16 DIAGNOSIS — I10 Essential (primary) hypertension: Secondary | ICD-10-CM | POA: Diagnosis not present

## 2023-07-16 DIAGNOSIS — E1165 Type 2 diabetes mellitus with hyperglycemia: Secondary | ICD-10-CM | POA: Diagnosis not present

## 2023-07-16 DIAGNOSIS — D508 Other iron deficiency anemias: Secondary | ICD-10-CM

## 2023-07-16 DIAGNOSIS — R809 Proteinuria, unspecified: Secondary | ICD-10-CM

## 2023-07-16 LAB — BASIC METABOLIC PANEL WITH GFR
BUN/Creatinine Ratio: 20 (ref 9–23)
BUN: 20 mg/dL (ref 6–24)
CO2: 14 mmol/L — ABNORMAL LOW (ref 20–29)
Calcium: 8 mg/dL — ABNORMAL LOW (ref 8.7–10.2)
Chloride: 113 mmol/L — ABNORMAL HIGH (ref 96–106)
Creatinine, Ser: 1.01 mg/dL — ABNORMAL HIGH (ref 0.57–1.00)
Glucose: 241 mg/dL — ABNORMAL HIGH (ref 70–99)
Potassium: 4.9 mmol/L (ref 3.5–5.2)
Sodium: 139 mmol/L (ref 134–144)
eGFR: 72 mL/min/{1.73_m2} (ref >59)

## 2023-07-16 LAB — PRO B NATRIURETIC PEPTIDE: NT-Pro BNP: 1695 pg/mL — ABNORMAL HIGH (ref 0–130)

## 2023-07-16 MED ORDER — LOSARTAN POTASSIUM 25 MG PO TABS: 25.0000 mg | ORAL_TABLET | Freq: Every day | ORAL | 3 refills | Status: DC

## 2023-07-16 NOTE — Patient Instructions (Signed)
 Medication Instructions:  Your physician has recommended you make the following change in your medication:   START Losartan 25 mg once daily in the evening    *If you need a refill on your cardiac medications before your next appointment, please call your pharmacy*  Lab Work: To be completed today: BMP and Pro-BNP  To be completed in 1 week: BMP  If you have labs (blood work) drawn today and your tests are completely normal, you will receive your results only by: MyChart Message (if you have MyChart) OR A paper copy in the mail If you have any lab test that is abnormal or we need to change your treatment, we will call you to review the results.  Testing/Procedures: None ordered today.  Follow-Up: At [facility], you and your health needs are our priority.  As part of our continuing mission to provide you with exceptional heart care, we have created designated Provider Care Teams.  These Care Teams include your primary Cardiologist (physician) and Advanced Practice Providers (APPs -  Physician Assistants and Nurse Practitioners) who all work together to provide you with the care you need, when you need it.  We recommend signing up for the patient portal called "MyChart".  Sign up information is provided on this After Visit Summary.  MyChart is used to connect with patients for Virtual Visits (Telemedicine).  Patients are able to view lab/test results, encounter notes, upcoming appointments, etc.  Non-urgent messages can be sent to your provider as well.   To learn more about what you can do with MyChart, go to [email or web address].    Your next appointment:   6 month(s)  The format for your next appointment:   In Person  Provider:   Olinda Bertrand, [facility]

## 2023-07-16 NOTE — Progress Notes (Signed)
 Cardiology Office Note:     NAME:  Melinda Stone    MRN: [id number] DOB:  1980/09/01   PCP:  Marcial Setting, NP  Former Cardiology Providers: None Primary Cardiologist:  Olinda Bertrand, DO, [facility] (established care 07/16/2023) Electrophysiologist:  None   Referring MD: Marcial Setting, NP  Reason of Consult: Elevated BNP and lower extremity swelling.  Chief Complaint  Patient presents with   Shortness of Breath   New Patient (Initial Visit)    History of Present Illness:    Melinda Stone is a 43 y.o. African-American female whose past medical history and cardiovascular risk factors includes: Hypertension,  insulin -dependent diabetes mellitus type 2, iron  deficient anemia, hx of cigar smoking. She is being seen today for the evaluation of lower extremity swelling and elevated BNP at the request of Marcial Setting, NP.  Patient is referred to the practice for evaluation of lower extremity swelling and elevated BNP.  She had BNP levels checked on 05/11/2023 which was reported to be 1082.  Patient denies anginal chest pain or tightness.  But does experience shortness of breath at rest and with effort related activities.  She chronically has 3 pillow orthopnea, denies PND.  She does endorse having lower extremity swelling which led her to the hospital back in December but also states that the swelling resolves by the time she wakes up in the morning.  EMR reports history of hypertension and hyperlipidemia the patient refuses the accuracy.  In addition, she is a diabetic on insulin  therapy but not on ARB for renal protection.  Office and home blood pressures are not well-controlled.  Current Medications: Current Meds  Medication Sig   albuterol  (VENTOLIN  HFA) 108 (90 Base) MCG/ACT inhaler Inhale 2 puffs into the lungs every 6 (six) hours as needed for wheezing or shortness of breath.   amitriptyline  (ELAVIL ) 25 MG tablet Take 1 tablet (25 mg total) by mouth at bedtime.    Blood Glucose Monitoring Suppl (ONETOUCH VERIO FLEX SYSTEM) w/Device KIT 3 (three) times daily.   Continuous Glucose Sensor (DEXCOM G7 SENSOR) MISC 1 Device by Does not apply route as directed. Change sensor every 10 days   Cyanocobalamin  (VITAMIN B-12) 2500 MCG SUBL Place 1 tablet (2,500 mcg total) under the tongue daily.   cyclobenzaprine  (FLEXERIL ) 5 MG tablet Take 1 tablet (5 mg total) by mouth 3 (three) times daily as needed for muscle spasms. (Patient not taking: Reported on 07/18/2023)   diclofenac  Sodium (VOLTAREN ) 1 % GEL Apply 2 g topically 3 (three) times daily. (Patient not taking: Reported on 07/18/2023)   ferrous sulfate  325 (65 FE) MG tablet Take 1 tablet (325 mg total) by mouth daily with breakfast.   gabapentin  (NEURONTIN ) 800 MG tablet Take 800 mg by mouth 3 (three) times daily.   glipiZIDE  (GLUCOTROL ) 10 MG tablet Take 2 tablets (20 mg total) by mouth daily before breakfast AND 1 tablet (10 mg total) daily before supper.   insulin  glargine (LANTUS  SOLOSTAR) 100 UNIT/ML Solostar Pen Inject 40 Units into the skin daily before breakfast.   Insulin  Pen Needle 32G X 4 MM MISC 1 Device by Does not apply route daily in the afternoon.   Lancets (ONETOUCH DELICA PLUS LANCET33G) MISC Apply topically 3 (three) times daily.   losartan  (COZAAR ) 25 MG tablet Take 1 tablet (25 mg total) by mouth daily.   metoprolol  tartrate (LOPRESSOR ) 25 MG tablet Take 1 tablet (25 mg total) by mouth 2 (two) times daily.   mometasone -formoterol  (DULERA ) 100-5 MCG/ACT  AERO Inhale 2 puffs into the lungs 2 (two) times daily at 10 AM and 5 PM.   Multiple Vitamin (MULTIVITAMIN WITH MINERALS) TABS tablet Take 1 tablet by mouth daily.   ONETOUCH VERIO test strip 3 (three) times daily.   oxyCODONE -acetaminophen  (PERCOCET/ROXICET) 5-325 MG tablet Take 1 tablet by mouth 4 (four) times daily as needed for moderate pain (pain score 4-6) or severe pain (pain score 7-10).   pantoprazole  (PROTONIX ) 40 MG tablet Take 1 tablet (40  mg total) by mouth daily.   tiZANidine  (ZANAFLEX ) 4 MG tablet Take 4 mg by mouth every 12 (twelve) hours as needed for muscle spasms.   Ubrogepant  (UBRELVY ) 100 MG TABS Take 1 tablet (100 mg total) by mouth as needed. Take 1 tablet at onset of headache, may repeat in 2 hours if needed. Max is 200 mg in 24 hours. (Patient not taking: Reported on 07/18/2023)   vitamin C  (ASCORBIC ACID ) 250 MG tablet Take 2 tablets (500 mg total) by mouth daily. (Patient taking differently: Take 250 mg by mouth daily.)   [DISCONTINUED] cholecalciferol (VITAMIN D3) 25 MCG (1000 UNIT) tablet Take 1,000 Units by mouth daily.     Allergies:    Trulicity  [dulaglutide ]   Past Medical History: Past Medical History:  Diagnosis Date   Anemia    Arthritis 08/17/2020   Chronic back pain    Diabetes mellitus without complication (HCC)    type 2   GERD (gastroesophageal reflux disease)    Migraine    Ovarian cyst    Pneumonia    Polyneuropathy     Past Surgical History: Past Surgical History:  Procedure Laterality Date   CESAREAN SECTION     CHOLECYSTECTOMY  2004   LUMBAR LAMINECTOMY N/A 05/30/2020   Procedure: Lumbar five Laminectomy,  Bilateral Microdiscectomy, Left Lumbar five -Sacral one Microdiscectomy;  Surgeon: Adah Acron, MD;  Location: [facility] OR;  Service: Orthopedics;  Laterality: N/A;   LUMBAR LAMINECTOMY Left 11/14/2020   Procedure: LEFT LUMBAR FOUR-FIVE MICRODISCECTOMY;  Surgeon: Adah Acron, MD;  Location: [facility] OR;  Service: Orthopedics;  Laterality: Left;   TUBAL LIGATION  10/19/2010    Social History: Social History   Tobacco Use   Smoking status: Every Day    Types: Cigars    Last attempt to quit: 12/23/2021    Years since quitting: 1.5   Smokeless tobacco: Never   Tobacco comments:    2 cigars daily. Khj 02/12/2023  Vaping Use   Vaping status: Never Used  Substance Use Topics   Alcohol  use: Not Currently   Drug use: Not Currently    Comment: per patient stopped smoking marijuana Mar  2022    Family History: Family History  Problem Relation Age of Onset   Diabetes Mother    Hypertension Mother    Stroke Mother     ROS:   Review of Systems  Cardiovascular:  Positive for dyspnea on exertion and leg swelling (w/ prolong sitting and improves by the morning.). Negative for chest pain, claudication, irregular heartbeat, near-syncope, orthopnea, palpitations, paroxysmal nocturnal dyspnea and syncope.  Respiratory:  Positive for shortness of breath.   Hematologic/Lymphatic: Negative for bleeding problem.    EKGs/Labs/Other Studies Reviewed:   EKG: EKG Interpretation Date/Time:  Tuesday July 16 2023 09:55:48 EDT Ventricular Rate:  82 PR Interval:  186 QRS Duration:  70 QT Interval:  394 QTC Calculation: 460 R Axis:   -11  Text Interpretation: Normal sinus rhythm Possible Left atrial enlargement When compared with ECG of  21-Apr-2023 11:55, Premature supraventricular complexes are no longer Present Confirmed by Olinda Bertrand [phone]) on 07/16/2023 10:04:18 AM  Echocardiogram: 04/2023:  1. Left ventricular ejection fraction, by estimation, is 65 to 70%. The left ventricle has normal function. The left ventricle has no regional wall motion abnormalities. There is moderate left ventricular hypertrophy. Left ventricular diastolic parameters are consistent with Grade I diastolic dysfunction (impaired relaxation).   2. Right ventricular systolic function is normal. The right ventricular size is normal. Tricuspid regurgitation signal is inadequate for assessing PA pressure.   3. The mitral valve is normal in structure. Trivial mitral valve regurgitation. No evidence of mitral stenosis.   4. The aortic valve is tricuspid. Aortic valve regurgitation is not visualized. Aortic valve sclerosis/calcification is present, without any evidence of aortic stenosis.   5. The inferior vena cava is dilated in size with <50% respiratory variability, suggesting right atrial pressure of 15 mmHg.    Labs:    Latest Ref Rng & Units 07/21/2023   10:20 AM 07/03/2023   10:14 AM 06/21/2023    6:20 PM  CBC  WBC 4.0 - 10.5 K/uL 6.4  9.2  8.4   Hemoglobin 12.0 - 15.0 g/dL 6.0  7.7  6.6   Hematocrit 36.0 - 46.0 % 18.2  23.2  19.4   Platelets 150 - 400 K/uL 164  156  184        Latest Ref Rng & Units 07/21/2023   10:20 AM 07/16/2023   10:40 AM 06/21/2023    6:20 PM  BMP  Glucose 70 - 99 mg/dL 604  540  981   BUN 6 - 20 mg/dL 25  20  23    Creatinine 0.44 - 1.00 mg/dL 1.91  4.78  2.95   BUN/Creat Ratio 9 - 23  20    Sodium 135 - 145 mmol/L 136  139  137   Potassium 3.5 - 5.1 mmol/L 4.6  4.9  4.2   Chloride 98 - 111 mmol/L 112  113  111   CO2 22 - 32 mmol/L 16  14  20    Calcium  8.9 - 10.3 mg/dL 8.1  8.0  8.4       Latest Ref Rng & Units 07/21/2023   10:20 AM 07/16/2023   10:40 AM 06/21/2023    6:20 PM  CMP  Glucose 70 - 99 mg/dL 621  308  657   BUN 6 - 20 mg/dL 25  20  23    Creatinine 0.44 - 1.00 mg/dL 8.46  9.62  9.52   Sodium 135 - 145 mmol/L 136  139  137   Potassium 3.5 - 5.1 mmol/L 4.6  4.9  4.2   Chloride 98 - 111 mmol/L 112  113  111   CO2 22 - 32 mmol/L 16  14  20    Calcium  8.9 - 10.3 mg/dL 8.1  8.0  8.4   Total Protein 6.5 - 8.1 g/dL   6.1   Total Bilirubin 0.0 - 1.2 mg/dL   1.2   Alkaline Phos 38 - 126 U/L   102   AST 15 - 41 U/L   15   ALT 0 - 44 U/L   13     Lab Results  Component Value Date   CHOL 96 (L) 04/28/2020   HDL 55 04/28/2020   LDLCALC 23 04/28/2020   TRIG 138 05/16/2023   CHOLHDL 1.7 04/28/2020   No results for input(s): "LIPOA" in the last 8760 hours. No components found for: "NTPROBNP" Recent  Labs    07/16/23 1040  PROBNP 1,695*   Recent Labs    05/11/23 0603  TSH 0.655    Physical Exam:    Today's Vitals   07/16/23 0954  BP: 138/80  Pulse: 82  SpO2: 98%  Weight: 215 lb 9.6 oz (97.8 kg)  Height: 5\' 9"  (1.753 m)   Body mass index is 31.84 kg/m. Wt Readings from Last 3 Encounters:  07/21/23 215 lb (97.5 kg)  07/16/23 215 lb 9.6  oz (97.8 kg)  06/21/23 217 lb (98.4 kg)    Physical Exam  Constitutional: No distress.  hemodynamically stable, appears older than stated age, presents in wheelchair  Neck: No JVD present.  Cardiovascular: Normal rate, regular rhythm, S1 normal and S2 normal. Exam reveals no gallop, no S3 and no S4.  No murmur heard. Pulmonary/Chest: Effort normal and breath sounds normal. No stridor. She has no wheezes. She has no rales.  Musculoskeletal:        General: No edema.     Cervical back: Neck supple.  Skin: Skin is cool and dry.     Impression & Recommendation(s):  Impression:   ICD-10-CM   1. Shortness of breath  R06.02 EKG 12-Lead    Pro b natriuretic peptide (BNP)    Pro b natriuretic peptide (BNP)    2. Leg swelling  M79.89 Pro b natriuretic peptide (BNP)    Pro b natriuretic peptide (BNP)    3. Type 2 diabetes mellitus with hyperglycemia, with long-term current use of insulin  (HCC)  E11.65    Z79.4     4. Benign hypertension  I10 losartan  (COZAAR ) 25 MG tablet    Basic metabolic panel with GFR    Pro b natriuretic peptide (BNP)    Pro b natriuretic peptide (BNP)    Basic metabolic panel with GFR    Basic metabolic panel with GFR    Basic metabolic panel with GFR    5. Proteinuria, unspecified type  R80.9     6. Other iron  deficiency anemia  D50.8     7. History of cigar smoking  Z87.891        Recommendation(s):  Shortness of breath Leg swelling Dyspnea present at rest and with effort related activities. On physical examination not in overt heart failure. But I suspect she has a degree of HFpEF given her symptoms, lower extremity swelling, estimated RAP of 15 mmHg. Will check labs today to evaluate BNP and renal function Start losartan  25 mg p.o. every afternoon and will need repeat labs in 1 week. Review of prior labs illustrate that she has 2+ proteinuria.  May not be an ideal candidate for SGLT2i due to hx of UTI - last one being E.coli (infection  06/2023)  Type 2 diabetes mellitus with hyperglycemia, with long-term current use of insulin  (HCC) Last A1c 5% from Feb 2025, [facility] database Will start ARB for renal protection, BP, and proteinuria.  Insulin  as per primary / diabetic provider.   Benign hypertension Proteinuria, unspecified type Denies having hypertension.  However, given her EMR data and non-ambulatory BP reading she does have HTN and patient agreeable to start ARB for BP and renal protection.  I suspect she will need more than on antihypertensive agent to reach a goal ~130 mmHg.  Other iron  deficiency anemia Follows w/ hematology - recommended that she have labs done to recheck Hb as anemia can also cause dyspnea.   Orders Placed:  Orders Placed This Encounter  Procedures   Basic metabolic panel  with GFR    Standing Status:   Future    Number of Occurrences:   1    Expected Date:   07/16/2023    Expiration Date:   07/15/2024   Pro b natriuretic peptide (BNP)    Standing Status:   Future    Number of Occurrences:   1    Expected Date:   07/16/2023    Expiration Date:   07/15/2024   Basic metabolic panel with GFR    Standing Status:   Future    Number of Occurrences:   1    Expected Date:   07/23/2023    Expiration Date:   07/15/2024   EKG 12-Lead     Final Medication List:    Meds ordered this encounter  Medications   losartan  (COZAAR ) 25 MG tablet    Sig: Take 1 tablet (25 mg total) by mouth daily.    Dispense:  30 tablet    Refill:  3    Medications Discontinued During This Encounter  Medication Reason   celecoxib  (CELEBREX ) 100 MG capsule Completed Course   cephALEXin  (KEFLEX ) 500 MG capsule Completed Course   gabapentin  (NEURONTIN ) 400 MG capsule Change in therapy   medroxyPROGESTERone  (PROVERA ) 5 MG tablet Completed Course   prednisoLONE acetate (PRED FORTE) 1 % ophthalmic suspension Completed Course     Current Outpatient Medications:    albuterol  (VENTOLIN  HFA) 108 (90 Base) MCG/ACT inhaler,  Inhale 2 puffs into the lungs every 6 (six) hours as needed for wheezing or shortness of breath., Disp: 18 g, Rfl: 2   amitriptyline  (ELAVIL ) 25 MG tablet, Take 1 tablet (25 mg total) by mouth at bedtime., Disp: 30 tablet, Rfl: 6   Blood Glucose Monitoring Suppl (ONETOUCH VERIO FLEX SYSTEM) w/Device KIT, 3 (three) times daily., Disp: , Rfl:    Continuous Glucose Sensor (DEXCOM G7 SENSOR) MISC, 1 Device by Does not apply route as directed. Change sensor every 10 days, Disp: 9 each, Rfl: 3   Cyanocobalamin  (VITAMIN B-12) 2500 MCG SUBL, Place 1 tablet (2,500 mcg total) under the tongue daily., Disp: 30 tablet, Rfl: 2   cyclobenzaprine  (FLEXERIL ) 5 MG tablet, Take 1 tablet (5 mg total) by mouth 3 (three) times daily as needed for muscle spasms. (Patient not taking: Reported on 07/18/2023), Disp: 30 tablet, Rfl: 0   diclofenac  Sodium (VOLTAREN ) 1 % GEL, Apply 2 g topically 3 (three) times daily. (Patient not taking: Reported on 07/18/2023), Disp: , Rfl:    ferrous sulfate  325 (65 FE) MG tablet, Take 1 tablet (325 mg total) by mouth daily with breakfast., Disp: 30 tablet, Rfl: 0   gabapentin  (NEURONTIN ) 800 MG tablet, Take 800 mg by mouth 3 (three) times daily., Disp: , Rfl:    glipiZIDE  (GLUCOTROL ) 10 MG tablet, Take 2 tablets (20 mg total) by mouth daily before breakfast AND 1 tablet (10 mg total) daily before supper., Disp: 270 tablet, Rfl: 3   insulin  glargine (LANTUS  SOLOSTAR) 100 UNIT/ML Solostar Pen, Inject 40 Units into the skin daily before breakfast., Disp: 45 mL, Rfl: 2   Insulin  Pen Needle 32G X 4 MM MISC, 1 Device by Does not apply route daily in the afternoon., Disp: 100 each, Rfl: 3   Lancets (ONETOUCH DELICA PLUS LANCET33G) MISC, Apply topically 3 (three) times daily., Disp: , Rfl:    losartan  (COZAAR ) 25 MG tablet, Take 1 tablet (25 mg total) by mouth daily., Disp: 30 tablet, Rfl: 3   metoprolol  tartrate (LOPRESSOR ) 25 MG tablet, Take 1  tablet (25 mg total) by mouth 2 (two) times daily., Disp:  60 tablet, Rfl: 0   mometasone -formoterol  (DULERA ) 100-5 MCG/ACT AERO, Inhale 2 puffs into the lungs 2 (two) times daily at 10 AM and 5 PM., Disp: 13 g, Rfl: 0   Multiple Vitamin (MULTIVITAMIN WITH MINERALS) TABS tablet, Take 1 tablet by mouth daily., Disp: , Rfl:    ONETOUCH VERIO test strip, 3 (three) times daily., Disp: , Rfl:    oxyCODONE -acetaminophen  (PERCOCET/ROXICET) 5-325 MG tablet, Take 1 tablet by mouth 4 (four) times daily as needed for moderate pain (pain score 4-6) or severe pain (pain score 7-10)., Disp: , Rfl:    pantoprazole  (PROTONIX ) 40 MG tablet, Take 1 tablet (40 mg total) by mouth daily., Disp: 30 tablet, Rfl: 0   tiZANidine  (ZANAFLEX ) 4 MG tablet, Take 4 mg by mouth every 12 (twelve) hours as needed for muscle spasms., Disp: , Rfl:    Ubrogepant  (UBRELVY ) 100 MG TABS, Take 1 tablet (100 mg total) by mouth as needed. Take 1 tablet at onset of headache, may repeat in 2 hours if needed. Max is 200 mg in 24 hours. (Patient not taking: Reported on 07/18/2023), Disp: 12 tablet, Rfl: 11   vitamin C  (ASCORBIC ACID ) 250 MG tablet, Take 2 tablets (500 mg total) by mouth daily. (Patient taking differently: Take 250 mg by mouth daily.), Disp: 180 tablet, Rfl: 0   acetaminophen  (TYLENOL ) 500 MG tablet, Take 500 mg by mouth every 6 (six) hours as needed for mild pain (pain score 1-3) or moderate pain (pain score 4-6)., Disp: , Rfl:    doxycycline  (VIBRAMYCIN ) 100 MG capsule, Take 1 capsule (100 mg total) by mouth 2 (two) times daily., Disp: 20 capsule, Rfl: 0   megestrol (MEGACE) 40 MG tablet, Take 1 tablet (40 mg total) by mouth daily., Disp: 30 tablet, Rfl: 0   predniSONE  (DELTASONE ) 10 MG tablet, Take 2 tablets (20 mg total) by mouth daily., Disp: 10 tablet, Rfl: 0 No current facility-administered medications for this visit.  Facility-Administered Medications Ordered in Other Visits:    megestrol (MEGACE) tablet 40 mg, 40 mg, Oral, BID, Arnold, James G, MD, 40 mg at 07/21/23  1609  Consent:   NA  Disposition:   6 month sooner if needed  Patient may be asked to follow-up sooner based on the results of the above-mentioned testing.  Her questions and concerns were addressed to her satisfaction. She voices understanding of the recommendations provided during this encounter.    Signed, Olinda Bertrand, [place], [facility]  [facility]  07/21/2023 6:50 PM

## 2023-07-19 NOTE — Pre-Procedure Instructions (Signed)
 Surgical Instructions   Your procedure is scheduled on Thursday, Aug 01, 2023. Report to [facility] Main Entrance "A" at 7:55 A.M., then check in with the Admitting office. Any questions or running late day of surgery: call [phone]  Questions prior to your surgery date: call [phone], Monday-Friday, 8am-4pm. If you experience any cold or flu symptoms such as cough, fever, chills, shortness of breath, etc. between now and your scheduled surgery, please notify us  at the above number.     Remember:  Do not eat after midnight the night before your surgery  You may drink clear liquids until 7:55am the morning of your surgery.   Clear liquids allowed are: Water , Non-Citrus Juices (without pulp), Carbonated Beverages, Clear Tea (no milk, honey, etc.), Black Coffee Only (NO MILK, CREAM OR POWDERED CREAMER of any kind), and Gatorade.  Patient Instructions  The night before surgery:  No food after midnight. ONLY clear liquids after midnight  The day of surgery (if you have diabetes): Drink ONE (1) 12 oz G2 given to you in your pre admission testing appointment by 7:55am the morning of surgery. Drink in one sitting. Do not sip.  This drink was given to you during your hospital  pre-op appointment visit.  Nothing else to drink after completing the  12 oz bottle of G2.         If you have questions, please contact your surgeon's office.     Take these medicines the morning of surgery with A SIP OF WATER  : Gabapentin  (Neurontin ) Metoprolol  Tartrate (Lopressor ) Mometasone -Formoterol  (Dulera ) Pantoprazole  (Protonix )  May take these medicines IF NEEDED: Acetaminophen  (Tylenol ) Albuterol  (Ventolin  HFA)- Please bring all inhalers with you the day of surgery.  Oxycodone -acetaminophen  (Percocet) Tizanidine  (Zanaflex )  One week prior to surgery, STOP taking any Aspirin (unless otherwise instructed by your surgeon) Aleve , Naproxen , Ibuprofen , Motrin , Advil , Goody's, BC's, all herbal  medications, fish oil, and non-prescription vitamins.  WHAT DO I DO ABOUT MY DIABETES MEDICATION?  The Day Before Surgery (May 14), you will ONLY TAKE a MORNING dose of Glipizide  (Glucotrol ). Do NOT take an evening dose of Glipizide  (Glucotrol ) on May 14.   Do not take oral diabetes medicines (pills) the morning of surgery. No Glipizide  (Glucotrol ) the morning of surgery.     THE MORNING OF SURGERY, take a half dose of your Insulin  Glargine (Lantus  Solostar) which is 20 units.   HOW TO MANAGE YOUR DIABETES BEFORE AND AFTER SURGERY  Why is it important to control my blood sugar before and after surgery? Improving blood sugar levels before and after surgery helps healing and can limit problems. A way of improving blood sugar control is eating a healthy diet by:  Eating less sugar and carbohydrates  Increasing activity/exercise  Talking with your doctor about reaching your blood sugar goals High blood sugars (greater than 180 mg/dL) can raise your risk of infections and slow your recovery, so you will need to focus on controlling your diabetes during the weeks before surgery. Make sure that the doctor who takes care of your diabetes knows about your planned surgery including the date and location.  How do I manage my blood sugar before surgery? Check your blood sugar at least 4 times a day, starting 2 days before surgery, to make sure that the level is not too high or low.  Check your blood sugar the morning of your surgery when you wake up and every 2 hours until you get to the Short Stay unit.  If your blood  sugar is less than 70 mg/dL, you will need to treat for low blood sugar: Do not take insulin . Treat a low blood sugar (less than 70 mg/dL) with  cup of clear juice (cranberry or apple), 4 glucose tablets, OR glucose gel. Recheck blood sugar in 15 minutes after treatment (to make sure it is greater than 70 mg/dL). If your blood sugar is not greater than 70 mg/dL on recheck, call  [phone] for further instructions. Report your blood sugar to the short stay nurse when you get to Short Stay.  If you are admitted to the hospital after surgery: Your blood sugar will be checked by the staff and you will probably be given insulin  after surgery (instead of oral diabetes medicines) to make sure you have good blood sugar levels. The goal for blood sugar control after surgery is 80-180 mg/dL.                      Do NOT Smoke (Tobacco/Vaping) for 24 hours prior to your procedure.  If you use a CPAP at night, you may bring your mask/headgear for your overnight stay.   You will be asked to remove any contacts, glasses, piercing's, hearing aid's, dentures/partials prior to surgery. Please bring cases for these items if needed.    Patients discharged the day of surgery will not be allowed to drive home, and someone needs to stay with them for 24 hours.  SURGICAL WAITING ROOM VISITATION Patients may have no more than 2 support people in the waiting area - these visitors may rotate.   Pre-op nurse will coordinate an appropriate time for 1 ADULT support person, who may not rotate, to accompany patient in pre-op.  Children under the age of 12 must have an adult with them who is not the patient and must remain in the main waiting area with an adult.  If the patient needs to stay at the hospital during part of their recovery, the visitor guidelines for inpatient rooms apply.  Please refer to the [facility] website for the visitor guidelines for any additional information.   If you received a COVID test during your pre-op visit  it is requested that you wear a mask when out in public, stay away from anyone that may not be feeling well and notify your surgeon if you develop symptoms. If you have been in contact with anyone that has tested positive in the last 10 days please notify you surgeon.      Pre-operative 5 CHG Bathing Instructions   You can play a key role in reducing  the risk of infection after surgery. Your skin needs to be as free of germs as possible. You can reduce the number of germs on your skin by washing with CHG (chlorhexidine  gluconate) soap before surgery. CHG is an antiseptic soap that kills germs and continues to kill germs even after washing.   DO NOT use if you have an allergy to chlorhexidine /CHG or antibacterial soaps. If your skin becomes reddened or irritated, stop using the CHG and notify one of our RNs at [phone].   Please shower with the CHG soap starting 4 days before surgery using the following schedule:     Please keep in mind the following:  DO NOT shave, including legs and underarms, starting the day of your first shower.   You may shave your face at any point before/day of surgery.  Place clean sheets on your bed the day you start using CHG soap.  Use a clean washcloth (not used since being washed) for each shower. DO NOT sleep with pets once you start using the CHG.   CHG Shower Instructions:  Wash your face and private area with normal soap. If you choose to wash your hair, wash first with your normal shampoo.  After you use shampoo/soap, rinse your hair and body thoroughly to remove shampoo/soap residue.  Turn the water  OFF and apply about 3 tablespoons (45 ml) of CHG soap to a CLEAN washcloth.  Apply CHG soap ONLY FROM YOUR NECK DOWN TO YOUR TOES (washing for 3-5 minutes)  DO NOT use CHG soap on face, private areas, open wounds, or sores.  Pay special attention to the area where your surgery is being performed.  If you are having back surgery, having someone wash your back for you may be helpful. Wait 2 minutes after CHG soap is applied, then you may rinse off the CHG soap.  Pat dry with a clean towel  Put on clean clothes/pajamas   If you choose to wear lotion, please use ONLY the CHG-compatible lotions that are listed below.  Additional instructions for the day of surgery: DO NOT APPLY any lotions, deodorants,  cologne, or perfumes.   Do not bring valuables to the hospital. [facility] is not responsible for any belongings/valuables. Do not wear nail polish, gel polish, artificial nails, or any other type of covering on natural nails (fingers and toes) Do not wear jewelry or makeup Put on clean/comfortable clothes.  Please brush your teeth.  Ask your nurse before applying any prescription medications to the skin.     CHG Compatible Lotions   Aveeno Moisturizing lotion  Cetaphil Moisturizing Cream  Cetaphil Moisturizing Lotion  Clairol Herbal Essence Moisturizing Lotion, Dry Skin  Clairol Herbal Essence Moisturizing Lotion, Extra Dry Skin  Clairol Herbal Essence Moisturizing Lotion, Normal Skin  Curel Age Defying Therapeutic Moisturizing Lotion with Alpha Hydroxy  Curel Extreme Care Body Lotion  Curel Soothing Hands Moisturizing Hand Lotion  Curel Therapeutic Moisturizing Cream, Fragrance-Free  Curel Therapeutic Moisturizing Lotion, Fragrance-Free  Curel Therapeutic Moisturizing Lotion, Original Formula  Eucerin Daily Replenishing Lotion  Eucerin Dry Skin Therapy Plus Alpha Hydroxy Crme  Eucerin Dry Skin Therapy Plus Alpha Hydroxy Lotion  Eucerin Original Crme  Eucerin Original Lotion  Eucerin Plus Crme Eucerin Plus Lotion  Eucerin TriLipid Replenishing Lotion  Keri Anti-Bacterial Hand Lotion  Keri Deep Conditioning Original Lotion Dry Skin Formula Softly Scented  Keri Deep Conditioning Original Lotion, Fragrance Free Sensitive Skin Formula  Keri Lotion Fast Absorbing Fragrance Free Sensitive Skin Formula  Keri Lotion Fast Absorbing Softly Scented Dry Skin Formula  Keri Original Lotion  Keri Skin Renewal Lotion Keri Silky Smooth Lotion  Keri Silky Smooth Sensitive Skin Lotion  Nivea Body Creamy Conditioning Oil  Nivea Body Extra Enriched Lotion  Nivea Body Original Lotion  Nivea Body Sheer Moisturizing Lotion Nivea Crme  Nivea Skin Firming Lotion  NutraDerm 30 Skin Lotion   NutraDerm Skin Lotion  NutraDerm Therapeutic Skin Cream  NutraDerm Therapeutic Skin Lotion  ProShield Protective Hand Cream  Provon moisturizing lotion  Please read over the following fact sheets that you were given.

## 2023-07-21 ENCOUNTER — Emergency Department (HOSPITAL_COMMUNITY)
Admission: EM | Admit: 2023-07-21 | Discharge: 2023-07-21 | Disposition: A | Discharge status: Home / Self Care | Attending: Emergency Medicine | Admitting: Emergency Medicine

## 2023-07-21 ENCOUNTER — Other Ambulatory Visit: Payer: Self-pay

## 2023-07-21 ENCOUNTER — Encounter: Payer: Self-pay | Admitting: Cardiology

## 2023-07-21 DIAGNOSIS — D649 Anemia, unspecified: Secondary | ICD-10-CM | POA: Diagnosis not present

## 2023-07-21 DIAGNOSIS — Z794 Long term (current) use of insulin: Secondary | ICD-10-CM | POA: Insufficient documentation

## 2023-07-21 DIAGNOSIS — N920 Excessive and frequent menstruation with regular cycle: Secondary | ICD-10-CM

## 2023-07-21 DIAGNOSIS — N939 Abnormal uterine and vaginal bleeding, unspecified: Secondary | ICD-10-CM | POA: Insufficient documentation

## 2023-07-21 DIAGNOSIS — R000 Tachycardia, unspecified: Secondary | ICD-10-CM | POA: Diagnosis not present

## 2023-07-21 LAB — CBC
HCT: 18.2 % — ABNORMAL LOW (ref 36.0–46.0)
Hemoglobin: 6 g/dL — CL (ref 12.0–15.0)
MCH: 27.9 pg (ref 26.0–34.0)
MCHC: 33 g/dL (ref 30.0–36.0)
MCV: 84.7 fL (ref 80.0–100.0)
Platelets: 164 K/uL (ref 150–400)
RBC: 2.15 MIL/uL — ABNORMAL LOW (ref 3.87–5.11)
RDW: 17.2 % — ABNORMAL HIGH (ref 11.5–15.5)
WBC: 6.4 K/uL (ref 4.0–10.5)
nRBC: 0 % (ref 0.0–0.2)

## 2023-07-21 LAB — BASIC METABOLIC PANEL WITH GFR
Anion gap: 8 (ref 5–15)
BUN: 25 mg/dL — ABNORMAL HIGH (ref 6–20)
CO2: 16 mmol/L — ABNORMAL LOW (ref 22–32)
Calcium: 8.1 mg/dL — ABNORMAL LOW (ref 8.9–10.3)
Chloride: 112 mmol/L — ABNORMAL HIGH (ref 98–111)
Creatinine, Ser: 1.07 mg/dL — ABNORMAL HIGH (ref 0.44–1.00)
GFR, Estimated: >60 mL/min
Glucose, Bld: 259 mg/dL — ABNORMAL HIGH (ref 70–99)
Potassium: 4.6 mmol/L (ref 3.5–5.1)
Sodium: 136 mmol/L (ref 135–145)

## 2023-07-21 LAB — HCG, SERUM, QUALITATIVE: Preg, Serum: NEGATIVE

## 2023-07-21 LAB — PREPARE RBC (CROSSMATCH)

## 2023-07-21 MED ORDER — MEGESTROL ACETATE 40 MG PO TABS: 40.0000 mg | ORAL_TABLET | Freq: Every day | ORAL | 0 refills | Status: DC

## 2023-07-21 MED ORDER — PREDNISONE 10 MG PO TABS: 20.0000 mg | ORAL_TABLET | Freq: Every day | ORAL | 0 refills | Status: DC

## 2023-07-21 MED ORDER — MEGESTROL ACETATE 40 MG PO TABS
40.0000 mg | ORAL_TABLET | Freq: Two times a day (BID) | ORAL | Status: DC
  Administered 2023-07-21: 40 mg via ORAL
  Filled 2023-07-21 (×2): qty 1

## 2023-07-21 MED ORDER — ESTROGENS CONJUGATED 25 MG IJ SOLR
25.0000 mg | Freq: Once | INTRAMUSCULAR | Status: AC
Start: 2023-07-21 — End: 2023-07-21
  Administered 2023-07-21: 25 mg via INTRAVENOUS
  Filled 2023-07-21: qty 25

## 2023-07-21 MED ORDER — DOXYCYCLINE HYCLATE 100 MG PO CAPS: 100.0000 mg | ORAL_CAPSULE | Freq: Two times a day (BID) | ORAL | 0 refills | Status: DC

## 2023-07-21 MED ORDER — SODIUM CHLORIDE 0.9% IV SOLUTION: Freq: Once | INTRAVENOUS | Status: AC

## 2023-07-21 MED ORDER — LACTATED RINGERS IV BOLUS
500.0000 mL | Freq: Once | INTRAVENOUS | Status: AC
  Administered 2023-07-21: 500 mL via INTRAVENOUS

## 2023-07-21 NOTE — ED Notes (Signed)
 Post infusion documentation completed.   Pt denied s/s of transfusion reaction. IV flushed and saline locked.  V/S as documented.   Pt Aox4, GCS 15, sitting upright, eating, with family at bedside.

## 2023-07-21 NOTE — ED Notes (Signed)
 Blood consent signed prior to transfusion start.   Pt educated on signs of transfusion reaction.   Pt denied hx of transfusion reaction and reports several in the past with no complications.  Pre-blood vital signs documented.   Pt Aox4, GCS 15.

## 2023-07-21 NOTE — Discharge Instructions (Signed)
 Please follow-up with Dr. Willey Harrier as per his instructions A prescription for Megace has been sent to your pharmacy.  Please start taking this as soon as possible. Return if you are having any worsening symptoms anytime

## 2023-07-21 NOTE — Consult Note (Signed)
 Reason for Consult:DUB and anemia Referring Physician: Dr. [name] is an 43 y.o. female. G6P0 Patient's last menstrual period was 07/19/2023 (exact date). Patient has a history of uterine fibroid and heavy menses, previous anemia and transfusion and she presents with bleeding and weakness and lightheaded. She has missed an appointment with me for f/u, considering LNG IUD for menorrhagia. Her last US  showed a mural fibroid with no endometrial lesion  Pertinent Gynecological History: Menses:  heavy using diapers for flow Bleeding: dysfunctional uterine bleeding Contraception: BTL Blood transfusions:  yes Sexually transmitted diseases: no past history Last mammogram: normal Date: 07/2022 Last pap: normal Date: 05/2022 OB History: G6, P6   Menstrual History:  Patient's last menstrual period was 07/19/2023 (exact date).    Past Medical History:  Diagnosis Date   Anemia    Arthritis 08/17/2020   Chronic back pain    Diabetes mellitus without complication (HCC)    type 2   GERD (gastroesophageal reflux disease)    Migraine    Ovarian cyst    Pneumonia    Polyneuropathy     Past Surgical History:  Procedure Laterality Date   CESAREAN SECTION     CHOLECYSTECTOMY  2004   LUMBAR LAMINECTOMY N/A 05/30/2020   Procedure: Lumbar five Laminectomy,  Bilateral Microdiscectomy, Left Lumbar five -Sacral one Microdiscectomy;  Surgeon: Adah Acron, MD;  Location: [facility] OR;  Service: Orthopedics;  Laterality: N/A;   LUMBAR LAMINECTOMY Left 11/14/2020   Procedure: LEFT LUMBAR FOUR-FIVE MICRODISCECTOMY;  Surgeon: Adah Acron, MD;  Location: [facility] OR;  Service: Orthopedics;  Laterality: Left;   TUBAL LIGATION  10/19/2010    Family History  Problem Relation Age of Onset   Diabetes Mother    Hypertension Mother    Stroke Mother     Social History:  reports that she has been smoking cigars. She has never used smokeless tobacco. She reports that she does not currently use  alcohol . She reports that she does not currently use drugs.  Allergies:  Allergies  Allergen Reactions   Trulicity  [Dulaglutide ] Diarrhea    Medications: I have reviewed the patient's current medications.  Review of Systems  Gastrointestinal: Negative.   Genitourinary:  Positive for menstrual problem and vaginal bleeding. Negative for pelvic pain.  Neurological:  Positive for light-headedness.  Sx improved after transfusion  Blood pressure (!) 149/78, pulse 83, temperature 98.5 F (36.9 C), temperature source Oral, resp. rate 16, height 5\' 9"  (1.753 m), weight 97.5 kg, last menstrual period 07/19/2023, SpO2 100%. Physical Exam Vitals and nursing note reviewed.  Constitutional:      Appearance: Normal appearance. She is obese.  HENT:     Head: Normocephalic and atraumatic.  Cardiovascular:     Rate and Rhythm: Normal rate.  Pulmonary:     Effort: Pulmonary effort is normal.  Skin:    General: Skin is warm and dry.  Neurological:     Mental Status: She is alert.  Psychiatric:        Mood and Affect: Mood normal.        Behavior: Behavior normal.     Results for orders placed or performed during the hospital encounter of 07/21/23 (from the past 48 hours)  CBC     Status: Abnormal   Collection Time: 07/21/23 10:20 AM  Result Value Ref Range   WBC 6.4 4.0 - 10.5 K/uL   RBC 2.15 (L) 3.87 - 5.11 MIL/uL   Hemoglobin 6.0 (LL) 12.0 - 15.0 g/dL  Comment: REPEATED TO VERIFY THIS CRITICAL RESULT HAS VERIFIED AND BEEN CALLED TO D GRAY RN BY DAVID BLAIR ON 05 04 2025 AT 1158, AND HAS BEEN READ BACK.     HCT 18.2 (L) 36.0 - 46.0 %   MCV 84.7 80.0 - 100.0 fL   MCH 27.9 26.0 - 34.0 pg   MCHC 33.0 30.0 - 36.0 g/dL   RDW 16.1 (H) 09.6 - 04.5 %   Platelets 164 150 - 400 K/uL   nRBC 0.0 0.0 - 0.2 %    Comment: Performed at [facility], [address], [place], [place] [address]  Basic metabolic panel     Status: Abnormal   Collection Time: 07/21/23 10:20 AM  Result Value  Ref Range   Sodium 136 135 - 145 mmol/L   Potassium 4.6 3.5 - 5.1 mmol/L   Chloride 112 (H) 98 - 111 mmol/L   CO2 16 (L) 22 - 32 mmol/L   Glucose, Bld 259 (H) 70 - 99 mg/dL    Comment: Glucose reference range applies only to samples taken after fasting for at least 8 hours.   BUN 25 (H) 6 - 20 mg/dL   Creatinine, Ser 1.91 (H) 0.44 - 1.00 mg/dL   Calcium  8.1 (L) 8.9 - 10.3 mg/dL   GFR, Estimated >47 >82 mL/min    Comment: (NOTE) Calculated using the CKD-EPI Creatinine Equation (2021)    Anion gap 8 5 - 15    Comment: Performed at [facility], [address] [place], [place] [address]  hCG, serum, qualitative     Status: None   Collection Time: 07/21/23 10:20 AM  Result Value Ref Range   Preg, Serum NEGATIVE NEGATIVE    Comment:        THE SENSITIVITY OF THIS METHODOLOGY IS >10 mIU/mL. Performed at [facility], [address] [place], [place] [address]   Type and screen [facility]     Status: None (Preliminary result)   Collection Time: 07/21/23 10:34 AM  Result Value Ref Range   ABO/RH(D) A POS    Antibody Screen NEG    Sample Expiration 07/24/2023,2359    Unit Number [id number]    Blood Component Type RBC LR PHER2    Unit division 00    Status of Unit ISSUED    Transfusion Status OK TO TRANSFUSE    Crossmatch Result      Compatible Performed at [facility], [address] [place], [place] [address]   Prepare RBC (crossmatch)     Status: None   Collection Time: 07/21/23 12:30 PM  Result Value Ref Range   Order Confirmation      ORDER PROCESSED BY BLOOD BANK Performed at [facility], [address], [place], [place] [address]     Narrative & Impression  CLINICAL DATA:  [phone] Abdominal pain [phone] Endometrial mass [phone]. Last menstrual period 04/25/2023   EXAM: TRANSABDOMINAL AND TRANSVAGINAL ULTRASOUND OF PELVIS   TECHNIQUE: Both transabdominal and transvaginal ultrasound examinations of the pelvis  were performed. Transabdominal technique was performed for global imaging of the pelvis including uterus, ovaries, adnexal regions, and pelvic cul-de-sac. It was necessary to proceed with endovaginal exam following the transabdominal exam to visualize the endometrium and bilateral ovaries.   COMPARISON:  CT abdomen pelvis 02/09/2023   FINDINGS: Uterus   Measurements: 9.3 x 5.9 x 7.5 cm = volume: 215.2 mL. 3.8 x 3.1 x 3 cm heterogeneous posterior right  intramural mass likely uterine fibroid.   Endometrium   Thickness: 9 mm.  No focal abnormality visualized.   Right ovary   Measurements: 3.2 x 2 x 2.1 cm = volume: 7.1 mL. The right ovary is unremarkable. There is a tubular like anechoic structure within the right adnexa.   Left ovary   Measurements: 4.1 x 1.8 x 3.7 cm = volume: 14.1 mL. Normal appearance/no adnexal mass.   Other findings   Small volume simple free fluid within the pelvis.   IMPRESSION: 1. Suggestion of right hydrosalpinx. 2. Trace simple free fluid within the pelvis. 3. Intramural uterine fibroid.     Electronically Signed   By: Morgane  Naveau M.D.   On: 05/11/2023 13:51        Assessment/Plan: DUb/menorrhagia with anemia. S/p transfusion and may be discharged for F/U and [facility]. I sent a message to schedule. Megace 40 mg PO BID, continue iron  supplement Ordered Premarin 25 mg IV single dose   Onnie Bilis 07/21/2023

## 2023-07-21 NOTE — ED Notes (Addendum)
 Pt is difficult IV start. Current access will pull for blood but not flush. Attempting USGPIV.

## 2023-07-21 NOTE — ED Provider Notes (Addendum)
 [place] EMERGENCY DEPARTMENT AT [facility] Provider Note   CSN: [id number] Arrival date & time: 07/21/23  2130     History  Chief Complaint  Patient presents with   Vaginal Bleeding   low hemaglobin    Melinda Stone is a 43 y.o. female.  HPI 43 year old woman history of vaginal bleeding with anemia presents today with generalized weakness.  She reports that she began having her menstrual cycle again on Friday which has had a normal time for her.  She is going through about 4 depends per day.  She has felt somewhat lightheaded and weak which she identifies as having symptoms consistent with her hemoglobin being low in the past.  She reports that she has had multiple transfusions.  She reports that she is post to have an ablation.  She denies being on any estrogen or other hormones at this time.   She is followed [facility] Tom[facility]. Home Medications Prior to Admission medications   Medication Sig Start Date End Date Taking? Authorizing Provider  acetaminophen  (TYLENOL ) 500 MG tablet Take 500 mg by mouth every 6 (six) hours as needed for mild pain (pain score 1-3) or moderate pain (pain score 4-6).    [provider]  albuterol  (VENTOLIN  HFA) 108 (90 Base) MCG/ACT inhaler Inhale 2 puffs into the lungs every 6 (six) hours as needed for wheezing or shortness of breath. 05/29/23   Setzer, Hamp Levine, PA-C  amitriptyline  (ELAVIL ) 25 MG tablet Take 1 tablet (25 mg total) by mouth at bedtime. 01/31/23   Wess Hammed, NP  Blood Glucose Monitoring Suppl (ONETOUCH VERIO FLEX SYSTEM) w/Device KIT 3 (three) times daily. 06/14/23   [provider]  Continuous Glucose Sensor (DEXCOM G7 SENSOR) MISC 1 Device by Does not apply route as directed. Change sensor every 10 days 06/21/23   Shamleffer, Ibtehal Jaralla, MD  Cyanocobalamin  (VITAMIN B-12) 2500 MCG SUBL Place 1 tablet (2,500 mcg total) under the tongue daily. 04/23/23   Timmy Forbes, MD   cyclobenzaprine  (FLEXERIL ) 5 MG tablet Take 1 tablet (5 mg total) by mouth 3 (three) times daily as needed for muscle spasms. Patient not taking: Reported on 07/18/2023 05/29/23   Setzer, Sandra J, PA-C  diclofenac  Sodium (VOLTAREN ) 1 % GEL Apply 2 g topically 3 (three) times daily. Patient not taking: Reported on 07/18/2023 05/29/23   Setzer, Sandra J, PA-C  ferrous sulfate  325 (65 FE) MG tablet Take 1 tablet (325 mg total) by mouth daily with breakfast. 05/29/23   Setzer, Sandra J, PA-C  gabapentin  (NEURONTIN ) 800 MG tablet Take 800 mg by mouth 3 (three) times daily.    [provider]  glipiZIDE  (GLUCOTROL ) 10 MG tablet Take 2 tablets (20 mg total) by mouth daily before breakfast AND 1 tablet (10 mg total) daily before supper. 06/21/23   Shamleffer, Julian Obey, MD  insulin  glargine (LANTUS  SOLOSTAR) 100 UNIT/ML Solostar Pen Inject 40 Units into the skin daily before breakfast. 06/21/23   Shamleffer, Ibtehal Jaralla, MD  Insulin  Pen Needle 32G X 4 MM MISC 1 Device by Does not apply route daily in the afternoon. 06/21/23   Shamleffer, Ibtehal Jaralla, MD  Lancets [facility] DELICA PLUS [place]) MISC Apply topically 3 (three) times daily. 06/14/23   [provider]  losartan  (COZAAR ) 25 MG tablet Take 1 tablet (25 mg total) by mouth daily. 07/16/23   Tolia, Sunit, DO  metoprolol  tartrate (LOPRESSOR ) 25 MG tablet Take 1 tablet (25 mg total) by mouth  2 (two) times daily. 05/29/23 07/18/23  Setzer, Sandra J, PA-C  mometasone -formoterol  (DULERA ) 100-5 MCG/ACT AERO Inhale 2 puffs into the lungs 2 (two) times daily at 10 AM and 5 PM. 05/29/23   Setzer, Sandra J, PA-C  Multiple Vitamin (MULTIVITAMIN WITH MINERALS) TABS tablet Take 1 tablet by mouth daily. 05/29/23   Setzer, Sandra J, PA-C  ONETOUCH VERIO test strip 3 (three) times daily. 06/14/23   [provider]  oxyCODONE -acetaminophen  (PERCOCET/ROXICET) 5-325 MG tablet Take 1 tablet by mouth 4 (four) times daily as needed for moderate pain  (pain score 4-6) or severe pain (pain score 7-10).    [provider]  pantoprazole  (PROTONIX ) 40 MG tablet Take 1 tablet (40 mg total) by mouth daily. 05/29/23   Setzer, Sandra J, PA-C  tiZANidine  (ZANAFLEX ) 4 MG tablet Take 4 mg by mouth every 12 (twelve) hours as needed for muscle spasms. 06/10/23   [provider]  Ubrogepant  (UBRELVY ) 100 MG TABS Take 1 tablet (100 mg total) by mouth as needed. Take 1 tablet at onset of headache, may repeat in 2 hours if needed. Max is 200 mg in 24 hours. Patient not taking: Reported on 07/18/2023 01/31/23   Wess Hammed, NP  vitamin C  (ASCORBIC ACID ) 250 MG tablet Take 2 tablets (500 mg total) by mouth daily. Patient taking differently: Take 250 mg by mouth daily. 04/10/22   Timmy Forbes, MD      Allergies    Trulicity  [place]ton ]    Review of Systems   Review of Systems  Physical Exam Updated Vital Signs BP 115/75 (BP Location: Left Arm)   Temp 97.7 F (36.5 C)   Resp 18   Ht 1.753 m (5\' 9" )   Wt 97.5 kg   LMP 07/19/2023 (Exact Date)   SpO2 100%   BMI 31.75 kg/m  Physical Exam Vitals reviewed.  HENT:     Head: Normocephalic.     Right Ear: External ear normal.     Left Ear: External ear normal.     Nose: Nose normal.     Mouth/Throat:     Comments: Mucous membranes and tongue are pale Eyes:     Comments: Conjunctival are pale  Cardiovascular:     Rate and Rhythm: Regular rhythm. Tachycardia present.  Pulmonary:     Effort: Pulmonary effort is normal.     Breath sounds: Normal breath sounds.  Abdominal:     General: Abdomen is flat.     Palpations: Abdomen is soft.  Musculoskeletal:     Cervical back: Normal range of motion.  Skin:    General: Skin is warm.     Capillary Refill: Capillary refill takes less than 2 seconds.  Neurological:     General: No focal deficit present.     Mental Status: She is alert.  Psychiatric:        Mood and Affect: Mood normal.     ED Results / Procedures / Treatments    Labs (all labs ordered are listed, but only abnormal results are displayed) Labs Reviewed  CBC - Abnormal; Notable for the following components:      Result Value   RBC 2.15 (*)    Hemoglobin 6.0 (*)    HCT 18.2 (*)    RDW 17.2 (*)    All other components within normal limits  BASIC METABOLIC PANEL WITH GFR - Abnormal; Notable for the following components:   Chloride 112 (*)    CO2 16 (*)    Glucose, Bld  259 (*)    BUN 25 (*)    Creatinine, Ser 1.07 (*)    Calcium  8.1 (*)    All other components within normal limits  HCG, SERUM, QUALITATIVE  I-STAT CHEM 8, ED  TYPE AND SCREEN  PREPARE RBC (CROSSMATCH)    EKG None  Radiology No results found.  Procedures Procedures    Medications Ordered in ED Medications  0.9 %  sodium chloride  infusion (Manually program via Guardrails IV Fluids) (has no administration in time range)  lactated ringers  bolus 500 mL (500 mLs Intravenous New Bag/Given 07/21/23 1156)    ED Course/ Medical Decision Making/ A&P                                 Medical Decision Making Amount and/or Complexity of Data Reviewed Labs: ordered.  Risk Prescription drug management.   43 year old female abnormal vaginal bleeding presents today with increased weakness Differential diagnosis includes but not limited to anemia, infection, volume depletion, l electrolyte abnormalit, hypoglycemia.  (Test is negative here Patient examined and has blood in vaginal vault with fairly brisk vaginal bleeding.  Hemoglobin is 6.  Orders have been placed to transfuse patient. Care discussed with Dr. Willey Harrier who is her primary provider.  He will see for care and transfusion. Patient seen by Dr. Willey Harrier and cleared for discharge home.  Patient given prescription of megace       Final Clinical Impression(s) / ED Diagnoses Final diagnoses:  Vaginal bleeding  Symptomatic anemia    Rx / DC Orders ED Discharge Orders     None         Auston Blush,  MD 07/21/23 1221    Auston Blush, MD 07/21/23 1521

## 2023-07-21 NOTE — ED Triage Notes (Signed)
 States that "every time I have my period, my hemoglobin drops"  has needed transfusions in the past-- LMP Friday-- uses depends -- not reg pads- going through approx 3 a day.  Pale-- Very pale mucus membranes-- palms of hands pale

## 2023-07-22 ENCOUNTER — Encounter (HOSPITAL_COMMUNITY): Payer: Self-pay

## 2023-07-22 ENCOUNTER — Encounter (HOSPITAL_COMMUNITY)
Admission: RE | Admit: 2023-07-22 | Discharge: 2023-07-22 | Disposition: A | Discharge status: Home / Self Care | Source: Ambulatory Visit | Attending: Orthopedic Surgery | Admitting: Orthopedic Surgery

## 2023-07-22 ENCOUNTER — Encounter (HOSPITAL_COMMUNITY): Payer: Self-pay | Admitting: Vascular Surgery

## 2023-07-22 ENCOUNTER — Other Ambulatory Visit: Payer: Self-pay

## 2023-07-22 VITALS — BP 126/73 | HR 90 | Temp 98.1°F | Resp 18 | Ht 69.0 in | Wt 220.0 lb

## 2023-07-22 DIAGNOSIS — D649 Anemia, unspecified: Secondary | ICD-10-CM | POA: Diagnosis not present

## 2023-07-22 DIAGNOSIS — D259 Leiomyoma of uterus, unspecified: Secondary | ICD-10-CM | POA: Diagnosis not present

## 2023-07-22 DIAGNOSIS — Z794 Long term (current) use of insulin: Secondary | ICD-10-CM | POA: Insufficient documentation

## 2023-07-22 DIAGNOSIS — Z01812 Encounter for preprocedural laboratory examination: Secondary | ICD-10-CM | POA: Diagnosis present

## 2023-07-22 DIAGNOSIS — J45909 Unspecified asthma, uncomplicated: Secondary | ICD-10-CM | POA: Diagnosis not present

## 2023-07-22 DIAGNOSIS — Z87891 Personal history of nicotine dependence: Secondary | ICD-10-CM | POA: Insufficient documentation

## 2023-07-22 DIAGNOSIS — Z01818 Encounter for other preprocedural examination: Secondary | ICD-10-CM

## 2023-07-22 DIAGNOSIS — N920 Excessive and frequent menstruation with regular cycle: Secondary | ICD-10-CM | POA: Diagnosis not present

## 2023-07-22 DIAGNOSIS — M5416 Radiculopathy, lumbar region: Secondary | ICD-10-CM | POA: Insufficient documentation

## 2023-07-22 DIAGNOSIS — E1142 Type 2 diabetes mellitus with diabetic polyneuropathy: Secondary | ICD-10-CM | POA: Diagnosis not present

## 2023-07-22 LAB — SURGICAL PCR SCREEN
MRSA, PCR: NEGATIVE
Staphylococcus aureus: NEGATIVE

## 2023-07-22 LAB — CBC
HCT: 20.3 % — ABNORMAL LOW (ref 36.0–46.0)
Hemoglobin: 6.9 g/dL — CL (ref 12.0–15.0)
MCH: 27.7 pg (ref 26.0–34.0)
MCHC: 34 g/dL (ref 30.0–36.0)
MCV: 81.5 fL (ref 80.0–100.0)
Platelets: 185 10*3/uL (ref 150–400)
RBC: 2.49 MIL/uL — ABNORMAL LOW (ref 3.87–5.11)
RDW: 16.9 % — ABNORMAL HIGH (ref 11.5–15.5)
WBC: 9.6 10*3/uL (ref 4.0–10.5)
nRBC: 0 % (ref 0.0–0.2)

## 2023-07-22 LAB — TYPE AND SCREEN
ABO/RH(D): A POS
Antibody Screen: NEGATIVE
Unit division: 0

## 2023-07-22 LAB — HEMOGLOBIN A1C
Hgb A1c MFr Bld: 4.6 % — ABNORMAL LOW (ref 4.8–5.6)
Mean Plasma Glucose: 85.32 mg/dL

## 2023-07-22 LAB — BPAM RBC
Blood Product Expiration Date: 202505252359
ISSUE DATE / TIME: 202505041321
Unit Type and Rh: 6200

## 2023-07-22 LAB — BASIC METABOLIC PANEL WITH GFR
Anion gap: 7 (ref 5–15)
BUN: 24 mg/dL — ABNORMAL HIGH (ref 6–20)
CO2: 18 mmol/L — ABNORMAL LOW (ref 22–32)
Calcium: 8.1 mg/dL — ABNORMAL LOW (ref 8.9–10.3)
Chloride: 112 mmol/L — ABNORMAL HIGH (ref 98–111)
Creatinine, Ser: 1.04 mg/dL — ABNORMAL HIGH (ref 0.44–1.00)
GFR, Estimated: >60 mL/min (ref >60)
Glucose, Bld: 254 mg/dL — ABNORMAL HIGH (ref 70–99)
Potassium: 4.8 mmol/L (ref 3.5–5.1)
Sodium: 137 mmol/L (ref 135–145)

## 2023-07-22 LAB — GLUCOSE, CAPILLARY: Glucose-Capillary: 225 mg/dL — ABNORMAL HIGH (ref 70–99)

## 2023-07-22 NOTE — Progress Notes (Signed)
 PCP - Gladys Lamp Cardiologist - Sunit Tolia,DO  PPM/ICD - denies Device Orders -  Rep Notified -   Chest x-ray - 05/14/23 EKG - 07/16/23 Stress Test - denies ECHO - 05/13/23 Cardiac Cath - denies  Sleep Study - denies CPAP - no  Fasting Blood Sugar - 65-178 Checks Blood Sugar -has a Dexcom  Last dose of GLP1 agonist-  na GLP1 instructions:   Blood Thinner Instructions:na  Aspirin Instructions:na  ERAS Protcol -clears until 0755 PRE-SURGERY Ensure or G2- G2  COVID TEST- na   Anesthesia review: yes- ICU admit pneumonia 05/14/23;07/06/23-UTI;06/11/23 and 07/21/23-blood transfusions in ED for Hgb 6.0; 07/22/23 Hgb 6.9 in PAT  Patient denies shortness of breath, fever, cough and chest pain at PAT appointment   All instructions explained to the patient, with a verbal understanding of the material. Patient agrees to go over the instructions while at home for a better understanding. The opportunity to ask questions was provided.

## 2023-07-22 NOTE — Progress Notes (Signed)
 Anesthesia Chart Review:  Case: [id number] Date/Time: 08/01/23 1038   Procedure: TRANSFORAMINAL LUMBAR INTERBODY FUSION (TLIF) WITH PEDICLE SCREW FIXATION 1 LEVEL (Left) - LEFT-SIDED LUMBAR 5 - SACRUM 1 TRANSFORAMINAL LUMBAR INTERBODY FUSION AND DECOMPRESSION WITH INSTRUMENTATION AND ALLOGRAFT   Anesthesia type: General   Diagnosis: Lumbar radiculopathy [M54.16]   Pre-op diagnosis: LUMBAR RADICULOPATHY   [facility] / [facility]   Surgeons: Melinda Grimes, Melinda Stone       DISCUSSION: Patient is a 43 year old female scheduled for the above procedure.   History includes former smoker (quit 12/23/21), DM2, polyneuropathy, anemia, menorrhagia, uterine fibroid, GERD, chronic back pain, spinal surgery.  Admission 05/11/23-05/23/23 for acute hypoxic respiratory failure due to influenza A pneumonia and asthma exacerbation, requiring intubation. Discharged to [facility] 05/23/23-05/29/23.   Admission 03/25/23 - 03/27/23 for sepsis related to possible UTI and left 5th toe cellulitis. Chest CT was negative for PE but concerning for possible atypical or viral pneumonia. Patient was initiated on antibiotics for community-acquired pneumonia, ultimately procalcitonin was negative and thus Zithromax  was discontinued. She was continued on ceftriaxone  for cellulitis and UTI.   She has had ongoing issues with anemia (HGB as low as 5.1) in setting of uterine fibroid and heavy menses. Last ED visit 07/21/23 with HGB 6.0, s/p PRBC. Discussed with Abe Abed at Dr. Wallace Gully office. Would advised coordinating care with hematology and gynecology to optimize blood counts prior to back surgery. She was recently started on Megace. There is mention of IUD consideration. She has also received Venofer  and is scheduled for Recrit on 07/22/24. She has GYN follow-up on 07/30/23.   In addition recent visit with cardiologist Dr. Albert Huff for LE edema and elevated BNP (1695 on 07/16/23). Echo 05/13/23 showed LVEF 65-70%, no RWMA, moderate LVH, grade 1  DD, normal RVSF, trivial MR. No chest pain, but + dyspnea at rest and with activities. Chronic 3 pillow orthopnea. No overt HF on exam, but he suspected a degree of HFpEF given symptoms, edema, RAP of 15 mmHg. Anemia likely a contributor to dyspnea. Losartan  25 mg started. Six month follow-up planned. Larance Plater to also reach out to cardiology regarding upcoming surgery, although hopefully dyspnea will improve if her anemia improves.   Abe Abed discussed with Dr. Jackee Marus. He is planning to postpone surgery until she has anemia improved with follow-up with GYN and HEM. She will contact the patient and OR scheduling.    VS: BP 126/73   Pulse 90   Temp 36.7 C   Resp 18   Ht 5\' 9"  (1.753 m)   Wt 99.8 kg   LMP 07/19/2023 (Exact Date)   SpO2 100%   BMI 32.49 kg/m    PROVIDERS: Marcial Setting, NP is PCP  Timmy Forbes, Melinda Stone is Theotis Flake, Melinda Stone is GYN Olinda Bertrand, DO is cardiologist [facility], Marilyne Shu, Melinda Stone is endocrinologist. Last visit 06/21/23. A1c 5.0%, but skewed due to iron  infusions. Intolerant to Trulicity  and Ozempic  due to abdominal pain, developed DKA on Jardiance  in 2024, and started on insulin  in 2/205 following hospitalization. Continue glipizide  10 mg BID and Lantus  40 units daily.    LABS: See DISCUSSION.  ordered are listed, but only abnormal results are displayed)  Labs Reviewed  HEMOGLOBIN A1C - Abnormal; Notable for the following components:      Result Value   Hgb A1c MFr Bld 4.6 (*)    All other components within normal limits  BASIC METABOLIC PANEL WITH GFR - Abnormal; Notable for the following components:  Chloride 112 (*)    CO2 18 (*)    Glucose, Bld 254 (*)    BUN 24 (*)    Creatinine, Ser 1.04 (*)    Calcium  8.1 (*)    All other components within normal limits  CBC - Abnormal; Notable for the following components:   RBC 2.49 (*)    Hemoglobin 6.9 (*)    HCT 20.3 (*)    RDW 16.9 (*)    All other components within normal limits  GLUCOSE, CAPILLARY -  Abnormal; Notable for the following components:   Glucose-Capillary 225 (*)    All other components within normal limits  SURGICAL PCR SCREEN     IMAGES: MRI L-spine 02/07/23: IMPRESSION: 1. Postoperative changes with a wide decompressive laminectomy at L4-5. Stable degenerative disc disease and facet disease but no significant spinal or foraminal stenosis. 2. Postoperative changes with a laminectomy defect and partial left facetectomy and foraminotomy at L5-S1. Findings consistent with a progressive recurrent disc protrusion likely affecting the left S1 nerve root. The left S1 nerve root is moderately enlarged suggesting inflammation/edema. Recommend correlation with left S1 radiculopathy.    EKG: EKG 07/21/23: Sinus rhythm Low voltage, precordial leads Confirmed by Lowery Rue [phone]) on 07/22/2023 11:48:02 AM   CV: Echo 05/13/23: IMPRESSIONS   1. Left ventricular ejection fraction, by estimation, is 65 to 70%. The  left ventricle has normal function. The left ventricle has no regional  wall motion abnormalities. There is moderate left ventricular hypertrophy.  Left ventricular diastolic  parameters are consistent with Grade I diastolic dysfunction (impaired  relaxation).   2. Right ventricular systolic function is normal. The right ventricular  size is normal. Tricuspid regurgitation signal is inadequate for assessing  PA pressure.   3. The mitral valve is normal in structure. Trivial mitral valve  regurgitation. No evidence of mitral stenosis.   4. The aortic valve is tricuspid. Aortic valve regurgitation is not  visualized. Aortic valve sclerosis/calcification is present, without any  evidence of aortic stenosis.   5. The inferior vena cava is dilated in size with <50% respiratory  variability, suggesting right atrial pressure of 15 mmHg.    Past Medical History:  Diagnosis Date   Anemia    Arthritis 08/17/2020   Chronic back pain    Diabetes mellitus without  complication (HCC)    type 2   GERD (gastroesophageal reflux disease)    Migraine    Ovarian cyst    Pneumonia    Polyneuropathy     Past Surgical History:  Procedure Laterality Date   CESAREAN SECTION     CHOLECYSTECTOMY  2004   EYE SURGERY Left    cataract   LUMBAR LAMINECTOMY N/A 05/30/2020   Procedure: Lumbar five Laminectomy,  Bilateral Microdiscectomy, Left Lumbar five -Sacral one Microdiscectomy;  Surgeon: Adah Acron, Melinda Stone;  Location: [facility] OR;  Service: Orthopedics;  Laterality: N/A;   LUMBAR LAMINECTOMY Left 11/14/2020   Procedure: LEFT LUMBAR FOUR-FIVE MICRODISCECTOMY;  Surgeon: Adah Acron, Melinda Stone;  Location: [facility] OR;  Service: Orthopedics;  Laterality: Left;   TUBAL LIGATION  10/19/2010    MEDICATIONS:  acetaminophen  (TYLENOL ) 500 MG tablet   albuterol  (VENTOLIN  HFA) 108 (90 Base) MCG/ACT inhaler   amitriptyline  (ELAVIL ) 25 MG tablet   Blood Glucose Monitoring Suppl (ONETOUCH VERIO FLEX SYSTEM) w/Device KIT   Continuous Glucose Sensor (DEXCOM G7 SENSOR) MISC   Cyanocobalamin  (VITAMIN B-12) 2500 MCG SUBL   cyclobenzaprine  (FLEXERIL ) 5 MG tablet   diclofenac  Sodium (VOLTAREN ) 1 %  GEL   doxycycline  (VIBRAMYCIN ) 100 MG capsule   ferrous sulfate  325 (65 FE) MG tablet   gabapentin  (NEURONTIN ) 800 MG tablet   glipiZIDE  (GLUCOTROL ) 10 MG tablet   insulin  glargine (LANTUS  SOLOSTAR) 100 UNIT/ML Solostar Pen   Insulin  Pen Needle 32G X 4 MM MISC   Lancets (ONETOUCH DELICA PLUS LANCET33G) MISC   losartan  (COZAAR ) 25 MG tablet   megestrol (MEGACE) 40 MG tablet   metoprolol  tartrate (LOPRESSOR ) 25 MG tablet   mometasone -formoterol  (DULERA ) 100-5 MCG/ACT AERO   Multiple Vitamin (MULTIVITAMIN WITH MINERALS) TABS tablet   ONETOUCH VERIO test strip   oxyCODONE -acetaminophen  (PERCOCET/ROXICET) 5-325 MG tablet   pantoprazole  (PROTONIX ) 40 MG tablet   predniSONE  (DELTASONE ) 10 MG tablet   tiZANidine  (ZANAFLEX ) 4 MG tablet   Ubrogepant  (UBRELVY ) 100 MG TABS   vitamin C  (ASCORBIC ACID )  250 MG tablet   No current facility-administered medications for this encounter.    Ella Gun, PA-C Surgical Short Stay/Anesthesiology [facility] Phone [phone] [facility] Phone [phone] 07/22/2023 3:25 PM

## 2023-07-23 ENCOUNTER — Encounter: Payer: Self-pay | Admitting: Oncology

## 2023-07-23 ENCOUNTER — Other Ambulatory Visit: Payer: Self-pay

## 2023-07-23 ENCOUNTER — Inpatient Hospital Stay

## 2023-07-23 ENCOUNTER — Emergency Department (HOSPITAL_COMMUNITY)
Admission: EM | Admit: 2023-07-23 | Discharge: 2023-07-24 | Disposition: A | Discharge status: Home / Self Care | Attending: Emergency Medicine | Admitting: Emergency Medicine

## 2023-07-23 ENCOUNTER — Telehealth: Payer: Self-pay

## 2023-07-23 ENCOUNTER — Encounter (HOSPITAL_COMMUNITY): Payer: Self-pay | Admitting: Emergency Medicine

## 2023-07-23 ENCOUNTER — Inpatient Hospital Stay (HOSPITAL_BASED_OUTPATIENT_CLINIC_OR_DEPARTMENT_OTHER): Admitting: Oncology

## 2023-07-23 VITALS — BP 148/78 | HR 97 | Temp 96.9°F | Resp 18 | Wt 223.2 lb

## 2023-07-23 DIAGNOSIS — D509 Iron deficiency anemia, unspecified: Secondary | ICD-10-CM | POA: Insufficient documentation

## 2023-07-23 DIAGNOSIS — R160 Hepatomegaly, not elsewhere classified: Secondary | ICD-10-CM | POA: Insufficient documentation

## 2023-07-23 DIAGNOSIS — D519 Vitamin B12 deficiency anemia, unspecified: Secondary | ICD-10-CM

## 2023-07-23 DIAGNOSIS — D649 Anemia, unspecified: Secondary | ICD-10-CM | POA: Insufficient documentation

## 2023-07-23 DIAGNOSIS — Z79818 Long term (current) use of other agents affecting estrogen receptors and estrogen levels: Secondary | ICD-10-CM | POA: Diagnosis not present

## 2023-07-23 DIAGNOSIS — Z87891 Personal history of nicotine dependence: Secondary | ICD-10-CM | POA: Insufficient documentation

## 2023-07-23 DIAGNOSIS — R0602 Shortness of breath: Secondary | ICD-10-CM | POA: Diagnosis not present

## 2023-07-23 DIAGNOSIS — N920 Excessive and frequent menstruation with regular cycle: Secondary | ICD-10-CM | POA: Diagnosis not present

## 2023-07-23 DIAGNOSIS — D508 Other iron deficiency anemias: Secondary | ICD-10-CM | POA: Diagnosis not present

## 2023-07-23 DIAGNOSIS — R531 Weakness: Secondary | ICD-10-CM | POA: Diagnosis not present

## 2023-07-23 DIAGNOSIS — R42 Dizziness and giddiness: Secondary | ICD-10-CM | POA: Diagnosis present

## 2023-07-23 LAB — FERRITIN: Ferritin: 206 ng/mL (ref 11–307)

## 2023-07-23 LAB — CBC WITH DIFFERENTIAL (CANCER CENTER ONLY)
Abs Immature Granulocytes: 0.02 10*3/uL (ref 0.00–0.07)
Basophils Absolute: 0.1 10*3/uL (ref 0.0–0.1)
Basophils Relative: 1 %
Eosinophils Absolute: 0 10*3/uL (ref 0.0–0.5)
Eosinophils Relative: 1 %
HCT: 17.9 % — ABNORMAL LOW (ref 36.0–46.0)
Hemoglobin: 6.3 g/dL — CL (ref 12.0–15.0)
Immature Granulocytes: 0 %
Lymphocytes Relative: 22 %
Lymphs Abs: 1.5 10*3/uL (ref 0.7–4.0)
MCH: 28 pg (ref 26.0–34.0)
MCHC: 35.2 g/dL (ref 30.0–36.0)
MCV: 79.6 fL — ABNORMAL LOW (ref 80.0–100.0)
Monocytes Absolute: 0.4 10*3/uL (ref 0.1–1.0)
Monocytes Relative: 6 %
Neutro Abs: 4.8 10*3/uL (ref 1.7–7.7)
Neutrophils Relative %: 70 %
Platelet Count: 160 10*3/uL (ref 150–400)
RBC: 2.25 MIL/uL — ABNORMAL LOW (ref 3.87–5.11)
RDW: 17.1 % — ABNORMAL HIGH (ref 11.5–15.5)
WBC Count: 6.8 10*3/uL (ref 4.0–10.5)
nRBC: 0 % (ref 0.0–0.2)

## 2023-07-23 LAB — RETIC PANEL
Immature Retic Fract: 10.7 % (ref 2.3–15.9)
RBC.: 2.2 MIL/uL — ABNORMAL LOW (ref 3.87–5.11)
Retic Count, Absolute: 260.5 10*3/uL — ABNORMAL HIGH (ref 19.0–186.0)
Retic Ct Pct: 11.8 % — ABNORMAL HIGH (ref 0.4–3.1)
Reticulocyte Hemoglobin: 29.9 pg (ref >27.9)

## 2023-07-23 LAB — HEPATIC FUNCTION PANEL
ALT: 14 U/L (ref 0–44)
AST: 19 U/L (ref 15–41)
Albumin: 3.1 g/dL — ABNORMAL LOW (ref 3.5–5.0)
Alkaline Phosphatase: 111 U/L (ref 38–126)
Bilirubin, Direct: 0.2 mg/dL (ref 0.0–0.2)
Indirect Bilirubin: 0.7 mg/dL (ref 0.3–0.9)
Total Bilirubin: 0.9 mg/dL (ref 0.0–1.2)
Total Protein: 6.2 g/dL — ABNORMAL LOW (ref 6.5–8.1)

## 2023-07-23 LAB — HEMOGLOBIN AND HEMATOCRIT (CANCER CENTER ONLY)
HCT: 17.9 % — ABNORMAL LOW (ref 36.0–46.0)
Hemoglobin: 6.3 g/dL — CL (ref 12.0–15.0)

## 2023-07-23 LAB — DIRECT ANTIGLOBULIN TEST (NOT AT ARMC)
DAT, IgG: NEGATIVE
DAT, complement: NEGATIVE

## 2023-07-23 LAB — COMPREHENSIVE METABOLIC PANEL WITH GFR
ALT: 16 U/L (ref 0–44)
AST: 21 U/L (ref 15–41)
Albumin: 3.3 g/dL — ABNORMAL LOW (ref 3.5–5.0)
Alkaline Phosphatase: 115 U/L (ref 38–126)
Anion gap: 5 (ref 5–15)
BUN: 23 mg/dL — ABNORMAL HIGH (ref 6–20)
CO2: 19 mmol/L — ABNORMAL LOW (ref 22–32)
Calcium: 8.1 mg/dL — ABNORMAL LOW (ref 8.9–10.3)
Chloride: 112 mmol/L — ABNORMAL HIGH (ref 98–111)
Creatinine, Ser: 1.23 mg/dL — ABNORMAL HIGH (ref 0.44–1.00)
GFR, Estimated: 57 mL/min — ABNORMAL LOW (ref >60)
Glucose, Bld: 184 mg/dL — ABNORMAL HIGH (ref 70–99)
Potassium: 4.3 mmol/L (ref 3.5–5.1)
Sodium: 136 mmol/L (ref 135–145)
Total Bilirubin: 1.5 mg/dL — ABNORMAL HIGH (ref 0.0–1.2)
Total Protein: 6.7 g/dL (ref 6.5–8.1)

## 2023-07-23 LAB — CBC
HCT: 20.8 % — ABNORMAL LOW (ref 36.0–46.0)
Hemoglobin: 7.1 g/dL — ABNORMAL LOW (ref 12.0–15.0)
MCH: 28.2 pg (ref 26.0–34.0)
MCHC: 34.1 g/dL (ref 30.0–36.0)
MCV: 82.5 fL (ref 80.0–100.0)
Platelets: 186 10*3/uL (ref 150–400)
RBC: 2.52 MIL/uL — ABNORMAL LOW (ref 3.87–5.11)
RDW: 17.8 % — ABNORMAL HIGH (ref 11.5–15.5)
WBC: 9.6 10*3/uL (ref 4.0–10.5)
nRBC: 0 % (ref 0.0–0.2)

## 2023-07-23 LAB — SAMPLE TO BLOOD BANK

## 2023-07-23 LAB — LACTATE DEHYDROGENASE: LDH: 222 U/L — ABNORMAL HIGH (ref 98–192)

## 2023-07-23 LAB — IRON AND TIBC
Iron: 90 ug/dL (ref 28–170)
Saturation Ratios: 26 % (ref 10.4–31.8)
TIBC: 353 ug/dL (ref 250–450)
UIBC: 263 ug/dL

## 2023-07-23 LAB — PREPARE RBC (CROSSMATCH)

## 2023-07-23 MED ORDER — SODIUM CHLORIDE 0.9% IV SOLUTION: Freq: Once | INTRAVENOUS | Status: AC

## 2023-07-23 NOTE — ED Triage Notes (Signed)
 Pt reports heavy cycle that ended on Sunday. Reports Dr. Liana Reding and reported her hgb was 6.3. Pt reports feeling SHOB.

## 2023-07-23 NOTE — Assessment & Plan Note (Addendum)
 Patient tolerates IV Venofer  treatments previously. Labs reviewed and discussed with patient. Lab Results  Component Value Date   HGB 6.9 (LL) 07/22/2023   TIBC 328 06/14/2023   IRONPCTSAT 38 (H) 06/14/2023   FERRITIN 240 06/14/2023    Hemoglobin remains low. Last iron  panel is not consistent with IDA. Repeat levels today

## 2023-07-23 NOTE — Assessment & Plan Note (Addendum)
 Possibly due to fatty liver disease.  Negative CMV HIV parvovirus Check BCR ABL1 FISH, JAK2 mutation.

## 2023-07-23 NOTE — Telephone Encounter (Signed)
 Patient will need bone marrow biopsy. Request sent to IR.

## 2023-07-23 NOTE — Progress Notes (Signed)
 Hematology/Oncology Progress note [phone] [phone]            Patient Care Team: Marcial Setting, NP as PCP - General (Nurse Practitioner) Olinda Bertrand, DO as PCP - Cardiology (Cardiology) Timmy Forbes, MD as Consulting Physician (Oncology)  ASSESSMENT & PLAN:   IDA (iron  deficiency anemia) Patient tolerates IV Venofer  treatments previously. Labs reviewed and discussed with patient. Lab Results  Component Value Date   HGB 6.9 (LL) 07/22/2023   TIBC 328 06/14/2023   IRONPCTSAT 38 (H) 06/14/2023   FERRITIN 240 06/14/2023    Hemoglobin remains low. Last iron  panel is not consistent with IDA. Repeat levels today     Hepatomegaly Possibly due to fatty liver disease.  Negative CMV HIV parvovirus Check BCR ABL1 FISH, JAK2 mutation.  Normocytic anemia Refractory to blood transfusion.  low haptoglobin, normal Ldh, normal bilirubin.  no M protein on myeloma panel, negative parvo virus DNA PCR, CMV ? Ineffective erythropoiesis.  I will obtain bone marrow biopsy asap  Recommend 1 unit of PRBC transfusion. She declines to set up transfusion outpatient at our infusion center tomorrow. She lives close to [facility] and plans to go to ER there today for blood transfusion.     Orders Placed This Encounter  Procedures   Retic Panel    Standing Status:   Future    Number of Occurrences:   1    Expected Date:   07/23/2023    Expiration Date:   07/22/2024   CBC with Differential (Cancer Center Only)    Standing Status:   Future    Number of Occurrences:   1    Expected Date:   07/23/2023    Expiration Date:   07/22/2024   Ferritin    Standing Status:   Future    Expected Date:   07/23/2023    Expiration Date:   01/23/2024   Iron  and TIBC    Standing Status:   Future    Expected Date:   07/23/2023    Expiration Date:   07/22/2024   Hepatic function panel    Standing Status:   Future    Expected Date:   07/23/2023    Expiration Date:   07/22/2024    Haptoglobin    Standing Status:   Future    Expected Date:   07/23/2023    Expiration Date:   07/22/2024   Flow cytometry panel-leukemia/lymphoma work-up    Standing Status:   Future    Expected Date:   07/23/2023    Expiration Date:   07/22/2024   Lactate dehydrogenase    Standing Status:   Future    Expected Date:   07/23/2023    Expiration Date:   07/22/2024   BCR-ABL1 FISH    Standing Status:   Future    Expected Date:   07/23/2023    Expiration Date:   07/22/2024   JAK2 V617F rfx CALR/MPL/E12-15    Standing Status:   Future    Expected Date:   07/23/2023    Expiration Date:   07/22/2024   PNH Profile (-High Sensitivity)    Standing Status:   Future    Expected Date:   07/23/2023    Expiration Date:   07/22/2024   DAT, polyspecific, AHG ([facility] only)    Standing Status:   Future    Expected Date:   07/23/2023    Expiration Date:   07/22/2024   Direct antiglobulin test (not at [facility])    Standing Status:  Future    Expected Date:   07/23/2023    Expiration Date:   07/22/2024    Follow-up TBD  All questions were answered. The patient knows to call the clinic with any problems, questions or concerns.  Timmy Forbes, MD, PhD [facility] Hematology Oncology 07/23/2023   CHIEF COMPLAINTS/REASON FOR VISIT:  iron  deficiency anemia  HISTORY OF PRESENTING ILLNESS:   Melinda Stone is a  43 y.o.  female presents for iron  deficiency anemia.  Patient had blood work done on 07/13/2021, CBC showed hemoglobin of 9.0, MCV 75.3, ferritin 14, iron  saturation 21, TIBC 455.  Reviewed her previous lab records.  Patient has chronic anemia since at least 2019.  + Nausea, no vomiting.  Chronic diarrhea after each meal.  Unintentional weight loss Patient reports that she has had colonoscopy done in May which did not reveal etiology of diarrhea.  Colonoscopy record was not available to me at the time of dictation. 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  05/11/2023 -  05/23/2023 Patient was hospitalized due to hypoxic respiratory failure, influenza A, ARDS, pneumonia and she was discharged to rehab after hospitalization.  INTERVAL HISTORY Melinda Stone is a 43 y.o. female who has above history reviewed by me today presents for follow up visit for iron  deficiency anemia Patient takes oral vitamin B12 supplementation at 1000 mcg daily. Chronic fatigue chronic diarrhea, unchanged. No fever, chills, unintentional weight loss, night sweats.  Denies hematochezia, hematuria, hematemesis, epistaxis, black tarry stool or easy bruising.  Recent ER visit, she has heavy menstrual bleeding and was seen by Gyn. Prescribed Megace.   Review of Systems  Constitutional:  Positive for fatigue. Negative for chills and fever.  HENT:   Negative for hearing loss and voice change.   Eyes:  Negative for eye problems.  Respiratory:  Negative for chest tightness and cough.   Cardiovascular:  Negative for chest pain.  Gastrointestinal:  Negative for abdominal distention, abdominal pain, blood in stool, diarrhea, nausea and vomiting.  Endocrine: Negative for hot flashes.  Genitourinary:  Positive for menstrual problem. Negative for difficulty urinating and frequency.   Musculoskeletal:  Positive for back pain. Negative for arthralgias.  Skin:  Negative for itching and rash.  Neurological:  Positive for numbness. Negative for extremity weakness.  Hematological:  Negative for adenopathy.  Psychiatric/Behavioral:  Negative for confusion.     MEDICAL HISTORY:  Past Medical History:  Diagnosis Date   Anemia    Arthritis 08/17/2020   Chronic back pain    Diabetes mellitus without complication (HCC)    type 2   GERD (gastroesophageal reflux disease)    Migraine    Ovarian cyst    Pneumonia    Polyneuropathy     SURGICAL HISTORY: Past Surgical History:  Procedure Laterality Date   CESAREAN SECTION     CHOLECYSTECTOMY  2004   EYE SURGERY Left    cataract   LUMBAR  LAMINECTOMY N/A 05/30/2020   Procedure: Lumbar five Laminectomy,  Bilateral Microdiscectomy, Left Lumbar five -Sacral one Microdiscectomy;  Surgeon: Adah Acron, MD;  Location: [facility] OR;  Service: Orthopedics;  Laterality: N/A;   LUMBAR LAMINECTOMY Left 11/14/2020   Procedure: LEFT LUMBAR FOUR-FIVE MICRODISCECTOMY;  Surgeon: Adah Acron, MD;  Location: [facility] OR;  Service: Orthopedics;  Laterality: Left;   TUBAL LIGATION  10/19/2010    SOCIAL HISTORY: Social History   Socioeconomic History   Marital status: Single    Spouse name: Not on file  Number of children: 3   Years of education: Not on file   Highest education level: High school graduate  Occupational History    Comment: home maker  Tobacco Use   Smoking status: Former    Types: Cigars    Quit date: 12/23/2021    Years since quitting: 1.5   Smokeless tobacco: Never   Tobacco comments:    2 cigars daily. Khj 02/12/2023  Vaping Use   Vaping status: Never Used  Substance and Sexual Activity   Alcohol  use: Not Currently   Drug use: Not Currently    Comment: per patient stopped smoking marijuana Mar 2022   Sexual activity: Yes    Birth control/protection: Surgical  Other Topics Concern   Not on file  Social History Narrative   Lives with 3 children   Some coffee   Social Drivers of Corporate investment banker Strain: Not on file  Food Insecurity: Patient Unable To Answer (05/15/2023)   Hunger Vital Sign    Worried About Running Out of Food in the Last Year: Patient unable to answer    Ran Out of Food in the Last Year: Patient unable to answer  Transportation Needs: Patient Unable To Answer (05/15/2023)   PRAPARE - Transportation    Lack of Transportation (Medical): Patient unable to answer    Lack of Transportation (Non-Medical): Patient unable to answer  Physical Activity: Not on file  Stress: Not on file  Social Connections: Socially Isolated (03/26/2023)   Social Connection and Isolation Panel [NHANES]     Frequency of Communication with Friends and Family: Never    Frequency of Social Gatherings with Friends and Family: Never    Attends Religious Services: Never    Database administrator or Organizations: No    Attends Banker Meetings: Never    Marital Status: Never married  Intimate Partner Violence: Patient Unable To Answer (05/15/2023)   Humiliation, Afraid, Rape, and Kick questionnaire    Fear of Current or Ex-Partner: Patient unable to answer    Emotionally Abused: Patient unable to answer    Physically Abused: Patient unable to answer    Sexually Abused: Patient unable to answer    FAMILY HISTORY: Family History  Problem Relation Age of Onset   Diabetes Mother    Hypertension Mother    Stroke Mother     ALLERGIES:  is allergic to trulicity  [dulaglutide ].  MEDICATIONS:  Current Outpatient Medications  Medication Sig Dispense Refill   acetaminophen  (TYLENOL ) 500 MG tablet Take 500 mg by mouth every 6 (six) hours as needed for mild pain (pain score 1-3) or moderate pain (pain score 4-6).     albuterol  (VENTOLIN  HFA) 108 (90 Base) MCG/ACT inhaler Inhale 2 puffs into the lungs every 6 (six) hours as needed for wheezing or shortness of breath. 18 g 2   amitriptyline  (ELAVIL ) 25 MG tablet Take 1 tablet (25 mg total) by mouth at bedtime. 30 tablet 6   Blood Glucose Monitoring Suppl (ONETOUCH VERIO FLEX SYSTEM) w/Device KIT 3 (three) times daily.     Continuous Glucose Sensor (DEXCOM G7 SENSOR) MISC 1 Device by Does not apply route as directed. Change sensor every 10 days 9 each 3   Cyanocobalamin  (VITAMIN B-12) 2500 MCG SUBL Place 1 tablet (2,500 mcg total) under the tongue daily. 30 tablet 2   doxycycline  (VIBRAMYCIN ) 100 MG capsule Take 1 capsule (100 mg total) by mouth 2 (two) times daily. 20 capsule 0   ferrous sulfate  325 (65  FE) MG tablet Take 1 tablet (325 mg total) by mouth daily with breakfast. 30 tablet 0   gabapentin  (NEURONTIN ) 800 MG tablet Take 800 mg by  mouth 3 (three) times daily.     glipiZIDE  (GLUCOTROL ) 10 MG tablet Take 2 tablets (20 mg total) by mouth daily before breakfast AND 1 tablet (10 mg total) daily before supper. 270 tablet 3   insulin  glargine (LANTUS  SOLOSTAR) 100 UNIT/ML Solostar Pen Inject 40 Units into the skin daily before breakfast. 45 mL 2   Insulin  Pen Needle 32G X 4 MM MISC 1 Device by Does not apply route daily in the afternoon. 100 each 3   Lancets (ONETOUCH DELICA PLUS LANCET33G) MISC Apply topically 3 (three) times daily.     losartan  (COZAAR ) 25 MG tablet Take 1 tablet (25 mg total) by mouth daily. 30 tablet 3   megestrol (MEGACE) 40 MG tablet Take 1 tablet (40 mg total) by mouth daily. 30 tablet 0   metoprolol  tartrate (LOPRESSOR ) 25 MG tablet Take 1 tablet (25 mg total) by mouth 2 (two) times daily. 60 tablet 0   mometasone -formoterol  (DULERA ) 100-5 MCG/ACT AERO Inhale 2 puffs into the lungs 2 (two) times daily at 10 AM and 5 PM. 13 g 0   Multiple Vitamin (MULTIVITAMIN WITH MINERALS) TABS tablet Take 1 tablet by mouth daily.     ONETOUCH VERIO test strip 3 (three) times daily.     oxyCODONE -acetaminophen  (PERCOCET/ROXICET) 5-325 MG tablet Take 1 tablet by mouth 4 (four) times daily as needed for moderate pain (pain score 4-6) or severe pain (pain score 7-10).     pantoprazole  (PROTONIX ) 40 MG tablet Take 1 tablet (40 mg total) by mouth daily. 30 tablet 0   predniSONE  (DELTASONE ) 10 MG tablet Take 2 tablets (20 mg total) by mouth daily. 10 tablet 0   tiZANidine  (ZANAFLEX ) 4 MG tablet Take 4 mg by mouth every 12 (twelve) hours as needed for muscle spasms.     vitamin C  (ASCORBIC ACID ) 250 MG tablet Take 2 tablets (500 mg total) by mouth daily. (Patient taking differently: Take 250 mg by mouth daily.) 180 tablet 0   cyclobenzaprine  (FLEXERIL ) 5 MG tablet Take 1 tablet (5 mg total) by mouth 3 (three) times daily as needed for muscle spasms. (Patient not taking: Reported on 07/18/2023) 30 tablet 0   diclofenac  Sodium  (VOLTAREN ) 1 % GEL Apply 2 g topically 3 (three) times daily. (Patient not taking: Reported on 07/18/2023)     Ubrogepant  (UBRELVY ) 100 MG TABS Take 1 tablet (100 mg total) by mouth as needed. Take 1 tablet at onset of headache, may repeat in 2 hours if needed. Max is 200 mg in 24 hours. (Patient not taking: Reported on 07/18/2023) 12 tablet 11   No current facility-administered medications for this visit.     PHYSICAL EXAMINATION: ECOG PERFORMANCE STATUS: 2 - Symptomatic, <50% confined to bed Vitals:   07/23/23 1444  BP: (!) 148/78  Pulse: 97  Resp: 18  Temp: (!) 96.9 F (36.1 C)  SpO2: 100%   Filed Weights   07/23/23 1444  Weight: 223 lb 3.2 oz (101.2 kg)      Physical Exam Constitutional:      General: She is not in acute distress.    Comments: Patient sits in the wheelchair  HENT:     Head: Normocephalic and atraumatic.  Eyes:     General: No scleral icterus. Cardiovascular:     Rate and Rhythm: Normal rate and regular rhythm.  Heart sounds: Normal heart sounds.  Pulmonary:     Effort: Pulmonary effort is normal. No respiratory distress.     Breath sounds: No wheezing.  Abdominal:     General: Bowel sounds are normal. There is no distension.     Palpations: Abdomen is soft. There is no mass.  Musculoskeletal:        General: No deformity. Normal range of motion.     Cervical back: Normal range of motion and neck supple.  Skin:    General: Skin is warm and dry.     Coloration: Skin is pale.     Findings: No erythema or rash.  Neurological:     Mental Status: She is alert and oriented to person, place, and time. Mental status is at baseline.  Psychiatric:        Mood and Affect: Mood normal.     LABORATORY DATA:  I have reviewed the data as listed    Latest Ref Rng & Units 07/23/2023    2:30 PM 07/22/2023    2:30 PM 07/21/2023   10:20 AM  CBC  WBC 4.0 - 10.5 K/uL  9.6  6.4   Hemoglobin 12.0 - 15.0 g/dL 6.3  6.9  6.0   Hematocrit 36.0 - 46.0 % 17.9  20.3   18.2   Platelets 150 - 400 K/uL  185  164       Latest Ref Rng & Units 07/22/2023    2:30 PM 07/21/2023   10:20 AM 07/16/2023   10:40 AM  CMP  Glucose 70 - 99 mg/dL 914  782  956   BUN 6 - 20 mg/dL 24  25  20    Creatinine 0.44 - 1.00 mg/dL 2.13  0.86  5.78   Sodium 135 - 145 mmol/L 137  136  139   Potassium 3.5 - 5.1 mmol/L 4.8  4.6  4.9   Chloride 98 - 111 mmol/L 112  112  113   CO2 22 - 32 mmol/L 18  16  14    Calcium  8.9 - 10.3 mg/dL 8.1  8.1  8.0      Iron /TIBC/Ferritin/ %Sat    Component Value Date/Time   IRON  123 06/14/2023 1155   IRON  64 01/02/2019 0955   TIBC 328 06/14/2023 1155   TIBC 382 01/02/2019 0955   FERRITIN 240 06/14/2023 1155   FERRITIN 21 01/02/2019 0955   IRONPCTSAT 38 (H) 06/14/2023 1155   IRONPCTSAT 17 01/02/2019 0955      RADIOGRAPHIC STUDIES: I have personally reviewed the radiological images as listed and agreed with the findings in the report. No results found.

## 2023-07-23 NOTE — ED Notes (Signed)
I attempted to collect labs and was unsuccessful. 

## 2023-07-23 NOTE — Assessment & Plan Note (Addendum)
 Refractory to blood transfusion.  low haptoglobin, normal Ldh, normal bilirubin.  no M protein on myeloma panel, negative parvo virus DNA PCR, CMV ? Ineffective erythropoiesis vs hemolysis. Check DAT, repeat LFT, haptoglobin, PNH I will obtain bone marrow biopsy asap  Recommend 1 unit of PRBC transfusion. She declines to set up transfusion outpatient at our infusion center tomorrow. She lives close to [facility] and plans to go to ER there today for blood transfusion.

## 2023-07-23 NOTE — ED Provider Notes (Signed)
 [place] EMERGENCY DEPARTMENT AT [facility] Provider Note   CSN: [id number] Arrival date & time: 07/23/23  1831     History  Chief Complaint  Patient presents with   Abnormal Lab    Melinda Stone is a 43 y.o. female presenting to the Emergency Department with complaint of lightheadedness, feels that she is anemic.  She has a history of heavy menorrhagia and has had multiple transfusions in the past for this.  She was seen by her OB/GYN Sunday 2 days ago who was trying to get her scheduled for an operation for heavy bleeding but unfortunately her hemoglobin remains significantly anemic.  She says her hemoglobin dropped again today and was 6.3.  She was started on Megace and has been taking this and reports that her bleeding has eased up in a line but she still feels lightheaded and short of breath  HPI     Home Medications Prior to Admission medications   Medication Sig Start Date End Date Taking? Authorizing Provider  acetaminophen  (TYLENOL ) 500 MG tablet Take 500 mg by mouth every 6 (six) hours as needed for mild pain (pain score 1-3) or moderate pain (pain score 4-6).    [provider]  albuterol  (VENTOLIN  HFA) 108 (90 Base) MCG/ACT inhaler Inhale 2 puffs into the lungs every 6 (six) hours as needed for wheezing or shortness of breath. 05/29/23   Setzer, Sandra J, PA-C  amitriptyline  (ELAVIL ) 25 MG tablet Take 1 tablet (25 mg total) by mouth at bedtime. 01/31/23   Wess Hammed, NP  Blood Glucose Monitoring Suppl (ONETOUCH VERIO FLEX SYSTEM) w/Device KIT 3 (three) times daily. 06/14/23   [provider]  Continuous Glucose Sensor (DEXCOM G7 SENSOR) MISC 1 Device by Does not apply route as directed. Change sensor every 10 days 06/21/23   Shamleffer, Ibtehal Jaralla, MD  Cyanocobalamin  (VITAMIN B-12) 2500 MCG SUBL Place 1 tablet (2,500 mcg total) under the tongue daily. 04/23/23   Timmy Forbes, MD  cyclobenzaprine  (FLEXERIL ) 5 MG tablet Take 1 tablet (5  mg total) by mouth 3 (three) times daily as needed for muscle spasms. Patient not taking: Reported on 07/18/2023 05/29/23   Setzer, Sandra J, PA-C  diclofenac  Sodium (VOLTAREN ) 1 % GEL Apply 2 g topically 3 (three) times daily. Patient not taking: Reported on 07/18/2023 05/29/23   Setzer, Sandra J, PA-C  doxycycline  (VIBRAMYCIN ) 100 MG capsule Take 1 capsule (100 mg total) by mouth 2 (two) times daily. 07/21/23   Auston Blush, MD  ferrous sulfate  325 (65 FE) MG tablet Take 1 tablet (325 mg total) by mouth daily with breakfast. 05/29/23   Setzer, Sandra J, PA-C  gabapentin  (NEURONTIN ) 800 MG tablet Take 800 mg by mouth 3 (three) times daily.    [provider]  glipiZIDE  (GLUCOTROL ) 10 MG tablet Take 2 tablets (20 mg total) by mouth daily before breakfast AND 1 tablet (10 mg total) daily before supper. 06/21/23   Shamleffer, Ibtehal Jaralla, MD  insulin  glargine (LANTUS  SOLOSTAR) 100 UNIT/ML Solostar Pen Inject 40 Units into the skin daily before breakfast. 06/21/23   Shamleffer, Ibtehal Jaralla, MD  Insulin  Pen Needle 32G X 4 MM MISC 1 Device by Does not apply route daily in the afternoon. 06/21/23   Shamleffer, Ibtehal Jaralla, MD  Lancets [facility] DELICA PLUS [place]) MISC Apply topically 3 (three) times daily. 06/14/23   [provider]  losartan  (COZAAR ) 25 MG tablet Take 1 tablet (25 mg total) by mouth daily. 07/16/23   Albert Huff,  Sunit, DO  megestrol (MEGACE) 40 MG tablet Take 1 tablet (40 mg total) by mouth daily. 07/21/23   Auston Blush, MD  metoprolol  tartrate (LOPRESSOR ) 25 MG tablet Take 1 tablet (25 mg total) by mouth 2 (two) times daily. 05/29/23 07/23/23  Setzer, Sandra J, PA-C  mometasone -formoterol  (DULERA ) 100-5 MCG/ACT AERO Inhale 2 puffs into the lungs 2 (two) times daily at 10 AM and 5 PM. 05/29/23   Setzer, Sandra J, PA-C  Multiple Vitamin (MULTIVITAMIN WITH MINERALS) TABS tablet Take 1 tablet by mouth daily. 05/29/23   Setzer, Sandra J, PA-C  ONETOUCH VERIO test strip 3 (three) times  daily. 06/14/23   [provider]  oxyCODONE -acetaminophen  (PERCOCET/ROXICET) 5-325 MG tablet Take 1 tablet by mouth 4 (four) times daily as needed for moderate pain (pain score 4-6) or severe pain (pain score 7-10).    [provider]  pantoprazole  (PROTONIX ) 40 MG tablet Take 1 tablet (40 mg total) by mouth daily. 05/29/23   Setzer, Hamp Levine, PA-C  predniSONE  (DELTASONE ) 10 MG tablet Take 2 tablets (20 mg total) by mouth daily. 07/21/23   Auston Blush, MD  tiZANidine  (ZANAFLEX ) 4 MG tablet Take 4 mg by mouth every 12 (twelve) hours as needed for muscle spasms. 06/10/23   [provider]  Ubrogepant  (UBRELVY ) 100 MG TABS Take 1 tablet (100 mg total) by mouth as needed. Take 1 tablet at onset of headache, may repeat in 2 hours if needed. Max is 200 mg in 24 hours. Patient not taking: Reported on 07/18/2023 01/31/23   Wess Hammed, NP  vitamin C  (ASCORBIC ACID ) 250 MG tablet Take 2 tablets (500 mg total) by mouth daily. Patient taking differently: Take 250 mg by mouth daily. 04/10/22   Timmy Forbes, MD      Allergies    Trulicity  [dulaglutide ]    Review of Systems   Review of Systems  Physical Exam Updated Vital Signs BP 139/76   Pulse 96   Temp 98.3 F (36.8 C) (Oral)   Resp 16   Ht 5\' 9"  (1.753 m)   Wt 101 kg   LMP 07/19/2023 (Exact Date)   SpO2 100%   BMI 32.88 kg/m  Physical Exam Constitutional:      General: She is not in acute distress. HENT:     Head: Normocephalic and atraumatic.  Eyes:     Conjunctiva/sclera: Conjunctivae normal.     Pupils: Pupils are equal, round, and reactive to light.  Cardiovascular:     Rate and Rhythm: Normal rate and regular rhythm.  Pulmonary:     Effort: Pulmonary effort is normal. No respiratory distress.  Abdominal:     General: There is no distension.     Tenderness: There is no abdominal tenderness.  Skin:    General: Skin is warm and dry.  Neurological:     General: No focal deficit present.     Mental Status:  She is alert. Mental status is at baseline.  Psychiatric:        Mood and Affect: Mood normal.        Behavior: Behavior normal.     ED Results / Procedures / Treatments   Labs (all labs ordered are listed, but only abnormal results are displayed) Labs Reviewed  COMPREHENSIVE METABOLIC PANEL WITH GFR - Abnormal; Notable for the following components:      Result Value   Chloride 112 (*)    CO2 19 (*)    Glucose, Bld 184 (*)    BUN 23 (*)  Creatinine, Ser 1.23 (*)    Calcium  8.1 (*)    Albumin 3.3 (*)    Total Bilirubin 1.5 (*)    GFR, Estimated 57 (*)    All other components within normal limits  CBC - Abnormal; Notable for the following components:   RBC 2.52 (*)    Hemoglobin 7.1 (*)    HCT 20.8 (*)    RDW 17.8 (*)    All other components within normal limits  TYPE AND SCREEN  PREPARE RBC (CROSSMATCH)    EKG None  Radiology No results found.  Procedures .Critical Care  Performed by: Arvilla Birmingham, MD Authorized by: Arvilla Birmingham, MD   Critical care provider statement:    Critical care time (minutes):  40   Critical care time was exclusive of:  Separately billable procedures and treating other patients   Critical care was necessary to treat or prevent imminent or life-threatening deterioration of the following conditions:  Circulatory failure   Critical care was time spent personally by me on the following activities:  Ordering and performing treatments and interventions, ordering and review of laboratory studies, ordering and review of radiographic studies, pulse oximetry, review of old charts, examination of patient and evaluation of patient's response to treatment     Medications Ordered in ED Medications  0.9 %  sodium chloride  infusion (Manually program via Guardrails IV Fluids) (has no administration in time range)    ED Course/ Medical Decision Making/ A&P                                 Medical Decision Making Amount and/or Complexity of  Data Reviewed Labs: ordered.  Risk Prescription drug management.   Patient is presented to ED with concern for symptomatic anemia, dizziness, shortness of breath and lightheadedness.  I reviewed her external records including OB/GYN evaluation 2 days ago and her labs from earlier this afternoon.  Hgb 6.0 at [facility] and she had transfusion done, then was 6.9 yesterday, then 6.3 today.  We'll recheck now, but she is consented by myself for another blood transfusion, 2 units of blood.  Labs reviewed - hgb 7.1 CKD at baseline  Pt transfused for anemia - will be stable for discharge afterwards, and can follow up with her OBGYN.  Continue megace prescribed.        Final Clinical Impression(s) / ED Diagnoses Final diagnoses:  Symptomatic anemia    Rx / DC Orders ED Discharge Orders     None         Arael Piccione, Janalyn Me, MD 07/23/23 2350

## 2023-07-23 NOTE — Telephone Encounter (Signed)
 Critical lab received from [facility] in [place] lab.   hemoglobin 6.3  MD notified.

## 2023-07-23 NOTE — ED Provider Triage Note (Signed)
 Emergency Medicine Provider Triage Evaluation Note  Melinda Stone , a 43 y.o. female  was evaluated in triage.  Pt complains of dizziness, weakness, and low hemoglobin (6.3) taken at her regular doctors office  Review of Systems  Positive: As above Negative: As above  Physical Exam  BP (!) 169/85 (BP Location: Left Arm)   Pulse (!) 105   Temp 98.4 F (36.9 C) (Oral)   Resp 20   LMP 07/19/2023 (Exact Date)   SpO2 100%  Gen:   Awake, no distress   Resp:  Normal effort  MSK:   Moves extremities without difficulty    Medical Decision Making  Medically screening exam initiated at 8:12 PM.  Appropriate orders placed.  Kerri L Haywood was informed that the remainder of the evaluation will be completed by another provider, this initial triage assessment does not replace that evaluation, and the importance of remaining in the ED until their evaluation is complete.     Rexie Catena, PA-C 07/23/23 2013

## 2023-07-23 NOTE — Addendum Note (Signed)
 Addended by: Craven Do on: 07/23/2023 04:12 PM   Modules accepted: Orders

## 2023-07-24 ENCOUNTER — Other Ambulatory Visit: Payer: Self-pay

## 2023-07-24 ENCOUNTER — Telehealth: Payer: Self-pay | Admitting: *Deleted

## 2023-07-24 ENCOUNTER — Encounter: Payer: Self-pay | Admitting: Oncology

## 2023-07-24 DIAGNOSIS — D649 Anemia, unspecified: Secondary | ICD-10-CM | POA: Diagnosis not present

## 2023-07-24 DIAGNOSIS — R0602 Shortness of breath: Secondary | ICD-10-CM

## 2023-07-24 DIAGNOSIS — M7989 Other specified soft tissue disorders: Secondary | ICD-10-CM

## 2023-07-24 LAB — BASIC METABOLIC PANEL WITH GFR
BUN/Creatinine Ratio: 21 (ref 9–23)
BUN: 23 mg/dL (ref 6–24)
CO2: 16 mmol/L — ABNORMAL LOW (ref 20–29)
Calcium: 8 mg/dL — ABNORMAL LOW (ref 8.7–10.2)
Chloride: 110 mmol/L — ABNORMAL HIGH (ref 96–106)
Creatinine, Ser: 1.11 mg/dL — ABNORMAL HIGH (ref 0.57–1.00)
Glucose: 239 mg/dL — ABNORMAL HIGH (ref 70–99)
Potassium: 4.8 mmol/L (ref 3.5–5.2)
Sodium: 137 mmol/L (ref 134–144)
eGFR: 64 mL/min/{1.73_m2} (ref >59)

## 2023-07-24 MED ORDER — FUROSEMIDE 20 MG PO TABS: 20.0000 mg | ORAL_TABLET | Freq: Every morning | ORAL | 3 refills | Status: DC

## 2023-07-24 MED ORDER — LOSARTAN POTASSIUM 25 MG PO TABS
25.0000 mg | ORAL_TABLET | Freq: Once | ORAL | Status: AC
  Administered 2023-07-24: 25 mg via ORAL
  Filled 2023-07-24: qty 1

## 2023-07-24 MED ORDER — CYCLOBENZAPRINE HCL 10 MG PO TABS
5.0000 mg | ORAL_TABLET | Freq: Once | ORAL | Status: AC
  Administered 2023-07-24: 5 mg via ORAL
  Filled 2023-07-24: qty 1

## 2023-07-24 NOTE — Telephone Encounter (Signed)
 The soonest bx appt is 5/20. Pt has accepted the appt for 5/20 @ 9:30a, arrive 8:30.  If procedure can be done sooner then we will contact pt with new appt details.

## 2023-07-24 NOTE — Telephone Encounter (Signed)
 Nellie Banas let me know that Dr. Wilhelmenia Harada did not know what is causing the dizziness specially having 2 units of blood yesterday so if she continues to have the dizziness she should go to the ER.  Patient said she would do that,  also Nellie Banas came in while I was telling the patient about this and she wanted to let her know that there is a bone marrow biopsy on 5/20 and Nellie Banas went over all the instructions today.

## 2023-07-24 NOTE — Telephone Encounter (Signed)
 The patient said that she got 2 units of blood yesterday and today she is dizzy.  She states that she gets dizzy just being in the bed and when she has to get up she also has dizziness to.  The patient says been drinking water  and apple juice she had grits and eggs for breakfast.  She wants to know what she should do to feel better

## 2023-07-25 LAB — TYPE AND SCREEN
ABO/RH(D): A POS
Antibody Screen: NEGATIVE
Unit division: 0
Unit division: 0

## 2023-07-25 LAB — COMP PANEL: LEUKEMIA/LYMPHOMA

## 2023-07-25 LAB — BPAM RBC
Blood Product Expiration Date: 202506042359
Blood Product Expiration Date: 202506042359
ISSUE DATE / TIME: 202505062226
ISSUE DATE / TIME: 202505070120
Unit Type and Rh: 202506042359
Unit Type and Rh: 6200
Unit Type and Rh: 6200

## 2023-07-26 LAB — BCR-ABL1 FISH
Cells Analyzed: 200
Cells Counted: 200

## 2023-07-27 ENCOUNTER — Other Ambulatory Visit: Payer: Self-pay

## 2023-07-27 ENCOUNTER — Encounter (HOSPITAL_COMMUNITY): Payer: Self-pay | Admitting: *Deleted

## 2023-07-27 ENCOUNTER — Emergency Department (HOSPITAL_COMMUNITY)
Admission: EM | Admit: 2023-07-27 | Discharge: 2023-07-27 | Disposition: A | Discharge status: Home / Self Care | Attending: Emergency Medicine | Admitting: Emergency Medicine

## 2023-07-27 DIAGNOSIS — D649 Anemia, unspecified: Secondary | ICD-10-CM | POA: Insufficient documentation

## 2023-07-27 DIAGNOSIS — R42 Dizziness and giddiness: Secondary | ICD-10-CM | POA: Insufficient documentation

## 2023-07-27 DIAGNOSIS — Z794 Long term (current) use of insulin: Secondary | ICD-10-CM | POA: Insufficient documentation

## 2023-07-27 DIAGNOSIS — Z7984 Long term (current) use of oral hypoglycemic drugs: Secondary | ICD-10-CM | POA: Insufficient documentation

## 2023-07-27 DIAGNOSIS — N939 Abnormal uterine and vaginal bleeding, unspecified: Secondary | ICD-10-CM | POA: Diagnosis not present

## 2023-07-27 DIAGNOSIS — R531 Weakness: Secondary | ICD-10-CM | POA: Diagnosis present

## 2023-07-27 LAB — CBC
HCT: 23.6 % — ABNORMAL LOW (ref 36.0–46.0)
Hemoglobin: 8 g/dL — ABNORMAL LOW (ref 12.0–15.0)
MCH: 28.4 pg (ref 26.0–34.0)
MCHC: 33.9 g/dL (ref 30.0–36.0)
MCV: 83.7 fL (ref 80.0–100.0)
Platelets: 173 10*3/uL (ref 150–400)
RBC: 2.82 MIL/uL — ABNORMAL LOW (ref 3.87–5.11)
RDW: 17.1 % — ABNORMAL HIGH (ref 11.5–15.5)
WBC: 8.7 10*3/uL (ref 4.0–10.5)
nRBC: 0 % (ref 0.0–0.2)

## 2023-07-27 LAB — COMPREHENSIVE METABOLIC PANEL WITH GFR
ALT: 12 U/L (ref 0–44)
AST: 19 U/L (ref 15–41)
Albumin: 2.7 g/dL — ABNORMAL LOW (ref 3.5–5.0)
Alkaline Phosphatase: 82 U/L (ref 38–126)
Anion gap: 5 (ref 5–15)
BUN: 19 mg/dL (ref 6–20)
CO2: 19 mmol/L — ABNORMAL LOW (ref 22–32)
Calcium: 8.2 mg/dL — ABNORMAL LOW (ref 8.9–10.3)
Chloride: 115 mmol/L — ABNORMAL HIGH (ref 98–111)
Creatinine, Ser: 1.06 mg/dL — ABNORMAL HIGH (ref 0.44–1.00)
GFR, Estimated: >60 mL/min (ref >60)
Glucose, Bld: 216 mg/dL — ABNORMAL HIGH (ref 70–99)
Potassium: 4.7 mmol/L (ref 3.5–5.1)
Sodium: 139 mmol/L (ref 135–145)
Total Bilirubin: 1 mg/dL (ref 0.0–1.2)
Total Protein: 5.5 g/dL — ABNORMAL LOW (ref 6.5–8.1)

## 2023-07-27 LAB — LIPASE, BLOOD: Lipase: 40 U/L (ref 11–51)

## 2023-07-27 LAB — HCG, SERUM, QUALITATIVE: Preg, Serum: NEGATIVE

## 2023-07-27 MED ORDER — MEGESTROL ACETATE 40 MG PO TABS: ORAL_TABLET | ORAL | 0 refills | Status: DC

## 2023-07-27 NOTE — ED Triage Notes (Signed)
 The pt has been dizz and weakness since  Thursday  she reports that she is anemic and she think she needs blood  no bowel changes  lmp now heavy period

## 2023-07-27 NOTE — ED Provider Notes (Signed)
 [place] EMERGENCY DEPARTMENT AT [facility] Provider Note   CSN: [id number] Arrival date & time: 07/27/23  1730     History {Add pertinent medical, surgical, social history, OB history to HPI:1} Chief Complaint  Patient presents with   Weakness   Dizziness    Melinda Stone is a 43 y.o. female.  43 year old female with a history of uterine fibroids and heavy vaginal bleeding resents emergency department with generalized weakness and vaginal bleeding.  Patient reports that her last period was on the second of this month.  Was seen in the emergency department on the fourth and the sixth and required transfusions of blood.  Says that today she went through 3 depends.  Feeling more weak than usual.  Was sent home on Megace  last month.  Says that she only takes it once a day now.  No history of DVT or PE.  Does not smoke.  No personal history of cancer.  Is already taking p.o. iron .  Not on blood thinners.  Has an appointment with her OB/GYN on Tuesday.       Home Medications Prior to Admission medications   Medication Sig Start Date End Date Taking? Authorizing Provider  acetaminophen  (TYLENOL ) 500 MG tablet Take 500 mg by mouth every 6 (six) hours as needed for mild pain (pain score 1-3) or moderate pain (pain score 4-6).    [provider]  albuterol  (VENTOLIN  HFA) 108 (90 Base) MCG/ACT inhaler Inhale 2 puffs into the lungs every 6 (six) hours as needed for wheezing or shortness of breath. 05/29/23   Setzer, Sandra J, PA-C  amitriptyline  (ELAVIL ) 25 MG tablet Take 1 tablet (25 mg total) by mouth at bedtime. 01/31/23   Wess Hammed, NP  Blood Glucose Monitoring Suppl (ONETOUCH VERIO FLEX SYSTEM) w/Device KIT 3 (three) times daily. 06/14/23   [provider]  Continuous Glucose Sensor (DEXCOM G7 SENSOR) MISC 1 Device by Does not apply route as directed. Change sensor every 10 days 06/21/23   Shamleffer, Ibtehal Jaralla, MD  Cyanocobalamin  (VITAMIN  B-12) 2500 MCG SUBL Place 1 tablet (2,500 mcg total) under the tongue daily. 04/23/23   Timmy Forbes, MD  cyclobenzaprine  (FLEXERIL ) 5 MG tablet Take 1 tablet (5 mg total) by mouth 3 (three) times daily as needed for muscle spasms. Patient not taking: Reported on 07/18/2023 05/29/23   Setzer, Sandra J, PA-C  diclofenac  Sodium (VOLTAREN ) 1 % GEL Apply 2 g topically 3 (three) times daily. Patient not taking: Reported on 07/18/2023 05/29/23   Setzer, Sandra J, PA-C  doxycycline  (VIBRAMYCIN ) 100 MG capsule Take 1 capsule (100 mg total) by mouth 2 (two) times daily. 07/21/23   Auston Blush, MD  ferrous sulfate  325 (65 FE) MG tablet Take 1 tablet (325 mg total) by mouth daily with breakfast. 05/29/23   Setzer, Sandra J, PA-C  furosemide  (LASIX ) 20 MG tablet Take 1 tablet (20 mg total) by mouth in the morning. 07/24/23   Tolia, Sunit, DO  gabapentin  (NEURONTIN ) 800 MG tablet Take 800 mg by mouth 3 (three) times daily.    [provider]  glipiZIDE  (GLUCOTROL ) 10 MG tablet Take 2 tablets (20 mg total) by mouth daily before breakfast AND 1 tablet (10 mg total) daily before supper. 06/21/23   Shamleffer, Ibtehal Jaralla, MD  insulin  glargine (LANTUS  SOLOSTAR) 100 UNIT/ML Solostar Pen Inject 40 Units into the skin daily before breakfast. 06/21/23   Shamleffer, Ibtehal Jaralla, MD  Insulin  Pen Needle 32G X 4 MM MISC 1 Device  by Does not apply route daily in the afternoon. 06/21/23   Shamleffer, Ibtehal Jaralla, MD  Lancets [facility] DELICA PLUS [place]) MISC Apply topically 3 (three) times daily. 06/14/23   [provider]  losartan  (COZAAR ) 25 MG tablet Take 1 tablet (25 mg total) by mouth daily. 07/16/23   Tolia, Sunit, DO  megestrol  (MEGACE ) 40 MG tablet Take 1 tablet (40 mg total) by mouth daily. 07/21/23   Auston Blush, MD  metoprolol  tartrate (LOPRESSOR ) 25 MG tablet Take 1 tablet (25 mg total) by mouth 2 (two) times daily. 05/29/23 07/23/23  Setzer, Sandra J, PA-C  mometasone -formoterol  (DULERA ) 100-5 MCG/ACT  AERO Inhale 2 puffs into the lungs 2 (two) times daily at 10 AM and 5 PM. 05/29/23   Setzer, Sandra J, PA-C  Multiple Vitamin (MULTIVITAMIN WITH MINERALS) TABS tablet Take 1 tablet by mouth daily. 05/29/23   Setzer, Sandra J, PA-C  ONETOUCH VERIO test strip 3 (three) times daily. 06/14/23   [provider]  oxyCODONE -acetaminophen  (PERCOCET/ROXICET) 5-325 MG tablet Take 1 tablet by mouth 4 (four) times daily as needed for moderate pain (pain score 4-6) or severe pain (pain score 7-10).    [provider]  pantoprazole  (PROTONIX ) 40 MG tablet Take 1 tablet (40 mg total) by mouth daily. 05/29/23   Setzer, Sandra J, PA-C  predniSONE  (DELTASONE ) 10 MG tablet Take 2 tablets (20 mg total) by mouth daily. 07/21/23   Auston Blush, MD  tiZANidine  (ZANAFLEX ) 4 MG tablet Take 4 mg by mouth every 12 (twelve) hours as needed for muscle spasms. 06/10/23   [provider]  Ubrogepant  (UBRELVY ) 100 MG TABS Take 1 tablet (100 mg total) by mouth as needed. Take 1 tablet at onset of headache, may repeat in 2 hours if needed. Max is 200 mg in 24 hours. Patient not taking: Reported on 07/18/2023 01/31/23   Wess Hammed, NP  vitamin C  (ASCORBIC ACID ) 250 MG tablet Take 2 tablets (500 mg total) by mouth daily. Patient taking differently: Take 250 mg by mouth daily. 04/10/22   Timmy Forbes, MD      Allergies    Trulicity  [dulaglutide ]    Review of Systems   Review of Systems  Physical Exam Updated Vital Signs BP 104/60 (BP Location: Left Arm)   Pulse 94   Temp 98.4 F (36.9 C) (Oral)   Resp 18   Ht 5\' 9"  (1.753 m)   Wt 101 kg   LMP 07/19/2023 (Exact Date)   SpO2 100%   BMI 32.88 kg/m  Physical Exam Vitals and nursing note reviewed.  Constitutional:      General: She is not in acute distress.    Appearance: She is well-developed.  HENT:     Head: Normocephalic and atraumatic.     Right Ear: External ear normal.     Left Ear: External ear normal.     Nose: Nose normal.  Eyes:      Extraocular Movements: Extraocular movements intact.     Conjunctiva/sclera: Conjunctivae normal.     Pupils: Pupils are equal, round, and reactive to light.  Pulmonary:     Effort: Pulmonary effort is normal. No respiratory distress.  Abdominal:     General: Abdomen is flat. There is no distension.     Palpations: Abdomen is soft. There is no mass.     Tenderness: There is no abdominal tenderness. There is no guarding.  Musculoskeletal:     Cervical back: Normal range of motion and neck supple.  Skin:  General: Skin is warm and dry.  Neurological:     Mental Status: She is alert.  Psychiatric:        Mood and Affect: Mood normal.     ED Results / Procedures / Treatments   Labs (all labs ordered are listed, but only abnormal results are displayed) Labs Reviewed  CBC - Abnormal; Notable for the following components:      Result Value   RBC 2.82 (*)    Hemoglobin 8.0 (*)    HCT 23.6 (*)    RDW 17.1 (*)    All other components within normal limits  LIPASE, BLOOD  COMPREHENSIVE METABOLIC PANEL WITH GFR  HCG, SERUM, QUALITATIVE    EKG None  Radiology No results found.  Procedures Procedures  {Document cardiac monitor, telemetry assessment procedure when appropriate:1}  Medications Ordered in ED Medications - No data to display  ED Course/ Medical Decision Making/ A&P   {   Click here for ABCD2, HEART and other calculatorsREFRESH Note before signing :1}                              Medical Decision Making Amount and/or Complexity of Data Reviewed Labs: ordered.   ***  {Document critical care time when appropriate:1} {Document review of labs and clinical decision tools ie heart score, Chads2Vasc2 etc:1}  {Document your independent review of radiology images, and any outside records:1} {Document your discussion with family members, caretakers, and with consultants:1} {Document social determinants of health affecting pt's care:1} {Document your decision  making why or why not admission, treatments were needed:1} Final Clinical Impression(s) / ED Diagnoses Final diagnoses:  None    Rx / DC Orders ED Discharge Orders     None

## 2023-07-27 NOTE — Discharge Instructions (Signed)
 You are seen for vaginal bleeding and weakness.  Is likely that your symptoms are from anemia.  Please follow-up with your OB/GYN.  Take the Megace  twice a day until the bleeding slows down significantly.  Return to the emergency department if you go through 2 pads an hour for 2 hours or more, faint, or have any other concerning symptoms.

## 2023-07-29 ENCOUNTER — Telehealth: Payer: Self-pay

## 2023-07-29 DIAGNOSIS — D519 Vitamin B12 deficiency anemia, unspecified: Secondary | ICD-10-CM

## 2023-07-29 NOTE — Telephone Encounter (Signed)
 [facility] schedule on Wed (07/31/23) @ 9am for labs (cbc, hold tube). Pt aware of appt details.   Informed pt that she will wait in the lobby after labs and if blood is needed, we will check with SDS to see if blood can be done same day. Pt agreed on plan

## 2023-07-30 ENCOUNTER — Ambulatory Visit: Admitting: Obstetrics & Gynecology

## 2023-07-30 VITALS — BP 120/73 | HR 91 | Wt 214.0 lb

## 2023-07-30 DIAGNOSIS — N920 Excessive and frequent menstruation with regular cycle: Secondary | ICD-10-CM

## 2023-07-30 DIAGNOSIS — R102 Pelvic and perineal pain: Secondary | ICD-10-CM | POA: Diagnosis not present

## 2023-07-30 LAB — PNH PROFILE (-HIGH SENSITIVITY)

## 2023-07-30 MED ORDER — MEGESTROL ACETATE 40 MG PO TABS: ORAL_TABLET | ORAL | 3 refills | Status: DC

## 2023-07-30 NOTE — Progress Notes (Signed)
 Pt is in office for follow up.  Pt states that the bleeding has been longer, last was 12 days with large clots.  Pt has been seen in the hospital several times since last visit. Pt states she had blood transfusion at 5/6 visit.  Hgb was 8 on last CBC.

## 2023-07-30 NOTE — Progress Notes (Signed)
 Patient ID: Melinda Stone, female   DOB: 04-27-1980, 43 y.o.   MRN: [id number]  Chief Complaint  Patient presents with   Follow-up    HPI Melinda Stone is a 43 y.o. female.  G6P0 Patient's last menstrual period was 07/19/2023 (exact date). She comes for f/u after ED visit for anemia and she received 1 unit PRBC. She is taking Megace  and she had one dose of premarin  in the ED. Bleeding is better now and she want to discuss long-term management. She would consider IUD or ablation. HPI  Past Medical History:  Diagnosis Date   Anemia    Arthritis 08/17/2020   Chronic back pain    Diabetes mellitus without complication (HCC)    type 2   GERD (gastroesophageal reflux disease)    Migraine    Ovarian cyst    Pneumonia    Polyneuropathy     Past Surgical History:  Procedure Laterality Date   CESAREAN SECTION     CHOLECYSTECTOMY  2004   EYE SURGERY Left    cataract   LUMBAR LAMINECTOMY N/A 05/30/2020   Procedure: Lumbar five Laminectomy,  Bilateral Microdiscectomy, Left Lumbar five -Sacral one Microdiscectomy;  Surgeon: Adah Acron, MD;  Location: [facility] OR;  Service: Orthopedics;  Laterality: N/A;   LUMBAR LAMINECTOMY Left 11/14/2020   Procedure: LEFT LUMBAR FOUR-FIVE MICRODISCECTOMY;  Surgeon: Adah Acron, MD;  Location: [facility] OR;  Service: Orthopedics;  Laterality: Left;   TUBAL LIGATION  10/19/2010    Family History  Problem Relation Age of Onset   Diabetes Mother    Hypertension Mother    Stroke Mother     Social History Social History   Tobacco Use   Smoking status: Former    Types: Cigars    Quit date: 12/23/2021    Years since quitting: 1.6   Smokeless tobacco: Never   Tobacco comments:    2 cigars daily. Khj 02/12/2023  Vaping Use   Vaping status: Never Used  Substance Use Topics   Alcohol  use: Not Currently   Drug use: Not Currently    Comment: per patient stopped smoking marijuana Mar 2022    Allergies  Allergen Reactions   Trulicity   [Dulaglutide ] Diarrhea    Current Outpatient Medications  Medication Sig Dispense Refill   acetaminophen  (TYLENOL ) 500 MG tablet Take 500 mg by mouth every 6 (six) hours as needed for mild pain (pain score 1-3) or moderate pain (pain score 4-6).     albuterol  (VENTOLIN  HFA) 108 (90 Base) MCG/ACT inhaler Inhale 2 puffs into the lungs every 6 (six) hours as needed for wheezing or shortness of breath. 18 g 2   amitriptyline  (ELAVIL ) 25 MG tablet Take 1 tablet (25 mg total) by mouth at bedtime. 30 tablet 6   Blood Glucose Monitoring Suppl (ONETOUCH VERIO FLEX SYSTEM) w/Device KIT 3 (three) times daily.     Continuous Glucose Sensor (DEXCOM G7 SENSOR) MISC 1 Device by Does not apply route as directed. Change sensor every 10 days 9 each 3   Cyanocobalamin  (VITAMIN B-12) 2500 MCG SUBL Place 1 tablet (2,500 mcg total) under the tongue daily. 30 tablet 2   cyclobenzaprine  (FLEXERIL ) 5 MG tablet Take 1 tablet (5 mg total) by mouth 3 (three) times daily as needed for muscle spasms. (Patient not taking: Reported on 07/18/2023) 30 tablet 0   diclofenac  Sodium (VOLTAREN ) 1 % GEL Apply 2 g topically 3 (three) times daily. (Patient not taking: Reported on 07/18/2023)     doxycycline  (VIBRAMYCIN )  100 MG capsule Take 1 capsule (100 mg total) by mouth 2 (two) times daily. 20 capsule 0   ferrous sulfate  325 (65 FE) MG tablet Take 1 tablet (325 mg total) by mouth daily with breakfast. 30 tablet 0   furosemide  (LASIX ) 20 MG tablet Take 1 tablet (20 mg total) by mouth in the morning. 30 tablet 3   gabapentin  (NEURONTIN ) 800 MG tablet Take 800 mg by mouth 3 (three) times daily.     glipiZIDE  (GLUCOTROL ) 10 MG tablet Take 2 tablets (20 mg total) by mouth daily before breakfast AND 1 tablet (10 mg total) daily before supper. 270 tablet 3   insulin  glargine (LANTUS  SOLOSTAR) 100 UNIT/ML Solostar Pen Inject 40 Units into the skin daily before breakfast. 45 mL 2   Insulin  Pen Needle 32G X 4 MM MISC 1 Device by Does not apply  route daily in the afternoon. 100 each 3   Lancets (ONETOUCH DELICA PLUS LANCET33G) MISC Apply topically 3 (three) times daily.     losartan  (COZAAR ) 25 MG tablet Take 1 tablet (25 mg total) by mouth daily. 30 tablet 3   megestrol  (MEGACE ) 40 MG tablet Take one by mouth in the morning and two at night 90 tablet 3   metoprolol  tartrate (LOPRESSOR ) 25 MG tablet Take 1 tablet (25 mg total) by mouth 2 (two) times daily. 60 tablet 0   mometasone -formoterol  (DULERA ) 100-5 MCG/ACT AERO Inhale 2 puffs into the lungs 2 (two) times daily at 10 AM and 5 PM. 13 g 0   Multiple Vitamin (MULTIVITAMIN WITH MINERALS) TABS tablet Take 1 tablet by mouth daily.     ONETOUCH VERIO test strip 3 (three) times daily.     oxyCODONE -acetaminophen  (PERCOCET/ROXICET) 5-325 MG tablet Take 1 tablet by mouth 4 (four) times daily as needed for moderate pain (pain score 4-6) or severe pain (pain score 7-10).     pantoprazole  (PROTONIX ) 40 MG tablet Take 1 tablet (40 mg total) by mouth daily. 30 tablet 0   predniSONE  (DELTASONE ) 10 MG tablet Take 2 tablets (20 mg total) by mouth daily. 10 tablet 0   tiZANidine  (ZANAFLEX ) 4 MG tablet Take 4 mg by mouth every 12 (twelve) hours as needed for muscle spasms.     Ubrogepant  (UBRELVY ) 100 MG TABS Take 1 tablet (100 mg total) by mouth as needed. Take 1 tablet at onset of headache, may repeat in 2 hours if needed. Max is 200 mg in 24 hours. (Patient not taking: Reported on 07/18/2023) 12 tablet 11   vitamin C  (ASCORBIC ACID ) 250 MG tablet Take 2 tablets (500 mg total) by mouth daily. (Patient taking differently: Take 250 mg by mouth daily.) 180 tablet 0   No current facility-administered medications for this visit.    Review of Systems Review of Systems  Constitutional: Negative.   Respiratory: Negative.    Cardiovascular: Negative.   Gastrointestinal: Negative.   Genitourinary:  Menstrual problem: cramps.    Blood pressure 120/73, pulse 91, weight 214 lb (97.1 kg), last menstrual  period 07/19/2023.  Physical Exam Physical Exam Vitals and nursing note reviewed.  Constitutional:      Appearance: She is obese.  HENT:     Head: Normocephalic and atraumatic.  Cardiovascular:     Rate and Rhythm: Normal rate.  Pulmonary:     Effort: Pulmonary effort is normal.  Skin:    Coloration: Skin is not pale.  Neurological:     Mental Status: She is alert.  Psychiatric:  Mood and Affect: Mood normal.        Behavior: Behavior normal.     Data Reviewed  Narrative & Impression  CLINICAL DATA:  [id number] Abdominal pain [id number] Endometrial mass [id number]. Last menstrual period 04/25/2023   EXAM: TRANSABDOMINAL AND TRANSVAGINAL ULTRASOUND OF PELVIS   TECHNIQUE: Both transabdominal and transvaginal ultrasound examinations of the pelvis were performed. Transabdominal technique was performed for global imaging of the pelvis including uterus, ovaries, adnexal regions, and pelvic cul-de-sac. It was necessary to proceed with endovaginal exam following the transabdominal exam to visualize the endometrium and bilateral ovaries.   COMPARISON:  CT abdomen pelvis 02/09/2023   FINDINGS: Uterus   Measurements: 9.3 x 5.9 x 7.5 cm = volume: 215.2 mL. 3.8 x 3.1 x 3 cm heterogeneous posterior right intramural mass likely uterine fibroid.   Endometrium   Thickness: 9 mm.  No focal abnormality visualized.   Right ovary   Measurements: 3.2 x 2 x 2.1 cm = volume: 7.1 mL. The right ovary is unremarkable. There is a tubular like anechoic structure within the right adnexa.   Left ovary   Measurements: 4.1 x 1.8 x 3.7 cm = volume: 14.1 mL. Normal appearance/no adnexal mass.   Other findings   Small volume simple free fluid within the pelvis.   IMPRESSION: 1. Suggestion of right hydrosalpinx. 2. Trace simple free fluid within the pelvis. 3. Intramural uterine fibroid.     Electronically Signed   By: Morgane  Naveau M.D.   On: 05/11/2023 13:51  CBC     Component Value Date/Time   WBC 8.7 07/27/2023 1752   RBC 2.82 (L) 07/27/2023 1752   HGB 8.0 (L) 07/27/2023 1752   HGB 6.3 (LL) 07/23/2023 1430   HGB 6.3 (LL) 07/23/2023 1430   HGB 11.5 05/30/2022 1022   HCT 23.6 (L) 07/27/2023 1752   HCT 36.2 05/30/2022 1022   PLT 173 07/27/2023 1752   PLT 160 07/23/2023 1430   PLT 189 05/30/2022 1022   MCV 83.7 07/27/2023 1752   MCV 83 05/30/2022 1022   MCH 28.4 07/27/2023 1752   MCHC 33.9 07/27/2023 1752   RDW 17.1 (H) 07/27/2023 1752   RDW 18.5 (H) 05/30/2022 1022   LYMPHSABS 1.5 07/23/2023 1430   LYMPHSABS 2.0 10/20/2018 1002   MONOABS 0.4 07/23/2023 1430   EOSABS 0.0 07/23/2023 1430   EOSABS 0.1 10/20/2018 1002   BASOSABS 0.1 07/23/2023 1430   BASOSABS 0.1 10/20/2018 1002    Assessment Menorrhagia with regular cycle - Plan: megestrol  (MEGACE ) 40 MG tablet  Pelvic pain in female - Plan: megestrol  (MEGACE ) 40 MG tablet   Plan Continue megace  for now and increase dose until next visit  RTC for IUD or endometrial biopsy if she wants endometrial ablation    Onnie Bilis 07/30/2023, 9:15 AM

## 2023-07-31 ENCOUNTER — Encounter (HOSPITAL_COMMUNITY): Payer: Self-pay

## 2023-07-31 ENCOUNTER — Inpatient Hospital Stay

## 2023-07-31 ENCOUNTER — Other Ambulatory Visit: Payer: Self-pay

## 2023-07-31 ENCOUNTER — Telehealth: Payer: Self-pay

## 2023-07-31 ENCOUNTER — Other Ambulatory Visit: Payer: Self-pay | Admitting: Oncology

## 2023-07-31 ENCOUNTER — Ambulatory Visit: Payer: Self-pay

## 2023-07-31 ENCOUNTER — Inpatient Hospital Stay (HOSPITAL_COMMUNITY)
Admission: EM | Admit: 2023-07-31 | Discharge: 2023-08-02 | DRG: 812 | Disposition: A | Discharge status: Home / Self Care | Attending: Internal Medicine | Admitting: Internal Medicine

## 2023-07-31 ENCOUNTER — Encounter: Payer: Self-pay | Admitting: Oncology

## 2023-07-31 DIAGNOSIS — D649 Anemia, unspecified: Principal | ICD-10-CM | POA: Diagnosis present

## 2023-07-31 DIAGNOSIS — M5127 Other intervertebral disc displacement, lumbosacral region: Secondary | ICD-10-CM

## 2023-07-31 DIAGNOSIS — F17290 Nicotine dependence, other tobacco product, uncomplicated: Secondary | ICD-10-CM | POA: Diagnosis present

## 2023-07-31 DIAGNOSIS — D500 Iron deficiency anemia secondary to blood loss (chronic): Principal | ICD-10-CM | POA: Diagnosis present

## 2023-07-31 DIAGNOSIS — N920 Excessive and frequent menstruation with regular cycle: Secondary | ICD-10-CM

## 2023-07-31 DIAGNOSIS — D638 Anemia in other chronic diseases classified elsewhere: Secondary | ICD-10-CM | POA: Diagnosis present

## 2023-07-31 DIAGNOSIS — E119 Type 2 diabetes mellitus without complications: Secondary | ICD-10-CM

## 2023-07-31 DIAGNOSIS — F419 Anxiety disorder, unspecified: Secondary | ICD-10-CM

## 2023-07-31 DIAGNOSIS — Z7984 Long term (current) use of oral hypoglycemic drugs: Secondary | ICD-10-CM

## 2023-07-31 DIAGNOSIS — D696 Thrombocytopenia, unspecified: Secondary | ICD-10-CM | POA: Diagnosis present

## 2023-07-31 DIAGNOSIS — Z7951 Long term (current) use of inhaled steroids: Secondary | ICD-10-CM

## 2023-07-31 DIAGNOSIS — I10 Essential (primary) hypertension: Secondary | ICD-10-CM | POA: Diagnosis present

## 2023-07-31 DIAGNOSIS — K219 Gastro-esophageal reflux disease without esophagitis: Secondary | ICD-10-CM | POA: Diagnosis present

## 2023-07-31 DIAGNOSIS — Z888 Allergy status to other drugs, medicaments and biological substances status: Secondary | ICD-10-CM

## 2023-07-31 DIAGNOSIS — D519 Vitamin B12 deficiency anemia, unspecified: Secondary | ICD-10-CM

## 2023-07-31 DIAGNOSIS — E872 Acidosis, unspecified: Secondary | ICD-10-CM

## 2023-07-31 DIAGNOSIS — Z79899 Other long term (current) drug therapy: Secondary | ICD-10-CM

## 2023-07-31 DIAGNOSIS — Z833 Family history of diabetes mellitus: Secondary | ICD-10-CM

## 2023-07-31 DIAGNOSIS — A749 Chlamydial infection, unspecified: Secondary | ICD-10-CM

## 2023-07-31 DIAGNOSIS — Z8249 Family history of ischemic heart disease and other diseases of the circulatory system: Secondary | ICD-10-CM

## 2023-07-31 DIAGNOSIS — Z794 Long term (current) use of insulin: Secondary | ICD-10-CM

## 2023-07-31 DIAGNOSIS — Z823 Family history of stroke: Secondary | ICD-10-CM

## 2023-07-31 DIAGNOSIS — G8929 Other chronic pain: Secondary | ICD-10-CM | POA: Diagnosis present

## 2023-07-31 DIAGNOSIS — E1142 Type 2 diabetes mellitus with diabetic polyneuropathy: Secondary | ICD-10-CM | POA: Diagnosis present

## 2023-07-31 LAB — CBC
HCT: 22.3 % — ABNORMAL LOW (ref 36.0–46.0)
HCT: 25.1 % — ABNORMAL LOW (ref 36.0–46.0)
Hemoglobin: 7.5 g/dL — ABNORMAL LOW (ref 12.0–15.0)
Hemoglobin: 8.1 g/dL — ABNORMAL LOW (ref 12.0–15.0)
MCH: 28.2 pg (ref 26.0–34.0)
MCH: 27.8 pg (ref 26.0–34.0)
MCHC: 33.6 g/dL (ref 30.0–36.0)
MCHC: 32.3 g/dL (ref 30.0–36.0)
MCV: 83.8 fL (ref 80.0–100.0)
MCV: 86.3 fL (ref 80.0–100.0)
Platelets: 167 10*3/uL (ref 150–400)
Platelets: 29 10*3/uL — CL (ref 150–400)
RBC: 2.66 MIL/uL — ABNORMAL LOW (ref 3.87–5.11)
RBC: 2.91 MIL/uL — ABNORMAL LOW (ref 3.87–5.11)
RDW: 16.2 % — ABNORMAL HIGH (ref 11.5–15.5)
RDW: 15.7 % — ABNORMAL HIGH (ref 11.5–15.5)
WBC: 8.4 10*3/uL (ref 4.0–10.5)
WBC: 6.1 10*3/uL (ref 4.0–10.5)
nRBC: 0 % (ref 0.0–0.2)
nRBC: 0 % (ref 0.0–0.2)

## 2023-07-31 LAB — JAK2 V617F RFX CALR/MPL/E12-15

## 2023-07-31 LAB — CBC WITH DIFFERENTIAL/PLATELET
Abs Immature Granulocytes: 0.04 10*3/uL (ref 0.00–0.07)
Basophils Absolute: 0.1 10*3/uL (ref 0.0–0.1)
Basophils Relative: 1 %
Eosinophils Absolute: 0.2 10*3/uL (ref 0.0–0.5)
Eosinophils Relative: 2 %
HCT: 25.3 % — ABNORMAL LOW (ref 36.0–46.0)
Hemoglobin: 8.3 g/dL — ABNORMAL LOW (ref 12.0–15.0)
Immature Granulocytes: 1 %
Lymphocytes Relative: 27 %
Lymphs Abs: 2.3 10*3/uL (ref 0.7–4.0)
MCH: 27.9 pg (ref 26.0–34.0)
MCHC: 32.8 g/dL (ref 30.0–36.0)
MCV: 85.2 fL (ref 80.0–100.0)
Monocytes Absolute: 0.4 10*3/uL (ref 0.1–1.0)
Monocytes Relative: 5 %
Neutro Abs: 5.4 10*3/uL (ref 1.7–7.7)
Neutrophils Relative %: 64 %
Platelets: 5 10*3/uL — CL (ref 150–400)
RBC: 2.97 MIL/uL — ABNORMAL LOW (ref 3.87–5.11)
RDW: 15.9 % — ABNORMAL HIGH (ref 11.5–15.5)
WBC: 8.3 10*3/uL (ref 4.0–10.5)
nRBC: 0 % (ref 0.0–0.2)

## 2023-07-31 LAB — CBC WITH DIFFERENTIAL (CANCER CENTER ONLY)
Abs Immature Granulocytes: 0.03 10*3/uL (ref 0.00–0.07)
Basophils Absolute: 0.1 10*3/uL (ref 0.0–0.1)
Basophils Relative: 1 %
Eosinophils Absolute: 0.2 10*3/uL (ref 0.0–0.5)
Eosinophils Relative: 3 %
HCT: 19.9 % — ABNORMAL LOW (ref 36.0–46.0)
Hemoglobin: 6.8 g/dL — CL (ref 12.0–15.0)
Immature Granulocytes: 0 %
Lymphocytes Relative: 20 %
Lymphs Abs: 1.4 10*3/uL (ref 0.7–4.0)
MCH: 27.8 pg (ref 26.0–34.0)
MCHC: 34.2 g/dL (ref 30.0–36.0)
MCV: 81.2 fL (ref 80.0–100.0)
Monocytes Absolute: 0.4 10*3/uL (ref 0.1–1.0)
Monocytes Relative: 5 %
Neutro Abs: 5 10*3/uL (ref 1.7–7.7)
Neutrophils Relative %: 71 %
Platelet Count: 129 10*3/uL — ABNORMAL LOW (ref 150–400)
RBC: 2.45 MIL/uL — ABNORMAL LOW (ref 3.87–5.11)
RDW: 16 % — ABNORMAL HIGH (ref 11.5–15.5)
WBC Count: 7 10*3/uL (ref 4.0–10.5)
nRBC: 0 % (ref 0.0–0.2)

## 2023-07-31 LAB — COMPREHENSIVE METABOLIC PANEL WITH GFR
ALT: 12 U/L (ref 0–44)
AST: 14 U/L — ABNORMAL LOW (ref 15–41)
Albumin: 3.1 g/dL — ABNORMAL LOW (ref 3.5–5.0)
Alkaline Phosphatase: 106 U/L (ref 38–126)
Anion gap: 3 — ABNORMAL LOW (ref 5–15)
BUN: 20 mg/dL (ref 6–20)
CO2: 20 mmol/L — ABNORMAL LOW (ref 22–32)
Calcium: 8.4 mg/dL — ABNORMAL LOW (ref 8.9–10.3)
Chloride: 113 mmol/L — ABNORMAL HIGH (ref 98–111)
Creatinine, Ser: 0.9 mg/dL (ref 0.44–1.00)
GFR, Estimated: >60 mL/min (ref >60)
Glucose, Bld: 170 mg/dL — ABNORMAL HIGH (ref 70–99)
Potassium: 4.8 mmol/L (ref 3.5–5.1)
Sodium: 136 mmol/L (ref 135–145)
Total Bilirubin: 1 mg/dL (ref 0.0–1.2)
Total Protein: 6.3 g/dL — ABNORMAL LOW (ref 6.5–8.1)

## 2023-07-31 LAB — CALR +MPL + E12-E15  (REFLEX)

## 2023-07-31 LAB — HAPTOGLOBIN: Haptoglobin: <10 mg/dL — ABNORMAL LOW (ref 42–296)

## 2023-07-31 LAB — CBG MONITORING, ED
Glucose-Capillary: 75 mg/dL (ref 70–99)
Glucose-Capillary: 111 mg/dL — ABNORMAL HIGH (ref 70–99)

## 2023-07-31 LAB — SAMPLE TO BLOOD BANK

## 2023-07-31 LAB — GLUCOSE, CAPILLARY: Glucose-Capillary: 173 mg/dL — ABNORMAL HIGH (ref 70–99)

## 2023-07-31 LAB — HCG, SERUM, QUALITATIVE: Preg, Serum: NEGATIVE

## 2023-07-31 LAB — PREPARE RBC (CROSSMATCH)

## 2023-07-31 MED ORDER — INSULIN GLARGINE-YFGN 100 UNIT/ML ~~LOC~~ SOLN
40.0000 [IU] | Freq: Every day | SUBCUTANEOUS | Status: DC
  Administered 2023-08-01 – 2023-08-02 (×2): 40 [IU] via SUBCUTANEOUS
  Filled 2023-07-31 (×2): qty 0.4

## 2023-07-31 MED ORDER — ACETAMINOPHEN 325 MG PO TABS: 650.0000 mg | ORAL_TABLET | Freq: Four times a day (QID) | ORAL | Status: DC | PRN

## 2023-07-31 MED ORDER — FERROUS SULFATE 325 (65 FE) MG PO TABS
325.0000 mg | ORAL_TABLET | Freq: Every day | ORAL | Status: DC
  Administered 2023-08-01 – 2023-08-02 (×2): 325 mg via ORAL
  Filled 2023-07-31 (×2): qty 1

## 2023-07-31 MED ORDER — PANTOPRAZOLE SODIUM 40 MG PO TBEC
40.0000 mg | DELAYED_RELEASE_TABLET | Freq: Every day | ORAL | Status: DC
Start: 2023-08-01 — End: 2023-08-02
  Administered 2023-08-01 – 2023-08-02 (×2): 40 mg via ORAL
  Filled 2023-07-31 (×2): qty 1

## 2023-07-31 MED ORDER — SODIUM CHLORIDE 0.9% IV SOLUTION: Freq: Once | INTRAVENOUS | Status: AC

## 2023-07-31 MED ORDER — MEGESTROL ACETATE 40 MG PO TABS: 40.0000 mg | ORAL_TABLET | Freq: Two times a day (BID) | ORAL | Status: DC

## 2023-07-31 MED ORDER — INSULIN ASPART 100 UNIT/ML IJ SOLN
0.0000 [IU] | Freq: Three times a day (TID) | INTRAMUSCULAR | Status: DC
  Administered 2023-08-01 (×3): 9 [IU] via SUBCUTANEOUS
  Administered 2023-08-02: 5 [IU] via SUBCUTANEOUS
  Administered 2023-08-02: 3 [IU] via SUBCUTANEOUS

## 2023-07-31 MED ORDER — VITAMIN B-12 1000 MCG PO TABS
2500.0000 ug | ORAL_TABLET | Freq: Every day | ORAL | Status: DC
  Administered 2023-08-01 – 2023-08-02 (×2): 2500 ug via ORAL
  Filled 2023-07-31 (×2): qty 3

## 2023-07-31 MED ORDER — GABAPENTIN 400 MG PO CAPS
800.0000 mg | ORAL_CAPSULE | Freq: Three times a day (TID) | ORAL | Status: DC
  Administered 2023-08-01 – 2023-08-02 (×4): 800 mg via ORAL
  Filled 2023-07-31 (×5): qty 2

## 2023-07-31 MED ORDER — AMITRIPTYLINE HCL 25 MG PO TABS
25.0000 mg | ORAL_TABLET | Freq: Every day | ORAL | Status: DC
Start: 2023-08-01 — End: 2023-08-02
  Administered 2023-08-01: 25 mg via ORAL
  Filled 2023-07-31: qty 1

## 2023-07-31 MED ORDER — GLIPIZIDE 10 MG PO TABS
10.0000 mg | ORAL_TABLET | Freq: Every day | ORAL | Status: DC
  Administered 2023-08-01: 10 mg via ORAL
  Filled 2023-07-31: qty 1

## 2023-07-31 MED ORDER — VITAMIN B-12 2500 MCG SL SUBL: 1.0000 | SUBLINGUAL_TABLET | Freq: Every day | SUBLINGUAL | Status: DC

## 2023-07-31 MED ORDER — MEGESTROL ACETATE 40 MG PO TABS
40.0000 mg | ORAL_TABLET | Freq: Every morning | ORAL | Status: DC
Start: 2023-08-01 — End: 2023-08-02
  Administered 2023-08-01: 40 mg via ORAL
  Filled 2023-07-31 (×2): qty 1

## 2023-07-31 MED ORDER — POLYETHYLENE GLYCOL 3350 17 G PO PACK: 17.0000 g | PACK | Freq: Every day | ORAL | Status: DC | PRN

## 2023-07-31 MED ORDER — FUROSEMIDE 20 MG PO TABS
20.0000 mg | ORAL_TABLET | Freq: Every morning | ORAL | Status: DC
  Administered 2023-08-01 – 2023-08-02 (×2): 20 mg via ORAL
  Filled 2023-07-31 (×2): qty 1

## 2023-07-31 MED ORDER — ALBUTEROL SULFATE (2.5 MG/3ML) 0.083% IN NEBU: 2.5000 mg | INHALATION_SOLUTION | Freq: Four times a day (QID) | RESPIRATORY_TRACT | Status: DC | PRN

## 2023-07-31 MED ORDER — LOSARTAN POTASSIUM 50 MG PO TABS
25.0000 mg | ORAL_TABLET | Freq: Every day | ORAL | Status: DC
  Administered 2023-08-01 – 2023-08-02 (×2): 25 mg via ORAL
  Filled 2023-07-31 (×2): qty 1

## 2023-07-31 MED ORDER — OXYCODONE-ACETAMINOPHEN 5-325 MG PO TABS
1.0000 | ORAL_TABLET | Freq: Four times a day (QID) | ORAL | Status: DC | PRN
  Administered 2023-07-31 – 2023-08-02 (×4): 1 via ORAL
  Filled 2023-07-31 (×4): qty 1

## 2023-07-31 MED ORDER — DOXYCYCLINE HYCLATE 100 MG PO TABS
100.0000 mg | ORAL_TABLET | Freq: Two times a day (BID) | ORAL | Status: AC
  Administered 2023-07-31 – 2023-08-01 (×3): 100 mg via ORAL
  Filled 2023-07-31 (×3): qty 1

## 2023-07-31 MED ORDER — MEGESTROL ACETATE 40 MG PO TABS
80.0000 mg | ORAL_TABLET | Freq: Every day | ORAL | Status: DC
  Administered 2023-07-31 – 2023-08-01 (×2): 80 mg via ORAL
  Filled 2023-07-31 (×3): qty 2

## 2023-07-31 MED ORDER — INSULIN ASPART 100 UNIT/ML IJ SOLN
0.0000 [IU] | Freq: Every day | INTRAMUSCULAR | Status: DC
Start: 2023-07-31 — End: 2023-08-02
  Administered 2023-08-01: 2 [IU] via SUBCUTANEOUS

## 2023-07-31 MED ORDER — SODIUM CHLORIDE 0.9% FLUSH
3.0000 mL | Freq: Two times a day (BID) | INTRAVENOUS | Status: DC
  Administered 2023-07-31 – 2023-08-02 (×5): 3 mL via INTRAVENOUS

## 2023-07-31 MED ORDER — FLUTICASONE FUROATE-VILANTEROL 100-25 MCG/ACT IN AEPB
1.0000 | INHALATION_SPRAY | Freq: Every day | RESPIRATORY_TRACT | Status: DC
  Administered 2023-08-01 – 2023-08-02 (×2): 1 via RESPIRATORY_TRACT
  Filled 2023-07-31: qty 28

## 2023-07-31 MED ORDER — TIZANIDINE HCL 4 MG PO TABS
4.0000 mg | ORAL_TABLET | Freq: Two times a day (BID) | ORAL | Status: DC | PRN
  Administered 2023-07-31: 4 mg via ORAL
  Filled 2023-07-31: qty 1

## 2023-07-31 MED ORDER — GABAPENTIN 800 MG PO TABS: 800.0000 mg | ORAL_TABLET | Freq: Three times a day (TID) | ORAL | Status: DC

## 2023-07-31 MED ORDER — ACETAMINOPHEN 650 MG RE SUPP: 650.0000 mg | Freq: Four times a day (QID) | RECTAL | Status: DC | PRN

## 2023-07-31 MED ORDER — ADULT MULTIVITAMIN W/MINERALS CH
1.0000 | ORAL_TABLET | Freq: Every day | ORAL | Status: DC
  Administered 2023-08-01 – 2023-08-02 (×2): 1 via ORAL
  Filled 2023-07-31 (×3): qty 1

## 2023-07-31 MED ORDER — GLIPIZIDE 10 MG PO TABS
20.0000 mg | ORAL_TABLET | Freq: Every day | ORAL | Status: DC
  Administered 2023-08-01: 20 mg via ORAL
  Filled 2023-07-31: qty 2

## 2023-07-31 MED ORDER — METOPROLOL TARTRATE 25 MG PO TABS
25.0000 mg | ORAL_TABLET | Freq: Two times a day (BID) | ORAL | Status: DC
  Administered 2023-07-31 – 2023-08-02 (×4): 25 mg via ORAL
  Filled 2023-07-31 (×4): qty 1

## 2023-07-31 NOTE — Assessment & Plan Note (Signed)
 Saw OBGYN for the first time on 07/21/2023 s.p premarin  25 mg iv and megace  BID. Patient says bleeding imrpoved and no bleeding X 24 hours. C.w. megace . Patient pending IUD or endometrial ablation

## 2023-07-31 NOTE — ED Provider Notes (Signed)
 [place] EMERGENCY DEPARTMENT AT [facility] Provider Note   CSN: [id number] Arrival date & time: 07/31/23  1047     History  Chief Complaint  Patient presents with   Abnormal Lab    Tashena L Gragg is a 43 y.o. female.  HPI     43yo female with history of type II DM, asthma, admission for influenza in February for which she required intubation for hypoxic respiratory failure, admission in January for which she had sepsis due to cellulitis and UTI, chronic pain, history of menorrhagia requiring blood transfusion in the past who presents with concern for anemia on outpatient labs.   She is seeing OB/GYN, was taking Megace  and had 1 dose of Premarin  in the ED. OB/GYN's plan was to increase her dose of Megace  for now. Reports she uses depends when she has her menses and changes then 4 times a day.  She has been bleeding for 12 days. Today is the first day she has not had bleeding.   Was seen with a hemoglobin of 7.1 in ED (was 6.3 the same day as outpt) and given a blood transfusion on May 6.  Was seen again on May 10 at which time her hgb was 8  She reports this is her 5th blood transfusion this month.   She has been feeling faituged, lightheaded. Mild dyspnea with fatigue. No chest pain, no leg pain or swelling, no hx of dvt/PE. No abd pain/n/v/d no black or bloody stools.   Has hx of menorrhagia   Past Medical History:  Diagnosis Date   Anemia    Arthritis 08/17/2020   Chronic back pain    Diabetes mellitus without complication (HCC)    type 2   GERD (gastroesophageal reflux disease)    Migraine    Ovarian cyst    Pneumonia    Polyneuropathy      Home Medications Prior to Admission medications   Medication Sig Start Date End Date Taking? Authorizing Provider  acetaminophen  (TYLENOL ) 500 MG tablet Take 1,000 mg by mouth every 6 (six) hours as needed for mild pain (pain score 1-3) or moderate pain (pain score 4-6).   Yes [provider]   albuterol  (VENTOLIN  HFA) 108 (90 Base) MCG/ACT inhaler Inhale 2 puffs into the lungs every 6 (six) hours as needed for wheezing or shortness of breath. 05/29/23  Yes Setzer, Hamp Levine, PA-C  amitriptyline  (ELAVIL ) 25 MG tablet Take 1 tablet (25 mg total) by mouth at bedtime. 01/31/23  Yes Wess Hammed, NP  Continuous Glucose Sensor (DEXCOM G7 SENSOR) MISC 1 Device by Does not apply route as directed. Change sensor every 10 days Patient taking differently: Inject 1 Device into the skin See admin instructions. Change sensor every 10 days 06/21/23  Yes Shamleffer, Ibtehal Jaralla, MD  Cyanocobalamin  (VITAMIN B-12) 2500 MCG SUBL Place 1 tablet (2,500 mcg total) under the tongue daily. 04/23/23  Yes Timmy Forbes, MD  doxycycline  (VIBRAMYCIN ) 100 MG capsule Take 1 capsule (100 mg total) by mouth 2 (two) times daily. 07/21/23  Yes Auston Blush, MD  ferrous sulfate  325 (65 FE) MG tablet Take 1 tablet (325 mg total) by mouth daily with breakfast. 05/29/23  Yes Setzer, Sandra J, PA-C  furosemide  (LASIX ) 20 MG tablet Take 1 tablet (20 mg total) by mouth in the morning. 07/24/23  Yes Tolia, Sunit, DO  gabapentin  (NEURONTIN ) 800 MG tablet Take 800 mg by mouth 3 (three) times daily.   Yes [provider]  glipiZIDE  (GLUCOTROL )  10 MG tablet Take 2 tablets (20 mg total) by mouth daily before breakfast AND 1 tablet (10 mg total) daily before supper. 06/21/23  Yes Shamleffer, Ibtehal Jaralla, MD  insulin  glargine (LANTUS  SOLOSTAR) 100 UNIT/ML Solostar Pen Inject 40 Units into the skin daily before breakfast. 06/21/23  Yes Shamleffer, Ibtehal Jaralla, MD  losartan  (COZAAR ) 25 MG tablet Take 1 tablet (25 mg total) by mouth daily. 07/16/23  Yes Tolia, Sunit, DO  megestrol  (MEGACE ) 40 MG tablet Take one by mouth in the morning and two at night 07/30/23  Yes Tresia Fruit, MD  metoprolol  tartrate (LOPRESSOR ) 25 MG tablet Take 1 tablet (25 mg total) by mouth 2 (two) times daily. 05/29/23 07/31/23 Yes Setzer, Sandra J, PA-C   mometasone -formoterol  (DULERA ) 100-5 MCG/ACT AERO Inhale 2 puffs into the lungs 2 (two) times daily at 10 AM and 5 PM. 05/29/23  Yes Setzer, Sandra J, PA-C  Multiple Vitamin (MULTIVITAMIN WITH MINERALS) TABS tablet Take 1 tablet by mouth daily. 05/29/23  Yes Setzer, Sandra J, PA-C  oxyCODONE -acetaminophen  (PERCOCET/ROXICET) 5-325 MG tablet Take 1 tablet by mouth every 6 (six) hours as needed for moderate pain (pain score 4-6) or severe pain (pain score 7-10).   Yes [provider]  pantoprazole  (PROTONIX ) 40 MG tablet Take 1 tablet (40 mg total) by mouth daily. 05/29/23  Yes Setzer, Sandra J, PA-C  tiZANidine  (ZANAFLEX ) 4 MG tablet Take 4 mg by mouth every 12 (twelve) hours as needed for muscle spasms. 06/10/23  Yes [provider]  vitamin C  (ASCORBIC ACID ) 250 MG tablet Take 2 tablets (500 mg total) by mouth daily. Patient taking differently: Take 250 mg by mouth daily. 04/10/22  Yes Timmy Forbes, MD  predniSONE  (DELTASONE ) 10 MG tablet Take 2 tablets (20 mg total) by mouth daily. Patient not taking: Reported on 07/31/2023 07/21/23   Auston Blush, MD      Allergies    Trulicity  [dulaglutide ]    Review of Systems   Review of Systems  Physical Exam Updated Vital Signs BP (!) 163/82 (BP Location: Left Arm)   Pulse 95   Temp 98.9 F (37.2 C)   Resp 17   Ht 5\' 9"  (1.753 m)   Wt 97.1 kg   LMP 07/19/2023 (Exact Date)   SpO2 100%   BMI 31.60 kg/m  Physical Exam Vitals and nursing note reviewed.  Constitutional:      General: She is not in acute distress.    Appearance: She is well-developed. She is not diaphoretic.  HENT:     Head: Normocephalic and atraumatic.  Eyes:     Conjunctiva/sclera: Conjunctivae normal.  Cardiovascular:     Rate and Rhythm: Normal rate and regular rhythm.     Heart sounds: Normal heart sounds. No murmur heard.    No friction rub. No gallop.  Pulmonary:     Effort: Pulmonary effort is normal. No respiratory distress.     Breath sounds: Normal  breath sounds. No wheezing or rales.  Abdominal:     General: There is no distension.     Palpations: Abdomen is soft.     Tenderness: There is no abdominal tenderness. There is no guarding.  Musculoskeletal:        General: No tenderness.     Cervical back: Normal range of motion.  Skin:    General: Skin is warm and dry.     Findings: No erythema or rash.  Neurological:     Mental Status: She is alert and oriented to person,  place, and time.     ED Results / Procedures / Treatments   Labs (all labs ordered are listed, but only abnormal results are displayed) Labs Reviewed  COMPREHENSIVE METABOLIC PANEL WITH GFR - Abnormal; Notable for the following components:      Result Value   Chloride 113 (*)    CO2 20 (*)    Glucose, Bld 170 (*)    Calcium  8.4 (*)    Total Protein 6.3 (*)    Albumin 3.1 (*)    AST 14 (*)    Anion gap 3 (*)    All other components within normal limits  CBC - Abnormal; Notable for the following components:   RBC 2.66 (*)    Hemoglobin 7.5 (*)    HCT 22.3 (*)    RDW 16.2 (*)    All other components within normal limits  CBG MONITORING, ED - Abnormal; Notable for the following components:   Glucose-Capillary 111 (*)    All other components within normal limits  HCG, SERUM, QUALITATIVE  APTT  PROTIME-INR  BASIC METABOLIC PANEL WITH GFR  CBC WITH DIFFERENTIAL/PLATELET  CBC  CBG MONITORING, ED  TYPE AND SCREEN  PREPARE RBC (CROSSMATCH)    EKG None  Radiology No results found.  Procedures Procedures    Medications Ordered in ED Medications  acetaminophen  (TYLENOL ) tablet 650 mg (has no administration in time range)    Or  acetaminophen  (TYLENOL ) suppository 650 mg (has no administration in time range)  polyethylene glycol (MIRALAX  / GLYCOLAX ) packet 17 g (has no administration in time range)  sodium chloride  flush (NS) 0.9 % injection 3 mL (3 mLs Intravenous Given 07/31/23 1622)  oxyCODONE -acetaminophen  (PERCOCET/ROXICET) 5-325 MG per  tablet 1 tablet (has no administration in time range)  furosemide  (LASIX ) tablet 20 mg (has no administration in time range)  losartan  (COZAAR ) tablet 25 mg (has no administration in time range)  metoprolol  tartrate (LOPRESSOR ) tablet 25 mg (has no administration in time range)  amitriptyline  (ELAVIL ) tablet 25 mg (has no administration in time range)  glipiZIDE  (GLUCOTROL ) tablet 20 mg (has no administration in time range)    And  glipiZIDE  (GLUCOTROL ) tablet 10 mg (has no administration in time range)  insulin  glargine-yfgn (SEMGLEE ) injection 40 Units (has no administration in time range)  pantoprazole  (PROTONIX ) EC tablet 40 mg (has no administration in time range)  ferrous sulfate  tablet 325 mg (has no administration in time range)  tiZANidine  (ZANAFLEX ) tablet 4 mg (has no administration in time range)  multivitamin with minerals tablet 1 tablet (1 tablet Oral Incomplete 07/31/23 1912)  albuterol  (PROVENTIL ) (2.5 MG/3ML) 0.083% nebulizer solution 2.5 mg (has no administration in time range)  fluticasone  furoate-vilanterol (BREO ELLIPTA) 100-25 MCG/ACT 1 puff (has no administration in time range)  gabapentin  (NEURONTIN ) capsule 800 mg (800 mg Oral Incomplete 07/31/23 1912)  megestrol  (MEGACE ) tablet 40 mg (has no administration in time range)    And  megestrol  (MEGACE ) tablet 80 mg (has no administration in time range)  insulin  aspart (novoLOG ) injection 0-5 Units (has no administration in time range)  insulin  aspart (novoLOG ) injection 0-9 Units (has no administration in time range)  cyanocobalamin  (VITAMIN B12) tablet 2,500 mcg (has no administration in time range)  doxycycline  (VIBRA -TABS) tablet 100 mg (has no administration in time range)  0.9 %  sodium chloride  infusion (Manually program via Guardrails IV Fluids) ( Intravenous New Bag/Given 07/31/23 1440)    ED Course/ Medical Decision Making/ A&P  43yo female with history of type II DM, asthma,  admission for influenza in February for which she required intubation for hypoxic respiratory failure, admission in January for which she had sepsis due to cellulitis and UTI, chronic pain, history of menorrhagia requiring blood transfusion in the past who presents with concern for anemia on outpatient labs.  Outpatient labs with hgb 6.8, while hgb here is 7.5--will transfuse in the setting symptomatic anemia with hgb of less than 7 on check earlier today.   Consented for transfusion and 1UpRBC ordered.  Other labs evaluated by me without clincially significant abnormalities.   Her hematologist, Dr. 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   She agrees with admission for symptomatic anemia and bone marrow biopsy and aspiration order placed per Dr. Jackqueline Mason recommendations.          Final Clinical Impression(s) / ED Diagnoses Final diagnoses:  Symptomatic anemia    Rx / DC Orders ED Discharge Orders     None         Scarlette Currier, MD 07/31/23 1916

## 2023-07-31 NOTE — ED Triage Notes (Signed)
 Patient had labs drawn 1 hour ago and was told her hemoglobin was 6.8. Denies bleeding anywhere. Has anemia. Feels tired and lightheaded.

## 2023-07-31 NOTE — H&P (Addendum)
 History and Physical    Patient: Melinda Stone JXB:[id number] DOB: 1980-11-08 DOA: 07/31/2023 DOS: the patient was seen and examined on 07/31/2023 PCP: Marcial Setting, NP  Patient coming from: Home  Chief Complaint:  Chief Complaint  Patient presents with   Abnormal Lab   HPI: Melinda Stone is a 43 y.o. female with medical history significant of medical issues as listed below.  Patient further has had menorrhagia for almost a year now.  Patient denies any other source of bleeding such as gum bleeding nosebleeding skin bruising or hematuria.  Patient has developed progressive fatigue over the last several weeks.  Patient was seen by hematology today, lab work revealed patient had severe anemia patient was felt to be symptomatic.  Patient was sent to the ER.  Here in the ER, patient hemoglobin 6.8, s/p 1 unit PRBC, medical evaluation is sought. Transferred to [facility] inpatient unti.  Patient denies any chest pain, any shortness of breath either at rest no presyncope loss of consciousness.  Patient has no fever no vomiting or diarrhea.  Patient reports that her menorrhagia stopped because her period stopped yesterday.  Therefore at this time patient has had no active bleeding.  Patient reports her chronic leg pain due to intervertebral disc prolapse.  Otherwise asymptomatic has been able to tolerate diet in the hospital.  Review of Systems: As mentioned in the history of present illness. All other systems reviewed and are negative. Past Medical History:  Diagnosis Date   Anemia    Arthritis 08/17/2020   Chronic back pain    Diabetes mellitus without complication (HCC)    type 2   GERD (gastroesophageal reflux disease)    Migraine    Ovarian cyst    Pneumonia    Polyneuropathy    Past Surgical History:  Procedure Laterality Date   CESAREAN SECTION     CHOLECYSTECTOMY  2004   EYE SURGERY Left    cataract   LUMBAR LAMINECTOMY N/A 05/30/2020   Procedure: Lumbar five  Laminectomy,  Bilateral Microdiscectomy, Left Lumbar five -Sacral one Microdiscectomy;  Surgeon: Adah Acron, MD;  Location: [facility] OR;  Service: Orthopedics;  Laterality: N/A;   LUMBAR LAMINECTOMY Left 11/14/2020   Procedure: LEFT LUMBAR FOUR-FIVE MICRODISCECTOMY;  Surgeon: Adah Acron, MD;  Location: [facility] OR;  Service: Orthopedics;  Laterality: Left;   TUBAL LIGATION  10/19/2010   Social History:  reports that she quit smoking about 19 months ago. Her smoking use included cigars. She has never used smokeless tobacco. She reports that she does not currently use alcohol . She reports that she does not currently use drugs.  Allergies  Allergen Reactions   Trulicity  [Dulaglutide ] Diarrhea    Family History  Problem Relation Age of Onset   Diabetes Mother    Hypertension Mother    Stroke Mother     Prior to Admission medications   Medication Sig Start Date End Date Taking? Authorizing Provider  acetaminophen  (TYLENOL ) 500 MG tablet Take 1,000 mg by mouth every 6 (six) hours as needed for mild pain (pain score 1-3) or moderate pain (pain score 4-6).   Yes [provider]  albuterol  (VENTOLIN  HFA) 108 (90 Base) MCG/ACT inhaler Inhale 2 puffs into the lungs every 6 (six) hours as needed for wheezing or shortness of breath. 05/29/23  Yes Setzer, Hamp Levine, PA-C  amitriptyline  (ELAVIL ) 25 MG tablet Take 1 tablet (25 mg total) by mouth at bedtime. 01/31/23  Yes Wess Hammed, NP  Continuous Glucose Sensor [facility]  G7 SENSOR) MISC 1 Device by Does not apply route as directed. Change sensor every 10 days Patient taking differently: Inject 1 Device into the skin See admin instructions. Change sensor every 10 days 06/21/23  Yes Shamleffer, Ibtehal Jaralla, MD  Cyanocobalamin  (VITAMIN B-12) 2500 MCG SUBL Place 1 tablet (2,500 mcg total) under the tongue daily. 04/23/23  Yes Timmy Forbes, MD  doxycycline  (VIBRAMYCIN ) 100 MG capsule Take 1 capsule (100 mg total) by mouth 2 (two) times daily. 07/21/23  Yes Auston Blush, MD  ferrous sulfate  325 (65 FE) MG tablet Take 1 tablet (325 mg total) by mouth daily with breakfast. 05/29/23  Yes Setzer, Sandra J, PA-C  furosemide  (LASIX ) 20 MG tablet Take 1 tablet (20 mg total) by mouth in the morning. 07/24/23  Yes Tolia, Sunit, DO  gabapentin  (NEURONTIN ) 800 MG tablet Take 800 mg by mouth 3 (three) times daily.   Yes [provider]  glipiZIDE  (GLUCOTROL ) 10 MG tablet Take 2 tablets (20 mg total) by mouth daily before breakfast AND 1 tablet (10 mg total) daily before supper. 06/21/23  Yes Shamleffer, Ibtehal Jaralla, MD  insulin  glargine (LANTUS  SOLOSTAR) 100 UNIT/ML Solostar Pen Inject 40 Units into the skin daily before breakfast. 06/21/23  Yes Shamleffer, Ibtehal Jaralla, MD  losartan  (COZAAR ) 25 MG tablet Take 1 tablet (25 mg total) by mouth daily. 07/16/23  Yes Tolia, Sunit, DO  megestrol  (MEGACE ) 40 MG tablet Take one by mouth in the morning and two at night 07/30/23  Yes Tresia Fruit, MD  metoprolol  tartrate (LOPRESSOR ) 25 MG tablet Take 1 tablet (25 mg total) by mouth 2 (two) times daily. 05/29/23 07/31/23 Yes Setzer, Sandra J, PA-C  mometasone -formoterol  (DULERA ) 100-5 MCG/ACT AERO Inhale 2 puffs into the lungs 2 (two) times daily at 10 AM and 5 PM. 05/29/23  Yes Setzer, Sandra J, PA-C  Multiple Vitamin (MULTIVITAMIN WITH MINERALS) TABS tablet Take 1 tablet by mouth daily. 05/29/23  Yes Setzer, Sandra J, PA-C  oxyCODONE -acetaminophen  (PERCOCET/ROXICET) 5-325 MG tablet Take 1 tablet by mouth every 6 (six) hours as needed for moderate pain (pain score 4-6) or severe pain (pain score 7-10).   Yes [provider]  pantoprazole  (PROTONIX ) 40 MG tablet Take 1 tablet (40 mg total) by mouth daily. 05/29/23  Yes Setzer, Sandra J, PA-C  tiZANidine  (ZANAFLEX ) 4 MG tablet Take 4 mg by mouth every 12 (twelve) hours as needed for muscle spasms. 06/10/23  Yes [provider]  vitamin C  (ASCORBIC ACID ) 250 MG tablet Take 2 tablets (500 mg total) by mouth  daily. Patient taking differently: Take 250 mg by mouth daily. 04/10/22  Yes Timmy Forbes, MD  predniSONE  (DELTASONE ) 10 MG tablet Take 2 tablets (20 mg total) by mouth daily. Patient not taking: Reported on 07/31/2023 07/21/23   Auston Blush, MD    Physical Exam: Vitals:   07/31/23 1551 07/31/23 1600 07/31/23 1615 07/31/23 1651  BP:  (!) 165/95 (!) 174/96 (!) 186/86  Pulse: 87   92  Resp: 19 (!) 23 15 18   Temp: 98.2 F (36.8 C)   97.8 F (36.6 C)  TempSrc:    Oral  SpO2: 100%   100%  Weight:      Height:       General: Obese appearing lady in no immediate distress Respiratory exam: Bilateral intravesicular Cardiovascular exam S1-S2 normal Abdomen all quadrants soft nontender Extremities warm without edema No focal motor deficit Data Reviewed:  Labs on Admission:  Results for orders placed or performed during  the hospital encounter of 07/31/23 (from the past 24 hours)  Type and screen [facility]     Status: None (Preliminary result)   Collection Time: 07/31/23 12:20 PM  Result Value Ref Range   ABO/RH(D) A POS    Antibody Screen NEG    Sample Expiration 08/03/2023,2359    Unit Number [id number]    Blood Component Type RBC LR PHER1    Unit division 00    Status of Unit ISSUED    Transfusion Status OK TO TRANSFUSE    Crossmatch Result      Compatible Performed at [facility], [address] [place], [place] [address]   Comprehensive metabolic panel     Status: Abnormal   Collection Time: 07/31/23 12:22 PM  Result Value Ref Range   Sodium 136 135 - 145 mmol/L   Potassium 4.8 3.5 - 5.1 mmol/L   Chloride 113 (H) 98 - 111 mmol/L   CO2 20 (L) 22 - 32 mmol/L   Glucose, Bld 170 (H) 70 - 99 mg/dL   BUN 20 6 - 20 mg/dL   Creatinine, Ser 5.28 0.44 - 1.00 mg/dL   Calcium  8.4 (L) 8.9 - 10.3 mg/dL   Total Protein 6.3 (L) 6.5 - 8.1 g/dL   Albumin 3.1 (L) 3.5 - 5.0 g/dL   AST 14 (L) 15 - 41 U/L   ALT 12 0 - 44 U/L   Alkaline Phosphatase  106 38 - 126 U/L   Total Bilirubin 1.0 0.0 - 1.2 mg/dL   GFR, Estimated >41 >32 mL/min   Anion gap 3 (L) 5 - 15  CBC     Status: Abnormal   Collection Time: 07/31/23 12:22 PM  Result Value Ref Range   WBC 8.4 4.0 - 10.5 K/uL   RBC 2.66 (L) 3.87 - 5.11 MIL/uL   Hemoglobin 7.5 (L) 12.0 - 15.0 g/dL   HCT 44.0 (L) 10.2 - 72.5 %   MCV 83.8 80.0 - 100.0 fL   MCH 28.2 26.0 - 34.0 pg   MCHC 33.6 30.0 - 36.0 g/dL   RDW 36.6 (H) 44.0 - 34.7 %   Platelets 167 150 - 400 K/uL   nRBC 0.0 0.0 - 0.2 %  hCG, serum, qualitative     Status: None   Collection Time: 07/31/23 12:22 PM  Result Value Ref Range   Preg, Serum NEGATIVE NEGATIVE  Prepare RBC (crossmatch)     Status: None   Collection Time: 07/31/23  1:48 PM  Result Value Ref Range   Order Confirmation      ORDER PROCESSED BY BLOOD BANK Performed at [facility], [address], [place], [place] [address]   CBG monitoring, ED     Status: None   Collection Time: 07/31/23  3:43 PM  Result Value Ref Range   Glucose-Capillary 75 70 - 99 mg/dL  CBG monitoring, ED     Status: Abnormal   Collection Time: 07/31/23  4:29 PM  Result Value Ref Range   Glucose-Capillary 111 (H) 70 - 99 mg/dL   Basic Metabolic Panel: Recent Labs  Lab 07/27/23 1752 07/31/23 1222  NA 139 136  K 4.7 4.8  CL 115* 113*  CO2 19* 20*  GLUCOSE 216* 170*  BUN 19 20  CREATININE 1.06* 0.90  CALCIUM  8.2* 8.4*   Liver Function Tests: Recent Labs  Lab 07/27/23 1752 07/31/23 1222  AST 19 14*  ALT 12 12  ALKPHOS 82 106  BILITOT 1.0 1.0  PROT 5.5* 6.3*  ALBUMIN 2.7* 3.1*   Recent Labs  Lab 07/27/23 1752  LIPASE 40   No results for input(s): "AMMONIA" in the last 168 hours. CBC: Recent Labs  Lab 07/27/23 1752 07/31/23 0848 07/31/23 1222  WBC 8.7 7.0 8.4  NEUTROABS  --  5.0  --   HGB 8.0* 6.8* 7.5*  HCT 23.6* 19.9* 22.3*  MCV 83.7 81.2 83.8  PLT 173 129* 167   Cardiac Enzymes: No results for input(s): "CKTOTAL", "CKMB",  "CKMBINDEX", "TROPONINIHS" in the last 168 hours.  BNP (last 3 results) Recent Labs    07/16/23 1040  PROBNP 1,695*   CBG: Recent Labs  Lab 07/31/23 1543 07/31/23 1629  GLUCAP 75 111*    Radiological Exams on Admission:  No results found.  chest X-ray    No intake/output data recorded. No intake/output data recorded.       Assessment and Plan: * Symptomatic anemia Severe. This is multifactorial - iron  def as well as menorhagia desccribed to me by patient. At this time s.p 1 unit PRBC will rechekc CBC and see how patient is doing symptaotmically.  Patinet has seen Dr. Wilhelmenia Harada of hematology in  and she is concerned that it has been "Refractory to blood transfusion. " And threfore she was contemplating a BM biopsy. I think for now menorhagia is sufficient explanation of being blood transfusion refractory. Will monitor.  Chlamydia This is a REPORTED diagnosis. Patinet says she was prescribed doxycycline  for this.  DM (diabetes mellitus) (HCC) Continue with glipizide  and insulin  glargine. Add sliding scale insulin   Anxiety Continue with amitriptyline   HTN (hypertension) Continue with metoprolol  and losartan   Prolapsed lumbosacral intervertebral disc Reported to my by patientn. C.w. percoet and gabapentin . PDMP reviewed.  Menorrhagia Saw OBGYN for the first time on 07/21/2023 s.p premarin  25 mg iv and megace  BID. Patient says bleeding imrpoved and no bleeding X 24 hours. C.w. megace . Patient pending IUD or endometrial ablation  Metabolic acidosis Mild, without elevation of anion gap.  I suspect this is compensation for respiratory alkalosis given patient's severe anemia.  I anticipate this will self resolved by the morning.  Esophageal reflux C/w pantop   C/w lasix  - indication not apparnet from chart review. Patient euvolemic   Advance Care Planning:   Code Status: Full Code   Consults: none at this time.  Family Communication: per  patient.  Severity of Illness: The appropriate patient status for this patient is OBSERVATION. Observation status is judged to be reasonable and necessary in order to provide the required intensity of service to ensure the patient's safety. The patient's presenting symptoms, physical exam findings, and initial radiographic and laboratory data in the context of their medical condition is felt to place them at decreased risk for further clinical deterioration. Furthermore, it is anticipated that the patient will be medically stable for discharge from the hospital within 2 midnights of admission.   Author: Bennie Brave, MD 07/31/2023 6:46 PM  For on call review [email or web address].

## 2023-07-31 NOTE — Assessment & Plan Note (Signed)
 This is a REPORTED diagnosis. Patinet says she was prescribed doxycycline  for this.

## 2023-07-31 NOTE — Assessment & Plan Note (Signed)
 Continue with glipizide  and insulin  glargine. Add sliding scale insulin 

## 2023-07-31 NOTE — Telephone Encounter (Signed)
 Critical lab received from [facility] in [place] lab. Hemoglobin 6.8. MD aware and pt was sent to ER for blood transfusion. Pt prefers to go to ER in [place].

## 2023-07-31 NOTE — Assessment & Plan Note (Signed)
 Reported to my by patientn. C.w. percoet and gabapentin . PDMP reviewed.

## 2023-07-31 NOTE — Assessment & Plan Note (Signed)
Continue with amitriptyline and venlafaxine.  

## 2023-07-31 NOTE — Assessment & Plan Note (Signed)
 Cw. pantop

## 2023-07-31 NOTE — Assessment & Plan Note (Signed)
 Severe. This is multifactorial - iron  def as well as menorhagia desccribed to me by patient. At this time s.p 1 unit PRBC will rechekc CBC and see how patient is doing symptaotmically.  Patinet has seen Dr. Wilhelmenia Harada of hematology in [place] and she is concerned that it has been "Refractory to blood transfusion. " And threfore she was contemplating a BM biopsy. I think for now menorhagia is sufficient explanation of being blood transfusion refractory. Will monitor.

## 2023-07-31 NOTE — Assessment & Plan Note (Signed)
-   Continue with metoprolol and losartan.

## 2023-07-31 NOTE — Assessment & Plan Note (Signed)
 Mild, without elevation of anion gap.  I suspect this is compensation for respiratory alkalosis given patient's severe anemia.  I anticipate this will self resolved by the morning.

## 2023-08-01 ENCOUNTER — Ambulatory Visit (HOSPITAL_COMMUNITY): Admit: 2023-08-01 | Admitting: Orthopedic Surgery

## 2023-08-01 ENCOUNTER — Encounter: Payer: Self-pay | Admitting: Oncology

## 2023-08-01 ENCOUNTER — Observation Stay (HOSPITAL_COMMUNITY)

## 2023-08-01 DIAGNOSIS — E872 Acidosis, unspecified: Secondary | ICD-10-CM | POA: Diagnosis present

## 2023-08-01 DIAGNOSIS — Z79899 Other long term (current) drug therapy: Secondary | ICD-10-CM | POA: Diagnosis not present

## 2023-08-01 DIAGNOSIS — I10 Essential (primary) hypertension: Secondary | ICD-10-CM | POA: Diagnosis present

## 2023-08-01 DIAGNOSIS — D500 Iron deficiency anemia secondary to blood loss (chronic): Secondary | ICD-10-CM | POA: Diagnosis present

## 2023-08-01 DIAGNOSIS — D696 Thrombocytopenia, unspecified: Secondary | ICD-10-CM | POA: Diagnosis present

## 2023-08-01 DIAGNOSIS — Z833 Family history of diabetes mellitus: Secondary | ICD-10-CM | POA: Diagnosis not present

## 2023-08-01 DIAGNOSIS — Z8249 Family history of ischemic heart disease and other diseases of the circulatory system: Secondary | ICD-10-CM | POA: Diagnosis not present

## 2023-08-01 DIAGNOSIS — Z888 Allergy status to other drugs, medicaments and biological substances status: Secondary | ICD-10-CM | POA: Diagnosis not present

## 2023-08-01 DIAGNOSIS — Z823 Family history of stroke: Secondary | ICD-10-CM | POA: Diagnosis not present

## 2023-08-01 DIAGNOSIS — K219 Gastro-esophageal reflux disease without esophagitis: Secondary | ICD-10-CM | POA: Diagnosis present

## 2023-08-01 DIAGNOSIS — E1142 Type 2 diabetes mellitus with diabetic polyneuropathy: Secondary | ICD-10-CM | POA: Diagnosis present

## 2023-08-01 DIAGNOSIS — G8929 Other chronic pain: Secondary | ICD-10-CM | POA: Diagnosis present

## 2023-08-01 DIAGNOSIS — Z794 Long term (current) use of insulin: Secondary | ICD-10-CM | POA: Diagnosis not present

## 2023-08-01 DIAGNOSIS — F17290 Nicotine dependence, other tobacco product, uncomplicated: Secondary | ICD-10-CM | POA: Diagnosis present

## 2023-08-01 DIAGNOSIS — Z7984 Long term (current) use of oral hypoglycemic drugs: Secondary | ICD-10-CM | POA: Diagnosis not present

## 2023-08-01 DIAGNOSIS — Z7951 Long term (current) use of inhaled steroids: Secondary | ICD-10-CM | POA: Diagnosis not present

## 2023-08-01 DIAGNOSIS — D649 Anemia, unspecified: Secondary | ICD-10-CM | POA: Diagnosis present

## 2023-08-01 DIAGNOSIS — N920 Excessive and frequent menstruation with regular cycle: Secondary | ICD-10-CM | POA: Diagnosis present

## 2023-08-01 DIAGNOSIS — F419 Anxiety disorder, unspecified: Secondary | ICD-10-CM | POA: Diagnosis present

## 2023-08-01 DIAGNOSIS — M5127 Other intervertebral disc displacement, lumbosacral region: Secondary | ICD-10-CM | POA: Diagnosis present

## 2023-08-01 HISTORY — PX: IR BONE MARROW BIOPSY & ASPIRATION: IMG5727

## 2023-08-01 LAB — CBC WITH DIFFERENTIAL/PLATELET
Abs Immature Granulocytes: 0.06 10*3/uL (ref 0.00–0.07)
Basophils Absolute: 0.1 10*3/uL (ref 0.0–0.1)
Basophils Relative: 1 %
Eosinophils Absolute: 0 10*3/uL (ref 0.0–0.5)
Eosinophils Relative: 0 %
HCT: 27.6 % — ABNORMAL LOW (ref 36.0–46.0)
Hemoglobin: 9.2 g/dL — ABNORMAL LOW (ref 12.0–15.0)
Immature Granulocytes: 1 %
Lymphocytes Relative: 7 %
Lymphs Abs: 0.7 10*3/uL (ref 0.7–4.0)
MCH: 28 pg (ref 26.0–34.0)
MCHC: 33.3 g/dL (ref 30.0–36.0)
MCV: 84.1 fL (ref 80.0–100.0)
Monocytes Absolute: 0.1 10*3/uL (ref 0.1–1.0)
Monocytes Relative: 1 %
Neutro Abs: 9.5 10*3/uL — ABNORMAL HIGH (ref 1.7–7.7)
Neutrophils Relative %: 90 %
Platelets: 175 10*3/uL (ref 150–400)
RBC: 3.28 MIL/uL — ABNORMAL LOW (ref 3.87–5.11)
RDW: 15.8 % — ABNORMAL HIGH (ref 11.5–15.5)
WBC: 10.4 10*3/uL (ref 4.0–10.5)
nRBC: 0 % (ref 0.0–0.2)

## 2023-08-01 LAB — GLUCOSE, CAPILLARY
Glucose-Capillary: 421 mg/dL — ABNORMAL HIGH (ref 70–99)
Glucose-Capillary: 386 mg/dL — ABNORMAL HIGH (ref 70–99)
Glucose-Capillary: 365 mg/dL — ABNORMAL HIGH (ref 70–99)
Glucose-Capillary: 366 mg/dL — ABNORMAL HIGH (ref 70–99)
Glucose-Capillary: 246 mg/dL — ABNORMAL HIGH (ref 70–99)

## 2023-08-01 LAB — TYPE AND SCREEN
ABO/RH(D): A POS
Antibody Screen: NEGATIVE
Unit division: 0

## 2023-08-01 LAB — BASIC METABOLIC PANEL WITH GFR
Anion gap: 9 (ref 5–15)
Anion gap: 6 (ref 5–15)
BUN: 21 mg/dL — ABNORMAL HIGH (ref 6–20)
BUN: 23 mg/dL — ABNORMAL HIGH (ref 6–20)
CO2: 16 mmol/L — ABNORMAL LOW (ref 22–32)
CO2: 18 mmol/L — ABNORMAL LOW (ref 22–32)
Calcium: 8.7 mg/dL — ABNORMAL LOW (ref 8.9–10.3)
Calcium: 8.9 mg/dL (ref 8.9–10.3)
Chloride: 107 mmol/L (ref 98–111)
Chloride: 110 mmol/L (ref 98–111)
Creatinine, Ser: 0.8 mg/dL (ref 0.44–1.00)
Creatinine, Ser: 0.89 mg/dL (ref 0.44–1.00)
GFR, Estimated: >60 mL/min (ref >60)
GFR, Estimated: >60 mL/min (ref >60)
Glucose, Bld: 367 mg/dL — ABNORMAL HIGH (ref 70–99)
Glucose, Bld: 423 mg/dL — ABNORMAL HIGH (ref 70–99)
Potassium: 4.8 mmol/L (ref 3.5–5.1)
Potassium: 4.9 mmol/L (ref 3.5–5.1)
Sodium: 132 mmol/L — ABNORMAL LOW (ref 135–145)
Sodium: 134 mmol/L — ABNORMAL LOW (ref 135–145)

## 2023-08-01 LAB — PROTIME-INR
INR: 1 (ref 0.8–1.2)
Prothrombin Time: 13.6 s (ref 11.4–15.2)

## 2023-08-01 LAB — APTT: aPTT: 48 s — ABNORMAL HIGH (ref 24–36)

## 2023-08-01 LAB — DIRECT ANTIGLOBULIN TEST (NOT AT ARMC)
DAT, IgG: NEGATIVE
DAT, complement: NEGATIVE

## 2023-08-01 LAB — BPAM RBC
Blood Product Expiration Date: 202506082359
ISSUE DATE / TIME: 202505141436
Unit Type and Rh: 6200

## 2023-08-01 LAB — LACTATE DEHYDROGENASE: LDH: 263 U/L — ABNORMAL HIGH (ref 98–192)

## 2023-08-01 LAB — FIBRINOGEN: Fibrinogen: 234 mg/dL (ref 210–475)

## 2023-08-01 SURGERY — TRANSFORAMINAL LUMBAR INTERBODY FUSION (TLIF) WITH PEDICLE SCREW FIXATION 1 LEVEL
Anesthesia: General | Laterality: Left

## 2023-08-01 MED ORDER — FENTANYL CITRATE (PF) 100 MCG/2ML IJ SOLN
INTRAMUSCULAR | Status: AC
  Filled 2023-08-01: qty 2

## 2023-08-01 MED ORDER — LIDOCAINE HCL (PF) 1 % IJ SOLN: 20.0000 mL | Freq: Once | INTRAMUSCULAR | Status: DC

## 2023-08-01 MED ORDER — LIDOCAINE HCL (PF) 1 % IJ SOLN
INTRAMUSCULAR | Status: AC
  Filled 2023-08-01: qty 30

## 2023-08-01 MED ORDER — MIDAZOLAM HCL 2 MG/2ML IJ SOLN
INTRAMUSCULAR | Status: AC
  Filled 2023-08-01: qty 2

## 2023-08-01 MED ORDER — FENTANYL CITRATE (PF) 100 MCG/2ML IJ SOLN
INTRAMUSCULAR | Status: AC | PRN
  Administered 2023-08-01 (×3): 50 ug via INTRAVENOUS

## 2023-08-01 MED ORDER — MIDAZOLAM HCL 2 MG/2ML IJ SOLN
INTRAMUSCULAR | Status: AC | PRN
  Administered 2023-08-01: 2 mg via INTRAVENOUS
  Administered 2023-08-01 (×2): 1 mg via INTRAVENOUS

## 2023-08-01 MED ORDER — DEXAMETHASONE SODIUM PHOSPHATE 10 MG/ML IJ SOLN
40.0000 mg | Freq: Every day | INTRAMUSCULAR | Status: DC
  Administered 2023-08-01: 40 mg via INTRAVENOUS
  Filled 2023-08-01: qty 4

## 2023-08-01 NOTE — Consult Note (Signed)
 Chief Complaint: Symptomatic anemia of uncertain etiology ; referred for image guided bone marrow biopsy  Referring Provider(s): Yu,Z  Supervising Physician: Elene Griffes  Patient Status: [facility] - In-pt  History of Present Illness: Melinda Stone is a 43 y.o. female ex smoker  with past medical history significant for arthritis, chronic back pain, diabetes, GERD, fibroid uterus, migraines, ovarian cyst, vit B12 deficiency, polyneuropathy ,fatty liver/ hepatomegaly and chronic iron  deficiency anemia-transfusion dependent, recent thrombocytopenia.  Pt does have fibroid uterus with history of menorrhagia as well . She was scheduled for outpatient bone marrow biopsy on 5/20 at [facility] due to concern for failure of erythropoiesis.  She now presents to [facility] with progressive fatigue, worsening anemia.  She has received packed red blood cells and platelets.  Case discussed with Dr. Wilhelmenia Harada and IR team will proceed with bone marrow biopsy today.   Patient is Full Code  Past Medical History:  Diagnosis Date   Anemia    Arthritis 08/17/2020   Chronic back pain    Diabetes mellitus without complication (HCC)    type 2   GERD (gastroesophageal reflux disease)    Migraine    Ovarian cyst    Pneumonia    Polyneuropathy     Past Surgical History:  Procedure Laterality Date   CESAREAN SECTION     CHOLECYSTECTOMY  2004   EYE SURGERY Left    cataract   LUMBAR LAMINECTOMY N/A 05/30/2020   Procedure: Lumbar five Laminectomy,  Bilateral Microdiscectomy, Left Lumbar five -Sacral one Microdiscectomy;  Surgeon: Adah Acron, MD;  Location: [facility] OR;  Service: Orthopedics;  Laterality: N/A;   LUMBAR LAMINECTOMY Left 11/14/2020   Procedure: LEFT LUMBAR FOUR-FIVE MICRODISCECTOMY;  Surgeon: Adah Acron, MD;  Location: [facility] OR;  Service: Orthopedics;  Laterality: Left;   TUBAL LIGATION  10/19/2010    Allergies: Trulicity  [dulaglutide ]  Medications: Prior to Admission  medications   Medication Sig Start Date End Date Taking? Authorizing Provider  acetaminophen  (TYLENOL ) 500 MG tablet Take 1,000 mg by mouth every 6 (six) hours as needed for mild pain (pain score 1-3) or moderate pain (pain score 4-6).   Yes [provider]  albuterol  (VENTOLIN  HFA) 108 (90 Base) MCG/ACT inhaler Inhale 2 puffs into the lungs every 6 (six) hours as needed for wheezing or shortness of breath. 05/29/23  Yes Setzer, Sandra J, PA-C  amitriptyline  (ELAVIL ) 25 MG tablet Take 1 tablet (25 mg total) by mouth at bedtime. 01/31/23  Yes Wess Hammed, NP  Continuous Glucose Sensor (DEXCOM G7 SENSOR) MISC 1 Device by Does not apply route as directed. Change sensor every 10 days Patient taking differently: Inject 1 Device into the skin See admin instructions. Change sensor every 10 days 06/21/23  Yes Shamleffer, Ibtehal Jaralla, MD  Cyanocobalamin  (VITAMIN B-12) 2500 MCG SUBL Place 1 tablet (2,500 mcg total) under the tongue daily. 04/23/23  Yes Timmy Forbes, MD  doxycycline  (VIBRAMYCIN ) 100 MG capsule Take 1 capsule (100 mg total) by mouth 2 (two) times daily. 07/21/23  Yes Auston Blush, MD  ferrous sulfate  325 (65 FE) MG tablet Take 1 tablet (325 mg total) by mouth daily with breakfast. 05/29/23  Yes Setzer, Sandra J, PA-C  furosemide  (LASIX ) 20 MG tablet Take 1 tablet (20 mg total) by mouth in the morning. 07/24/23  Yes Tolia, Sunit, DO  gabapentin  (NEURONTIN ) 800 MG tablet Take 800 mg by mouth 3 (three) times daily.   Yes [provider]  glipiZIDE  (GLUCOTROL ) 10 MG  tablet Take 2 tablets (20 mg total) by mouth daily before breakfast AND 1 tablet (10 mg total) daily before supper. 06/21/23  Yes Shamleffer, Ibtehal Jaralla, MD  insulin  glargine (LANTUS  SOLOSTAR) 100 UNIT/ML Solostar Pen Inject 40 Units into the skin daily before breakfast. 06/21/23  Yes Shamleffer, Ibtehal Jaralla, MD  losartan  (COZAAR ) 25 MG tablet Take 1 tablet (25 mg total) by mouth daily. 07/16/23  Yes Tolia, Sunit, DO   megestrol  (MEGACE ) 40 MG tablet Take one by mouth in the morning and two at night 07/30/23  Yes Tresia Fruit, MD  metoprolol  tartrate (LOPRESSOR ) 25 MG tablet Take 1 tablet (25 mg total) by mouth 2 (two) times daily. 05/29/23 07/31/23 Yes Setzer, Sandra J, PA-C  mometasone -formoterol  (DULERA ) 100-5 MCG/ACT AERO Inhale 2 puffs into the lungs 2 (two) times daily at 10 AM and 5 PM. 05/29/23  Yes Setzer, Sandra J, PA-C  Multiple Vitamin (MULTIVITAMIN WITH MINERALS) TABS tablet Take 1 tablet by mouth daily. 05/29/23  Yes Setzer, Sandra J, PA-C  oxyCODONE -acetaminophen  (PERCOCET/ROXICET) 5-325 MG tablet Take 1 tablet by mouth every 6 (six) hours as needed for moderate pain (pain score 4-6) or severe pain (pain score 7-10).   Yes [provider]  pantoprazole  (PROTONIX ) 40 MG tablet Take 1 tablet (40 mg total) by mouth daily. 05/29/23  Yes Setzer, Sandra J, PA-C  tiZANidine  (ZANAFLEX ) 4 MG tablet Take 4 mg by mouth every 12 (twelve) hours as needed for muscle spasms. 06/10/23  Yes [provider]  vitamin C  (ASCORBIC ACID ) 250 MG tablet Take 2 tablets (500 mg total) by mouth daily. Patient taking differently: Take 250 mg by mouth daily. 04/10/22  Yes Timmy Forbes, MD  predniSONE  (DELTASONE ) 10 MG tablet Take 2 tablets (20 mg total) by mouth daily. Patient not taking: Reported on 07/31/2023 07/21/23   Auston Blush, MD     Family History  Problem Relation Age of Onset   Diabetes Mother    Hypertension Mother    Stroke Mother     Social History   Socioeconomic History   Marital status: Single    Spouse name: Not on file   Number of children: 3   Years of education: Not on file   Highest education level: High school graduate  Occupational History    Comment: home maker  Tobacco Use   Smoking status: Former    Types: Cigars    Quit date: 12/23/2021    Years since quitting: 1.6   Smokeless tobacco: Never   Tobacco comments:    2 cigars daily. Khj 02/12/2023  Vaping Use   Vaping  status: Never Used  Substance and Sexual Activity   Alcohol  use: Not Currently   Drug use: Not Currently    Comment: per patient stopped smoking marijuana Mar 2022   Sexual activity: Yes    Birth control/protection: Surgical  Other Topics Concern   Not on file  Social History Narrative   Lives with 3 children   Some coffee   Social Drivers of Corporate investment banker Strain: Not on file  Food Insecurity: No Food Insecurity (07/31/2023)   Hunger Vital Sign    Worried About Running Out of Food in the Last Year: Never true    Ran Out of Food in the Last Year: Never true  Transportation Needs: No Transportation Needs (07/31/2023)   PRAPARE - Administrator, Civil Service (Medical): No    Lack of Transportation (Non-Medical): No  Physical Activity: Not on file  Stress: Not on file  Social Connections: Moderately Isolated (07/31/2023)   Social Connection and Isolation Panel [NHANES]    Frequency of Communication with Friends and Family: Twice a week    Frequency of Social Gatherings with Friends and Family: Twice a week    Attends Religious Services: 1 to 4 times per year    Active Member of [employer] or Organizations: No    Attends Banker Meetings: Never    Marital Status: Never married       Review of Systems denies fever, headache, chest pain, worsening dyspnea, cough, nausea, vomiting or physical bleeding.  She does have some abdominal/back discomfort, history of menorrhagia also noted  Vital Signs: BP (!) 173/91 (BP Location: Left Arm)   Pulse 89   Temp 98.3 F (36.8 C)   Resp 15   Ht 5\' 9"  (1.753 m)   Wt 214 lb (97.1 kg)   LMP 07/19/2023 (Exact Date)   SpO2 99%   BMI 31.60 kg/m   Advance Care Plan: no documents on file   Physical Exam awake, alert.  Chest with slightly diminished breath sounds right base, left clear.  Heart with tachycardic but regular rhythm.  Abdomen soft, positive bowel sounds, some mild generalized tenderness to  palpation.  No lower extremity edema.  Imaging: No results found.  Labs:  CBC: Recent Labs    07/31/23 1222 07/31/23 1933 07/31/23 2112 08/01/23 0540  WBC 8.4 6.1 8.3 10.4  HGB 7.5* 8.1* 8.3* 9.2*  HCT 22.3* 25.1* 25.3* 27.6*  PLT 167 29* 5* 175    COAGS: Recent Labs    05/11/23 0604 05/11/23 2030 05/12/23 1246 08/01/23 0540  INR 1.1  --  1.2 1.0  APTT  --  26 35 48*    BMP: Recent Labs    07/23/23 2100 07/27/23 1752 07/31/23 1222 08/01/23 0540  NA 136 139 136 132*  K 4.3 4.7 4.8 4.8  CL 112* 115* 113* 107  CO2 19* 19* 20* 16*  GLUCOSE 184* 216* 170* 367*  BUN 23* 19 20 21*  CALCIUM  8.1* 8.2* 8.4* 8.7*  CREATININE 1.23* 1.06* 0.90 0.80  GFRNONAA 57* >60 >60 >60    LIVER FUNCTION TESTS: Recent Labs    07/23/23 1548 07/23/23 2100 07/27/23 1752 07/31/23 1222  BILITOT 0.9 1.5* 1.0 1.0  AST 19 21 19  14*  ALT 14 16 12 12   ALKPHOS 111 115 82 106  PROT 6.2* 6.7 5.5* 6.3*  ALBUMIN 3.1* 3.3* 2.7* 3.1*    TUMOR MARKERS: No results for input(s): "AFPTM", "CEA", "CA199", "CHROMGRNA" in the last 8760 hours.  Assessment and Plan: 43 y.o. female ex smoker  with past medical history significant for arthritis, chronic back pain, diabetes, GERD, fibroid uterus, migraines, ovarian cyst, vit B12 deficiency, polyneuropathy ,fatty liver/ hepatomegaly and chronic iron  deficiency anemia-transfusion dependent, recent thrombocytopenia.  Pt does have fibroid uterus with history of menorrhagia as well . She was scheduled for outpatient bone marrow biopsy on 5/20 at [facility] due to concern for failure of erythropoiesis.  She now presents to [facility] with progressive fatigue, worsening anemia.  She has received packed red blood cells and platelets.  Case discussed with Dr. Wilhelmenia Harada and IR team will proceed with bone marrow biopsy today.Risks and benefits of procedure was discussed with the patient /daughter  including, but not limited to bleeding, infection, damage  to adjacent structures or low yield requiring additional tests.  All of the questions were answered and there is agreement to  proceed.  Consent signed and in chart.  Procedure scheduled for this morning  Thank you for allowing our service to participate in Melinda Stone 's care.  Electronically Signed: D. Honore Lux, PA-C   08/01/2023, 8:40 AM      I spent a total of 20 Minutes    in face to face in clinical consultation, greater than 50% of which was counseling/coordinating care for image guided bone marrow biopsy

## 2023-08-01 NOTE — Plan of Care (Signed)
  Problem: Education: Goal: Knowledge of General Education information will improve Description: Including pain rating scale, medication(s)/side effects and non-pharmacologic comfort measures Outcome: Progressing   Problem: Health Behavior/Discharge Planning: Goal: Ability to manage health-related needs will improve Outcome: Progressing   Problem: Clinical Measurements: Goal: Will remain free from infection Outcome: Progressing   Problem: Nutrition: Goal: Adequate nutrition will be maintained Outcome: Progressing   Problem: Elimination: Goal: Will not experience complications related to bowel motility Outcome: Progressing Goal: Will not experience complications related to urinary retention Outcome: Progressing   Problem: Pain Managment: Goal: General experience of comfort will improve and/or be controlled Outcome: Progressing   Problem: Skin Integrity: Goal: Risk for impaired skin integrity will decrease Outcome: Progressing   Problem: Education: Goal: Ability to describe self-care measures that may prevent or decrease complications (Diabetes Survival Skills Education) will improve Outcome: Progressing   Problem: Coping: Goal: Ability to adjust to condition or change in health will improve Outcome: Progressing   Problem: Clinical Measurements: Goal: Diagnostic test results will improve Outcome: Not Progressing   Problem: Activity: Goal: Risk for activity intolerance will decrease Outcome: Not Progressing

## 2023-08-01 NOTE — Progress Notes (Signed)
   08/01/23 1310  TOC Brief Assessment  Insurance and Status Reviewed  Patient has primary care physician Yes  Home environment has been reviewed Lives in an apartment with family  Prior level of function: Independent  Prior/Current Home Services No current home services  Social Drivers of Health Review SDOH reviewed no interventions necessary  Readmission risk has been reviewed Yes (NA)  Transition of care needs transition of care needs identified, TOC will continue to follow   There are no TOC needs at this time. TOC will follow for any new needs or recommendations.

## 2023-08-01 NOTE — Procedures (Signed)
 Interventional Radiology Procedure:   Indications: Normocytic anemia  Procedure: Fluoroscopic guided bone marrow biopsy  Findings: 2 aspirates and 1 core from right ilium  Complications: None     EBL: Minimal, less than 10 ml  Plan: Bedrest 1 hour   Valene Villa R. Julietta Ogren, MD  Pager: [phone]

## 2023-08-01 NOTE — Progress Notes (Addendum)
 Melinda Stone   DOB:Jan 11, 1981   WU#:[id number]      ASSESSMENT & PLAN:  Can trial her McMillion is a 43 year old female patient diagnosed with anemia and iron  deficiency anemia who has been getting IV Venofer  treatments.  She follows with Hematology/Dr. Wilhelmenia Harada and was sent to [facility] for bone marrow biopsy.  Anemia, normocytic Menorrhagia -Low hemoglobin 6.  8 on 07/31/2023.  She received PRBC transfusions. - Likely due to menorrhagia - Transfuse PRBC for hemoglobin <7.0 - Continue to monitor CBC with differential - Patient sent to [facility] for bone marrow biopsy which was done today.  Results pending. - Follows with Dr. Alfonso Ike.  Dr. Randye Buttner will follow for this admission.  History of iron  deficiency anemia -Low ferritin 12 noted 12/17/2022.  Most recent on 07/23/2023 improved to 206.  She is status post IV iron  infusions. - Recommend repeat iron  panel as outpatient with additional IV iron  if needed  Thrombocytopenia? - No previous history of thrombocytopenia - Noted low normal platelets on admission 5/14.  However decreased 5K and 29K.  Baseline has been in the mid to upper 100 range. - Platelets stable 175K at this time - Continue to monitor CBC with differential  Hypertension Diabetes Anxiety - Continue antihypertensives as ordered and monitor blood pressure - Continue diabetic meds with insulin  sliding scale - Continue amitriptyline  as ordered - Monitor closely  Code Status Full  Subjective:  Patient seen awake alert and oriented x 3, status post bone marrow biopsy done earlier.  Reports she feels okay and is still slightly sleepy from procedure.  Denies current menorrhagia and states she has been seeing GYN who has recommended IUD for control of menorrhagia.  No other acute distress is noted.  Objective:   Intake/Output Summary (Last 24 hours) at 08/01/2023 0930 Last data filed at 08/01/2023 0800 Gross per 24 hour  Intake 710 ml  Output --  Net 710 ml      PHYSICAL EXAMINATION: ECOG PERFORMANCE STATUS: 1 - Symptomatic but completely ambulatory  Vitals:   08/01/23 0759 08/01/23 0816  BP:    Pulse:    Resp:    Temp:    SpO2: 99% 99%   Filed Weights   07/31/23 1051  Weight: 214 lb (97.1 kg)    GENERAL: alert, no distress and comfortable SKIN: skin color, texture, turgor are normal, no rashes or significant lesions EYES: normal, conjunctiva are pink and non-injected, sclera clear OROPHARYNX: no exudate, no erythema and lips, buccal mucosa, and tongue normal  NECK: supple, thyroid  normal size, non-tender, without nodularity LYMPH: no palpable lymphadenopathy in the cervical, axillary or inguinal LUNGS: clear to auscultation and percussion with normal breathing effort HEART: regular rate & rhythm and no murmurs and no lower extremity edema ABDOMEN: abdomen soft, non-tender and normal bowel sounds MUSCULOSKELETAL: no cyanosis of digits and no clubbing  PSYCH: alert & oriented x 3 with fluent speech NEURO: no focal motor/sensory deficits   All questions were answered. The patient knows to call the clinic with any problems, questions or concerns.   The total time spent in the appointment was 40 minutes encounter with patient including review of chart and various tests results, discussions about plan of care and coordination of care plan  Jacqualin Mate, NP 08/01/2023 9:30 AM    Labs Reviewed:  Lab Results  Component Value Date   WBC 10.4 08/01/2023   HGB 9.2 (L) 08/01/2023   HCT 27.6 (L) 08/01/2023   MCV 84.1 08/01/2023   PLT  175 08/01/2023   Recent Labs    05/11/23 2030 05/12/23 0152 07/23/23 1548 07/23/23 2100 07/27/23 1752 07/31/23 1222 08/01/23 0540  NA  --    < >  --  136 139 136 132*  K  --    < >  --  4.3 4.7 4.8 4.8  CL  --    < >  --  112* 115* 113* 107  CO2  --    < >  --  19* 19* 20* 16*  GLUCOSE  --    < >  --  184* 216* 170* 367*  BUN  --    < >  --  23* 19 20 21*  CREATININE  --    < >  --  1.23*  1.06* 0.90 0.80  CALCIUM   --    < >  --  8.1* 8.2* 8.4* 8.7*  GFRNONAA  --    < >  --  57* >60 >60 >60  PROT 5.8*   < > 6.2* 6.7 5.5* 6.3*  --   ALBUMIN 2.8*   < > 3.1* 3.3* 2.7* 3.1*  --   AST 32   < > 19 21 19  14*  --   ALT 28   < > 14 16 12 12   --   ALKPHOS 130*   < > 111 115 82 106  --   BILITOT 2.7*   < > 0.9 1.5* 1.0 1.0  --   BILIDIR 1.3*  --  0.2  --   --   --   --   IBILI 1.4*  --  0.7  --   --   --   --    < > = values in this interval not displayed.    Studies Reviewed:  No results found.  ADDENDUM:  Patient was personally and independently interviewed, examined and relevant elements of the history of present illness were reviewed in details and an assessment and plan was created. All elements of the patient's history of present illness, assessment and plan were discussed in detail with Jacqualin Mate, NP. The above documentation reflects our combined findings assessment and plan.   Briefly 43 year old lady who was evaluated by Dr. Wilhelmenia Harada, hematologist at [facility] for evaluation of anemia, presumed to be from iron  deficiency.  Extensive testing including BCR/ABL 1, JAK2 testing, PNH testing was negative.  However haptoglobin was undetectable on several occasions.  She was referred for bone marrow biopsy for further evaluation of intractable anemia.  Following blood transfusions, platelet count dropped acutely last night to 29,000 and later at 5000 on recheck.  With a concern for autoimmune hemolysis and thrombocytopenia (Evan's syndrome), we recommended starting her on Decadron  40 mg IV or p.o. daily for up to 4 days.  Coombs test came back negative.  PT, PTT, fibrinogen  do not suggest DIC type picture.  LDH elevated at 263.  Haptoglobin pending.  Repeat labs today showed platelet count of 175,000.  Hemoglobin is stable at 9.2.  Less concern for other etiologies like HITT, thrombotic microangiopathy etc.  Recommend continuing Decadron  and switching to oral dose, in case she is  getting discharged.  She will continue to follow-up with Dr. Wilhelmenia Harada upon discharge.  She did undergo bone marrow biopsy earlier today.  Thank you for giving us  the opportunity to participate in her care.  Please call us  with any questions.

## 2023-08-01 NOTE — Care Management Obs Status (Signed)
 MEDICARE OBSERVATION STATUS NOTIFICATION   Patient Details  Name: Melinda Stone MRN: [id number] Date of Birth: 03-27-1980   Medicare Observation Status Notification Given:  Yes    Tessie Fila, RN 08/01/2023, 2:14 PM

## 2023-08-01 NOTE — Progress Notes (Signed)
     Patient Name: Melinda Stone           DOB: December 19, 1980  MRN: [id number]      Admission Date: 07/31/2023  Attending Provider: Bennie Brave, MD  Primary Diagnosis: Symptomatic anemia   Level of care: Telemetry   OVERNIGHT PROGRESS REPORT   Critical lab called in, platelets dropped from 167 --> 29 --> 5 (verified). Has developed severe thrombocytopenia.   Received 1 unit pRBC earlier, hgb from 6.8 --> 7.5 --> 8.3. Per pt, has received 5 other transfusions this month.  Dr. Wilhelmenia Harada (hematology) reached out to ED during admission regarding pts case and concern for transfusion dependent anemia and concern for failure of erythropoiesis. She recommends bone marrow biopsy.    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 Hemodynamically stable.  Has hx of iron  and b12 deficiency, menorrhagia. Last day of menstruation was 5/13.  No signs of active bleeding. No melena, hematochezia, hematemesis, epistaxis. No bleeding from IV site. No new bruising.  Given unclear development of severe thrombocytopenia, I reached out to on- call medical oncologist/ hematoploogy (Dr. Randye Buttner). He recommended starting patient on 40 mg IV decadron , Ok to give 1 unit platelets given plt < 5. Further labs also recommended-  haptoglobin, ldh, ptt, aptt, fibrinogen , coombs test     Bailey Lesser, DNP, ACNPC- AG [facility] [place]

## 2023-08-01 NOTE — Progress Notes (Signed)
 PROGRESS NOTE    BERLYN WESOLOSKI  ZOX:[id number] DOB: 07-30-1980 DOA: 07/31/2023 PCP: Marcial Setting, NP   Brief Narrative:  Melinda Stone is a 43 y.o. female with medical history significant of menorrhagia, profound recurrent episodes of anemia, type 2 diabetes, GERD who presents here with worsening symptomatic anemia with complaints of worsening fatigue and weakness.  Hospitalist called for admission, oncology/hematology called in consult.  Assessment & Plan:   Principal Problem:   Symptomatic anemia Active Problems:   Metabolic acidosis   Menorrhagia   Prolapsed lumbosacral intervertebral disc   HTN (hypertension)   Anxiety   DM (diabetes mellitus) (HCC)   Chlamydia  Symptomatic anemia Questionable thrombocytopenia, cannot rule out lab error -Acute on chronic, recurrent - Appears to be multifactorial in the setting of iron  deficiency with chronic menorrhagia, appears to be transfusion dependent in the outpatient setting already following with gynecology as well as hematology - 1 unit PRBC transfused overnight, hemoglobin improved appropriately, currently 9.2 - Bone marrow biopsy completed, appreciate hematology insight recommendations -Platelets stable over the past few months per chart review(100-300), acutely downtrended overnight (down to 9) without clear etiology and recovered back to baseline after 1 pool of platelets (175) and Decadron - discussed with oncology, this appears to be an inappropriate response to platelets, typically only see a rise of 25 to 50 per pool of platelets-unclear at this time if thrombocytopenia noted overnight is accurate - Will follow for additional 24 hours to ensure no ongoing or worsening anemia/thrombocytopenia  Chlamydia (POA) This is a reportable diagnosis, previously filed outpatient prior to admission.  Continue doxycycline  - initiated prior to admission.   DM (diabetes mellitus) (HCC) Continue with glipizide  and insulin   glargine. Add sliding scale insulin /hypoglycemic protocol   Anxiety On amitriptyline    HTN (hypertension) Continue with metoprolol  and losartan    Prolapsed lumbosacral intervertebral disc Continue home medications including gabapentin , oxycodone /acetaminophen .   Menorrhagia See above, gynecology recommending ablation versus IUD per previous office visit on 07/21/2023    Esophageal reflux/GERD -Continue PPI   DVT prophylaxis: SCDs Start: 07/31/23 1537 Code Status:   Code Status: Full Code Family Communication: At bedside  Status is: Inpatient  Dispo: The patient is from: Home              Anticipated d/c is to: Home              Anticipated d/c date is: 24 to 48 hours              Patient currently not medically stable for discharge  Consultants:  Hematology/oncology  Procedures:  Bone marrow biopsy 08/01/2023  Antimicrobials:  Doxycycline   Subjective: No acute issues or events overnight denies nausea vomiting diarrhea constipation any fevers chills or chest pain  Objective: Vitals:   08/01/23 0002 08/01/23 0035 08/01/23 0259 08/01/23 0403  BP: (!) 142/77 (!) 144/69 (!) 171/86 (!) 173/91  Pulse: 87 80 90 89  Resp: 18 17 19 15   Temp: 98 F (36.7 C) 97.9 F (36.6 C) 98 F (36.7 C) 98.3 F (36.8 C)  TempSrc: Oral Oral Oral   SpO2: 99% 100% 100% 100%  Weight:      Height:        Intake/Output Summary (Last 24 hours) at 08/01/2023 0751 Last data filed at 08/01/2023 0258 Gross per 24 hour  Intake 710 ml  Output --  Net 710 ml   Filed Weights   07/31/23 1051  Weight: 97.1 kg    Examination:  General:  Pleasantly resting  in bed, No acute distress. HEENT:  Normocephalic atraumatic.  Sclerae nonicteric, noninjected.  Extraocular movements intact bilaterally. Neck:  Without mass or deformity.  Trachea is midline. Lungs:  Clear to auscultate bilaterally without rhonchi, wheeze, or rales. Heart:  Regular rate and rhythm.  Without murmurs, rubs, or  gallops. Abdomen:  Soft, nontender, nondistended.  Without guarding or rebound. Extremities: Without cyanosis, clubbing, edema, or obvious deformity. Skin:  Warm and dry, no erythema.  Data Reviewed: I have personally reviewed following labs and imaging studies  CBC: Recent Labs  Lab 07/31/23 0848 07/31/23 1222 07/31/23 1933 07/31/23 2112 08/01/23 0540  WBC 7.0 8.4 6.1 8.3 10.4  NEUTROABS 5.0  --   --  5.4 9.5*  HGB 6.8* 7.5* 8.1* 8.3* 9.2*  HCT 19.9* 22.3* 25.1* 25.3* 27.6*  MCV 81.2 83.8 86.3 85.2 84.1  PLT 129* 167 29* 5* 175   Basic Metabolic Panel: Recent Labs  Lab 07/27/23 1752 07/31/23 1222 08/01/23 0540  NA 139 136 132*  K 4.7 4.8 4.8  CL 115* 113* 107  CO2 19* 20* 16*  GLUCOSE 216* 170* 367*  BUN 19 20 21*  CREATININE 1.06* 0.90 0.80  CALCIUM  8.2* 8.4* 8.7*   GFR: Estimated Creatinine Clearance: 114.8 mL/min (by C-G formula based on SCr of 0.8 mg/dL). Liver Function Tests: Recent Labs  Lab 07/27/23 1752 07/31/23 1222  AST 19 14*  ALT 12 12  ALKPHOS 82 106  BILITOT 1.0 1.0  PROT 5.5* 6.3*  ALBUMIN 2.7* 3.1*   Recent Labs  Lab 07/27/23 1752  LIPASE 40   Coagulation Profile: Recent Labs  Lab 08/01/23 0540  INR 1.0   CBG: Recent Labs  Lab 07/31/23 1543 07/31/23 1629 07/31/23 2057  GLUCAP 75 111* 173*    Recent Results (from the past 240 hours)  Surgical pcr screen     Status: None   Collection Time: 07/22/23  2:01 PM   Specimen: Nasal Mucosa; Nasal Swab  Result Value Ref Range Status   MRSA, PCR NEGATIVE NEGATIVE Final   Staphylococcus aureus NEGATIVE NEGATIVE Final    Comment: (NOTE) The Xpert SA Assay (FDA approved for NASAL specimens in patients 17 years of age and older), is one component of a comprehensive surveillance program. It is not intended to diagnose infection nor to guide or monitor treatment. Performed at [facility], [address] [place], [place] [address]          Radiology Studies: No results  found.      Scheduled Meds:  amitriptyline   25 mg Oral QHS   vitamin B-12  2,500 mcg Oral Daily   dexamethasone  (DECADRON ) injection  40 mg Intravenous Daily   doxycycline   100 mg Oral BID   ferrous sulfate   325 mg Oral Q breakfast   fluticasone  furoate-vilanterol  1 puff Inhalation Daily   furosemide   20 mg Oral q AM   gabapentin   800 mg Oral TID   glipiZIDE   20 mg Oral QAC breakfast   And   glipiZIDE   10 mg Oral QAC supper   insulin  aspart  0-5 Units Subcutaneous QHS   insulin  aspart  0-9 Units Subcutaneous TID WC   insulin  glargine-yfgn  40 Units Subcutaneous Daily   losartan   25 mg Oral Daily   megestrol   40 mg Oral q morning   And   megestrol   80 mg Oral QHS   metoprolol  tartrate  25 mg Oral BID   multivitamin with minerals  1 tablet Oral Daily  pantoprazole   40 mg Oral Daily   sodium chloride  flush  3 mL Intravenous Q12H   Continuous Infusions:   LOS: 0 days   Time spent:  Haydee Lipa, DO [facility]  If 7PM-7AM, please contact night-coverage [email or web address]  08/01/2023, 7:51 AM

## 2023-08-02 ENCOUNTER — Other Ambulatory Visit (HOSPITAL_COMMUNITY): Payer: Self-pay

## 2023-08-02 ENCOUNTER — Encounter: Payer: Self-pay | Admitting: Oncology

## 2023-08-02 DIAGNOSIS — D649 Anemia, unspecified: Secondary | ICD-10-CM | POA: Diagnosis not present

## 2023-08-02 LAB — GLUCOSE, CAPILLARY
Glucose-Capillary: 221 mg/dL — ABNORMAL HIGH (ref 70–99)
Glucose-Capillary: 289 mg/dL — ABNORMAL HIGH (ref 70–99)

## 2023-08-02 LAB — PREPARE PLATELET PHERESIS: Unit division: 0

## 2023-08-02 LAB — BPAM PLATELET PHERESIS
Blood Product Expiration Date: 202505152359
ISSUE DATE / TIME: 202505150015
Unit Type and Rh: 202505152359
Unit Type and Rh: 6200

## 2023-08-02 LAB — CBC
HCT: 24 % — ABNORMAL LOW (ref 36.0–46.0)
Hemoglobin: 8 g/dL — ABNORMAL LOW (ref 12.0–15.0)
MCH: 28 pg (ref 26.0–34.0)
MCHC: 33.3 g/dL (ref 30.0–36.0)
MCV: 83.9 fL (ref 80.0–100.0)
Platelets: 163 10*3/uL (ref 150–400)
RBC: 2.86 MIL/uL — ABNORMAL LOW (ref 3.87–5.11)
RDW: 16.2 % — ABNORMAL HIGH (ref 11.5–15.5)
WBC: 9.1 10*3/uL (ref 4.0–10.5)
nRBC: 0 % (ref 0.0–0.2)

## 2023-08-02 LAB — HEMOGLOBIN AND HEMATOCRIT, BLOOD
HCT: 24.6 % — ABNORMAL LOW (ref 36.0–46.0)
Hemoglobin: 8.1 g/dL — ABNORMAL LOW (ref 12.0–15.0)

## 2023-08-02 LAB — HAPTOGLOBIN: Haptoglobin: <10 mg/dL — ABNORMAL LOW (ref 42–296)

## 2023-08-02 MED ORDER — PREDNISONE 20 MG PO TABS
60.0000 mg | ORAL_TABLET | Freq: Every day | ORAL | 0 refills | Status: AC
  Filled 2023-08-02: qty 6, 2d supply, fill #0

## 2023-08-02 NOTE — Plan of Care (Signed)
 Labs reviewed.  Platelet count stable at 163,000.  Hemoglobin overall stable at 8.  No additional recommendations from our standpoint.  Continue Decadron  to complete the course as previously suggested.  She will follow-up with Dr. Wilhelmenia Harada in [facility] for continued hematology care.   Please call with any questions.

## 2023-08-02 NOTE — Inpatient Diabetes Management (Signed)
 Inpatient Diabetes Program Recommendations  AACE/ADA: New Consensus Statement on Inpatient Glycemic Control (2015)  Target Ranges:  Prepandial:   less than 140 mg/dL      Peak postprandial:   less than 180 mg/dL (1-2 hours)      Critically ill patients:  140 - 180 mg/dL   Lab Results  Component Value Date   GLUCAP 221 (H) 08/02/2023   HGBA1C 4.6 (L) 07/22/2023    Review of Glycemic Control  Latest Reference Range & Units 08/01/23 08:06 08/01/23 10:43 08/01/23 11:40 08/01/23 16:16 08/01/23 20:53 08/02/23 07:49  Glucose-Capillary 70 - 99 mg/dL 161 (H) 096 (H) 045 (H) 366 (H) 246 (H) 221 (H)  (H): Data is abnormally high  Diabetes history: DM2 Outpatient Diabetes medications:  Lantus  40 units every day Glipizide  20 mg QAM, 10 mg QPM Current orders for Inpatient glycemic control:  Semglee  40 units every day Novolog  0-9 units TID and 0-5 units at bedtime Decadron  40 mg QD  Inpatient Diabetes Program Recommendations:    Glucose trends elevated with steroids.  Please consider:  Novolog  0-15 units TID Novolog  5 units TID with meals Discontinue Glipizide  while inpatient  Will continue to follow while inpatient.  Thank you, Hays Lipschutz, MSN, CDCES Diabetes Coordinator Inpatient Diabetes Program [phone] (team pager from 8a-5p)

## 2023-08-02 NOTE — Discharge Summary (Signed)
 Physician Discharge Summary  Melinda Stone WUX:[id number] DOB: [birth year] DOA: 07/31/2023  PCP: Marcial Setting, NP  Admit date: 07/31/2023 Discharge date: 08/02/2023  Admitted From: Home Disposition: Home  Recommendations for Outpatient Follow-up:  Follow up with PCP in 1-2 weeks Follow-up with oncology as scheduled for biopsy results  Home Health: None Equipment/Devices: None  Discharge Condition: Stable CODE STATUS: Full Diet recommendation: As tolerated  Brief/Interim Summary: Melinda Stone is a 43 y.o. female with medical history significant of menorrhagia, profound recurrent episodes of anemia, type 2 diabetes, GERD who presents here with worsening symptomatic anemia with complaints of worsening fatigue and weakness.  Hospitalist called for admission, oncology/hematology called in consult.  Patient admitted for acute on chronic symptomatic anemia which appears to be transfusion dependent multifactorial in the setting of iron  deficiency, blood loss anemia with ongoing menorrhagia as well as questionable production issue.  As such bone marrow biopsy was obtained during this hospitalization for further evaluation by her outpatient oncologist Dr. Wilhelmenia Harada.  At this time given patient's stable hemoglobin no signs or symptoms of bleeding she is otherwise stable and agreeable for discharge home per discussion with oncology.  Of note patient had transiently decreased platelet count over the initial 12 hours of hospitalization.  This is inconsistent with her history and patient's platelets returned back to baseline without any substantial support other than 1 pooled platelets which would not explain the rise of platelet count by nearly 170.  Would recommend outpatient monitoring of CBC to ensure no other lab abnormalities, discussed with oncology and may be clumping versus lab error given her rapid improvement.  Discharge Diagnoses:  Principal Problem:   Symptomatic anemia Active  Problems:   Metabolic acidosis   Menorrhagia   Prolapsed lumbosacral intervertebral disc   HTN (hypertension)   Anxiety   DM (diabetes mellitus) (HCC)   Chlamydia   Thrombocytopenia (HCC)    Discharge Instructions  Discharge Instructions     Call MD for:  difficulty breathing, headache or visual disturbances   Complete by: As directed    Call MD for:  extreme fatigue   Complete by: As directed    Call MD for:  hives   Complete by: As directed    Call MD for:  persistant dizziness or light-headedness   Complete by: As directed    Call MD for:  persistant nausea and vomiting   Complete by: As directed    Call MD for:  severe uncontrolled pain   Complete by: As directed    Call MD for:  temperature >100.4   Complete by: As directed    Diet - low sodium heart healthy   Complete by: As directed    Discharge instructions   Complete by: As directed    Follow-up with oncology as discussed, if any signs or symptoms of bleeding, worsening fatigue, easy bruising or other symptoms to report back to the hospital immediately for further evaluation.   Increase activity slowly   Complete by: As directed       Allergies as of 08/02/2023       Reactions   Trulicity  [dulaglutide ] Diarrhea        Medication List     TAKE these medications    acetaminophen  500 MG tablet Commonly known as: TYLENOL  Take 1,000 mg by mouth every 6 (six) hours as needed for mild pain (pain score 1-3) or moderate pain (pain score 4-6).   amitriptyline  25 MG tablet Commonly known as: ELAVIL  Take 1 tablet (25 mg  total) by mouth at bedtime.   Dexcom G7 Sensor Misc 1 Device by Does not apply route as directed. Change sensor every 10 days What changed:  how to take this when to take this   doxycycline  100 MG capsule Commonly known as: VIBRAMYCIN  Take 1 capsule (100 mg total) by mouth 2 (two) times daily.   Dulera  100-5 MCG/ACT Aero Generic drug: mometasone -formoterol  Inhale 2 puffs into the  lungs 2 (two) times daily at 10 AM and 5 PM.   FeroSul 325 (65 Fe) MG tablet Generic drug: ferrous sulfate  Take 1 tablet (325 mg total) by mouth daily with breakfast.   furosemide  20 MG tablet Commonly known as: LASIX  Take 1 tablet (20 mg total) by mouth in the morning.   gabapentin  800 MG tablet Commonly known as: NEURONTIN  Take 800 mg by mouth 3 (three) times daily.   glipiZIDE  10 MG tablet Commonly known as: GLUCOTROL  Take 2 tablets (20 mg total) by mouth daily before breakfast AND 1 tablet (10 mg total) daily before supper.   Lantus  SoloStar 100 UNIT/ML Solostar Pen Generic drug: insulin  glargine Inject 40 Units into the skin daily before breakfast.   losartan  25 MG tablet Commonly known as: COZAAR  Take 1 tablet (25 mg total) by mouth daily.   megestrol  40 MG tablet Commonly known as: MEGACE  Take one by mouth in the morning and two at night   metoprolol  tartrate 25 MG tablet Commonly known as: LOPRESSOR  Take 1 tablet (25 mg total) by mouth 2 (two) times daily.   multivitamin with minerals Tabs tablet Take 1 tablet by mouth daily.   oxyCODONE -acetaminophen  5-325 MG tablet Commonly known as: PERCOCET/ROXICET Take 1 tablet by mouth every 6 (six) hours as needed for moderate pain (pain score 4-6) or severe pain (pain score 7-10).   pantoprazole  40 MG tablet Commonly known as: PROTONIX  Take 1 tablet (40 mg total) by mouth daily.   predniSONE  20 MG tablet Commonly known as: DELTASONE  Take 3 tablets (60 mg total) by mouth daily for 2 days. What changed:  medication strength how much to take   tiZANidine  4 MG tablet Commonly known as: ZANAFLEX  Take 4 mg by mouth every 12 (twelve) hours as needed for muscle spasms.   Ventolin  HFA 108 (90 Base) MCG/ACT inhaler Generic drug: albuterol  Inhale 2 puffs into the lungs every 6 (six) hours as needed for wheezing or shortness of breath.   Vitamin B-12 2500 MCG Subl Place 1 tablet (2,500 mcg total) under the tongue  daily.   vitamin C  250 MG tablet Commonly known as: ASCORBIC ACID  Take 2 tablets (500 mg total) by mouth daily. What changed: how much to take        Allergies  Allergen Reactions   Trulicity  [Dulaglutide ] Diarrhea    Consultations: Oncology  Procedures/Studies: IR BONE MARROW BIOPSY & ASPIRATION Result Date: 08/01/2023 INDICATION: 43 year old with normocytic anemia and thrombocytopenia. Request for bone marrow biopsy. EXAM: FLUOROSCOPIC GUIDED BONE MARROW ASPIRATES AND BIOPSY Physician: Olive Better. Henn, MD MEDICATIONS: Moderate sedation ANESTHESIA/SEDATION: Moderate (conscious) sedation was employed during this procedure. A total of Versed  4 mg and fentanyl  150 mcg was administered intravenously at the order of the provider performing the procedure. Total intra-service moderate sedation time:  . Patient's level of consciousness and vital signs were monitored continuously by radiology nurse throughout the procedure under the supervision of the provider performing the procedure. FLUOROSCOPY: Radiation Exposure Index (as provided by the fluoroscopic device): 31 mGy Kerma COMPLICATIONS: None immediate. PROCEDURE: The procedure was explained  to the patient. The risks and benefits of the procedure were discussed and the patient's questions were addressed. Informed consent was obtained from the patient. The patient was placed prone on interventional table. The back was prepped and draped in sterile fashion. Maximal barrier sterile technique was utilized including caps, mask, sterile gowns, sterile gloves, sterile drape, hand hygiene and skin antiseptic. The skin and right posterior ilium were anesthetized with 1% lidocaine . 11 gauge bone needle was directed into the right ilium with fluoroscopic guidance. Two aspirates and 1 core biopsy were obtained. Bandage placed over the puncture site. Fluoroscopic image saved for documentation. IMPRESSION: Fluoroscopic guided bone marrow aspiration and core  biopsy. Electronically Signed   By: Elene Griffes M.D.   On: 08/01/2023 12:23     Subjective: No acute issues or events overnight   Discharge Exam: Vitals:   08/02/23 0915 08/02/23 1400  BP:  (!) 141/84  Pulse:  89  Resp:  18  Temp:  97.7 F (36.5 C)  SpO2: 99% 100%   Vitals:   08/01/23 1911 08/02/23 0435 08/02/23 0915 08/02/23 1400  BP: 138/66 (!) 132/59  (!) 141/84  Pulse: 89 86  89  Resp: 15 20  18   Temp: 99 F (37.2 C) 98.2 F (36.8 C)  97.7 F (36.5 C)  TempSrc:    Oral  SpO2: 100% 100% 99% 100%  Weight:      Height:        General: Pt is alert, awake, not in acute distress Cardiovascular: RRR, S1/S2 +, no rubs, no gallops Respiratory: CTA bilaterally, no wheezing, no rhonchi Abdominal: Soft, NT, ND, bowel sounds + Extremities: no edema, no cyanosis    The results of significant diagnostics from this hospitalization (including imaging, microbiology, ancillary and laboratory) are listed below for reference.     Microbiology: Recent Results (from the past 240 hours)  Culture, blood (Routine X 2) w Reflex to ID Panel     Status: None (Preliminary result)   Collection Time: 08/01/23  5:40 AM   Specimen: BLOOD RIGHT ARM  Result Value Ref Range Status   Specimen Description   Final    BLOOD RIGHT ARM Performed at [facility], [address], [place], [place] [address]    Special Requests   Final    BOTTLES DRAWN AEROBIC ONLY Blood Culture results may not be optimal due to an inadequate volume of blood received in culture bottles Performed at [facility], [address], [place], [place] [address]    Culture   Final    NO GROWTH 1 DAY Performed at [facility], [address] [place], [place] [address]    Report Status PENDING  Incomplete  Culture, blood (Routine X 2) w Reflex to ID Panel     Status: None (Preliminary result)   Collection Time: 08/01/23  5:40 AM   Specimen: BLOOD RIGHT HAND  Result Value Ref Range Status    Specimen Description   Final    BLOOD RIGHT HAND Performed at [facility], [address] [place], [place] [address]    Special Requests   Final    BOTTLES DRAWN AEROBIC ONLY Blood Culture results may not be optimal due to an inadequate volume of blood received in culture bottles Performed at [facility], [address], [place], [place] [address]    Culture   Final    NO GROWTH 1 DAY Performed at [facility], [address], [place], [place] [address]    Report Status PENDING  Incomplete     Labs: BNP (last 3 results) Recent Labs    04/21/23 1147 05/11/23 2028  BNP 238.5* 1,082.1*   Basic Metabolic Panel: Recent Labs  Lab 07/27/23 1752 07/31/23 1222 08/01/23 0540 08/01/23 0904  NA 139 136 132* 134*  K 4.7 4.8 4.8 4.9  CL 115* 113* 107 110  CO2 19* 20* 16* 18*  GLUCOSE 216* 170* 367* 423*  BUN 19 20 21* 23*  CREATININE 1.06* 0.90 0.80 0.89  CALCIUM  8.2* 8.4* 8.7* 8.9   Liver Function Tests: Recent Labs  Lab 07/27/23 1752 07/31/23 1222  AST 19 14*  ALT 12 12  ALKPHOS 82 106  BILITOT 1.0 1.0  PROT 5.5* 6.3*  ALBUMIN 2.7* 3.1*   Recent Labs  Lab 07/27/23 1752  LIPASE 40   CBC: Recent Labs  Lab 07/31/23 0848 07/31/23 1222 07/31/23 1933 07/31/23 2112 08/01/23 0540 08/02/23 1013 08/02/23 1521  WBC 7.0 8.4 6.1 8.3 10.4 9.1  --   NEUTROABS 5.0  --   --  5.4 9.5*  --   --   HGB 6.8* 7.5* 8.1* 8.3* 9.2* 8.0* 8.1*  HCT 19.9* 22.3* 25.1* 25.3* 27.6* 24.0* 24.6*  MCV 81.2 83.8 86.3 85.2 84.1 83.9  --   PLT 129* 167 29* 5* 175 163  --    CBG: Recent Labs  Lab 08/01/23 1140 08/01/23 1616 08/01/23 2053 08/02/23 0749 08/02/23 1209  GLUCAP 365* 366* 246* 221* 289*   Urinalysis    Component Value Date/Time   COLORURINE AMBER (A) 05/11/2023 0612   APPEARANCEUR HAZY (A) 05/11/2023 0612   LABSPEC 1.015 05/11/2023 0612   PHURINE 5.0 05/11/2023 0612   GLUCOSEU 50 (A) 05/11/2023 0612   HGBUR LARGE (A) 05/11/2023 0612    BILIRUBINUR negative 07/06/2023 1033   KETONESUR negative 07/06/2023 1033   KETONESUR NEGATIVE 05/11/2023 0612   PROTEINUR >=300 (A) 07/06/2023 1033   PROTEINUR >=300 (A) 05/11/2023 0612   UROBILINOGEN 0.2 07/06/2023 1033   NITRITE Negative 07/06/2023 1033   NITRITE NEGATIVE 05/11/2023 0612   LEUKOCYTESUR Small (1+) (A) 07/06/2023 1033   LEUKOCYTESUR NEGATIVE 05/11/2023 0612   Sepsis Labs Recent Labs  Lab 07/31/23 1933 07/31/23 2112 08/01/23 0540 08/02/23 1013  WBC 6.1 8.3 10.4 9.1   Microbiology Recent Results (from the past 240 hours)  Culture, blood (Routine X 2) w Reflex to ID Panel     Status: None (Preliminary result)   Collection Time: 08/01/23  5:40 AM   Specimen: BLOOD RIGHT ARM  Result Value Ref Range Status   Specimen Description   Final    BLOOD RIGHT ARM Performed at [facility], [address] [place], [place] [address]    Special Requests   Final    BOTTLES DRAWN AEROBIC ONLY Blood Culture results may not be optimal due to an inadequate volume of blood received in culture bottles Performed at [facility], [address], [place], [place] [address]    Culture   Final    NO GROWTH 1 DAY Performed at [facility], [address] [place], [place] [address]    Report Status PENDING  Incomplete  Culture, blood (Routine X 2) w Reflex to ID Panel     Status: None (Preliminary result)   Collection Time: 08/01/23  5:40 AM   Specimen: BLOOD RIGHT HAND  Result Value Ref Range Status   Specimen Description   Final  BLOOD RIGHT HAND Performed at [facility], [address], [place], [place] [address]    Special Requests   Final    BOTTLES DRAWN AEROBIC ONLY Blood Culture results may not be optimal due to an inadequate volume of blood received in culture bottles Performed at [facility], [address] [place], [place] [address]    Culture   Final    NO GROWTH 1 DAY Performed at [facility], [address], [place], [place] [address]    Report Status PENDING  Incomplete     Time coordinating discharge: Over 30 minutes  SIGNED:   Haydee Lipa, DO [facility] 08/02/2023, 3:58 PM Pager   If 7PM-7AM, please contact night-coverage [email or web address]

## 2023-08-05 LAB — SURGICAL PATHOLOGY

## 2023-08-06 ENCOUNTER — Telehealth: Payer: Self-pay

## 2023-08-06 ENCOUNTER — Ambulatory Visit

## 2023-08-06 DIAGNOSIS — D649 Anemia, unspecified: Secondary | ICD-10-CM

## 2023-08-06 LAB — CULTURE, BLOOD (ROUTINE X 2)
Culture: NO GROWTH
Culture: NO GROWTH

## 2023-08-06 NOTE — Telephone Encounter (Signed)
 Dr Wilhelmenia Harada would like pt scheduled for this Friday Lab/ MD. Please schedule and notify pt of a appt details.

## 2023-08-07 NOTE — Telephone Encounter (Addendum)
 Please schedule labs tomorrow morning. She will need to wait for cbc to result. I will check with same day tomorrow to see if they can do blood if needed. We want to make sure she gets blood if needed before she goes out of town.   Please schedule:   Labs tomorrow (cbc, hold tube)  After 5/29 when she returns: lab/MD D1, poss blood D2

## 2023-08-08 ENCOUNTER — Inpatient Hospital Stay

## 2023-08-08 ENCOUNTER — Encounter: Payer: Self-pay | Admitting: Oncology

## 2023-08-08 ENCOUNTER — Encounter (HOSPITAL_COMMUNITY): Payer: Self-pay | Admitting: Emergency Medicine

## 2023-08-08 ENCOUNTER — Other Ambulatory Visit: Payer: Self-pay

## 2023-08-08 ENCOUNTER — Emergency Department (HOSPITAL_COMMUNITY)
Admission: EM | Admit: 2023-08-08 | Discharge: 2023-08-08 | Disposition: A | Discharge status: Home / Self Care | Attending: Emergency Medicine | Admitting: Emergency Medicine

## 2023-08-08 ENCOUNTER — Telehealth: Payer: Self-pay

## 2023-08-08 DIAGNOSIS — Z87891 Personal history of nicotine dependence: Secondary | ICD-10-CM | POA: Diagnosis not present

## 2023-08-08 DIAGNOSIS — Z794 Long term (current) use of insulin: Secondary | ICD-10-CM | POA: Insufficient documentation

## 2023-08-08 DIAGNOSIS — D649 Anemia, unspecified: Secondary | ICD-10-CM

## 2023-08-08 DIAGNOSIS — R160 Hepatomegaly, not elsewhere classified: Secondary | ICD-10-CM | POA: Diagnosis not present

## 2023-08-08 DIAGNOSIS — D509 Iron deficiency anemia, unspecified: Secondary | ICD-10-CM | POA: Diagnosis present

## 2023-08-08 LAB — COMPREHENSIVE METABOLIC PANEL WITH GFR
ALT: 13 U/L (ref 0–44)
AST: 15 U/L (ref 15–41)
Albumin: 3.2 g/dL — ABNORMAL LOW (ref 3.5–5.0)
Alkaline Phosphatase: 94 U/L (ref 38–126)
Anion gap: 6 (ref 5–15)
BUN: 22 mg/dL — ABNORMAL HIGH (ref 6–20)
CO2: 19 mmol/L — ABNORMAL LOW (ref 22–32)
Calcium: 8.5 mg/dL — ABNORMAL LOW (ref 8.9–10.3)
Chloride: 112 mmol/L — ABNORMAL HIGH (ref 98–111)
Creatinine, Ser: 1.14 mg/dL — ABNORMAL HIGH (ref 0.44–1.00)
GFR, Estimated: >60 mL/min (ref >60)
Glucose, Bld: 186 mg/dL — ABNORMAL HIGH (ref 70–99)
Potassium: 4.7 mmol/L (ref 3.5–5.1)
Sodium: 137 mmol/L (ref 135–145)
Total Bilirubin: 1.1 mg/dL (ref 0.0–1.2)
Total Protein: 6.3 g/dL — ABNORMAL LOW (ref 6.5–8.1)

## 2023-08-08 LAB — CBC WITH DIFFERENTIAL (CANCER CENTER ONLY)
Abs Immature Granulocytes: 0.03 10*3/uL (ref 0.00–0.07)
Basophils Absolute: 0.1 10*3/uL (ref 0.0–0.1)
Basophils Relative: 1 %
Eosinophils Absolute: 0.2 10*3/uL (ref 0.0–0.5)
Eosinophils Relative: 3 %
HCT: 22.3 % — ABNORMAL LOW (ref 36.0–46.0)
Hemoglobin: 7.8 g/dL — ABNORMAL LOW (ref 12.0–15.0)
Immature Granulocytes: 0 %
Lymphocytes Relative: 22 %
Lymphs Abs: 1.6 10*3/uL (ref 0.7–4.0)
MCH: 27.8 pg (ref 26.0–34.0)
MCHC: 35 g/dL (ref 30.0–36.0)
MCV: 79.4 fL — ABNORMAL LOW (ref 80.0–100.0)
Monocytes Absolute: 0.4 10*3/uL (ref 0.1–1.0)
Monocytes Relative: 5 %
Neutro Abs: 5 10*3/uL (ref 1.7–7.7)
Neutrophils Relative %: 69 %
Platelet Count: 131 10*3/uL — ABNORMAL LOW (ref 150–400)
RBC: 2.81 MIL/uL — ABNORMAL LOW (ref 3.87–5.11)
RDW: 15.7 % — ABNORMAL HIGH (ref 11.5–15.5)
WBC Count: 7.3 10*3/uL (ref 4.0–10.5)
nRBC: 0 % (ref 0.0–0.2)

## 2023-08-08 LAB — CBC
HCT: 23.2 % — ABNORMAL LOW (ref 36.0–46.0)
Hemoglobin: 8 g/dL — ABNORMAL LOW (ref 12.0–15.0)
MCH: 28.4 pg (ref 26.0–34.0)
MCHC: 34.5 g/dL (ref 30.0–36.0)
MCV: 82.3 fL (ref 80.0–100.0)
Platelets: 150 10*3/uL (ref 150–400)
RBC: 2.82 MIL/uL — ABNORMAL LOW (ref 3.87–5.11)
RDW: 15.7 % — ABNORMAL HIGH (ref 11.5–15.5)
WBC: 9.3 10*3/uL (ref 4.0–10.5)
nRBC: 0 % (ref 0.0–0.2)

## 2023-08-08 LAB — CBG MONITORING, ED: Glucose-Capillary: 176 mg/dL — ABNORMAL HIGH (ref 70–99)

## 2023-08-08 LAB — TYPE AND SCREEN
ABO/RH(D): A POS
Antibody Screen: NEGATIVE

## 2023-08-08 LAB — HCG, SERUM, QUALITATIVE: Preg, Serum: NEGATIVE

## 2023-08-08 NOTE — ED Provider Triage Note (Signed)
 Emergency Medicine Provider Triage Evaluation Note  Melinda Stone , a 43 y.o. female  was evaluated in triage.  Pt complains of low hemoglobin.  Hx of recent biopsy for anemia evaluation   Review of Systems  Positive: weakness Negative: Short of breath  Physical Exam  BP (!) 145/82   Pulse 95   Temp 98.8 F (37.1 C)   Resp 16   Ht 5\' 9"  (1.753 m)   Wt 97.5 kg   LMP 07/19/2023 (Exact Date)   SpO2 96%   BMI 31.75 kg/m  Gen:   Awake, no distress   Resp:  Normal effort  MSK:   Moves extremities without difficulty  Other:    Medical Decision Making  Medically screening exam initiated at 1:47 PM.  Appropriate orders placed.  Melinda Stone was informed that the remainder of the evaluation will be completed by another provider, this initial triage assessment does not replace that evaluation, and the importance of remaining in the ED until their evaluation is complete.     Sandi Crosby, PA-C 08/08/23 1348

## 2023-08-08 NOTE — Telephone Encounter (Signed)
 Hemoglobin 7.8 today. Arranged for blood transfusion at [facility] and called pt, but she said she will just go to [facility] cone for blood.

## 2023-08-08 NOTE — ED Provider Notes (Signed)
 [place] EMERGENCY DEPARTMENT AT [facility] Provider Note   CSN: [id number] Arrival date & time: 08/08/23  1302     History  Chief Complaint  Patient presents with   Abnormal Lab    Melinda Stone is a 43 y.o. female with history of multifactorial suspected iron  deficiency anemia presenting to ED with concern for low blood count.  Patient was recently hospitalized for anemia and a few other medical problems, with her anemia felt to be secondary to poor bone marrow production combined with menorrhagia Melinda Stone, lifelong problem.  She reports that she has very long menses lasting up to 13 days most recently but she is off her menses.  She had outpatient blood testing done today and she came in because she was told that her "hemoglobin was 6."  She reports that she does have a mild headache, denies any worsening shortness of breath or dizziness or fatigue.  Denies any ongoing vaginal bleeding.  She is following with oncology at this time/hematology for her anemia.  Per my review of medical records her hemoglobin earlier today was 8.0, with baseline levels 7-8 for the past 2 months  HPI     Home Medications Prior to Admission medications   Medication Sig Start Date End Date Taking? Authorizing Provider  acetaminophen  (TYLENOL ) 500 MG tablet Take 1,000 mg by mouth every 6 (six) hours as needed for mild pain (pain score 1-3) or moderate pain (pain score 4-6).    [provider]  albuterol  (VENTOLIN  HFA) 108 (90 Base) MCG/ACT inhaler Inhale 2 puffs into the lungs every 6 (six) hours as needed for wheezing or shortness of breath. 05/29/23   Setzer, Sandra J, PA-C  amitriptyline  (ELAVIL ) 25 MG tablet Take 1 tablet (25 mg total) by mouth at bedtime. 01/31/23   Wess Hammed, NP  Continuous Glucose Sensor (DEXCOM G7 SENSOR) MISC 1 Device by Does not apply route as directed. Change sensor every 10 days Patient taking differently: Inject 1 Device into the skin See admin  instructions. Change sensor every 10 days 06/21/23   Shamleffer, Ibtehal Jaralla, MD  Cyanocobalamin  (VITAMIN B-12) 2500 MCG SUBL Place 1 tablet (2,500 mcg total) under the tongue daily. 04/23/23   Timmy Forbes, MD  doxycycline  (VIBRAMYCIN ) 100 MG capsule Take 1 capsule (100 mg total) by mouth 2 (two) times daily. 07/21/23   Auston Blush, MD  ferrous sulfate  325 (65 FE) MG tablet Take 1 tablet (325 mg total) by mouth daily with breakfast. 05/29/23   Setzer, Hamp Levine, PA-C  furosemide  (LASIX ) 20 MG tablet Take 1 tablet (20 mg total) by mouth in the morning. 07/24/23   Tolia, Sunit, DO  gabapentin  (NEURONTIN ) 800 MG tablet Take 800 mg by mouth 3 (three) times daily.    [provider]  glipiZIDE  (GLUCOTROL ) 10 MG tablet Take 2 tablets (20 mg total) by mouth daily before breakfast AND 1 tablet (10 mg total) daily before supper. 06/21/23   Shamleffer, Ibtehal Jaralla, MD  insulin  glargine (LANTUS  SOLOSTAR) 100 UNIT/ML Solostar Pen Inject 40 Units into the skin daily before breakfast. 06/21/23   Shamleffer, Ibtehal Jaralla, MD  losartan  (COZAAR ) 25 MG tablet Take 1 tablet (25 mg total) by mouth daily. 07/16/23   Tolia, Sunit, DO  megestrol  (MEGACE ) 40 MG tablet Take one by mouth in the morning and two at night 07/30/23   Tresia Fruit, MD  metoprolol  tartrate (LOPRESSOR ) 25 MG tablet Take 1 tablet (25 mg total) by mouth 2 (two) times daily.  05/29/23 07/31/23  Setzer, Sandra J, PA-C  mometasone -formoterol  (DULERA ) 100-5 MCG/ACT AERO Inhale 2 puffs into the lungs 2 (two) times daily at 10 AM and 5 PM. 05/29/23   Setzer, Sandra J, PA-C  Multiple Vitamin (MULTIVITAMIN WITH MINERALS) TABS tablet Take 1 tablet by mouth daily. 05/29/23   Setzer, Sandra J, PA-C  oxyCODONE -acetaminophen  (PERCOCET/ROXICET) 5-325 MG tablet Take 1 tablet by mouth every 6 (six) hours as needed for moderate pain (pain score 4-6) or severe pain (pain score 7-10).    [provider]  pantoprazole  (PROTONIX ) 40 MG tablet Take 1 tablet (40 mg  total) by mouth daily. 05/29/23   Setzer, Sandra J, PA-C  tiZANidine  (ZANAFLEX ) 4 MG tablet Take 4 mg by mouth every 12 (twelve) hours as needed for muscle spasms. 06/10/23   [provider]  vitamin C  (ASCORBIC ACID ) 250 MG tablet Take 2 tablets (500 mg total) by mouth daily. Patient taking differently: Take 250 mg by mouth daily. 04/10/22   Timmy Forbes, MD      Allergies    Trulicity  [dulaglutide ]    Review of Systems   Review of Systems  Physical Exam Updated Vital Signs BP 102/85   Pulse 87   Temp 98.8 F (37.1 C)   Resp 16   Ht 5\' 9"  (1.753 m)   Wt 97.5 kg   LMP 07/19/2023 (Exact Date)   SpO2 100%   BMI 31.75 kg/m  Physical Exam Constitutional:      General: She is not in acute distress. HENT:     Head: Normocephalic and atraumatic.  Eyes:     Conjunctiva/sclera: Conjunctivae normal.     Pupils: Pupils are equal, round, and reactive to light.  Cardiovascular:     Rate and Rhythm: Normal rate and regular rhythm.  Pulmonary:     Effort: Pulmonary effort is normal. No respiratory distress.  Abdominal:     General: There is no distension.     Tenderness: There is no abdominal tenderness.  Skin:    General: Skin is warm and dry.  Neurological:     General: No focal deficit present.     Mental Status: She is alert. Mental status is at baseline.  Psychiatric:        Mood and Affect: Mood normal.        Behavior: Behavior normal.     ED Results / Procedures / Treatments   Labs (all labs ordered are listed, but only abnormal results are displayed) Labs Reviewed  COMPREHENSIVE METABOLIC PANEL WITH GFR - Abnormal; Notable for the following components:      Result Value   Chloride 112 (*)    CO2 19 (*)    Glucose, Bld 186 (*)    BUN 22 (*)    Creatinine, Ser 1.14 (*)    Calcium  8.5 (*)    Total Protein 6.3 (*)    Albumin 3.2 (*)    All other components within normal limits  CBC - Abnormal; Notable for the following components:   RBC 2.82 (*)     Hemoglobin 8.0 (*)    HCT 23.2 (*)    RDW 15.7 (*)    All other components within normal limits  CBG MONITORING, ED - Abnormal; Notable for the following components:   Glucose-Capillary 176 (*)    All other components within normal limits  URINALYSIS, ROUTINE W REFLEX MICROSCOPIC  HCG, SERUM, QUALITATIVE  CBG MONITORING, ED  TYPE AND SCREEN    EKG None  Radiology No results  found.  Procedures Procedures    Medications Ordered in ED Medications - No data to display  ED Course/ Medical Decision Making/ A&P                                 Medical Decision Making Amount and/or Complexity of Data Reviewed Labs: ordered.   The patient is here with concern for anemia.  There appears to be some confusion about her understanding of her hemoglobin level.  She thought it was down to 6.  In fact it was 7.8.  On her recheck here today it is 8.0, which is within that margin of error.  I do not see evidence of acutely worsening anemia.  I do not see indication for blood transfusion and the patient does not want to stay in the ED to have this done regardless, would prefer to follow-up as an outpatient for monitoring.  There is no further workup required at this time.  I reviewed her external records including recent hospitalization course and workup for anemia.  I also reviewed her outpatient labs from earlier today.  Her hemoglobin remained stable and near baseline levels.  Minor increase in Cr, other labs stable        Final Clinical Impression(s) / ED Diagnoses Final diagnoses:  Anemia, unspecified type    Rx / DC Orders ED Discharge Orders     None         Darlynn Ricco, Janalyn Me, MD 08/08/23 1534

## 2023-08-08 NOTE — Discharge Instructions (Signed)
 Your hemoglobin level today was 8.0, which is near your recent baseline levels.  There is no emergency need for blood transfusion in the emergency department, and you preferred to follow-up as an outpatient for this issue.  Please continue to monitor your symptoms at home if you have worsening dizziness, lightheadedness, difficulty breathing or loss of consciousness.  Otherwise reach out to the hematology or blood specialist who can help arrange for outpatient testing and treatment.

## 2023-08-08 NOTE — ED Triage Notes (Signed)
 Patient presents due to low hemoglobin. She is experiencing lightheadedness. Her MD told her to come here.

## 2023-08-09 LAB — SAMPLE TO BLOOD BANK

## 2023-08-13 ENCOUNTER — Other Ambulatory Visit (HOSPITAL_COMMUNITY): Payer: Self-pay

## 2023-08-13 ENCOUNTER — Ambulatory Visit: Admitting: Obstetrics & Gynecology

## 2023-08-14 ENCOUNTER — Encounter (HOSPITAL_COMMUNITY): Payer: Self-pay

## 2023-08-14 ENCOUNTER — Ambulatory Visit: Payer: Self-pay | Admitting: Oncology

## 2023-08-15 ENCOUNTER — Encounter: Payer: Self-pay | Admitting: Oncology

## 2023-08-15 NOTE — Telephone Encounter (Signed)
 Email sent to Susie Erichsen at [facility] bone marrow lab for testing addon.

## 2023-08-15 NOTE — Telephone Encounter (Signed)
-----   Message from Timmy Forbes sent at 08/14/2023 10:45 PM EDT ----- Please add on myeloid NGS thanks.

## 2023-08-19 ENCOUNTER — Encounter: Payer: Self-pay | Admitting: Obstetrics and Gynecology

## 2023-08-19 ENCOUNTER — Other Ambulatory Visit (HOSPITAL_COMMUNITY)
Admission: RE | Admit: 2023-08-19 | Discharge: 2023-08-19 | Disposition: A | Discharge status: Home / Self Care | Source: Ambulatory Visit | Attending: Obstetrics and Gynecology | Admitting: Obstetrics and Gynecology

## 2023-08-19 ENCOUNTER — Ambulatory Visit (INDEPENDENT_AMBULATORY_CARE_PROVIDER_SITE_OTHER): Admitting: Obstetrics and Gynecology

## 2023-08-19 VITALS — BP 119/74 | HR 85 | Wt 213.0 lb

## 2023-08-19 DIAGNOSIS — N939 Abnormal uterine and vaginal bleeding, unspecified: Secondary | ICD-10-CM | POA: Insufficient documentation

## 2023-08-19 DIAGNOSIS — Z3202 Encounter for pregnancy test, result negative: Secondary | ICD-10-CM | POA: Diagnosis not present

## 2023-08-19 LAB — POCT URINE PREGNANCY: Preg Test, Ur: NEGATIVE

## 2023-08-19 NOTE — Progress Notes (Signed)
 Patient: Melinda Stone Date of Birth: 1980-06-01  Reason for Visit: Follow up History from: Patient Primary Neurologist: Penumalli   ASSESSMENT AND PLAN 43 y.o. year old female   1.  Chronic migraine headache with aura  2.  Chronic pain syndrome  - Restart Emgality  120 mg monthly injection for migraine prevention - Try Nurtec 75 mg as needed for acute migraine, Take 1 tablet at onset of headache, max is 1 tablet in 24 hours.  - Urine pregnancy test was negative yesterday, had tubes tied, but still has menstrual cycle, pregnancy not advised until all CGRP for 6 months -Previously tried and failed: Amitriptyline , Topamax , Maxalt , gabapentin , Percocet, tizanidine , Imitrex , Nurtec -Next steps: Ajovy, Qulipta, Botox -For now, we will continue amitriptyline , it does help with her sleeping, would like to consider weaning off once established on CGRP -Follow-up in 6 months or sooner if needed  Meds ordered this encounter  Medications   Galcanezumab -gnlm (EMGALITY ) 120 MG/ML SOAJ    Sig: Inject 120 mg into the skin every 30 (thirty) days.    Dispense:  1.12 mL    Refill:  11   Rimegepant Sulfate (NURTEC) 75 MG TBDP    Sig: Take 1 tablet (75 mg total) by mouth as needed. Take 1 tablet at onset of headache, max is 1 tablet in 24 hours.    Dispense:  8 tablet    Refill:  11   HISTORY OF PRESENT ILLNESS: Today 08/20/23 Struggling with anemia, has been getting blood infusions/hospital admission, last HCG 8.0 08/08/23. She did the loading dose of Emgality , hard to say if difference, because had headaches when she is anemic. Hasn't had Emgality  since Feb, told on back order. Headaches get better with blood infusions. The Ubrelvy  made her nauseated, she went to sleep. Hard to say headache frequency, maybe 3 times a week moderate-severe headache, left sided neck area, temples, makes her nauseated. Remains on amitriptyline  25 mg at bedtime. Takes Percocet daily for back pain. Had negative  urine pregnancy test yesterday.   Update 01/31/23 SS: Never got Emgality  reports due to being out of stock. Migraines 3-4 a week. Tried Imitrex , was helpful but her vomit. Remains on amitritpyline 25 mg at bedtime, helps her sleep, unclear benefit to migraines?  Continues to see pain management for low back pain determine if any surgical options are available.  07/11/22 SS: Had MRI of the brain with and without contrast October 2023 was unremarkable. Amitriptyline  started last visit 25 mg at bedtime. No change in headaches, but it helps her sleep. Still having 2-3 a week. Are frontal, occipital. Migraine features. Takes excedrin  migraine and tylenol , doesn't help. Tried Maxalt  in the past without benefit. Chronic back pain on oxycodone . Hospitalized for PNA in Feb 2024. Supposed to have fusion in lower back, reports orthopedics won't operate due to low iron  levels, ESI caused more pain than help. Get a 2nd opinion, continue going to pain management. Trying for awhile to get disability, going through appeal.   HISTORY  UPDATE (06/20/21, VRP): Since last visit, now with migraine x 2 months. Similar HA in her 20's. Gen throbbing, nausea, sens to light and sound HA. Now 3-4 per week.   PRIOR HPI (11/07/20): 43 year old female with type 2 diabetes and chronic low back pain here for evaluation of lower extremity pain and numbness.   Patient has had diabetes since age 27 years old.  She has had lower extremity numbness and tingling for past 3 to 4 years.  She is  also been diagnosed with lumbar spinal stenosis and degenerative changes, status post surgery in March 2022.  Follow-up postoperative imaging demonstrates residual spinal stenosis and radiculopathy changes and she is planning to have additional surgery in the next few weeks.   Patient has been under care of pain management in the past.  She has tried gabapentin , Lyrica , Cymbalta , Robaxin , hydrocodone  and tramadol  in the past  REVIEW OF SYSTEMS: Out of a  complete 14 system review of symptoms, the patient complains only of the following symptoms, and all other reviewed systems are negative.  See HPI  ALLERGIES: Allergies  Allergen Reactions   Trulicity  [Dulaglutide ] Diarrhea    HOME MEDICATIONS: Outpatient Medications Prior to Visit  Medication Sig Dispense Refill   acetaminophen  (TYLENOL ) 500 MG tablet Take 1,000 mg by mouth every 6 (six) hours as needed for mild pain (pain score 1-3) or moderate pain (pain score 4-6).     albuterol  (VENTOLIN  HFA) 108 (90 Base) MCG/ACT inhaler Inhale 2 puffs into the lungs every 6 (six) hours as needed for wheezing or shortness of breath. 18 g 2   amitriptyline  (ELAVIL ) 25 MG tablet Take 1 tablet (25 mg total) by mouth at bedtime. 30 tablet 6   Continuous Glucose Sensor (DEXCOM G7 SENSOR) MISC 1 Device by Does not apply route as directed. Change sensor every 10 days (Patient taking differently: Inject 1 Device into the skin See admin instructions. Change sensor every 10 days) 9 each 3   Cyanocobalamin  (VITAMIN B-12) 2500 MCG SUBL Place 1 tablet (2,500 mcg total) under the tongue daily. 30 tablet 2   doxycycline  (VIBRAMYCIN ) 100 MG capsule Take 1 capsule (100 mg total) by mouth 2 (two) times daily. 20 capsule 0   ferrous sulfate  325 (65 FE) MG tablet Take 1 tablet (325 mg total) by mouth daily with breakfast. 30 tablet 0   furosemide  (LASIX ) 20 MG tablet Take 1 tablet (20 mg total) by mouth in the morning. 30 tablet 3   gabapentin  (NEURONTIN ) 800 MG tablet Take 800 mg by mouth 3 (three) times daily.     glipiZIDE  (GLUCOTROL ) 10 MG tablet Take 2 tablets (20 mg total) by mouth daily before breakfast AND 1 tablet (10 mg total) daily before supper. 270 tablet 3   insulin  glargine (LANTUS  SOLOSTAR) 100 UNIT/ML Solostar Pen Inject 40 Units into the skin daily before breakfast. 45 mL 2   losartan  (COZAAR ) 25 MG tablet Take 1 tablet (25 mg total) by mouth daily. 30 tablet 3   megestrol  (MEGACE ) 40 MG tablet Take one  by mouth in the morning and two at night 90 tablet 3   mometasone -formoterol  (DULERA ) 100-5 MCG/ACT AERO Inhale 2 puffs into the lungs 2 (two) times daily at 10 AM and 5 PM. 13 g 0   Multiple Vitamin (MULTIVITAMIN WITH MINERALS) TABS tablet Take 1 tablet by mouth daily.     oxyCODONE -acetaminophen  (PERCOCET/ROXICET) 5-325 MG tablet Take 1 tablet by mouth every 6 (six) hours as needed for moderate pain (pain score 4-6) or severe pain (pain score 7-10).     pantoprazole  (PROTONIX ) 40 MG tablet Take 1 tablet (40 mg total) by mouth daily. 30 tablet 0   tiZANidine  (ZANAFLEX ) 4 MG tablet Take 4 mg by mouth every 12 (twelve) hours as needed for muscle spasms.     vitamin C  (ASCORBIC ACID ) 250 MG tablet Take 2 tablets (500 mg total) by mouth daily. (Patient taking differently: Take 250 mg by mouth daily.) 180 tablet 0   metoprolol  tartrate (  LOPRESSOR ) 25 MG tablet Take 1 tablet (25 mg total) by mouth 2 (two) times daily. 60 tablet 0   No facility-administered medications prior to visit.    PAST MEDICAL HISTORY: Past Medical History:  Diagnosis Date   Anemia    Arthritis 08/17/2020   Chronic back pain    Diabetes mellitus without complication (HCC)    type 2   GERD (gastroesophageal reflux disease)    Migraine    Ovarian cyst    Pneumonia    Polyneuropathy     PAST SURGICAL HISTORY: Past Surgical History:  Procedure Laterality Date   CESAREAN SECTION     CHOLECYSTECTOMY  2004   EYE SURGERY Left    cataract   IR BONE MARROW BIOPSY & ASPIRATION  08/01/2023   LUMBAR LAMINECTOMY N/A 05/30/2020   Procedure: Lumbar five Laminectomy,  Bilateral Microdiscectomy, Left Lumbar five -Sacral one Microdiscectomy;  Surgeon: Adah Acron, MD;  Location: [facility] OR;  Service: Orthopedics;  Laterality: N/A;   LUMBAR LAMINECTOMY Left 11/14/2020   Procedure: LEFT LUMBAR FOUR-FIVE MICRODISCECTOMY;  Surgeon: Adah Acron, MD;  Location: [facility] OR;  Service: Orthopedics;  Laterality: Left;   TUBAL LIGATION   10/19/2010    FAMILY HISTORY: Family History  Problem Relation Age of Onset   Diabetes Mother    Hypertension Mother    Stroke Mother     SOCIAL HISTORY: Social History   Socioeconomic History   Marital status: Single    Spouse name: Not on file   Number of children: 3   Years of education: Not on file   Highest education level: High school graduate  Occupational History    Comment: home maker  Tobacco Use   Smoking status: Former    Types: Cigars    Quit date: 12/23/2021    Years since quitting: 1.6   Smokeless tobacco: Never   Tobacco comments:    2 cigars daily. Khj 02/12/2023  Vaping Use   Vaping status: Never Used  Substance and Sexual Activity   Alcohol  use: Not Currently   Drug use: Not Currently    Comment: per patient stopped smoking marijuana Mar 2022   Sexual activity: Yes    Birth control/protection: Surgical  Other Topics Concern   Not on file  Social History Narrative   Lives with 3 children   Some coffee   Right handed   Doesn't work   Social Drivers of Corporate investment banker Strain: Not on file  Food Insecurity: No Food Insecurity (07/31/2023)   Hunger Vital Sign    Worried About Running Out of Food in the Last Year: Never true    Ran Out of Food in the Last Year: Never true  Transportation Needs: No Transportation Needs (07/31/2023)   PRAPARE - Administrator, Civil Service (Medical): No    Lack of Transportation (Non-Medical): No  Physical Activity: Not on file  Stress: Not on file  Social Connections: Moderately Isolated (07/31/2023)   Social Connection and Isolation Panel [NHANES]    Frequency of Communication with Friends and Family: Twice a week    Frequency of Social Gatherings with Friends and Family: Twice a week    Attends Religious Services: 1 to 4 times per year    Active Member of [employer] or Organizations: No    Attends Banker Meetings: Never    Marital Status: Never married  Intimate Partner  Violence: Not At Risk (07/31/2023)   Humiliation, Afraid, Rape, and Kick questionnaire  Fear of Current or Ex-Partner: No    Emotionally Abused: No    Physically Abused: No    Sexually Abused: No   PHYSICAL EXAM  Vitals:   08/20/23 1001  BP: 126/68  Weight: 219 lb (99.3 kg)  Height: 5\' 9"  (1.753 m)   Body mass index is 32.34 kg/m.  Generalized: Well developed, in no acute distress, disheveled, tired  Neurological examination  Mentation: Alert oriented to time, place, history taking. Follows all commands speech and language fluent Cranial nerve II-XII: Pupils were equal round reactive to light. Extraocular movements were full, visual field were full on confrontational test. Facial sensation and strength were normal.  Head turning and shoulder shrug  were normal and symmetric. Motor: The motor testing reveals 5 over 5 strength of all extremities, exception 4/5 left hip flexion Sensory: Sensory testing is intact to soft touch on all 4 extremities. No evidence of extinction is noted.  Coordination: Cerebellar testing reveals good finger-nose-finger and heel-to-shin bilaterally.  Gait and station: Independent but slow, antalgic   DIAGNOSTIC DATA (LABS, IMAGING, TESTING) - I reviewed patient records, labs, notes, testing and imaging myself where available.  Lab Results  Component Value Date   WBC 9.3 08/08/2023   HGB 8.0 (L) 08/08/2023   HCT 23.2 (L) 08/08/2023   MCV 82.3 08/08/2023   PLT 150 08/08/2023      Component Value Date/Time   NA 137 08/08/2023 1437   NA 137 07/23/2023 1200   K 4.7 08/08/2023 1437   CL 112 (H) 08/08/2023 1437   CO2 19 (L) 08/08/2023 1437   GLUCOSE 186 (H) 08/08/2023 1437   BUN 22 (H) 08/08/2023 1437   BUN 23 07/23/2023 1200   CREATININE 1.14 (H) 08/08/2023 1437   CALCIUM  8.5 (L) 08/08/2023 1437   PROT 6.3 (L) 08/08/2023 1437   PROT 6.3 04/28/2020 1124   ALBUMIN 3.2 (L) 08/08/2023 1437   ALBUMIN 4.0 04/28/2020 1124   AST 15 08/08/2023 1437    ALT 13 08/08/2023 1437   ALKPHOS 94 08/08/2023 1437   BILITOT 1.1 08/08/2023 1437   BILITOT 1.7 (H) 04/28/2020 1124   GFRNONAA >60 08/08/2023 1437   GFRAA 139 04/28/2020 1124   Lab Results  Component Value Date   CHOL 96 (L) 04/28/2020   HDL 55 04/28/2020   LDLCALC 23 04/28/2020   TRIG 138 05/16/2023   CHOLHDL 1.7 04/28/2020   Lab Results  Component Value Date   HGBA1C 4.6 (L) 07/22/2023   Lab Results  Component Value Date   VITAMINB12 604 06/11/2023   Lab Results  Component Value Date   TSH 0.655 05/11/2023    Jeanmarie Millet, AGNP-C, DNP 08/20/2023, 10:06 AM [facility] [address], [address] [place], [place] [address] [phone]

## 2023-08-19 NOTE — Progress Notes (Signed)
 43 yo P3 with LMP 07/19/23 here to follow up on AUB. Patient was seen last month following a prolonged episode of vaginal bleeding. She was seen in the ED and treated with premarin  and outpatient megace . Patient was counseled on levonorgestrel IUD or endometrial ablation as management option. Patient opted for ablation and is here for endometrial biopsy. Patient reports feeling well with a 3-day episode of vaginal bleeding last week. She denies pelvic pain or abnormal discharge  Past Medical History:  Diagnosis Date   Anemia    Arthritis 08/17/2020   Chronic back pain    Diabetes mellitus without complication (HCC)    type 2   GERD (gastroesophageal reflux disease)    Migraine    Ovarian cyst    Pneumonia    Polyneuropathy    Past Surgical History:  Procedure Laterality Date   CESAREAN SECTION     CHOLECYSTECTOMY  2004   EYE SURGERY Left    cataract   IR BONE MARROW BIOPSY & ASPIRATION  08/01/2023   LUMBAR LAMINECTOMY N/A 05/30/2020   Procedure: Lumbar five Laminectomy,  Bilateral Microdiscectomy, Left Lumbar five -Sacral one Microdiscectomy;  Surgeon: Adah Acron, MD;  Location: [facility] OR;  Service: Orthopedics;  Laterality: N/A;   LUMBAR LAMINECTOMY Left 11/14/2020   Procedure: LEFT LUMBAR FOUR-FIVE MICRODISCECTOMY;  Surgeon: Adah Acron, MD;  Location: [facility] OR;  Service: Orthopedics;  Laterality: Left;   TUBAL LIGATION  10/19/2010   Family History  Problem Relation Age of Onset   Diabetes Mother    Hypertension Mother    Stroke Mother    Social History   Tobacco Use   Smoking status: Former    Types: Cigars    Quit date: 12/23/2021    Years since quitting: 1.6   Smokeless tobacco: Never   Tobacco comments:    2 cigars daily. Khj 02/12/2023  Vaping Use   Vaping status: Never Used  Substance Use Topics   Alcohol  use: Not Currently   Drug use: Not Currently    Comment: per patient stopped smoking marijuana Mar 2022   ROS See pertinent in HPI. All other systems reviewed  and non contributory Blood pressure 119/74, pulse 85, weight 213 lb (96.6 kg), last menstrual period 07/19/2023. GENERAL: Well-developed, well-nourished female in no acute distress.  ABDOMEN: Soft, nontender, nondistended. No organomegaly. PELVIC: Normal external female genitalia. Vagina is pink and rugated.  Normal discharge. Normal appearing cervix. Uterus is normal in size. No adnexal mass or tenderness. Chaperone present during the pelvic exam EXTREMITIES: No cyanosis, clubbing, or edema, 2+ distal pulses.  A/P 43 yo here for endometrial biopsy in preparation for endometrial ablation - ENDOMETRIAL BIOPSY     The indications for endometrial biopsy were reviewed.   Risks of the biopsy including cramping, bleeding, infection, uterine perforation, inadequate specimen and need for additional procedures  were discussed. The patient states she understands and agrees to undergo procedure today. Consent was signed. Time out was performed. Urine HCG was negative. A sterile speculum was placed in the patient's vagina and the cervix was prepped with Betadine. A single-toothed tenaculum was placed on the anterior lip of the cervix to stabilize it. The uterine cavity was sounded to a depth of 9 cm using the uterine sound. The 3 mm pipelle was introduced into the endometrial cavity without difficulty, 2 passes were made.  A  moderate amount of tissue was  sent to pathology. The instruments were removed from the patient's vagina. Minimal bleeding from the cervix was  noted. The patient tolerated the procedure well.  Routine post-procedure instructions were given to the patient. The patient will follow up in two weeks to review the results and for further management.   - Risks, benefits and alternative to endometrial ablation were reviewed including but not limited to risks of bleeding, infection, uterine perforation and damage to adjacent organs. Patient verbalized understanding and all questions were answered

## 2023-08-19 NOTE — Progress Notes (Signed)
 Pt is in office to discuss ablation and proceed to schedule. Pt states bleeding is still abnormal, has 3 days of bleeding last week.

## 2023-08-20 ENCOUNTER — Encounter: Payer: Self-pay | Admitting: Neurology

## 2023-08-20 ENCOUNTER — Ambulatory Visit (INDEPENDENT_AMBULATORY_CARE_PROVIDER_SITE_OTHER): Admitting: Neurology

## 2023-08-20 ENCOUNTER — Encounter: Payer: Self-pay | Admitting: Oncology

## 2023-08-20 VITALS — BP 126/68 | Ht 69.0 in | Wt 219.0 lb

## 2023-08-20 DIAGNOSIS — G43109 Migraine with aura, not intractable, without status migrainosus: Secondary | ICD-10-CM

## 2023-08-20 MED ORDER — NURTEC 75 MG PO TBDP: 75.0000 mg | ORAL_TABLET | ORAL | 11 refills | Status: DC | PRN

## 2023-08-20 MED ORDER — EMGALITY 120 MG/ML ~~LOC~~ SOAJ: 120.0000 mg | SUBCUTANEOUS | 11 refills | Status: DC

## 2023-08-20 NOTE — Patient Instructions (Signed)
 Restart Emgality  once monthly for migraine prevention  Try nurtec 1 tablet at onset of headache Take 1 tablet at onset of headache, max is 1 tablet in 24 hours Follow up in 6 months

## 2023-08-21 ENCOUNTER — Ambulatory Visit: Payer: Self-pay | Admitting: Obstetrics and Gynecology

## 2023-08-21 LAB — SURGICAL PATHOLOGY

## 2023-08-22 LAB — HM DIABETES EYE EXAM

## 2023-08-26 ENCOUNTER — Inpatient Hospital Stay (HOSPITAL_BASED_OUTPATIENT_CLINIC_OR_DEPARTMENT_OTHER): Admitting: Oncology

## 2023-08-26 ENCOUNTER — Encounter (HOSPITAL_COMMUNITY): Payer: Self-pay | Admitting: Oncology

## 2023-08-26 ENCOUNTER — Inpatient Hospital Stay: Attending: Oncology

## 2023-08-26 ENCOUNTER — Encounter: Payer: Self-pay | Admitting: Oncology

## 2023-08-26 ENCOUNTER — Other Ambulatory Visit: Payer: Self-pay | Admitting: Oncology

## 2023-08-26 VITALS — BP 144/69 | HR 91 | Temp 97.1°F | Resp 18 | Wt 216.0 lb

## 2023-08-26 DIAGNOSIS — E538 Deficiency of other specified B group vitamins: Secondary | ICD-10-CM | POA: Diagnosis not present

## 2023-08-26 DIAGNOSIS — R160 Hepatomegaly, not elsewhere classified: Secondary | ICD-10-CM

## 2023-08-26 DIAGNOSIS — Z87891 Personal history of nicotine dependence: Secondary | ICD-10-CM | POA: Diagnosis not present

## 2023-08-26 DIAGNOSIS — D509 Iron deficiency anemia, unspecified: Secondary | ICD-10-CM | POA: Diagnosis present

## 2023-08-26 DIAGNOSIS — D519 Vitamin B12 deficiency anemia, unspecified: Secondary | ICD-10-CM

## 2023-08-26 DIAGNOSIS — D696 Thrombocytopenia, unspecified: Secondary | ICD-10-CM | POA: Insufficient documentation

## 2023-08-26 DIAGNOSIS — D649 Anemia, unspecified: Secondary | ICD-10-CM

## 2023-08-26 DIAGNOSIS — D508 Other iron deficiency anemias: Secondary | ICD-10-CM | POA: Diagnosis not present

## 2023-08-26 DIAGNOSIS — R162 Hepatomegaly with splenomegaly, not elsewhere classified: Secondary | ICD-10-CM | POA: Insufficient documentation

## 2023-08-26 LAB — CBC WITH DIFFERENTIAL (CANCER CENTER ONLY)
Abs Immature Granulocytes: 0.02 10*3/uL (ref 0.00–0.07)
Basophils Absolute: 0.1 10*3/uL (ref 0.0–0.1)
Basophils Relative: 1 %
Eosinophils Absolute: 0.1 10*3/uL (ref 0.0–0.5)
Eosinophils Relative: 2 %
HCT: 24.5 % — ABNORMAL LOW (ref 36.0–46.0)
Hemoglobin: 8.4 g/dL — ABNORMAL LOW (ref 12.0–15.0)
Immature Granulocytes: 0 %
Lymphocytes Relative: 21 %
Lymphs Abs: 1.4 10*3/uL (ref 0.7–4.0)
MCH: 28.9 pg (ref 26.0–34.0)
MCHC: 34.3 g/dL (ref 30.0–36.0)
MCV: 84.2 fL (ref 80.0–100.0)
Monocytes Absolute: 0.4 10*3/uL (ref 0.1–1.0)
Monocytes Relative: 6 %
Neutro Abs: 4.8 10*3/uL (ref 1.7–7.7)
Neutrophils Relative %: 70 %
Platelet Count: 136 10*3/uL — ABNORMAL LOW (ref 150–400)
RBC: 2.91 MIL/uL — ABNORMAL LOW (ref 3.87–5.11)
RDW: 17.4 % — ABNORMAL HIGH (ref 11.5–15.5)
WBC Count: 6.9 10*3/uL (ref 4.0–10.5)
nRBC: 0 % (ref 0.0–0.2)

## 2023-08-26 LAB — C-REACTIVE PROTEIN: CRP: 0.7 mg/dL (ref <1.0)

## 2023-08-26 LAB — RETIC PANEL
Immature Retic Fract: 5.8 % (ref 2.3–15.9)
RBC.: 2.85 MIL/uL — ABNORMAL LOW (ref 3.87–5.11)
Retic Count, Absolute: 244.5 10*3/uL — ABNORMAL HIGH (ref 19.0–186.0)
Retic Ct Pct: 8.6 % — ABNORMAL HIGH (ref 0.4–3.1)
Reticulocyte Hemoglobin: 31.5 pg (ref >27.9)

## 2023-08-26 LAB — SAMPLE TO BLOOD BANK

## 2023-08-26 LAB — SEDIMENTATION RATE: Sed Rate: 38 mm/h — ABNORMAL HIGH (ref 0–20)

## 2023-08-26 NOTE — Progress Notes (Signed)
 Hematology/Oncology Progress note [phone] [phone]     Patient Care Team: Marcial Setting, NP as PCP - General (Nurse Practitioner) Olinda Bertrand, DO as PCP - Cardiology (Cardiology) Timmy Forbes, MD as Consulting Physician (Oncology)  ASSESSMENT & PLAN:   IDA (iron  deficiency anemia) Lab Results  Component Value Date   HGB 8.4 (L) 08/26/2023   TIBC 353 07/23/2023   IRONPCTSAT 26 07/23/2023   FERRITIN 206 07/23/2023    Adequate iron  level.   Hepatomegaly Possibly due to fatty liver disease. Negative hepatitis B and C Negative CMV HIV parvovirus Negative BCR ABL1 FISH, JAK2 mutation.  Low serum vitamin B12 S/p vitamin B12 injections, she did not tolerate due to muscle cramps.  B12 level stable continue sublingual B12 1000mcg daily.    Normocytic anemia Refractory to blood transfusion.  low haptoglobin, normal Ldh, normal bilirubin.  no M protein on myeloma panel, negative parvo virus DNA PCR, CMV ? Ineffective erythropoiesis vs hemolysis. negative DAT,  normal haptoglobin, negative PNH Bone marrow biopsy showed Slightly hypercellular bone marrow with erythroid hyperplasia, No viral cytopathic changes or significant dyspoiesis. Suspect secondary process. Normal cytogenetics, normal myeloid NGS  Hb has improved without blood transfusion for 3 weeks.  Close monitor. Check ANA, CRP ESR.    Orders Placed This Encounter  Procedures   CMP (Cancer Center only)    Standing Status:   Future    Expected Date:   10/26/2023    Expiration Date:   08/25/2024   CBC with Differential (Cancer Center Only)    Standing Status:   Future    Expected Date:   10/26/2023    Expiration Date:   08/25/2024   Retic Panel    Standing Status:   Future    Expected Date:   10/26/2023    Expiration Date:   08/25/2024   CBC with Differential (Cancer Center Only)    Standing Status:   Future    Expected Date:   09/23/2023    Expiration Date:   08/25/2024   Retic Panel    Standing  Status:   Future    Expected Date:   09/23/2023    Expiration Date:   08/25/2024   CBC with Differential (Cancer Center Only)    Standing Status:   Future    Expected Date:   09/09/2023    Expiration Date:   08/25/2024   Retic Panel    Standing Status:   Future    Expected Date:   09/09/2023    Expiration Date:   08/25/2024   Sample to Blood Bank    Standing Status:   Future    Expected Date:   10/26/2023    Expiration Date:   08/25/2024   Sample to Blood Bank    Standing Status:   Future    Expected Date:   09/23/2023    Expiration Date:   08/25/2024   Sample to Blood Bank    Standing Status:   Future    Expected Date:   09/09/2023    Expiration Date:   08/25/2024    Follow-up per LOS  All questions were answered. The patient knows to call the clinic with any problems, questions or concerns.  Timmy Forbes, MD, PhD [facility] Hematology Oncology 08/26/2023   CHIEF COMPLAINTS/REASON FOR VISIT:  iron  deficiency anemia  HISTORY OF PRESENTING ILLNESS:   Melinda Stone is a  43 y.o.  female presents for iron  deficiency anemia.  Patient had blood work done on 07/13/2021, CBC  showed hemoglobin of 9.0, MCV 75.3, ferritin 14, iron  saturation 21, TIBC 455.  Reviewed her previous lab records.  Patient has chronic anemia since at least 2019.  + Nausea, no vomiting.  Chronic diarrhea after each meal.  Unintentional weight loss Patient reports that she has had colonoscopy done in May which did not reveal etiology of diarrhea.  Colonoscopy record was not available to me at the time of dictation. 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  05/11/2023 - 05/23/2023 Patient was hospitalized due to hypoxic respiratory failure, influenza A, ARDS, pneumonia and she was discharged to rehab after hospitalization.  INTERVAL HISTORY Margo L Pelle is a 43 y.o. female who has above history reviewed by me today presents for follow up visit for iron  deficiency  anemia Patient takes oral vitamin B12 supplementation at 1000 mcg daily. Chronic fatigue chronic diarrhea, unchanged. No fever, chills, unintentional weight loss, night sweats.  Denies hematochezia, hematuria, hematemesis, epistaxis, black tarry stool or easy bruising.  Endometrial biopsy negative for malignancy.   severe pain in her back and the bottom of her feet,   Review of Systems  Constitutional:  Positive for fatigue. Negative for chills and fever.  HENT:   Negative for hearing loss and voice change.   Eyes:  Negative for eye problems.  Respiratory:  Negative for chest tightness and cough.   Cardiovascular:  Negative for chest pain.  Gastrointestinal:  Negative for abdominal distention, abdominal pain, blood in stool, diarrhea, nausea and vomiting.  Endocrine: Negative for hot flashes.  Genitourinary:  Negative for difficulty urinating and frequency.   Musculoskeletal:  Positive for back pain. Negative for arthralgias.  Skin:  Negative for itching and rash.  Neurological:  Positive for numbness. Negative for extremity weakness.  Hematological:  Negative for adenopathy.  Psychiatric/Behavioral:  Negative for confusion.     MEDICAL HISTORY:  Past Medical History:  Diagnosis Date   Anemia    Arthritis 08/17/2020   Chronic back pain    Diabetes mellitus without complication (HCC)    type 2   GERD (gastroesophageal reflux disease)    Migraine    Ovarian cyst    Pneumonia    Polyneuropathy     SURGICAL HISTORY: Past Surgical History:  Procedure Laterality Date   CESAREAN SECTION     CHOLECYSTECTOMY  2004   EYE SURGERY Left    cataract   IR BONE MARROW BIOPSY & ASPIRATION  08/01/2023   LUMBAR LAMINECTOMY N/A 05/30/2020   Procedure: Lumbar five Laminectomy,  Bilateral Microdiscectomy, Left Lumbar five -Sacral one Microdiscectomy;  Surgeon: Adah Acron, MD;  Location: [facility] OR;  Service: Orthopedics;  Laterality: N/A;   LUMBAR LAMINECTOMY Left 11/14/2020   Procedure:  LEFT LUMBAR FOUR-FIVE MICRODISCECTOMY;  Surgeon: Adah Acron, MD;  Location: [facility] OR;  Service: Orthopedics;  Laterality: Left;   TUBAL LIGATION  10/19/2010    SOCIAL HISTORY: Social History   Socioeconomic History   Marital status: Single    Spouse name: Not on file   Number of children: 3   Years of education: Not on file   Highest education level: High school graduate  Occupational History    Comment: home maker  Tobacco Use   Smoking status: Former    Types: Cigars    Quit date: 12/23/2021    Years since quitting: 1.6   Smokeless tobacco: Never   Tobacco comments:    2 cigars daily. Khj 02/12/2023  Vaping Use   Vaping status: Never Used  Substance and Sexual Activity   Alcohol  use: Not Currently   Drug use: Not Currently    Comment: per patient stopped smoking marijuana Mar 2022   Sexual activity: Yes    Birth control/protection: Surgical  Other Topics Concern   Not on file  Social History Narrative   Lives with 3 children   Some coffee   Right handed   Doesn't work   Social Drivers of Corporate investment banker Strain: Not on file  Food Insecurity: No Food Insecurity (07/31/2023)   Hunger Vital Sign    Worried About Running Out of Food in the Last Year: Never true    Ran Out of Food in the Last Year: Never true  Transportation Needs: No Transportation Needs (07/31/2023)   PRAPARE - Administrator, Civil Service (Medical): No    Lack of Transportation (Non-Medical): No  Physical Activity: Not on file  Stress: Not on file  Social Connections: Moderately Isolated (07/31/2023)   Social Connection and Isolation Panel [NHANES]    Frequency of Communication with Friends and Family: Twice a week    Frequency of Social Gatherings with Friends and Family: Twice a week    Attends Religious Services: 1 to 4 times per year    Active Member of [employer] or Organizations: No    Attends Banker Meetings: Never    Marital Status: Never married   Intimate Partner Violence: Not At Risk (07/31/2023)   Humiliation, Afraid, Rape, and Kick questionnaire    Fear of Current or Ex-Partner: No    Emotionally Abused: No    Physically Abused: No    Sexually Abused: No    FAMILY HISTORY: Family History  Problem Relation Age of Onset   Diabetes Mother    Hypertension Mother    Stroke Mother     ALLERGIES:  is allergic to trulicity  [dulaglutide ].  MEDICATIONS:  Current Outpatient Medications  Medication Sig Dispense Refill   acetaminophen  (TYLENOL ) 500 MG tablet Take 1,000 mg by mouth every 6 (six) hours as needed for mild pain (pain score 1-3) or moderate pain (pain score 4-6).     albuterol  (VENTOLIN  HFA) 108 (90 Base) MCG/ACT inhaler Inhale 2 puffs into the lungs every 6 (six) hours as needed for wheezing or shortness of breath. 18 g 2   amitriptyline  (ELAVIL ) 25 MG tablet Take 1 tablet (25 mg total) by mouth at bedtime. 30 tablet 6   Continuous Glucose Sensor (DEXCOM G7 SENSOR) MISC 1 Device by Does not apply route as directed. Change sensor every 10 days (Patient taking differently: Inject 1 Device into the skin See admin instructions. Change sensor every 10 days) 9 each 3   Cyanocobalamin  (VITAMIN B-12) 2500 MCG SUBL Place 1 tablet (2,500 mcg total) under the tongue daily. 30 tablet 2   doxycycline  (VIBRAMYCIN ) 100 MG capsule Take 1 capsule (100 mg total) by mouth 2 (two) times daily. 20 capsule 0   ferrous sulfate  325 (65 FE) MG tablet Take 1 tablet (325 mg total) by mouth daily with breakfast. 30 tablet 0   furosemide  (LASIX ) 20 MG tablet Take 1 tablet (20 mg total) by mouth in the morning. 30 tablet 3   gabapentin  (NEURONTIN ) 800 MG tablet Take 800 mg by mouth 3 (three) times daily.     Galcanezumab -gnlm (EMGALITY ) 120 MG/ML SOAJ Inject 120 mg into the skin every 30 (thirty) days. 1.12 mL 11   glipiZIDE  (GLUCOTROL ) 10 MG tablet Take 2 tablets (20 mg total) by mouth  daily before breakfast AND 1 tablet (10 mg total) daily before  supper. 270 tablet 3   insulin  glargine (LANTUS  SOLOSTAR) 100 UNIT/ML Solostar Pen Inject 40 Units into the skin daily before breakfast. 45 mL 2   losartan  (COZAAR ) 25 MG tablet Take 1 tablet (25 mg total) by mouth daily. 30 tablet 3   megestrol  (MEGACE ) 40 MG tablet Take one by mouth in the morning and two at night 90 tablet 3   metoprolol  tartrate (LOPRESSOR ) 25 MG tablet Take 1 tablet (25 mg total) by mouth 2 (two) times daily. 60 tablet 0   mometasone -formoterol  (DULERA ) 100-5 MCG/ACT AERO Inhale 2 puffs into the lungs 2 (two) times daily at 10 AM and 5 PM. 13 g 0   Multiple Vitamin (MULTIVITAMIN WITH MINERALS) TABS tablet Take 1 tablet by mouth daily.     oxyCODONE -acetaminophen  (PERCOCET/ROXICET) 5-325 MG tablet Take 1 tablet by mouth every 6 (six) hours as needed for moderate pain (pain score 4-6) or severe pain (pain score 7-10).     pantoprazole  (PROTONIX ) 40 MG tablet Take 1 tablet (40 mg total) by mouth daily. 30 tablet 0   Rimegepant Sulfate (NURTEC) 75 MG TBDP Take 1 tablet (75 mg total) by mouth as needed. Take 1 tablet at onset of headache, max is 1 tablet in 24 hours. 8 tablet 11   tiZANidine  (ZANAFLEX ) 4 MG tablet Take 4 mg by mouth every 12 (twelve) hours as needed for muscle spasms.     vitamin C  (ASCORBIC ACID ) 250 MG tablet Take 2 tablets (500 mg total) by mouth daily. (Patient taking differently: Take 250 mg by mouth daily.) 180 tablet 0   No current facility-administered medications for this visit.     PHYSICAL EXAMINATION: ECOG PERFORMANCE STATUS: 2 - Symptomatic, <50% confined to bed Vitals:   08/26/23 1026  BP: (!) 144/69  Pulse: 91  Resp: 18  Temp: (!) 97.1 F (36.2 C)  SpO2: 100%   Filed Weights   08/26/23 1026  Weight: 216 lb (98 kg)      Physical Exam Constitutional:      General: She is not in acute distress.    Comments: Patient sits in the wheelchair  HENT:     Head: Normocephalic and atraumatic.  Eyes:     General: No scleral  icterus. Cardiovascular:     Rate and Rhythm: Normal rate and regular rhythm.     Heart sounds: Normal heart sounds.  Pulmonary:     Effort: Pulmonary effort is normal. No respiratory distress.     Breath sounds: No wheezing.  Abdominal:     General: Bowel sounds are normal. There is no distension.     Palpations: Abdomen is soft.  Musculoskeletal:        General: No deformity. Normal range of motion.     Cervical back: Normal range of motion and neck supple.  Skin:    General: Skin is warm and dry.     Coloration: Skin is not pale.     Findings: No erythema or rash.  Neurological:     Mental Status: She is alert and oriented to person, place, and time. Mental status is at baseline.  Psychiatric:        Mood and Affect: Mood normal.     LABORATORY DATA:  I have reviewed the data as listed    Latest Ref Rng & Units 08/26/2023    9:44 AM 08/08/2023    2:37 PM 08/08/2023    8:53 AM  CBC  WBC 4.0 - 10.5 K/uL 6.9  9.3  7.3   Hemoglobin 12.0 - 15.0 g/dL 8.4  8.0  7.8   Hematocrit 36.0 - 46.0 % 24.5  23.2  22.3   Platelets 150 - 400 K/uL 136  150  131       Latest Ref Rng & Units 08/08/2023    2:37 PM 08/01/2023    9:04 AM 08/01/2023    5:40 AM  CMP  Glucose 70 - 99 mg/dL 161  096  045   BUN 6 - 20 mg/dL 22  23  21    Creatinine 0.44 - 1.00 mg/dL 4.09  8.11  9.14   Sodium 135 - 145 mmol/L 137  134  132   Potassium 3.5 - 5.1 mmol/L 4.7  4.9  4.8   Chloride 98 - 111 mmol/L 112  110  107   CO2 22 - 32 mmol/L 19  18  16    Calcium  8.9 - 10.3 mg/dL 8.5  8.9  8.7   Total Protein 6.5 - 8.1 g/dL 6.3     Total Bilirubin 0.0 - 1.2 mg/dL 1.1     Alkaline Phos 38 - 126 U/L 94     AST 15 - 41 U/L 15     ALT 0 - 44 U/L 13        Iron /TIBC/Ferritin/ %Sat    Component Value Date/Time   IRON  90 07/23/2023 1548   IRON  64 01/02/2019 0955   TIBC 353 07/23/2023 1548   TIBC 382 01/02/2019 0955   FERRITIN 206 07/23/2023 1548   FERRITIN 21 01/02/2019 0955   IRONPCTSAT 26 07/23/2023 1548    IRONPCTSAT 17 01/02/2019 0955      RADIOGRAPHIC STUDIES: I have personally reviewed the radiological images as listed and agreed with the findings in the report. IR BONE MARROW BIOPSY & ASPIRATION Result Date: 08/01/2023 INDICATION: 43 year old with normocytic anemia and thrombocytopenia. Request for bone marrow biopsy. EXAM: FLUOROSCOPIC GUIDED BONE MARROW ASPIRATES AND BIOPSY Physician: Olive Better. Henn, MD MEDICATIONS: Moderate sedation ANESTHESIA/SEDATION: Moderate (conscious) sedation was employed during this procedure. A total of Versed  4 mg and fentanyl  150 mcg was administered intravenously at the order of the provider performing the procedure. Total intra-service moderate sedation time:  . Patient's level of consciousness and vital signs were monitored continuously by radiology nurse throughout the procedure under the supervision of the provider performing the procedure. FLUOROSCOPY: Radiation Exposure Index (as provided by the fluoroscopic device): 31 mGy Kerma COMPLICATIONS: None immediate. PROCEDURE: The procedure was explained to the patient. The risks and benefits of the procedure were discussed and the patient's questions were addressed. Informed consent was obtained from the patient. The patient was placed prone on interventional table. The back was prepped and draped in sterile fashion. Maximal barrier sterile technique was utilized including caps, mask, sterile gowns, sterile gloves, sterile drape, hand hygiene and skin antiseptic. The skin and right posterior ilium were anesthetized with 1% lidocaine . 11 gauge bone needle was directed into the right ilium with fluoroscopic guidance. Two aspirates and 1 core biopsy were obtained. Bandage placed over the puncture site. Fluoroscopic image saved for documentation. IMPRESSION: Fluoroscopic guided bone marrow aspiration and core biopsy. Electronically Signed   By: Elene Griffes M.D.   On: 08/01/2023 12:23

## 2023-08-26 NOTE — Assessment & Plan Note (Addendum)
 Refractory to blood transfusion.  low haptoglobin, normal Ldh, normal bilirubin.  no M protein on myeloma panel, negative parvo virus DNA PCR, CMV ? Ineffective erythropoiesis vs hemolysis. negative DAT,  normal haptoglobin, negative PNH Bone marrow biopsy showed Slightly hypercellular bone marrow with erythroid hyperplasia, No viral cytopathic changes or significant dyspoiesis. Suspect secondary process. Normal cytogenetics, normal myeloid NGS  Hb has improved without blood transfusion for 3 weeks.  Close monitor. Check ANA, CRP ESR.

## 2023-08-26 NOTE — Assessment & Plan Note (Signed)
 Lab Results  Component Value Date   HGB 8.4 (L) 08/26/2023   TIBC 353 07/23/2023   IRONPCTSAT 26 07/23/2023   FERRITIN 206 07/23/2023    Adequate iron  level.

## 2023-08-26 NOTE — Assessment & Plan Note (Signed)
 S/p vitamin B12 injections, she did not tolerate due to muscle cramps.  B12 level stable continue sublingual B12 daily.

## 2023-08-26 NOTE — Progress Notes (Signed)
 Patient had an MRI on 08/01/2023. She is having severe pain in her back and the bottom of her feet, she rates her pain at about an 8 today. She is waiting on her insurance company to approve her to get the camera to swallow for her gastrologist. She has no appetite, I gave her some samples of the chocolate ensures, she has never tried before.

## 2023-08-26 NOTE — Assessment & Plan Note (Addendum)
 Possibly due to fatty liver disease. Negative hepatitis B and C Negative CMV HIV parvovirus Negative BCR ABL1 FISH, JAK2 mutation.

## 2023-08-27 ENCOUNTER — Inpatient Hospital Stay

## 2023-08-27 LAB — ANA W/REFLEX: Anti Nuclear Antibody (ANA): NEGATIVE

## 2023-09-02 ENCOUNTER — Telehealth: Payer: Self-pay

## 2023-09-02 NOTE — Telephone Encounter (Signed)
 I called patient to schedule surgery w/ Dr. Dodie Frees on 10/01/23 at 10 am/[facility]. Patient is available and asked that I proceed w/ scheduling. I provided pre-Op instructions and surgery details by phone. Patient confirmed understanding.

## 2023-09-03 ENCOUNTER — Ambulatory Visit: Payer: Medicaid Other | Admitting: Neurology

## 2023-09-05 ENCOUNTER — Observation Stay (HOSPITAL_COMMUNITY)
Admission: EM | Admit: 2023-09-05 | Discharge: 2023-09-06 | Disposition: A | Discharge status: Home / Self Care | Attending: Internal Medicine | Admitting: Internal Medicine

## 2023-09-05 ENCOUNTER — Other Ambulatory Visit: Payer: Self-pay

## 2023-09-05 ENCOUNTER — Encounter (HOSPITAL_COMMUNITY): Payer: Self-pay

## 2023-09-05 DIAGNOSIS — K219 Gastro-esophageal reflux disease without esophagitis: Secondary | ICD-10-CM | POA: Diagnosis present

## 2023-09-05 DIAGNOSIS — D519 Vitamin B12 deficiency anemia, unspecified: Secondary | ICD-10-CM | POA: Diagnosis not present

## 2023-09-05 DIAGNOSIS — E1142 Type 2 diabetes mellitus with diabetic polyneuropathy: Secondary | ICD-10-CM | POA: Diagnosis present

## 2023-09-05 DIAGNOSIS — E538 Deficiency of other specified B group vitamins: Secondary | ICD-10-CM | POA: Diagnosis present

## 2023-09-05 DIAGNOSIS — Z87891 Personal history of nicotine dependence: Secondary | ICD-10-CM | POA: Diagnosis not present

## 2023-09-05 DIAGNOSIS — D649 Anemia, unspecified: Secondary | ICD-10-CM | POA: Diagnosis not present

## 2023-09-05 DIAGNOSIS — E1110 Type 2 diabetes mellitus with ketoacidosis without coma: Secondary | ICD-10-CM | POA: Diagnosis not present

## 2023-09-05 DIAGNOSIS — D638 Anemia in other chronic diseases classified elsewhere: Secondary | ICD-10-CM | POA: Diagnosis present

## 2023-09-05 DIAGNOSIS — Z794 Long term (current) use of insulin: Secondary | ICD-10-CM | POA: Diagnosis not present

## 2023-09-05 DIAGNOSIS — M48061 Spinal stenosis, lumbar region without neurogenic claudication: Secondary | ICD-10-CM | POA: Diagnosis present

## 2023-09-05 DIAGNOSIS — E872 Acidosis, unspecified: Secondary | ICD-10-CM | POA: Diagnosis present

## 2023-09-05 DIAGNOSIS — N179 Acute kidney failure, unspecified: Secondary | ICD-10-CM

## 2023-09-05 DIAGNOSIS — Z79899 Other long term (current) drug therapy: Secondary | ICD-10-CM | POA: Insufficient documentation

## 2023-09-05 DIAGNOSIS — M48062 Spinal stenosis, lumbar region with neurogenic claudication: Secondary | ICD-10-CM | POA: Insufficient documentation

## 2023-09-05 DIAGNOSIS — E119 Type 2 diabetes mellitus without complications: Secondary | ICD-10-CM

## 2023-09-05 DIAGNOSIS — Z7984 Long term (current) use of oral hypoglycemic drugs: Secondary | ICD-10-CM | POA: Diagnosis not present

## 2023-09-05 DIAGNOSIS — I10 Essential (primary) hypertension: Secondary | ICD-10-CM | POA: Diagnosis present

## 2023-09-05 DIAGNOSIS — N390 Urinary tract infection, site not specified: Secondary | ICD-10-CM | POA: Diagnosis not present

## 2023-09-05 DIAGNOSIS — N920 Excessive and frequent menstruation with regular cycle: Secondary | ICD-10-CM | POA: Diagnosis present

## 2023-09-05 DIAGNOSIS — R399 Unspecified symptoms and signs involving the genitourinary system: Secondary | ICD-10-CM

## 2023-09-05 DIAGNOSIS — N939 Abnormal uterine and vaginal bleeding, unspecified: Principal | ICD-10-CM

## 2023-09-05 DIAGNOSIS — R42 Dizziness and giddiness: Secondary | ICD-10-CM | POA: Diagnosis present

## 2023-09-05 LAB — BASIC METABOLIC PANEL WITH GFR
Anion gap: 9 (ref 5–15)
BUN: 25 mg/dL — ABNORMAL HIGH (ref 6–20)
CO2: 15 mmol/L — ABNORMAL LOW (ref 22–32)
Calcium: 8.3 mg/dL — ABNORMAL LOW (ref 8.9–10.3)
Chloride: 111 mmol/L (ref 98–111)
Creatinine, Ser: 1.51 mg/dL — ABNORMAL HIGH (ref 0.44–1.00)
GFR, Estimated: 44 mL/min — ABNORMAL LOW (ref >60)
Glucose, Bld: 225 mg/dL — ABNORMAL HIGH (ref 70–99)
Potassium: 4.1 mmol/L (ref 3.5–5.1)
Sodium: 135 mmol/L (ref 135–145)

## 2023-09-05 LAB — BLOOD GAS, VENOUS
Acid-base deficit: 8.6 mmol/L — ABNORMAL HIGH (ref 0.0–2.0)
Bicarbonate: 17.4 mmol/L — ABNORMAL LOW (ref 20.0–28.0)
O2 Saturation: 47.5 %
Patient temperature: 37
pCO2, Ven: 37 mmHg — ABNORMAL LOW (ref 44–60)
pH, Ven: 7.28 (ref 7.25–7.43)
pO2, Ven: <31 mmHg — CL (ref 32–45)

## 2023-09-05 LAB — HCG, SERUM, QUALITATIVE: Preg, Serum: NEGATIVE

## 2023-09-05 LAB — PREPARE RBC (CROSSMATCH)

## 2023-09-05 LAB — CBC
HCT: 19.9 % — ABNORMAL LOW (ref 36.0–46.0)
Hemoglobin: 6.7 g/dL — CL (ref 12.0–15.0)
MCH: 28.2 pg (ref 26.0–34.0)
MCHC: 33.7 g/dL (ref 30.0–36.0)
MCV: 83.6 fL (ref 80.0–100.0)
Platelets: 158 10*3/uL (ref 150–400)
RBC: 2.38 MIL/uL — ABNORMAL LOW (ref 3.87–5.11)
RDW: 16.4 % — ABNORMAL HIGH (ref 11.5–15.5)
WBC: 8.1 10*3/uL (ref 4.0–10.5)
nRBC: 0 % (ref 0.0–0.2)

## 2023-09-05 LAB — CBG MONITORING, ED: Glucose-Capillary: 166 mg/dL — ABNORMAL HIGH (ref 70–99)

## 2023-09-05 MED ORDER — LACTATED RINGERS IV BOLUS
1000.0000 mL | Freq: Once | INTRAVENOUS | Status: AC
  Administered 2023-09-05: 1000 mL via INTRAVENOUS

## 2023-09-05 MED ORDER — ALBUTEROL SULFATE (2.5 MG/3ML) 0.083% IN NEBU: 2.5000 mg | INHALATION_SOLUTION | Freq: Four times a day (QID) | RESPIRATORY_TRACT | Status: DC | PRN

## 2023-09-05 MED ORDER — FERROUS SULFATE 325 (65 FE) MG PO TABS
325.0000 mg | ORAL_TABLET | Freq: Every day | ORAL | Status: DC
  Administered 2023-09-06: 325 mg via ORAL
  Filled 2023-09-05: qty 1

## 2023-09-05 MED ORDER — OXYCODONE-ACETAMINOPHEN 5-325 MG PO TABS
1.0000 | ORAL_TABLET | Freq: Four times a day (QID) | ORAL | Status: DC | PRN
  Administered 2023-09-05 – 2023-09-06 (×2): 1 via ORAL
  Filled 2023-09-05 (×2): qty 1

## 2023-09-05 MED ORDER — VITAMIN B-12 1000 MCG PO TABS
2500.0000 ug | ORAL_TABLET | Freq: Every day | ORAL | Status: DC
  Administered 2023-09-06: 2500 ug via ORAL
  Filled 2023-09-05: qty 3

## 2023-09-05 MED ORDER — ACETAMINOPHEN 650 MG RE SUPP
650.0000 mg | Freq: Four times a day (QID) | RECTAL | Status: DC | PRN
Start: 2023-09-05 — End: 2023-09-06

## 2023-09-05 MED ORDER — SODIUM CHLORIDE 0.9% IV SOLUTION: Freq: Once | INTRAVENOUS | Status: DC

## 2023-09-05 MED ORDER — INSULIN ASPART 100 UNIT/ML IJ SOLN
0.0000 [IU] | Freq: Three times a day (TID) | INTRAMUSCULAR | Status: DC
  Administered 2023-09-06 (×2): 3 [IU] via SUBCUTANEOUS
  Filled 2023-09-05: qty 0.09

## 2023-09-05 MED ORDER — VITAMIN C 500 MG PO TABS
250.0000 mg | ORAL_TABLET | Freq: Every day | ORAL | Status: DC
  Administered 2023-09-06: 250 mg via ORAL
  Filled 2023-09-05: qty 1

## 2023-09-05 MED ORDER — MEGESTROL ACETATE 40 MG PO TABS
80.0000 mg | ORAL_TABLET | Freq: Every day | ORAL | Status: DC
  Administered 2023-09-05: 80 mg via ORAL
  Filled 2023-09-05: qty 2

## 2023-09-05 MED ORDER — ONDANSETRON HCL 4 MG PO TABS: 4.0000 mg | ORAL_TABLET | Freq: Four times a day (QID) | ORAL | Status: DC | PRN

## 2023-09-05 MED ORDER — METOPROLOL TARTRATE 5 MG/5ML IV SOLN: 5.0000 mg | Freq: Four times a day (QID) | INTRAVENOUS | Status: DC | PRN

## 2023-09-05 MED ORDER — MEGESTROL ACETATE 40 MG PO TABS
40.0000 mg | ORAL_TABLET | Freq: Every morning | ORAL | Status: DC
  Administered 2023-09-06: 40 mg via ORAL
  Filled 2023-09-05: qty 1

## 2023-09-05 MED ORDER — TIZANIDINE HCL 4 MG PO TABS: 4.0000 mg | ORAL_TABLET | Freq: Two times a day (BID) | ORAL | Status: DC | PRN

## 2023-09-05 MED ORDER — ACETAMINOPHEN 325 MG PO TABS: 650.0000 mg | ORAL_TABLET | Freq: Four times a day (QID) | ORAL | Status: DC | PRN

## 2023-09-05 MED ORDER — INSULIN GLARGINE-YFGN 100 UNIT/ML ~~LOC~~ SOLN
30.0000 [IU] | Freq: Every day | SUBCUTANEOUS | Status: DC
  Administered 2023-09-06: 30 [IU] via SUBCUTANEOUS
  Filled 2023-09-05: qty 0.3

## 2023-09-05 MED ORDER — ONDANSETRON HCL 4 MG/2ML IJ SOLN: 4.0000 mg | Freq: Four times a day (QID) | INTRAMUSCULAR | Status: DC | PRN

## 2023-09-05 MED ORDER — ALBUTEROL SULFATE HFA 108 (90 BASE) MCG/ACT IN AERS: 2.0000 | INHALATION_SPRAY | Freq: Four times a day (QID) | RESPIRATORY_TRACT | Status: DC | PRN

## 2023-09-05 MED ORDER — GABAPENTIN 400 MG PO CAPS
800.0000 mg | ORAL_CAPSULE | Freq: Three times a day (TID) | ORAL | Status: DC
  Administered 2023-09-05 – 2023-09-06 (×2): 800 mg via ORAL
  Filled 2023-09-05 (×2): qty 2

## 2023-09-05 MED ORDER — FLUTICASONE FUROATE-VILANTEROL 100-25 MCG/ACT IN AEPB
1.0000 | INHALATION_SPRAY | Freq: Every day | RESPIRATORY_TRACT | Status: DC
  Filled 2023-09-05: qty 28

## 2023-09-05 MED ORDER — PANTOPRAZOLE SODIUM 40 MG PO TBEC
40.0000 mg | DELAYED_RELEASE_TABLET | Freq: Every day | ORAL | Status: DC
  Administered 2023-09-06: 40 mg via ORAL
  Filled 2023-09-05: qty 1

## 2023-09-05 NOTE — Assessment & Plan Note (Signed)
 Suspect secondary to anemia, no AG.  Trend

## 2023-09-05 NOTE — Assessment & Plan Note (Signed)
 Repeat b12 pending

## 2023-09-05 NOTE — Assessment & Plan Note (Signed)
 Presenting creatinine of 1.51, typically normal  Likely secondary to hypovolemia with hypotension and anemia Check UA Strict I/O  Avoid nephrotoxic drugs  Received 1L IVF in ED Continue gentle fluids x 12 hours Trend

## 2023-09-05 NOTE — Assessment & Plan Note (Signed)
 Continue PPI daily.

## 2023-09-05 NOTE — Plan of Care (Signed)

## 2023-09-05 NOTE — ED Provider Notes (Signed)
 [place] EMERGENCY DEPARTMENT AT [facility] Provider Note   CSN: [id number] Arrival date & time: 09/05/23  1525     Patient presents with: Dizziness and Vaginal Bleeding   Melinda Stone is a 43 y.o. female.    Dizziness Vaginal Bleeding Associated symptoms: dizziness      Patient has a history of diabetes ovarian cyst pneumonia chronic back pain polyneuropathy migraines acid reflux.  Patient states she has symptomatic anemia related to irregular menstrual bleeding.  Patient states she is scheduled for ablation procedure.  Patient states she was walking to the pool today and started to feel very fatigued and tired.  Patient felt like her iron  was low again.  Her last menstrual period was over a month ago.  Patient started having menstrual bleeding again yesterday.  She denies any fevers or chills.  No vomiting or diarrhea.  No chest pain no shortness of breath.  Prior to Admission medications   Medication Sig Start Date End Date Taking? Authorizing Provider  acetaminophen  (TYLENOL ) 500 MG tablet Take 1,000 mg by mouth every 6 (six) hours as needed for mild pain (pain score 1-3) or moderate pain (pain score 4-6).    [provider]  albuterol  (VENTOLIN  HFA) 108 (90 Base) MCG/ACT inhaler Inhale 2 puffs into the lungs every 6 (six) hours as needed for wheezing or shortness of breath. 05/29/23   Setzer, Sandra J, PA-C  amitriptyline  (ELAVIL ) 25 MG tablet Take 1 tablet (25 mg total) by mouth at bedtime. 01/31/23   Wess Hammed, NP  Continuous Glucose Sensor (DEXCOM G7 SENSOR) MISC 1 Device by Does not apply route as directed. Change sensor every 10 days Patient taking differently: Inject 1 Device into the skin See admin instructions. Change sensor every 10 days 06/21/23   Shamleffer, Ibtehal Jaralla, MD  Cyanocobalamin  (VITAMIN B-12) 2500 MCG SUBL Place 1 tablet (2,500 mcg total) under the tongue daily. 04/23/23   Timmy Forbes, MD  doxycycline  (VIBRAMYCIN ) 100 MG  capsule Take 1 capsule (100 mg total) by mouth 2 (two) times daily. 07/21/23   Auston Blush, MD  ferrous sulfate  325 (65 FE) MG tablet Take 1 tablet (325 mg total) by mouth daily with breakfast. 05/29/23   Setzer, Hamp Levine, PA-C  furosemide  (LASIX ) 20 MG tablet Take 1 tablet (20 mg total) by mouth in the morning. 07/24/23   Tolia, Sunit, DO  gabapentin  (NEURONTIN ) 800 MG tablet Take 800 mg by mouth 3 (three) times daily.    [provider]  Galcanezumab -gnlm (EMGALITY ) 120 MG/ML SOAJ Inject 120 mg into the skin every 30 (thirty) days. 08/20/23   Wess Hammed, NP  glipiZIDE  (GLUCOTROL ) 10 MG tablet Take 2 tablets (20 mg total) by mouth daily before breakfast AND 1 tablet (10 mg total) daily before supper. 06/21/23   Shamleffer, Ibtehal Jaralla, MD  insulin  glargine (LANTUS  SOLOSTAR) 100 UNIT/ML Solostar Pen Inject 40 Units into the skin daily before breakfast. 06/21/23   Shamleffer, Ibtehal Jaralla, MD  losartan  (COZAAR ) 25 MG tablet Take 1 tablet (25 mg total) by mouth daily. 07/16/23   Tolia, Sunit, DO  megestrol  (MEGACE ) 40 MG tablet Take one by mouth in the morning and two at night 07/30/23   Tresia Fruit, MD  metoprolol  tartrate (LOPRESSOR ) 25 MG tablet Take 1 tablet (25 mg total) by mouth 2 (two) times daily. 05/29/23 07/31/23  Setzer, Sandra J, PA-C  mometasone -formoterol  (DULERA ) 100-5 MCG/ACT AERO Inhale 2 puffs into the lungs 2 (two) times daily at 10  AM and 5 PM. 05/29/23   Setzer, Sandra J, PA-C  Multiple Vitamin (MULTIVITAMIN WITH MINERALS) TABS tablet Take 1 tablet by mouth daily. 05/29/23   Setzer, Sandra J, PA-C  oxyCODONE -acetaminophen  (PERCOCET/ROXICET) 5-325 MG tablet Take 1 tablet by mouth every 6 (six) hours as needed for moderate pain (pain score 4-6) or severe pain (pain score 7-10).    [provider]  pantoprazole  (PROTONIX ) 40 MG tablet Take 1 tablet (40 mg total) by mouth daily. 05/29/23   Setzer, Hamp Levine, PA-C  Rimegepant Sulfate (NURTEC) 75 MG TBDP Take 1 tablet (75  mg total) by mouth as needed. Take 1 tablet at onset of headache, max is 1 tablet in 24 hours. 08/20/23   Wess Hammed, NP  tiZANidine  (ZANAFLEX ) 4 MG tablet Take 4 mg by mouth every 12 (twelve) hours as needed for muscle spasms. 06/10/23   [provider]  vitamin C  (ASCORBIC ACID ) 250 MG tablet Take 2 tablets (500 mg total) by mouth daily. Patient taking differently: Take 250 mg by mouth daily. 04/10/22   Timmy Forbes, MD    Allergies: Trulicity  [dulaglutide ]    Review of Systems  Genitourinary:  Positive for vaginal bleeding.  Neurological:  Positive for dizziness.    Updated Vital Signs BP (!) 99/51 (BP Location: Right Arm)   Pulse 92   Temp 98.6 F (37 C) (Oral)   Resp 16   Ht 1.753 m (5' 9)   Wt 98 kg   LMP 09/04/2023   SpO2 100%   BMI 31.90 kg/m   Physical Exam Vitals and nursing note reviewed.  Constitutional:      General: She is not in acute distress.    Appearance: She is well-developed.  HENT:     Head: Normocephalic and atraumatic.     Right Ear: External ear normal.     Left Ear: External ear normal.   Eyes:     General: No scleral icterus.       Right eye: No discharge.        Left eye: No discharge.     Conjunctiva/sclera: Conjunctivae normal.   Neck:     Trachea: No tracheal deviation.   Cardiovascular:     Rate and Rhythm: Normal rate and regular rhythm.  Pulmonary:     Effort: Pulmonary effort is normal. No respiratory distress.     Breath sounds: Normal breath sounds. No stridor. No wheezing or rales.  Abdominal:     General: Bowel sounds are normal. There is no distension.     Palpations: Abdomen is soft.     Tenderness: There is no abdominal tenderness. There is no guarding or rebound.  Genitourinary:    Vagina: No signs of injury.     Cervix: Cervical bleeding present.     Comments: Dark blood noted in rectal vault, no clots noted  Musculoskeletal:        General: No tenderness or deformity.     Cervical back: Neck supple.    Skin:    General: Skin is warm and dry.     Findings: No rash.   Neurological:     General: No focal deficit present.     Mental Status: She is alert.     Cranial Nerves: No cranial nerve deficit, dysarthria or facial asymmetry.     Sensory: No sensory deficit.     Motor: No abnormal muscle tone or seizure activity.     Coordination: Coordination normal.   Psychiatric:  Mood and Affect: Mood normal.     (all labs ordered are listed, but only abnormal results are displayed) Labs Reviewed  HCG, SERUM, QUALITATIVE  CBC  BASIC METABOLIC PANEL WITH GFR  TYPE AND SCREEN    EKG: None  Radiology: No results found.   .Critical Care  Performed by: Trish Furl, MD Authorized by: Trish Furl, MD   Critical care provider statement:    Critical care time (minutes):  30   Critical care was time spent personally by me on the following activities:  Development of treatment plan with patient or surrogate, discussions with consultants, evaluation of patient's response to treatment, examination of patient, ordering and review of laboratory studies, ordering and review of radiographic studies, ordering and performing treatments and interventions, pulse oximetry, re-evaluation of patient's condition and review of old charts    Medications Ordered in the ED  lactated ringers  bolus 1,000 mL (has no administration in time range)    Clinical Course as of 09/05/23 2017  Thu Sep 05, 2023  1824 Basic metabolic panel(!) Creatinine elevated, bicarb decreased compared to previous [JK]  1830 CBC(!!) Hgb decreased compared to previous [JK]  1831 Basic metabolic panel(!) [JK]  2016 Case discussed with Dr Sullivan Endow regarding admission [JK]    Clinical Course User Index [JK] Trish Furl, MD                                 Medical Decision Making Problems Addressed: Abnormal vaginal bleeding: acute illness or injury that poses a threat to life or bodily functions Anemia, unspecified type:  acute illness or injury that poses a threat to life or bodily functions  Amount and/or Complexity of Data Reviewed Labs: ordered. Decision-making details documented in ED Course.  Risk Prescription drug management. Decision regarding hospitalization.   Patient presented to the ED for evaluation of dizziness as well as vaginal bleeding.  Patient does have a history of menorrhagia.  She is scheduled for an ablation procedure next month.  Patient states she was supposed to be taking Megace .  She was not able to get a refill so she has not been taking it the last month.  Patient states her menstrual period started and she has been having heavier bleeding today.  She is not having any abdominal pain.  Hemoglobin has decreased down to 6.7.  I will start the patient back on her Megace  that she is post to be taken.  I have ordered a blood transfusion.  I will consult the medical service for admission    Final diagnoses:  None    ED Discharge Orders     None          Trish Furl, MD 09/05/23 2017

## 2023-09-05 NOTE — H&P (Signed)
 History and Physical    Patient: Melinda Stone:[id number] DOB: 01-21-1981 DOA: 09/05/2023 DOS: the patient was seen and examined on 09/05/2023 PCP: Marcial Setting, NP  Patient coming from: Home - lives with her 3 kids. Uses walker    Chief Complaint: dizziness and vaginal bleeding x 1 day   HPI: Melinda Stone is a 43 y.o. female with medical history significant of T2DM, GERD, polyneuropathy, IDA anemia,  b12 deficiency and migraines who presented to ED with complaints of dizziness and vaginal bleeding x 1 day. She was supposed to be on megace , but she has not been taking this x 1 month. She has ablation scheduled in July. She states her dizziness and lightheadedness started yesterday. Today she was at the pool with her kids and had such bad dizziness/lightheadedness just walking to the pool she came to ED. She denies any chest pain, shortness of breath or palpitations. Denies any other bleeding besides vaginal bleeding. Going through about 3 depends/day. She denies any blood in her stool. Followed by [facility] and had capsule study done yesterday.   Also has complains of pressure in her bladder after urination. No dysuria, urgency or frequency. Denies any CVA tenderness or change in color or smell of urine. NO blood. She is still concerned about a UTI.     Denies any fever/chills, vision changes/headaches, chest pain or palpitations, shortness of breath or cough, abdominal pain, N/V/D, dysuria or leg swelling.    She does not smoke or drink alcohol .   ER Course:  vitals: afebrile, bp: 99/51, HR: 92, RR: 16,oxygen: 100%RA Pertinent labs: hgb: 6.7, BUN: 25, creatinine: 1.51,  In ED: given 1L IVF, started on megace  and 2 units of PRBC ordered.  TR[facility] asked to admit.    Review of Systems: As mentioned in the history of present illness. All other systems reviewed and are negative. Past Medical History:  Diagnosis Date   Anemia    Arthritis 08/17/2020   Chronic back pain     Diabetes mellitus without complication (HCC)    type 2   GERD (gastroesophageal reflux disease)    Migraine    Ovarian cyst    Pneumonia    Polyneuropathy    Past Surgical History:  Procedure Laterality Date   CESAREAN SECTION     CHOLECYSTECTOMY  2004   EYE SURGERY Left    cataract   IR BONE MARROW BIOPSY & ASPIRATION  08/01/2023   LUMBAR LAMINECTOMY N/A 05/30/2020   Procedure: Lumbar five Laminectomy,  Bilateral Microdiscectomy, Left Lumbar five -Sacral one Microdiscectomy;  Surgeon: Adah Acron, MD;  Location: [facility] OR;  Service: Orthopedics;  Laterality: N/A;   LUMBAR LAMINECTOMY Left 11/14/2020   Procedure: LEFT LUMBAR FOUR-FIVE MICRODISCECTOMY;  Surgeon: Adah Acron, MD;  Location: [facility] OR;  Service: Orthopedics;  Laterality: Left;   TUBAL LIGATION  10/19/2010   Social History:  reports that she quit smoking about 20 months ago. Her smoking use included cigars. She has never used smokeless tobacco. She reports that she does not currently use alcohol . She reports that she does not currently use drugs.  Allergies  Allergen Reactions   Trulicity  [Dulaglutide ] Diarrhea    Family History  Problem Relation Age of Onset   Diabetes Mother    Hypertension Mother    Stroke Mother     Prior to Admission medications   Medication Sig Start Date End Date Taking? Authorizing Provider  acetaminophen  (TYLENOL ) 500 MG tablet Take 1,000 mg by mouth every 6 (  six) hours as needed for mild pain (pain score 1-3) or moderate pain (pain score 4-6).    [provider]  albuterol  (VENTOLIN  HFA) 108 (90 Base) MCG/ACT inhaler Inhale 2 puffs into the lungs every 6 (six) hours as needed for wheezing or shortness of breath. 05/29/23   Setzer, Hamp Levine, PA-C  amitriptyline  (ELAVIL ) 25 MG tablet Take 1 tablet (25 mg total) by mouth at bedtime. 01/31/23   Wess Hammed, NP  Continuous Glucose Sensor (DEXCOM G7 SENSOR) MISC 1 Device by Does not apply route as directed. Change sensor every 10  days Patient taking differently: Inject 1 Device into the skin See admin instructions. Change sensor every 10 days 06/21/23   Shamleffer, Ibtehal Jaralla, MD  Cyanocobalamin  (VITAMIN B-12) 2500 MCG SUBL Place 1 tablet (2,500 mcg total) under the tongue daily. 04/23/23   Timmy Forbes, MD  doxycycline  (VIBRAMYCIN ) 100 MG capsule Take 1 capsule (100 mg total) by mouth 2 (two) times daily. 07/21/23   Auston Blush, MD  ferrous sulfate  325 (65 FE) MG tablet Take 1 tablet (325 mg total) by mouth daily with breakfast. 05/29/23   Setzer, Sandra J, PA-C  furosemide  (LASIX ) 20 MG tablet Take 1 tablet (20 mg total) by mouth in the morning. 07/24/23   Tolia, Sunit, DO  gabapentin  (NEURONTIN ) 800 MG tablet Take 800 mg by mouth 3 (three) times daily.    [provider]  Galcanezumab -gnlm (EMGALITY ) 120 MG/ML SOAJ Inject 120 mg into the skin every 30 (thirty) days. 08/20/23   Wess Hammed, NP  glipiZIDE  (GLUCOTROL ) 10 MG tablet Take 2 tablets (20 mg total) by mouth daily before breakfast AND 1 tablet (10 mg total) daily before supper. 06/21/23   Shamleffer, Ibtehal Jaralla, MD  insulin  glargine (LANTUS  SOLOSTAR) 100 UNIT/ML Solostar Pen Inject 40 Units into the skin daily before breakfast. 06/21/23   Shamleffer, Ibtehal Jaralla, MD  losartan  (COZAAR ) 25 MG tablet Take 1 tablet (25 mg total) by mouth daily. 07/16/23   Tolia, Sunit, DO  megestrol  (MEGACE ) 40 MG tablet Take one by mouth in the morning and two at night 07/30/23   Tresia Fruit, MD  metoprolol  tartrate (LOPRESSOR ) 25 MG tablet Take 1 tablet (25 mg total) by mouth 2 (two) times daily. 05/29/23 07/31/23  Setzer, Sandra J, PA-C  mometasone -formoterol  (DULERA ) 100-5 MCG/ACT AERO Inhale 2 puffs into the lungs 2 (two) times daily at 10 AM and 5 PM. 05/29/23   Setzer, Sandra J, PA-C  Multiple Vitamin (MULTIVITAMIN WITH MINERALS) TABS tablet Take 1 tablet by mouth daily. 05/29/23   Setzer, Sandra J, PA-C  oxyCODONE -acetaminophen  (PERCOCET/ROXICET) 5-325 MG tablet Take 1  tablet by mouth every 6 (six) hours as needed for moderate pain (pain score 4-6) or severe pain (pain score 7-10).    [provider]  pantoprazole  (PROTONIX ) 40 MG tablet Take 1 tablet (40 mg total) by mouth daily. 05/29/23   Setzer, Hamp Levine, PA-C  Rimegepant Sulfate (NURTEC) 75 MG TBDP Take 1 tablet (75 mg total) by mouth as needed. Take 1 tablet at onset of headache, max is 1 tablet in 24 hours. 08/20/23   Wess Hammed, NP  tiZANidine  (ZANAFLEX ) 4 MG tablet Take 4 mg by mouth every 12 (twelve) hours as needed for muscle spasms. 06/10/23   [provider]  vitamin C  (ASCORBIC ACID ) 250 MG tablet Take 2 tablets (500 mg total) by mouth daily. Patient taking differently: Take 250 mg by mouth daily. 04/10/22   Timmy Forbes, MD  Physical Exam: Vitals:   09/05/23 1819 09/05/23 2008 09/05/23 2025 09/05/23 2030  BP: (!) 150/79 119/72 136/76 138/71  Pulse: 83 93 86 87  Resp: 18 20 17 16   Temp: 98.6 F (37 C) 98 F (36.7 C) 97.9 F (36.6 C) 97.9 F (36.6 C)  TempSrc: Oral Oral    SpO2: 100% 100% 100% 100%  Weight:      Height:       General:  Appears calm and comfortable and is in NAD Eyes:  PERRL, EOMI, normal lids, iris ENT:  grossly normal hearing, lips & tongue, mmm; appropriate dentition Neck:  no LAD, masses or thyromegaly; no carotid bruits Cardiovascular:  RRR, no m/r/g. No LE edema.  Respiratory:   CTA bilaterally with no wheezes/rales/rhonchi.  Normal respiratory effort. Abdomen:  soft, NT, ND, NABS Back:   normal alignment, no CVAT Skin:  no rash or induration seen on limited exam Musculoskeletal:  grossly normal tone BUE/BLE, good ROM, no bony abnormality Lower extremity:  No LE edema.  Limited foot exam with no ulcerations.  2+ distal pulses. Psychiatric:  grossly normal mood and affect, speech fluent and appropriate, AOx3 Neurologic:  CN 2-12 grossly intact, moves all extremities in coordinated fashion, sensation intact   Radiological Exams on  Admission: Independently reviewed - see discussion in A/P where applicable  No results found.  EKG: Independently reviewed.  NSR with rate 82; nonspecific ST changes with no evidence of acute ischemia   Labs on Admission: I have personally reviewed the available labs and imaging studies at the time of the admission.  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  -has not been taking megace  x 1 month, started back in ED by EDP, ablation planned for July -followed by hematology outpatient: Refractory to blood transfusion.  low haptoglobin, normal Ldh, normal bilirubin.  no M protein on myeloma panel, negative parvo virus DNA PCR, CMV ? Ineffective erythropoiesis vs hemolysis. negative DAT,  normal haptoglobin, negative [facility] Bone marrow biopsy showed Slightly hypercellular bone marrow with erythroid hyperplasia, No viral cytopathic changes or significant dyspoiesis. Suspect secondary process. Normal cytogenetics, normal myeloid NGS  -hx of B12 deficiency: recheck B12  -denies any other sources of bleeding -transfuse x 2 units of PRBC and trend  -capsule study done by GI yesterday (followed by [facility]) -monitor vaginal bleeding/hgb trend   AKI (acute kidney injury) (HCC) Presenting creatinine of 1.51, typically normal   Likely secondary to hypovolemia with hypotension and anemia Check UA Strict I/O  Avoid nephrotoxic drugs  Received 1L IVF in ED Continue gentle fluids x 12 hours Trend   Menorrhagia Ablation planned for July Off her megace  x 1 month Restarted in ED Monitor vaginal bleeding  Pelvic US  done 04/2023: fibroid and right hydrosalpinx  Endometrial bx  done 08/19/23: negative for atypia/malignancy   Metabolic acidosis Suspect secondary to anemia, no AG.  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 PMP website verified.   HTN (hypertension) Initially hypotensive, but now normotensive to high  Hold cozaar  with AKI, continue metoprolol  25mg  BID  Hold lasix    Esophageal reflux Continue PPI daily   Type 2 diabetes mellitus with diabetic polyneuropathy, without long-term current use of insulin  (HCC) A1C of 4.6 in May of 2025  Hold glipizide  Continue long acting insulin   SSI and accuchecks QAC/HS  Continue gabapentin  for neuropathy   Urinary tract infection symptoms F/u on UA results and trend to culture if appropriate     Advance Care Planning:   Code Status: Full Code   Consults: none   DVT Prophylaxis: SCDs  Family Communication: none   Severity of Illness: The appropriate patient status for this patient is OBSERVATION. Observation status is judged to be reasonable and necessary in order to provide the required intensity of service to ensure the patient's safety. The patient's presenting symptoms, physical exam findings, and initial radiographic and laboratory data in the context of their medical condition is felt to place them at decreased risk for further clinical deterioration. Furthermore, it is anticipated that the patient will be medically stable for discharge from the hospital within 2 midnights of admission.   Author: Raymona Caldwell, MD 09/05/2023 8:57 PM  For on call review [email or web address].

## 2023-09-05 NOTE — Assessment & Plan Note (Signed)
 A1C of 4.6 in May of 2025  Hold glipizide  Continue long acting insulin   SSI and accuchecks QAC/HS  Continue gabapentin  for neuropathy

## 2023-09-05 NOTE — Assessment & Plan Note (Signed)
 F/u on UA results and trend to culture if appropriate

## 2023-09-05 NOTE — Assessment & Plan Note (Addendum)
 Ablation planned for July Off her megace  x 1 month Restarted in ED Monitor vaginal bleeding  Pelvic US  done 04/2023: fibroid and right hydrosalpinx  Endometrial bx done 08/19/23: negative for atypia/malignancy

## 2023-09-05 NOTE — Assessment & Plan Note (Addendum)
 43 year old presenting to ED with dizziness and vaginal bleeding found to have symptomatic acute on chronic anemia with hgb to 6.7 -obs to tele  -hgb was 8.4 on June 9th.  -has not been taking megace  x 1 month, started back in ED by EDP, ablation planned for July -followed by hematology outpatient: Refractory to blood transfusion.  low haptoglobin, normal Ldh, normal bilirubin.  no M protein on myeloma panel, negative parvo virus DNA PCR, CMV ? Ineffective erythropoiesis vs hemolysis. negative DAT,  normal haptoglobin, negative [facility] Bone marrow biopsy showed Slightly hypercellular bone marrow with erythroid hyperplasia, No viral cytopathic changes or significant dyspoiesis. Suspect secondary process. Normal cytogenetics, normal myeloid NGS  -hx of B12 deficiency: recheck B12  -denies any other sources of bleeding -transfuse x 2 units of PRBC and trend  -capsule study done by GI yesterday (followed by [facility]) -monitor vaginal bleeding/hgb trend

## 2023-09-05 NOTE — Assessment & Plan Note (Signed)
 Continue pain medication with gabapentin  and oxycodone . PMP website verified.

## 2023-09-05 NOTE — ED Triage Notes (Addendum)
 Pt c/o dizziness and vaginal bleeding x1 days.  Denies new pain.  Denies GI complaints.  Pt reports she thinks her hemoglobin is low.  Pt is supposed to have an ablation in July.    BP is soft during triage.   Pt reports she had to get 5 units of blood last month.

## 2023-09-05 NOTE — Assessment & Plan Note (Signed)
 Initially hypotensive, but now normotensive to high  Hold cozaar  with AKI, continue metoprolol  25mg  BID  Hold lasix 

## 2023-09-06 ENCOUNTER — Other Ambulatory Visit (HOSPITAL_COMMUNITY): Payer: Self-pay

## 2023-09-06 DIAGNOSIS — D649 Anemia, unspecified: Secondary | ICD-10-CM | POA: Diagnosis not present

## 2023-09-06 LAB — URINALYSIS, ROUTINE W REFLEX MICROSCOPIC

## 2023-09-06 LAB — URINALYSIS, MICROSCOPIC (REFLEX)
RBC / HPF: >50 RBC/hpf (ref 0–5)
Squamous Epithelial / HPF: NONE SEEN /HPF (ref 0–5)

## 2023-09-06 LAB — HEPATIC FUNCTION PANEL
ALT: 8 U/L (ref 0–44)
AST: 11 U/L — ABNORMAL LOW (ref 15–41)
Albumin: 3.3 g/dL — ABNORMAL LOW (ref 3.5–5.0)
Alkaline Phosphatase: 113 U/L (ref 38–126)
Bilirubin, Direct: 0.1 mg/dL (ref 0.0–0.2)
Indirect Bilirubin: 1.2 mg/dL — ABNORMAL HIGH (ref 0.3–0.9)
Total Bilirubin: 1.3 mg/dL — ABNORMAL HIGH (ref 0.0–1.2)
Total Protein: 6.1 g/dL — ABNORMAL LOW (ref 6.5–8.1)

## 2023-09-06 LAB — CBC
HCT: 26.8 % — ABNORMAL LOW (ref 36.0–46.0)
Hemoglobin: 9.1 g/dL — ABNORMAL LOW (ref 12.0–15.0)
MCH: 28.7 pg (ref 26.0–34.0)
MCHC: 34 g/dL (ref 30.0–36.0)
MCV: 84.5 fL (ref 80.0–100.0)
Platelets: 135 10*3/uL — ABNORMAL LOW (ref 150–400)
RBC: 3.17 MIL/uL — ABNORMAL LOW (ref 3.87–5.11)
RDW: 15.9 % — ABNORMAL HIGH (ref 11.5–15.5)
WBC: 7.2 10*3/uL (ref 4.0–10.5)
nRBC: 0 % (ref 0.0–0.2)

## 2023-09-06 LAB — TYPE AND SCREEN
ABO/RH(D): A POS
Antibody Screen: NEGATIVE
Unit division: 0
Unit division: 0

## 2023-09-06 LAB — PROTIME-INR
INR: 1 (ref 0.8–1.2)
Prothrombin Time: 13.4 s (ref 11.4–15.2)

## 2023-09-06 LAB — BPAM RBC
Blood Product Expiration Date: 202507172359
Blood Product Expiration Date: 202507172359
ISSUE DATE / TIME: 202506192000
ISSUE DATE / TIME: 202506192322
Unit Type and Rh: 6200
Unit Type and Rh: 6200

## 2023-09-06 LAB — VITAMIN B12: Vitamin B-12: 350 pg/mL (ref 180–914)

## 2023-09-06 LAB — BASIC METABOLIC PANEL WITH GFR
Anion gap: 7 (ref 5–15)
BUN: 22 mg/dL — ABNORMAL HIGH (ref 6–20)
CO2: 18 mmol/L — ABNORMAL LOW (ref 22–32)
Calcium: 8.6 mg/dL — ABNORMAL LOW (ref 8.9–10.3)
Chloride: 112 mmol/L — ABNORMAL HIGH (ref 98–111)
Creatinine, Ser: 1.14 mg/dL — ABNORMAL HIGH (ref 0.44–1.00)
GFR, Estimated: >60 mL/min (ref >60)
Glucose, Bld: 241 mg/dL — ABNORMAL HIGH (ref 70–99)
Potassium: 4 mmol/L (ref 3.5–5.1)
Sodium: 137 mmol/L (ref 135–145)

## 2023-09-06 LAB — GLUCOSE, CAPILLARY
Glucose-Capillary: 228 mg/dL — ABNORMAL HIGH (ref 70–99)
Glucose-Capillary: 215 mg/dL — ABNORMAL HIGH (ref 70–99)

## 2023-09-06 MED ORDER — MEGESTROL ACETATE 40 MG PO TABS
80.0000 mg | ORAL_TABLET | Freq: Every day | ORAL | 0 refills | Status: AC
  Filled 2023-09-06: qty 48, 24d supply, fill #0

## 2023-09-06 NOTE — Progress Notes (Signed)
   09/06/23 1318  TOC Brief Assessment  Insurance and Status Reviewed  Patient has primary care physician Yes  Home environment has been reviewed Apartment  Prior level of function: Independent  Prior/Current Home Services No current home services  Social Drivers of Health Review SDOH reviewed no interventions necessary  Readmission risk has been reviewed Yes  Transition of care needs no transition of care needs at this time

## 2023-09-07 LAB — HAPTOGLOBIN: Haptoglobin: <10 mg/dL — ABNORMAL LOW (ref 42–296)

## 2023-09-07 NOTE — Discharge Summary (Signed)
 Physician Discharge Summary   Patient: Melinda Stone MRN: [id number] DOB: 10-Feb-1981  Admit date:     09/05/2023  Discharge date: 09/06/2023  Discharge Physician: Brigida Bureau   PCP: [name], NP   Recommendations at discharge:    Discharge to home Follow up with PCP in 7-10 days. Have CBC checked at that visit and reported to PCP. Keep appointment for endometrial ablation on 10/01/2023.  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 She was found to have a hemoglobin of 6.7. She had been on megace  for menorrhagia, but had not taken it for 2 months. She is scheduled for an endometrial ablation on 10/01/2023.   The patient was admitted to observation status. She was restarted on Megace  and transfused with 2 units PRBC's. On the morning of discharge her bleeding is much improved, and she is feeling well. She will be discharged to home on a dose of megace  to continue until she undergoes her procedure.  She is discharged to home in fair condition.  Assessment and Plan: * Symptomatic anemia 43 year old presenting to ED with dizziness and vaginal bleeding found to have symptomatic acute on chronic anemia with hgb to 6.7. She was restarted on Megace  and transfused with 2 units PRBC's. On the morning of discharge her bleeding is much improved, and she is feeling well. She will be discharged  to home on a dose of megace  to continue until she undergoes her procedure.  AKI (acute kidney injury) (HCC) Presenting creatinine of 1.51, typically normal . Improved to 1.14. Likely secondary to hypovolemia with hypotension and anemia   Menorrhagia Ablation planned for July Off her megace  x 1 month Restarted in ED. Will continue until date of procedure. Vaginal bleeding is improved.  Pelvic US  done 04/2023: fibroid and right hydrosalpinx  Endometrial bx done 08/19/23: negative for atypia/malignancy   Metabolic acidosis Suspect secondary to anemia, no AG.  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 PMP website verified.   HTN (hypertension) Initially hypotensive, but now normotensive to high  Hold cozaar  with AKI, continue metoprolol  25mg  BID  Hold lasix    Esophageal reflux Continue PPI daily   Type 2 diabetes mellitus with diabetic polyneuropathy, without long-term current use of insulin  (HCC) A1C of 4.6 in May of 2025  Hold glipizide  Continue long acting insulin   SSI and accuchecks QAC/HS  Continue gabapentin  for neuropathy   Urinary tract infection symptoms No UTI on UA.  Consultants: None Procedures performed: Transfusion of 2 units PRBC's  Disposition: Home Diet recommendation:  Discharge Diet Orders (From admission, onward)     Start     Ordered   09/06/23 0000  Diet - low sodium heart healthy        09/06/23 1317  Cardiac diet DISCHARGE MEDICATION: Allergies as of 09/06/2023       Reactions   Trulicity  [dulaglutide ] Diarrhea        Medication List     TAKE these medications    acetaminophen  500 MG tablet Commonly known as: TYLENOL  Take 1,000 mg by mouth every 6 (six) hours as needed for mild pain (pain score 1-3) or moderate pain (pain score 4-6).   amitriptyline  25 MG tablet Commonly known  as: ELAVIL  Take 1 tablet (25 mg total) by mouth at bedtime.   Dexcom G7 Sensor Misc 1 Device by Does not apply route as directed. Change sensor every 10 days What changed:  how to take this when to take this   Dulera  100-5 MCG/ACT Aero Generic drug: mometasone -formoterol  Inhale 2 puffs into the lungs 2 (two) times daily at 10 AM and 5 PM.   Emgality  120 MG/ML Soaj Generic drug: Galcanezumab -gnlm Inject 120 mg into the skin every 30 (thirty) days.   FeroSul 325 (65 Fe) MG tablet Generic drug: ferrous sulfate  Take 1 tablet (325 mg total) by mouth daily with breakfast.   furosemide  20 MG tablet Commonly known as: LASIX  Take 1 tablet (20 mg total) by mouth in the morning.   gabapentin  800 MG tablet Commonly known as: NEURONTIN  Take 800 mg by mouth 3 (three) times daily.   glipiZIDE  10 MG tablet Commonly known as: GLUCOTROL  Take 2 tablets (20 mg total) by mouth daily before breakfast AND 1 tablet (10 mg total) daily before supper.   Lantus  SoloStar 100 UNIT/ML Solostar Pen Generic drug: insulin  glargine Inject 40 Units into the skin daily before breakfast.   losartan  25 MG tablet Commonly known as: COZAAR  Take 1 tablet (25 mg total) by mouth daily.   megestrol  40 MG tablet Commonly known as: MEGACE  Take 2 tablets (80 mg total) by mouth at bedtime for 24 days. What changed:  how much to take how to take this when to take this additional instructions   metoprolol  tartrate 25 MG tablet Commonly known as: LOPRESSOR  Take 1 tablet (25 mg total) by mouth 2 (two) times daily.   multivitamin with minerals Tabs tablet Take 1 tablet by mouth daily.   Nurtec 75 MG Tbdp Generic drug: Rimegepant Sulfate Take 1 tablet (75 mg total) by mouth as needed. Take 1 tablet at onset of headache, max is 1 tablet in 24 hours.   oxyCODONE -acetaminophen  5-325 MG tablet Commonly known as: PERCOCET/ROXICET Take 1 tablet by mouth every 6 (six) hours as needed for moderate pain (pain score  4-6) or severe pain (pain score 7-10).   pantoprazole  40 MG tablet Commonly known as: PROTONIX  Take 1 tablet (40 mg total) by mouth daily.   tiZANidine  4 MG tablet Commonly known as: ZANAFLEX  Take 4 mg by mouth every 12 (twelve) hours as needed for muscle spasms.   Ventolin  HFA 108 (90 Base) MCG/ACT inhaler Generic drug: albuterol  Inhale 2 puffs into the lungs every 6 (six) hours as needed for wheezing or shortness of breath.   Vitamin B-12 2500 MCG Subl Place 1 tablet (2,500 mcg total) under the tongue daily.   vitamin C  250 MG tablet Commonly known as: ASCORBIC ACID  Take 2 tablets (500 mg total) by mouth daily.        Discharge Exam: Filed Weights   09/05/23 1537  Weight: 98 kg   Exam:  Constitutional:  The patient is awake, alert, and oriented x 3. No acute distress. Eyes:  pupils and irises appear normal Normal lids  and conjunctivae ENMT:  grossly normal hearing  Lips appear normal external ears, nose appear normal Oropharynx: mucosa, tongue,posterior pharynx appear normal Neck:  neck appears normal, no masses, normal ROM, supple no thyromegaly Respiratory:  No increased work of breathing. No wheezes, rales, or rhonchi No tactile fremitus Cardiovascular:  Regular rate and rhythm No murmurs, ectopy, or gallups. No lateral PMI. No thrills. Abdomen:  Abdomen is soft, non-tender, non-distended No hernias, masses, or organomegaly Normoactive bowel sounds.  Musculoskeletal:  No cyanosis, clubbing, or edema Skin:  No rashes, lesions, ulcers palpation of skin: no induration or nodules Neurologic:  CN 2-12 intact Sensation all 4 extremities intact Psychiatric:  Mental status Mood, affect appropriate Orientation to person, place, time  judgment and insight appear intact   Condition at discharge: good  The results of significant diagnostics from this hospitalization (including imaging, microbiology, ancillary and laboratory) are listed below for  reference.   Imaging Studies: No results found.  Microbiology: Results for orders placed or performed during the hospital encounter of 07/31/23  Culture, blood (Routine X 2) w Reflex to ID Panel     Status: None   Collection Time: 08/01/23  5:40 AM   Specimen: BLOOD RIGHT ARM  Result Value Ref Range Status   Specimen Description   Final    BLOOD RIGHT ARM Performed at [facility], [address] [place], [place] [address]    Special Requests   Final    BOTTLES DRAWN AEROBIC ONLY Blood Culture results may not be optimal due to an inadequate volume of blood received in culture bottles Performed at [facility], [address] [place], [place] [address]    Culture   Final    NO GROWTH 5 DAYS Performed at [facility], [address], [place], [place] [address]    Report Status 08/06/2023 FINAL  Final  Culture, blood (Routine X 2) w Reflex to ID Panel     Status: None   Collection Time: 08/01/23  5:40 AM   Specimen: BLOOD RIGHT HAND  Result Value Ref Range Status   Specimen Description   Final    BLOOD RIGHT HAND Performed at [facility], [address], [place], [place] [address]    Special Requests   Final    BOTTLES DRAWN AEROBIC ONLY Blood Culture results may not be optimal due to an inadequate volume of blood received in culture bottles Performed at [facility], [address] [place], [place] [address]    Culture   Final    NO GROWTH 5 DAYS Performed at [facility], [address] [place], [place] [address]    Report Status 08/06/2023 FINAL  Final    Labs: CBC: Recent Labs  Lab 09/05/23 1717 09/06/23 0529  WBC 8.1 7.2  HGB 6.7* 9.1*  HCT 19.9* 26.8*  MCV 83.6 84.5  PLT 158 135*   Basic Metabolic Panel: Recent Labs  Lab 09/05/23 1717 09/06/23 0529  NA 135 137  K 4.1 4.0  CL 111 112*  CO2 15* 18*  GLUCOSE 225* 241*  BUN 25* 22*  CREATININE 1.51* 1.14*  CALCIUM  8.3* 8.6*   Liver  Function Tests: Recent Labs  Lab 09/06/23 1029  AST 11*  ALT 8  ALKPHOS 113  BILITOT 1.3*  PROT 6.1*  ALBUMIN 3.3*   CBG: Recent Labs  Lab 09/05/23 2222 09/06/23 0831 09/06/23 1206  GLUCAP 166* 228* 215*    Discharge time spent: greater  than 30 minutes.  Signed: Colandra Ohanian, DO [facility] 09/07/2023

## 2023-09-09 ENCOUNTER — Inpatient Hospital Stay

## 2023-09-09 DIAGNOSIS — D519 Vitamin B12 deficiency anemia, unspecified: Secondary | ICD-10-CM

## 2023-09-09 DIAGNOSIS — D509 Iron deficiency anemia, unspecified: Secondary | ICD-10-CM | POA: Diagnosis not present

## 2023-09-09 LAB — CBC WITH DIFFERENTIAL (CANCER CENTER ONLY)
Abs Immature Granulocytes: 0.03 10*3/uL (ref 0.00–0.07)
Basophils Absolute: 0.1 10*3/uL (ref 0.0–0.1)
Basophils Relative: 1 %
Eosinophils Absolute: 0.1 10*3/uL (ref 0.0–0.5)
Eosinophils Relative: 1 %
HCT: 22 % — ABNORMAL LOW (ref 36.0–46.0)
Hemoglobin: 7.7 g/dL — ABNORMAL LOW (ref 12.0–15.0)
Immature Granulocytes: 0 %
Lymphocytes Relative: 21 %
Lymphs Abs: 1.6 10*3/uL (ref 0.7–4.0)
MCH: 28.7 pg (ref 26.0–34.0)
MCHC: 35 g/dL (ref 30.0–36.0)
MCV: 82.1 fL (ref 80.0–100.0)
Monocytes Absolute: 0.4 10*3/uL (ref 0.1–1.0)
Monocytes Relative: 5 %
Neutro Abs: 5.7 10*3/uL (ref 1.7–7.7)
Neutrophils Relative %: 72 %
Platelet Count: 132 10*3/uL — ABNORMAL LOW (ref 150–400)
RBC: 2.68 MIL/uL — ABNORMAL LOW (ref 3.87–5.11)
RDW: 16.7 % — ABNORMAL HIGH (ref 11.5–15.5)
WBC Count: 7.8 10*3/uL (ref 4.0–10.5)
nRBC: 0 % (ref 0.0–0.2)

## 2023-09-09 LAB — SAMPLE TO BLOOD BANK

## 2023-09-09 LAB — RETIC PANEL
Immature Retic Fract: 7.2 % (ref 2.3–15.9)
RBC.: 2.69 MIL/uL — ABNORMAL LOW (ref 3.87–5.11)
Retic Count, Absolute: 167.9 10*3/uL (ref 19.0–186.0)
Retic Ct Pct: 6.2 % — ABNORMAL HIGH (ref 0.4–3.1)
Reticulocyte Hemoglobin: 31.8 pg (ref >27.9)

## 2023-09-10 ENCOUNTER — Other Ambulatory Visit: Payer: Self-pay | Admitting: Oncology

## 2023-09-10 ENCOUNTER — Inpatient Hospital Stay

## 2023-09-10 ENCOUNTER — Other Ambulatory Visit: Payer: Self-pay

## 2023-09-10 ENCOUNTER — Inpatient Hospital Stay (HOSPITAL_BASED_OUTPATIENT_CLINIC_OR_DEPARTMENT_OTHER): Admitting: Oncology

## 2023-09-10 ENCOUNTER — Encounter: Payer: Self-pay | Admitting: Oncology

## 2023-09-10 VITALS — BP 152/72 | HR 84 | Temp 97.8°F | Resp 16 | Wt 217.4 lb

## 2023-09-10 DIAGNOSIS — D508 Other iron deficiency anemias: Secondary | ICD-10-CM

## 2023-09-10 DIAGNOSIS — D649 Anemia, unspecified: Secondary | ICD-10-CM

## 2023-09-10 DIAGNOSIS — E538 Deficiency of other specified B group vitamins: Secondary | ICD-10-CM | POA: Diagnosis not present

## 2023-09-10 DIAGNOSIS — D696 Thrombocytopenia, unspecified: Secondary | ICD-10-CM

## 2023-09-10 DIAGNOSIS — R162 Hepatomegaly with splenomegaly, not elsewhere classified: Secondary | ICD-10-CM

## 2023-09-10 DIAGNOSIS — D509 Iron deficiency anemia, unspecified: Secondary | ICD-10-CM | POA: Diagnosis not present

## 2023-09-10 LAB — SEDIMENTATION RATE: Sed Rate: 52 mm/h — ABNORMAL HIGH (ref 0–20)

## 2023-09-10 LAB — DIRECT ANTIGLOBULIN TEST (NOT AT ARMC)
DAT, IgG: NEGATIVE
DAT, complement: NEGATIVE

## 2023-09-10 LAB — PREPARE RBC (CROSSMATCH)

## 2023-09-10 MED ORDER — DIPHENHYDRAMINE HCL 25 MG PO CAPS
25.0000 mg | ORAL_CAPSULE | Freq: Once | ORAL | Status: AC
  Administered 2023-09-10: 25 mg via ORAL
  Filled 2023-09-10: qty 1

## 2023-09-10 MED ORDER — FUROSEMIDE 10 MG/ML IJ SOLN
20.0000 mg | Freq: Once | INTRAMUSCULAR | Status: AC
  Administered 2023-09-10: 20 mg via INTRAVENOUS
  Filled 2023-09-10: qty 2

## 2023-09-10 MED ORDER — DEXAMETHASONE 4 MG PO TABS: 40.0000 mg | ORAL_TABLET | Freq: Every day | ORAL | 0 refills | Status: DC

## 2023-09-10 MED ORDER — SODIUM CHLORIDE 0.9% IV SOLUTION
250.0000 mL | INTRAVENOUS | Status: DC
  Administered 2023-09-10: 100 mL via INTRAVENOUS
  Filled 2023-09-10: qty 250

## 2023-09-10 MED ORDER — ACETAMINOPHEN 325 MG PO TABS
650.0000 mg | ORAL_TABLET | Freq: Once | ORAL | Status: AC
  Administered 2023-09-10: 650 mg via ORAL
  Filled 2023-09-10: qty 2

## 2023-09-10 NOTE — Assessment & Plan Note (Addendum)
 Possibly due to fatty liver disease. Negative hepatitis B and C Negative CMV HIV parvovirus Negative BCR ABL1 FISH, JAK2 mutation. ?  Evan syndrome

## 2023-09-10 NOTE — Assessment & Plan Note (Signed)
 Lab Results  Component Value Date   HGB 7.7 (L) 09/09/2023   TIBC 353 07/23/2023   IRONPCTSAT 26 07/23/2023   FERRITIN 206 07/23/2023    Adequate iron  level.  Hold off Venofer  treatments.

## 2023-09-10 NOTE — Patient Instructions (Signed)

## 2023-09-10 NOTE — Assessment & Plan Note (Addendum)
 Refractory to blood transfusion.  low haptoglobin, normal Ldh, normal bilirubin.  no M protein on myeloma panel, negative parvo virus DNA PCR, CMV ? Ineffective erythropoiesis vs hemolysis. 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 Suspect secondary process. Normal cytogenetics, normal myeloid NGS  She also has decreased platelet counts and previously responded to steroids.  Check ANA, CRP ESR, cold agglutinin.  If negative, will check [facility] panel. I recommend trials of dexamethasone  40 mg daily for 4 days. Hemoglobin is 7.7, recommend 1 unit PRBC transfusion for symptomatic anemia.

## 2023-09-10 NOTE — Assessment & Plan Note (Signed)
 Mild.  Previously responded to steroids.

## 2023-09-10 NOTE — Progress Notes (Signed)
 Hematology/Oncology Progress note [phone] [phone]     Patient Care Team: [name], NP as PCP - General (Nurse Practitioner) Michele Richardson, DO as PCP - Cardiology (Cardiology) Babara Call, MD as Consulting Physician (Oncology)  ASSESSMENT & PLAN:   Normocytic anemia Refractory to blood transfusion.  low haptoglobin, normal Ldh, normal bilirubin.  no M protein on myeloma panel, negative parvo virus DNA PCR, CMV ? Ineffective erythropoiesis vs hemolysis. 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 Suspect secondary process. Normal cytogenetics, normal myeloid NGS  She also has decreased platelet counts and previously responded to steroids.  Check ANA, CRP ESR, cold agglutinin.  If negative, will check [facility] panel. I recommend trials of dexamethasone  40 mg daily for 4 days. Hemoglobin is 7.7, recommend 1 unit PRBC transfusion for symptomatic anemia.  Thrombocytopenia (HCC) Mild.  Previously responded to steroids.  Low serum vitamin B12 S/p vitamin B12 injections, she did not tolerate due to muscle cramps.  B12 level stable continue sublingual B12 1000mcg daily.    IDA (iron  deficiency anemia) Lab Results  Component Value Date   HGB 7.7 (L) 09/09/2023   TIBC 353 07/23/2023   IRONPCTSAT 26 07/23/2023   FERRITIN 206 07/23/2023    Adequate iron  level.  Hold off Venofer  treatments.  Hepatosplenomegaly Possibly due to fatty liver disease. Negative hepatitis B and C Negative CMV HIV parvovirus Negative BCR ABL1 FISH, JAK2 mutation. ?  Evan syndrome   Orders Placed This Encounter  Procedures   Glucose 6 phosphate dehydrogenase    Standing Status:   Future    Number of Occurrences:   1    Expected Date:   09/10/2023    Expiration Date:   12/09/2023   Cold agglutinin titer    Standing Status:    Future    Number of Occurrences:   1    Expected Date:   09/10/2023    Expiration Date:   12/09/2023   Rheumatoid factor    Standing Status:   Future    Number of Occurrences:   1    Expected Date:   09/10/2023    Expiration Date:   12/09/2023   CYCLIC CITRUL PEPTIDE ANTIBODY, IGG/IGA    Standing Status:   Future    Number of Occurrences:   1    Expected Date:   09/10/2023    Expiration Date:   12/09/2023   Sedimentation rate    Standing Status:   Future    Number of Occurrences:   1    Expected Date:   09/10/2023    Expiration Date:   12/09/2023   ANA w/Reflex    Standing Status:   Future    Number of Occurrences:   1    Expected Date:   09/10/2023    Expiration Date:   09/09/2024   Direct antiglobulin test    Standing Status:   Future    Number of Occurrences:   1    Expected Date:   09/10/2023    Expiration Date:   12/09/2023    Follow-up per LOS  All questions were answered. The patient knows to call the clinic with any problems, questions or concerns.  Call Babara, MD, PhD [facility] Hematology Oncology 09/10/2023   CHIEF COMPLAINTS/REASON FOR VISIT:  iron  deficiency anemia  HISTORY OF PRESENTING ILLNESS:   Melinda Stone is a  43 y.o.  female presents for  iron  deficiency anemia.  Patient had blood work done on 07/13/2021, CBC showed hemoglobin of 9.0, MCV 75.3, ferritin 14, iron  saturation 21, TIBC 455.  Reviewed her previous lab records.  Patient has chronic anemia since at least 2019.  + Nausea, no vomiting.  Chronic diarrhea after each meal.  Unintentional weight loss Patient reports that she has had colonoscopy done in May which did not reveal etiology of diarrhea.  Colonoscopy record was not available to me at the time of dictation. 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  05/11/2023 - 05/23/2023 Patient was hospitalized due to hypoxic respiratory failure, influenza A, ARDS, pneumonia and she was discharged to rehab  after hospitalization.  INTERVAL HISTORY Melinda Stone is a 43 y.o. female who has above history reviewed by me today presents for follow up visit for iron  deficiency anemia Patient takes oral vitamin B12 supplementation at 1000 mcg daily. Chronic fatigue chronic diarrhea, unchanged. No fever, chills, unintentional weight loss, night sweats.  Denies hematochezia, hematuria, hematemesis, epistaxis, black tarry stool or easy bruising.  Endometrial biopsy negative for malignancy.    Review of Systems  Constitutional:  Positive for fatigue. Negative for chills and fever.  HENT:   Negative for hearing loss and voice change.   Eyes:  Negative for eye problems.  Respiratory:  Negative for chest tightness and cough.   Cardiovascular:  Negative for chest pain.  Gastrointestinal:  Negative for abdominal distention, abdominal pain, blood in stool, diarrhea, nausea and vomiting.  Endocrine: Negative for hot flashes.  Genitourinary:  Negative for difficulty urinating and frequency.   Musculoskeletal:  Positive for back pain. Negative for arthralgias.  Skin:  Negative for itching and rash.  Neurological:  Positive for numbness. Negative for extremity weakness.  Hematological:  Negative for adenopathy.  Psychiatric/Behavioral:  Negative for confusion.     MEDICAL HISTORY:  Past Medical History:  Diagnosis Date   Anemia    Arthritis 08/17/2020   Chronic back pain    Diabetes mellitus without complication (HCC)    type 2   GERD (gastroesophageal reflux disease)    Migraine    Ovarian cyst    Pneumonia    Polyneuropathy     SURGICAL HISTORY: Past Surgical History:  Procedure Laterality Date   CESAREAN SECTION     CHOLECYSTECTOMY  2004   EYE SURGERY Left    cataract   IR BONE MARROW BIOPSY & ASPIRATION  08/01/2023   LUMBAR LAMINECTOMY N/A 05/30/2020   Procedure: Lumbar five Laminectomy,  Bilateral Microdiscectomy, Left Lumbar five -Sacral one Microdiscectomy;  Surgeon: Barbarann Oneil BROCKS, MD;  Location: [facility] OR;  Service: Orthopedics;  Laterality: N/A;   LUMBAR LAMINECTOMY Left 11/14/2020   Procedure: LEFT LUMBAR FOUR-FIVE MICRODISCECTOMY;  Surgeon: Barbarann Oneil BROCKS, MD;  Location: [facility] OR;  Service: Orthopedics;  Laterality: Left;   TUBAL LIGATION  10/19/2010    SOCIAL HISTORY: Social History   Socioeconomic History   Marital status: Single    Spouse name: Not on file   Number of children: 3   Years of education: Not on file   Highest education level: High school graduate  Occupational History    Comment: home maker  Tobacco Use   Smoking status: Former    Types: Cigars    Quit date: 12/23/2021    Years since quitting: 1.7   Smokeless tobacco: Never   Tobacco comments:    2 cigars daily. Khj 02/12/2023  Vaping Use   Vaping status: Never Used  Substance and Sexual Activity   Alcohol  use: Not Currently   Drug use: Not Currently    Comment: per patient stopped smoking marijuana Mar 2022   Sexual activity: Yes    Birth control/protection: Surgical  Other Topics Concern   Not on file  Social History Narrative   Lives with 3 children   Some coffee   Right handed   Doesn't work   Social Drivers of Corporate investment banker Strain: Not on file  Food Insecurity: No Food Insecurity (09/05/2023)   Hunger Vital Sign    Worried About Running Out of Food in the Last Year: Never true    Ran Out of Food in the Last Year: Never true  Transportation Needs: No Transportation Needs (09/05/2023)   PRAPARE - Administrator, Civil Service (Medical): No    Lack of Transportation (Non-Medical): No  Physical Activity: Not on file  Stress: Not on file  Social Connections: Moderately Isolated (07/31/2023)   Social Connection and Isolation Panel    Frequency of Communication with Friends and Family: Twice a week    Frequency of Social Gatherings with Friends and Family: Twice a week    Attends Religious Services: 1 to 4 times per year    Active Member of [employer]  or Organizations: No    Attends Banker Meetings: Never    Marital Status: Never married  Intimate Partner Violence: Not At Risk (09/05/2023)   Humiliation, Afraid, Rape, and Kick questionnaire    Fear of Current or Ex-Partner: No    Emotionally Abused: No    Physically Abused: No    Sexually Abused: No    FAMILY HISTORY: Family History  Problem Relation Age of Onset   Diabetes Mother    Hypertension Mother    Stroke Mother     ALLERGIES:  is allergic to trulicity  [dulaglutide ].  MEDICATIONS:  Current Outpatient Medications  Medication Sig Dispense Refill   acetaminophen  (TYLENOL ) 500 MG tablet Take 1,000 mg by mouth every 6 (six) hours as needed for mild pain (pain score 1-3) or moderate pain (pain score 4-6).     albuterol  (VENTOLIN  HFA) 108 (90 Base) MCG/ACT inhaler Inhale 2 puffs into the lungs every 6 (six) hours as needed for wheezing or shortness of breath. 18 g 2   amitriptyline  (ELAVIL ) 25 MG tablet Take 1 tablet (25 mg total) by mouth at bedtime. 30 tablet 6   Continuous Glucose Sensor (DEXCOM G7 SENSOR) MISC 1 Device by Does not apply route as directed. Change sensor every 10 days (Patient taking differently: Inject 1 Device into the skin See admin instructions. Change sensor every 10 days) 9 each 3   Cyanocobalamin  (VITAMIN B-12) 2500 MCG SUBL Place 1 tablet (2,500 mcg total) under the tongue daily. 30 tablet 2   dexamethasone  (DECADRON ) 4 MG tablet Take 10 tablets (40 mg total) by mouth daily. 40 tablet 0   ferrous sulfate  325 (65 FE) MG tablet Take 1 tablet (325 mg total) by mouth daily with breakfast. 30 tablet 0   furosemide  (LASIX ) 20 MG tablet Take 1 tablet (20 mg total) by mouth in the morning. 30 tablet 3   gabapentin  (NEURONTIN ) 800 MG tablet Take 800 mg by mouth 3 (three) times daily.     glipiZIDE  (GLUCOTROL ) 10 MG tablet Take 2 tablets (20 mg total) by mouth daily before breakfast AND 1 tablet (10 mg total) daily before supper. 270 tablet 3    insulin  glargine (LANTUS  SOLOSTAR) 100 UNIT/ML  Solostar Pen Inject 40 Units into the skin daily before breakfast. 45 mL 2   losartan  (COZAAR ) 25 MG tablet Take 1 tablet (25 mg total) by mouth daily. 30 tablet 3   megestrol  (MEGACE ) 40 MG tablet Take 2 tablets (80 mg total) by mouth at bedtime for 24 days. 48 tablet 0   metoprolol  tartrate (LOPRESSOR ) 25 MG tablet Take 1 tablet (25 mg total) by mouth 2 (two) times daily. 60 tablet 0   mometasone -formoterol  (DULERA ) 100-5 MCG/ACT AERO Inhale 2 puffs into the lungs 2 (two) times daily at 10 AM and 5 PM. 13 g 0   Multiple Vitamin (MULTIVITAMIN WITH MINERALS) TABS tablet Take 1 tablet by mouth daily.     oxyCODONE -acetaminophen  (PERCOCET/ROXICET) 5-325 MG tablet Take 1 tablet by mouth every 6 (six) hours as needed for moderate pain (pain score 4-6) or severe pain (pain score 7-10).     pantoprazole  (PROTONIX ) 40 MG tablet Take 1 tablet (40 mg total) by mouth daily. 30 tablet 0   Rimegepant Sulfate (NURTEC) 75 MG TBDP Take 1 tablet (75 mg total) by mouth as needed. Take 1 tablet at onset of headache, max is 1 tablet in 24 hours. 8 tablet 11   tiZANidine  (ZANAFLEX ) 4 MG tablet Take 4 mg by mouth every 12 (twelve) hours as needed for muscle spasms.     vitamin C  (ASCORBIC ACID ) 250 MG tablet Take 2 tablets (500 mg total) by mouth daily. 180 tablet 0   Galcanezumab -gnlm (EMGALITY ) 120 MG/ML SOAJ Inject 120 mg into the skin every 30 (thirty) days. (Patient not taking: Reported on 09/10/2023) 1.12 mL 11   No current facility-administered medications for this visit.     PHYSICAL EXAMINATION: ECOG PERFORMANCE STATUS: 2 - Symptomatic, <50% confined to bed Vitals:   09/10/23 0840  BP: (!) 152/72  Pulse: 84  Resp: 16  Temp: 97.8 F (36.6 C)  SpO2: 100%   Filed Weights   09/10/23 0840  Weight: 217 lb 6.4 oz (98.6 kg)      Physical Exam Constitutional:      General: She is not in acute distress.    Comments: Patient sits in the wheelchair  HENT:      Head: Normocephalic and atraumatic.   Eyes:     General: No scleral icterus.   Cardiovascular:     Rate and Rhythm: Normal rate and regular rhythm.     Heart sounds: Normal heart sounds.  Pulmonary:     Effort: Pulmonary effort is normal. No respiratory distress.     Breath sounds: No wheezing.  Abdominal:     General: Bowel sounds are normal. There is no distension.     Palpations: Abdomen is soft.   Musculoskeletal:        General: No deformity. Normal range of motion.     Cervical back: Normal range of motion and neck supple.   Skin:    General: Skin is warm and dry.     Coloration: Skin is not pale.     Findings: No erythema or rash.   Neurological:     Mental Status: She is alert and oriented to person, place, and time. Mental status is at baseline.   Psychiatric:        Mood and Affect: Mood normal.     LABORATORY DATA:  I have reviewed the data as listed    Latest Ref Rng & Units 09/09/2023   10:55 AM 09/06/2023    5:29 AM 09/05/2023    5:17  PM  CBC  WBC 4.0 - 10.5 K/uL 7.8  7.2  8.1   Hemoglobin 12.0 - 15.0 g/dL 7.7  9.1  6.7   Hematocrit 36.0 - 46.0 % 22.0  26.8  19.9   Platelets 150 - 400 K/uL 132  135  158       Latest Ref Rng & Units 09/06/2023   10:29 AM 09/06/2023    5:29 AM 09/05/2023    5:17 PM  CMP  Glucose 70 - 99 mg/dL  758  774   BUN 6 - 20 mg/dL  22  25   Creatinine 9.55 - 1.00 mg/dL  8.85  8.48   Sodium 864 - 145 mmol/L  137  135   Potassium 3.5 - 5.1 mmol/L  4.0  4.1   Chloride 98 - 111 mmol/L  112  111   CO2 22 - 32 mmol/L  18  15   Calcium  8.9 - 10.3 mg/dL  8.6  8.3   Total Protein 6.5 - 8.1 g/dL 6.1     Total Bilirubin 0.0 - 1.2 mg/dL 1.3     Alkaline Phos 38 - 126 U/L 113     AST 15 - 41 U/L 11     ALT 0 - 44 U/L 8        Iron /TIBC/Ferritin/ %Sat    Component Value Date/Time   IRON  90 07/23/2023 1548   IRON  64 01/02/2019 0955   TIBC 353 07/23/2023 1548   TIBC 382 01/02/2019 0955   FERRITIN 206 07/23/2023 1548    FERRITIN 21 01/02/2019 0955   IRONPCTSAT 26 07/23/2023 1548   IRONPCTSAT 17 01/02/2019 0955      RADIOGRAPHIC STUDIES: I have personally reviewed the radiological images as listed and agreed with the findings in the report. No results found.

## 2023-09-10 NOTE — Assessment & Plan Note (Signed)
 S/p vitamin B12 injections, she did not tolerate due to muscle cramps.  B12 level stable continue sublingual B12 daily.

## 2023-09-11 LAB — TYPE AND SCREEN
ABO/RH(D): A POS
Antibody Screen: NEGATIVE
Unit division: 0

## 2023-09-11 LAB — RHEUMATOID FACTOR: Rheumatoid fact SerPl-aCnc: <10 [IU]/mL (ref <14.0)

## 2023-09-11 LAB — ANA W/REFLEX: Anti Nuclear Antibody (ANA): NEGATIVE

## 2023-09-11 LAB — BPAM RBC
Blood Product Expiration Date: 202507242359
ISSUE DATE / TIME: 202506241028
Unit Type and Rh: 6200

## 2023-09-11 LAB — CYCLIC CITRUL PEPTIDE ANTIBODY, IGG/IGA: CCP Antibodies IgG/IgA: 10 U (ref 0–19)

## 2023-09-11 LAB — COLD AGGLUTININ TITER: Cold Agglutinin Titer: NEGATIVE

## 2023-09-11 LAB — GLUCOSE 6 PHOSPHATE DEHYDROGENASE
G6PDH: 12.3 U/g{Hb} (ref 4.7–14.6)
Hemoglobin: 8.3 g/dL — ABNORMAL LOW (ref 11.1–15.9)

## 2023-09-13 ENCOUNTER — Other Ambulatory Visit: Payer: Self-pay | Admitting: Obstetrics and Gynecology

## 2023-09-13 DIAGNOSIS — Z01812 Encounter for preprocedural laboratory examination: Secondary | ICD-10-CM

## 2023-09-23 ENCOUNTER — Encounter (HOSPITAL_COMMUNITY): Payer: Self-pay | Admitting: Obstetrics and Gynecology

## 2023-09-23 ENCOUNTER — Telehealth: Payer: Self-pay

## 2023-09-23 ENCOUNTER — Other Ambulatory Visit: Payer: Self-pay | Admitting: Oncology

## 2023-09-23 ENCOUNTER — Other Ambulatory Visit: Payer: Self-pay

## 2023-09-23 ENCOUNTER — Inpatient Hospital Stay: Attending: Oncology

## 2023-09-23 DIAGNOSIS — E538 Deficiency of other specified B group vitamins: Secondary | ICD-10-CM | POA: Insufficient documentation

## 2023-09-23 DIAGNOSIS — D519 Vitamin B12 deficiency anemia, unspecified: Secondary | ICD-10-CM

## 2023-09-23 DIAGNOSIS — D649 Anemia, unspecified: Secondary | ICD-10-CM

## 2023-09-23 DIAGNOSIS — D696 Thrombocytopenia, unspecified: Secondary | ICD-10-CM | POA: Insufficient documentation

## 2023-09-23 DIAGNOSIS — D509 Iron deficiency anemia, unspecified: Secondary | ICD-10-CM | POA: Insufficient documentation

## 2023-09-23 LAB — RETIC PANEL
Immature Retic Fract: 10 % (ref 2.3–15.9)
RBC.: 2.39 MIL/uL — ABNORMAL LOW (ref 3.87–5.11)
Retic Count, Absolute: 46.4 K/uL (ref 19.0–186.0)
Retic Ct Pct: 1.9 % (ref 0.4–3.1)
Reticulocyte Hemoglobin: 30.8 pg (ref >27.9)

## 2023-09-23 LAB — CBC WITH DIFFERENTIAL (CANCER CENTER ONLY)
Abs Immature Granulocytes: 0.07 K/uL (ref 0.00–0.07)
Basophils Absolute: 0.1 K/uL (ref 0.0–0.1)
Basophils Relative: 1 %
Eosinophils Absolute: 0.4 K/uL (ref 0.0–0.5)
Eosinophils Relative: 4 %
HCT: 19.7 % — ABNORMAL LOW (ref 36.0–46.0)
Hemoglobin: 6.8 g/dL — CL (ref 12.0–15.0)
Immature Granulocytes: 1 %
Lymphocytes Relative: 16 %
Lymphs Abs: 1.6 K/uL (ref 0.7–4.0)
MCH: 28 pg (ref 26.0–34.0)
MCHC: 34.5 g/dL (ref 30.0–36.0)
MCV: 81.1 fL (ref 80.0–100.0)
Monocytes Absolute: 0.6 K/uL (ref 0.1–1.0)
Monocytes Relative: 5 %
Neutro Abs: 7.6 K/uL (ref 1.7–7.7)
Neutrophils Relative %: 73 %
Platelet Count: 148 K/uL — ABNORMAL LOW (ref 150–400)
RBC: 2.43 MIL/uL — ABNORMAL LOW (ref 3.87–5.11)
RDW: 16.3 % — ABNORMAL HIGH (ref 11.5–15.5)
WBC Count: 10.2 K/uL (ref 4.0–10.5)
nRBC: 0 % (ref 0.0–0.2)

## 2023-09-23 LAB — PREPARE RBC (CROSSMATCH)

## 2023-09-23 MED ORDER — PREDNISONE 20 MG PO TABS: 40.0000 mg | ORAL_TABLET | Freq: Every day | ORAL | 0 refills | Status: DC

## 2023-09-23 NOTE — Telephone Encounter (Signed)
 Critical lab received from [place] [facility] [place] lab. Hemoglobin 6.8. MD notified. Pt scheduled to get blood tranfusion on 7/8.

## 2023-09-23 NOTE — Progress Notes (Signed)
 Spoke w/ via phone for pre-op interview--- Melinda Stone needs dos----  CBC and T&S per Careers adviser. UPT, BMP, CBG and A1C per anesthesia.       Stone results------ Current EKG in Epic dated 09/09/23. COVID test -----patient states asymptomatic no test needed Arrive at -------0800 NPO after MN NO Solid Food. Pre-Surgery Ensure or G2:  Med rec completed Medications to take morning of surgery -----Albuterol -bring. Dulera . Metoprolol , Prednisone , Protonix , Oxycodone  PRN.  Diabetic medication ----- None AM of surgery. 1/2 insulin  dose night before, pt states only takes insulin  in the morning.  GLP1 agonist last dose: GLP1 instructions:  Patient instructed no nail polish to be worn day of surgery Patient instructed to bring photo id and insurance card day of surgery Patient aware to have Driver (ride ) / caregiver    for 24 hours after surgery - Daughter Melinda Stone Patient Special Instructions ----- Shower with antibacterial soap. Bring extra Dexcom supplies in case current on comes off. Pre-Op special Instructions -----  Patient verbalized understanding of instructions that were given at this phone interview. Patient denies chest pain, sob, fever, cough at the interview.

## 2023-09-23 NOTE — Telephone Encounter (Signed)
 Called pt and confirmed that she completed Dexamethasone  course given last week. Also informed her that Dr. Babara would like for her to continue prednisone  40mg  daily, she has sent tx. Per Dr. Babara, we recheck labs on 7/11 and poss blood on 7/14.

## 2023-09-24 ENCOUNTER — Other Ambulatory Visit: Payer: Self-pay

## 2023-09-24 ENCOUNTER — Inpatient Hospital Stay

## 2023-09-24 VITALS — BP 182/99 | HR 93 | Temp 97.0°F | Resp 17

## 2023-09-24 DIAGNOSIS — D509 Iron deficiency anemia, unspecified: Secondary | ICD-10-CM | POA: Diagnosis not present

## 2023-09-24 DIAGNOSIS — D649 Anemia, unspecified: Secondary | ICD-10-CM

## 2023-09-24 MED ORDER — ACETAMINOPHEN 325 MG PO TABS
650.0000 mg | ORAL_TABLET | Freq: Once | ORAL | Status: AC
  Administered 2023-09-24: 650 mg via ORAL
  Filled 2023-09-24: qty 2

## 2023-09-24 MED ORDER — DIPHENHYDRAMINE HCL 25 MG PO CAPS: 25.0000 mg | ORAL_CAPSULE | Freq: Once | ORAL | Status: DC

## 2023-09-24 MED ORDER — SODIUM CHLORIDE 0.9% IV SOLUTION
250.0000 mL | INTRAVENOUS | Status: DC
  Administered 2023-09-24: 250 mL via INTRAVENOUS
  Filled 2023-09-24: qty 250

## 2023-09-24 MED ORDER — DIPHENHYDRAMINE HCL 25 MG PO CAPS
25.0000 mg | ORAL_CAPSULE | Freq: Once | ORAL | Status: AC
  Administered 2023-09-24: 25 mg via ORAL
  Filled 2023-09-24: qty 1

## 2023-09-24 MED ORDER — ACETAMINOPHEN 325 MG PO TABS: 650.0000 mg | ORAL_TABLET | Freq: Once | ORAL | Status: DC

## 2023-09-24 MED ORDER — SODIUM CHLORIDE 0.9% FLUSH
10.0000 mL | INTRAVENOUS | Status: AC | PRN
  Administered 2023-09-24: 10 mL
  Filled 2023-09-24: qty 10

## 2023-09-24 NOTE — Patient Instructions (Signed)

## 2023-09-24 NOTE — Progress Notes (Signed)
 Called and spoke w/ patient to inform her of new surgery start time of 0830 and arrival time of 0630.  Pt verbalized understanding to arrival at 0630 at [facility] be npo after midnight.

## 2023-09-25 LAB — BPAM RBC
Blood Product Expiration Date: 202508042359
ISSUE DATE / TIME: 202507080945
Unit Type and Rh: 202508042359
Unit Type and Rh: 6200

## 2023-09-25 LAB — TYPE AND SCREEN
ABO/RH(D): A POS
Antibody Screen: NEGATIVE
Unit division: 0

## 2023-09-26 ENCOUNTER — Encounter: Payer: Self-pay | Admitting: Internal Medicine

## 2023-09-26 ENCOUNTER — Ambulatory Visit (INDEPENDENT_AMBULATORY_CARE_PROVIDER_SITE_OTHER): Admitting: Internal Medicine

## 2023-09-26 VITALS — BP 122/80 | HR 99 | Ht 69.0 in | Wt 224.0 lb

## 2023-09-26 DIAGNOSIS — E1142 Type 2 diabetes mellitus with diabetic polyneuropathy: Secondary | ICD-10-CM

## 2023-09-26 DIAGNOSIS — E1165 Type 2 diabetes mellitus with hyperglycemia: Secondary | ICD-10-CM

## 2023-09-26 DIAGNOSIS — E11319 Type 2 diabetes mellitus with unspecified diabetic retinopathy without macular edema: Secondary | ICD-10-CM

## 2023-09-26 DIAGNOSIS — Z794 Long term (current) use of insulin: Secondary | ICD-10-CM

## 2023-09-26 LAB — POCT GLYCOSYLATED HEMOGLOBIN (HGB A1C): Hemoglobin A1C: 8.7 % — AB (ref 4.0–5.6)

## 2023-09-26 MED ORDER — INSULIN LISPRO (1 UNIT DIAL) 100 UNIT/ML (KWIKPEN): 12.0000 [IU] | PEN_INJECTOR | Freq: Three times a day (TID) | SUBCUTANEOUS | 4 refills | Status: DC

## 2023-09-26 MED ORDER — LANTUS SOLOSTAR 100 UNIT/ML ~~LOC~~ SOPN: 45.0000 [IU] | PEN_INJECTOR | Freq: Every day | SUBCUTANEOUS | 3 refills | Status: DC

## 2023-09-26 MED ORDER — OMNIPOD 5 G7 INTRO (GEN 5) KIT
1.0000 | PACK | 0 refills | Status: DC
Start: 2023-09-26 — End: 2023-11-25

## 2023-09-26 MED ORDER — OMNIPOD 5 G7 PODS (GEN 5) MISC: 1.0000 | 3 refills | Status: DC

## 2023-09-26 MED ORDER — INSULIN PEN NEEDLE 32G X 4 MM MISC: 1.0000 | Freq: Four times a day (QID) | 3 refills | Status: DC

## 2023-09-26 NOTE — Progress Notes (Addendum)
 Name: Melinda Stone  Age/ Sex: 43 y.o., female   MRN/ DOB: [id number], [birth year]     PCP: [name], NP   Reason for Endocrinology Evaluation: Type 2 Diabetes Mellitus  Initial Endocrine Consultative Visit: 12/03/2018    PATIENT IDENTIFIER: [name] is a 43 y.o. female with a past medical history of T2DM. The patient has followed with Endocrinology clinic since 12/03/2018 for consultative assistance with management of her diabetes.  DIABETIC HISTORY:  Ms. Mericle was diagnosed with DMat age 74. Metformin  had caused GI issues and hypoglycemia. Her hemoglobin A1c has ranged from 7.0% in 04/2018, peaking at 7.8% in 10/2018  On her initial visit to our clinic she had an A1c of 7.8%. She was on Jardiance , she had a prescription for Trulicity  and Jardiance  were too costly for her. We added glipizide  .    Atteted to  Jardiance  09/2020 but she did not pick it up  Trulicity  started 2022 but by 08/2021 she developed GI side effects and we stopped  Developed abdominal pain to Ozempic  09/2022  Jardiance  discontinued due to DKA in 2024   She was discharged on insulin  after hospitalization for complicated pneumonia requiring intubation and ICU admission 04/2023  SUBJECTIVE:   During the last visit (06/21/2023): A1c 5.0% .    Today (09/26/2023): [name] is here for a follow up on diabetes.  She checks her blood sugars occasionally . The patient has had hypoglycemic episodes since the last clinic visit.     She is scheduled for endometrial ablation through gynecology on 10/01/2023  She continues to receive blood transfusion due to anemia last transfusion was 09/24/2023  She is on glucocorticoids   Continues with chronic abdominal pain and had nausea last night She is S/O EGD, follows with GI  Denies constipation or diarrhea    HOME DIABETES REGIMEN:  Glipizide  10 mg, 2 tabs before breakfast and 1 tab before Supper  Lantus  40 units daily -has been taking  50 units daily   CONTINUOUS GLUCOSE MONITORING RECORD INTERPRETATION    Dates of Recording: 6/27-7/12/2023  Sensor description:dexcom  Results statistics:   CGM use % of time 85  Average and SD 247/81  Time in range   22 %  % Time Above 180 27  % Time above 250 51  % Time Below target 0   Glycemic patterns summary: BG's are high throughout the day and night  Hyperglycemic episodes all day and night  Hypoglycemic episodes occurred N/A  Overnight periods: Mostly high   DIABETIC COMPLICATIONS: Microvascular complications:   B/L retinopathy , S/P injections  Denies: CKD, neuropathy  Last eye exam: Completed 08/2021   Macrovascular complications:    Denies: CAD, PVD, CVA   HISTORY:  Past Medical History:  Past Medical History:  Diagnosis Date   Anemia    Arthritis 08/17/2020   Chronic back pain    Diabetes mellitus without complication (HCC)    type 2   GERD (gastroesophageal reflux disease)    Hypertension    Migraine    Ovarian cyst    Pneumonia    Polyneuropathy    Past Surgical History:  Past Surgical History:  Procedure Laterality Date   CESAREAN SECTION     CHOLECYSTECTOMY  2004   EYE SURGERY Left    cataract   IR BONE MARROW BIOPSY & ASPIRATION  08/01/2023   LUMBAR LAMINECTOMY N/A 05/30/2020   Procedure: Lumbar five Laminectomy,  Bilateral Microdiscectomy, Left Lumbar five -Sacral one Microdiscectomy;  Surgeon: Barbarann Oneil BROCKS, MD;  Location: [facility] OR;  Service: Orthopedics;  Laterality: N/A;   LUMBAR LAMINECTOMY Left 11/14/2020   Procedure: LEFT LUMBAR FOUR-FIVE MICRODISCECTOMY;  Surgeon: Barbarann Oneil BROCKS, MD;  Location: [facility] OR;  Service: Orthopedics;  Laterality: Left;   TUBAL LIGATION  10/19/2010   Social History:  reports that she quit smoking about 21 months ago. Her smoking use included cigars. She has never used smokeless tobacco. She reports that she does not currently use alcohol . She reports that she does not currently use drugs. Family History:   Family History  Problem Relation Age of Onset   Diabetes Mother    Hypertension Mother    Stroke Mother      HOME MEDICATIONS: Allergies as of 09/26/2023       Reactions   Trulicity  [dulaglutide ] Diarrhea        Medication List        Accurate as of September 26, 2023  8:56 AM. If you have any questions, ask your nurse or doctor.          acetaminophen  500 MG tablet Commonly known as: TYLENOL  Take 1,000 mg by mouth every 6 (six) hours as needed for mild pain (pain score 1-3) or moderate pain (pain score 4-6).   amitriptyline  25 MG tablet Commonly known as: ELAVIL  Take 1 tablet (25 mg total) by mouth at bedtime.   Dexcom G7 Sensor Misc 1 Device by Does not apply route as directed. Change sensor every 10 days What changed:  how to take this when to take this   Dulera  100-5 MCG/ACT Aero Generic drug: mometasone -formoterol  Inhale 2 puffs into the lungs 2 (two) times daily at 10 AM and 5 PM.   Emgality  120 MG/ML Soaj Generic drug: Galcanezumab -gnlm Inject 120 mg into the skin every 30 (thirty) days.   FeroSul 325 (65 Fe) MG tablet Generic drug: ferrous sulfate  Take 1 tablet (325 mg total) by mouth daily with breakfast.   furosemide  20 MG tablet Commonly known as: LASIX  Take 1 tablet (20 mg total) by mouth in the morning.   gabapentin  800 MG tablet Commonly known as: NEURONTIN  Take 800 mg by mouth 3 (three) times daily.   glipiZIDE  10 MG tablet Commonly known as: GLUCOTROL  Take 2 tablets (20 mg total) by mouth daily before breakfast AND 1 tablet (10 mg total) daily before supper.   Lantus  SoloStar 100 UNIT/ML Solostar Pen Generic drug: insulin  glargine Inject 40 Units into the skin daily before breakfast.   losartan  25 MG tablet Commonly known as: COZAAR  Take 1 tablet (25 mg total) by mouth daily.   megestrol  40 MG tablet Commonly known as: MEGACE  Take 2 tablets (80 mg total) by mouth at bedtime for 24 days.   metoprolol  tartrate 25 MG  tablet Commonly known as: LOPRESSOR  Take 1 tablet (25 mg total) by mouth 2 (two) times daily.   multivitamin with minerals Tabs tablet Take 1 tablet by mouth daily.   Nurtec 75 MG Tbdp Generic drug: Rimegepant Sulfate Take 1 tablet (75 mg total) by mouth as needed. Take 1 tablet at onset of headache, max is 1 tablet in 24 hours.   oxyCODONE -acetaminophen  5-325 MG tablet Commonly known as: PERCOCET/ROXICET Take 1 tablet by mouth every 6 (six) hours as needed for moderate pain (pain score 4-6) or severe pain (pain score 7-10).   pantoprazole  40 MG tablet Commonly known as: PROTONIX  Take 1 tablet (40 mg total) by mouth daily.   predniSONE  20 MG tablet Commonly known as:  DELTASONE  Take 2 tablets (40 mg total) by mouth daily with breakfast.   tiZANidine  4 MG tablet Commonly known as: ZANAFLEX  Take 4 mg by mouth every 12 (twelve) hours as needed for muscle spasms.   Ventolin  HFA 108 (90 Base) MCG/ACT inhaler Generic drug: albuterol  Inhale 2 puffs into the lungs every 6 (six) hours as needed for wheezing or shortness of breath.   Vitamin B-12 2500 MCG Subl Place 1 tablet (2,500 mcg total) under the tongue daily.   vitamin C  250 MG tablet Commonly known as: ASCORBIC ACID  Take 2 tablets (500 mg total) by mouth daily.         OBJECTIVE:   Vital Signs: BP 122/80 (BP Location: Left Arm, Patient Position: Sitting, Cuff Size: Normal)   Pulse 99   Ht 5' 9 (1.753 m)   Wt 224 lb (101.6 kg)   LMP 09/04/2023   SpO2 97%   BMI 33.08 kg/m   Wt Readings from Last 3 Encounters:  09/26/23 224 lb (101.6 kg)  09/10/23 217 lb 6.4 oz (98.6 kg)  09/05/23 216 lb (98 kg)     Exam: General: Pt appears drowsy, c/o arthralgia   Lungs: Clear with good BS bilat   Heart: RRR   Extremities: No  pretibial edema.   Neuro: MS is good with appropriate affect, pt is alert and Ox3    DM foot exam: 09/26/2023 The skin of the feet is intact without sores or ulcerations. The pedal pulses are 2+  on right and 2+ on left. The sensation is absent to a screening 5.07, 10 gram monofilament bilaterally     DATA REVIEWED:  Lab Results  Component Value Date   HGBA1C 8.7 (A) 09/26/2023   HGBA1C 4.6 (L) 07/22/2023   HGBA1C 5.0 05/11/2023     Latest Reference Range & Units 06/11/23 14:50  Sodium 135 - 145 mmol/L 133 (L)  Potassium 3.5 - 5.1 mmol/L 4.4  Chloride 98 - 111 mmol/L 105  CO2 22 - 32 mmol/L 21 (L)  Glucose 70 - 99 mg/dL 659 (H)  BUN 6 - 20 mg/dL 20  Creatinine 9.55 - 8.99 mg/dL 8.94 (H)  Calcium  8.9 - 10.3 mg/dL 8.1 (L)  Anion gap 5 - 15  7  Alkaline Phosphatase 38 - 126 U/L 90  Albumin 3.5 - 5.0 g/dL 2.9 (L)  AST 15 - 41 U/L 15  ALT 0 - 44 U/L 13  Total Protein 6.5 - 8.1 g/dL 5.8 (L)  Total Bilirubin 0.0 - 1.2 mg/dL 1.0  GFR, Estimated >39 mL/min >60    ASSESSMENT / PLAN / RECOMMENDATIONS:   1) Type 2 Diabetes Mellitus, With Neuropathic and retinopathic complications - Most recent A1c of 8.7  %. Goal A1c < 7.0 %.     -Historically her A1c skewed due to iron  infusions -Intolerant to Trulicity  and Ozempic  due to abdominal pain -Developed DKA on Jardiance  2024 -She has been on insulin  since 04/2023 following hospitalization - Unfortunately, she continues with persistent hyperglycemia, she is currently on prednisone  and has increased her Lantus  from 40 units to 50 units a day - I have recommended discontinuing glipizide  and starting her on prandial dose of insulin  - I have also recommended decreasing Lantus  as below - A prescription for OmniPod has been sent to the pharmacy and a referral to our CDE for training has been placed   MEDICATIONS: Stop glipizide  10 mg 2 tabs Before Breakfast and 1 tablet  before Supper  Decrease Lantus  45 units daily Start Humalog   12 unitsTIDQAC CF: Humalog  (BG-130/25) TIDQAC   EDUCATION / INSTRUCTIONS: BG monitoring instructions: Patient is instructed to check her blood sugars 2 times a day, fasting and supper time. Call  [place] if: BG persistently < 70  I reviewed the Rule of 15 for the treatment of hypoglycemia in detail with the patient. Literature supplied.    F/U in 3 months    Signed electronically by: Stefano Redgie Butts, MD  [facility] Endocrinology  [facility] [address] [place] [place], [place] [address] Phone: [phone] FAX: [phone]   CC: [name], NP [address] [address] [place] [address] Phone: [phone]  Fax: [phone]  Return to Endocrinology clinic as below: Future Appointments  Date Time Provider Department Center  09/27/2023 11:00 AM [facility] LAB [facility] None  09/30/2023  8:15 AM [facility] INFUSION CHAIR 13 [facility] None  10/28/2023  1:30 PM [facility] None  10/28/2023  1:45 PM Babara Call, MD [facility] None  10/29/2023  9:00 AM [facility] INFUSION CHAIR 11 [facility] None  02/19/2024  3:45 PM Gayland Lauraine PARAS, NP [facility] None

## 2023-09-26 NOTE — Patient Instructions (Addendum)
-   STOP Glipizide   - Lantus  45 units daily - Start Humalog  12 units with each meal  -Novolog  correctional insulin : ADD extra units on insulin  to your meal-time Novolog  dose if your blood sugars are higher than 155. Use the scale below to help guide you:   Blood sugar before meal Number of units to inject  Less than 155 0 unit  156 -  180 1 units  181 -  205 2 units  206 -  230 3 units  231 -  255 4 units  256 -  280 5 units  281 -  305 6 units  306 -  330 7 units  331 -  355 8 units  356 - 380 9 units       HOW TO TREAT LOW BLOOD SUGARS (Blood sugar LESS THAN 70 MG/DL) Please follow the RULE OF 15 for the treatment of hypoglycemia treatment (when your (blood sugars are less than 70 mg/dL)   STEP 1: Take 15 grams of carbohydrates when your blood sugar is low, which includes:  3-4 GLUCOSE TABS  OR 3-4 OZ OF JUICE OR REGULAR SODA OR ONE TUBE OF GLUCOSE GEL    STEP 2: RECHECK blood sugar in 15 MINUTES STEP 3: If your blood sugar is still low at the 15 minute recheck --> then, go back to STEP 1 and treat AGAIN with another 15 grams of carbohydrates.

## 2023-09-27 ENCOUNTER — Inpatient Hospital Stay

## 2023-09-27 ENCOUNTER — Other Ambulatory Visit: Payer: Self-pay

## 2023-09-27 ENCOUNTER — Telehealth: Payer: Self-pay

## 2023-09-27 ENCOUNTER — Other Ambulatory Visit: Payer: Self-pay | Admitting: Oncology

## 2023-09-27 DIAGNOSIS — D509 Iron deficiency anemia, unspecified: Secondary | ICD-10-CM | POA: Diagnosis not present

## 2023-09-27 DIAGNOSIS — D649 Anemia, unspecified: Secondary | ICD-10-CM

## 2023-09-27 LAB — CBC WITH DIFFERENTIAL (CANCER CENTER ONLY)
Abs Immature Granulocytes: 0.07 K/uL (ref 0.00–0.07)
Basophils Absolute: 0.1 K/uL (ref 0.0–0.1)
Basophils Relative: 0 %
Eosinophils Absolute: 0.3 K/uL (ref 0.0–0.5)
Eosinophils Relative: 2 %
HCT: 21.9 % — ABNORMAL LOW (ref 36.0–46.0)
Hemoglobin: 7.5 g/dL — ABNORMAL LOW (ref 12.0–15.0)
Immature Granulocytes: 1 %
Lymphocytes Relative: 12 %
Lymphs Abs: 1.4 K/uL (ref 0.7–4.0)
MCH: 28.5 pg (ref 26.0–34.0)
MCHC: 34.2 g/dL (ref 30.0–36.0)
MCV: 83.3 fL (ref 80.0–100.0)
Monocytes Absolute: 0.6 K/uL (ref 0.1–1.0)
Monocytes Relative: 5 %
Neutro Abs: 9.9 K/uL — ABNORMAL HIGH (ref 1.7–7.7)
Neutrophils Relative %: 80 %
Platelet Count: 120 K/uL — ABNORMAL LOW (ref 150–400)
RBC: 2.63 MIL/uL — ABNORMAL LOW (ref 3.87–5.11)
RDW: 17.7 % — ABNORMAL HIGH (ref 11.5–15.5)
WBC Count: 12.3 K/uL — ABNORMAL HIGH (ref 4.0–10.5)
nRBC: 0 % (ref 0.0–0.2)

## 2023-09-27 LAB — PREPARE RBC (CROSSMATCH)

## 2023-09-27 LAB — RETIC PANEL
Immature Retic Fract: 16 % — ABNORMAL HIGH (ref 2.3–15.9)
RBC.: 2.64 MIL/uL — ABNORMAL LOW (ref 3.87–5.11)
Retic Count, Absolute: 140.4 K/uL (ref 19.0–186.0)
Retic Ct Pct: 5.3 % — ABNORMAL HIGH (ref 0.4–3.1)
Reticulocyte Hemoglobin: 33.7 pg (ref >27.9)

## 2023-09-27 LAB — SAMPLE TO BLOOD BANK

## 2023-09-27 LAB — HEPATIC FUNCTION PANEL
ALT: 16 U/L (ref 0–44)
AST: 15 U/L (ref 15–41)
Albumin: 2.9 g/dL — ABNORMAL LOW (ref 3.5–5.0)
Alkaline Phosphatase: 105 U/L (ref 38–126)
Bilirubin, Direct: 0.1 mg/dL (ref 0.0–0.2)
Indirect Bilirubin: 0.8 mg/dL (ref 0.3–0.9)
Total Bilirubin: 0.9 mg/dL (ref 0.0–1.2)
Total Protein: 5.9 g/dL — ABNORMAL LOW (ref 6.5–8.1)

## 2023-09-27 LAB — MICROALBUMIN / CREATININE URINE RATIO
Creatinine, Urine: 79 mg/dL (ref 20–275)
Microalb Creat Ratio: 2795 mg/g{creat} — ABNORMAL HIGH (ref <30)
Microalb, Ur: 220.8 mg/dL

## 2023-09-27 NOTE — Telephone Encounter (Signed)
 Please schedule lab on 7/21 (cbc, hold tube)/ poss blood 7/22

## 2023-09-30 ENCOUNTER — Ambulatory Visit: Payer: Self-pay | Admitting: Internal Medicine

## 2023-09-30 ENCOUNTER — Other Ambulatory Visit

## 2023-09-30 ENCOUNTER — Inpatient Hospital Stay

## 2023-09-30 DIAGNOSIS — E1129 Type 2 diabetes mellitus with other diabetic kidney complication: Secondary | ICD-10-CM

## 2023-09-30 DIAGNOSIS — D649 Anemia, unspecified: Secondary | ICD-10-CM

## 2023-09-30 DIAGNOSIS — D509 Iron deficiency anemia, unspecified: Secondary | ICD-10-CM | POA: Diagnosis not present

## 2023-09-30 MED ORDER — DIPHENHYDRAMINE HCL 25 MG PO CAPS
25.0000 mg | ORAL_CAPSULE | Freq: Once | ORAL | Status: AC
  Administered 2023-09-30: 25 mg via ORAL
  Filled 2023-09-30: qty 1

## 2023-09-30 MED ORDER — SODIUM CHLORIDE 0.9% IV SOLUTION
250.0000 mL | INTRAVENOUS | Status: DC
  Filled 2023-09-30: qty 250

## 2023-09-30 MED ORDER — ACETAMINOPHEN 325 MG PO TABS
650.0000 mg | ORAL_TABLET | Freq: Once | ORAL | Status: AC
  Administered 2023-09-30: 650 mg via ORAL
  Filled 2023-09-30: qty 2

## 2023-09-30 NOTE — Anesthesia Preprocedure Evaluation (Signed)
 Anesthesia Evaluation  Patient identified by MRN, date of birth, ID band Patient awake    Reviewed: Allergy & Precautions, NPO status , Patient's Chart, lab work & pertinent test results  History of Anesthesia Complications Negative for: history of anesthetic complications  Airway Mallampati: III  TM Distance: >3 FB Neck ROM: Full   Comment: Previous grade I view with MAC 3, easy mask Dental  (+) Edentulous Upper, Edentulous Lower   Pulmonary neg shortness of breath, asthma , neg COPD, neg recent URI, Patient abstained from smoking., former smoker Nodules    Pulmonary exam normal breath sounds clear to auscultation       Cardiovascular hypertension (metoprolol ), Pt. on home beta blockers (-) angina (-) Past MI, (-) Cardiac Stents and (-) CABG (-) dysrhythmias  Rhythm:Regular Rate:Normal  TTE 05/13/2023: IMPRESSIONS    1. Left ventricular ejection fraction, by estimation, is 65 to 70%. The  left ventricle has normal function. The left ventricle has no regional  wall motion abnormalities. There is moderate left ventricular hypertrophy.  Left ventricular diastolic  parameters are consistent with Grade I diastolic dysfunction (impaired  relaxation).   2. Right ventricular systolic function is normal. The right ventricular  size is normal. Tricuspid regurgitation signal is inadequate for assessing  PA pressure.   3. The mitral valve is normal in structure. Trivial mitral valve  regurgitation. No evidence of mitral stenosis.   4. The aortic valve is tricuspid. Aortic valve regurgitation is not  visualized. Aortic valve sclerosis/calcification is present, without any  evidence of aortic stenosis.   5. The inferior vena cava is dilated in size with <50% respiratory  variability, suggesting right atrial pressure of 15 mmHg.     Neuro/Psych  Headaches, neg Seizures PSYCHIATRIC DISORDERS Anxiety     Chronic back pain  Neuromuscular  disease (sciatica, neuropathy)    GI/Hepatic ,GERD  Medicated,,(+)     substance abuse (patient states she hasn't had marijuana in 4 years)  marijuana use  Endo/Other  diabetes, Type 2, Insulin  Dependent    Renal/GU negative Renal ROS     Musculoskeletal  (+) Arthritis ,    Abdominal  (+) + obese  Peds  Hematology  (+) Blood dyscrasia, anemia Lab Results      Component                Value               Date                      WBC                      12.3 (H)            09/27/2023                HGB                      7.5 (L)             09/27/2023                HCT                      21.9 (L)            09/27/2023                MCV  83.3                09/27/2023                PLT                      120 (L)             09/27/2023              Anesthesia Other Findings Received 1 unit pRBCs yesterday   Reproductive/Obstetrics                              Anesthesia Physical Anesthesia Plan  ASA: 3  Anesthesia Plan: General   Post-op Pain Management: Tylenol  PO (pre-op)*   Induction: Intravenous  PONV Risk Score and Plan: 3 and Ondansetron , Dexamethasone , Midazolam , Scopolamine patch - Pre-op and Treatment may vary due to age or medical condition  Airway Management Planned: LMA  Additional Equipment:   Intra-op Plan:   Post-operative Plan: Extubation in OR  Informed Consent: I have reviewed the patients History and Physical, chart, labs and discussed the procedure including the risks, benefits and alternatives for the proposed anesthesia with the patient or authorized representative who has indicated his/her understanding and acceptance.     Dental advisory given  Plan Discussed with: CRNA and Anesthesiologist  Anesthesia Plan Comments: (Risks of general anesthesia discussed including, but not limited to, sore throat, hoarse voice, chipped/damaged teeth, injury to vocal cords, nausea and vomiting,  allergic reactions, lung infection, heart attack, stroke, and death. All questions answered. )         Anesthesia Quick Evaluation

## 2023-09-30 NOTE — Telephone Encounter (Signed)
 Please let the patient know that she is leaking too much protein in the urine, this is due to diabetes affecting her kidneys.    In addition to continuing to monitor and manage her diabetes, I will refer her to an nephrology for further assessment    Thanks

## 2023-10-01 ENCOUNTER — Encounter (HOSPITAL_COMMUNITY): Payer: Self-pay | Admitting: Obstetrics and Gynecology

## 2023-10-01 ENCOUNTER — Ambulatory Visit (HOSPITAL_COMMUNITY): Admitting: Anesthesiology

## 2023-10-01 ENCOUNTER — Encounter

## 2023-10-01 ENCOUNTER — Other Ambulatory Visit: Payer: Self-pay

## 2023-10-01 ENCOUNTER — Encounter (HOSPITAL_COMMUNITY)
Admission: RE | Disposition: A | Discharge status: Home / Self Care | Payer: Self-pay | Source: Home / Self Care | Attending: Obstetrics and Gynecology

## 2023-10-01 ENCOUNTER — Ambulatory Visit (HOSPITAL_COMMUNITY)
Admission: RE | Admit: 2023-10-01 | Discharge: 2023-10-01 | Disposition: A | Discharge status: Home / Self Care | Attending: Obstetrics and Gynecology | Admitting: Obstetrics and Gynecology

## 2023-10-01 DIAGNOSIS — Z79899 Other long term (current) drug therapy: Secondary | ICD-10-CM | POA: Diagnosis not present

## 2023-10-01 DIAGNOSIS — Z01812 Encounter for preprocedural laboratory examination: Secondary | ICD-10-CM

## 2023-10-01 DIAGNOSIS — I10 Essential (primary) hypertension: Secondary | ICD-10-CM | POA: Diagnosis not present

## 2023-10-01 DIAGNOSIS — N939 Abnormal uterine and vaginal bleeding, unspecified: Secondary | ICD-10-CM

## 2023-10-01 DIAGNOSIS — G8929 Other chronic pain: Secondary | ICD-10-CM | POA: Insufficient documentation

## 2023-10-01 DIAGNOSIS — E119 Type 2 diabetes mellitus without complications: Secondary | ICD-10-CM

## 2023-10-01 DIAGNOSIS — Z87891 Personal history of nicotine dependence: Secondary | ICD-10-CM | POA: Diagnosis not present

## 2023-10-01 DIAGNOSIS — K219 Gastro-esophageal reflux disease without esophagitis: Secondary | ICD-10-CM | POA: Insufficient documentation

## 2023-10-01 DIAGNOSIS — Z794 Long term (current) use of insulin: Secondary | ICD-10-CM | POA: Insufficient documentation

## 2023-10-01 DIAGNOSIS — Z01818 Encounter for other preprocedural examination: Secondary | ICD-10-CM

## 2023-10-01 DIAGNOSIS — E1165 Type 2 diabetes mellitus with hyperglycemia: Secondary | ICD-10-CM

## 2023-10-01 DIAGNOSIS — N938 Other specified abnormal uterine and vaginal bleeding: Secondary | ICD-10-CM

## 2023-10-01 DIAGNOSIS — F419 Anxiety disorder, unspecified: Secondary | ICD-10-CM | POA: Diagnosis not present

## 2023-10-01 HISTORY — PX: HYSTEROSCOPY: SHX211

## 2023-10-01 HISTORY — DX: Essential (primary) hypertension: I10

## 2023-10-01 LAB — BASIC METABOLIC PANEL WITH GFR
Anion gap: 10 (ref 5–15)
BUN: 27 mg/dL — ABNORMAL HIGH (ref 6–20)
CO2: 15 mmol/L — ABNORMAL LOW (ref 22–32)
Calcium: 8.7 mg/dL — ABNORMAL LOW (ref 8.9–10.3)
Chloride: 110 mmol/L (ref 98–111)
Creatinine, Ser: 1.21 mg/dL — ABNORMAL HIGH (ref 0.44–1.00)
GFR, Estimated: 58 mL/min — ABNORMAL LOW (ref >60)
Glucose, Bld: 286 mg/dL — ABNORMAL HIGH (ref 70–99)
Potassium: 4.6 mmol/L (ref 3.5–5.1)
Sodium: 135 mmol/L (ref 135–145)

## 2023-10-01 LAB — CBC
HCT: 28.4 % — ABNORMAL LOW (ref 36.0–46.0)
Hemoglobin: 9.4 g/dL — ABNORMAL LOW (ref 12.0–15.0)
MCH: 28.6 pg (ref 26.0–34.0)
MCHC: 33.1 g/dL (ref 30.0–36.0)
MCV: 86.3 fL (ref 80.0–100.0)
Platelets: 115 K/uL — ABNORMAL LOW (ref 150–400)
RBC: 3.29 MIL/uL — ABNORMAL LOW (ref 3.87–5.11)
RDW: 17.6 % — ABNORMAL HIGH (ref 11.5–15.5)
WBC: 10.2 K/uL (ref 4.0–10.5)
nRBC: 0 % (ref 0.0–0.2)

## 2023-10-01 LAB — HEMOGLOBIN A1C
Hgb A1c MFr Bld: 7.5 % — ABNORMAL HIGH (ref 4.8–5.6)
Mean Plasma Glucose: 168.55 mg/dL

## 2023-10-01 LAB — BPAM RBC
Blood Product Expiration Date: 202508092359
ISSUE DATE / TIME: 202507140845
Unit Type and Rh: 202508092359
Unit Type and Rh: 6200

## 2023-10-01 LAB — TYPE AND SCREEN
ABO/RH(D): A POS
ABO/RH(D): A POS
Antibody Screen: NEGATIVE
Antibody Screen: NEGATIVE
Unit division: 0

## 2023-10-01 LAB — GLUCOSE, CAPILLARY
Glucose-Capillary: 218 mg/dL — ABNORMAL HIGH (ref 70–99)
Glucose-Capillary: 241 mg/dL — ABNORMAL HIGH (ref 70–99)

## 2023-10-01 LAB — POCT PREGNANCY, URINE: Preg Test, Ur: NEGATIVE

## 2023-10-01 SURGERY — ABLATION, ENDOMETRIUM, HYSTEROSCOPIC
Anesthesia: General | Site: Uterus

## 2023-10-01 MED ORDER — PROPOFOL 10 MG/ML IV BOLUS
INTRAVENOUS | Status: DC | PRN
  Administered 2023-10-01: 200 mg via INTRAVENOUS
  Administered 2023-10-01: 150 ug/kg/min via INTRAVENOUS

## 2023-10-01 MED ORDER — ACETAMINOPHEN 500 MG PO TABS
ORAL_TABLET | ORAL | Status: DC
Start: 2023-10-01 — End: 2023-10-01
  Filled 2023-10-01: qty 2

## 2023-10-01 MED ORDER — KETOROLAC TROMETHAMINE 15 MG/ML IJ SOLN
INTRAMUSCULAR | Status: DC | PRN
  Administered 2023-10-01: 15 mg via INTRAVENOUS

## 2023-10-01 MED ORDER — DEXAMETHASONE SODIUM PHOSPHATE 10 MG/ML IJ SOLN
INTRAMUSCULAR | Status: AC
  Filled 2023-10-01: qty 1

## 2023-10-01 MED ORDER — OXYCODONE HCL 5 MG PO TABS: 5.0000 mg | ORAL_TABLET | Freq: Once | ORAL | Status: DC | PRN

## 2023-10-01 MED ORDER — LIDOCAINE 2% (20 MG/ML) 5 ML SYRINGE
INTRAMUSCULAR | Status: DC | PRN
  Administered 2023-10-01: 100 mg via INTRAVENOUS

## 2023-10-01 MED ORDER — ONDANSETRON HCL 4 MG/2ML IJ SOLN
INTRAMUSCULAR | Status: DC | PRN
  Administered 2023-10-01: 4 mg via INTRAVENOUS

## 2023-10-01 MED ORDER — FENTANYL CITRATE (PF) 100 MCG/2ML IJ SOLN
25.0000 ug | INTRAMUSCULAR | Status: DC | PRN
  Administered 2023-10-01: 50 ug via INTRAVENOUS

## 2023-10-01 MED ORDER — MIDAZOLAM HCL 2 MG/2ML IJ SOLN
INTRAMUSCULAR | Status: DC | PRN
  Administered 2023-10-01: 2 mg via INTRAVENOUS

## 2023-10-01 MED ORDER — LACTATED RINGERS IV SOLN: INTRAVENOUS | Status: DC

## 2023-10-01 MED ORDER — POVIDONE-IODINE 10 % EX SWAB: 2.0000 | Freq: Once | CUTANEOUS | Status: DC

## 2023-10-01 MED ORDER — MIDAZOLAM HCL 2 MG/2ML IJ SOLN
INTRAMUSCULAR | Status: AC
  Filled 2023-10-01: qty 2

## 2023-10-01 MED ORDER — DEXMEDETOMIDINE HCL IN NACL 80 MCG/20ML IV SOLN
INTRAVENOUS | Status: DC | PRN
  Administered 2023-10-01 (×2): 8 ug via INTRAVENOUS
  Administered 2023-10-01: 4 ug via INTRAVENOUS

## 2023-10-01 MED ORDER — ACETAMINOPHEN 500 MG PO TABS
1000.0000 mg | ORAL_TABLET | Freq: Once | ORAL | Status: AC
  Administered 2023-10-01: 1000 mg via ORAL

## 2023-10-01 MED ORDER — 0.9 % SODIUM CHLORIDE (POUR BTL) OPTIME
TOPICAL | Status: DC | PRN
  Administered 2023-10-01: 1000 mL

## 2023-10-01 MED ORDER — ONDANSETRON HCL 4 MG/2ML IJ SOLN
INTRAMUSCULAR | Status: AC
  Filled 2023-10-01: qty 2

## 2023-10-01 MED ORDER — INSULIN ASPART 100 UNIT/ML IJ SOLN
0.0000 [IU] | INTRAMUSCULAR | Status: DC | PRN
  Administered 2023-10-01: 4 [IU] via SUBCUTANEOUS

## 2023-10-01 MED ORDER — DEXAMETHASONE SODIUM PHOSPHATE 10 MG/ML IJ SOLN
INTRAMUSCULAR | Status: DC | PRN
  Administered 2023-10-01: 5 mg via INTRAVENOUS

## 2023-10-01 MED ORDER — CHLOROPROCAINE HCL 1 % IJ SOLN
INTRAMUSCULAR | Status: DC | PRN
  Administered 2023-10-01: 10 mL

## 2023-10-01 MED ORDER — SODIUM CHLORIDE 0.9 % IR SOLN
Status: DC | PRN
Start: 2023-10-01 — End: 2023-10-01
  Administered 2023-10-01: 3000 mL

## 2023-10-01 MED ORDER — OXYCODONE-ACETAMINOPHEN 5-325 MG PO TABS: 1.0000 | ORAL_TABLET | Freq: Four times a day (QID) | ORAL | 0 refills | Status: DC | PRN

## 2023-10-01 MED ORDER — OXYCODONE HCL 5 MG/5ML PO SOLN: 5.0000 mg | Freq: Once | ORAL | Status: DC | PRN

## 2023-10-01 MED ORDER — FENTANYL CITRATE (PF) 250 MCG/5ML IJ SOLN
INTRAMUSCULAR | Status: AC
  Filled 2023-10-01: qty 5

## 2023-10-01 MED ORDER — FENTANYL CITRATE (PF) 100 MCG/2ML IJ SOLN
INTRAMUSCULAR | Status: AC
  Filled 2023-10-01: qty 2

## 2023-10-01 MED ORDER — CHLORHEXIDINE GLUCONATE 0.12 % MT SOLN
15.0000 mL | Freq: Once | OROMUCOSAL | Status: AC
  Administered 2023-10-01: 15 mL via OROMUCOSAL

## 2023-10-01 MED ORDER — IBUPROFEN 600 MG PO TABS: 600.0000 mg | ORAL_TABLET | Freq: Four times a day (QID) | ORAL | 3 refills | Status: DC | PRN

## 2023-10-01 MED ORDER — CHLOROPROCAINE HCL 1 % IJ SOLN
INTRAMUSCULAR | Status: AC
  Filled 2023-10-01: qty 30

## 2023-10-01 MED ORDER — AMISULPRIDE (ANTIEMETIC) 5 MG/2ML IV SOLN: 10.0000 mg | Freq: Once | INTRAVENOUS | Status: DC | PRN

## 2023-10-01 MED ORDER — KETOROLAC TROMETHAMINE 30 MG/ML IJ SOLN
INTRAMUSCULAR | Status: AC
  Filled 2023-10-01: qty 1

## 2023-10-01 MED ORDER — FENTANYL CITRATE (PF) 250 MCG/5ML IJ SOLN
INTRAMUSCULAR | Status: DC | PRN
  Administered 2023-10-01 (×2): 50 ug via INTRAVENOUS

## 2023-10-01 MED ORDER — CHLORHEXIDINE GLUCONATE 0.12 % MT SOLN
OROMUCOSAL | Status: AC
  Filled 2023-10-01: qty 15

## 2023-10-01 MED ORDER — LIDOCAINE 2% (20 MG/ML) 5 ML SYRINGE
INTRAMUSCULAR | Status: AC
  Filled 2023-10-01: qty 5

## 2023-10-01 MED ORDER — ORAL CARE MOUTH RINSE: 15.0000 mL | Freq: Once | OROMUCOSAL | Status: AC

## 2023-10-01 SURGICAL SUPPLY — 14 items
CATH ROBINSON RED A/P 16FR (CATHETERS) IMPLANT
COVER MAYO STAND STRL (DRAPES) ×1 IMPLANT
DILATOR CANAL MILEX (MISCELLANEOUS) IMPLANT
GLOVE BIOGEL PI IND STRL 6.5 (GLOVE) ×1 IMPLANT
GLOVE SURG SS PI 6.5 STRL IVOR (GLOVE) ×1 IMPLANT
GLOVE SURG UNDER POLY LF SZ7 (GLOVE) ×1 IMPLANT
GOWN STRL REUS W/ TWL LRG LVL3 (GOWN DISPOSABLE) ×1 IMPLANT
NS IRRIG 1000ML POUR BTL (IV SOLUTION) IMPLANT
PACK VAGINAL MINOR WOMEN LF (CUSTOM PROCEDURE TRAY) ×1 IMPLANT
PAD OB MATERNITY 11 LF (PERSONAL CARE ITEMS) ×1 IMPLANT
SET GENESYS HTA PROCERVA (MISCELLANEOUS) IMPLANT
SOL .9 NS 3000ML IRR UROMATIC (IV SOLUTION) IMPLANT
TOWEL GREEN STERILE FF (TOWEL DISPOSABLE) ×1 IMPLANT
UNDERPAD 30X36 HEAVY ABSORB (UNDERPADS AND DIAPERS) ×1 IMPLANT

## 2023-10-01 NOTE — Anesthesia Procedure Notes (Signed)
 Procedure Name: LMA Insertion Date/Time: 10/01/2023 8:43 AM  Performed by: Shlomo Tinnie SAILOR, RNPre-anesthesia Checklist: Patient identified, Emergency Drugs available, Suction available and Patient being monitored Patient Re-evaluated:Patient Re-evaluated prior to induction Oxygen Delivery Method: Circle System Utilized Preoxygenation: Pre-oxygenation with 100% oxygen Induction Type: IV induction Ventilation: Mask ventilation without difficulty LMA: LMA inserted LMA Size: 4.0 Number of attempts: 1 Airway Equipment and Method: Bite block Placement Confirmation: positive ETCO2 Tube secured with: Tape Dental Injury: Teeth and Oropharynx as per pre-operative assessment

## 2023-10-01 NOTE — Transfer of Care (Signed)
 Immediate Anesthesia Transfer of Care Note  Patient: Melinda Stone  Procedure(s) Performed: ABLATION, ENDOMETRIUM, HYSTEROSCOPIC (Uterus)  Patient Location: PACU  Anesthesia Type:General  Level of Consciousness: awake and sedated  Airway & Oxygen Therapy: Patient Spontanous Breathing and Patient connected to face mask oxygen  Post-op Assessment: Report given to RN and Post -op Vital signs reviewed and stable  Post vital signs: Reviewed and stable  Last Vitals:  Vitals Value Taken Time  BP 138/77 10/01/23 09:23  Temp    Pulse 81 10/01/23 09:25  Resp 19 10/01/23 09:25  SpO2 100 % 10/01/23 09:25  Vitals shown include unfiled device data.  Last Pain:  Vitals:   10/01/23 0704  TempSrc: Oral  PainSc: 9       Patients Stated Pain Goal: 8 (10/01/23 0704)  Complications: No notable events documented.

## 2023-10-01 NOTE — Op Note (Signed)
 PREOPERATIVE DIAGNOSIS:  Abnormal uterine bleeding POSTOPERATIVE DIAGNOSIS: The same PROCEDURE: Hysteroscopy, Dilation and Curettage,  Hydrothermal Endometrial Ablation SURGEON:  Dr. Winton Felt  INDICATIONS: 43 y.o. P3 here for scheduled surgery for abnormal uterine bleeding. Risks of surgery were discussed with the patient including but not limited to: bleeding; infection which may require antibiotics; injury to uterus leading to risk of injury to surrounding intraperitoneal organs, burn injury to vagina or other organs, need for additional procedures including laparoscopy or laparotomy, inability to complete ablation due to uterine or mechanical anomaly, and other postoperative/anesthesia complications.  Patient was informed that there is a high likelihood of success of controlling her symptoms; however about 5% of patients may require further intervention.  Written informed consent was obtained.    FINDINGS:  A 9-week size uterus.  Diffuse proliferative endometrium.  Normal ostia bilaterally.  ANESTHESIA:   General, paracervical block. INTRAVENOUS FLUIDS:  600 ml of LR ESTIMATED BLOOD LOSS:  25 ml SPECIMENS: None COMPLICATIONS:  None immediate.  PROCEDURE DETAILS:  The patient was then taken to the operating room where general anesthesia was administered and was found to be adequate.  After an adequate timeout was performed, she was placed in the dorsal lithotomy position and examined; then prepped and draped in the sterile manner.   Her bladder was catheterized for clear, yellow urine. A speculum was then placed in the patient's vagina and a single tooth tenaculum was applied to the anterior lip of the cervix.   A paracervical block using 10 ml of 0.5% Marcaine  was administered.  The cervix was sounded to 9 cm and dilated manually with metal dilators to accommodate the hydrothermal ablation hysteroscopic apparatus.  Once the cervix was dilated,the hysteroscope was inserted under direct  visualization using normal saline as a suspension medium.  The uterine cavity was carefully examined, both ostia were recognized, and diffusely proliferative endometrium was noted.   The hydrothermal ablation was then carried out as per protocol.   Complete ablation of the endometrium was observed and the hysteroscope was removed under direct visualization.  No complications were observed.  The tenaculum was removed from the anterior lip of the cervix, and the vaginal speculum was removed after noting good hemostasis.  The patient tolerated the procedure well and was taken to the recovery area awake, extubated and in stable condition.  The patient will be discharged to home as per PACU criteria.  Routine postoperative instructions given.

## 2023-10-01 NOTE — Discharge Instructions (Signed)
   Post Anesthesia Home Care Instructions  Activity: Get plenty of rest for the remainder of the day. A responsible individual must stay with you for 24 hours following the procedure.  For the next 24 hours, DO NOT: -Drive a car -Advertising copywriter -Drink alcoholic beverages -Take any medication unless instructed by your physician -Make any legal decisions or sign important papers.  Meals: Start with liquid foods such as gelatin or soup. Progress to regular foods as tolerated. Avoid greasy, spicy, heavy foods. If nausea and/or vomiting occur, drink only clear liquids until the nausea and/or vomiting subsides. Call your physician if vomiting continues.  Special Instructions/Symptoms: Your throat may feel dry or sore from the anesthesia or the breathing tube placed in your throat during surgery. If this causes discomfort, gargle with warm salt water. The discomfort should disappear within 24 hours.  If you had a scopolamine patch placed behind your ear for the management of post- operative nausea and/or vomiting:  1. The medication in the patch is effective for 72 hours, after which it should be removed.  Wrap patch in a tissue and discard in the trash. Wash hands thoroughly with soap and water. 2. You may remove the patch earlier than 72 hours if you experience unpleasant side effects which may include dry mouth, dizziness or visual disturbances. 3. Avoid touching the patch. Wash your hands with soap and water after contact with the patch.

## 2023-10-01 NOTE — Anesthesia Postprocedure Evaluation (Signed)
 Anesthesia Post Note  Patient: Melinda Stone  Procedure(s) Performed: ABLATION, ENDOMETRIUM, HYSTEROSCOPIC (Uterus)     Patient location during evaluation: PACU Anesthesia Type: General Level of consciousness: awake Pain management: pain level controlled Vital Signs Assessment: post-procedure vital signs reviewed and stable Respiratory status: spontaneous breathing, nonlabored ventilation and respiratory function stable Cardiovascular status: blood pressure returned to baseline and stable Postop Assessment: no apparent nausea or vomiting Anesthetic complications: no   No notable events documented.  Last Vitals:  Vitals:   10/01/23 1005 10/01/23 1034  BP: (!) 156/90 (!) 162/91  Pulse: 83 83  Resp: 14 16  Temp: 36.4 C 36.5 C  SpO2: 100% 100%    Last Pain:  Vitals:   10/01/23 1005  TempSrc:   PainSc: 7                  Delon Aisha Arch

## 2023-10-01 NOTE — H&P (Signed)
 Melinda Stone is an 43 y.o. female P3 with AUB here for endometrial ablation. Patient with history of AUB medically managed with megace . Patient has been intermittently compliant with medical management. As a result she experienced episodes of significant anemia requiring blood transfusion, most recently 08/2023. Patient was counseling on long term management with levonorgestrel IUD or endometrial ablation and she opted for the latter. Patient has been taking Megace  since her hospital discharge and reports no vaginal bleeding. Patient is without any new complaints today    Menstrual History:  Patient's last menstrual period was 09/04/2023 (approximate).    Past Medical History:  Diagnosis Date   Anemia    Arthritis 08/17/2020   Chronic back pain    Diabetes mellitus without complication (HCC)    type 2   GERD (gastroesophageal reflux disease)    Hypertension    Migraine    Ovarian cyst    Pneumonia    Polyneuropathy     Past Surgical History:  Procedure Laterality Date   CESAREAN SECTION     CHOLECYSTECTOMY  2004   EYE SURGERY Left    cataract   IR BONE MARROW BIOPSY & ASPIRATION  08/01/2023   LUMBAR LAMINECTOMY N/A 05/30/2020   Procedure: Lumbar five Laminectomy,  Bilateral Microdiscectomy, Left Lumbar five -Sacral one Microdiscectomy;  Surgeon: Barbarann Oneil BROCKS, MD;  Location: [facility] OR;  Service: Orthopedics;  Laterality: N/A;   LUMBAR LAMINECTOMY Left 11/14/2020   Procedure: LEFT LUMBAR FOUR-FIVE MICRODISCECTOMY;  Surgeon: Barbarann Oneil BROCKS, MD;  Location: [facility] OR;  Service: Orthopedics;  Laterality: Left;   TUBAL LIGATION  10/19/2010    Family History  Problem Relation Age of Onset   Diabetes Mother    Hypertension Mother    Stroke Mother     Social History:  reports that she quit smoking about 21 months ago. Her smoking use included cigars. She has never used smokeless tobacco. She reports that she does not currently use alcohol . She reports that she does not currently use  drugs.  Allergies:  Allergies  Allergen Reactions   Trulicity  [Dulaglutide ] Diarrhea    Medications Prior to Admission  Medication Sig Dispense Refill Last Dose/Taking   acetaminophen  (TYLENOL ) 500 MG tablet Take 1,000 mg by mouth every 6 (six) hours as needed for mild pain (pain score 1-3) or moderate pain (pain score 4-6).   09/30/2023 Evening   albuterol  (VENTOLIN  HFA) 108 (90 Base) MCG/ACT inhaler Inhale 2 puffs into the lungs every 6 (six) hours as needed for wheezing or shortness of breath. 18 g 2 Unknown   amitriptyline  (ELAVIL ) 25 MG tablet Take 1 tablet (25 mg total) by mouth at bedtime. 30 tablet 6 09/30/2023 Evening   Cyanocobalamin  (VITAMIN B-12) 2500 MCG SUBL Place 1 tablet (2,500 mcg total) under the tongue daily. 30 tablet 2 09/30/2023   ferrous sulfate  325 (65 FE) MG tablet Take 1 tablet (325 mg total) by mouth daily with breakfast. 30 tablet 0 09/30/2023   gabapentin  (NEURONTIN ) 800 MG tablet Take 800 mg by mouth 3 (three) times daily.   09/30/2023 Evening   Galcanezumab -gnlm (EMGALITY ) 120 MG/ML SOAJ Inject 120 mg into the skin every 30 (thirty) days. 1.12 mL 11 Past Month   insulin  glargine (LANTUS  SOLOSTAR) 100 UNIT/ML Solostar Pen Inject 45 Units into the skin daily before breakfast. 45 mL 3 09/30/2023 Morning   insulin  lispro (HUMALOG  KWIKPEN) 100 UNIT/ML KwikPen Inject 12-22 Units into the skin 3 (three) times daily. Max daily 60 mL 4 09/30/2023 Evening  losartan  (COZAAR ) 25 MG tablet Take 1 tablet (25 mg total) by mouth daily. 30 tablet 3 09/30/2023   metoprolol  tartrate (LOPRESSOR ) 25 MG tablet Take 1 tablet (25 mg total) by mouth 2 (two) times daily. 60 tablet 0 09/30/2023 Evening   mometasone -formoterol  (DULERA ) 100-5 MCG/ACT AERO Inhale 2 puffs into the lungs 2 (two) times daily at 10 AM and 5 PM. 13 g 0 09/30/2023   Multiple Vitamin (MULTIVITAMIN WITH MINERALS) TABS tablet Take 1 tablet by mouth daily.   09/30/2023   oxyCODONE -acetaminophen  (PERCOCET/ROXICET) 5-325 MG tablet  Take 1 tablet by mouth every 6 (six) hours as needed for moderate pain (pain score 4-6) or severe pain (pain score 7-10).   09/30/2023 Evening   pantoprazole  (PROTONIX ) 40 MG tablet Take 1 tablet (40 mg total) by mouth daily. 30 tablet 0 09/30/2023 Morning   predniSONE  (DELTASONE ) 20 MG tablet Take 2 tablets (40 mg total) by mouth daily with breakfast. 30 tablet 0 09/30/2023 Morning   vitamin C  (ASCORBIC ACID ) 250 MG tablet Take 2 tablets (500 mg total) by mouth daily. 180 tablet 0 09/30/2023   Continuous Glucose Sensor (DEXCOM G7 SENSOR) MISC 1 Device by Does not apply route as directed. Change sensor every 10 days (Patient taking differently: Inject 1 Device into the skin See admin instructions. Change sensor every 10 days) 9 each 3    furosemide  (LASIX ) 20 MG tablet Take 1 tablet (20 mg total) by mouth in the morning. (Patient not taking: Reported on 10/01/2023) 30 tablet 3 Not Taking   Insulin  Disposable Pump (OMNIPOD 5 G7 INTRO, GEN 5,) KIT 1 Device by Does not apply route every other day. 1 kit 0    Insulin  Disposable Pump (OMNIPOD 5 G7 PODS, GEN 5,) MISC 1 Device by Does not apply route every other day. 45 each 3    Insulin  Pen Needle 32G X 4 MM MISC 1 Device by Does not apply route in the morning, at noon, in the evening, and at bedtime. 400 each 3    Rimegepant Sulfate (NURTEC) 75 MG TBDP Take 1 tablet (75 mg total) by mouth as needed. Take 1 tablet at onset of headache, max is 1 tablet in 24 hours. 8 tablet 11 Unknown   tiZANidine  (ZANAFLEX ) 4 MG tablet Take 4 mg by mouth every 12 (twelve) hours as needed for muscle spasms.   Unknown    Review of Systems See pertinent in HPI. All other systems reviewed and non contributory Blood pressure (!) 167/86, pulse 91, temperature 97.9 F (36.6 C), temperature source Oral, resp. rate 18, height 5' 9 (1.753 m), weight 101.2 kg, last menstrual period 09/04/2023, SpO2 98%. Physical Exam GENERAL: Well-developed, well-nourished female in no acute distress.   LUNGS: Clear to auscultation bilaterally.  HEART: Regular rate and rhythm. ABDOMEN: Soft, nontender, nondistended. No organomegaly. PELVIC: Deferred to OR EXTREMITIES: No cyanosis, clubbing, or edema, 2+ distal pulses.  Results for orders placed or performed during the hospital encounter of 10/01/23 (from the past 24 hours)  Pregnancy, urine POC     Status: None   Collection Time: 10/01/23  6:19 AM  Result Value Ref Range   Preg Test, Ur NEGATIVE NEGATIVE    No results found. June 2025 pathology ENDOMETRIUM, BIOPSY:  - Proliferative endometrium.  - Negative for atypia/EIN and malignancy.   Assessment/Plan: 43 yo with AUB here for scheduled endometrial ablation - Risks, benefits and alternatives were explained including but not limited to risks of bleeding, infection, uterine perforation and damage to adjacent  organs. - Patient verbalized understanding and all questions were answered  Jasten Guyette 10/01/2023, 7:22 AM

## 2023-10-02 ENCOUNTER — Telehealth: Payer: Self-pay

## 2023-10-02 ENCOUNTER — Encounter (HOSPITAL_COMMUNITY): Payer: Self-pay | Admitting: Obstetrics and Gynecology

## 2023-10-02 NOTE — Telephone Encounter (Signed)
 Pharmacy Patient Advocate Encounter   Received notification from CoverMyMeds that prior authorization for Ozempic  2MG /3ML pen-injectors is required/requested.   Insurance verification completed.   The patient is insured through [facility] .   I don't see this on her med list and I see she hasn't picked it up from the pharmacy since 2024. I also see a note that she was intolerant to Ozempic  and Trulicty. Please advise if this PA is needed or if med has been d/c'd

## 2023-10-02 NOTE — Telephone Encounter (Signed)
 Patient not on the medication

## 2023-10-07 ENCOUNTER — Telehealth: Payer: Self-pay

## 2023-10-07 ENCOUNTER — Inpatient Hospital Stay

## 2023-10-07 DIAGNOSIS — D509 Iron deficiency anemia, unspecified: Secondary | ICD-10-CM | POA: Diagnosis not present

## 2023-10-07 DIAGNOSIS — D649 Anemia, unspecified: Secondary | ICD-10-CM

## 2023-10-07 LAB — CBC WITH DIFFERENTIAL (CANCER CENTER ONLY)
Abs Immature Granulocytes: 0.08 K/uL — ABNORMAL HIGH (ref 0.00–0.07)
Basophils Absolute: 0.1 K/uL (ref 0.0–0.1)
Basophils Relative: 1 %
Eosinophils Absolute: 0.4 K/uL (ref 0.0–0.5)
Eosinophils Relative: 3 %
HCT: 24.8 % — ABNORMAL LOW (ref 36.0–46.0)
Hemoglobin: 8.6 g/dL — ABNORMAL LOW (ref 12.0–15.0)
Immature Granulocytes: 1 %
Lymphocytes Relative: 16 %
Lymphs Abs: 2.1 K/uL (ref 0.7–4.0)
MCH: 29 pg (ref 26.0–34.0)
MCHC: 34.7 g/dL (ref 30.0–36.0)
MCV: 83.5 fL (ref 80.0–100.0)
Monocytes Absolute: 0.6 K/uL (ref 0.1–1.0)
Monocytes Relative: 5 %
Neutro Abs: 9.8 K/uL — ABNORMAL HIGH (ref 1.7–7.7)
Neutrophils Relative %: 74 %
Platelet Count: 159 K/uL (ref 150–400)
RBC: 2.97 MIL/uL — ABNORMAL LOW (ref 3.87–5.11)
RDW: 17.5 % — ABNORMAL HIGH (ref 11.5–15.5)
WBC Count: 13 K/uL — ABNORMAL HIGH (ref 4.0–10.5)
nRBC: 0 % (ref 0.0–0.2)

## 2023-10-07 LAB — SAMPLE TO BLOOD BANK

## 2023-10-07 LAB — BILIRUBIN, TOTAL AND DIRECT (CANCER CENTER ONLY)
Bilirubin, Direct: <0.1 mg/dL (ref 0.0–0.2)
Total Bilirubin: 0.6 mg/dL (ref 0.0–1.2)

## 2023-10-07 NOTE — Telephone Encounter (Signed)
 Hemoglobin 8.6 today. Per Dr. Babara, no blood tomorrow and continue Prednisone  40mg  daily. Please arrange weeklly lab (cbc no diff, hold tube) +/- D2 PRBC.    Tranfusion has been cancelled and pt informed of appts and MD recommendations.

## 2023-10-08 ENCOUNTER — Encounter

## 2023-10-09 ENCOUNTER — Emergency Department (HOSPITAL_COMMUNITY)
Admission: EM | Admit: 2023-10-09 | Discharge: 2023-10-10 | Disposition: A | Discharge status: Home / Self Care | Attending: Emergency Medicine | Admitting: Emergency Medicine

## 2023-10-09 ENCOUNTER — Other Ambulatory Visit: Payer: Self-pay

## 2023-10-09 DIAGNOSIS — Z794 Long term (current) use of insulin: Secondary | ICD-10-CM | POA: Diagnosis not present

## 2023-10-09 DIAGNOSIS — E119 Type 2 diabetes mellitus without complications: Secondary | ICD-10-CM | POA: Diagnosis not present

## 2023-10-09 DIAGNOSIS — N3000 Acute cystitis without hematuria: Secondary | ICD-10-CM

## 2023-10-09 DIAGNOSIS — N939 Abnormal uterine and vaginal bleeding, unspecified: Secondary | ICD-10-CM | POA: Insufficient documentation

## 2023-10-09 DIAGNOSIS — I10 Essential (primary) hypertension: Secondary | ICD-10-CM | POA: Diagnosis not present

## 2023-10-09 DIAGNOSIS — Z79899 Other long term (current) drug therapy: Secondary | ICD-10-CM | POA: Diagnosis not present

## 2023-10-09 LAB — CBG MONITORING, ED: Glucose-Capillary: 358 mg/dL — ABNORMAL HIGH (ref 70–99)

## 2023-10-09 NOTE — ED Triage Notes (Addendum)
 Patient c/o hemoglobin being low. Every time my head is swimming and I'm SOB my hemoglobin is low. Patient stated she has been getting blood every week but did not get it Tuesday. Patient c/o lower abdominal pain. Patient drowsy in triage stating I'm just really tired. BP 87/47 in triage.

## 2023-10-09 NOTE — ED Notes (Signed)
 Patient c/o being nauseous at this time.

## 2023-10-10 DIAGNOSIS — N939 Abnormal uterine and vaginal bleeding, unspecified: Secondary | ICD-10-CM | POA: Diagnosis not present

## 2023-10-10 LAB — TYPE AND SCREEN
ABO/RH(D): A POS
Antibody Screen: NEGATIVE

## 2023-10-10 LAB — CBC WITH DIFFERENTIAL/PLATELET
Abs Immature Granulocytes: 0.18 K/uL — ABNORMAL HIGH (ref 0.00–0.07)
Basophils Absolute: 0.1 K/uL (ref 0.0–0.1)
Basophils Relative: 1 %
Eosinophils Absolute: 0.1 K/uL (ref 0.0–0.5)
Eosinophils Relative: 0 %
HCT: 25.4 % — ABNORMAL LOW (ref 36.0–46.0)
Hemoglobin: 8.7 g/dL — ABNORMAL LOW (ref 12.0–15.0)
Immature Granulocytes: 1 %
Lymphocytes Relative: 11 %
Lymphs Abs: 1.8 K/uL (ref 0.7–4.0)
MCH: 28.4 pg (ref 26.0–34.0)
MCHC: 34.3 g/dL (ref 30.0–36.0)
MCV: 83 fL (ref 80.0–100.0)
Monocytes Absolute: 0.8 K/uL (ref 0.1–1.0)
Monocytes Relative: 5 %
Neutro Abs: 13.1 K/uL — ABNORMAL HIGH (ref 1.7–7.7)
Neutrophils Relative %: 82 %
Platelets: 225 K/uL (ref 150–400)
RBC: 3.06 MIL/uL — ABNORMAL LOW (ref 3.87–5.11)
RDW: 17.2 % — ABNORMAL HIGH (ref 11.5–15.5)
WBC: 16 K/uL — ABNORMAL HIGH (ref 4.0–10.5)
nRBC: 0 % (ref 0.0–0.2)

## 2023-10-10 LAB — COMPREHENSIVE METABOLIC PANEL WITH GFR
ALT: 13 U/L (ref 0–44)
AST: 13 U/L — ABNORMAL LOW (ref 15–41)
Albumin: 3.2 g/dL — ABNORMAL LOW (ref 3.5–5.0)
Alkaline Phosphatase: 145 U/L — ABNORMAL HIGH (ref 38–126)
Anion gap: 11 (ref 5–15)
BUN: 36 mg/dL — ABNORMAL HIGH (ref 6–20)
CO2: 17 mmol/L — ABNORMAL LOW (ref 22–32)
Calcium: 8.9 mg/dL (ref 8.9–10.3)
Chloride: 106 mmol/L (ref 98–111)
Creatinine, Ser: 1.55 mg/dL — ABNORMAL HIGH (ref 0.44–1.00)
GFR, Estimated: 43 mL/min — ABNORMAL LOW (ref >60)
Glucose, Bld: 359 mg/dL — ABNORMAL HIGH (ref 70–99)
Potassium: 4.7 mmol/L (ref 3.5–5.1)
Sodium: 134 mmol/L — ABNORMAL LOW (ref 135–145)
Total Bilirubin: 0.9 mg/dL (ref 0.0–1.2)
Total Protein: 6.6 g/dL (ref 6.5–8.1)

## 2023-10-10 LAB — HCG, SERUM, QUALITATIVE: Preg, Serum: NEGATIVE

## 2023-10-10 MED ORDER — SODIUM CHLORIDE 0.9 % IV BOLUS
500.0000 mL | Freq: Once | INTRAVENOUS | Status: AC
  Administered 2023-10-10: 500 mL via INTRAVENOUS

## 2023-10-10 MED ORDER — SODIUM CHLORIDE 0.9 % IV SOLN
1.0000 g | Freq: Once | INTRAVENOUS | Status: AC
  Administered 2023-10-10: 1 g via INTRAVENOUS
  Filled 2023-10-10: qty 10

## 2023-10-10 MED ORDER — CEPHALEXIN 500 MG PO CAPS: 500.0000 mg | ORAL_CAPSULE | Freq: Four times a day (QID) | ORAL | 0 refills | Status: DC

## 2023-10-10 NOTE — Progress Notes (Signed)
 Responded to consult for IV. On arrival, RN had obtained access. Consult cleared.

## 2023-10-10 NOTE — ED Provider Notes (Signed)
 [facility] Stone EMERGENCY DEPARTMENT AT [facility] Provider Note   CSN: [id number] Arrival date & time: 10/09/23  2316     Patient presents with: abnormal labs   Melinda Stone is a 43 y.o. female.   The history is provided by the patient.  Vaginal Bleeding Quality:  Dark red Severity:  Moderate Onset quality:  Gradual Duration:  2 days Timing:  Constant Progression:  Unchanged Chronicity:  Recurrent Number of pads used:  3 Possible pregnancy: no   Context: spontaneously   Relieved by:  Nothing Worsened by:  Nothing Ineffective treatments:  None tried Associated symptoms: no abdominal pain, no fatigue and no fever   Risk factors: no bleeding disorder   Risk factors comment:  Just had an ablation  Patient with diabetes and chronic pain on home opioids who is status post uterine ablation presents with vaginal bleeding.   Past Medical History:  Diagnosis Date   Anemia    Arthritis 08/17/2020   Chronic back pain    Diabetes mellitus without complication (HCC)    type 2   GERD (gastroesophageal reflux disease)    Hypertension    Migraine    Ovarian cyst    Pneumonia    Polyneuropathy        Prior to Admission medications   Medication Sig Start Date End Date Taking? Authorizing Provider  acetaminophen  (TYLENOL ) 500 MG tablet Take 1,000 mg by mouth every 6 (six) hours as needed for mild pain (pain score 1-3) or moderate pain (pain score 4-6).    [provider]  albuterol  (VENTOLIN  HFA) 108 (90 Base) MCG/ACT inhaler Inhale 2 puffs into the lungs every 6 (six) hours as needed for wheezing or shortness of breath. 05/29/23   Setzer, Sandra J, PA-C  amitriptyline  (ELAVIL ) 25 MG tablet Take 1 tablet (25 mg total) by mouth at bedtime. 01/31/23   Gayland Lauraine PARAS, NP  Continuous Glucose Sensor (DEXCOM G7 SENSOR) MISC 1 Device by Does not apply route as directed. Change sensor every 10 days Patient taking differently: Inject 1 Device into the skin See  admin instructions. Change sensor every 10 days 06/21/23   Shamleffer, Ibtehal Jaralla, MD  Cyanocobalamin  (VITAMIN B-12) 2500 MCG SUBL Place 1 tablet (2,500 mcg total) under the tongue daily. 04/23/23   Babara Call, MD  ferrous sulfate  325 (65 FE) MG tablet Take 1 tablet (325 mg total) by mouth daily with breakfast. 05/29/23   Setzer, Nena PARAS, PA-C  furosemide  (LASIX ) 20 MG tablet Take 1 tablet (20 mg total) by mouth in the morning. Patient not taking: Reported on 10/01/2023 07/24/23   Michele Richardson, DO  gabapentin  (NEURONTIN ) 800 MG tablet Take 800 mg by mouth 3 (three) times daily.    [provider]  Galcanezumab -gnlm (EMGALITY ) 120 MG/ML SOAJ Inject 120 mg into the skin every 30 (thirty) days. 08/20/23   Gayland Lauraine PARAS, NP  ibuprofen  (ADVIL ) 600 MG tablet Take 1 tablet (600 mg total) by mouth every 6 (six) hours as needed. 10/01/23   Constant, Peggy, MD  Insulin  Disposable Pump (OMNIPOD 5 G7 INTRO, GEN 5,) KIT 1 Device by Does not apply route every other day. 09/26/23   Shamleffer, Ibtehal Jaralla, MD  Insulin  Disposable Pump (OMNIPOD 5 G7 PODS, GEN 5,) MISC 1 Device by Does not apply route every other day. 09/26/23   Shamleffer, Ibtehal Jaralla, MD  insulin  glargine (LANTUS  SOLOSTAR) 100 UNIT/ML Solostar Pen Inject 45 Units into the skin daily before breakfast. 09/26/23   Shamleffer, Donell  Jaralla, MD  insulin  lispro (HUMALOG  KWIKPEN) 100 UNIT/ML KwikPen Inject 12-22 Units into the skin 3 (three) times daily. Max daily 09/26/23   Shamleffer, Donell Cardinal, MD  Insulin  Pen Needle 32G X 4 MM MISC 1 Device by Does not apply route in the morning, at noon, in the evening, and at bedtime. 09/26/23   Shamleffer, Ibtehal Jaralla, MD  losartan  (COZAAR ) 25 MG tablet Take 1 tablet (25 mg total) by mouth daily. 07/16/23   Tolia, Sunit, DO  metoprolol  tartrate (LOPRESSOR ) 25 MG tablet Take 1 tablet (25 mg total) by mouth 2 (two) times daily. 05/29/23 10/01/23  Setzer, Sandra J, PA-C  mometasone -formoterol  (DULERA )  100-5 MCG/ACT AERO Inhale 2 puffs into the lungs 2 (two) times daily at 10 AM and 5 PM. 05/29/23   Setzer, Sandra J, PA-C  Multiple Vitamin (MULTIVITAMIN WITH MINERALS) TABS tablet Take 1 tablet by mouth daily. 05/29/23   Setzer, Sandra J, PA-C  oxyCODONE -acetaminophen  (PERCOCET/ROXICET) 5-325 MG tablet Take 1 tablet by mouth every 6 (six) hours as needed. 10/01/23   Constant, Peggy, MD  pantoprazole  (PROTONIX ) 40 MG tablet Take 1 tablet (40 mg total) by mouth daily. 05/29/23   Setzer, Sandra J, PA-C  predniSONE  (DELTASONE ) 20 MG tablet Take 2 tablets (40 mg total) by mouth daily with breakfast. 09/23/23   Babara Call, MD  Rimegepant Sulfate (NURTEC) 75 MG TBDP Take 1 tablet (75 mg total) by mouth as needed. Take 1 tablet at onset of headache, max is 1 tablet in 24 hours. 08/20/23   Gayland Lauraine PARAS, NP  tiZANidine  (ZANAFLEX ) 4 MG tablet Take 4 mg by mouth every 12 (twelve) hours as needed for muscle spasms. 06/10/23   [provider]  vitamin C  (ASCORBIC ACID ) 250 MG tablet Take 2 tablets (500 mg total) by mouth daily. 04/10/22   Babara Call, MD    Allergies: Trulicity  [dulaglutide ]    Review of Systems  Constitutional:  Negative for diaphoresis, fatigue and fever.  Respiratory:  Negative for wheezing and stridor.   Cardiovascular:  Negative for chest pain.  Gastrointestinal:  Negative for abdominal pain.  Genitourinary:  Positive for vaginal bleeding.  Neurological:  Negative for weakness.  All other systems reviewed and are negative.   Updated Vital Signs BP 97/60   Pulse 83   Temp 98.2 F (36.8 C)   Resp 15   SpO2 99%   Physical Exam Vitals and nursing note reviewed.  Constitutional:      General: She is not in acute distress.    Appearance: Normal appearance. She is well-developed.  HENT:     Head: Normocephalic and atraumatic.     Nose: Nose normal.  Eyes:     Pupils: Pupils are equal, round, and reactive to light.  Cardiovascular:     Rate and Rhythm: Normal rate and regular  rhythm.     Pulses: Normal pulses.     Heart sounds: Normal heart sounds.  Pulmonary:     Effort: Pulmonary effort is normal. No respiratory distress.     Breath sounds: Normal breath sounds.  Abdominal:     General: Bowel sounds are normal. There is no distension.     Palpations: Abdomen is soft.     Tenderness: There is no abdominal tenderness. There is no guarding or rebound.  Musculoskeletal:        General: Normal range of motion.     Cervical back: Neck supple.  Skin:    General: Skin is warm and dry.  Capillary Refill: Capillary refill takes less than 2 seconds.     Findings: No erythema or rash.  Neurological:     General: No focal deficit present.     Mental Status: She is alert and oriented to person, place, and time.     Deep Tendon Reflexes: Reflexes normal.  Psychiatric:        Mood and Affect: Mood normal.     (all labs ordered are listed, but only abnormal results are displayed) Results for orders placed or performed during the hospital encounter of 10/09/23  CBG monitoring, ED   Collection Time: 10/09/23 11:34 PM  Result Value Ref Range   Glucose-Capillary 358 (H) 70 - 99 mg/dL  Comprehensive metabolic panel   Collection Time: 10/10/23 12:32 AM  Result Value Ref Range   Sodium 134 (L) 135 - 145 mmol/L   Potassium 4.7 3.5 - 5.1 mmol/L   Chloride 106 98 - 111 mmol/L   CO2 17 (L) 22 - 32 mmol/L   Glucose, Bld 359 (H) 70 - 99 mg/dL   BUN 36 (H) 6 - 20 mg/dL   Creatinine, Ser 8.44 (H) 0.44 - 1.00 mg/dL   Calcium  8.9 8.9 - 10.3 mg/dL   Total Protein 6.6 6.5 - 8.1 g/dL   Albumin 3.2 (L) 3.5 - 5.0 g/dL   AST 13 (L) 15 - 41 U/L   ALT 13 0 - 44 U/L   Alkaline Phosphatase 145 (H) 38 - 126 U/L   Total Bilirubin 0.9 0.0 - 1.2 mg/dL   GFR, Estimated 43 (L) >60 mL/min   Anion gap 11 5 - 15  CBC with Diff   Collection Time: 10/10/23 12:32 AM  Result Value Ref Range   WBC 16.0 (H) 4.0 - 10.5 K/uL   RBC 3.06 (L) 3.87 - 5.11 MIL/uL   Hemoglobin 8.7 (L) 12.0 -  15.0 g/dL   HCT 74.5 (L) 63.9 - 53.9 %   MCV 83.0 80.0 - 100.0 fL   MCH 28.4 26.0 - 34.0 pg   MCHC 34.3 30.0 - 36.0 g/dL   RDW 82.7 (H) 88.4 - 84.4 %   Platelets 225 150 - 400 K/uL   nRBC 0.0 0.0 - 0.2 %   Neutrophils Relative % 82 %   Neutro Abs 13.1 (H) 1.7 - 7.7 K/uL   Lymphocytes Relative 11 %   Lymphs Abs 1.8 0.7 - 4.0 K/uL   Monocytes Relative 5 %   Monocytes Absolute 0.8 0.1 - 1.0 K/uL   Eosinophils Relative 0 %   Eosinophils Absolute 0.1 0.0 - 0.5 K/uL   Basophils Relative 1 %   Basophils Absolute 0.1 0.0 - 0.1 K/uL   Immature Granulocytes 1 %   Abs Immature Granulocytes 0.18 (H) 0.00 - 0.07 K/uL  hCG, serum, qualitative   Collection Time: 10/10/23 12:32 AM  Result Value Ref Range   Preg, Serum NEGATIVE NEGATIVE  Type and screen [facility]   Collection Time: 10/10/23 12:32 AM  Result Value Ref Range   ABO/RH(D) A POS    Antibody Screen NEG    Sample Expiration      10/13/2023,2359 Performed at [facility], [address], [place], [place] [address]    No results found.  Radiology: No results found.   Procedures   Medications Ordered in the ED  sodium chloride  0.9 % bolus 500 mL (has no administration in time range)  Medical Decision Making Patient with vaginal bleeding post ablation   Amount and/or Complexity of Data Reviewed External Data Reviewed: notes.    Details: Previous notes reviewed  Labs: ordered.    Details: White count slight elevation 16, hemoglobin unchanged 8.7, normal platelets.  Sodium 134, normal potassium 4.7, creatinine slight elevation 1.55, normal Bili  Discussion of management or test interpretation with external provider(s): Case d/w Dr. Cleatus, no need to start medication this is expected post ablation.  Follow up as an outpatient   Risk Prescription drug management. Risk Details: IV fluids given.  Patient treated in the ED and RX for home antibiotics  for UTI.  Patient is neither febrile nor septic at this time.  Patient instructed to follow up with GYN closely.  No indication for transfusion at this time as hemoglobin is stable.  Stable for discharge.  Strict returns.       Final diagnoses:  None   No signs of systemic illness or infection. The patient is nontoxic-appearing on exam and vital signs are within normal limits.  I have reviewed the triage vital signs and the nursing notes. Pertinent labs & imaging results that were available during my care of the patient were reviewed by me and considered in my medical decision making (see chart for details). After history, exam, and medical workup I feel the patient has been appropriately medically screened and is safe for discharge home. Pertinent diagnoses were discussed with the patient. Patient was given return precautions.  ED Discharge Orders     None          Cordie Buening, MD 10/10/23 [id number]

## 2023-10-14 ENCOUNTER — Other Ambulatory Visit

## 2023-10-15 ENCOUNTER — Encounter

## 2023-10-18 ENCOUNTER — Encounter: Admitting: Obstetrics and Gynecology

## 2023-10-18 HISTORY — PX: EYE SURGERY: SHX253

## 2023-10-21 ENCOUNTER — Inpatient Hospital Stay: Attending: Oncology

## 2023-10-21 ENCOUNTER — Telehealth: Payer: Self-pay | Admitting: *Deleted

## 2023-10-21 DIAGNOSIS — N1831 Chronic kidney disease, stage 3a: Secondary | ICD-10-CM | POA: Insufficient documentation

## 2023-10-21 DIAGNOSIS — D631 Anemia in chronic kidney disease: Secondary | ICD-10-CM | POA: Insufficient documentation

## 2023-10-21 DIAGNOSIS — D649 Anemia, unspecified: Secondary | ICD-10-CM

## 2023-10-21 LAB — CBC (CANCER CENTER ONLY)
HCT: 26.1 % — ABNORMAL LOW (ref 36.0–46.0)
Hemoglobin: 8.6 g/dL — ABNORMAL LOW (ref 12.0–15.0)
MCH: 29 pg (ref 26.0–34.0)
MCHC: 33 g/dL (ref 30.0–36.0)
MCV: 87.9 fL (ref 80.0–100.0)
Platelet Count: 133 K/uL — ABNORMAL LOW (ref 150–400)
RBC: 2.97 MIL/uL — ABNORMAL LOW (ref 3.87–5.11)
RDW: 18.6 % — ABNORMAL HIGH (ref 11.5–15.5)
WBC Count: 17 K/uL — ABNORMAL HIGH (ref 4.0–10.5)
nRBC: 0.2 % (ref 0.0–0.2)

## 2023-10-21 LAB — SAMPLE TO BLOOD BANK

## 2023-10-21 NOTE — Telephone Encounter (Signed)
 Called and informed patient that she did not need a blood transfusion tomorrow and appointment has been cancelled.

## 2023-10-22 ENCOUNTER — Inpatient Hospital Stay

## 2023-10-24 ENCOUNTER — Encounter (HOSPITAL_COMMUNITY): Payer: Self-pay

## 2023-10-24 ENCOUNTER — Other Ambulatory Visit: Payer: Self-pay

## 2023-10-24 ENCOUNTER — Emergency Department (HOSPITAL_COMMUNITY)

## 2023-10-24 ENCOUNTER — Emergency Department (HOSPITAL_COMMUNITY)
Admission: EM | Admit: 2023-10-24 | Discharge: 2023-10-25 | Disposition: A | Discharge status: Home / Self Care | Attending: Emergency Medicine | Admitting: Emergency Medicine

## 2023-10-24 DIAGNOSIS — R059 Cough, unspecified: Secondary | ICD-10-CM | POA: Diagnosis not present

## 2023-10-24 DIAGNOSIS — E875 Hyperkalemia: Secondary | ICD-10-CM | POA: Diagnosis not present

## 2023-10-24 DIAGNOSIS — Z794 Long term (current) use of insulin: Secondary | ICD-10-CM | POA: Diagnosis not present

## 2023-10-24 DIAGNOSIS — Z79899 Other long term (current) drug therapy: Secondary | ICD-10-CM | POA: Insufficient documentation

## 2023-10-24 DIAGNOSIS — R0789 Other chest pain: Secondary | ICD-10-CM | POA: Diagnosis present

## 2023-10-24 DIAGNOSIS — I10 Essential (primary) hypertension: Secondary | ICD-10-CM | POA: Diagnosis not present

## 2023-10-24 DIAGNOSIS — E1165 Type 2 diabetes mellitus with hyperglycemia: Secondary | ICD-10-CM | POA: Diagnosis not present

## 2023-10-24 DIAGNOSIS — R739 Hyperglycemia, unspecified: Secondary | ICD-10-CM

## 2023-10-24 LAB — BASIC METABOLIC PANEL WITH GFR
Anion gap: 9 (ref 5–15)
BUN: 32 mg/dL — ABNORMAL HIGH (ref 6–20)
CO2: 17 mmol/L — ABNORMAL LOW (ref 22–32)
Calcium: 8.6 mg/dL — ABNORMAL LOW (ref 8.9–10.3)
Chloride: 107 mmol/L (ref 98–111)
Creatinine, Ser: 1.29 mg/dL — ABNORMAL HIGH (ref 0.44–1.00)
GFR, Estimated: 53 mL/min — ABNORMAL LOW (ref >60)
Glucose, Bld: 504 mg/dL (ref 70–99)
Potassium: 5.4 mmol/L — ABNORMAL HIGH (ref 3.5–5.1)
Sodium: 133 mmol/L — ABNORMAL LOW (ref 135–145)

## 2023-10-24 LAB — TROPONIN I (HIGH SENSITIVITY): Troponin I (High Sensitivity): 11 ng/L (ref <18)

## 2023-10-24 LAB — CBG MONITORING, ED: Glucose-Capillary: >600 mg/dL (ref 70–99)

## 2023-10-24 MED ORDER — BENZONATATE 100 MG PO CAPS
100.0000 mg | ORAL_CAPSULE | Freq: Once | ORAL | Status: AC
Start: 2023-10-24 — End: 2023-10-24
  Administered 2023-10-24: 100 mg via ORAL
  Filled 2023-10-24 (×2): qty 1

## 2023-10-24 MED ORDER — IPRATROPIUM-ALBUTEROL 0.5-2.5 (3) MG/3ML IN SOLN
3.0000 mL | Freq: Once | RESPIRATORY_TRACT | Status: AC
Start: 2023-10-24 — End: 2023-10-24
  Administered 2023-10-24: 3 mL via RESPIRATORY_TRACT
  Filled 2023-10-24: qty 3

## 2023-10-24 MED ORDER — SODIUM CHLORIDE 0.9 % IV BOLUS
1000.0000 mL | Freq: Once | INTRAVENOUS | Status: AC
  Administered 2023-10-25: 1000 mL via INTRAVENOUS

## 2023-10-24 MED ORDER — NAPROXEN 250 MG PO TABS
500.0000 mg | ORAL_TABLET | Freq: Once | ORAL | Status: AC
  Administered 2023-10-24: 500 mg via ORAL
  Filled 2023-10-24: qty 2

## 2023-10-24 MED ORDER — INSULIN ASPART 100 UNIT/ML IJ SOLN
10.0000 [IU] | Freq: Once | INTRAMUSCULAR | Status: AC
  Administered 2023-10-24: 10 [IU] via SUBCUTANEOUS

## 2023-10-24 NOTE — ED Triage Notes (Signed)
 Mid sternal chest pain x 3 days with nausea and vomiting.

## 2023-10-24 NOTE — ED Notes (Signed)
 Unable to collect labs ashley h rn aware at

## 2023-10-24 NOTE — ED Notes (Signed)
 Patient name was called x5 with no response. Taking them OTF

## 2023-10-24 NOTE — ED Provider Notes (Signed)
 [place] EMERGENCY DEPARTMENT AT [facility] Provider Note   CSN: [id number] Arrival date & time: 10/24/23  1715     Patient presents with: Chest Pain   Melinda Stone is a 43 y.o. female.   43 year old female with history of IDDM, HTN, and chronic back pain (on Percocet) presents to the emergency department for chest pain x 3 days.  She has had associated cough and congestion for the past week.  Reports that her chest pain is associated with frequent coughing.  She has not had any relief from her chronic Percocet prescription.  Has also been using an albuterol  inhaler with little improvement; further notes use of oral prednisone  as well as an inhaled corticosteroid.  States she gets a cold every year around this time.  Denies fevers, sick contacts.  The history is provided by the patient. No language interpreter was used.  Chest Pain      Prior to Admission medications   Medication Sig Start Date End Date Taking? Authorizing Provider  acetaminophen  (TYLENOL ) 500 MG tablet Take 1,000 mg by mouth every 6 (six) hours as needed for mild pain (pain score 1-3) or moderate pain (pain score 4-6).    [provider]  albuterol  (VENTOLIN  HFA) 108 (90 Base) MCG/ACT inhaler Inhale 2 puffs into the lungs every 6 (six) hours as needed for wheezing or shortness of breath. 05/29/23   Setzer, Sandra J, PA-C  amitriptyline  (ELAVIL ) 25 MG tablet Take 1 tablet (25 mg total) by mouth at bedtime. 01/31/23   Gayland Lauraine PARAS, NP  cephALEXin  (KEFLEX ) 500 MG capsule Take 1 capsule (500 mg total) by mouth 4 (four) times daily. 10/10/23   Palumbo, April, MD  Continuous Glucose Sensor (DEXCOM G7 SENSOR) MISC 1 Device by Does not apply route as directed. Change sensor every 10 days Patient taking differently: Inject 1 Device into the skin See admin instructions. Change sensor every 10 days 06/21/23   Shamleffer, Ibtehal Jaralla, MD  Cyanocobalamin  (VITAMIN B-12) 2500 MCG SUBL Place 1 tablet  (2,500 mcg total) under the tongue daily. 04/23/23   Babara Call, MD  ferrous sulfate  325 (65 FE) MG tablet Take 1 tablet (325 mg total) by mouth daily with breakfast. 05/29/23   Setzer, Nena PARAS, PA-C  furosemide  (LASIX ) 20 MG tablet Take 1 tablet (20 mg total) by mouth in the morning. Patient not taking: Reported on 10/01/2023 07/24/23   Tolia, Sunit, DO  gabapentin  (NEURONTIN ) 800 MG tablet Take 800 mg by mouth 3 (three) times daily.    [provider]  Galcanezumab -gnlm (EMGALITY ) 120 MG/ML SOAJ Inject 120 mg into the skin every 30 (thirty) days. 08/20/23   Gayland Lauraine PARAS, NP  ibuprofen  (ADVIL ) 600 MG tablet Take 1 tablet (600 mg total) by mouth every 6 (six) hours as needed. 10/01/23   Constant, Peggy, MD  Insulin  Disposable Pump (OMNIPOD 5 G7 INTRO, GEN 5,) KIT 1 Device by Does not apply route every other day. 09/26/23   Shamleffer, Ibtehal Jaralla, MD  Insulin  Disposable Pump (OMNIPOD 5 G7 PODS, GEN 5,) MISC 1 Device by Does not apply route every other day. 09/26/23   Shamleffer, Ibtehal Jaralla, MD  insulin  glargine (LANTUS  SOLOSTAR) 100 UNIT/ML Solostar Pen Inject 45 Units into the skin daily before breakfast. 09/26/23   Shamleffer, Ibtehal Jaralla, MD  insulin  lispro (HUMALOG  KWIKPEN) 100 UNIT/ML KwikPen Inject 12-22 Units into the skin 3 (three) times daily. Max daily 09/26/23   Shamleffer, Ibtehal Jaralla, MD  Insulin  Pen Needle 32G  X 4 MM MISC 1 Device by Does not apply route in the morning, at noon, in the evening, and at bedtime. 09/26/23   Shamleffer, Ibtehal Jaralla, MD  losartan  (COZAAR ) 25 MG tablet Take 1 tablet (25 mg total) by mouth daily. 07/16/23   Tolia, Sunit, DO  metoprolol  tartrate (LOPRESSOR ) 25 MG tablet Take 1 tablet (25 mg total) by mouth 2 (two) times daily. 05/29/23 10/01/23  Setzer, Sandra J, PA-C  mometasone -formoterol  (DULERA ) 100-5 MCG/ACT AERO Inhale 2 puffs into the lungs 2 (two) times daily at 10 AM and 5 PM. 05/29/23   Setzer, Sandra J, PA-C  Multiple Vitamin  (MULTIVITAMIN WITH MINERALS) TABS tablet Take 1 tablet by mouth daily. 05/29/23   Setzer, Sandra J, PA-C  oxyCODONE -acetaminophen  (PERCOCET/ROXICET) 5-325 MG tablet Take 1 tablet by mouth every 6 (six) hours as needed. 10/01/23   Constant, Peggy, MD  pantoprazole  (PROTONIX ) 40 MG tablet Take 1 tablet (40 mg total) by mouth daily. 05/29/23   Setzer, Sandra J, PA-C  predniSONE  (DELTASONE ) 20 MG tablet Take 2 tablets (40 mg total) by mouth daily with breakfast. 09/23/23   Babara Call, MD  Rimegepant Sulfate (NURTEC) 75 MG TBDP Take 1 tablet (75 mg total) by mouth as needed. Take 1 tablet at onset of headache, max is 1 tablet in 24 hours. 08/20/23   Gayland Lauraine PARAS, NP  tiZANidine  (ZANAFLEX ) 4 MG tablet Take 4 mg by mouth every 12 (twelve) hours as needed for muscle spasms. 06/10/23   [provider]  vitamin C  (ASCORBIC ACID ) 250 MG tablet Take 2 tablets (500 mg total) by mouth daily. 04/10/22   Babara Call, MD    Allergies: Trulicity  [dulaglutide ]    Review of Systems  Cardiovascular:  Positive for chest pain.  Ten systems reviewed and are negative for acute change, except as noted in the HPI.    Updated Vital Signs BP (!) 138/58   Pulse 91   Temp 97.9 F (36.6 C)   Resp (!) 21   Ht 5' 9 (1.753 m)   Wt 100.7 kg   LMP 10/15/2023   SpO2 100%   BMI 32.78 kg/m   Physical Exam Vitals and nursing note reviewed.  Constitutional:      General: She is not in acute distress.    Appearance: She is well-developed. She is not diaphoretic.     Comments: Nontoxic appearing, in NAD  HENT:     Head: Normocephalic and atraumatic.  Eyes:     General: No scleral icterus.    Conjunctiva/sclera: Conjunctivae normal.  Cardiovascular:     Rate and Rhythm: Normal rate and regular rhythm.     Pulses: Normal pulses.  Pulmonary:     Effort: Pulmonary effort is normal. No respiratory distress.     Breath sounds: No stridor.     Comments: Scattered adventitious sounds. No rales or rhonchi. Respirations even  and unlabored. Musculoskeletal:        General: Normal range of motion.     Cervical back: Normal range of motion.  Skin:    General: Skin is warm and dry.     Coloration: Skin is not pale.     Findings: No erythema or rash.  Neurological:     Mental Status: She is alert and oriented to person, place, and time.     Coordination: Coordination normal.  Psychiatric:        Behavior: Behavior normal.     (all labs ordered are listed, but only abnormal results are displayed)  Labs Reviewed  BASIC METABOLIC PANEL WITH GFR - Abnormal; Notable for the following components:      Result Value   Sodium 133 (*)    Potassium 5.4 (*)    CO2 17 (*)    Glucose, Bld 504 (*)    BUN 32 (*)    Creatinine, Ser 1.29 (*)    Calcium  8.6 (*)    GFR, Estimated 53 (*)    All other components within normal limits  CBG MONITORING, ED - Abnormal; Notable for the following components:   Glucose-Capillary >600 (*)    All other components within normal limits  CBC  HCG, SERUM, QUALITATIVE  POTASSIUM  TROPONIN I (HIGH SENSITIVITY)  TROPONIN I (HIGH SENSITIVITY)    EKG: EKG Interpretation Date/Time:  Thursday October 24 2023 17:29:13 EDT Ventricular Rate:  99 PR Interval:  174 QRS Duration:  72 QT Interval:  350 QTC Calculation: 449 R Axis:   19  Text Interpretation: Sinus rhythm with occasional Premature ventricular complexes When compared with ECG of 05-Sep-2023 17:32, PREVIOUS ECG IS PRESENT Confirmed by Cottie Cough [phone]) on 10/24/2023 8:43:24 PM  Radiology: [facility] Chest 2 View Result Date: 10/24/2023 CLINICAL DATA:  Chest pain. EXAM: CHEST - 2 VIEW COMPARISON:  05/14/2023. FINDINGS: Bilateral lung fields are clear. Bilateral costophrenic angles are clear. Normal cardio-mediastinal silhouette. No acute osseous abnormalities. The soft tissues are within normal limits. IMPRESSION: No active cardiopulmonary disease. Electronically Signed   By: Ree Molt M.D.   On: 10/24/2023 17:59      Procedures   Medications Ordered in the ED  sodium chloride  0.9 % bolus 1,000 mL (has no administration in time range)  insulin  aspart (novoLOG ) injection 10 Units (has no administration in time range)  ipratropium-albuterol  (DUONEB) 0.5-2.5 (3) MG/3ML nebulizer solution 3 mL (3 mLs Nebulization Given 10/24/23 2145)  benzonatate  (TESSALON ) capsule 100 mg (100 mg Oral Given 10/24/23 2147)  naproxen  (NAPROSYN ) tablet 500 mg (500 mg Oral Given 10/24/23 2147)    Clinical Course as of 10/24/23 2326  Thu Oct 24, 2023  2251 Patient reports that she no longer has insulin  for her pump.  She has been using subcutaneous sliding scale insulin  in the interim. Will give 10 units in the ED for hyperglycemia. Presently no clinical concern for DKA. Anion gap is normal. Bicarb noted to be chronically low. Creatinine stable. [KH]    Clinical Course User Index [KH] Keith Sor, PA-C                                 Medical Decision Making Amount and/or Complexity of Data Reviewed Labs: ordered. Radiology: ordered.  Risk Prescription drug management.   This patient presents to the ED for concern of chest pain, this involves an extensive number of treatment options, and is a complaint that carries with it a high risk of complications and morbidity.  The differential diagnosis includes ACS vs pericarditis vs myocarditis vs costochondritis vs PNA vs pleural effusion   Co morbidities that complicate the patient evaluation  IDDM HTN Chronic pain   Additional history obtained:  Additional history obtained from family at bedside   Lab Tests:  I Ordered, and personally interpreted labs.  The pertinent results include:  K 5.4, CO2 17 (chronically low, stable), Glucose 504, Creatinine 1.29 (stable)   Imaging Studies ordered:  I ordered imaging studies including CXR  I independently visualized and interpreted imaging which showed no acute cardiopulmonary abnormality  I agree with the radiologist  interpretation   Cardiac Monitoring:  The patient was maintained on a cardiac monitor.  I personally viewed and interpreted the cardiac monitored which showed an underlying rhythm of: NSR   Medicines ordered and prescription drug management:  I ordered medication including insulin  and IVF for hyperglycemia, Duoneb for cough and wheezing. I have reviewed the patients home medicines and have made adjustments as needed   Test Considered:  Viral respiratory panel   Problem List / ED Course:  Atypical presentation for chest pain, though has had elevated troponins in the past.  Pending delta troponin level.  Initial has been reassuring, negative.  Given that pain is aggravated with coughing in the setting of URI symptoms, likely costochondral in etiology.  Chest x-ray negative for acute cardiopulmonary abnormality. As an aside, noted to be hyperkalemic.  Repeat ordered to rule out lab error.  Also hyperglycemic without concern for DKA.  States that her insulin  pump is not functioning.  She has been using subcutaneous sliding scale insulin  at home.  Given 10 units of NovoLog  while in the ED as well as IV fluids.   Reevaluation:  After the interventions noted above, I reevaluated the patient and found that they have :stayed the same   Dispostion:  Care signed out to [place], PA-C at shift change      Final diagnoses:  Atypical chest pain  Hyperglycemia    ED Discharge Orders     None          Keith Sor, PA-C 10/24/23 2330    Cottie Donnice PARAS, MD 10/25/23 1432

## 2023-10-25 DIAGNOSIS — R0789 Other chest pain: Secondary | ICD-10-CM | POA: Diagnosis not present

## 2023-10-25 LAB — CBC
HCT: 24.4 % — ABNORMAL LOW (ref 36.0–46.0)
Hemoglobin: 8.1 g/dL — ABNORMAL LOW (ref 12.0–15.0)
MCH: 29.5 pg (ref 26.0–34.0)
MCHC: 33.2 g/dL (ref 30.0–36.0)
MCV: 88.7 fL (ref 80.0–100.0)
Platelets: 116 K/uL — ABNORMAL LOW (ref 150–400)
RBC: 2.75 MIL/uL — ABNORMAL LOW (ref 3.87–5.11)
RDW: 18.8 % — ABNORMAL HIGH (ref 11.5–15.5)
WBC: 10.9 K/uL — ABNORMAL HIGH (ref 4.0–10.5)
nRBC: 0 % (ref 0.0–0.2)

## 2023-10-25 LAB — HCG, SERUM, QUALITATIVE: Preg, Serum: NEGATIVE

## 2023-10-25 LAB — CBG MONITORING, ED
Glucose-Capillary: 506 mg/dL (ref 70–99)
Glucose-Capillary: 399 mg/dL — ABNORMAL HIGH (ref 70–99)

## 2023-10-25 LAB — POTASSIUM: Potassium: 4.8 mmol/L (ref 3.5–5.1)

## 2023-10-25 MED ORDER — GABAPENTIN 400 MG PO CAPS
800.0000 mg | ORAL_CAPSULE | Freq: Three times a day (TID) | ORAL | Status: DC
Start: 2023-10-25 — End: 2023-10-25
  Administered 2023-10-25: 800 mg via ORAL
  Filled 2023-10-25: qty 8

## 2023-10-25 MED ORDER — INSULIN ASPART 100 UNIT/ML IJ SOLN
10.0000 [IU] | Freq: Once | INTRAMUSCULAR | Status: AC
  Administered 2023-10-25: 10 [IU] via SUBCUTANEOUS

## 2023-10-25 NOTE — ED Provider Notes (Signed)
 Accepted handoff at shift change from [facility]. Please see prior provider note for more detail.   Briefly: Patient is 43 y.o. presenting for chest pain.  History of hypertension and diabetes  DDX: concern for s ACS vs pericarditis vs myocarditis vs costochondritis vs PNA vs pleural effusion   Plan: Follow-up on pending troponin  Physical Exam  BP (!) 154/87   Pulse 90   Temp 98.6 F (37 C) (Oral)   Resp 18   Ht 5' 9 (1.753 m)   Wt 100.7 kg   LMP 10/15/2023   SpO2 100%   BMI 32.78 kg/m   Physical Exam  Procedures  Procedures  ED Course / MDM   Clinical Course as of 10/25/23 0406  Thu Oct 24, 2023  2251 Patient reports that she no longer has insulin  for her pump.  She has been using subcutaneous sliding scale insulin  in the interim. Will give 10 units in the ED for hyperglycemia. Presently no clinical concern for DKA. Anion gap is normal. Bicarb noted to be chronically low. Creatinine stable. [KH]  2331 Here for CP. Likely costochon. Gave duoneb for mild wheezing. Hyperglycemia here but not dka. Has insulin  pump but not working. Has been sliding scale at home with subcutaneous. Delta trop pending. Recheck cbg after fluids and subcutaneous insulin .  [JR]    Clinical Course User Index [JR] Seletha Zimmermann K, PA-C [KH] Keith Sor, PA-C   Medical Decision Making Amount and/or Complexity of Data Reviewed Labs: ordered. Radiology: ordered.  Risk Prescription drug management.   Potassium normalized.  Blood glucose was still high at 506.  Treated again with another 10 units of NovoLog .  Rechecked blood sugar now trending down at 399.  Troponin is negative.  On reassessment her chest pain has improved.  Advised her to follow-up with her PCP.  Discharged in good condition.       Lang Norleen POUR, PA-C 10/25/23 0410    Lorette Mayo, MD 10/25/23 [phone]

## 2023-10-25 NOTE — ED Notes (Signed)
 Patient CBG was 399 the Nurse was Informed of results.

## 2023-10-25 NOTE — Discharge Instructions (Addendum)
 Evaluation today was overall reassuring.  Please follow-up your PCP.  If you develop chest pain, shortness of breath or any other concerning symptom please return to ED for further evaluation.

## 2023-10-28 ENCOUNTER — Emergency Department (HOSPITAL_COMMUNITY)

## 2023-10-28 ENCOUNTER — Encounter: Payer: Self-pay | Admitting: Oncology

## 2023-10-28 ENCOUNTER — Encounter (HOSPITAL_COMMUNITY): Payer: Self-pay | Admitting: *Deleted

## 2023-10-28 ENCOUNTER — Inpatient Hospital Stay (HOSPITAL_COMMUNITY)
Admission: EM | Admit: 2023-10-28 | Discharge: 2023-10-31 | DRG: 872 | Disposition: A | Discharge status: Home / Self Care | Attending: Internal Medicine | Admitting: Internal Medicine

## 2023-10-28 ENCOUNTER — Other Ambulatory Visit: Payer: Self-pay

## 2023-10-28 ENCOUNTER — Telehealth: Payer: Self-pay

## 2023-10-28 ENCOUNTER — Inpatient Hospital Stay

## 2023-10-28 ENCOUNTER — Inpatient Hospital Stay (HOSPITAL_BASED_OUTPATIENT_CLINIC_OR_DEPARTMENT_OTHER): Admitting: Oncology

## 2023-10-28 VITALS — BP 129/77 | HR 99 | Temp 97.8°F | Resp 18 | Wt 229.0 lb

## 2023-10-28 DIAGNOSIS — E1165 Type 2 diabetes mellitus with hyperglycemia: Secondary | ICD-10-CM | POA: Diagnosis present

## 2023-10-28 DIAGNOSIS — N3001 Acute cystitis with hematuria: Principal | ICD-10-CM

## 2023-10-28 DIAGNOSIS — M549 Dorsalgia, unspecified: Secondary | ICD-10-CM | POA: Diagnosis present

## 2023-10-28 DIAGNOSIS — E66811 Obesity, class 1: Secondary | ICD-10-CM | POA: Diagnosis present

## 2023-10-28 DIAGNOSIS — N3000 Acute cystitis without hematuria: Secondary | ICD-10-CM

## 2023-10-28 DIAGNOSIS — R700 Elevated erythrocyte sedimentation rate: Secondary | ICD-10-CM

## 2023-10-28 DIAGNOSIS — Z823 Family history of stroke: Secondary | ICD-10-CM

## 2023-10-28 DIAGNOSIS — Z1152 Encounter for screening for COVID-19: Secondary | ICD-10-CM

## 2023-10-28 DIAGNOSIS — G43909 Migraine, unspecified, not intractable, without status migrainosus: Secondary | ICD-10-CM | POA: Diagnosis present

## 2023-10-28 DIAGNOSIS — N390 Urinary tract infection, site not specified: Secondary | ICD-10-CM | POA: Diagnosis present

## 2023-10-28 DIAGNOSIS — K219 Gastro-esophageal reflux disease without esophagitis: Secondary | ICD-10-CM | POA: Diagnosis present

## 2023-10-28 DIAGNOSIS — E119 Type 2 diabetes mellitus without complications: Secondary | ICD-10-CM

## 2023-10-28 DIAGNOSIS — Z794 Long term (current) use of insulin: Secondary | ICD-10-CM

## 2023-10-28 DIAGNOSIS — A419 Sepsis, unspecified organism: Secondary | ICD-10-CM | POA: Diagnosis not present

## 2023-10-28 DIAGNOSIS — J0100 Acute maxillary sinusitis, unspecified: Secondary | ICD-10-CM | POA: Diagnosis present

## 2023-10-28 DIAGNOSIS — Z888 Allergy status to other drugs, medicaments and biological substances status: Secondary | ICD-10-CM

## 2023-10-28 DIAGNOSIS — R519 Headache, unspecified: Secondary | ICD-10-CM

## 2023-10-28 DIAGNOSIS — E1142 Type 2 diabetes mellitus with diabetic polyneuropathy: Secondary | ICD-10-CM | POA: Diagnosis present

## 2023-10-28 DIAGNOSIS — D638 Anemia in other chronic diseases classified elsewhere: Secondary | ICD-10-CM | POA: Diagnosis not present

## 2023-10-28 DIAGNOSIS — D649 Anemia, unspecified: Secondary | ICD-10-CM

## 2023-10-28 DIAGNOSIS — G44209 Tension-type headache, unspecified, not intractable: Secondary | ICD-10-CM | POA: Diagnosis present

## 2023-10-28 DIAGNOSIS — Z87891 Personal history of nicotine dependence: Secondary | ICD-10-CM

## 2023-10-28 DIAGNOSIS — I10 Essential (primary) hypertension: Secondary | ICD-10-CM | POA: Diagnosis present

## 2023-10-28 DIAGNOSIS — D508 Other iron deficiency anemias: Secondary | ICD-10-CM

## 2023-10-28 DIAGNOSIS — Z6833 Body mass index (BMI) 33.0-33.9, adult: Secondary | ICD-10-CM

## 2023-10-28 DIAGNOSIS — J320 Chronic maxillary sinusitis: Secondary | ICD-10-CM | POA: Diagnosis present

## 2023-10-28 DIAGNOSIS — R162 Hepatomegaly with splenomegaly, not elsewhere classified: Secondary | ICD-10-CM | POA: Diagnosis not present

## 2023-10-28 DIAGNOSIS — G8929 Other chronic pain: Secondary | ICD-10-CM | POA: Diagnosis present

## 2023-10-28 DIAGNOSIS — D519 Vitamin B12 deficiency anemia, unspecified: Secondary | ICD-10-CM

## 2023-10-28 DIAGNOSIS — E871 Hypo-osmolality and hyponatremia: Secondary | ICD-10-CM | POA: Diagnosis present

## 2023-10-28 DIAGNOSIS — E861 Hypovolemia: Secondary | ICD-10-CM | POA: Diagnosis present

## 2023-10-28 DIAGNOSIS — J019 Acute sinusitis, unspecified: Secondary | ICD-10-CM | POA: Insufficient documentation

## 2023-10-28 DIAGNOSIS — Z833 Family history of diabetes mellitus: Secondary | ICD-10-CM

## 2023-10-28 DIAGNOSIS — Z79899 Other long term (current) drug therapy: Secondary | ICD-10-CM

## 2023-10-28 DIAGNOSIS — J4540 Moderate persistent asthma, uncomplicated: Secondary | ICD-10-CM | POA: Diagnosis present

## 2023-10-28 DIAGNOSIS — D72829 Elevated white blood cell count, unspecified: Secondary | ICD-10-CM | POA: Diagnosis present

## 2023-10-28 DIAGNOSIS — Z7951 Long term (current) use of inhaled steroids: Secondary | ICD-10-CM

## 2023-10-28 DIAGNOSIS — E8721 Acute metabolic acidosis: Secondary | ICD-10-CM | POA: Diagnosis present

## 2023-10-28 DIAGNOSIS — Z8249 Family history of ischemic heart disease and other diseases of the circulatory system: Secondary | ICD-10-CM

## 2023-10-28 DIAGNOSIS — E538 Deficiency of other specified B group vitamins: Secondary | ICD-10-CM

## 2023-10-28 DIAGNOSIS — D696 Thrombocytopenia, unspecified: Secondary | ICD-10-CM | POA: Diagnosis present

## 2023-10-28 LAB — CMP (CANCER CENTER ONLY)
ALT: 18 U/L (ref 0–44)
AST: 16 U/L (ref 15–41)
Albumin: 2.6 g/dL — ABNORMAL LOW (ref 3.5–5.0)
Alkaline Phosphatase: 108 U/L (ref 38–126)
Anion gap: 6 (ref 5–15)
BUN: 18 mg/dL (ref 6–20)
CO2: 18 mmol/L — ABNORMAL LOW (ref 22–32)
Calcium: 8.1 mg/dL — ABNORMAL LOW (ref 8.9–10.3)
Chloride: 106 mmol/L (ref 98–111)
Creatinine: 0.99 mg/dL (ref 0.44–1.00)
GFR, Estimated: >60 mL/min (ref >60)
Glucose, Bld: 511 mg/dL (ref 70–99)
Potassium: 4.8 mmol/L (ref 3.5–5.1)
Sodium: 130 mmol/L — ABNORMAL LOW (ref 135–145)
Total Bilirubin: 0.8 mg/dL (ref 0.0–1.2)
Total Protein: 5.8 g/dL — ABNORMAL LOW (ref 6.5–8.1)

## 2023-10-28 LAB — CBC WITH DIFFERENTIAL (CANCER CENTER ONLY)
Abs Immature Granulocytes: 0.08 K/uL — ABNORMAL HIGH (ref 0.00–0.07)
Basophils Absolute: 0 K/uL (ref 0.0–0.1)
Basophils Relative: 0 %
Eosinophils Absolute: 0 K/uL (ref 0.0–0.5)
Eosinophils Relative: 0 %
HCT: 22 % — ABNORMAL LOW (ref 36.0–46.0)
Hemoglobin: 7.5 g/dL — ABNORMAL LOW (ref 12.0–15.0)
Immature Granulocytes: 1 %
Lymphocytes Relative: 12 %
Lymphs Abs: 1.3 K/uL (ref 0.7–4.0)
MCH: 29.8 pg (ref 26.0–34.0)
MCHC: 34.1 g/dL (ref 30.0–36.0)
MCV: 87.3 fL (ref 80.0–100.0)
Monocytes Absolute: 0.6 K/uL (ref 0.1–1.0)
Monocytes Relative: 5 %
Neutro Abs: 9.3 K/uL — ABNORMAL HIGH (ref 1.7–7.7)
Neutrophils Relative %: 82 %
Platelet Count: 98 K/uL — ABNORMAL LOW (ref 150–400)
RBC: 2.52 MIL/uL — ABNORMAL LOW (ref 3.87–5.11)
RDW: 19.2 % — ABNORMAL HIGH (ref 11.5–15.5)
WBC Count: 11.3 K/uL — ABNORMAL HIGH (ref 4.0–10.5)
nRBC: 0 % (ref 0.0–0.2)

## 2023-10-28 LAB — URINALYSIS, ROUTINE W REFLEX MICROSCOPIC
Bilirubin Urine: NEGATIVE
Glucose, UA: >=500 mg/dL — AB
Ketones, ur: NEGATIVE mg/dL
Nitrite: NEGATIVE
Protein, ur: >=300 mg/dL — AB
RBC / HPF: >50 RBC/hpf (ref 0–5)
Specific Gravity, Urine: 1.014 (ref 1.005–1.030)
WBC, UA: >50 WBC/hpf (ref 0–5)
pH: 5 (ref 5.0–8.0)

## 2023-10-28 LAB — COMPREHENSIVE METABOLIC PANEL WITH GFR
ALT: 18 U/L (ref 0–44)
AST: 17 U/L (ref 15–41)
Albumin: 2.7 g/dL — ABNORMAL LOW (ref 3.5–5.0)
Alkaline Phosphatase: 107 U/L (ref 38–126)
Anion gap: 7 (ref 5–15)
BUN: 16 mg/dL (ref 6–20)
CO2: 20 mmol/L — ABNORMAL LOW (ref 22–32)
Calcium: 8.6 mg/dL — ABNORMAL LOW (ref 8.9–10.3)
Chloride: 110 mmol/L (ref 98–111)
Creatinine, Ser: 1.09 mg/dL — ABNORMAL HIGH (ref 0.44–1.00)
GFR, Estimated: >60 mL/min (ref >60)
Glucose, Bld: 235 mg/dL — ABNORMAL HIGH (ref 70–99)
Potassium: 4.4 mmol/L (ref 3.5–5.1)
Sodium: 137 mmol/L (ref 135–145)
Total Bilirubin: 0.8 mg/dL (ref 0.0–1.2)
Total Protein: 6 g/dL — ABNORMAL LOW (ref 6.5–8.1)

## 2023-10-28 LAB — LACTIC ACID, PLASMA: Lactic Acid, Venous: 1.3 mmol/L (ref 0.5–1.9)

## 2023-10-28 LAB — CBC WITH DIFFERENTIAL/PLATELET
Abs Immature Granulocytes: 0.08 K/uL — ABNORMAL HIGH (ref 0.00–0.07)
Basophils Absolute: 0.1 K/uL (ref 0.0–0.1)
Basophils Relative: 0 %
Eosinophils Absolute: 0 K/uL (ref 0.0–0.5)
Eosinophils Relative: 0 %
HCT: 26.1 % — ABNORMAL LOW (ref 36.0–46.0)
Hemoglobin: 8.4 g/dL — ABNORMAL LOW (ref 12.0–15.0)
Immature Granulocytes: 1 %
Lymphocytes Relative: 11 %
Lymphs Abs: 1.5 K/uL (ref 0.7–4.0)
MCH: 29.7 pg (ref 26.0–34.0)
MCHC: 32.2 g/dL (ref 30.0–36.0)
MCV: 92.2 fL (ref 80.0–100.0)
Monocytes Absolute: 0.8 K/uL (ref 0.1–1.0)
Monocytes Relative: 6 %
Neutro Abs: 11.2 K/uL — ABNORMAL HIGH (ref 1.7–7.7)
Neutrophils Relative %: 82 %
Platelets: 121 K/uL — ABNORMAL LOW (ref 150–400)
RBC: 2.83 MIL/uL — ABNORMAL LOW (ref 3.87–5.11)
RDW: 19.2 % — ABNORMAL HIGH (ref 11.5–15.5)
WBC: 13.6 K/uL — ABNORMAL HIGH (ref 4.0–10.5)
nRBC: 0 % (ref 0.0–0.2)

## 2023-10-28 LAB — MAGNESIUM: Magnesium: 1.7 mg/dL (ref 1.7–2.4)

## 2023-10-28 LAB — RETIC PANEL
Immature Retic Fract: 6 % (ref 2.3–15.9)
RBC.: 2.35 MIL/uL — ABNORMAL LOW (ref 3.87–5.11)
Retic Count, Absolute: 291.5 K/uL — ABNORMAL HIGH (ref 19.0–186.0)
Retic Ct Pct: 12.4 % — ABNORMAL HIGH (ref 0.4–3.1)
Reticulocyte Hemoglobin: 32.5 pg (ref >27.9)

## 2023-10-28 LAB — RESP PANEL BY RT-PCR (RSV, FLU A&B, COVID)  RVPGX2
Influenza A by PCR: NEGATIVE
Influenza B by PCR: NEGATIVE
Resp Syncytial Virus by PCR: NEGATIVE
SARS Coronavirus 2 by RT PCR: NEGATIVE

## 2023-10-28 LAB — BRAIN NATRIURETIC PEPTIDE: B Natriuretic Peptide: 204.2 pg/mL — ABNORMAL HIGH (ref 0.0–100.0)

## 2023-10-28 LAB — HCG, SERUM, QUALITATIVE: Preg, Serum: NEGATIVE

## 2023-10-28 LAB — SAMPLE TO BLOOD BANK

## 2023-10-28 LAB — TROPONIN I (HIGH SENSITIVITY): Troponin I (High Sensitivity): 13 ng/L (ref <18)

## 2023-10-28 LAB — PROCALCITONIN: Procalcitonin: <0.1 ng/mL

## 2023-10-28 LAB — CBG MONITORING, ED: Glucose-Capillary: 157 mg/dL — ABNORMAL HIGH (ref 70–99)

## 2023-10-28 LAB — LIPASE, BLOOD: Lipase: 30 U/L (ref 11–51)

## 2023-10-28 MED ORDER — ONDANSETRON HCL 4 MG/2ML IJ SOLN
4.0000 mg | Freq: Four times a day (QID) | INTRAMUSCULAR | Status: DC | PRN
  Administered 2023-10-29 (×2): 4 mg via INTRAVENOUS
  Filled 2023-10-28: qty 2

## 2023-10-28 MED ORDER — INSULIN ASPART 100 UNIT/ML IJ SOLN
0.0000 [IU] | Freq: Three times a day (TID) | INTRAMUSCULAR | Status: DC
  Administered 2023-10-29: 11 [IU] via SUBCUTANEOUS
  Administered 2023-10-29: 5 [IU] via SUBCUTANEOUS
  Administered 2023-10-29: 11 [IU] via SUBCUTANEOUS
  Administered 2023-10-29: 8 [IU] via SUBCUTANEOUS
  Administered 2023-10-29: 5 [IU] via SUBCUTANEOUS
  Administered 2023-10-29: 8 [IU] via SUBCUTANEOUS
  Administered 2023-10-30: 3 [IU] via SUBCUTANEOUS
  Administered 2023-10-30: 8 [IU] via SUBCUTANEOUS
  Administered 2023-10-30 (×2): 11 [IU] via SUBCUTANEOUS
  Administered 2023-10-30: 8 [IU] via SUBCUTANEOUS
  Administered 2023-10-30: 3 [IU] via SUBCUTANEOUS
  Administered 2023-10-31: 2 [IU] via SUBCUTANEOUS

## 2023-10-28 MED ORDER — ACETAMINOPHEN 650 MG RE SUPP: 650.0000 mg | Freq: Four times a day (QID) | RECTAL | Status: DC | PRN

## 2023-10-28 MED ORDER — LACTATED RINGERS IV BOLUS
1000.0000 mL | Freq: Once | INTRAVENOUS | Status: AC
  Administered 2023-10-28 (×2): 1000 mL via INTRAVENOUS

## 2023-10-28 MED ORDER — OXYCODONE HCL 5 MG PO TABS
5.0000 mg | ORAL_TABLET | ORAL | Status: DC | PRN
  Administered 2023-10-29 – 2023-10-31 (×18): 5 mg via ORAL
  Filled 2023-10-28 (×10): qty 1

## 2023-10-28 MED ORDER — MELATONIN 3 MG PO TABS
3.0000 mg | ORAL_TABLET | Freq: Every evening | ORAL | Status: DC | PRN
  Administered 2023-10-29 – 2023-10-30 (×4): 3 mg via ORAL
  Filled 2023-10-28 (×2): qty 1

## 2023-10-28 MED ORDER — PROCHLORPERAZINE EDISYLATE 10 MG/2ML IJ SOLN
10.0000 mg | Freq: Four times a day (QID) | INTRAMUSCULAR | Status: DC | PRN
  Administered 2023-10-30 (×2): 10 mg via INTRAVENOUS
  Filled 2023-10-28: qty 2

## 2023-10-28 MED ORDER — KETOROLAC TROMETHAMINE 15 MG/ML IJ SOLN
15.0000 mg | Freq: Four times a day (QID) | INTRAMUSCULAR | Status: AC | PRN
  Administered 2023-10-29 – 2023-10-30 (×10): 15 mg via INTRAVENOUS
  Filled 2023-10-28 (×6): qty 1

## 2023-10-28 MED ORDER — PROCHLORPERAZINE EDISYLATE 10 MG/2ML IJ SOLN
10.0000 mg | Freq: Once | INTRAMUSCULAR | Status: AC
  Administered 2023-10-28 (×2): 10 mg via INTRAVENOUS
  Filled 2023-10-28: qty 2

## 2023-10-28 MED ORDER — INSULIN ASPART 100 UNIT/ML IJ SOLN
0.0000 [IU] | Freq: Every day | INTRAMUSCULAR | Status: DC
  Administered 2023-10-29 (×2): 3 [IU] via SUBCUTANEOUS

## 2023-10-28 MED ORDER — SODIUM CHLORIDE 0.9 % IV SOLN: INTRAVENOUS | Status: DC

## 2023-10-28 MED ORDER — ACETAMINOPHEN 500 MG PO TABS
1000.0000 mg | ORAL_TABLET | Freq: Once | ORAL | Status: AC
  Administered 2023-10-28 (×2): 1000 mg via ORAL
  Filled 2023-10-28: qty 2

## 2023-10-28 MED ORDER — ACETAMINOPHEN 500 MG PO TABS
1000.0000 mg | ORAL_TABLET | Freq: Four times a day (QID) | ORAL | Status: DC | PRN
  Administered 2023-10-29 – 2023-10-31 (×15): 1000 mg via ORAL
  Filled 2023-10-28 (×8): qty 2

## 2023-10-28 MED ORDER — DIPHENHYDRAMINE HCL 50 MG/ML IJ SOLN
25.0000 mg | Freq: Once | INTRAMUSCULAR | Status: AC
  Administered 2023-10-28 (×2): 25 mg via INTRAVENOUS
  Filled 2023-10-28: qty 1

## 2023-10-28 MED ORDER — ACETAMINOPHEN 325 MG PO TABS: 650.0000 mg | ORAL_TABLET | Freq: Four times a day (QID) | ORAL | Status: DC | PRN

## 2023-10-28 MED ORDER — KETOROLAC TROMETHAMINE 15 MG/ML IJ SOLN
15.0000 mg | Freq: Once | INTRAMUSCULAR | Status: AC
  Administered 2023-10-28 (×2): 15 mg via INTRAVENOUS
  Filled 2023-10-28: qty 1

## 2023-10-28 MED ORDER — MAGNESIUM SULFATE 2 GM/50ML IV SOLN
2.0000 g | Freq: Once | INTRAVENOUS | Status: AC
  Administered 2023-10-28 (×2): 2 g via INTRAVENOUS
  Filled 2023-10-28: qty 50

## 2023-10-28 MED ORDER — SODIUM CHLORIDE 0.9 % IV SOLN
1.0000 g | INTRAVENOUS | Status: DC
  Administered 2023-10-28 (×2): 1 g via INTRAVENOUS
  Filled 2023-10-28: qty 10

## 2023-10-28 MED ORDER — PIPERACILLIN-TAZOBACTAM 3.375 G IVPB 30 MIN
3.3750 g | Freq: Once | INTRAVENOUS | Status: AC
  Administered 2023-10-28 (×2): 3.375 g via INTRAVENOUS
  Filled 2023-10-28: qty 50

## 2023-10-28 NOTE — ED Provider Notes (Signed)
 [facility] Provider Note   CSN: [id number] Arrival date & time: 10/28/23  1524     History  Chief Complaint  Patient presents with   Illness    Melinda Stone is a 43 y.o. female with PMH as listed below who presents from home for illness, onset 2 weeks ago, no improvement, gradually progressively worse, including: HA, cough, congestion, chest discomfort, blood sugar running high, and legs swelling.  She endorses a 10 out of 10 gradual onset headache that is all over, associated with photophobia. When asked about flank or abdominal pain states yes as she has pain all over. Has nausea every morning but no vomiting.  Denies any urinary symptoms or diarrhea/constipation.  No sick contacts.  Endorses feeling short of breath.  No relief with gabapentin , percocet, musle relaxer, BP meds, insulin  and tylenol .    Past Medical History:  Diagnosis Date   Anemia    Arthritis 08/17/2020   Chronic back pain    Diabetes mellitus without complication (HCC)    type 2   GERD (gastroesophageal reflux disease)    Hypertension    Migraine    Ovarian cyst    Pneumonia    Polyneuropathy        Home Medications Prior to Admission medications   Medication Sig Start Date End Date Taking? Authorizing Provider  acetaminophen  (TYLENOL ) 500 MG tablet Take 1,000 mg by mouth every 6 (six) hours as needed for mild pain (pain score 1-3) or moderate pain (pain score 4-6).   Yes [provider]  albuterol  (VENTOLIN  HFA) 108 (90 Base) MCG/ACT inhaler Inhale 2 puffs into the lungs every 6 (six) hours as needed for wheezing or shortness of breath. 05/29/23  Yes Setzer, Nena PARAS, PA-C  amitriptyline  (ELAVIL ) 10 MG tablet Take 10 mg by mouth at bedtime. 10/22/23  Yes [provider]  amoxicillin -clavulanate (AUGMENTIN ) 500-125 MG tablet Take 500 mg by mouth in the morning and at bedtime. 10/18/23  Yes [provider]  Continuous Glucose  Sensor (DEXCOM G7 SENSOR) MISC 1 Device by Does not apply route as directed. Change sensor every 10 days Patient taking differently: Inject 1 Device into the skin See admin instructions. Change sensor every 10 days 06/21/23  Yes Shamleffer, Ibtehal Jaralla, MD  Cyanocobalamin  (VITAMIN B-12) 2500 MCG SUBL Place 1 tablet (2,500 mcg total) under the tongue daily. 04/23/23  Yes Babara Call, MD  ferrous sulfate  325 (65 FE) MG tablet Take 1 tablet (325 mg total) by mouth daily with breakfast. 05/29/23  Yes Setzer, Sandra J, PA-C  furosemide  (LASIX ) 20 MG tablet Take 1 tablet (20 mg total) by mouth in the morning. 07/24/23  Yes Tolia, Sunit, DO  gabapentin  (NEURONTIN ) 800 MG tablet Take 800 mg by mouth 3 (three) times daily.   Yes [provider]  Galcanezumab -gnlm (EMGALITY ) 120 MG/ML SOAJ Inject 120 mg into the skin every 30 (thirty) days. 08/20/23  Yes Gayland Lauraine PARAS, NP  ibuprofen  (ADVIL ) 600 MG tablet Take 1 tablet (600 mg total) by mouth every 6 (six) hours as needed. 10/01/23  Yes Constant, Peggy, MD  Insulin  Disposable Pump (OMNIPOD 5 G7 INTRO, GEN 5,) KIT 1 Device by Does not apply route every other day. 09/26/23  Yes Shamleffer, Donell Cardinal, MD  Insulin  Disposable Pump (OMNIPOD 5 G7 PODS, GEN 5,) MISC 1 Device by Does not apply route every other day. 09/26/23  Yes Shamleffer, Ibtehal Jaralla, MD  insulin  glargine (LANTUS  SOLOSTAR) 100 UNIT/ML Solostar  Pen Inject 45 Units into the skin daily before breakfast. 09/26/23  Yes Shamleffer, Ibtehal Jaralla, MD  insulin  lispro (HUMALOG  KWIKPEN) 100 UNIT/ML KwikPen Inject 12-22 Units into the skin 3 (three) times daily. Max daily 09/26/23  Yes Shamleffer, Ibtehal Jaralla, MD  ketorolac  (ACULAR ) 0.5 % ophthalmic solution Place 1 drop into the right eye 4 (four) times daily. 10/24/23  Yes [provider]  losartan  (COZAAR ) 25 MG tablet Take 1 tablet (25 mg total) by mouth daily. 07/16/23  Yes Tolia, Sunit, DO  metoprolol  tartrate (LOPRESSOR ) 25 MG tablet  Take 1 tablet (25 mg total) by mouth 2 (two) times daily. 05/29/23 10/28/23 Yes Setzer, Sandra J, PA-C  mometasone -formoterol  (DULERA ) 100-5 MCG/ACT AERO Inhale 2 puffs into the lungs 2 (two) times daily at 10 AM and 5 PM. 05/29/23  Yes Setzer, Sandra J, PA-C  Multiple Vitamin (MULTIVITAMIN WITH MINERALS) TABS tablet Take 1 tablet by mouth daily. 05/29/23  Yes Setzer, Sandra J, PA-C  ofloxacin (OCUFLOX) 0.3 % ophthalmic solution Place 1 drop into the right eye 4 (four) times daily. 10/24/23  Yes [provider]  oxyCODONE -acetaminophen  (PERCOCET) 7.5-325 MG tablet Take 1 tablet by mouth 4 (four) times daily as needed for moderate pain (pain score 4-6). 10/22/23  Yes [provider]  pantoprazole  (PROTONIX ) 40 MG tablet Take 1 tablet (40 mg total) by mouth daily. 05/29/23  Yes Setzer, Sandra J, PA-C  prednisoLONE acetate (PRED FORTE) 1 % ophthalmic suspension Place 1 drop into the right eye 4 (four) times daily. 10/24/23  Yes [provider]  Rimegepant Sulfate (NURTEC) 75 MG TBDP Take 1 tablet (75 mg total) by mouth as needed. Take 1 tablet at onset of headache, max is 1 tablet in 24 hours. 08/20/23  Yes Gayland Lauraine PARAS, NP  tiZANidine  (ZANAFLEX ) 4 MG tablet Take 4 mg by mouth every 12 (twelve) hours as needed for muscle spasms. 06/10/23  Yes [provider]  vitamin C  (ASCORBIC ACID ) 250 MG tablet Take 2 tablets (500 mg total) by mouth daily. 04/10/22  Yes Babara Call, MD  cephALEXin  (KEFLEX ) 500 MG capsule Take 1 capsule (500 mg total) by mouth 4 (four) times daily. Patient not taking: Reported on 10/28/2023 10/10/23   Palumbo, April, MD  Insulin  Pen Needle 32G X 4 MM MISC 1 Device by Does not apply route in the morning, at noon, in the evening, and at bedtime. 09/26/23   Shamleffer, Ibtehal Jaralla, MD  oxyCODONE -acetaminophen  (PERCOCET/ROXICET) 5-325 MG tablet Take 1 tablet by mouth every 6 (six) hours as needed. Patient not taking: Reported on 10/28/2023 10/01/23   Constant, Peggy, MD       Allergies    Trulicity  [dulaglutide ]    Review of Systems   Review of Systems A 10 point review of systems was performed and is negative unless otherwise reported in HPI.  Physical Exam Updated Vital Signs BP (!) 148/75   Pulse (!) 109   Temp 99.7 F (37.6 C) (Oral)   Resp (!) 21   Wt 103.9 kg   LMP 10/01/2023 (Approximate)   SpO2 100%   BMI 33.82 kg/m  Physical Exam General: Uncomfortable appearing female, lying in bed.  HEENT: PERRLA, EOMI, sclera anicteric, MMM, trachea midline.  No facial droop, tongue protrudes midline.  Clear oropharynx.  Bilateral TMs clear. Cardiology: Mildly regular tachycardic rate, no murmurs/rubs/gallops.  No chest wall tenderness palpation. Resp: Normal respiratory rate and effort. CTAB, no wheezes, rhonchi, crackles.  Abd: Soft, non-tender, non-distended. No rebound tenderness or guarding.  GU: Deferred. MSK: 1+ symmetric pitting edema in bilateral lower extremities without any signs of trauma. Extremities without deformity or TTP. No cyanosis or clubbing. Skin: warm, dry. Neuro: A&Ox4, CNs II-XII grossly intact. MAEs. Sensation grossly intact.  Psych: Normal mood and affect.   ED Results / Procedures / Treatments   Labs (all labs ordered are listed, but only abnormal results are displayed) Labs Reviewed  CBC WITH DIFFERENTIAL/PLATELET - Abnormal; Notable for the following components:      Result Value   WBC 13.6 (*)    RBC 2.83 (*)    Hemoglobin 8.4 (*)    HCT 26.1 (*)    RDW 19.2 (*)    Platelets 121 (*)    Neutro Abs 11.2 (*)    Abs Immature Granulocytes 0.08 (*)    All other components within normal limits  BRAIN NATRIURETIC PEPTIDE - Abnormal; Notable for the following components:   B Natriuretic Peptide 204.2 (*)    All other components within normal limits  URINALYSIS, ROUTINE W REFLEX MICROSCOPIC - Abnormal; Notable for the following components:   APPearance CLOUDY (*)    Glucose, UA >=500 (*)    Hgb urine dipstick  LARGE (*)    Protein, ur >=300 (*)    Leukocytes,Ua LARGE (*)    Bacteria, UA FEW (*)    Trichomonas, UA PRESENT (*)    All other components within normal limits  COMPREHENSIVE METABOLIC PANEL WITH GFR - Abnormal; Notable for the following components:   CO2 20 (*)    Glucose, Bld 235 (*)    Creatinine, Ser 1.09 (*)    Calcium  8.6 (*)    Total Protein 6.0 (*)    Albumin 2.7 (*)    All other components within normal limits  RESP PANEL BY RT-PCR (RSV, FLU A&B, COVID)  RVPGX2  URINE CULTURE  CULTURE, BLOOD (ROUTINE X 2)  CULTURE, BLOOD (ROUTINE X 2)  LIPASE, BLOOD  HCG, SERUM, QUALITATIVE  LACTIC ACID, PLASMA  TROPONIN I (HIGH SENSITIVITY)  TROPONIN I (HIGH SENSITIVITY)    EKG EKG Interpretation Date/Time:  Monday October 28 2023 16:17:41 EDT Ventricular Rate:  106 PR Interval:  168 QRS Duration:  68 QT Interval:  322 QTC Calculation: 427 R Axis:   14  Text Interpretation: Sinus tachycardia Otherwise normal ECG Confirmed by Franklyn Gills [phone]) on 10/28/2023 4:57:10 PM  Radiology CT Head Wo Contrast Result Date: 10/28/2023 CLINICAL DATA:  Headache. EXAM: CT HEAD WITHOUT CONTRAST TECHNIQUE: Contiguous axial images were obtained from the base of the skull through the vertex without intravenous contrast. RADIATION DOSE REDUCTION: This exam was performed according to the departmental dose-optimization program which includes automated exposure control, adjustment of the mA and/or kV according to patient size and/or use of iterative reconstruction technique. COMPARISON:  May 18, 2023 FINDINGS: Brain: No evidence of acute infarction, hemorrhage, hydrocephalus, extra-axial collection or mass lesion/mass effect. Vascular: No hyperdense vessel or unexpected calcification. Skull: A chronic fracture deformity is seen involving the right zygomatic arch. Sinuses/Orbits: There is marked severity bilateral ethmoid sinus mucosal thickening. Moderate severity sphenoid sinus mucosal thickening is  also seen. Small to moderate size bilateral maxillary sinus air-fluid levels are noted. Other: None. IMPRESSION: 1. No acute intracranial abnormality. 2. Marked severity bilateral ethmoid sinus and moderate severity sphenoid sinus disease. 3. Small to moderate size bilateral maxillary sinus air-fluid levels, consistent with acute sinusitis. Electronically Signed   By: Suzen Dials M.D.   On: 10/28/2023 20:08   DG Chest 2 View Result Date:  10/28/2023 CLINICAL DATA:  Short of breath, cough EXAM: CHEST - 2 VIEW COMPARISON:  None Available. FINDINGS: Normal mediastinum and cardiac silhouette. Normal pulmonary vasculature. No evidence of effusion, infiltrate, or pneumothorax. No acute bony abnormality. IMPRESSION: No acute cardiopulmonary process. Electronically Signed   By: Jackquline Boxer M.D.   On: 10/28/2023 17:25    Procedures .Critical Care  Performed by: Franklyn Sid SAILOR, MD Authorized by: Franklyn Sid SAILOR, MD   Critical care provider statement:    Critical care time (minutes):  30   Critical care was necessary to treat or prevent imminent or life-threatening deterioration of the following conditions:  Sepsis   Critical care was time spent personally by me on the following activities:  Development of treatment plan with patient or surrogate, discussions with consultants, evaluation of patient's response to treatment, examination of patient, ordering and review of laboratory studies, ordering and review of radiographic studies, ordering and performing treatments and interventions, pulse oximetry, re-evaluation of patient's condition and review of old charts   Care discussed with: admitting provider       Medications Ordered in ED Medications  acetaminophen  (TYLENOL ) tablet 650 mg (has no administration in time range)    Or  acetaminophen  (TYLENOL ) suppository 650 mg (has no administration in time range)  ketorolac  (TORADOL ) 15 MG/ML injection 15 mg (has no administration in time range)   lactated ringers  bolus 1,000 mL (0 mLs Intravenous Stopped 10/28/23 1928)  acetaminophen  (TYLENOL ) tablet 1,000 mg (1,000 mg Oral Given 10/28/23 1810)  prochlorperazine  (COMPAZINE ) injection 10 mg (10 mg Intravenous Given 10/28/23 1810)  diphenhydrAMINE  (BENADRYL ) injection 25 mg (25 mg Intravenous Given 10/28/23 1810)  magnesium  sulfate IVPB 2 g 50 mL (0 g Intravenous Stopped 10/28/23 1842)  piperacillin -tazobactam (ZOSYN ) IVPB 3.375 g (0 g Intravenous Stopped 10/28/23 2114)    ED Course/ Medical Decision Making/ A&P                          Medical Decision Making Amount and/or Complexity of Data Reviewed Labs: ordered. Decision-making details documented in ED Course. Radiology: ordered. Decision-making details documented in ED Course.  Risk OTC drugs. Prescription drug management. Decision regarding hospitalization.    This patient presents to the ED for concern of multiple complaints, this involves an extensive number of treatment options, and is a complaint that carries with it a high risk of complications and morbidity.  I considered the following differential and admission for this acute, potentially life threatening condition.   MDM:    Patient is overall very well-appearing, HDS, non-toxic, afebrile.   Unlikely SAH: headache is non thunderclap Unlikely Subdural/epidural hematoma: no history of trauma, no anticoagulation. Unlikely Temporal arteritis: pt < 17 years old.  no tenderness in temporal area Unlikely Acute angle glaucoma: PERRLA, no eye pain. Unlikely Carbon Monoxide Poisoning: no other house members with similar symptoms.  Patient has never had a headache like this before and therefore obtained a CT head which was reassuringly negative for ICH or significantly increased intracranial pressure. Overall lower c/f meningitis, and patient's CTH shows likely acute sinusitis. Her symptoms still could be viral in nature with headache caused by sinusitis. However, her urine is  positive for UTI. No CVA TTP, overall lower c/f pyelonephritis. She is SIRS positive in s/o UTI and so will admit to hospital.  Chest x-ray reassuringly negative for pulmonary edema or pneumonia.    Clinical Course as of 10/28/23 2117  Mon Oct 28, 2023  1741 PlateletsROLLEN):  121 Consistent with known chronic thrombocytopenia [HN]  1741 Hemoglobin(!): 8.4 Consistent with chronic anemia [HN]  1741 WBC(!): 13.6 Mild leukocytosis, but patient seems to always have mild leukocytosis. [HN]  1742 DG Chest 2 View No acute cardiopulmonary process. [HN]  1828 B Natriuretic Peptide(!): 204.2 Mildly elevated but less than priors [HN]  1910 Anion gap: 7 No AG, no c/f DKA [HN]  1911 Troponin I (High Sensitivity): 13 neg [HN]  1911 Resp panel by RT-PCR (RSV, Flu A&B, Covid) Anterior Nasal Swab neg [HN]  1911 Preg, Serum: NEGATIVE neg [HN]  1953 Urinalysis, Routine w reflex microscopic -Urine, Clean Catch(!) +grossly positive for UTI. Calling code sepsis with patient's HR and WBC. Will get culture and give IV zosyn  based on prior culture data. Will admit to hospitalist. [HN]  2116 Discussed with patient her results. She states she hasn't been told before that she has sinus disease but endorses facial pain and states she has been having rhinorrhea. Consider also sinusitis as cause of her headache. CTH negative for ICH so will add toradol  for pain control. Admitted to hospitalist. [HN]    Clinical Course User Index [HN] Franklyn Sid SAILOR, MD    Labs: I Ordered, and personally interpreted labs.  The pertinent results include: Those listed above  Imaging Studies ordered: I ordered imaging studies including CT head, chest x-ray I independently visualized and interpreted imaging. I agree with the radiologist interpretation  Additional history obtained from chart review.   Cardiac Monitoring: The patient was maintained on a cardiac monitor.  I personally viewed and interpreted the cardiac monitored  which showed an underlying rhythm of: Normal sinus rhythm  Reevaluation: After the interventions noted above, I reevaluated the patient and found that they have :improved  Social Determinants of Health:  lives independently  Disposition:  Admit to hospitalist  Co morbidities that complicate the patient evaluation  Past Medical History:  Diagnosis Date   Anemia    Arthritis 08/17/2020   Chronic back pain    Diabetes mellitus without complication (HCC)    type 2   GERD (gastroesophageal reflux disease)    Hypertension    Migraine    Ovarian cyst    Pneumonia    Polyneuropathy      Medicines Meds ordered this encounter  Medications   lactated ringers  bolus 1,000 mL   acetaminophen  (TYLENOL ) tablet 1,000 mg   prochlorperazine  (COMPAZINE ) injection 10 mg   diphenhydrAMINE  (BENADRYL ) injection 25 mg   magnesium  sulfate IVPB 2 g 50 mL   piperacillin -tazobactam (ZOSYN ) IVPB 3.375 g    Antibiotic Indication::   Sepsis   OR Linked Order Group    acetaminophen  (TYLENOL ) tablet 650 mg    acetaminophen  (TYLENOL ) suppository 650 mg   ketorolac  (TORADOL ) 15 MG/ML injection 15 mg    I have reviewed the patients home medicines and have made adjustments as needed  Problem List / ED Course: Problem List Items Addressed This Visit       Genitourinary   * (Principal) UTI (urinary tract infection) - Primary   Other Visit Diagnoses       Acute nonintractable headache, unspecified headache type       Relevant Medications   acetaminophen  (TYLENOL ) tablet 1,000 mg (Completed)   amitriptyline  (ELAVIL ) 10 MG tablet   oxyCODONE -acetaminophen  (PERCOCET) 7.5-325 MG tablet   acetaminophen  (TYLENOL ) tablet 650 mg   acetaminophen  (TYLENOL ) suppository 650 mg   ketorolac  (TORADOL ) 15 MG/ML injection 15 mg (Start on 10/28/2023  9:30 PM)  Acute sinusitis, recurrence not specified, unspecified location       Relevant Medications   diphenhydrAMINE  (BENADRYL ) injection 25 mg (Completed)    piperacillin -tazobactam (ZOSYN ) IVPB 3.375 g (Completed)   amoxicillin -clavulanate (AUGMENTIN ) 500-125 MG tablet                   This note was created using dictation software, which may contain spelling or grammatical errors.    Franklyn Sid SAILOR, MD 10/28/23 [phone]

## 2023-10-28 NOTE — Assessment & Plan Note (Signed)
 Possibly due to fatty liver disease. Negative hepatitis B and C Negative CMV HIV parvovirus Negative BCR ABL1 FISH, JAK2 mutation. ?  Evan syndrome Repeat ultrasound abdomen.

## 2023-10-28 NOTE — Telephone Encounter (Signed)
 received critical lab from Luke Horn at [facility]. Glucose 511. Dr. babara notifed.

## 2023-10-28 NOTE — Sepsis Progress Note (Signed)
 Elink following for sepsis protocol.

## 2023-10-28 NOTE — ED Triage Notes (Signed)
 Here by POV from home for illness, onset 2 weeks ago, no improvement, gradually progressively worse, including: HA, cough, congestion, chest hurts, sob, blood sugar running high, and legs swelling. No relief with gabapentin , percocet, musle relaxer, BP meds, insulin  and tylenol . Alert, NAD, calm, interactive, resps e/u, speaking in clear complete sentences. VSS. HR 102.

## 2023-10-28 NOTE — Assessment & Plan Note (Signed)
 Lab Results  Component Value Date   HGB 8.4 (L) 10/28/2023   TIBC 353 07/23/2023   IRONPCTSAT 26 07/23/2023   FERRITIN 206 07/23/2023    Adequate iron  level.  Hold off Venofer  treatments.

## 2023-10-28 NOTE — Assessment & Plan Note (Signed)
 Patient was sent to emergency room for further evaluation of facial pain, headache.  CT head showed acute sinusitis.  She is currently admitted to hospital for treatment of acute infection.

## 2023-10-28 NOTE — Assessment & Plan Note (Signed)
 S/p vitamin B12 injections, she did not tolerate due to muscle cramps.  B12 level stable continue sublingual B12 daily.

## 2023-10-28 NOTE — H&P (Signed)
 History and Physical      Melinda Stone:[id number] DOB: [birth year] DOA: 10/28/2023; DOS: 10/28/2023  PCP: Melinda Shuck, NP  Patient coming from: home   I have personally briefly reviewed patient's old medical records in [facility]  Chief Complaint: Subjective fever  HPI: Melinda Stone is a 43 y.o. female with medical history significant for type 2 diabetes mellitus, anemia of chronic disease with baseline hemoglobin 7-9, moderate persistent asthma, chronic lower extremity edema, chronic back pain, who is admitted to [facility] on 10/28/2023 with sepsis due to suspected acute cystitis after presenting from home to [facility] ED complaining of subjective fever.   The patient reports 1 to 2 months of subjective fever in the setting of generalized myalgias, in the absence of chills or focal rigors.With any nausea, vomiting, diarrhea.  She notes some suprapubic tenderness over the time period, but denies overt dysuria or gross hematuria.  Denies any new cough or any new rash.  She has been having headache recently, but in the absence of any chest pain or neck stiffness . she has chronic low back pain without any acute worsening thereof.     ED Course:  Vital signs in the ED were notable for the following: Gentamicin 9.7; heart rates in the low 100s; systolic pressures 120s 150; respiratory rate 16-25, oxygen saturation 98 to 100% on room air.  Labs were notable for the following: CMP notable for creatinine 1.09 compared to 1.29 on 10/24/2023, glucose 235, anion gap 7, calcium  just for mild anemia and 9.6, albumin 2.7, liver enzymes are otherwise within normal limits.  CBC notable for open cell count 30,600 with 82% neutrophils, hemoglobin 8.2 compared to 8.1 on 825.  Lactate 1.3.  Urinalysis with associated cloudy appearing specimen, greater than 50 blood cells, large leukocyte esterase.  BNP 204 compared to 1082 on 05/09/2023.  Qualitative beta-hCG was negative.  Blood  cultures x 2 and urine culture collected prior to initiation of IV antibiotics.  COVID, influenza, RSV PCR were all negative.  Per my interpretation, EKG in ED demonstrated the following: Compared to most recent prior EKG from 10/25/2023, today's EKG shows sinus tach with heart rate 106, normal Ulspira evidence of T wave changes, and was subtle millimeter ST elevation limited to V2, which appears unchanged most recent prior EKG  Imaging in the ED, per corresponding formal radiology read, was notable for the following: CT head showed nones of acute intracranial abnormality.  2 view chest x-ray showed nones of acute cardiopulmonary process, including no evidence of filtrate or edema.  While in the ED, the following were administered: For her headache she received 25 mg IV Benadryl  x 1, Toradol  50 mg IV x 1, Compazine  10 mg IV x 1, lactate rest of lab was acetaminophen  1 g p.o. x 1.  Additionally, she received Zosyn  and 2 g of IV magnesium  sulfate.  Subsequently, the patient was admitted for further evaluation management presenting sepsis due to suspected acute cystitis.     Review of Systems: As per HPI otherwise 10 point review of systems negative.   Past Medical History:  Diagnosis Date   Anemia    Arthritis 08/17/2020   Chronic back pain    Diabetes mellitus without complication (HCC)    type 2   GERD (gastroesophageal reflux disease)    Hypertension    Migraine    Ovarian cyst    Pneumonia    Polyneuropathy     Past Surgical History:  Procedure Laterality  Date   CESAREAN SECTION     CHOLECYSTECTOMY  2004   EYE SURGERY Left    cataract   HYSTEROSCOPY N/A 10/01/2023   Procedure: ABLATION, ENDOMETRIUM, HYSTEROSCOPIC;  Surgeon: Alger Gong, MD;  Location: [facility] OR;  Service: Gynecology;  Laterality: N/A;   IR BONE MARROW BIOPSY & ASPIRATION  08/01/2023   LUMBAR LAMINECTOMY N/A 05/30/2020   Procedure: Lumbar five Laminectomy,  Bilateral Microdiscectomy, Left Lumbar five -Sacral one  Microdiscectomy;  Surgeon: Barbarann Oneil BROCKS, MD;  Location: [facility] OR;  Service: Orthopedics;  Laterality: N/A;   LUMBAR LAMINECTOMY Left 11/14/2020   Procedure: LEFT LUMBAR FOUR-FIVE MICRODISCECTOMY;  Surgeon: Barbarann Oneil BROCKS, MD;  Location: [facility] OR;  Service: Orthopedics;  Laterality: Left;   TUBAL LIGATION  10/19/2010    Social History:  reports that she quit smoking about 22 months ago. Her smoking use included cigars. She has never used smokeless tobacco. She reports that she does not currently use alcohol . She reports that she does not currently use drugs.   Allergies  Allergen Reactions   Trulicity  [Dulaglutide ] Diarrhea    Family History  Problem Relation Age of Onset   Diabetes Mother    Hypertension Mother    Stroke Mother     Family history reviewed and not pertinent    Prior to Admission medications   Medication Sig Start Date End Date Taking? Authorizing Provider  acetaminophen  (TYLENOL ) 500 MG tablet Take 1,000 mg by mouth every 6 (six) hours as needed for mild pain (pain score 1-3) or moderate pain (pain score 4-6).   Yes [provider]  albuterol  (VENTOLIN  HFA) 108 (90 Base) MCG/ACT inhaler Inhale 2 puffs into the lungs every 6 (six) hours as needed for wheezing or shortness of breath. 05/29/23  Yes Setzer, Nena PARAS, PA-C  amitriptyline  (ELAVIL ) 10 MG tablet Take 10 mg by mouth at bedtime. 10/22/23  Yes [provider]  amoxicillin -clavulanate (AUGMENTIN ) 500-125 MG tablet Take 500 mg by mouth in the morning and at bedtime. 10/18/23  Yes [provider]  Continuous Glucose Sensor (DEXCOM G7 SENSOR) MISC 1 Device by Does not apply route as directed. Change sensor every 10 days Patient taking differently: Inject 1 Device into the skin See admin instructions. Change sensor every 10 days 06/21/23  Yes Shamleffer, Ibtehal Jaralla, MD  Cyanocobalamin  (VITAMIN B-12) 2500 MCG SUBL Place 1 tablet (2,500 mcg total) under the tongue daily. 04/23/23  Yes Babara Call, MD   ferrous sulfate  325 (65 FE) MG tablet Take 1 tablet (325 mg total) by mouth daily with breakfast. 05/29/23  Yes Setzer, Sandra J, PA-C  furosemide  (LASIX ) 20 MG tablet Take 1 tablet (20 mg total) by mouth in the morning. 07/24/23  Yes Tolia, Sunit, DO  gabapentin  (NEURONTIN ) 800 MG tablet Take 800 mg by mouth 3 (three) times daily.   Yes [provider]  Galcanezumab -gnlm (EMGALITY ) 120 MG/ML SOAJ Inject 120 mg into the skin every 30 (thirty) days. 08/20/23  Yes Gayland Lauraine PARAS, NP  ibuprofen  (ADVIL ) 600 MG tablet Take 1 tablet (600 mg total) by mouth every 6 (six) hours as needed. 10/01/23  Yes Constant, Peggy, MD  Insulin  Disposable Pump (OMNIPOD 5 G7 INTRO, GEN 5,) KIT 1 Device by Does not apply route every other day. 09/26/23  Yes Shamleffer, Donell Cardinal, MD  Insulin  Disposable Pump (OMNIPOD 5 G7 PODS, GEN 5,) MISC 1 Device by Does not apply route every other day. 09/26/23  Yes Shamleffer, Ibtehal Jaralla, MD  insulin  glargine (LANTUS  SOLOSTAR) 100  UNIT/ML Solostar Pen Inject 45 Units into the skin daily before breakfast. 09/26/23  Yes Shamleffer, Ibtehal Jaralla, MD  insulin  lispro (HUMALOG  KWIKPEN) 100 UNIT/ML KwikPen Inject 12-22 Units into the skin 3 (three) times daily. Max daily 09/26/23  Yes Shamleffer, Ibtehal Jaralla, MD  ketorolac  (ACULAR ) 0.5 % ophthalmic solution Place 1 drop into the right eye 4 (four) times daily. 10/24/23  Yes [provider]  losartan  (COZAAR ) 25 MG tablet Take 1 tablet (25 mg total) by mouth daily. 07/16/23  Yes Tolia, Sunit, DO  metoprolol  tartrate (LOPRESSOR ) 25 MG tablet Take 1 tablet (25 mg total) by mouth 2 (two) times daily. 05/29/23 10/28/23 Yes Setzer, Sandra J, PA-C  mometasone -formoterol  (DULERA ) 100-5 MCG/ACT AERO Inhale 2 puffs into the lungs 2 (two) times daily at 10 AM and 5 PM. 05/29/23  Yes Setzer, Sandra J, PA-C  Multiple Vitamin (MULTIVITAMIN WITH MINERALS) TABS tablet Take 1 tablet by mouth daily. 05/29/23  Yes Setzer, Sandra J, PA-C   ofloxacin (OCUFLOX) 0.3 % ophthalmic solution Place 1 drop into the right eye 4 (four) times daily. 10/24/23  Yes [provider]  oxyCODONE -acetaminophen  (PERCOCET) 7.5-325 MG tablet Take 1 tablet by mouth 4 (four) times daily as needed for moderate pain (pain score 4-6). 10/22/23  Yes [provider]  pantoprazole  (PROTONIX ) 40 MG tablet Take 1 tablet (40 mg total) by mouth daily. 05/29/23  Yes Setzer, Sandra J, PA-C  prednisoLONE acetate (PRED FORTE) 1 % ophthalmic suspension Place 1 drop into the right eye 4 (four) times daily. 10/24/23  Yes [provider]  Rimegepant Sulfate (NURTEC) 75 MG TBDP Take 1 tablet (75 mg total) by mouth as needed. Take 1 tablet at onset of headache, max is 1 tablet in 24 hours. 08/20/23  Yes Gayland Lauraine PARAS, NP  tiZANidine  (ZANAFLEX ) 4 MG tablet Take 4 mg by mouth every 12 (twelve) hours as needed for muscle spasms. 06/10/23  Yes [provider]  vitamin C  (ASCORBIC ACID ) 250 MG tablet Take 2 tablets (500 mg total) by mouth daily. 04/10/22  Yes Babara Call, MD  cephALEXin  (KEFLEX ) 500 MG capsule Take 1 capsule (500 mg total) by mouth 4 (four) times daily. Patient not taking: Reported on 10/28/2023 10/10/23   Palumbo, April, MD  Insulin  Pen Needle 32G X 4 MM MISC 1 Device by Does not apply route in the morning, at noon, in the evening, and at bedtime. 09/26/23   Shamleffer, Ibtehal Jaralla, MD  oxyCODONE -acetaminophen  (PERCOCET/ROXICET) 5-325 MG tablet Take 1 tablet by mouth every 6 (six) hours as needed. Patient not taking: Reported on 10/28/2023 10/01/23   Constant, Winton, MD     Objective    Physical Exam: Vitals:   10/28/23 1745 10/28/23 1800 10/28/23 1815 10/28/23 2115  BP: (!) 159/92 (!) 156/80 (!) 148/75   Pulse: (!) 108 (!) 107 (!) 109   Resp: (!) 25 20 (!) 21   Temp:    99.7 F (37.6 C)  TempSrc:    Oral  SpO2: 100% 100% 100%   Weight:        General: appears to be stated age; alert, oriented Skin: warm, dry, no rash Head:   AT/[place] Mouth:  Oral mucosa membranes appear moist, normal dentition Neck: supple; trachea midline Heart: Mildly tachycardic, but regular; did not appreciate any M/R/G Lungs: CTAB, did not appreciate any wheezes, rales, or rhonchi Abdomen: + BS; soft, ND, NT Vascular: 2+ pedal pulses b/l; 2+ radial pulses b/l Extremities: no peripheral edema, no muscle wasting  Labs  on Admission: I have personally reviewed following labs and imaging studies  CBC: Recent Labs  Lab 10/25/23 0028 10/28/23 1319 10/28/23 1557  WBC 10.9* 11.3* 13.6*  NEUTROABS  --  9.3* 11.2*  HGB 8.1* 7.5* 8.4*  HCT 24.4* 22.0* 26.1*  MCV 88.7 87.3 92.2  PLT 116* 98* 121*   Basic Metabolic Panel: Recent Labs  Lab 10/24/23 2044 10/25/23 0028 10/28/23 1319 10/28/23 1755  NA 133*  --  130* 137  K 5.4* 4.8 4.8 4.4  CL 107  --  106 110  CO2 17*  --  18* 20*  GLUCOSE 504*  --  511* 235*  BUN 32*  --  18 16  CREATININE 1.29*  --  0.99 1.09*  CALCIUM  8.6*  --  8.1* 8.6*   GFR: Estimated Creatinine Clearance: 87.2 mL/min (A) (by C-G formula based on SCr of 1.09 mg/dL (H)). Liver Function Tests: Recent Labs  Lab 10/28/23 1319 10/28/23 1755  AST 16 17  ALT 18 18  ALKPHOS 108 107  BILITOT 0.8 0.8  PROT 5.8* 6.0*  ALBUMIN 2.6* 2.7*   Recent Labs  Lab 10/28/23 1755  LIPASE 30   No results for input(s): AMMONIA in the last 168 hours. Coagulation Profile: No results for input(s): INR, PROTIME in the last 168 hours. Cardiac Enzymes: No results for input(s): CKTOTAL, CKMB, CKMBINDEX, TROPONINI in the last 168 hours. BNP (last 3 results) Recent Labs    07/16/23 1040  PROBNP 1,695*   HbA1C: No results for input(s): HGBA1C in the last 72 hours. CBG: Recent Labs  Lab 10/24/23 2323 10/25/23 0123 10/25/23 0355  GLUCAP >600* 506* 399*   Lipid Profile: No results for input(s): CHOL, HDL, LDLCALC, TRIG, CHOLHDL, LDLDIRECT in the last 72 hours. Thyroid  Function Tests: No  results for input(s): TSH, T4TOTAL, FREET4, T3FREE, THYROIDAB in the last 72 hours. Anemia Panel: Recent Labs    10/28/23 1319  RETICCTPCT 12.4*   Urine analysis:    Component Value Date/Time   COLORURINE YELLOW 10/28/2023 1924   APPEARANCEUR CLOUDY (A) 10/28/2023 1924   LABSPEC 1.014 10/28/2023 1924   PHURINE 5.0 10/28/2023 1924   GLUCOSEU >=500 (A) 10/28/2023 1924   HGBUR LARGE (A) 10/28/2023 1924   BILIRUBINUR NEGATIVE 10/28/2023 1924   BILIRUBINUR negative 07/06/2023 1033   KETONESUR NEGATIVE 10/28/2023 1924   PROTEINUR >=300 (A) 10/28/2023 1924   UROBILINOGEN 0.2 07/06/2023 1033   NITRITE NEGATIVE 10/28/2023 1924   LEUKOCYTESUR LARGE (A) 10/28/2023 1924    Radiological Exams on Admission: CT Head Wo Contrast Result Date: 10/28/2023 CLINICAL DATA:  Headache. EXAM: CT HEAD WITHOUT CONTRAST TECHNIQUE: Contiguous axial images were obtained from the base of the skull through the vertex without intravenous contrast. RADIATION DOSE REDUCTION: This exam was performed according to the departmental dose-optimization program which includes automated exposure control, adjustment of the mA and/or kV according to patient size and/or use of iterative reconstruction technique. COMPARISON:  May 18, 2023 FINDINGS: Brain: No evidence of acute infarction, hemorrhage, hydrocephalus, extra-axial collection or mass lesion/mass effect. Vascular: No hyperdense vessel or unexpected calcification. Skull: A chronic fracture deformity is seen involving the right zygomatic arch. Sinuses/Orbits: There is marked severity bilateral ethmoid sinus mucosal thickening. Moderate severity sphenoid sinus mucosal thickening is also seen. Small to moderate size bilateral maxillary sinus air-fluid levels are noted. Other: None. IMPRESSION: 1. No acute intracranial abnormality. 2. Marked severity bilateral ethmoid sinus and moderate severity sphenoid sinus disease. 3. Small to moderate size bilateral maxillary sinus  air-fluid levels, consistent  with acute sinusitis. Electronically Signed   By: Suzen Dials M.D.   On: 10/28/2023 20:08   DG Chest 2 View Result Date: 10/28/2023 CLINICAL DATA:  Short of breath, cough EXAM: CHEST - 2 VIEW COMPARISON:  None Available. FINDINGS: Normal mediastinum and cardiac silhouette. Normal pulmonary vasculature. No evidence of effusion, infiltrate, or pneumothorax. No acute bony abnormality. IMPRESSION: No acute cardiopulmonary process. Electronically Signed   By: Jackquline Boxer M.D.   On: 10/28/2023 17:25      Assessment/Plan   Principal Problem:   UTI (urinary tract infection) Active Problems:   Anemia of chronic disease   Essential hypertension   Sepsis (HCC)   DM2 (diabetes mellitus, type 2) (HCC)   Leukocytosis   Tension headache   Moderate persistent asthma      #) Sepsis due to urinary tract infection: In setting of 1 to 2 weeks of subjective fever, generalized myalgias, presenting leukocytosis with neutrophilic predominance, suprapubic tenderness, and urinalysis associated with cloudy appearing specimen, significant pyuria, and large leukocyte esterase, suggestive of acute cystitis.  SIRS criteria met via leukocytosis, tachycardia, tachypnea. Of note, in the absence of any evidence of end organ damage, including a non-elevated presenting LA of 1.3, pt's sepsis does not meet criteria to be considered severe in nature. Also, in the absence of LA level greater than or equal to 4.0, and in the absence of any associated hypotension refractory to IVF's, there are no indications for administration of a 30 mL/kg IVF bolus at this time.   Additional ED work-up/management notable for: Blood cultures x 2 as well as urine culture were collected prior to initiation of Zosyn .  For now, will de-escalate to Rocephin , closely monitor for results of the above cultures.  Given the somewhat vague nature of her acute urinary symptoms, may also ultimately benefit from wet  prep eval.   No e/o additional infectious process at this time, including chest x-ray which showed no evidence of acute Croop or process completed today noted to suggest pneumonia.  COVID, Lenze, RSV PCR all negative.  Plan: CBC w/ diff and CMP in AM. Follow for results of blood cx's x 2.  Resolved urine culture.  Rocephin , as above.  Chavers at 125 cc/h x 10 hours.  Add on procalcitonin level.                     #) Type 2 Diabetes Mellitus: documented history of such. Home insulin  regimen: Lantus  45 units subcu every morning in addition to Humalog  3 times daily with meals. Home oral hypoglycemic agents: None. presenting blood sugar: 235. Most recent A1c noted to be 7.5% when checked on 10/01/2023.  Her history of diabetes this is complicated by peripheral neuropathy for which she is on amitriptyline  as well as gabapentin  at home.  Plan: accuchecks QAC and HS with low dose SSI.  Lantus  15 units subcu every morning.  Resume home gabapentin  and amitriptyline .                      #) Anemia of chronic disease: Documented history of such, a/w with baseline hgb range 7-9, with presenting hgb consistent with this range, in the absence of any overt evidence of active bleed.     Plan: Repeat CBC in the morning.                       #) Essential Hypertension: documented h/o such, with outpatient antihypertensive regimen including losartan .  SBP's in the ED today: 120s to 150s mmHg.   Plan: Close monitoring of subsequent BP via routine VS. resume home losartan .                      #) Moderate persistent asthma: documented history thereof, without clinical e/o to suggest current exacerbation. Outpatient respiratory regimen includes scheduled Dulera , as needed albuterol  inhaler.   Plan: Check serum magnesium  level. Continue home Dulera .  Prn albuterol  nebulizer..                    # Headache: Presents with  symmetrical frontal lobe headache without scintillating cytoma or evidence of visual or olfactory aura.  While patient does have a history of migraines, this presenting headache appears more consistent with tension headache that has improved following headache cocktail that included acetaminophen , Toradol , Compazine , IV fluids, IV magnesium  and a dose of IV Benadryl , as above.  Evidence of associated acute focal neurologic deficits.  No associated neck stiffness to increase index suspicion for meningitis at this time.  Plan: As needed acetaminophen .  Prn IV Toradol ..  IV Zofran .  prn IV Compazine  for nausea/vomiting refractory to Zofran .  IV fluids, as above.       DVT prophylaxis: SCD's   Code Status: Full code Family Communication: none Disposition Plan: Per Rounding Team Consults called: none;  Admission status: Observation     I SPENT GREATER THAN 75  MINUTES IN CLINICAL CARE TIME/MEDICAL DECISION-MAKING IN COMPLETING THIS ADMISSION.      Eva NOVAK Seleste Tallman DO [facility]  From 7PM - 7AM   10/28/2023, 9:24 PM

## 2023-10-28 NOTE — Progress Notes (Signed)
 ED Pharmacy Antibiotic Sign Off An antibiotic consult was received from an ED provider for zosyn  per pharmacy dosing for sepsis. A chart review was completed to assess appropriateness.   The following one time order(s) were placed:  Zosyn  3.375g IV x1  Further antibiotic and/or antibiotic pharmacy consults should be ordered by the admitting provider if indicated.   Thank you for allowing pharmacy to be a part of this patient's care.   Dorn Poot, [facility]  Clinical Pharmacist 10/28/23 7:57 PM

## 2023-10-28 NOTE — Assessment & Plan Note (Addendum)
 Refractory to blood transfusion.  low haptoglobin, normal Ldh, normal bilirubin.  no M protein on myeloma panel, negative parvo virus DNA PCR, CMV ? Ineffective erythropoiesis vs hemolysis. 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 Suspect secondary process. Normal cytogenetics, normal myeloid NGS  She also has decreased platelet counts and previously responded to steroids.  Negative ANA, negative CRP, CCP, positive ESR, negative cold agglutinin. Steroid has helped maintaining her hemoglobin and she has not needed blood transfusion since end of June 2025.  Today hemoglobin dropped again.  It is unclear if she has been taking steroids as instructed. She has experienced side effects from chronic steroid treatments-significantly increased glucose level to 500s.  She is going to emergency room for further evaluation. Current acute drop of hemoglobin, possibly secondary to acute infection.  Regardless, long-term steroids is not an ideal treatments for her.  Recommend tapering down steroids. Elevated ESR, joint pain, unknown etiology.  Refer to rheumatology for further evaluation.  anemia secondary to chronic kidney disease may also contribute to anemia.  Consider erythropoietin therapy

## 2023-10-28 NOTE — Progress Notes (Signed)
 Hematology/Oncology Progress note [phone] [phone]     Patient Care Team: [name], NP as PCP - General (Nurse Practitioner) Michele Richardson, DO as PCP - Cardiology (Cardiology) Babara Call, MD as Consulting Physician (Oncology)  ASSESSMENT & PLAN:   Normocytic anemia Refractory to blood transfusion.  low haptoglobin, normal Ldh, normal bilirubin.  no M protein on myeloma panel, negative parvo virus DNA PCR, CMV ? Ineffective erythropoiesis vs hemolysis. 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 Suspect secondary process. Normal cytogenetics, normal myeloid NGS  She also has decreased platelet counts and previously responded to steroids.  Negative ANA, negative CRP, CCP, positive ESR, negative cold agglutinin. Steroid has helped maintaining her hemoglobin and she has not needed blood transfusion since end of June 2025.  Today hemoglobin dropped again.  It is unclear if she has been taking steroids as instructed. She has experienced side effects from chronic steroid treatments-significantly increased glucose level to 500s.  She is going to emergency room for further evaluation. Current acute drop of hemoglobin, possibly secondary to acute infection.  Regardless, long-term steroids is not an ideal treatments for her.  Recommend tapering down steroids. Elevated ESR, joint pain, unknown etiology.  Refer to rheumatology for further evaluation.  anemia secondary to chronic kidney disease may also contribute to anemia.  Consider erythropoietin therapy  Hepatosplenomegaly Possibly due to fatty liver disease. Negative hepatitis B and C Negative CMV HIV parvovirus Negative BCR ABL1 FISH, JAK2 mutation. ?  Evan syndrome Repeat ultrasound abdomen.  IDA (iron  deficiency anemia) Lab Results  Component Value Date    HGB 8.4 (L) 10/28/2023   TIBC 353 07/23/2023   IRONPCTSAT 26 07/23/2023   FERRITIN 206 07/23/2023    Adequate iron  level.  Hold off Venofer  treatments.  Low serum vitamin B12 S/p vitamin B12 injections, she did not tolerate due to muscle cramps.  B12 level stable continue sublingual B12 1000mcg daily.    Acute sinusitis Patient was sent to emergency room for further evaluation of facial pain, headache.  CT head showed acute sinusitis.  She is currently admitted to hospital for treatment of acute infection.   Orders Placed This Encounter  Procedures   US  Abdomen Complete    Standing Status:   Future    Expected Date:   10/28/2023    Expiration Date:   10/27/2024    Reason for Exam (SYMPTOM  OR DIAGNOSIS REQUIRED):   hepatosplenomegaly    Preferred imaging location?:   [facility]   Ambulatory referral to Rheumatology    Referral Priority:   Routine    Referral Type:   Consultation    Referral Reason:   Specialty Services Required    Requested Specialty:   Rheumatology    Number of Visits Requested:   1    Follow-up per LOS  All questions were answered. The patient knows to call the clinic with any problems, questions or concerns.  Call Babara, MD, PhD [facility] Hematology Oncology 10/28/2023   CHIEF COMPLAINTS/REASON FOR VISIT:  iron  deficiency anemia  HISTORY OF PRESENTING ILLNESS:   Melinda Stone is a  43 y.o.  female presents for iron  deficiency anemia.  Patient had blood work done on 07/13/2021, CBC showed hemoglobin of 9.0, MCV 75.3, ferritin 14, iron  saturation 21, TIBC 455.  Reviewed her previous lab records.  Patient has chronic anemia since at least 2019.  + Nausea, no vomiting.  Chronic diarrhea after each meal.  Unintentional weight loss Patient reports that she has had colonoscopy done in May which did not reveal etiology of diarrhea.  Colonoscopy record was not available to me at the time of dictation. Chronic numbness and tingling of bilateral  lower extremity secondary to polyneuropathy.  05/11/2023 - 05/23/2023 Patient was hospitalized due to hypoxic respiratory failure, influenza A, ARDS, pneumonia and she was discharged to rehab after hospitalization.  INTERVAL HISTORY Linzie L Goodness is a 43 y.o. female who has above history reviewed by me today presents for follow up visit for iron  deficiency anemia Patient takes oral vitamin B12 supplementation at 1000 mcg daily. Chronic fatigue , unchanged. No fever, chills, unintentional weight loss, night sweats.  Denies hematochezia, hematuria, hematemesis, epistaxis, black tarry stool or easy bruising.  Status post Hysteroscopy, Dilation and Curettage, Hydrothermal Endometrial Ablation. Endometrial biopsy negative for malignancy.   Today patient reports feeling poorly.  She has a severe headache, her left face hurts.  No fever or chills.  Recent ER visit due to wheezing, cough and x-ray was nonremarkable..  Will also noticed to have hyper glycemia.  She was no longer on insulin  pump.  She uses subcutaneous sliding scale. She has been taking prednisone  40 mg daily since end of June.  Her hemoglobin and platelet counts have improved initially after started on steroids.  Glycemic control is very poor.  Review of Systems  Constitutional:  Positive for fatigue. Negative for chills and fever.  HENT:   Negative for hearing loss and voice change.        Facial pain  Eyes:  Negative for eye problems.  Respiratory:  Positive for cough and wheezing. Negative for chest tightness.   Cardiovascular:  Negative for chest pain.  Gastrointestinal:  Negative for abdominal distention, abdominal pain, blood in stool, diarrhea, nausea and vomiting.  Endocrine: Negative for hot flashes.  Genitourinary:  Negative for difficulty urinating and frequency.   Musculoskeletal:  Positive for back pain. Negative for arthralgias.  Skin:  Negative for itching and rash.  Neurological:  Positive for headaches and  numbness. Negative for extremity weakness.  Hematological:  Negative for adenopathy.  Psychiatric/Behavioral:  Negative for confusion.     MEDICAL HISTORY:  Past Medical History:  Diagnosis Date   Anemia    Arthritis 08/17/2020   Chronic back pain    Diabetes mellitus without complication (HCC)    type 2   GERD (gastroesophageal reflux disease)    Hypertension    Migraine    Ovarian cyst    Pneumonia    Polyneuropathy     SURGICAL HISTORY: Past Surgical History:  Procedure Laterality Date   CESAREAN SECTION     CHOLECYSTECTOMY  2004   EYE SURGERY Left    cataract   HYSTEROSCOPY N/A 10/01/2023   Procedure: ABLATION, ENDOMETRIUM, HYSTEROSCOPIC;  Surgeon: Alger Gong, MD;  Location: [facility] OR;  Service: Gynecology;  Laterality: N/A;   IR BONE MARROW BIOPSY & ASPIRATION  08/01/2023   LUMBAR LAMINECTOMY N/A 05/30/2020   Procedure: Lumbar five Laminectomy,  Bilateral Microdiscectomy, Left Lumbar five -Sacral one Microdiscectomy;  Surgeon: Barbarann Oneil BROCKS, MD;  Location: [facility] OR;  Service: Orthopedics;  Laterality: N/A;   LUMBAR LAMINECTOMY Left 11/14/2020   Procedure: LEFT LUMBAR FOUR-FIVE MICRODISCECTOMY;  Surgeon: Barbarann Oneil BROCKS, MD;  Location: [facility] OR;  Service: Orthopedics;  Laterality: Left;   TUBAL LIGATION  10/19/2010    SOCIAL HISTORY: Social History   Socioeconomic History   Marital status: Single  Spouse name: Not on file   Number of children: 3   Years of education: Not on file   Highest education level: High school graduate  Occupational History    Comment: home maker  Tobacco Use   Smoking status: Former    Types: Cigars    Quit date: 12/23/2021    Years since quitting: 1.8   Smokeless tobacco: Never   Tobacco comments:    2 cigars daily. Khj 02/12/2023  Vaping Use   Vaping status: Never Used  Substance and Sexual Activity   Alcohol  use: Not Currently   Drug use: Not Currently    Comment: per patient stopped smoking marijuana Mar 2022   Sexual activity:  Yes    Birth control/protection: Surgical  Other Topics Concern   Not on file  Social History Narrative   Lives with 3 children   Some coffee   Right handed   Doesn't work   Social Drivers of Corporate investment banker Strain: Not on file  Food Insecurity: No Food Insecurity (09/05/2023)   Hunger Vital Sign    Worried About Running Out of Food in the Last Year: Never true    Ran Out of Food in the Last Year: Never true  Transportation Needs: No Transportation Needs (09/05/2023)   PRAPARE - Administrator, Civil Service (Medical): No    Lack of Transportation (Non-Medical): No  Physical Activity: Not on file  Stress: Not on file  Social Connections: Moderately Isolated (07/31/2023)   Social Connection and Isolation Panel    Frequency of Communication with Friends and Family: Twice a week    Frequency of Social Gatherings with Friends and Family: Twice a week    Attends Religious Services: 1 to 4 times per year    Active Member of [employer] or Organizations: No    Attends Banker Meetings: Never    Marital Status: Never married  Intimate Partner Violence: Not At Risk (09/05/2023)   Humiliation, Afraid, Rape, and Kick questionnaire    Fear of Current or Ex-Partner: No    Emotionally Abused: No    Physically Abused: No    Sexually Abused: No    FAMILY HISTORY: Family History  Problem Relation Age of Onset   Diabetes Mother    Hypertension Mother    Stroke Mother     ALLERGIES:  is allergic to trulicity  [dulaglutide ].  MEDICATIONS:  No current facility-administered medications for this visit.   Current Outpatient Medications  Medication Sig Dispense Refill   acetaminophen  (TYLENOL ) 500 MG tablet Take 1,000 mg by mouth every 6 (six) hours as needed for mild pain (pain score 1-3) or moderate pain (pain score 4-6).     albuterol  (VENTOLIN  HFA) 108 (90 Base) MCG/ACT inhaler Inhale 2 puffs into the lungs every 6 (six) hours as needed for wheezing or  shortness of breath. 18 g 2   cephALEXin  (KEFLEX ) 500 MG capsule Take 1 capsule (500 mg total) by mouth 4 (four) times daily. (Patient not taking: Reported on 10/28/2023) 28 capsule 0   Continuous Glucose Sensor (DEXCOM G7 SENSOR) MISC 1 Device by Does not apply route as directed. Change sensor every 10 days (Patient taking differently: Inject 1 Device into the skin See admin instructions. Change sensor every 10 days) 9 each 3   Cyanocobalamin  (VITAMIN B-12) 2500 MCG SUBL Place 1 tablet (2,500 mcg total) under the tongue daily. 30 tablet 2   ferrous sulfate  325 (65 FE) MG tablet Take 1 tablet (325  mg total) by mouth daily with breakfast. 30 tablet 0   furosemide  (LASIX ) 20 MG tablet Take 1 tablet (20 mg total) by mouth in the morning. 30 tablet 3   gabapentin  (NEURONTIN ) 800 MG tablet Take 800 mg by mouth 3 (three) times daily.     Galcanezumab -gnlm (EMGALITY ) 120 MG/ML SOAJ Inject 120 mg into the skin every 30 (thirty) days. 1.12 mL 11   ibuprofen  (ADVIL ) 600 MG tablet Take 1 tablet (600 mg total) by mouth every 6 (six) hours as needed. 60 tablet 3   Insulin  Disposable Pump (OMNIPOD 5 G7 INTRO, GEN 5,) KIT 1 Device by Does not apply route every other day. 1 kit 0   Insulin  Disposable Pump (OMNIPOD 5 G7 PODS, GEN 5,) MISC 1 Device by Does not apply route every other day. 45 each 3   insulin  glargine (LANTUS  SOLOSTAR) 100 UNIT/ML Solostar Pen Inject 45 Units into the skin daily before breakfast. 45 mL 3   insulin  lispro (HUMALOG  KWIKPEN) 100 UNIT/ML KwikPen Inject 12-22 Units into the skin 3 (three) times daily. Max daily 60 mL 4   Insulin  Pen Needle 32G X 4 MM MISC 1 Device by Does not apply route in the morning, at noon, in the evening, and at bedtime. 400 each 3   losartan  (COZAAR ) 25 MG tablet Take 1 tablet (25 mg total) by mouth daily. 30 tablet 3   metoprolol  tartrate (LOPRESSOR ) 25 MG tablet Take 1 tablet (25 mg total) by mouth 2 (two) times daily. 60 tablet 0   mometasone -formoterol  (DULERA )  100-5 MCG/ACT AERO Inhale 2 puffs into the lungs 2 (two) times daily at 10 AM and 5 PM. 13 g 0   Multiple Vitamin (MULTIVITAMIN WITH MINERALS) TABS tablet Take 1 tablet by mouth daily.     oxyCODONE -acetaminophen  (PERCOCET/ROXICET) 5-325 MG tablet Take 1 tablet by mouth every 6 (six) hours as needed. (Patient not taking: Reported on 10/28/2023) 20 tablet 0   pantoprazole  (PROTONIX ) 40 MG tablet Take 1 tablet (40 mg total) by mouth daily. 30 tablet 0   Rimegepant Sulfate (NURTEC) 75 MG TBDP Take 1 tablet (75 mg total) by mouth as needed. Take 1 tablet at onset of headache, max is 1 tablet in 24 hours. 8 tablet 11   tiZANidine  (ZANAFLEX ) 4 MG tablet Take 4 mg by mouth every 12 (twelve) hours as needed for muscle spasms.     vitamin C  (ASCORBIC ACID ) 250 MG tablet Take 2 tablets (500 mg total) by mouth daily. 180 tablet 0   amitriptyline  (ELAVIL ) 10 MG tablet Take 10 mg by mouth at bedtime.     amoxicillin -clavulanate (AUGMENTIN ) 500-125 MG tablet Take 500 mg by mouth in the morning and at bedtime.     ketorolac  (ACULAR ) 0.5 % ophthalmic solution Place 1 drop into the right eye 4 (four) times daily.     ofloxacin (OCUFLOX) 0.3 % ophthalmic solution Place 1 drop into the right eye 4 (four) times daily.     oxyCODONE -acetaminophen  (PERCOCET) 7.5-325 MG tablet Take 1 tablet by mouth 4 (four) times daily as needed for moderate pain (pain score 4-6).     prednisoLONE acetate (PRED FORTE) 1 % ophthalmic suspension Place 1 drop into the right eye 4 (four) times daily.     Facility-Administered Medications Ordered in Other Visits  Medication Dose Route Frequency Provider Last Rate Last Admin   0.9 %  sodium chloride  infusion   Intravenous Continuous Howerter, Justin B, DO 125 mL/hr at 10/28/23 2132 New Bag at  10/28/23 2132   acetaminophen  (TYLENOL ) tablet 1,000 mg  1,000 mg Oral Q6H PRN Howerter, Justin B, DO       Or   acetaminophen  (TYLENOL ) suppository 650 mg  650 mg Rectal Q6H PRN Howerter, Justin B, DO        cefTRIAXone  (ROCEPHIN ) 1 g in sodium chloride  0.9 % 100 mL IVPB  1 g Intravenous Q24H Howerter, Justin B, DO   Stopped at 10/28/23 2222   [START ON 10/29/2023] insulin  aspart (novoLOG ) injection 0-15 Units  0-15 Units Subcutaneous TID WC Howerter, Justin B, DO       insulin  aspart (novoLOG ) injection 0-5 Units  0-5 Units Subcutaneous QHS Howerter, Justin B, DO       ketorolac  (TORADOL ) 15 MG/ML injection 15 mg  15 mg Intravenous Q6H PRN Howerter, Justin B, DO       melatonin tablet 3 mg  3 mg Oral QHS PRN Howerter, Justin B, DO       ondansetron  (ZOFRAN ) injection 4 mg  4 mg Intravenous Q6H PRN Howerter, Justin B, DO       oxyCODONE  (Oxy IR/ROXICODONE ) immediate release tablet 5 mg  5 mg Oral Q4H PRN Howerter, Justin B, DO       prochlorperazine  (COMPAZINE ) injection 10 mg  10 mg Intravenous Q6H PRN Howerter, Justin B, DO         PHYSICAL EXAMINATION: ECOG PERFORMANCE STATUS: 2 - Symptomatic, <50% confined to bed Vitals:   10/28/23 1351  BP: 129/77  Pulse: 99  Resp: 18  Temp: 97.8 F (36.6 C)  SpO2: 100%   Filed Weights   10/28/23 1351  Weight: 229 lb (103.9 kg)      Physical Exam Constitutional:      General: She is in acute distress.     Comments: Patient sits in the wheelchair  HENT:     Head: Normocephalic and atraumatic.  Eyes:     General: No scleral icterus. Cardiovascular:     Rate and Rhythm: Normal rate and regular rhythm.     Heart sounds: Normal heart sounds.  Pulmonary:     Effort: Pulmonary effort is normal. No respiratory distress.     Breath sounds: No wheezing.  Abdominal:     General: Bowel sounds are normal. There is no distension.     Palpations: Abdomen is soft.  Musculoskeletal:        General: No deformity. Normal range of motion.     Cervical back: Normal range of motion and neck supple.  Skin:    General: Skin is warm and dry.     Coloration: Skin is not pale.     Findings: No erythema or rash.  Neurological:     Mental Status: She is  alert and oriented to person, place, and time. Mental status is at baseline.  Psychiatric:        Mood and Affect: Mood normal.     LABORATORY DATA:  I have reviewed the data as listed    Latest Ref Rng & Units 10/28/2023    3:57 PM 10/28/2023    1:19 PM 10/25/2023   12:28 AM  CBC  WBC 4.0 - 10.5 K/uL 13.6  11.3  10.9   Hemoglobin 12.0 - 15.0 g/dL 8.4  7.5  8.1   Hematocrit 36.0 - 46.0 % 26.1  22.0  24.4   Platelets 150 - 400 K/uL 121  98  116       Latest Ref Rng & Units 10/28/2023  5:55 PM 10/28/2023    1:19 PM 10/25/2023   12:28 AM  CMP  Glucose 70 - 99 mg/dL 764  488    BUN 6 - 20 mg/dL 16  18    Creatinine 9.55 - 1.00 mg/dL 8.90  9.00    Sodium 864 - 145 mmol/L 137  130    Potassium 3.5 - 5.1 mmol/L 4.4  4.8  4.8   Chloride 98 - 111 mmol/L 110  106    CO2 22 - 32 mmol/L 20  18    Calcium  8.9 - 10.3 mg/dL 8.6  8.1    Total Protein 6.5 - 8.1 g/dL 6.0  5.8    Total Bilirubin 0.0 - 1.2 mg/dL 0.8  0.8    Alkaline Phos 38 - 126 U/L 107  108    AST 15 - 41 U/L 17  16    ALT 0 - 44 U/L 18  18       Iron /TIBC/Ferritin/ %Sat    Component Value Date/Time   IRON  90 07/23/2023 1548   IRON  64 01/02/2019 0955   TIBC 353 07/23/2023 1548   TIBC 382 01/02/2019 0955   FERRITIN 206 07/23/2023 1548   FERRITIN 21 01/02/2019 0955   IRONPCTSAT 26 07/23/2023 1548   IRONPCTSAT 17 01/02/2019 0955      RADIOGRAPHIC STUDIES: I have personally reviewed the radiological images as listed and agreed with the findings in the report. CT Head Wo Contrast Result Date: 10/28/2023 CLINICAL DATA:  Headache. EXAM: CT HEAD WITHOUT CONTRAST TECHNIQUE: Contiguous axial images were obtained from the base of the skull through the vertex without intravenous contrast. RADIATION DOSE REDUCTION: This exam was performed according to the departmental dose-optimization program which includes automated exposure control, adjustment of the mA and/or kV according to patient size and/or use of iterative reconstruction  technique. COMPARISON:  May 18, 2023 FINDINGS: Brain: No evidence of acute infarction, hemorrhage, hydrocephalus, extra-axial collection or mass lesion/mass effect. Vascular: No hyperdense vessel or unexpected calcification. Skull: A chronic fracture deformity is seen involving the right zygomatic arch. Sinuses/Orbits: There is marked severity bilateral ethmoid sinus mucosal thickening. Moderate severity sphenoid sinus mucosal thickening is also seen. Small to moderate size bilateral maxillary sinus air-fluid levels are noted. Other: None. IMPRESSION: 1. No acute intracranial abnormality. 2. Marked severity bilateral ethmoid sinus and moderate severity sphenoid sinus disease. 3. Small to moderate size bilateral maxillary sinus air-fluid levels, consistent with acute sinusitis. Electronically Signed   By: Suzen Dials M.D.   On: 10/28/2023 20:08   DG Chest 2 View Result Date: 10/28/2023 CLINICAL DATA:  Short of breath, cough EXAM: CHEST - 2 VIEW COMPARISON:  None Available. FINDINGS: Normal mediastinum and cardiac silhouette. Normal pulmonary vasculature. No evidence of effusion, infiltrate, or pneumothorax. No acute bony abnormality. IMPRESSION: No acute cardiopulmonary process. Electronically Signed   By: Jackquline Boxer M.D.   On: 10/28/2023 17:25   DG Chest 2 View Result Date: 10/24/2023 CLINICAL DATA:  Chest pain. EXAM: CHEST - 2 VIEW COMPARISON:  05/14/2023. FINDINGS: Bilateral lung fields are clear. Bilateral costophrenic angles are clear. Normal cardio-mediastinal silhouette. No acute osseous abnormalities. The soft tissues are within normal limits. IMPRESSION: No active cardiopulmonary disease. Electronically Signed   By: Ree Molt M.D.   On: 10/24/2023 17:59

## 2023-10-29 ENCOUNTER — Inpatient Hospital Stay

## 2023-10-29 DIAGNOSIS — A419 Sepsis, unspecified organism: Secondary | ICD-10-CM | POA: Diagnosis present

## 2023-10-29 DIAGNOSIS — G44209 Tension-type headache, unspecified, not intractable: Secondary | ICD-10-CM | POA: Diagnosis present

## 2023-10-29 DIAGNOSIS — Z833 Family history of diabetes mellitus: Secondary | ICD-10-CM | POA: Diagnosis not present

## 2023-10-29 DIAGNOSIS — E1142 Type 2 diabetes mellitus with diabetic polyneuropathy: Secondary | ICD-10-CM | POA: Diagnosis present

## 2023-10-29 DIAGNOSIS — J4540 Moderate persistent asthma, uncomplicated: Secondary | ICD-10-CM | POA: Diagnosis present

## 2023-10-29 DIAGNOSIS — Z823 Family history of stroke: Secondary | ICD-10-CM | POA: Diagnosis not present

## 2023-10-29 DIAGNOSIS — D72829 Elevated white blood cell count, unspecified: Secondary | ICD-10-CM | POA: Diagnosis present

## 2023-10-29 DIAGNOSIS — J320 Chronic maxillary sinusitis: Secondary | ICD-10-CM | POA: Diagnosis present

## 2023-10-29 DIAGNOSIS — E1165 Type 2 diabetes mellitus with hyperglycemia: Secondary | ICD-10-CM | POA: Diagnosis present

## 2023-10-29 DIAGNOSIS — N3001 Acute cystitis with hematuria: Secondary | ICD-10-CM | POA: Diagnosis not present

## 2023-10-29 DIAGNOSIS — Z8249 Family history of ischemic heart disease and other diseases of the circulatory system: Secondary | ICD-10-CM | POA: Diagnosis not present

## 2023-10-29 DIAGNOSIS — J0100 Acute maxillary sinusitis, unspecified: Secondary | ICD-10-CM | POA: Diagnosis present

## 2023-10-29 DIAGNOSIS — Z888 Allergy status to other drugs, medicaments and biological substances status: Secondary | ICD-10-CM | POA: Diagnosis not present

## 2023-10-29 DIAGNOSIS — K219 Gastro-esophageal reflux disease without esophagitis: Secondary | ICD-10-CM | POA: Diagnosis present

## 2023-10-29 DIAGNOSIS — D638 Anemia in other chronic diseases classified elsewhere: Secondary | ICD-10-CM | POA: Diagnosis present

## 2023-10-29 DIAGNOSIS — E871 Hypo-osmolality and hyponatremia: Secondary | ICD-10-CM | POA: Diagnosis present

## 2023-10-29 DIAGNOSIS — Z1152 Encounter for screening for COVID-19: Secondary | ICD-10-CM | POA: Diagnosis not present

## 2023-10-29 DIAGNOSIS — Z7951 Long term (current) use of inhaled steroids: Secondary | ICD-10-CM | POA: Diagnosis not present

## 2023-10-29 DIAGNOSIS — D696 Thrombocytopenia, unspecified: Secondary | ICD-10-CM | POA: Diagnosis present

## 2023-10-29 DIAGNOSIS — R519 Headache, unspecified: Secondary | ICD-10-CM | POA: Diagnosis not present

## 2023-10-29 DIAGNOSIS — E8721 Acute metabolic acidosis: Secondary | ICD-10-CM | POA: Diagnosis present

## 2023-10-29 DIAGNOSIS — E66811 Obesity, class 1: Secondary | ICD-10-CM | POA: Diagnosis present

## 2023-10-29 DIAGNOSIS — J019 Acute sinusitis, unspecified: Secondary | ICD-10-CM | POA: Diagnosis not present

## 2023-10-29 DIAGNOSIS — Z794 Long term (current) use of insulin: Secondary | ICD-10-CM | POA: Diagnosis not present

## 2023-10-29 DIAGNOSIS — Z87891 Personal history of nicotine dependence: Secondary | ICD-10-CM | POA: Diagnosis not present

## 2023-10-29 DIAGNOSIS — E861 Hypovolemia: Secondary | ICD-10-CM | POA: Diagnosis present

## 2023-10-29 DIAGNOSIS — N390 Urinary tract infection, site not specified: Secondary | ICD-10-CM | POA: Diagnosis present

## 2023-10-29 DIAGNOSIS — D649 Anemia, unspecified: Secondary | ICD-10-CM | POA: Diagnosis not present

## 2023-10-29 DIAGNOSIS — I10 Essential (primary) hypertension: Secondary | ICD-10-CM | POA: Diagnosis present

## 2023-10-29 DIAGNOSIS — N3000 Acute cystitis without hematuria: Secondary | ICD-10-CM | POA: Diagnosis not present

## 2023-10-29 LAB — CBC WITH DIFFERENTIAL/PLATELET
Abs Immature Granulocytes: 0.05 K/uL (ref 0.00–0.07)
Basophils Absolute: 0.1 K/uL (ref 0.0–0.1)
Basophils Relative: 0 %
Eosinophils Absolute: 0 K/uL (ref 0.0–0.5)
Eosinophils Relative: 0 %
HCT: 21.2 % — ABNORMAL LOW (ref 36.0–46.0)
Hemoglobin: 7.1 g/dL — ABNORMAL LOW (ref 12.0–15.0)
Immature Granulocytes: 0 %
Lymphocytes Relative: 14 %
Lymphs Abs: 1.6 K/uL (ref 0.7–4.0)
MCH: 29.6 pg (ref 26.0–34.0)
MCHC: 33.5 g/dL (ref 30.0–36.0)
MCV: 88.3 fL (ref 80.0–100.0)
Monocytes Absolute: 0.7 K/uL (ref 0.1–1.0)
Monocytes Relative: 6 %
Neutro Abs: 9 K/uL — ABNORMAL HIGH (ref 1.7–7.7)
Neutrophils Relative %: 80 %
Platelets: 105 K/uL — ABNORMAL LOW (ref 150–400)
RBC: 2.4 MIL/uL — ABNORMAL LOW (ref 3.87–5.11)
RDW: 18.6 % — ABNORMAL HIGH (ref 11.5–15.5)
WBC: 11.3 K/uL — ABNORMAL HIGH (ref 4.0–10.5)
nRBC: 0 % (ref 0.0–0.2)

## 2023-10-29 LAB — COMPREHENSIVE METABOLIC PANEL WITH GFR
ALT: 17 U/L (ref 0–44)
AST: 17 U/L (ref 15–41)
Albumin: 2.3 g/dL — ABNORMAL LOW (ref 3.5–5.0)
Alkaline Phosphatase: 100 U/L (ref 38–126)
Anion gap: 7 (ref 5–15)
BUN: 16 mg/dL (ref 6–20)
CO2: 19 mmol/L — ABNORMAL LOW (ref 22–32)
Calcium: 8 mg/dL — ABNORMAL LOW (ref 8.9–10.3)
Chloride: 110 mmol/L (ref 98–111)
Creatinine, Ser: 1.14 mg/dL — ABNORMAL HIGH (ref 0.44–1.00)
GFR, Estimated: >60 mL/min (ref >60)
Glucose, Bld: 322 mg/dL — ABNORMAL HIGH (ref 70–99)
Potassium: 4.6 mmol/L (ref 3.5–5.1)
Sodium: 136 mmol/L (ref 135–145)
Total Bilirubin: 1 mg/dL (ref 0.0–1.2)
Total Protein: 5.2 g/dL — ABNORMAL LOW (ref 6.5–8.1)

## 2023-10-29 LAB — CBG MONITORING, ED
Glucose-Capillary: 307 mg/dL — ABNORMAL HIGH (ref 70–99)
Glucose-Capillary: 253 mg/dL — ABNORMAL HIGH (ref 70–99)

## 2023-10-29 LAB — GLUCOSE, CAPILLARY
Glucose-Capillary: 232 mg/dL — ABNORMAL HIGH (ref 70–99)
Glucose-Capillary: 249 mg/dL — ABNORMAL HIGH (ref 70–99)
Glucose-Capillary: 274 mg/dL — ABNORMAL HIGH (ref 70–99)

## 2023-10-29 LAB — MAGNESIUM: Magnesium: 2 mg/dL (ref 1.7–2.4)

## 2023-10-29 MED ORDER — INSULIN GLARGINE-YFGN 100 UNIT/ML ~~LOC~~ SOLN
30.0000 [IU] | Freq: Every day | SUBCUTANEOUS | Status: DC
  Administered 2023-10-29 (×2): 30 [IU] via SUBCUTANEOUS
  Filled 2023-10-29 (×2): qty 0.3

## 2023-10-29 MED ORDER — INSULIN GLARGINE-YFGN 100 UNIT/ML ~~LOC~~ SOLN
15.0000 [IU] | Freq: Every day | SUBCUTANEOUS | Status: DC
  Filled 2023-10-29: qty 0.15

## 2023-10-29 MED ORDER — SODIUM CHLORIDE 0.9 % IV SOLN
3.0000 g | Freq: Four times a day (QID) | INTRAVENOUS | Status: DC
  Administered 2023-10-29 – 2023-10-31 (×13): 3 g via INTRAVENOUS
  Filled 2023-10-29 (×7): qty 8

## 2023-10-29 MED ORDER — FERROUS SULFATE 325 (65 FE) MG PO TABS
325.0000 mg | ORAL_TABLET | Freq: Every day | ORAL | Status: DC
  Administered 2023-10-29 – 2023-10-31 (×5): 325 mg via ORAL
  Filled 2023-10-29 (×3): qty 1

## 2023-10-29 MED ORDER — ALBUTEROL SULFATE (2.5 MG/3ML) 0.083% IN NEBU: 2.5000 mg | INHALATION_SOLUTION | RESPIRATORY_TRACT | Status: DC | PRN

## 2023-10-29 MED ORDER — AMITRIPTYLINE HCL 10 MG PO TABS
10.0000 mg | ORAL_TABLET | Freq: Every day | ORAL | Status: DC
  Administered 2023-10-29 – 2023-10-30 (×4): 10 mg via ORAL
  Filled 2023-10-29 (×2): qty 1

## 2023-10-29 MED ORDER — TIZANIDINE HCL 4 MG PO TABS
4.0000 mg | ORAL_TABLET | Freq: Two times a day (BID) | ORAL | Status: DC | PRN
  Administered 2023-10-29 (×2): 4 mg via ORAL
  Filled 2023-10-29: qty 1

## 2023-10-29 MED ORDER — GABAPENTIN 400 MG PO CAPS
800.0000 mg | ORAL_CAPSULE | Freq: Three times a day (TID) | ORAL | Status: DC
  Administered 2023-10-29 – 2023-10-31 (×13): 800 mg via ORAL
  Filled 2023-10-29: qty 8
  Filled 2023-10-29 (×6): qty 2

## 2023-10-29 MED ORDER — INSULIN ASPART 100 UNIT/ML IJ SOLN
7.0000 [IU] | Freq: Three times a day (TID) | INTRAMUSCULAR | Status: DC
  Administered 2023-10-29 – 2023-10-31 (×9): 7 [IU] via SUBCUTANEOUS

## 2023-10-29 MED ORDER — PANTOPRAZOLE SODIUM 40 MG PO TBEC
40.0000 mg | DELAYED_RELEASE_TABLET | Freq: Every day | ORAL | Status: DC
  Administered 2023-10-29 – 2023-10-31 (×5): 40 mg via ORAL
  Filled 2023-10-29 (×3): qty 1

## 2023-10-29 MED ORDER — LOSARTAN POTASSIUM 25 MG PO TABS
25.0000 mg | ORAL_TABLET | Freq: Every day | ORAL | Status: DC
  Administered 2023-10-29 – 2023-10-31 (×5): 25 mg via ORAL
  Filled 2023-10-29 (×3): qty 1

## 2023-10-29 MED ORDER — FLUTICASONE FUROATE-VILANTEROL 100-25 MCG/ACT IN AEPB
1.0000 | INHALATION_SPRAY | Freq: Every day | RESPIRATORY_TRACT | Status: DC
  Administered 2023-10-29 – 2023-10-31 (×5): 1 via RESPIRATORY_TRACT
  Filled 2023-10-29: qty 28

## 2023-10-29 NOTE — ED Notes (Signed)
Pt ate 100% of her meal.

## 2023-10-29 NOTE — ED Notes (Signed)
 Patient removed self from telemetry monitoring to go to restroom. Patient back in bed and telemetry leads back in place. Patient complaining of headache and muscle cramps 6/10. Will admin PRN medications per [facility]

## 2023-10-29 NOTE — Plan of Care (Signed)
  Problem: Education: Goal: Ability to describe self-care measures that may prevent or decrease complications (Diabetes Survival Skills Education) will improve Outcome: Progressing   Problem: Coping: Goal: Ability to adjust to condition or change in health will improve Outcome: Progressing

## 2023-10-29 NOTE — Progress Notes (Addendum)
 PROGRESS NOTE    Melinda Stone  FMW:[id number] DOB: Aug 09, 1980 DOA: 10/28/2023 PCP: [name], NP   Brief Narrative:  43 year old female with history of diabetes mellitus type 2, anemia of chronic disease with baseline hemoglobin of 7-9 who follows up with Dr. Nathalie, moderate persistent asthma, chronic lower extremity edema, chronic back pain presented with subjective fevers with generalized myalgias along with headache.  On presentation, she was tachycardic, tachypneic with hyperglycemia, leukocytosis and UA suggestive of UTI.  COVID/influenza/RSV PCR negative.  Chest x-ray showed no acute cardiopulmonary process.  CT of the head without contrast showed no acute intracranial abnormality but showed acute bilateral maxillary sinusitis  Assessment & Plan:   Sepsis: Present on admission UTI: Present on admission Possible acute bilateral maxillary sinusitis - Presented with subjective fever, myalgias, suprapubic tenderness along with tachycardia, tachypnea, leukocytosis with evidence of UTI and bilateral maxillary sinusitis -follow cultures.  Patient received IV Rocephin  on admission.  Will switch to Unasyn  which will cover for sinusitis as well. -Received IV fluids on presentation.  IV fluids have been subsequently discontinued.  Encourage oral intake.  Leukocytosis -Improving  Normocytic anemia/anemia of chronic disease -Questionable cause. Hemoglobin of 7-9.  Follows up with Dr. Nathalie as an outpatient and was treated with steroids as an outpatient which have been tapered off.  Outpatient follow-up with oncology - Hemoglobin 7.1 this morning.  Possibly also dilutional.  IV fluids have been stopped.  Repeat a.m. labs.  Transfuse if hemoglobin is less than 7 - Continue ferrous sulfate   Diabetes mellitus type 2 with hyperglycemia - Most recent A1c 7.5 on 10/01/2023.  Was put on long-acting insulin  15 units daily on admission (normally takes 45 units at home).  Blood  sugars are elevated.  I have increased long-acting insulin  to 30 units daily.  Add NovoLog  with meals.  Continue CBGs with SSI. - Carb modified diet  Essential hypertension - Monitor blood pressure.  Continue losartan   Moderate persistent asthma - Showing no signs of exacerbation.  Continue current inhaled regimen.  GERD -continue PPI  Hyponatremia -Resolved  Acute metabolic acidosis -Improving.  Monitor  Thrombocytopenia -Questionable cause.  Monitor intermittently.  No signs of bleeding.  Obesity class I - Outpatient follow-up  Headache History of migraines -Improving.  Continue as needed Tylenol , Toradol , Compazine .   DVT prophylaxis: SCDs.  Avoiding Lovenox  or heparin  because of anemia Code Status: Full Family Communication: None at bedside Disposition Plan: Status is: Observation The patient will require care spanning > 2 midnights and should be moved to inpatient because: Of severity of illness.  Need for IV antibiotics.    Consultants: None  Procedures: None  Antimicrobials:  Anti-infectives (From admission, onward)    Start     Dose/Rate Route Frequency Ordered Stop   10/28/23 2130  cefTRIAXone  (ROCEPHIN ) 1 g in sodium chloride  0.9 % 100 mL IVPB        1 g 200 mL/hr over 30 Minutes Intravenous Every 24 hours 10/28/23 2118     10/28/23 2000  piperacillin -tazobactam (ZOSYN ) IVPB 3.375 g        3.375 g 100 mL/hr over 30 Minutes Intravenous  Once 10/28/23 1957 10/28/23 2114        Subjective: Patient seen and examined at bedside.  Does not feel much better.  Still having headache but improving.  Complains of intermittent cough.  No fever, worsening abdominal pain, vomiting reported.  Objective: Vitals:   10/29/23 0400 10/29/23 0700 10/29/23 0948 10/29/23 1018  BP: (!) 159/92  ROLLEN)  149/70   Pulse: (!) 103 100 94   Resp: (!) 22 18 16    Temp: 99.4 F (37.4 C)   99 F (37.2 C)  TempSrc: Oral   Oral  SpO2: 97% 99% 99%   Weight:         Intake/Output Summary (Last 24 hours) at 10/29/2023 1030 Last data filed at 10/29/2023 0653 Gross per 24 hour  Intake 2282.67 ml  Output --  Net 2282.67 ml   Filed Weights   10/28/23 1626  Weight: 103.9 kg    Examination:  General exam: Appears calm and comfortable. Respiratory system: Bilateral decreased breath sounds at bases, intermittently tachypneic.  Mild intermittent wheezing Cardiovascular system: S1 & S2 heard, Rate controlled currently Gastrointestinal system: Abdomen is obese, nondistended, soft and nontender. Normal bowel sounds heard. Extremities: No cyanosis, clubbing, edema  Central nervous system: Alert and oriented. No focal neurological deficits. Moving extremities Skin: No rashes, lesions or ulcers Psychiatry: Affect is mostly flat.  Not agitated.     Data Reviewed: I have personally reviewed following labs and imaging studies  CBC: Recent Labs  Lab 10/25/23 0028 10/28/23 1319 10/28/23 1557 10/29/23 0459  WBC 10.9* 11.3* 13.6* 11.3*  NEUTROABS  --  9.3* 11.2* 9.0*  HGB 8.1* 7.5* 8.4* 7.1*  HCT 24.4* 22.0* 26.1* 21.2*  MCV 88.7 87.3 92.2 88.3  PLT 116* 98* 121* 105*   Basic Metabolic Panel: Recent Labs  Lab 10/24/23 2044 10/25/23 0028 10/28/23 1319 10/28/23 1755 10/29/23 0459  NA 133*  --  130* 137 136  K 5.4* 4.8 4.8 4.4 4.6  CL 107  --  106 110 110  CO2 17*  --  18* 20* 19*  GLUCOSE 504*  --  511* 235* 322*  BUN 32*  --  18 16 16   CREATININE 1.29*  --  0.99 1.09* 1.14*  CALCIUM  8.6*  --  8.1* 8.6* 8.0*  MG  --   --   --  1.7 2.0   GFR: Estimated Creatinine Clearance: 83.4 mL/min (A) (by C-G formula based on SCr of 1.14 mg/dL (H)). Liver Function Tests: Recent Labs  Lab 10/28/23 1319 10/28/23 1755 10/29/23 0459  AST 16 17 17   ALT 18 18 17   ALKPHOS 108 107 100  BILITOT 0.8 0.8 1.0  PROT 5.8* 6.0* 5.2*  ALBUMIN 2.6* 2.7* 2.3*   Recent Labs  Lab 10/28/23 1755  LIPASE 30   No results for input(s): AMMONIA in the last  168 hours. Coagulation Profile: No results for input(s): INR, PROTIME in the last 168 hours. Cardiac Enzymes: No results for input(s): CKTOTAL, CKMB, CKMBINDEX, TROPONINI in the last 168 hours. BNP (last 3 results) Recent Labs    07/16/23 1040  PROBNP 1,695*   HbA1C: No results for input(s): HGBA1C in the last 72 hours. CBG: Recent Labs  Lab 10/24/23 2323 10/25/23 0123 10/25/23 0355 10/28/23 2127 10/29/23 0748  GLUCAP >600* 506* 399* 157* 307*   Lipid Profile: No results for input(s): CHOL, HDL, LDLCALC, TRIG, CHOLHDL, LDLDIRECT in the last 72 hours. Thyroid  Function Tests: No results for input(s): TSH, T4TOTAL, FREET4, T3FREE, THYROIDAB in the last 72 hours. Anemia Panel: Recent Labs    10/28/23 1319  RETICCTPCT 12.4*   Sepsis Labs: Recent Labs  Lab 10/28/23 1755 10/28/23 2016  PROCALCITON <0.10  --   LATICACIDVEN  --  1.3    Recent Results (from the past 240 hours)  Resp panel by RT-PCR (RSV, Flu A&B, Covid) Anterior Nasal Swab  Status: None   Collection Time: 10/28/23  4:20 PM   Specimen: Anterior Nasal Swab  Result Value Ref Range Status   SARS Coronavirus 2 by RT PCR NEGATIVE NEGATIVE Final   Influenza A by PCR NEGATIVE NEGATIVE Final   Influenza B by PCR NEGATIVE NEGATIVE Final    Comment: (NOTE) The Xpert Xpress SARS-CoV-2/FLU/RSV plus assay is intended as an aid in the diagnosis of influenza from Nasopharyngeal swab specimens and should not be used as a sole basis for treatment. Nasal washings and aspirates are unacceptable for Xpert Xpress SARS-CoV-2/FLU/RSV testing.  Fact Sheet for Patients: BloggerCourse.com  Fact Sheet for Healthcare Providers: SeriousBroker.it  This test is not yet approved or cleared by the [place]  FDA and has been authorized for detection and/or diagnosis of SARS-CoV-2 by FDA under an Emergency Use Authorization (EUA). This EUA  will remain in effect (meaning this test can be used) for the duration of the COVID-19 declaration under Section 564(b)(1) of the Act, 21 U.S.C. section 360bbb-3(b)(1), unless the authorization is terminated or revoked.     Resp Syncytial Virus by PCR NEGATIVE NEGATIVE Final    Comment: (NOTE) Fact Sheet for Patients: BloggerCourse.com  Fact Sheet for Healthcare Providers: SeriousBroker.it  This test is not yet approved or cleared by the [place]  FDA and has been authorized for detection and/or diagnosis of SARS-CoV-2 by FDA under an Emergency Use Authorization (EUA). This EUA will remain in effect (meaning this test can be used) for the duration of the COVID-19 declaration under Section 564(b)(1) of the Act, 21 U.S.C. section 360bbb-3(b)(1), unless the authorization is terminated or revoked.  Performed at [facility], [address], [place], [place] [address]   Blood culture (routine x 2)     Status: None (Preliminary result)   Collection Time: 10/28/23  8:16 PM   Specimen: BLOOD RIGHT ARM  Result Value Ref Range Status   Specimen Description BLOOD RIGHT ARM  Final   Special Requests   Final    BOTTLES DRAWN AEROBIC AND ANAEROBIC Blood Culture adequate volume   Culture   Final    NO GROWTH < 12 HOURS Performed at [facility], [address], [place], [place] [address]    Report Status PENDING  Incomplete  Blood culture (routine x 2)     Status: None (Preliminary result)   Collection Time: 10/28/23  8:16 PM   Specimen: BLOOD LEFT ARM  Result Value Ref Range Status   Specimen Description BLOOD LEFT ARM  Final   Special Requests   Final    BOTTLES DRAWN AEROBIC AND ANAEROBIC Blood Culture adequate volume   Culture   Final    NO GROWTH < 12 HOURS Performed at [facility], [address], [place], [place] [address]    Report Status PENDING  Incomplete         Radiology Studies: CT Head Wo  Contrast Result Date: 10/28/2023 CLINICAL DATA:  Headache. EXAM: CT HEAD WITHOUT CONTRAST TECHNIQUE: Contiguous axial images were obtained from the base of the skull through the vertex without intravenous contrast. RADIATION DOSE REDUCTION: This exam was performed according to the departmental dose-optimization program which includes automated exposure control, adjustment of the mA and/or kV according to patient size and/or use of iterative reconstruction technique. COMPARISON:  May 18, 2023 FINDINGS: Brain: No evidence of acute infarction, hemorrhage, hydrocephalus, extra-axial collection or mass lesion/mass effect. Vascular: No hyperdense vessel or unexpected calcification. Skull: A chronic fracture deformity is seen involving  the right zygomatic arch. Sinuses/Orbits: There is marked severity bilateral ethmoid sinus mucosal thickening. Moderate severity sphenoid sinus mucosal thickening is also seen. Small to moderate size bilateral maxillary sinus air-fluid levels are noted. Other: None. IMPRESSION: 1. No acute intracranial abnormality. 2. Marked severity bilateral ethmoid sinus and moderate severity sphenoid sinus disease. 3. Small to moderate size bilateral maxillary sinus air-fluid levels, consistent with acute sinusitis. Electronically Signed   By: Suzen Dials M.D.   On: 10/28/2023 20:08   DG Chest 2 View Result Date: 10/28/2023 CLINICAL DATA:  Short of breath, cough EXAM: CHEST - 2 VIEW COMPARISON:  None Available. FINDINGS: Normal mediastinum and cardiac silhouette. Normal pulmonary vasculature. No evidence of effusion, infiltrate, or pneumothorax. No acute bony abnormality. IMPRESSION: No acute cardiopulmonary process. Electronically Signed   By: Jackquline Boxer M.D.   On: 10/28/2023 17:25        Scheduled Meds:  amitriptyline   10 mg Oral QHS   ferrous sulfate   325 mg Oral Q breakfast   fluticasone  furoate-vilanterol  1 puff Inhalation Daily   gabapentin   800 mg Oral TID   insulin   aspart  0-15 Units Subcutaneous TID WC   insulin  aspart  0-5 Units Subcutaneous QHS   insulin  aspart  7 Units Subcutaneous TID WC   insulin  glargine-yfgn  30 Units Subcutaneous Daily   losartan   25 mg Oral Daily   pantoprazole   40 mg Oral Daily   Continuous Infusions:  cefTRIAXone  (ROCEPHIN )  IV Stopped (10/28/23 2222)          Sophie Mao, MD [facility] 10/29/2023, 10:30 AM

## 2023-10-29 NOTE — ED Notes (Signed)
 Checked patient cbg it was 253 patient is resting with call bell in reach

## 2023-10-29 NOTE — Progress Notes (Signed)
 Pharmacy Antibiotic Note  Melinda Stone is a 43 y.o. female admitted on 10/28/2023 with sinusitis and UTI.  Patient has history of ESBL E. Coli UTI April 2025. Pharmacy has been consulted for Unasyn  dosing.  Plan: Unasyn  3G IV q6 hours Monitor cultures and clinical progression  Weight: 103.9 kg (229 lb)  Temp (24hrs), Avg:98.8 F (37.1 C), Min:97.8 F (36.6 C), Max:99.7 F (37.6 C)  Recent Labs  Lab 10/24/23 2044 10/25/23 0028 10/28/23 1319 10/28/23 1557 10/28/23 1755 10/28/23 2016 10/29/23 0459  WBC  --  10.9* 11.3* 13.6*  --   --  11.3*  CREATININE 1.29*  --  0.99  --  1.09*  --  1.14*  LATICACIDVEN  --   --   --   --   --  1.3  --     Estimated Creatinine Clearance: 83.4 mL/min (A) (by C-G formula based on SCr of 1.14 mg/dL (H)).    Allergies  Allergen Reactions   Trulicity  [Dulaglutide ] Diarrhea    Thank you for allowing pharmacy to be a part of this patient's care.  Koren LITTIE Rajah Tagliaferro 10/29/2023 11:12 AM

## 2023-10-29 NOTE — ED Notes (Signed)
 Patient removed self from telemetry monitoring. Patient in restroom at this time. Patient notified to alert nursing staff when back in bed so telemetry cables can be reconnected

## 2023-10-30 DIAGNOSIS — J019 Acute sinusitis, unspecified: Secondary | ICD-10-CM

## 2023-10-30 DIAGNOSIS — D649 Anemia, unspecified: Secondary | ICD-10-CM | POA: Diagnosis not present

## 2023-10-30 DIAGNOSIS — N3001 Acute cystitis with hematuria: Secondary | ICD-10-CM

## 2023-10-30 DIAGNOSIS — N3000 Acute cystitis without hematuria: Secondary | ICD-10-CM | POA: Diagnosis not present

## 2023-10-30 LAB — COMPREHENSIVE METABOLIC PANEL WITH GFR
ALT: 16 U/L (ref 0–44)
AST: 16 U/L (ref 15–41)
Albumin: 2.2 g/dL — ABNORMAL LOW (ref 3.5–5.0)
Alkaline Phosphatase: 96 U/L (ref 38–126)
Anion gap: 7 (ref 5–15)
BUN: 15 mg/dL (ref 6–20)
CO2: 20 mmol/L — ABNORMAL LOW (ref 22–32)
Calcium: 8.5 mg/dL — ABNORMAL LOW (ref 8.9–10.3)
Chloride: 109 mmol/L (ref 98–111)
Creatinine, Ser: 1.26 mg/dL — ABNORMAL HIGH (ref 0.44–1.00)
GFR, Estimated: 55 mL/min — ABNORMAL LOW (ref >60)
Glucose, Bld: 343 mg/dL — ABNORMAL HIGH (ref 70–99)
Potassium: 5 mmol/L (ref 3.5–5.1)
Sodium: 136 mmol/L (ref 135–145)
Total Bilirubin: 0.7 mg/dL (ref 0.0–1.2)
Total Protein: 5.3 g/dL — ABNORMAL LOW (ref 6.5–8.1)

## 2023-10-30 LAB — CBC WITH DIFFERENTIAL/PLATELET
Abs Immature Granulocytes: 0.05 K/uL (ref 0.00–0.07)
Basophils Absolute: 0 K/uL (ref 0.0–0.1)
Basophils Relative: 1 %
Eosinophils Absolute: 0 K/uL (ref 0.0–0.5)
Eosinophils Relative: 0 %
HCT: 19.6 % — ABNORMAL LOW (ref 36.0–46.0)
Hemoglobin: 6.6 g/dL — CL (ref 12.0–15.0)
Immature Granulocytes: 1 %
Lymphocytes Relative: 17 %
Lymphs Abs: 1.4 K/uL (ref 0.7–4.0)
MCH: 29.2 pg (ref 26.0–34.0)
MCHC: 33.7 g/dL (ref 30.0–36.0)
MCV: 86.7 fL (ref 80.0–100.0)
Monocytes Absolute: 0.5 K/uL (ref 0.1–1.0)
Monocytes Relative: 5 %
Neutro Abs: 6.4 K/uL (ref 1.7–7.7)
Neutrophils Relative %: 76 %
Platelets: 109 K/uL — ABNORMAL LOW (ref 150–400)
RBC: 2.26 MIL/uL — ABNORMAL LOW (ref 3.87–5.11)
RDW: 18.7 % — ABNORMAL HIGH (ref 11.5–15.5)
WBC: 8.4 K/uL (ref 4.0–10.5)
nRBC: 0 % (ref 0.0–0.2)

## 2023-10-30 LAB — HEMOGLOBIN AND HEMATOCRIT, BLOOD
HCT: 23.6 % — ABNORMAL LOW (ref 36.0–46.0)
Hemoglobin: 8.2 g/dL — ABNORMAL LOW (ref 12.0–15.0)

## 2023-10-30 LAB — GLUCOSE, CAPILLARY
Glucose-Capillary: 308 mg/dL — ABNORMAL HIGH (ref 70–99)
Glucose-Capillary: 292 mg/dL — ABNORMAL HIGH (ref 70–99)
Glucose-Capillary: 196 mg/dL — ABNORMAL HIGH (ref 70–99)
Glucose-Capillary: 200 mg/dL — ABNORMAL HIGH (ref 70–99)

## 2023-10-30 LAB — PREPARE RBC (CROSSMATCH)

## 2023-10-30 LAB — MAGNESIUM: Magnesium: 1.9 mg/dL (ref 1.7–2.4)

## 2023-10-30 MED ORDER — LEVOFLOXACIN IN D5W 750 MG/150ML IV SOLN
750.0000 mg | INTRAVENOUS | Status: DC
  Administered 2023-10-30 – 2023-10-31 (×3): 750 mg via INTRAVENOUS
  Filled 2023-10-30 (×2): qty 150

## 2023-10-30 MED ORDER — HEPARIN SOD (PORK) LOCK FLUSH 100 UNIT/ML IV SOLN: 500.0000 [IU] | Freq: Every day | INTRAVENOUS | Status: DC | PRN

## 2023-10-30 MED ORDER — SODIUM CHLORIDE 0.9% FLUSH: 3.0000 mL | INTRAVENOUS | Status: DC | PRN

## 2023-10-30 MED ORDER — HEPARIN SOD (PORK) LOCK FLUSH 100 UNIT/ML IV SOLN: 250.0000 [IU] | INTRAVENOUS | Status: DC | PRN

## 2023-10-30 MED ORDER — SODIUM CHLORIDE 0.9% IV SOLUTION: Freq: Once | INTRAVENOUS | Status: AC

## 2023-10-30 MED ORDER — SODIUM CHLORIDE 0.9% FLUSH: 10.0000 mL | INTRAVENOUS | Status: DC | PRN

## 2023-10-30 MED ORDER — SODIUM CHLORIDE 0.9% IV SOLUTION: 250.0000 mL | INTRAVENOUS | Status: DC

## 2023-10-30 MED ORDER — INSULIN GLARGINE-YFGN 100 UNIT/ML ~~LOC~~ SOLN
25.0000 [IU] | Freq: Two times a day (BID) | SUBCUTANEOUS | Status: DC
  Administered 2023-10-30 – 2023-10-31 (×5): 25 [IU] via SUBCUTANEOUS
  Filled 2023-10-30 (×4): qty 0.25

## 2023-10-30 MED ORDER — SALINE SPRAY 0.65 % NA SOLN
1.0000 | NASAL | Status: DC | PRN
  Administered 2023-10-30 (×2): 1 via NASAL
  Filled 2023-10-30: qty 44

## 2023-10-30 MED ORDER — FLUTICASONE PROPIONATE 50 MCG/ACT NA SUSP
2.0000 | Freq: Every day | NASAL | Status: DC
  Administered 2023-10-30 – 2023-10-31 (×3): 2 via NASAL
  Filled 2023-10-30: qty 16

## 2023-10-30 NOTE — TOC CM/SW Note (Signed)
 Transition of Care [facility]) - Inpatient Brief Assessment   Patient Details  Name: Melinda Stone MRN: [id number] Date of Birth: 07-09-1980  Transition of Care [facility]) CM/SW Contact:    Tom-Johnson, Harvest Muskrat, RN Phone Number: 10/30/2023, 10:41 AM   Clinical Narrative:  Patient presented to the ED with Headache, Chest pain, Shortness of Breath , elevated Blood Sugar, Bilateral Legs Swelling. Patient found to have Sepsis 2/2 UTI/ suspected Acute Cystitis.  CT Head showed no acute intracranial abnormality, shows severe Bilateral Sinusitis. Started on Intranasal Steroids. Patient continues on IV abx.  Patient has hx of Chronic Anemia with baseline Hgb 7-9. Hgb on admit was 8.2, today hgb at 6.6. 1U PRBC ordered. Patient is followed by a Hematologist. Patient also has hx of T2DM, Persistent Asthma, Chronic LE Edema, Chronic Back Pain.   From home with her three children. Has four supportive siblings. Not employed, on disability. Independent with care and drive self. Has a cane, rollator and shower seat at home.  PCP is [name], NP and uses [facility] on [address].   No TOC needs or recommendations noted at this time.  Patient not Medically ready for discharge.  CM will continue to follow as patient progresses with care towards discharge.              Transition of Care Asessment: Insurance and Status: Insurance coverage has been reviewed Patient has primary care physician: Yes Home environment has been reviewed: Yes Prior level of function:: Modified Independent Prior/Current Home Services: No current home services Social Drivers of Health Review: SDOH reviewed no interventions necessary Readmission risk has been reviewed: Yes Transition of care needs: no transition of care needs at this time

## 2023-10-30 NOTE — Progress Notes (Signed)
 [facility] PROGRESS NOTE    Progress Note  Melinda Stone  FMW:[id number] DOB: 19-Mar-1981 DOA: 10/28/2023 PCP: [name], NP     Brief Narrative:   Melinda Stone is an 43 y.o. female past medical Struve diabetes mellitus type 2 with anemia of chronic disease with baseline hemoglobin 7-9 followed by hematologist, chronic lower extremity edema presenting with subjective fevers and generalized weakness was found to to be septic due to UTI   Assessment/Plan:   Sepsis due to UTI (urinary tract infection) Transition to IV Levaquin . Urine cultures and blood cultures were sent. Tmax 99.7, leukocytosis resolved. Continue IV Unasyn .  Anemia of chronic disease/normocytic anemia: Of unclear cause will follow-up with hematology as an outpatient. She was on steroids and outpatient which has been tapered off. Hemoglobin this morning 6.6 we will go ahead and transfuse 2 units packed red blood cells. recheck in the morning.  Severe sinusitis: CT scan of the head showed no acute intracranial abnormality but shows severe bilateral sinusitis. She has failed oral Augmentin  as an outpatient, will transition to IV Levaquin . Start her on intranasal steroids.  Diabetes mellitus type 2 with hyperglycemia: Last A1c of 7.5 blood glucose significantly high, increase long-acting insulin  continue sliding scale.  Essential hypertension: Continue losartan , blood pressure is well-controlled.  Moderate persistent asthma: And no exacerbation.  GERD: Continue PPI.  Hypovolemic hyponatremia: Resolved with IV fluids.  Acute metabolic acidosis: Resolved.  Thrombocytopenia: Unclear cause no signs of bleeding follow-up with oncology as an outpatient. Platelets are improving.  Morbid obesity: Follow-up with outpatient.  History of migraines: Continue Tylenol , Toradol  and Compazine    DVT prophylaxis: lovenxo Family Communication:none Status is: Inpatient Remains  inpatient appropriate because: Acute sinusitis    Code Status:     Code Status Orders  (From admission, onward)           Start     Ordered   10/28/23 2117  Full code  Continuous       Question:  By:  Answer:  Consent: discussion documented in EHR   10/28/23 2117           Code Status History     Date Active Date Inactive Code Status Order ID Comments User Context   09/05/2023 2044 09/06/2023 2001 Full Code [id number]  Waddell Rake, MD ED   07/31/2023 1538 08/02/2023 2200 Full Code [id number]  Moody Alto, MD ED   05/23/2023 1551 05/29/2023 1612 Full Code [id number]  Rosendo Nena PARAS, PA-C Inpatient   05/23/2023 1551 05/23/2023 1551 Full Code [id number]  Rosendo Nena PARAS, PA-C Inpatient   05/11/2023 1227 05/23/2023 1545 Full Code [id number]  Tobie Mario GAILS, MD ED   03/25/2023 2017 03/27/2023 2200 Full Code [id number]  Sonjia Held, MD ED   12/24/2022 1328 12/27/2022 1457 Full Code [id number]  Von Bellis, MD ED   05/13/2022 0845 05/15/2022 2132 Full Code [id number]  Celinda Alm Lot, MD ED   05/05/2022 0910 05/07/2022 1639 Full Code [id number]  Royal Sill, MD ED   06/08/2021 2329 06/11/2021 1916 Full Code [id number]  Franky Redia SAILOR, MD ED   11/14/2020 1625 11/15/2020 1805 Full Code [id number]  Quin Lynwood HERO, PA-C Inpatient   05/30/2020 1701 05/31/2020 1924 Full Code [id number]  Quin Lynwood HERO, PA-C Inpatient         IV Access:   Peripheral IV   Procedures and diagnostic studies:   CT Head Wo Contrast Result Date: 10/28/2023 CLINICAL DATA:  Headache. EXAM: CT HEAD WITHOUT CONTRAST TECHNIQUE: Contiguous axial images  were obtained from the base of the skull through the vertex without intravenous contrast. RADIATION DOSE REDUCTION: This exam was performed according to the departmental dose-optimization program which includes automated exposure control, adjustment of the mA and/or kV according to patient size and/or use of iterative reconstruction technique. COMPARISON:  May 18, 2023 FINDINGS: Brain: No evidence of acute infarction, hemorrhage, hydrocephalus, extra-axial collection or mass lesion/mass effect. Vascular: No hyperdense vessel or unexpected calcification. Skull: A chronic fracture deformity is seen involving the right zygomatic arch. Sinuses/Orbits: There is marked severity bilateral ethmoid sinus mucosal thickening. Moderate severity sphenoid sinus mucosal thickening is also seen. Small to moderate size bilateral maxillary sinus air-fluid levels are noted. Other: None. IMPRESSION: 1. No acute intracranial abnormality. 2. Marked severity bilateral ethmoid sinus and moderate severity sphenoid sinus disease. 3. Small to moderate size bilateral maxillary sinus air-fluid levels, consistent with acute sinusitis. Electronically Signed   By: Suzen Dials M.D.   On: 10/28/2023 20:08   DG Chest 2 View Result Date: 10/28/2023 CLINICAL DATA:  Short of breath, cough EXAM: CHEST - 2 VIEW COMPARISON:  None Available. FINDINGS: Normal mediastinum and cardiac silhouette. Normal pulmonary vasculature. No evidence of effusion, infiltrate, or pneumothorax. No acute bony abnormality. IMPRESSION: No acute cardiopulmonary process. Electronically Signed   By: Jackquline Boxer M.D.   On: 10/28/2023 17:25     Medical Consultants:   None.   Subjective:    Melinda Stone she relates her headaches are better continues to have a swollen face.  Objective:    Vitals:   10/29/23 1624 10/29/23 2118 10/30/23 0502 10/30/23 0802  BP: 138/70 133/70 (!) 162/87 (!) 123/59  Pulse: 91 90 89 89  Resp: 19 18 18 19   Temp: 98.1 F (36.7 C) 97.9 F (36.6 C)  98.5 F (36.9 C)  TempSrc:      SpO2: 100% 98% 98% 98%  Weight:       SpO2: 98 %   Intake/Output Summary (Last 24 hours) at 10/30/2023 0807 Last data filed at 10/30/2023 0200 Gross per 24 hour  Intake 1461.08 ml  Output 0 ml  Net 1461.08 ml   Filed Weights   10/28/23 1626  Weight: 103.9 kg    Exam: General  exam: In no acute distress. Respiratory system: Good air movement and clear to auscultation. Cardiovascular system: S1 & S2 heard, RRR. No JVD. Gastrointestinal system: Abdomen is nondistended, soft and nontender.  Extremities: No pedal edema. Skin: No rashes, lesions or ulcers Psychiatry: Judgement and insight appear normal. Mood & affect appropriate.    Data Reviewed:    Labs: Basic Metabolic Panel: Recent Labs  Lab 10/24/23 2044 10/25/23 0028 10/28/23 1319 10/28/23 1755 10/29/23 0459 10/30/23 0455  NA 133*  --  130* 137 136 136  K 5.4*   < > 4.8 4.4 4.6 5.0  CL 107  --  106 110 110 109  CO2 17*  --  18* 20* 19* 20*  GLUCOSE 504*  --  511* 235* 322* 343*  BUN 32*  --  18 16 16 15   CREATININE 1.29*  --  0.99 1.09* 1.14* 1.26*  CALCIUM  8.6*  --  8.1* 8.6* 8.0* 8.5*  MG  --   --   --  1.7 2.0 1.9   < > = values in this interval not displayed.   GFR Estimated Creatinine Clearance: 75.4 mL/min (A) (by C-G formula based on SCr of 1.26 mg/dL (H)). Liver Function Tests: Recent Labs  Lab 10/28/23 1319 10/28/23 1755  10/29/23 0459 10/30/23 0455  AST 16 17 17 16   ALT 18 18 17 16   ALKPHOS 108 107 100 96  BILITOT 0.8 0.8 1.0 0.7  PROT 5.8* 6.0* 5.2* 5.3*  ALBUMIN 2.6* 2.7* 2.3* 2.2*   Recent Labs  Lab 10/28/23 1755  LIPASE 30   No results for input(s): AMMONIA in the last 168 hours. Coagulation profile No results for input(s): INR, PROTIME in the last 168 hours. COVID-19 Labs  No results for input(s): DDIMER, FERRITIN, LDH, CRP in the last 72 hours.  Lab Results  Component Value Date   SARSCOV2NAA NEGATIVE 10/28/2023   SARSCOV2NAA NEGATIVE 05/11/2023   SARSCOV2NAA NEGATIVE 05/11/2023   SARSCOV2NAA NEGATIVE 03/25/2023    CBC: Recent Labs  Lab 10/25/23 0028 10/28/23 1319 10/28/23 1557 10/29/23 0459 10/30/23 0455  WBC 10.9* 11.3* 13.6* 11.3* 8.4  NEUTROABS  --  9.3* 11.2* 9.0* 6.4  HGB 8.1* 7.5* 8.4* 7.1* 6.6*  HCT 24.4* 22.0* 26.1* 21.2*  19.6*  MCV 88.7 87.3 92.2 88.3 86.7  PLT 116* 98* 121* 105* 109*   Cardiac Enzymes: No results for input(s): CKTOTAL, CKMB, CKMBINDEX, TROPONINI in the last 168 hours. BNP (last 3 results) Recent Labs    07/16/23 1040  PROBNP 1,695*   CBG: Recent Labs  Lab 10/29/23 1213 10/29/23 1327 10/29/23 1625 10/29/23 2117 10/30/23 0801  GLUCAP 253* 232* 249* 274* 308*   D-Dimer: No results for input(s): DDIMER in the last 72 hours. Hgb A1c: No results for input(s): HGBA1C in the last 72 hours. Lipid Profile: No results for input(s): CHOL, HDL, LDLCALC, TRIG, CHOLHDL, LDLDIRECT in the last 72 hours. Thyroid  function studies: No results for input(s): TSH, T4TOTAL, T3FREE, THYROIDAB in the last 72 hours.  Invalid input(s): FREET3 Anemia work up: Recent Labs    10/28/23 1319  RETICCTPCT 12.4*   Sepsis Labs: Recent Labs  Lab 10/28/23 1319 10/28/23 1557 10/28/23 1755 10/28/23 2016 10/29/23 0459 10/30/23 0455  PROCALCITON  --   --  <0.10  --   --   --   WBC 11.3* 13.6*  --   --  11.3* 8.4  LATICACIDVEN  --   --   --  1.3  --   --    Microbiology Recent Results (from the past 240 hours)  Resp panel by RT-PCR (RSV, Flu A&B, Covid) Anterior Nasal Swab     Status: None   Collection Time: 10/28/23  4:20 PM   Specimen: Anterior Nasal Swab  Result Value Ref Range Status   SARS Coronavirus 2 by RT PCR NEGATIVE NEGATIVE Final   Influenza A by PCR NEGATIVE NEGATIVE Final   Influenza B by PCR NEGATIVE NEGATIVE Final    Comment: (NOTE) The Xpert Xpress SARS-CoV-2/FLU/RSV plus assay is intended as an aid in the diagnosis of influenza from Nasopharyngeal swab specimens and should not be used as a sole basis for treatment. Nasal washings and aspirates are unacceptable for Xpert Xpress SARS-CoV-2/FLU/RSV testing.  Fact Sheet for Patients: BloggerCourse.com  Fact Sheet for Healthcare  Providers: SeriousBroker.it  This test is not yet approved or cleared by the [place]  FDA and has been authorized for detection and/or diagnosis of SARS-CoV-2 by FDA under an Emergency Use Authorization (EUA). This EUA will remain in effect (meaning this test can be used) for the duration of the COVID-19 declaration under Section 564(b)(1) of the Act, 21 U.S.C. section 360bbb-3(b)(1), unless the authorization is terminated or revoked.     Resp Syncytial Virus by PCR NEGATIVE NEGATIVE Final  Comment: (NOTE) Fact Sheet for Patients: BloggerCourse.com  Fact Sheet for Healthcare Providers: SeriousBroker.it  This test is not yet approved or cleared by the [place]  FDA and has been authorized for detection and/or diagnosis of SARS-CoV-2 by FDA under an Emergency Use Authorization (EUA). This EUA will remain in effect (meaning this test can be used) for the duration of the COVID-19 declaration under Section 564(b)(1) of the Act, 21 U.S.C. section 360bbb-3(b)(1), unless the authorization is terminated or revoked.  Performed at [facility], [address] [place], [place] [address]   Blood culture (routine x 2)     Status: None (Preliminary result)   Collection Time: 10/28/23  8:16 PM   Specimen: BLOOD RIGHT ARM  Result Value Ref Range Status   Specimen Description BLOOD RIGHT ARM  Final   Special Requests   Final    BOTTLES DRAWN AEROBIC AND ANAEROBIC Blood Culture adequate volume   Culture   Final    NO GROWTH 1 DAY Performed at [facility], [address], [place], [place] [address]    Report Status PENDING  Incomplete  Blood culture (routine x 2)     Status: None (Preliminary result)   Collection Time: 10/28/23  8:16 PM   Specimen: BLOOD LEFT ARM  Result Value Ref Range Status   Specimen Description BLOOD LEFT ARM  Final   Special Requests   Final    BOTTLES DRAWN AEROBIC  AND ANAEROBIC Blood Culture adequate volume   Culture   Final    NO GROWTH 1 DAY Performed at [facility], [address] [place], [place] [address]    Report Status PENDING  Incomplete     Medications:    sodium chloride    Intravenous Once   amitriptyline   10 mg Oral QHS   ferrous sulfate   325 mg Oral Q breakfast   fluticasone  furoate-vilanterol  1 puff Inhalation Daily   gabapentin   800 mg Oral TID   insulin  aspart  0-15 Units Subcutaneous TID WC   insulin  aspart  0-5 Units Subcutaneous QHS   insulin  aspart  7 Units Subcutaneous TID WC   insulin  glargine-yfgn  30 Units Subcutaneous Daily   losartan   25 mg Oral Daily   pantoprazole   40 mg Oral Daily   Continuous Infusions:  sodium chloride      ampicillin -sulbactam (UNASYN ) IV 3 g (10/30/23 0459)      LOS: 1 day   Erle Odell Castor  [facility]  10/30/2023, 8:07 AM

## 2023-10-31 ENCOUNTER — Other Ambulatory Visit (HOSPITAL_COMMUNITY): Payer: Self-pay

## 2023-10-31 DIAGNOSIS — J019 Acute sinusitis, unspecified: Secondary | ICD-10-CM | POA: Diagnosis not present

## 2023-10-31 DIAGNOSIS — R519 Headache, unspecified: Secondary | ICD-10-CM | POA: Diagnosis not present

## 2023-10-31 DIAGNOSIS — N3000 Acute cystitis without hematuria: Secondary | ICD-10-CM | POA: Diagnosis not present

## 2023-10-31 DIAGNOSIS — N3001 Acute cystitis with hematuria: Secondary | ICD-10-CM | POA: Diagnosis not present

## 2023-10-31 LAB — TYPE AND SCREEN
ABO/RH(D): A POS
Antibody Screen: NEGATIVE
Unit division: 0
Unit division: 0

## 2023-10-31 LAB — BPAM RBC
Blood Product Expiration Date: 202509062359
Blood Product Expiration Date: 202509062359
ISSUE DATE / TIME: 202508131022
ISSUE DATE / TIME: 202508131336
Unit Type and Rh: 6200
Unit Type and Rh: 6200

## 2023-10-31 LAB — URINE CULTURE: Culture: 30000 — AB

## 2023-10-31 LAB — GLUCOSE, CAPILLARY: Glucose-Capillary: 137 mg/dL — ABNORMAL HIGH (ref 70–99)

## 2023-10-31 MED ORDER — LEVOFLOXACIN 750 MG PO TABS
750.0000 mg | ORAL_TABLET | Freq: Every day | ORAL | 0 refills | Status: AC
  Filled 2023-10-31: qty 10, 10d supply, fill #0

## 2023-10-31 MED ORDER — PREDNISONE 20 MG PO TABS
40.0000 mg | ORAL_TABLET | Freq: Every day | ORAL | Status: DC
  Administered 2023-10-31: 40 mg via ORAL
  Filled 2023-10-31: qty 2

## 2023-10-31 MED ORDER — FLUTICASONE PROPIONATE 50 MCG/ACT NA SUSP
2.0000 | Freq: Every day | NASAL | 0 refills | Status: DC
  Filled 2023-10-31: qty 16, 30d supply, fill #0

## 2023-10-31 MED ORDER — PREDNISONE 10 MG PO TABS
ORAL_TABLET | ORAL | 0 refills | Status: DC
Start: 2023-10-31 — End: 2023-11-25
  Filled 2023-10-31: qty 6, 3d supply, fill #0

## 2023-10-31 NOTE — TOC Transition Note (Signed)
 Transition of Care [facility]) - Discharge Note   Patient Details  Name: Melinda Stone MRN: [id number] Date of Birth: 29-Jun-1980  [facility]) CM/SW Contact:  Tom-Johnson, Harvest Muskrat, RN Phone Number: 10/31/2023, 9:37 AM   Clinical Narrative:     Patient is scheduled for discharge today.  Readmission Risk Assessment done. Outpatient f/u, hospital f/u and discharge instructions on AVS. Prescriptions sent to [facility] pharmacy and patient will receive meds prior discharge. No TOC needs or recommendations noted. Daughter, Reather to transport at discharge.  No further TOC needs noted.       Final next level of care: Home/Self Care Barriers to Discharge: Barriers Resolved   Patient Goals and CMS Choice Patient states their goals for this hospitalization and ongoing recovery are:: To return home CMS Medicare.gov Compare Post Acute Care list provided to:: Patient Choice offered to / list presented to : NA      Discharge Placement                Patient to be transferred to facility by: Daughter Name of family member notified: [facility]    Discharge Plan and Services Additional resources added to the After Visit Summary for                  DME Arranged: N/A DME Agency: NA       HH Arranged: NA HH Agency: NA        Social Drivers of Health (SDOH) Interventions SDOH Screenings   Food Insecurity: No Food Insecurity (10/29/2023)  Housing: Low Risk  (10/29/2023)  Transportation Needs: No Transportation Needs (10/29/2023)  Utilities: Not At Risk (10/29/2023)  Depression (PHQ2-9): Low Risk  (09/30/2023)  Social Connections: Moderately Isolated (07/31/2023)  Tobacco Use: Medium Risk (10/28/2023)     Readmission Risk Interventions    10/30/2023   10:41 AM 03/27/2023   11:25 AM  Readmission Risk Prevention Plan  Transportation Screening Complete Complete  Medication Review (RN Care Manager) Referral to Pharmacy Referral to Pharmacy  PCP or  Specialist appointment within 3-5 days of discharge Complete Complete  HRI or Home Care Consult Complete Not Complete  HRI or Home Care Consult Pt Refusal Comments  Not applicable for [facility].  SW Recovery Care/Counseling Consult Complete Complete  Palliative Care Screening Not Applicable Not Applicable  Skilled Nursing Facility Not Applicable Not Applicable

## 2023-10-31 NOTE — Discharge Summary (Signed)
 Physician Discharge Summary  Melinda Stone FMW:[id number] DOB: 1981-01-01 DOA: 10/28/2023  PCP: [name], NP  Admit date: 10/28/2023 Discharge date: 10/31/2023  Admitted From: Home Disposition:  Home  Recommendations for Outpatient Follow-up:  Follow up with PCP in 1-2 weeks   Home Health:No Equipment/Devices:None  Discharge Condition:Stable CODE STATUS:Full Diet recommendation: Heart Healthy  Brief/Interim Summary: 43 y.o. female past medical Struve diabetes mellitus type 2 with anemia of chronic disease with baseline hemoglobin 7-9 followed by hematologist, chronic lower extremity edema presenting with subjective fevers and generalized weakness was found to to be septic due to UTI   Discharge Diagnoses:  Principal Problem:   UTI (urinary tract infection) Active Problems:   Anemia of chronic disease   Essential hypertension   Sepsis (HCC)   DM2 (diabetes mellitus, type 2) (HCC)   Leukocytosis   Tension headache   Moderate persistent asthma  Sepsis probably due to severe sinusitis: She is started on IV Unasyn  and saline nasal spray. Urine culture grew less than 30,000 colonies of E. coli. Blood cultures were negative she defervesced. She was transition to IV Levaquin  and Flonase  nasal spray and her facial swelling significantly decreased and she relates she feels better. She will continue Flonase  and Levaquin  orally.  Anemia of chronic disease/normocytic anemia: Follow-up with hematology as an outpatient. Her hemoglobin dropped to 6.6 she was transfused units of packed red blood cells hemoglobin came back at 8.  Diabetes mellitus type 2 with hyperglycemia: With an A1c of 7.5, she will continue her insulin  regimen at home.  Essential hypertension: No changes made to her medication.  Moderate persistent asthma: No signs of exacerbation.  GERD: Continue PPI.  Hypovolemic hyponatremia: Resolved with IV fluids.  Acute metabolic acidosis: Now  resolved.  Thrombocytopenia: Follow-up with oncology as an outpatient.   Discharge Instructions  Discharge Instructions     Diet - low sodium heart healthy   Complete by: As directed    Increase activity slowly   Complete by: As directed       Allergies as of 10/31/2023       Reactions   Trulicity  [dulaglutide ] Diarrhea        Medication List     STOP taking these medications    amoxicillin -clavulanate 500-125 MG tablet Commonly known as: AUGMENTIN    cephALEXin  500 MG capsule Commonly known as: KEFLEX        TAKE these medications    acetaminophen  500 MG tablet Commonly known as: TYLENOL  Take 1,000 mg by mouth every 6 (six) hours as needed for mild pain (pain score 1-3) or moderate pain (pain score 4-6).   amitriptyline  10 MG tablet Commonly known as: ELAVIL  Take 10 mg by mouth at bedtime.   Dexcom G7 Sensor Misc 1 Device by Does not apply route as directed. Change sensor every 10 days What changed:  how to take this when to take this   Dulera  100-5 MCG/ACT Aero Generic drug: mometasone -formoterol  Inhale 2 puffs into the lungs 2 (two) times daily at 10 AM and 5 PM.   Emgality  120 MG/ML Soaj Generic drug: Galcanezumab -gnlm Inject 120 mg into the skin every 30 (thirty) days.   FeroSul 325 (65 Fe) MG tablet Generic drug: ferrous sulfate  Take 1 tablet (325 mg total) by mouth daily with breakfast.   fluticasone  50 MCG/ACT nasal spray Commonly known as: FLONASE  Place 2 sprays into both nostrils daily.   furosemide  20 MG tablet Commonly known as: LASIX  Take 1 tablet (20 mg total) by mouth in the morning.  gabapentin  800 MG tablet Commonly known as: NEURONTIN  Take 800 mg by mouth 3 (three) times daily.   ibuprofen  600 MG tablet Commonly known as: ADVIL  Take 1 tablet (600 mg total) by mouth every 6 (six) hours as needed.   insulin  lispro 100 UNIT/ML KwikPen Commonly known as: HumaLOG  KwikPen Inject 12-22 Units into the skin 3 (three) times  daily. Max daily   Insulin  Pen Needle 32G X 4 MM Misc 1 Device by Does not apply route in the morning, at noon, in the evening, and at bedtime.   ketorolac  0.5 % ophthalmic solution Commonly known as: ACULAR  Place 1 drop into the right eye 4 (four) times daily.   Lantus  SoloStar 100 UNIT/ML Solostar Pen Generic drug: insulin  glargine Inject 45 Units into the skin daily before breakfast.   levofloxacin  750 MG tablet Commonly known as: Levaquin  Take 1 tablet (750 mg total) by mouth daily for 10 days.   losartan  25 MG tablet Commonly known as: COZAAR  Take 1 tablet (25 mg total) by mouth daily.   metoprolol  tartrate 25 MG tablet Commonly known as: LOPRESSOR  Take 1 tablet (25 mg total) by mouth 2 (two) times daily.   multivitamin with minerals Tabs tablet Take 1 tablet by mouth daily.   Nurtec 75 MG Tbdp Generic drug: Rimegepant Sulfate Take 1 tablet (75 mg total) by mouth as needed. Take 1 tablet at onset of headache, max is 1 tablet in 24 hours.   ofloxacin 0.3 % ophthalmic solution Commonly known as: OCUFLOX Place 1 drop into the right eye 4 (four) times daily.   Omnipod 5 G7 Intro (Gen 5) Kit 1 Device by Does not apply route every other day.   Omnipod 5 G7 Pods (Gen 5) Misc 1 Device by Does not apply route every other day.   oxyCODONE -acetaminophen  5-325 MG tablet Commonly known as: PERCOCET/ROXICET Take 1 tablet by mouth every 6 (six) hours as needed.   oxyCODONE -acetaminophen  7.5-325 MG tablet Commonly known as: PERCOCET Take 1 tablet by mouth 4 (four) times daily as needed for moderate pain (pain score 4-6).   pantoprazole  40 MG tablet Commonly known as: PROTONIX  Take 1 tablet (40 mg total) by mouth daily.   prednisoLONE acetate 1 % ophthalmic suspension Commonly known as: PRED FORTE Place 1 drop into the right eye 4 (four) times daily.   predniSONE  10 MG tablet Commonly known as: DELTASONE  Takes 3 tablets for 1 days, then 2 tabs for 1 days, then 1 tab  for 1 days, and then stop.   tiZANidine  4 MG tablet Commonly known as: ZANAFLEX  Take 4 mg by mouth every 12 (twelve) hours as needed for muscle spasms.   Ventolin  HFA 108 (90 Base) MCG/ACT inhaler Generic drug: albuterol  Inhale 2 puffs into the lungs every 6 (six) hours as needed for wheezing or shortness of breath.   Vitamin B-12 2500 MCG Subl Place 1 tablet (2,500 mcg total) under the tongue daily.   vitamin C  250 MG tablet Commonly known as: ASCORBIC ACID  Take 2 tablets (500 mg total) by mouth daily.        Allergies  Allergen Reactions   Trulicity  [Dulaglutide ] Diarrhea    Consultations: None   Procedures/Studies: CT Head Wo Contrast Result Date: 10/28/2023 CLINICAL DATA:  Headache. EXAM: CT HEAD WITHOUT CONTRAST TECHNIQUE: Contiguous axial images were obtained from the base of the skull through the vertex without intravenous contrast. RADIATION DOSE REDUCTION: This exam was performed according to the departmental dose-optimization program which includes automated exposure control, adjustment  of the mA and/or kV according to patient size and/or use of iterative reconstruction technique. COMPARISON:  May 18, 2023 FINDINGS: Brain: No evidence of acute infarction, hemorrhage, hydrocephalus, extra-axial collection or mass lesion/mass effect. Vascular: No hyperdense vessel or unexpected calcification. Skull: A chronic fracture deformity is seen involving the right zygomatic arch. Sinuses/Orbits: There is marked severity bilateral ethmoid sinus mucosal thickening. Moderate severity sphenoid sinus mucosal thickening is also seen. Small to moderate size bilateral maxillary sinus air-fluid levels are noted. Other: None. IMPRESSION: 1. No acute intracranial abnormality. 2. Marked severity bilateral ethmoid sinus and moderate severity sphenoid sinus disease. 3. Small to moderate size bilateral maxillary sinus air-fluid levels, consistent with acute sinusitis. Electronically Signed   By:  Suzen Dials M.D.   On: 10/28/2023 20:08   DG Chest 2 View Result Date: 10/28/2023 CLINICAL DATA:  Short of breath, cough EXAM: CHEST - 2 VIEW COMPARISON:  None Available. FINDINGS: Normal mediastinum and cardiac silhouette. Normal pulmonary vasculature. No evidence of effusion, infiltrate, or pneumothorax. No acute bony abnormality. IMPRESSION: No acute cardiopulmonary process. Electronically Signed   By: Jackquline Boxer M.D.   On: 10/28/2023 17:25   DG Chest 2 View Result Date: 10/24/2023 CLINICAL DATA:  Chest pain. EXAM: CHEST - 2 VIEW COMPARISON:  05/14/2023. FINDINGS: Bilateral lung fields are clear. Bilateral costophrenic angles are clear. Normal cardio-mediastinal silhouette. No acute osseous abnormalities. The soft tissues are within normal limits. IMPRESSION: No active cardiopulmonary disease. Electronically Signed   By: Ree Molt M.D.   On: 10/24/2023 17:59     Subjective: No complaints  Discharge Exam: Vitals:   10/30/23 2024 10/31/23 0521  BP: (!) 171/84 (!) 161/89  Pulse: 84 88  Resp: 18 16  Temp: 98 F (36.7 C) 98.4 F (36.9 C)  SpO2: 98% 100%   Vitals:   10/30/23 1416 10/30/23 1623 10/30/23 2024 10/31/23 0521  BP: (!) 154/81 (!) 159/88 (!) 171/84 (!) 161/89  Pulse: 92 87 84 88  Resp:  18 18 16   Temp: 98.3 F (36.8 C) 97.7 F (36.5 C) 98 F (36.7 C) 98.4 F (36.9 C)  TempSrc: Oral Oral  Oral  SpO2: 98% 100% 98% 100%  Weight:    108.6 kg    General: Pt is alert, awake, not in acute distress Cardiovascular: RRR, S1/S2 +, no rubs, no gallops Respiratory: CTA bilaterally, no wheezing, no rhonchi Abdominal: Soft, NT, ND, bowel sounds + Extremities: no edema, no cyanosis    The results of significant diagnostics from this hospitalization (including imaging, microbiology, ancillary and laboratory) are listed below for reference.     Microbiology: Recent Results (from the past 240 hours)  Resp panel by RT-PCR (RSV, Flu A&B, Covid) Anterior Nasal  Swab     Status: None   Collection Time: 10/28/23  4:20 PM   Specimen: Anterior Nasal Swab  Result Value Ref Range Status   SARS Coronavirus 2 by RT PCR NEGATIVE NEGATIVE Final   Influenza A by PCR NEGATIVE NEGATIVE Final   Influenza B by PCR NEGATIVE NEGATIVE Final    Comment: (NOTE) The Xpert Xpress SARS-CoV-2/FLU/RSV plus assay is intended as an aid in the diagnosis of influenza from Nasopharyngeal swab specimens and should not be used as a sole basis for treatment. Nasal washings and aspirates are unacceptable for Xpert Xpress SARS-CoV-2/FLU/RSV testing.  Fact Sheet for Patients: BloggerCourse.com  Fact Sheet for Healthcare Providers: SeriousBroker.it  This test is not yet approved or cleared by the [place]  FDA and has been authorized for  detection and/or diagnosis of SARS-CoV-2 by FDA under an Emergency Use Authorization (EUA). This EUA will remain in effect (meaning this test can be used) for the duration of the COVID-19 declaration under Section 564(b)(1) of the Act, 21 U.S.C. section 360bbb-3(b)(1), unless the authorization is terminated or revoked.     Resp Syncytial Virus by PCR NEGATIVE NEGATIVE Final    Comment: (NOTE) Fact Sheet for Patients: BloggerCourse.com  Fact Sheet for Healthcare Providers: SeriousBroker.it  This test is not yet approved or cleared by the [place]  FDA and has been authorized for detection and/or diagnosis of SARS-CoV-2 by FDA under an Emergency Use Authorization (EUA). This EUA will remain in effect (meaning this test can be used) for the duration of the COVID-19 declaration under Section 564(b)(1) of the Act, 21 U.S.C. section 360bbb-3(b)(1), unless the authorization is terminated or revoked.  Performed at [facility], [address], [place], [place] [address]   Urine Culture     Status: Abnormal   Collection Time:  10/28/23  7:24 PM   Specimen: Urine, Clean Catch  Result Value Ref Range Status   Specimen Description URINE, CLEAN CATCH  Final   Special Requests   Final    NONE Performed at [facility], [address], [place], [place] [address]    Culture (A)  Final    30,000 COLONIES/mL ESCHERICHIA COLI Confirmed Extended Spectrum Beta-Lactamase Producer (ESBL).  In bloodstream infections from ESBL organisms, carbapenems are preferred over piperacillin /tazobactam. They are shown to have a lower risk of mortality.    Report Status 10/31/2023 FINAL  Final   Organism ID, Bacteria ESCHERICHIA COLI (A)  Final      Susceptibility   Escherichia coli - MIC*    AMPICILLIN  >=32 RESISTANT Resistant     CEFAZOLIN  Value in next row Resistant      >=32 RESISTANTThis is a modified FDA-approved test that has been validated and its performance characteristics determined by the reporting laboratory.  This laboratory is certified under the Clinical Laboratory Improvement Amendments CLIA as qualified to perform high complexity clinical laboratory testing.    CEFEPIME  Value in next row Resistant      >=32 RESISTANTThis is a modified FDA-approved test that has been validated and its performance characteristics determined by the reporting laboratory.  This laboratory is certified under the Clinical Laboratory Improvement Amendments CLIA as qualified to perform high complexity clinical laboratory testing.    ERTAPENEM Value in next row Sensitive      >=32 RESISTANTThis is a modified FDA-approved test that has been validated and its performance characteristics determined by the reporting laboratory.  This laboratory is certified under the Clinical Laboratory Improvement Amendments CLIA as qualified to perform high complexity clinical laboratory testing.    CEFTRIAXONE  Value in next row Resistant      >=32 RESISTANTThis is a modified FDA-approved test that has been validated and its performance characteristics determined  by the reporting laboratory.  This laboratory is certified under the Clinical Laboratory Improvement Amendments CLIA as qualified to perform high complexity clinical laboratory testing.    CIPROFLOXACIN Value in next row Resistant      >=32 RESISTANTThis is a modified FDA-approved test that has been validated and its performance characteristics determined by the reporting laboratory.  This laboratory is certified under the Clinical Laboratory Improvement Amendments CLIA as qualified to perform high complexity clinical laboratory testing.    GENTAMICIN Value in next row Sensitive      >=32 RESISTANTThis is a modified  FDA-approved test that has been validated and its performance characteristics determined by the reporting laboratory.  This laboratory is certified under the Clinical Laboratory Improvement Amendments CLIA as qualified to perform high complexity clinical laboratory testing.    NITROFURANTOIN  Value in next row Sensitive      >=32 RESISTANTThis is a modified FDA-approved test that has been validated and its performance characteristics determined by the reporting laboratory.  This laboratory is certified under the Clinical Laboratory Improvement Amendments CLIA as qualified to perform high complexity clinical laboratory testing.    TRIMETH /SULFA  Value in next row Resistant      >=32 RESISTANTThis is a modified FDA-approved test that has been validated and its performance characteristics determined by the reporting laboratory.  This laboratory is certified under the Clinical Laboratory Improvement Amendments CLIA as qualified to perform high complexity clinical laboratory testing.    AMPICILLIN /SULBACTAM Value in next row Sensitive      >=32 RESISTANTThis is a modified FDA-approved test that has been validated and its performance characteristics determined by the reporting laboratory.  This laboratory is certified under the Clinical Laboratory Improvement Amendments CLIA as qualified to perform  high complexity clinical laboratory testing.    PIP/TAZO Value in next row Sensitive ug/mL     <=4 SENSITIVEThis is a modified FDA-approved test that has been validated and its performance characteristics determined by the reporting laboratory.  This laboratory is certified under the Clinical Laboratory Improvement Amendments CLIA as qualified to perform high complexity clinical laboratory testing.    MEROPENEM Value in next row Sensitive      <=4 SENSITIVEThis is a modified FDA-approved test that has been validated and its performance characteristics determined by the reporting laboratory.  This laboratory is certified under the Clinical Laboratory Improvement Amendments CLIA as qualified to perform high complexity clinical laboratory testing.    * 30,000 COLONIES/mL ESCHERICHIA COLI  Blood culture (routine x 2)     Status: None (Preliminary result)   Collection Time: 10/28/23  8:16 PM   Specimen: BLOOD RIGHT ARM  Result Value Ref Range Status   Specimen Description BLOOD RIGHT ARM  Final   Special Requests   Final    BOTTLES DRAWN AEROBIC AND ANAEROBIC Blood Culture adequate volume   Culture   Final    NO GROWTH 2 DAYS Performed at [facility], [address], [place], [place] [address]    Report Status PENDING  Incomplete  Blood culture (routine x 2)     Status: None (Preliminary result)   Collection Time: 10/28/23  8:16 PM   Specimen: BLOOD LEFT ARM  Result Value Ref Range Status   Specimen Description BLOOD LEFT ARM  Final   Special Requests   Final    BOTTLES DRAWN AEROBIC AND ANAEROBIC Blood Culture adequate volume   Culture   Final    NO GROWTH 2 DAYS Performed at [facility], [address] [place], [place] [address]    Report Status PENDING  Incomplete     Labs: BNP (last 3 results) Recent Labs    04/21/23 1147 05/11/23 2028 10/28/23 1557  BNP 238.5* 1,082.1* 204.2*   Basic Metabolic Panel: Recent Labs  Lab 10/24/23 2044 10/25/23 0028  10/28/23 1319 10/28/23 1755 10/29/23 0459 10/30/23 0455  NA 133*  --  130* 137 136 136  K 5.4* 4.8 4.8 4.4 4.6 5.0  CL 107  --  106 110 110 109  CO2 17*  --  18* 20* 19* 20*  GLUCOSE 504*  --  511* 235* 322* 343*  BUN 32*  --  18 16 16 15   CREATININE 1.29*  --  0.99 1.09* 1.14* 1.26*  CALCIUM  8.6*  --  8.1* 8.6* 8.0* 8.5*  MG  --   --   --  1.7 2.0 1.9   Liver Function Tests: Recent Labs  Lab 10/28/23 1319 10/28/23 1755 10/29/23 0459 10/30/23 0455  AST 16 17 17 16   ALT 18 18 17 16   ALKPHOS 108 107 100 96  BILITOT 0.8 0.8 1.0 0.7  PROT 5.8* 6.0* 5.2* 5.3*  ALBUMIN 2.6* 2.7* 2.3* 2.2*   Recent Labs  Lab 10/28/23 1755  LIPASE 30   No results for input(s): AMMONIA in the last 168 hours. CBC: Recent Labs  Lab 10/25/23 0028 10/28/23 1319 10/28/23 1557 10/29/23 0459 10/30/23 0455 10/30/23 2039  WBC 10.9* 11.3* 13.6* 11.3* 8.4  --   NEUTROABS  --  9.3* 11.2* 9.0* 6.4  --   HGB 8.1* 7.5* 8.4* 7.1* 6.6* 8.2*  HCT 24.4* 22.0* 26.1* 21.2* 19.6* 23.6*  MCV 88.7 87.3 92.2 88.3 86.7  --   PLT 116* 98* 121* 105* 109*  --    Cardiac Enzymes: No results for input(s): CKTOTAL, CKMB, CKMBINDEX, TROPONINI in the last 168 hours. BNP: Invalid input(s): POCBNP CBG: Recent Labs  Lab 10/30/23 0801 10/30/23 1113 10/30/23 1620 10/30/23 2023 10/31/23 0732  GLUCAP 308* 292* 196* 200* 137*   D-Dimer No results for input(s): DDIMER in the last 72 hours. Hgb A1c No results for input(s): HGBA1C in the last 72 hours. Lipid Profile No results for input(s): CHOL, HDL, LDLCALC, TRIG, CHOLHDL, LDLDIRECT in the last 72 hours. Thyroid  function studies No results for input(s): TSH, T4TOTAL, T3FREE, THYROIDAB in the last 72 hours.  Invalid input(s): FREET3 Anemia work up Recent Labs    10/28/23 1319  RETICCTPCT 12.4*   Urinalysis    Component Value Date/Time   COLORURINE YELLOW 10/28/2023 1924   APPEARANCEUR CLOUDY (A) 10/28/2023 1924    LABSPEC 1.014 10/28/2023 1924   PHURINE 5.0 10/28/2023 1924   GLUCOSEU >=500 (A) 10/28/2023 1924   HGBUR LARGE (A) 10/28/2023 1924   BILIRUBINUR NEGATIVE 10/28/2023 1924   BILIRUBINUR negative 07/06/2023 1033   KETONESUR NEGATIVE 10/28/2023 1924   PROTEINUR >=300 (A) 10/28/2023 1924   UROBILINOGEN 0.2 07/06/2023 1033   NITRITE NEGATIVE 10/28/2023 1924   LEUKOCYTESUR LARGE (A) 10/28/2023 1924   Sepsis Labs Recent Labs  Lab 10/28/23 1319 10/28/23 1557 10/29/23 0459 10/30/23 0455  WBC 11.3* 13.6* 11.3* 8.4   Microbiology Recent Results (from the past 240 hours)  Resp panel by RT-PCR (RSV, Flu A&B, Covid) Anterior Nasal Swab     Status: None   Collection Time: 10/28/23  4:20 PM   Specimen: Anterior Nasal Swab  Result Value Ref Range Status   SARS Coronavirus 2 by RT PCR NEGATIVE NEGATIVE Final   Influenza A by PCR NEGATIVE NEGATIVE Final   Influenza B by PCR NEGATIVE NEGATIVE Final    Comment: (NOTE) The Xpert Xpress SARS-CoV-2/FLU/RSV plus assay is intended as an aid in the diagnosis of influenza from Nasopharyngeal swab specimens and should not be used as a sole basis for treatment. Nasal washings and aspirates are unacceptable for Xpert Xpress SARS-CoV-2/FLU/RSV testing.  Fact Sheet for Patients: BloggerCourse.com  Fact Sheet for Healthcare Providers: SeriousBroker.it  This test is not yet approved or cleared by the [place]  FDA and has been authorized for detection and/or diagnosis of SARS-CoV-2 by FDA under an Emergency Use Authorization (  EUA). This EUA will remain in effect (meaning this test can be used) for the duration of the COVID-19 declaration under Section 564(b)(1) of the Act, 21 U.S.C. section 360bbb-3(b)(1), unless the authorization is terminated or revoked.     Resp Syncytial Virus by PCR NEGATIVE NEGATIVE Final    Comment: (NOTE) Fact Sheet for  Patients: BloggerCourse.com  Fact Sheet for Healthcare Providers: SeriousBroker.it  This test is not yet approved or cleared by the [place]  FDA and has been authorized for detection and/or diagnosis of SARS-CoV-2 by FDA under an Emergency Use Authorization (EUA). This EUA will remain in effect (meaning this test can be used) for the duration of the COVID-19 declaration under Section 564(b)(1) of the Act, 21 U.S.C. section 360bbb-3(b)(1), unless the authorization is terminated or revoked.  Performed at [facility], [address], [place], [place] [address]   Urine Culture     Status: Abnormal   Collection Time: 10/28/23  7:24 PM   Specimen: Urine, Clean Catch  Result Value Ref Range Status   Specimen Description URINE, CLEAN CATCH  Final   Special Requests   Final    NONE Performed at [facility], [address], [place], [place] [address]    Culture (A)  Final    30,000 COLONIES/mL ESCHERICHIA COLI Confirmed Extended Spectrum Beta-Lactamase Producer (ESBL).  In bloodstream infections from ESBL organisms, carbapenems are preferred over piperacillin /tazobactam. They are shown to have a lower risk of mortality.    Report Status 10/31/2023 FINAL  Final   Organism ID, Bacteria ESCHERICHIA COLI (A)  Final      Susceptibility   Escherichia coli - MIC*    AMPICILLIN  >=32 RESISTANT Resistant     CEFAZOLIN  Value in next row Resistant      >=32 RESISTANTThis is a modified FDA-approved test that has been validated and its performance characteristics determined by the reporting laboratory.  This laboratory is certified under the Clinical Laboratory Improvement Amendments CLIA as qualified to perform high complexity clinical laboratory testing.    CEFEPIME  Value in next row Resistant      >=32 RESISTANTThis is a modified FDA-approved test that has been validated and its performance characteristics determined by the  reporting laboratory.  This laboratory is certified under the Clinical Laboratory Improvement Amendments CLIA as qualified to perform high complexity clinical laboratory testing.    ERTAPENEM Value in next row Sensitive      >=32 RESISTANTThis is a modified FDA-approved test that has been validated and its performance characteristics determined by the reporting laboratory.  This laboratory is certified under the Clinical Laboratory Improvement Amendments CLIA as qualified to perform high complexity clinical laboratory testing.    CEFTRIAXONE  Value in next row Resistant      >=32 RESISTANTThis is a modified FDA-approved test that has been validated and its performance characteristics determined by the reporting laboratory.  This laboratory is certified under the Clinical Laboratory Improvement Amendments CLIA as qualified to perform high complexity clinical laboratory testing.    CIPROFLOXACIN Value in next row Resistant      >=32 RESISTANTThis is a modified FDA-approved test that has been validated and its performance characteristics determined by the reporting laboratory.  This laboratory is certified under the Clinical Laboratory Improvement Amendments CLIA as qualified to perform high complexity clinical laboratory testing.    GENTAMICIN Value in next row Sensitive      >=32 RESISTANTThis is a modified FDA-approved test that has been validated and its performance characteristics determined by  the reporting laboratory.  This laboratory is certified under the Clinical Laboratory Improvement Amendments CLIA as qualified to perform high complexity clinical laboratory testing.    NITROFURANTOIN  Value in next row Sensitive      >=32 RESISTANTThis is a modified FDA-approved test that has been validated and its performance characteristics determined by the reporting laboratory.  This laboratory is certified under the Clinical Laboratory Improvement Amendments CLIA as qualified to perform high complexity  clinical laboratory testing.    TRIMETH /SULFA  Value in next row Resistant      >=32 RESISTANTThis is a modified FDA-approved test that has been validated and its performance characteristics determined by the reporting laboratory.  This laboratory is certified under the Clinical Laboratory Improvement Amendments CLIA as qualified to perform high complexity clinical laboratory testing.    AMPICILLIN /SULBACTAM Value in next row Sensitive      >=32 RESISTANTThis is a modified FDA-approved test that has been validated and its performance characteristics determined by the reporting laboratory.  This laboratory is certified under the Clinical Laboratory Improvement Amendments CLIA as qualified to perform high complexity clinical laboratory testing.    PIP/TAZO Value in next row Sensitive ug/mL     <=4 SENSITIVEThis is a modified FDA-approved test that has been validated and its performance characteristics determined by the reporting laboratory.  This laboratory is certified under the Clinical Laboratory Improvement Amendments CLIA as qualified to perform high complexity clinical laboratory testing.    MEROPENEM Value in next row Sensitive      <=4 SENSITIVEThis is a modified FDA-approved test that has been validated and its performance characteristics determined by the reporting laboratory.  This laboratory is certified under the Clinical Laboratory Improvement Amendments CLIA as qualified to perform high complexity clinical laboratory testing.    * 30,000 COLONIES/mL ESCHERICHIA COLI  Blood culture (routine x 2)     Status: None (Preliminary result)   Collection Time: 10/28/23  8:16 PM   Specimen: BLOOD RIGHT ARM  Result Value Ref Range Status   Specimen Description BLOOD RIGHT ARM  Final   Special Requests   Final    BOTTLES DRAWN AEROBIC AND ANAEROBIC Blood Culture adequate volume   Culture   Final    NO GROWTH 2 DAYS Performed at [facility], [address] [place], [place] [address]     Report Status PENDING  Incomplete  Blood culture (routine x 2)     Status: None (Preliminary result)   Collection Time: 10/28/23  8:16 PM   Specimen: BLOOD LEFT ARM  Result Value Ref Range Status   Specimen Description BLOOD LEFT ARM  Final   Special Requests   Final    BOTTLES DRAWN AEROBIC AND ANAEROBIC Blood Culture adequate volume   Culture   Final    NO GROWTH 2 DAYS Performed at [facility], [address], [place], [place] [address]    Report Status PENDING  Incomplete     Time coordinating discharge: Over 35 minutes  SIGNED:   Erle Odell Castor, MD  [facility] 10/31/2023, 7:55 AM Pager   If 7PM-7AM, please contact night-coverage [email or web address] Password [email or web address]

## 2023-10-31 NOTE — Progress Notes (Signed)
 DISCHARGE NOTE HOME Melinda Stone to be discharged Home per MD order. Discussed prescriptions and follow up appointments with the patient. Prescriptions given to patient; medication list explained in detail. Patient verbalized understanding.  Skin clean, dry and intact without evidence of skin break down, no evidence of skin tears noted. IV catheter discontinued intact. Site without signs and symptoms of complications. Dressing and pressure applied. Pt denies pain at the site currently. No complaints noted.  Patient free of lines, drains, and wounds.   An After Visit Summary (AVS) was printed and given to the patient. Patient escorted via wheelchair, and discharged home via private auto.  Doyal Sias, RN

## 2023-11-01 ENCOUNTER — Telehealth: Payer: Self-pay

## 2023-11-01 DIAGNOSIS — D649 Anemia, unspecified: Secondary | ICD-10-CM

## 2023-11-01 DIAGNOSIS — D508 Other iron deficiency anemias: Secondary | ICD-10-CM

## 2023-11-01 NOTE — Telephone Encounter (Signed)
 Pt has been discharged from hosptial. Please schedule and call pt with appt details:   NEXT WEEK:   Lab/MD/ retacrit  D1 Poss blood D2

## 2023-11-03 LAB — CULTURE, BLOOD (ROUTINE X 2)
Culture: NO GROWTH
Culture: NO GROWTH
Special Requests: ADEQUATE
Special Requests: ADEQUATE

## 2023-11-04 ENCOUNTER — Ambulatory Visit: Admitting: Obstetrics and Gynecology

## 2023-11-04 ENCOUNTER — Other Ambulatory Visit (HOSPITAL_COMMUNITY)
Admission: RE | Admit: 2023-11-04 | Discharge: 2023-11-04 | Disposition: A | Discharge status: Home / Self Care | Source: Ambulatory Visit | Attending: Obstetrics and Gynecology | Admitting: Obstetrics and Gynecology

## 2023-11-04 ENCOUNTER — Encounter: Payer: Self-pay | Admitting: Obstetrics and Gynecology

## 2023-11-04 VITALS — BP 153/84 | HR 88 | Ht 69.0 in | Wt 245.0 lb

## 2023-11-04 DIAGNOSIS — Z1231 Encounter for screening mammogram for malignant neoplasm of breast: Secondary | ICD-10-CM

## 2023-11-04 DIAGNOSIS — N761 Subacute and chronic vaginitis: Secondary | ICD-10-CM

## 2023-11-04 DIAGNOSIS — Z9889 Other specified postprocedural states: Secondary | ICD-10-CM

## 2023-11-04 MED ORDER — FLUCONAZOLE 150 MG PO TABS: 150.0000 mg | ORAL_TABLET | Freq: Once | ORAL | 0 refills | Status: AC

## 2023-11-04 NOTE — Progress Notes (Addendum)
 Post Op Follow up,  c/o vaginal discharge. Self swab done. BP elevated, Pt took meds 30 mins ago.

## 2023-11-04 NOTE — Progress Notes (Signed)
 43 yo here for post op check following endometrial ablation in 09/2023. Patient reports vaginal bleeding for the first 2 weeks following her surgery and no further episodes of vaginal bleeding. Patient reports recent diagnosis of UTI and is taking antibiotics. She reports a pruritic vaginal discharge which is consistent with yeast infection in her opinion. Patient is without any other complaints  Past Medical History:  Diagnosis Date   Anemia    Arthritis 08/17/2020   Chronic back pain    Diabetes mellitus without complication (HCC)    type 2   GERD (gastroesophageal reflux disease)    Hypertension    Migraine    Ovarian cyst    Pneumonia    Polyneuropathy    Past Surgical History:  Procedure Laterality Date   CESAREAN SECTION     CHOLECYSTECTOMY  2004   EYE SURGERY Left    cataract   HYSTEROSCOPY N/A 10/01/2023   Procedure: ABLATION, ENDOMETRIUM, HYSTEROSCOPIC;  Surgeon: Alger Gong, MD;  Location: [facility] OR;  Service: Gynecology;  Laterality: N/A;   IR BONE MARROW BIOPSY & ASPIRATION  08/01/2023   LUMBAR LAMINECTOMY N/A 05/30/2020   Procedure: Lumbar five Laminectomy,  Bilateral Microdiscectomy, Left Lumbar five -Sacral one Microdiscectomy;  Surgeon: Barbarann Oneil BROCKS, MD;  Location: [facility] OR;  Service: Orthopedics;  Laterality: N/A;   LUMBAR LAMINECTOMY Left 11/14/2020   Procedure: LEFT LUMBAR FOUR-FIVE MICRODISCECTOMY;  Surgeon: Barbarann Oneil BROCKS, MD;  Location: [facility] OR;  Service: Orthopedics;  Laterality: Left;   TUBAL LIGATION  10/19/2010   Family History  Problem Relation Age of Onset   Diabetes Mother    Hypertension Mother    Stroke Mother    Social History   Tobacco Use   Smoking status: Former    Types: Cigars    Quit date: 12/23/2021    Years since quitting: 1.8   Smokeless tobacco: Never   Tobacco comments:    2 cigars daily. Khj 02/12/2023  Vaping Use   Vaping status: Never Used  Substance Use Topics   Alcohol  use: Not Currently   Drug use: Not Currently    Comment:  per patient stopped smoking marijuana Mar 2022   ROS See pertinent in HPI. All other systems reviewed and non contributory Blood pressure (!) 153/84, pulse 88, height 5' 9 (1.753 m), weight 245 lb (111.1 kg), last menstrual period 10/01/2023. GENERAL: Well-developed, well-nourished female in no acute distress.  ABDOMEN: Soft, nontender, nondistended. No organomegaly. EXTREMITIES: No cyanosis, clubbing, or edema, 2+ distal pulses.  A/P 43 yo here for post op check - Self swab collected - Patient will be contacted with abnormal results - Rx diflucan  provided for presumed yeast infection - Patient advised to keep a menstrual calendar. Reminded the patient that the goal of the ablation was not to eliminate her cycle but rather make them more manageable

## 2023-11-05 ENCOUNTER — Inpatient Hospital Stay

## 2023-11-05 ENCOUNTER — Encounter: Payer: Self-pay | Admitting: Oncology

## 2023-11-05 ENCOUNTER — Inpatient Hospital Stay (HOSPITAL_BASED_OUTPATIENT_CLINIC_OR_DEPARTMENT_OTHER): Admitting: Oncology

## 2023-11-05 ENCOUNTER — Ambulatory Visit: Payer: Self-pay | Admitting: Obstetrics and Gynecology

## 2023-11-05 ENCOUNTER — Other Ambulatory Visit: Payer: Self-pay | Admitting: Obstetrics and Gynecology

## 2023-11-05 VITALS — BP 130/83 | HR 84 | Temp 97.0°F | Resp 18 | Wt 246.9 lb

## 2023-11-05 DIAGNOSIS — Z23 Encounter for immunization: Secondary | ICD-10-CM

## 2023-11-05 DIAGNOSIS — D696 Thrombocytopenia, unspecified: Secondary | ICD-10-CM | POA: Diagnosis not present

## 2023-11-05 DIAGNOSIS — R162 Hepatomegaly with splenomegaly, not elsewhere classified: Secondary | ICD-10-CM

## 2023-11-05 DIAGNOSIS — D649 Anemia, unspecified: Secondary | ICD-10-CM

## 2023-11-05 DIAGNOSIS — E538 Deficiency of other specified B group vitamins: Secondary | ICD-10-CM

## 2023-11-05 DIAGNOSIS — D508 Other iron deficiency anemias: Secondary | ICD-10-CM

## 2023-11-05 DIAGNOSIS — R700 Elevated erythrocyte sedimentation rate: Secondary | ICD-10-CM | POA: Insufficient documentation

## 2023-11-05 DIAGNOSIS — N1831 Chronic kidney disease, stage 3a: Secondary | ICD-10-CM | POA: Diagnosis present

## 2023-11-05 DIAGNOSIS — D631 Anemia in chronic kidney disease: Secondary | ICD-10-CM | POA: Diagnosis present

## 2023-11-05 DIAGNOSIS — D519 Vitamin B12 deficiency anemia, unspecified: Secondary | ICD-10-CM

## 2023-11-05 LAB — CERVICOVAGINAL ANCILLARY ONLY
Bacterial Vaginitis (gardnerella): NEGATIVE
Candida Glabrata: NEGATIVE
Candida Vaginitis: POSITIVE — AB
Chlamydia: NEGATIVE
Comment: NEGATIVE
Comment: NEGATIVE
Comment: NEGATIVE
Comment: NEGATIVE
Comment: NEGATIVE
Comment: NORMAL
Neisseria Gonorrhea: NEGATIVE
Trichomonas: POSITIVE — AB

## 2023-11-05 LAB — SAMPLE TO BLOOD BANK

## 2023-11-05 LAB — CBC WITH DIFFERENTIAL (CANCER CENTER ONLY)
Abs Immature Granulocytes: 0.08 K/uL — ABNORMAL HIGH (ref 0.00–0.07)
Basophils Absolute: 0.1 K/uL (ref 0.0–0.1)
Basophils Relative: 1 %
Eosinophils Absolute: 0 K/uL (ref 0.0–0.5)
Eosinophils Relative: 0 %
HCT: 24.1 % — ABNORMAL LOW (ref 36.0–46.0)
Hemoglobin: 8.1 g/dL — ABNORMAL LOW (ref 12.0–15.0)
Immature Granulocytes: 1 %
Lymphocytes Relative: 19 %
Lymphs Abs: 1.7 K/uL (ref 0.7–4.0)
MCH: 28.8 pg (ref 26.0–34.0)
MCHC: 33.6 g/dL (ref 30.0–36.0)
MCV: 85.8 fL (ref 80.0–100.0)
Monocytes Absolute: 0.5 K/uL (ref 0.1–1.0)
Monocytes Relative: 6 %
Neutro Abs: 6.6 K/uL (ref 1.7–7.7)
Neutrophils Relative %: 73 %
Platelet Count: 152 K/uL (ref 150–400)
RBC: 2.81 MIL/uL — ABNORMAL LOW (ref 3.87–5.11)
RDW: 18 % — ABNORMAL HIGH (ref 11.5–15.5)
WBC Count: 9 K/uL (ref 4.0–10.5)
nRBC: 0 % (ref 0.0–0.2)

## 2023-11-05 MED ORDER — METRONIDAZOLE 500 MG PO TABS: 500.0000 mg | ORAL_TABLET | Freq: Two times a day (BID) | ORAL | 0 refills | Status: DC

## 2023-11-05 MED ORDER — EPOETIN ALFA-EPBX 40000 UNIT/ML IJ SOLN
40000.0000 [IU] | Freq: Once | INTRAMUSCULAR | Status: AC
  Administered 2023-11-05: 40000 [IU] via SUBCUTANEOUS
  Filled 2023-11-05: qty 1

## 2023-11-05 MED ORDER — FLUCONAZOLE 150 MG PO TABS: 150.0000 mg | ORAL_TABLET | Freq: Once | ORAL | 0 refills | Status: AC

## 2023-11-05 NOTE — Progress Notes (Signed)
 Hematology/Oncology Progress note [phone] [phone]     Patient Care Team: [name], NP as PCP - General (Nurse Practitioner) Michele Richardson, DO as PCP - Cardiology (Cardiology) Babara Call, MD as Consulting Physician (Oncology)  ASSESSMENT & PLAN:   Hepatosplenomegaly Possibly due to fatty liver disease. Negative hepatitis B and C Negative CMV HIV parvovirus Negative BCR ABL1 FISH, JAK2 mutation. ?  Evan syndrome Repeat ultrasound abdomen.  Normocytic anemia Refractory to blood transfusion.  low haptoglobin, normal Ldh, normal bilirubin.  no M protein on myeloma panel, negative parvo virus DNA PCR, CMV ? Ineffective erythropoiesis vs hemolysis. 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 Suspect secondary process. Normal cytogenetics, normal myeloid NGS  She also has decreased platelet counts and previously responded to steroids.  Negative ANA, negative CRP, CCP, positive ESR, negative cold agglutinin. Steroid has helped maintaining her hemoglobin and she has not needed blood transfusion since end of June 2025.  However patient has developed side effects including infection and hypoglycemia.  Status post 2 units of PRBC transfusion during her recent hospitalization.  anemia secondary to chronic kidney disease may also contribute to anemia.  Recommend trials of erythropoietin therapy.  Rationale and potential side effects were reviewed and discussed with patient.  She agrees. Will proceed with Retacrit  40,000 units  Low serum vitamin B12 S/p vitamin B12 injections, she did not tolerate due to muscle cramps.  B12 level stable continue sublingual B12 1000mcg daily.    Thrombocytopenia (HCC) Normalized.  Previously responded to steroids.  ESR raised Arthralgia, elevated ESR.  Refer to rheumatology for  further evaluation.   Orders Placed This Encounter  Procedures   CBC with Differential (Cancer Center Only)    Standing Status:   Future    Expected Date:   11/19/2023    Expiration Date:   02/17/2024   Retic Panel    Standing Status:   Future    Expected Date:   11/19/2023    Expiration Date:   02/17/2024   Hemoglobin and Hematocrit (Cancer Center Only)    Standing Status:   Standing    Number of Occurrences:   3    Expiration Date:   11/04/2024   Sample to Blood Bank    Standing Status:   Future    Expected Date:   11/19/2023    Expiration Date:   02/17/2024   Sample to Blood Bank    Standing Status:   Standing    Number of Occurrences:   3    Expiration Date:   11/04/2024    Follow-up per LOS  All questions were answered. The patient knows to call the clinic with any problems, questions or concerns.  Call Babara, MD, PhD [facility] Hematology Oncology 11/05/2023   CHIEF COMPLAINTS/REASON FOR VISIT:  iron  deficiency anemia  HISTORY OF PRESENTING ILLNESS:   Melinda Stone is a  43 y.o.  female presents for iron  deficiency anemia.  Patient had blood work done on 07/13/2021, CBC showed hemoglobin of 9.0, MCV 75.3, ferritin 14, iron  saturation 21, TIBC 455.  Reviewed her previous lab records.  Patient has chronic anemia since at least 2019.  + Nausea, no vomiting.  Chronic diarrhea after each meal.  Unintentional weight loss Patient reports that she has had colonoscopy done in May which did not reveal etiology of diarrhea.  Colonoscopy record was not available to me at the time of  dictation. 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  05/11/2023 - 05/23/2023 Patient was hospitalized due to hypoxic respiratory failure, influenza A, ARDS, pneumonia and she was discharged to rehab after hospitalization.  INTERVAL HISTORY Melinda Stone is a 43 y.o. female who has above history reviewed by me today presents for follow up visit  for iron  deficiency anemia Chronic fatigue , unchanged. No fever, chills, unintentional weight loss, night sweats.  Denies hematochezia, hematuria, hematemesis, epistaxis, black tarry stool or easy bruising.  Status post Hysteroscopy, Dilation and Curettage, Hydrothermal Endometrial Ablation. Endometrial biopsy negative for malignancy.   Today patient reports feeling better.  She admits that she cannot remember if she was taking steroids prior to her recent hospitalization.  It is likely that she did not. Currently off steroids.  Fatigue has improved since 2 units of PRBC transfusion during hospitalization.  She was treated for UTI.  No sinusitis.  Facial pain has decreased. She reports left knee swelling and pain.  Review of Systems  Constitutional:  Positive for fatigue. Negative for chills and fever.  HENT:   Negative for hearing loss and voice change.   Eyes:  Negative for eye problems.  Respiratory:  Positive for cough. Negative for chest tightness and wheezing.   Cardiovascular:  Negative for chest pain.  Gastrointestinal:  Negative for abdominal distention, abdominal pain, blood in stool, diarrhea, nausea and vomiting.  Endocrine: Negative for hot flashes.  Genitourinary:  Negative for difficulty urinating and frequency.   Musculoskeletal:  Positive for arthralgias and back pain.  Skin:  Negative for itching and rash.  Neurological:  Positive for headaches and numbness. Negative for extremity weakness.  Hematological:  Negative for adenopathy.  Psychiatric/Behavioral:  Negative for confusion.     MEDICAL HISTORY:  Past Medical History:  Diagnosis Date   Anemia    Arthritis 08/17/2020   Chronic back pain    Diabetes mellitus without complication (HCC)    type 2   GERD (gastroesophageal reflux disease)    Hypertension    Migraine    Ovarian cyst    Pneumonia    Polyneuropathy     SURGICAL HISTORY: Past Surgical History:  Procedure Laterality Date   CESAREAN SECTION      CHOLECYSTECTOMY  2004   EYE SURGERY Left    cataract   HYSTEROSCOPY N/A 10/01/2023   Procedure: ABLATION, ENDOMETRIUM, HYSTEROSCOPIC;  Surgeon: Alger Gong, MD;  Location: [facility] OR;  Service: Gynecology;  Laterality: N/A;   IR BONE MARROW BIOPSY & ASPIRATION  08/01/2023   LUMBAR LAMINECTOMY N/A 05/30/2020   Procedure: Lumbar five Laminectomy,  Bilateral Microdiscectomy, Left Lumbar five -Sacral one Microdiscectomy;  Surgeon: Barbarann Oneil BROCKS, MD;  Location: [facility] OR;  Service: Orthopedics;  Laterality: N/A;   LUMBAR LAMINECTOMY Left 11/14/2020   Procedure: LEFT LUMBAR FOUR-FIVE MICRODISCECTOMY;  Surgeon: Barbarann Oneil BROCKS, MD;  Location: [facility] OR;  Service: Orthopedics;  Laterality: Left;   TUBAL LIGATION  10/19/2010    SOCIAL HISTORY: Social History   Socioeconomic History   Marital status: Single    Spouse name: Not on file   Number of children: 3   Years of education: Not on file   Highest education level: High school graduate  Occupational History    Comment: home maker  Tobacco Use   Smoking status: Former    Types: Cigars    Quit date: 12/23/2021    Years since quitting: 1.8   Smokeless tobacco: Never   Tobacco comments:  2 cigars daily. Khj 02/12/2023  Vaping Use   Vaping status: Never Used  Substance and Sexual Activity   Alcohol  use: Not Currently   Drug use: Not Currently    Comment: per patient stopped smoking marijuana Mar 2022   Sexual activity: Yes    Birth control/protection: Surgical  Other Topics Concern   Not on file  Social History Narrative   Lives with 3 children   Some coffee   Right handed   Doesn't work   Social Drivers of Corporate investment banker Strain: Not on file  Food Insecurity: No Food Insecurity (10/29/2023)   Hunger Vital Sign    Worried About Running Out of Food in the Last Year: Never true    Ran Out of Food in the Last Year: Never true  Transportation Needs: No Transportation Needs (10/29/2023)   PRAPARE - Scientist, research (physical sciences) (Medical): No    Lack of Transportation (Non-Medical): No  Physical Activity: Not on file  Stress: Not on file  Social Connections: Moderately Isolated (07/31/2023)   Social Connection and Isolation Panel    Frequency of Communication with Friends and Family: Twice a week    Frequency of Social Gatherings with Friends and Family: Twice a week    Attends Religious Services: 1 to 4 times per year    Active Member of [employer] or Organizations: No    Attends Banker Meetings: Never    Marital Status: Never married  Intimate Partner Violence: Not At Risk (10/29/2023)   Humiliation, Afraid, Rape, and Kick questionnaire    Fear of Current or Ex-Partner: No    Emotionally Abused: No    Physically Abused: No    Sexually Abused: No    FAMILY HISTORY: Family History  Problem Relation Age of Onset   Diabetes Mother    Hypertension Mother    Stroke Mother     ALLERGIES:  is allergic to trulicity  [dulaglutide ].  MEDICATIONS:  Current Outpatient Medications  Medication Sig Dispense Refill   acetaminophen  (TYLENOL ) 500 MG tablet Take 1,000 mg by mouth every 6 (six) hours as needed for mild pain (pain score 1-3) or moderate pain (pain score 4-6).     albuterol  (VENTOLIN  HFA) 108 (90 Base) MCG/ACT inhaler Inhale 2 puffs into the lungs every 6 (six) hours as needed for wheezing or shortness of breath. 18 g 2   amitriptyline  (ELAVIL ) 10 MG tablet Take 10 mg by mouth at bedtime.     Continuous Glucose Sensor (DEXCOM G7 SENSOR) MISC 1 Device by Does not apply route as directed. Change sensor every 10 days (Patient taking differently: Inject 1 Device into the skin See admin instructions. Change sensor every 10 days) 9 each 3   Cyanocobalamin  (VITAMIN B-12) 2500 MCG SUBL Place 1 tablet (2,500 mcg total) under the tongue daily. 30 tablet 2   ferrous sulfate  325 (65 FE) MG tablet Take 1 tablet (325 mg total) by mouth daily with breakfast. 30 tablet 0   fluconazole  (DIFLUCAN ) 150  MG tablet Take 1 tablet (150 mg total) by mouth once for 1 dose. 1 tablet 0   fluticasone  (FLONASE ) 50 MCG/ACT nasal spray Place 2 sprays into both nostrils daily. 16 g 0   furosemide  (LASIX ) 20 MG tablet Take 1 tablet (20 mg total) by mouth in the morning. 30 tablet 3   gabapentin  (NEURONTIN ) 800 MG tablet Take 800 mg by mouth 3 (three) times daily.     Galcanezumab -gnlm (EMGALITY ) 120 MG/ML  SOAJ Inject 120 mg into the skin every 30 (thirty) days. 1.12 mL 11   ibuprofen  (ADVIL ) 600 MG tablet Take 1 tablet (600 mg total) by mouth every 6 (six) hours as needed. 60 tablet 3   Insulin  Disposable Pump (OMNIPOD 5 G7 INTRO, GEN 5,) KIT 1 Device by Does not apply route every other day. 1 kit 0   Insulin  Disposable Pump (OMNIPOD 5 G7 PODS, GEN 5,) MISC 1 Device by Does not apply route every other day. 45 each 3   insulin  glargine (LANTUS  SOLOSTAR) 100 UNIT/ML Solostar Pen Inject 45 Units into the skin daily before breakfast. 45 mL 3   insulin  lispro (HUMALOG  KWIKPEN) 100 UNIT/ML KwikPen Inject 12-22 Units into the skin 3 (three) times daily. Max daily 60 mL 4   Insulin  Pen Needle 32G X 4 MM MISC 1 Device by Does not apply route in the morning, at noon, in the evening, and at bedtime. 400 each 3   ketorolac  (ACULAR ) 0.5 % ophthalmic solution Place 1 drop into the right eye 4 (four) times daily.     levofloxacin  (LEVAQUIN ) 750 MG tablet Take 1 tablet (750 mg total) by mouth daily for 10 days. 10 tablet 0   losartan  (COZAAR ) 25 MG tablet Take 1 tablet (25 mg total) by mouth daily. 30 tablet 3   metoprolol  tartrate (LOPRESSOR ) 25 MG tablet Take 1 tablet (25 mg total) by mouth 2 (two) times daily. 60 tablet 0   mometasone -formoterol  (DULERA ) 100-5 MCG/ACT AERO Inhale 2 puffs into the lungs 2 (two) times daily at 10 AM and 5 PM. 13 g 0   Multiple Vitamin (MULTIVITAMIN WITH MINERALS) TABS tablet Take 1 tablet by mouth daily.     ofloxacin (OCUFLOX) 0.3 % ophthalmic solution Place 1 drop into the right eye 4 (four)  times daily.     oxyCODONE -acetaminophen  (PERCOCET) 7.5-325 MG tablet Take 1 tablet by mouth 4 (four) times daily as needed for moderate pain (pain score 4-6).     pantoprazole  (PROTONIX ) 40 MG tablet Take 1 tablet (40 mg total) by mouth daily. 30 tablet 0   prednisoLONE acetate (PRED FORTE) 1 % ophthalmic suspension Place 1 drop into the right eye 4 (four) times daily.     predniSONE  (DELTASONE ) 10 MG tablet Takes 3 tablets for 1 day, then 2 tabs for 1 day, then 1 tab for 1 day, and then stop. 6 tablet 0   Rimegepant Sulfate (NURTEC) 75 MG TBDP Take 1 tablet (75 mg total) by mouth as needed. Take 1 tablet at onset of headache, max is 1 tablet in 24 hours. 8 tablet 11   tiZANidine  (ZANAFLEX ) 4 MG tablet Take 4 mg by mouth every 12 (twelve) hours as needed for muscle spasms.     vitamin C  (ASCORBIC ACID ) 250 MG tablet Take 2 tablets (500 mg total) by mouth daily. 180 tablet 0   metroNIDAZOLE  (FLAGYL ) 500 MG tablet Take 1 tablet (500 mg total) by mouth 2 (two) times daily. 14 tablet 0   oxyCODONE -acetaminophen  (PERCOCET/ROXICET) 5-325 MG tablet Take 1 tablet by mouth every 6 (six) hours as needed. (Patient not taking: Reported on 11/05/2023) 20 tablet 0   No current facility-administered medications for this visit.     PHYSICAL EXAMINATION: ECOG PERFORMANCE STATUS: 2 - Symptomatic, <50% confined to bed Vitals:   11/05/23 1527  BP: 130/83  Pulse: 84  Resp: 18  Temp: (!) 97 F (36.1 C)  SpO2: 100%   Filed Weights   11/05/23 1527  Weight: 246 lb 14.4 oz (112 kg)      Physical Exam Constitutional:      General: She is not in acute distress.    Comments: Patient sits in the wheelchair  HENT:     Head: Normocephalic and atraumatic.  Eyes:     General: No scleral icterus. Cardiovascular:     Rate and Rhythm: Normal rate and regular rhythm.     Heart sounds: Normal heart sounds.  Pulmonary:     Effort: Pulmonary effort is normal. No respiratory distress.     Breath sounds: No  wheezing.  Abdominal:     General: Bowel sounds are normal. There is no distension.     Palpations: Abdomen is soft.  Musculoskeletal:        General: No deformity. Normal range of motion.     Cervical back: Normal range of motion and neck supple.  Skin:    General: Skin is warm and dry.     Coloration: Skin is not pale.     Findings: No erythema or rash.  Neurological:     Mental Status: She is alert and oriented to person, place, and time. Mental status is at baseline.  Psychiatric:        Mood and Affect: Mood normal.     LABORATORY DATA:  I have reviewed the data as listed    Latest Ref Rng & Units 11/05/2023    3:13 PM 10/30/2023    8:39 PM 10/30/2023    4:55 AM  CBC  WBC 4.0 - 10.5 K/uL 9.0   8.4   Hemoglobin 12.0 - 15.0 g/dL 8.1  8.2  6.6   Hematocrit 36.0 - 46.0 % 24.1  23.6  19.6   Platelets 150 - 400 K/uL 152   109       Latest Ref Rng & Units 10/30/2023    4:55 AM 10/29/2023    4:59 AM 10/28/2023    5:55 PM  CMP  Glucose 70 - 99 mg/dL 656  677  764   BUN 6 - 20 mg/dL 15  16  16    Creatinine 0.44 - 1.00 mg/dL 8.73  8.85  8.90   Sodium 135 - 145 mmol/L 136  136  137   Potassium 3.5 - 5.1 mmol/L 5.0  4.6  4.4   Chloride 98 - 111 mmol/L 109  110  110   CO2 22 - 32 mmol/L 20  19  20    Calcium  8.9 - 10.3 mg/dL 8.5  8.0  8.6   Total Protein 6.5 - 8.1 g/dL 5.3  5.2  6.0   Total Bilirubin 0.0 - 1.2 mg/dL 0.7  1.0  0.8   Alkaline Phos 38 - 126 U/L 96  100  107   AST 15 - 41 U/L 16  17  17    ALT 0 - 44 U/L 16  17  18       Iron /TIBC/Ferritin/ %Sat    Component Value Date/Time   IRON  90 07/23/2023 1548   IRON  64 01/02/2019 0955   TIBC 353 07/23/2023 1548   TIBC 382 01/02/2019 0955   FERRITIN 206 07/23/2023 1548   FERRITIN 21 01/02/2019 0955   IRONPCTSAT 26 07/23/2023 1548   IRONPCTSAT 17 01/02/2019 0955      RADIOGRAPHIC STUDIES: I have personally reviewed the radiological images as listed and agreed with the findings in the report. CT Head Wo  Contrast Result Date: 10/28/2023 CLINICAL DATA:  Headache. EXAM: CT HEAD WITHOUT CONTRAST TECHNIQUE: Contiguous axial  images were obtained from the base of the skull through the vertex without intravenous contrast. RADIATION DOSE REDUCTION: This exam was performed according to the departmental dose-optimization program which includes automated exposure control, adjustment of the mA and/or kV according to patient size and/or use of iterative reconstruction technique. COMPARISON:  May 18, 2023 FINDINGS: Brain: No evidence of acute infarction, hemorrhage, hydrocephalus, extra-axial collection or mass lesion/mass effect. Vascular: No hyperdense vessel or unexpected calcification. Skull: A chronic fracture deformity is seen involving the right zygomatic arch. Sinuses/Orbits: There is marked severity bilateral ethmoid sinus mucosal thickening. Moderate severity sphenoid sinus mucosal thickening is also seen. Small to moderate size bilateral maxillary sinus air-fluid levels are noted. Other: None. IMPRESSION: 1. No acute intracranial abnormality. 2. Marked severity bilateral ethmoid sinus and moderate severity sphenoid sinus disease. 3. Small to moderate size bilateral maxillary sinus air-fluid levels, consistent with acute sinusitis. Electronically Signed   By: Suzen Dials M.D.   On: 10/28/2023 20:08   DG Chest 2 View Result Date: 10/28/2023 CLINICAL DATA:  Short of breath, cough EXAM: CHEST - 2 VIEW COMPARISON:  None Available. FINDINGS: Normal mediastinum and cardiac silhouette. Normal pulmonary vasculature. No evidence of effusion, infiltrate, or pneumothorax. No acute bony abnormality. IMPRESSION: No acute cardiopulmonary process. Electronically Signed   By: Jackquline Boxer M.D.   On: 10/28/2023 17:25   DG Chest 2 View Result Date: 10/24/2023 CLINICAL DATA:  Chest pain. EXAM: CHEST - 2 VIEW COMPARISON:  05/14/2023. FINDINGS: Bilateral lung fields are clear. Bilateral costophrenic angles are clear. Normal  cardio-mediastinal silhouette. No acute osseous abnormalities. The soft tissues are within normal limits. IMPRESSION: No active cardiopulmonary disease. Electronically Signed   By: Ree Molt M.D.   On: 10/24/2023 17:59

## 2023-11-05 NOTE — Assessment & Plan Note (Signed)
 Arthralgia, elevated ESR.  Refer to rheumatology for further evaluation.

## 2023-11-05 NOTE — Assessment & Plan Note (Signed)
 S/p vitamin B12 injections, she did not tolerate due to muscle cramps.  B12 level stable continue sublingual B12 daily.

## 2023-11-05 NOTE — Assessment & Plan Note (Signed)
 Possibly due to fatty liver disease. Negative hepatitis B and C Negative CMV HIV parvovirus Negative BCR ABL1 FISH, JAK2 mutation. ?  Evan syndrome Repeat ultrasound abdomen.

## 2023-11-05 NOTE — Assessment & Plan Note (Signed)
 Normalized.  Previously responded to steroids.

## 2023-11-05 NOTE — Assessment & Plan Note (Signed)
 Refractory to blood transfusion.  low haptoglobin, normal Ldh, normal bilirubin.  no M protein on myeloma panel, negative parvo virus DNA PCR, CMV ? Ineffective erythropoiesis vs hemolysis. 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 Suspect secondary process. Normal cytogenetics, normal myeloid NGS  She also has decreased platelet counts and previously responded to steroids.  Negative ANA, negative CRP, CCP, positive ESR, negative cold agglutinin. Steroid has helped maintaining her hemoglobin and she has not needed blood transfusion since end of June 2025.  However patient has developed side effects including infection and hypoglycemia.  Status post 2 units of PRBC transfusion during her recent hospitalization.  anemia secondary to chronic kidney disease may also contribute to anemia.  Recommend trials of erythropoietin therapy.  Rationale and potential side effects were reviewed and discussed with patient.  She agrees. Will proceed with Retacrit  40,000 units

## 2023-11-06 ENCOUNTER — Inpatient Hospital Stay

## 2023-11-08 HISTORY — PX: CATARACT EXTRACTION: SUR2

## 2023-11-09 ENCOUNTER — Ambulatory Visit (HOSPITAL_COMMUNITY)
Admission: EM | Admit: 2023-11-09 | Discharge: 2023-11-09 | Disposition: A | Discharge status: Home / Self Care | Attending: Physician Assistant | Admitting: Physician Assistant

## 2023-11-09 ENCOUNTER — Encounter (HOSPITAL_COMMUNITY): Payer: Self-pay

## 2023-11-09 DIAGNOSIS — R600 Localized edema: Secondary | ICD-10-CM | POA: Diagnosis not present

## 2023-11-09 DIAGNOSIS — R059 Cough, unspecified: Secondary | ICD-10-CM

## 2023-11-09 MED ORDER — BENZONATATE 100 MG PO CAPS
100.0000 mg | ORAL_CAPSULE | Freq: Three times a day (TID) | ORAL | 0 refills | Status: DC
Start: 2023-11-09 — End: 2023-11-25

## 2023-11-09 NOTE — ED Triage Notes (Signed)
 Patient reports that she is having a non productive cough x 1 week and bilateral leg swelling x 3 days.  Patient states she has had robitussin and Nyquil for her cough.

## 2023-11-09 NOTE — Discharge Instructions (Signed)
 Can take Tessalon  as needed for cough. Continue to elevate your legs.  Wear compression socks daily.  Continue with Lasix  as prescribed. Make sure you keep your follow-up with your primary care physician later this week. If symptoms become worse or you have trouble with your breathing please return for evaluation

## 2023-11-11 NOTE — ED Provider Notes (Signed)
 [facility]    CSN: [id number] Arrival date & time: 11/09/23  1011      History   Chief Complaint Chief Complaint  Patient presents with   Cough   Leg Swelling    HPI Melinda Stone is a 43 y.o. female.   Patient was discharged 814 4 sepsis, per notes possibly attributed to sinusitis.  She reports continued cough since admission.  Overall feeling much better.  She completed Levaquin  a few days ago.  She has been taking Robitussin and NyQuil with minimal relief for cough.  Denies fever, chills,, shortness of breath, wheezing.  Patient also complains of bilateral lower extremity edema which is chronic.  She has been trying compression socks, elevation.  She does take Lasix  20 mg daily.  She has a hospital follow-up with her PCP in a few days.    Past Medical History:  Diagnosis Date   Anemia    Arthritis 08/17/2020   Chronic back pain    Diabetes mellitus without complication (HCC)    type 2   GERD (gastroesophageal reflux disease)    Hypertension    Migraine    Ovarian cyst    Pneumonia    Polyneuropathy     Patient Active Problem List   Diagnosis Date Noted   ESR raised 11/05/2023   Leukocytosis 10/29/2023   Tension headache 10/29/2023   Moderate persistent asthma 10/29/2023   Acute sinusitis 10/28/2023   Dysfunctional uterine bleeding 10/01/2023   AKI (acute kidney injury) (HCC) 09/05/2023   Urinary tract infection symptoms 09/05/2023   Thrombocytopenia (HCC) 08/01/2023   Anemia of chronic disease 07/31/2023   Metabolic acidosis 07/31/2023   Menorrhagia 07/31/2023   Prolapsed lumbosacral intervertebral disc 07/31/2023   Essential hypertension 07/31/2023   Anxiety 07/31/2023   DM2 (diabetes mellitus, type 2) (HCC) 07/31/2023   Chlamydia 07/31/2023   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, with long-term current use of insulin  (HCC) 06/21/2023   Hepatosplenomegaly 06/14/2023   Respiratory failure (HCC) 05/23/2023   Debility  05/21/2023   Type 2 diabetes mellitus with hypoglycemia without coma (HCC) 05/21/2023   Hyperglycemia 05/15/2023   Acute hypoxic respiratory failure (HCC) 05/12/2023   Influenza A 05/12/2023   ARDS (adult respiratory distress syndrome) (HCC) 05/12/2023   Pneumonia due to infectious organism 05/12/2023   Nausea and vomiting 05/11/2023   CAP (community acquired pneumonia) 03/26/2023   Sepsis (HCC) 03/26/2023   Cellulitis of fifth toe 03/26/2023   Multifocal pneumonia 03/25/2023   DKA (diabetic ketoacidosis) (HCC) 12/24/2022   DKA, type 2 (HCC) 12/24/2022   Migraine 07/11/2022   Hypokalemia 05/14/2022   Normocytic anemia 05/14/2022   Pneumonitis 05/13/2022   Grade I diastolic dysfunction 05/13/2022   Carpal tunnel syndrome 05/13/2022   Pain of breast 05/13/2022   Contusion of upper limb 05/13/2022   Obesity 05/13/2022   Overweight (BMI 25.0-29.9) 05/13/2022   Aspirat pneumonitis due to anesth during preg, first tri 05/05/2022   Need for prophylactic vaccination and inoculation against influenza 02/07/2022   Low serum vitamin B12 01/30/2022   IDA (iron  deficiency anemia) 01/30/2022   Mild intermittent asthma 01/15/2022   Esophageal reflux 01/15/2022   Dyspnea 09/01/2021   Pulmonary nodules 09/01/2021   Absolute anemia 09/01/2021   Tobacco use 09/01/2021   Hypotension 09/01/2021   Nausea & vomiting 06/08/2021   UTI (urinary tract infection) 06/08/2021   Post laminectomy syndrome 04/11/2021   HNP (herniated nucleus pulposus), lumbar 11/14/2020   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  Chronic pain syndrome 06/14/2020   Pharmacologic therapy 06/14/2020   Disorder of skeletal system 06/14/2020   Problems influencing health status 06/14/2020   History of marijuana use 06/14/2020   History of illicit drug use 06/14/2020   Abnormal drug screen (05/25/2020) 06/14/2020   Marijuana use 06/14/2020   Abnormal MRI, lumbar spine (05/16/2020) 06/14/2020   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, L5-S1) 06/14/2020   Lumbar lateral recess stenosis (Left: L4-5, L5-S1) 06/14/2020   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) 06/14/2020   S/P lumbar microdiscectomy 06/07/2020   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   GI bleed 12/01/2019   Muscle spasm 11/06/2019   Sciatica 11/06/2019   Low back pain 02/04/2019   Type 2 diabetes mellitus with diabetic polyneuropathy, without long-term current use of insulin  (HCC) 12/03/2018   Type 2 diabetes mellitus with hyperglycemia, without long-term current use of insulin  (HCC) 05/15/2018    Past Surgical History:  Procedure Laterality Date   CESAREAN SECTION     CHOLECYSTECTOMY  2004   EYE SURGERY Left    cataract   HYSTEROSCOPY N/A 10/01/2023   Procedure: ABLATION, ENDOMETRIUM, HYSTEROSCOPIC;  Surgeon: Alger Gong, MD;  Location: [facility];  Service: Gynecology;  Laterality: N/A;   IR BONE MARROW BIOPSY & ASPIRATION  08/01/2023   LUMBAR LAMINECTOMY N/A 05/30/2020   Procedure: Lumbar five Laminectomy,  Bilateral Microdiscectomy, Left Lumbar five -Sacral one Microdiscectomy;  Surgeon: Barbarann Oneil BROCKS, MD;  Location: [facility];  Service: Orthopedics;  Laterality: N/A;   LUMBAR LAMINECTOMY Left 11/14/2020   Procedure: LEFT LUMBAR FOUR-FIVE MICRODISCECTOMY;  Surgeon: Barbarann Oneil BROCKS, MD;  Location: [facility];  Service: Orthopedics;  Laterality: Left;   TUBAL LIGATION  10/19/2010    OB History     Gravida  6   Para      Term      Preterm      AB      Living  3      SAB      IAB      Ectopic      Multiple      Live Births               Home Medications    Prior to Admission medications   Medication Sig Start Date End Date Taking? Authorizing Provider  benzonatate  (TESSALON ) 100 MG capsule Take 1 capsule (100 mg total) by mouth every 8 (eight) hours. 11/09/23  Yes Ward, Harlene PEDLAR, PA-C  acetaminophen  (TYLENOL ) 500 MG tablet Take 1,000 mg by mouth every 6 (six) hours as needed for mild  pain (pain score 1-3) or moderate pain (pain score 4-6).    [provider]  albuterol  (VENTOLIN  HFA) 108 (90 Base) MCG/ACT inhaler Inhale 2 puffs into the lungs every 6 (six) hours as needed for wheezing or shortness of breath. 05/29/23   Setzer, Nena PARAS, PA-C  amitriptyline  (ELAVIL ) 10 MG tablet Take 10 mg by mouth at bedtime. 10/22/23   [provider]  Continuous Glucose Sensor (DEXCOM G7 SENSOR) MISC 1 Device by Does not apply route as directed. Change sensor every 10 days Patient taking differently: Inject 1 Device into the skin See admin instructions. Change sensor every 10 days 06/21/23   Shamleffer, Ibtehal Jaralla, MD  Cyanocobalamin  (VITAMIN B-12) 2500 MCG SUBL Place 1 tablet (2,500 mcg total) under the tongue daily. 04/23/23   Babara Call, MD  ferrous sulfate  325 (65 FE) MG tablet Take 1 tablet (325 mg total) by mouth daily  with breakfast. 05/29/23   Setzer, Sandra J, PA-C  fluticasone  (FLONASE ) 50 MCG/ACT nasal spray Place 2 sprays into both nostrils daily. 10/31/23   Odell Celinda Balo, MD  furosemide  (LASIX ) 20 MG tablet Take 1 tablet (20 mg total) by mouth in the morning. 07/24/23   Tolia, Sunit, DO  gabapentin  (NEURONTIN ) 800 MG tablet Take 800 mg by mouth 3 (three) times daily.    [provider]  Galcanezumab -gnlm (EMGALITY ) 120 MG/ML SOAJ Inject 120 mg into the skin every 30 (thirty) days. 08/20/23   Gayland Lauraine PARAS, NP  ibuprofen  (ADVIL ) 600 MG tablet Take 1 tablet (600 mg total) by mouth every 6 (six) hours as needed. Patient not taking: Reported on 11/09/2023 10/01/23   Constant, Peggy, MD  Insulin  Disposable Pump (OMNIPOD 5 G7 INTRO, GEN 5,) KIT 1 Device by Does not apply route every other day. 09/26/23   Shamleffer, Ibtehal Jaralla, MD  Insulin  Disposable Pump (OMNIPOD 5 G7 PODS, GEN 5,) MISC 1 Device by Does not apply route every other day. 09/26/23   Shamleffer, Ibtehal Jaralla, MD  insulin  glargine (LANTUS  SOLOSTAR) 100 UNIT/ML Solostar Pen Inject 45 Units into the  skin daily before breakfast. 09/26/23   Shamleffer, Ibtehal Jaralla, MD  insulin  lispro (HUMALOG  KWIKPEN) 100 UNIT/ML KwikPen Inject 12-22 Units into the skin 3 (three) times daily. Max daily 09/26/23   Shamleffer, Ibtehal Jaralla, MD  Insulin  Pen Needle 32G X 4 MM MISC 1 Device by Does not apply route in the morning, at noon, in the evening, and at bedtime. 09/26/23   Shamleffer, Ibtehal Jaralla, MD  ketorolac  (ACULAR ) 0.5 % ophthalmic solution Place 1 drop into the right eye 4 (four) times daily. 10/24/23   [provider]  losartan  (COZAAR ) 25 MG tablet Take 1 tablet (25 mg total) by mouth daily. 07/16/23   Tolia, Sunit, DO  metoprolol  tartrate (LOPRESSOR ) 25 MG tablet Take 1 tablet (25 mg total) by mouth 2 (two) times daily. 05/29/23 11/05/23  Setzer, Nena PARAS, PA-C  metroNIDAZOLE  (FLAGYL ) 500 MG tablet Take 1 tablet (500 mg total) by mouth 2 (two) times daily. 11/05/23   Constant, Peggy, MD  mometasone -formoterol  (DULERA ) 100-5 MCG/ACT AERO Inhale 2 puffs into the lungs 2 (two) times daily at 10 AM and 5 PM. 05/29/23   Setzer, Sandra J, PA-C  Multiple Vitamin (MULTIVITAMIN WITH MINERALS) TABS tablet Take 1 tablet by mouth daily. 05/29/23   Setzer, Sandra J, PA-C  ofloxacin (OCUFLOX) 0.3 % ophthalmic solution Place 1 drop into the right eye 4 (four) times daily. 10/24/23   [provider]  oxyCODONE -acetaminophen  (PERCOCET) 7.5-325 MG tablet Take 1 tablet by mouth 4 (four) times daily as needed for moderate pain (pain score 4-6). 10/22/23   [provider]  oxyCODONE -acetaminophen  (PERCOCET/ROXICET) 5-325 MG tablet Take 1 tablet by mouth every 6 (six) hours as needed. Patient not taking: Reported on 11/05/2023 10/01/23   Constant, Peggy, MD  pantoprazole  (PROTONIX ) 40 MG tablet Take 1 tablet (40 mg total) by mouth daily. 05/29/23   Setzer, Sandra J, PA-C  prednisoLONE acetate (PRED FORTE) 1 % ophthalmic suspension Place 1 drop into the right eye 4 (four) times daily. 10/24/23   [provider]  predniSONE  (DELTASONE ) 10 MG tablet Takes 3 tablets for 1 day, then 2 tabs for 1 day, then 1 tab for 1 day, and then stop. Patient not taking: Reported on 11/09/2023 10/31/23   Odell Celinda Balo, MD  Rimegepant Sulfate (NURTEC) 75 MG TBDP Take 1 tablet (75 mg  total) by mouth as needed. Take 1 tablet at onset of headache, max is 1 tablet in 24 hours. 08/20/23   Gayland Lauraine PARAS, NP  tiZANidine  (ZANAFLEX ) 4 MG tablet Take 4 mg by mouth every 12 (twelve) hours as needed for muscle spasms. 06/10/23   [provider]  vitamin C  (ASCORBIC ACID ) 250 MG tablet Take 2 tablets (500 mg total) by mouth daily. 04/10/22   Babara Call, MD    Family History Family History  Problem Relation Age of Onset   Diabetes Mother    Hypertension Mother    Stroke Mother     Social History Social History   Tobacco Use   Smoking status: Former    Types: Cigars    Quit date: 12/23/2021    Years since quitting: 1.8   Smokeless tobacco: Never   Tobacco comments:    2 cigars daily. Khj 02/12/2023  Vaping Use   Vaping status: Never Used  Substance Use Topics   Alcohol  use: Not Currently   Drug use: Not Currently    Comment: per patient stopped smoking marijuana Mar 2022     Allergies   Trulicity  [dulaglutide ]   Review of Systems Review of Systems  Constitutional:  Negative for chills and fever.  HENT:  Negative for ear pain and sore throat.   Eyes:  Negative for pain and visual disturbance.  Respiratory:  Positive for cough. Negative for shortness of breath.   Cardiovascular:  Positive for leg swelling. Negative for chest pain and palpitations.  Gastrointestinal:  Negative for abdominal pain and vomiting.  Genitourinary:  Negative for dysuria and hematuria.  Musculoskeletal:  Negative for arthralgias and back pain.  Skin:  Negative for color change and rash.  Neurological:  Negative for seizures and syncope.  All other systems reviewed and are negative.    Physical Exam Triage  Vital Signs ED Triage Vitals  Encounter Vitals Group     BP 11/09/23 1129 134/81     Girls Systolic BP Percentile --      Girls Diastolic BP Percentile --      Boys Systolic BP Percentile --      Boys Diastolic BP Percentile --      Pulse Rate 11/09/23 1129 97     Resp 11/09/23 1129 16     Temp 11/09/23 1129 98 F (36.7 C)     Temp Source 11/09/23 1129 Oral     SpO2 11/09/23 1129 99 %     Weight --      Height --      Head Circumference --      Peak Flow --      Pain Score 11/09/23 1131 8     Pain Loc --      Pain Education --      Exclude from Growth Chart --    No data found.  Updated Vital Signs BP 134/81 (BP Location: Left Arm)   Pulse 97   Temp 98 F (36.7 C) (Oral)   Resp 16   LMP 10/01/2023 (Approximate)   SpO2 99%   Visual Acuity Right Eye Distance:   Left Eye Distance:   Bilateral Distance:    Right Eye Near:   Left Eye Near:    Bilateral Near:     Physical Exam Vitals and nursing note reviewed.  Constitutional:      General: She is not in acute distress.    Appearance: She is well-developed.  HENT:     Head: Normocephalic and atraumatic.  Eyes:     Conjunctiva/sclera: Conjunctivae normal.  Cardiovascular:     Rate and Rhythm: Normal rate and regular rhythm.     Heart sounds: No murmur heard. Pulmonary:     Effort: Pulmonary effort is normal. No respiratory distress.     Breath sounds: Normal breath sounds.  Abdominal:     Palpations: Abdomen is soft.     Tenderness: There is no abdominal tenderness.  Musculoskeletal:        General: No swelling.     Cervical back: Neck supple.     Comments: Bilateral extremities with 1+ pitting edema.  Skin:    General: Skin is warm and dry.     Capillary Refill: Capillary refill takes less than 2 seconds.  Neurological:     Mental Status: She is alert.  Psychiatric:        Mood and Affect: Mood normal.      UC Treatments / Results  Labs (all labs ordered are listed, but only abnormal results are  displayed) Labs Reviewed - No data to display  EKG   Radiology No results found.  Procedures Procedures (including critical care time)  Medications Ordered in UC Medications - No data to display  Initial Impression / Assessment and Plan / UC Course  I have reviewed the triage vital signs and the nursing notes.  Pertinent labs & imaging results that were available during my care of the patient were reviewed by me and considered in my medical decision making (see chart for details).     Will send in Tessalon  for continued cough.  Lungs are clear to auscultation on exam.  Vitals within normal limits.  Patient overall well-appearing with no acute distress.  Patient is currently on Lasix , advised to continued.  Lower extremity edema is chronic no worsening in the last few days. Patient has an upcoming appointment with her primary care physician, advised to keep this appointment. ED precautions given. Final Clinical Impressions(s) / UC Diagnoses   Final diagnoses:  Cough, unspecified type  Lower extremity edema     Discharge Instructions      Can take Tessalon  as needed for cough. Continue to elevate your legs.  Wear compression socks daily.  Continue with Lasix  as prescribed. Make sure you keep your follow-up with your primary care physician later this week. If symptoms become worse or you have trouble with your breathing please return for evaluation   ED Prescriptions     Medication Sig Dispense Auth. Provider   benzonatate  (TESSALON ) 100 MG capsule Take 1 capsule (100 mg total) by mouth every 8 (eight) hours. 21 capsule Ward, Cyndee Giammarco Z, PA-C      PDMP not reviewed this encounter.   Ward, Harlene PEDLAR, PA-C 11/11/23 1920

## 2023-11-12 ENCOUNTER — Other Ambulatory Visit: Payer: Self-pay

## 2023-11-12 ENCOUNTER — Other Ambulatory Visit: Payer: Self-pay | Admitting: Oncology

## 2023-11-12 ENCOUNTER — Inpatient Hospital Stay

## 2023-11-12 VITALS — BP 140/79

## 2023-11-12 DIAGNOSIS — D519 Vitamin B12 deficiency anemia, unspecified: Secondary | ICD-10-CM

## 2023-11-12 DIAGNOSIS — D649 Anemia, unspecified: Secondary | ICD-10-CM

## 2023-11-12 DIAGNOSIS — Z23 Encounter for immunization: Secondary | ICD-10-CM

## 2023-11-12 DIAGNOSIS — R162 Hepatomegaly with splenomegaly, not elsewhere classified: Secondary | ICD-10-CM

## 2023-11-12 DIAGNOSIS — N1831 Chronic kidney disease, stage 3a: Secondary | ICD-10-CM | POA: Diagnosis not present

## 2023-11-12 LAB — PREPARE RBC (CROSSMATCH)

## 2023-11-12 LAB — HEMOGLOBIN AND HEMATOCRIT (CANCER CENTER ONLY)
HCT: 21.7 % — ABNORMAL LOW (ref 36.0–46.0)
Hemoglobin: 7.4 g/dL — ABNORMAL LOW (ref 12.0–15.0)

## 2023-11-12 MED ORDER — EPOETIN ALFA-EPBX 40000 UNIT/ML IJ SOLN
40000.0000 [IU] | Freq: Once | INTRAMUSCULAR | Status: AC
  Administered 2023-11-12: 40000 [IU] via SUBCUTANEOUS
  Filled 2023-11-12: qty 1

## 2023-11-13 ENCOUNTER — Inpatient Hospital Stay

## 2023-11-13 ENCOUNTER — Other Ambulatory Visit: Payer: Self-pay | Admitting: Cardiology

## 2023-11-13 ENCOUNTER — Ambulatory Visit

## 2023-11-13 DIAGNOSIS — R0602 Shortness of breath: Secondary | ICD-10-CM

## 2023-11-13 DIAGNOSIS — I10 Essential (primary) hypertension: Secondary | ICD-10-CM

## 2023-11-13 DIAGNOSIS — M7989 Other specified soft tissue disorders: Secondary | ICD-10-CM

## 2023-11-13 DIAGNOSIS — N1831 Chronic kidney disease, stage 3a: Secondary | ICD-10-CM | POA: Diagnosis not present

## 2023-11-13 DIAGNOSIS — D649 Anemia, unspecified: Secondary | ICD-10-CM

## 2023-11-13 MED ORDER — DIPHENHYDRAMINE HCL 25 MG PO CAPS
25.0000 mg | ORAL_CAPSULE | Freq: Once | ORAL | Status: AC
  Administered 2023-11-13: 25 mg via ORAL
  Filled 2023-11-13: qty 1

## 2023-11-13 MED ORDER — ACETAMINOPHEN 325 MG PO TABS
650.0000 mg | ORAL_TABLET | Freq: Once | ORAL | Status: AC
  Administered 2023-11-13: 650 mg via ORAL
  Filled 2023-11-13: qty 2

## 2023-11-13 MED ORDER — SODIUM CHLORIDE 0.9% IV SOLUTION
250.0000 mL | INTRAVENOUS | Status: DC
  Administered 2023-11-13: 100 mL via INTRAVENOUS
  Filled 2023-11-13: qty 250

## 2023-11-14 LAB — TYPE AND SCREEN
ABO/RH(D): A POS
Antibody Screen: NEGATIVE
Unit division: 0

## 2023-11-14 LAB — BPAM RBC
Blood Product Expiration Date: 202509212359
ISSUE DATE / TIME: 202508271321
Unit Type and Rh: 202509212359
Unit Type and Rh: 6200

## 2023-11-19 ENCOUNTER — Ambulatory Visit

## 2023-11-19 ENCOUNTER — Other Ambulatory Visit

## 2023-11-20 ENCOUNTER — Encounter

## 2023-11-21 ENCOUNTER — Telehealth: Payer: Self-pay | Admitting: Oncology

## 2023-11-21 ENCOUNTER — Inpatient Hospital Stay: Attending: Oncology

## 2023-11-21 ENCOUNTER — Inpatient Hospital Stay

## 2023-11-21 DIAGNOSIS — N1831 Chronic kidney disease, stage 3a: Secondary | ICD-10-CM | POA: Insufficient documentation

## 2023-11-21 DIAGNOSIS — R591 Generalized enlarged lymph nodes: Secondary | ICD-10-CM | POA: Insufficient documentation

## 2023-11-21 DIAGNOSIS — E538 Deficiency of other specified B group vitamins: Secondary | ICD-10-CM | POA: Insufficient documentation

## 2023-11-21 DIAGNOSIS — R162 Hepatomegaly with splenomegaly, not elsewhere classified: Secondary | ICD-10-CM | POA: Insufficient documentation

## 2023-11-21 DIAGNOSIS — D631 Anemia in chronic kidney disease: Secondary | ICD-10-CM | POA: Insufficient documentation

## 2023-11-21 DIAGNOSIS — R700 Elevated erythrocyte sedimentation rate: Secondary | ICD-10-CM | POA: Insufficient documentation

## 2023-11-21 NOTE — Telephone Encounter (Signed)
 Pt no showed labs today 9/4. Was scheduled for blood tomorrow 9/5.  Due to not having labs, the blood appt has been canceled.   I called and left a vm for pt stating this and that we can r/s appts if she wants to. Scheduling phone number given.

## 2023-11-22 ENCOUNTER — Inpatient Hospital Stay

## 2023-11-23 ENCOUNTER — Emergency Department (HOSPITAL_COMMUNITY)

## 2023-11-23 ENCOUNTER — Encounter (HOSPITAL_COMMUNITY): Payer: Self-pay | Admitting: *Deleted

## 2023-11-23 ENCOUNTER — Other Ambulatory Visit: Payer: Self-pay

## 2023-11-23 ENCOUNTER — Observation Stay (HOSPITAL_COMMUNITY)
Admission: EM | Admit: 2023-11-23 | Discharge: 2023-11-25 | Disposition: A | Discharge status: Home / Self Care | Attending: Internal Medicine | Admitting: Internal Medicine

## 2023-11-23 DIAGNOSIS — N920 Excessive and frequent menstruation with regular cycle: Secondary | ICD-10-CM | POA: Diagnosis not present

## 2023-11-23 DIAGNOSIS — Z6831 Body mass index (BMI) 31.0-31.9, adult: Secondary | ICD-10-CM | POA: Insufficient documentation

## 2023-11-23 DIAGNOSIS — I129 Hypertensive chronic kidney disease with stage 1 through stage 4 chronic kidney disease, or unspecified chronic kidney disease: Secondary | ICD-10-CM | POA: Insufficient documentation

## 2023-11-23 DIAGNOSIS — Z794 Long term (current) use of insulin: Secondary | ICD-10-CM | POA: Diagnosis not present

## 2023-11-23 DIAGNOSIS — D62 Acute posthemorrhagic anemia: Principal | ICD-10-CM | POA: Insufficient documentation

## 2023-11-23 DIAGNOSIS — R0602 Shortness of breath: Secondary | ICD-10-CM

## 2023-11-23 DIAGNOSIS — J069 Acute upper respiratory infection, unspecified: Secondary | ICD-10-CM | POA: Diagnosis not present

## 2023-11-23 DIAGNOSIS — K219 Gastro-esophageal reflux disease without esophagitis: Secondary | ICD-10-CM | POA: Insufficient documentation

## 2023-11-23 DIAGNOSIS — E1122 Type 2 diabetes mellitus with diabetic chronic kidney disease: Secondary | ICD-10-CM | POA: Insufficient documentation

## 2023-11-23 DIAGNOSIS — E66811 Obesity, class 1: Secondary | ICD-10-CM | POA: Diagnosis not present

## 2023-11-23 DIAGNOSIS — J4520 Mild intermittent asthma, uncomplicated: Secondary | ICD-10-CM | POA: Diagnosis present

## 2023-11-23 DIAGNOSIS — N179 Acute kidney failure, unspecified: Secondary | ICD-10-CM | POA: Insufficient documentation

## 2023-11-23 DIAGNOSIS — D649 Anemia, unspecified: Principal | ICD-10-CM | POA: Diagnosis present

## 2023-11-23 DIAGNOSIS — E538 Deficiency of other specified B group vitamins: Secondary | ICD-10-CM | POA: Diagnosis present

## 2023-11-23 DIAGNOSIS — M7989 Other specified soft tissue disorders: Secondary | ICD-10-CM

## 2023-11-23 DIAGNOSIS — D519 Vitamin B12 deficiency anemia, unspecified: Secondary | ICD-10-CM | POA: Insufficient documentation

## 2023-11-23 DIAGNOSIS — I10 Essential (primary) hypertension: Secondary | ICD-10-CM | POA: Diagnosis not present

## 2023-11-23 DIAGNOSIS — D696 Thrombocytopenia, unspecified: Secondary | ICD-10-CM | POA: Insufficient documentation

## 2023-11-23 DIAGNOSIS — E875 Hyperkalemia: Secondary | ICD-10-CM | POA: Insufficient documentation

## 2023-11-23 DIAGNOSIS — N1831 Chronic kidney disease, stage 3a: Secondary | ICD-10-CM | POA: Insufficient documentation

## 2023-11-23 DIAGNOSIS — Z79899 Other long term (current) drug therapy: Secondary | ICD-10-CM | POA: Insufficient documentation

## 2023-11-23 DIAGNOSIS — N924 Excessive bleeding in the premenopausal period: Secondary | ICD-10-CM

## 2023-11-23 DIAGNOSIS — B9789 Other viral agents as the cause of diseases classified elsewhere: Secondary | ICD-10-CM | POA: Insufficient documentation

## 2023-11-23 DIAGNOSIS — R519 Headache, unspecified: Secondary | ICD-10-CM | POA: Diagnosis present

## 2023-11-23 DIAGNOSIS — D509 Iron deficiency anemia, unspecified: Secondary | ICD-10-CM | POA: Insufficient documentation

## 2023-11-23 DIAGNOSIS — E1165 Type 2 diabetes mellitus with hyperglycemia: Secondary | ICD-10-CM | POA: Diagnosis not present

## 2023-11-23 DIAGNOSIS — K2100 Gastro-esophageal reflux disease with esophagitis, without bleeding: Secondary | ICD-10-CM | POA: Insufficient documentation

## 2023-11-23 DIAGNOSIS — B348 Other viral infections of unspecified site: Secondary | ICD-10-CM

## 2023-11-23 DIAGNOSIS — B349 Viral infection, unspecified: Secondary | ICD-10-CM

## 2023-11-23 LAB — COMPREHENSIVE METABOLIC PANEL WITH GFR
ALT: 9 U/L (ref 0–44)
AST: 15 U/L (ref 15–41)
Albumin: 2.5 g/dL — ABNORMAL LOW (ref 3.5–5.0)
Alkaline Phosphatase: 88 U/L (ref 38–126)
Anion gap: 8 (ref 5–15)
BUN: 29 mg/dL — ABNORMAL HIGH (ref 6–20)
CO2: 18 mmol/L — ABNORMAL LOW (ref 22–32)
Calcium: 8 mg/dL — ABNORMAL LOW (ref 8.9–10.3)
Chloride: 110 mmol/L (ref 98–111)
Creatinine, Ser: 1.81 mg/dL — ABNORMAL HIGH (ref 0.44–1.00)
GFR, Estimated: 36 mL/min — ABNORMAL LOW (ref >60)
Glucose, Bld: 186 mg/dL — ABNORMAL HIGH (ref 70–99)
Potassium: 5.1 mmol/L (ref 3.5–5.1)
Sodium: 136 mmol/L (ref 135–145)
Total Bilirubin: 1 mg/dL (ref 0.0–1.2)
Total Protein: 5.3 g/dL — ABNORMAL LOW (ref 6.5–8.1)

## 2023-11-23 LAB — URINALYSIS, MICROSCOPIC (REFLEX)

## 2023-11-23 LAB — SARS CORONAVIRUS 2 BY RT PCR: SARS Coronavirus 2 by RT PCR: NEGATIVE

## 2023-11-23 LAB — CBC WITH DIFFERENTIAL/PLATELET
Abs Immature Granulocytes: 0.03 K/uL (ref 0.00–0.07)
Basophils Absolute: 0.1 K/uL (ref 0.0–0.1)
Basophils Relative: 1 %
Eosinophils Absolute: 0.1 K/uL (ref 0.0–0.5)
Eosinophils Relative: 1 %
HCT: 20.8 % — ABNORMAL LOW (ref 36.0–46.0)
Hemoglobin: 6.8 g/dL — CL (ref 12.0–15.0)
Immature Granulocytes: 0 %
Lymphocytes Relative: 19 %
Lymphs Abs: 1.3 K/uL (ref 0.7–4.0)
MCH: 27.6 pg (ref 26.0–34.0)
MCHC: 32.7 g/dL (ref 30.0–36.0)
MCV: 84.6 fL (ref 80.0–100.0)
Monocytes Absolute: 0.7 K/uL (ref 0.1–1.0)
Monocytes Relative: 9 %
Neutro Abs: 4.7 K/uL (ref 1.7–7.7)
Neutrophils Relative %: 70 %
Platelets: 127 K/uL — ABNORMAL LOW (ref 150–400)
RBC: 2.46 MIL/uL — ABNORMAL LOW (ref 3.87–5.11)
RDW: 17.9 % — ABNORMAL HIGH (ref 11.5–15.5)
Smear Review: NORMAL
WBC: 6.9 K/uL (ref 4.0–10.5)
nRBC: 0 % (ref 0.0–0.2)

## 2023-11-23 LAB — URINALYSIS, ROUTINE W REFLEX MICROSCOPIC
Bilirubin Urine: NEGATIVE
Glucose, UA: 250 mg/dL — AB
Ketones, ur: NEGATIVE mg/dL
Leukocytes,Ua: NEGATIVE
Nitrite: NEGATIVE
Protein, ur: >300 mg/dL — AB
Specific Gravity, Urine: 1.025 (ref 1.005–1.030)
pH: 6 (ref 5.0–8.0)

## 2023-11-23 LAB — GLUCOSE, CAPILLARY
Glucose-Capillary: 207 mg/dL — ABNORMAL HIGH (ref 70–99)
Glucose-Capillary: 299 mg/dL — ABNORMAL HIGH (ref 70–99)
Glucose-Capillary: 340 mg/dL — ABNORMAL HIGH (ref 70–99)
Glucose-Capillary: 358 mg/dL — ABNORMAL HIGH (ref 70–99)

## 2023-11-23 LAB — LIPASE, BLOOD: Lipase: 29 U/L (ref 11–51)

## 2023-11-23 LAB — PREPARE RBC (CROSSMATCH)

## 2023-11-23 LAB — HCG, SERUM, QUALITATIVE: Preg, Serum: NEGATIVE

## 2023-11-23 MED ORDER — GABAPENTIN 400 MG PO CAPS
800.0000 mg | ORAL_CAPSULE | Freq: Three times a day (TID) | ORAL | Status: DC
  Administered 2023-11-23 – 2023-11-25 (×6): 800 mg via ORAL
  Filled 2023-11-23 (×6): qty 2

## 2023-11-23 MED ORDER — SODIUM CHLORIDE 0.9% IV SOLUTION: Freq: Once | INTRAVENOUS | Status: AC

## 2023-11-23 MED ORDER — LOSARTAN POTASSIUM 25 MG PO TABS
25.0000 mg | ORAL_TABLET | Freq: Every day | ORAL | Status: DC
  Administered 2023-11-23: 25 mg via ORAL
  Filled 2023-11-23: qty 1

## 2023-11-23 MED ORDER — ACETAMINOPHEN 500 MG PO TABS
1000.0000 mg | ORAL_TABLET | Freq: Four times a day (QID) | ORAL | Status: DC | PRN
  Administered 2023-11-23 – 2023-11-25 (×3): 1000 mg via ORAL
  Filled 2023-11-23 (×3): qty 2

## 2023-11-23 MED ORDER — ONDANSETRON HCL 4 MG PO TABS: 4.0000 mg | ORAL_TABLET | Freq: Four times a day (QID) | ORAL | Status: DC | PRN

## 2023-11-23 MED ORDER — FLUTICASONE FUROATE-VILANTEROL 100-25 MCG/ACT IN AEPB
1.0000 | INHALATION_SPRAY | Freq: Every day | RESPIRATORY_TRACT | Status: DC
  Administered 2023-11-23 – 2023-11-25 (×3): 1 via RESPIRATORY_TRACT
  Filled 2023-11-23: qty 28

## 2023-11-23 MED ORDER — INSULIN GLARGINE 100 UNIT/ML ~~LOC~~ SOLN
24.0000 [IU] | SUBCUTANEOUS | Status: DC
  Administered 2023-11-23 – 2023-11-24 (×2): 24 [IU] via SUBCUTANEOUS
  Filled 2023-11-23 (×3): qty 0.24

## 2023-11-23 MED ORDER — METOPROLOL TARTRATE 25 MG PO TABS
25.0000 mg | ORAL_TABLET | Freq: Two times a day (BID) | ORAL | Status: DC
  Administered 2023-11-23 – 2023-11-25 (×4): 25 mg via ORAL
  Filled 2023-11-23 (×4): qty 1

## 2023-11-23 MED ORDER — VITAMIN C 500 MG PO TABS
500.0000 mg | ORAL_TABLET | Freq: Every day | ORAL | Status: DC
  Administered 2023-11-23 – 2023-11-25 (×3): 500 mg via ORAL
  Filled 2023-11-23 (×3): qty 1

## 2023-11-23 MED ORDER — SODIUM CHLORIDE 0.9 % IV BOLUS
500.0000 mL | Freq: Once | INTRAVENOUS | Status: AC
  Administered 2023-11-23: 500 mL via INTRAVENOUS

## 2023-11-23 MED ORDER — FLUTICASONE PROPIONATE 50 MCG/ACT NA SUSP
2.0000 | Freq: Every day | NASAL | Status: DC
  Administered 2023-11-23 – 2023-11-25 (×3): 2 via NASAL
  Filled 2023-11-23: qty 16

## 2023-11-23 MED ORDER — ONDANSETRON HCL 4 MG/2ML IJ SOLN: 4.0000 mg | Freq: Four times a day (QID) | INTRAMUSCULAR | Status: DC | PRN

## 2023-11-23 MED ORDER — AMITRIPTYLINE HCL 10 MG PO TABS
10.0000 mg | ORAL_TABLET | Freq: Every day | ORAL | Status: DC
  Administered 2023-11-23 – 2023-11-24 (×2): 10 mg via ORAL
  Filled 2023-11-23 (×3): qty 1

## 2023-11-23 MED ORDER — ALBUTEROL SULFATE (2.5 MG/3ML) 0.083% IN NEBU: 2.5000 mg | INHALATION_SOLUTION | Freq: Four times a day (QID) | RESPIRATORY_TRACT | Status: DC | PRN

## 2023-11-23 MED ORDER — GABAPENTIN 800 MG PO TABS
800.0000 mg | ORAL_TABLET | Freq: Three times a day (TID) | ORAL | Status: DC
  Filled 2023-11-23 (×2): qty 1

## 2023-11-23 MED ORDER — ADULT MULTIVITAMIN W/MINERALS CH
1.0000 | ORAL_TABLET | Freq: Every day | ORAL | Status: DC
  Administered 2023-11-23 – 2023-11-25 (×3): 1 via ORAL
  Filled 2023-11-23 (×3): qty 1

## 2023-11-23 MED ORDER — INSULIN ASPART 100 UNIT/ML IJ SOLN
0.0000 [IU] | Freq: Three times a day (TID) | INTRAMUSCULAR | Status: DC
  Administered 2023-11-23 – 2023-11-24 (×2): 8 [IU] via SUBCUTANEOUS
  Administered 2023-11-24 (×2): 11 [IU] via SUBCUTANEOUS
  Administered 2023-11-25: 8 [IU] via SUBCUTANEOUS
  Administered 2023-11-25: 15 [IU] via SUBCUTANEOUS

## 2023-11-23 MED ORDER — KETOROLAC TROMETHAMINE 0.5 % OP SOLN
1.0000 [drp] | Freq: Four times a day (QID) | OPHTHALMIC | Status: DC
  Administered 2023-11-23 (×3): 1 [drp] via OPHTHALMIC
  Filled 2023-11-23: qty 5

## 2023-11-23 MED ORDER — TIZANIDINE HCL 2 MG PO TABS
4.0000 mg | ORAL_TABLET | Freq: Two times a day (BID) | ORAL | Status: DC | PRN
  Administered 2023-11-24 (×2): 4 mg via ORAL
  Filled 2023-11-23 (×3): qty 2

## 2023-11-23 MED ORDER — BENZONATATE 100 MG PO CAPS
100.0000 mg | ORAL_CAPSULE | Freq: Three times a day (TID) | ORAL | Status: DC
  Administered 2023-11-23 – 2023-11-25 (×6): 100 mg via ORAL
  Filled 2023-11-23 (×6): qty 1

## 2023-11-23 MED ORDER — KETOROLAC TROMETHAMINE 15 MG/ML IJ SOLN
15.0000 mg | Freq: Once | INTRAMUSCULAR | Status: AC
  Administered 2023-11-23: 15 mg via INTRAVENOUS
  Filled 2023-11-23: qty 1

## 2023-11-23 MED ORDER — PREDNISOLONE ACETATE 1 % OP SUSP
1.0000 [drp] | Freq: Four times a day (QID) | OPHTHALMIC | Status: DC
  Administered 2023-11-23 (×3): 1 [drp] via OPHTHALMIC
  Filled 2023-11-23: qty 5

## 2023-11-23 MED ORDER — ALBUTEROL SULFATE HFA 108 (90 BASE) MCG/ACT IN AERS: 2.0000 | INHALATION_SPRAY | Freq: Four times a day (QID) | RESPIRATORY_TRACT | Status: DC | PRN

## 2023-11-23 MED ORDER — PANTOPRAZOLE SODIUM 40 MG PO TBEC
40.0000 mg | DELAYED_RELEASE_TABLET | Freq: Every day | ORAL | Status: DC
  Administered 2023-11-23 – 2023-11-25 (×3): 40 mg via ORAL
  Filled 2023-11-23 (×3): qty 1

## 2023-11-23 MED ORDER — INSULIN GLARGINE-YFGN 100 UNIT/ML ~~LOC~~ SOPN: 10.0000 [IU] | PEN_INJECTOR | SUBCUTANEOUS | Status: DC

## 2023-11-23 MED ORDER — OFLOXACIN 0.3 % OP SOLN
1.0000 [drp] | Freq: Four times a day (QID) | OPHTHALMIC | Status: DC
  Administered 2023-11-23 (×3): 1 [drp] via OPHTHALMIC
  Filled 2023-11-23: qty 5

## 2023-11-23 NOTE — Assessment & Plan Note (Signed)
 Had continue supplementation as outpatient

## 2023-11-23 NOTE — Assessment & Plan Note (Signed)
Calculated BMI is 31.7

## 2023-11-23 NOTE — H&P (Addendum)
 History and Physical    Patient: Melinda Stone FMW:[id number] DOB: Apr 22, 1980 DOA: 11/23/2023 DOS: the patient was seen and examined on 11/23/2023 PCP: [name], NP  Patient coming from: Home  Chief Complaint:  Chief Complaint  Patient presents with   Headache    Generalize bodyaches   HPI: Melinda Stone is a 43 y.o. female with medical history significant of anemia who presented with fatigue and dyspnea on exertion. Recent hospitalization 08/11 to 10/31/23 for sepsis due to sever sinusitis, she had low hgb and required PRBC transfusion with a discharge hgb of 8.   11/09/23 urgent care visit due to cough and edema.  Patient reports 10 days, of worsening and progressive dyspnea on exertion, easy fatigability and generalized weakness. No frank chest pain, edema, PND or orthopnea. For the last 3 days she has been experiencing generalized malaise, cough and congestion. Positive sick contacts at home. Over last 24 hrs she started having heavy menstrual period with no significant pelvic pain, fever or chills.   She had history of heavy menstrual periods with chronic anemia, requiring endometrial ablation in July 2025, since then she had no bleeding until yesterday.  Apparently she is getting weekly cell counts checks and required one unit of PRBC transfusion on 11/13/23.  She follows with hematology for chronic anemia refractory to PRBC transfusion, she has been treated with IV iron , EPO and vitamin 12 supplements.   Review of Systems: As mentioned in the history of present illness. All other systems reviewed and are negative. Past Medical History:  Diagnosis Date   Anemia    Arthritis 08/17/2020   Chronic back pain    Diabetes mellitus without complication (HCC)    type 2   GERD (gastroesophageal reflux disease)    Hypertension    Migraine    Ovarian cyst    Pneumonia    Polyneuropathy    Past Surgical History:  Procedure Laterality Date   CESAREAN SECTION      CHOLECYSTECTOMY  2004   EYE SURGERY Left    cataract   HYSTEROSCOPY N/A 10/01/2023   Procedure: ABLATION, ENDOMETRIUM, HYSTEROSCOPIC;  Surgeon: Alger Gong, MD;  Location: [facility] OR;  Service: Gynecology;  Laterality: N/A;   IR BONE MARROW BIOPSY & ASPIRATION  08/01/2023   LUMBAR LAMINECTOMY N/A 05/30/2020   Procedure: Lumbar five Laminectomy,  Bilateral Microdiscectomy, Left Lumbar five -Sacral one Microdiscectomy;  Surgeon: Barbarann Oneil BROCKS, MD;  Location: [facility] OR;  Service: Orthopedics;  Laterality: N/A;   LUMBAR LAMINECTOMY Left 11/14/2020   Procedure: LEFT LUMBAR FOUR-FIVE MICRODISCECTOMY;  Surgeon: Barbarann Oneil BROCKS, MD;  Location: [facility] OR;  Service: Orthopedics;  Laterality: Left;   TUBAL LIGATION  10/19/2010   Social History:  reports that she quit smoking about 23 months ago. Her smoking use included cigars. She has never used smokeless tobacco. She reports that she does not currently use alcohol . She reports that she does not currently use drugs.  Allergies  Allergen Reactions   Trulicity  [Dulaglutide ] Diarrhea    Family History  Problem Relation Age of Onset   Diabetes Mother    Hypertension Mother    Stroke Mother     Prior to Admission medications   Medication Sig Start Date End Date Taking? Authorizing Provider  acetaminophen  (TYLENOL ) 500 MG tablet Take 1,000 mg by mouth every 6 (six) hours as needed for mild pain (pain score 1-3) or moderate pain (pain score 4-6).    [provider]  albuterol  (VENTOLIN  HFA) 108 (90 Base) MCG/ACT  inhaler Inhale 2 puffs into the lungs every 6 (six) hours as needed for wheezing or shortness of breath. 05/29/23   Setzer, Nena PARAS, PA-C  amitriptyline  (ELAVIL ) 10 MG tablet Take 10 mg by mouth at bedtime. 10/22/23   [provider]  benzonatate  (TESSALON ) 100 MG capsule Take 1 capsule (100 mg total) by mouth every 8 (eight) hours. 11/09/23   Ward, Harlene PEDLAR, PA-C  Continuous Glucose Sensor (DEXCOM G7 SENSOR) MISC 1 Device by Does not  apply route as directed. Change sensor every 10 days Patient taking differently: Inject 1 Device into the skin See admin instructions. Change sensor every 10 days 06/21/23   Shamleffer, Ibtehal Jaralla, MD  Cyanocobalamin  (VITAMIN B-12) 2500 MCG SUBL Place 1 tablet (2,500 mcg total) under the tongue daily. 04/23/23   Babara Call, MD  ferrous sulfate  325 (65 FE) MG tablet Take 1 tablet (325 mg total) by mouth daily with breakfast. 05/29/23   Setzer, Sandra J, PA-C  fluticasone  (FLONASE ) 50 MCG/ACT nasal spray Place 2 sprays into both nostrils daily. 10/31/23   Odell Celinda Balo, MD  furosemide  (LASIX ) 20 MG tablet TAKE 1 TABLET(20 MG) BY MOUTH IN THE MORNING 11/14/23   Tolia, Sunit, DO  gabapentin  (NEURONTIN ) 800 MG tablet Take 800 mg by mouth 3 (three) times daily.    [provider]  Galcanezumab -gnlm (EMGALITY ) 120 MG/ML SOAJ Inject 120 mg into the skin every 30 (thirty) days. 08/20/23   Gayland Lauraine PARAS, NP  ibuprofen  (ADVIL ) 600 MG tablet Take 1 tablet (600 mg total) by mouth every 6 (six) hours as needed. Patient not taking: Reported on 11/09/2023 10/01/23   Constant, Peggy, MD  Insulin  Disposable Pump (OMNIPOD 5 G7 INTRO, GEN 5,) KIT 1 Device by Does not apply route every other day. 09/26/23   Shamleffer, Ibtehal Jaralla, MD  Insulin  Disposable Pump (OMNIPOD 5 G7 PODS, GEN 5,) MISC 1 Device by Does not apply route every other day. 09/26/23   Shamleffer, Ibtehal Jaralla, MD  insulin  glargine (LANTUS  SOLOSTAR) 100 UNIT/ML Solostar Pen Inject 45 Units into the skin daily before breakfast. 09/26/23   Shamleffer, Ibtehal Jaralla, MD  insulin  lispro (HUMALOG  KWIKPEN) 100 UNIT/ML KwikPen Inject 12-22 Units into the skin 3 (three) times daily. Max daily 09/26/23   Shamleffer, Donell Cardinal, MD  Insulin  Pen Needle 32G X 4 MM MISC 1 Device by Does not apply route in the morning, at noon, in the evening, and at bedtime. 09/26/23   Shamleffer, Ibtehal Jaralla, MD  ketorolac  (ACULAR ) 0.5 % ophthalmic solution Place  1 drop into the right eye 4 (four) times daily. 10/24/23   [provider]  losartan  (COZAAR ) 25 MG tablet TAKE 1 TABLET(25 MG) BY MOUTH DAILY 11/14/23   Tolia, Sunit, DO  metoprolol  tartrate (LOPRESSOR ) 25 MG tablet Take 1 tablet (25 mg total) by mouth 2 (two) times daily. 05/29/23 11/05/23  Setzer, Nena PARAS, PA-C  metroNIDAZOLE  (FLAGYL ) 500 MG tablet Take 1 tablet (500 mg total) by mouth 2 (two) times daily. 11/05/23   Constant, Peggy, MD  mometasone -formoterol  (DULERA ) 100-5 MCG/ACT AERO Inhale 2 puffs into the lungs 2 (two) times daily at 10 AM and 5 PM. 05/29/23   Setzer, Sandra J, PA-C  Multiple Vitamin (MULTIVITAMIN WITH MINERALS) TABS tablet Take 1 tablet by mouth daily. 05/29/23   Setzer, Sandra J, PA-C  ofloxacin  (OCUFLOX ) 0.3 % ophthalmic solution Place 1 drop into the right eye 4 (four) times daily. 10/24/23   [provider]  oxyCODONE -acetaminophen  (PERCOCET) 7.5-325 MG tablet  Take 1 tablet by mouth 4 (four) times daily as needed for moderate pain (pain score 4-6). 10/22/23   [provider]  oxyCODONE -acetaminophen  (PERCOCET/ROXICET) 5-325 MG tablet Take 1 tablet by mouth every 6 (six) hours as needed. Patient not taking: Reported on 11/05/2023 10/01/23   Constant, Peggy, MD  pantoprazole  (PROTONIX ) 40 MG tablet Take 1 tablet (40 mg total) by mouth daily. 05/29/23   Setzer, Sandra J, PA-C  prednisoLONE  acetate (PRED FORTE ) 1 % ophthalmic suspension Place 1 drop into the right eye 4 (four) times daily. 10/24/23   [provider]  predniSONE  (DELTASONE ) 10 MG tablet Takes 3 tablets for 1 day, then 2 tabs for 1 day, then 1 tab for 1 day, and then stop. Patient not taking: Reported on 11/09/2023 10/31/23   Odell Celinda Balo, MD  Rimegepant Sulfate (NURTEC) 75 MG TBDP Take 1 tablet (75 mg total) by mouth as needed. Take 1 tablet at onset of headache, max is 1 tablet in 24 hours. 08/20/23   Gayland Lauraine PARAS, NP  tiZANidine  (ZANAFLEX ) 4 MG tablet Take 4 mg by mouth every 12  (twelve) hours as needed for muscle spasms. 06/10/23   [provider]  vitamin C  (ASCORBIC ACID ) 250 MG tablet Take 2 tablets (500 mg total) by mouth daily. 04/10/22   Babara Call, MD    Physical Exam: Vitals:   11/23/23 0835 11/23/23 1119 11/23/23 1120 11/23/23 1122  BP:    110/64  Pulse:  83 82 84  Resp:      Temp:  97.8 F (36.6 C) 97.8 F (36.6 C) 97.8 F (36.6 C)  TempSrc:  Oral Oral Oral  SpO2:   100%   Weight: 97.5 kg     Height: 5' 9 (1.753 m)      BP 138/82 (BP Location: Left Arm)   Pulse 86   Temp 98.1 F (36.7 C) (Oral)   Resp 18   Ht 5' 9 (1.753 m)   Wt 97.5 kg   LMP 10/01/2023 (Approximate)   SpO2 100%   BMI 31.75 kg/m   Neurology awake and alert ENT with mild pallor with no icterus Cardiovascular with S1 and S2 present and regular with no gallops, rubs or murmurs No JVD Respiratory with no rales or wheezing, no rhonchi,  Abdomen protuberant, non tender and not distended Trace lower extremity edema.  Data Reviewed:   Na 136, K 5.1 Cl 110 bicarbonate 18 glucose 186, bun 29 cr 1,81 AST 15 ALT 9  Wbc 6.9 hgb 6.8 plt 127  Sars COVID 19 negative  Urine analysis SG 1,025, protein > 300, negative leukocytes, large hgb, glucose 250,   Chest radiograph with hypoinflation, with positive cardiomegaly, with no infiltrates, or effusions.   EKG 82 bpm, right axis deviation, normal intervals, qtc 475, sinus rhythm with no significant ST segment or T wave changes.   Assessment and Plan: * Anemia Symptomatic anemia, multifactorial, including menorrhagia (acute blood loss) and iron  deficiency.  She has work up as outpatient including bone marrow biopsy, 07/2023 with no significant changes.   Plan for PRBC transfusion x2.  Follow up up on iron  levels.  Follow up cell count in am.   Menorrhagia Will need outpatient follow up with GYN, she had uterine ablation in July 2025, She has on megace  in the past.   Essential hypertension Continue blood pressure  control with losartan  and metoprolol .   Type 2 diabetes mellitus with hyperglycemia, without long-term current use of insulin  (HCC) Resume insulin  therapy  with sliding scale and basal.  Patient has been off her insulin  pump since yesterday. If persistent  hyperglycemia will resume insulin  pump.   Low serum vitamin B12 Had continue supplementation as outpatient   Esophageal reflux Continue pantoprazole .   Acute viral syndrome Check respiratory viral panel, positive sick contact at home.   Mild intermittent asthma No signs of exacerbation.  Continue with LABA and ICS  Obesity, class 1 Calculated BMI is 31.7       Advance Care Planning:   Code Status: Full Code   Consults: none   Family Communication: no family at the bedside   Severity of Illness: The appropriate patient status for this patient is OBSERVATION. Observation status is judged to be reasonable and necessary in order to provide the required intensity of service to ensure the patient's safety. The patient's presenting symptoms, physical exam findings, and initial radiographic and laboratory data in the context of their medical condition is felt to place them at decreased risk for further clinical deterioration. Furthermore, it is anticipated that the patient will be medically stable for discharge from the hospital within 2 midnights of admission.   Author: Elidia Toribio Furnace, MD 11/23/2023 11:26 AM  For on call review [email or web address].

## 2023-11-23 NOTE — ED Triage Notes (Signed)
 C/o generalized  bodyaches and states she is having heavy vag bleeding onset yest , LMP 06/27 states she took a preg test and it was neg.

## 2023-11-23 NOTE — Assessment & Plan Note (Signed)
 Will need outpatient follow up with GYN, she had uterine ablation in July 2025, She has on megace  in the past.

## 2023-11-23 NOTE — Plan of Care (Signed)

## 2023-11-23 NOTE — Assessment & Plan Note (Signed)
 Patient tested positive for Rhinovirus,  Continue supportive medical therapy, her symptoms are improving.

## 2023-11-23 NOTE — Assessment & Plan Note (Signed)
 Resume insulin  therapy with sliding scale and basal.  Patient has been off her insulin  pump since yesterday. If persistent  hyperglycemia will resume insulin  pump.

## 2023-11-23 NOTE — ED Provider Notes (Signed)
 [place] EMERGENCY DEPARTMENT AT [facility] Provider Note   CSN: [id number] Arrival date & time: 11/23/23  9178     Patient presents with: Headache (Generalize bodyaches)   Melinda Stone is a 43 y.o. female.    Headache Patient presents with various complaints.  Body aches heavy vaginal bleeding.  Began yesterday.  Has had previous uterine ablation in July.  Last menses was reportedly in June.  Has had anemia due to B12 deficiency.  Has had previous transfusion because of it.  Also has had a cough.  No real sputum production.  No fevers.  Has no sore throat.  Did have negative pregnancy test a couple weeks ago due to eye surgery.      Past Medical History:  Diagnosis Date   Anemia    Arthritis 08/17/2020   Chronic back pain    Diabetes mellitus without complication (HCC)    type 2   GERD (gastroesophageal reflux disease)    Hypertension    Migraine    Ovarian cyst    Pneumonia    Polyneuropathy    Past Surgical History:  Procedure Laterality Date   CESAREAN SECTION     CHOLECYSTECTOMY  2004   EYE SURGERY Left    cataract   HYSTEROSCOPY N/A 10/01/2023   Procedure: ABLATION, ENDOMETRIUM, HYSTEROSCOPIC;  Surgeon: Alger Gong, MD;  Location: [facility] OR;  Service: Gynecology;  Laterality: N/A;   IR BONE MARROW BIOPSY & ASPIRATION  08/01/2023   LUMBAR LAMINECTOMY N/A 05/30/2020   Procedure: Lumbar five Laminectomy,  Bilateral Microdiscectomy, Left Lumbar five -Sacral one Microdiscectomy;  Surgeon: Barbarann Oneil BROCKS, MD;  Location: [facility] OR;  Service: Orthopedics;  Laterality: N/A;   LUMBAR LAMINECTOMY Left 11/14/2020   Procedure: LEFT LUMBAR FOUR-FIVE MICRODISCECTOMY;  Surgeon: Barbarann Oneil BROCKS, MD;  Location: [facility] OR;  Service: Orthopedics;  Laterality: Left;   TUBAL LIGATION  10/19/2010     Prior to Admission medications   Medication Sig Start Date End Date Taking? Authorizing Provider  acetaminophen  (TYLENOL ) 500 MG tablet Take 1,000 mg by mouth every 6  (six) hours as needed for mild pain (pain score 1-3) or moderate pain (pain score 4-6).    [provider]  albuterol  (VENTOLIN  HFA) 108 (90 Base) MCG/ACT inhaler Inhale 2 puffs into the lungs every 6 (six) hours as needed for wheezing or shortness of breath. 05/29/23   Setzer, Nena PARAS, PA-C  amitriptyline  (ELAVIL ) 10 MG tablet Take 10 mg by mouth at bedtime. 10/22/23   [provider]  benzonatate  (TESSALON ) 100 MG capsule Take 1 capsule (100 mg total) by mouth every 8 (eight) hours. 11/09/23   Ward, Harlene PEDLAR, PA-C  Continuous Glucose Sensor (DEXCOM G7 SENSOR) MISC 1 Device by Does not apply route as directed. Change sensor every 10 days Patient taking differently: Inject 1 Device into the skin See admin instructions. Change sensor every 10 days 06/21/23   Shamleffer, Ibtehal Jaralla, MD  Cyanocobalamin  (VITAMIN B-12) 2500 MCG SUBL Place 1 tablet (2,500 mcg total) under the tongue daily. 04/23/23   Babara Call, MD  ferrous sulfate  325 (65 FE) MG tablet Take 1 tablet (325 mg total) by mouth daily with breakfast. 05/29/23   Setzer, Sandra J, PA-C  fluticasone  (FLONASE ) 50 MCG/ACT nasal spray Place 2 sprays into both nostrils daily. 10/31/23   Odell Celinda Balo, MD  furosemide  (LASIX ) 20 MG tablet TAKE 1 TABLET(20 MG) BY MOUTH IN THE MORNING 11/14/23   Tolia, Sunit, DO  gabapentin  (NEURONTIN ) 800 MG  tablet Take 800 mg by mouth 3 (three) times daily.    [provider]  Galcanezumab -gnlm (EMGALITY ) 120 MG/ML SOAJ Inject 120 mg into the skin every 30 (thirty) days. 08/20/23   Gayland Lauraine PARAS, NP  ibuprofen  (ADVIL ) 600 MG tablet Take 1 tablet (600 mg total) by mouth every 6 (six) hours as needed. Patient not taking: Reported on 11/09/2023 10/01/23   Constant, Peggy, MD  Insulin  Disposable Pump (OMNIPOD 5 G7 INTRO, GEN 5,) KIT 1 Device by Does not apply route every other day. 09/26/23   Shamleffer, Ibtehal Jaralla, MD  Insulin  Disposable Pump (OMNIPOD 5 G7 PODS, GEN 5,) MISC 1 Device by Does not  apply route every other day. 09/26/23   Shamleffer, Ibtehal Jaralla, MD  insulin  glargine (LANTUS  SOLOSTAR) 100 UNIT/ML Solostar Pen Inject 45 Units into the skin daily before breakfast. 09/26/23   Shamleffer, Ibtehal Jaralla, MD  insulin  lispro (HUMALOG  KWIKPEN) 100 UNIT/ML KwikPen Inject 12-22 Units into the skin 3 (three) times daily. Max daily 09/26/23   Shamleffer, Ibtehal Jaralla, MD  Insulin  Pen Needle 32G X 4 MM MISC 1 Device by Does not apply route in the morning, at noon, in the evening, and at bedtime. 09/26/23   Shamleffer, Ibtehal Jaralla, MD  ketorolac  (ACULAR ) 0.5 % ophthalmic solution Place 1 drop into the right eye 4 (four) times daily. 10/24/23   [provider]  losartan  (COZAAR ) 25 MG tablet TAKE 1 TABLET(25 MG) BY MOUTH DAILY 11/14/23   Tolia, Sunit, DO  metoprolol  tartrate (LOPRESSOR ) 25 MG tablet Take 1 tablet (25 mg total) by mouth 2 (two) times daily. 05/29/23 11/05/23  Setzer, Nena PARAS, PA-C  metroNIDAZOLE  (FLAGYL ) 500 MG tablet Take 1 tablet (500 mg total) by mouth 2 (two) times daily. 11/05/23   Constant, Peggy, MD  mometasone -formoterol  (DULERA ) 100-5 MCG/ACT AERO Inhale 2 puffs into the lungs 2 (two) times daily at 10 AM and 5 PM. 05/29/23   Setzer, Sandra J, PA-C  Multiple Vitamin (MULTIVITAMIN WITH MINERALS) TABS tablet Take 1 tablet by mouth daily. 05/29/23   Setzer, Sandra J, PA-C  ofloxacin  (OCUFLOX ) 0.3 % ophthalmic solution Place 1 drop into the right eye 4 (four) times daily. 10/24/23   [provider]  oxyCODONE -acetaminophen  (PERCOCET) 7.5-325 MG tablet Take 1 tablet by mouth 4 (four) times daily as needed for moderate pain (pain score 4-6). 10/22/23   [provider]  oxyCODONE -acetaminophen  (PERCOCET/ROXICET) 5-325 MG tablet Take 1 tablet by mouth every 6 (six) hours as needed. Patient not taking: Reported on 11/05/2023 10/01/23   Constant, Peggy, MD  pantoprazole  (PROTONIX ) 40 MG tablet Take 1 tablet (40 mg total) by mouth daily. 05/29/23   Setzer,  Sandra J, PA-C  prednisoLONE  acetate (PRED FORTE ) 1 % ophthalmic suspension Place 1 drop into the right eye 4 (four) times daily. 10/24/23   [provider]  predniSONE  (DELTASONE ) 10 MG tablet Takes 3 tablets for 1 day, then 2 tabs for 1 day, then 1 tab for 1 day, and then stop. Patient not taking: Reported on 11/09/2023 10/31/23   Odell Celinda Balo, MD  Rimegepant Sulfate (NURTEC) 75 MG TBDP Take 1 tablet (75 mg total) by mouth as needed. Take 1 tablet at onset of headache, max is 1 tablet in 24 hours. 08/20/23   Gayland Lauraine PARAS, NP  tiZANidine  (ZANAFLEX ) 4 MG tablet Take 4 mg by mouth every 12 (twelve) hours as needed for muscle spasms. 06/10/23   [provider]  vitamin C  (ASCORBIC ACID ) 250 MG tablet  Take 2 tablets (500 mg total) by mouth daily. 04/10/22   Babara Call, MD    Allergies: Trulicity  [dulaglutide ]    Review of Systems  Neurological:  Positive for headaches.    Updated Vital Signs BP 99/61 (BP Location: Right Arm)   Pulse 81   Temp 97.9 F (36.6 C)   Resp 17   Ht 5' 9 (1.753 m)   Wt 97.5 kg   LMP 10/01/2023 (Approximate)   SpO2 99%   BMI 31.75 kg/m   Physical Exam Vitals and nursing note reviewed.  Cardiovascular:     Rate and Rhythm: Normal rate.  Abdominal:     Tenderness: There is no abdominal tenderness.  Neurological:     Mental Status: She is alert.     (all labs ordered are listed, but only abnormal results are displayed) Labs Reviewed  COMPREHENSIVE METABOLIC PANEL WITH GFR - Abnormal; Notable for the following components:      Result Value   CO2 18 (*)    Glucose, Bld 186 (*)    BUN 29 (*)    Creatinine, Ser 1.81 (*)    Calcium  8.0 (*)    Total Protein 5.3 (*)    Albumin 2.5 (*)    GFR, Estimated 36 (*)    All other components within normal limits  CBC WITH DIFFERENTIAL/PLATELET - Abnormal; Notable for the following components:   RBC 2.46 (*)    Hemoglobin 6.8 (*)    HCT 20.8 (*)    RDW 17.9 (*)    Platelets 127 (*)    All  other components within normal limits  URINALYSIS, ROUTINE W REFLEX MICROSCOPIC - Abnormal; Notable for the following components:   Color, Urine RED (*)    APPearance   (*)    Value: TEST NOT REPORTED DUE TO COLOR INTERFERENCE OF URINE PIGMENT   Glucose, UA 250 (*)    Hgb urine dipstick LARGE (*)    Protein, ur >300 (*)    All other components within normal limits  URINALYSIS, MICROSCOPIC (REFLEX) - Abnormal; Notable for the following components:   Bacteria, UA   (*)    Value: TEST NOT REPORTED DUE TO COLOR INTERFERENCE OF URINE PIGMENT   All other components within normal limits  SARS CORONAVIRUS 2 BY RT PCR  LIPASE, BLOOD  HCG, SERUM, QUALITATIVE  TYPE AND SCREEN    EKG: None  Radiology: DG Chest Portable 1 View Result Date: 11/23/2023 CLINICAL DATA:  Shortness of breath. EXAM: PORTABLE CHEST 1 VIEW COMPARISON:  October 28, 2023. FINDINGS: The heart size and mediastinal contours are within normal limits. Both lungs are clear. The visualized skeletal structures are unremarkable. IMPRESSION: No active disease. Electronically Signed   By: Lynwood Landy Raddle M.D.   On: 11/23/2023 10:33     Procedures   Medications Ordered in the ED  sodium chloride  0.9 % bolus 500 mL (500 mLs Intravenous New Bag/Given 11/23/23 1016)  ketorolac  (TORADOL ) 15 MG/ML injection 15 mg (15 mg Intravenous Given 11/23/23 1013)                                    Medical Decision Making Amount and/or Complexity of Data Reviewed Labs: ordered. Radiology: ordered.  Risk Prescription drug management. Decision regarding hospitalization.   Patient with history of anemia.  Now vaginal bleeding.  Has had previous ablation.  States she feels dizzy.  Has some mild hypotension.  Will  get blood work.  Will get pregnancy test.  Also with cough and fatigue/myalgias will get COVID testing.  Reviewed recent oncology notes.  COVID testing is negative.  Chest x-ray reassuring.  Unfortunately hemoglobin has gone down  again.  Now back down to 6.8.  Had been due to get outpatient transfusion.  Creatinine also mildly increased.  Mild hypotension.  I think likely due to recurrent menses and bleeding.  Will discuss with hospitalist for admission.  CRITICAL CARE Performed by: [place] Total critical care time: 30 minutes Critical care time was exclusive of separately billable procedures and treating other patients. Critical care was necessary to treat or prevent imminent or life-threatening deterioration. Critical care was time spent personally by me on the following activities: development of treatment plan with patient and/or surrogate as well as nursing, discussions with consultants, evaluation of patient's response to treatment, examination of patient, obtaining history from patient or surrogate, ordering and performing treatments and interventions, ordering and review of laboratory studies, ordering and review of radiographic studies, pulse oximetry and re-evaluation of patient's condition.       Final diagnoses:  Anemia, unspecified type  AKI (acute kidney injury) [facility])    ED Discharge Orders     None          River Rankin, MD 11/23/23 1114

## 2023-11-23 NOTE — Assessment & Plan Note (Signed)
 Continue pantoprazole .

## 2023-11-23 NOTE — Assessment & Plan Note (Addendum)
 Symptomatic anemia, multifactorial, including menorrhagia (acute blood loss) and iron  deficiency.  She has work up as outpatient including bone marrow biopsy, 07/2023 with no significant changes.   Plan for PRBC transfusion x2.  Follow up up on iron  levels.  Follow up cell count in am.

## 2023-11-23 NOTE — Assessment & Plan Note (Signed)
 No signs of exacerbation.  Continue with LABA and ICS

## 2023-11-23 NOTE — Assessment & Plan Note (Addendum)
Continue blood pressure control with losartan and metoprolol.  ?

## 2023-11-24 DIAGNOSIS — J4520 Mild intermittent asthma, uncomplicated: Secondary | ICD-10-CM

## 2023-11-24 DIAGNOSIS — D62 Acute posthemorrhagic anemia: Secondary | ICD-10-CM | POA: Diagnosis not present

## 2023-11-24 DIAGNOSIS — D649 Anemia, unspecified: Secondary | ICD-10-CM | POA: Diagnosis not present

## 2023-11-24 DIAGNOSIS — N1831 Chronic kidney disease, stage 3a: Secondary | ICD-10-CM | POA: Diagnosis not present

## 2023-11-24 DIAGNOSIS — B348 Other viral infections of unspecified site: Secondary | ICD-10-CM

## 2023-11-24 DIAGNOSIS — N924 Excessive bleeding in the premenopausal period: Secondary | ICD-10-CM | POA: Diagnosis not present

## 2023-11-24 DIAGNOSIS — I10 Essential (primary) hypertension: Secondary | ICD-10-CM | POA: Diagnosis not present

## 2023-11-24 LAB — BASIC METABOLIC PANEL WITH GFR
Anion gap: 10 (ref 5–15)
Anion gap: 6 (ref 5–15)
BUN: 34 mg/dL — ABNORMAL HIGH (ref 6–20)
BUN: 30 mg/dL — ABNORMAL HIGH (ref 6–20)
CO2: 16 mmol/L — ABNORMAL LOW (ref 22–32)
CO2: 16 mmol/L — ABNORMAL LOW (ref 22–32)
Calcium: 7.7 mg/dL — ABNORMAL LOW (ref 8.9–10.3)
Calcium: 8 mg/dL — ABNORMAL LOW (ref 8.9–10.3)
Chloride: 108 mmol/L (ref 98–111)
Chloride: 110 mmol/L (ref 98–111)
Creatinine, Ser: 1.81 mg/dL — ABNORMAL HIGH (ref 0.44–1.00)
Creatinine, Ser: 1.44 mg/dL — ABNORMAL HIGH (ref 0.44–1.00)
GFR, Estimated: 36 mL/min — ABNORMAL LOW (ref >60)
GFR, Estimated: 47 mL/min — ABNORMAL LOW (ref >60)
Glucose, Bld: 314 mg/dL — ABNORMAL HIGH (ref 70–99)
Glucose, Bld: 266 mg/dL — ABNORMAL HIGH (ref 70–99)
Potassium: 5.4 mmol/L — ABNORMAL HIGH (ref 3.5–5.1)
Potassium: 5.4 mmol/L — ABNORMAL HIGH (ref 3.5–5.1)
Sodium: 134 mmol/L — ABNORMAL LOW (ref 135–145)
Sodium: 132 mmol/L — ABNORMAL LOW (ref 135–145)

## 2023-11-24 LAB — GLUCOSE, CAPILLARY
Glucose-Capillary: 310 mg/dL — ABNORMAL HIGH (ref 70–99)
Glucose-Capillary: 338 mg/dL — ABNORMAL HIGH (ref 70–99)
Glucose-Capillary: 279 mg/dL — ABNORMAL HIGH (ref 70–99)
Glucose-Capillary: 310 mg/dL — ABNORMAL HIGH (ref 70–99)

## 2023-11-24 LAB — CBC
HCT: 23.5 % — ABNORMAL LOW (ref 36.0–46.0)
Hemoglobin: 8.1 g/dL — ABNORMAL LOW (ref 12.0–15.0)
MCH: 28.6 pg (ref 26.0–34.0)
MCHC: 34.5 g/dL (ref 30.0–36.0)
MCV: 83 fL (ref 80.0–100.0)
Platelets: 118 K/uL — ABNORMAL LOW (ref 150–400)
RBC: 2.83 MIL/uL — ABNORMAL LOW (ref 3.87–5.11)
RDW: 16.7 % — ABNORMAL HIGH (ref 11.5–15.5)
WBC: 7.6 K/uL (ref 4.0–10.5)
nRBC: 0 % (ref 0.0–0.2)

## 2023-11-24 LAB — RESPIRATORY PANEL BY PCR

## 2023-11-24 LAB — IRON AND TIBC
Iron: 96 ug/dL (ref 28–170)
Saturation Ratios: 39 % — ABNORMAL HIGH (ref 10.4–31.8)
TIBC: 248 ug/dL — ABNORMAL LOW (ref 250–450)
UIBC: 152 ug/dL

## 2023-11-24 LAB — TYPE AND SCREEN
ABO/RH(D): A POS
Antibody Screen: NEGATIVE
Unit division: 0
Unit division: 0

## 2023-11-24 LAB — BPAM RBC
Blood Product Expiration Date: 202509252359
Blood Product Expiration Date: 202509292359
ISSUE DATE / TIME: 202509061237
ISSUE DATE / TIME: 202509062036
Unit Type and Rh: 6200
Unit Type and Rh: 6200

## 2023-11-24 LAB — TRANSFERRIN: Transferrin: 175 mg/dL — ABNORMAL LOW (ref 192–382)

## 2023-11-24 LAB — FERRITIN: Ferritin: 466 ng/mL — ABNORMAL HIGH (ref 11–307)

## 2023-11-24 MED ORDER — PREDNISOLONE ACETATE 1 % OP SUSP
1.0000 [drp] | Freq: Four times a day (QID) | OPHTHALMIC | Status: DC
  Administered 2023-11-24 – 2023-11-25 (×4): 1 [drp] via OPHTHALMIC
  Filled 2023-11-24: qty 5

## 2023-11-24 MED ORDER — METOPROLOL TARTRATE 25 MG PO TABS: 25.0000 mg | ORAL_TABLET | Freq: Two times a day (BID) | ORAL | 0 refills | Status: AC

## 2023-11-24 MED ORDER — SODIUM ZIRCONIUM CYCLOSILICATE 10 G PO PACK
10.0000 g | PACK | Freq: Once | ORAL | Status: AC
  Administered 2023-11-24: 10 g via ORAL
  Filled 2023-11-24: qty 1

## 2023-11-24 MED ORDER — OFLOXACIN 0.3 % OP SOLN
1.0000 [drp] | Freq: Four times a day (QID) | OPHTHALMIC | Status: DC
  Administered 2023-11-24 – 2023-11-25 (×4): 1 [drp] via OPHTHALMIC
  Filled 2023-11-24: qty 5

## 2023-11-24 MED ORDER — BRIMONIDINE TARTRATE 0.2 % OP SOLN
1.0000 [drp] | Freq: Two times a day (BID) | OPHTHALMIC | Status: DC
  Administered 2023-11-24 – 2023-11-25 (×3): 1 [drp] via OPHTHALMIC
  Filled 2023-11-24: qty 5

## 2023-11-24 MED ORDER — AMLODIPINE BESYLATE 10 MG PO TABS: 10.0000 mg | ORAL_TABLET | Freq: Every day | ORAL | Status: DC

## 2023-11-24 MED ORDER — AMLODIPINE BESYLATE 5 MG PO TABS
5.0000 mg | ORAL_TABLET | Freq: Every day | ORAL | Status: DC
  Administered 2023-11-24 – 2023-11-25 (×2): 5 mg via ORAL
  Filled 2023-11-24 (×2): qty 1

## 2023-11-24 MED ORDER — AMLODIPINE BESYLATE 5 MG PO TABS: 5.0000 mg | ORAL_TABLET | Freq: Every day | ORAL | 0 refills | Status: DC

## 2023-11-24 MED ORDER — KETOROLAC TROMETHAMINE 0.5 % OP SOLN
1.0000 [drp] | Freq: Four times a day (QID) | OPHTHALMIC | Status: DC
  Administered 2023-11-24 – 2023-11-25 (×4): 1 [drp] via OPHTHALMIC
  Filled 2023-11-24: qty 5

## 2023-11-24 MED ORDER — DORZOLAMIDE HCL-TIMOLOL MAL 2-0.5 % OP SOLN
1.0000 [drp] | Freq: Two times a day (BID) | OPHTHALMIC | Status: DC
  Administered 2023-11-24 – 2023-11-25 (×2): 1 [drp] via OPHTHALMIC
  Filled 2023-11-24 (×2): qty 10

## 2023-11-24 NOTE — Plan of Care (Signed)
 Potassium still elevated, will recheck and possible dc tomorrow.

## 2023-11-24 NOTE — Care Management Obs Status (Signed)
 MEDICARE OBSERVATION STATUS NOTIFICATION   Patient Details  Name: Melinda Stone MRN: [id number] Date of Birth: [birth year]   Medicare Observation Status Notification Given:  Yes    Robynn Eileen Hoose, RN 11/24/2023, 1:53 PM

## 2023-11-24 NOTE — Hospital Course (Addendum)
 Mrs. Melinda Stone was admitted to the hospital with the working diagnosis of symptomatic anemia.   43 y.o. female with medical history significant of chronic anemia who presented with fatigue and dyspnea on exertion. Recent hospitalization 08/11 to 10/31/23 for sepsis due to sever sinusitis, she had low hgb and required PRBC transfusion with a discharge hgb of 8.  11/09/23 urgent care visit due to cough and edema.   Patient reported 10 days, of worsening and progressive dyspnea on exertion, easy fatigability and generalized weakness. No frank chest pain, edema, PND or orthopnea. For the last 3 days she has been experiencing generalized malaise, cough and congestion. Positive sick contacts at home. Over last 24 hrs prior to admission she started having heavy menstrual period with mild pelvic pain, but no fever or chills.  On her initial physical examination her blood pressure was 110/64, HR 84 RR 18 and 02 saturation 100%  ENT with mild pallor with no icterus Cardiovascular with S1 and S2 present and regular with no gallops, rubs or murmurs No JVD Respiratory with no rales or wheezing, no rhonchi,  Abdomen protuberant, non tender and not distended Trace lower extremity edema.  Na 136, K 5.1 Cl 110 bicarbonate 18 glucose 186, bun 29 cr 1,81 AST 15 ALT 9  Wbc 6.9 hgb 6.8 plt 127  Sars COVID 19 negative  Urine analysis SG 1,025, protein > 300, negative leukocytes, large hgb, glucose 250,    Chest radiograph with hypoinflation, with positive cardiomegaly, with no infiltrates, or effusions.    EKG 82 bpm, right axis deviation, normal intervals, qtc 475, sinus rhythm with no significant ST segment or T wave changes.   Patient had 2 units PRBC transfusion with appropriate response. Her menstrual bleeding is improving.  Patient will follow up as outpatient with GYN.

## 2023-11-24 NOTE — Discharge Summary (Signed)
 Physician Discharge Summary   Patient: Melinda Stone MRN: [id number] DOB: 1980/11/10  Admit date:     11/23/2023  Discharge date: 11/25/23  Discharge Physician: Melinda Stone   PCP: Melinda Shuck, NP   Recommendations at discharge:    Patient had 2 units PRBC transfusion with appropriate response.   Patient was placed on amlodipine  for blood pressure control, holding on losartan  due to hyperkalemia Follow up renal function, electrolytes and cell count as outpatient in 7 days.  Follow up with Stone Gwenith NP in 7 to 10 days Follow up with GYN within 7 days   Discharge Diagnoses: Principal Problem:   Anemia Active Problems:   Menorrhagia   Essential hypertension   Type 2 diabetes mellitus with hyperglycemia, without long-term current use of insulin  (HCC)   Low serum vitamin B12   Esophageal reflux   Acute viral syndrome   Mild intermittent asthma   Obesity, class 1  Resolved Problems:   * No resolved hospital problems. [facility] Course: Melinda Stone was admitted to the hospital with the working diagnosis of symptomatic anemia.   43 y.o. female with medical history significant of chronic anemia who presented with fatigue and dyspnea on exertion. Recent hospitalization 08/11 to 10/31/23 for sepsis due to sever sinusitis, she had low hgb and required PRBC transfusion with a discharge hgb of 8.  11/09/23 urgent care visit due to cough and edema.   Patient reported 10 days, of worsening and progressive dyspnea on exertion, easy fatigability and generalized weakness. No frank chest pain, edema, PND or orthopnea. For the last 3 days she has been experiencing generalized malaise, cough and congestion. Positive sick contacts at home. Over last 24 hrs prior to admission she started having heavy menstrual period with mild pelvic pain, but no fever or chills.  On her initial physical examination her blood pressure was 110/64, HR 84 RR 18 and 02 saturation 100%   ENT with mild pallor with no icterus Cardiovascular with S1 and S2 present and regular with no gallops, rubs or murmurs No JVD Respiratory with no rales or wheezing, no rhonchi,  Abdomen protuberant, non tender and not distended Trace lower extremity edema.  Na 136, K 5.1 Cl 110 bicarbonate 18 glucose 186, bun 29 cr 1,81 AST 15 ALT 9  Wbc 6.9 hgb 6.8 plt 127  Sars COVID 19 negative  Urine analysis SG 1,025, protein > 300, negative leukocytes, large hgb, glucose 250,    Chest radiograph with hypoinflation, with positive cardiomegaly, with no infiltrates, or effusions.    EKG 82 bpm, right axis deviation, normal intervals, qtc 475, sinus rhythm with no significant ST segment or T wave changes.   Patient had 2 units PRBC transfusion with appropriate response. Her menstrual bleeding is improving.  Patient will follow up as outpatient with GYN.   Assessment and Plan: * Acute on chronic anemia Symptomatic anemia, multifactorial, including menorrhagia (acute blood loss) and chronic iron  deficiency.  Thrombocytopenia.  She had work up as outpatient including bone marrow biopsy, 07/2023 with no significant changes.   Patient had 2 units PRBC transfusion with good toleration. Her discharge hgb is 7.0 with platelet at 118  Iron  panel with serum iron  96, TIBC 248, transferrin saturation 39, transferrin 175 and ferritin 466.   Patient will follow up as outpatient with hematology. Her menstrual period bleeding is improving today.   Menorrhagia She had uterine ablation in July 2025, She has on megace  in the past.  Follow up with GYN  next week.   Essential hypertension Because of hyperkalemia, will hold on losartan , change to amlodipine  and continue metoprolol . Follow up as outpatient.   Chronic kidney disease, stage 3a (HCC) Hyperkalemia and hyponatremia.   She has been recently started on losartan .  Patient required 2 doses of sodium zirconium 10 g, with improvement in serum K At  the time of her discharge her serum cr is 1,34 with K at 5,0 and serum bicarbonate at 17 Na 134    Plan to hold no losartan  for now and will change to amlodipine  for blood pressure control.  Follow up renal function and electrolytes as outpatient.   Type 2 diabetes mellitus with hyperglycemia, without long-term current use of insulin  (HCC) Hyperglycemia.   Patient was placed on insulin  sliding scale and basal insulin , at the time of her discharge she will resume insulin  per insulin  pump.    Low serum vitamin B12 Continue supplementation as outpatient   Esophageal reflux Continue pantoprazole .   Rhinovirus Patient tested positive for Rhinovirus,  Continue supportive medical therapy, her symptoms are improving.   Mild intermittent asthma No signs of exacerbation.  Continue with LABA and ICS  Obesity, class 1 Calculated BMI is 31.7       Consultants: none  Procedures performed: none   Disposition: Home Diet recommendation:  Cardiac and Carb modified diet DISCHARGE MEDICATION: Allergies as of 11/24/2023       Reactions   Trulicity  [dulaglutide ] Diarrhea        Medication List     STOP taking these medications    benzonatate  100 MG capsule Commonly known as: TESSALON    ibuprofen  600 MG tablet Commonly known as: ADVIL    losartan  25 MG tablet Commonly known as: COZAAR    metroNIDAZOLE  500 MG tablet Commonly known as: FLAGYL    predniSONE  10 MG tablet Commonly known as: DELTASONE        TAKE these medications    acetaminophen  500 MG tablet Commonly known as: TYLENOL  Take 1,000 mg by mouth every 6 (six) hours as needed for mild pain (pain score 1-3) or moderate pain (pain score 4-6).   amitriptyline  10 MG tablet Commonly known as: ELAVIL  Take 10 mg by mouth at bedtime.   amLODipine  5 MG tablet Commonly known as: NORVASC  Take 1 tablet (5 mg total) by mouth daily.   brimonidine  0.2 % ophthalmic solution Commonly known as: ALPHAGAN  Place 1 drop into  the left eye 2 (two) times daily.   Dexcom G7 Sensor Misc 1 Device by Does not apply route as directed. Change sensor every 10 days What changed:  how to take this when to take this   dorzolamide -timolol  2-0.5 % ophthalmic solution Commonly known as: COSOPT  Place 1 drop into the left eye 2 (two) times daily.   Dulera  100-5 MCG/ACT Aero Generic drug: mometasone -formoterol  Inhale 2 puffs into the lungs 2 (two) times daily at 10 AM and 5 PM.   Emgality  120 MG/ML Soaj Generic drug: Galcanezumab -gnlm Inject 120 mg into the skin every 30 (thirty) days.   FeroSul 325 (65 Fe) MG tablet Generic drug: ferrous sulfate  Take 1 tablet (325 mg total) by mouth daily with breakfast.   fluticasone  50 MCG/ACT nasal spray Commonly known as: FLONASE  Place 2 sprays into both nostrils daily.   furosemide  20 MG tablet Commonly known as: LASIX  TAKE 1 TABLET(20 MG) BY MOUTH IN THE MORNING   gabapentin  800 MG tablet Commonly known as: NEURONTIN  Take 800 mg by mouth 3 (three) times daily.   insulin  lispro 100  UNIT/ML KwikPen Commonly known as: HumaLOG  KwikPen Inject 12-22 Units into the skin 3 (three) times daily. Max daily   Insulin  Pen Needle 32G X 4 MM Misc 1 Device by Does not apply route in the morning, at noon, in the evening, and at bedtime.   ketorolac  0.5 % ophthalmic solution Commonly known as: ACULAR  Place 1 drop into the right eye 4 (four) times daily.   Lantus  SoloStar 100 UNIT/ML Solostar Pen Generic drug: insulin  glargine Inject 45 Units into the skin daily before breakfast.   metoprolol  tartrate 25 MG tablet Commonly known as: LOPRESSOR  Take 1 tablet (25 mg total) by mouth 2 (two) times daily.   multivitamin with minerals Tabs tablet Take 1 tablet by mouth daily.   Nurtec 75 MG Tbdp Generic drug: Rimegepant Sulfate Take 1 tablet (75 mg total) by mouth as needed. Take 1 tablet at onset of headache, max is 1 tablet in 24 hours.   ofloxacin  0.3 % ophthalmic  solution Commonly known as: OCUFLOX  Place 1 drop into the right eye 4 (four) times daily.   Omnipod 5 G7 Pods (Gen 5) Misc 1 Device by Does not apply route every other day.   oxyCODONE -acetaminophen  7.5-325 MG tablet Commonly known as: PERCOCET Take 1 tablet by mouth 4 (four) times daily as needed for moderate pain (pain score 4-6).   pantoprazole  40 MG tablet Commonly known as: PROTONIX  Take 1 tablet (40 mg total) by mouth daily.   prednisoLONE  acetate 1 % ophthalmic suspension Commonly known as: PRED FORTE  Place 1 drop into the right eye 4 (four) times daily.   tiZANidine  4 MG tablet Commonly known as: ZANAFLEX  Take 4 mg by mouth every 12 (twelve) hours as needed for muscle spasms.   Ventolin  HFA 108 (90 Base) MCG/ACT inhaler Generic drug: albuterol  Inhale 2 puffs into the lungs every 6 (six) hours as needed for wheezing or shortness of breath.   Vitamin B-12 2500 MCG Subl Place 1 tablet (2,500 mcg total) under the tongue daily.   vitamin C  250 MG tablet Commonly known as: ASCORBIC ACID  Take 2 tablets (500 mg total) by mouth daily.        Discharge Exam: Filed Weights   11/23/23 0835  Weight: 97.5 kg   BP (!) 144/75 (BP Location: Right Arm)   Pulse 82   Temp 98.2 F (36.8 C) (Oral)   Resp 18   Ht 5' 9 (1.753 m)   Wt 97.5 kg   LMP 10/01/2023 (Approximate)   SpO2 100%   BMI 31.75 kg/m   Patient is feeling better, no chest pain and no dyspnea, mild lower abdominal cramps   Neurology awake and alert ENT with mild pallor  Cardiovascular with S1 and S2 present and regular with no gallops, rubs or murmurs Respiratory with no rales or wheezing, no rhonchi  Abdomen protuberant, not distended and non tender No lower extremity edema  No rashes  Condition at discharge: stable  The results of significant diagnostics from this hospitalization (including imaging, microbiology, ancillary and laboratory) are listed below for reference.   Imaging Studies: DG  Chest Portable 1 View Result Date: 11/23/2023 CLINICAL DATA:  Shortness of breath. EXAM: PORTABLE CHEST 1 VIEW COMPARISON:  October 28, 2023. FINDINGS: The heart size and mediastinal contours are within normal limits. Both lungs are clear. The visualized skeletal structures are unremarkable. IMPRESSION: No active disease. Electronically Signed   By: Lynwood Landy Raddle M.D.   On: 11/23/2023 10:33   CT Head Wo Contrast Result Date:  10/28/2023 CLINICAL DATA:  Headache. EXAM: CT HEAD WITHOUT CONTRAST TECHNIQUE: Contiguous axial images were obtained from the base of the skull through the vertex without intravenous contrast. RADIATION DOSE REDUCTION: This exam was performed according to the departmental dose-optimization program which includes automated exposure control, adjustment of the mA and/or kV according to patient size and/or use of iterative reconstruction technique. COMPARISON:  May 18, 2023 FINDINGS: Brain: No evidence of acute infarction, hemorrhage, hydrocephalus, extra-axial collection or mass lesion/mass effect. Vascular: No hyperdense vessel or unexpected calcification. Skull: A chronic fracture deformity is seen involving the right zygomatic arch. Sinuses/Orbits: There is marked severity bilateral ethmoid sinus mucosal thickening. Moderate severity sphenoid sinus mucosal thickening is also seen. Small to moderate size bilateral maxillary sinus air-fluid levels are noted. Other: None. IMPRESSION: 1. No acute intracranial abnormality. 2. Marked severity bilateral ethmoid sinus and moderate severity sphenoid sinus disease. 3. Small to moderate size bilateral maxillary sinus air-fluid levels, consistent with acute sinusitis. Electronically Signed   By: Suzen Dials M.D.   On: 10/28/2023 20:08   DG Chest 2 View Result Date: 10/28/2023 CLINICAL DATA:  Short of breath, cough EXAM: CHEST - 2 VIEW COMPARISON:  None Available. FINDINGS: Normal mediastinum and cardiac silhouette. Normal pulmonary  vasculature. No evidence of effusion, infiltrate, or pneumothorax. No acute bony abnormality. IMPRESSION: No acute cardiopulmonary process. Electronically Signed   By: Jackquline Boxer M.D.   On: 10/28/2023 17:25    Microbiology: Results for orders placed or performed during the hospital encounter of 11/23/23  SARS Coronavirus 2 by RT PCR (hospital order, performed in [facility] hospital lab) *cepheid single result test* Anterior Nasal Swab     Status: None   Collection Time: 11/23/23 10:17 AM   Specimen: Anterior Nasal Swab  Result Value Ref Range Status   SARS Coronavirus 2 by RT PCR NEGATIVE NEGATIVE Final    Comment: Performed at [facility], [address], [place], [place] [address]  Respiratory (~20 pathogens) panel by PCR     Status: Abnormal   Collection Time: 11/23/23 11:10 PM   Specimen: Nasopharyngeal Swab; Respiratory  Result Value Ref Range Status   Adenovirus NOT DETECTED NOT DETECTED Final   Coronavirus 229E NOT DETECTED NOT DETECTED Final    Comment: (NOTE) The Coronavirus on the Respiratory Panel, DOES NOT test for the novel  Coronavirus (2019 nCoV)    Coronavirus HKU1 NOT DETECTED NOT DETECTED Final   Coronavirus NL63 NOT DETECTED NOT DETECTED Final   Coronavirus OC43 NOT DETECTED NOT DETECTED Final   Metapneumovirus NOT DETECTED NOT DETECTED Final   Rhinovirus / Enterovirus DETECTED (A) NOT DETECTED Final   Influenza A NOT DETECTED NOT DETECTED Final   Influenza B NOT DETECTED NOT DETECTED Final   Parainfluenza Virus 1 NOT DETECTED NOT DETECTED Final   Parainfluenza Virus 2 NOT DETECTED NOT DETECTED Final   Parainfluenza Virus 3 NOT DETECTED NOT DETECTED Final   Parainfluenza Virus 4 NOT DETECTED NOT DETECTED Final   Respiratory Syncytial Virus NOT DETECTED NOT DETECTED Final   Bordetella pertussis NOT DETECTED NOT DETECTED Final   Bordetella Parapertussis NOT DETECTED NOT DETECTED Final   Chlamydophila pneumoniae NOT DETECTED NOT DETECTED Final    Mycoplasma pneumoniae NOT DETECTED NOT DETECTED Final    Comment: Performed at [facility], [address], [place], [place] [address]    Labs: CBC: Recent Labs  Lab 11/23/23 0838 11/24/23 0148  WBC 6.9 7.6  NEUTROABS 4.7  --   HGB 6.8* 8.1*  HCT 20.8* 23.5*  MCV 84.6 83.0  PLT 127* 118*   Basic Metabolic Panel: Recent Labs  Lab 11/23/23 0838 11/24/23 0148  NA 136 134*  K 5.1 5.4*  CL 110 108  CO2 18* 16*  GLUCOSE 186* 314*  BUN 29* 34*  CREATININE 1.81* 1.81*  CALCIUM  8.0* 7.7*   Liver Function Tests: Recent Labs  Lab 11/23/23 0838  AST 15  ALT 9  ALKPHOS 88  BILITOT 1.0  PROT 5.3*  ALBUMIN 2.5*   CBG: Recent Labs  Lab 11/23/23 1357 11/23/23 1644 11/23/23 2147 11/23/23 2150 11/24/23 0803  GLUCAP 207* 299* 340* 358* 310*    Discharge time spent: greater than 30 minutes.  Signed: Elidia Toribio Furnace, MD [facility] 11/24/2023

## 2023-11-24 NOTE — Assessment & Plan Note (Signed)
 Hyperkalemia and hyponatremia.   She has been recently started on losartan .  Patient required 2 doses of sodium zirconium 10 g, with improvement in serum K At the time of her discharge her serum cr is 1,34 with K at 5,0 and serum bicarbonate at 17 Na 134    Plan to hold no losartan  for now and will change to amlodipine  for blood pressure control.  Follow up renal function and electrolytes as outpatient.

## 2023-11-25 DIAGNOSIS — D62 Acute posthemorrhagic anemia: Secondary | ICD-10-CM | POA: Diagnosis not present

## 2023-11-25 LAB — BASIC METABOLIC PANEL WITH GFR
Anion gap: 9 (ref 5–15)
BUN: 28 mg/dL — ABNORMAL HIGH (ref 6–20)
CO2: 17 mmol/L — ABNORMAL LOW (ref 22–32)
Calcium: 8 mg/dL — ABNORMAL LOW (ref 8.9–10.3)
Chloride: 108 mmol/L (ref 98–111)
Creatinine, Ser: 1.34 mg/dL — ABNORMAL HIGH (ref 0.44–1.00)
GFR, Estimated: 51 mL/min — ABNORMAL LOW (ref >60)
Glucose, Bld: 319 mg/dL — ABNORMAL HIGH (ref 70–99)
Potassium: 5 mmol/L (ref 3.5–5.1)
Sodium: 134 mmol/L — ABNORMAL LOW (ref 135–145)

## 2023-11-25 LAB — HEMOGLOBIN AND HEMATOCRIT, BLOOD
HCT: 21.1 % — ABNORMAL LOW (ref 36.0–46.0)
Hemoglobin: 7 g/dL — ABNORMAL LOW (ref 12.0–15.0)

## 2023-11-25 LAB — GLUCOSE, CAPILLARY
Glucose-Capillary: 278 mg/dL — ABNORMAL HIGH (ref 70–99)
Glucose-Capillary: 361 mg/dL — ABNORMAL HIGH (ref 70–99)

## 2023-11-25 NOTE — Progress Notes (Signed)
   11/25/23 0930  TOC Brief Assessment  Insurance and Status Reviewed  Patient has primary care physician Yes  Home environment has been reviewed 3 children  Prior level of function: independent has cane, rollator and shower seat at home  Prior/Current Home Services No current home services  Social Drivers of Health Review SDOH reviewed no interventions necessary  Readmission risk has been reviewed Yes  Transition of care needs no transition of care needs at this time    Transition of Care [facility]) Screening Note   Patient Details  Name: Melinda Stone Date of Birth: May 13, 1980   Transition of Care [facility]) CM/SW Contact:    Stephane Powell Jansky, RN Phone Number: 11/25/2023, 9:31 AM    Inpatient Care Management (ICM) has reviewed patient and no ICM needs have been identified at this time. We will continue to monitor patient advancement through interdisciplinary progression rounds. If new patient transition needs arise, please place a ICM consult.

## 2023-11-25 NOTE — Progress Notes (Signed)
 Patient discharge was delayed due to persistent hyperkalemia. She has received sodium zirconium x 2 doses yesterday.  Her menstrual bleeding continue to improve  BP 115/78 (BP Location: Right Arm)   Pulse 72   Temp 98.5 F (36.9 C) (Oral)   Resp 18   Ht 5' 9 (1.753 m)   Wt 97.5 kg   LMP 10/01/2023 (Approximate)   SpO2 98%   BMI 31.75 kg/m   Neurology awake and alert ENT with mild pallor with no icterus Cardiovascular with S1 and S2 present and regular with no gallops Respiratory with no rales or wheezing Abdomen with no distention  No lower extremity edema   Follow up k is 5.0 with serum cr at 13,4 Continue amlodipine  for blood pressure control  2. Follow up hgb is 7,0, her menstrual bleeding continue to improve.   Plan for discharge home and follow up with primary care within 7 days.

## 2023-11-25 NOTE — Plan of Care (Signed)

## 2023-11-25 NOTE — Inpatient Diabetes Management (Addendum)
 Inpatient Diabetes Program Recommendations  AACE/ADA: New Consensus Statement on Inpatient Glycemic Control (2015)  Target Ranges:  Prepandial:   less than 140 mg/dL      Peak postprandial:   less than 180 mg/dL (1-2 hours)      Critically ill patients:  140 - 180 mg/dL   Lab Results  Component Value Date   GLUCAP 278 (H) 11/25/2023   HGBA1C 7.5 (H) 10/01/2023    Review of Glycemic Control  Latest Reference Range & Units 11/24/23 08:03 11/24/23 11:53 11/24/23 17:46 11/24/23 21:55 11/25/23 09:02  Glucose-Capillary 70 - 99 mg/dL 689 (H) 661 (H) 720 (H) 310 (H) 278 (H)  (H): Data is abnormally high  Diabetes history: DM2 Outpatient Diabetes medications: Omnipod with Dexcom G7 Off pump- Lantus  45 units every day, Humalog  12 units TID and correction scale TID. Current orders for Inpatient glycemic control: Lantus  24 units every day, Novolog  0-15 units TID  Inpatient Diabetes Program Recommendations:    Please consider:  Lantus  35 units at bedtime Humalog  5 units TID with meals if she consumes at least 50% Novolog  0-9 units TID and 0-5 units at bedtime  Met with patient at bedside.  She recently started the Omnipod insulin  pump and uses the Dexcom G7.  Endocrinologist is Dr. Sam.  She ordered the insulin  pump on 7/10.  Patient states she has had some lows on her pump and prefers injections and her Dexcom.  She confirms above off pump insulin  regimen.  Current A1C likely inaccurate due to anemia.  She has taken Trulicity  in the past.  Though Shamle[facility] has ordered Ozempic , she has not picked up.    Thank you, Wyvonna Pinal, MSN, CDCES Diabetes Coordinator Inpatient Diabetes Program [phone] (team pager from 8a-5p)

## 2023-11-26 ENCOUNTER — Ambulatory Visit

## 2023-11-26 ENCOUNTER — Other Ambulatory Visit

## 2023-11-27 ENCOUNTER — Encounter

## 2023-11-28 ENCOUNTER — Inpatient Hospital Stay

## 2023-11-28 ENCOUNTER — Telehealth: Payer: Self-pay

## 2023-11-28 ENCOUNTER — Other Ambulatory Visit: Payer: Self-pay | Admitting: Oncology

## 2023-11-28 ENCOUNTER — Other Ambulatory Visit: Payer: Self-pay

## 2023-11-28 VITALS — BP 124/79

## 2023-11-28 DIAGNOSIS — E538 Deficiency of other specified B group vitamins: Secondary | ICD-10-CM | POA: Diagnosis not present

## 2023-11-28 DIAGNOSIS — D631 Anemia in chronic kidney disease: Secondary | ICD-10-CM | POA: Diagnosis not present

## 2023-11-28 DIAGNOSIS — Z23 Encounter for immunization: Secondary | ICD-10-CM

## 2023-11-28 DIAGNOSIS — D649 Anemia, unspecified: Secondary | ICD-10-CM

## 2023-11-28 DIAGNOSIS — R700 Elevated erythrocyte sedimentation rate: Secondary | ICD-10-CM | POA: Diagnosis not present

## 2023-11-28 DIAGNOSIS — D519 Vitamin B12 deficiency anemia, unspecified: Secondary | ICD-10-CM

## 2023-11-28 DIAGNOSIS — N1831 Chronic kidney disease, stage 3a: Secondary | ICD-10-CM | POA: Diagnosis present

## 2023-11-28 DIAGNOSIS — R591 Generalized enlarged lymph nodes: Secondary | ICD-10-CM | POA: Diagnosis not present

## 2023-11-28 DIAGNOSIS — R162 Hepatomegaly with splenomegaly, not elsewhere classified: Secondary | ICD-10-CM | POA: Diagnosis not present

## 2023-11-28 LAB — CBC WITH DIFFERENTIAL (CANCER CENTER ONLY)
Abs Immature Granulocytes: 0.08 K/uL — ABNORMAL HIGH (ref 0.00–0.07)
Basophils Absolute: 0.1 K/uL (ref 0.0–0.1)
Basophils Relative: 1 %
Eosinophils Absolute: 0 K/uL (ref 0.0–0.5)
Eosinophils Relative: 0 %
HCT: 20.4 % — ABNORMAL LOW (ref 36.0–46.0)
Hemoglobin: 7 g/dL — ABNORMAL LOW (ref 12.0–15.0)
Immature Granulocytes: 1 %
Lymphocytes Relative: 16 %
Lymphs Abs: 1.5 K/uL (ref 0.7–4.0)
MCH: 28.6 pg (ref 26.0–34.0)
MCHC: 34.3 g/dL (ref 30.0–36.0)
MCV: 83.3 fL (ref 80.0–100.0)
Monocytes Absolute: 0.5 K/uL (ref 0.1–1.0)
Monocytes Relative: 5 %
Neutro Abs: 7.2 K/uL (ref 1.7–7.7)
Neutrophils Relative %: 77 %
Platelet Count: 179 K/uL (ref 150–400)
RBC: 2.45 MIL/uL — ABNORMAL LOW (ref 3.87–5.11)
RDW: 17.8 % — ABNORMAL HIGH (ref 11.5–15.5)
WBC Count: 9.4 K/uL (ref 4.0–10.5)
nRBC: 0 % (ref 0.0–0.2)

## 2023-11-28 LAB — RETIC PANEL
Immature Retic Fract: 9.4 % (ref 2.3–15.9)
RBC.: 2.4 MIL/uL — ABNORMAL LOW (ref 3.87–5.11)
Retic Count, Absolute: 265.5 K/uL — ABNORMAL HIGH (ref 19.0–186.0)
Retic Ct Pct: 11.1 % — ABNORMAL HIGH (ref 0.4–3.1)
Reticulocyte Hemoglobin: 30.9 pg (ref >27.9)

## 2023-11-28 LAB — PREPARE RBC (CROSSMATCH)

## 2023-11-28 MED ORDER — EPOETIN ALFA-EPBX 40000 UNIT/ML IJ SOLN
40000.0000 [IU] | Freq: Once | INTRAMUSCULAR | Status: AC
  Administered 2023-11-28: 40000 [IU] via SUBCUTANEOUS
  Filled 2023-11-28: qty 1

## 2023-11-28 NOTE — Progress Notes (Deleted)
 Office Visit Note  Patient: Melinda Stone             Date of Birth: 03-May-1980           MRN: [id number]             PCP: [name], NP Referring: Babara Call, MD Visit Date: 11/29/2023 Occupation: @GUAROCC @  Subjective:  No chief complaint on file.    History of Present Illness: Melinda Stone is a 43 y.o. female ***   Activities of Daily Living:  Patient reports morning stiffness for *** {minute/hour:19697}.   Patient {ACTIONS;DENIES/REPORTS:21021675::Denies} nocturnal pain.  Difficulty dressing/grooming: {ACTIONS;DENIES/REPORTS:21021675::Denies} Difficulty climbing stairs: {ACTIONS;DENIES/REPORTS:21021675::Denies} Difficulty getting out of chair: {ACTIONS;DENIES/REPORTS:21021675::Denies} Difficulty using hands for taps, buttons, cutlery, and/or writing: {ACTIONS;DENIES/REPORTS:21021675::Denies}  No Rheumatology ROS completed.    Rheum History: # Diagnosed in ***.  Manifestation of disease:   Serologies: (+) *** (-) ***  Maintenance Labs: QuantiFERON: *** Hepatitis panel: ***  Current Treatment ***  Prior Treatments ***   PMFS History:  Patient Active Problem List   Diagnosis Date Noted   Chronic kidney disease, stage 3a (HCC) 11/24/2023   Acute on chronic anemia 11/23/2023   Rhinovirus 11/23/2023   ESR raised 11/05/2023   Leukocytosis 10/29/2023   Tension headache 10/29/2023   Moderate persistent asthma 10/29/2023   Acute sinusitis 10/28/2023   Dysfunctional uterine bleeding 10/01/2023   AKI (acute kidney injury) (HCC) 09/05/2023   Urinary tract infection symptoms 09/05/2023   Thrombocytopenia (HCC) 08/01/2023   Anemia of chronic disease 07/31/2023   Metabolic acidosis 07/31/2023   Menorrhagia 07/31/2023   Prolapsed lumbosacral intervertebral disc 07/31/2023   Essential hypertension 07/31/2023   Anxiety 07/31/2023   DM2 (diabetes mellitus, type 2) (HCC) 07/31/2023   Chlamydia 07/31/2023   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, with long-term current use of insulin  (HCC) 06/21/2023   Hepatosplenomegaly 06/14/2023   Respiratory failure (HCC) 05/23/2023   Debility 05/21/2023   Type 2 diabetes mellitus with hypoglycemia without coma (HCC) 05/21/2023   Hyperglycemia 05/15/2023   Acute hypoxic respiratory failure (HCC) 05/12/2023   Influenza A 05/12/2023   ARDS (adult respiratory distress syndrome) (HCC) 05/12/2023   Pneumonia due to infectious organism 05/12/2023   Nausea and vomiting 05/11/2023   CAP (community acquired pneumonia) 03/26/2023   Sepsis (HCC) 03/26/2023   Cellulitis of fifth toe 03/26/2023   Multifocal pneumonia 03/25/2023   DKA (diabetic ketoacidosis) (HCC) 12/24/2022   DKA, type 2 (HCC) 12/24/2022   Migraine 07/11/2022   Hypokalemia 05/14/2022   Normocytic anemia 05/14/2022   Pneumonitis 05/13/2022   Grade I diastolic dysfunction 05/13/2022   Carpal tunnel syndrome 05/13/2022   Pain of breast 05/13/2022   Contusion of upper limb 05/13/2022   Obesity, class 1 05/13/2022   Overweight (BMI 25.0-29.9) 05/13/2022   Aspirat pneumonitis due to anesth during preg, first tri 05/05/2022   Need for prophylactic vaccination and inoculation against influenza 02/07/2022   Low serum vitamin B12 01/30/2022   IDA (iron  deficiency anemia) 01/30/2022   Mild intermittent asthma 01/15/2022   Esophageal reflux 01/15/2022   Dyspnea 09/01/2021   Pulmonary nodules 09/01/2021   Absolute anemia 09/01/2021   Tobacco use 09/01/2021   Hypotension 09/01/2021   Nausea & vomiting 06/08/2021   UTI (urinary tract infection) 06/08/2021   Post laminectomy syndrome 04/11/2021   HNP (herniated nucleus pulposus), lumbar 11/14/2020   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   Chronic pain syndrome 06/14/2020   Pharmacologic therapy 06/14/2020   Disorder of  skeletal system 06/14/2020   Problems influencing health status 06/14/2020   History of marijuana use 06/14/2020   History of  illicit drug use 06/14/2020   Abnormal drug screen (05/25/2020) 06/14/2020   Marijuana use 06/14/2020   Abnormal MRI, lumbar spine (05/16/2020) 06/14/2020   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, L5-S1) 06/14/2020   Lumbar lateral recess stenosis (Left: L4-5, L5-S1) 06/14/2020   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) 06/14/2020   S/P lumbar microdiscectomy 06/07/2020   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   GI bleed 12/01/2019   Muscle spasm 11/06/2019   Sciatica 11/06/2019   Low back pain 02/04/2019   Type 2 diabetes mellitus with diabetic polyneuropathy, without long-term current use of insulin  (HCC) 12/03/2018   Type 2 diabetes mellitus with hyperglycemia, without long-term current use of insulin  (HCC) 05/15/2018    Past Medical History:  Diagnosis Date   Anemia    Arthritis 08/17/2020   Chronic back pain    Diabetes mellitus without complication (HCC)    type 2   GERD (gastroesophageal reflux disease)    Hypertension    Migraine    Ovarian cyst    Pneumonia    Polyneuropathy     Family History  Problem Relation Age of Onset   Diabetes Mother    Hypertension Mother    Stroke Mother    Past Surgical History:  Procedure Laterality Date   CESAREAN SECTION     CHOLECYSTECTOMY  2004   EYE SURGERY Left    cataract   HYSTEROSCOPY N/A 10/01/2023   Procedure: ABLATION, ENDOMETRIUM, HYSTEROSCOPIC;  Surgeon: Alger Gong, MD;  Location: [facility] OR;  Service: Gynecology;  Laterality: N/A;   IR BONE MARROW BIOPSY & ASPIRATION  08/01/2023   LUMBAR LAMINECTOMY N/A 05/30/2020   Procedure: Lumbar five Laminectomy,  Bilateral Microdiscectomy, Left Lumbar five -Sacral one Microdiscectomy;  Surgeon: Barbarann Oneil BROCKS, MD;  Location: [facility] OR;  Service: Orthopedics;  Laterality: N/A;   LUMBAR LAMINECTOMY Left 11/14/2020   Procedure: LEFT LUMBAR FOUR-FIVE MICRODISCECTOMY;  Surgeon: Barbarann Oneil BROCKS, MD;  Location: [facility] OR;  Service: Orthopedics;  Laterality: Left;    TUBAL LIGATION  10/19/2010   Social History   Social History Narrative   Lives with 3 children   Some coffee   Right handed   Doesn't work   Immunization History  Administered Date(s) Administered   Influenza-Unspecified 12/18/2022   Janssen (J&J) SARS-COV-2 Vaccination 08/25/2019   Pneumococcal Polysaccharide-23 05/09/2020   Tdap 08/04/2022     Objective: Vital Signs: LMP 10/01/2023 (Approximate)    Physical Exam Vitals and nursing note reviewed.  HENT:     Head: Normocephalic and atraumatic.     Nose: Nose normal.  Eyes:     Conjunctiva/sclera: Conjunctivae normal.     Pupils: Pupils are equal, round, and reactive to light.  Cardiovascular:     Rate and Rhythm: Normal rate and regular rhythm.     Heart sounds: Normal heart sounds.  Pulmonary:     Effort: Pulmonary effort is normal.     Breath sounds: Normal breath sounds.  Skin:    General: Skin is warm and dry.  Neurological:     Mental Status: She is alert. Mental status is at baseline.  Psychiatric:        Mood and Affect: Mood normal.        Behavior: Behavior normal.      Musculoskeletal Exam: ***  CDAI Exam: CDAI Score: -- Patient Global: --; Provider Global: -- Swollen: --;  Tender: -- Joint Exam 11/29/2023   No joint exam has been documented for this visit   There is currently no information documented on the homunculus. Go to the Rheumatology activity and complete the homunculus joint exam.  Investigation: No additional findings.  Imaging: DG Chest Portable 1 View Result Date: 11/23/2023 CLINICAL DATA:  Shortness of breath. EXAM: PORTABLE CHEST 1 VIEW COMPARISON:  October 28, 2023. FINDINGS: The heart size and mediastinal contours are within normal limits. Both lungs are clear. The visualized skeletal structures are unremarkable. IMPRESSION: No active disease. Electronically Signed   By: Lynwood Landy Raddle M.D.   On: 11/23/2023 10:33    Recent Labs: Lab Results  Component Value Date   WBC  9.4 11/28/2023   HGB 7.0 (L) 11/28/2023   PLT 179 11/28/2023   NA 134 (L) 11/25/2023   K 5.0 11/25/2023   CL 108 11/25/2023   CO2 17 (L) 11/25/2023   GLUCOSE 319 (H) 11/25/2023   BUN 28 (H) 11/25/2023   CREATININE 1.34 (H) 11/25/2023   BILITOT 1.0 11/23/2023   ALKPHOS 88 11/23/2023   AST 15 11/23/2023   ALT 9 11/23/2023   PROT 5.3 (L) 11/23/2023   ALBUMIN 2.5 (L) 11/23/2023   CALCIUM  8.0 (L) 11/25/2023   GFRAA 139 04/28/2020   Lab Results  Component Value Date   ANA Negative 09/10/2023   RF <10.0 09/10/2023    Speciality Comments: No specialty comments available.  Procedures:  No procedures performed Allergies: Trulicity  [dulaglutide ]   Assessment / Plan:     Visit Diagnoses: No diagnosis found.  An elevated erythrocyte sedimentation rate (ESR) is a nonspecific marker of inflammation. It can be elevated in a variety of conditions, including infections, inflammatory diseases such as rheumatoid arthritis and vasculitis, and malignancies. Non-rheumatic causes of elevated ESR include chronic kidney disease, anemia, and pregnancy. ESR can also be influenced by age and sex. It is important to interpret the ESR in the context of the patient's overall clinical picture and other laboratory findings.   Orders: No orders of the defined types were placed in this encounter.  No orders of the defined types were placed in this encounter.   I personally spent a total of *** minutes in the care of the patient today including {Time Based Coding:210964241}.  Follow-Up Instructions: No follow-ups on file.   Asberry Claw, DO

## 2023-11-28 NOTE — Telephone Encounter (Signed)
 Patient here for Retacrit  today.  Was in ER over the weekend and received 2 units of blood.  I asked if she has noticted any bleeding and patient reports that she started her menstrual cycle last week after Amenorrhea for 2 months.  Has an appt with GYN next week which was their first available appt.

## 2023-11-29 ENCOUNTER — Inpatient Hospital Stay

## 2023-11-29 ENCOUNTER — Encounter

## 2023-11-29 DIAGNOSIS — D649 Anemia, unspecified: Secondary | ICD-10-CM

## 2023-11-29 DIAGNOSIS — D696 Thrombocytopenia, unspecified: Secondary | ICD-10-CM

## 2023-11-29 DIAGNOSIS — R700 Elevated erythrocyte sedimentation rate: Secondary | ICD-10-CM

## 2023-11-29 DIAGNOSIS — N1831 Chronic kidney disease, stage 3a: Secondary | ICD-10-CM | POA: Diagnosis not present

## 2023-11-29 MED ORDER — SODIUM CHLORIDE 0.9% IV SOLUTION
250.0000 mL | INTRAVENOUS | Status: DC
  Administered 2023-11-29: 100 mL via INTRAVENOUS
  Filled 2023-11-29: qty 250

## 2023-11-29 MED ORDER — ACETAMINOPHEN 325 MG PO TABS
650.0000 mg | ORAL_TABLET | Freq: Once | ORAL | Status: AC
  Administered 2023-11-29: 650 mg via ORAL
  Filled 2023-11-29: qty 2

## 2023-11-29 MED ORDER — DIPHENHYDRAMINE HCL 25 MG PO CAPS
25.0000 mg | ORAL_CAPSULE | Freq: Once | ORAL | Status: AC
  Administered 2023-11-29: 25 mg via ORAL
  Filled 2023-11-29: qty 1

## 2023-11-30 LAB — BPAM RBC
Blood Product Expiration Date: 202509152359
ISSUE DATE / TIME: 202509121242
Unit Type and Rh: 202509152359
Unit Type and Rh: 6200

## 2023-11-30 LAB — TYPE AND SCREEN
ABO/RH(D): A POS
Antibody Screen: NEGATIVE
Unit division: 0

## 2023-12-02 ENCOUNTER — Ambulatory Visit

## 2023-12-02 VITALS — BP 157/90 | HR 86 | Temp 98.1°F | Resp 16 | Ht 69.0 in | Wt 221.4 lb

## 2023-12-02 DIAGNOSIS — N1831 Chronic kidney disease, stage 3a: Secondary | ICD-10-CM | POA: Diagnosis not present

## 2023-12-02 DIAGNOSIS — G629 Polyneuropathy, unspecified: Secondary | ICD-10-CM

## 2023-12-02 DIAGNOSIS — R918 Other nonspecific abnormal finding of lung field: Secondary | ICD-10-CM

## 2023-12-02 DIAGNOSIS — R700 Elevated erythrocyte sedimentation rate: Secondary | ICD-10-CM

## 2023-12-02 DIAGNOSIS — D649 Anemia, unspecified: Secondary | ICD-10-CM

## 2023-12-02 DIAGNOSIS — J4520 Mild intermittent asthma, uncomplicated: Secondary | ICD-10-CM

## 2023-12-02 NOTE — Progress Notes (Signed)
 Office Visit Note  Patient: Melinda Stone             Date of Birth: 1981-01-16           MRN: [id number]             PCP: [name], NP Referring: Babara Call, MD Visit Date: 12/02/2023 Occupation:   Subjective:  No chief complaint on file.  History of Present Illness: Melinda Stone is a 43 y.o. female who is presenting today for evaluation of elevated ESR.  She reports that she has had issues with her blood levels being low for ~1 year.  She states that issue with her blood counts started this year after she was sick in the hospital w/ influenza/PNA. She admits to issues with recurrent infections. She admits to heavy menstrual cycles.   She admits to shortness of breath. She admits to recent diagnosis of asthma in the past three years. She admits to chronic cough, denies hemoptysis. She denies epistaxis. She denies blood in her urine or stool.  She admits to pain in bilateral lower extremities which she attributes to a disc bulge in her low back and her diabetic neuropathy. She denies any joint pain. She denies any joint swelling. She admits to swelling of her entire lower extremities (not isolated to her joints).   She denies oral ulcers, alopecia, photosensitivity, raynaud's, rashes. She admits to one stillborn and one miscarriage.   She denies alcohol  use, illegal drug use. She admits to prior nicotine  use, quit in 04/2023.  Activities of Daily Living:  Patient reports morning stiffness for 10-15 minutes.   Patient Reports nocturnal pain.  Difficulty dressing/grooming: Reports Difficulty climbing stairs: Reports Difficulty getting out of chair: Reports Difficulty using hands for taps, buttons, cutlery, and/or writing: Denies  Review of Systems  Constitutional:  Positive for fatigue.  HENT:  Negative for mouth sores and mouth dryness.   Eyes:  Negative for dryness.  Respiratory:  Positive for shortness of breath.   Cardiovascular:  Negative for chest  pain and palpitations.  Gastrointestinal:  Positive for diarrhea. Negative for blood in stool and constipation.  Endocrine: Positive for increased urination.  Genitourinary:  Positive for involuntary urination.  Musculoskeletal:  Positive for joint pain, gait problem, joint pain, joint swelling, myalgias, muscle weakness, morning stiffness, muscle tenderness and myalgias.  Skin:  Negative for color change, rash, hair loss and sensitivity to sunlight.  Allergic/Immunologic: Positive for susceptible to infections.  Neurological:  Positive for headaches. Negative for dizziness.  Hematological:  Negative for swollen glands.  Psychiatric/Behavioral:  Positive for sleep disturbance. Negative for depressed mood. The patient is not nervous/anxious.      Rheum History: #N/A   PMFS History:  Patient Active Problem List   Diagnosis Date Noted   Chronic kidney disease, stage 3a (HCC) 11/24/2023   Acute on chronic anemia 11/23/2023   Rhinovirus 11/23/2023   ESR raised 11/05/2023   Leukocytosis 10/29/2023   Tension headache 10/29/2023   Moderate persistent asthma 10/29/2023   Acute sinusitis 10/28/2023   Dysfunctional uterine bleeding 10/01/2023   AKI (acute kidney injury) (HCC) 09/05/2023   Urinary tract infection symptoms 09/05/2023   Thrombocytopenia (HCC) 08/01/2023   Anemia of chronic disease 07/31/2023   Metabolic acidosis 07/31/2023   Menorrhagia 07/31/2023   Prolapsed lumbosacral intervertebral disc 07/31/2023   Essential hypertension 07/31/2023   Anxiety 07/31/2023   DM2 (diabetes mellitus, type 2) (HCC) 07/31/2023   Chlamydia 07/31/2023   Type 2 diabetes mellitus with  retinopathy of left eye, with long-term current use of insulin  (HCC) 06/21/2023   Hepatosplenomegaly 06/14/2023   Respiratory failure (HCC) 05/23/2023   Debility 05/21/2023   Type 2 diabetes mellitus with hypoglycemia without coma (HCC) 05/21/2023   Hyperglycemia 05/15/2023   Acute hypoxic respiratory failure  (HCC) 05/12/2023   Influenza A 05/12/2023   ARDS (adult respiratory distress syndrome) (HCC) 05/12/2023   Pneumonia due to infectious organism 05/12/2023   Nausea and vomiting 05/11/2023   CAP (community acquired pneumonia) 03/26/2023   Sepsis (HCC) 03/26/2023   Cellulitis of fifth toe 03/26/2023   Multifocal pneumonia 03/25/2023   DKA (diabetic ketoacidosis) (HCC) 12/24/2022   DKA, type 2 (HCC) 12/24/2022   Migraine 07/11/2022   Hypokalemia 05/14/2022   Normocytic anemia 05/14/2022   Pneumonitis 05/13/2022   Grade I diastolic dysfunction 05/13/2022   Carpal tunnel syndrome 05/13/2022   Pain of breast 05/13/2022   Contusion of upper limb 05/13/2022   Obesity, class 1 05/13/2022   Overweight (BMI 25.0-29.9) 05/13/2022   Aspirat pneumonitis due to anesth during preg, first tri 05/05/2022   Need for prophylactic vaccination and inoculation against influenza 02/07/2022   Low serum vitamin B12 01/30/2022   IDA (iron  deficiency anemia) 01/30/2022   Mild intermittent asthma 01/15/2022   Esophageal reflux 01/15/2022   Dyspnea 09/01/2021   Pulmonary nodules 09/01/2021   Absolute anemia 09/01/2021   Tobacco use 09/01/2021   Hypotension 09/01/2021   Nausea & vomiting 06/08/2021   UTI (urinary tract infection) 06/08/2021   Post laminectomy syndrome 04/11/2021   HNP (herniated nucleus pulposus), lumbar 11/14/2020   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   Chronic pain syndrome 06/14/2020   Pharmacologic therapy 06/14/2020   Disorder of skeletal system 06/14/2020   Problems influencing health status 06/14/2020   History of marijuana use 06/14/2020   History of illicit drug use 06/14/2020   Abnormal drug screen (05/25/2020) 06/14/2020   Marijuana use 06/14/2020   Abnormal MRI, lumbar spine (05/16/2020) 06/14/2020   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, L5-S1) 06/14/2020   Lumbar lateral recess stenosis (Left: L4-5, L5-S1) 06/14/2020   Lumbar foraminal  stenosis (Bilateral: L4-5) 06/14/2020   S/P lumbar microdiscectomy 06/07/2020   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   GI bleed 12/01/2019   Muscle spasm 11/06/2019   Sciatica 11/06/2019   Low back pain 02/04/2019   Type 2 diabetes mellitus with diabetic polyneuropathy, without long-term current use of insulin  (HCC) 12/03/2018   Type 2 diabetes mellitus with hyperglycemia, without long-term current use of insulin  (HCC) 05/15/2018    Past Medical History:  Diagnosis Date   Anemia    Arthritis 08/17/2020   Chronic back pain    Diabetes mellitus without complication (HCC)    type 2   GERD (gastroesophageal reflux disease)    Hypertension    Migraine    Ovarian cyst    Pneumonia    Polyneuropathy     Family History  Problem Relation Age of Onset   Diabetes Mother    Hypertension Mother    Stroke Mother    Healthy Sister    Hypertension Brother    Asthma Brother    Hypertension Brother    Healthy Son    Healthy Daughter    Healthy Daughter    Past Surgical History:  Procedure Laterality Date   CATARACT EXTRACTION Right 11/08/2023   CESAREAN SECTION     x4   CHOLECYSTECTOMY  2004   EYE SURGERY Left    cataract   EYE  SURGERY Left 10/2023   retinal surgery x2   HYSTEROSCOPY N/A 10/01/2023   Procedure: ABLATION, ENDOMETRIUM, HYSTEROSCOPIC;  Surgeon: Alger Gong, MD;  Location: [facility] OR;  Service: Gynecology;  Laterality: N/A;   IR BONE MARROW BIOPSY & ASPIRATION  08/01/2023   LUMBAR LAMINECTOMY N/A 05/30/2020   Procedure: Lumbar five Laminectomy,  Bilateral Microdiscectomy, Left Lumbar five -Sacral one Microdiscectomy;  Surgeon: Barbarann Oneil BROCKS, MD;  Location: [facility];  Service: Orthopedics;  Laterality: N/A;   LUMBAR LAMINECTOMY Left 11/14/2020   Procedure: LEFT LUMBAR FOUR-FIVE MICRODISCECTOMY;  Surgeon: Barbarann Oneil BROCKS, MD;  Location: [facility];  Service: Orthopedics;  Laterality: Left;   TUBAL LIGATION  10/19/2010   Social History   Social History Narrative    Lives with 3 children   Some coffee   Right handed   Doesn't work   Immunization History  Administered Date(s) Administered   Influenza-Unspecified 12/18/2022   Janssen (J&J) SARS-COV-2 Vaccination 08/25/2019   Pneumococcal Polysaccharide-23 05/09/2020   Tdap 08/04/2022     Objective: Vital Signs: BP (!) 157/90 (BP Location: Right Arm, Patient Position: Sitting, Cuff Size: Large)   Pulse 86   Temp 98.1 F (36.7 C)   Resp 16   Ht 5' 9 (1.753 m)   Wt 221 lb 6.4 oz (100.4 kg)   BMI 32.70 kg/m    Physical Exam Vitals and nursing note reviewed.  HENT:     Head: Normocephalic and atraumatic.     Nose: Nose normal.  Eyes:     Conjunctiva/sclera: Conjunctivae normal.     Pupils: Pupils are equal, round, and reactive to light.  Cardiovascular:     Rate and Rhythm: Normal rate and regular rhythm.     Heart sounds: Normal heart sounds.  Pulmonary:     Effort: Pulmonary effort is normal.     Breath sounds: Normal breath sounds.  Musculoskeletal:     Comments: Numbness to palpation of bilateral feet  Skin:    General: Skin is warm and dry.  Neurological:     Mental Status: She is alert. Mental status is at baseline.  Psychiatric:        Mood and Affect: Mood normal.        Behavior: Behavior normal.      Musculoskeletal Exam:   CDAI Exam: CDAI Score: -- Patient Global: --; Provider Global: -- Swollen: 0 ; Tender: 0  Joint Exam 12/02/2023   All documented joints were normal     Investigation: No additional findings.  Imaging: DG Chest Portable 1 View Result Date: 11/23/2023 CLINICAL DATA:  Shortness of breath. EXAM: PORTABLE CHEST 1 VIEW COMPARISON:  October 28, 2023. FINDINGS: The heart size and mediastinal contours are within normal limits. Both lungs are clear. The visualized skeletal structures are unremarkable. IMPRESSION: No active disease. Electronically Signed   By: Lynwood Landy Raddle M.D.   On: 11/23/2023 10:33    Recent Labs: Lab Results  Component  Value Date   WBC 9.4 11/28/2023   HGB 7.0 (L) 11/28/2023   PLT 179 11/28/2023   NA 134 (L) 11/25/2023   K 5.0 11/25/2023   CL 108 11/25/2023   CO2 17 (L) 11/25/2023   GLUCOSE 319 (H) 11/25/2023   BUN 28 (H) 11/25/2023   CREATININE 1.34 (H) 11/25/2023   BILITOT 1.0 11/23/2023   ALKPHOS 88 11/23/2023   AST 15 11/23/2023   ALT 9 11/23/2023   PROT 5.3 (L) 11/23/2023   ALBUMIN 2.5 (L) 11/23/2023   CALCIUM  8.0 (L) 11/25/2023  GFRAA 139 04/28/2020   Lab Results  Component Value Date   ANA Negative 09/10/2023   RF <10.0 09/10/2023    Speciality Comments: No specialty comments available.  Procedures:  No procedures performed Allergies: Trulicity  [dulaglutide ]   Assessment / Plan:     Visit Diagnoses:  Normocytic anemia Thrombocytopenia, resolved Patient with hx of normocytic anemia requiring multiple blood transfusions. Unclear etiology at this time. She had a negative ANA x 2 in the past, furthermore she has no symptoms concerning for SLE (negative for rash, oral ulcers, alopecia, photosensitivity, raynaud's, inflammatory arthritis) or other rheumatologic disease that would explain her symptoms.   Given negative DAT testing, would consider an autoimmune lymphoproliferative syndrome or alternative causes of non-immune hemolytic anemia such an membranopathies, hemaglobinopathies or RBC enzyme deficiencies.   Elevated ESR CKD An elevated erythrocyte sedimentation rate (ESR) is a nonspecific marker of inflammation. It can be elevated in a variety of conditions, including infections, inflammatory diseases such as rheumatoid arthritis and vasculitis, and malignancies. Non-rheumatic causes of elevated ESR include chronic kidney disease, anemia, and pregnancy. ESR can also be influenced by age and sex. Suspect that patient's elevated ESR is secondary to both CKD and severe anemia requiring multiple transfusions. No evidence of systemic rheumatologic condition that would explain patient's  elevated inflammatory marker at this time.  ILD? Asthma GGO on imaging Patient with hx of asthma, as well as imaging that is suggestive of underlying ILD on multiple studies. At this time, patient has not followed up with pulmonology since before her hospitalization earlier this year where she was intubated. Will place referral today for her to re-establish care.   Orders: Orders Placed This Encounter  Procedures   Ambulatory referral to Pulmonology   No orders of the defined types were placed in this encounter.   I personally spent a total of 60 minutes in the care of the patient today including preparing to see the patient, getting/reviewing separately obtained history, performing a medically appropriate exam/evaluation, counseling and educating, and documenting clinical information in the EHR.  Follow-Up Instructions: Return if symptoms worsen or fail to improve.   Asberry Claw, DO

## 2023-12-03 ENCOUNTER — Ambulatory Visit

## 2023-12-03 ENCOUNTER — Other Ambulatory Visit

## 2023-12-03 ENCOUNTER — Ambulatory Visit: Admitting: Oncology

## 2023-12-04 ENCOUNTER — Other Ambulatory Visit: Payer: Self-pay | Admitting: *Deleted

## 2023-12-04 DIAGNOSIS — D649 Anemia, unspecified: Secondary | ICD-10-CM

## 2023-12-05 ENCOUNTER — Ambulatory Visit

## 2023-12-05 ENCOUNTER — Inpatient Hospital Stay: Admitting: Oncology

## 2023-12-05 ENCOUNTER — Inpatient Hospital Stay

## 2023-12-05 ENCOUNTER — Other Ambulatory Visit: Payer: Self-pay | Admitting: Obstetrics and Gynecology

## 2023-12-05 ENCOUNTER — Encounter: Payer: Self-pay | Admitting: Oncology

## 2023-12-05 VITALS — BP 147/84 | HR 80 | Temp 96.7°F | Resp 18 | Wt 219.9 lb

## 2023-12-05 DIAGNOSIS — R591 Generalized enlarged lymph nodes: Secondary | ICD-10-CM

## 2023-12-05 DIAGNOSIS — D649 Anemia, unspecified: Secondary | ICD-10-CM

## 2023-12-05 DIAGNOSIS — Z23 Encounter for immunization: Secondary | ICD-10-CM

## 2023-12-05 DIAGNOSIS — R700 Elevated erythrocyte sedimentation rate: Secondary | ICD-10-CM | POA: Diagnosis not present

## 2023-12-05 DIAGNOSIS — D519 Vitamin B12 deficiency anemia, unspecified: Secondary | ICD-10-CM

## 2023-12-05 DIAGNOSIS — R162 Hepatomegaly with splenomegaly, not elsewhere classified: Secondary | ICD-10-CM

## 2023-12-05 DIAGNOSIS — E538 Deficiency of other specified B group vitamins: Secondary | ICD-10-CM

## 2023-12-05 DIAGNOSIS — N1831 Chronic kidney disease, stage 3a: Secondary | ICD-10-CM | POA: Diagnosis not present

## 2023-12-05 LAB — CBC WITH DIFFERENTIAL/PLATELET
Abs Immature Granulocytes: 0.02 K/uL (ref 0.00–0.07)
Basophils Absolute: 0.1 K/uL (ref 0.0–0.1)
Basophils Relative: 1 %
Eosinophils Absolute: 0 K/uL (ref 0.0–0.5)
Eosinophils Relative: 0 %
HCT: 26.9 % — ABNORMAL LOW (ref 36.0–46.0)
Hemoglobin: 8.8 g/dL — ABNORMAL LOW (ref 12.0–15.0)
Immature Granulocytes: 0 %
Lymphocytes Relative: 19 %
Lymphs Abs: 1.4 K/uL (ref 0.7–4.0)
MCH: 27.2 pg (ref 26.0–34.0)
MCHC: 32.7 g/dL (ref 30.0–36.0)
MCV: 83.3 fL (ref 80.0–100.0)
Monocytes Absolute: 0.4 K/uL (ref 0.1–1.0)
Monocytes Relative: 5 %
Neutro Abs: 5.7 K/uL (ref 1.7–7.7)
Neutrophils Relative %: 75 %
Platelets: 162 K/uL (ref 150–400)
RBC: 3.23 MIL/uL — ABNORMAL LOW (ref 3.87–5.11)
RDW: 18.6 % — ABNORMAL HIGH (ref 11.5–15.5)
WBC: 7.5 K/uL (ref 4.0–10.5)
nRBC: 0 % (ref 0.0–0.2)

## 2023-12-05 LAB — SAMPLE TO BLOOD BANK

## 2023-12-05 LAB — RETIC PANEL
Immature Retic Fract: 7.1 % (ref 2.3–15.9)
RBC.: 2.8 MIL/uL — ABNORMAL LOW (ref 3.87–5.11)
Retic Count, Absolute: 350.5 K/uL — ABNORMAL HIGH (ref 19.0–186.0)
Retic Ct Pct: 12.5 % — ABNORMAL HIGH (ref 0.4–3.1)
Reticulocyte Hemoglobin: 31 pg (ref >27.9)

## 2023-12-05 IMAGING — CT CT ABD-PELV W/O CM
2 of 4 series · 16 of 46 positions shown, 18 images · non-contrast
Comparison: Renal ultrasound dated 06/09/2021 and CT abdomen pelvis
dated 01/11/2019.

CLINICAL DATA: Urinary tract infection.



[Series 2: axial st · axial · 0.98mm/px · z∈[+1186,+1646]mm · 13 of 104 slices shown, 15 images]
[im 6/104  soft-tissue]
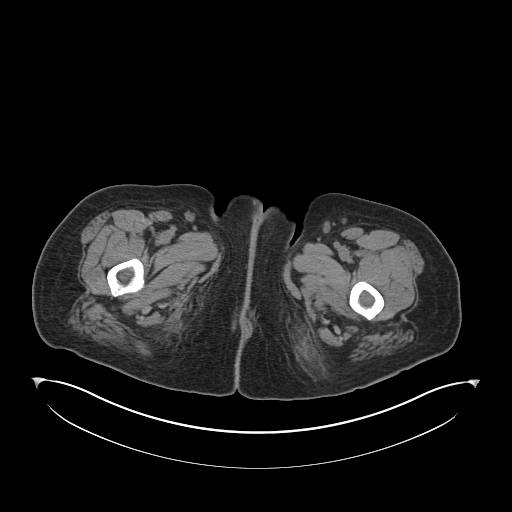
[im 6/104  bone]
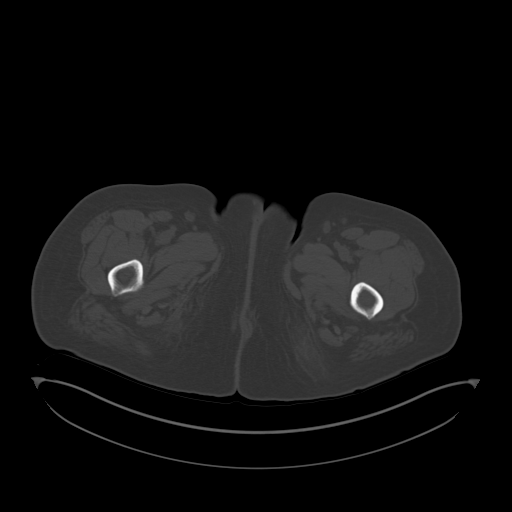
[im 16/104  soft-tissue]
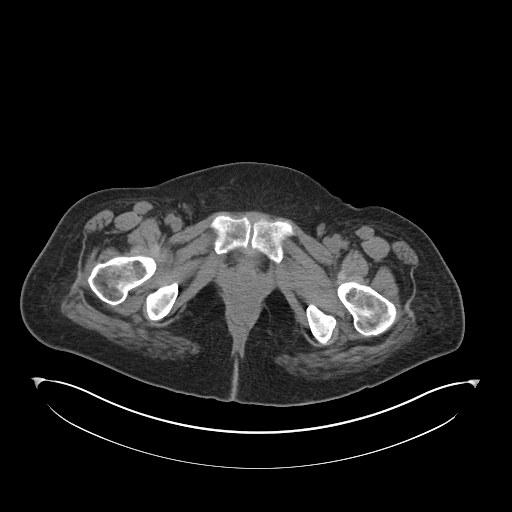
[im 21/104  soft-tissue]
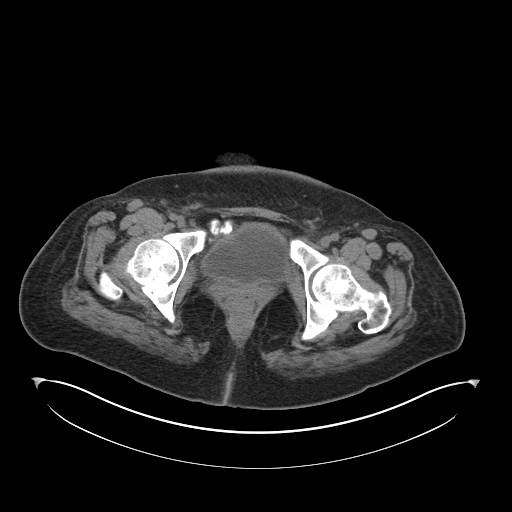
[im 31/104  soft-tissue]
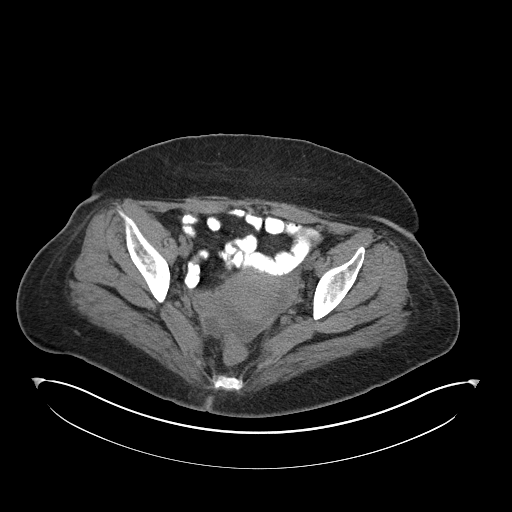
[im 37/104  soft-tissue]
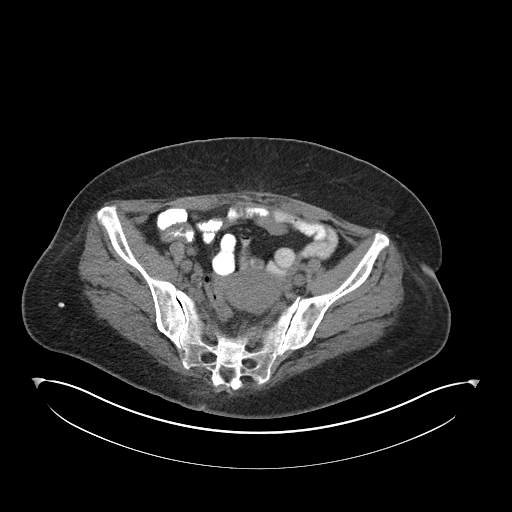
[im 47/104  soft-tissue]
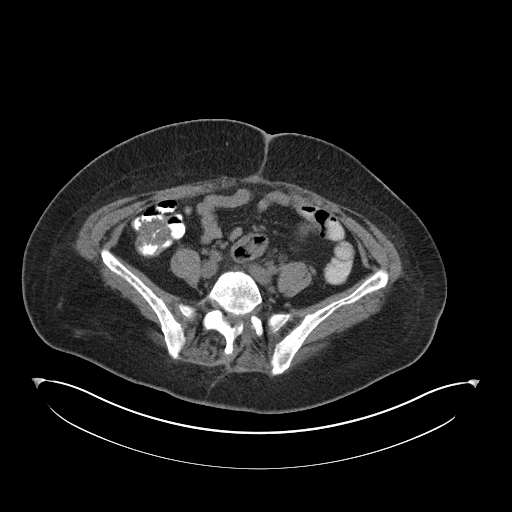
[im 52/104  soft-tissue]
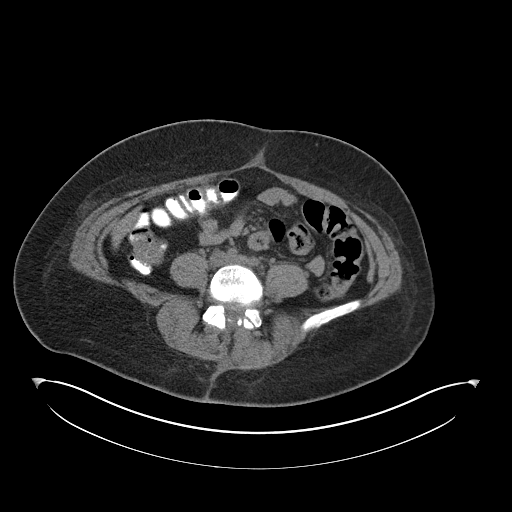
[im 57/104  soft-tissue]
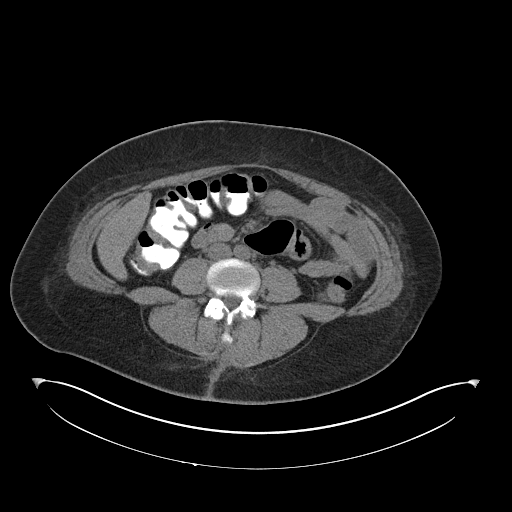
[im 67/104  soft-tissue]
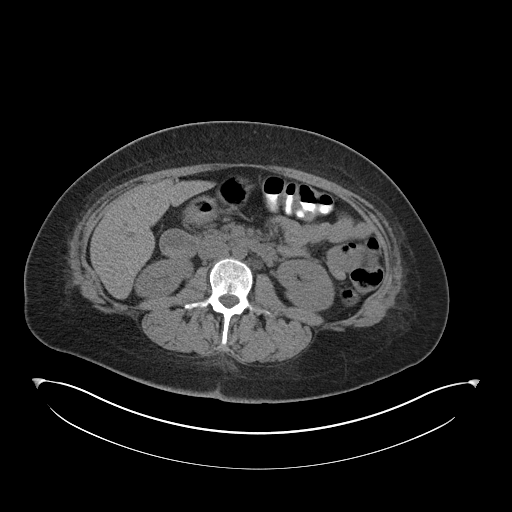
[im 67/104  bone]
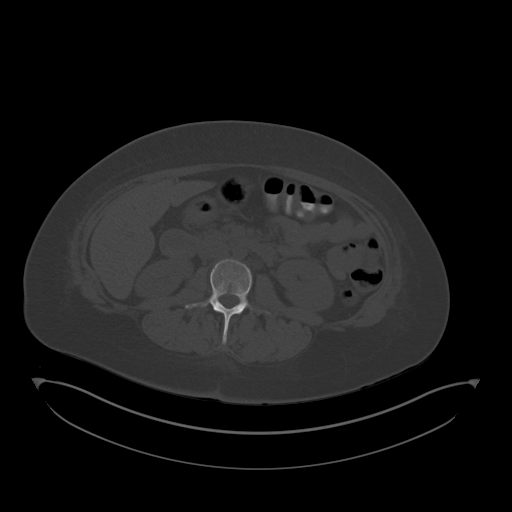
[im 73/104  soft-tissue]
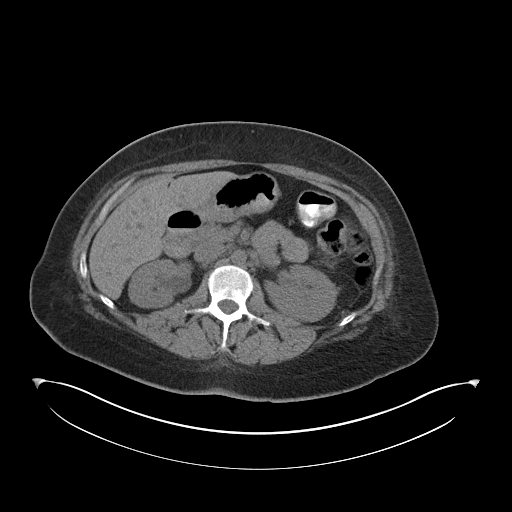
[im 83/104  soft-tissue]
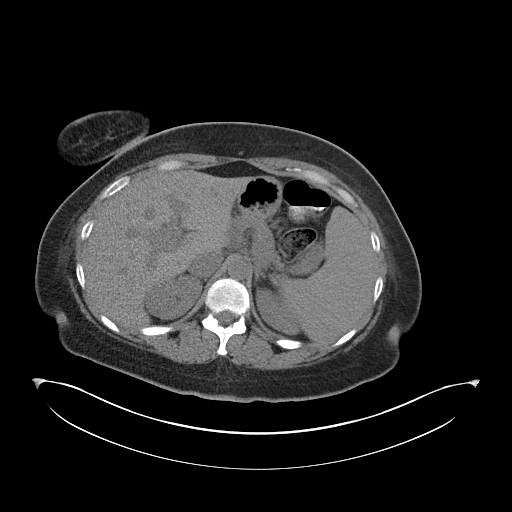
[im 88/104  soft-tissue]
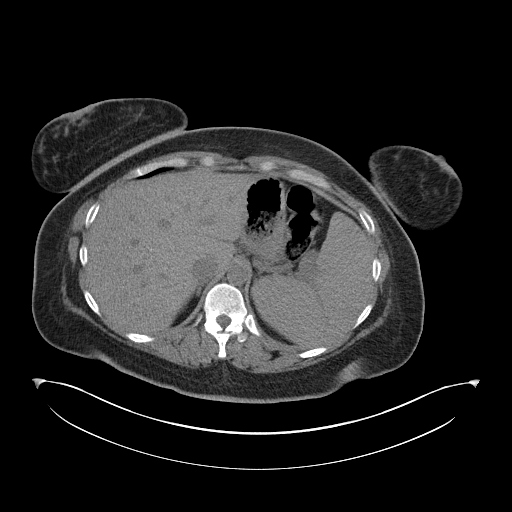
[im 98/104  soft-tissue]
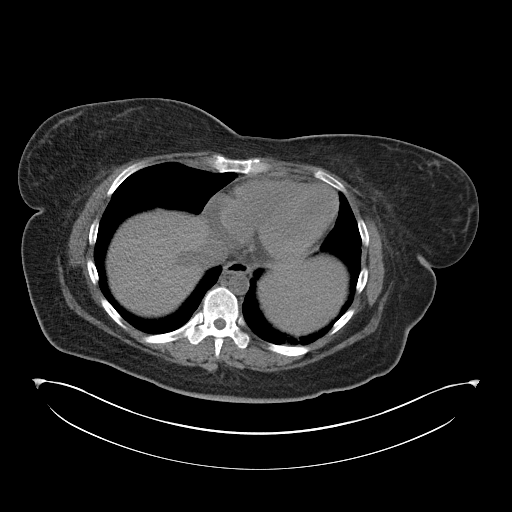

[Series 4: coronal st · coronal · 0.94mm/px · 3 of 162 slices shown]
[im 54/162  soft-tissue]
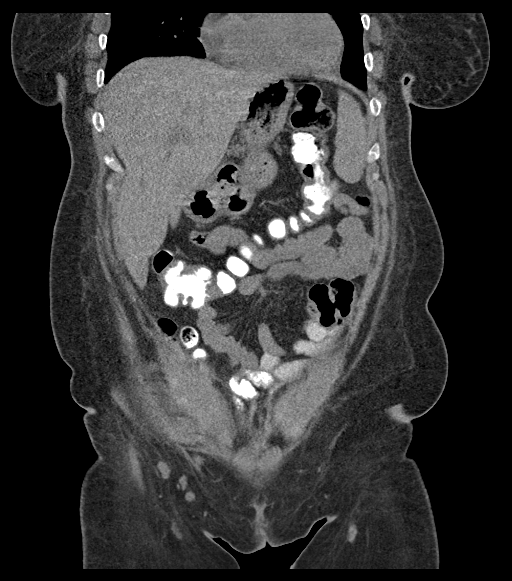
[im 72/162  soft-tissue]
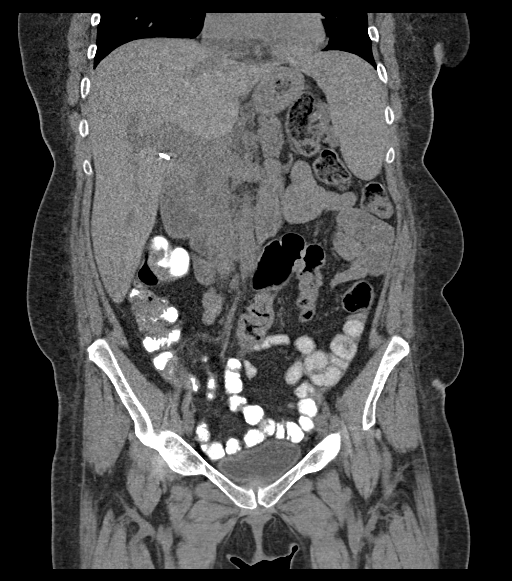
[im 90/162  soft-tissue]
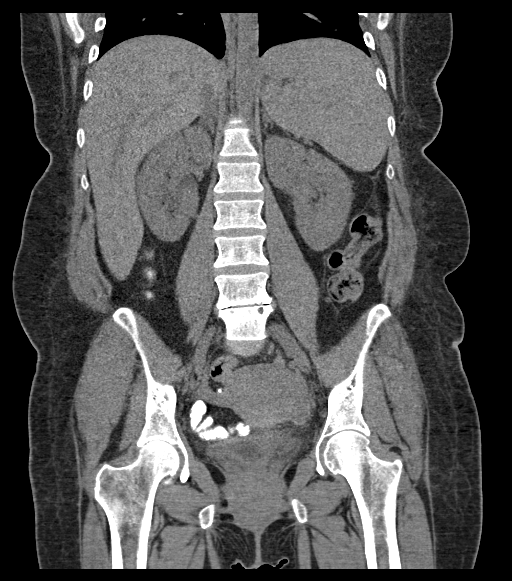

[16 of 46 positions shown; findings below may reference images not displayed]

FINDINGS: Evaluation of this exam is limited in the absence of intravenous
contrast.

Lower chest: Minimal left lung base atelectasis.

No intra-abdominal free air.  Small free fluid within the pelvis.

Hepatobiliary: The liver is unremarkable. No intrahepatic biliary
dilatation. Cholecystectomy. No retained calcified stone noted in
the central CBD.

Pancreas: Unremarkable. No pancreatic ductal dilatation or
surrounding inflammatory changes.

Spleen: Normal in size without focal abnormality.

Adrenals/Urinary Tract: The adrenal glands unremarkable. There is
mild fullness of the right renal pelvis and collecting system. No
stone identified. The left kidney is unremarkable. The visualized
ureters and urinary bladder appear unremarkable.

Stomach/Bowel: No bowel obstruction or active inflammation. The
appendix is normal.

Vascular/Lymphatic: The abdominal aorta and IVC are unremarkable on
this noncontrast CT. No portal venous gas. There is no adenopathy.

Reproductive: The uterus is anteverted and grossly unremarkable. No
adnexal masses.

Other: None

Musculoskeletal: Degenerative changes primarily at L4-L5 with disc
space narrowing and endplate irregularity. No acute osseous
pathology.
IMPRESSION: 1. Mild fullness of the right renal pelvis and collecting system. No
stone identified.
2. No bowel obstruction. Normal appendix.

## 2023-12-05 IMAGING — US US RENAL
1 series · 15 of 25 positions shown · non-contrast
Comparison: CT AP, 01/11/2019 and 01/18/2018.

CLINICAL DATA: Acute renal failure.

EXAM:
RENAL / URINARY TRACT ULTRASOUND COMPLETE

[Series 1: us renal mc & wl · 15 of 42 slices shown]
[im 1/42]
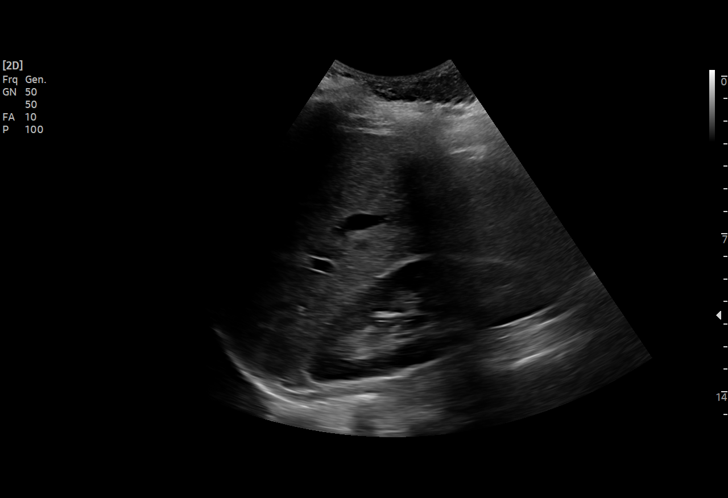
[im 4/42]
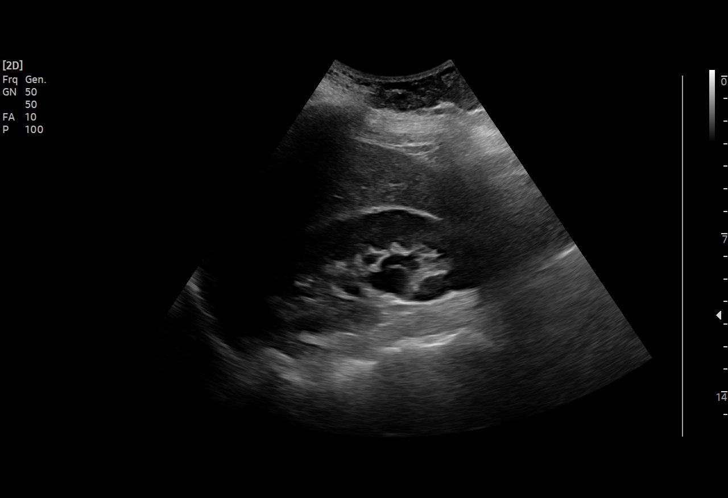
[im 7/42]
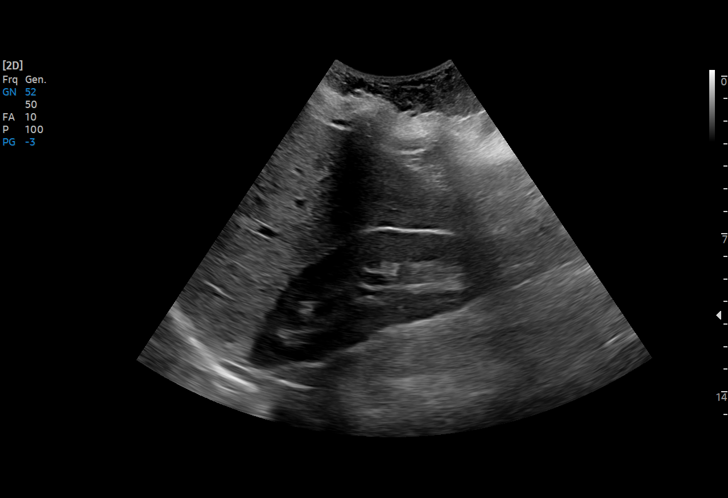
[im 9/42]
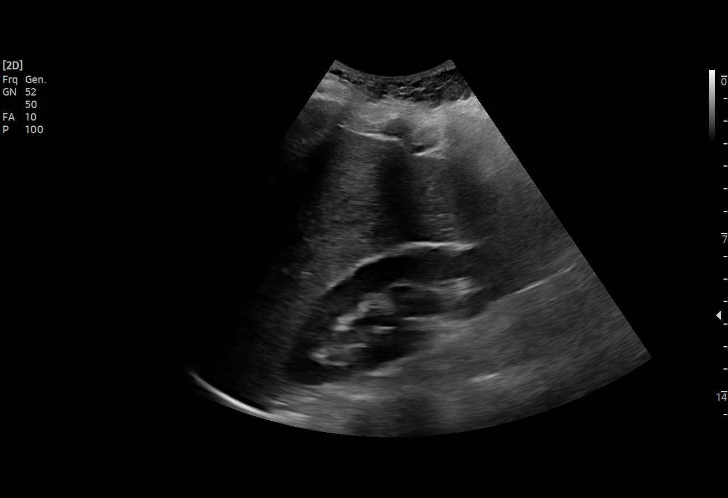
[im 12/42]
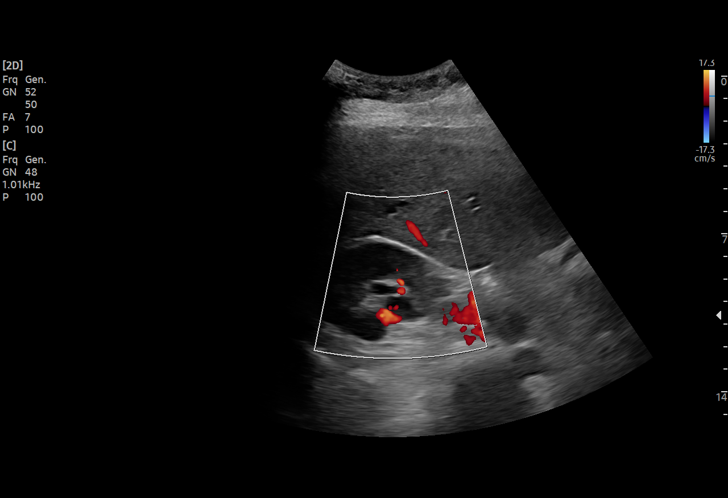
[im 16/42]
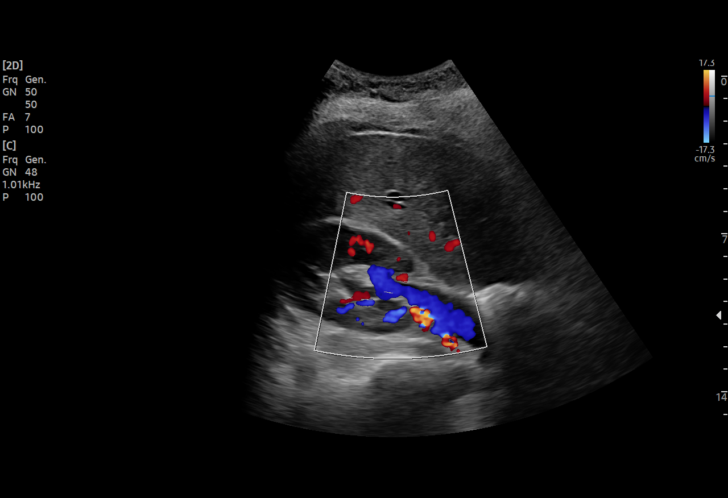
[im 18/42]
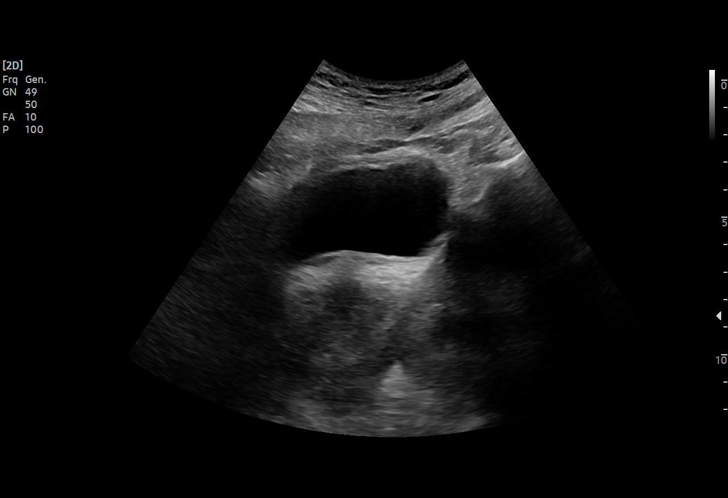
[im 21/42]
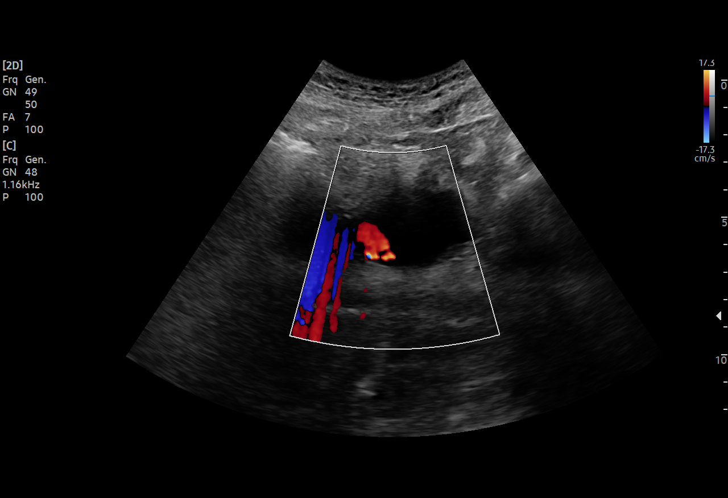
[im 24/42]
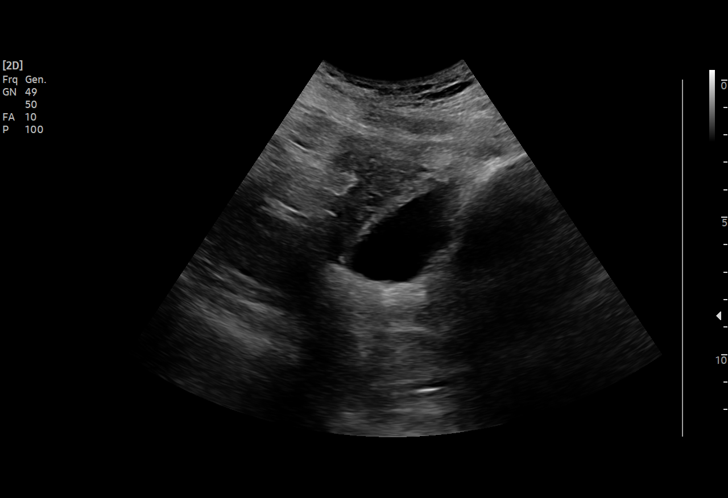
[im 26/42]
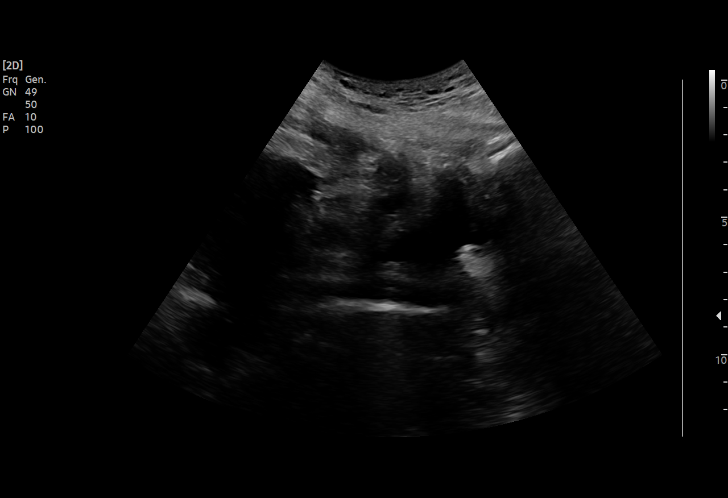
[im 30/42]
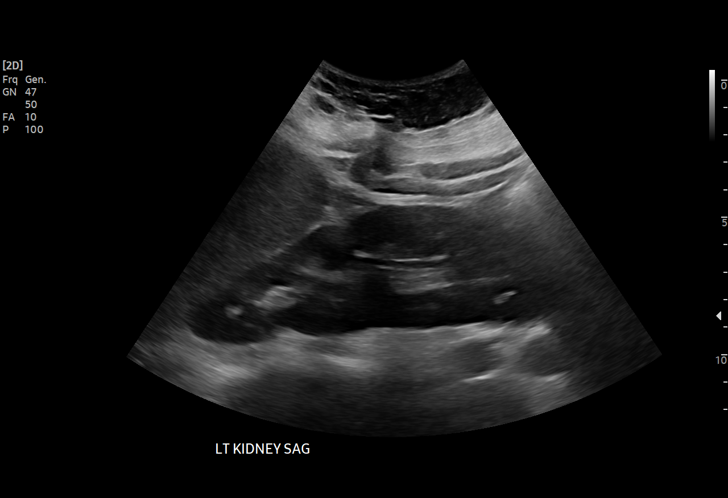
[im 33/42]
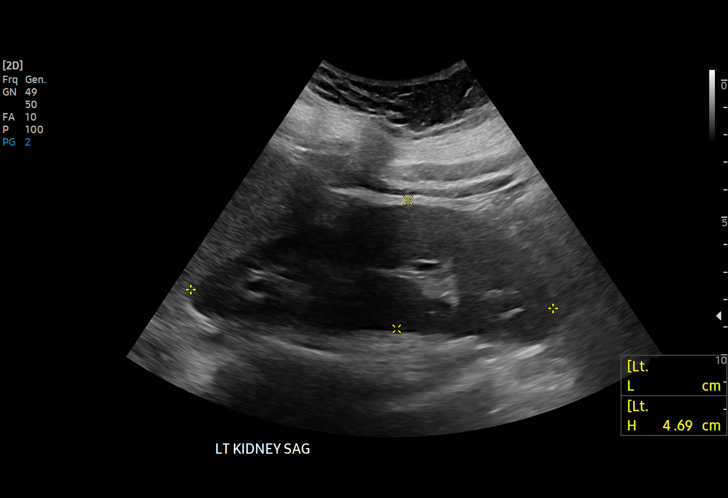
[im 35/42]
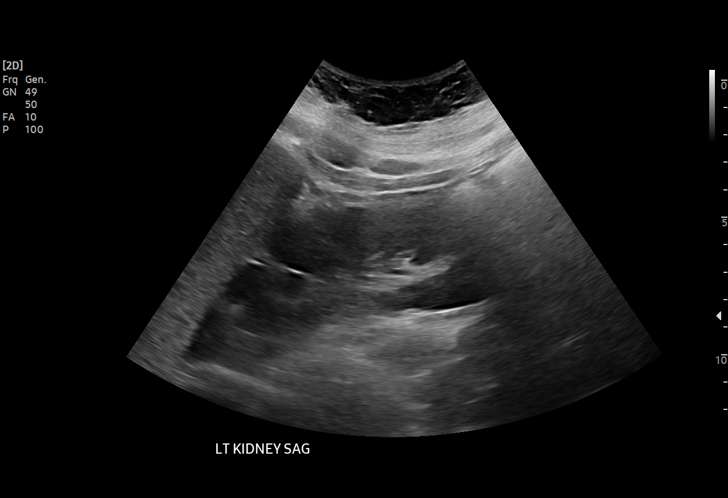
[im 38/42]
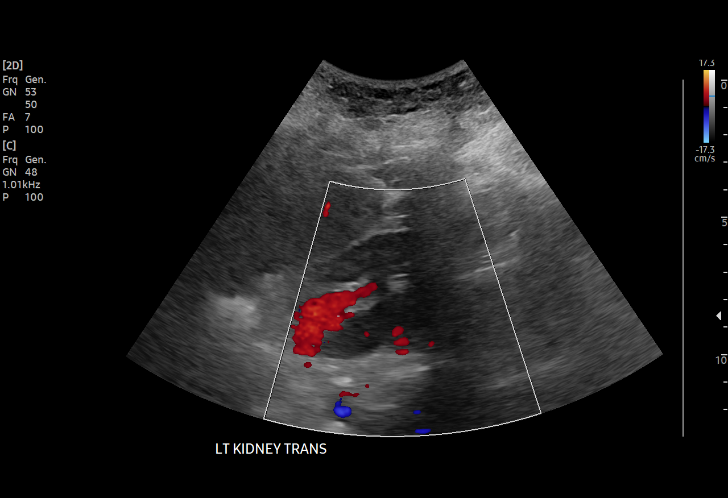
[im 42/42]
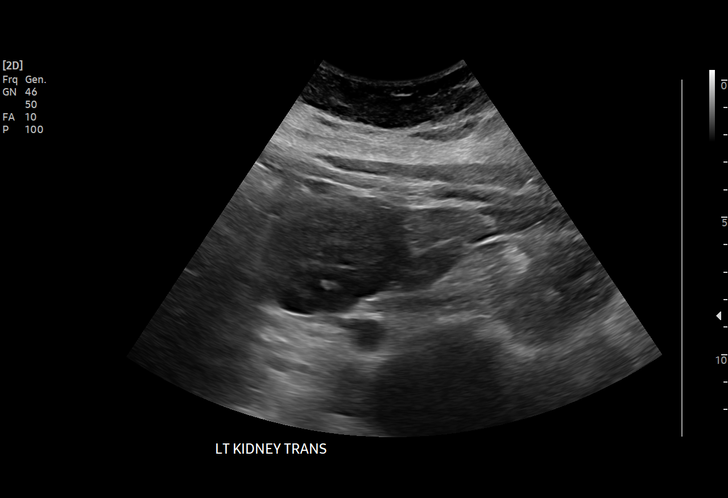

[15 of 25 positions shown; findings below may reference images not displayed]

FINDINGS: Suboptimal evaluation, with poor acoustic penetration secondary to
patient habitus and shadowing from overlying bowel gas.

Right Kidney:

Renal measurements: 11.8 x 4.4 x 4.7 cm = volume: 128 mL.
Echogenicity within normal limits. No mass visualized.

Mild RIGHT hydronephrosis.

Left Kidney:

Renal measurements: 13.2 x 4.7 x 4.8 cm = volume: 155 mL.
Echogenicity within normal limits. No mass or hydronephrosis
visualized.

Bladder:

Incompletely distended, therefore not fully assessed.

Other:

None.
IMPRESSION: Mild RIGHT hydronephrosis.

## 2023-12-05 MED ORDER — EPOETIN ALFA-EPBX 40000 UNIT/ML IJ SOLN
40000.0000 [IU] | Freq: Once | INTRAMUSCULAR | Status: AC
  Administered 2023-12-05: 40000 [IU] via SUBCUTANEOUS
  Filled 2023-12-05: qty 1

## 2023-12-05 NOTE — Assessment & Plan Note (Addendum)
 Previous workup showed low haptoglobin, normal Ldh, normal bilirubin.   No M protein on myeloma panel, negative parvo virus DNA PCR, CMV negative DAT,  negative cold agglutinin. negative PNH. Previous bone marrow biopsy showed Slightly hypercellular bone marrow with erythroid hyperplasia, No viral cytopathic changes or significant dyspoiesis. Suspect secondary process. Normal cytogenetics, normal myeloid NGS   Autoimmune workup showed negative ANA, negative CRP, CCP, positive ESR, Steroid had helped maintaining her hemoglobin and she was transfusion independent from late June 2025 to early August 2025.  However patient has developed side effects including infection and hyperglycemia.  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  Positive ESR, evaluated by rheumatology.   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  Will check hemoglobinopathy, G6PD.  She is currently on Retacrit  40,000 units weekly.  So far she tolerates well. Will continue her weekly labs +/- Retacrit  injections, +/- PRBC transfusion to keep hemoglobin above 7 She lives in [place] and will transfer her care to a local hematologist.

## 2023-12-05 NOTE — Assessment & Plan Note (Addendum)
 Possibly due to fatty liver disease. Negative hepatitis B and C Negative CMV HIV parvovirus Negative BCR ABL1 FISH, JAK2 mutation. ?  Evan syndrome but her workup is not typical for hemolysis - normal bilirubin, LDH, decreased haptoglobin. Obtain CT abdomen pelvis with contrast for further evaluation.

## 2023-12-05 NOTE — Assessment & Plan Note (Signed)
 S/p vitamin B12 injections, she did not tolerate due to muscle cramps.  B12 level stable continue sublingual B12 1000mcg daily.  Repeat B12 level

## 2023-12-05 NOTE — Progress Notes (Signed)
 Hematology/Oncology Progress note [phone] [phone]     Patient Care Team: Melinda Shuck, NP as PCP - General (Nurse Practitioner) Melinda Richardson, DO as PCP - Cardiology (Cardiology) Melinda Call, MD as Consulting Physician (Oncology)  ASSESSMENT & PLAN:   Normocytic anemia Refractory to blood transfusion.  low haptoglobin, normal Ldh, decreased haptoglobin and normal bilirubin.   No M protein on myeloma panel, negative parvo virus DNA PCR, CMV negative DAT,  normal bilirubin, negative PNH Previous bone marrow biopsy showed Slightly hypercellular bone marrow with erythroid hyperplasia, No viral cytopathic changes or significant dyspoiesis. Suspect secondary process. Normal cytogenetics, normal myeloid NGS   Autoimmune workup showed negative ANA, negative CRP, CCP, positive ESR, negative cold agglutinin. Steroid had helped maintaining her hemoglobin and she was transfusion independent from late June 2025 to early August 2025.  However patient has developed side effects including infection and hyperglycemia.  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  Positive ESR, evaluated by rheumatology.   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  Will check hemoglobinopathy, G6PD.  She is currently on Retacrit  40,000 units weekly.  So far she tolerates well. Will continue her weekly labs +/- Retacrit  injections, +/- PRBC transfusion to keep hemoglobin above 7 She lives in [place] and will transfer her care to a local hematologist.  ESR raised Arthralgia, elevated ESR.  She was seen by rheumatology and there is no evidence of systemic rheumatologic condition   Hepatosplenomegaly Possibly due to fatty liver disease. Negative hepatitis B and C Negative CMV HIV parvovirus Negative BCR ABL1 FISH, JAK2 mutation. ?  Evan syndrome but her workup is not typical for hemolysis - normal  bilirubin, LDH, decreased haptoglobin. Obtain CT abdomen pelvis with contrast for further evaluation.   Lymphadenopathy Previous CT in February 2025 showed mediastinal and hilar lymphadenopathy.  CT was done in the context of acute infection.  Likely reactive.  I will repeat CT chest for further evaluation.  Low serum vitamin B12 S/p vitamin B12 injections, she did not tolerate due to muscle cramps.  B12 level stable continue sublingual B12 1000mcg daily.  Repeat B12 level    Orders Placed This Encounter  Procedures   CT CHEST ABDOMEN PELVIS WO CONTRAST    Standing Status:   Future    Expected Date:   12/11/2023    Expiration Date:   12/04/2024    Is patient pregnant?:   No    Preferred imaging location?:   [facility]    If indicated for the ordered procedure, I authorize the administration of oral contrast media per Radiology protocol:   Yes    Does the patient have a contrast media/X-ray dye allergy?:   No   Hemoglobin and Hematocrit (Cancer Center Only)    Standing Status:   Standing    Number of Occurrences:   3    Expiration Date:   12/04/2024   Ambulatory referral to Hematology / Oncology    Referral Priority:   Routine    Referral Type:   Consultation    Referral Reason:   Specialty Services Required    Requested Specialty:   Oncology    Number of Visits Requested:   1   Sample to Blood Bank    Standing Status:   Standing    Number of Occurrences:   3    Expiration Date:   12/04/2024    Follow-up per LOS  All questions were answered.  The patient knows to Stone the clinic with any problems, questions or concerns.  Melinda Cap, MD, PhD [facility] Hematology Oncology 12/05/2023   CHIEF COMPLAINTS/REASON FOR VISIT:  iron  deficiency anemia  HISTORY OF PRESENTING ILLNESS:   Melinda Stone is a  43 y.o.  female presents for iron  deficiency anemia.  Patient had blood work done on 07/13/2021, CBC showed hemoglobin of 9.0, MCV 75.3, ferritin 14, iron   saturation 21, TIBC 455.  Reviewed her previous lab records.  Patient has chronic anemia since at least 2019.  + Nausea, no vomiting.  Chronic diarrhea after each meal.  Unintentional weight loss Patient reports that she has had colonoscopy done in May which did not reveal etiology of diarrhea.  Colonoscopy record was not available to me at the time of dictation. 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  05/11/2023 - 05/23/2023 Patient was hospitalized due to hypoxic respiratory failure, influenza A, ARDS, pneumonia and she was discharged to rehab after hospitalization.  INTERVAL HISTORY Melinda Stone is a 43 y.o. female who has above history reviewed by me today presents for follow up visit for iron  deficiency anemia Chronic fatigue , unchanged. No fever, chills, unintentional weight loss, night sweats.  Status post Hysteroscopy, Dilation and Curettage, Hydrothermal Endometrial Ablation. Endometrial biopsy negative for malignancy.  Patient reports that her hemoglobin stopped for a month and restarted.  This month her menstrual period was heavy again.  Currently off steroids.  Fatigue has improved since blood transfusion last week.     Review of Systems  Constitutional:  Positive for fatigue. Negative for chills and fever.  HENT:   Negative for hearing loss and voice change.   Eyes:  Negative for eye problems.  Respiratory:  Negative for chest tightness, cough and wheezing.   Cardiovascular:  Negative for chest pain.  Gastrointestinal:  Negative for abdominal distention, abdominal pain, blood in stool, diarrhea, nausea and vomiting.  Endocrine: Negative for hot flashes.  Genitourinary:  Negative for difficulty urinating and frequency.   Musculoskeletal:  Positive for arthralgias and back pain.  Skin:  Negative for itching and rash.  Neurological:  Positive for numbness. Negative for extremity weakness and headaches.  Hematological:   Negative for adenopathy.  Psychiatric/Behavioral:  Negative for confusion.     MEDICAL HISTORY:  Past Medical History:  Diagnosis Date   Anemia    Arthritis 08/17/2020   Chronic back pain    Diabetes mellitus without complication (HCC)    type 2   GERD (gastroesophageal reflux disease)    Hypertension    Migraine    Ovarian cyst    Pneumonia    Polyneuropathy     SURGICAL HISTORY: Past Surgical History:  Procedure Laterality Date   CATARACT EXTRACTION Right 11/08/2023   CESAREAN SECTION     x4   CHOLECYSTECTOMY  2004   EYE SURGERY Left    cataract   EYE SURGERY Left 10/2023   retinal surgery x2   HYSTEROSCOPY N/A 10/01/2023   Procedure: ABLATION, ENDOMETRIUM, HYSTEROSCOPIC;  Surgeon: Alger Gong, MD;  Location: [facility] OR;  Service: Gynecology;  Laterality: N/A;   IR BONE MARROW BIOPSY & ASPIRATION  08/01/2023   LUMBAR LAMINECTOMY N/A 05/30/2020   Procedure: Lumbar five Laminectomy,  Bilateral Microdiscectomy, Left Lumbar five -Sacral one Microdiscectomy;  Surgeon: Barbarann Oneil BROCKS, MD;  Location: [facility] OR;  Service: Orthopedics;  Laterality: N/A;   LUMBAR LAMINECTOMY Left 11/14/2020   Procedure: LEFT LUMBAR FOUR-FIVE MICRODISCECTOMY;  Surgeon: Barbarann Oneil  C, MD;  Location: [facility] OR;  Service: Orthopedics;  Laterality: Left;   TUBAL LIGATION  10/19/2010    SOCIAL HISTORY: Social History   Socioeconomic History   Marital status: Single    Spouse name: Not on file   Number of children: 3   Years of education: Not on file   Highest education level: High school graduate  Occupational History    Comment: home maker  Tobacco Use   Smoking status: Former    Types: Cigars    Quit date: 12/23/2021    Years since quitting: 1.9    Passive exposure: Never   Smokeless tobacco: Never   Tobacco comments:    2 cigars daily. Khj 02/12/2023  Vaping Use   Vaping status: Never Used  Substance and Sexual Activity   Alcohol  use: Not Currently   Drug use: Not Currently    Comment: per  patient stopped smoking marijuana Mar 2022   Sexual activity: Yes    Birth control/protection: Surgical  Other Topics Concern   Not on file  Social History Narrative   Lives with 3 children   Some coffee   Right handed   Doesn't work   Social Drivers of Corporate investment banker Strain: Not on file  Food Insecurity: No Food Insecurity (11/23/2023)   Hunger Vital Sign    Worried About Running Out of Food in the Last Year: Never true    Ran Out of Food in the Last Year: Never true  Transportation Needs: No Transportation Needs (11/23/2023)   PRAPARE - Administrator, Civil Service (Medical): No    Lack of Transportation (Non-Medical): No  Physical Activity: Not on file  Stress: Not on file  Social Connections: Moderately Isolated (07/31/2023)   Social Connection and Isolation Panel    Frequency of Communication with Friends and Family: Twice a week    Frequency of Social Gatherings with Friends and Family: Twice a week    Attends Religious Services: 1 to 4 times per year    Active Member of [employer] or Organizations: No    Attends Banker Meetings: Never    Marital Status: Never married  Intimate Partner Violence: Not At Risk (11/23/2023)   Humiliation, Afraid, Rape, and Kick questionnaire    Fear of Current or Ex-Partner: No    Emotionally Abused: No    Physically Abused: No    Sexually Abused: No    FAMILY HISTORY: Family History  Problem Relation Age of Onset   Diabetes Mother    Hypertension Mother    Stroke Mother    Healthy Sister    Hypertension Brother    Asthma Brother    Hypertension Brother    Healthy Son    Healthy Daughter    Healthy Daughter     ALLERGIES:  is allergic to trulicity  [facility] ].  MEDICATIONS:  Current Outpatient Medications  Medication Sig Dispense Refill   acetaminophen  (TYLENOL ) 500 MG tablet Take 1,000 mg by mouth every 6 (six) hours as needed for mild pain (pain score 1-3) or moderate pain (pain score 4-6).      albuterol  (VENTOLIN  HFA) 108 (90 Base) MCG/ACT inhaler Inhale 2 puffs into the lungs every 6 (six) hours as needed for wheezing or shortness of breath. 18 g 2   amitriptyline  (ELAVIL ) 10 MG tablet Take 10 mg by mouth at bedtime.     amLODipine  (NORVASC ) 5 MG tablet Take 1 tablet (5 mg total) by mouth daily. 30 tablet 0  brimonidine  (ALPHAGAN ) 0.2 % ophthalmic solution Place 1 drop into the left eye 2 (two) times daily.     Continuous Glucose Sensor (DEXCOM G7 SENSOR) MISC 1 Device by Does not apply route as directed. Change sensor every 10 days 9 each 3   Cyanocobalamin  (VITAMIN B-12) 2500 MCG SUBL Place 1 tablet (2,500 mcg total) under the tongue daily. 30 tablet 2   dorzolamide -timolol  (COSOPT ) 2-0.5 % ophthalmic solution Place 1 drop into the left eye 2 (two) times daily.     ferrous sulfate  325 (65 FE) MG tablet Take 1 tablet (325 mg total) by mouth daily with breakfast. 30 tablet 0   fluticasone  (FLONASE ) 50 MCG/ACT nasal spray Place 2 sprays into both nostrils daily. 16 g 0   furosemide  (LASIX ) 20 MG tablet TAKE 1 TABLET(20 MG) BY MOUTH IN THE MORNING 90 tablet 2   gabapentin  (NEURONTIN ) 800 MG tablet Take 800 mg by mouth 3 (three) times daily.     insulin  glargine (LANTUS  SOLOSTAR) 100 UNIT/ML Solostar Pen Inject 45 Units into the skin daily before breakfast. 45 mL 3   insulin  lispro (HUMALOG  KWIKPEN) 100 UNIT/ML KwikPen Inject 12-22 Units into the skin 3 (three) times daily. Max daily 60 mL 4   Insulin  Pen Needle 32G X 4 MM MISC 1 Device by Does not apply route in the morning, at noon, in the evening, and at bedtime. 400 each 3   ketorolac  (ACULAR ) 0.5 % ophthalmic solution Place 1 drop into the right eye 4 (four) times daily.     metoprolol  tartrate (LOPRESSOR ) 25 MG tablet Take 1 tablet (25 mg total) by mouth 2 (two) times daily. 60 tablet 0   mometasone -formoterol  (DULERA ) 100-5 MCG/ACT AERO Inhale 2 puffs into the lungs 2 (two) times daily at 10 AM and 5 PM. 13 g 0   Multiple  Vitamin (MULTIVITAMIN WITH MINERALS) TABS tablet Take 1 tablet by mouth daily.     ofloxacin  (OCUFLOX ) 0.3 % ophthalmic solution Place 1 drop into the right eye 4 (four) times daily.     oxyCODONE -acetaminophen  (PERCOCET) 7.5-325 MG tablet Take 1 tablet by mouth 4 (four) times daily as needed for moderate pain (pain score 4-6).     pantoprazole  (PROTONIX ) 40 MG tablet Take 1 tablet (40 mg total) by mouth daily. 30 tablet 0   prednisoLONE  acetate (PRED FORTE ) 1 % ophthalmic suspension Place 1 drop into the right eye 4 (four) times daily.     Rimegepant Sulfate (NURTEC) 75 MG TBDP Take 1 tablet (75 mg total) by mouth as needed. Take 1 tablet at onset of headache, max is 1 tablet in 24 hours. 8 tablet 11   tiZANidine  (ZANAFLEX ) 4 MG tablet Take 4 mg by mouth every 12 (twelve) hours as needed for muscle spasms.     vitamin C  (ASCORBIC ACID ) 250 MG tablet Take 2 tablets (500 mg total) by mouth daily. 180 tablet 0   Galcanezumab -gnlm (EMGALITY ) 120 MG/ML SOAJ Inject 120 mg into the skin every 30 (thirty) days. (Patient not taking: Reported on 12/05/2023) 1.12 mL 11   Insulin  Disposable Pump (OMNIPOD 5 G7 PODS, GEN 5,) MISC 1 Device by Does not apply route every other day. (Patient not taking: Reported on 12/05/2023) 45 each 3   No current facility-administered medications for this visit.     PHYSICAL EXAMINATION: ECOG PERFORMANCE STATUS: 2 - Symptomatic, <50% confined to bed Vitals:   12/05/23 1354 12/05/23 1402  BP: (!) 153/85 (!) 147/84  Pulse: 80   Resp: 18  Temp: (!) 96.7 F (35.9 C)   SpO2: 100%    Filed Weights   12/05/23 1354  Weight: 219 lb 14.4 oz (99.7 kg)      Physical Exam Constitutional:      General: She is not in acute distress. HENT:     Head: Normocephalic and atraumatic.  Eyes:     General: No scleral icterus. Cardiovascular:     Rate and Rhythm: Normal rate and regular rhythm.     Heart sounds: Normal heart sounds.  Pulmonary:     Effort: Pulmonary effort is  normal. No respiratory distress.     Breath sounds: No wheezing.  Abdominal:     General: Bowel sounds are normal. There is no distension.     Palpations: Abdomen is soft.  Musculoskeletal:        General: Normal range of motion.     Cervical back: Normal range of motion and neck supple.  Skin:    General: Skin is warm and dry.     Findings: No erythema or rash.  Neurological:     Mental Status: She is alert and oriented to person, place, and time. Mental status is at baseline.  Psychiatric:        Mood and Affect: Mood normal.     LABORATORY DATA:  I have reviewed the data as listed    Latest Ref Rng & Units 12/05/2023    1:34 PM 11/28/2023    1:13 PM 11/25/2023   10:17 AM  CBC  WBC 4.0 - 10.5 K/uL 7.5  9.4    Hemoglobin 12.0 - 15.0 g/dL 8.8  7.0  7.0   Hematocrit 36.0 - 46.0 % 26.9  20.4  21.1   Platelets 150 - 400 K/uL 162  179        Latest Ref Rng & Units 11/25/2023   10:17 AM 11/24/2023    4:03 PM 11/24/2023    1:48 AM  CMP  Glucose 70 - 99 mg/dL 680  733  685   BUN 6 - 20 mg/dL 28  30  34   Creatinine 0.44 - 1.00 mg/dL 8.65  8.55  8.18   Sodium 135 - 145 mmol/L 134  132  134   Potassium 3.5 - 5.1 mmol/L 5.0  5.4  5.4   Chloride 98 - 111 mmol/L 108  110  108   CO2 22 - 32 mmol/L 17  16  16    Calcium  8.9 - 10.3 mg/dL 8.0  8.0  7.7      Iron /TIBC/Ferritin/ %Sat    Component Value Date/Time   IRON  96 11/24/2023 0148   IRON  64 01/02/2019 0955   TIBC 248 (L) 11/24/2023 0148   TIBC 382 01/02/2019 0955   FERRITIN 466 (H) 11/24/2023 0148   FERRITIN 21 01/02/2019 0955   IRONPCTSAT 39 (H) 11/24/2023 0148   IRONPCTSAT 17 01/02/2019 0955      RADIOGRAPHIC STUDIES: I have personally reviewed the radiological images as listed and agreed with the findings in the report. DG Chest Portable 1 View Result Date: 11/23/2023 CLINICAL DATA:  Shortness of breath. EXAM: PORTABLE CHEST 1 VIEW COMPARISON:  October 28, 2023. FINDINGS: The heart size and mediastinal contours are within  normal limits. Both lungs are clear. The visualized skeletal structures are unremarkable. IMPRESSION: No active disease. Electronically Signed   By: Lynwood Landy Raddle M.D.   On: 11/23/2023 10:33

## 2023-12-05 NOTE — Assessment & Plan Note (Signed)
 Previous CT in February 2025 showed mediastinal and hilar lymphadenopathy.  CT was done in the context of acute infection.  Likely reactive.  I will repeat CT chest for further evaluation.

## 2023-12-05 NOTE — Assessment & Plan Note (Addendum)
 Arthralgia, elevated ESR.  She was seen by rheumatology and there is no evidence of systemic rheumatologic condition

## 2023-12-06 ENCOUNTER — Other Ambulatory Visit (HOSPITAL_COMMUNITY): Payer: Self-pay

## 2023-12-06 ENCOUNTER — Inpatient Hospital Stay

## 2023-12-06 ENCOUNTER — Telehealth: Payer: Self-pay | Admitting: Pharmacist

## 2023-12-06 NOTE — Telephone Encounter (Signed)
 Pharmacy Patient Advocate Encounter   Received notification from Patient Pharmacy that prior authorization for Nurtec 75MG  dispersible tablets is required/requested.   Insurance verification completed.   The patient is insured through [facility] .   Per test claim: PA required; PA submitted to above mentioned insurance via Latent Key/confirmation #/EOC [id number] Status is pending

## 2023-12-06 NOTE — Telephone Encounter (Signed)
 Pharmacy Patient Advocate Encounter  Received notification from [employer] that Prior Authorization for Nurtec 75mg  has been APPROVED from 12/06/2023 to 03/18/2024   PA #/Case ID/Reference #: [id number]

## 2023-12-07 IMAGING — DX DG ABDOMEN 1V
2 series · 2 of 2 positions shown · non-contrast
Comparison: CT abdomen pelvis dated 06/09/2021.

CLINICAL DATA: Abdominal pain with nausea and vomiting for 1 month

EXAM:
ABDOMEN - 1 VIEW

[abdomen kub (1 of 2)]
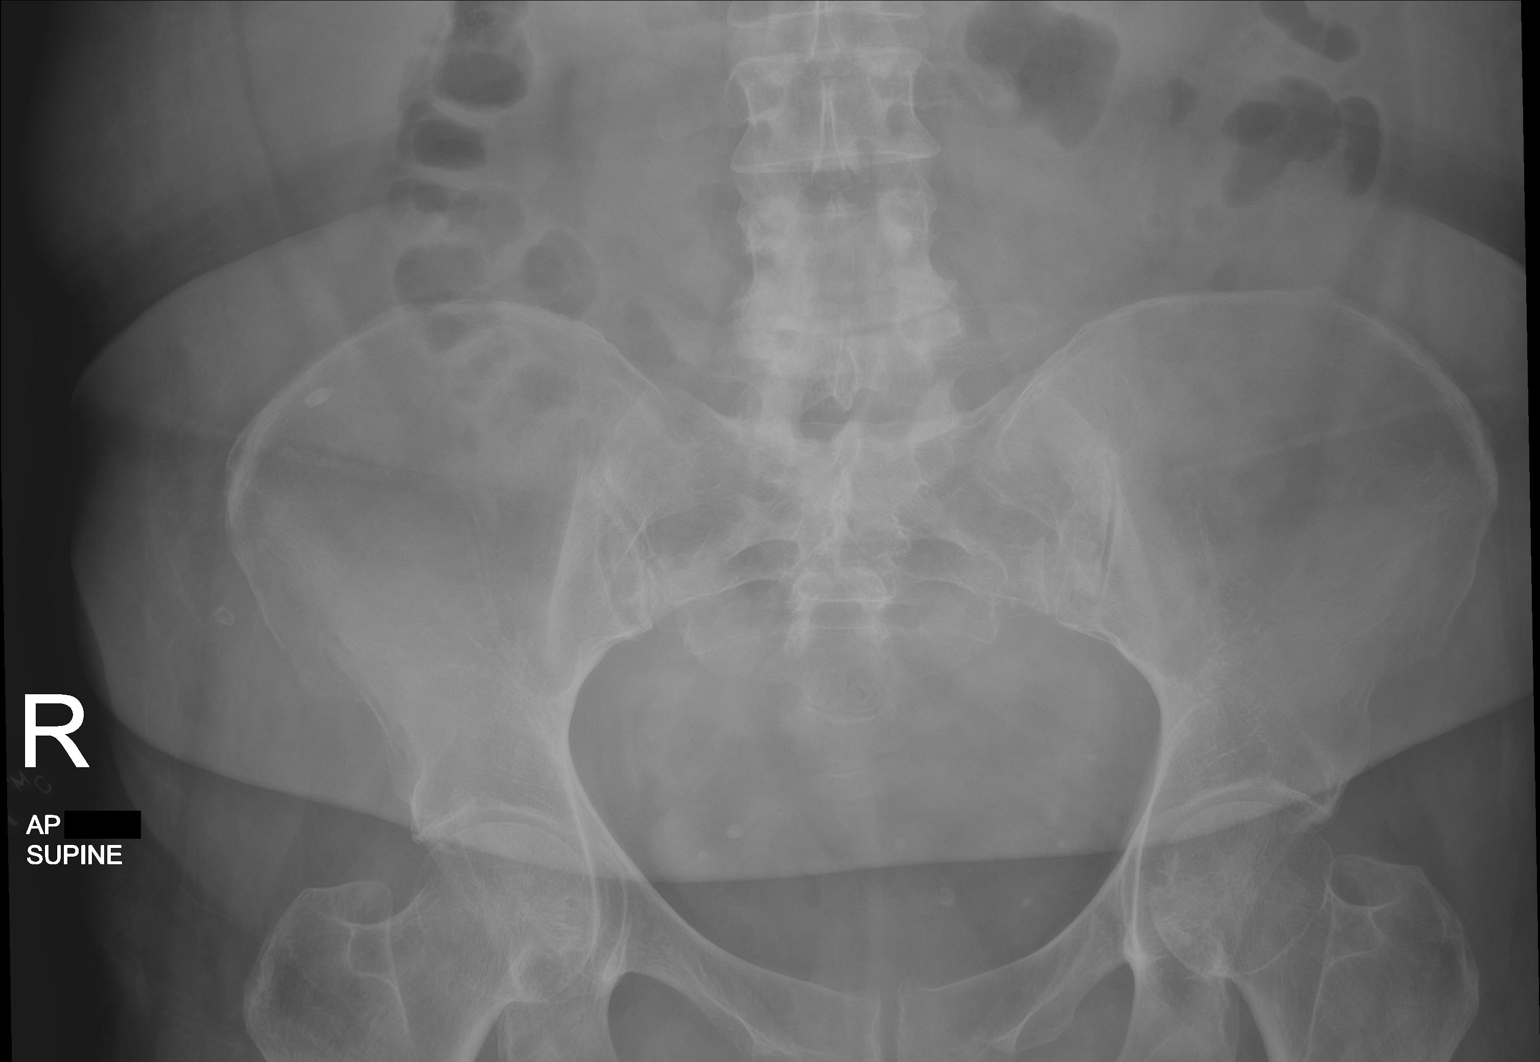

[abdomen kub (2 of 2)]
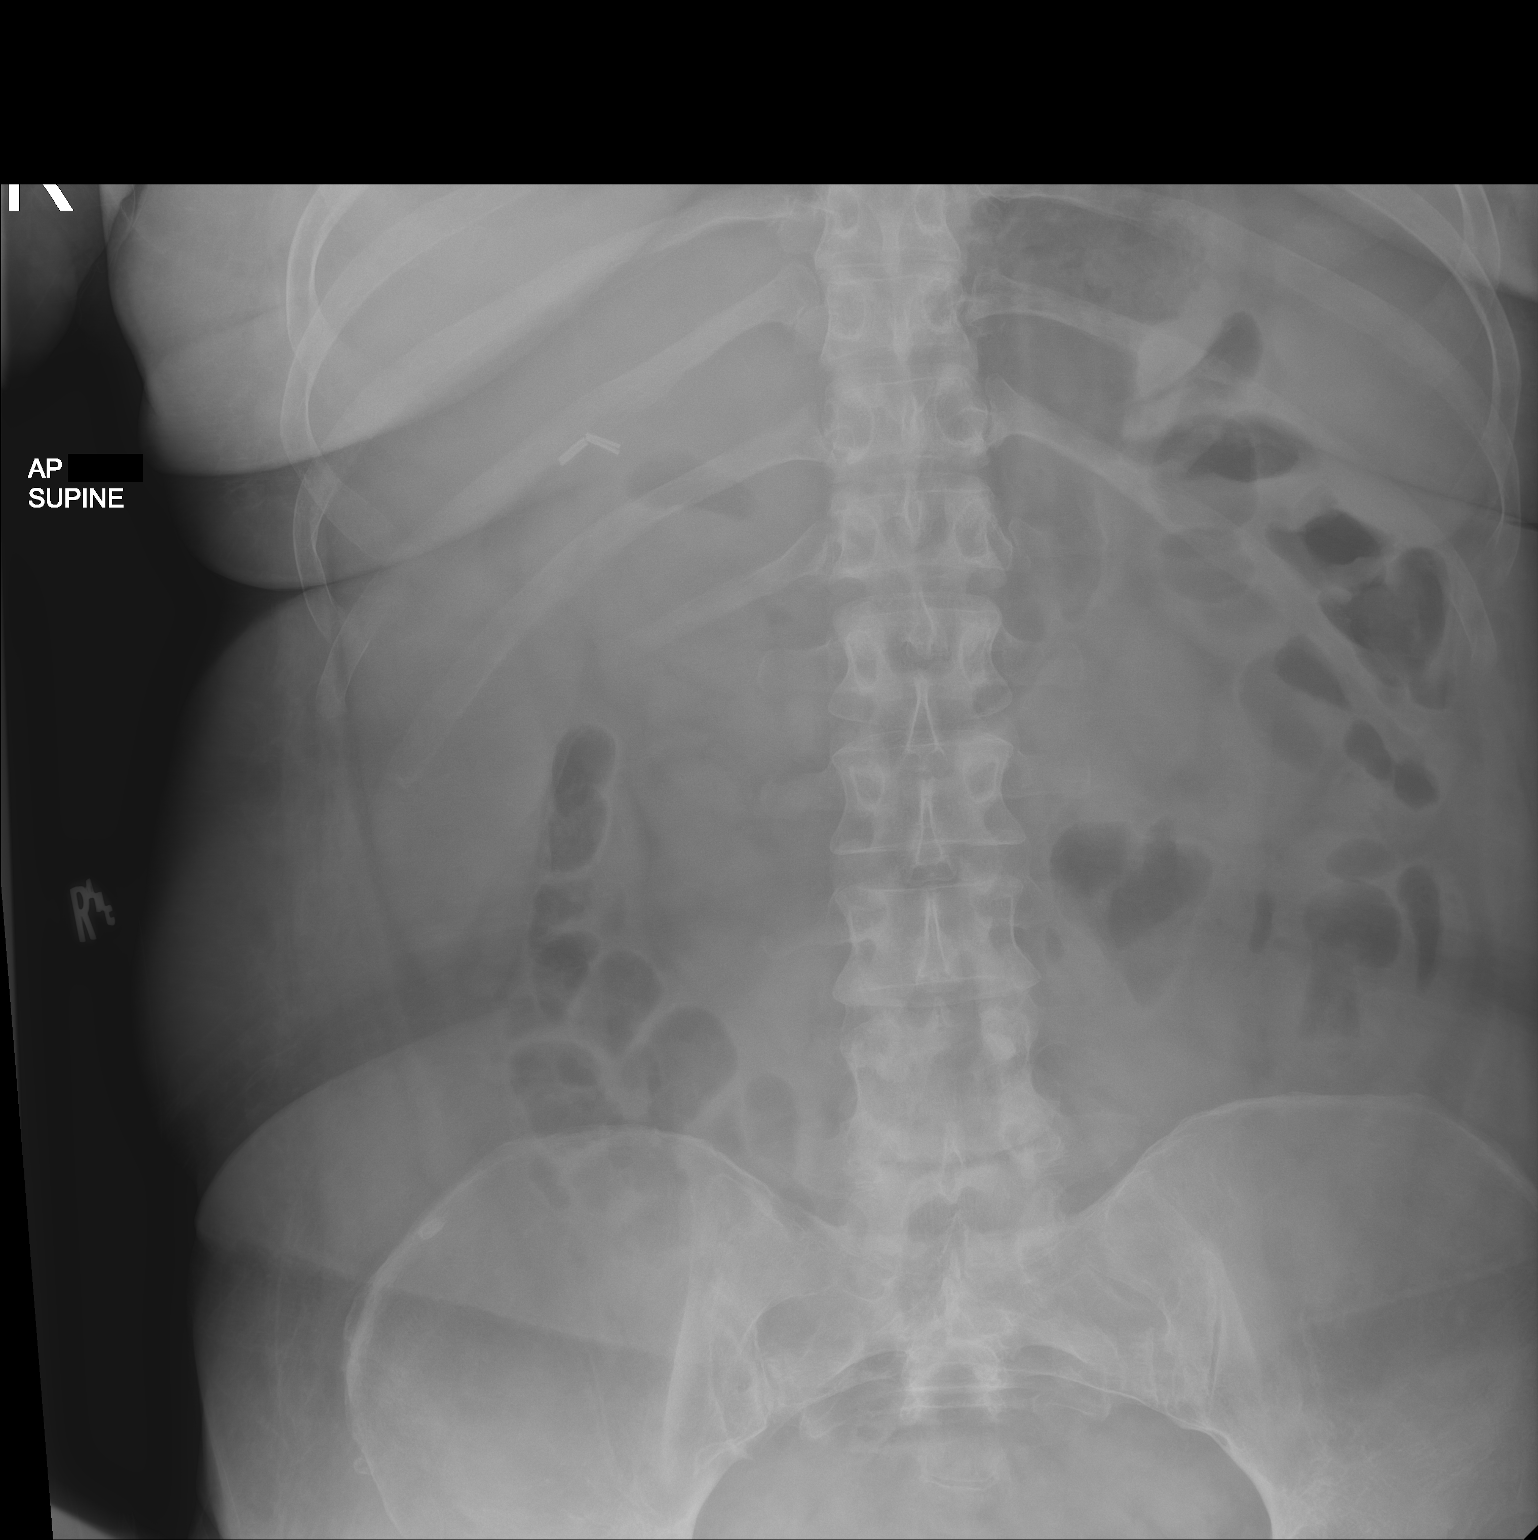

[2 of 2 positions shown; findings below may reference images not displayed]

FINDINGS: The bowel gas pattern is normal. Air-fluid levels and free
intraperitoneal air cannot be excluded on the supine exam. No
radio-opaque calculi or other significant radiographic abnormality
are seen.
IMPRESSION: Nonobstructive bowel gas pattern.

## 2023-12-09 ENCOUNTER — Telehealth: Payer: Self-pay | Admitting: Oncology

## 2023-12-09 NOTE — Telephone Encounter (Signed)
 Called pt to set up CT for this week - left vm for a return call - LH

## 2023-12-10 ENCOUNTER — Other Ambulatory Visit: Payer: Self-pay

## 2023-12-10 MED ORDER — NURTEC 75 MG PO TBDP: 75.0000 mg | ORAL_TABLET | ORAL | 11 refills | Status: DC | PRN

## 2023-12-11 ENCOUNTER — Inpatient Hospital Stay
Admission: RE | Admit: 2023-12-11 | Discharge: 2023-12-11 | Discharge status: Home / Self Care | Source: Ambulatory Visit | Attending: Obstetrics and Gynecology | Admitting: Obstetrics and Gynecology

## 2023-12-11 DIAGNOSIS — Z1231 Encounter for screening mammogram for malignant neoplasm of breast: Secondary | ICD-10-CM

## 2023-12-12 ENCOUNTER — Inpatient Hospital Stay

## 2023-12-12 ENCOUNTER — Ambulatory Visit
Admission: RE | Admit: 2023-12-12 | Discharge: 2023-12-12 | Disposition: A | Discharge status: Home / Self Care | Source: Ambulatory Visit | Attending: Oncology | Admitting: Oncology

## 2023-12-12 DIAGNOSIS — R911 Solitary pulmonary nodule: Secondary | ICD-10-CM | POA: Diagnosis not present

## 2023-12-12 DIAGNOSIS — R161 Splenomegaly, not elsewhere classified: Secondary | ICD-10-CM | POA: Insufficient documentation

## 2023-12-12 DIAGNOSIS — D649 Anemia, unspecified: Secondary | ICD-10-CM | POA: Diagnosis present

## 2023-12-12 DIAGNOSIS — R590 Localized enlarged lymph nodes: Secondary | ICD-10-CM | POA: Diagnosis not present

## 2023-12-12 DIAGNOSIS — N1831 Chronic kidney disease, stage 3a: Secondary | ICD-10-CM | POA: Diagnosis not present

## 2023-12-12 DIAGNOSIS — D519 Vitamin B12 deficiency anemia, unspecified: Secondary | ICD-10-CM

## 2023-12-12 LAB — VITAMIN B12: Vitamin B-12: 484 pg/mL (ref 180–914)

## 2023-12-12 LAB — CBC (CANCER CENTER ONLY)
HCT: 30.7 % — ABNORMAL LOW (ref 36.0–46.0)
Hemoglobin: 10.4 g/dL — ABNORMAL LOW (ref 12.0–15.0)
MCH: 28 pg (ref 26.0–34.0)
MCHC: 33.9 g/dL (ref 30.0–36.0)
MCV: 82.7 fL (ref 80.0–100.0)
Platelet Count: 158 K/uL (ref 150–400)
RBC: 3.71 MIL/uL — ABNORMAL LOW (ref 3.87–5.11)
RDW: 18.6 % — ABNORMAL HIGH (ref 11.5–15.5)
WBC Count: 7.9 K/uL (ref 4.0–10.5)
nRBC: 0 % (ref 0.0–0.2)

## 2023-12-12 LAB — SAMPLE TO BLOOD BANK

## 2023-12-12 NOTE — Progress Notes (Signed)
 Parameter:   Hold Retacrit  if Hb >10   Hgb 10.4 today, nor retacrit  inj given.

## 2023-12-13 ENCOUNTER — Inpatient Hospital Stay

## 2023-12-14 LAB — GLUCOSE 6 PHOSPHATE DEHYDROGENASE
G6PDH: 16.8 U/g{Hb} — ABNORMAL HIGH (ref 4.7–14.6)
Hemoglobin: 9 g/dL — ABNORMAL LOW (ref 11.1–15.9)

## 2023-12-15 LAB — HGB FRACTIONATION CASCADE
Hgb A2: 2.8 % (ref 1.8–3.2)
Hgb A: 96.7 % (ref 96.4–98.8)
Hgb F: 0.5 % (ref 0.0–2.0)
Hgb S: 0 %

## 2023-12-16 ENCOUNTER — Other Ambulatory Visit (HOSPITAL_COMMUNITY): Payer: Self-pay

## 2023-12-16 ENCOUNTER — Telehealth: Payer: Self-pay

## 2023-12-16 NOTE — Telephone Encounter (Signed)
 Pharmacy Patient Advocate Encounter   Received notification from Fax that prior authorization for Emgality  is required/requested.   Insurance verification completed.   The patient is insured through [facility] .   Per test claim: PA required; PA submitted to above mentioned insurance via Latent Key/confirmation #/EOC [facility] Status is pending

## 2023-12-18 NOTE — Telephone Encounter (Signed)
 Pharmacy Patient Advocate Encounter  Received notification from [employer] that Prior Authorization for Emgality  has been APPROVED from 12/16/2023 to 03/17/2024   PA #/Case ID/Reference #: [id number]

## 2023-12-19 ENCOUNTER — Inpatient Hospital Stay

## 2023-12-19 ENCOUNTER — Inpatient Hospital Stay: Attending: Oncology

## 2023-12-19 VITALS — BP 125/79

## 2023-12-19 DIAGNOSIS — E1122 Type 2 diabetes mellitus with diabetic chronic kidney disease: Secondary | ICD-10-CM | POA: Diagnosis not present

## 2023-12-19 DIAGNOSIS — Z23 Encounter for immunization: Secondary | ICD-10-CM

## 2023-12-19 DIAGNOSIS — D631 Anemia in chronic kidney disease: Secondary | ICD-10-CM | POA: Insufficient documentation

## 2023-12-19 DIAGNOSIS — N920 Excessive and frequent menstruation with regular cycle: Secondary | ICD-10-CM | POA: Diagnosis not present

## 2023-12-19 DIAGNOSIS — D649 Anemia, unspecified: Secondary | ICD-10-CM

## 2023-12-19 DIAGNOSIS — D589 Hereditary hemolytic anemia, unspecified: Secondary | ICD-10-CM | POA: Insufficient documentation

## 2023-12-19 DIAGNOSIS — N1831 Chronic kidney disease, stage 3a: Secondary | ICD-10-CM | POA: Diagnosis present

## 2023-12-19 DIAGNOSIS — Z87891 Personal history of nicotine dependence: Secondary | ICD-10-CM | POA: Diagnosis not present

## 2023-12-19 DIAGNOSIS — D500 Iron deficiency anemia secondary to blood loss (chronic): Secondary | ICD-10-CM | POA: Insufficient documentation

## 2023-12-19 DIAGNOSIS — D519 Vitamin B12 deficiency anemia, unspecified: Secondary | ICD-10-CM

## 2023-12-19 LAB — CBC (CANCER CENTER ONLY)
HCT: 27.1 % — ABNORMAL LOW (ref 36.0–46.0)
Hemoglobin: 9.4 g/dL — ABNORMAL LOW (ref 12.0–15.0)
MCH: 27.3 pg (ref 26.0–34.0)
MCHC: 34.7 g/dL (ref 30.0–36.0)
MCV: 78.8 fL — ABNORMAL LOW (ref 80.0–100.0)
Platelet Count: 134 K/uL — ABNORMAL LOW (ref 150–400)
RBC: 3.44 MIL/uL — ABNORMAL LOW (ref 3.87–5.11)
RDW: 17 % — ABNORMAL HIGH (ref 11.5–15.5)
WBC Count: 7.2 K/uL (ref 4.0–10.5)
nRBC: 0 % (ref 0.0–0.2)

## 2023-12-19 LAB — SAMPLE TO BLOOD BANK

## 2023-12-19 MED ORDER — EPOETIN ALFA-EPBX 40000 UNIT/ML IJ SOLN
40000.0000 [IU] | Freq: Once | INTRAMUSCULAR | Status: AC
  Administered 2023-12-19: 40000 [IU] via SUBCUTANEOUS
  Filled 2023-12-19: qty 1

## 2023-12-19 NOTE — Progress Notes (Signed)
 Hgb 9.4 today, injection given today.  Blood transfusion scheduled for tomorrow cancelled.

## 2023-12-20 ENCOUNTER — Inpatient Hospital Stay

## 2023-12-23 ENCOUNTER — Telehealth: Payer: Self-pay

## 2023-12-23 NOTE — Telephone Encounter (Signed)
 Left message for patient about upcoming appointment on 10/7

## 2023-12-24 ENCOUNTER — Inpatient Hospital Stay

## 2023-12-24 ENCOUNTER — Inpatient Hospital Stay (HOSPITAL_BASED_OUTPATIENT_CLINIC_OR_DEPARTMENT_OTHER): Admitting: Hematology and Oncology

## 2023-12-24 VITALS — BP 146/60 | HR 85 | Temp 97.9°F | Resp 18 | Wt 218.1 lb

## 2023-12-24 DIAGNOSIS — D589 Hereditary hemolytic anemia, unspecified: Secondary | ICD-10-CM | POA: Diagnosis not present

## 2023-12-24 DIAGNOSIS — D649 Anemia, unspecified: Secondary | ICD-10-CM

## 2023-12-24 DIAGNOSIS — N920 Excessive and frequent menstruation with regular cycle: Secondary | ICD-10-CM | POA: Diagnosis not present

## 2023-12-24 DIAGNOSIS — D509 Iron deficiency anemia, unspecified: Secondary | ICD-10-CM

## 2023-12-24 DIAGNOSIS — N189 Chronic kidney disease, unspecified: Secondary | ICD-10-CM

## 2023-12-24 DIAGNOSIS — E1122 Type 2 diabetes mellitus with diabetic chronic kidney disease: Secondary | ICD-10-CM

## 2023-12-24 DIAGNOSIS — N1831 Chronic kidney disease, stage 3a: Secondary | ICD-10-CM | POA: Diagnosis not present

## 2023-12-24 DIAGNOSIS — Z87891 Personal history of nicotine dependence: Secondary | ICD-10-CM

## 2023-12-24 LAB — CBC WITH DIFFERENTIAL/PLATELET
Abs Immature Granulocytes: 0.03 K/uL (ref 0.00–0.07)
Basophils Absolute: 0.1 K/uL (ref 0.0–0.1)
Basophils Relative: 1 %
Eosinophils Absolute: 0 K/uL (ref 0.0–0.5)
Eosinophils Relative: 0 %
HCT: 28.1 % — ABNORMAL LOW (ref 36.0–46.0)
Hemoglobin: 10 g/dL — ABNORMAL LOW (ref 12.0–15.0)
Immature Granulocytes: 0 %
Lymphocytes Relative: 20 %
Lymphs Abs: 1.6 K/uL (ref 0.7–4.0)
MCH: 27.6 pg (ref 26.0–34.0)
MCHC: 35.6 g/dL (ref 30.0–36.0)
MCV: 77.6 fL — ABNORMAL LOW (ref 80.0–100.0)
Monocytes Absolute: 0.5 K/uL (ref 0.1–1.0)
Monocytes Relative: 6 %
Neutro Abs: 5.8 K/uL (ref 1.7–7.7)
Neutrophils Relative %: 73 %
Platelets: 183 K/uL (ref 150–400)
RBC: 3.62 MIL/uL — ABNORMAL LOW (ref 3.87–5.11)
RDW: 17 % — ABNORMAL HIGH (ref 11.5–15.5)
WBC: 8 K/uL (ref 4.0–10.5)
nRBC: 0 % (ref 0.0–0.2)

## 2023-12-24 LAB — PROTIME-INR
INR: 1 (ref 0.8–1.2)
Prothrombin Time: 13.5 s (ref 11.4–15.2)

## 2023-12-24 LAB — SAMPLE TO BLOOD BANK

## 2023-12-24 LAB — CMP (CANCER CENTER ONLY)
ALT: 9 U/L (ref 0–44)
AST: 15 U/L (ref 15–41)
Albumin: 3.8 g/dL (ref 3.5–5.0)
Alkaline Phosphatase: 153 U/L — ABNORMAL HIGH (ref 38–126)
Anion gap: 10 (ref 5–15)
BUN: 21 mg/dL — ABNORMAL HIGH (ref 6–20)
CO2: 18 mmol/L — ABNORMAL LOW (ref 22–32)
Calcium: 9 mg/dL (ref 8.9–10.3)
Chloride: 105 mmol/L (ref 98–111)
Creatinine: 1.09 mg/dL — ABNORMAL HIGH (ref 0.44–1.00)
GFR, Estimated: >60 mL/min (ref >60)
Glucose, Bld: 387 mg/dL — ABNORMAL HIGH (ref 70–99)
Potassium: 6.1 mmol/L — ABNORMAL HIGH (ref 3.5–5.1)
Sodium: 133 mmol/L — ABNORMAL LOW (ref 135–145)
Total Bilirubin: 0.9 mg/dL (ref 0.0–1.2)
Total Protein: 6.2 g/dL — ABNORMAL LOW (ref 6.5–8.1)

## 2023-12-24 LAB — RETICULOCYTES
Immature Retic Fract: 15.7 % (ref 2.3–15.9)
RBC.: 3.6 MIL/uL — ABNORMAL LOW (ref 3.87–5.11)
Retic Count, Absolute: 232.9 K/uL — ABNORMAL HIGH (ref 19.0–186.0)
Retic Ct Pct: 6.5 % — ABNORMAL HIGH (ref 0.4–3.1)

## 2023-12-24 LAB — VITAMIN B12: Vitamin B-12: 646 pg/mL (ref 180–914)

## 2023-12-24 LAB — APTT: aPTT: 25 s (ref 24–36)

## 2023-12-24 LAB — LACTATE DEHYDROGENASE: LDH: 218 U/L — ABNORMAL HIGH (ref 98–192)

## 2023-12-24 LAB — FERRITIN: Ferritin: 647 ng/mL — ABNORMAL HIGH (ref 11–307)

## 2023-12-24 NOTE — Progress Notes (Unsigned)
 Hematology/Oncology Progress note [phone] [phone]     Patient Care Team: [name], NP as PCP - General (Nurse Practitioner) Michele Richardson, DO as PCP - Cardiology (Cardiology) Babara Call, MD as Consulting Physician (Oncology)  ASSESSMENT & PLAN:   No problem-specific Assessment & Plan notes found for this encounter.    No orders of the defined types were placed in this encounter.   Follow-up per LOS  All questions were answered. The patient knows to call the clinic with any problems, questions or concerns.  Call Babara, MD, PhD [facility] Hematology Oncology 12/24/2023   CHIEF COMPLAINTS/REASON FOR VISIT:  iron  deficiency anemia  HISTORY OF PRESENTING ILLNESS:   Melinda Stone is a  43 y.o.  female presents for iron  deficiency anemia.    Review of Systems  Constitutional:  Positive for fatigue. Negative for chills and fever.  HENT:   Negative for hearing loss and voice change.   Eyes:  Negative for eye problems.  Respiratory:  Negative for chest tightness, cough and wheezing.   Cardiovascular:  Negative for chest pain.  Gastrointestinal:  Negative for abdominal distention, abdominal pain, blood in stool, diarrhea, nausea and vomiting.  Endocrine: Negative for hot flashes.  Genitourinary:  Negative for difficulty urinating and frequency.   Musculoskeletal:  Positive for arthralgias and back pain.  Skin:  Negative for itching and rash.  Neurological:  Positive for numbness. Negative for extremity weakness and headaches.  Hematological:  Negative for adenopathy.  Psychiatric/Behavioral:  Negative for confusion.     MEDICAL HISTORY:  Past Medical History:  Diagnosis Date  . Anemia   . Arthritis 08/17/2020  . Chronic back pain   . Diabetes mellitus without complication (HCC)    type 2  . GERD (gastroesophageal reflux disease)   . Hypertension   . Migraine   . Ovarian cyst   . Pneumonia   . Polyneuropathy     SURGICAL  HISTORY: Past Surgical History:  Procedure Laterality Date  . CATARACT EXTRACTION Right 11/08/2023  . CESAREAN SECTION     x4  . CHOLECYSTECTOMY  2004  . EYE SURGERY Left    cataract  . EYE SURGERY Left 10/2023   retinal surgery x2  . HYSTEROSCOPY N/A 10/01/2023   Procedure: ABLATION, ENDOMETRIUM, HYSTEROSCOPIC;  Surgeon: Alger Gong, MD;  Location: [facility] OR;  Service: Gynecology;  Laterality: N/A;  . IR BONE MARROW BIOPSY & ASPIRATION  08/01/2023  . LUMBAR LAMINECTOMY N/A 05/30/2020   Procedure: Lumbar five Laminectomy,  Bilateral Microdiscectomy, Left Lumbar five -Sacral one Microdiscectomy;  Surgeon: Barbarann Oneil BROCKS, MD;  Location: [facility] OR;  Service: Orthopedics;  Laterality: N/A;  . LUMBAR LAMINECTOMY Left 11/14/2020   Procedure: LEFT LUMBAR FOUR-FIVE MICRODISCECTOMY;  Surgeon: Barbarann Oneil BROCKS, MD;  Location: [facility] OR;  Service: Orthopedics;  Laterality: Left;  . TUBAL LIGATION  10/19/2010    SOCIAL HISTORY: Social History   Socioeconomic History  . Marital status: Single    Spouse name: Not on file  . Number of children: 3  . Years of education: Not on file  . Highest education level: High school graduate  Occupational History    Comment: home maker  Tobacco Use  . Smoking status: Former    Types: Cigars    Quit date: 12/23/2021    Years since quitting: 2.0    Passive exposure: Never  . Smokeless tobacco: Never  . Tobacco comments:    2 cigars daily. Khj 02/12/2023  Vaping Use  . Vaping status: Never  Used  Substance and Sexual Activity  . Alcohol  use: Not Currently  . Drug use: Not Currently    Comment: per patient stopped smoking marijuana Mar 2022  . Sexual activity: Yes    Birth control/protection: Surgical  Other Topics Concern  . Not on file  Social History Narrative   Lives with 3 children   Some coffee   Right handed   Doesn't work   Social Drivers of Corporate investment banker Strain: Not on file  Food Insecurity: No Food Insecurity (11/23/2023)    Hunger Vital Sign   . Worried About Programme researcher, broadcasting/film/video in the Last Year: Never true   . Ran Out of Food in the Last Year: Never true  Transportation Needs: No Transportation Needs (11/23/2023)   PRAPARE - Transportation   . Lack of Transportation (Medical): No   . Lack of Transportation (Non-Medical): No  Physical Activity: Not on file  Stress: Not on file  Social Connections: Moderately Isolated (07/31/2023)   Social Connection and Isolation Panel   . Frequency of Communication with Friends and Family: Twice a week   . Frequency of Social Gatherings with Friends and Family: Twice a week   . Attends Religious Services: 1 to 4 times per year   . Active Member of Clubs or Organizations: No   . Attends Banker Meetings: Never   . Marital Status: Never married  Intimate Partner Violence: Not At Risk (11/23/2023)   Humiliation, Afraid, Rape, and Kick questionnaire   . Fear of Current or Ex-Partner: No   . Emotionally Abused: No   . Physically Abused: No   . Sexually Abused: No    FAMILY HISTORY: Family History  Problem Relation Age of Onset  . Diabetes Mother   . Hypertension Mother   . Stroke Mother   . Healthy Sister   . Hypertension Brother   . Asthma Brother   . Hypertension Brother   . Healthy Son   . Healthy Daughter   . Healthy Daughter     ALLERGIES:  is allergic to trulicity  [dulaglutide ].  MEDICATIONS:  Current Outpatient Medications  Medication Sig Dispense Refill  . acetaminophen  (TYLENOL ) 500 MG tablet Take 1,000 mg by mouth every 6 (six) hours as needed for mild pain (pain score 1-3) or moderate pain (pain score 4-6).    . albuterol  (VENTOLIN  HFA) 108 (90 Base) MCG/ACT inhaler Inhale 2 puffs into the lungs every 6 (six) hours as needed for wheezing or shortness of breath. 18 g 2  . amitriptyline  (ELAVIL ) 10 MG tablet Take 10 mg by mouth at bedtime.    . amLODipine  (NORVASC ) 5 MG tablet Take 1 tablet (5 mg total) by mouth daily. 30 tablet 0  .  brimonidine  (ALPHAGAN ) 0.2 % ophthalmic solution Place 1 drop into the left eye 2 (two) times daily.    . Continuous Glucose Sensor (DEXCOM G7 SENSOR) MISC 1 Device by Does not apply route as directed. Change sensor every 10 days 9 each 3  . Cyanocobalamin  (VITAMIN B-12) 2500 MCG SUBL Place 1 tablet (2,500 mcg total) under the tongue daily. 30 tablet 2  . dorzolamide -timolol  (COSOPT ) 2-0.5 % ophthalmic solution Place 1 drop into the left eye 2 (two) times daily.    . ferrous sulfate  325 (65 FE) MG tablet Take 1 tablet (325 mg total) by mouth daily with breakfast. 30 tablet 0  . fluticasone  (FLONASE ) 50 MCG/ACT nasal spray Place 2 sprays into both nostrils daily. 16 g 0  .  furosemide  (LASIX ) 20 MG tablet TAKE 1 TABLET(20 MG) BY MOUTH IN THE MORNING 90 tablet 2  . gabapentin  (NEURONTIN ) 800 MG tablet Take 800 mg by mouth 3 (three) times daily.    . Galcanezumab -gnlm (EMGALITY ) 120 MG/ML SOAJ Inject 120 mg into the skin every 30 (thirty) days. (Patient not taking: Reported on 12/05/2023) 1.12 mL 11  . Insulin  Disposable Pump (OMNIPOD 5 G7 PODS, GEN 5,) MISC 1 Device by Does not apply route every other day. (Patient not taking: Reported on 12/05/2023) 45 each 3  . insulin  glargine (LANTUS  SOLOSTAR) 100 UNIT/ML Solostar Pen Inject 45 Units into the skin daily before breakfast. 45 mL 3  . insulin  lispro (HUMALOG  KWIKPEN) 100 UNIT/ML KwikPen Inject 12-22 Units into the skin 3 (three) times daily. Max daily 60 mL 4  . Insulin  Pen Needle 32G X 4 MM MISC 1 Device by Does not apply route in the morning, at noon, in the evening, and at bedtime. 400 each 3  . ketorolac  (ACULAR ) 0.5 % ophthalmic solution Place 1 drop into the right eye 4 (four) times daily.    . metoprolol  tartrate (LOPRESSOR ) 25 MG tablet Take 1 tablet (25 mg total) by mouth 2 (two) times daily. 60 tablet 0  . mometasone -formoterol  (DULERA ) 100-5 MCG/ACT AERO Inhale 2 puffs into the lungs 2 (two) times daily at 10 AM and 5 PM. 13 g 0  . Multiple  Vitamin (MULTIVITAMIN WITH MINERALS) TABS tablet Take 1 tablet by mouth daily.    . ofloxacin  (OCUFLOX ) 0.3 % ophthalmic solution Place 1 drop into the right eye 4 (four) times daily.    . oxyCODONE -acetaminophen  (PERCOCET) 7.5-325 MG tablet Take 1 tablet by mouth 4 (four) times daily as needed for moderate pain (pain score 4-6).    . pantoprazole  (PROTONIX ) 40 MG tablet Take 1 tablet (40 mg total) by mouth daily. 30 tablet 0  . prednisoLONE  acetate (PRED FORTE ) 1 % ophthalmic suspension Place 1 drop into the right eye 4 (four) times daily.    . Rimegepant Sulfate (NURTEC) 75 MG TBDP Take 1 tablet (75 mg total) by mouth as needed. Take 1 tablet at onset of headache, max is 1 tablet in 24 hours. 8 tablet 11  . tiZANidine  (ZANAFLEX ) 4 MG tablet Take 4 mg by mouth every 12 (twelve) hours as needed for muscle spasms.    . vitamin C  (ASCORBIC ACID ) 250 MG tablet Take 2 tablets (500 mg total) by mouth daily. 180 tablet 0   No current facility-administered medications for this visit.     PHYSICAL EXAMINATION: ECOG PERFORMANCE STATUS: 2 - Symptomatic, <50% confined to bed Vitals:   12/24/23 1448  BP: (!) 146/60  Pulse: 85  Resp: 18  Temp: 97.9 F (36.6 C)  SpO2: 99%   Filed Weights   12/24/23 1448  Weight: 218 lb 1.6 oz (98.9 kg)      Physical Exam Constitutional:      General: She is not in acute distress. HENT:     Head: Normocephalic and atraumatic.  Eyes:     General: No scleral icterus. Cardiovascular:     Rate and Rhythm: Normal rate and regular rhythm.     Heart sounds: Normal heart sounds.  Pulmonary:     Effort: Pulmonary effort is normal. No respiratory distress.     Breath sounds: No wheezing.  Abdominal:     General: Bowel sounds are normal. There is no distension.     Palpations: Abdomen is soft.  Musculoskeletal:  General: Normal range of motion.     Cervical back: Normal range of motion and neck supple.  Skin:    General: Skin is warm and dry.      Findings: No erythema or rash.  Neurological:     Mental Status: She is alert and oriented to person, place, and time. Mental status is at baseline.  Psychiatric:        Mood and Affect: Mood normal.     LABORATORY DATA:  I have reviewed the data as listed    Latest Ref Rng & Units 12/19/2023    1:45 PM 12/12/2023   11:03 AM 12/05/2023    1:34 PM  CBC  WBC 4.0 - 10.5 K/uL 7.2  7.9  7.5   Hemoglobin 12.0 - 15.0 g/dL 9.4  9.0    89.5  8.8   Hematocrit 36.0 - 46.0 % 27.1  30.7  26.9   Platelets 150 - 400 K/uL 134  158  162       Latest Ref Rng & Units 11/25/2023   10:17 AM 11/24/2023    4:03 PM 11/24/2023    1:48 AM  CMP  Glucose 70 - 99 mg/dL 680  733  685   BUN 6 - 20 mg/dL 28  30  34   Creatinine 0.44 - 1.00 mg/dL 8.65  8.55  8.18   Sodium 135 - 145 mmol/L 134  132  134   Potassium 3.5 - 5.1 mmol/L 5.0  5.4  5.4   Chloride 98 - 111 mmol/L 108  110  108   CO2 22 - 32 mmol/L 17  16  16    Calcium  8.9 - 10.3 mg/dL 8.0  8.0  7.7      Iron /TIBC/Ferritin/ %Sat    Component Value Date/Time   IRON  96 11/24/2023 0148   IRON  64 01/02/2019 0955   TIBC 248 (L) 11/24/2023 0148   TIBC 382 01/02/2019 0955   FERRITIN 466 (H) 11/24/2023 0148   FERRITIN 21 01/02/2019 0955   IRONPCTSAT 39 (H) 11/24/2023 0148   IRONPCTSAT 17 01/02/2019 0955      RADIOGRAPHIC STUDIES: I have personally reviewed the radiological images as listed and agreed with the findings in the report. MM 3D SCREENING MAMMOGRAM BILATERAL BREAST Result Date: 12/14/2023 CLINICAL DATA:  Screening. EXAM: DIGITAL SCREENING BILATERAL MAMMOGRAM WITH TOMOSYNTHESIS AND CAD TECHNIQUE: Bilateral screening digital craniocaudal and mediolateral oblique mammograms were obtained. Bilateral screening digital breast tomosynthesis was performed. The images were evaluated with computer-aided detection. COMPARISON:  Previous exam(s). ACR Breast Density Category b: There are scattered areas of fibroglandular density. FINDINGS: There are no  findings suspicious for malignancy. IMPRESSION: No mammographic evidence of malignancy. A result letter of this screening mammogram will be mailed directly to the patient. RECOMMENDATION: Screening mammogram in one year. (Code:SM-B-01Y) BI-RADS CATEGORY  1: Negative. Electronically Signed   By: Toribio Agreste M.D.   On: 12/14/2023 08:04   CT CHEST ABDOMEN PELVIS WO CONTRAST Result Date: 12/12/2023 CLINICAL DATA:  Lymphadenopathy, fatigue and generalized weakness with nausea and vomiting. * Tracking Code: BO * EXAM: CT CHEST, ABDOMEN AND PELVIS WITHOUT CONTRAST TECHNIQUE: Multidetector CT imaging of the chest, abdomen and pelvis was performed following the standard protocol without IV contrast. RADIATION DOSE REDUCTION: This exam was performed according to the departmental dose-optimization program which includes automated exposure control, adjustment of the mA and/or kV according to patient size and/or use of iterative reconstruction technique. COMPARISON:  Ultrasound May 12, 2023, CT May 11, 2023 and CT  February 09 2023 FINDINGS: CT CHEST FINDINGS Cardiovascular: Normal caliber thoracic aorta. Normal size heart. No significant pericardial effusion/thickening. Mediastinum/Nodes: No suspicious thyroid  nodule. Thoracic inlet lymph node measures 10 mm in short axis on image 6/2 previously 8 mm. Decreased size of the additional mediastinal and hilar lymph nodes. For reference: -precarinal lymph node measures 7 mm in short axis on image 18/2 previously 13 mm. -right hilar lymph node measures 7 mm in short axis on image 24/2 previously 2 cm. Lungs/Pleura: 5 mm ground-glass left upper lobe pulmonary nodule on image 30/3. Bibasilar atelectasis versus scarring. Musculoskeletal: No aggressive lytic or blastic lesion of bone. CT ABDOMEN PELVIS FINDINGS Hepatobiliary: No suspicious hepatic lesion on noncontrast enhanced examination. Gallbladder surgically absent. No biliary ductal dilation. Pancreas: No pancreatic  ductal dilation or evidence of acute inflammation. Spleen: Mild splenomegaly measuring 14.4 cm in maximum axial dimension previously 13.6 cm Adrenals/Urinary Tract: No suspicious adrenal nodule/mass. No hydronephrosis. No renal, ureteral or bladder calculi. Urinary bladder is unremarkable for degree of distension. Stomach/Bowel: Stomach is within normal limits. Appendix appears normal. No evidence of bowel wall thickening, distention, or inflammatory changes. Colonic stool burden suggestive of constipation. Vascular/Lymphatic: Normal caliber abdominal aorta. Smooth IVC contours. Increased size of prominent upper abdominal retroperitoneal lymph nodes. For reference: -a left periaortic lymph node measures 10 mm in short axis on image 66/2 previously 8 mm. Reproductive: Uterus and bilateral adnexa are unremarkable. Other: Trace pelvic free fluid is within physiologic normal limits. Musculoskeletal: No aggressive lytic or blastic lesion of bone. Disc space narrowing at L4-L5 with associated endplate degenerative remodeling/spurring and Modic type endplate changes. IMPRESSION: 1. Increased size of a prominent/mildly enlarged thoracic inlet, upper abdominal and retroperitoneal lymph nodes with mild splenomegaly. Decreased size of the other mediastinal and right hilar lymph nodes. Findings are nonspecific and may be reactive but lymphoproliferative disorder is a pertinent differential consideration. 2. 5 mm ground-glass left upper lobe pulmonary nodule, nonspecific but likely infectious or inflammatory. 3. Colonic stool burden suggestive of constipation. Electronically Signed   By: Reyes Holder M.D.   On: 12/12/2023 13:02

## 2023-12-24 NOTE — Progress Notes (Signed)
 [facility] Stone CONSULT NOTE  Patient Care Team: [name], NP as PCP - General (Nurse Practitioner) Michele Richardson, DO as PCP - Cardiology (Cardiology) Babara Call, MD as Consulting Physician (Oncology)  CHIEF COMPLAINTS/PURPOSE OF CONSULTATION:  Hemolytic anemia  ASSESSMENT & PLAN:  No problem-specific Assessment & Plan notes found for this encounter.  No orders of the defined types were placed in this encounter.  This is a 43 year old female patient who was previously seen by Dr.Yu for the past several years and has been referred to us  for second opinion given ongoing hemolytic anemia  Assessment and Plan Assessment & Plan Chronic hemolytic anemia, she has elevated retic count, normal bili, low haptoglobin Chronic hemolytic anemia with mild hemolysis, requiring intermittent transfusions. Negative Coombs, PNH, and autoimmune workup suggest non-classic etiology.   - Labs from this visit with stable Hb, no indication for transfusion - Consider re challenge of steroids if hemolysis persists and transfusions are frequent. - We can consider alpha thal testing, G6PD deficiency and other enzyme deficiency such as pyruvate kinase deficiency or IG A mediated human autoimmune hemolytic anemia as possible differential diagnosis.  Iron  deficiency anemia due to menorrhagia Iron  deficiency anemia likely secondary to menorrhagia.  Currently she is not iron  deficiency, no indicaton for transfusion  Chronic kidney disease Chronic kidney disease possibly contributing to anemia. Uncontrolled diabetes and hypertension may be factors.  Type 2 diabetes mellitus, uncontrolled Uncontrolled type 2 diabetes mellitus with recent hemoglobin A1c of 7.5%.    Weight HISTORY OF PRESENTING ILLNESS:  Melinda Stone 43 y.o. female is here because of hemolytic anemia  This is followed by hematology over the past several years patient has been extensively evaluated for underlying hemolytic  anemia. Very complex history, multiple hospitalization at times for blood loss anemia and some times for possible hemolysis. No evidence of nutritional deficiency She did have episodes of hemolysis in the past but not classic hemolysis and required blood transfusion, DAT neg, no cold agglutinin. She responded to steroids however she didn't tolerate them well, hence discontinued. Previous bone marrow biopsy showed Slightly hypercellular bone marrow with erythroid hyperplasia, No viral cytopathic changes or significant dyspoiesis. Suspect secondary process. Normal cytogenetics, normal myeloid NGS  MPN testing negative. CT chest abdomen pelvis with no lymphoproliferative disease. She says she wanted a second opinion since she doesn't seem to know why she is anemic. She came in a wheel chair today by herself. She denies any bleeding complaints at this time. She says she hasn't felt well in a long time and she hopes someone can help her. She says she has been needing transfusion approximately once a month. She was seen by Dr Babara for many yrs and wanted a second opinion  MEDICAL HISTORY:  Past Medical History:  Diagnosis Date   Anemia    Arthritis 08/17/2020   Chronic back pain    Diabetes mellitus without complication (HCC)    type 2   GERD (gastroesophageal reflux disease)    Hypertension    Migraine    Ovarian cyst    Pneumonia    Polyneuropathy     SURGICAL HISTORY: Past Surgical History:  Procedure Laterality Date   CATARACT EXTRACTION Right 11/08/2023   CESAREAN SECTION     x4   CHOLECYSTECTOMY  2004   EYE SURGERY Left    cataract   EYE SURGERY Left 10/2023   retinal surgery x2   HYSTEROSCOPY N/A 10/01/2023   Procedure: ABLATION, ENDOMETRIUM, HYSTEROSCOPIC;  Surgeon: Alger Gong, MD;  Location: [facility] OR;  Service: Gynecology;  Laterality: N/A;   IR BONE MARROW BIOPSY & ASPIRATION  08/01/2023   LUMBAR LAMINECTOMY N/A 05/30/2020   Procedure: Lumbar five Laminectomy,   Bilateral Microdiscectomy, Left Lumbar five -Sacral one Microdiscectomy;  Surgeon: Barbarann Oneil BROCKS, MD;  Location: [facility] OR;  Service: Orthopedics;  Laterality: N/A;   LUMBAR LAMINECTOMY Left 11/14/2020   Procedure: LEFT LUMBAR FOUR-FIVE MICRODISCECTOMY;  Surgeon: Barbarann Oneil BROCKS, MD;  Location: [facility] OR;  Service: Orthopedics;  Laterality: Left;   TUBAL LIGATION  10/19/2010    SOCIAL HISTORY: Social History   Socioeconomic History   Marital status: Single    Spouse name: Not on file   Number of children: 3   Years of education: Not on file   Highest education level: High school graduate  Occupational History    Comment: home maker  Tobacco Use   Smoking status: Former    Types: Cigars    Quit date: 12/23/2021    Years since quitting: 2.0    Passive exposure: Never   Smokeless tobacco: Never   Tobacco comments:    2 cigars daily. Khj 02/12/2023  Vaping Use   Vaping status: Never Used  Substance and Sexual Activity   Alcohol  use: Not Currently   Drug use: Not Currently    Comment: per patient stopped smoking marijuana Mar 2022   Sexual activity: Yes    Birth control/protection: Surgical  Other Topics Concern   Not on file  Social History Narrative   Lives with 3 children   Some coffee   Right handed   Doesn't work   Social Drivers of Corporate Investment Banker Strain: Not on file  Food Insecurity: No Food Insecurity (11/23/2023)   Hunger Vital Sign    Worried About Running Out of Food in the Last Year: Never true    Ran Out of Food in the Last Year: Never true  Transportation Needs: No Transportation Needs (11/23/2023)   PRAPARE - Administrator, Civil Service (Medical): No    Lack of Transportation (Non-Medical): No  Physical Activity: Not on file  Stress: Not on file  Social Connections: Moderately Isolated (07/31/2023)   Social Connection and Isolation Panel    Frequency of Communication with Friends and Family: Twice a week    Frequency of Social Gatherings  with Friends and Family: Twice a week    Attends Religious Services: 1 to 4 times per year    Active Member of [employer] or Organizations: No    Attends Banker Meetings: Never    Marital Status: Never married  Intimate Partner Violence: Not At Risk (11/23/2023)   Humiliation, Afraid, Rape, and Kick questionnaire    Fear of Current or Ex-Partner: No    Emotionally Abused: No    Physically Abused: No    Sexually Abused: No    FAMILY HISTORY: Family History  Problem Relation Age of Onset   Diabetes Mother    Hypertension Mother    Stroke Mother    Healthy Sister    Hypertension Brother    Asthma Brother    Hypertension Brother    Healthy Son    Healthy Daughter    Healthy Daughter     ALLERGIES:  is allergic to trulicity  [dulaglutide ].  MEDICATIONS:  Current Outpatient Medications  Medication Sig Dispense Refill   acetaminophen  (TYLENOL ) 500 MG tablet Take 1,000 mg by mouth every 6 (six) hours as needed for mild pain (pain score 1-3) or moderate  pain (pain score 4-6).     albuterol  (VENTOLIN  HFA) 108 (90 Base) MCG/ACT inhaler Inhale 2 puffs into the lungs every 6 (six) hours as needed for wheezing or shortness of breath. 18 g 2   amitriptyline  (ELAVIL ) 10 MG tablet Take 10 mg by mouth at bedtime.     amLODipine  (NORVASC ) 5 MG tablet Take 1 tablet (5 mg total) by mouth daily. 30 tablet 0   brimonidine  (ALPHAGAN ) 0.2 % ophthalmic solution Place 1 drop into the left eye 2 (two) times daily.     Continuous Glucose Sensor (DEXCOM G7 SENSOR) MISC 1 Device by Does not apply route as directed. Change sensor every 10 days 9 each 3   Cyanocobalamin  (VITAMIN B-12) 2500 MCG SUBL Place 1 tablet (2,500 mcg total) under the tongue daily. 30 tablet 2   dorzolamide -timolol  (COSOPT ) 2-0.5 % ophthalmic solution Place 1 drop into the left eye 2 (two) times daily.     ferrous sulfate  325 (65 FE) MG tablet Take 1 tablet (325 mg total) by mouth daily with breakfast. 30 tablet 0   fluticasone   (FLONASE ) 50 MCG/ACT nasal spray Place 2 sprays into both nostrils daily. 16 g 0   furosemide  (LASIX ) 20 MG tablet TAKE 1 TABLET(20 MG) BY MOUTH IN THE MORNING 90 tablet 2   gabapentin  (NEURONTIN ) 800 MG tablet Take 800 mg by mouth 3 (three) times daily.     Galcanezumab -gnlm (EMGALITY ) 120 MG/ML SOAJ Inject 120 mg into the skin every 30 (thirty) days. (Patient not taking: Reported on 12/05/2023) 1.12 mL 11   Insulin  Disposable Pump (OMNIPOD 5 G7 PODS, GEN 5,) MISC 1 Device by Does not apply route every other day. (Patient not taking: Reported on 12/05/2023) 45 each 3   insulin  glargine (LANTUS  SOLOSTAR) 100 UNIT/ML Solostar Pen Inject 45 Units into the skin daily before breakfast. 45 mL 3   insulin  lispro (HUMALOG  KWIKPEN) 100 UNIT/ML KwikPen Inject 12-22 Units into the skin 3 (three) times daily. Max daily 60 mL 4   Insulin  Pen Needle 32G X 4 MM MISC 1 Device by Does not apply route in the morning, at noon, in the evening, and at bedtime. 400 each 3   ketorolac  (ACULAR ) 0.5 % ophthalmic solution Place 1 drop into the right eye 4 (four) times daily.     metoprolol  tartrate (LOPRESSOR ) 25 MG tablet Take 1 tablet (25 mg total) by mouth 2 (two) times daily. 60 tablet 0   mometasone -formoterol  (DULERA ) 100-5 MCG/ACT AERO Inhale 2 puffs into the lungs 2 (two) times daily at 10 AM and 5 PM. 13 g 0   Multiple Vitamin (MULTIVITAMIN WITH MINERALS) TABS tablet Take 1 tablet by mouth daily.     ofloxacin  (OCUFLOX ) 0.3 % ophthalmic solution Place 1 drop into the right eye 4 (four) times daily.     oxyCODONE -acetaminophen  (PERCOCET) 7.5-325 MG tablet Take 1 tablet by mouth 4 (four) times daily as needed for moderate pain (pain score 4-6).     pantoprazole  (PROTONIX ) 40 MG tablet Take 1 tablet (40 mg total) by mouth daily. 30 tablet 0   prednisoLONE  acetate (PRED FORTE ) 1 % ophthalmic suspension Place 1 drop into the right eye 4 (four) times daily.     Rimegepant Sulfate (NURTEC) 75 MG TBDP Take 1 tablet (75 mg  total) by mouth as needed. Take 1 tablet at onset of headache, max is 1 tablet in 24 hours. 8 tablet 11   tiZANidine  (ZANAFLEX ) 4 MG tablet Take 4 mg by mouth every 12 (  twelve) hours as needed for muscle spasms.     vitamin C  (ASCORBIC ACID ) 250 MG tablet Take 2 tablets (500 mg total) by mouth daily. 180 tablet 0   No current facility-administered medications for this visit.     PHYSICAL EXAMINATION: ECOG PERFORMANCE STATUS: 2 - Symptomatic, <50% confined to bed  Vitals:   12/24/23 1448  BP: (!) 146/60  Pulse: 85  Resp: 18  Temp: 97.9 F (36.6 C)  SpO2: 99%   Filed Weights   12/24/23 1448  Weight: 218 lb 1.6 oz (98.9 kg)    GENERAL:alert, no distress and comfortable, obese. NECK: supple, thyroid  normal size, non-tender, without nodularity LYMPH:  no palpable lymphadenopathy in the cervical, axillary  LUNGS: clear to auscultation and percussion with normal breathing effort HEART: regular rate & rhythm and no murmurs and no lower extremity edema Musculoskeletal:no cyanosis of digits and no clubbing  PSYCH: alert & oriented x 3 with fluent speech NEURO: no focal motor/sensory deficits  LABORATORY DATA:  I have reviewed the data as listed Lab Results  Component Value Date   WBC 7.2 12/19/2023   HGB 9.4 (L) 12/19/2023   HCT 27.1 (L) 12/19/2023   MCV 78.8 (L) 12/19/2023   PLT 134 (L) 12/19/2023     Chemistry      Component Value Date/Time   NA 134 (L) 11/25/2023 1017   NA 137 07/23/2023 1200   K 5.0 11/25/2023 1017   CL 108 11/25/2023 1017   CO2 17 (L) 11/25/2023 1017   BUN 28 (H) 11/25/2023 1017   BUN 23 07/23/2023 1200   CREATININE 1.34 (H) 11/25/2023 1017   CREATININE 0.99 10/28/2023 1319      Component Value Date/Time   CALCIUM  8.0 (L) 11/25/2023 1017   ALKPHOS 88 11/23/2023 0838   AST 15 11/23/2023 0838   AST 16 10/28/2023 1319   ALT 9 11/23/2023 0838   ALT 18 10/28/2023 1319   BILITOT 1.0 11/23/2023 0838   BILITOT 0.8 10/28/2023 1319        RADIOGRAPHIC STUDIES: I have personally reviewed the radiological images as listed and agreed with the findings in the report. MM 3D SCREENING MAMMOGRAM BILATERAL BREAST Result Date: 12/14/2023 CLINICAL DATA:  Screening. EXAM: DIGITAL SCREENING BILATERAL MAMMOGRAM WITH TOMOSYNTHESIS AND CAD TECHNIQUE: Bilateral screening digital craniocaudal and mediolateral oblique mammograms were obtained. Bilateral screening digital breast tomosynthesis was performed. The images were evaluated with computer-aided detection. COMPARISON:  Previous exam(s). ACR Breast Density Category b: There are scattered areas of fibroglandular density. FINDINGS: There are no findings suspicious for malignancy. IMPRESSION: No mammographic evidence of malignancy. A result letter of this screening mammogram will be mailed directly to the patient. RECOMMENDATION: Screening mammogram in one year. (Code:SM-B-01Y) BI-RADS CATEGORY  1: Negative. Electronically Signed   By: Toribio Agreste M.D.   On: 12/14/2023 08:04   CT CHEST ABDOMEN PELVIS WO CONTRAST Result Date: 12/12/2023 CLINICAL DATA:  Lymphadenopathy, fatigue and generalized weakness with nausea and vomiting. * Tracking Code: BO * EXAM: CT CHEST, ABDOMEN AND PELVIS WITHOUT CONTRAST TECHNIQUE: Multidetector CT imaging of the chest, abdomen and pelvis was performed following the standard protocol without IV contrast. RADIATION DOSE REDUCTION: This exam was performed according to the departmental dose-optimization program which includes automated exposure control, adjustment of the mA and/or kV according to patient size and/or use of iterative reconstruction technique. COMPARISON:  Ultrasound May 12, 2023, CT May 11, 2023 and CT February 09 2023 FINDINGS: CT CHEST FINDINGS Cardiovascular: Normal caliber thoracic aorta. Normal size  heart. No significant pericardial effusion/thickening. Mediastinum/Nodes: No suspicious thyroid  nodule. Thoracic inlet lymph node measures 10 mm in  short axis on image 6/2 previously 8 mm. Decreased size of the additional mediastinal and hilar lymph nodes. For reference: -precarinal lymph node measures 7 mm in short axis on image 18/2 previously 13 mm. -right hilar lymph node measures 7 mm in short axis on image 24/2 previously 2 cm. Lungs/Pleura: 5 mm ground-glass left upper lobe pulmonary nodule on image 30/3. Bibasilar atelectasis versus scarring. Musculoskeletal: No aggressive lytic or blastic lesion of bone. CT ABDOMEN PELVIS FINDINGS Hepatobiliary: No suspicious hepatic lesion on noncontrast enhanced examination. Gallbladder surgically absent. No biliary ductal dilation. Pancreas: No pancreatic ductal dilation or evidence of acute inflammation. Spleen: Mild splenomegaly measuring 14.4 cm in maximum axial dimension previously 13.6 cm Adrenals/Urinary Tract: No suspicious adrenal nodule/mass. No hydronephrosis. No renal, ureteral or bladder calculi. Urinary bladder is unremarkable for degree of distension. Stomach/Bowel: Stomach is within normal limits. Appendix appears normal. No evidence of bowel wall thickening, distention, or inflammatory changes. Colonic stool burden suggestive of constipation. Vascular/Lymphatic: Normal caliber abdominal aorta. Smooth IVC contours. Increased size of prominent upper abdominal retroperitoneal lymph nodes. For reference: -a left periaortic lymph node measures 10 mm in short axis on image 66/2 previously 8 mm. Reproductive: Uterus and bilateral adnexa are unremarkable. Other: Trace pelvic free fluid is within physiologic normal limits. Musculoskeletal: No aggressive lytic or blastic lesion of bone. Disc space narrowing at L4-L5 with associated endplate degenerative remodeling/spurring and Modic type endplate changes. IMPRESSION: 1. Increased size of a prominent/mildly enlarged thoracic inlet, upper abdominal and retroperitoneal lymph nodes with mild splenomegaly. Decreased size of the other mediastinal and right hilar  lymph nodes. Findings are nonspecific and may be reactive but lymphoproliferative disorder is a pertinent differential consideration. 2. 5 mm ground-glass left upper lobe pulmonary nodule, nonspecific but likely infectious or inflammatory. 3. Colonic stool burden suggestive of constipation. Electronically Signed   By: Reyes Holder M.D.   On: 12/12/2023 13:02    All questions were answered. The patient knows to call the clinic with any problems, questions or concerns. I spent 75 minutes in the care of this patient including H and P, review of records, counseling and coordination of care.     Amber Stalls, MD 12/24/2023 3:10 PM

## 2023-12-25 ENCOUNTER — Telehealth: Payer: Self-pay

## 2023-12-25 ENCOUNTER — Ambulatory Visit: Payer: Self-pay | Admitting: Hematology and Oncology

## 2023-12-25 LAB — IRON AND IRON BINDING CAPACITY (CC-WL,HP ONLY)
Iron: 77 ug/dL (ref 28–170)
Saturation Ratios: 23 % (ref 10.4–31.8)
TIBC: 335 ug/dL (ref 250–450)
UIBC: 258 ug/dL (ref 148–442)

## 2023-12-25 LAB — HAPTOGLOBIN: Haptoglobin: <10 mg/dL — ABNORMAL LOW (ref 42–296)

## 2023-12-25 LAB — FOLATE RBC
Folate, Hemolysate: 407 ng/mL
Folate, RBC: 1326 ng/mL (ref >498)
Hematocrit: 30.7 % — ABNORMAL LOW (ref 34.0–46.6)

## 2023-12-25 NOTE — Telephone Encounter (Signed)
 Attempted to call pt and discuss results per MD. LVM for call back.

## 2023-12-25 NOTE — Telephone Encounter (Signed)
-----   Message from Jewett Iruku sent at 12/25/2023  8:27 AM EDT ----- No indication for blood transfusion. Hemolysis appears to be better. Kidney numbers look better. Ok to continue monitoring. ----- Message ----- From: Rebecka, Lab [facility] Sent: 12/24/2023   3:57 PM EDT To: Amber Stalls, MD

## 2023-12-25 NOTE — Telephone Encounter (Signed)
 Patient has established care with heme/ onc Dr. Loretha. Please cancel appointments on 10/9 and 10/13. Spoke to pt and infomed her that appts at [facility] will be cancelled and she should keep appts with Dr. Lily. Pt verbalized understanding.

## 2023-12-26 ENCOUNTER — Inpatient Hospital Stay

## 2023-12-26 ENCOUNTER — Ambulatory Visit

## 2023-12-26 LAB — PROTEIN ELECTROPHORESIS, SERUM, WITH REFLEX
A/G Ratio: 1.2 (ref 0.7–1.7)
Albumin ELP: 3.3 g/dL (ref 2.9–4.4)
Alpha-1-Globulin: 0.2 g/dL (ref 0.0–0.4)
Alpha-2-Globulin: 0.6 g/dL (ref 0.4–1.0)
Beta Globulin: 1 g/dL (ref 0.7–1.3)
Gamma Globulin: 1 g/dL (ref 0.4–1.8)
Globulin, Total: 2.8 g/dL (ref 2.2–3.9)
Total Protein ELP: 6.1 g/dL (ref 6.0–8.5)

## 2023-12-27 ENCOUNTER — Encounter: Payer: Self-pay | Admitting: Oncology

## 2023-12-27 ENCOUNTER — Encounter: Payer: Self-pay | Admitting: Internal Medicine

## 2023-12-27 ENCOUNTER — Ambulatory Visit (INDEPENDENT_AMBULATORY_CARE_PROVIDER_SITE_OTHER): Admitting: Internal Medicine

## 2023-12-27 VITALS — BP 136/82 | HR 79 | Ht 69.0 in | Wt 221.0 lb

## 2023-12-27 DIAGNOSIS — Z794 Long term (current) use of insulin: Secondary | ICD-10-CM | POA: Diagnosis not present

## 2023-12-27 DIAGNOSIS — R809 Proteinuria, unspecified: Secondary | ICD-10-CM

## 2023-12-27 DIAGNOSIS — E1142 Type 2 diabetes mellitus with diabetic polyneuropathy: Secondary | ICD-10-CM | POA: Diagnosis not present

## 2023-12-27 DIAGNOSIS — E1129 Type 2 diabetes mellitus with other diabetic kidney complication: Secondary | ICD-10-CM

## 2023-12-27 DIAGNOSIS — E11319 Type 2 diabetes mellitus with unspecified diabetic retinopathy without macular edema: Secondary | ICD-10-CM

## 2023-12-27 LAB — POCT GLYCOSYLATED HEMOGLOBIN (HGB A1C): Hemoglobin A1C: 7.8 % — AB (ref 4.0–5.6)

## 2023-12-27 MED ORDER — LANTUS SOLOSTAR 100 UNIT/ML ~~LOC~~ SOPN: 50.0000 [IU] | PEN_INJECTOR | Freq: Every day | SUBCUTANEOUS | 4 refills | Status: DC

## 2023-12-27 NOTE — Patient Instructions (Addendum)
-   Increase Lantus  50 units daily - Continue Humalog  12 units with each meal  -Humalog   correctional insulin : ADD extra units on insulin  to your meal-time Novolog  dose if your blood sugars are higher than 155. Use the scale below to help guide you:   Blood sugar before meal Number of units to inject  Less than 155 0 unit  156 -  180 1 units  181 -  205 2 units  206 -  230 3 units  231 -  255 4 units  256 -  280 5 units  281 -  305 6 units  306 -  330 7 units  331 -  355 8 units  356 - 380 9 units       HOW TO TREAT LOW BLOOD SUGARS (Blood sugar LESS THAN 70 MG/DL) Please follow the RULE OF 15 for the treatment of hypoglycemia treatment (when your (blood sugars are less than 70 mg/dL)   STEP 1: Take 15 grams of carbohydrates when your blood sugar is low, which includes:  3-4 GLUCOSE TABS  OR 3-4 OZ OF JUICE OR REGULAR SODA OR ONE TUBE OF GLUCOSE GEL    STEP 2: RECHECK blood sugar in 15 MINUTES STEP 3: If your blood sugar is still low at the 15 minute recheck --> then, go back to STEP 1 and treat AGAIN with another 15 grams of carbohydrates.

## 2023-12-27 NOTE — Progress Notes (Signed)
 Name: Melinda Stone  Age/ Sex: 43 y.o., female   MRN/ DOB: [id number], 08-Dec-1980     PCP: [name], NP   Reason for Endocrinology Evaluation: Type 2 Diabetes Mellitus  Initial Endocrine Consultative Visit: 12/03/2018    PATIENT IDENTIFIER: [name] is a 43 y.o. female with a past medical history of T2DM. The patient has followed with Endocrinology clinic since 12/03/2018 for consultative assistance with management of her diabetes.  DIABETIC HISTORY:  Melinda Stone was diagnosed with DMat age 49. Metformin  had caused GI issues and hypoglycemia. Her hemoglobin A1c has ranged from 7.0% in 04/2018, peaking at 7.8% in 10/2018  On her initial visit to our clinic she had an A1c of 7.8%. She was on Jardiance , she had a prescription for Trulicity  and Jardiance  were too costly for her. We added glipizide  .    Atteted to  Jardiance  09/2020 but she did not pick it up  Trulicity  started 2022 but by 08/2021 she developed GI side effects and we stopped  Developed abdominal pain to Ozempic  09/2022  Jardiance  discontinued due to DKA in 2024   She was discharged on insulin  after hospitalization for complicated pneumonia requiring intubation and ICU admission 04/2023   I had discontinued glipizide  and started her on prandial dose of insulin  in July, 2025 with an A1c of 8.7%  I did prescribe OmniPod in July, 2025  She was referred to nephrology for elevated MA/CR ratio in July, 2025     SUBJECTIVE:   During the last visit (09/26/2023): A1c 8.7% .    Today (12/27/2023): Melinda Stone is here for a follow up on diabetes.  She checks her blood sugars occasionally . The patient has had hypoglycemic episodes since the last clinic visit.   Since her last visit here she underwent endometrial ablation in July, 2025   She has had multiple ED visits for variable reasons including acute cystitis, cough, chest pain, vaginal bleed  She continues to receive iron  infusions  for symptomatic hemolytic anemia, follows with hematology.  Entertaining rituximab if hemolysis persists. Last transfusion last month   Per pt she was evaluated by nephrology , I am unable to find these records  She continues with chronic abdominal pain associated with vomiting that she attributes to Vitamin B12 injection  No constipation or diarrhea   HOME DIABETES REGIMEN:  Lantus  45 units daily  Humalog  12 units TIDQAC CF: Humalog  (BG -130/25) TID   CONTINUOUS GLUCOSE MONITORING RECORD INTERPRETATION    Dates of Recording: 9/27-10/12/2023  Sensor description:dexcom  Results statistics:   CGM use % of time 0  Average and SD 324/0  Time in range 0 %  % Time Above 180 0  % Time above 250 100  % Time Below target 0   Glycemic patterns summary: Hyperglycemia noted throughout the day and night Hyperglycemic episodes all day and night  Hypoglycemic episodes occurred N/A  Overnight periods: High   DIABETIC COMPLICATIONS: Microvascular complications:   B/L retinopathy , S/P injections  Denies: CKD, neuropathy  Last eye exam: Completed 08/2021   Macrovascular complications:    Denies: CAD, PVD, CVA   HISTORY:  Past Medical History:  Past Medical History:  Diagnosis Date   Anemia    Arthritis 08/17/2020   Chronic back pain    Diabetes mellitus without complication (HCC)    type 2   GERD (gastroesophageal reflux disease)    Hypertension    Migraine    Ovarian cyst    Pneumonia  Polyneuropathy    Past Surgical History:  Past Surgical History:  Procedure Laterality Date   CATARACT EXTRACTION Right 11/08/2023   CESAREAN SECTION     x4   CHOLECYSTECTOMY  2004   EYE SURGERY Left    cataract   EYE SURGERY Left 10/2023   retinal surgery x2   HYSTEROSCOPY N/A 10/01/2023   Procedure: ABLATION, ENDOMETRIUM, HYSTEROSCOPIC;  Surgeon: Alger Gong, MD;  Location: [facility] OR;  Service: Gynecology;  Laterality: N/A;   IR BONE MARROW BIOPSY & ASPIRATION  08/01/2023    LUMBAR LAMINECTOMY N/A 05/30/2020   Procedure: Lumbar five Laminectomy,  Bilateral Microdiscectomy, Left Lumbar five -Sacral one Microdiscectomy;  Surgeon: Barbarann Oneil BROCKS, MD;  Location: [facility] OR;  Service: Orthopedics;  Laterality: N/A;   LUMBAR LAMINECTOMY Left 11/14/2020   Procedure: LEFT LUMBAR FOUR-FIVE MICRODISCECTOMY;  Surgeon: Barbarann Oneil BROCKS, MD;  Location: [facility] OR;  Service: Orthopedics;  Laterality: Left;   TUBAL LIGATION  10/19/2010   Social History:  reports that she quit smoking about 2 years ago. Her smoking use included cigars. She has never been exposed to tobacco smoke. She has never used smokeless tobacco. She reports that she does not currently use alcohol . She reports that she does not currently use drugs. Family History:  Family History  Problem Relation Age of Onset   Diabetes Mother    Hypertension Mother    Stroke Mother    Healthy Sister    Hypertension Brother    Asthma Brother    Hypertension Brother    Healthy Son    Healthy Daughter    Healthy Daughter      HOME MEDICATIONS: Allergies as of 12/27/2023       Reactions   Trulicity  [dulaglutide ] Diarrhea        Medication List        Accurate as of December 27, 2023  7:18 AM. If you have any questions, ask your nurse or doctor.          acetaminophen  500 MG tablet Commonly known as: TYLENOL  Take 1,000 mg by mouth every 6 (six) hours as needed for mild pain (pain score 1-3) or moderate pain (pain score 4-6).   amitriptyline  10 MG tablet Commonly known as: ELAVIL  Take 10 mg by mouth at bedtime.   amLODipine  5 MG tablet Commonly known as: NORVASC  Take 1 tablet (5 mg total) by mouth daily.   brimonidine  0.2 % ophthalmic solution Commonly known as: ALPHAGAN  Place 1 drop into the left eye 2 (two) times daily.   Dexcom G7 Sensor Misc 1 Device by Does not apply route as directed. Change sensor every 10 days   dorzolamide -timolol  2-0.5 % ophthalmic solution Commonly known as: COSOPT  Place 1 drop  into the left eye 2 (two) times daily.   Dulera  100-5 MCG/ACT Aero Generic drug: mometasone -formoterol  Inhale 2 puffs into the lungs 2 (two) times daily at 10 AM and 5 PM.   Emgality  120 MG/ML Soaj Generic drug: Galcanezumab -gnlm Inject 120 mg into the skin every 30 (thirty) days.   FeroSul 325 (65 Fe) MG tablet Generic drug: ferrous sulfate  Take 1 tablet (325 mg total) by mouth daily with breakfast.   fluticasone  50 MCG/ACT nasal spray Commonly known as: FLONASE  Place 2 sprays into both nostrils daily.   furosemide  20 MG tablet Commonly known as: LASIX  TAKE 1 TABLET(20 MG) BY MOUTH IN THE MORNING   gabapentin  800 MG tablet Commonly known as: NEURONTIN  Take 800 mg by mouth 3 (three) times daily.   insulin   lispro 100 UNIT/ML KwikPen Commonly known as: HumaLOG  KwikPen Inject 12-22 Units into the skin 3 (three) times daily. Max daily   Insulin  Pen Needle 32G X 4 MM Misc 1 Device by Does not apply route in the morning, at noon, in the evening, and at bedtime.   ketorolac  0.5 % ophthalmic solution Commonly known as: ACULAR  Place 1 drop into the right eye 4 (four) times daily.   Lantus  SoloStar 100 UNIT/ML Solostar Pen Generic drug: insulin  glargine Inject 45 Units into the skin daily before breakfast.   metoprolol  tartrate 25 MG tablet Commonly known as: LOPRESSOR  Take 1 tablet (25 mg total) by mouth 2 (two) times daily.   multivitamin with minerals Tabs tablet Take 1 tablet by mouth daily.   Nurtec 75 MG Tbdp Generic drug: Rimegepant Sulfate Take 1 tablet (75 mg total) by mouth as needed. Take 1 tablet at onset of headache, max is 1 tablet in 24 hours.   ofloxacin  0.3 % ophthalmic solution Commonly known as: OCUFLOX  Place 1 drop into the right eye 4 (four) times daily.   Omnipod 5 G7 Pods (Gen 5) Misc 1 Device by Does not apply route every other day.   oxyCODONE -acetaminophen  7.5-325 MG tablet Commonly known as: PERCOCET Take 1 tablet by mouth 4 (four) times  daily as needed for moderate pain (pain score 4-6).   pantoprazole  40 MG tablet Commonly known as: PROTONIX  Take 1 tablet (40 mg total) by mouth daily.   prednisoLONE  acetate 1 % ophthalmic suspension Commonly known as: PRED FORTE  Place 1 drop into the right eye 4 (four) times daily.   tiZANidine  4 MG tablet Commonly known as: ZANAFLEX  Take 4 mg by mouth every 12 (twelve) hours as needed for muscle spasms.   Ventolin  HFA 108 (90 Base) MCG/ACT inhaler Generic drug: albuterol  Inhale 2 puffs into the lungs every 6 (six) hours as needed for wheezing or shortness of breath.   Vitamin B-12 2500 MCG Subl Place 1 tablet (2,500 mcg total) under the tongue daily.   vitamin C  250 MG tablet Commonly known as: ASCORBIC ACID  Take 2 tablets (500 mg total) by mouth daily.         OBJECTIVE:   Vital Signs: LMP 11/23/2023 (Exact Date)   Wt Readings from Last 3 Encounters:  12/24/23 218 lb 1.6 oz (98.9 kg)  12/05/23 219 lb 14.4 oz (99.7 kg)  12/02/23 221 lb 6.4 oz (100.4 kg)     Exam: General: Pt appears drowsy, c/o arthralgia   Lungs: Clear with good BS bilat   Heart: RRR   Extremities: No  pretibial edema.   Neuro: MS is good with appropriate affect, pt is alert and Ox3    DM foot exam: 09/26/2023 The skin of the feet is intact without sores or ulcerations. The pedal pulses are 2+ on right and 2+ on left. The sensation is absent to a screening 5.07, 10 gram monofilament bilaterally     DATA REVIEWED:  Lab Results  Component Value Date   HGBA1C 7.5 (H) 10/01/2023   HGBA1C 8.7 (A) 09/26/2023   HGBA1C 4.6 (L) 07/22/2023     Latest Reference Range & Units 06/11/23 14:50  Sodium 135 - 145 mmol/L 133 (L)  Potassium 3.5 - 5.1 mmol/L 4.4  Chloride 98 - 111 mmol/L 105  CO2 22 - 32 mmol/L 21 (L)  Glucose 70 - 99 mg/dL 659 (H)  BUN 6 - 20 mg/dL 20  Creatinine 9.55 - 8.99 mg/dL 8.94 (H)  Calcium  8.9 - 10.3  mg/dL 8.1 (L)  Anion gap 5 - 15  7  Alkaline Phosphatase 38 - 126  U/L 90  Albumin 3.5 - 5.0 g/dL 2.9 (L)  AST 15 - 41 U/L 15  ALT 0 - 44 U/L 13  Total Protein 6.5 - 8.1 g/dL 5.8 (L)  Total Bilirubin 0.0 - 1.2 mg/dL 1.0  GFR, Estimated >39 mL/min >60    ASSESSMENT / PLAN / RECOMMENDATIONS:   1) Type 2 Diabetes Mellitus, poorly controlled, 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 Goal A1c < 7.0 %.    -A1c is skewed due to blood transfusion -In reviewing CGM download, 100% of her BG readings are >180 mg/dL  -Intolerant to Trulicity  and Ozempic  due to abdominal pain -Developed DKA on Jardiance  2024 -She has been on insulin  since 04/2023 following hospitalization - Her in office BG 333 MGs/DL on Dexcom, the patient did eat breakfast without taking prandial insulin , I did encourage the patient of the importance of taking prandial insulin  before the meal - I will increase her basal insulin  as she continues with persistent hyperglycemia - I have prescribed OmniPod in July, 2025, she was supposed to contact our CDE for training schedule, as when they contacted her to schedule she did not have her OmniPod yet.  A new referral has been placed  MEDICATIONS: Increase Lantus  50 units daily Take Humalog  12 unitsTIDQAC CF: Humalog  (BG-130/25) TIDQAC   EDUCATION / INSTRUCTIONS: BG monitoring instructions: Patient is instructed to check her blood sugars 2 times a day, fasting and supper time. Call [facility] if: BG persistently < 70  I reviewed the Rule of 15 for the treatment of hypoglycemia in detail with the patient. Literature supplied.  2) Microalbuminuria :  - I referred her to nephrology - Per patient, she did have an appointment with them, not an available at this time  F/U in 3 months    Signed electronically by: Stefano Redgie Butts, MD  [facility]  [facility] [address] [place], [place] [address] Phone: [phone] FAX:  [phone]   CC: [name], NP [address] [address] [place] [address] Phone: [phone]  Fax: [phone]  Return to Endocrinology clinic as below: Future Appointments  Date Time Provider Department Center  12/27/2023  8:30 AM Laquandra Carrillo, Donell Redgie, MD [facility] None  12/31/2023 10:30 AM Loretha Ash, MD [facility] None  01/06/2024 10:30 AM Malachy Comer GAILS, NP [facility] [id number]  02/19/2024  3:45 PM Gayland Lauraine PARAS, NP [facility] None

## 2023-12-30 ENCOUNTER — Inpatient Hospital Stay

## 2023-12-30 ENCOUNTER — Telehealth: Payer: Self-pay

## 2023-12-30 NOTE — Telephone Encounter (Signed)
 Spoke with patient and confirmed appointment on 10/14

## 2023-12-31 ENCOUNTER — Ambulatory Visit: Admitting: Hematology and Oncology

## 2024-01-06 ENCOUNTER — Ambulatory Visit: Admitting: Nurse Practitioner

## 2024-01-10 ENCOUNTER — Encounter (HOSPITAL_COMMUNITY): Payer: Self-pay

## 2024-01-10 ENCOUNTER — Ambulatory Visit (HOSPITAL_COMMUNITY)
Admission: EM | Admit: 2024-01-10 | Discharge: 2024-01-10 | Disposition: A | Discharge status: Home / Self Care | Attending: Family Medicine | Admitting: Family Medicine

## 2024-01-10 DIAGNOSIS — J019 Acute sinusitis, unspecified: Secondary | ICD-10-CM | POA: Diagnosis not present

## 2024-01-10 MED ORDER — CEFDINIR 300 MG PO CAPS: 600.0000 mg | ORAL_CAPSULE | Freq: Every day | ORAL | 0 refills | Status: AC

## 2024-01-10 NOTE — Discharge Instructions (Addendum)
 Take cefdinir  300 mg--2 capsules together daily for 7 days  Take Mucinex  or Robitussin as needed for thick congestion and mucus.  Make sure you are drinking plenty of fluids.

## 2024-01-10 NOTE — ED Provider Notes (Signed)
 [facility]    CSN: [id number] Arrival date & time: 01/10/24  1059      History   Chief Complaint Chief Complaint  Patient presents with   Cough   Hoarse    HPI Melinda Stone is a 43 y.o. female.    Cough Here for cough and nasal congestion and postnasal drainage and hoarseness.  Symptoms have been going on for about a week.  No fever or headache.  No wheezing or chest tightness.  She does have a history of asthma but has not been bothering her.  She has a history of diabetes and sugars have been in the low 300s.  NKDA    Past Medical History:  Diagnosis Date   Anemia    Arthritis 08/17/2020   Chronic back pain    Diabetes mellitus without complication (HCC)    type 2   GERD (gastroesophageal reflux disease)    Hypertension    Migraine    Ovarian cyst    Pneumonia    Polyneuropathy     Patient Active Problem List   Diagnosis Date Noted   Lymphadenopathy 12/05/2023   Chronic kidney disease, stage 3a (HCC) 11/24/2023   Acute on chronic anemia 11/23/2023   Rhinovirus 11/23/2023   ESR raised 11/05/2023   Leukocytosis 10/29/2023   Tension headache 10/29/2023   Moderate persistent asthma 10/29/2023   Acute sinusitis 10/28/2023   Dysfunctional uterine bleeding 10/01/2023   AKI (acute kidney injury) 09/05/2023   Urinary tract infection symptoms 09/05/2023   Thrombocytopenia 08/01/2023   Anemia of chronic disease 07/31/2023   Metabolic acidosis 07/31/2023   Menorrhagia 07/31/2023   Prolapsed lumbosacral intervertebral disc 07/31/2023   Essential hypertension 07/31/2023   Anxiety 07/31/2023   DM2 (diabetes mellitus, type 2) (HCC) 07/31/2023   Chlamydia 07/31/2023   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, with long-term current use of insulin  (HCC) 06/21/2023   Hepatosplenomegaly 06/14/2023   Respiratory failure (HCC) 05/23/2023   Debility 05/21/2023   Type 2 diabetes mellitus with hypoglycemia without coma (HCC)  05/21/2023   Hyperglycemia 05/15/2023   Acute hypoxic respiratory failure (HCC) 05/12/2023   Influenza A 05/12/2023   ARDS (adult respiratory distress syndrome) (HCC) 05/12/2023   Pneumonia due to infectious organism 05/12/2023   Nausea and vomiting 05/11/2023   CAP (community acquired pneumonia) 03/26/2023   Sepsis (HCC) 03/26/2023   Cellulitis of fifth toe 03/26/2023   Multifocal pneumonia 03/25/2023   DKA (diabetic ketoacidosis) (HCC) 12/24/2022   DKA, type 2 (HCC) 12/24/2022   Migraine 07/11/2022   Hypokalemia 05/14/2022   Normocytic anemia 05/14/2022   Pneumonitis 05/13/2022   Grade I diastolic dysfunction 05/13/2022   Carpal tunnel syndrome 05/13/2022   Pain of breast 05/13/2022   Contusion of upper limb 05/13/2022   Obesity, class 1 05/13/2022   Overweight (BMI 25.0-29.9) 05/13/2022   Aspirat pneumonitis due to anesth during preg, first tri 05/05/2022   Need for prophylactic vaccination and inoculation against influenza 02/07/2022   Low serum vitamin B12 01/30/2022   IDA (iron  deficiency anemia) 01/30/2022   Mild intermittent asthma 01/15/2022   Esophageal reflux 01/15/2022   Dyspnea 09/01/2021   Pulmonary nodules 09/01/2021   Absolute anemia 09/01/2021   Tobacco use 09/01/2021   Hypotension 09/01/2021   Nausea & vomiting 06/08/2021   UTI (urinary tract infection) 06/08/2021   Post laminectomy syndrome 04/11/2021   HNP (herniated nucleus pulposus), lumbar 11/14/2020   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   Chronic pain syndrome 06/14/2020  Pharmacologic therapy 06/14/2020   Disorder of skeletal system 06/14/2020   Problems influencing health status 06/14/2020   History of marijuana use 06/14/2020   History of illicit drug use 06/14/2020   Abnormal drug screen (05/25/2020) 06/14/2020   Marijuana use 06/14/2020   Abnormal MRI, lumbar spine (05/16/2020) 06/14/2020   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, L5-S1) 06/14/2020   Lumbar  lateral recess stenosis (Left: L4-5, L5-S1) 06/14/2020   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) 06/14/2020   S/P lumbar microdiscectomy 06/07/2020   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   GI bleed 12/01/2019   Muscle spasm 11/06/2019   Sciatica 11/06/2019   Low back pain 02/04/2019   Type 2 diabetes mellitus with diabetic polyneuropathy, without long-term current use of insulin  (HCC) 12/03/2018   Type 2 diabetes mellitus with hyperglycemia, without long-term current use of insulin  (HCC) 05/15/2018    Past Surgical History:  Procedure Laterality Date   CATARACT EXTRACTION Right 11/08/2023   CESAREAN SECTION     x4   CHOLECYSTECTOMY  2004   EYE SURGERY Left    cataract   EYE SURGERY Left 10/2023   retinal surgery x2   HYSTEROSCOPY N/A 10/01/2023   Procedure: ABLATION, ENDOMETRIUM, HYSTEROSCOPIC;  Surgeon: Alger Gong, MD;  Location: [facility] OR;  Service: Gynecology;  Laterality: N/A;   IR BONE MARROW BIOPSY & ASPIRATION  08/01/2023   LUMBAR LAMINECTOMY N/A 05/30/2020   Procedure: Lumbar five Laminectomy,  Bilateral Microdiscectomy, Left Lumbar five -Sacral one Microdiscectomy;  Surgeon: Barbarann Oneil BROCKS, MD;  Location: [facility];  Service: Orthopedics;  Laterality: N/A;   LUMBAR LAMINECTOMY Left 11/14/2020   Procedure: LEFT LUMBAR FOUR-FIVE MICRODISCECTOMY;  Surgeon: Barbarann Oneil BROCKS, MD;  Location: [facility];  Service: Orthopedics;  Laterality: Left;   TUBAL LIGATION  10/19/2010    OB History     Gravida  6   Para      Term      Preterm      AB      Living  3      SAB      IAB      Ectopic      Multiple      Live Births               Home Medications    Prior to Admission medications   Medication Sig Start Date End Date Taking? Authorizing Provider  cefdinir  (OMNICEF ) 300 MG capsule Take 2 capsules (600 mg total) by mouth daily for 7 days. 01/10/24 01/17/24 Yes Vonna Sharlet POUR, MD  acetaminophen  (TYLENOL ) 500 MG tablet Take 1,000 mg by mouth  every 6 (six) hours as needed for mild pain (pain score 1-3) or moderate pain (pain score 4-6).    [provider]  albuterol  (VENTOLIN  HFA) 108 (90 Base) MCG/ACT inhaler Inhale 2 puffs into the lungs every 6 (six) hours as needed for wheezing or shortness of breath. 05/29/23   Setzer, Nena PARAS, PA-C  amitriptyline  (ELAVIL ) 10 MG tablet Take 10 mg by mouth at bedtime. 10/22/23   [provider]  amLODipine  (NORVASC ) 5 MG tablet Take 1 tablet (5 mg total) by mouth daily. 11/24/23 12/24/23  Arrien, Elidia Sieving, MD  brimonidine  (ALPHAGAN ) 0.2 % ophthalmic solution Place 1 drop into the left eye 2 (two) times daily. Patient not taking: Reported on 01/10/2024 11/19/23   [provider]  Continuous Glucose Sensor (DEXCOM G7 SENSOR) MISC 1 Device by Does not apply route as directed. Change sensor every  10 days 06/21/23   Shamleffer, Ibtehal Jaralla, MD  Cyanocobalamin  (VITAMIN B-12) 2500 MCG SUBL Place 1 tablet (2,500 mcg total) under the tongue daily. 04/23/23   Babara Call, MD  dorzolamide -timolol  (COSOPT ) 2-0.5 % ophthalmic solution Place 1 drop into the left eye 2 (two) times daily. Patient not taking: Reported on 01/10/2024 11/19/23   [provider]  ferrous sulfate  325 (65 FE) MG tablet Take 1 tablet (325 mg total) by mouth daily with breakfast. 05/29/23   Setzer, Sandra J, PA-C  fluticasone  (FLONASE ) 50 MCG/ACT nasal spray Place 2 sprays into both nostrils daily. Patient not taking: Reported on 01/10/2024 10/31/23   Odell Celinda Balo, MD  furosemide  (LASIX ) 20 MG tablet TAKE 1 TABLET(20 MG) BY MOUTH IN THE MORNING 11/14/23   Tolia, Sunit, DO  gabapentin  (NEURONTIN ) 800 MG tablet Take 800 mg by mouth 3 (three) times daily.    [provider]  Galcanezumab -gnlm (EMGALITY ) 120 MG/ML SOAJ Inject 120 mg into the skin every 30 (thirty) days. Patient not taking: Reported on 12/27/2023 08/20/23   Gayland Lauraine PARAS, NP  Insulin  Disposable Pump (OMNIPOD 5 G7 PODS, GEN 5,) MISC 1  Device by Does not apply route every other day. Patient not taking: Reported on 12/27/2023 09/26/23   Shamleffer, Ibtehal Jaralla, MD  insulin  glargine (LANTUS  SOLOSTAR) 100 UNIT/ML Solostar Pen Inject 50 Units into the skin daily before breakfast. 12/27/23   Shamleffer, Ibtehal Jaralla, MD  insulin  lispro (HUMALOG  KWIKPEN) 100 UNIT/ML KwikPen Inject 12-22 Units into the skin 3 (three) times daily. Max daily 09/26/23   Shamleffer, Ibtehal Jaralla, MD  Insulin  Pen Needle 32G X 4 MM MISC 1 Device by Does not apply route in the morning, at noon, in the evening, and at bedtime. 09/26/23   Shamleffer, Ibtehal Jaralla, MD  ketorolac  (ACULAR ) 0.5 % ophthalmic solution Place 1 drop into the right eye 4 (four) times daily. Patient not taking: Reported on 01/10/2024 10/24/23   [provider]  metoprolol  tartrate (LOPRESSOR ) 25 MG tablet Take 1 tablet (25 mg total) by mouth 2 (two) times daily. 11/24/23   Arrien, Mauricio Daniel, MD  mometasone -formoterol  (DULERA ) 100-5 MCG/ACT AERO Inhale 2 puffs into the lungs 2 (two) times daily at 10 AM and 5 PM. 05/29/23   Setzer, Sandra J, PA-C  Multiple Vitamin (MULTIVITAMIN WITH MINERALS) TABS tablet Take 1 tablet by mouth daily. 05/29/23   Setzer, Sandra J, PA-C  ofloxacin  (OCUFLOX ) 0.3 % ophthalmic solution Place 1 drop into the right eye 4 (four) times daily. 10/24/23   [provider]  oxyCODONE -acetaminophen  (PERCOCET) 7.5-325 MG tablet Take 1 tablet by mouth 4 (four) times daily as needed for moderate pain (pain score 4-6). 10/22/23   [provider]  pantoprazole  (PROTONIX ) 40 MG tablet Take 1 tablet (40 mg total) by mouth daily. 05/29/23   Setzer, Sandra J, PA-C  prednisoLONE  acetate (PRED FORTE ) 1 % ophthalmic suspension Place 1 drop into the right eye 4 (four) times daily. 10/24/23   [provider]  Rimegepant Sulfate (NURTEC) 75 MG TBDP Take 1 tablet (75 mg total) by mouth as needed. Take 1 tablet at onset of headache, max is 1 tablet in  24 hours. 12/10/23   Gayland Lauraine PARAS, NP  tiZANidine  (ZANAFLEX ) 4 MG tablet Take 4 mg by mouth every 12 (twelve) hours as needed for muscle spasms. 06/10/23   [provider]  vitamin C  (ASCORBIC ACID ) 250 MG tablet Take 2 tablets (500 mg total) by mouth daily. 04/10/22   Babara,  Zelphia, MD    Family History Family History  Problem Relation Age of Onset   Diabetes Mother    Hypertension Mother    Stroke Mother    Healthy Sister    Hypertension Brother    Asthma Brother    Hypertension Brother    Healthy Son    Healthy Daughter    Healthy Daughter     Social History Social History   Tobacco Use   Smoking status: Former    Types: Cigars    Quit date: 12/23/2021    Years since quitting: 2.0    Passive exposure: Never   Smokeless tobacco: Never   Tobacco comments:    2 cigars daily. Khj 02/12/2023  Vaping Use   Vaping status: Never Used  Substance Use Topics   Alcohol  use: Not Currently   Drug use: Not Currently    Comment: per patient stopped smoking marijuana Mar 2022     Allergies   Trulicity  [dulaglutide ]   Review of Systems Review of Systems  Respiratory:  Positive for cough.      Physical Exam Triage Vital Signs ED Triage Vitals [01/10/24 1228]  Encounter Vitals Group     BP (!) 146/80     Girls Systolic BP Percentile      Girls Diastolic BP Percentile      Boys Systolic BP Percentile      Boys Diastolic BP Percentile      Pulse Rate 84     Resp 16     Temp 98.2 F (36.8 C)     Temp Source Oral     SpO2 98 %     Weight      Height      Head Circumference      Peak Flow      Pain Score 0     Pain Loc      Pain Education      Exclude from Growth Chart    No data found.  Updated Vital Signs BP (!) 146/80 (BP Location: Right Arm)   Pulse 84   Temp 98.2 F (36.8 C) (Oral)   Resp 16   LMP 12/23/2023 (Exact Date)   SpO2 98%   Visual Acuity Right Eye Distance:   Left Eye Distance:   Bilateral Distance:    Right Eye Near:   Left Eye  Near:    Bilateral Near:     Physical Exam Vitals reviewed.  Constitutional:      General: She is not in acute distress.    Appearance: She is not ill-appearing, toxic-appearing or diaphoretic.  HENT:     Right Ear: Tympanic membrane and ear canal normal.     Left Ear: Tympanic membrane and ear canal normal.     Nose: Congestion present.     Mouth/Throat:     Mouth: Mucous membranes are moist.     Comments: No erythema.  There is white and clear mucus draining Eyes:     Extraocular Movements: Extraocular movements intact.     Conjunctiva/sclera: Conjunctivae normal.     Pupils: Pupils are equal, round, and reactive to light.  Cardiovascular:     Rate and Rhythm: Normal rate and regular rhythm.     Heart sounds: No murmur heard. Pulmonary:     Effort: Pulmonary effort is normal. No respiratory distress.     Breath sounds: No stridor. No wheezing, rhonchi or rales.  Musculoskeletal:     Cervical back: Neck supple.  Lymphadenopathy:  Cervical: No cervical adenopathy.  Skin:    Capillary Refill: Capillary refill takes less than 2 seconds.     Coloration: Skin is not jaundiced or pale.  Neurological:     General: No focal deficit present.     Mental Status: She is alert and oriented to person, place, and time.  Psychiatric:        Behavior: Behavior normal.      UC Treatments / Results  Labs (all labs ordered are listed, but only abnormal results are displayed) Labs Reviewed - No data to display  EKG   Radiology No results found.  Procedures Procedures (including critical care time)  Medications Ordered in UC Medications - No data to display  Initial Impression / Assessment and Plan / UC Course  I have reviewed the triage vital signs and the nursing notes.  Pertinent labs & imaging results that were available during my care of the patient were reviewed by me and considered in my medical decision making (see chart for details).     Omnicef  is sent in for  sinusitis.  I have recommended Mucinex  to help with the mucus production.   Final Clinical Impressions(s) / UC Diagnoses   Final diagnoses:  Acute sinusitis, recurrence not specified, unspecified location     Discharge Instructions      Take cefdinir  300 mg--2 capsules together daily for 7 days  Take Mucinex  or Robitussin as needed for thick congestion and mucus.  Make sure you are drinking plenty of fluids.     ED Prescriptions     Medication Sig Dispense Auth. Provider   cefdinir  (OMNICEF ) 300 MG capsule Take 2 capsules (600 mg total) by mouth daily for 7 days. 14 capsule Vonna, Malanie Koloski K, MD      PDMP not reviewed this encounter.   Vonna Sharlet POUR, MD 01/10/24 1256

## 2024-01-10 NOTE — ED Triage Notes (Signed)
 Patient c/o a productive cough with yellow sputum and hoarseness x 1 week.  Patient states she has been taking Nyquil and Benadryl  for her symptoms.

## 2024-01-13 ENCOUNTER — Encounter: Payer: Self-pay | Admitting: Nurse Practitioner

## 2024-01-13 ENCOUNTER — Ambulatory Visit: Admitting: Nurse Practitioner

## 2024-01-13 VITALS — BP 118/72 | HR 88 | Temp 98.5°F | Ht 69.0 in | Wt 219.4 lb

## 2024-01-13 DIAGNOSIS — J4540 Moderate persistent asthma, uncomplicated: Secondary | ICD-10-CM

## 2024-01-13 DIAGNOSIS — R0602 Shortness of breath: Secondary | ICD-10-CM

## 2024-01-13 DIAGNOSIS — J4541 Moderate persistent asthma with (acute) exacerbation: Secondary | ICD-10-CM

## 2024-01-13 DIAGNOSIS — R911 Solitary pulmonary nodule: Secondary | ICD-10-CM | POA: Diagnosis not present

## 2024-01-13 DIAGNOSIS — F17210 Nicotine dependence, cigarettes, uncomplicated: Secondary | ICD-10-CM

## 2024-01-13 DIAGNOSIS — J019 Acute sinusitis, unspecified: Secondary | ICD-10-CM

## 2024-01-13 DIAGNOSIS — Z720 Tobacco use: Secondary | ICD-10-CM

## 2024-01-13 LAB — NITRIC OXIDE: Nitric Oxide: 27

## 2024-01-13 MED ORDER — PREDNISONE 20 MG PO TABS: 20.0000 mg | ORAL_TABLET | Freq: Every day | ORAL | 0 refills | Status: AC

## 2024-01-13 NOTE — Assessment & Plan Note (Addendum)
 New 5 mm nodule. Hx of waxing and waning nodularity and LAD. Recommend repeat CT chest in 6 months due March 2026

## 2024-01-13 NOTE — Progress Notes (Signed)
 @Patient  ID: Melinda Stone, female    DOB: 1980-11-07, 43 y.o.   MRN: [id number]  Chief Complaint  Patient presents with   Asthma    Referring provider: Luba Stabs, DO  HPI: 43 year old female, active smoker followed for asthma. She is a patient of Dr. Tamea and last seen in office 02/12/2023. Past medical history significant for migraines, HTN, GERD, DM, chronic back pain, CKD, GIDD, anxiety.   TEST/EVENTS:  08/30/2021: FVC 82, FEV1 77, ratio 78, moderately reduced DLCO but corrects to normal for alveolar volume. No BD 10/24/2021 HST: AHI 0, SpO2 low 91% >> no OSA 12/27/2021 HRCT chest: no ILD. No active pulmonary disease. 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   02/12/2023: OV with Dr. Tamea. Improved breathing. No need for inhaler. Having some difficulties with leg pain with prolonged walking, which does cause weakness and SOB. On pain medication; ineffective. Recently underwent MRI, suspected nerve-related originating from the back. Awaiting consultation with orthopedic specialist. Active smoker.   01/13/2024: Today - follow up Discussed the use of AI scribe software for clinical note transcription with the patient, who gave verbal consent to proceed.  History of Present Illness Melinda Stone is a 43 year old female with asthma who presents with persistent cough and wheezing.  She has been experiencing persistent cough and wheezing, which she attributes to a recent sinus infection. She was prescribed antibiotics for the sinus infection 10/24 and is starting to feel better since the treatment. Still coughing, which is primarily dry. No fevers, chills, hemoptysis. No headaches, sore throat, ear pain. No chest tightness. Breathing feels stable from her baseline.   She uses Dulera  twice daily and her rescue inhaler two to three times a week, which she feels helps her symptoms. Has cut back on smoking from 5 cigarettes a day to 2.    She uses Flonase  nasal spray inconsistently. No other over the counter allergy regimen.   Eating and drinking well. No low O2 levels.   Lab Results  Component Value Date   NITRICOXIDE 27 01/13/2024   This result suggests intermediate (25-49) Type 2 (T2) airway inflammation; clinical correlation required.     Allergies  Allergen Reactions   Trulicity  [Dulaglutide ] Diarrhea    Immunization History  Administered Date(s) Administered   Influenza-Unspecified 12/18/2022   Janssen (J&J) SARS-COV-2 Vaccination 08/25/2019   Pneumococcal Polysaccharide-23 05/09/2020   Tdap 08/04/2022    Past Medical History:  Diagnosis Date   Anemia    Arthritis 08/17/2020   Chronic back pain    Diabetes mellitus without complication (HCC)    type 2   GERD (gastroesophageal reflux disease)    Hypertension    Migraine    Ovarian cyst    Pneumonia    Polyneuropathy     Tobacco History: Social History   Tobacco Use  Smoking Status Every Day   Types: Cigars   Last attempt to quit: 12/23/2021   Years since quitting: 2.0   Passive exposure: Never  Smokeless Tobacco Never   Ready to quit: Not Answered Counseling given: Not Answered   Outpatient Medications Prior to Visit  Medication Sig Dispense Refill   acetaminophen  (TYLENOL ) 500 MG tablet Take 1,000 mg by mouth every 6 (six) hours as needed for mild pain (pain score 1-3) or moderate pain (pain score 4-6).     albuterol  (VENTOLIN  HFA) 108 (90 Base) MCG/ACT inhaler Inhale 2 puffs into the lungs every 6 (six) hours as needed for wheezing or shortness  of breath. 18 g 2   amitriptyline  (ELAVIL ) 10 MG tablet Take 10 mg by mouth at bedtime.     amLODipine  (NORVASC ) 5 MG tablet Take 1 tablet (5 mg total) by mouth daily. 30 tablet 0   cefdinir  (OMNICEF ) 300 MG capsule Take 2 capsules (600 mg total) by mouth daily for 7 days. 14 capsule 0   Continuous Glucose Sensor (DEXCOM G7 SENSOR) MISC 1 Device by Does not apply route as directed. Change  sensor every 10 days 9 each 3   Cyanocobalamin  (VITAMIN B-12) 2500 MCG SUBL Place 1 tablet (2,500 mcg total) under the tongue daily. 30 tablet 2   ferrous sulfate  325 (65 FE) MG tablet Take 1 tablet (325 mg total) by mouth daily with breakfast. 30 tablet 0   furosemide  (LASIX ) 20 MG tablet TAKE 1 TABLET(20 MG) BY MOUTH IN THE MORNING 90 tablet 2   gabapentin  (NEURONTIN ) 800 MG tablet Take 800 mg by mouth 3 (three) times daily.     Galcanezumab -gnlm (EMGALITY ) 120 MG/ML SOAJ Inject 120 mg into the skin every 30 (thirty) days. 1.12 mL 11   Insulin  Disposable Pump (OMNIPOD 5 G7 PODS, GEN 5,) MISC 1 Device by Does not apply route every other day. 45 each 3   insulin  glargine (LANTUS  SOLOSTAR) 100 UNIT/ML Solostar Pen Inject 50 Units into the skin daily before breakfast. 45 mL 4   insulin  lispro (HUMALOG  KWIKPEN) 100 UNIT/ML KwikPen Inject 12-22 Units into the skin 3 (three) times daily. Max daily 60 mL 4   Insulin  Pen Needle 32G X 4 MM MISC 1 Device by Does not apply route in the morning, at noon, in the evening, and at bedtime. 400 each 3   metoprolol  tartrate (LOPRESSOR ) 25 MG tablet Take 1 tablet (25 mg total) by mouth 2 (two) times daily. 60 tablet 0   mometasone -formoterol  (DULERA ) 100-5 MCG/ACT AERO Inhale 2 puffs into the lungs 2 (two) times daily at 10 AM and 5 PM. 13 g 0   Multiple Vitamin (MULTIVITAMIN WITH MINERALS) TABS tablet Take 1 tablet by mouth daily.     oxyCODONE -acetaminophen  (PERCOCET) 7.5-325 MG tablet Take 1 tablet by mouth 4 (four) times daily as needed for moderate pain (pain score 4-6).     pantoprazole  (PROTONIX ) 40 MG tablet Take 1 tablet (40 mg total) by mouth daily. 30 tablet 0   Rimegepant Sulfate (NURTEC) 75 MG TBDP Take 1 tablet (75 mg total) by mouth as needed. Take 1 tablet at onset of headache, max is 1 tablet in 24 hours. 8 tablet 11   tiZANidine  (ZANAFLEX ) 4 MG tablet Take 4 mg by mouth every 12 (twelve) hours as needed for muscle spasms.     vitamin C  (ASCORBIC  ACID) 250 MG tablet Take 2 tablets (500 mg total) by mouth daily. 180 tablet 0   fluticasone  (FLONASE ) 50 MCG/ACT nasal spray Place 2 sprays into both nostrils daily. (Patient not taking: Reported on 01/13/2024) 16 g 0   brimonidine  (ALPHAGAN ) 0.2 % ophthalmic solution Place 1 drop into the left eye 2 (two) times daily. (Patient not taking: Reported on 01/13/2024)     dorzolamide -timolol  (COSOPT ) 2-0.5 % ophthalmic solution Place 1 drop into the left eye 2 (two) times daily. (Patient not taking: Reported on 01/10/2024)     ketorolac  (ACULAR ) 0.5 % ophthalmic solution Place 1 drop into the right eye 4 (four) times daily. (Patient not taking: Reported on 01/13/2024)     ofloxacin  (OCUFLOX ) 0.3 % ophthalmic solution Place 1 drop into  the right eye 4 (four) times daily.     prednisoLONE  acetate (PRED FORTE ) 1 % ophthalmic suspension Place 1 drop into the right eye 4 (four) times daily.     No facility-administered medications prior to visit.     Review of Systems: as above    Physical Exam:  BP 118/72   Pulse 88   Temp 98.5 F (36.9 C) (Temporal)   Ht 5' 9 (1.753 m)   Wt 219 lb 6.4 oz (99.5 kg)   LMP 12/23/2023 (Exact Date)   SpO2 100%   BMI 32.40 kg/m   GEN: Pleasant, interactive, well-kempt; in no acute distress HEENT:  Normocephalic and atraumatic. PERRLA. Sclera white. Nasal turbinates pink, moist and patent bilaterally. Clear rhinorrhea present. Oropharynx pink and moist, without exudate or edema. No lesions, ulcerations, or postnasal drip.  NECK:  Supple w/ fair ROM. Cervical lymphadenopathy.   CV: RRR, no m/r/g, no peripheral edema. Pulses intact, +2 bilaterally. No cyanosis, pallor or clubbing. PULMONARY:  Unlabored, regular breathing. Diminished bibasilar airflow otherwise bilaterally A&P w/o wheezes/rales/rhonchi. No accessory muscle use.  GI: BS present and normoactive. Soft, non-tender to palpation.  MSK: No erythema, warmth or tenderness. Neuro: A/Ox3. No focal deficits  noted.   Skin: Warm, no lesions or rashe Psych: Normal affect and behavior. Judgement and thought content appropriate.     Lab Results:  CBC    Component Value Date/Time   WBC 8.0 12/24/2023 1538   RBC 3.60 (L) 12/24/2023 1538   RBC 3.62 (L) 12/24/2023 1538   HGB 10.0 (L) 12/24/2023 1538   HGB 9.4 (L) 12/19/2023 1345   HGB 9.0 (L) 12/12/2023 1103   HCT 30.7 (L) 12/24/2023 1538   HCT 28.1 (L) 12/24/2023 1538   PLT 183 12/24/2023 1538   PLT 134 (L) 12/19/2023 1345   PLT 189 05/30/2022 1022   MCV 77.6 (L) 12/24/2023 1538   MCV 83 05/30/2022 1022   MCH 27.6 12/24/2023 1538   MCHC 35.6 12/24/2023 1538   RDW 17.0 (H) 12/24/2023 1538   RDW 18.5 (H) 05/30/2022 1022   LYMPHSABS 1.6 12/24/2023 1538   LYMPHSABS 2.0 10/20/2018 1002   MONOABS 0.5 12/24/2023 1538   EOSABS 0.0 12/24/2023 1538   EOSABS 0.1 10/20/2018 1002   BASOSABS 0.1 12/24/2023 1538   BASOSABS 0.1 10/20/2018 1002    BMET    Component Value Date/Time   NA 133 (L) 12/24/2023 1538   NA 137 07/23/2023 1200   K 6.1 (H) 12/24/2023 1538   CL 105 12/24/2023 1538   CO2 18 (L) 12/24/2023 1538   GLUCOSE 387 (H) 12/24/2023 1538   BUN 21 (H) 12/24/2023 1538   BUN 23 07/23/2023 1200   CREATININE 1.09 (H) 12/24/2023 1538   CALCIUM  9.0 12/24/2023 1538   GFRNONAA >60 12/24/2023 1538   GFRAA 139 04/28/2020 1124    BNP    Component Value Date/Time   BNP 204.2 (H) 10/28/2023 1557     Imaging:  No results found.  Transfuse RBC     Date Action Dose Route User   Discharged on 01/10/2024   Admitted on 01/10/2024   11/29/2023 1315 Rate/Dose Change (none) Intravenous Towana Sor, RN   11/29/2023 1300 New Bag/Given (none) Intravenous Towana Sor, RN      acetaminophen  (TYLENOL ) tablet 650 mg     Date Action Dose Route User   Discharged on 01/10/2024   Admitted on 01/10/2024   11/29/2023 1237 Given 650 mg Oral Towana Sor, RN  diphenhydrAMINE  (BENADRYL ) capsule 25 mg     Date Action Dose Route User    Discharged on 01/10/2024   Admitted on 01/10/2024   11/29/2023 1237 Given 25 mg Oral Towana Sor, RN      epoetin  alfa-epbx (RETACRIT ) injection 40,000 Units     Date Action Dose Route User   Discharged on 01/10/2024   Admitted on 01/10/2024   11/28/2023 1358 Given 40,000 Units Subcutaneous (Right Arm) Claudene Powell CROME, CMA      epoetin  alfa-epbx (RETACRIT ) injection 40,000 Units     Date Action Dose Route User   Discharged on 01/10/2024   Admitted on 01/10/2024   12/05/2023 1423 Given 40,000 Units Subcutaneous (Right Arm) Claudene Powell CROME, CMA      epoetin  alfa-epbx (RETACRIT ) injection 40,000 Units     Date Action Dose Route User   Discharged on 01/10/2024   Admitted on 01/10/2024   12/19/2023 1428 Given 40,000 Units Subcutaneous (Left Arm) Claudene Powell CROME, CMA      0.9 %  sodium chloride  infusion (Manually program via Guardrails IV Fluids)     Date Action Dose Route User   Discharged on 01/10/2024   Admitted on 01/10/2024   11/29/2023 1241 New Bag/Given 100 mL Intravenous Towana Sor, RN          Latest Ref Rng & Units 08/30/2021   12:43 PM  PFT Results  FVC-Pre L 3.02   FVC-Predicted Pre % 82   FVC-Post L 3.00   FVC-Predicted Post % 81   Pre FEV1/FVC % % 78   Post FEV1/FCV % % 74   FEV1-Pre L 2.35   FEV1-Predicted Pre % 77   FEV1-Post L 2.23   DLCO uncorrected ml/min/mmHg 13.62   DLCO UNC% % 52   DLVA Predicted % 82   TLC L 4.12   TLC % Predicted % 70   RV % Predicted % 79     Lab Results  Component Value Date   NITRICOXIDE 27 01/13/2024        Assessment & Plan:   Moderate persistent asthma Mild asthma exacerbation related to URI. Slightly elevated exhaled nitric oxide testing. No significant bronchospasm but does have bronchitic cough. Will treat her with prednisone  burst. Continue bronchodilator regimen. Action plan in place. Complete abx for sinusitis.   Patient Instructions  Continue Dulera  2 puffs Twice daily. Brush tongue and  rinse mouth afterwards Continue Albuterol  inhaler 2 puffs every 6 hours as needed for shortness of breath or wheezing. Notify if symptoms persist despite rescue inhaler/neb use.  Continue flonase  nasal spray 2 sprays each nostril daily   Prednisone  20 mg daily for 5 days. Take in AM with food  Complete cefdinir  as prescribed   Keep working on quitting smoking   Follow up in 3-4 months with Dr. Tamea. If symptoms do not improve or worsen, please contact office for sooner follow up or seek emergency care.    Acute sinusitis See above. Continue supportive care measures. Advised to utilize flonase  consistently until symptoms resolve  Tobacco use Active smoker. Smoking cessation advised  Lung nodule New 5 mm nodule. Hx of waxing and waning nodularity and LAD. Recommend repeat CT chest in 6 months due March 2026   Advised if symptoms do not improve or worsen, to please contact office for sooner follow up or seek emergency care.   I spent 35 minutes of dedicated to the care of this patient on the date of this encounter to include pre-visit review of records, face-to-face  time with the patient discussing conditions above, post visit ordering of testing, clinical documentation with the electronic health record, making appropriate referrals as documented, and communicating necessary findings to members of the patients care team.  Comer LULLA Rouleau, NP 01/13/2024  Pt aware and understands NP's role.

## 2024-01-13 NOTE — Assessment & Plan Note (Signed)
 See above. Continue supportive care measures. Advised to utilize flonase  consistently until symptoms resolve

## 2024-01-13 NOTE — Patient Instructions (Addendum)
 Continue Dulera  2 puffs Twice daily. Brush tongue and rinse mouth afterwards Continue Albuterol  inhaler 2 puffs every 6 hours as needed for shortness of breath or wheezing. Notify if symptoms persist despite rescue inhaler/neb use.  Continue flonase  nasal spray 2 sprays each nostril daily   Prednisone  20 mg daily for 5 days. Take in AM with food  Complete cefdinir  as prescribed   Keep working on quitting smoking   Follow up in 3-4 months with Dr. Tamea. If symptoms do not improve or worsen, please contact office for sooner follow up or seek emergency care.

## 2024-01-13 NOTE — Assessment & Plan Note (Signed)
 Mild asthma exacerbation related to URI. Slightly elevated exhaled nitric oxide testing. No significant bronchospasm but does have bronchitic cough. Will treat her with prednisone  burst. Continue bronchodilator regimen. Action plan in place. Complete abx for sinusitis.   Patient Instructions  Continue Dulera  2 puffs Twice daily. Brush tongue and rinse mouth afterwards Continue Albuterol  inhaler 2 puffs every 6 hours as needed for shortness of breath or wheezing. Notify if symptoms persist despite rescue inhaler/neb use.  Continue flonase  nasal spray 2 sprays each nostril daily   Prednisone  20 mg daily for 5 days. Take in AM with food  Complete cefdinir  as prescribed   Keep working on quitting smoking   Follow up in 3-4 months with Dr. Tamea. If symptoms do not improve or worsen, please contact office for sooner follow up or seek emergency care.

## 2024-01-13 NOTE — Addendum Note (Signed)
 Addended by: Aneyah Lortz V on: 01/13/2024 09:52 AM   Modules accepted: Orders

## 2024-01-13 NOTE — Assessment & Plan Note (Signed)
 Active smoker. Smoking cessation advised.

## 2024-01-23 ENCOUNTER — Telehealth: Payer: Self-pay

## 2024-01-23 NOTE — Telephone Encounter (Signed)
 Spoke with patient and confirmed appointment on 11/7

## 2024-01-24 ENCOUNTER — Other Ambulatory Visit: Payer: Self-pay

## 2024-01-24 ENCOUNTER — Inpatient Hospital Stay: Attending: Oncology | Admitting: Hematology and Oncology

## 2024-01-24 ENCOUNTER — Inpatient Hospital Stay

## 2024-01-24 VITALS — BP 120/59 | HR 85 | Temp 98.4°F | Resp 18 | Ht 69.0 in | Wt 224.3 lb

## 2024-01-24 DIAGNOSIS — F17290 Nicotine dependence, other tobacco product, uncomplicated: Secondary | ICD-10-CM | POA: Diagnosis not present

## 2024-01-24 DIAGNOSIS — D649 Anemia, unspecified: Secondary | ICD-10-CM | POA: Diagnosis not present

## 2024-01-24 DIAGNOSIS — N920 Excessive and frequent menstruation with regular cycle: Secondary | ICD-10-CM | POA: Diagnosis not present

## 2024-01-24 DIAGNOSIS — D500 Iron deficiency anemia secondary to blood loss (chronic): Secondary | ICD-10-CM | POA: Insufficient documentation

## 2024-01-24 DIAGNOSIS — I129 Hypertensive chronic kidney disease with stage 1 through stage 4 chronic kidney disease, or unspecified chronic kidney disease: Secondary | ICD-10-CM | POA: Diagnosis not present

## 2024-01-24 DIAGNOSIS — E1122 Type 2 diabetes mellitus with diabetic chronic kidney disease: Secondary | ICD-10-CM | POA: Diagnosis not present

## 2024-01-24 DIAGNOSIS — D589 Hereditary hemolytic anemia, unspecified: Secondary | ICD-10-CM | POA: Insufficient documentation

## 2024-01-24 DIAGNOSIS — N189 Chronic kidney disease, unspecified: Secondary | ICD-10-CM | POA: Insufficient documentation

## 2024-01-24 LAB — IRON AND IRON BINDING CAPACITY (CC-WL,HP ONLY)
Iron: 136 ug/dL (ref 28–170)
Saturation Ratios: 42 % — ABNORMAL HIGH (ref 10.4–31.8)
TIBC: 321 ug/dL (ref 250–450)
UIBC: 185 ug/dL (ref 148–442)

## 2024-01-24 LAB — LACTATE DEHYDROGENASE: LDH: 184 U/L (ref 98–192)

## 2024-01-24 LAB — CMP (CANCER CENTER ONLY)
ALT: 5 U/L (ref 0–44)
AST: <10 U/L — ABNORMAL LOW (ref 15–41)
Albumin: 3.7 g/dL (ref 3.5–5.0)
Alkaline Phosphatase: 120 U/L (ref 38–126)
Anion gap: 4 — ABNORMAL LOW (ref 5–15)
BUN: 15 mg/dL (ref 6–20)
CO2: 18 mmol/L — ABNORMAL LOW (ref 22–32)
Calcium: 8.6 mg/dL — ABNORMAL LOW (ref 8.9–10.3)
Chloride: 112 mmol/L — ABNORMAL HIGH (ref 98–111)
Creatinine: 0.98 mg/dL (ref 0.44–1.00)
GFR, Estimated: >60 mL/min (ref >60)
Glucose, Bld: 266 mg/dL — ABNORMAL HIGH (ref 70–99)
Potassium: 5.7 mmol/L — ABNORMAL HIGH (ref 3.5–5.1)
Sodium: 134 mmol/L — ABNORMAL LOW (ref 135–145)
Total Bilirubin: 0.8 mg/dL (ref 0.0–1.2)
Total Protein: 6.1 g/dL — ABNORMAL LOW (ref 6.5–8.1)

## 2024-01-24 LAB — CBC WITH DIFFERENTIAL/PLATELET
Abs Immature Granulocytes: 0.04 K/uL (ref 0.00–0.07)
Basophils Absolute: 0.1 K/uL (ref 0.0–0.1)
Basophils Relative: 1 %
Eosinophils Absolute: 0 K/uL (ref 0.0–0.5)
Eosinophils Relative: 0 %
HCT: 24.7 % — ABNORMAL LOW (ref 36.0–46.0)
Hemoglobin: 8.6 g/dL — ABNORMAL LOW (ref 12.0–15.0)
Immature Granulocytes: 1 %
Lymphocytes Relative: 21 %
Lymphs Abs: 1.6 K/uL (ref 0.7–4.0)
MCH: 28.5 pg (ref 26.0–34.0)
MCHC: 34.8 g/dL (ref 30.0–36.0)
MCV: 81.8 fL (ref 80.0–100.0)
Monocytes Absolute: 0.4 K/uL (ref 0.1–1.0)
Monocytes Relative: 6 %
Neutro Abs: 5.4 K/uL (ref 1.7–7.7)
Neutrophils Relative %: 71 %
Platelets: 171 K/uL (ref 150–400)
RBC: 3.02 MIL/uL — ABNORMAL LOW (ref 3.87–5.11)
RDW: 18.6 % — ABNORMAL HIGH (ref 11.5–15.5)
WBC: 7.5 K/uL (ref 4.0–10.5)
nRBC: 0 % (ref 0.0–0.2)

## 2024-01-24 LAB — RETICULOCYTES
Immature Retic Fract: 6.5 % (ref 2.3–15.9)
RBC.: 3.12 MIL/uL — ABNORMAL LOW (ref 3.87–5.11)
Retic Count, Absolute: 239.1 K/uL — ABNORMAL HIGH (ref 19.0–186.0)
Retic Ct Pct: 7.7 % — ABNORMAL HIGH (ref 0.4–3.1)

## 2024-01-24 LAB — FERRITIN: Ferritin: 350 ng/mL — ABNORMAL HIGH (ref 11–307)

## 2024-01-24 NOTE — Progress Notes (Signed)
 [facility] CONSULT NOTE  Patient Care Team: [name], NP as PCP - General (Nurse Practitioner) Michele Richardson, DO as PCP - Cardiology (Cardiology) Babara Call, MD as Consulting Physician (Oncology)  CHIEF COMPLAINTS/PURPOSE OF CONSULTATION:  Hemolytic anemia  ASSESSMENT & PLAN:  No problem-specific Assessment & Plan notes found for this encounter.  No orders of the defined types were placed in this encounter.  This is a 43 year old female patient who was previously seen by Dr.Yu for the past several years and has been referred to us  for second opinion given ongoing hemolytic anemia  Assessment and Plan Assessment & Plan Chronic hemolytic anemia, she has elevated retic count, normal bili, low haptoglobin Chronic hemolytic anemia with mild hemolysis, requiring intermittent transfusions. Negative Coombs, PNH, and autoimmune workup suggest non-classic etiology.   - Labs from this visit with stable Hb, no indication for transfusion - Consider re challenge of steroids if hemolysis persists and transfusions are frequent. - We can consider alpha thal testing, G6PD deficiency and other enzyme deficiency such as pyruvate kinase deficiency or IG A mediated human autoimmune hemolytic anemia as possible differential diagnosis.  Iron  deficiency anemia due to menorrhagia Iron  deficiency anemia likely secondary to menorrhagia.  Currently she is not iron  deficiency, no indicaton for transfusion  Chronic kidney disease Chronic kidney disease possibly contributing to anemia. Uncontrolled diabetes and hypertension may be factors.  Type 2 diabetes mellitus, uncontrolled Uncontrolled type 2 diabetes mellitus with recent hemoglobin A1c of 7.5%.    Weight HISTORY OF PRESENTING ILLNESS:  Melinda Stone 43 y.o. female is here because of hemolytic anemia  This is followed by hematology over the past several years patient has been extensively evaluated for underlying hemolytic  anemia. Very complex history, multiple hospitalization at times for blood loss anemia and some times for possible hemolysis. No evidence of nutritional deficiency She did have episodes of hemolysis in the past but not classic hemolysis and required blood transfusion, DAT neg, no cold agglutinin. She responded to steroids however she didn't tolerate them well, hence discontinued. Previous bone marrow biopsy showed Slightly hypercellular bone marrow with erythroid hyperplasia, No viral cytopathic changes or significant dyspoiesis. Suspect secondary process. Normal cytogenetics, normal myeloid NGS  MPN testing negative. CT chest abdomen pelvis with no lymphoproliferative disease. She says she wanted a second opinion since she doesn't seem to know why she is anemic. She came in a wheel chair today by herself. She denies any bleeding complaints at this time. She says she hasn't felt well in a long time and she hopes someone can help her. She says she has been needing transfusion approximately once a month. She was seen by Dr Babara for many yrs and wanted a second opinion  Interval History    MEDICAL HISTORY:  Past Medical History:  Diagnosis Date   Anemia    Arthritis 08/17/2020   Chronic back pain    Diabetes mellitus without complication (HCC)    type 2   GERD (gastroesophageal reflux disease)    Hypertension    Migraine    Ovarian cyst    Pneumonia    Polyneuropathy     SURGICAL HISTORY: Past Surgical History:  Procedure Laterality Date   CATARACT EXTRACTION Right 11/08/2023   CESAREAN SECTION     x4   CHOLECYSTECTOMY  2004   EYE SURGERY Left    cataract   EYE SURGERY Left 10/2023   retinal surgery x2   HYSTEROSCOPY N/A 10/01/2023   Procedure: ABLATION, ENDOMETRIUM, HYSTEROSCOPIC;  Surgeon: Alger Gong, MD;  Location: [facility] OR;  Service: Gynecology;  Laterality: N/A;   IR BONE MARROW BIOPSY & ASPIRATION  08/01/2023   LUMBAR LAMINECTOMY N/A 05/30/2020   Procedure: Lumbar  five Laminectomy,  Bilateral Microdiscectomy, Left Lumbar five -Sacral one Microdiscectomy;  Surgeon: Barbarann Oneil BROCKS, MD;  Location: [facility] OR;  Service: Orthopedics;  Laterality: N/A;   LUMBAR LAMINECTOMY Left 11/14/2020   Procedure: LEFT LUMBAR FOUR-FIVE MICRODISCECTOMY;  Surgeon: Barbarann Oneil BROCKS, MD;  Location: [facility] OR;  Service: Orthopedics;  Laterality: Left;   TUBAL LIGATION  10/19/2010    SOCIAL HISTORY: Social History   Socioeconomic History   Marital status: Single    Spouse name: Not on file   Number of children: 3   Years of education: Not on file   Highest education level: High school graduate  Occupational History    Comment: home maker  Tobacco Use   Smoking status: Every Day    Types: Cigars    Last attempt to quit: 12/23/2021    Years since quitting: 2.0    Passive exposure: Never   Smokeless tobacco: Never  Vaping Use   Vaping status: Never Used  Substance and Sexual Activity   Alcohol  use: Not Currently   Drug use: Not Currently    Comment: per patient stopped smoking marijuana Mar 2022   Sexual activity: Yes    Birth control/protection: Surgical  Other Topics Concern   Not on file  Social History Narrative   Lives with 3 children   Some coffee   Right handed   Doesn't work   Social Drivers of Corporate Investment Banker Strain: Not on file  Food Insecurity: No Food Insecurity (11/23/2023)   Hunger Vital Sign    Worried About Running Out of Food in the Last Year: Never true    Ran Out of Food in the Last Year: Never true  Transportation Needs: No Transportation Needs (11/23/2023)   PRAPARE - Administrator, Civil Service (Medical): No    Lack of Transportation (Non-Medical): No  Physical Activity: Not on file  Stress: Not on file  Social Connections: Moderately Isolated (07/31/2023)   Social Connection and Isolation Panel    Frequency of Communication with Friends and Family: Twice a week    Frequency of Social Gatherings with Friends and Family:  Twice a week    Attends Religious Services: 1 to 4 times per year    Active Member of [employer] or Organizations: No    Attends Banker Meetings: Never    Marital Status: Never married  Intimate Partner Violence: Not At Risk (11/23/2023)   Humiliation, Afraid, Rape, and Kick questionnaire    Fear of Current or Ex-Partner: No    Emotionally Abused: No    Physically Abused: No    Sexually Abused: No    FAMILY HISTORY: Family History  Problem Relation Age of Onset   Diabetes Mother    Hypertension Mother    Stroke Mother    Healthy Sister    Hypertension Brother    Asthma Brother    Hypertension Brother    Healthy Son    Healthy Daughter    Healthy Daughter     ALLERGIES:  is allergic to trulicity  [dulaglutide ].  MEDICATIONS:  Current Outpatient Medications  Medication Sig Dispense Refill   acetaminophen  (TYLENOL ) 500 MG tablet Take 1,000 mg by mouth every 6 (six) hours as needed for mild pain (pain score 1-3) or moderate pain (pain score 4-6).  albuterol  (VENTOLIN  HFA) 108 (90 Base) MCG/ACT inhaler Inhale 2 puffs into the lungs every 6 (six) hours as needed for wheezing or shortness of breath. 18 g 2   amitriptyline  (ELAVIL ) 10 MG tablet Take 10 mg by mouth at bedtime.     amLODipine  (NORVASC ) 5 MG tablet Take 1 tablet (5 mg total) by mouth daily. 30 tablet 0   Continuous Glucose Sensor (DEXCOM G7 SENSOR) MISC 1 Device by Does not apply route as directed. Change sensor every 10 days 9 each 3   Cyanocobalamin  (VITAMIN B-12) 2500 MCG SUBL Place 1 tablet (2,500 mcg total) under the tongue daily. 30 tablet 2   ferrous sulfate  325 (65 FE) MG tablet Take 1 tablet (325 mg total) by mouth daily with breakfast. 30 tablet 0   fluticasone  (FLONASE ) 50 MCG/ACT nasal spray Place 2 sprays into both nostrils daily. 16 g 0   furosemide  (LASIX ) 20 MG tablet TAKE 1 TABLET(20 MG) BY MOUTH IN THE MORNING 90 tablet 2   gabapentin  (NEURONTIN ) 800 MG tablet Take 800 mg by mouth 3 (three)  times daily.     Galcanezumab -gnlm (EMGALITY ) 120 MG/ML SOAJ Inject 120 mg into the skin every 30 (thirty) days. 1.12 mL 11   Insulin  Disposable Pump (OMNIPOD 5 G7 PODS, GEN 5,) MISC 1 Device by Does not apply route every other day. 45 each 3   insulin  glargine (LANTUS  SOLOSTAR) 100 UNIT/ML Solostar Pen Inject 50 Units into the skin daily before breakfast. 45 mL 4   insulin  lispro (HUMALOG  KWIKPEN) 100 UNIT/ML KwikPen Inject 12-22 Units into the skin 3 (three) times daily. Max daily 60 mL 4   Insulin  Pen Needle 32G X 4 MM MISC 1 Device by Does not apply route in the morning, at noon, in the evening, and at bedtime. 400 each 3   metoprolol  tartrate (LOPRESSOR ) 25 MG tablet Take 1 tablet (25 mg total) by mouth 2 (two) times daily. 60 tablet 0   mometasone -formoterol  (DULERA ) 100-5 MCG/ACT AERO Inhale 2 puffs into the lungs 2 (two) times daily at 10 AM and 5 PM. 13 g 0   Multiple Vitamin (MULTIVITAMIN WITH MINERALS) TABS tablet Take 1 tablet by mouth daily.     oxyCODONE -acetaminophen  (PERCOCET) 7.5-325 MG tablet Take 1 tablet by mouth 4 (four) times daily as needed for moderate pain (pain score 4-6).     pantoprazole  (PROTONIX ) 40 MG tablet Take 1 tablet (40 mg total) by mouth daily. 30 tablet 0   Rimegepant Sulfate (NURTEC) 75 MG TBDP Take 1 tablet (75 mg total) by mouth as needed. Take 1 tablet at onset of headache, max is 1 tablet in 24 hours. 8 tablet 11   tiZANidine  (ZANAFLEX ) 4 MG tablet Take 4 mg by mouth every 12 (twelve) hours as needed for muscle spasms.     vitamin C  (ASCORBIC ACID ) 250 MG tablet Take 2 tablets (500 mg total) by mouth daily. 180 tablet 0   No current facility-administered medications for this visit.     PHYSICAL EXAMINATION: ECOG PERFORMANCE STATUS: 2 - Symptomatic, <50% confined to bed  Vitals:   01/24/24 1442  BP: (!) 120/59  Pulse: 85  Resp: 18  Temp: 98.4 F (36.9 C)  SpO2: 100%   Filed Weights   01/24/24 1442  Weight: 224 lb 4.8 oz (101.7 kg)     GENERAL:alert, no distress and comfortable, obese. NECK: supple, thyroid  normal size, non-tender, without nodularity LYMPH:  no palpable lymphadenopathy in the cervical, axillary  LUNGS: clear to auscultation  and percussion with normal breathing effort HEART: regular rate & rhythm and no murmurs and no lower extremity edema Musculoskeletal:no cyanosis of digits and no clubbing  PSYCH: alert & oriented x 3 with fluent speech NEURO: no focal motor/sensory deficits  LABORATORY DATA:  I have reviewed the data as listed Lab Results  Component Value Date   WBC 8.0 12/24/2023   HGB 10.0 (L) 12/24/2023   HCT 30.7 (L) 12/24/2023   HCT 28.1 (L) 12/24/2023   MCV 77.6 (L) 12/24/2023   PLT 183 12/24/2023     Chemistry      Component Value Date/Time   NA 133 (L) 12/24/2023 1538   NA 137 07/23/2023 1200   K 6.1 (H) 12/24/2023 1538   CL 105 12/24/2023 1538   CO2 18 (L) 12/24/2023 1538   BUN 21 (H) 12/24/2023 1538   BUN 23 07/23/2023 1200   CREATININE 1.09 (H) 12/24/2023 1538      Component Value Date/Time   CALCIUM  9.0 12/24/2023 1538   ALKPHOS 153 (H) 12/24/2023 1538   AST 15 12/24/2023 1538   ALT 9 12/24/2023 1538   BILITOT 0.9 12/24/2023 1538       RADIOGRAPHIC STUDIES: I have personally reviewed the radiological images as listed and agreed with the findings in the report. No results found.   All questions were answered. The patient knows to call the clinic with any problems, questions or concerns. I spent 30 minutes in the care of this patient including H and P, review of records, counseling and coordination of care.     Amber Stalls, MD 01/24/2024 2:55 PM

## 2024-01-24 NOTE — Progress Notes (Signed)
 [facility] CONSULT NOTE  Patient Care Team: [name], NP as PCP - General (Nurse Practitioner) Michele Richardson, DO as PCP - Cardiology (Cardiology) Babara Call, MD as Consulting Physician (Oncology)  CHIEF COMPLAINTS/PURPOSE OF CONSULTATION:  Hemolytic anemia  ASSESSMENT & PLAN:  No problem-specific Assessment & Plan notes found for this encounter.  No orders of the defined types were placed in this encounter.  This is a 43 year old female patient who was previously seen by Dr.Yu for the past several years and has been referred to us  for second opinion given ongoing hemolytic anemia  Assessment and Plan Assessment & Plan Chronic hemolytic anemia, she has elevated retic count, normal bili, low haptoglobin Chronic hemolytic anemia with mild hemolysis, requiring intermittent transfusions. Negative Coombs, PNH, and autoimmune workup suggest non-classic etiology.   CBC from today shows significant drop in Hb, mild increase in retics - We can consider alpha thal testing, G6PD deficiency and other enzyme deficiency such as pyruvate kinase deficiency or IG A mediated human autoimmune hemolytic anemia as possible differential diagnosis. - We also discussed about rechallenging steroids.  Iron  deficiency anemia due to menorrhagia Iron  deficiency anemia likely secondary to menorrhagia.  I encouraged her to continue oral iron . - This drop could also be from heavy menstruation, will follow up on the ferritin  Chronic kidney disease Chronic kidney disease possibly contributing to anemia. Uncontrolled diabetes and hypertension may be factors. Will follow up on CMP today  Type 2 diabetes mellitus, uncontrolled Uncontrolled type 2 diabetes mellitus with recent hemoglobin A1c of 7.5%.     Weight HISTORY OF PRESENTING ILLNESS:  Melinda Stone 43 y.o. female is here because of hemolytic anemia  This is followed by hematology over the past several years patient has been  extensively evaluated for underlying hemolytic anemia. Very complex history, multiple hospitalization at times for blood loss anemia and some times for possible hemolysis. No evidence of nutritional deficiency She did have episodes of hemolysis in the past but not classic hemolysis and required blood transfusion, DAT neg, no cold agglutinin. She responded to steroids however she didn't tolerate them well, hence discontinued. Previous bone marrow biopsy showed Slightly hypercellular bone marrow with erythroid hyperplasia, No viral cytopathic changes or significant dyspoiesis. Suspect secondary process. Normal cytogenetics, normal myeloid NGS  MPN testing negative. CT chest abdomen pelvis with no lymphoproliferative disease. She says she wanted a second opinion since she doesn't seem to know why she is anemic.  Interval History  Discussed the use of AI scribe software for clinical note transcription with the patient, who gave verbal consent to proceed.  History of Present Illness Melinda Stone is a 43 year old female with hemolytic anemia who presents for follow-up of her condition.  She feels less weak since her last visit, and her hemoglobin levels have improved, with the last recorded level being over ten. She continues to take iron  supplements. She experiences heavy menstruation, which may contribute to her anemia.  She experiences sinus infections, which she attributes to allergies, especially during the fall season.  She confirms that she is eating well and reports sleeping well. Rest of the pertinent 10 point ROS reviewed and neg  MEDICAL HISTORY:  Past Medical History:  Diagnosis Date   Anemia    Arthritis 08/17/2020   Chronic back pain    Diabetes mellitus without complication (HCC)    type 2   GERD (gastroesophageal reflux disease)    Hypertension    Migraine    Ovarian cyst  Pneumonia    Polyneuropathy     SURGICAL HISTORY: Past Surgical History:   Procedure Laterality Date   CATARACT EXTRACTION Right 11/08/2023   CESAREAN SECTION     x4   CHOLECYSTECTOMY  2004   EYE SURGERY Left    cataract   EYE SURGERY Left 10/2023   retinal surgery x2   HYSTEROSCOPY N/A 10/01/2023   Procedure: ABLATION, ENDOMETRIUM, HYSTEROSCOPIC;  Surgeon: Alger Gong, MD;  Location: [facility] OR;  Service: Gynecology;  Laterality: N/A;   IR BONE MARROW BIOPSY & ASPIRATION  08/01/2023   LUMBAR LAMINECTOMY N/A 05/30/2020   Procedure: Lumbar five Laminectomy,  Bilateral Microdiscectomy, Left Lumbar five -Sacral one Microdiscectomy;  Surgeon: Barbarann Oneil BROCKS, MD;  Location: [facility] OR;  Service: Orthopedics;  Laterality: N/A;   LUMBAR LAMINECTOMY Left 11/14/2020   Procedure: LEFT LUMBAR FOUR-FIVE MICRODISCECTOMY;  Surgeon: Barbarann Oneil BROCKS, MD;  Location: [facility] OR;  Service: Orthopedics;  Laterality: Left;   TUBAL LIGATION  10/19/2010    SOCIAL HISTORY: Social History   Socioeconomic History   Marital status: Single    Spouse name: Not on file   Number of children: 3   Years of education: Not on file   Highest education level: High school graduate  Occupational History    Comment: home maker  Tobacco Use   Smoking status: Every Day    Types: Cigars    Last attempt to quit: 12/23/2021    Years since quitting: 2.0    Passive exposure: Never   Smokeless tobacco: Never  Vaping Use   Vaping status: Never Used  Substance and Sexual Activity   Alcohol  use: Not Currently   Drug use: Not Currently    Comment: per patient stopped smoking marijuana Mar 2022   Sexual activity: Yes    Birth control/protection: Surgical  Other Topics Concern   Not on file  Social History Narrative   Lives with 3 children   Some coffee   Right handed   Doesn't work   Social Drivers of Corporate Investment Banker Strain: Not on file  Food Insecurity: No Food Insecurity (11/23/2023)   Hunger Vital Sign    Worried About Running Out of Food in the Last Year: Never true    Ran Out of  Food in the Last Year: Never true  Transportation Needs: No Transportation Needs (11/23/2023)   PRAPARE - Administrator, Civil Service (Medical): No    Lack of Transportation (Non-Medical): No  Physical Activity: Not on file  Stress: Not on file  Social Connections: Moderately Isolated (07/31/2023)   Social Connection and Isolation Panel    Frequency of Communication with Friends and Family: Twice a week    Frequency of Social Gatherings with Friends and Family: Twice a week    Attends Religious Services: 1 to 4 times per year    Active Member of [employer] or Organizations: No    Attends Banker Meetings: Never    Marital Status: Never married  Intimate Partner Violence: Not At Risk (11/23/2023)   Humiliation, Afraid, Rape, and Kick questionnaire    Fear of Current or Ex-Partner: No    Emotionally Abused: No    Physically Abused: No    Sexually Abused: No    FAMILY HISTORY: Family History  Problem Relation Age of Onset   Diabetes Mother    Hypertension Mother    Stroke Mother    Healthy Sister    Hypertension Brother    Asthma Brother  Hypertension Brother    Healthy Son    Healthy Daughter    Healthy Daughter     ALLERGIES:  is allergic to trulicity  [dulaglutide ].  MEDICATIONS:  Current Outpatient Medications  Medication Sig Dispense Refill   acetaminophen  (TYLENOL ) 500 MG tablet Take 1,000 mg by mouth every 6 (six) hours as needed for mild pain (pain score 1-3) or moderate pain (pain score 4-6).     albuterol  (VENTOLIN  HFA) 108 (90 Base) MCG/ACT inhaler Inhale 2 puffs into the lungs every 6 (six) hours as needed for wheezing or shortness of breath. 18 g 2   amitriptyline  (ELAVIL ) 10 MG tablet Take 10 mg by mouth at bedtime.     amLODipine  (NORVASC ) 5 MG tablet Take 1 tablet (5 mg total) by mouth daily. 30 tablet 0   Continuous Glucose Sensor (DEXCOM G7 SENSOR) MISC 1 Device by Does not apply route as directed. Change sensor every 10 days 9 each 3    Cyanocobalamin  (VITAMIN B-12) 2500 MCG SUBL Place 1 tablet (2,500 mcg total) under the tongue daily. 30 tablet 2   ferrous sulfate  325 (65 FE) MG tablet Take 1 tablet (325 mg total) by mouth daily with breakfast. 30 tablet 0   fluticasone  (FLONASE ) 50 MCG/ACT nasal spray Place 2 sprays into both nostrils daily. 16 g 0   furosemide  (LASIX ) 20 MG tablet TAKE 1 TABLET(20 MG) BY MOUTH IN THE MORNING 90 tablet 2   gabapentin  (NEURONTIN ) 800 MG tablet Take 800 mg by mouth 3 (three) times daily.     Galcanezumab -gnlm (EMGALITY ) 120 MG/ML SOAJ Inject 120 mg into the skin every 30 (thirty) days. 1.12 mL 11   Insulin  Disposable Pump (OMNIPOD 5 G7 PODS, GEN 5,) MISC 1 Device by Does not apply route every other day. 45 each 3   insulin  glargine (LANTUS  SOLOSTAR) 100 UNIT/ML Solostar Pen Inject 50 Units into the skin daily before breakfast. 45 mL 4   insulin  lispro (HUMALOG  KWIKPEN) 100 UNIT/ML KwikPen Inject 12-22 Units into the skin 3 (three) times daily. Max daily 60 mL 4   Insulin  Pen Needle 32G X 4 MM MISC 1 Device by Does not apply route in the morning, at noon, in the evening, and at bedtime. 400 each 3   metoprolol  tartrate (LOPRESSOR ) 25 MG tablet Take 1 tablet (25 mg total) by mouth 2 (two) times daily. 60 tablet 0   mometasone -formoterol  (DULERA ) 100-5 MCG/ACT AERO Inhale 2 puffs into the lungs 2 (two) times daily at 10 AM and 5 PM. 13 g 0   Multiple Vitamin (MULTIVITAMIN WITH MINERALS) TABS tablet Take 1 tablet by mouth daily.     oxyCODONE -acetaminophen  (PERCOCET) 7.5-325 MG tablet Take 1 tablet by mouth 4 (four) times daily as needed for moderate pain (pain score 4-6).     pantoprazole  (PROTONIX ) 40 MG tablet Take 1 tablet (40 mg total) by mouth daily. 30 tablet 0   Rimegepant Sulfate (NURTEC) 75 MG TBDP Take 1 tablet (75 mg total) by mouth as needed. Take 1 tablet at onset of headache, max is 1 tablet in 24 hours. 8 tablet 11   tiZANidine  (ZANAFLEX ) 4 MG tablet Take 4 mg by mouth every 12 (twelve)  hours as needed for muscle spasms.     vitamin C  (ASCORBIC ACID ) 250 MG tablet Take 2 tablets (500 mg total) by mouth daily. 180 tablet 0   No current facility-administered medications for this visit.     PHYSICAL EXAMINATION: ECOG PERFORMANCE STATUS: 2 - Symptomatic, <50% confined to  bed  Vitals:   01/24/24 1442  BP: (!) 120/59  Pulse: 85  Resp: 18  Temp: 98.4 F (36.9 C)  SpO2: 100%   Filed Weights   01/24/24 1442  Weight: 224 lb 4.8 oz (101.7 kg)    GENERAL:alert, no distress and comfortable, obese. LYMPH:  no palpable lymphadenopathy in the cervical, axillary  LUNGS: clear to auscultation and percussion with normal breathing effort HEART: regular rate & rhythm and no murmurs and no lower extremity edema Musculoskeletal:no cyanosis of digits and no clubbing  PSYCH: alert & oriented x 3 with fluent speech NEURO: no focal motor/sensory deficits  LABORATORY DATA:  I have reviewed the data as listed Lab Results  Component Value Date   WBC 8.0 12/24/2023   HGB 10.0 (L) 12/24/2023   HCT 30.7 (L) 12/24/2023   HCT 28.1 (L) 12/24/2023   MCV 77.6 (L) 12/24/2023   PLT 183 12/24/2023     Chemistry      Component Value Date/Time   NA 133 (L) 12/24/2023 1538   NA 137 07/23/2023 1200   K 6.1 (H) 12/24/2023 1538   CL 105 12/24/2023 1538   CO2 18 (L) 12/24/2023 1538   BUN 21 (H) 12/24/2023 1538   BUN 23 07/23/2023 1200   CREATININE 1.09 (H) 12/24/2023 1538      Component Value Date/Time   CALCIUM  9.0 12/24/2023 1538   ALKPHOS 153 (H) 12/24/2023 1538   AST 15 12/24/2023 1538   ALT 9 12/24/2023 1538   BILITOT 0.9 12/24/2023 1538       RADIOGRAPHIC STUDIES: I have personally reviewed the radiological images as listed and agreed with the findings in the report. No results found.   All questions were answered. The patient knows to call the clinic with any problems, questions or concerns. I spent 30 minutes in the care of this patient including H and P, review of  records, counseling and coordination of care.     Amber Stalls, MD 01/24/2024 2:52 PM

## 2024-02-04 ENCOUNTER — Encounter: Attending: Internal Medicine | Admitting: Nutrition

## 2024-02-04 DIAGNOSIS — E1142 Type 2 diabetes mellitus with diabetic polyneuropathy: Secondary | ICD-10-CM | POA: Insufficient documentation

## 2024-02-04 DIAGNOSIS — E1165 Type 2 diabetes mellitus with hyperglycemia: Secondary | ICD-10-CM | POA: Diagnosis not present

## 2024-02-04 NOTE — Progress Notes (Signed)
 Patient reported having gone on her pump without assistance or orders from Dr. Sam, and blood sugars dropped low.  Discussed how this pump delivers insulin , and the need to put in the correct basal rates as well as amounts of insulin  to cover meals.  She is willing to try this again.  Appointment scheduled for next week.  Disccused the need to stop her Lantus  and take none Monday morning, bring her PDM and pods as well as her Humalog , phone, and user name and password to her OmniPod account set up by her daughter.  She reported good understanding of this. She is currently wearing a dexcom G7 going to her phone.  Diet: typical day: 6AM: awake and up 8AM:  Bfast:  Lantus  40u, and Humalog                       1Cup grits, 2eggs and valilla Frapacino  81 total carbs 11-3 back to sleep 3PM: can of campbells bef veg. Soup  30 carbs  water  or diet gingerale 9:30PM: supper:  boiled chicken or baked,green beans or broccoli, rice        water  to drink or diet gingerale  45 carbs Denies eating between meals or during the night.  Says appetite is gone.  Nausea often Believe patient is good candidate for pump, and encouraged her to bring her daughter, but not able due to school.

## 2024-02-04 NOTE — Patient Instructions (Signed)
 Take no lantus  Monday morning Charge PDM, write down user name and password, bring starter box and Humalog  insulin  on Monday at 9:30.

## 2024-02-10 ENCOUNTER — Encounter: Admitting: Nutrition

## 2024-02-10 DIAGNOSIS — H40843 Neovascular secondary angle closure glaucoma, bilateral: Secondary | ICD-10-CM | POA: Insufficient documentation

## 2024-02-11 ENCOUNTER — Telehealth: Payer: Self-pay | Admitting: Nutrition

## 2024-02-11 NOTE — Telephone Encounter (Signed)
 Patient reports no difficulty giving boluses.  Says blood sugar dropped low at 4AM to 57.  She treated this and went back to sleep.  Does not remember what it was on waking.  Ate lunch at 11, and blood sugar is now 152.  She had no questions for me at this time. She was told that if the blood sugar drops low again tonight, to call the office in the morning, and tell Dr. Sam.  She agreed to do this.

## 2024-02-11 NOTE — Progress Notes (Signed)
 Patient was trained on the use of the OmniPod 5 insulin  pump.  We discussed how this pump delivers insulin , and the difference between basal and bolus insulin  doses,  Settings were put into the pump per Dr. Kris orders:  Basal insulin  1.5u/hr, Max basal: 3.0u/hr, ISF: 25, I/C: 1 with written instructions to give 6g at each meal and 3 grams for snacks. She was shown how to fill a pod, how to bolus and how/when to do correction doses.  She filled a pod and attached it to her right upper abdomen.  We discussed proper placement of the pod with reference to her sensor site.  We linked her dexcom G7 to her pod and pdm.  Her Dexcom app is linked to [facility], and to her PDM and app on her phone for her next pod placement.  She is linked to [facility] and [facility] with UN:  mcontraller  password:  [email or web address]    Her  blood sugar was 322mg /dl.  She ate breakfast but took no insulin , neither long or short acting.  She did a correction dose at 10:15AM.   We discussed all topics on the pump training checklist and she reported good understanding of each topic and signed this indication good understanding.  She had no final questions

## 2024-02-11 NOTE — Patient Instructions (Signed)
 Change pod every 3 days or whenever empty. Give a bolus for all meals and snacks unless treating a low blood sugar Do a correction dose whenever blood sugars are over 250 mg/dl. Call OmniPod if questions about how to use the pump Call Dexcom help line if questions/problems with the sensors Change/start new Dexcom on both the dexcom app and PDM, or OmniPod app.

## 2024-02-18 NOTE — Telephone Encounter (Signed)
 error

## 2024-02-19 ENCOUNTER — Telehealth: Payer: Self-pay | Admitting: Neurology

## 2024-02-19 ENCOUNTER — Telehealth: Admitting: Neurology

## 2024-02-19 NOTE — Progress Notes (Deleted)
 Patient: Melinda Stone Ply Date of Birth: 1980-04-28  Reason for Visit: Follow up History from: Patient Primary Neurologist: [facility]   Virtual Visit via Video Note  I connected with Azjah L Dornfeld on 02/19/24 at  3:45 PM EST by a video enabled telemedicine application and verified that I am speaking with the correct person using two identifiers.  Location: Patient: *** Provider: ***   I discussed the limitations of evaluation and management by telemedicine and the availability of in person appointments. The patient expressed understanding and agreed to proceed.  ASSESSMENT AND PLAN 43 y.o. year old female   1.  Chronic migraine headache with aura  2.  Chronic pain syndrome  - Restart Emgality  120 mg monthly injection for migraine prevention - Try Nurtec 75 mg as needed for acute migraine, Take 1 tablet at onset of headache, max is 1 tablet in 24 hours.  - Urine pregnancy test was negative yesterday, had tubes tied, but still has menstrual cycle, pregnancy not advised until all CGRP for 6 months -Previously tried and failed: Amitriptyline , Topamax , Maxalt , gabapentin , Percocet, tizanidine , Imitrex , Nurtec -Next steps: Ajovy, Qulipta, Botox -For now, we will continue amitriptyline , it does help with her sleeping, would like to consider weaning off once established on CGRP -Follow-up in 6 months or sooner if needed  No orders of the defined types were placed in this encounter.  HISTORY OF PRESENT ILLNESS: Today 02/19/24 02/19/24 SS:   08/20/23 SS: Struggling with anemia, has been getting blood infusions/hospital admission, last HCG 8.0 08/08/23. She did the loading dose of Emgality , hard to say if difference, because had headaches when she is anemic. Hasn't had Emgality  since Feb, told on back order. Headaches get better with blood infusions. The Ubrelvy  made her nauseated, she went to sleep. Hard to say headache frequency, maybe 3 times a week moderate-severe  headache, left sided neck area, temples, makes her nauseated. Remains on amitriptyline  25 mg at bedtime. Takes Percocet daily for back pain. Had negative urine pregnancy test yesterday.   Update 01/31/23 SS: Never got Emgality  reports due to being out of stock. Migraines 3-4 a week. Tried Imitrex , was helpful but her vomit. Remains on amitritpyline 25 mg at bedtime, helps her sleep, unclear benefit to migraines?  Continues to see pain management for low back pain determine if any surgical options are available.  07/11/22 SS: Had MRI of the brain with and without contrast October 2023 was unremarkable. Amitriptyline  started last visit 25 mg at bedtime. No change in headaches, but it helps her sleep. Still having 2-3 a week. Are frontal, occipital. Migraine features. Takes excedrin  migraine and tylenol , doesn't help. Tried Maxalt  in the past without benefit. Chronic back pain on oxycodone . Hospitalized for PNA in Feb 2024. Supposed to have fusion in lower back, reports orthopedics won't operate due to low iron  levels, ESI caused more pain than help. Get a 2nd opinion, continue going to pain management. Trying for awhile to get disability, going through appeal.   HISTORY  UPDATE (06/20/21, VRP): Since last visit, now with migraine x 2 months. Similar HA in her 20's. Gen throbbing, nausea, sens to light and sound HA. Now 3-4 per week.   PRIOR HPI (11/07/20): 43 year old female with type 2 diabetes and chronic low back pain here for evaluation of lower extremity pain and numbness.   Patient has had diabetes since age 18 years old.  She has had lower extremity numbness and tingling for past 3 to 4 years.  She is also  been diagnosed with lumbar spinal stenosis and degenerative changes, status post surgery in March 2022.  Follow-up postoperative imaging demonstrates residual spinal stenosis and radiculopathy changes and she is planning to have additional surgery in the next few weeks.   Patient has been under care  of pain management in the past.  She has tried gabapentin , Lyrica , Cymbalta , Robaxin , hydrocodone  and tramadol  in the past  REVIEW OF SYSTEMS: Out of a complete 14 system review of symptoms, the patient complains only of the following symptoms, and all other reviewed systems are negative.  See HPI  ALLERGIES: Allergies  Allergen Reactions   Trulicity  [Dulaglutide ] Diarrhea    HOME MEDICATIONS: Outpatient Medications Prior to Visit  Medication Sig Dispense Refill   acetaminophen  (TYLENOL ) 500 MG tablet Take 1,000 mg by mouth every 6 (six) hours as needed for mild pain (pain score 1-3) or moderate pain (pain score 4-6).     albuterol  (VENTOLIN  HFA) 108 (90 Base) MCG/ACT inhaler Inhale 2 puffs into the lungs every 6 (six) hours as needed for wheezing or shortness of breath. 18 g 2   amitriptyline  (ELAVIL ) 10 MG tablet Take 10 mg by mouth at bedtime.     amLODipine  (NORVASC ) 5 MG tablet Take 1 tablet (5 mg total) by mouth daily. 30 tablet 0   Continuous Glucose Sensor (DEXCOM G7 SENSOR) MISC 1 Device by Does not apply route as directed. Change sensor every 10 days 9 each 3   Cyanocobalamin  (VITAMIN B-12) 2500 MCG SUBL Place 1 tablet (2,500 mcg total) under the tongue daily. 30 tablet 2   ferrous sulfate  325 (65 FE) MG tablet Take 1 tablet (325 mg total) by mouth daily with breakfast. 30 tablet 0   fluticasone  (FLONASE ) 50 MCG/ACT nasal spray Place 2 sprays into both nostrils daily. 16 g 0   furosemide  (LASIX ) 20 MG tablet TAKE 1 TABLET(20 MG) BY MOUTH IN THE MORNING 90 tablet 2   gabapentin  (NEURONTIN ) 800 MG tablet Take 800 mg by mouth 3 (three) times daily.     Galcanezumab -gnlm (EMGALITY ) 120 MG/ML SOAJ Inject 120 mg into the skin every 30 (thirty) days. 1.12 mL 11   Insulin  Disposable Pump (OMNIPOD 5 G7 PODS, GEN 5,) MISC 1 Device by Does not apply route every other day. 45 each 3   insulin  glargine (LANTUS  SOLOSTAR) 100 UNIT/ML Solostar Pen Inject 50 Units into the skin daily before  breakfast. 45 mL 4   insulin  lispro (HUMALOG  KWIKPEN) 100 UNIT/ML KwikPen Inject 12-22 Units into the skin 3 (three) times daily. Max daily 60 mL 4   Insulin  Pen Needle 32G X 4 MM MISC 1 Device by Does not apply route in the morning, at noon, in the evening, and at bedtime. 400 each 3   metoprolol  tartrate (LOPRESSOR ) 25 MG tablet Take 1 tablet (25 mg total) by mouth 2 (two) times daily. 60 tablet 0   mometasone -formoterol  (DULERA ) 100-5 MCG/ACT AERO Inhale 2 puffs into the lungs 2 (two) times daily at 10 AM and 5 PM. 13 g 0   Multiple Vitamin (MULTIVITAMIN WITH MINERALS) TABS tablet Take 1 tablet by mouth daily.     oxyCODONE -acetaminophen  (PERCOCET) 7.5-325 MG tablet Take 1 tablet by mouth 4 (four) times daily as needed for moderate pain (pain score 4-6).     pantoprazole  (PROTONIX ) 40 MG tablet Take 1 tablet (40 mg total) by mouth daily. 30 tablet 0   Rimegepant Sulfate (NURTEC) 75 MG TBDP Take 1 tablet (75 mg total) by mouth as needed. Take  1 tablet at onset of headache, max is 1 tablet in 24 hours. 8 tablet 11   tiZANidine  (ZANAFLEX ) 4 MG tablet Take 4 mg by mouth every 12 (twelve) hours as needed for muscle spasms.     vitamin C  (ASCORBIC ACID ) 250 MG tablet Take 2 tablets (500 mg total) by mouth daily. 180 tablet 0   No facility-administered medications prior to visit.    PAST MEDICAL HISTORY: Past Medical History:  Diagnosis Date   Anemia    Arthritis 08/17/2020   Chronic back pain    Diabetes mellitus without complication (HCC)    type 2   GERD (gastroesophageal reflux disease)    Hypertension    Migraine    Ovarian cyst    Pneumonia    Polyneuropathy     PAST SURGICAL HISTORY: Past Surgical History:  Procedure Laterality Date   CATARACT EXTRACTION Right 11/08/2023   CESAREAN SECTION     x4   CHOLECYSTECTOMY  2004   EYE SURGERY Left    cataract   EYE SURGERY Left 10/2023   retinal surgery x2   HYSTEROSCOPY N/A 10/01/2023   Procedure: ABLATION, ENDOMETRIUM,  HYSTEROSCOPIC;  Surgeon: Alger Gong, MD;  Location: [facility] OR;  Service: Gynecology;  Laterality: N/A;   IR BONE MARROW BIOPSY & ASPIRATION  08/01/2023   LUMBAR LAMINECTOMY N/A 05/30/2020   Procedure: Lumbar five Laminectomy,  Bilateral Microdiscectomy, Left Lumbar five -Sacral one Microdiscectomy;  Surgeon: Barbarann Oneil BROCKS, MD;  Location: [facility] OR;  Service: Orthopedics;  Laterality: N/A;   LUMBAR LAMINECTOMY Left 11/14/2020   Procedure: LEFT LUMBAR FOUR-FIVE MICRODISCECTOMY;  Surgeon: Barbarann Oneil BROCKS, MD;  Location: [facility] OR;  Service: Orthopedics;  Laterality: Left;   TUBAL LIGATION  10/19/2010    FAMILY HISTORY: Family History  Problem Relation Age of Onset   Diabetes Mother    Hypertension Mother    Stroke Mother    Healthy Sister    Hypertension Brother    Asthma Brother    Hypertension Brother    Healthy Son    Healthy Daughter    Healthy Daughter     SOCIAL HISTORY: Social History   Socioeconomic History   Marital status: Single    Spouse name: Not on file   Number of children: 3   Years of education: Not on file   Highest education level: High school graduate  Occupational History    Comment: home maker  Tobacco Use   Smoking status: Every Day    Types: Cigars    Last attempt to quit: 12/23/2021    Years since quitting: 2.1    Passive exposure: Never   Smokeless tobacco: Never  Vaping Use   Vaping status: Never Used  Substance and Sexual Activity   Alcohol  use: Not Currently   Drug use: Not Currently    Comment: per patient stopped smoking marijuana Mar 2022   Sexual activity: Yes    Birth control/protection: Surgical  Other Topics Concern   Not on file  Social History Narrative   Lives with 3 children   Some coffee   Right handed   Doesn't work   Social Drivers of Corporate Investment Banker Strain: Not on file  Food Insecurity: No Food Insecurity (11/23/2023)   Hunger Vital Sign    Worried About Running Out of Food in the Last Year: Never true    Ran Out  of Food in the Last Year: Never true  Transportation Needs: No Transportation Needs (11/23/2023)   PRAPARE - Transportation  Lack of Transportation (Medical): No    Lack of Transportation (Non-Medical): No  Physical Activity: Not on file  Stress: Not on file  Social Connections: Moderately Isolated (07/31/2023)   Social Connection and Isolation Panel    Frequency of Communication with Friends and Family: Twice a week    Frequency of Social Gatherings with Friends and Family: Twice a week    Attends Religious Services: 1 to 4 times per year    Active Member of [employer] or Organizations: No    Attends Banker Meetings: Never    Marital Status: Never married  Intimate Partner Violence: Not At Risk (11/23/2023)   Humiliation, Afraid, Rape, and Kick questionnaire    Fear of Current or Ex-Partner: No    Emotionally Abused: No    Physically Abused: No    Sexually Abused: No   PHYSICAL EXAM  There were no vitals filed for this visit.  There is no height or weight on file to calculate BMI.  Generalized: Well developed, in no acute distress, disheveled, tired  Neurological examination  Mentation: Alert oriented to time, place, history taking. Follows all commands speech and language fluent Cranial nerve II-XII: Pupils were equal round reactive to light. Extraocular movements were full, visual field were full on confrontational test. Facial sensation and strength were normal.  Head turning and shoulder shrug  were normal and symmetric. Motor: The motor testing reveals 5 over 5 strength of all extremities, exception 4/5 left hip flexion Sensory: Sensory testing is intact to soft touch on all 4 extremities. No evidence of extinction is noted.  Coordination: Cerebellar testing reveals good finger-nose-finger and heel-to-shin bilaterally.  Gait and station: Independent but slow, antalgic   DIAGNOSTIC DATA (LABS, IMAGING, TESTING) - I reviewed patient records, labs, notes, testing and  imaging myself where available.  Lab Results  Component Value Date   WBC 7.5 01/24/2024   HGB 8.6 (L) 01/24/2024   HCT 24.7 (L) 01/24/2024   MCV 81.8 01/24/2024   PLT 171 01/24/2024      Component Value Date/Time   NA 134 (L) 01/24/2024 1551   NA 137 07/23/2023 1200   K 5.7 (H) 01/24/2024 1551   CL 112 (H) 01/24/2024 1551   CO2 18 (L) 01/24/2024 1551   GLUCOSE 266 (H) 01/24/2024 1551   BUN 15 01/24/2024 1551   BUN 23 07/23/2023 1200   CREATININE 0.98 01/24/2024 1551   CALCIUM  8.6 (L) 01/24/2024 1551   PROT 6.1 (L) 01/24/2024 1551   PROT 6.3 04/28/2020 1124   ALBUMIN 3.7 01/24/2024 1551   ALBUMIN 4.0 04/28/2020 1124   AST <10 (L) 01/24/2024 1551   ALT 5 01/24/2024 1551   ALKPHOS 120 01/24/2024 1551   BILITOT 0.8 01/24/2024 1551   GFRNONAA >60 01/24/2024 1551   GFRAA 139 04/28/2020 1124   Lab Results  Component Value Date   CHOL 96 (L) 04/28/2020   HDL 55 04/28/2020   LDLCALC 23 04/28/2020   TRIG 138 05/16/2023   CHOLHDL 1.7 04/28/2020   Lab Results  Component Value Date   HGBA1C 7.8 (A) 12/27/2023   Lab Results  Component Value Date   VITAMINB12 646 12/24/2023   Lab Results  Component Value Date   TSH 0.655 05/11/2023    Lauraine Born, AGNP-C, DNP 02/19/2024, 5:31 AM [facility] [address], [address] [place], [place] [address] [phone]

## 2024-02-19 NOTE — Telephone Encounter (Signed)
 I called the patient, could not do VV. Would like to reschedule for office visit.

## 2024-02-20 ENCOUNTER — Ambulatory Visit: Admitting: Neurology

## 2024-02-20 NOTE — Telephone Encounter (Signed)
 CALLED AND SCHEDULED APPT FOR TODAY

## 2024-02-26 ENCOUNTER — Telehealth: Payer: Self-pay

## 2024-02-26 NOTE — Telephone Encounter (Signed)
 Spoke with patient and confirmed appointment on 12/11

## 2024-02-27 ENCOUNTER — Inpatient Hospital Stay: Attending: Oncology

## 2024-02-27 ENCOUNTER — Inpatient Hospital Stay

## 2024-02-27 ENCOUNTER — Inpatient Hospital Stay (HOSPITAL_COMMUNITY)
Admission: EM | Admit: 2024-02-27 | Discharge: 2024-02-29 | DRG: 641 | Disposition: A | Source: Ambulatory Visit | Attending: Internal Medicine | Admitting: Internal Medicine

## 2024-02-27 ENCOUNTER — Other Ambulatory Visit: Payer: Self-pay

## 2024-02-27 ENCOUNTER — Encounter (HOSPITAL_COMMUNITY): Payer: Self-pay

## 2024-02-27 ENCOUNTER — Inpatient Hospital Stay (HOSPITAL_BASED_OUTPATIENT_CLINIC_OR_DEPARTMENT_OTHER): Admitting: Hematology and Oncology

## 2024-02-27 ENCOUNTER — Telehealth: Payer: Self-pay | Admitting: *Deleted

## 2024-02-27 ENCOUNTER — Other Ambulatory Visit: Payer: Self-pay | Admitting: *Deleted

## 2024-02-27 VITALS — BP 108/53 | HR 81 | Temp 97.8°F | Resp 17 | Wt 218.9 lb

## 2024-02-27 DIAGNOSIS — N920 Excessive and frequent menstruation with regular cycle: Secondary | ICD-10-CM | POA: Diagnosis not present

## 2024-02-27 DIAGNOSIS — N189 Chronic kidney disease, unspecified: Secondary | ICD-10-CM | POA: Diagnosis not present

## 2024-02-27 DIAGNOSIS — E872 Acidosis, unspecified: Secondary | ICD-10-CM | POA: Diagnosis present

## 2024-02-27 DIAGNOSIS — N2589 Other disorders resulting from impaired renal tubular function: Secondary | ICD-10-CM | POA: Diagnosis present

## 2024-02-27 DIAGNOSIS — E785 Hyperlipidemia, unspecified: Secondary | ICD-10-CM | POA: Diagnosis present

## 2024-02-27 DIAGNOSIS — D509 Iron deficiency anemia, unspecified: Secondary | ICD-10-CM | POA: Diagnosis present

## 2024-02-27 DIAGNOSIS — Z794 Long term (current) use of insulin: Secondary | ICD-10-CM | POA: Diagnosis not present

## 2024-02-27 DIAGNOSIS — D649 Anemia, unspecified: Secondary | ICD-10-CM | POA: Diagnosis not present

## 2024-02-27 DIAGNOSIS — F17290 Nicotine dependence, other tobacco product, uncomplicated: Secondary | ICD-10-CM | POA: Insufficient documentation

## 2024-02-27 DIAGNOSIS — Z825 Family history of asthma and other chronic lower respiratory diseases: Secondary | ICD-10-CM | POA: Diagnosis not present

## 2024-02-27 DIAGNOSIS — Z823 Family history of stroke: Secondary | ICD-10-CM | POA: Diagnosis not present

## 2024-02-27 DIAGNOSIS — E119 Type 2 diabetes mellitus without complications: Secondary | ICD-10-CM | POA: Diagnosis not present

## 2024-02-27 DIAGNOSIS — Z1152 Encounter for screening for COVID-19: Secondary | ICD-10-CM | POA: Diagnosis not present

## 2024-02-27 DIAGNOSIS — I129 Hypertensive chronic kidney disease with stage 1 through stage 4 chronic kidney disease, or unspecified chronic kidney disease: Secondary | ICD-10-CM | POA: Diagnosis present

## 2024-02-27 DIAGNOSIS — E875 Hyperkalemia: Principal | ICD-10-CM | POA: Diagnosis present

## 2024-02-27 DIAGNOSIS — N922 Excessive menstruation at puberty: Secondary | ICD-10-CM | POA: Diagnosis not present

## 2024-02-27 DIAGNOSIS — E1165 Type 2 diabetes mellitus with hyperglycemia: Secondary | ICD-10-CM | POA: Diagnosis not present

## 2024-02-27 DIAGNOSIS — E11649 Type 2 diabetes mellitus with hypoglycemia without coma: Secondary | ICD-10-CM

## 2024-02-27 DIAGNOSIS — Z9641 Presence of insulin pump (external) (internal): Secondary | ICD-10-CM | POA: Diagnosis present

## 2024-02-27 DIAGNOSIS — D589 Hereditary hemolytic anemia, unspecified: Secondary | ICD-10-CM | POA: Insufficient documentation

## 2024-02-27 DIAGNOSIS — Z833 Family history of diabetes mellitus: Secondary | ICD-10-CM | POA: Diagnosis not present

## 2024-02-27 DIAGNOSIS — Z8249 Family history of ischemic heart disease and other diseases of the circulatory system: Secondary | ICD-10-CM | POA: Diagnosis not present

## 2024-02-27 DIAGNOSIS — N184 Chronic kidney disease, stage 4 (severe): Secondary | ICD-10-CM | POA: Diagnosis present

## 2024-02-27 DIAGNOSIS — G8929 Other chronic pain: Secondary | ICD-10-CM | POA: Diagnosis present

## 2024-02-27 DIAGNOSIS — E1142 Type 2 diabetes mellitus with diabetic polyneuropathy: Secondary | ICD-10-CM | POA: Diagnosis present

## 2024-02-27 DIAGNOSIS — Z7951 Long term (current) use of inhaled steroids: Secondary | ICD-10-CM | POA: Diagnosis not present

## 2024-02-27 DIAGNOSIS — D591 Autoimmune hemolytic anemia, unspecified: Secondary | ICD-10-CM | POA: Diagnosis not present

## 2024-02-27 DIAGNOSIS — E1122 Type 2 diabetes mellitus with diabetic chronic kidney disease: Secondary | ICD-10-CM | POA: Diagnosis present

## 2024-02-27 DIAGNOSIS — Z23 Encounter for immunization: Secondary | ICD-10-CM | POA: Diagnosis present

## 2024-02-27 DIAGNOSIS — I10 Essential (primary) hypertension: Secondary | ICD-10-CM | POA: Diagnosis not present

## 2024-02-27 DIAGNOSIS — G43909 Migraine, unspecified, not intractable, without status migrainosus: Secondary | ICD-10-CM | POA: Diagnosis present

## 2024-02-27 DIAGNOSIS — F32A Depression, unspecified: Secondary | ICD-10-CM | POA: Diagnosis present

## 2024-02-27 LAB — COMPREHENSIVE METABOLIC PANEL WITH GFR
ALT: 8 U/L (ref 0–44)
AST: 14 U/L — ABNORMAL LOW (ref 15–41)
Albumin: 3.3 g/dL — ABNORMAL LOW (ref 3.5–5.0)
Alkaline Phosphatase: 88 U/L (ref 38–126)
Anion gap: 5 (ref 5–15)
BUN: 17 mg/dL (ref 6–20)
CO2: 16 mmol/L — ABNORMAL LOW (ref 22–32)
Calcium: 8.3 mg/dL — ABNORMAL LOW (ref 8.9–10.3)
Chloride: 115 mmol/L — ABNORMAL HIGH (ref 98–111)
Creatinine, Ser: 1.15 mg/dL — ABNORMAL HIGH (ref 0.44–1.00)
GFR, Estimated: >60 mL/min (ref >60)
Glucose, Bld: 226 mg/dL — ABNORMAL HIGH (ref 70–99)
Potassium: 6.4 mmol/L (ref 3.5–5.1)
Sodium: 136 mmol/L (ref 135–145)
Total Bilirubin: 0.9 mg/dL (ref 0.0–1.2)
Total Protein: 6.1 g/dL — ABNORMAL LOW (ref 6.5–8.1)

## 2024-02-27 LAB — CBC WITH DIFFERENTIAL/PLATELET
Abs Immature Granulocytes: 0.01 K/uL (ref 0.00–0.07)
Abs Immature Granulocytes: 0.02 K/uL (ref 0.00–0.07)
Basophils Absolute: 0 K/uL (ref 0.0–0.1)
Basophils Absolute: 0.1 K/uL (ref 0.0–0.1)
Basophils Relative: 1 %
Basophils Relative: 1 %
Eosinophils Absolute: 0.3 K/uL (ref 0.0–0.5)
Eosinophils Absolute: 0.3 K/uL (ref 0.0–0.5)
Eosinophils Relative: 4 %
Eosinophils Relative: 5 %
HCT: 21.6 % — ABNORMAL LOW (ref 36.0–46.0)
HCT: 22.4 % — ABNORMAL LOW (ref 36.0–46.0)
Hemoglobin: 7.2 g/dL — ABNORMAL LOW (ref 12.0–15.0)
Hemoglobin: 7.5 g/dL — ABNORMAL LOW (ref 12.0–15.0)
Immature Granulocytes: 0 %
Immature Granulocytes: 0 %
Lymphocytes Relative: 27 %
Lymphocytes Relative: 22 %
Lymphs Abs: 1.5 K/uL (ref 0.7–4.0)
Lymphs Abs: 1.3 K/uL (ref 0.7–4.0)
MCH: 28.1 pg (ref 26.0–34.0)
MCH: 28.4 pg (ref 26.0–34.0)
MCHC: 33.3 g/dL (ref 30.0–36.0)
MCHC: 33.5 g/dL (ref 30.0–36.0)
MCV: 84.4 fL (ref 80.0–100.0)
MCV: 84.8 fL (ref 80.0–100.0)
Monocytes Absolute: 0.3 K/uL (ref 0.1–1.0)
Monocytes Absolute: 0.3 K/uL (ref 0.1–1.0)
Monocytes Relative: 5 %
Monocytes Relative: 5 %
Neutro Abs: 3.6 K/uL (ref 1.7–7.7)
Neutro Abs: 3.9 K/uL (ref 1.7–7.7)
Neutrophils Relative %: 63 %
Neutrophils Relative %: 67 %
Platelets: 164 K/uL (ref 150–400)
Platelets: 170 K/uL (ref 150–400)
RBC: 2.56 MIL/uL — ABNORMAL LOW (ref 3.87–5.11)
RBC: 2.64 MIL/uL — ABNORMAL LOW (ref 3.87–5.11)
RDW: 20.8 % — ABNORMAL HIGH (ref 11.5–15.5)
RDW: 20.7 % — ABNORMAL HIGH (ref 11.5–15.5)
WBC: 5.8 K/uL (ref 4.0–10.5)
WBC: 5.9 K/uL (ref 4.0–10.5)
nRBC: 0 % (ref 0.0–0.2)
nRBC: 0 % (ref 0.0–0.2)

## 2024-02-27 LAB — CBG MONITORING, ED
Glucose-Capillary: 244 mg/dL — ABNORMAL HIGH (ref 70–99)
Glucose-Capillary: 251 mg/dL — ABNORMAL HIGH (ref 70–99)

## 2024-02-27 LAB — CMP (CANCER CENTER ONLY)
ALT: 6 U/L (ref 0–44)
AST: 13 U/L — ABNORMAL LOW (ref 15–41)
Albumin: 3.7 g/dL (ref 3.5–5.0)
Alkaline Phosphatase: 103 U/L (ref 38–126)
Anion gap: 7 (ref 5–15)
BUN: 16 mg/dL (ref 6–20)
CO2: 15 mmol/L — ABNORMAL LOW (ref 22–32)
Calcium: 8.4 mg/dL — ABNORMAL LOW (ref 8.9–10.3)
Chloride: 115 mmol/L — ABNORMAL HIGH (ref 98–111)
Creatinine: 1.22 mg/dL — ABNORMAL HIGH (ref 0.44–1.00)
GFR, Estimated: 57 mL/min — ABNORMAL LOW (ref >60)
Glucose, Bld: 195 mg/dL — ABNORMAL HIGH (ref 70–99)
Potassium: 6.6 mmol/L (ref 3.5–5.1)
Sodium: 137 mmol/L (ref 135–145)
Total Bilirubin: 0.6 mg/dL (ref 0.0–1.2)
Total Protein: 6.1 g/dL — ABNORMAL LOW (ref 6.5–8.1)

## 2024-02-27 LAB — IRON AND IRON BINDING CAPACITY (CC-WL,HP ONLY)
Iron: 76 ug/dL (ref 28–170)
Saturation Ratios: 26 % (ref 10.4–31.8)
TIBC: 290 ug/dL (ref 250–450)
UIBC: 214 ug/dL

## 2024-02-27 LAB — RETICULOCYTES
Immature Retic Fract: 16.3 % — ABNORMAL HIGH (ref 2.3–15.9)
RBC.: 2.76 MIL/uL — ABNORMAL LOW (ref 3.87–5.11)
Retic Count, Absolute: 407.7 K/uL — ABNORMAL HIGH (ref 19.0–186.0)
Retic Ct Pct: 14.8 % — ABNORMAL HIGH (ref 0.4–3.1)

## 2024-02-27 LAB — LACTATE DEHYDROGENASE: LDH: 196 U/L (ref 105–235)

## 2024-02-27 LAB — RESP PANEL BY RT-PCR (RSV, FLU A&B, COVID)  RVPGX2
Influenza A by PCR: NEGATIVE
Influenza B by PCR: NEGATIVE
Resp Syncytial Virus by PCR: NEGATIVE
SARS Coronavirus 2 by RT PCR: NEGATIVE

## 2024-02-27 LAB — PREPARE RBC (CROSSMATCH)

## 2024-02-27 LAB — FERRITIN: Ferritin: 251 ng/mL (ref 11–307)

## 2024-02-27 LAB — SAMPLE TO BLOOD BANK

## 2024-02-27 MED ORDER — INSULIN ASPART 100 UNIT/ML IJ SOLN
0.0000 [IU] | Freq: Three times a day (TID) | INTRAMUSCULAR | Status: DC
  Administered 2024-02-27: 11 [IU] via SUBCUTANEOUS
  Administered 2024-02-28 (×2): 3 [IU] via SUBCUTANEOUS
  Administered 2024-02-28: 11 [IU] via SUBCUTANEOUS
  Administered 2024-02-29: 4 [IU] via SUBCUTANEOUS
  Filled 2024-02-27: qty 11
  Filled 2024-02-27: qty 3
  Filled 2024-02-27: qty 11
  Filled 2024-02-27: qty 3
  Filled 2024-02-27: qty 4

## 2024-02-27 MED ORDER — STERILE WATER FOR INJECTION IV SOLN
INTRAVENOUS | Status: DC
  Filled 2024-02-27 (×2): qty 150
  Filled 2024-02-27: qty 1000

## 2024-02-27 MED ORDER — ACETAMINOPHEN 325 MG PO TABS: 650.0000 mg | ORAL_TABLET | Freq: Four times a day (QID) | ORAL | Status: DC | PRN

## 2024-02-27 MED ORDER — PANTOPRAZOLE SODIUM 40 MG IV SOLR
40.0000 mg | Freq: Two times a day (BID) | INTRAVENOUS | Status: DC
  Administered 2024-02-27 – 2024-02-29 (×4): 40 mg via INTRAVENOUS
  Filled 2024-02-27 (×4): qty 10

## 2024-02-27 MED ORDER — INSULIN ASPART 100 UNIT/ML IV SOLN
10.0000 [IU] | Freq: Once | INTRAVENOUS | Status: AC
  Administered 2024-02-27: 10 [IU] via INTRAVENOUS
  Filled 2024-02-27: qty 10

## 2024-02-27 MED ORDER — INSULIN GLARGINE 100 UNIT/ML ~~LOC~~ SOLN
50.0000 [IU] | Freq: Every day | SUBCUTANEOUS | Status: DC
  Administered 2024-02-28 – 2024-02-29 (×2): 50 [IU] via SUBCUTANEOUS
  Filled 2024-02-27 (×2): qty 0.5

## 2024-02-27 MED ORDER — FUROSEMIDE 10 MG/ML IJ SOLN
40.0000 mg | Freq: Once | INTRAMUSCULAR | Status: AC
  Administered 2024-02-27: 40 mg via INTRAVENOUS
  Filled 2024-02-27: qty 4

## 2024-02-27 MED ORDER — ACETAMINOPHEN 650 MG RE SUPP: 650.0000 mg | Freq: Four times a day (QID) | RECTAL | Status: DC | PRN

## 2024-02-27 MED ORDER — ALBUTEROL SULFATE (2.5 MG/3ML) 0.083% IN NEBU: 3.0000 mL | INHALATION_SOLUTION | Freq: Four times a day (QID) | RESPIRATORY_TRACT | Status: DC | PRN

## 2024-02-27 MED ORDER — INSULIN ASPART 100 UNIT/ML IJ SOLN
10.0000 [IU] | Freq: Three times a day (TID) | INTRAMUSCULAR | Status: DC
  Administered 2024-02-28 – 2024-02-29 (×2): 10 [IU] via SUBCUTANEOUS
  Filled 2024-02-27 (×2): qty 10

## 2024-02-27 MED ORDER — PREDNISONE 10 MG PO TABS: 10.0000 mg | ORAL_TABLET | Freq: Every day | ORAL | 0 refills | Status: DC

## 2024-02-27 MED ORDER — SODIUM BICARBONATE 8.4 % IV SOLN
50.0000 meq | Freq: Once | INTRAVENOUS | Status: AC
  Administered 2024-02-27: 50 meq via INTRAVENOUS
  Filled 2024-02-27: qty 50

## 2024-02-27 MED ORDER — PREDNISONE 50 MG PO TABS
60.0000 mg | ORAL_TABLET | Freq: Every day | ORAL | Status: DC
  Administered 2024-02-28 – 2024-02-29 (×2): 60 mg via ORAL
  Filled 2024-02-27: qty 3
  Filled 2024-02-27: qty 1

## 2024-02-27 MED ORDER — HYDRALAZINE HCL 20 MG/ML IJ SOLN: 5.0000 mg | Freq: Four times a day (QID) | INTRAMUSCULAR | Status: DC | PRN

## 2024-02-27 MED ORDER — SODIUM CHLORIDE 0.9 % IV BOLUS
1000.0000 mL | Freq: Once | INTRAVENOUS | Status: AC
  Administered 2024-02-27: 1000 mL via INTRAVENOUS

## 2024-02-27 MED ORDER — SODIUM ZIRCONIUM CYCLOSILICATE 10 G PO PACK
10.0000 g | PACK | Freq: Once | ORAL | Status: AC
  Administered 2024-02-27: 10 g via ORAL
  Filled 2024-02-27: qty 1

## 2024-02-27 MED ORDER — TIZANIDINE HCL 2 MG PO TABS
4.0000 mg | ORAL_TABLET | Freq: Two times a day (BID) | ORAL | Status: DC | PRN
  Administered 2024-02-28 (×2): 4 mg via ORAL
  Filled 2024-02-27 (×2): qty 2

## 2024-02-27 MED ORDER — AMITRIPTYLINE HCL 10 MG PO TABS
10.0000 mg | ORAL_TABLET | Freq: Every day | ORAL | Status: DC
  Administered 2024-02-28 (×2): 10 mg via ORAL
  Filled 2024-02-27 (×4): qty 1

## 2024-02-27 MED ORDER — FLUTICASONE PROPIONATE 50 MCG/ACT NA SUSP
2.0000 | Freq: Every day | NASAL | Status: DC
  Filled 2024-02-27 (×2): qty 16

## 2024-02-27 MED ORDER — CALCIUM GLUCONATE-NACL 1-0.675 GM/50ML-% IV SOLN
1.0000 g | Freq: Once | INTRAVENOUS | Status: AC
  Administered 2024-02-27: 1000 mg via INTRAVENOUS
  Filled 2024-02-27: qty 50

## 2024-02-27 MED ORDER — PREDNISONE 20 MG PO TABS: 10.0000 mg | ORAL_TABLET | Freq: Every day | ORAL | Status: DC

## 2024-02-27 MED ORDER — VITAMIN B-12 1000 MCG PO TABS
2500.0000 ug | ORAL_TABLET | Freq: Every day | ORAL | Status: DC
  Administered 2024-02-28 – 2024-02-29 (×2): 2500 ug via ORAL
  Filled 2024-02-27 (×2): qty 3

## 2024-02-27 MED ORDER — FERROUS SULFATE 325 (65 FE) MG PO TABS
325.0000 mg | ORAL_TABLET | Freq: Every day | ORAL | Status: DC
  Administered 2024-02-28 – 2024-02-29 (×2): 325 mg via ORAL
  Filled 2024-02-27 (×2): qty 1

## 2024-02-27 MED ORDER — DEXTROSE 50 % IV SOLN
1.0000 | Freq: Once | INTRAVENOUS | Status: AC
  Administered 2024-02-27: 50 mL via INTRAVENOUS
  Filled 2024-02-27: qty 50

## 2024-02-27 MED ORDER — ALBUTEROL SULFATE (2.5 MG/3ML) 0.083% IN NEBU
10.0000 mg | INHALATION_SOLUTION | Freq: Once | RESPIRATORY_TRACT | Status: AC
  Administered 2024-02-27: 10 mg via RESPIRATORY_TRACT
  Filled 2024-02-27: qty 12

## 2024-02-27 MED ORDER — SENNOSIDES-DOCUSATE SODIUM 8.6-50 MG PO TABS: 1.0000 | ORAL_TABLET | Freq: Every evening | ORAL | Status: DC | PRN

## 2024-02-27 MED ORDER — METOPROLOL TARTRATE 25 MG PO TABS
25.0000 mg | ORAL_TABLET | Freq: Two times a day (BID) | ORAL | Status: DC
  Administered 2024-02-27 – 2024-02-29 (×4): 25 mg via ORAL
  Filled 2024-02-27 (×4): qty 1

## 2024-02-27 MED ORDER — OXYCODONE-ACETAMINOPHEN 7.5-325 MG PO TABS
1.0000 | ORAL_TABLET | Freq: Four times a day (QID) | ORAL | Status: DC | PRN
  Administered 2024-02-28 – 2024-02-29 (×3): 1 via ORAL
  Filled 2024-02-27 (×3): qty 1

## 2024-02-27 MED ADMIN — Gabapentin Cap 400 MG: 800 mg | ORAL | NDC 60687060211

## 2024-02-27 MED FILL — Gabapentin Cap 400 MG: 800.0000 mg | ORAL | Qty: 2 | Status: AC

## 2024-02-27 NOTE — Progress Notes (Signed)
 CRITICAL VALUE STICKER  CRITICAL VALUE: K+ 6.6  RECEIVER (on-site recipient of call):  DATE & TIME NOTIFIED: 1045  MESSENGER (representative from lab): [place], [place]  MD NOTIFIED: [place]  TIME OF NOTIFICATION: 1115  RESPONSE: Pt needs to go to ED

## 2024-02-27 NOTE — ED Triage Notes (Signed)
 Quick triage note, pt to ED c/o abnormal labs. Was contacted by MD office and told k 6.6 and hgb 7.5. , denies CP, Hx anemia

## 2024-02-27 NOTE — ED Provider Triage Note (Signed)
 Emergency Medicine Provider Triage Evaluation Note  [name] , a 43 y.o. female  was evaluated in triage.  Pt complains of patient seen by hematology this morning, routine for chronic hemolytic anemia. She has been scheduled for transfusion for 12/13. She was sent to ED after her potassium resulted at 6.6. Only symptoms are fatigue, worn out, chronic left hip pain.  Review of Systems  Positive:  Negative:   Physical Exam  BP (!) 120/57 (BP Location: Right Arm)   Pulse 85   Temp 98 F (36.7 C)   Resp 18   SpO2 100%  Gen:   Awake, no distress   Resp:  Normal effort  MSK:   Moves extremities without difficulty  Other:    Medical Decision Making  Medically screening exam initiated at 2:25 PM.  Appropriate orders placed.  Jennifr L Turnipseed was informed that the remainder of the evaluation will be completed by another provider, this initial triage assessment does not replace that evaluation, and the importance of remaining in the ED until their evaluation is complete.  Lokelma  provided in triage. EKG without peaked t-waves.   Odell Balls, PA-C 02/27/24 1428

## 2024-02-27 NOTE — ED Provider Notes (Signed)
 [place] EMERGENCY DEPARTMENT AT [facility] Provider Note   CSN: [id number] Arrival date & time: 02/27/24  1250     Patient presents with: Abnormal Lab   Melinda Stone is a 43 y.o. female.   43 year old female with past medical history of chronic anemia, diabetes, and hypertension presenting to the emergency department today with concern for elevated potassium on outpatient testing.  The patient states that she has been having some generalized fatigue.  Denies any palpitations or lightheadedness.  She has been taking her medications as prescribed and denies being started on any new medications.  She states that she has had episodes with her potassium once in the past but she is not aware of any obvious cause for this.  She was sent here today for further evaluation.  Potassium remains elevated.  She does report heavy periods and has had anemia for quite some time.   Abnormal Lab      Prior to Admission medications  Medication Sig Start Date End Date Taking? Authorizing Provider  acetaminophen  (TYLENOL ) 500 MG tablet Take 1,000 mg by mouth every 6 (six) hours as needed for mild pain (pain score 1-3) or moderate pain (pain score 4-6).    [provider]  albuterol  (VENTOLIN  HFA) 108 (90 Base) MCG/ACT inhaler Inhale 2 puffs into the lungs every 6 (six) hours as needed for wheezing or shortness of breath. 05/29/23   Setzer, Nena PARAS, PA-C  amitriptyline  (ELAVIL ) 10 MG tablet Take 10 mg by mouth at bedtime. 10/22/23   [provider]  amLODipine  (NORVASC ) 5 MG tablet Take 1 tablet (5 mg total) by mouth daily. 11/24/23 01/24/24  Arrien, Elidia Sieving, MD  Continuous Glucose Sensor (DEXCOM G7 SENSOR) MISC 1 Device by Does not apply route as directed. Change sensor every 10 days 06/21/23   Shamleffer, Ibtehal Jaralla, MD  Cyanocobalamin  (VITAMIN B-12) 2500 MCG SUBL Place 1 tablet (2,500 mcg total) under the tongue daily. 04/23/23   Babara Call, MD  ferrous sulfate   325 (65 FE) MG tablet Take 1 tablet (325 mg total) by mouth daily with breakfast. 05/29/23   Setzer, Sandra J, PA-C  fluticasone  (FLONASE ) 50 MCG/ACT nasal spray Place 2 sprays into both nostrils daily. 10/31/23   Odell Celinda Balo, MD  furosemide  (LASIX ) 20 MG tablet TAKE 1 TABLET(20 MG) BY MOUTH IN THE MORNING 11/14/23   Tolia, Sunit, DO  gabapentin  (NEURONTIN ) 800 MG tablet Take 800 mg by mouth 3 (three) times daily.    [provider]  Galcanezumab -gnlm (EMGALITY ) 120 MG/ML SOAJ Inject 120 mg into the skin every 30 (thirty) days. 08/20/23   Gayland Lauraine PARAS, NP  Insulin  Disposable Pump (OMNIPOD 5 G7 PODS, GEN 5,) MISC 1 Device by Does not apply route every other day. 09/26/23   Shamleffer, Ibtehal Jaralla, MD  insulin  glargine (LANTUS  SOLOSTAR) 100 UNIT/ML Solostar Pen Inject 50 Units into the skin daily before breakfast. 12/27/23   Shamleffer, Ibtehal Jaralla, MD  insulin  lispro (HUMALOG  KWIKPEN) 100 UNIT/ML KwikPen Inject 12-22 Units into the skin 3 (three) times daily. Max daily 09/26/23   Shamleffer, Donell Cardinal, MD  Insulin  Pen Needle 32G X 4 MM MISC 1 Device by Does not apply route in the morning, at noon, in the evening, and at bedtime. 09/26/23   Shamleffer, Ibtehal Jaralla, MD  metoprolol  tartrate (LOPRESSOR ) 25 MG tablet Take 1 tablet (25 mg total) by mouth 2 (two) times daily. 11/24/23   Arrien, Elidia Sieving, MD  mometasone -formoterol  (DULERA ) 100-5 MCG/ACT AERO  Inhale 2 puffs into the lungs 2 (two) times daily at 10 AM and 5 PM. 05/29/23   Setzer, Sandra J, PA-C  Multiple Vitamin (MULTIVITAMIN WITH MINERALS) TABS tablet Take 1 tablet by mouth daily. 05/29/23   Setzer, Sandra J, PA-C  oxyCODONE -acetaminophen  (PERCOCET) 7.5-325 MG tablet Take 1 tablet by mouth 4 (four) times daily as needed for moderate pain (pain score 4-6). 10/22/23   [provider]  pantoprazole  (PROTONIX ) 40 MG tablet Take 1 tablet (40 mg total) by mouth daily. 05/29/23   Setzer, Sandra J, PA-C  predniSONE   (DELTASONE ) 10 MG tablet Take 1 tablet (10 mg total) by mouth daily with breakfast. 02/27/24   Iruku, Praveena, MD  Rimegepant Sulfate (NURTEC) 75 MG TBDP Take 1 tablet (75 mg total) by mouth as needed. Take 1 tablet at onset of headache, max is 1 tablet in 24 hours. 12/10/23   Gayland Lauraine PARAS, NP  tiZANidine  (ZANAFLEX ) 4 MG tablet Take 4 mg by mouth every 12 (twelve) hours as needed for muscle spasms. 06/10/23   [provider]  vitamin C  (ASCORBIC ACID ) 250 MG tablet Take 2 tablets (500 mg total) by mouth daily. 04/10/22   Babara Call, MD    Allergies: Trulicity  Mikeal ]    Review of Systems  Constitutional:  Positive for fatigue.  All other systems reviewed and are negative.   Updated Vital Signs BP (!) 150/67   Pulse 79   Temp 98.5 F (36.9 C) (Oral)   Resp 18   SpO2 100%   Physical Exam Vitals and nursing note reviewed.   Gen: NAD Eyes: PERRL, EOMI HEENT: no oropharyngeal swelling Neck: trachea midline Resp: clear to auscultation bilaterally Card: RRR, no murmurs, rubs, or gallops Abd: nontender, nondistended Extremities: no calf tenderness, no edema Vascular: 2+ radial pulses bilaterally, 2+ DP pulses bilaterally Skin: no rashes Psyc: acting appropriately   (all labs ordered are listed, but only abnormal results are displayed) Labs Reviewed  COMPREHENSIVE METABOLIC PANEL WITH GFR - Abnormal; Notable for the following components:      Result Value   Potassium 6.4 (*)    Chloride 115 (*)    CO2 16 (*)    Glucose, Bld 226 (*)    Creatinine, Ser 1.15 (*)    Calcium  8.3 (*)    Total Protein 6.1 (*)    Albumin 3.3 (*)    AST 14 (*)    All other components within normal limits  CBG MONITORING, ED - Abnormal; Notable for the following components:   Glucose-Capillary 244 (*)    All other components within normal limits    EKG: EKG Interpretation Date/Time:  Thursday February 27 2024 14:19:39 EST Ventricular Rate:  80 PR Interval:  188 QRS  Duration:  70 QT Interval:  376 QTC Calculation: 433 R Axis:   42  Text Interpretation: Normal sinus rhythm Low voltage QRS Borderline ECG When compared with ECG of 23-Nov-2023 08:32, PREVIOUS ECG IS PRESENT Confirmed by Ula Barter [phone]) on 02/27/2024 5:04:12 PM  Radiology: No results found.   Procedures   Medications Ordered in the ED  sodium zirconium cyclosilicate  (LOKELMA ) packet 10 g (10 g Oral Given 02/27/24 1437)  insulin  aspart (novoLOG ) injection 10 Units (10 Units Intravenous Given 02/27/24 1756)    And  dextrose  50 % solution 50 mL (50 mLs Intravenous Given 02/27/24 1753)  albuterol  (PROVENTIL ) (2.5 MG/3ML) 0.083% nebulizer solution 10 mg (10 mg Nebulization Given 02/27/24 1749)  calcium  gluconate 1 g/ 50 mL sodium chloride  IVPB (1,000  mg Intravenous New Bag/Given 02/27/24 1755)  sodium bicarbonate  injection 50 mEq (50 mEq Intravenous Given 02/27/24 1754)  sodium chloride  0.9 % bolus 1,000 mL (1,000 mLs Intravenous New Bag/Given 02/27/24 1752)                                    Medical Decision Making 42 year old female with past medical history of diabetes, hypertension, and hyperlipidemia presenting to the emergency department today with hyperkalemia.  The patient did have labs rechecked here and this does appear to be persistently elevated.  Her EKG interpreted by me shows a sinus rhythm with normal intervals and narrow QRS.  There is some mild peaking of her T waves.  The patient was given dextrose , insulin , calcium , bicarbonate, and Lokelma  here.  She also given albuterol .  A call was placed for admission for further monitoring.  The patient's hemoglobin is also low but not to the range where she needs a transfusion.  This can be monitored in the inpatient setting but this is not very far off of her baseline.  CRITICAL CARE Performed by: Prentice JONELLE Medicus   Total critical care time: 40 minutes  Critical care time was exclusive of separately billable procedures and  treating other patients.  Critical care was necessary to treat or prevent imminent or life-threatening deterioration.  Critical care was time spent personally by me on the following activities: development of treatment plan with patient and/or surrogate as well as nursing, discussions with consultants, evaluation of patient's response to treatment, examination of patient, obtaining history from patient or surrogate, ordering and performing treatments and interventions, ordering and review of laboratory studies, ordering and review of radiographic studies, pulse oximetry and re-evaluation of patient's condition.   Risk OTC drugs. Prescription drug management. Decision regarding hospitalization.        Final diagnoses:  Hyperkalemia    ED Discharge Orders     None          Medicus Prentice JONELLE, MD 02/27/24 1836

## 2024-02-27 NOTE — ED Notes (Signed)
 Pt states that staff uses US  to place PIV.

## 2024-02-27 NOTE — ED Notes (Signed)
 Extra Lav, Red and Pink tubes drawn

## 2024-02-27 NOTE — ED Triage Notes (Signed)
 See Quick Triage note.  Pt c/o fatigue and head, low back, and BLE pain x1 week.  Pain score 8/10.

## 2024-02-27 NOTE — Progress Notes (Signed)
 Verbal order given to administer 1 unit of PRBC from Dr. Loretha.  Spoke with Garrel in blood bank.  Type and screen order received, patient will have 1 unit of PRBC transfused on 02/29/2024. Will call and make patient aware of appointment for 02/29/24 @ 0830.  Patient made aware

## 2024-02-27 NOTE — H&P (Signed)
 History and Physical    Patient: Melinda Stone FMW:[id number] DOB: [birth year] DOA: 02/27/2024 DOS: the patient was seen and examined on 02/27/2024 PCP: [name], NP  Patient coming from: Home  Chief Complaint:  Chief Complaint  Patient presents with   Abnormal Lab   HPI: Melinda Stone is a 43 y.o. 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  She has since had multiple PRBC transfusions and has been receiving IV iron  and EPO.  Other comorbidities include hypertension, migraine headaches, polyneuropathy.  She presents to the emergency room after being referred by primary hematologist on account of low hemoglobin count and hyperkalemia.  She admits to exertional dyspnea but denies any orthopnea, chest pain.  In the ED, patient was noted to be tachycardic with heart rate in the 119.  Vitals were stable however.  She complains of URI symptoms.  Respiratory panel has been sent and pending.   Review of Systems: As mentioned in the history of present illness. All other systems reviewed and are negative. Past Medical History:  Diagnosis Date   Anemia    Arthritis 08/17/2020   Chronic back pain    Diabetes mellitus without complication (HCC)    type 2   GERD (gastroesophageal reflux disease)    Hypertension    Migraine    Ovarian cyst    Pneumonia    Polyneuropathy    Past Surgical History:  Procedure Laterality Date   CATARACT EXTRACTION Right 11/08/2023   CESAREAN SECTION     x4   CHOLECYSTECTOMY  2004   EYE SURGERY Left    cataract   EYE SURGERY Left 10/2023   retinal surgery x2   HYSTEROSCOPY N/A 10/01/2023   Procedure: ABLATION, ENDOMETRIUM, HYSTEROSCOPIC;  Surgeon: Alger Gong, MD;  Location: [facility] OR;  Service: Gynecology;  Laterality:  N/A;   IR BONE MARROW BIOPSY & ASPIRATION  08/01/2023   LUMBAR LAMINECTOMY N/A 05/30/2020   Procedure: Lumbar five Laminectomy,  Bilateral Microdiscectomy, Left Lumbar five -Sacral one Microdiscectomy;  Surgeon: Barbarann Oneil BROCKS, MD;  Location: [facility] OR;  Service: Orthopedics;  Laterality: N/A;   LUMBAR LAMINECTOMY Left 11/14/2020   Procedure: LEFT LUMBAR FOUR-FIVE MICRODISCECTOMY;  Surgeon: Barbarann Oneil BROCKS, MD;  Location: [facility] OR;  Service: Orthopedics;  Laterality: Left;   TUBAL LIGATION  10/19/2010   Social History:  reports that she has been smoking cigars. She has never been exposed to tobacco smoke. She has never used smokeless tobacco. She reports that she does not currently use alcohol . She reports that she does not currently use drugs.  Allergies[1]  Family History  Problem Relation Age of Onset   Diabetes Mother    Hypertension Mother    Stroke Mother    Healthy Sister    Hypertension Brother    Asthma Brother    Hypertension Brother    Healthy Son    Healthy Daughter    Healthy Daughter     Prior to Admission medications  Medication Sig Start Date End Date Taking? Authorizing Provider  acetaminophen  (TYLENOL ) 500 MG tablet Take 1,000 mg by mouth every 6 (six) hours as needed for mild pain (pain score 1-3) or moderate pain (pain score 4-6).    [provider]  albuterol  (VENTOLIN  HFA) 108 (90 Base) MCG/ACT inhaler Inhale 2 puffs into the lungs every 6 (six) hours as needed for  wheezing or shortness of breath. 05/29/23   Setzer, Nena PARAS, PA-C  amitriptyline  (ELAVIL ) 10 MG tablet Take 10 mg by mouth at bedtime. 10/22/23   [provider]  amLODipine  (NORVASC ) 5 MG tablet Take 1 tablet (5 mg total) by mouth daily. 11/24/23 01/24/24  Arrien, Elidia Sieving, MD  Continuous Glucose Sensor (DEXCOM G7 SENSOR) MISC 1 Device by Does not apply route as directed. Change sensor every 10 days 06/21/23   Shamleffer, Ibtehal Jaralla, MD  Cyanocobalamin  (VITAMIN B-12) 2500 MCG SUBL Place  1 tablet (2,500 mcg total) under the tongue daily. 04/23/23   Babara Call, MD  ferrous sulfate  325 (65 FE) MG tablet Take 1 tablet (325 mg total) by mouth daily with breakfast. 05/29/23   Setzer, Sandra J, PA-C  fluticasone  (FLONASE ) 50 MCG/ACT nasal spray Place 2 sprays into both nostrils daily. 10/31/23   Odell Celinda Balo, MD  furosemide  (LASIX ) 20 MG tablet TAKE 1 TABLET(20 MG) BY MOUTH IN THE MORNING 11/14/23   Tolia, Sunit, DO  gabapentin  (NEURONTIN ) 800 MG tablet Take 800 mg by mouth 3 (three) times daily.    [provider]  Galcanezumab -gnlm (EMGALITY ) 120 MG/ML SOAJ Inject 120 mg into the skin every 30 (thirty) days. 08/20/23   Gayland Lauraine PARAS, NP  Insulin  Disposable Pump (OMNIPOD 5 G7 PODS, GEN 5,) MISC 1 Device by Does not apply route every other day. 09/26/23   Shamleffer, Ibtehal Jaralla, MD  insulin  glargine (LANTUS  SOLOSTAR) 100 UNIT/ML Solostar Pen Inject 50 Units into the skin daily before breakfast. 12/27/23   Shamleffer, Ibtehal Jaralla, MD  insulin  lispro (HUMALOG  KWIKPEN) 100 UNIT/ML KwikPen Inject 12-22 Units into the skin 3 (three) times daily. Max daily 09/26/23   Shamleffer, Donell Cardinal, MD  Insulin  Pen Needle 32G X 4 MM MISC 1 Device by Does not apply route in the morning, at noon, in the evening, and at bedtime. 09/26/23   Shamleffer, Ibtehal Jaralla, MD  metoprolol  tartrate (LOPRESSOR ) 25 MG tablet Take 1 tablet (25 mg total) by mouth 2 (two) times daily. 11/24/23   Arrien, Mauricio Daniel, MD  mometasone -formoterol  (DULERA ) 100-5 MCG/ACT AERO Inhale 2 puffs into the lungs 2 (two) times daily at 10 AM and 5 PM. 05/29/23   Setzer, Sandra J, PA-C  Multiple Vitamin (MULTIVITAMIN WITH MINERALS) TABS tablet Take 1 tablet by mouth daily. 05/29/23   Setzer, Sandra J, PA-C  oxyCODONE -acetaminophen  (PERCOCET) 7.5-325 MG tablet Take 1 tablet by mouth 4 (four) times daily as needed for moderate pain (pain score 4-6). 10/22/23   [provider]  pantoprazole  (PROTONIX ) 40 MG  tablet Take 1 tablet (40 mg total) by mouth daily. 05/29/23   Setzer, Sandra J, PA-C  predniSONE  (DELTASONE ) 10 MG tablet Take 1 tablet (10 mg total) by mouth daily with breakfast. 02/27/24   Iruku, Praveena, MD  Rimegepant Sulfate (NURTEC) 75 MG TBDP Take 1 tablet (75 mg total) by mouth as needed. Take 1 tablet at onset of headache, max is 1 tablet in 24 hours. 12/10/23   Gayland Lauraine PARAS, NP  tiZANidine  (ZANAFLEX ) 4 MG tablet Take 4 mg by mouth every 12 (twelve) hours as needed for muscle spasms. 06/10/23   [provider]  vitamin C  (ASCORBIC ACID ) 250 MG tablet Take 2 tablets (500 mg total) by mouth daily. 04/10/22   Babara Call, MD    Physical Exam: Vitals:   02/27/24 1800 02/27/24 1830 02/27/24 1837 02/27/24 1945  BP: (!) 150/67 (!) 152/96  (!) 136/59  Pulse:    ROLLEN)  116  Resp: 18  18 15   Temp:      TempSrc:      SpO2:    100%   General: Patient is very well-appearing female.  She was seen laying comfortably in bed.  Not in any acute distress. HEENT: Head is atraumatic, pupils equal round reactive. Neck supple with no JVD Chest: Diminished breath sounds bilaterally at the base of the lungs Cardiovascular: Tachycardic, no murmur appreciated. abdomen: Soft nontender with organomegaly Extremities with trace edema CNS shows no focal deficit. Data Reviewed: WBCs 136, potassium 6.4, chloride is 115, bicarb is 16, glucose is 226, BUN is 17, creatinine is 1.15, anion gap is 5, AST is 14, ALT 8, total bilirubin 0.9.  LDH is 196 Iron  is 76, TIBC is 290, ferritin is 251 for WBCs 5.9, hemoglobin is 7.5, hematocrit 22.4, platelet count is 170, neutrophil count is 67, lymphocytes 22 Absolute reticulocyte count is 407, immature reticulocyte fraction is 16.3.  Assessment and Plan:  43 year old female with recurrent hyperkalemia and anemia.  Patient was referred by primary hematologist for further management of worsening anemia and hyperkalemia.  #1.  Acute on chronic anemia.  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  She follows up with: Hematology.  Patient was seen earlier today.  Negative Coombs, PNH and autoimmune workup.  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  Denies any bleeding diathesis.  Hemoglobin on this admission is 7.5.  Hematology recommends maintaining hemoglobin above 7.  No immediate indication for PRBC transfusion at this time.  Transfuse if hemoglobin less than 7. Daily CBC, CMP, reticulocyte, LDH and haptoglobin was advised. Patient was also recommended to be initiated on prednisone  60 mg daily Will consult with hematology during the course of stay.  #2.  Hyperkalemia: In a patient with chronic diabetes mellitus. 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  Patient will be initiated on bicarb drip per my discussion with nephrology consult tonight.  Will monitor patients improve serum potassium.  Lokelma  and IV bicarb was given in the ED.  Continue with IV fluids.  #3.  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  Discussed case with nephrology.  Patient was initiated on bicarb drip.  Monitor serum bicarb and potassium as appropriate.  #4. Type 2 diabetes mellitus: On insulin  therapy at home.  Patient uses an insulin  pump at home.  She has run out of supplies at this time.  Hence, patient was initiated on long-acting insulin  with Premeal insulin .  Insulin  sliding scale has also been instituted for adequate coverage.  Hematology had recommended initiation of prednisone  60 mg.  Risk of uncontrolled hyperglycemia with steroids anticipated.  Optimize treatment as appropriate.  #4.  Essential hypertension: Patient will continue with home regimen of metoprolol .  Blood pressure monitoring per unit protocol.  #5.  History of recurrent headaches.  Most likely attributed to migraine-like headaches.  Not in any acute exacerbation  at this time.  Patient will continue with home medications.    Advance Care Planning:   Code Status: Full Code   Consults: Consider nephrology follow-up in a.m. if hyperkalemia is persistent  Family Communication: No family at bedside at this time  Severity of Illness: The appropriate patient status for this patient is INPATIENT. Inpatient status is judged to be reasonable and necessary in order to provide the required intensity of service to ensure the patient's safety. The patient's presenting symptoms, physical  exam findings, and initial radiographic and laboratory data in the context of their chronic comorbidities is felt to place them at high risk for further clinical deterioration. Furthermore, it is not anticipated that the patient will be medically stable for discharge from the hospital within 2 midnights of admission.   * I certify that at the point of admission it is my clinical judgment that the patient will require inpatient hospital care spanning beyond 2 midnights from the point of admission due to high intensity of service, high risk for further deterioration and high frequency of surveillance required.*  Author: Maude MARLA Dart, MD 02/27/2024 8:21 PM  For on call review [email or web address].     [1]  Allergies Allergen Reactions   Trulicity  [Dulaglutide ] Diarrhea

## 2024-02-27 NOTE — Progress Notes (Signed)
 [facility] CONSULT NOTE  Patient Care Team: [name], NP as PCP - General (Nurse Practitioner) Michele Richardson, DO as PCP - Cardiology (Cardiology) Babara Call, MD as Consulting Physician (Oncology)  CHIEF COMPLAINTS/PURPOSE OF CONSULTATION:  Hemolytic anemia  ASSESSMENT & PLAN:  No problem-specific Assessment & Plan notes found for this encounter.  No orders of the defined types were placed in this encounter.  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  Today her labs show a drop in Hb, hyperkalemia, elevated reticulocyte count Before her potassium labs resulted, we sent her home with steroid taper challenge and out pt PRBC transfusion, but not that her potassium is very high, she will need to go to the ED Please consider starting her on prednisone  60 mg daily, PPI prophylaxis, PRBC transfusion to maintain a Hb of 7 and daily CBC, CMP , retic, LDH and haptoglobin She had extensive investigation to find the cause of her hemolysis in the past I will do this investigation out pt after discharge.  Type 2 diabetes mellitus Type 2 diabetes mellitus, previously exacerbated by steroid therapy.-  Will continue to monitor  I will see her in the hospital tomorrow.    Weight HISTORY OF PRESENTING ILLNESS:  Melinda Stone 43 y.o. female is here because of hemolytic anemia  This is followed by hematology over the past several years patient has been extensively evaluated for underlying hemolytic anemia. Very complex history, multiple hospitalization at times for blood loss anemia and some times for possible hemolysis. No evidence of nutritional  deficiency She did have episodes of hemolysis in the past but not classic hemolysis and required blood transfusion, DAT neg, no cold agglutinin. She responded to steroids however she didn't tolerate them well, hence discontinued. Previous bone marrow biopsy showed Slightly hypercellular bone marrow with erythroid hyperplasia, No viral cytopathic changes or significant dyspoiesis. Suspect secondary process. Normal cytogenetics, normal myeloid NGS  MPN testing negative. CT chest abdomen pelvis with no lymphoproliferative disease. She says she wanted a second opinion since she doesn't seem to know why she is anemic.  Interval History  Discussed the use of AI scribe software for clinical note transcription with the patient, who gave verbal consent to proceed.  History of Present Illness  Melinda Stone is a 43 year old female with chronic hemolytic anemia and menorrhagia who presents for hematology follow-up due to persistently low hemoglobin.  She has chronic anemia with persistently low hemoglobin, currently 7.2 g/dL, decreased from 8.6 g/dL last month. She remains transfusion-dependent and has previously received steroids, which  have reduced her transfusion requirements  She continues to experience heavy menstrual bleeding, with her current cycle ongoing and described as severe menorrhagia. She reports no significant change in symptoms since her last visit, stating her body is accustomed to low hemoglobin. She denies increased fatigue or shortness of breath this week, though she experiences chronic tiredness and mild dyspnea.  Her 70 year old daughter also has severe anemia, with hemoglobin levels as low as 4-5 g/dL, suggesting a possible familial etiology . She has previously received blood transfusions at this facility and is familiar with the process.  She has type 2 diabetes mellitus and has experienced elevated blood glucose with prior steroid  use, but was able to manage her  sugars. Rest of the pertinent 10 point ROS reviewed and neg  MEDICAL HISTORY:  Past Medical History:  Diagnosis Date   Anemia    Arthritis 08/17/2020   Chronic back pain    Diabetes mellitus without complication (HCC)    type 2   GERD (gastroesophageal reflux disease)    Hypertension    Migraine    Ovarian cyst    Pneumonia    Polyneuropathy     SURGICAL HISTORY: Past Surgical History:  Procedure Laterality Date   CATARACT EXTRACTION Right 11/08/2023   CESAREAN SECTION     x4   CHOLECYSTECTOMY  2004   EYE SURGERY Left    cataract   EYE SURGERY Left 10/2023   retinal surgery x2   HYSTEROSCOPY N/A 10/01/2023   Procedure: ABLATION, ENDOMETRIUM, HYSTEROSCOPIC;  Surgeon: Alger Gong, MD;  Location: [facility] OR;  Service: Gynecology;  Laterality: N/A;   IR BONE MARROW BIOPSY & ASPIRATION  08/01/2023   LUMBAR LAMINECTOMY N/A 05/30/2020   Procedure: Lumbar five Laminectomy,  Bilateral Microdiscectomy, Left Lumbar five -Sacral one Microdiscectomy;  Surgeon: Barbarann Oneil BROCKS, MD;  Location: [facility] OR;  Service: Orthopedics;  Laterality: N/A;   LUMBAR LAMINECTOMY Left 11/14/2020   Procedure: LEFT LUMBAR FOUR-FIVE MICRODISCECTOMY;  Surgeon: Barbarann Oneil BROCKS, MD;  Location: [facility] OR;  Service: Orthopedics;  Laterality: Left;   TUBAL LIGATION  10/19/2010    SOCIAL HISTORY: Social History   Socioeconomic History   Marital status: Single    Spouse name: Not on file   Number of children: 3   Years of education: Not on file   Highest education level: High school graduate  Occupational History    Comment: home maker  Tobacco Use   Smoking status: Every Day    Types: Cigars    Last attempt to quit: 12/23/2021    Years since quitting: 2.1    Passive exposure: Never   Smokeless tobacco: Never  Vaping Use   Vaping status: Never Used  Substance and Sexual Activity   Alcohol  use: Not Currently   Drug use: Not Currently    Comment: per patient stopped smoking marijuana Mar 2022   Sexual  activity: Yes    Birth control/protection: Surgical  Other Topics Concern   Not on file  Social History Narrative   Lives with 3 children   Some coffee   Right handed   Doesn't work   Social Drivers of Health   Tobacco Use: High Risk (02/20/2024)   Received from [facility]   Patient History    Smoking Tobacco Use: Former    Smokeless Tobacco Use: Current    Passive Exposure: Not on Actuary Strain: Not on file  Food Insecurity: No Food Insecurity (11/23/2023)   Epic    Worried About Radiation Protection Practitioner of Food in the Last Year: Never true    Ran Out of Food in the Last Year: Never true  Transportation Needs: No Transportation Needs (11/23/2023)   Epic    Lack of Transportation (Medical): No    Lack of Transportation (Non-Medical): No  Physical Activity: Not on file  Stress: Not on file  Social Connections: Moderately Isolated (07/31/2023)   Social Connection and Isolation Panel    Frequency of Communication with Friends and Family: Twice a week    Frequency of Social Gatherings with Friends and Family: Twice a week    Attends Religious Services: 1 to 4 times per year  Active Member of Clubs or Organizations: No    Attends Banker Meetings: Never    Marital Status: Never married  Intimate Partner Violence: Not At Risk (11/23/2023)   Epic    Fear of Current or Ex-Partner: No    Emotionally Abused: No    Physically Abused: No    Sexually Abused: No  Depression (PHQ2-9): Low Risk (11/29/2023)   Depression (PHQ2-9)    PHQ-2 Score: 0  Alcohol  Screen: Not on file  Housing: Low Risk (11/23/2023)   Epic    Unable to Pay for Housing in the Last Year: No    Number of Times Moved in the Last Year: 0    Homeless in the Last Year: No  Utilities: Not At Risk (11/23/2023)   Epic    Threatened with loss of utilities: No  Health Literacy: Not on file    FAMILY HISTORY: Family History  Problem Relation Age of Onset   Diabetes Mother     Hypertension Mother    Stroke Mother    Healthy Sister    Hypertension Brother    Asthma Brother    Hypertension Brother    Healthy Son    Healthy Daughter    Healthy Daughter     ALLERGIES:  is allergic to trulicity  [dulaglutide ].  MEDICATIONS:  Current Outpatient Medications  Medication Sig Dispense Refill   acetaminophen  (TYLENOL ) 500 MG tablet Take 1,000 mg by mouth every 6 (six) hours as needed for mild pain (pain score 1-3) or moderate pain (pain score 4-6).     albuterol  (VENTOLIN  HFA) 108 (90 Base) MCG/ACT inhaler Inhale 2 puffs into the lungs every 6 (six) hours as needed for wheezing or shortness of breath. 18 g 2   amitriptyline  (ELAVIL ) 10 MG tablet Take 10 mg by mouth at bedtime.     amLODipine  (NORVASC ) 5 MG tablet Take 1 tablet (5 mg total) by mouth daily. 30 tablet 0   Continuous Glucose Sensor (DEXCOM G7 SENSOR) MISC 1 Device by Does not apply route as directed. Change sensor every 10 days 9 each 3   Cyanocobalamin  (VITAMIN B-12) 2500 MCG SUBL Place 1 tablet (2,500 mcg total) under the tongue daily. 30 tablet 2   ferrous sulfate  325 (65 FE) MG tablet Take 1 tablet (325 mg total) by mouth daily with breakfast. 30 tablet 0   fluticasone  (FLONASE ) 50 MCG/ACT nasal spray Place 2 sprays into both nostrils daily. 16 g 0   furosemide  (LASIX ) 20 MG tablet TAKE 1 TABLET(20 MG) BY MOUTH IN THE MORNING 90 tablet 2   gabapentin  (NEURONTIN ) 800 MG tablet Take 800 mg by mouth 3 (three) times daily.     Galcanezumab -gnlm (EMGALITY ) 120 MG/ML SOAJ Inject 120 mg into the skin every 30 (thirty) days. 1.12 mL 11   Insulin  Disposable Pump (OMNIPOD 5 G7 PODS, GEN 5,) MISC 1 Device by Does not apply route every other day. 45 each 3   insulin  glargine (LANTUS  SOLOSTAR) 100 UNIT/ML Solostar Pen Inject 50 Units into the skin daily before breakfast. 45 mL 4   insulin  lispro (HUMALOG  KWIKPEN) 100 UNIT/ML KwikPen Inject 12-22 Units into the skin 3 (three) times daily. Max daily 60 mL 4   Insulin   Pen Needle 32G X 4 MM MISC 1 Device by Does not apply route in the morning, at noon, in the evening, and at bedtime. 400 each 3   metoprolol  tartrate (LOPRESSOR ) 25 MG tablet Take 1 tablet (25 mg total) by mouth 2 (two) times daily.  60 tablet 0   mometasone -formoterol  (DULERA ) 100-5 MCG/ACT AERO Inhale 2 puffs into the lungs 2 (two) times daily at 10 AM and 5 PM. 13 g 0   Multiple Vitamin (MULTIVITAMIN WITH MINERALS) TABS tablet Take 1 tablet by mouth daily.     oxyCODONE -acetaminophen  (PERCOCET) 7.5-325 MG tablet Take 1 tablet by mouth 4 (four) times daily as needed for moderate pain (pain score 4-6).     pantoprazole  (PROTONIX ) 40 MG tablet Take 1 tablet (40 mg total) by mouth daily. 30 tablet 0   Rimegepant Sulfate (NURTEC) 75 MG TBDP Take 1 tablet (75 mg total) by mouth as needed. Take 1 tablet at onset of headache, max is 1 tablet in 24 hours. 8 tablet 11   tiZANidine  (ZANAFLEX ) 4 MG tablet Take 4 mg by mouth every 12 (twelve) hours as needed for muscle spasms.     vitamin C  (ASCORBIC ACID ) 250 MG tablet Take 2 tablets (500 mg total) by mouth daily. 180 tablet 0   No current facility-administered medications for this visit.     PHYSICAL EXAMINATION: ECOG PERFORMANCE STATUS: 2 - Symptomatic, <50% confined to bed  Vitals:   02/27/24 0850  BP: (!) 108/53  Pulse: 81  Resp: 17  Temp: 97.8 F (36.6 C)  SpO2: 100%   Filed Weights   02/27/24 0850  Weight: 218 lb 14.4 oz (99.3 kg)    GENERAL:alert, no distress and comfortable, obese. LYMPH:  no palpable lymphadenopathy in the cervical, axillary  LUNGS: clear to auscultation and percussion with normal breathing effort HEART: regular rate & rhythm and no murmurs and no lower extremity edema Musculoskeletal:no cyanosis of digits and no clubbing  PSYCH: alert & oriented x 3 with fluent speech NEURO: no focal motor/sensory deficits  LABORATORY DATA:  I have reviewed the data as listed Lab Results  Component Value Date   WBC 5.8  02/27/2024   HGB 7.2 (L) 02/27/2024   HCT 21.6 (L) 02/27/2024   MCV 84.4 02/27/2024   PLT 164 02/27/2024     Chemistry      Component Value Date/Time   NA 134 (L) 01/24/2024 1551   NA 137 07/23/2023 1200   K 5.7 (H) 01/24/2024 1551   CL 112 (H) 01/24/2024 1551   CO2 18 (L) 01/24/2024 1551   BUN 15 01/24/2024 1551   BUN 23 07/23/2023 1200   CREATININE 0.98 01/24/2024 1551      Component Value Date/Time   CALCIUM  8.6 (L) 01/24/2024 1551   ALKPHOS 120 01/24/2024 1551   AST <10 (L) 01/24/2024 1551   ALT 5 01/24/2024 1551   BILITOT 0.8 01/24/2024 1551       RADIOGRAPHIC STUDIES: I have personally reviewed the radiological images as listed and agreed with the findings in the report. No results found.   All questions were answered. The patient knows to call the clinic with any problems, questions or concerns. I spent 40 minutes in the care of this patient including H and P, review of records, counseling and coordination of care.     Amber Stalls, MD 02/27/2024 9:07 AM

## 2024-02-27 NOTE — Telephone Encounter (Signed)
 Called pt to make aware that her K+ is 6.6. Per Dr. Loretha, advised pt to go to ED for further evaluation urgently. Pt verbalized understanding.

## 2024-02-28 ENCOUNTER — Telehealth: Payer: Self-pay | Admitting: Hematology and Oncology

## 2024-02-28 DIAGNOSIS — D509 Iron deficiency anemia, unspecified: Secondary | ICD-10-CM

## 2024-02-28 DIAGNOSIS — N184 Chronic kidney disease, stage 4 (severe): Secondary | ICD-10-CM

## 2024-02-28 DIAGNOSIS — N189 Chronic kidney disease, unspecified: Secondary | ICD-10-CM

## 2024-02-28 DIAGNOSIS — N922 Excessive menstruation at puberty: Secondary | ICD-10-CM

## 2024-02-28 DIAGNOSIS — E119 Type 2 diabetes mellitus without complications: Secondary | ICD-10-CM | POA: Diagnosis not present

## 2024-02-28 DIAGNOSIS — D591 Autoimmune hemolytic anemia, unspecified: Secondary | ICD-10-CM | POA: Diagnosis not present

## 2024-02-28 DIAGNOSIS — N920 Excessive and frequent menstruation with regular cycle: Secondary | ICD-10-CM | POA: Diagnosis not present

## 2024-02-28 DIAGNOSIS — E872 Acidosis, unspecified: Secondary | ICD-10-CM

## 2024-02-28 DIAGNOSIS — Z794 Long term (current) use of insulin: Secondary | ICD-10-CM

## 2024-02-28 DIAGNOSIS — I10 Essential (primary) hypertension: Secondary | ICD-10-CM

## 2024-02-28 LAB — BASIC METABOLIC PANEL WITH GFR
Anion gap: 4 — ABNORMAL LOW (ref 5–15)
Anion gap: 8 (ref 5–15)
BUN: 15 mg/dL (ref 6–20)
BUN: 14 mg/dL (ref 6–20)
CO2: 18 mmol/L — ABNORMAL LOW (ref 22–32)
CO2: 17 mmol/L — ABNORMAL LOW (ref 22–32)
Calcium: 7.5 mg/dL — ABNORMAL LOW (ref 8.9–10.3)
Calcium: 8.4 mg/dL — ABNORMAL LOW (ref 8.9–10.3)
Chloride: 116 mmol/L — ABNORMAL HIGH (ref 98–111)
Chloride: 108 mmol/L (ref 98–111)
Creatinine, Ser: 0.98 mg/dL (ref 0.44–1.00)
Creatinine, Ser: 1.11 mg/dL — ABNORMAL HIGH (ref 0.44–1.00)
GFR, Estimated: >60 mL/min (ref >60)
GFR, Estimated: >60 mL/min (ref >60)
Glucose, Bld: 152 mg/dL — ABNORMAL HIGH (ref 70–99)
Glucose, Bld: 289 mg/dL — ABNORMAL HIGH (ref 70–99)
Potassium: 4.9 mmol/L (ref 3.5–5.1)
Potassium: 6.9 mmol/L (ref 3.5–5.1)
Sodium: 138 mmol/L (ref 135–145)
Sodium: 133 mmol/L — ABNORMAL LOW (ref 135–145)

## 2024-02-28 LAB — CBC
HCT: 26.5 % — ABNORMAL LOW (ref 36.0–46.0)
Hemoglobin: 9.3 g/dL — ABNORMAL LOW (ref 12.0–15.0)
MCH: 29.2 pg (ref 26.0–34.0)
MCHC: 35.1 g/dL (ref 30.0–36.0)
MCV: 83.1 fL (ref 80.0–100.0)
Platelets: 187 K/uL (ref 150–400)
RBC: 3.19 MIL/uL — ABNORMAL LOW (ref 3.87–5.11)
RDW: 18.6 % — ABNORMAL HIGH (ref 11.5–15.5)
WBC: 9 K/uL (ref 4.0–10.5)
nRBC: 0 % (ref 0.0–0.2)

## 2024-02-28 LAB — CBG MONITORING, ED
Glucose-Capillary: 127 mg/dL — ABNORMAL HIGH (ref 70–99)
Glucose-Capillary: 147 mg/dL — ABNORMAL HIGH (ref 70–99)

## 2024-02-28 LAB — CBC WITH DIFFERENTIAL/PLATELET
Abs Immature Granulocytes: 0.03 K/uL (ref 0.00–0.07)
Basophils Absolute: 0 K/uL (ref 0.0–0.1)
Basophils Relative: 1 %
Eosinophils Absolute: 0.2 K/uL (ref 0.0–0.5)
Eosinophils Relative: 3 %
HCT: 20 % — ABNORMAL LOW (ref 36.0–46.0)
Hemoglobin: 6.5 g/dL — CL (ref 12.0–15.0)
Immature Granulocytes: 1 %
Lymphocytes Relative: 19 %
Lymphs Abs: 1.2 K/uL (ref 0.7–4.0)
MCH: 28.3 pg (ref 26.0–34.0)
MCHC: 32.5 g/dL (ref 30.0–36.0)
MCV: 87 fL (ref 80.0–100.0)
Monocytes Absolute: 0.3 K/uL (ref 0.1–1.0)
Monocytes Relative: 5 %
Neutro Abs: 4.6 K/uL (ref 1.7–7.7)
Neutrophils Relative %: 71 %
Platelets: 162 K/uL (ref 150–400)
RBC: 2.3 MIL/uL — ABNORMAL LOW (ref 3.87–5.11)
RDW: 20.5 % — ABNORMAL HIGH (ref 11.5–15.5)
WBC: 6.4 K/uL (ref 4.0–10.5)
nRBC: 0.3 % — ABNORMAL HIGH (ref 0.0–0.2)

## 2024-02-28 LAB — GLUCOSE, CAPILLARY
Glucose-Capillary: 138 mg/dL — ABNORMAL HIGH (ref 70–99)
Glucose-Capillary: 199 mg/dL — ABNORMAL HIGH (ref 70–99)
Glucose-Capillary: 259 mg/dL — ABNORMAL HIGH (ref 70–99)
Glucose-Capillary: 261 mg/dL — ABNORMAL HIGH (ref 70–99)

## 2024-02-28 LAB — PREPARE RBC (CROSSMATCH)

## 2024-02-28 LAB — POTASSIUM: Potassium: 6 mmol/L — ABNORMAL HIGH (ref 3.5–5.1)

## 2024-02-28 MED ORDER — CALCIUM GLUCONATE-NACL 2-0.675 GM/100ML-% IV SOLN
2.0000 g | INTRAVENOUS | Status: AC
  Administered 2024-02-28: 2000 mg via INTRAVENOUS
  Filled 2024-02-28: qty 100

## 2024-02-28 MED ORDER — SODIUM ZIRCONIUM CYCLOSILICATE 10 G PO PACK
10.0000 g | PACK | Freq: Once | ORAL | Status: AC
  Administered 2024-02-28: 10 g via ORAL
  Filled 2024-02-28: qty 1

## 2024-02-28 MED ORDER — INSULIN ASPART 100 UNIT/ML IJ SOLN
10.0000 [IU] | Freq: Once | INTRAMUSCULAR | Status: AC
  Administered 2024-02-28: 10 [IU] via INTRAVENOUS
  Filled 2024-02-28: qty 10

## 2024-02-28 MED ORDER — FUROSEMIDE 10 MG/ML IJ SOLN
20.0000 mg | Freq: Once | INTRAMUSCULAR | Status: AC
  Administered 2024-02-28: 20 mg via INTRAVENOUS
  Filled 2024-02-28: qty 2

## 2024-02-28 MED ORDER — DEXTROSE 50 % IV SOLN
50.0000 mL | Freq: Once | INTRAVENOUS | Status: AC
  Administered 2024-02-28: 50 mL via INTRAVENOUS
  Filled 2024-02-28: qty 50

## 2024-02-28 MED ORDER — SODIUM CHLORIDE 0.9% IV SOLUTION: Freq: Once | INTRAVENOUS | Status: AC

## 2024-02-28 MED ORDER — INFLUENZA VIRUS VACC SPLIT PF (FLUZONE) 0.5 ML IM SUSY
0.5000 mL | PREFILLED_SYRINGE | INTRAMUSCULAR | Status: AC
  Administered 2024-02-29: 0.5 mL via INTRAMUSCULAR
  Filled 2024-02-28: qty 0.5

## 2024-02-28 MED ADMIN — Gabapentin Cap 400 MG: 800 mg | ORAL | NDC 69097081507

## 2024-02-28 NOTE — Inpatient Diabetes Management (Signed)
 Inpatient Diabetes Program Recommendations  AACE/ADA: New Consensus Statement on Inpatient Glycemic Control (2015)  Target Ranges:  Prepandial:   less than 140 mg/dL      Peak postprandial:   less than 180 mg/dL (1-2 hours)      Critically ill patients:  140 - 180 mg/dL   Lab Results  Component Value Date   GLUCAP 138 (H) 02/28/2024   HGBA1C 7.8 (A) 12/27/2023    Review of Glycemic Control  Latest Reference Range & Units 02/27/24 17:36 02/27/24 22:08 02/28/24 02:35 02/28/24 08:17 02/28/24 12:08  Glucose-Capillary 70 - 99 mg/dL 755 (H) 748 (H) 872 (H) 147 (H) 138 (H)   Diabetes history: DM 2 Outpatient Diabetes medications:  Lantus  40 units daily Humalog  12-22 units daily (Pt. Not using omnipod insulin  pump) Current orders for Inpatient glycemic control:  Novolog  0-20 units tid with meals Novolog  10 units tid with meals Lantus  50 units daily Prednisone  60 mg daily  Inpatient Diabetes Program Recommendations:    Agree with current orders (insulin  pump off). Will follow.   Thanks  Randall Bullocks, RN, BC-ADM Inpatient Diabetes Coordinator Pager [phone]  (8a-5p)

## 2024-02-28 NOTE — Progress Notes (Signed)
 PROGRESS NOTE    Melinda Stone  FMW:[id number] DOB: [birth year] DOA: 02/27/2024 PCP: [name], NP   Brief Narrative:  Melinda Stone is a 43 y.o. 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  She has since had multiple PRBC transfusions and has been receiving IV iron  and EPO.  Other comorbidities include hypertension, migraine headaches, polyneuropathy. She presents to the emergency room after being referred by primary hematologist on account of low hemoglobin count and hyperkalemia.  She admits to exertional dyspnea but denies any orthopnea, chest pain.  Assessment & Plan:   Principal Problem:   Hyperkalemia  Acute on chronic anemia.  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  - Blood pressure monitoring per unit protocol.    History of recurrent headaches.  - Most likely attributed to migraine-like headaches.  - Not in any acute exacerbation at this time.  - Patient will continue with home medications.  DVT prophylaxis: SCDs Start: 02/27/24 1947   Code Status:   Code Status: Full Code  Family Communication: None present  Status is: Inpatient  Dispo: The patient is from: Home              Anticipated d/c is to: Home              Anticipated d/c date is: 24 to 48 hours              Patient currently not medically stable for discharge  Consultants:  Heme-onc  Procedures:  None  Antimicrobials:  None indicated  Subjective: No acute issues or events overnight denies nausea vomiting diarrhea constipation headache fevers chills chest pain or any recent bleeding other than menses as outlined above  Objective: Vitals:   02/27/24 2100 02/27/24 2115 02/28/24 0130 02/28/24 0645  BP: 135/61 (!) 151/63 117/63 138/64  Pulse: (!) 114 (!) 114 87 83  Resp: 19 18 19 19   Temp:   98.4 F (36.9 C) 98.7 F (37.1 C)  TempSrc:   Oral Oral  SpO2: 100% 100% 100% 100%   No intake or output data in the 24 hours ending 02/28/24 0740 There were no vitals filed for this visit.  Examination:  General:  Pleasantly resting in bed, No acute distress. HEENT:  Normocephalic atraumatic.  Sclerae nonicteric, noninjected.  Extraocular movements intact bilaterally. Neck:  Without mass or deformity.  Trachea is midline. Lungs:  Clear to auscultate bilaterally without rhonchi, wheeze, or rales. Heart:  Regular rate and rhythm.  Without murmurs, rubs, or gallops. Abdomen:  Soft,  nontender, nondistended.  Without guarding or rebound. Extremities: Without cyanosis, clubbing, edema, or obvious deformity. Skin:  Warm and dry, no erythema.   Data Reviewed: I have personally reviewed following labs and imaging studies  CBC: Recent Labs  Lab 02/27/24 0820 02/27/24 0934 02/28/24 0402  WBC 5.8 5.9 6.4  NEUTROABS 3.6 3.9  4.6  HGB 7.2* 7.5* 6.5*  HCT 21.6* 22.4* 20.0*  MCV 84.4 84.8 87.0  PLT 164 170 162   Basic Metabolic Panel: Recent Labs  Lab 02/27/24 0934 02/27/24 1427 02/28/24 0402  NA 137 136 138  K 6.6* 6.4* 4.9  CL 115* 115* 116*  CO2 15* 16* 18*  GLUCOSE 195* 226* 152*  BUN 16 17 15   CREATININE 1.22* 1.15* 0.98  CALCIUM  8.4* 8.3* 7.5*   GFR: Estimated Creatinine Clearance: 94.7 mL/min (by C-G formula based on SCr of 0.98 mg/dL). Liver Function Tests: Recent Labs  Lab 02/27/24 0934 02/27/24 1427  AST 13* 14*  ALT 6 8  ALKPHOS 103 88  BILITOT 0.6 0.9  PROT 6.1* 6.1*  ALBUMIN 3.7 3.3*   No results for input(s): LIPASE, AMYLASE in the last 168 hours. No results for input(s): AMMONIA in the last 168 hours. Coagulation Profile: No results for input(s): INR, PROTIME in the last 168 hours. Cardiac Enzymes: No results for input(s): CKTOTAL, CKMB, CKMBINDEX, TROPONINI in the last 168 hours. BNP (last 3 results) Recent Labs    07/16/23 1040  PROBNP 1,695*   HbA1C: No results for input(s): HGBA1C in the last 72 hours. CBG: Recent Labs  Lab 02/27/24 1736 02/27/24 2208 02/28/24 0235  GLUCAP 244* 251* 127*   Lipid Profile: No results for input(s): CHOL, HDL, LDLCALC, TRIG, CHOLHDL, LDLDIRECT in the last 72 hours. Thyroid  Function Tests: No results for input(s): TSH, T4TOTAL, FREET4, T3FREE, THYROIDAB in the last 72 hours. Anemia Panel: Recent Labs    02/27/24 0934 02/27/24 0935  FERRITIN  --  251  TIBC 290  --   IRON  76  --   RETICCTPCT  --  14.8*   Sepsis Labs: No results for input(s): PROCALCITON, LATICACIDVEN in the last 168 hours.  Recent Results (from the past 240 hours)  Resp panel by RT-PCR (RSV, Flu A&B, Covid) Anterior Nasal Swab     Status: None   Collection Time: 02/27/24  8:44 PM   Specimen: Anterior Nasal Swab  Result Value Ref Range Status   SARS Coronavirus 2 by RT PCR NEGATIVE NEGATIVE Final    Influenza A by PCR NEGATIVE NEGATIVE Final   Influenza B by PCR NEGATIVE NEGATIVE Final    Comment: (NOTE) The Xpert Xpress SARS-CoV-2/FLU/RSV plus assay is intended as an aid in the diagnosis of influenza from Nasopharyngeal swab specimens and should not be used as a sole basis for treatment. Nasal washings and aspirates are unacceptable for Xpert Xpress SARS-CoV-2/FLU/RSV testing.  Fact Sheet for Patients: bloggercourse.com  Fact Sheet for Healthcare Providers: seriousbroker.it  This test is not yet approved or cleared by the [place]  FDA and has been authorized for detection and/or diagnosis of SARS-CoV-2 by FDA under an Emergency Use Authorization (EUA). This EUA will remain in effect (meaning this test can be used) for the duration of the COVID-19 declaration under Section 564(b)(1) of the Act, 21 U.S.C. section 360bbb-3(b)(1), unless the authorization is terminated or revoked.     Resp Syncytial Virus by PCR NEGATIVE NEGATIVE Final    Comment: (NOTE) Fact Sheet for Patients: bloggercourse.com  Fact Sheet for Healthcare Providers: seriousbroker.it  This  test is not yet approved or cleared by the [place]  FDA and has been authorized for detection and/or diagnosis of SARS-CoV-2 by FDA under an Emergency Use Authorization (EUA). This EUA will remain in effect (meaning this test can be used) for the duration of the COVID-19 declaration under Section 564(b)(1) of the Act, 21 U.S.C. section 360bbb-3(b)(1), unless the authorization is terminated or revoked.  Performed at [facility], [address], [place], [place] [address]      Radiology Studies: No results found.  Scheduled Meds:  sodium chloride    Intravenous Once   amitriptyline   10 mg Oral QHS   cyanocobalamin   2,500 mcg Oral Daily   ferrous sulfate   325 mg Oral Q breakfast   fluticasone   2 spray  Each Nare Daily   gabapentin   800 mg Oral TID   insulin  aspart  0-20 Units Subcutaneous TID WC   insulin  aspart  10 Units Subcutaneous TID WC   insulin  glargine  50 Units Subcutaneous Daily   metoprolol  tartrate  25 mg Oral BID   pantoprazole  (PROTONIX ) IV  40 mg Intravenous Q12H   predniSONE   60 mg Oral Q breakfast   Continuous Infusions:  sodium bicarbonate  150 mEq in sterile water  1,150 mL infusion 75 mL/hr at 02/27/24 2043     LOS: 1 day   Time spent:  Elsie JAYSON [place], DO [facility]  If 7PM-7AM, please contact night-coverage [email or web address]  02/28/2024, 7:40 AM

## 2024-02-28 NOTE — Telephone Encounter (Signed)
 I called patient to advise her of upcoming appointments for 03/02/2024 for lab and 1 unit of blood per staff message on 02/28/2024. Patient is currently admitted at [facility] hospital. Will advise if not discharged before scheduled appointments.

## 2024-02-28 NOTE — Progress Notes (Signed)
 Overnight cross coverage  Informed by RN regarding critical hemoglobin.  Hemoglobin is 6.5 on morning labs today and was previously 7.5 yesterday.  Patient is hemodynamically stable and no overt bleeding.  Type and screen, 1 unit PRBCs ordered after obtaining consent from the patient.  Follow-up posttransfusion H&H and continue to transfuse if hemoglobin is less than 7.

## 2024-02-28 NOTE — ED Notes (Signed)
Pt ambulating back and forth to the bathroom without difficulty.

## 2024-02-28 NOTE — Progress Notes (Deleted)
 Deleted

## 2024-02-28 NOTE — Progress Notes (Signed)
 Melinda Stone   DOB:[birth year]   FM#:[id number]      ASSESSMENT & PLAN:  Melinda Stone is a 43 year old female patient admitted on 02/27/2024 due to hyperkalemia.  Hematologic history is significant for hemolytic anemia and menorrhagia.  Heme-onc following closely.   Hyperkalemia - Patient seen in outpatient hematology office 02/27/2024 and noted to have elevated potassium 6.6. - Decreased to 4.9, however repeat K+ this afternoon shows increase to 6.0.  Repeat BMP in process - Hospitalist managing.   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  Autoimmune workup suggest nonclassic etiology. -- Continue oral prednisone  as ordered.  PPI prophylaxis. - Recommend PRBC transfusion for hemoglobin <7.0 - Continue to monitor CBC with differential.  Monitor CMP, retake, LDH and haptoglobin. -- Heme-Onc/Dr. Loretha following and will see patient in outpatient clinic next week and will further evaluate cause of hemolysis.    Iron  deficiency anemia Menorrhagia - Patient states that she has had very heavy menstrual bleeding.  Reports that it has now stopped for this month. - Status post endometrial ablation 09/2023 - Okay to continue oral iron  - Will monitor ferritin and iron  levels outpatient   Diabetes, type II - Likely exacerbated by steroid therapy - Continue to monitor blood sugar levels and administer insulin  per protocol.   History of CKD -Improving - Avoid nephrotoxic agents - Continue to monitor renal function        Code Status Full   Subjective:  Patient seen awake and alert sitting up in bed with IV fluids infusing well. Denies chest pain, headaches, dizziness or lightheadedness. No acute GI issues. No acute distress is noted.   Objective:   Intake/Output Summary (Last 24 hours) at 02/28/2024 1502 Last data filed at 02/28/2024 1400 Gross per 24 hour  Intake 118 ml  Output --  Net 118  ml     PHYSICAL EXAMINATION: ECOG PERFORMANCE STATUS: 1 - Symptomatic but completely ambulatory  Vitals:   02/28/24 1155 02/28/24 1432  BP: (!) 159/77 (!) 149/75  Pulse: 83 85  Resp:  17  Temp: 99 F (37.2 C) 98.5 F (36.9 C)  SpO2: 100% 100%   There were no vitals filed for this visit.  GENERAL: alert, no distress and comfortable SKIN: skin color, texture, turgor are normal, no rashes or significant lesions EYES: normal, conjunctiva are pink and non-injected, sclera clear OROPHARYNX: no exudate, no erythema and lips, buccal mucosa, and tongue normal  NECK: supple, thyroid  normal size, non-tender, without nodularity LYMPH: no palpable lymphadenopathy in the cervical, axillary or inguinal LUNGS: clear to auscultation and percussion with normal breathing effort HEART: regular rate & rhythm and no murmurs and no lower extremity edema ABDOMEN: abdomen soft, non-tender and normal bowel sounds MUSCULOSKELETAL: no cyanosis of digits and no clubbing  PSYCH: alert & oriented x 3 with fluent speech NEURO: no focal motor/sensory deficits   All questions were answered. The patient knows to call the clinic with any problems, questions or concerns.   The total time spent in the appointment was 40 minutes encounter with patient including review of chart and various tests results, discussions about plan of care and coordination of care plan  Olam JINNY Brunner, NP 02/28/2024 3:02 PM    Labs Reviewed:  Lab Results  Component Value Date   WBC 6.4 02/28/2024   HGB 6.5 (LL) 02/28/2024   HCT 20.0 (L) 02/28/2024   MCV 87.0 02/28/2024   PLT 162 02/28/2024  Recent Labs    09/06/23 1029 09/27/23 1106 10/01/23 0754 10/07/23 1050 10/10/23 0032 01/24/24 1551 02/27/24 0934 02/27/24 1427 02/28/24 0402 02/28/24 1227  NA  --   --    < >  --    < > 134* 137 136 138  --   K  --   --    < >  --    < > 5.7* 6.6* 6.4* 4.9 6.0*  CL  --   --    < >  --    < > 112* 115* 115* 116*  --   CO2  --    --    < >  --    < > 18* 15* 16* 18*  --   GLUCOSE  --   --    < >  --    < > 266* 195* 226* 152*  --   BUN  --   --    < >  --    < > 15 16 17 15   --   CREATININE  --   --    < >  --    < > 0.98 1.22* 1.15* 0.98  --   CALCIUM   --   --    < >  --    < > 8.6* 8.4* 8.3* 7.5*  --   GFRNONAA  --   --    < >  --    < > >60 57* >60 >60  --   PROT 6.1* 5.9*  --   --    < > 6.1* 6.1* 6.1*  --   --   ALBUMIN 3.3* 2.9*  --   --    < > 3.7 3.7 3.3*  --   --   AST 11* 15  --   --    < > <10* 13* 14*  --   --   ALT 8 16  --   --    < > 5 6 8   --   --   ALKPHOS 113 105  --   --    < > 120 103 88  --   --   BILITOT 1.3* 0.9  --  0.6   < > 0.8 0.6 0.9  --   --   BILIDIR 0.1 0.1  --  <0.1  --   --   --   --   --   --   IBILI 1.2* 0.8  --  NOT CALCULATED  --   --   --   --   --   --    < > = values in this interval not displayed.    Studies Reviewed:   No results found.

## 2024-02-29 ENCOUNTER — Inpatient Hospital Stay

## 2024-02-29 ENCOUNTER — Other Ambulatory Visit (HOSPITAL_COMMUNITY): Payer: Self-pay

## 2024-02-29 ENCOUNTER — Other Ambulatory Visit: Payer: Self-pay | Admitting: Hematology and Oncology

## 2024-02-29 DIAGNOSIS — I129 Hypertensive chronic kidney disease with stage 1 through stage 4 chronic kidney disease, or unspecified chronic kidney disease: Secondary | ICD-10-CM

## 2024-02-29 DIAGNOSIS — N184 Chronic kidney disease, stage 4 (severe): Secondary | ICD-10-CM

## 2024-02-29 DIAGNOSIS — D649 Anemia, unspecified: Secondary | ICD-10-CM

## 2024-02-29 DIAGNOSIS — E875 Hyperkalemia: Secondary | ICD-10-CM | POA: Diagnosis not present

## 2024-02-29 LAB — TYPE AND SCREEN
ABO/RH(D): A POS
Antibody Screen: NEGATIVE
Unit division: 0

## 2024-02-29 LAB — CBC
HCT: 29.4 % — ABNORMAL LOW (ref 36.0–46.0)
Hemoglobin: 10.1 g/dL — ABNORMAL LOW (ref 12.0–15.0)
MCH: 28.7 pg (ref 26.0–34.0)
MCHC: 34.4 g/dL (ref 30.0–36.0)
MCV: 83.5 fL (ref 80.0–100.0)
Platelets: 205 K/uL (ref 150–400)
RBC: 3.52 MIL/uL — ABNORMAL LOW (ref 3.87–5.11)
RDW: 18.9 % — ABNORMAL HIGH (ref 11.5–15.5)
WBC: 11.2 K/uL — ABNORMAL HIGH (ref 4.0–10.5)
nRBC: 0.2 % (ref 0.0–0.2)

## 2024-02-29 LAB — BASIC METABOLIC PANEL WITH GFR
Anion gap: 12 (ref 5–15)
Anion gap: 8 (ref 5–15)
BUN: 14 mg/dL (ref 6–20)
BUN: 16 mg/dL (ref 6–20)
CO2: 17 mmol/L — ABNORMAL LOW (ref 22–32)
CO2: 19 mmol/L — ABNORMAL LOW (ref 22–32)
Calcium: 8.7 mg/dL — ABNORMAL LOW (ref 8.9–10.3)
Calcium: 9.4 mg/dL (ref 8.9–10.3)
Chloride: 107 mmol/L (ref 98–111)
Chloride: 109 mmol/L (ref 98–111)
Creatinine, Ser: 1.02 mg/dL — ABNORMAL HIGH (ref 0.44–1.00)
Creatinine, Ser: 1.08 mg/dL — ABNORMAL HIGH (ref 0.44–1.00)
GFR, Estimated: >60 mL/min (ref >60)
GFR, Estimated: >60 mL/min (ref >60)
Glucose, Bld: 207 mg/dL — ABNORMAL HIGH (ref 70–99)
Glucose, Bld: 216 mg/dL — ABNORMAL HIGH (ref 70–99)
Potassium: 4.8 mmol/L (ref 3.5–5.1)
Potassium: 5.6 mmol/L — ABNORMAL HIGH (ref 3.5–5.1)
Sodium: 136 mmol/L (ref 135–145)
Sodium: 136 mmol/L (ref 135–145)

## 2024-02-29 LAB — GLUCOSE, CAPILLARY
Glucose-Capillary: 137 mg/dL — ABNORMAL HIGH (ref 70–99)
Glucose-Capillary: 176 mg/dL — ABNORMAL HIGH (ref 70–99)
Glucose-Capillary: 190 mg/dL — ABNORMAL HIGH (ref 70–99)
Glucose-Capillary: 215 mg/dL — ABNORMAL HIGH (ref 70–99)

## 2024-02-29 LAB — BPAM RBC
Blood Product Expiration Date: 202512302359
ISSUE DATE / TIME: 202512121439
Unit Type and Rh: 6200

## 2024-02-29 LAB — IGG, IGA, IGM
IgA: 250 mg/dL (ref 87–352)
IgG (Immunoglobin G), Serum: 942 mg/dL (ref 586–1602)
IgM (Immunoglobulin M), Srm: 76 mg/dL (ref 26–217)

## 2024-02-29 MED ORDER — INSULIN LISPRO 100 UNIT/ML IJ SOLN
INTRAMUSCULAR | 3 refills | Status: AC
  Filled 2024-02-29: qty 10, 30d supply, fill #0

## 2024-02-29 MED ORDER — PREDNISONE 10 MG PO TABS
ORAL_TABLET | ORAL | 0 refills | Status: DC
  Filled 2024-02-29: qty 60, 20d supply, fill #0

## 2024-02-29 MED ORDER — CYANOCOBALAMIN 2500 MCG PO TABS
2500.0000 ug | ORAL_TABLET | Freq: Every day | ORAL | 0 refills | Status: AC
  Filled 2024-02-29: qty 90, fill #0

## 2024-02-29 MED ORDER — OMNIPOD 5 G7 PODS (GEN 5) MISC: 1.6000 [IU]/h | 0 refills | Status: AC

## 2024-02-29 MED ORDER — FERROUS SULFATE 325 (65 FE) MG PO TABS: 325.0000 mg | ORAL_TABLET | Freq: Every day | ORAL | 0 refills | Status: AC

## 2024-02-29 MED ORDER — FLUTICASONE PROPIONATE 50 MCG/ACT NA SUSP: 2.0000 | Freq: Every day | NASAL | 0 refills | Status: DC

## 2024-02-29 MED ADMIN — Gabapentin Cap 400 MG: 800 mg | ORAL | NDC 69097081507

## 2024-02-29 NOTE — Progress Notes (Signed)
° ° ° °  Patient Name: Melinda Stone           DOB: Nov 29, 1980  MRN: [id number]      Admission Date: 02/27/2024  Attending Provider: Lue Elsie BROCKS, MD  Primary Diagnosis: Hyperkalemia   Level of care: Telemetry   OVERNIGHT EVENT  Notified of critical lab value.   Potassium 6.0--> 6.9.  Patient was admitted with hyperkalemia. Currently on IV bicarb gtt.  Plan: Given rise in potassium level, orders placed for EKG, calcium  gluconate, D50, IV insulin  push, Lokelma .  Monitor K level   Addendum: Potassium 4.8 this morning.    Melinda Bensen, DNP, ACNPC- [facility]

## 2024-02-29 NOTE — Progress Notes (Signed)
 Labs ordered for 12/19

## 2024-02-29 NOTE — Progress Notes (Signed)
 [facility] CONSULT NOTE  Patient Care Team: [name], NP as PCP - General (Nurse Practitioner) Michele Richardson, DO as PCP - Cardiology (Cardiology) Babara Call, MD as Consulting Physician (Oncology)  CHIEF COMPLAINTS/PURPOSE OF CONSULTATION:  Hemolytic anemia  ASSESSMENT & PLAN:  No problem-specific Assessment & Plan notes found for this encounter.  Orders Placed This Encounter  Procedures   Resp panel by RT-PCR (RSV, Flu A&B, Covid) Anterior Nasal Swab    Standing Status:   Standing    Number of Occurrences:   1   Comprehensive metabolic panel    Standing Status:   Standing    Number of Occurrences:   1   Basic metabolic panel    Standing Status:   Standing    Number of Occurrences:   1   CBC with Differential/Platelet    Standing Status:   Standing    Number of Occurrences:   1   Potassium    Standing Status:   Standing    Number of Occurrences:   1   Glucose, capillary    Standing Status:   Standing    Number of Occurrences:   1   Basic metabolic panel with GFR    Standing Status:   Standing    Number of Occurrences:   1   CBC    Standing Status:   Standing    Number of Occurrences:   1   Glucose, capillary    Standing Status:   Standing    Number of Occurrences:   1   Glucose, capillary    Standing Status:   Standing    Number of Occurrences:   1   CBC    Standing Status:   Standing    Number of Occurrences:   5   Glucose, capillary    Standing Status:   Standing    Number of Occurrences:   1   Glucose, capillary    Standing Status:   Standing    Number of Occurrences:   1   Glucose, capillary    Standing Status:   Standing    Number of Occurrences:   1   Basic metabolic panel with GFR    Standing Status:   Standing    Number of Occurrences:   1   Glucose, capillary    Standing Status:   Standing    Number of Occurrences:   1   Diet Heart Room service appropriate? Yes; Fluid consistency: Thin    Standing Status:   Standing    Number  of Occurrences:   1    Room service appropriate?:   Yes    Fluid consistency::   Thin   Initiate Carrier Fluid Protocol    Standing Status:   Standing    Number of Occurrences:   1   Vital signs    Standing Status:   Standing    Number of Occurrences:   1   Notify physician (specify)    Standing Status:   Standing    Number of Occurrences:   20    Notify Physician:   for pulse less than 55 or greater than 120    Notify Physician:   for respiratory rate less than 12 or greater than 25    Notify Physician:   for temperature greater than 100.5 F    Notify Physician:   for urinary output less than 30 mL/hr for four hours    Notify Physician:   for systolic BP  less than 90 or greater than 160, diastolic BP less than 60 or greater than 100    Notify Physician:   for new hypoxia w/ oxygen saturations < 88%   Mobility Protocol: No Restrictions    Standing Status:   Standing    Number of Occurrences:   1   Refer to Sidebar Report Mobility Protocol for Adult Inpatient    Mobility Protocol for Adult Inpatient    Standing Status:   Standing    Number of Occurrences:   1   Initiate Adult Central Line Maintenance and Catheter Clearance Protocol for patients with central line (CVC, PICC, Port, Hemodialysis, Trialysis)    Standing Status:   Standing    Number of Occurrences:   1   Initiate CHG Protocol for patients in ICU/SD or any patient with a central line or foley catheter    Standing Status:   Standing    Number of Occurrences:   1   Do not place and if present remove PureWick    Standing Status:   Standing    Number of Occurrences:   1   Initiate Oral Care Protocol    Standing Status:   Standing    Number of Occurrences:   1   Initiate Carrier Fluid Protocol    Standing Status:   Standing    Number of Occurrences:   1   RN may order General Admission PRN Orders utilizing General Admission PRN medications (through manage orders) for the following patient needs: allergy symptoms  (Claritin ), cold sores (Carmex), cough (Robitussin DM), eye irritation (Liquifilm Tears), hemorrhoids (Tucks), indigestion (Maalox), minor skin irritation (Hydrocortisone Cream), muscle pain (Ben Gay), nose irritation (saline nasal spray) and sore throat (Chloraseptic spray).    Standing Status:   Standing    Number of Occurrences:   [phone]   SCDs    Standing Status:   Standing    Number of Occurrences:   1    Laterality:   Bilateral   Cardiac Monitoring Continuous x 24 hours Indications for use: Other; other indications for use: hYPERKALEMIA    Standing Status:   Standing    Number of Occurrences:   1    Indications for use::   Other    other indications for use::   hYPERKALEMIA   Apply Diabetes Mellitus Care Plan    Standing Status:   Standing    Number of Occurrences:   1   STAT CBG when hypoglycemia is suspected. If treated, recheck every 15 minutes after each treatment until CBG >/= 70 mg/dl    Standing Status:   Standing    Number of Occurrences:   1   Refer to Hypoglycemia Protocol Sidebar Report for treatment of CBG < 70 mg/dl    Standing Status:   Standing    Number of Occurrences:   1   No HS correction Insulin     Standing Status:   Standing    Number of Occurrences:   1   Informed Consent Details: Physician/Practitioner Attestation; Transcribe to consent form and obtain patient signature    Standing Status:   Standing    Number of Occurrences:   1    Physician/Practitioner attestation of informed consent for blood and or blood product transfusion:   I, the physician/practitioner, attest that I have discussed with the patient the benefits, risks, side effects, alternatives, likelihood of achieving goals and potential problems during recovery for the procedure that I have provided informed consent.    Product(s):  All Product(s)   H & H post transfustion-  RN to place lab order with appropriate draw time    Standing Status:   Standing    Number of Occurrences:   1   Cardiac  Monitoring Continuous x 24 hours Indications for use: Other; other indications for use: hYPERKALEMIA    Standing Status:   Standing    Number of Occurrences:   1    Indications for use::   Other    other indications for use::   hYPERKALEMIA   Full code    Standing Status:   Standing    Number of Occurrences:   1    By::   Consent: discussion documented in EHR   Consult for Unassigned Medical Admission    Standing Status:   Standing    Number of Occurrences:   1    Place call to::   on call for unassigned    Reason for Consult:   Admit   Consult to hematology Consult Timeframe: ROUTINE - requires response within 24 hours; Reason for Consult? eval for hemolytic anemia    Standing Status:   Standing    Number of Occurrences:   1    Consult Timeframe:   ROUTINE - requires response within 24 hours    Reason for Consult?:   eval for hemolytic anemia   Oxygen therapy Mode or (Route): Nasal cannula; Liters Per Minute: 2; Keep O2 saturation between: greater than 92 %    Standing Status:   Standing    Number of Occurrences:   1    Mode or (Route):   Nasal cannula    Liters Per Minute:   2    Keep O2 saturation between:   greater than 92 %   CBG monitoring, ED    Standing Status:   Standing    Number of Occurrences:   1   CBG monitoring, ED    Standing Status:   Standing    Number of Occurrences:   1   CBG monitoring, ED    Standing Status:   Standing    Number of Occurrences:   1   CBG monitoring, ED    Standing Status:   Standing    Number of Occurrences:   1   ED EKG    Standing Status:   Standing    Number of Occurrences:   1    Reason for Exam:   Chest Pain   EKG    Standing Status:   Standing    Number of Occurrences:   1   EKG 12-Lead    Standing Status:   Standing    Number of Occurrences:   1   EKG 12-Lead    Standing Status:   Standing    Number of Occurrences:   1   Type and screen [facility]    [facility]     Standing Status:    Standing    Number of Occurrences:   1   Prepare RBC (crossmatch)    Standing Status:   Standing    Number of Occurrences:   1    # of Units:   1 unit    Transfusion Indications:   Hemoglobin < 7 gm/dL and symptomatic    Number of Units to Keep Ahead:   NO units ahead   Admit to Inpatient (patient's expected length of stay will be greater than 2 midnights or inpatient only procedure)    Standing  Status:   Standing    Number of Occurrences:   1    Hospital Area:   [facility] [[id number]]    Level of Care:   Telemetry [5]    May admit patient to [facility] or [facility] if equivalent level of care is available::   Yes    Diagnosis:   Hyperkalemia [827805]    Admitting Physician:   MARCA MAUDE POUR [[id number]]    Attending Physician:   MARCA MAUDE POUR [[id number]]    Certification::   I certify this patient will need inpatient services for at least 2 midnights    Expected Medical Readiness:   02/29/2024   This is a 43 year old female patient who was previously seen by Dr.Yu for the past several years and has been referred to us  for second opinion given ongoing hemolytic anemia Assessment & Plan Acute on Chronic hemolytic anemia, she has elevated retic count, normal bili, low haptoglobin Negative Coombs, PNH, and autoimmune workup suggest non-classic etiology.  On her most recent clinic visit her labs show a drop in Hb, hyperkalemia, elevated reticulocyte count, hence we recommended ED visit for hyperkalemia management and PRBC. She is responding very well to prednisone  and PRBC Today she appears better, no concerns on exam She is aware of prednisone  taper and will see me on Friday, scheduled. We will investigate further about this hemolytic anemia outpt. Ok to DC home if appropriate by primary team.   Weight HISTORY OF PRESENTING ILLNESS:  Melinda Stone 43 y.o. female is here because of hemolytic anemia  This is followed by hematology over the past several  years patient has been extensively evaluated for underlying hemolytic anemia. Very complex history, multiple hospitalization at times for blood loss anemia and some times for possible hemolysis. No evidence of nutritional deficiency She did have episodes of hemolysis in the past but not classic hemolysis and required blood transfusion, DAT neg, no cold agglutinin. She responded to steroids however she didn't tolerate them well, hence discontinued. Previous bone marrow biopsy showed Slightly hypercellular bone marrow with erythroid hyperplasia, No viral cytopathic changes or significant dyspoiesis. Suspect secondary process. Normal cytogenetics, normal myeloid NGS  MPN testing negative. CT chest abdomen pelvis with no lymphoproliferative disease. She says she wanted a second opinion since she doesn't seem to know why she is anemic.  Interval history  She appears much better compared to recent clinic visit She feels more energetic and less SOB Menstruation has stopped.  MEDICAL HISTORY:  Past Medical History:  Diagnosis Date   Anemia    Arthritis 08/17/2020   Chronic back pain    Diabetes mellitus without complication (HCC)    type 2   GERD (gastroesophageal reflux disease)    Hypertension    Migraine    Ovarian cyst    Pneumonia    Polyneuropathy     SURGICAL HISTORY: Past Surgical History:  Procedure Laterality Date   CATARACT EXTRACTION Right 11/08/2023   CESAREAN SECTION     x4   CHOLECYSTECTOMY  2004   EYE SURGERY Left    cataract   EYE SURGERY Left 10/2023   retinal surgery x2   HYSTEROSCOPY N/A 10/01/2023   Procedure: ABLATION, ENDOMETRIUM, HYSTEROSCOPIC;  Surgeon: Alger Gong, MD;  Location: [facility] OR;  Service: Gynecology;  Laterality: N/A;   IR BONE MARROW BIOPSY & ASPIRATION  08/01/2023   LUMBAR LAMINECTOMY N/A 05/30/2020   Procedure: Lumbar five Laminectomy,  Bilateral Microdiscectomy, Left Lumbar five -Sacral one Microdiscectomy;  Surgeon:  Barbarann Oneil BROCKS, MD;   Location: [facility] OR;  Service: Orthopedics;  Laterality: N/A;   LUMBAR LAMINECTOMY Left 11/14/2020   Procedure: LEFT LUMBAR FOUR-FIVE MICRODISCECTOMY;  Surgeon: Barbarann Oneil BROCKS, MD;  Location: [facility] OR;  Service: Orthopedics;  Laterality: Left;   TUBAL LIGATION  10/19/2010    SOCIAL HISTORY: Social History   Socioeconomic History   Marital status: Single    Spouse name: Not on file   Number of children: 3   Years of education: Not on file   Highest education level: High school graduate  Occupational History    Comment: home maker  Tobacco Use   Smoking status: Every Day    Types: Cigars    Last attempt to quit: 12/23/2021    Years since quitting: 2.1    Passive exposure: Never   Smokeless tobacco: Never  Vaping Use   Vaping status: Never Used  Substance and Sexual Activity   Alcohol  use: Not Currently   Drug use: Not Currently    Comment: per patient stopped smoking marijuana Mar 2022   Sexual activity: Yes    Birth control/protection: Surgical  Other Topics Concern   Not on file  Social History Narrative   Lives with 3 children   Some coffee   Right handed   Doesn't work   Social Drivers of Health   Tobacco Use: High Risk (02/27/2024)   Patient History    Smoking Tobacco Use: Every Day    Smokeless Tobacco Use: Never    Passive Exposure: Never  Financial Resource Strain: Not on file  Food Insecurity: No Food Insecurity (02/28/2024)   Epic    Worried About Programme Researcher, Broadcasting/film/video in the Last Year: Never true    Ran Out of Food in the Last Year: Never true  Transportation Needs: No Transportation Needs (02/28/2024)   Epic    Lack of Transportation (Medical): No    Lack of Transportation (Non-Medical): No  Physical Activity: Not on file  Stress: Not on file  Social Connections: Moderately Isolated (07/31/2023)   Social Connection and Isolation Panel    Frequency of Communication with Friends and Family: Twice a week    Frequency of Social Gatherings with Friends and  Family: Twice a week    Attends Religious Services: 1 to 4 times per year    Active Member of [employer] or Organizations: No    Attends Banker Meetings: Never    Marital Status: Never married  Intimate Partner Violence: Not At Risk (02/28/2024)   Epic    Fear of Current or Ex-Partner: No    Emotionally Abused: No    Physically Abused: No    Sexually Abused: No  Depression (PHQ2-9): Low Risk (11/29/2023)   Depression (PHQ2-9)    PHQ-2 Score: 0  Alcohol  Screen: Not on file  Housing: Low Risk (02/28/2024)   Epic    Unable to Pay for Housing in the Last Year: No    Number of Times Moved in the Last Year: 0    Homeless in the Last Year: No  Utilities: Not At Risk (02/28/2024)   Epic    Threatened with loss of utilities: No  Health Literacy: Not on file    FAMILY HISTORY: Family History  Problem Relation Age of Onset   Diabetes Mother    Hypertension Mother    Stroke Mother    Healthy Sister    Hypertension Brother    Asthma Brother    Hypertension Brother    Healthy  Son    Healthy Daughter    Healthy Daughter     ALLERGIES:  is allergic to trulicity  [dulaglutide ].  MEDICATIONS:  Current Facility-Administered Medications  Medication Dose Route Frequency Provider Last Rate Last Admin   acetaminophen  (TYLENOL ) tablet 650 mg  650 mg Oral Q6H PRN Acheampong, Peter K, MD       Or   acetaminophen  (TYLENOL ) suppository 650 mg  650 mg Rectal Q6H PRN Acheampong, Peter K, MD       albuterol  (PROVENTIL ) (2.5 MG/3ML) 0.083% nebulizer solution 3 mL  3 mL Inhalation Q6H PRN Acheampong, Peter K, MD       amitriptyline  (ELAVIL ) tablet 10 mg  10 mg Oral QHS Acheampong, Peter K, MD   10 mg at 02/28/24 2108   cyanocobalamin  (VITAMIN B12) tablet 2,500 mcg  2,500 mcg Oral Daily Acheampong, Peter K, MD   2,500 mcg at 02/29/24 0906   ferrous sulfate  tablet 325 mg  325 mg Oral Q breakfast Acheampong, Peter K, MD   325 mg at 02/29/24 9093   fluticasone  (FLONASE ) 50 MCG/ACT nasal spray  2 spray  2 spray Each Nare Daily Acheampong, Maude POUR, MD       gabapentin  (NEURONTIN ) capsule 800 mg  800 mg Oral TID Acheampong, Peter K, MD   800 mg at 02/29/24 9087   hydrALAZINE  (APRESOLINE ) injection 5 mg  5 mg Intravenous Q6H PRN Acheampong, Peter K, MD       insulin  aspart (novoLOG ) injection 0-20 Units  0-20 Units Subcutaneous TID WC Acheampong, Peter K, MD   4 Units at 02/29/24 0913   insulin  aspart (novoLOG ) injection 10 Units  10 Units Subcutaneous TID WC Acheampong, Peter K, MD   10 Units at 02/29/24 0914   insulin  glargine (LANTUS ) injection 50 Units  50 Units Subcutaneous Daily Acheampong, Peter K, MD   50 Units at 02/29/24 0915   metoprolol  tartrate (LOPRESSOR ) tablet 25 mg  25 mg Oral BID Acheampong, Peter K, MD   25 mg at 02/29/24 0912   oxyCODONE -acetaminophen  (PERCOCET) 7.5-325 MG per tablet 1 tablet  1 tablet Oral Q6H PRN Acheampong, Peter K, MD   1 tablet at 02/29/24 0913   pantoprazole  (PROTONIX ) injection 40 mg  40 mg Intravenous Q12H Acheampong, Peter K, MD   40 mg at 02/29/24 9085   predniSONE  (DELTASONE ) tablet 60 mg  60 mg Oral Q breakfast Acheampong, Peter K, MD   60 mg at 02/29/24 9093   Rimegepant Sulfate TBDP 75 mg  75 mg Oral PRN Acheampong, Peter K, MD       senna-docusate (Senokot-S) tablet 1 tablet  1 tablet Oral QHS PRN Acheampong, Peter K, MD       sodium bicarbonate  150 mEq in sterile water  1,150 mL infusion   Intravenous Continuous Acheampong, Maude POUR, MD 75 mL/hr at 02/29/24 0416 New Bag at 02/29/24 0416   tiZANidine  (ZANAFLEX ) tablet 4 mg  4 mg Oral Q12H PRN Acheampong, Peter K, MD   4 mg at 02/28/24 2254     PHYSICAL EXAMINATION: ECOG PERFORMANCE STATUS: 2 - Symptomatic, <50% confined to bed  Vitals:   02/29/24 0600 02/29/24 1001  BP: (!) 155/78 (!) 141/77  Pulse: 83 82  Resp:    Temp: 99.2 F (37.3 C) 97.8 F (36.6 C)  SpO2: 98% 100%     GENERAL:alert, no distress and comfortable, obese. CTA bilaterally RRR NO abdominal tenderness BLE 1+  edema.  LABORATORY DATA:  I have reviewed the data as listed Lab Results  Component Value Date   WBC 11.2 (H) 02/28/2024   HGB 10.1 (L) 02/28/2024   HCT 29.4 (L) 02/28/2024   MCV 83.5 02/28/2024   PLT 205 02/28/2024     Chemistry      Component Value Date/Time   NA 136 02/29/2024 0416   NA 137 07/23/2023 1200   K 4.8 02/29/2024 0416   CL 109 02/29/2024 0416   CO2 19 (L) 02/29/2024 0416   BUN 14 02/29/2024 0416   BUN 23 07/23/2023 1200   CREATININE 1.02 (H) 02/29/2024 0416   CREATININE 1.22 (H) 02/27/2024 0934      Component Value Date/Time   CALCIUM  8.7 (L) 02/29/2024 0416   ALKPHOS 88 02/27/2024 1427   AST 14 (L) 02/27/2024 1427   AST 13 (L) 02/27/2024 0934   ALT 8 02/27/2024 1427   ALT 6 02/27/2024 0934   BILITOT 0.9 02/27/2024 1427   BILITOT 0.6 02/27/2024 0934       RADIOGRAPHIC STUDIES: I have personally reviewed the radiological images as listed and agreed with the findings in the report. No results found.   All questions were answered. The patient knows to call the clinic with any problems, questions or concerns. I spent 35 minutes in the care of this patient including H and P, review of records, counseling and coordination of care.     Amber Stalls, MD 02/29/2024 10:27 AM

## 2024-02-29 NOTE — Progress Notes (Signed)
 Lab called for critical lab value of Potassium 6.9. Lavanda Horns, NP made aware with New orders.

## 2024-02-29 NOTE — Plan of Care (Signed)
°  Problem: Education: Goal: Ability to describe self-care measures that may prevent or decrease complications (Diabetes Survival Skills Education) will improve Outcome: Progressing   Problem: Coping: Goal: Ability to adjust to condition or change in health will improve Outcome: Progressing   Problem: Education: Goal: Knowledge of General Education information will improve Description: Including pain rating scale, medication(s)/side effects and non-pharmacologic comfort measures Outcome: Progressing   Problem: Clinical Measurements: Goal: Diagnostic test results will improve Outcome: Progressing

## 2024-02-29 NOTE — Discharge Summary (Signed)
 Physician Discharge Summary  Melinda Stone FMW:[id number] DOB: 08-12-1980 DOA: 02/27/2024  PCP: [name], NP  Admit date: 02/27/2024 Discharge date: 02/29/2024  Admitted From: Home Disposition: Home  Recommendations for Outpatient Follow-up:  Follow up with PCP in 1-2 weeks Follow-up with heme-onc as discussed  Home Health: None Equipment/Devices: None  Discharge Condition: Stable CODE STATUS: Full Diet recommendation: Low-salt low-fat low-carb diet  Brief/Interim Summary: Melinda Stone is a 43 y.o. 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  She has since had multiple PRBC transfusions and has been receiving IV iron  and EPO.  Other comorbidities include hypertension, migraine headaches, polyneuropathy. She presents to the emergency room after being referred by primary hematologist on account of low hemoglobin count and hyperkalemia.  She admits to exertional dyspnea but denies any orthopnea, chest pain.  Patient admitted as above with symptomatic hyperkalemia and anemia of unclear etiology.  Patient was admitted via heme-onc office in the outpatient setting due to abnormal labs.  Patient mains generally asymptomatic at this time, tolerated transfusion quite well, unfortunately patient did have profound hyperkalemia again after transfusion, heme-onc recommending ongoing steroids and close monitoring in the outpatient setting.  Patient's labs corrected with treatment overnight, hemoglobin now 10 potassium also within normal limits, patient will need close follow-up within the next week for repeat labs to ensure no further episodes of hyperkalemia or anemia.  Patient has follow-up with heme-onc next week per our discussion, recommend following up with them for  labs if not sooner with PCP.  She is otherwise stable and agreeable for discharge home no further complaints or issues and otherwise feels quite well.  Patient's insulin  pump is to be restarted at discharge, she has been transitioned off long-acting insulin  and sliding scale insulin  and has been sent home with refills for her pump, she also has continuous glucometers at home that can be placed as soon as she arrives home.  Outpatient follow-up with PCP and/or endocrinology in regards to ongoing diabetes care was also recommended.   Discharge Diagnoses:  Principal Problem:   Hyperkalemia Active Problems:   Chronic kidney disease (CKD), stage IV (severe) (HCC)    Discharge Instructions  Discharge Instructions     Call MD for:  difficulty breathing, headache or visual disturbances   Complete by: As directed    Call MD for:  extreme fatigue   Complete by: As directed    Call MD for:  hives   Complete by: As directed    Call MD for:  persistant dizziness or light-headedness   Complete by: As directed    Call MD for:  persistant nausea and vomiting   Complete by: As directed    Call MD for:  severe uncontrolled pain   Complete by: As directed    Call MD for:  temperature >100.4   Complete by: As directed    Diet Carb Modified   Complete by: As directed    Increase activity slowly   Complete by: As directed       Allergies as of 02/29/2024       Reactions   Trulicity  [dulaglutide ] Diarrhea        Medication List     STOP taking these medications    amLODipine  5 MG tablet Commonly known as: NORVASC    Cyanocobalamin  2500 MCG Chew   insulin  lispro 100  UNIT/ML KwikPen Commonly known as: HumaLOG  KwikPen Replaced by: HumaLOG  100 UNIT/ML injection   Lantus  SoloStar 100 UNIT/ML Solostar Pen Generic drug: insulin  glargine   Vitamin B-12 2500 MCG Subl Replaced by: Cyanocobalamin  2500 MCG Tabs       TAKE these medications    acetaminophen  500 MG tablet Commonly  known as: TYLENOL  Take 1,000 mg by mouth every 6 (six) hours as needed for mild pain (pain score 1-3) or moderate pain (pain score 4-6).   acetaZOLAMIDE ER 500 MG capsule Commonly known as: DIAMOX Take 500 mg by mouth 2 (two) times daily.   amitriptyline  10 MG tablet Commonly known as: ELAVIL  Take 10 mg by mouth at bedtime.   ascorbic acid  500 MG tablet Commonly known as: VITAMIN C  Take 500 mg by mouth in the morning.   brimonidine  0.2 % ophthalmic solution Commonly known as: ALPHAGAN  Place 1 drop into the right eye 3 (three) times daily.   celecoxib  100 MG capsule Commonly known as: CELEBREX  Take 100 mg by mouth in the morning.   Cyanocobalamin  2500 MCG Tabs Take 2,500 mcg by mouth daily. Start taking on: March 01, 2024 Replaces: Vitamin B-12 2500 MCG Subl   Emgality  120 MG/ML Soaj Generic drug: Galcanezumab -gnlm Inject 120 mg into the skin every 30 (thirty) days.   ferrous sulfate  325 (65 FE) MG tablet Take 1 tablet (325 mg total) by mouth daily with breakfast.   fluticasone  50 MCG/ACT nasal spray Commonly known as: FLONASE  Place 2 sprays into both nostrils daily.   furosemide  20 MG tablet Commonly known as: LASIX  TAKE 1 TABLET(20 MG) BY MOUTH IN THE MORNING What changed: See the new instructions.   gabapentin  800 MG tablet Commonly known as: NEURONTIN  Take 800 mg by mouth 3 (three) times daily.   HumaLOG  100 UNIT/ML injection Generic drug: insulin  lispro Refill insulin  pump as directed Replaces: insulin  lispro 100 UNIT/ML KwikPen   latanoprost 0.005 % ophthalmic solution Commonly known as: XALATAN Place 1 drop into the right eye at bedtime.   losartan  25 MG tablet Commonly known as: COZAAR  Take 25 mg by mouth daily.   metoprolol  tartrate 25 MG tablet Commonly known as: LOPRESSOR  Take 1 tablet (25 mg total) by mouth 2 (two) times daily.   multivitamin with minerals Tabs tablet Take 1 tablet by mouth daily.   Nurtec 75 MG Tbdp Generic drug:  Rimegepant Sulfate Take 1 tablet (75 mg total) by mouth as needed. Take 1 tablet at onset of headache, max is 1 tablet in 24 hours.   Omnipod 5 G7 Pods (Gen 5) Misc 1.6 Units/hr by Implant route continuous.   oxyCODONE -acetaminophen  7.5-325 MG tablet Commonly known as: PERCOCET Take 1 tablet by mouth 4 (four) times daily as needed for moderate pain (pain score 4-6).   pantoprazole  40 MG tablet Commonly known as: PROTONIX  Take 1 tablet (40 mg total) by mouth daily. What changed: when to take this   prednisoLONE  acetate 1 % ophthalmic suspension Commonly known as: PRED FORTE  Place 1 drop into the right eye in the morning, at noon, and at bedtime.   predniSONE  10 MG tablet Commonly known as: DELTASONE  Take 5 tablets (50 mg total) by mouth daily for 5 days, THEN 4 tablets (40 mg total) daily for 5 days, THEN 2 tablets (20 mg total) daily for 5 days, THEN 1 tablet (10 mg total) daily for 5 days. Start taking on: February 29, 2024 What changed: See the new instructions.   tiZANidine  4 MG tablet Commonly known as:  ZANAFLEX  Take 4 mg by mouth every 12 (twelve) hours as needed for muscle spasms.   Ventolin  HFA 108 (90 Base) MCG/ACT inhaler Generic drug: albuterol  Inhale 2 puffs into the lungs every 6 (six) hours as needed for wheezing or shortness of breath.        Allergies[1]  Consultations: Heme-onc, nephrology  Procedures/Studies: No results found.   Subjective: No acute issues or events overnight denies nausea vomiting diarrhea constipation headache fevers chills chest pain shortness of breath   Discharge Exam: Vitals:   02/29/24 0600 02/29/24 1001  BP: (!) 155/78 (!) 141/77  Pulse: 83 82  Resp:    Temp: 99.2 F (37.3 C) 97.8 F (36.6 C)  SpO2: 98% 100%   Vitals:   02/28/24 2026 02/28/24 2215 02/29/24 0600 02/29/24 1001  BP: (!) 166/72 (!) 177/82 (!) 155/78 (!) 141/77  Pulse: 92 87 83 82  Resp:      Temp: 99.5 F (37.5 C) 98.8 F (37.1 C) 99.2 F (37.3  C) 97.8 F (36.6 C)  TempSrc: Oral Oral Oral Oral  SpO2: 100% 100% 98% 100%    General: Pt is alert, awake, not in acute distress Cardiovascular: RRR, S1/S2 +, no rubs, no gallops Respiratory: CTA bilaterally, no wheezing, no rhonchi Abdominal: Soft, NT, ND, bowel sounds + Extremities: no edema, no cyanosis    The results of significant diagnostics from this hospitalization (including imaging, microbiology, ancillary and laboratory) are listed below for reference.     Microbiology: Recent Results (from the past 240 hours)  Resp panel by RT-PCR (RSV, Flu A&B, Covid) Anterior Nasal Swab     Status: None   Collection Time: 02/27/24  8:44 PM   Specimen: Anterior Nasal Swab  Result Value Ref Range Status   SARS Coronavirus 2 by RT PCR NEGATIVE NEGATIVE Final   Influenza A by PCR NEGATIVE NEGATIVE Final   Influenza B by PCR NEGATIVE NEGATIVE Final    Comment: (NOTE) The Xpert Xpress SARS-CoV-2/FLU/RSV plus assay is intended as an aid in the diagnosis of influenza from Nasopharyngeal swab specimens and should not be used as a sole basis for treatment. Nasal washings and aspirates are unacceptable for Xpert Xpress SARS-CoV-2/FLU/RSV testing.  Fact Sheet for Patients: bloggercourse.com  Fact Sheet for Healthcare Providers: seriousbroker.it  This test is not yet approved or cleared by the [place]  FDA and has been authorized for detection and/or diagnosis of SARS-CoV-2 by FDA under an Emergency Use Authorization (EUA). This EUA will remain in effect (meaning this test can be used) for the duration of the COVID-19 declaration under Section 564(b)(1) of the Act, 21 U.S.C. section 360bbb-3(b)(1), unless the authorization is terminated or revoked.     Resp Syncytial Virus by PCR NEGATIVE NEGATIVE Final    Comment: (NOTE) Fact Sheet for Patients: bloggercourse.com  Fact Sheet for Healthcare  Providers: seriousbroker.it  This test is not yet approved or cleared by the [place]  FDA and has been authorized for detection and/or diagnosis of SARS-CoV-2 by FDA under an Emergency Use Authorization (EUA). This EUA will remain in effect (meaning this test can be used) for the duration of the COVID-19 declaration under Section 564(b)(1) of the Act, 21 U.S.C. section 360bbb-3(b)(1), unless the authorization is terminated or revoked.  Performed at [facility], [address], [place], [place] [address]      Labs: BNP (last 3 results) Recent Labs    04/21/23 1147 05/11/23 2028 10/28/23 1557  BNP 238.5* 1,082.1* 204.2*   Basic Metabolic Panel:  Recent Labs  Lab 02/27/24 1427 02/28/24 0402 02/28/24 1227 02/28/24 2039 02/28/24 2335 02/29/24 0416  NA 136 138  --  133* 136 136  K 6.4* 4.9 6.0* 6.9* 5.6* 4.8  CL 115* 116*  --  108 107 109  CO2 16* 18*  --  17* 17* 19*  GLUCOSE 226* 152*  --  289* 207* 216*  BUN 17 15  --  14 16 14   CREATININE 1.15* 0.98  --  1.11* 1.08* 1.02*  CALCIUM  8.3* 7.5*  --  8.4* 9.4 8.7*   Liver Function Tests: Recent Labs  Lab 02/27/24 0934 02/27/24 1427  AST 13* 14*  ALT 6 8  ALKPHOS 103 88  BILITOT 0.6 0.9  PROT 6.1* 6.1*  ALBUMIN 3.7 3.3*   No results for input(s): LIPASE, AMYLASE in the last 168 hours. No results for input(s): AMMONIA in the last 168 hours. CBC: Recent Labs  Lab 02/27/24 0820 02/27/24 0934 02/28/24 0402 02/28/24 2039 02/28/24 2335  WBC 5.8 5.9 6.4 9.0 11.2*  NEUTROABS 3.6 3.9 4.6  --   --   HGB 7.2* 7.5* 6.5* 9.3* 10.1*  HCT 21.6* 22.4* 20.0* 26.5* 29.4*  MCV 84.4 84.8 87.0 83.1 83.5  PLT 164 170 162 187 205   Cardiac Enzymes: No results for input(s): CKTOTAL, CKMB, CKMBINDEX, TROPONINI in the last 168 hours. BNP: Invalid input(s): POCBNP CBG: Recent Labs  Lab 02/28/24 2343 02/29/24 0040 02/29/24 0128 02/29/24 0751 02/29/24 1148  GLUCAP 199*  190* 215* 176* 137*   D-Dimer No results for input(s): DDIMER in the last 72 hours. Hgb A1c No results for input(s): HGBA1C in the last 72 hours. Lipid Profile No results for input(s): CHOL, HDL, LDLCALC, TRIG, CHOLHDL, LDLDIRECT in the last 72 hours. Thyroid  function studies No results for input(s): TSH, T4TOTAL, T3FREE, THYROIDAB in the last 72 hours.  Invalid input(s): FREET3 Anemia work up Recent Labs    02/27/24 0934 02/27/24 0935  FERRITIN  --  251  TIBC 290  --   IRON  76  --   RETICCTPCT  --  14.8*   Urinalysis    Component Value Date/Time   COLORURINE RED (A) 11/23/2023 0839   APPEARANCEUR (A) 11/23/2023 0839    TEST NOT REPORTED DUE TO COLOR INTERFERENCE OF URINE PIGMENT   LABSPEC 1.025 11/23/2023 0839   PHURINE 6.0 11/23/2023 0839   GLUCOSEU 250 (A) 11/23/2023 0839   HGBUR LARGE (A) 11/23/2023 0839   BILIRUBINUR NEGATIVE 11/23/2023 0839   BILIRUBINUR negative 07/06/2023 1033   KETONESUR NEGATIVE 11/23/2023 0839   PROTEINUR >300 (A) 11/23/2023 0839   UROBILINOGEN 0.2 07/06/2023 1033   NITRITE NEGATIVE 11/23/2023 0839   LEUKOCYTESUR NEGATIVE 11/23/2023 0839   Sepsis Labs Recent Labs  Lab 02/27/24 0934 02/28/24 0402 02/28/24 2039 02/28/24 2335  WBC 5.9 6.4 9.0 11.2*   Microbiology Recent Results (from the past 240 hours)  Resp panel by RT-PCR (RSV, Flu A&B, Covid) Anterior Nasal Swab     Status: None   Collection Time: 02/27/24  8:44 PM   Specimen: Anterior Nasal Swab  Result Value Ref Range Status   SARS Coronavirus 2 by RT PCR NEGATIVE NEGATIVE Final   Influenza A by PCR NEGATIVE NEGATIVE Final   Influenza B by PCR NEGATIVE NEGATIVE Final    Comment: (NOTE) The Xpert Xpress SARS-CoV-2/FLU/RSV plus assay is intended as an aid in the diagnosis of influenza from Nasopharyngeal swab specimens and should not be used as a sole basis for treatment. Nasal washings and aspirates  are unacceptable for Xpert Xpress  SARS-CoV-2/FLU/RSV testing.  Fact Sheet for Patients: bloggercourse.com  Fact Sheet for Healthcare Providers: seriousbroker.it  This test is not yet approved or cleared by the [place]  FDA and has been authorized for detection and/or diagnosis of SARS-CoV-2 by FDA under an Emergency Use Authorization (EUA). This EUA will remain in effect (meaning this test can be used) for the duration of the COVID-19 declaration under Section 564(b)(1) of the Act, 21 U.S.C. section 360bbb-3(b)(1), unless the authorization is terminated or revoked.     Resp Syncytial Virus by PCR NEGATIVE NEGATIVE Final    Comment: (NOTE) Fact Sheet for Patients: bloggercourse.com  Fact Sheet for Healthcare Providers: seriousbroker.it  This test is not yet approved or cleared by the [place]  FDA and has been authorized for detection and/or diagnosis of SARS-CoV-2 by FDA under an Emergency Use Authorization (EUA). This EUA will remain in effect (meaning this test can be used) for the duration of the COVID-19 declaration under Section 564(b)(1) of the Act, 21 U.S.C. section 360bbb-3(b)(1), unless the authorization is terminated or revoked.  Performed at [facility], [address], [place], [place] [address]      Time coordinating discharge: Over 30 minutes  SIGNED:   Elsie JAYSON Montclair, DO [facility] 02/29/2024, 5:18 PM Pager   If 7PM-7AM, please contact night-coverage [email or web address]     [1]  Allergies Allergen Reactions   Trulicity  [Dulaglutide ] Diarrhea

## 2024-03-02 ENCOUNTER — Inpatient Hospital Stay

## 2024-03-02 ENCOUNTER — Telehealth: Payer: Self-pay

## 2024-03-02 LAB — TYPE AND SCREEN
ABO/RH(D): A POS
Antibody Screen: NEGATIVE
Unit division: 0

## 2024-03-02 LAB — BPAM RBC
Blood Product Expiration Date: 202601092359
Unit Type and Rh: 6200

## 2024-03-02 NOTE — Telephone Encounter (Signed)
 Pt is agreeable to come in 03/07/24 for blood transfusion pending labs. Tentatively scheduled at 0830.

## 2024-03-03 LAB — PYRUVATE KINASE: Pyruvate Kinase (PK): 16.6 U/g{Hb} — ABNORMAL HIGH (ref 4.6–11.2)

## 2024-03-04 ENCOUNTER — Other Ambulatory Visit (HOSPITAL_COMMUNITY): Payer: Self-pay

## 2024-03-04 ENCOUNTER — Encounter: Payer: Self-pay | Admitting: Oncology

## 2024-03-04 ENCOUNTER — Telehealth: Payer: Self-pay

## 2024-03-04 NOTE — Telephone Encounter (Signed)
 Hello Prescriber!  We are in the process of submitting a prior authorization for your patient for Nurtec. We are reaching out for clinical guidance for the following information to complete the request: Plan requiring documentation of clinical benefit since starting therapy.   Thank you! Pharmacy Team

## 2024-03-05 ENCOUNTER — Other Ambulatory Visit (HOSPITAL_COMMUNITY): Payer: Self-pay

## 2024-03-05 NOTE — Telephone Encounter (Signed)
 Called and spoke to pt and she stated that she has seen reduction in frequency and intensity in migraines

## 2024-03-05 NOTE — Telephone Encounter (Signed)
 Pharmacy Patient Advocate Encounter   Received notification from CoverMyMeds that prior authorization for Nurtec is required/requested.   Insurance verification completed.   The patient is insured through [facility].   Per test claim: PA required; PA submitted to above mentioned insurance via Latent Key/confirmation #/EOC [id number] Status is pending

## 2024-03-05 NOTE — Telephone Encounter (Signed)
 Pharmacy Patient Advocate Encounter  Received notification from [employer] that Prior Authorization for Nurtec has been APPROVED from 03/05/2024 to 03/18/2025. Ran test claim, Copay is $0. This test claim was processed through [facility]- copay amounts may vary at other pharmacies due to pharmacy/plan contracts, or as the patient moves through the different stages of their insurance plan.  [id number] PA #/Case ID/Reference #: [id number]

## 2024-03-06 ENCOUNTER — Telehealth: Payer: Self-pay

## 2024-03-06 ENCOUNTER — Inpatient Hospital Stay: Admitting: Hematology and Oncology

## 2024-03-06 ENCOUNTER — Inpatient Hospital Stay

## 2024-03-06 VITALS — BP 141/84 | HR 86 | Temp 98.3°F | Resp 18 | Ht 69.0 in | Wt 232.2 lb

## 2024-03-06 DIAGNOSIS — D649 Anemia, unspecified: Secondary | ICD-10-CM | POA: Diagnosis not present

## 2024-03-06 DIAGNOSIS — E119 Type 2 diabetes mellitus without complications: Secondary | ICD-10-CM | POA: Diagnosis not present

## 2024-03-06 DIAGNOSIS — D589 Hereditary hemolytic anemia, unspecified: Secondary | ICD-10-CM | POA: Diagnosis present

## 2024-03-06 DIAGNOSIS — E875 Hyperkalemia: Secondary | ICD-10-CM | POA: Diagnosis not present

## 2024-03-06 DIAGNOSIS — F17290 Nicotine dependence, other tobacco product, uncomplicated: Secondary | ICD-10-CM | POA: Diagnosis not present

## 2024-03-06 LAB — CMP (CANCER CENTER ONLY)
ALT: 9 U/L (ref 0–44)
AST: 14 U/L — ABNORMAL LOW (ref 15–41)
Albumin: 3.7 g/dL (ref 3.5–5.0)
Alkaline Phosphatase: 82 U/L (ref 38–126)
Anion gap: 8 (ref 5–15)
BUN: 15 mg/dL (ref 6–20)
CO2: 22 mmol/L (ref 22–32)
Calcium: 8.2 mg/dL — ABNORMAL LOW (ref 8.9–10.3)
Chloride: 112 mmol/L — ABNORMAL HIGH (ref 98–111)
Creatinine: 1.08 mg/dL — ABNORMAL HIGH (ref 0.44–1.00)
GFR, Estimated: >60 mL/min
Glucose, Bld: 121 mg/dL — ABNORMAL HIGH (ref 70–99)
Potassium: 4.4 mmol/L (ref 3.5–5.1)
Sodium: 142 mmol/L (ref 135–145)
Total Bilirubin: 0.6 mg/dL (ref 0.0–1.2)
Total Protein: 6.1 g/dL — ABNORMAL LOW (ref 6.5–8.1)

## 2024-03-06 LAB — CBC WITH DIFFERENTIAL/PLATELET
Abs Immature Granulocytes: 0.05 K/uL (ref 0.00–0.07)
Basophils Absolute: 0.1 K/uL (ref 0.0–0.1)
Basophils Relative: 0 %
Eosinophils Absolute: 0.5 K/uL (ref 0.0–0.5)
Eosinophils Relative: 4 %
HCT: 29.1 % — ABNORMAL LOW (ref 36.0–46.0)
Hemoglobin: 9.6 g/dL — ABNORMAL LOW (ref 12.0–15.0)
Immature Granulocytes: 0 %
Lymphocytes Relative: 28 %
Lymphs Abs: 3.3 K/uL (ref 0.7–4.0)
MCH: 28.1 pg (ref 26.0–34.0)
MCHC: 33 g/dL (ref 30.0–36.0)
MCV: 85.1 fL (ref 80.0–100.0)
Monocytes Absolute: 0.6 K/uL (ref 0.1–1.0)
Monocytes Relative: 5 %
Neutro Abs: 7.5 K/uL (ref 1.7–7.7)
Neutrophils Relative %: 63 %
Platelets: 148 K/uL — ABNORMAL LOW (ref 150–400)
RBC: 3.42 MIL/uL — ABNORMAL LOW (ref 3.87–5.11)
RDW: 18.8 % — ABNORMAL HIGH (ref 11.5–15.5)
Smear Review: NORMAL
WBC: 12 K/uL — ABNORMAL HIGH (ref 4.0–10.5)
nRBC: 0 % (ref 0.0–0.2)

## 2024-03-06 LAB — LACTATE DEHYDROGENASE: LDH: 235 U/L (ref 105–235)

## 2024-03-06 LAB — RETICULOCYTES
Immature Retic Fract: 8.8 % (ref 2.3–15.9)
RBC.: 3.21 MIL/uL — ABNORMAL LOW (ref 3.87–5.11)
Retic Count, Absolute: 262.3 K/uL — ABNORMAL HIGH (ref 19.0–186.0)
Retic Ct Pct: 8.2 % — ABNORMAL HIGH (ref 0.4–3.1)

## 2024-03-06 LAB — SAMPLE TO BLOOD BANK

## 2024-03-06 NOTE — Progress Notes (Signed)
 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  Mild improvement in energy post-hospitalization and prednisone  taper. Decreasing reticulocyte count suggests reduced hemolysis and likely hemoglobin improvement.   - Continued prednisone  40 mg daily for one week, then decrease to 30 mg daily for one week. - Monitored for dietary triggers of hemolysis and advised to keep a record of potential triggers. - Continued pantoprazole  for gastrointestinal protection during steroid therapy. - No indication for transfusion. - Will need CBC, CMP, sample to blood bank, LDH and retic count for next visit.    Weight HISTORY OF PRESENTING ILLNESS:  Melinda Stone 43 y.o. female is here because of hemolytic anemia  This is followed by hematology over the past several years patient has been extensively evaluated for underlying hemolytic anemia. Very complex history, multiple hospitalization at times for blood loss anemia and some times for possible hemolysis. No evidence of nutritional deficiency She did have episodes of hemolysis in the past but not classic hemolysis and required blood transfusion, DAT neg, no cold agglutinin. She responded to steroids however she didn't tolerate them well, hence discontinued. Previous bone marrow biopsy showed Slightly hypercellular bone marrow with erythroid  hyperplasia, No viral cytopathic changes or significant dyspoiesis. Suspect secondary process. Normal cytogenetics, normal myeloid NGS  MPN testing negative. CT chest abdomen pelvis with no lymphoproliferative disease. She says she wanted a second opinion since she doesn't seem to know why she is anemic.  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  She reports mild improvement in energy since hospital discharge and no worsening of symptoms. She is currently on a prednisone  taper, now at 4 mg daily.  She denies bleeding, swelling, abdominal pain, or heartburn. Her menstrual cycle has ceased. She is tolerating pantoprazole  for gastrointestinal protection and maintains good blood glucose control.  She is able to ambulate in the house without difficulty and is eating well. There are no new symptoms or complications related to anemia or splenomegaly. She recalls a prior CT scan during hospitalization showing mild splenomegaly (spleen size 14 cm), which was not considered significantly abnormal.  Rest of the pertinent 10 point ROS reviewed and neg  MEDICAL HISTORY:  Past Medical History:  Diagnosis Date   Anemia    Arthritis 08/17/2020   Chronic back pain    Diabetes mellitus without complication (HCC)    type 2   GERD (gastroesophageal reflux disease)    Hypertension    Migraine    Ovarian cyst    Pneumonia    Polyneuropathy     SURGICAL HISTORY: Past Surgical History:  Procedure Laterality Date   CATARACT EXTRACTION Right 11/08/2023   CESAREAN SECTION     x4   CHOLECYSTECTOMY  2004   EYE SURGERY Left    cataract  EYE SURGERY Left 10/2023   retinal surgery x2   HYSTEROSCOPY N/A 10/01/2023   Procedure: ABLATION, ENDOMETRIUM, HYSTEROSCOPIC;  Surgeon: Alger Gong, MD;   Location: [facility] OR;  Service: Gynecology;  Laterality: N/A;   IR BONE MARROW BIOPSY & ASPIRATION  08/01/2023   LUMBAR LAMINECTOMY N/A 05/30/2020   Procedure: Lumbar five Laminectomy,  Bilateral Microdiscectomy, Left Lumbar five -Sacral one Microdiscectomy;  Surgeon: Barbarann Oneil BROCKS, MD;  Location: [facility] OR;  Service: Orthopedics;  Laterality: N/A;   LUMBAR LAMINECTOMY Left 11/14/2020   Procedure: LEFT LUMBAR FOUR-FIVE MICRODISCECTOMY;  Surgeon: Barbarann Oneil BROCKS, MD;  Location: [facility] OR;  Service: Orthopedics;  Laterality: Left;   TUBAL LIGATION  10/19/2010    SOCIAL HISTORY: Social History   Socioeconomic History   Marital status: Single    Spouse name: Not on file   Number of children: 3   Years of education: Not on file   Highest education level: High school graduate  Occupational History    Comment: home maker  Tobacco Use   Smoking status: Every Day    Types: Cigars    Last attempt to quit: 12/23/2021    Years since quitting: 2.2    Passive exposure: Never   Smokeless tobacco: Never  Vaping Use   Vaping status: Never Used  Substance and Sexual Activity   Alcohol  use: Not Currently   Drug use: Not Currently    Comment: per patient stopped smoking marijuana Mar 2022   Sexual activity: Yes    Birth control/protection: Surgical  Other Topics Concern   Not on file  Social History Narrative   Lives with 3 children   Some coffee   Right handed   Doesn't work   Social Drivers of Health   Tobacco Use: High Risk (02/27/2024)   Patient History    Smoking Tobacco Use: Every Day    Smokeless Tobacco Use: Never    Passive Exposure: Never  Financial Resource Strain: Not on file  Food Insecurity: No Food Insecurity (02/28/2024)   Epic    Worried About Programme Researcher, Broadcasting/film/video in the Last Year: Never true    Ran Out of Food in the Last Year: Never true  Transportation Needs: No Transportation Needs (02/28/2024)   Epic    Lack of Transportation (Medical): No    Lack of Transportation  (Non-Medical): No  Physical Activity: Not on file  Stress: Not on file  Social Connections: Moderately Isolated (07/31/2023)   Social Connection and Isolation Panel    Frequency of Communication with Friends and Family: Twice a week    Frequency of Social Gatherings with Friends and Family: Twice a week    Attends Religious Services: 1 to 4 times per year    Active Member of [employer] or Organizations: No    Attends Banker Meetings: Never    Marital Status: Never married  Intimate Partner Violence: Not At Risk (02/28/2024)   Epic    Fear of Current or Ex-Partner: No    Emotionally Abused: No    Physically Abused: No    Sexually Abused: No  Depression (PHQ2-9): Low Risk (11/29/2023)   Depression (PHQ2-9)    PHQ-2 Score: 0  Alcohol  Screen: Not on file  Housing: Low Risk (02/28/2024)   Epic    Unable to Pay for Housing in the Last Year: No    Number of Times Moved in the Last Year: 0    Homeless in the Last Year: No  Utilities: Not At Risk (02/28/2024)  Epic    Threatened with loss of utilities: No  Health Literacy: Not on file    FAMILY HISTORY: Family History  Problem Relation Age of Onset   Diabetes Mother    Hypertension Mother    Stroke Mother    Healthy Sister    Hypertension Brother    Asthma Brother    Hypertension Brother    Healthy Son    Healthy Daughter    Healthy Daughter     ALLERGIES:  is allergic to trulicity  [dulaglutide ].  MEDICATIONS:  Current Outpatient Medications  Medication Sig Dispense Refill   acetaminophen  (TYLENOL ) 500 MG tablet Take 1,000 mg by mouth every 6 (six) hours as needed for mild pain (pain score 1-3) or moderate pain (pain score 4-6).     acetaZOLAMIDE ER (DIAMOX) 500 MG capsule Take 500 mg by mouth 2 (two) times daily.     albuterol  (VENTOLIN  HFA) 108 (90 Base) MCG/ACT inhaler Inhale 2 puffs into the lungs every 6 (six) hours as needed for wheezing or shortness of breath. 18 g 2   amitriptyline  (ELAVIL ) 10 MG tablet  Take 10 mg by mouth at bedtime.     ascorbic acid  (VITAMIN C ) 500 MG tablet Take 500 mg by mouth in the morning.     brimonidine  (ALPHAGAN ) 0.2 % ophthalmic solution Place 1 drop into the right eye 3 (three) times daily.     celecoxib  (CELEBREX ) 100 MG capsule Take 100 mg by mouth in the morning.     Cyanocobalamin  2500 MCG TABS Take 2,500 mcg by mouth daily. 90 tablet 0   ferrous sulfate  325 (65 FE) MG tablet Take 1 tablet (325 mg total) by mouth daily with breakfast. 30 tablet 0   fluticasone  (FLONASE ) 50 MCG/ACT nasal spray Place 2 sprays into both nostrils daily. 16 g 0   furosemide  (LASIX ) 20 MG tablet TAKE 1 TABLET(20 MG) BY MOUTH IN THE MORNING (Patient taking differently: Take 20 mg by mouth 2 (two) times daily.) 90 tablet 2   gabapentin  (NEURONTIN ) 800 MG tablet Take 800 mg by mouth 3 (three) times daily.     Galcanezumab -gnlm (EMGALITY ) 120 MG/ML SOAJ Inject 120 mg into the skin every 30 (thirty) days. 1.12 mL 11   Insulin  Disposable Pump (OMNIPOD 5 G7 PODS, GEN 5,) MISC 1.6 Units/hr by Implant route continuous. 1 each 0   insulin  lispro (HUMALOG ) 100 UNIT/ML injection Refill insulin  pump as directed 10 mL 3   latanoprost (XALATAN) 0.005 % ophthalmic solution Place 1 drop into the right eye at bedtime.     losartan  (COZAAR ) 25 MG tablet Take 25 mg by mouth daily.     metoprolol  tartrate (LOPRESSOR ) 25 MG tablet Take 1 tablet (25 mg total) by mouth 2 (two) times daily. 60 tablet 0   Multiple Vitamin (MULTIVITAMIN WITH MINERALS) TABS tablet Take 1 tablet by mouth daily.     oxyCODONE -acetaminophen  (PERCOCET) 7.5-325 MG tablet Take 1 tablet by mouth 4 (four) times daily as needed for moderate pain (pain score 4-6).     pantoprazole  (PROTONIX ) 40 MG tablet Take 1 tablet (40 mg total) by mouth daily. (Patient taking differently: Take 40 mg by mouth at bedtime.) 30 tablet 0   prednisoLONE  acetate (PRED FORTE ) 1 % ophthalmic suspension Place 1 drop into the right eye in the morning, at noon, and  at bedtime.     predniSONE  (DELTASONE ) 10 MG tablet Take 5 tablets (50 mg total) by mouth daily for 5 days, THEN 4 tablets (40 mg total)  daily for 5 days, THEN 2 tablets (20 mg total) daily for 5 days, THEN 1 tablet (10 mg total) daily for 5 days. 60 tablet 0   Rimegepant Sulfate (NURTEC) 75 MG TBDP Take 1 tablet (75 mg total) by mouth as needed. Take 1 tablet at onset of headache, max is 1 tablet in 24 hours. 8 tablet 11   tiZANidine  (ZANAFLEX ) 4 MG tablet Take 4 mg by mouth every 12 (twelve) hours as needed for muscle spasms.     No current facility-administered medications for this visit.     PHYSICAL EXAMINATION: ECOG PERFORMANCE STATUS: 2 - Symptomatic, <50% confined to bed  Vitals:   03/06/24 0853  BP: (!) 141/84  Pulse: 86  Resp: 18  Temp: 98.3 F (36.8 C)  SpO2: 100%   Filed Weights   03/06/24 0853  Weight: 232 lb 3.2 oz (105.3 kg)    GENERAL:alert, no distress and comfortable, obese. LYMPH:  no palpable lymphadenopathy in the cervical, axillary  LUNGS: clear to auscultation and percussion with normal breathing effort HEART: regular rate & rhythm and no murmurs and no lower extremity edema Musculoskeletal:no cyanosis of digits and no clubbing  PSYCH: alert & oriented x 3 with fluent speech NEURO: no focal motor/sensory deficits  LABORATORY DATA:  I have reviewed the data as listed Lab Results  Component Value Date   WBC 11.2 (H) 02/28/2024   HGB 10.1 (L) 02/28/2024   HCT 29.4 (L) 02/28/2024   MCV 83.5 02/28/2024   PLT 205 02/28/2024     Chemistry      Component Value Date/Time   NA 136 02/29/2024 0416   NA 137 07/23/2023 1200   K 4.8 02/29/2024 0416   CL 109 02/29/2024 0416   CO2 19 (L) 02/29/2024 0416   BUN 14 02/29/2024 0416   BUN 23 07/23/2023 1200   CREATININE 1.02 (H) 02/29/2024 0416   CREATININE 1.22 (H) 02/27/2024 0934      Component Value Date/Time   CALCIUM  8.7 (L) 02/29/2024 0416   ALKPHOS 88 02/27/2024 1427   AST 14 (L) 02/27/2024 1427    AST 13 (L) 02/27/2024 0934   ALT 8 02/27/2024 1427   ALT 6 02/27/2024 0934   BILITOT 0.9 02/27/2024 1427   BILITOT 0.6 02/27/2024 0934       RADIOGRAPHIC STUDIES: I have personally reviewed the radiological images as listed and agreed with the findings in the report. No results found.   All questions were answered. The patient knows to call the clinic with any problems, questions or concerns. I spent 30 minutes in the care of this patient including H and P, review of records, counseling and coordination of care.     Amber Stalls, MD 03/06/2024 8:56 AM

## 2024-03-06 NOTE — Telephone Encounter (Signed)
 Hello Prescriber!  We are in the process of submitting a prior authorization for your patient for Emgality . We are reaching out for clinical guidance for the following information to complete the request: Plan requiring documentation of clinical benefit since starting therapy.   Thank you! Pharmacy Team

## 2024-03-07 ENCOUNTER — Inpatient Hospital Stay

## 2024-03-07 LAB — HAPTOGLOBIN: Haptoglobin: 14 mg/dL — ABNORMAL LOW (ref 42–296)

## 2024-03-09 NOTE — Telephone Encounter (Signed)
 Pharmacy Patient Advocate Encounter  Received notification from [employer] that Prior Authorization for Emgality  has been APPROVED from 03/09/2024 to 03/18/2025   PA #/Case ID/Reference #: [id number]

## 2024-03-09 NOTE — Telephone Encounter (Signed)
 Pharmacy Patient Advocate Encounter   Received notification from CoverMyMeds that prior authorization for Emgality  is required/requested.   Insurance verification completed.   The patient is insured through [facility], [place].   Per test claim: PA required; PA submitted to above mentioned insurance via Latent Key/confirmation #/EOC [id number] Status is pending

## 2024-03-09 NOTE — Telephone Encounter (Signed)
 Called and asked if patient has received reduction in frequency and intensity in migraines/headaches and she stated yes she is doing better on emgality 

## 2024-03-11 ENCOUNTER — Other Ambulatory Visit: Payer: Self-pay | Admitting: *Deleted

## 2024-03-11 DIAGNOSIS — D649 Anemia, unspecified: Secondary | ICD-10-CM

## 2024-03-16 ENCOUNTER — Inpatient Hospital Stay

## 2024-03-16 ENCOUNTER — Inpatient Hospital Stay (HOSPITAL_BASED_OUTPATIENT_CLINIC_OR_DEPARTMENT_OTHER): Admitting: Hematology and Oncology

## 2024-03-16 VITALS — BP 144/87 | HR 86 | Temp 98.0°F | Resp 18 | Ht 69.0 in | Wt 231.9 lb

## 2024-03-16 DIAGNOSIS — D649 Anemia, unspecified: Secondary | ICD-10-CM

## 2024-03-16 DIAGNOSIS — D589 Hereditary hemolytic anemia, unspecified: Secondary | ICD-10-CM | POA: Diagnosis not present

## 2024-03-16 LAB — CBC WITH DIFFERENTIAL (CANCER CENTER ONLY)
Abs Immature Granulocytes: 0.11 K/uL — ABNORMAL HIGH (ref 0.00–0.07)
Basophils Absolute: 0.1 K/uL (ref 0.0–0.1)
Basophils Relative: 1 %
Eosinophils Absolute: 0 K/uL (ref 0.0–0.5)
Eosinophils Relative: 0 %
HCT: 23.7 % — ABNORMAL LOW (ref 36.0–46.0)
Hemoglobin: 8.3 g/dL — ABNORMAL LOW (ref 12.0–15.0)
Immature Granulocytes: 1 %
Lymphocytes Relative: 18 %
Lymphs Abs: 2 K/uL (ref 0.7–4.0)
MCH: 28.3 pg (ref 26.0–34.0)
MCHC: 35 g/dL (ref 30.0–36.0)
MCV: 80.9 fL (ref 80.0–100.0)
Monocytes Absolute: 0.6 K/uL (ref 0.1–1.0)
Monocytes Relative: 6 %
Neutro Abs: 8.3 K/uL — ABNORMAL HIGH (ref 1.7–7.7)
Neutrophils Relative %: 74 %
Platelet Count: 132 K/uL — ABNORMAL LOW (ref 150–400)
RBC: 2.93 MIL/uL — ABNORMAL LOW (ref 3.87–5.11)
RDW: 18 % — ABNORMAL HIGH (ref 11.5–15.5)
WBC Count: 11.1 K/uL — ABNORMAL HIGH (ref 4.0–10.5)
nRBC: 0 % (ref 0.0–0.2)

## 2024-03-16 LAB — SAMPLE TO BLOOD BANK

## 2024-03-16 LAB — RETICULOCYTES
Immature Retic Fract: 6.1 % (ref 2.3–15.9)
RBC.: 2.97 MIL/uL — ABNORMAL LOW (ref 3.87–5.11)
Retic Count, Absolute: 172.6 K/uL (ref 19.0–186.0)
Retic Ct Pct: 5.8 % — ABNORMAL HIGH (ref 0.4–3.1)

## 2024-03-16 MED ORDER — PREDNISONE 10 MG PO TABS: 10.0000 mg | ORAL_TABLET | Freq: Every day | ORAL | 0 refills | Status: DC

## 2024-03-16 NOTE — Progress Notes (Signed)
 [facility] CONSULT NOTE  Patient Care Team: [name], NP as PCP - General (Nurse Practitioner) Michele Richardson, DO as PCP - Cardiology (Cardiology) Babara Call, MD as Consulting Physician (Oncology)  CHIEF COMPLAINTS/PURPOSE OF CONSULTATION:  Hemolytic anemia  ASSESSMENT & PLAN:   This is a 43 year old female patient who was previously seen by Dr.Yu for the past several years and has been referred to us  for second opinion given ongoing hemolytic anemia Assessment & Plan Acute on Chronic hemolytic anemia, she has elevated retic count, normal bili, low haptoglobin Negative Coombs, PNH, and autoimmune workup suggest non-classic etiology.  Hemoglobin stable without transfusion.  Reticulocyte count suggests hemolysis has slowed down.  - Prescribed prednisone  taper: two tablets daily for one week, then one tablet daily for one week, then discontinue. - Reviewed prior laboratory results. - Scheduled follow-up in two weeks to reassess clinical status and laboratory values. - Discussed possible referral to [facility] for further evaluation if diagnosis remains unclear or if no clinical improvement. - Assessed transfusion requirement; not indicated.  Heavy menstrual bleeding Chronic menorrhagia every 28-29 days, associated with hemoglobin drops and contributing to anemia. - Advised close follow-up for worsening symptoms or further hemoglobin decline during menses.   Weight HISTORY OF PRESENTING ILLNESS:  Melinda Stone 43 y.o. female is here because of hemolytic anemia  This is followed by hematology over the past several years patient has been extensively evaluated for underlying hemolytic anemia. Very complex history, multiple hospitalization at times for blood loss anemia and some times for possible hemolysis. No evidence of nutritional deficiency She did have episodes of hemolysis in the past but not classic hemolysis and required blood transfusion, DAT neg, no  cold agglutinin. She responded to steroids however she didn't tolerate them well, hence discontinued. Previous bone marrow biopsy showed Slightly hypercellular bone marrow with erythroid hyperplasia, No viral cytopathic changes or significant dyspoiesis. Suspect secondary process. Normal cytogenetics, normal myeloid NGS  MPN testing negative. CT chest abdomen pelvis with no lymphoproliferative disease. She says she wanted a second opinion since she doesn't seem to know why she is anemic.  Interval History  History of Present Illness  Melinda Stone is a 43 year old female with chronic non-immune mediated hemolytic anemia who presents for hematology follow-up to assess anemia control.  Her hemoglobin is 8.3 g/dL and her reticulocyte count is 5.8%. She has not required blood transfusion in the past two weeks. She had been taking prednisone  but ran out of medication yesterday; a refill was provided during this visit. Despite anemia, she reports stable energy levels and is able to ambulate around the house without difficulty.   Menstrual cycles occur regularly every 28 to 29 days, with the most recent cycle starting at the end of this week. Menses remain heavy, and she describes increased weakness and a sensation of her body 'drowning' during menstruation. There have been no significant changes in her overall health, diet, or medication regimen aside from the lapse in prednisone . She does not consume alcohol  and has not started any new medications. Rest of the pertinent 10 point ROS reviewed and neg  MEDICAL HISTORY:  Past Medical History:  Diagnosis Date   Anemia    Arthritis 08/17/2020   Chronic back pain    Diabetes mellitus without complication (HCC)    type 2   GERD (gastroesophageal reflux disease)    Hypertension    Migraine    Ovarian cyst    Pneumonia    Polyneuropathy  SURGICAL HISTORY: Past Surgical History:  Procedure Laterality Date   CATARACT EXTRACTION Right  11/08/2023   CESAREAN SECTION     x4   CHOLECYSTECTOMY  2004   EYE SURGERY Left    cataract   EYE SURGERY Left 10/2023   retinal surgery x2   HYSTEROSCOPY N/A 10/01/2023   Procedure: ABLATION, ENDOMETRIUM, HYSTEROSCOPIC;  Surgeon: Alger Gong, MD;  Location: [facility] OR;  Service: Gynecology;  Laterality: N/A;   IR BONE MARROW BIOPSY & ASPIRATION  08/01/2023   LUMBAR LAMINECTOMY N/A 05/30/2020   Procedure: Lumbar five Laminectomy,  Bilateral Microdiscectomy, Left Lumbar five -Sacral one Microdiscectomy;  Surgeon: Barbarann Oneil BROCKS, MD;  Location: [facility] OR;  Service: Orthopedics;  Laterality: N/A;   LUMBAR LAMINECTOMY Left 11/14/2020   Procedure: LEFT LUMBAR FOUR-FIVE MICRODISCECTOMY;  Surgeon: Barbarann Oneil BROCKS, MD;  Location: [facility] OR;  Service: Orthopedics;  Laterality: Left;   TUBAL LIGATION  10/19/2010    SOCIAL HISTORY: Social History   Socioeconomic History   Marital status: Single    Spouse name: Not on file   Number of children: 3   Years of education: Not on file   Highest education level: High school graduate  Occupational History    Comment: home maker  Tobacco Use   Smoking status: Every Day    Types: Cigars    Last attempt to quit: 12/23/2021    Years since quitting: 2.2    Passive exposure: Never   Smokeless tobacco: Never  Vaping Use   Vaping status: Never Used  Substance and Sexual Activity   Alcohol  use: Not Currently   Drug use: Not Currently    Comment: per patient stopped smoking marijuana Mar 2022   Sexual activity: Yes    Birth control/protection: Surgical  Other Topics Concern   Not on file  Social History Narrative   Lives with 3 children   Some coffee   Right handed   Doesn't work   Social Drivers of Health   Tobacco Use: High Risk (02/27/2024)   Patient History    Smoking Tobacco Use: Every Day    Smokeless Tobacco Use: Never    Passive Exposure: Never  Financial Resource Strain: Not on file  Food Insecurity: No Food Insecurity (02/28/2024)   Epic     Worried About Programme Researcher, Broadcasting/film/video in the Last Year: Never true    Ran Out of Food in the Last Year: Never true  Transportation Needs: No Transportation Needs (02/28/2024)   Epic    Lack of Transportation (Medical): No    Lack of Transportation (Non-Medical): No  Physical Activity: Not on file  Stress: Not on file  Social Connections: Moderately Isolated (07/31/2023)   Social Connection and Isolation Panel    Frequency of Communication with Friends and Family: Twice a week    Frequency of Social Gatherings with Friends and Family: Twice a week    Attends Religious Services: 1 to 4 times per year    Active Member of [employer] or Organizations: No    Attends Banker Meetings: Never    Marital Status: Never married  Intimate Partner Violence: Not At Risk (02/28/2024)   Epic    Fear of Current or Ex-Partner: No    Emotionally Abused: No    Physically Abused: No    Sexually Abused: No  Depression (PHQ2-9): Low Risk (11/29/2023)   Depression (PHQ2-9)    PHQ-2 Score: 0  Alcohol  Screen: Not on file  Housing: Low Risk (02/28/2024)   Epic  Unable to Pay for Housing in the Last Year: No    Number of Times Moved in the Last Year: 0    Homeless in the Last Year: No  Utilities: Not At Risk (02/28/2024)   Epic    Threatened with loss of utilities: No  Health Literacy: Not on file    FAMILY HISTORY: Family History  Problem Relation Age of Onset   Diabetes Mother    Hypertension Mother    Stroke Mother    Healthy Sister    Hypertension Brother    Asthma Brother    Hypertension Brother    Healthy Son    Healthy Daughter    Healthy Daughter     ALLERGIES:  is allergic to trulicity  [dulaglutide ].  MEDICATIONS:  Current Outpatient Medications  Medication Sig Dispense Refill   acetaminophen  (TYLENOL ) 500 MG tablet Take 1,000 mg by mouth every 6 (six) hours as needed for mild pain (pain score 1-3) or moderate pain (pain score 4-6).     acetaZOLAMIDE ER (DIAMOX) 500 MG  capsule Take 500 mg by mouth 2 (two) times daily.     albuterol  (VENTOLIN  HFA) 108 (90 Base) MCG/ACT inhaler Inhale 2 puffs into the lungs every 6 (six) hours as needed for wheezing or shortness of breath. 18 g 2   amitriptyline  (ELAVIL ) 10 MG tablet Take 10 mg by mouth at bedtime.     ascorbic acid  (VITAMIN C ) 500 MG tablet Take 500 mg by mouth in the morning.     brimonidine  (ALPHAGAN ) 0.2 % ophthalmic solution Place 1 drop into the right eye 3 (three) times daily.     celecoxib  (CELEBREX ) 100 MG capsule Take 100 mg by mouth in the morning.     Cyanocobalamin  2500 MCG TABS Take 2,500 mcg by mouth daily. 90 tablet 0   ferrous sulfate  325 (65 FE) MG tablet Take 1 tablet (325 mg total) by mouth daily with breakfast. 30 tablet 0   fluticasone  (FLONASE ) 50 MCG/ACT nasal spray Place 2 sprays into both nostrils daily. 16 g 0   furosemide  (LASIX ) 20 MG tablet TAKE 1 TABLET(20 MG) BY MOUTH IN THE MORNING 90 tablet 2   gabapentin  (NEURONTIN ) 800 MG tablet Take 800 mg by mouth 3 (three) times daily.     Galcanezumab -gnlm (EMGALITY ) 120 MG/ML SOAJ Inject 120 mg into the skin every 30 (thirty) days. 1.12 mL 11   Insulin  Disposable Pump (OMNIPOD 5 G7 PODS, GEN 5,) MISC 1.6 Units/hr by Implant route continuous. 1 each 0   insulin  lispro (HUMALOG ) 100 UNIT/ML injection Refill insulin  pump as directed 10 mL 3   latanoprost (XALATAN) 0.005 % ophthalmic solution Place 1 drop into the right eye at bedtime.     losartan  (COZAAR ) 25 MG tablet Take 25 mg by mouth daily.     metoprolol  tartrate (LOPRESSOR ) 25 MG tablet Take 1 tablet (25 mg total) by mouth 2 (two) times daily. 60 tablet 0   Multiple Vitamin (MULTIVITAMIN WITH MINERALS) TABS tablet Take 1 tablet by mouth daily.     oxyCODONE -acetaminophen  (PERCOCET) 7.5-325 MG tablet Take 1 tablet by mouth 4 (four) times daily as needed for moderate pain (pain score 4-6).     pantoprazole  (PROTONIX ) 40 MG tablet Take 1 tablet (40 mg total) by mouth daily. 30 tablet 0    prednisoLONE  acetate (PRED FORTE ) 1 % ophthalmic suspension Place 1 drop into the right eye in the morning, at noon, and at bedtime.     predniSONE  (DELTASONE ) 10 MG tablet Take  1 tablet (10 mg total) by mouth daily with breakfast. 30 tablet 0   Rimegepant Sulfate (NURTEC) 75 MG TBDP Take 1 tablet (75 mg total) by mouth as needed. Take 1 tablet at onset of headache, max is 1 tablet in 24 hours. 8 tablet 11   tiZANidine  (ZANAFLEX ) 4 MG tablet Take 4 mg by mouth every 12 (twelve) hours as needed for muscle spasms.     No current facility-administered medications for this visit.     PHYSICAL EXAMINATION: ECOG PERFORMANCE STATUS: 2 - Symptomatic, <50% confined to bed  Vitals:   03/16/24 1329  BP: (!) 144/87  Pulse: 86  Resp: 18  Temp: 98 F (36.7 C)  SpO2: 100%   Filed Weights   03/16/24 1329  Weight: 231 lb 14.4 oz (105.2 kg)    GENERAL:alert, no distress and comfortable, obese. LYMPH:  no palpable lymphadenopathy in the cervical, axillary  LUNGS: clear to auscultation and percussion with normal breathing effort HEART: regular rate & rhythm and no murmurs and no lower extremity edema Musculoskeletal:no cyanosis of digits and no clubbing  PSYCH: alert & oriented x 3 with fluent speech NEURO: no focal motor/sensory deficits  LABORATORY DATA:  I have reviewed the data as listed Lab Results  Component Value Date   WBC 11.1 (H) 03/16/2024   HGB 8.3 (L) 03/16/2024   HCT 23.7 (L) 03/16/2024   MCV 80.9 03/16/2024   PLT 132 (L) 03/16/2024     Chemistry      Component Value Date/Time   NA 142 03/06/2024 0822   NA 137 07/23/2023 1200   K 4.4 03/06/2024 0822   CL 112 (H) 03/06/2024 0822   CO2 22 03/06/2024 0822   BUN 15 03/06/2024 0822   BUN 23 07/23/2023 1200   CREATININE 1.08 (H) 03/06/2024 0822      Component Value Date/Time   CALCIUM  8.2 (L) 03/06/2024 0822   ALKPHOS 82 03/06/2024 0822   AST 14 (L) 03/06/2024 0822   ALT 9 03/06/2024 0822   BILITOT 0.6 03/06/2024 9177        RADIOGRAPHIC STUDIES: I have personally reviewed the radiological images as listed and agreed with the findings in the report. No results found.   All questions were answered. The patient knows to call the clinic with any problems, questions or concerns. I spent 30 minutes in the care of this patient including H and P, review of records, counseling and coordination of care.     Amber Stalls, MD 03/16/2024 3:44 PM

## 2024-03-17 ENCOUNTER — Telehealth: Payer: Self-pay | Admitting: Hematology and Oncology

## 2024-03-17 NOTE — Telephone Encounter (Signed)
 Left vm for pt to call back and schedule appts per 12/29 los

## 2024-03-20 ENCOUNTER — Other Ambulatory Visit: Payer: Self-pay

## 2024-03-20 ENCOUNTER — Encounter (HOSPITAL_COMMUNITY): Payer: Self-pay

## 2024-03-20 ENCOUNTER — Observation Stay (HOSPITAL_COMMUNITY): Admission: EM | Admit: 2024-03-20 | Discharge: 2024-03-22 | Disposition: A

## 2024-03-20 DIAGNOSIS — E119 Type 2 diabetes mellitus without complications: Secondary | ICD-10-CM | POA: Diagnosis not present

## 2024-03-20 DIAGNOSIS — K219 Gastro-esophageal reflux disease without esophagitis: Secondary | ICD-10-CM | POA: Insufficient documentation

## 2024-03-20 DIAGNOSIS — D500 Iron deficiency anemia secondary to blood loss (chronic): Principal | ICD-10-CM | POA: Diagnosis present

## 2024-03-20 DIAGNOSIS — Z79899 Other long term (current) drug therapy: Secondary | ICD-10-CM | POA: Diagnosis not present

## 2024-03-20 DIAGNOSIS — R079 Chest pain, unspecified: Secondary | ICD-10-CM | POA: Insufficient documentation

## 2024-03-20 DIAGNOSIS — R7989 Other specified abnormal findings of blood chemistry: Secondary | ICD-10-CM | POA: Diagnosis not present

## 2024-03-20 DIAGNOSIS — E872 Acidosis, unspecified: Secondary | ICD-10-CM | POA: Insufficient documentation

## 2024-03-20 DIAGNOSIS — M7989 Other specified soft tissue disorders: Secondary | ICD-10-CM

## 2024-03-20 DIAGNOSIS — F17290 Nicotine dependence, other tobacco product, uncomplicated: Secondary | ICD-10-CM | POA: Diagnosis not present

## 2024-03-20 DIAGNOSIS — D649 Anemia, unspecified: Secondary | ICD-10-CM

## 2024-03-20 DIAGNOSIS — R0602 Shortness of breath: Secondary | ICD-10-CM | POA: Diagnosis present

## 2024-03-20 DIAGNOSIS — D62 Acute posthemorrhagic anemia: Secondary | ICD-10-CM | POA: Insufficient documentation

## 2024-03-20 DIAGNOSIS — E875 Hyperkalemia: Secondary | ICD-10-CM | POA: Diagnosis not present

## 2024-03-20 DIAGNOSIS — Z794 Long term (current) use of insulin: Secondary | ICD-10-CM | POA: Diagnosis not present

## 2024-03-20 DIAGNOSIS — I10 Essential (primary) hypertension: Secondary | ICD-10-CM | POA: Insufficient documentation

## 2024-03-20 LAB — URINALYSIS, MICROSCOPIC (REFLEX): RBC / HPF: >50 RBC/hpf (ref 0–5)

## 2024-03-20 LAB — CBC WITH DIFFERENTIAL/PLATELET
Abs Immature Granulocytes: 0.06 K/uL (ref 0.00–0.07)
Basophils Absolute: 0.1 K/uL (ref 0.0–0.1)
Basophils Relative: 1 %
Eosinophils Absolute: 0 K/uL (ref 0.0–0.5)
Eosinophils Relative: 0 %
HCT: 20.4 % — ABNORMAL LOW (ref 36.0–46.0)
Hemoglobin: 6.9 g/dL — CL (ref 12.0–15.0)
Immature Granulocytes: 1 %
Lymphocytes Relative: 15 %
Lymphs Abs: 1.4 K/uL (ref 0.7–4.0)
MCH: 28.6 pg (ref 26.0–34.0)
MCHC: 33.8 g/dL (ref 30.0–36.0)
MCV: 84.6 fL (ref 80.0–100.0)
Monocytes Absolute: 0.4 K/uL (ref 0.1–1.0)
Monocytes Relative: 4 %
Neutro Abs: 7.4 K/uL (ref 1.7–7.7)
Neutrophils Relative %: 79 %
Platelets: 161 K/uL (ref 150–400)
RBC: 2.41 MIL/uL — ABNORMAL LOW (ref 3.87–5.11)
RDW: 18.4 % — ABNORMAL HIGH (ref 11.5–15.5)
WBC: 9.2 K/uL (ref 4.0–10.5)
nRBC: 0 % (ref 0.0–0.2)

## 2024-03-20 LAB — TROPONIN T, HIGH SENSITIVITY
Troponin T High Sensitivity: 95 ng/L — ABNORMAL HIGH (ref 0–19)
Troponin T High Sensitivity: 106 ng/L (ref 0–19)

## 2024-03-20 LAB — BASIC METABOLIC PANEL WITH GFR
Anion gap: 7 (ref 5–15)
BUN: 22 mg/dL — ABNORMAL HIGH (ref 6–20)
CO2: 17 mmol/L — ABNORMAL LOW (ref 22–32)
Calcium: 8.1 mg/dL — ABNORMAL LOW (ref 8.9–10.3)
Chloride: 114 mmol/L — ABNORMAL HIGH (ref 98–111)
Creatinine, Ser: 1.09 mg/dL — ABNORMAL HIGH (ref 0.44–1.00)
GFR, Estimated: >60 mL/min
Glucose, Bld: 194 mg/dL — ABNORMAL HIGH (ref 70–99)
Potassium: 6 mmol/L — ABNORMAL HIGH (ref 3.5–5.1)
Sodium: 138 mmol/L (ref 135–145)

## 2024-03-20 LAB — URINALYSIS, ROUTINE W REFLEX MICROSCOPIC
Specific Gravity, Urine: 1.025 (ref 1.005–1.030)
pH: 5 (ref 5.0–8.0)

## 2024-03-20 LAB — CBG MONITORING, ED
Glucose-Capillary: 235 mg/dL — ABNORMAL HIGH (ref 70–99)
Glucose-Capillary: 240 mg/dL — ABNORMAL HIGH (ref 70–99)

## 2024-03-20 LAB — PREPARE RBC (CROSSMATCH)

## 2024-03-20 MED ORDER — CALCIUM GLUCONATE-NACL 1-0.675 GM/50ML-% IV SOLN
1.0000 g | Freq: Once | INTRAVENOUS | Status: AC
  Administered 2024-03-20: 1000 mg via INTRAVENOUS
  Filled 2024-03-20: qty 50

## 2024-03-20 MED ORDER — PREDNISOLONE ACETATE 1 % OP SUSP
1.0000 [drp] | Freq: Three times a day (TID) | OPHTHALMIC | Status: DC
  Administered 2024-03-21 – 2024-03-22 (×3): 1 [drp] via OPHTHALMIC
  Filled 2024-03-20: qty 5

## 2024-03-20 MED ORDER — METOPROLOL TARTRATE 25 MG PO TABS
25.0000 mg | ORAL_TABLET | Freq: Two times a day (BID) | ORAL | Status: DC
  Administered 2024-03-20 – 2024-03-22 (×4): 25 mg via ORAL
  Filled 2024-03-20 (×4): qty 1

## 2024-03-20 MED ORDER — AMITRIPTYLINE HCL 10 MG PO TABS
10.0000 mg | ORAL_TABLET | Freq: Every day | ORAL | Status: DC
  Administered 2024-03-21: 10 mg via ORAL
  Filled 2024-03-20 (×3): qty 1

## 2024-03-20 MED ORDER — ONDANSETRON HCL 4 MG/2ML IJ SOLN: 4.0000 mg | Freq: Four times a day (QID) | INTRAMUSCULAR | Status: DC | PRN

## 2024-03-20 MED ORDER — GABAPENTIN 400 MG PO CAPS
800.0000 mg | ORAL_CAPSULE | Freq: Three times a day (TID) | ORAL | Status: DC
  Administered 2024-03-20 – 2024-03-22 (×5): 800 mg via ORAL
  Filled 2024-03-20: qty 2
  Filled 2024-03-20 (×2): qty 8
  Filled 2024-03-20 (×2): qty 2

## 2024-03-20 MED ORDER — PANTOPRAZOLE SODIUM 40 MG PO TBEC
40.0000 mg | DELAYED_RELEASE_TABLET | Freq: Every day | ORAL | Status: DC
  Administered 2024-03-20 – 2024-03-22 (×3): 40 mg via ORAL
  Filled 2024-03-20 (×3): qty 1

## 2024-03-20 MED ORDER — DEXTROSE 50 % IV SOLN
1.0000 | Freq: Once | INTRAVENOUS | Status: AC
  Administered 2024-03-20: 50 mL via INTRAVENOUS
  Filled 2024-03-20: qty 50

## 2024-03-20 MED ORDER — BUTALBITAL-APAP-CAFFEINE 50-325-40 MG PO TABS
1.0000 | ORAL_TABLET | Freq: Four times a day (QID) | ORAL | Status: AC | PRN
  Administered 2024-03-20: 1 via ORAL
  Filled 2024-03-20 (×2): qty 1

## 2024-03-20 MED ORDER — CELECOXIB 100 MG PO CAPS
100.0000 mg | ORAL_CAPSULE | Freq: Every morning | ORAL | Status: DC
  Administered 2024-03-21 – 2024-03-22 (×2): 100 mg via ORAL
  Filled 2024-03-20 (×3): qty 1

## 2024-03-20 MED ORDER — BRIMONIDINE TARTRATE 0.2 % OP SOLN
1.0000 [drp] | Freq: Three times a day (TID) | OPHTHALMIC | Status: DC
  Administered 2024-03-21 – 2024-03-22 (×3): 1 [drp] via OPHTHALMIC
  Filled 2024-03-20: qty 5

## 2024-03-20 MED ORDER — OXYCODONE-ACETAMINOPHEN 7.5-325 MG PO TABS
1.0000 | ORAL_TABLET | Freq: Four times a day (QID) | ORAL | Status: DC | PRN
  Administered 2024-03-21 – 2024-03-22 (×3): 1 via ORAL
  Filled 2024-03-20 (×3): qty 1

## 2024-03-20 MED ORDER — VITAMIN C 500 MG PO TABS
500.0000 mg | ORAL_TABLET | Freq: Every morning | ORAL | Status: DC
  Administered 2024-03-21 – 2024-03-22 (×2): 500 mg via ORAL
  Filled 2024-03-20 (×2): qty 1

## 2024-03-20 MED ORDER — SODIUM ZIRCONIUM CYCLOSILICATE 10 G PO PACK
10.0000 g | PACK | Freq: Once | ORAL | Status: AC
  Administered 2024-03-20: 10 g via ORAL
  Filled 2024-03-20: qty 1

## 2024-03-20 MED ORDER — FERROUS SULFATE 325 (65 FE) MG PO TABS
325.0000 mg | ORAL_TABLET | Freq: Every day | ORAL | Status: DC
  Administered 2024-03-21 – 2024-03-22 (×2): 325 mg via ORAL
  Filled 2024-03-20 (×2): qty 1

## 2024-03-20 MED ORDER — SODIUM BICARBONATE 8.4 % IV SOLN
50.0000 meq | Freq: Once | INTRAVENOUS | Status: AC
  Administered 2024-03-20: 50 meq via INTRAVENOUS
  Filled 2024-03-20: qty 50

## 2024-03-20 MED ORDER — SODIUM CHLORIDE 0.9% IV SOLUTION: Freq: Once | INTRAVENOUS | Status: AC

## 2024-03-20 MED ORDER — SODIUM CHLORIDE 0.9% FLUSH
3.0000 mL | Freq: Two times a day (BID) | INTRAVENOUS | Status: DC
  Administered 2024-03-20 – 2024-03-22 (×4): 3 mL via INTRAVENOUS

## 2024-03-20 MED ORDER — PREDNISONE 10 MG PO TABS
10.0000 mg | ORAL_TABLET | Freq: Every day | ORAL | Status: DC
  Administered 2024-03-21 – 2024-03-22 (×2): 10 mg via ORAL
  Filled 2024-03-20: qty 2
  Filled 2024-03-20: qty 1

## 2024-03-20 MED ORDER — INSULIN ASPART 100 UNIT/ML IJ SOLN
0.0000 [IU] | Freq: Three times a day (TID) | INTRAMUSCULAR | Status: DC
  Administered 2024-03-21: 5 [IU] via SUBCUTANEOUS
  Administered 2024-03-21: 8 [IU] via SUBCUTANEOUS
  Administered 2024-03-21 – 2024-03-22 (×2): 5 [IU] via SUBCUTANEOUS
  Filled 2024-03-20: qty 3
  Filled 2024-03-20 (×3): qty 5

## 2024-03-20 MED ORDER — INSULIN ASPART 100 UNIT/ML IV SOLN
10.0000 [IU] | Freq: Once | INTRAVENOUS | Status: AC
  Administered 2024-03-20: 10 [IU] via INTRAVENOUS
  Filled 2024-03-20: qty 10

## 2024-03-20 MED ORDER — ONDANSETRON HCL 4 MG PO TABS: 4.0000 mg | ORAL_TABLET | Freq: Four times a day (QID) | ORAL | Status: DC | PRN

## 2024-03-20 MED ORDER — TIZANIDINE HCL 4 MG PO TABS
4.0000 mg | ORAL_TABLET | Freq: Two times a day (BID) | ORAL | Status: DC | PRN
  Administered 2024-03-21 (×2): 4 mg via ORAL
  Filled 2024-03-20 (×3): qty 1

## 2024-03-20 MED ORDER — ALBUTEROL SULFATE (2.5 MG/3ML) 0.083% IN NEBU
10.0000 mg | INHALATION_SOLUTION | Freq: Once | RESPIRATORY_TRACT | Status: AC
  Administered 2024-03-20: 10 mg via RESPIRATORY_TRACT
  Filled 2024-03-20: qty 12

## 2024-03-20 MED ORDER — ACETAMINOPHEN 650 MG RE SUPP: 650.0000 mg | Freq: Four times a day (QID) | RECTAL | Status: DC | PRN

## 2024-03-20 MED ORDER — INSULIN ASPART 100 UNIT/ML IJ SOLN
0.0000 [IU] | Freq: Every day | INTRAMUSCULAR | Status: DC
  Administered 2024-03-20: 2 [IU] via SUBCUTANEOUS
  Filled 2024-03-20: qty 1
  Filled 2024-03-20: qty 2

## 2024-03-20 MED ORDER — FUROSEMIDE 20 MG PO TABS
20.0000 mg | ORAL_TABLET | Freq: Every day | ORAL | Status: DC
  Administered 2024-03-21 – 2024-03-22 (×2): 20 mg via ORAL
  Filled 2024-03-20 (×2): qty 1

## 2024-03-20 MED ORDER — ACETAMINOPHEN 325 MG PO TABS
650.0000 mg | ORAL_TABLET | Freq: Four times a day (QID) | ORAL | Status: DC | PRN
  Administered 2024-03-21: 650 mg via ORAL
  Filled 2024-03-20: qty 2

## 2024-03-20 MED ORDER — LATANOPROST 0.005 % OP SOLN
1.0000 [drp] | Freq: Every day | OPHTHALMIC | Status: DC
  Administered 2024-03-21: 1 [drp] via OPHTHALMIC
  Filled 2024-03-20: qty 2.5

## 2024-03-20 MED ORDER — SODIUM CHLORIDE 0.9 % IV BOLUS
1000.0000 mL | Freq: Once | INTRAVENOUS | Status: AC
  Administered 2024-03-20: 1000 mL via INTRAVENOUS

## 2024-03-20 MED ORDER — ALBUTEROL SULFATE (2.5 MG/3ML) 0.083% IN NEBU: 3.0000 mL | INHALATION_SOLUTION | Freq: Four times a day (QID) | RESPIRATORY_TRACT | Status: DC | PRN

## 2024-03-20 MED ORDER — CYANOCOBALAMIN 500 MCG PO TABS
2500.0000 ug | ORAL_TABLET | Freq: Every day | ORAL | Status: DC
  Administered 2024-03-21 – 2024-03-22 (×2): 2500 ug via ORAL
  Filled 2024-03-20 (×2): qty 5

## 2024-03-20 NOTE — ED Notes (Signed)
 Assessed patient for IV access. Difficulty on assessment for IV start. Pt states she wants ultrasound guided IV to be started. Secure chat sent to MD.

## 2024-03-20 NOTE — ED Notes (Signed)
 Second RN at bedside for ultrasound IV.

## 2024-03-20 NOTE — ED Notes (Signed)
 Blood consent form signed by patient. Paper form placed in [facility] drawer.

## 2024-03-20 NOTE — ED Notes (Signed)
 No adverse reaction to blood transfusion 15 minutes post transfusion start. Attending at bedside speaking with patient.

## 2024-03-20 NOTE — ED Notes (Signed)
 CCMD called for cardiac monitoring.

## 2024-03-20 NOTE — ED Triage Notes (Signed)
 Patient reports she is on her cycle which is consistently heavy and needs a blood transfusion, hx of same. Patient has had endometrial ablation with no relief; she also reports nausea and vomiting since yesterday but believes it is unrelated.

## 2024-03-20 NOTE — ED Provider Notes (Signed)
 " [place] [facility] AT [facility] Provider Note   CSN: [id number] Arrival date & time: 03/20/24  1109     Patient presents with: Chest Pain, Fatigue, and Emesis   Melinda Stone is a 44 y.o. female.   44 year old female with past medical history of chronic anemia and heavy periods has also had issues with hyperkalemia in the past presenting to the emergency department today with some dyspnea with exertion and lightheadedness.  The patient states that she is currently on her menstrual cycle.  She states that she has been having some epigastric and lower chest discomfort that is more of a burning in character.  Denies any blood in her stool or dark stools.  She did recently run out of her PPI.  She has had a few episodes of vomiting over the past few days that have been nonbloody and nonbilious.  She came to the ER today for further evaluation regarding this.   Chest Pain Associated symptoms: vomiting   Emesis      Prior to Admission medications  Medication Sig Start Date End Date Taking? Authorizing Provider  acetaminophen  (TYLENOL ) 500 MG tablet Take 1,000 mg by mouth every 6 (six) hours as needed for mild pain (pain score 1-3) or moderate pain (pain score 4-6).    [provider]  acetaZOLAMIDE ER (DIAMOX) 500 MG capsule Take 500 mg by mouth 2 (two) times daily. 02/05/24   [provider]  albuterol  (VENTOLIN  HFA) 108 (90 Base) MCG/ACT inhaler Inhale 2 puffs into the lungs every 6 (six) hours as needed for wheezing or shortness of breath. 05/29/23   Setzer, Nena PARAS, PA-C  amitriptyline  (ELAVIL ) 10 MG tablet Take 10 mg by mouth at bedtime. 10/22/23   [provider]  ascorbic acid  (VITAMIN C ) 500 MG tablet Take 500 mg by mouth in the morning.    [provider]  brimonidine  (ALPHAGAN ) 0.2 % ophthalmic solution Place 1 drop into the right eye 3 (three) times daily. 02/05/24   [provider]  celecoxib  (CELEBREX )  100 MG capsule Take 100 mg by mouth in the morning. 02/04/24   [provider]  Cyanocobalamin  2500 MCG TABS Take 2,500 mcg by mouth daily. 03/01/24   Lue Elsie BROCKS, MD  ferrous sulfate  325 (65 FE) MG tablet Take 1 tablet (325 mg total) by mouth daily with breakfast. 02/29/24   Lue Elsie BROCKS, MD  fluticasone  (FLONASE ) 50 MCG/ACT nasal spray Place 2 sprays into both nostrils daily. 02/29/24   Lue Elsie BROCKS, MD  furosemide  (LASIX ) 20 MG tablet TAKE 1 TABLET(20 MG) BY MOUTH IN THE MORNING 11/14/23   Tolia, Sunit, DO  gabapentin  (NEURONTIN ) 800 MG tablet Take 800 mg by mouth 3 (three) times daily.    [provider]  Galcanezumab -gnlm (EMGALITY ) 120 MG/ML SOAJ Inject 120 mg into the skin every 30 (thirty) days. 08/20/23   Gayland Lauraine PARAS, NP  Insulin  Disposable Pump (OMNIPOD 5 G7 PODS, GEN 5,) MISC 1.6 Units/hr by Implant route continuous. 02/29/24   Lue Elsie BROCKS, MD  insulin  lispro (HUMALOG ) 100 UNIT/ML injection Refill insulin  pump as directed 02/29/24   Lue Elsie BROCKS, MD  latanoprost (XALATAN) 0.005 % ophthalmic solution Place 1 drop into the right eye at bedtime.    [provider]  losartan  (COZAAR ) 25 MG tablet Take 25 mg by mouth daily.    [provider]  metoprolol  tartrate (LOPRESSOR ) 25 MG tablet Take 1 tablet (25 mg total) by mouth 2 (  two) times daily. 11/24/23   Arrien, Mauricio Daniel, MD  Multiple Vitamin (MULTIVITAMIN WITH MINERALS) TABS tablet Take 1 tablet by mouth daily. 05/29/23   Setzer, Sandra J, PA-C  oxyCODONE -acetaminophen  (PERCOCET) 7.5-325 MG tablet Take 1 tablet by mouth 4 (four) times daily as needed for moderate pain (pain score 4-6). 10/22/23   [provider]  pantoprazole  (PROTONIX ) 40 MG tablet Take 1 tablet (40 mg total) by mouth daily. 05/29/23   Setzer, Sandra J, PA-C  prednisoLONE  acetate (PRED FORTE ) 1 % ophthalmic suspension Place 1 drop into the right eye in the morning, at noon, and at bedtime.  02/12/24   [provider]  predniSONE  (DELTASONE ) 10 MG tablet Take 1 tablet (10 mg total) by mouth daily with breakfast. 03/16/24   Iruku, Praveena, MD  Rimegepant Sulfate (NURTEC) 75 MG TBDP Take 1 tablet (75 mg total) by mouth as needed. Take 1 tablet at onset of headache, max is 1 tablet in 24 hours. 12/10/23   Gayland Lauraine PARAS, NP  tiZANidine  (ZANAFLEX ) 4 MG tablet Take 4 mg by mouth every 12 (twelve) hours as needed for muscle spasms. 06/10/23   [provider]    Allergies: Trulicity  [dulaglutide ]    Review of Systems  Cardiovascular:  Positive for chest pain.  Gastrointestinal:  Positive for vomiting.  Neurological:  Positive for light-headedness.  All other systems reviewed and are negative.   Updated Vital Signs BP (!) 164/77 (BP Location: Right Arm)   Pulse 83   Temp 97.9 F (36.6 C) (Oral)   Resp (!) 21   Ht 5' 9 (1.753 m)   Wt 105.2 kg   SpO2 100%   BMI 34.25 kg/m   Physical Exam Vitals and nursing note reviewed.   Gen: NAD, pale appearing Eyes: PERRL, EOMI HEENT: no oropharyngeal swelling Neck: trachea midline Resp: clear to auscultation bilaterally Card: RRR, no murmurs, rubs, or gallops Abd: nontender, nondistended Extremities: no calf tenderness, no edema Vascular: 2+ radial pulses bilaterally, 2+ DP pulses bilaterally Skin: no rashes Psyc: acting appropriately   (all labs ordered are listed, but only abnormal results are displayed) Labs Reviewed  CBC WITH DIFFERENTIAL/PLATELET - Abnormal; Notable for the following components:      Result Value   RBC 2.41 (*)    Hemoglobin 6.9 (*)    HCT 20.4 (*)    RDW 18.4 (*)    All other components within normal limits  URINALYSIS, ROUTINE W REFLEX MICROSCOPIC - Abnormal; Notable for the following components:   Color, Urine RED (*)    APPearance TURBID (*)    Glucose, UA   (*)    Value: TEST NOT REPORTED DUE TO COLOR INTERFERENCE OF URINE PIGMENT   Hgb urine dipstick   (*)    Value: TEST  NOT REPORTED DUE TO COLOR INTERFERENCE OF URINE PIGMENT   Bilirubin Urine   (*)    Value: TEST NOT REPORTED DUE TO COLOR INTERFERENCE OF URINE PIGMENT   Ketones, ur   (*)    Value: TEST NOT REPORTED DUE TO COLOR INTERFERENCE OF URINE PIGMENT   Protein, ur   (*)    Value: TEST NOT REPORTED DUE TO COLOR INTERFERENCE OF URINE PIGMENT   Nitrite   (*)    Value: TEST NOT REPORTED DUE TO COLOR INTERFERENCE OF URINE PIGMENT   Leukocytes,Ua   (*)    Value: TEST NOT REPORTED DUE TO COLOR INTERFERENCE OF URINE PIGMENT   All other components within normal limits  BASIC METABOLIC PANEL  WITH GFR - Abnormal; Notable for the following components:   Potassium 6.0 (*)    Chloride 114 (*)    CO2 17 (*)    Glucose, Bld 194 (*)    BUN 22 (*)    Creatinine, Ser 1.09 (*)    Calcium  8.1 (*)    All other components within normal limits  URINALYSIS, MICROSCOPIC (REFLEX) - Abnormal; Notable for the following components:   Bacteria, UA RARE (*)    All other components within normal limits  CBG MONITORING, ED - Abnormal; Notable for the following components:   Glucose-Capillary 235 (*)    All other components within normal limits  TROPONIN T, HIGH SENSITIVITY - Abnormal; Notable for the following components:   Troponin T High Sensitivity 95 (*)    All other components within normal limits  TYPE AND SCREEN  PREPARE RBC (CROSSMATCH)  TROPONIN T, HIGH SENSITIVITY    EKG: EKG Interpretation Date/Time:  Friday March 20 2024 16:25:19 EST Ventricular Rate:  84 PR Interval:  183 QRS Duration:  78 QT Interval:  383 QTC Calculation: 453 R Axis:   15  Text Interpretation: Sinus rhythm Low voltage, precordial leads Confirmed by Ula Barter [phone]) on 03/20/2024 4:33:21 PM  Radiology: No results found.   Procedures   Medications Ordered in the ED  0.9 %  sodium chloride  infusion (Manually program via Guardrails IV Fluids) (has no administration in time range)  calcium  gluconate 1 g/ 50 mL sodium  chloride IVPB (1,000 mg Intravenous New Bag/Given 03/20/24 1741)  insulin  aspart (novoLOG ) injection 10 Units (10 Units Intravenous Given 03/20/24 1729)    And  dextrose  50 % solution 50 mL (50 mLs Intravenous Given 03/20/24 1730)  sodium zirconium cyclosilicate  (LOKELMA ) packet 10 g (10 g Oral Given 03/20/24 1742)  albuterol  (PROVENTIL ) (2.5 MG/3ML) 0.083% nebulizer solution 10 mg (10 mg Nebulization Given 03/20/24 1619)  sodium bicarbonate  injection 50 mEq (50 mEq Intravenous Given 03/20/24 1734)                                    Medical Decision Making 44 year old female with past medical history of chronic anemia and hypertension as well as issues with hyperkalemia in the past presenting to the emergency department today with dyspnea on exertion and lightheadedness.  Patient also reporting some epigastric/chest discomfort.  Abdominal exam is reassuring.  I will further evaluate here with basic labs Wels and EKG, chest x-ray, troponin to further evaluate for ACS, pulmonary edema, pulmonary filtration, pneumothorax.  Will also obtain a type and screen here.  Will give the patient the cardiac monitor and reevaluate.  The patient's potassium is elevated at 6.0.  Her hemoglobin is low at 6.9.  Blood transfusion is ordered as well as insulin , dextrose , calcium , bicarbonate, and Lokelma .  The patient is on losartan  so I suspect that this is the likely cause for this since her renal function is within normal limits.  Initial troponin is mildly elevated at 95 which I suspect is probably more of a demand ischemia rather than NSTEMI.  She will be admitted for further evaluation management.  The patient is found to have some mild peaked T waves on EKG.  She remained stable here.  Calls placed hospitalist service for admission.  CRITICAL CARE Performed by: Barter JONELLE Ula   Total critical care time: 45 minutes  Critical care time was exclusive of separately billable procedures and treating other  patients.  Critical care was necessary to treat or prevent imminent or life-threatening deterioration.  Critical care was time spent personally by me on the following activities: development of treatment plan with patient and/or surrogate as well as nursing, discussions with consultants, evaluation of patient's response to treatment, examination of patient, obtaining history from patient or surrogate, ordering and performing treatments and interventions, ordering and review of laboratory studies, ordering and review of radiographic studies, pulse oximetry and re-evaluation of patient's condition.   Amount and/or Complexity of Data Reviewed Labs: ordered.  Risk OTC drugs. Prescription drug management. Decision regarding hospitalization.        Final diagnoses:  Hyperkalemia  Anemia, unspecified type    ED Discharge Orders     None          Ula Prentice SAUNDERS, MD 03/20/24 1750  "

## 2024-03-20 NOTE — H&P (Signed)
 " History and Physical    Melinda Stone FMW:[id number] DOB: 08-13-1980 DOA: 03/20/2024  PCP: [name], NP  Patient coming from: Home  I have personally briefly reviewed patient's old medical records in [facility]  Chief Complaint: Shortness of breath, dizziness and chest pain  HPI: Melinda Stone is a 44 y.o. 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  Patient describes her chest pain more as  burning as she has history of GERD and she says  I forgot to take my medications.  She denies any fever, chills, sweating, palpitation, hematemesis, hematochezia or any other evidence of bleeding.  Currently she is symptom-free.  Of note, patient had recent hospitalization for 2 days and discharged on 02/29/2024 with a similar presentation.  ED Course: Upon arrival to ED, patient was hemodynamically stable with slightly elevated blood pressure.  She had brief episode of tachycardia and tachypnea.  Further lab work showed hemoglobin of 6.9 and potassium of 6.0 as well as mild metabolic acidosis with CO2 of 17.  Troponin 95> 106 with no acute ST-T wave changes on EKG.  Patient received Lokelma , dextrose , insulin  and albuterol  in the ED.  Hospitalist were called for admission.  Review of Systems: As per HPI otherwise negative.    Past Medical History:  Diagnosis Date   Anemia    Arthritis 08/17/2020   Chronic back pain    Diabetes mellitus without complication (HCC)    type 2   GERD (gastroesophageal reflux disease)    Hypertension    Migraine    Ovarian cyst     Pneumonia    Polyneuropathy     Past Surgical History:  Procedure Laterality Date   CATARACT EXTRACTION Right 11/08/2023   CESAREAN SECTION     x4   CHOLECYSTECTOMY  2004   EYE SURGERY Left    cataract   EYE SURGERY Left 10/2023   retinal surgery x2   HYSTEROSCOPY N/A 10/01/2023   Procedure: ABLATION, ENDOMETRIUM, HYSTEROSCOPIC;  Surgeon: Alger Gong, MD;  Location: [facility] OR;  Service: Gynecology;  Laterality: N/A;   IR BONE MARROW BIOPSY & ASPIRATION  08/01/2023   LUMBAR LAMINECTOMY N/A 05/30/2020   Procedure: Lumbar five Laminectomy,  Bilateral Microdiscectomy, Left Lumbar five -Sacral one Microdiscectomy;  Surgeon: Barbarann Oneil BROCKS, MD;  Location: [facility] OR;  Service: Orthopedics;  Laterality: N/A;   LUMBAR LAMINECTOMY Left 11/14/2020   Procedure: LEFT LUMBAR FOUR-FIVE MICRODISCECTOMY;  Surgeon: Barbarann Oneil BROCKS, MD;  Location: [facility] OR;  Service: Orthopedics;  Laterality: Left;   TUBAL LIGATION  10/19/2010     reports that she has been smoking cigars. She has never been exposed to tobacco smoke. She has never used smokeless tobacco. She reports that she does not currently use alcohol . She reports that she does not currently use drugs.  Allergies[1]  Family History  Problem Relation Age of Onset   Diabetes Mother    Hypertension Mother    Stroke Mother    Healthy Sister    Hypertension Brother    Asthma Brother    Hypertension Brother    Healthy Son    Healthy Daughter    Healthy Daughter     Prior to  Admission medications  Medication Sig Start Date End Date Taking? Authorizing Provider  acetaminophen  (TYLENOL ) 500 MG tablet Take 1,000 mg by mouth every 6 (six) hours as needed for mild pain (pain score 1-3) or moderate pain (pain score 4-6).    [provider]  acetaZOLAMIDE ER (DIAMOX) 500 MG capsule Take 500 mg by mouth 2 (two) times daily. 02/05/24   [provider]  albuterol  (VENTOLIN  HFA) 108 (90 Base) MCG/ACT inhaler Inhale 2 puffs into the lungs  every 6 (six) hours as needed for wheezing or shortness of breath. 05/29/23   Setzer, Nena PARAS, PA-C  amitriptyline  (ELAVIL ) 10 MG tablet Take 10 mg by mouth at bedtime. 10/22/23   [provider]  ascorbic acid  (VITAMIN C ) 500 MG tablet Take 500 mg by mouth in the morning.    [provider]  brimonidine  (ALPHAGAN ) 0.2 % ophthalmic solution Place 1 drop into the right eye 3 (three) times daily. 02/05/24   [provider]  celecoxib  (CELEBREX ) 100 MG capsule Take 100 mg by mouth in the morning. 02/04/24   [provider]  Cyanocobalamin  2500 MCG TABS Take 2,500 mcg by mouth daily. 03/01/24   Lue Elsie BROCKS, MD  ferrous sulfate  325 (65 FE) MG tablet Take 1 tablet (325 mg total) by mouth daily with breakfast. 02/29/24   Lue Elsie BROCKS, MD  fluticasone  (FLONASE ) 50 MCG/ACT nasal spray Place 2 sprays into both nostrils daily. 02/29/24   Lue Elsie BROCKS, MD  furosemide  (LASIX ) 20 MG tablet TAKE 1 TABLET(20 MG) BY MOUTH IN THE MORNING 11/14/23   Tolia, Sunit, DO  gabapentin  (NEURONTIN ) 800 MG tablet Take 800 mg by mouth 3 (three) times daily.    [provider]  Galcanezumab -gnlm (EMGALITY ) 120 MG/ML SOAJ Inject 120 mg into the skin every 30 (thirty) days. 08/20/23   Gayland Lauraine PARAS, NP  Insulin  Disposable Pump (OMNIPOD 5 G7 PODS, GEN 5,) MISC 1.6 Units/hr by Implant route continuous. 02/29/24   Lue Elsie BROCKS, MD  insulin  lispro (HUMALOG ) 100 UNIT/ML injection Refill insulin  pump as directed 02/29/24   Lue Elsie BROCKS, MD  latanoprost (XALATAN) 0.005 % ophthalmic solution Place 1 drop into the right eye at bedtime.    [provider]  losartan  (COZAAR ) 25 MG tablet Take 25 mg by mouth daily.    [provider]  metoprolol  tartrate (LOPRESSOR ) 25 MG tablet Take 1 tablet (25 mg total) by mouth 2 (two) times daily. 11/24/23   Arrien, Mauricio Daniel, MD  Multiple Vitamin (MULTIVITAMIN WITH MINERALS) TABS tablet Take 1 tablet by  mouth daily. 05/29/23   Setzer, Sandra J, PA-C  oxyCODONE -acetaminophen  (PERCOCET) 7.5-325 MG tablet Take 1 tablet by mouth 4 (four) times daily as needed for moderate pain (pain score 4-6). 10/22/23   [provider]  pantoprazole  (PROTONIX ) 40 MG tablet Take 1 tablet (40 mg total) by mouth daily. 05/29/23   Setzer, Sandra J, PA-C  prednisoLONE  acetate (PRED FORTE ) 1 % ophthalmic suspension Place 1 drop into the right eye in the morning, at noon, and at bedtime. 02/12/24   [provider]  predniSONE  (DELTASONE ) 10 MG tablet Take 1 tablet (10 mg total) by mouth daily with breakfast. 03/16/24   Iruku, Praveena, MD  Rimegepant Sulfate (NURTEC) 75 MG TBDP Take 1 tablet (75 mg total) by mouth as needed. Take 1 tablet at onset of headache, max is 1 tablet in 24 hours. 12/10/23   Gayland Lauraine PARAS, NP  tiZANidine  (ZANAFLEX ) 4 MG tablet Take 4  mg by mouth every 12 (twelve) hours as needed for muscle spasms. 06/10/23   [provider]    Physical Exam: Vitals:   03/20/24 1620 03/20/24 1628 03/20/24 1704 03/20/24 1824  BP:  (!) 164/77  (!) 181/88  Pulse:  83  (!) 111  Resp:  (!) 21  (!) 24  Temp:   97.9 F (36.6 C) 97.9 F (36.6 C)  TempSrc:   Oral Oral  SpO2: 100% 100%  100%  Weight:      Height:        Constitutional: NAD, calm, comfortable Vitals:   03/20/24 1620 03/20/24 1628 03/20/24 1704 03/20/24 1824  BP:  (!) 164/77  (!) 181/88  Pulse:  83  (!) 111  Resp:  (!) 21  (!) 24  Temp:   97.9 F (36.6 C) 97.9 F (36.6 C)  TempSrc:   Oral Oral  SpO2: 100% 100%  100%  Weight:      Height:       Eyes: PERRL, lids and conjunctivae normal ENMT: Mucous membranes are moist. Posterior pharynx clear of any exudate or lesions.Normal dentition.  Neck: normal, supple, no masses, no thyromegaly Respiratory: clear to auscultation bilaterally, no wheezing, no crackles. Normal respiratory effort. No accessory muscle use.  Cardiovascular: Regular rate and rhythm, no murmurs / rubs  / gallops. No extremity edema. 2+ pedal pulses. No carotid bruits.  Abdomen: no tenderness, no masses palpated. No hepatosplenomegaly. Bowel sounds positive.  Musculoskeletal: no clubbing / cyanosis. No joint deformity upper and lower extremities. Good ROM, no contractures. Normal muscle tone.  Skin: no rashes, lesions, ulcers. No induration Neurologic: CN 2-12 grossly intact. Sensation intact, DTR normal. Strength 5/5 in all 4.  Psychiatric: Normal judgment and insight. Alert and oriented x 3. Normal mood.    Labs on Admission: I have personally reviewed following labs and imaging studies  CBC: Recent Labs  Lab 03/16/24 1303 03/20/24 1218  WBC 11.1* 9.2  NEUTROABS 8.3* 7.4  HGB 8.3* 6.9*  HCT 23.7* 20.4*  MCV 80.9 84.6  PLT 132* 161   Basic Metabolic Panel: Recent Labs  Lab 03/20/24 1358  NA 138  K 6.0*  CL 114*  CO2 17*  GLUCOSE 194*  BUN 22*  CREATININE 1.09*  CALCIUM  8.1*   GFR: Estimated Creatinine Clearance: 87.7 mL/min (A) (by C-G formula based on SCr of 1.09 mg/dL (H)). Liver Function Tests: No results for input(s): AST, ALT, ALKPHOS, BILITOT, PROT, ALBUMIN in the last 168 hours. No results for input(s): LIPASE, AMYLASE in the last 168 hours. No results for input(s): AMMONIA in the last 168 hours. Coagulation Profile: No results for input(s): INR, PROTIME in the last 168 hours. Cardiac Enzymes: No results for input(s): CKTOTAL, CKMB, CKMBINDEX, TROPONINI in the last 168 hours. BNP (last 3 results) Recent Labs    07/16/23 1040  PROBNP 1,695*   HbA1C: No results for input(s): HGBA1C in the last 72 hours. CBG: Recent Labs  Lab 03/20/24 1726  GLUCAP 235*   Lipid Profile: No results for input(s): CHOL, HDL, LDLCALC, TRIG, CHOLHDL, LDLDIRECT in the last 72 hours. Thyroid  Function Tests: No results for input(s): TSH, T4TOTAL, FREET4, T3FREE, THYROIDAB in the last 72 hours. Anemia Panel: No results  for input(s): VITAMINB12, FOLATE, FERRITIN, TIBC, IRON , RETICCTPCT in the last 72 hours. Urine analysis:    Component Value Date/Time   COLORURINE RED (A) 03/20/2024 1202   APPEARANCEUR TURBID (A) 03/20/2024 1202   LABSPEC 1.025 03/20/2024 1202   PHURINE 5.0 03/20/2024 1202  GLUCOSEU (A) 03/20/2024 1202    TEST NOT REPORTED DUE TO COLOR INTERFERENCE OF URINE PIGMENT   HGBUR (A) 03/20/2024 1202    TEST NOT REPORTED DUE TO COLOR INTERFERENCE OF URINE PIGMENT   BILIRUBINUR (A) 03/20/2024 1202    TEST NOT REPORTED DUE TO COLOR INTERFERENCE OF URINE PIGMENT   BILIRUBINUR negative 07/06/2023 1033   KETONESUR (A) 03/20/2024 1202    TEST NOT REPORTED DUE TO COLOR INTERFERENCE OF URINE PIGMENT   PROTEINUR (A) 03/20/2024 1202    TEST NOT REPORTED DUE TO COLOR INTERFERENCE OF URINE PIGMENT   UROBILINOGEN 0.2 07/06/2023 1033   NITRITE (A) 03/20/2024 1202    TEST NOT REPORTED DUE TO COLOR INTERFERENCE OF URINE PIGMENT   LEUKOCYTESUR (A) 03/20/2024 1202    TEST NOT REPORTED DUE TO COLOR INTERFERENCE OF URINE PIGMENT    Radiological Exams on Admission: No results found.  EKG: Independently reviewed.  Sinus rhythm with no acute ST-T wave changes.  Assessment/Plan Principal Problem:   ABLA (acute blood loss anemia) Active Problems:   Iron  deficiency anemia due to chronic blood loss   Iron  deficiency anemia due to recurrent and chronic blood loss anemia due to menorrhagia, POA: Patient has been struggling with menorrhagia for a very long time.  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  Hemoglobin once again is 6.9 secondary to menorrhagia, 1 unit of therapy transfusion has been ordered by ED physician.  We will check hemoglobin posttransfusion.  Patient will need to follow-up with heme-onc as well as gynecology as  outpatient.  Resume iron  tablets.  Hyperkalemia: Potassium 6.0.  This is also a recurrent issue.  Patient has received calcium  gluconate, albuterol , dextrose , Lokelma  and Lasix  in the ED.  Monitor on telemetry.  Repeat potassium tomorrow morning.  Chest pain/elevated troponin/GERD: Troponin slightly elevated and essentially flat, likely secondary demand ischemia in the setting of anemia however due to history of diastolic congestive heart failure, I will update echo since the last available in chart is from last year.  Resume PPI.  Nausea/vomiting/dehydration mild metabolic acidosis: Creatinine slightly related but did not meet criteria for AKI.  Reviewed chart extensively, she has varying level of creatinine in the past.  She has CO2 of 17 likely due to dehydration.  She has not received any fluids in the ED.  I will provide her with 1 L of IV fluid bolus over 4 hours.  Repeat labs in the morning.  Treat nausea and vomiting symptomatically.  Essential hypertension: Patient appears to be on metoprolol  and losartan  PTA.  Due to recurrent hyperkalemia, I am holding losartan  and would recommend not continuing at discharge.  Resume metoprolol .  Type 2 diabetes mellitus: On insulin  pump.  Will hold that.  Start on SSI while inpatient.  DVT prophylaxis: SCDs Start: 03/20/24 1825 Code Status: Full code Family Communication: none present at bedside.  Plan of care discussed with patient in length and he verbalized understanding and agreed with it. Disposition Plan: Potential discharge tomorrow Consults called: None  Fredia Skeeter MD [facility]  *Please note that this is a verbal dictation therefore any spelling or grammatical errors are due to the Dragon [employer] One system interpretation.  Please page via Amion and do not message via secure chat for urgent patient care matters. Secure chat can be used for non urgent patient care matters. 03/20/2024, 6:28  PM  To contact the attending provider  between 7A-7P or the covering provider during after hours 7P-7A, please log into the web site [email or web address]     [1]  Allergies Allergen Reactions   Trulicity  [Dulaglutide ] Diarrhea   "

## 2024-03-20 NOTE — ED Provider Triage Note (Addendum)
 Emergency Medicine Provider Triage Evaluation Note  Melinda Stone , a 44 y.o. female  was evaluated in triage.  Pt complains of headache, CP, SHOB. Hx of anemia, concerned her hgb may be low again. CP is intermittent, located left side of chest, does not radiate, nothing makes pain better or worse. Ran out of her Pantoprazole , thinks CP may be related to this.  Prior blood transfusion. Went to her PCP Monday and hgb was 8.3, started menstrual cycle 2 days ago, feeling poorly ever since. Using depends currently, 3-4 daily, with clots.   Review of Systems  Positive:  Negative:   Physical Exam  BP 134/65   Pulse 82   Temp 98.8 F (37.1 C)   Resp 18   Ht 5' 9 (1.753 m)   Wt 105.2 kg   SpO2 100%   BMI 34.25 kg/m  Gen:   Awake, no distress   Resp:  Normal effort  MSK:   Moves extremities without difficulty  Other:    Medical Decision Making  Medically screening exam initiated at 12:42 PM.  Appropriate orders placed.  Melinda Stone was informed that the remainder of the evaluation will be completed by another provider, this initial triage assessment does not replace that evaluation, and the importance of remaining in the ED until their evaluation is complete.     Beverley Leita LABOR, PA-C 03/20/24 1244    Beverley Leita LABOR, PA-C 03/20/24 1245

## 2024-03-20 NOTE — ED Notes (Signed)
No adverse reaction to blood transfusion

## 2024-03-21 ENCOUNTER — Observation Stay (HOSPITAL_COMMUNITY)

## 2024-03-21 DIAGNOSIS — R7989 Other specified abnormal findings of blood chemistry: Secondary | ICD-10-CM

## 2024-03-21 DIAGNOSIS — R079 Chest pain, unspecified: Secondary | ICD-10-CM

## 2024-03-21 DIAGNOSIS — D500 Iron deficiency anemia secondary to blood loss (chronic): Secondary | ICD-10-CM | POA: Diagnosis not present

## 2024-03-21 LAB — GLUCOSE, CAPILLARY
Glucose-Capillary: 299 mg/dL — ABNORMAL HIGH (ref 70–99)
Glucose-Capillary: 264 mg/dL — ABNORMAL HIGH (ref 70–99)
Glucose-Capillary: 122 mg/dL — ABNORMAL HIGH (ref 70–99)
Glucose-Capillary: 89 mg/dL (ref 70–99)
Glucose-Capillary: 192 mg/dL — ABNORMAL HIGH (ref 70–99)

## 2024-03-21 LAB — CBC WITH DIFFERENTIAL/PLATELET
Abs Immature Granulocytes: 0.04 K/uL (ref 0.00–0.07)
Basophils Absolute: 0.1 K/uL (ref 0.0–0.1)
Basophils Relative: 1 %
Eosinophils Absolute: 0 K/uL (ref 0.0–0.5)
Eosinophils Relative: 0 %
HCT: 23.8 % — ABNORMAL LOW (ref 36.0–46.0)
Hemoglobin: 8.5 g/dL — ABNORMAL LOW (ref 12.0–15.0)
Immature Granulocytes: 0 %
Lymphocytes Relative: 10 %
Lymphs Abs: 1 K/uL (ref 0.7–4.0)
MCH: 29.6 pg (ref 26.0–34.0)
MCHC: 35.7 g/dL (ref 30.0–36.0)
MCV: 82.9 fL (ref 80.0–100.0)
Monocytes Absolute: 0.4 K/uL (ref 0.1–1.0)
Monocytes Relative: 4 %
Neutro Abs: 7.9 K/uL — ABNORMAL HIGH (ref 1.7–7.7)
Neutrophils Relative %: 85 %
Platelets: 144 K/uL — ABNORMAL LOW (ref 150–400)
RBC: 2.87 MIL/uL — ABNORMAL LOW (ref 3.87–5.11)
RDW: 17.5 % — ABNORMAL HIGH (ref 11.5–15.5)
WBC: 9.4 K/uL (ref 4.0–10.5)
nRBC: 0 % (ref 0.0–0.2)

## 2024-03-21 LAB — CBC
HCT: 20.5 % — ABNORMAL LOW (ref 36.0–46.0)
Hemoglobin: 6.9 g/dL — CL (ref 12.0–15.0)
MCH: 29.2 pg (ref 26.0–34.0)
MCHC: 33.7 g/dL (ref 30.0–36.0)
MCV: 86.9 fL (ref 80.0–100.0)
Platelets: 137 K/uL — ABNORMAL LOW (ref 150–400)
RBC: 2.36 MIL/uL — ABNORMAL LOW (ref 3.87–5.11)
RDW: 18.4 % — ABNORMAL HIGH (ref 11.5–15.5)
WBC: 8.4 K/uL (ref 4.0–10.5)
nRBC: 0 % (ref 0.0–0.2)

## 2024-03-21 LAB — BASIC METABOLIC PANEL WITH GFR
Anion gap: 6 (ref 5–15)
Anion gap: 6 (ref 5–15)
BUN: 21 mg/dL — ABNORMAL HIGH (ref 6–20)
BUN: 25 mg/dL — ABNORMAL HIGH (ref 6–20)
CO2: 19 mmol/L — ABNORMAL LOW (ref 22–32)
CO2: 19 mmol/L — ABNORMAL LOW (ref 22–32)
Calcium: 7.8 mg/dL — ABNORMAL LOW (ref 8.9–10.3)
Calcium: 8 mg/dL — ABNORMAL LOW (ref 8.9–10.3)
Chloride: 113 mmol/L — ABNORMAL HIGH (ref 98–111)
Chloride: 109 mmol/L (ref 98–111)
Creatinine, Ser: 1.07 mg/dL — ABNORMAL HIGH (ref 0.44–1.00)
Creatinine, Ser: 1.16 mg/dL — ABNORMAL HIGH (ref 0.44–1.00)
GFR, Estimated: >60 mL/min
GFR, Estimated: >60 mL/min
Glucose, Bld: 222 mg/dL — ABNORMAL HIGH (ref 70–99)
Glucose, Bld: 238 mg/dL — ABNORMAL HIGH (ref 70–99)
Potassium: 6 mmol/L — ABNORMAL HIGH (ref 3.5–5.1)
Potassium: 6 mmol/L — ABNORMAL HIGH (ref 3.5–5.1)
Sodium: 138 mmol/L (ref 135–145)
Sodium: 134 mmol/L — ABNORMAL LOW (ref 135–145)

## 2024-03-21 LAB — ECHOCARDIOGRAM COMPLETE
Area-P 1/2: 4.33 cm2
Calc EF: 64.4 %
Height: 69 in
S' Lateral: 2.8 cm
Single Plane A2C EF: 67.9 %
Single Plane A4C EF: 60 %
Weight: 3710.78 [oz_av]

## 2024-03-21 LAB — PREPARE RBC (CROSSMATCH)

## 2024-03-21 LAB — CBG MONITORING, ED
Glucose-Capillary: 219 mg/dL — ABNORMAL HIGH (ref 70–99)
Glucose-Capillary: 234 mg/dL — ABNORMAL HIGH (ref 70–99)

## 2024-03-21 MED ORDER — DEXTROSE 50 % IV SOLN
1.0000 | Freq: Once | INTRAVENOUS | Status: AC
  Administered 2024-03-21: 50 mL via INTRAVENOUS
  Filled 2024-03-21: qty 50

## 2024-03-21 MED ORDER — AMLODIPINE BESYLATE 5 MG PO TABS
5.0000 mg | ORAL_TABLET | Freq: Every day | ORAL | Status: DC
  Administered 2024-03-21 – 2024-03-22 (×2): 5 mg via ORAL
  Filled 2024-03-21 (×2): qty 1

## 2024-03-21 MED ORDER — INSULIN ASPART 100 UNIT/ML IV SOLN
10.0000 [IU] | Freq: Once | INTRAVENOUS | Status: AC
  Administered 2024-03-21: 10 [IU] via INTRAVENOUS
  Filled 2024-03-21: qty 10

## 2024-03-21 MED ORDER — SODIUM CHLORIDE 0.9% IV SOLUTION: Freq: Once | INTRAVENOUS | Status: AC

## 2024-03-21 MED ORDER — INSULIN ASPART 100 UNIT/ML IJ SOLN
10.0000 [IU] | Freq: Once | INTRAMUSCULAR | Status: AC
  Administered 2024-03-21: 10 [IU] via SUBCUTANEOUS
  Filled 2024-03-21: qty 10

## 2024-03-21 MED ORDER — SODIUM ZIRCONIUM CYCLOSILICATE 10 G PO PACK
10.0000 g | PACK | Freq: Two times a day (BID) | ORAL | Status: DC
  Administered 2024-03-21 – 2024-03-22 (×3): 10 g via ORAL
  Filled 2024-03-21 (×3): qty 1

## 2024-03-21 MED ORDER — HYDRALAZINE HCL 20 MG/ML IJ SOLN: 10.0000 mg | Freq: Four times a day (QID) | INTRAMUSCULAR | Status: DC | PRN

## 2024-03-21 NOTE — Progress Notes (Signed)
 " PROGRESS NOTE    Melinda Stone  FMW:[id number] DOB: Jun 23, 1980 DOA: 03/20/2024 PCP: [name], NP   Brief Narrative:  HPI: Melinda Stone is a 44 y.o. 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  Patient describes her chest pain more as  burning as she has history of GERD and she says  I forgot to take my medications.  She denies any fever, chills, sweating, palpitation, hematemesis, hematochezia or any other evidence of bleeding.  Currently she is symptom-free.   Of note, patient had recent hospitalization for 2 days and discharged on 02/29/2024 with a similar presentation.   ED Course: Upon arrival to ED, patient was hemodynamically stable with slightly elevated blood pressure.  She had brief episode of tachycardia and tachypnea.  Further lab work showed hemoglobin of 6.9 and potassium of 6.0 as well as mild metabolic acidosis with CO2 of 17.  Troponin 95> 106 with no acute ST-T wave changes on EKG.  Patient received Lokelma , dextrose , insulin  and albuterol  in the ED.  Hospitalist were called for admission.  Assessment & Plan:   Active Problems:   Iron  deficiency anemia due to chronic blood loss  Iron  deficiency anemia due to recurrent and chronic blood loss anemia due to menorrhagia, POA: Patient has been struggling with menorrhagia for a very long time.  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  Hemoglobin once again is 6.9 upon presentation secondary to menorrhagia for which she received 1 unit of PRBC transfusion however hemoglobin once again is still 6.9, I have ordered another PRBC 1 unit transfusion.   Hyperkalemia: Potassium 6.0.  This is also a recurrent issue.  Patient has received calcium  gluconate, albuterol , dextrose , Lokelma  and Lasix  in the ED, yet patient's potassium is 6.0.  I will repeat Lokelma  10 g twice daily.  Repeat BMP later today.   Chest pain/elevated troponin/GERD: Troponin slightly elevated and essentially flat, likely secondary demand ischemia in the setting of anemia however due to history of diastolic congestive heart failure, I will update echo since the last available in chart is from last year.  Continue PPI.   Nausea/vomiting/dehydration mild metabolic acidosis: Creatinine slightly elevated but did not meet criteria for AKI.  Reviewed chart extensively, she has varying level of creatinine in the past.  Presented with CO2 of 17.  Slightly improved to 19 after IV fluids.  Will resume IV fluids for another 20 hours.   Essential hypertension: Patient appears to be on metoprolol  and losartan  PTA.  Due to recurrent hyperkalemia, I am holding losartan  and would recommend not continuing at discharge.  Resumed metoprolol .  Blood pressure elevated.  Will start on amlodipine  5 mg.   Type 2 diabetes mellitus: On insulin  pump.  Will hold that.  Start on SSI while inpatient.  DVT prophylaxis: SCDs Start: 03/20/24 1825   Code Status: Full Code  Family Communication:  None present at bedside.  Plan of care discussed with patient in length and he/she verbalized understanding and agreed with it.  Status is: Observation The patient will  require care spanning > 2 midnights and should be moved to inpatient because: Needs more blood transfusion   Estimated body mass index is 34.25 kg/m as calculated from the following:   Height as of this encounter: 5' 9 (1.753  m).   Weight as of this encounter: 105.2 kg.    Nutritional Assessment: Body mass index is 34.25 kg/m.Melinda Stone Seen by dietician.  I agree with the assessment and plan as outlined below: Nutrition Status:        . Skin Assessment: I have examined the patient's skin and I agree with the wound assessment as performed by the wound care RN as outlined below:    Consultants:  None  Procedures:  None  Antimicrobials:  Anti-infectives (From admission, onward)    None         Subjective: Patient seen and examined, sitting at the edge of the bed.  Nurse at the bedside.  Patient feels better.  No complaints today.  Objective: Vitals:   03/20/24 2318 03/21/24 0229 03/21/24 0300 03/21/24 0547  BP: (!) 176/88 (!) 174/85  (!) 163/89  Pulse: 91 89  84  Resp: 14 14  16   Temp: 99.6 F (37.6 C)  98.4 F (36.9 C)   TempSrc:   Oral   SpO2: 100% 100%  100%  Weight:      Height:        Intake/Output Summary (Last 24 hours) at 03/21/2024 0803 Last data filed at 03/20/2024 2148 Gross per 24 hour  Intake 1533.33 ml  Output --  Net 1533.33 ml   Filed Weights   03/20/24 1152  Weight: 105.2 kg    Examination:  General exam: Appears calm and comfortable  Respiratory system: Clear to auscultation. Respiratory effort normal. Cardiovascular system: S1 & S2 heard, RRR. No JVD, murmurs, rubs, gallops or clicks. No pedal edema. Gastrointestinal system: Abdomen is nondistended, soft and nontender. No organomegaly or masses felt. Normal bowel sounds heard. Central nervous system: Alert and oriented. No focal neurological deficits. Extremities: Symmetric 5 x 5 power. Skin: No rashes, lesions or ulcers Psychiatry: Judgement and insight appear normal. Mood & affect appropriate.    Data Reviewed: I have personally reviewed following labs and imaging studies  CBC: Recent Labs  Lab 03/16/24 1303 03/20/24 1218 03/21/24 0230  WBC 11.1* 9.2 8.4  NEUTROABS 8.3* 7.4  --   HGB 8.3* 6.9* 6.9*   HCT 23.7* 20.4* 20.5*  MCV 80.9 84.6 86.9  PLT 132* 161 137*   Basic Metabolic Panel: Recent Labs  Lab 03/20/24 1358 03/21/24 0230  NA 138 138  K 6.0* 6.0*  CL 114* 113*  CO2 17* 19*  GLUCOSE 194* 222*  BUN 22* 21*  CREATININE 1.09* 1.07*  CALCIUM  8.1* 7.8*   GFR: Estimated Creatinine Clearance: 89.3 mL/min (A) (by C-G formula based on SCr of 1.07 mg/dL (H)). Liver Function Tests: No results for input(s): AST, ALT, ALKPHOS, BILITOT, PROT, ALBUMIN in the last 168 hours. No results for input(s): LIPASE, AMYLASE in the last 168 hours. No results for input(s): AMMONIA in the last 168 hours. Coagulation Profile: No results for input(s): INR, PROTIME in the last 168 hours. Cardiac Enzymes: No results for input(s): CKTOTAL, CKMB, CKMBINDEX, TROPONINI in the last 168 hours. BNP (last 3 results) Recent Labs    07/16/23 1040  PROBNP 1,695*   HbA1C: No results for input(s): HGBA1C in the last 72 hours. CBG: Recent Labs  Lab 03/20/24 1726 03/20/24 2107  GLUCAP 235* 240*   Lipid Profile:  No results for input(s): CHOL, HDL, LDLCALC, TRIG, CHOLHDL, LDLDIRECT in the last 72 hours. Thyroid  Function Tests: No results for input(s): TSH, T4TOTAL, FREET4, T3FREE, THYROIDAB in the last 72 hours. Anemia Panel: No results for input(s): VITAMINB12, FOLATE, FERRITIN, TIBC, IRON , RETICCTPCT in the last 72 hours. Sepsis Labs: No results for input(s): PROCALCITON, LATICACIDVEN in the last 168 hours.  No results found for this or any previous visit (from the past 240 hours).   Radiology Studies: No results found.  Scheduled Meds:  sodium chloride    Intravenous Once   amitriptyline   10 mg Oral QHS   ascorbic acid   500 mg Oral q AM   brimonidine   1 drop Right Eye TID   celecoxib   100 mg Oral q AM   Cyanocobalamin   2,500 mcg Oral Daily   ferrous sulfate   325 mg Oral Q breakfast   furosemide   20 mg Oral Daily    gabapentin   800 mg Oral TID   insulin  aspart  0-15 Units Subcutaneous TID WC   insulin  aspart  0-5 Units Subcutaneous QHS   latanoprost   1 drop Right Eye QHS   metoprolol  tartrate  25 mg Oral BID   pantoprazole   40 mg Oral Daily   prednisoLONE  acetate  1 drop Right Eye TID   predniSONE   10 mg Oral Q breakfast   sodium chloride  flush  3 mL Intravenous Q12H   sodium zirconium cyclosilicate   10 g Oral BID   Continuous Infusions:   LOS: 0 days   Fredia Skeeter, MD [facility]  03/21/2024, 8:03 AM   *Please note that this is a verbal dictation therefore any spelling or grammatical errors are due to the Dragon [employer] One system interpretation.  Please page via Amion and do not message via secure chat for urgent patient care matters. Secure chat can be used for non urgent patient care matters.  How to contact the [facility] Attending or Consulting provider 7A - 7P or covering provider during after hours 7P -7A, for this patient?  Check the care team in [facility] and look for a) attending/consulting [facility] provider listed and b) the [facility] team listed. Page or secure chat 7A-7P. Log into [email or web address] and use [place]'s universal password to access. If you do not have the password, please contact the hospital operator. Locate the [facility] provider you are looking for under [facility] and page to a number that you can be directly reached. If you still have difficulty reaching the provider, please page the [facility] (Director on Call) for the Hospitalists listed on amion for assistance.  "

## 2024-03-21 NOTE — Care Management Obs Status (Signed)
 MEDICARE OBSERVATION STATUS NOTIFICATION   Patient Details  Name: ZEENA STARKEL MRN: [id number] Date of Birth: 02/10/1981   Medicare Observation Status Notification Given:  Yes    Corean JAYSON Canary, RN 03/21/2024, 2:12 PM

## 2024-03-21 NOTE — Progress Notes (Signed)
" °  Echocardiogram 2D Echocardiogram has been performed.  Tinnie FORBES Gosling RDCS 03/21/2024, 3:52 PM "

## 2024-03-22 ENCOUNTER — Other Ambulatory Visit (HOSPITAL_COMMUNITY): Payer: Self-pay

## 2024-03-22 DIAGNOSIS — D500 Iron deficiency anemia secondary to blood loss (chronic): Secondary | ICD-10-CM | POA: Diagnosis not present

## 2024-03-22 LAB — BPAM RBC
Blood Product Expiration Date: 202601182359
Blood Product Expiration Date: 202601252359
ISSUE DATE / TIME: 202601030837
ISSUE DATE / TIME: 202601021808
Unit Type and Rh: 6200
Unit Type and Rh: 6200

## 2024-03-22 LAB — CBC WITH DIFFERENTIAL/PLATELET
Abs Immature Granulocytes: 0.03 K/uL (ref 0.00–0.07)
Basophils Absolute: 0 K/uL (ref 0.0–0.1)
Basophils Relative: 1 %
Eosinophils Absolute: 0 K/uL (ref 0.0–0.5)
Eosinophils Relative: 0 %
HCT: 22.9 % — ABNORMAL LOW (ref 36.0–46.0)
Hemoglobin: 7.9 g/dL — ABNORMAL LOW (ref 12.0–15.0)
Immature Granulocytes: 0 %
Lymphocytes Relative: 18 %
Lymphs Abs: 1.3 K/uL (ref 0.7–4.0)
MCH: 29.2 pg (ref 26.0–34.0)
MCHC: 34.5 g/dL (ref 30.0–36.0)
MCV: 84.5 fL (ref 80.0–100.0)
Monocytes Absolute: 0.4 K/uL (ref 0.1–1.0)
Monocytes Relative: 6 %
Neutro Abs: 5.8 K/uL (ref 1.7–7.7)
Neutrophils Relative %: 75 %
Platelets: 131 K/uL — ABNORMAL LOW (ref 150–400)
RBC: 2.71 MIL/uL — ABNORMAL LOW (ref 3.87–5.11)
RDW: 18.5 % — ABNORMAL HIGH (ref 11.5–15.5)
WBC: 7.6 K/uL (ref 4.0–10.5)
nRBC: 0 % (ref 0.0–0.2)

## 2024-03-22 LAB — BASIC METABOLIC PANEL WITH GFR
Anion gap: 8 (ref 5–15)
BUN: 28 mg/dL — ABNORMAL HIGH (ref 6–20)
CO2: 17 mmol/L — ABNORMAL LOW (ref 22–32)
Calcium: 8.2 mg/dL — ABNORMAL LOW (ref 8.9–10.3)
Chloride: 109 mmol/L (ref 98–111)
Creatinine, Ser: 1.11 mg/dL — ABNORMAL HIGH (ref 0.44–1.00)
GFR, Estimated: >60 mL/min
Glucose, Bld: 242 mg/dL — ABNORMAL HIGH (ref 70–99)
Potassium: 5.1 mmol/L (ref 3.5–5.1)
Sodium: 134 mmol/L — ABNORMAL LOW (ref 135–145)

## 2024-03-22 LAB — TYPE AND SCREEN
ABO/RH(D): A POS
Antibody Screen: NEGATIVE
Unit division: 0
Unit division: 0

## 2024-03-22 LAB — GLUCOSE, CAPILLARY: Glucose-Capillary: 217 mg/dL — ABNORMAL HIGH (ref 70–99)

## 2024-03-22 MED ORDER — AMLODIPINE BESYLATE 10 MG PO TABS
10.0000 mg | ORAL_TABLET | Freq: Every day | ORAL | 0 refills | Status: AC
  Filled 2024-03-22: qty 30, 30d supply, fill #0

## 2024-03-22 NOTE — Discharge Summary (Signed)
 Physician Discharge Summary  Melinda Stone FMW:[id number] DOB: 1980/12/01 DOA: 03/20/2024  PCP: [name], NP  Admit date: 03/20/2024 Discharge date: 03/22/2024 30 Day Unplanned Readmission Risk Score    Flowsheet Row ED to Hosp-Admission (Discharged) from 02/27/2024 in [facility]  SURGICAL  30 Day Unplanned Readmission Risk Score (%) 43.56 Filed at 02/29/2024 1200    This score is the patient's risk of an unplanned readmission within 30 days of being discharged (0 -100%). The score is based on dignosis, age, lab data, medications, orders, and past utilization.   Low:  0-14.9   Medium: 15-21.9   High: 22-29.9   Extreme: 30 and above          Admitted From: Home Disposition: Home  Recommendations for Outpatient Follow-up:  Follow up with PCP in 1-2 weeks Please obtain BMP/CBC in one week Follow-up with your oncologist as soon as possible Follow-up with your gynecologist as soon as possible Please follow up with your PCP on the following pending results: Unresulted Labs (From admission, onward)     Start     Ordered   03/21/24 1700  CBC with Differential/Platelet  5A & 5P,   R (with TIMED occurrences)      03/21/24 0922              Home Health: None Equipment/Devices: None none  Discharge Condition: Stable CODE STATUS: Full code Diet recommendation:  Diet Order             Diet Heart Room service appropriate? Yes; Fluid consistency: Thin  Diet effective now                 Due to brief hospitalization and better understanding, have copied HPI and ED course as below.  HPI: MIKYLAH Stone is a 44 y.o. 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  Patient describes her chest pain more as  burning as she has history of GERD and she says  I forgot to take my medications.  She denies any fever, chills, sweating, palpitation, hematemesis, hematochezia or any other evidence of bleeding.  Currently she is symptom-free.   Of note, patient had recent hospitalization for 2 days and discharged on 02/29/2024 with a similar presentation.   ED Course: Upon arrival to ED, patient was hemodynamically stable with slightly elevated blood pressure.  She had brief episode of tachycardia and tachypnea.  Further lab work showed hemoglobin of 6.9 and potassium of 6.0 as well as mild metabolic acidosis with CO2 of 17.  Troponin 95> 106 with no acute ST-T wave changes on EKG.  Patient received Lokelma , dextrose , insulin  and albuterol  in the ED.  Hospitalist were called for admission.  Subjective: Patient seen and examined, she is sitting in the bench, she says that she is ready to go home.  She has no other complaint.  Her menstrual bleeding has improved and she is only seeing spotting.  She wants to go home.  Brief/Interim Summary: Details below.  Iron  deficiency anemia due to recurrent and chronic blood loss anemia due to menorrhagia, POA: Patient has been struggling with menorrhagia for a very long time.  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  Hemoglobin once again is 6.9 upon presentation secondary to menorrhagia for which she received 1 unit of PRBC transfusion however hemoglobin once again dropped to 6.9 again yesterday for which she received another blood transfusion of 1 unit, hemoglobin is 7.9 today and her menorrhagia is improving.  She is  stable for discharge.  Highly recommended that she needs to follow-up with her gynecologist.   Hyperkalemia: Potassium 6.0.  This is also a recurrent issue.  Patient received calcium  gluconate, albuterol , dextrose , Lokelma  and Lasix  in the ED, yet patient's potassium  was 6.0 yesterday for which I started her on Lokelma  twice daily and her potassium is normal today.  I am discontinuing her losartan .   Chest pain/elevated troponin/GERD: Troponin slightly elevated and essentially flat, likely secondary demand ischemia in the setting of anemia however due to history of diastolic congestive heart failure, repeat echo was completed which showed normal ejection fraction, rule out wall motion abnormality, showed grade 2 diastolic dysfunction which is slightly worse than the previous echo done in February 2025 which showed grade 1 diastolic dysfunction.  Patient appears euvolemic.  Follow-up with cardiology as outpatient.   Nausea/vomiting/dehydration/chronic mild metabolic acidosis: Creatinine slightly elevated but did not meet criteria for AKI.  Reviewed chart extensively, she has varying level of creatinine in the past.  Presented with CO2 of 17 which also appears to be chronic and her baseline is always slightly lower than normal.  She was given IV fluids.  Creatinine has improved and at baseline now.   Essential hypertension: Patient appears to be on metoprolol  and losartan  PTA.  Due to recurrent hyperkalemia, we held losartan , resumed metoprolol .  Blood pressure was rising so we started her on amlodipine  5 mg, now discharging on 10 mg of amlodipine  and metoprolol .   Type 2 diabetes mellitus: On insulin  pump.    Discharge plan was discussed with patient and/or family member and they verbalized understanding and agreed with it.  Discharge Diagnoses:  Active Problems:   Iron  deficiency anemia due to chronic blood loss    Discharge Instructions   Allergies as of 03/22/2024       Reactions    Trulicity  [dulaglutide ] Diarrhea        Medication List     STOP taking these medications    acetaZOLAMIDE ER 500 MG capsule Commonly known as: DIAMOX   losartan  25 MG tablet Commonly known as: COZAAR        TAKE these medications    acetaminophen  500 MG tablet Commonly known as: TYLENOL  Take 1,000 mg by mouth every 6 (six) hours as needed for mild pain (pain score 1-3) or moderate pain (pain score 4-6).   amitriptyline  10 MG tablet Commonly known as: ELAVIL  Take 10 mg by mouth at bedtime.   amLODipine  10 MG tablet Commonly known as: NORVASC  Take 1 tablet (10 mg total) by mouth daily.   ascorbic acid  500 MG tablet Commonly known as: VITAMIN C  Take 500 mg by mouth in the morning.   brimonidine  0.2 % ophthalmic solution Commonly known as: ALPHAGAN  Place 1 drop into the right eye 3 (three) times daily.   celecoxib  100 MG capsule Commonly known as: CELEBREX  Take 100 mg by mouth in the morning.   Cyanocobalamin  2500 MCG Tabs Take 2,500 mcg by mouth daily.   Emgality  120 MG/ML Soaj Generic drug:  Galcanezumab -gnlm Inject 120 mg into the skin every 30 (thirty) days.   ferrous sulfate  325 (65 FE) MG tablet Take 1 tablet (325 mg total) by mouth daily with breakfast.   fluconazole  150 MG tablet Commonly known as: DIFLUCAN  Take 150 mg by mouth once.   fluticasone  50 MCG/ACT nasal spray Commonly known as: FLONASE  Place 2 sprays into both nostrils daily. What changed:  how much to take when to take this reasons to take this   furosemide  20 MG tablet Commonly known as: LASIX  TAKE 1 TABLET(20 MG) BY MOUTH IN THE MORNING   gabapentin  800 MG tablet Commonly known as: NEURONTIN  Take 800 mg by mouth 3 (three) times daily.   HumaLOG  100 UNIT/ML injection Generic drug: insulin  lispro Refill insulin  pump as directed   latanoprost  0.005 % ophthalmic solution Commonly known as: XALATAN  Place 1 drop into the right eye at bedtime.   levofloxacin  750 MG  tablet Commonly known as: LEVAQUIN  Take 750 mg by mouth daily.   metoprolol  tartrate 25 MG tablet Commonly known as: LOPRESSOR  Take 1 tablet (25 mg total) by mouth 2 (two) times daily.   multivitamin with minerals Tabs tablet Take 1 tablet by mouth daily.   Nurtec 75 MG Tbdp Generic drug: Rimegepant Sulfate Take 1 tablet (75 mg total) by mouth as needed. Take 1 tablet at onset of headache, max is 1 tablet in 24 hours.   Omnipod 5 G7 Pods (Gen 5) Misc 1.6 Units/hr by Implant route continuous.   oxyCODONE -acetaminophen  7.5-325 MG tablet Commonly known as: PERCOCET Take 1 tablet by mouth 4 (four) times daily as needed for moderate pain (pain score 4-6).   pantoprazole  40 MG tablet Commonly known as: PROTONIX  Take 1 tablet (40 mg total) by mouth daily.   prednisoLONE  acetate 1 % ophthalmic suspension Commonly known as: PRED FORTE  Place 1 drop into the right eye in the morning, at noon, and at bedtime.   predniSONE  10 MG tablet Commonly known as: DELTASONE  Take 1 tablet (10 mg total) by mouth daily with breakfast.   tiZANidine  4 MG tablet Commonly known as: ZANAFLEX  Take 4 mg by mouth every 12 (twelve) hours as needed for muscle spasms.   Ventolin  HFA 108 (90 Base) MCG/ACT inhaler Generic drug: albuterol  Inhale 2 puffs into the lungs every 6 (six) hours as needed for wheezing or shortness of breath.        Follow-up Information     [name], NP Follow up.   Specialty: Nurse Practitioner Contact information: [address] [address] [place] [address] [phone]                Allergies[1]  Consultations: None   Procedures/Studies: ECHOCARDIOGRAM COMPLETE Result Date: 03/21/2024    ECHOCARDIOGRAM REPORT   Patient Name:   Melinda Stone [facility] Date of Exam: 03/21/2024 Medical Rec #:  [id number]              Height:       69.0 in Accession #:    [id number]             Weight:       231.9 lb Date of Birth:  [birth year]              BSA:          2.200 m Patient  Age:    43 years               BP:           184/92 mmHg  Patient Gender: F                      HR:           80 bpm. Exam Location:  Inpatient Procedure: 2D Echo, Cardiac Doppler and Color Doppler (Both Spectral and Color            Flow Doppler were utilized during procedure). Indications:    Elevated Troponin  History:        Patient has prior history of Echocardiogram examinations, most                 recent 05/13/2023. Risk Factors:Diabetes and Hypertension.  Sonographer:    Tinnie Gosling RDCS Referring Phys: [id number] Fidencia Mccloud IMPRESSIONS  1. Left ventricular ejection fraction, by estimation, is 60 to 65%. The left ventricle has normal function. The left ventricle has no regional wall motion abnormalities. There is mild concentric left ventricular hypertrophy. Left ventricular diastolic parameters are consistent with Grade II diastolic dysfunction (pseudonormalization). Elevated left atrial pressure.  2. Right ventricular systolic function is normal. The right ventricular size is normal. Tricuspid regurgitation signal is inadequate for assessing PA pressure.  3. Left atrial size was mildly dilated.  4. The mitral valve is normal in structure. Trivial mitral valve regurgitation. No evidence of mitral stenosis.  5. The aortic valve is normal in structure. Aortic valve regurgitation is not visualized. No aortic stenosis is present.  6. The inferior vena cava is dilated in size with <50% respiratory variability, suggesting right atrial pressure of 15 mmHg. FINDINGS  Left Ventricle: Left ventricular ejection fraction, by estimation, is 60 to 65%. The left ventricle has normal function. The left ventricle has no regional wall motion abnormalities. The left ventricular internal cavity size was normal in size. There is  mild concentric left ventricular hypertrophy. Left ventricular diastolic parameters are consistent with Grade II diastolic dysfunction (pseudonormalization). Elevated left atrial pressure. Right  Ventricle: The right ventricular size is normal. No increase in right ventricular wall thickness. Right ventricular systolic function is normal. Tricuspid regurgitation signal is inadequate for assessing PA pressure. Left Atrium: Left atrial size was mildly dilated. Right Atrium: Right atrial size was normal in size. Pericardium: There is no evidence of pericardial effusion. Mitral Valve: The mitral valve is normal in structure. Trivial mitral valve regurgitation. No evidence of mitral valve stenosis. Tricuspid Valve: The tricuspid valve is normal in structure. Tricuspid valve regurgitation is trivial. No evidence of tricuspid stenosis. Aortic Valve: The aortic valve is normal in structure. Aortic valve regurgitation is not visualized. No aortic stenosis is present. Pulmonic Valve: The pulmonic valve was normal in structure. Pulmonic valve regurgitation is trivial. No evidence of pulmonic stenosis. Aorta: The aortic root is normal in size and structure. Venous: The inferior vena cava is dilated in size with less than 50% respiratory variability, suggesting right atrial pressure of 15 mmHg. IAS/Shunts: No atrial level shunt detected by color flow Doppler.  LEFT VENTRICLE PLAX 2D LVIDd:         4.30 cm      Diastology LVIDs:         2.80 cm      LV e' medial:    7.40 cm/s LV PW:         1.20 cm      LV E/e' medial:  14.7 LV IVS:        1.20 cm      LV e' lateral:   8.38 cm/s LVOT diam:  2.10 cm      LV E/e' lateral: 13.0 LV SV:         95 LV SV Index:   43 LVOT Area:     3.46 cm LV IVRT:       85 msec  LV Volumes (MOD) LV vol d, MOD A2C: 129.0 ml LV vol d, MOD A4C: 155.0 ml LV vol s, MOD A2C: 41.4 ml LV vol s, MOD A4C: 62.0 ml LV SV MOD A2C:     87.6 ml LV SV MOD A4C:     155.0 ml LV SV MOD BP:      93.3 ml RIGHT VENTRICLE             IVC RV S prime:     13.10 cm/s  IVC diam: 2.25 cm TAPSE (M-mode): 2.4 cm                             PULMONARY VEINS                             Diastolic Velocity: 50.60 cm/s                              S/D Velocity:       1.20                             Systolic Velocity:  62.00 cm/s LEFT ATRIUM             Index        RIGHT ATRIUM           Index LA diam:        4.07 cm 1.85 cm/m   RA Area:     14.50 cm LA Vol (A2C):   67.6 ml 30.72 ml/m  RA Volume:   33.70 ml  15.32 ml/m LA Vol (A4C):   77.1 ml 35.04 ml/m LA Biplane Vol: 76.8 ml 34.91 ml/m  AORTIC VALVE LVOT Vmax:   115.00 cm/s LVOT Vmean:  79.800 cm/s LVOT VTI:    0.274 m  AORTA Ao Root diam: 2.82 cm Ao Asc diam:  3.31 cm MITRAL VALVE MV Area (PHT): 4.33 cm     SHUNTS MV Decel Time: 175 msec     Systemic VTI:  0.27 m MV E velocity: 109.00 cm/s  Systemic Diam: 2.10 cm MV A velocity: 144.00 cm/s MV E/A ratio:  0.76 Wilbert Bihari MD Electronically signed by Wilbert Bihari MD Signature Date/Time: 03/21/2024/4:08:10 PM    Final      Discharge Exam: Vitals:   03/22/24 0454 03/22/24 0753  BP: (!) 163/79 (!) 166/79  Pulse: 77 80  Resp: 18 17  Temp: 97.9 F (36.6 C) 98.2 F (36.8 C)  SpO2: 97% 100%   Vitals:   03/21/24 1634 03/21/24 2015 03/22/24 0454 03/22/24 0753  BP: (!) 161/88 (!) 174/90 (!) 163/79 (!) 166/79  Pulse: 80 85 77 80  Resp: 18 18 18 17   Temp: 98.1 F (36.7 C) 98 F (36.7 C) 97.9 F (36.6 C) 98.2 F (36.8 C)  TempSrc:      SpO2: 100% 100% 97% 100%  Weight:      Height:        General: Pt is alert, awake, not in acute distress Cardiovascular: RRR, S1/S2 +,  no rubs, no gallops Respiratory: CTA bilaterally, no wheezing, no rhonchi Abdominal: Soft, NT, ND, bowel sounds + Extremities: no edema, no cyanosis    The results of significant diagnostics from this hospitalization (including imaging, microbiology, ancillary and laboratory) are listed below for reference.     Microbiology: No results found for this or any previous visit (from the past 240 hours).   Labs: BNP (last 3 results) Recent Labs    04/21/23 1147 05/11/23 2028 10/28/23 1557  BNP 238.5* 1,082.1* 204.2*   Basic  Metabolic Panel: Recent Labs  Lab 03/20/24 1358 03/21/24 0230 03/21/24 1659 03/22/24 0554  NA 138 138 134* 134*  K 6.0* 6.0* 6.0* 5.1  CL 114* 113* 109 109  CO2 17* 19* 19* 17*  GLUCOSE 194* 222* 238* 242*  BUN 22* 21* 25* 28*  CREATININE 1.09* 1.07* 1.16* 1.11*  CALCIUM  8.1* 7.8* 8.0* 8.2*   Liver Function Tests: No results for input(s): AST, ALT, ALKPHOS, BILITOT, PROT, ALBUMIN in the last 168 hours. No results for input(s): LIPASE, AMYLASE in the last 168 hours. No results for input(s): AMMONIA in the last 168 hours. CBC: Recent Labs  Lab 03/16/24 1303 03/20/24 1218 03/21/24 0230 03/21/24 1659 03/22/24 0554  WBC 11.1* 9.2 8.4 9.4 7.6  NEUTROABS 8.3* 7.4  --  7.9* 5.8  HGB 8.3* 6.9* 6.9* 8.5* 7.9*  HCT 23.7* 20.4* 20.5* 23.8* 22.9*  MCV 80.9 84.6 86.9 82.9 84.5  PLT 132* 161 137* 144* 131*   Cardiac Enzymes: No results for input(s): CKTOTAL, CKMB, CKMBINDEX, TROPONINI in the last 168 hours. BNP: Invalid input(s): POCBNP CBG: Recent Labs  Lab 03/21/24 1632 03/21/24 1857 03/21/24 1939 03/21/24 2301 03/22/24 0752  GLUCAP 264* 122* 89 192* 217*   D-Dimer No results for input(s): DDIMER in the last 72 hours. Hgb A1c No results for input(s): HGBA1C in the last 72 hours. Lipid Profile No results for input(s): CHOL, HDL, LDLCALC, TRIG, CHOLHDL, LDLDIRECT in the last 72 hours. Thyroid  function studies No results for input(s): TSH, T4TOTAL, T3FREE, THYROIDAB in the last 72 hours.  Invalid input(s): FREET3 Anemia work up No results for input(s): VITAMINB12, FOLATE, FERRITIN, TIBC, IRON , RETICCTPCT in the last 72 hours. Urinalysis    Component Value Date/Time   COLORURINE RED (A) 03/20/2024 1202   APPEARANCEUR TURBID (A) 03/20/2024 1202   LABSPEC 1.025 03/20/2024 1202   PHURINE 5.0 03/20/2024 1202   GLUCOSEU (A) 03/20/2024 1202    TEST NOT REPORTED DUE TO COLOR INTERFERENCE OF URINE PIGMENT    HGBUR (A) 03/20/2024 1202    TEST NOT REPORTED DUE TO COLOR INTERFERENCE OF URINE PIGMENT   BILIRUBINUR (A) 03/20/2024 1202    TEST NOT REPORTED DUE TO COLOR INTERFERENCE OF URINE PIGMENT   BILIRUBINUR negative 07/06/2023 1033   KETONESUR (A) 03/20/2024 1202    TEST NOT REPORTED DUE TO COLOR INTERFERENCE OF URINE PIGMENT   PROTEINUR (A) 03/20/2024 1202    TEST NOT REPORTED DUE TO COLOR INTERFERENCE OF URINE PIGMENT   UROBILINOGEN 0.2 07/06/2023 1033   NITRITE (A) 03/20/2024 1202    TEST NOT REPORTED DUE TO COLOR INTERFERENCE OF URINE PIGMENT   LEUKOCYTESUR (A) 03/20/2024 1202    TEST NOT REPORTED DUE TO COLOR INTERFERENCE OF URINE PIGMENT   Sepsis Labs Recent Labs  Lab 03/20/24 1218 03/21/24 0230 03/21/24 1659 03/22/24 0554  WBC 9.2 8.4 9.4 7.6   Microbiology No results found for this or any previous visit (from the past 240 hours).  FURTHER DISCHARGE INSTRUCTIONS:  Get Medicines reviewed and adjusted: Please take all your medications with you for your next visit with your Primary MD   Laboratory/radiological data: Please request your Primary MD to go over all hospital tests and procedure/radiological results at the follow up, please ask your Primary MD to get all Hospital records sent to his/her office.   In some cases, they will be blood work, cultures and biopsy results pending at the time of your discharge. Please request that your primary care M.D. goes through all the records of your hospital data and follows up on these results.   Also Note the following: If you experience worsening of your admission symptoms, develop shortness of breath, life threatening emergency, suicidal or homicidal thoughts you must seek medical attention immediately by calling 911 or calling your MD immediately  if symptoms less severe.   You must read complete instructions/literature along with all the possible adverse reactions/side effects for all the Medicines you take and that have been  prescribed to you. Take any new Medicines after you have completely understood and accpet all the possible adverse reactions/side effects.    patient was instructed, not to drive, operate heavy machinery, perform activities at heights, swimming or participation in water  activities or provide baby-sitting services while on Pain, Sleep and Anxiety Medications; until their outpatient Physician has advised to do so again. Also recommended to not to take more than prescribed Pain, Sleep and Anxiety Medications.  It is not advisable to combine anxiety, sleep and pain medications without talking with your primary care provider.     Wear Seat belts while driving.   Please note: You were cared for by a hospitalist during your hospital stay. Once you are discharged, your primary care physician will handle any further medical issues. Please note that NO REFILLS for any discharge medications will be authorized once you are discharged, as it is imperative that you return to your primary care physician (or establish a relationship with a primary care physician if you do not have one) for your post hospital discharge needs so that they can reassess your need for medications and monitor your lab values  Time coordinating discharge: Over 30 minutes  SIGNED:   Fredia Skeeter, MD  [facility] 03/22/2024, 10:21 AM *Please note that this is a verbal dictation therefore any spelling or grammatical errors are due to the Dragon Medical One system interpretation. If 7PM-7AM, please contact night-coverage [email or web address]     [1]  Allergies Allergen Reactions   Trulicity  [Dulaglutide ] Diarrhea

## 2024-03-22 NOTE — Plan of Care (Signed)

## 2024-03-31 NOTE — Progress Notes (Unsigned)
 "   Name: Melinda Stone  Age/ Sex: 44 y.o., female   MRN/ DOB: [id number], 1980-05-10     PCP: [name], NP   Reason for Endocrinology Evaluation: Type 2 Diabetes Mellitus  Initial Endocrine Consultative Visit: 12/03/2018    PATIENT IDENTIFIER: Melinda Stone is a 44 y.o. female with a past medical history of T2DM. The patient has followed with Endocrinology clinic since 12/03/2018 for consultative assistance with management of her diabetes.  DIABETIC HISTORY:  Melinda Stone was diagnosed with DMat age 38. Metformin  had caused GI issues and hypoglycemia. Her hemoglobin A1c has ranged from 7.0% in 04/2018, peaking at 7.8% in 10/2018  On her initial visit to our clinic she had an A1c of 7.8%. She was on Jardiance , she had a prescription for Trulicity  and Jardiance  were too costly for her. We added glipizide  .    Atteted to  Jardiance  09/2020 but she did not pick it up  Trulicity  started 2022 but by 08/2021 she developed GI side effects and we stopped  Developed abdominal pain to Ozempic  09/2022  Jardiance  discontinued due to DKA in 2024   She was discharged on insulin  after hospitalization for complicated pneumonia requiring intubation and ICU admission 04/2023   I had discontinued glipizide  and started her on prandial dose of insulin  in July, 2025 with an A1c of 8.7%  I did prescribe OmniPod in July, 2025  She was referred to nephrology for elevated MA/CR ratio in July, 2025  The patient was trained on OmniPod use November, 2025   SUBJECTIVE:   During the last visit (12/27/2023): A1c 7.8% .    Today (04/01/2024): Melinda Stone is here for a follow up on diabetes.  She checks her blood sugars multiple times daily through freestyle libre.     The patient has been following up with ophthalmology for glaucoma and retinopathy  The patient required ED visits for hyperkalemia 6.6, and anemia (H/H 7.5/22.4) that was multifactorial  The patient required  transfusion in December, 2025, followed by worsening hyperkalemia.  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 During the visit the pump basal and bolus doses were reviewed including carb/insulin  rations and supplemental doses. The clinical list was updated. The glucose meter download was reviewed in detail to determine if the current pump settings are providing the best glycemic control without excessive hypoglycemia.  Pump and CGM download:    Pump   OmniPod Settings   Insulin  type   Humalog    Basal rate       0000 1.5 u/h               I:C ratio       0000 1:1    #6g/3g              Sensitivity       0000  25      Goal       0000  70-110             Type & Model of Pump: OmniPod Insulin  Type: Currently using Humalog .  Body mass index is 33.52 kg/m.  PUMP/CGM STATISTICS: Average BG: 156  Average Daily Carbs (g): 7  Average Total Daily Insulin : 24.3 Average Daily Basal: 21.4 (88 %) Average Daily Bolus: 2.9 (12 %)  HOME DIABETES REGIMEN:  Humalog     CONTINUOUS GLUCOSE MONITORING RECORD INTERPRETATION    Dates of Recording: 12/31-1/14/2026  Sensor description:dexcom  Results statistics:   CGM use % of time 34.7  Average and SD 156/29  Time in range 79%  % Time Above 180 21  % Time above 250 0  % Time Below target 0   Glycemic patterns summary: BGs are optimal overnight and most of the day Hyperglycemic episodes postprandial  Hypoglycemic episodes occurred N/A  Overnight periods: Optimal   DIABETIC COMPLICATIONS: Microvascular complications:   B/L retinopathy , S/P injections  Denies: CKD, neuropathy  Last eye exam: Completed 02/05/2024   Macrovascular complications:    Denies: CAD, PVD, CVA   HISTORY:  Past Medical History:  Past Medical  History:  Diagnosis Date   Anemia    Arthritis 08/17/2020   Chronic back pain    Diabetes mellitus without complication (HCC)    type 2   GERD (gastroesophageal reflux disease)    Hypertension    Migraine    Ovarian cyst    Pneumonia    Polyneuropathy    Past Surgical History:  Past Surgical History:  Procedure Laterality Date   CATARACT EXTRACTION Right 11/08/2023   CESAREAN SECTION     x4   CHOLECYSTECTOMY  2004   EYE SURGERY Left    cataract   EYE SURGERY Left 10/2023   retinal surgery x2   HYSTEROSCOPY N/A 10/01/2023   Procedure: ABLATION, ENDOMETRIUM, HYSTEROSCOPIC;  Surgeon: Alger Gong, MD;  Location: [facility] OR;  Service: Gynecology;  Laterality: N/A;   IR BONE MARROW BIOPSY & ASPIRATION  08/01/2023   LUMBAR LAMINECTOMY N/A 05/30/2020   Procedure: Lumbar five Laminectomy,  Bilateral Microdiscectomy, Left Lumbar five -Sacral one Microdiscectomy;  Surgeon: Barbarann Oneil BROCKS, MD;  Location: [facility] OR;  Service: Orthopedics;  Laterality: N/A;   LUMBAR LAMINECTOMY Left 11/14/2020   Procedure: LEFT LUMBAR FOUR-FIVE MICRODISCECTOMY;  Surgeon: Barbarann Oneil BROCKS, MD;  Location: [facility] OR;  Service: Orthopedics;  Laterality: Left;   TUBAL LIGATION  10/19/2010   Social History:  reports that she has been smoking cigars. She has never been exposed to tobacco smoke. She has never used smokeless tobacco. She reports that she does not currently use alcohol . She reports that she does not currently use drugs. Family History:  Family History  Problem Relation Age of Onset   Diabetes Mother    Hypertension Mother    Stroke Mother    Healthy Sister    Hypertension Brother    Asthma Brother    Hypertension Brother    Healthy Son    Healthy Daughter    Healthy Daughter      HOME MEDICATIONS: Allergies as of 04/01/2024       Reactions   Trulicity  [dulaglutide ] Diarrhea        Medication List        Accurate as of April 01, 2024  8:35 AM. If you have any questions, ask your nurse or  doctor.          acetaminophen  500 MG tablet Commonly known as: TYLENOL  Take 1,000 mg by mouth every 6 (six) hours as needed for mild pain (pain score 1-3) or moderate pain (pain score 4-6).   amitriptyline  10 MG tablet Commonly known as: ELAVIL  Take 10 mg by mouth at bedtime.   amLODipine  10 MG tablet Commonly known as: NORVASC  Take 1 tablet (10 mg total) by mouth daily.   ascorbic acid  500 MG tablet Commonly known  as: VITAMIN C  Take 500 mg by mouth in the morning.   brimonidine  0.2 % ophthalmic solution Commonly known as: ALPHAGAN  Place 1 drop into the right eye 3 (three) times daily.   celecoxib  100 MG capsule Commonly known as: CELEBREX  Take 100 mg by mouth in the morning.   Cyanocobalamin  2500 MCG Tabs Take 2,500 mcg by mouth daily.   Emgality  120 MG/ML Soaj Generic drug: Galcanezumab -gnlm Inject 120 mg into the skin every 30 (thirty) days.   ferrous sulfate  325 (65 FE) MG tablet Take 1 tablet (325 mg total) by mouth daily with breakfast.   fluconazole  150 MG tablet Commonly known as: DIFLUCAN  Take 150 mg by mouth once.   fluticasone  50 MCG/ACT nasal spray Commonly known as: FLONASE  Place 2 sprays into both nostrils daily. What changed:  how much to take when to take this reasons to take this   furosemide  20 MG tablet Commonly known as: LASIX  TAKE 1 TABLET(20 MG) BY MOUTH IN THE MORNING   gabapentin  800 MG tablet Commonly known as: NEURONTIN  Take 800 mg by mouth 3 (three) times daily.   HumaLOG  100 UNIT/ML injection Generic drug: insulin  lispro Refill insulin  pump as directed   latanoprost  0.005 % ophthalmic solution Commonly known as: XALATAN  Place 1 drop into the right eye at bedtime.   levofloxacin  750 MG tablet Commonly known as: LEVAQUIN  Take 750 mg by mouth daily.   metoprolol  tartrate 25 MG tablet Commonly known as: LOPRESSOR  Take 1 tablet (25 mg total) by mouth 2 (two) times daily.   multivitamin with minerals Tabs tablet Take  1 tablet by mouth daily.   Nurtec 75 MG Tbdp Generic drug: Rimegepant Sulfate Take 1 tablet (75 mg total) by mouth as needed. Take 1 tablet at onset of headache, max is 1 tablet in 24 hours.   Omnipod 5 G7 Pods (Gen 5) Misc 1.6 Units/hr by Implant route continuous.   oxyCODONE -acetaminophen  7.5-325 MG tablet Commonly known as: PERCOCET Take 1 tablet by mouth 4 (four) times daily as needed for moderate pain (pain score 4-6).   pantoprazole  40 MG tablet Commonly known as: PROTONIX  Take 1 tablet (40 mg total) by mouth daily.   prednisoLONE  acetate 1 % ophthalmic suspension Commonly known as: PRED FORTE  Place 1 drop into the right eye in the morning, at noon, and at bedtime.   predniSONE  10 MG tablet Commonly known as: DELTASONE  Take 1 tablet (10 mg total) by mouth daily with breakfast.   tiZANidine  4 MG tablet Commonly known as: ZANAFLEX  Take 4 mg by mouth every 12 (twelve) hours as needed for muscle spasms.   Ventolin  HFA 108 (90 Base) MCG/ACT inhaler Generic drug: albuterol  Inhale 2 puffs into the lungs every 6 (six) hours as needed for wheezing or shortness of breath.         OBJECTIVE:   Vital Signs: BP 136/68   Pulse 64   Resp 18   Ht 5' 9 (1.753 m)   Wt 227 lb (103 kg)   SpO2 98%   BMI 33.52 kg/m   Wt Readings from Last 3 Encounters:  04/01/24 227 lb (103 kg)  03/20/24 231 lb 14.8 oz (105.2 kg)  03/16/24 231 lb 14.4 oz (105.2 kg)     Exam: General: Pt appears drowsy, c/o arthralgia   Lungs: Clear with good BS bilat   Heart: RRR   Extremities: Trace  pretibial edema.   Neuro: MS is good with appropriate affect, pt is alert and Ox3    DM foot  exam: 04/01/2024 The skin of the feet is intact without sores or ulcerations. The pedal pulses are 2+ on right and 2+ on left. The sensation is absent to a screening 5.07, 10 gram monofilament bilaterally     DATA REVIEWED:  Lab Results  Component Value Date   HGBA1C 5.9 (A) 04/01/2024   HGBA1C 7.8 (A)  12/27/2023   HGBA1C 7.5 (H) 10/01/2023    Latest Reference Range & Units 03/22/24 05:54  Sodium 135 - 145 mmol/L 134 (L)  Potassium 3.5 - 5.1 mmol/L 5.1  Chloride 98 - 111 mmol/L 109  CO2 22 - 32 mmol/L 17 (L)  Glucose 70 - 99 mg/dL 757 (H)  BUN 6 - 20 mg/dL 28 (H)  Creatinine 9.55 - 1.00 mg/dL 8.88 (H)  Calcium  8.9 - 10.3 mg/dL 8.2 (L)  Anion gap 5 - 15  8  GFR, Estimated >60 mL/min >60    ASSESSMENT / PLAN / RECOMMENDATIONS:   1) Type 2 Diabetes Mellitus, Optimally controlled, 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 Goal A1c < 7.0 %.    -A1c is skewed due to blood transfusion -Intolerant to Trulicity  and Ozempic  due to abdominal pain -Developed DKA on Jardiance  2024 -She has been on insulin  since 04/2023 following hospitalization - Her BG readings have optimized on the OmniPod, she has been noted with hypoglycemia/tight BG's on CGM/pump download.  I will decrease her basal rate as below -Patient encouraged to continue with bolusing with meals as well as snacks when necessary    MEDICATIONS:    Pump   OmniPod Settings   Insulin  type   Humalog    Basal rate       0000 1.45 u/h               I:C ratio       0000 1:1    #6g/3g              Sensitivity       0000  25      Goal       0000  120          EDUCATION / INSTRUCTIONS: BG monitoring instructions: Patient is instructed to check her blood sugars 2 times a day, fasting and supper time. Call [facility] if: BG persistently < 70  I reviewed the Rule of 15 for the treatment of hypoglycemia in detail with the patient. Literature supplied.    F/U in 4 months    Signed electronically by: Stefano Redgie Butts, MD  [facility]  [facility] [address] [place] [place], [place] [address] Phone: [phone] FAX: [phone]   CC: [name], NP [address] [address] [place] [address] Phone:  [phone]  Fax: [phone]  Return to Endocrinology clinic as below: Future Appointments  Date Time Provider Department Center  04/02/2024 10:30 AM [facility] None  04/02/2024 11:00 AM Loretha Ash, MD [facility] None  04/07/2024  4:10 PM Nicholaus Burnard HERO, MD [facility] None  04/21/2024  8:45 AM Gayland Lauraine PARAS, NP [facility] None  04/24/2024  9:00 AM Tamea Dedra CROME, MD [facility] [id number]    "

## 2024-04-01 ENCOUNTER — Ambulatory Visit (INDEPENDENT_AMBULATORY_CARE_PROVIDER_SITE_OTHER): Admitting: Internal Medicine

## 2024-04-01 ENCOUNTER — Encounter: Payer: Self-pay | Admitting: Internal Medicine

## 2024-04-01 VITALS — BP 136/68 | HR 64 | Resp 18 | Ht 69.0 in | Wt 227.0 lb

## 2024-04-01 DIAGNOSIS — E1142 Type 2 diabetes mellitus with diabetic polyneuropathy: Secondary | ICD-10-CM | POA: Diagnosis not present

## 2024-04-01 DIAGNOSIS — Z794 Long term (current) use of insulin: Secondary | ICD-10-CM | POA: Diagnosis not present

## 2024-04-01 DIAGNOSIS — E1129 Type 2 diabetes mellitus with other diabetic kidney complication: Secondary | ICD-10-CM | POA: Diagnosis not present

## 2024-04-01 DIAGNOSIS — R809 Proteinuria, unspecified: Secondary | ICD-10-CM

## 2024-04-01 DIAGNOSIS — E11319 Type 2 diabetes mellitus with unspecified diabetic retinopathy without macular edema: Secondary | ICD-10-CM | POA: Diagnosis not present

## 2024-04-01 LAB — POCT GLYCOSYLATED HEMOGLOBIN (HGB A1C): Hemoglobin A1C: 5.9 % — AB (ref 4.0–5.6)

## 2024-04-01 NOTE — Patient Instructions (Signed)
 Enter 6 g of carbohydrates with each meal and 3 g of carbohydrate with snacks   HOW TO TREAT LOW BLOOD SUGARS (Blood sugar LESS THAN 70 MG/DL) Please follow the RULE OF 15 for the treatment of hypoglycemia treatment (when your (blood sugars are less than 70 mg/dL)   STEP 1: Take 15 grams of carbohydrates when your blood sugar is low, which includes:  3-4 GLUCOSE TABS  OR 3-4 OZ OF JUICE OR REGULAR SODA OR ONE TUBE OF GLUCOSE GEL    STEP 2: RECHECK blood sugar in 15 MINUTES STEP 3: If your blood sugar is still low at the 15 minute recheck --> then, go back to STEP 1 and treat AGAIN with another 15 grams of carbohydrates.

## 2024-04-02 ENCOUNTER — Inpatient Hospital Stay: Admitting: Hematology and Oncology

## 2024-04-02 ENCOUNTER — Inpatient Hospital Stay: Attending: Oncology

## 2024-04-02 VITALS — BP 119/59 | HR 76 | Temp 97.6°F | Resp 17 | Wt 227.0 lb

## 2024-04-02 DIAGNOSIS — D519 Vitamin B12 deficiency anemia, unspecified: Secondary | ICD-10-CM

## 2024-04-02 DIAGNOSIS — D649 Anemia, unspecified: Secondary | ICD-10-CM

## 2024-04-02 LAB — CBC WITH DIFFERENTIAL/PLATELET
Abs Immature Granulocytes: 0.03 K/uL (ref 0.00–0.07)
Basophils Absolute: 0.1 K/uL (ref 0.0–0.1)
Basophils Relative: 1 %
Eosinophils Absolute: 0 K/uL (ref 0.0–0.5)
Eosinophils Relative: 0 %
HCT: 24.2 % — ABNORMAL LOW (ref 36.0–46.0)
Hemoglobin: 8.5 g/dL — ABNORMAL LOW (ref 12.0–15.0)
Immature Granulocytes: 0 %
Lymphocytes Relative: 7 %
Lymphs Abs: 0.7 K/uL (ref 0.7–4.0)
MCH: 29.6 pg (ref 26.0–34.0)
MCHC: 35.1 g/dL (ref 30.0–36.0)
MCV: 84.3 fL (ref 80.0–100.0)
Monocytes Absolute: 0.3 K/uL (ref 0.1–1.0)
Monocytes Relative: 2 %
Neutro Abs: 9.3 K/uL — ABNORMAL HIGH (ref 1.7–7.7)
Neutrophils Relative %: 90 %
Platelets: 137 K/uL — ABNORMAL LOW (ref 150–400)
RBC: 2.87 MIL/uL — ABNORMAL LOW (ref 3.87–5.11)
RDW: 18.6 % — ABNORMAL HIGH (ref 11.5–15.5)
WBC: 10.4 K/uL (ref 4.0–10.5)
nRBC: 0 % (ref 0.0–0.2)

## 2024-04-02 MED ORDER — PREDNISONE 5 MG PO TABS: 5.0000 mg | ORAL_TABLET | Freq: Every day | ORAL | 0 refills | Status: AC

## 2024-04-02 NOTE — Progress Notes (Signed)
 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  Hemoglobin stable without transfusion.  Reticulocyte count suggests hemolysis has slowed down.  - Prescribed prednisone  taper: two tablets daily for one week, then one tablet daily for one week,  - upon review of labs today, No transfusion needed. Responsive to prednisone  taper. - Continue prednisone  5 mg daily for 1-2 weeks, then discontinue. - Order follow-up labs in two weeks. - Provide prescription for two weeks of prednisone . - Instruct to call if symptoms worsen or concerns arise.   Weight HISTORY OF PRESENTING ILLNESS:  Melinda Stone 44 y.o. female is here because of hemolytic anemia  This is followed by hematology over the past several years patient has been extensively evaluated for underlying hemolytic anemia. Very complex history, multiple hospitalization at times for blood loss anemia and some times for possible hemolysis. No evidence of nutritional deficiency She did have episodes of hemolysis in the past but not classic hemolysis and required blood transfusion, DAT neg, no cold agglutinin. She responded to steroids however she didn't tolerate them well, hence discontinued. Previous bone marrow biopsy showed Slightly hypercellular bone marrow with erythroid hyperplasia, No viral cytopathic changes or significant  dyspoiesis. Suspect secondary process. Normal cytogenetics, normal myeloid NGS  MPN testing negative. CT chest abdomen pelvis with no lymphoproliferative disease. She says she wanted a second opinion since she doesn't seem to know why she is anemic.  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  She recently transitioned to prednisone  5 mg daily within the past week after a higher dose regimen. Hb today is 8.5 x10^3/L.  She denies bleeding and reports no symptoms of dyspnea, abdominal pain, dysuria, or constipation. Bowel habits are normal and there are no genitourinary complaints. Rest of the pertinent 10 point ROS reviewed and neg  MEDICAL HISTORY:  Past Medical History:  Diagnosis Date   Anemia    Arthritis 08/17/2020   Chronic back pain    Diabetes mellitus without complication (HCC)    type 2   GERD (gastroesophageal reflux disease)    Hypertension    Migraine    Ovarian cyst    Pneumonia    Polyneuropathy     SURGICAL HISTORY: Past Surgical History:  Procedure Laterality Date   CATARACT EXTRACTION Right 11/08/2023   CESAREAN SECTION     x4   CHOLECYSTECTOMY  2004   EYE SURGERY Left    cataract   EYE SURGERY Left 10/2023   retinal surgery x2   HYSTEROSCOPY N/A 10/01/2023   Procedure: ABLATION, ENDOMETRIUM, HYSTEROSCOPIC;  Surgeon: Alger Gong, MD;  Location: [facility] OR;  Service: Gynecology;  Laterality: N/A;   IR BONE MARROW BIOPSY & ASPIRATION  08/01/2023   LUMBAR LAMINECTOMY N/A 05/30/2020   Procedure: Lumbar five Laminectomy,  Bilateral Microdiscectomy, Left Lumbar five -Sacral one Microdiscectomy;  Surgeon: Barbarann Oneil BROCKS, MD;  Location: [facility] OR;  Service: Orthopedics;  Laterality: N/A;   LUMBAR LAMINECTOMY Left 11/14/2020   Procedure: LEFT LUMBAR FOUR-FIVE MICRODISCECTOMY;  Surgeon: Barbarann Oneil BROCKS, MD;  Location: [facility] OR;  Service:  Orthopedics;  Laterality: Left;   TUBAL LIGATION  10/19/2010    SOCIAL HISTORY: Social History   Socioeconomic History   Marital status: Single    Spouse name: Not on file   Number of children: 3   Years of education: Not on file   Highest education level: High school graduate  Occupational History    Comment: home maker  Tobacco Use   Smoking status: Every Day    Types: Cigars    Last attempt to quit: 12/23/2021    Years since quitting: 2.2    Passive exposure: Never   Smokeless tobacco: Never  Vaping Use   Vaping status: Never Used  Substance and Sexual Activity   Alcohol  use: Not Currently   Drug use: Not Currently    Comment: per patient stopped smoking marijuana Mar 2022   Sexual activity: Yes    Birth control/protection: Surgical  Other Topics Concern   Not on file  Social History Narrative   Lives with 3 children   Some coffee   Right handed   Doesn't work   Social Drivers of Health   Tobacco Use: High Risk (03/23/2024)   [facility], [facility], [facility]   Patient History    Smoking Tobacco Use: Former    Smokeless Tobacco Use: Current    Passive Exposure: Not on Actuary Strain: Not on file  Food Insecurity: No Food Insecurity (03/21/2024)   Epic    Worried About Radiation Protection Practitioner of Food in the Last Year: Never true    Ran Out of Food in the Last Year: Never true  Transportation Needs: No Transportation Needs (03/21/2024)   Epic    Lack of Transportation (Medical): No    Lack of Transportation (Non-Medical): No  Physical Activity: Not on file  Stress: Not on file  Social Connections: Moderately Isolated (07/31/2023)   Social Connection and Isolation Panel    Frequency of Communication with Friends and Family: Twice a week    Frequency of Social Gatherings with Friends and Family: Twice a week    Attends Religious Services: 1 to 4 times per year    Active Member of [employer] or Organizations: No    Attends Banker Meetings:  Never    Marital Status: Never married  Intimate Partner Violence: Not At Risk (03/21/2024)   Epic    Fear of Current or Ex-Partner: No    Emotionally Abused: No    Physically Abused: No    Sexually Abused: No  Depression (PHQ2-9): Low Risk (11/29/2023)   Depression (PHQ2-9)    PHQ-2 Score: 0  Alcohol  Screen: Not on file  Housing: Low Risk (03/21/2024)   Epic    Unable to Pay for Housing in the Last Year: No    Number of Times Moved in the Last Year: 0    Homeless in the Last Year: No  Utilities: Not At Risk (03/21/2024)   Epic    Threatened with loss of utilities: No  Health Literacy: Not on file    FAMILY HISTORY: Family History  Problem Relation Age of Onset   Diabetes Mother    Hypertension Mother    Stroke Mother    Healthy Sister    Hypertension Brother    Asthma  Brother    Hypertension Brother    Healthy Son    Healthy Daughter    Healthy Daughter     ALLERGIES:  is allergic to trulicity  [dulaglutide ].  MEDICATIONS:  Current Outpatient Medications  Medication Sig Dispense Refill   acetaminophen  (TYLENOL ) 500 MG tablet Take 1,000 mg by mouth every 6 (six) hours as needed for mild pain (pain score 1-3) or moderate pain (pain score 4-6).     albuterol  (VENTOLIN  HFA) 108 (90 Base) MCG/ACT inhaler Inhale 2 puffs into the lungs every 6 (six) hours as needed for wheezing or shortness of breath. 18 g 2   amitriptyline  (ELAVIL ) 10 MG tablet Take 10 mg by mouth at bedtime.     amLODipine  (NORVASC ) 10 MG tablet Take 1 tablet (10 mg total) by mouth daily. 30 tablet 0   ascorbic acid  (VITAMIN C ) 500 MG tablet Take 500 mg by mouth in the morning.     brimonidine  (ALPHAGAN ) 0.2 % ophthalmic solution Place 1 drop into the right eye 3 (three) times daily.     celecoxib  (CELEBREX ) 100 MG capsule Take 100 mg by mouth in the morning.     Cyanocobalamin  2500 MCG TABS Take 2,500 mcg by mouth daily. 90 tablet 0   ferrous sulfate  325 (65 FE) MG tablet Take 1 tablet (325 mg total) by mouth  daily with breakfast. 30 tablet 0   fluconazole  (DIFLUCAN ) 150 MG tablet Take 150 mg by mouth once.     fluticasone  (FLONASE ) 50 MCG/ACT nasal spray Place 2 sprays into both nostrils daily. (Patient taking differently: Place 1 spray into both nostrils daily as needed.) 16 g 0   furosemide  (LASIX ) 20 MG tablet TAKE 1 TABLET(20 MG) BY MOUTH IN THE MORNING 90 tablet 2   gabapentin  (NEURONTIN ) 800 MG tablet Take 800 mg by mouth 3 (three) times daily.     Galcanezumab -gnlm (EMGALITY ) 120 MG/ML SOAJ Inject 120 mg into the skin every 30 (thirty) days. 1.12 mL 11   Insulin  Disposable Pump (OMNIPOD 5 G7 PODS, GEN 5,) MISC 1.6 Units/hr by Implant route continuous. 1 each 0   insulin  lispro (HUMALOG ) 100 UNIT/ML injection Refill insulin  pump as directed 10 mL 3   latanoprost  (XALATAN ) 0.005 % ophthalmic solution Place 1 drop into the right eye at bedtime.     levofloxacin  (LEVAQUIN ) 750 MG tablet Take 750 mg by mouth daily.     metoprolol  tartrate (LOPRESSOR ) 25 MG tablet Take 1 tablet (25 mg total) by mouth 2 (two) times daily. 60 tablet 0   Multiple Vitamin (MULTIVITAMIN WITH MINERALS) TABS tablet Take 1 tablet by mouth daily.     oxyCODONE -acetaminophen  (PERCOCET) 7.5-325 MG tablet Take 1 tablet by mouth 4 (four) times daily as needed for moderate pain (pain score 4-6).     pantoprazole  (PROTONIX ) 40 MG tablet Take 1 tablet (40 mg total) by mouth daily. 30 tablet 0   prednisoLONE  acetate (PRED FORTE ) 1 % ophthalmic suspension Place 1 drop into the right eye in the morning, at noon, and at bedtime.     predniSONE  (DELTASONE ) 10 MG tablet Take 1 tablet (10 mg total) by mouth daily with breakfast. 30 tablet 0   Rimegepant Sulfate (NURTEC) 75 MG TBDP Take 1 tablet (75 mg total) by mouth as needed. Take 1 tablet at onset of headache, max is 1 tablet in 24 hours. 8 tablet 11   tiZANidine  (ZANAFLEX ) 4 MG tablet Take 4 mg by mouth every 12 (twelve) hours as needed for muscle spasms.  No current  facility-administered medications for this visit.     PHYSICAL EXAMINATION: ECOG PERFORMANCE STATUS: 2 - Symptomatic, <50% confined to bed  Vitals:   04/02/24 1121  BP: (!) 119/59  Pulse: 76  Resp: 17  Temp: 97.6 F (36.4 C)  SpO2: 100%   Filed Weights   04/02/24 1121  Weight: 227 lb (103 kg)    GENERAL:alert, no distress and comfortable, obese. She comes in a wheel chair  LYMPH:  no palpable lymphadenopathy in the cervical, axillary  LUNGS: clear to auscultation and percussion with normal breathing effort HEART: regular rate & rhythm and no murmurs and no lower extremity edema  LABORATORY DATA:  I have reviewed the data as listed Lab Results  Component Value Date   WBC 10.4 04/02/2024   HGB 8.5 (L) 04/02/2024   HCT 24.2 (L) 04/02/2024   MCV 84.3 04/02/2024   PLT 137 (L) 04/02/2024     Chemistry      Component Value Date/Time   NA 134 (L) 03/22/2024 0554   NA 137 07/23/2023 1200   K 5.1 03/22/2024 0554   CL 109 03/22/2024 0554   CO2 17 (L) 03/22/2024 0554   BUN 28 (H) 03/22/2024 0554   BUN 23 07/23/2023 1200   CREATININE 1.11 (H) 03/22/2024 0554   CREATININE 1.08 (H) 03/06/2024 0822      Component Value Date/Time   CALCIUM  8.2 (L) 03/22/2024 0554   ALKPHOS 82 03/06/2024 0822   AST 14 (L) 03/06/2024 0822   ALT 9 03/06/2024 0822   BILITOT 0.6 03/06/2024 9177       RADIOGRAPHIC STUDIES: I have personally reviewed the radiological images as listed and agreed with the findings in the report. ECHOCARDIOGRAM COMPLETE Result Date: 03/21/2024    ECHOCARDIOGRAM REPORT   Patient Name:   Melinda Stone [facility] Date of Exam: 03/21/2024 Medical Rec #:  [id number]              Height:       69.0 in Accession #:    [id number]             Weight:       231.9 lb Date of Birth:  05-Nov-1980              BSA:          2.200 m Patient Age:    43 years               BP:           184/92 mmHg Patient Gender: F                      HR:           80 bpm. Exam Location:  Inpatient  Procedure: 2D Echo, Cardiac Doppler and Color Doppler (Both Spectral and Color            Flow Doppler were utilized during procedure). Indications:    Elevated Troponin  History:        Patient has prior history of Echocardiogram examinations, most                 recent 05/13/2023. Risk Factors:Diabetes and Hypertension.  Sonographer:    Tinnie Gosling RDCS Referring Phys: [id number] RAVI PAHWANI IMPRESSIONS  1. Left ventricular ejection fraction, by estimation, is 60 to 65%. The left ventricle has normal function. The left ventricle has no regional wall motion abnormalities. There is mild concentric left ventricular hypertrophy. Left  ventricular diastolic parameters are consistent with Grade II diastolic dysfunction (pseudonormalization). Elevated left atrial pressure.  2. Right ventricular systolic function is normal. The right ventricular size is normal. Tricuspid regurgitation signal is inadequate for assessing PA pressure.  3. Left atrial size was mildly dilated.  4. The mitral valve is normal in structure. Trivial mitral valve regurgitation. No evidence of mitral stenosis.  5. The aortic valve is normal in structure. Aortic valve regurgitation is not visualized. No aortic stenosis is present.  6. The inferior vena cava is dilated in size with <50% respiratory variability, suggesting right atrial pressure of 15 mmHg. FINDINGS  Left Ventricle: Left ventricular ejection fraction, by estimation, is 60 to 65%. The left ventricle has normal function. The left ventricle has no regional wall motion abnormalities. The left ventricular internal cavity size was normal in size. There is  mild concentric left ventricular hypertrophy. Left ventricular diastolic parameters are consistent with Grade II diastolic dysfunction (pseudonormalization). Elevated left atrial pressure. Right Ventricle: The right ventricular size is normal. No increase in right ventricular wall thickness. Right ventricular systolic function is normal.  Tricuspid regurgitation signal is inadequate for assessing PA pressure. Left Atrium: Left atrial size was mildly dilated. Right Atrium: Right atrial size was normal in size. Pericardium: There is no evidence of pericardial effusion. Mitral Valve: The mitral valve is normal in structure. Trivial mitral valve regurgitation. No evidence of mitral valve stenosis. Tricuspid Valve: The tricuspid valve is normal in structure. Tricuspid valve regurgitation is trivial. No evidence of tricuspid stenosis. Aortic Valve: The aortic valve is normal in structure. Aortic valve regurgitation is not visualized. No aortic stenosis is present. Pulmonic Valve: The pulmonic valve was normal in structure. Pulmonic valve regurgitation is trivial. No evidence of pulmonic stenosis. Aorta: The aortic root is normal in size and structure. Venous: The inferior vena cava is dilated in size with less than 50% respiratory variability, suggesting right atrial pressure of 15 mmHg. IAS/Shunts: No atrial level shunt detected by color flow Doppler.  LEFT VENTRICLE PLAX 2D LVIDd:         4.30 cm      Diastology LVIDs:         2.80 cm      LV e' medial:    7.40 cm/s LV PW:         1.20 cm      LV E/e' medial:  14.7 LV IVS:        1.20 cm      LV e' lateral:   8.38 cm/s LVOT diam:     2.10 cm      LV E/e' lateral: 13.0 LV SV:         95 LV SV Index:   43 LVOT Area:     3.46 cm LV IVRT:       85 msec  LV Volumes (MOD) LV vol d, MOD A2C: 129.0 ml LV vol d, MOD A4C: 155.0 ml LV vol s, MOD A2C: 41.4 ml LV vol s, MOD A4C: 62.0 ml LV SV MOD A2C:     87.6 ml LV SV MOD A4C:     155.0 ml LV SV MOD BP:      93.3 ml RIGHT VENTRICLE             IVC RV S prime:     13.10 cm/s  IVC diam: 2.25 cm TAPSE (M-mode): 2.4 cm  PULMONARY VEINS                             Diastolic Velocity: 50.60 cm/s                             S/D Velocity:       1.20                             Systolic Velocity:  62.00 cm/s LEFT ATRIUM             Index         RIGHT ATRIUM           Index LA diam:        4.07 cm 1.85 cm/m   RA Area:     14.50 cm LA Vol (A2C):   67.6 ml 30.72 ml/m  RA Volume:   33.70 ml  15.32 ml/m LA Vol (A4C):   77.1 ml 35.04 ml/m LA Biplane Vol: 76.8 ml 34.91 ml/m  AORTIC VALVE LVOT Vmax:   115.00 cm/s LVOT Vmean:  79.800 cm/s LVOT VTI:    0.274 m  AORTA Ao Root diam: 2.82 cm Ao Asc diam:  3.31 cm MITRAL VALVE MV Area (PHT): 4.33 cm     SHUNTS MV Decel Time: 175 msec     Systemic VTI:  0.27 m MV E velocity: 109.00 cm/s  Systemic Diam: 2.10 cm MV A velocity: 144.00 cm/s MV E/A ratio:  0.76 Wilbert Bihari MD Electronically signed by Wilbert Bihari MD Signature Date/Time: 03/21/2024/4:08:10 PM    Final      All questions were answered. The patient knows to call the clinic with any problems, questions or concerns. I spent 15 minutes in the care of this patient including H and P, review of records, counseling and coordination of care.     Amber Stalls, MD 04/02/2024 11:22 AM

## 2024-04-07 ENCOUNTER — Ambulatory Visit: Payer: Self-pay | Admitting: Obstetrics and Gynecology

## 2024-04-15 ENCOUNTER — Inpatient Hospital Stay

## 2024-04-15 ENCOUNTER — Inpatient Hospital Stay: Admitting: Adult Health

## 2024-04-15 ENCOUNTER — Telehealth: Payer: Self-pay

## 2024-04-15 NOTE — Telephone Encounter (Signed)
 Patient reports lack of transportation as a barrier to attending appointments

## 2024-04-17 ENCOUNTER — Ambulatory Visit: Admitting: Nurse Practitioner

## 2024-04-21 ENCOUNTER — Ambulatory Visit: Admitting: Neurology

## 2024-04-22 ENCOUNTER — Ambulatory Visit: Admitting: Neurology

## 2024-04-22 ENCOUNTER — Encounter: Payer: Self-pay | Admitting: Neurology

## 2024-04-22 VITALS — BP 149/86 | Ht 69.0 in | Wt 229.0 lb

## 2024-04-22 DIAGNOSIS — G43109 Migraine with aura, not intractable, without status migrainosus: Secondary | ICD-10-CM

## 2024-04-22 DIAGNOSIS — M542 Cervicalgia: Secondary | ICD-10-CM | POA: Insufficient documentation

## 2024-04-22 DIAGNOSIS — Z79891 Long term (current) use of opiate analgesic: Secondary | ICD-10-CM | POA: Insufficient documentation

## 2024-04-22 MED ORDER — NURTEC 75 MG PO TBDP: 75.0000 mg | ORAL_TABLET | ORAL | 11 refills | Status: AC | PRN

## 2024-04-22 MED ORDER — EMGALITY 120 MG/ML ~~LOC~~ SOAJ: 120.0000 mg | SUBCUTANEOUS | 11 refills | Status: AC

## 2024-04-22 NOTE — Patient Instructions (Signed)
 Great to see you today! Continue current medications Reach out if migraines increase Follow-up in 1 year.  Thanks!!

## 2024-04-22 NOTE — Progress Notes (Signed)
 "  Patient: Melinda Stone Date of Birth: [birth year]  Reason for Visit: Follow up History from: Patient Primary Neurologist: Penumalli   ASSESSMENT AND PLAN 44 y.o. year old female   1.  Chronic migraine headache with aura  2.  Chronic pain syndrome 3.  Anemia  - Migraines are much improved! - Continue Emgality  120 mg monthly injection for migraine prevention - Continue Nurtec 75 mg as needed for acute migraine - Planning for hysterectomy, have advised do not recommend pregnancy until off injectable CGRP for 6 months - Previously tried and failed: Amitriptyline , Topamax , Maxalt , gabapentin , Percocet, tizanidine , Imitrex  - Next steps: Ajovy, Qulipta, Botox - Follow-up in 1 year or sooner if needed, VV  Meds ordered this encounter  Medications   Galcanezumab -gnlm (EMGALITY ) 120 MG/ML SOAJ    Sig: Inject 120 mg into the skin every 30 (thirty) days.    Dispense:  1.12 mL    Refill:  11   Rimegepant Sulfate (NURTEC) 75 MG TBDP    Sig: Take 1 tablet (75 mg total) by mouth as needed. Take 1 tablet at onset of headache, max is 1 tablet in 24 hours.    Dispense:  8 tablet    Refill:  11   HISTORY OF PRESENT ILLNESS: Today 04/22/24 04/22/24 SS: Admission in Jan for Hyperkalemia, Anemia. On Emgality , migraines are much better, now much less frequent, only headaches are around her menstrual cycle she has anemia , has to have transfusion around this time. Had ablation, considering hysterectomy. Will use Nurtec during menstrual cycle with good benefit. Pain management is filling her amitriptyline .   08/20/23 SS: Struggling with anemia, has been getting blood infusions/hospital admission, last HCG 8.0 08/08/23. She did the loading dose of Emgality , hard to say if difference, because had headaches when she is anemic. Hasn't had Emgality  since Feb, told on back order. Headaches get better with blood infusions. The Ubrelvy  made her nauseated, she went to sleep. Hard to say headache frequency,  maybe 3 times a week moderate-severe headache, left sided neck area, temples, makes her nauseated. Remains on amitriptyline  25 mg at bedtime. Takes Percocet daily for back pain. Had negative urine pregnancy test yesterday.   Update 01/31/23 SS: Never got Emgality  reports due to being out of stock. Migraines 3-4 a week. Tried Imitrex , was helpful but her vomit. Remains on amitritpyline 25 mg at bedtime, helps her sleep, unclear benefit to migraines?  Continues to see pain management for low back pain determine if any surgical options are available.  07/11/22 SS: Had MRI of the brain with and without contrast October 2023 was unremarkable. Amitriptyline  started last visit 25 mg at bedtime. No change in headaches, but it helps her sleep. Still having 2-3 a week. Are frontal, occipital. Migraine features. Takes excedrin  migraine and tylenol , doesn't help. Tried Maxalt  in the past without benefit. Chronic back pain on oxycodone . Hospitalized for PNA in Feb 2024. Supposed to have fusion in lower back, reports orthopedics won't operate due to low iron  levels, ESI caused more pain than help. Get a 2nd opinion, continue going to pain management. Trying for awhile to get disability, going through appeal.   HISTORY  UPDATE (06/20/21, VRP): Since last visit, now with migraine x 2 months. Similar HA in her 20's. Gen throbbing, nausea, sens to light and sound HA. Now 3-4 per week.   PRIOR HPI (11/07/20): 44 year old female with type 2 diabetes and chronic low back pain here for evaluation of lower extremity pain and numbness.  Patient has had diabetes since age 20 years old.  She has had lower extremity numbness and tingling for past 3 to 4 years.  She is also been diagnosed with lumbar spinal stenosis and degenerative changes, status post surgery in March 2022.  Follow-up postoperative imaging demonstrates residual spinal stenosis and radiculopathy changes and she is planning to have additional surgery in the next few  weeks.   Patient has been under care of pain management in the past.  She has tried gabapentin , Lyrica , Cymbalta , Robaxin , hydrocodone  and tramadol  in the past  REVIEW OF SYSTEMS: Out of a complete 14 system review of symptoms, the patient complains only of the following symptoms, and all other reviewed systems are negative.  See HPI  ALLERGIES: Allergies  Allergen Reactions   Trulicity  [Dulaglutide ] Diarrhea    HOME MEDICATIONS: Outpatient Medications Prior to Visit  Medication Sig Dispense Refill   acetaminophen  (TYLENOL ) 500 MG tablet Take 1,000 mg by mouth every 6 (six) hours as needed for mild pain (pain score 1-3) or moderate pain (pain score 4-6).     albuterol  (VENTOLIN  HFA) 108 (90 Base) MCG/ACT inhaler Inhale 2 puffs into the lungs every 6 (six) hours as needed for wheezing or shortness of breath. 18 g 2   amitriptyline  (ELAVIL ) 10 MG tablet Take 10 mg by mouth at bedtime.     amLODipine  (NORVASC ) 10 MG tablet Take 1 tablet (10 mg total) by mouth daily. 30 tablet 0   ascorbic acid  (VITAMIN C ) 500 MG tablet Take 500 mg by mouth in the morning.     brimonidine  (ALPHAGAN ) 0.2 % ophthalmic solution Place 1 drop into the right eye 3 (three) times daily.     celecoxib  (CELEBREX ) 100 MG capsule Take 100 mg by mouth in the morning.     Cyanocobalamin  2500 MCG TABS Take 2,500 mcg by mouth daily. 90 tablet 0   DULERA  100-5 MCG/ACT AERO Inhale 2 puffs into the lungs 2 (two) times daily.     ferrous sulfate  325 (65 FE) MG tablet Take 1 tablet (325 mg total) by mouth daily with breakfast. 30 tablet 0   fluconazole  (DIFLUCAN ) 150 MG tablet Take 150 mg by mouth once.     fluticasone  (FLONASE ) 50 MCG/ACT nasal spray Place 2 sprays into both nostrils daily. (Patient taking differently: Place 1 spray into both nostrils daily as needed.) 16 g 0   furosemide  (LASIX ) 20 MG tablet TAKE 1 TABLET(20 MG) BY MOUTH IN THE MORNING 90 tablet 2   gabapentin  (NEURONTIN ) 800 MG tablet Take 800 mg by mouth 3  (three) times daily.     Galcanezumab -gnlm (EMGALITY ) 120 MG/ML SOAJ Inject 120 mg into the skin every 30 (thirty) days. 1.12 mL 11   Insulin  Disposable Pump (OMNIPOD 5 G7 PODS, GEN 5,) MISC 1.6 Units/hr by Implant route continuous. 1 each 0   insulin  lispro (HUMALOG ) 100 UNIT/ML injection Refill insulin  pump as directed 10 mL 3   latanoprost  (XALATAN ) 0.005 % ophthalmic solution Place 1 drop into the right eye at bedtime.     levofloxacin  (LEVAQUIN ) 750 MG tablet Take 750 mg by mouth daily.     metoprolol  tartrate (LOPRESSOR ) 25 MG tablet Take 1 tablet (25 mg total) by mouth 2 (two) times daily. 60 tablet 0   Multiple Vitamin (MULTIVITAMIN WITH MINERALS) TABS tablet Take 1 tablet by mouth daily.     oxyCODONE -acetaminophen  (PERCOCET) 7.5-325 MG tablet Take 1 tablet by mouth 4 (four) times daily as needed for moderate pain (pain score  4-6).     pantoprazole  (PROTONIX ) 40 MG tablet Take 1 tablet (40 mg total) by mouth daily. 30 tablet 0   prednisoLONE  acetate (PRED FORTE ) 1 % ophthalmic suspension Place 1 drop into the right eye in the morning, at noon, and at bedtime.     predniSONE  (DELTASONE ) 5 MG tablet Take 1 tablet (5 mg total) by mouth daily with breakfast. 15 tablet 0   Rimegepant Sulfate (NURTEC) 75 MG TBDP Take 1 tablet (75 mg total) by mouth as needed. Take 1 tablet at onset of headache, max is 1 tablet in 24 hours. 8 tablet 11   tiZANidine  (ZANAFLEX ) 4 MG tablet Take 4 mg by mouth every 12 (twelve) hours as needed for muscle spasms.     No facility-administered medications prior to visit.    PAST MEDICAL HISTORY: Past Medical History:  Diagnosis Date   Anemia    Arthritis 08/17/2020   Chronic back pain    Diabetes mellitus without complication (HCC)    type 2   GERD (gastroesophageal reflux disease)    Hypertension    Migraine    Ovarian cyst    Pneumonia    Polyneuropathy     PAST SURGICAL HISTORY: Past Surgical History:  Procedure Laterality Date   CATARACT  EXTRACTION Right 11/08/2023   CESAREAN SECTION     x4   CHOLECYSTECTOMY  2004   EYE SURGERY Left    cataract   EYE SURGERY Left 10/2023   retinal surgery x2   HYSTEROSCOPY N/A 10/01/2023   Procedure: ABLATION, ENDOMETRIUM, HYSTEROSCOPIC;  Surgeon: Alger Gong, MD;  Location: [facility] OR;  Service: Gynecology;  Laterality: N/A;   IR BONE MARROW BIOPSY & ASPIRATION  08/01/2023   LUMBAR LAMINECTOMY N/A 05/30/2020   Procedure: Lumbar five Laminectomy,  Bilateral Microdiscectomy, Left Lumbar five -Sacral one Microdiscectomy;  Surgeon: Barbarann Oneil BROCKS, MD;  Location: [facility] OR;  Service: Orthopedics;  Laterality: N/A;   LUMBAR LAMINECTOMY Left 11/14/2020   Procedure: LEFT LUMBAR FOUR-FIVE MICRODISCECTOMY;  Surgeon: Barbarann Oneil BROCKS, MD;  Location: [facility] OR;  Service: Orthopedics;  Laterality: Left;   TUBAL LIGATION  10/19/2010    FAMILY HISTORY: Family History  Problem Relation Age of Onset   Diabetes Mother    Hypertension Mother    Stroke Mother    Healthy Sister    Hypertension Brother    Asthma Brother    Hypertension Brother    Healthy Son    Healthy Daughter    Healthy Daughter     SOCIAL HISTORY: Social History   Socioeconomic History   Marital status: Single    Spouse name: Not on file   Number of children: 3   Years of education: Not on file   Highest education level: High school graduate  Occupational History    Comment: home maker  Tobacco Use   Smoking status: Every Day    Types: Cigars    Last attempt to quit: 12/23/2021    Years since quitting: 2.3    Passive exposure: Never   Smokeless tobacco: Never  Vaping Use   Vaping status: Never Used  Substance and Sexual Activity   Alcohol  use: Not Currently   Drug use: Not Currently    Comment: per patient stopped smoking marijuana Mar 2022   Sexual activity: Yes    Birth control/protection: Surgical  Other Topics Concern   Not on file  Social History Narrative   Lives with 3 children   Some coffee   Right handed  Doesn't work   Social Drivers of Health   Tobacco Use: High Risk (04/22/2024)   Patient History    Smoking Tobacco Use: Every Day    Smokeless Tobacco Use: Never    Passive Exposure: Never  Financial Resource Strain: Not on file  Food Insecurity: No Food Insecurity (03/21/2024)   Epic    Worried About Programme Researcher, Broadcasting/film/video in the Last Year: Never true    Ran Out of Food in the Last Year: Never true  Transportation Needs: No Transportation Needs (03/21/2024)   Epic    Lack of Transportation (Medical): No    Lack of Transportation (Non-Medical): No  Physical Activity: Not on file  Stress: Not on file  Social Connections: Moderately Isolated (07/31/2023)   Social Connection and Isolation Panel    Frequency of Communication with Friends and Family: Twice a week    Frequency of Social Gatherings with Friends and Family: Twice a week    Attends Religious Services: 1 to 4 times per year    Active Member of [employer] or Organizations: No    Attends Banker Meetings: Never    Marital Status: Never married  Intimate Partner Violence: Not At Risk (03/21/2024)   Epic    Fear of Current or Ex-Partner: No    Emotionally Abused: No    Physically Abused: No    Sexually Abused: No  Depression (PHQ2-9): Low Risk (11/29/2023)   Depression (PHQ2-9)    PHQ-2 Score: 0  Alcohol  Screen: Not on file  Housing: Low Risk (03/21/2024)   Epic    Unable to Pay for Housing in the Last Year: No    Number of Times Moved in the Last Year: 0    Homeless in the Last Year: No  Utilities: Not At Risk (03/21/2024)   Epic    Threatened with loss of utilities: No  Health Literacy: Not on file   PHYSICAL EXAM  Vitals:   04/22/24 1003  BP: (!) 149/86  Weight: 229 lb (103.9 kg)  Height: 5' 9 (1.753 m)    Body mass index is 33.82 kg/m.  Generalized: Well developed, in no acute distress, disheveled, tired  Neurological examination  Mentation: Alert oriented to time, place, history taking. Follows all  commands speech and language fluent Cranial nerve II-XII: Pupils were equal round reactive to light. Extraocular movements were full, visual field were full on confrontational test. Facial sensation and strength were normal.  Head turning and shoulder shrug  were normal and symmetric. Motor: The motor testing reveals 5 over 5 strength of all extremities Sensory: Sensory testing is intact to soft touch on all 4 extremities. No evidence of extinction is noted.  Coordination: Cerebellar testing reveals good finger-nose-finger bilaterally Gait and station: Independent cautious   DIAGNOSTIC DATA (LABS, IMAGING, TESTING) - I reviewed patient records, labs, notes, testing and imaging myself where available.  Lab Results  Component Value Date   WBC 10.4 04/02/2024   HGB 8.5 (L) 04/02/2024   HCT 24.2 (L) 04/02/2024   MCV 84.3 04/02/2024   PLT 137 (L) 04/02/2024      Component Value Date/Time   NA 134 (L) 03/22/2024 0554   NA 137 07/23/2023 1200   K 5.1 03/22/2024 0554   CL 109 03/22/2024 0554   CO2 17 (L) 03/22/2024 0554   GLUCOSE 242 (H) 03/22/2024 0554   BUN 28 (H) 03/22/2024 0554   BUN 23 07/23/2023 1200   CREATININE 1.11 (H) 03/22/2024 0554   CREATININE 1.08 (H) 03/06/2024  9177   CALCIUM  8.2 (L) 03/22/2024 0554   PROT 6.1 (L) 03/06/2024 0822   PROT 6.3 04/28/2020 1124   ALBUMIN 3.7 03/06/2024 0822   ALBUMIN 4.0 04/28/2020 1124   AST 14 (L) 03/06/2024 0822   ALT 9 03/06/2024 0822   ALKPHOS 82 03/06/2024 0822   BILITOT 0.6 03/06/2024 0822   GFRNONAA >60 03/22/2024 0554   GFRNONAA >60 03/06/2024 0822   GFRAA 139 04/28/2020 1124   Lab Results  Component Value Date   CHOL 96 (L) 04/28/2020   HDL 55 04/28/2020   LDLCALC 23 04/28/2020   TRIG 138 05/16/2023   CHOLHDL 1.7 04/28/2020   Lab Results  Component Value Date   HGBA1C 5.9 (A) 04/01/2024   Lab Results  Component Value Date   VITAMINB12 646 12/24/2023   Lab Results  Component Value Date   TSH 0.655 05/11/2023     Lauraine Born, AGNP-C, DNP 04/22/2024, 10:09 AM [facility] [address], [address] [place], [place] [address] [phone]   "

## 2024-04-24 ENCOUNTER — Encounter (HOSPITAL_COMMUNITY): Payer: Self-pay

## 2024-04-24 ENCOUNTER — Telehealth: Payer: Self-pay

## 2024-04-24 ENCOUNTER — Inpatient Hospital Stay: Attending: Oncology

## 2024-04-24 ENCOUNTER — Encounter: Payer: Self-pay | Admitting: Pulmonary Disease

## 2024-04-24 ENCOUNTER — Emergency Department (HOSPITAL_COMMUNITY): Admission: EM | Admit: 2024-04-24 | Source: Home / Self Care

## 2024-04-24 ENCOUNTER — Inpatient Hospital Stay: Admitting: Hematology and Oncology

## 2024-04-24 ENCOUNTER — Other Ambulatory Visit: Payer: Self-pay

## 2024-04-24 ENCOUNTER — Ambulatory Visit: Admitting: Pulmonary Disease

## 2024-04-24 VITALS — BP 126/64 | HR 82 | Temp 98.0°F | Resp 17 | Ht 69.0 in | Wt 226.9 lb

## 2024-04-24 VITALS — BP 126/60 | HR 77 | Temp 97.9°F | Ht 69.0 in | Wt 227.0 lb

## 2024-04-24 DIAGNOSIS — R052 Subacute cough: Secondary | ICD-10-CM

## 2024-04-24 DIAGNOSIS — R058 Other specified cough: Secondary | ICD-10-CM

## 2024-04-24 DIAGNOSIS — D519 Vitamin B12 deficiency anemia, unspecified: Secondary | ICD-10-CM

## 2024-04-24 DIAGNOSIS — J329 Chronic sinusitis, unspecified: Secondary | ICD-10-CM

## 2024-04-24 DIAGNOSIS — D649 Anemia, unspecified: Secondary | ICD-10-CM

## 2024-04-24 DIAGNOSIS — J4520 Mild intermittent asthma, uncomplicated: Secondary | ICD-10-CM

## 2024-04-24 LAB — CBC WITH DIFFERENTIAL/PLATELET
Abs Immature Granulocytes: 0.09 10*3/uL — ABNORMAL HIGH (ref 0.00–0.07)
Abs Immature Granulocytes: 0.08 10*3/uL — ABNORMAL HIGH (ref 0.00–0.07)
Basophils Absolute: 0.1 10*3/uL (ref 0.0–0.1)
Basophils Absolute: 0.1 10*3/uL (ref 0.0–0.1)
Basophils Relative: 1 %
Basophils Relative: 1 %
Eosinophils Absolute: 0 10*3/uL (ref 0.0–0.5)
Eosinophils Absolute: 0 10*3/uL (ref 0.0–0.5)
Eosinophils Relative: 0 %
Eosinophils Relative: 0 %
HCT: 17.5 % — ABNORMAL LOW (ref 36.0–46.0)
HCT: 19 % — ABNORMAL LOW (ref 36.0–46.0)
Hemoglobin: 6.1 g/dL — CL (ref 12.0–15.0)
Hemoglobin: 6.5 g/dL — CL (ref 12.0–15.0)
Immature Granulocytes: 1 %
Immature Granulocytes: 1 %
Lymphocytes Relative: 11 %
Lymphocytes Relative: 23 %
Lymphs Abs: 1.2 10*3/uL (ref 0.7–4.0)
Lymphs Abs: 2.6 10*3/uL (ref 0.7–4.0)
MCH: 29.2 pg (ref 26.0–34.0)
MCH: 29.4 pg (ref 26.0–34.0)
MCHC: 34.9 g/dL (ref 30.0–36.0)
MCHC: 34.2 g/dL (ref 30.0–36.0)
MCV: 83.7 fL (ref 80.0–100.0)
MCV: 86 fL (ref 80.0–100.0)
Monocytes Absolute: 0.3 10*3/uL (ref 0.1–1.0)
Monocytes Absolute: 0.5 10*3/uL (ref 0.1–1.0)
Monocytes Relative: 3 %
Monocytes Relative: 5 %
Neutro Abs: 9.3 10*3/uL — ABNORMAL HIGH (ref 1.7–7.7)
Neutro Abs: 8.1 10*3/uL — ABNORMAL HIGH (ref 1.7–7.7)
Neutrophils Relative %: 84 %
Neutrophils Relative %: 70 %
Platelets: 150 10*3/uL (ref 150–400)
Platelets: 174 10*3/uL (ref 150–400)
RBC: 2.09 MIL/uL — ABNORMAL LOW (ref 3.87–5.11)
RBC: 2.21 MIL/uL — ABNORMAL LOW (ref 3.87–5.11)
RDW: 18.7 % — ABNORMAL HIGH (ref 11.5–15.5)
RDW: 18.6 % — ABNORMAL HIGH (ref 11.5–15.5)
WBC: 11 10*3/uL — ABNORMAL HIGH (ref 4.0–10.5)
WBC: 11.4 10*3/uL — ABNORMAL HIGH (ref 4.0–10.5)
nRBC: 0 % (ref 0.0–0.2)
nRBC: 0 % (ref 0.0–0.2)

## 2024-04-24 LAB — CMP (CANCER CENTER ONLY)
ALT: 6 U/L (ref 0–44)
AST: <10 U/L — ABNORMAL LOW (ref 15–41)
Albumin: 3.4 g/dL — ABNORMAL LOW (ref 3.5–5.0)
Alkaline Phosphatase: 85 U/L (ref 38–126)
Anion gap: 10 (ref 5–15)
BUN: 24 mg/dL — ABNORMAL HIGH (ref 6–20)
CO2: 17 mmol/L — ABNORMAL LOW (ref 22–32)
Calcium: 8.2 mg/dL — ABNORMAL LOW (ref 8.9–10.3)
Chloride: 110 mmol/L (ref 98–111)
Creatinine: 1.35 mg/dL — ABNORMAL HIGH (ref 0.44–1.00)
GFR, Estimated: 50 mL/min — ABNORMAL LOW
Glucose, Bld: 297 mg/dL — ABNORMAL HIGH (ref 70–99)
Potassium: 5.9 mmol/L — ABNORMAL HIGH (ref 3.5–5.1)
Sodium: 137 mmol/L (ref 135–145)
Total Bilirubin: 0.4 mg/dL (ref 0.0–1.2)
Total Protein: 5.6 g/dL — ABNORMAL LOW (ref 6.5–8.1)

## 2024-04-24 LAB — COMPREHENSIVE METABOLIC PANEL WITH GFR
ALT: 6 U/L (ref 0–44)
AST: 13 U/L — ABNORMAL LOW (ref 15–41)
Albumin: 3.2 g/dL — ABNORMAL LOW (ref 3.5–5.0)
Alkaline Phosphatase: 84 U/L (ref 38–126)
Anion gap: 8 (ref 5–15)
BUN: 24 mg/dL — ABNORMAL HIGH (ref 6–20)
CO2: 18 mmol/L — ABNORMAL LOW (ref 22–32)
Calcium: 8 mg/dL — ABNORMAL LOW (ref 8.9–10.3)
Chloride: 115 mmol/L — ABNORMAL HIGH (ref 98–111)
Creatinine, Ser: 1.46 mg/dL — ABNORMAL HIGH (ref 0.44–1.00)
GFR, Estimated: 46 mL/min — ABNORMAL LOW
Glucose, Bld: 129 mg/dL — ABNORMAL HIGH (ref 70–99)
Potassium: 5.3 mmol/L — ABNORMAL HIGH (ref 3.5–5.1)
Sodium: 141 mmol/L (ref 135–145)
Total Bilirubin: 0.4 mg/dL (ref 0.0–1.2)
Total Protein: 5.4 g/dL — ABNORMAL LOW (ref 6.5–8.1)

## 2024-04-24 LAB — RETICULOCYTES
Immature Retic Fract: 11.1 % (ref 2.3–15.9)
RBC.: 2.1 MIL/uL — ABNORMAL LOW (ref 3.87–5.11)
Retic Count, Absolute: 189.5 10*3/uL — ABNORMAL HIGH (ref 19.0–186.0)
Retic Ct Pct: 9 % — ABNORMAL HIGH (ref 0.4–3.1)

## 2024-04-24 LAB — TYPE AND SCREEN

## 2024-04-24 LAB — PROTIME-INR
INR: 0.9 (ref 0.8–1.2)
Prothrombin Time: 12.6 s (ref 11.4–15.2)

## 2024-04-24 LAB — NITRIC OXIDE: Nitric Oxide: 6

## 2024-04-24 LAB — HCG, SERUM, QUALITATIVE: Preg, Serum: NEGATIVE

## 2024-04-24 MED ORDER — FLUNISOLIDE 25 MCG/ACT (0.025%) NA SOLN: 2.0000 | Freq: Two times a day (BID) | NASAL | 2 refills | Status: AC

## 2024-04-24 NOTE — Telephone Encounter (Signed)
 CRITICAL VALUE STICKER  CRITICAL VALUE:Hgb 6.1  RECEIVER (on-site recipient of call):M. Rikayla Demmon, RN  DATE & TIME NOTIFIED: 04/24/24  12:20PM  MESSENGER (representative from lab):Elizabeth  MD NOTIFIED: [place]  TIME OF NOTIFICATION: 12:25  RESPONSE:  Dr Loretha aware per Earnie Che, RN

## 2024-04-24 NOTE — ED Triage Notes (Addendum)
 Pt c/o dizziness, lightheaded, SOB, generalized weaknessx2d. Pt states sent by hematologist for hemoglobin of 6.1.

## 2024-04-24 NOTE — ED Notes (Signed)
 Lab just called this pt hgb of 6.5   charge notified acuity increased

## 2024-04-24 NOTE — Patient Instructions (Signed)
 VISIT SUMMARY:  During your visit, we discussed your shortness of breath and cough. You have a history of asthma and are currently using Dulera  as a maintenance inhaler. We also addressed your dry cough that began in January, which may be due to postnasal drip.  YOUR PLAN:  -UPPER AIRWAY COUGH SYNDROME DUE TO POSTNASAL DRIP: This condition is suspected to be causing your dry cough, which sometimes produces clear sputum. It is likely triggered by postnasal drip. We have prescribed an intranasal steroid antihistamine combination nasal spray to help manage this condition. Please use the nasal spray as directed and follow up in two months.  -MILD INTERMITTENT ASTHMA: Your asthma appears to be well controlled with your current treatment. Asthma is a condition where your airways narrow and swell, producing extra mucus, which can make breathing difficult. Continue using your Dulera  inhaler as prescribed and ensure you have your emergency inhaler available for acute symptoms.  INSTRUCTIONS:  Please follow up in two months to reassess your condition and the effectiveness of the prescribed treatment.

## 2024-04-24 NOTE — Progress Notes (Signed)
 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  Symptomatic acute on chronic hemolytic anemia with hemoglobin 6.1 g/dL, refractory to prednisone , exacerbated by heavy menstrual bleeding. Heavy menstrual bleeding persists post-endometrial ablation, contributing to refractory anemia. Completed childbearing, open to definitive management. Offered outpt [facility], she wants to go to ED - Admit via emergency department for packed red blood cell transfusion. - Documented request for transfusion of two blood units in ER.  Heavy menstrual bleeding - Identified Dr. Winton Felt as gynecologist. - Contact Dr. Felt to discuss menstruation cessation options, including possible hysterectomy   Weight HISTORY OF PRESENTING ILLNESS:  Melinda Stone 44 y.o. female is here because of hemolytic anemia  This is followed by hematology over the past several years patient has been extensively evaluated for underlying hemolytic anemia. Very complex history, multiple hospitalization at times for blood loss anemia and some times for possible hemolysis. No evidence of nutritional deficiency She did have episodes of hemolysis in the past but not classic hemolysis and required blood transfusion, DAT neg, no cold agglutinin. She responded to steroids however she didn't tolerate them  well, hence discontinued. Previous bone marrow biopsy showed Slightly hypercellular bone marrow with erythroid hyperplasia, No viral cytopathic changes or significant dyspoiesis. Suspect secondary process. Normal cytogenetics, normal myeloid NGS  MPN testing negative. CT chest abdomen pelvis with no lymphoproliferative disease. She says she wanted a second opinion since she doesn't seem to know why she is anemic.  Interval History  History of Present Illness  Melinda Stone is a 44 year old female with acute on chronic hemolytic anemia secondary to refractory heavy menstrual bleeding who presents with symptomatic anemia.  She reports fatigue and malaise, which she attributes to anemia. She references a hemoglobin of 6.1 g/dL. She has a history of transfusion-associated hyperkalemia, previously requiring both oral and intravenous potassium-lowering therapy, and requests precautions during transfusion.  She is currently menstruating, though her period is ending. Despite prior endometrial ablation and multiple medical therapies, including a recent 15-day course of prednisone  5 mg daily, her heavy menstrual bleeding persists and remains refractory to intervention. Previous prednisone  courses were only effective until the onset of menses. She is followed by gynecology and has completed childbearing.  She denies abnormal bleeding from other sites, lower extremity edema, symptoms of volume overload, or other sources of blood loss.  Apr 02, 2024: Hematology follow-up for acute on chronic hemolytic anemia; stable hemoglobin at 8.5 g/dL without transfusion, reduced hemolysis on labs, patient tapering prednisone  (5 mg daily at visit), no current bleeding or anemia symptoms. Advised to continue prednisone  taper and obtain follow-up labs in two weeks. Rest of the pertinent 10 point ROS reviewed and neg  MEDICAL HISTORY:  Past Medical History:  Diagnosis Date   Anemia    Arthritis 08/17/2020    Chronic back pain    Diabetes mellitus without complication (HCC)    type 2   GERD (gastroesophageal  reflux disease)    Hypertension    Migraine    Ovarian cyst    Pneumonia    Polyneuropathy     SURGICAL HISTORY: Past Surgical History:  Procedure Laterality Date   CATARACT EXTRACTION Right 11/08/2023   CESAREAN SECTION     x4   CHOLECYSTECTOMY  2004   EYE SURGERY Left    cataract   EYE SURGERY Left 10/2023   retinal surgery x2   HYSTEROSCOPY N/A 10/01/2023   Procedure: ABLATION, ENDOMETRIUM, HYSTEROSCOPIC;  Surgeon: Alger Gong, MD;  Location: [facility] OR;  Service: Gynecology;  Laterality: N/A;   IR BONE MARROW BIOPSY & ASPIRATION  08/01/2023   LUMBAR LAMINECTOMY N/A 05/30/2020   Procedure: Lumbar five Laminectomy,  Bilateral Microdiscectomy, Left Lumbar five -Sacral one Microdiscectomy;  Surgeon: Barbarann Oneil BROCKS, MD;  Location: [facility] OR;  Service: Orthopedics;  Laterality: N/A;   LUMBAR LAMINECTOMY Left 11/14/2020   Procedure: LEFT LUMBAR FOUR-FIVE MICRODISCECTOMY;  Surgeon: Barbarann Oneil BROCKS, MD;  Location: [facility] OR;  Service: Orthopedics;  Laterality: Left;   TUBAL LIGATION  10/19/2010    SOCIAL HISTORY: Social History   Socioeconomic History   Marital status: Single    Spouse name: Not on file   Number of children: 3   Years of education: Not on file   Highest education level: High school graduate  Occupational History    Comment: home maker  Tobacco Use   Smoking status: Some Days    Types: Cigars    Passive exposure: Never   Smokeless tobacco: Never  Vaping Use   Vaping status: Never Used  Substance and Sexual Activity   Alcohol  use: Not Currently   Drug use: Not Currently    Comment: per patient stopped smoking marijuana Mar 2022   Sexual activity: Yes    Birth control/protection: Surgical  Other Topics Concern   Not on file  Social History Narrative   Lives with 3 children   Some coffee   Right handed   Doesn't work   Social Drivers of Health   Tobacco  Use: High Risk (04/24/2024)   Patient History    Smoking Tobacco Use: Some Days    Smokeless Tobacco Use: Never    Passive Exposure: Never  Financial Resource Strain: Not on file  Food Insecurity: No Food Insecurity (03/21/2024)   Epic    Worried About Programme Researcher, Broadcasting/film/video in the Last Year: Never true    Ran Out of Food in the Last Year: Never true  Transportation Needs: No Transportation Needs (03/21/2024)   Epic    Lack of Transportation (Medical): No    Lack of Transportation (Non-Medical): No  Physical Activity: Not on file  Stress: Not on file  Social Connections: Moderately Isolated (07/31/2023)   Social Connection and Isolation Panel    Frequency of Communication with Friends and Family: Twice a week    Frequency of Social Gatherings with Friends and Family: Twice a week    Attends Religious Services: 1 to 4 times per year    Active Member of [employer] or Organizations: No    Attends Banker Meetings: Never    Marital Status: Never married  Intimate Partner Violence: Not At Risk (03/21/2024)   Epic    Fear of Current or Ex-Partner: No    Emotionally Abused: No    Physically Abused: No    Sexually Abused: No  Depression (PHQ2-9): Low Risk (04/24/2024)   Depression (PHQ2-9)    PHQ-2 Score: 0  Alcohol  Screen: Not  on file  Housing: Low Risk (03/21/2024)   Epic    Unable to Pay for Housing in the Last Year: No    Number of Times Moved in the Last Year: 0    Homeless in the Last Year: No  Utilities: Not At Risk (03/21/2024)   Epic    Threatened with loss of utilities: No  Health Literacy: Not on file    FAMILY HISTORY: Family History  Problem Relation Age of Onset   Diabetes Mother    Hypertension Mother    Stroke Mother    Healthy Sister    Hypertension Brother    Asthma Brother    Hypertension Brother    Healthy Son    Healthy Daughter    Healthy Daughter     ALLERGIES:  is allergic to trulicity  [dulaglutide ].  MEDICATIONS:  Current Outpatient Medications   Medication Sig Dispense Refill   acetaminophen  (TYLENOL ) 500 MG tablet Take 1,000 mg by mouth every 6 (six) hours as needed for mild pain (pain score 1-3) or moderate pain (pain score 4-6).     albuterol  (VENTOLIN  HFA) 108 (90 Base) MCG/ACT inhaler Inhale 2 puffs into the lungs every 6 (six) hours as needed for wheezing or shortness of breath. 18 g 2   amitriptyline  (ELAVIL ) 10 MG tablet Take 10 mg by mouth at bedtime.     amLODipine  (NORVASC ) 10 MG tablet Take 1 tablet (10 mg total) by mouth daily. 30 tablet 0   ascorbic acid  (VITAMIN C ) 500 MG tablet Take 500 mg by mouth in the morning.     brimonidine  (ALPHAGAN ) 0.2 % ophthalmic solution Place 1 drop into the right eye 3 (three) times daily.     celecoxib  (CELEBREX ) 100 MG capsule Take 100 mg by mouth in the morning.     Cyanocobalamin  2500 MCG TABS Take 2,500 mcg by mouth daily. 90 tablet 0   DULERA  100-5 MCG/ACT AERO Inhale 2 puffs into the lungs 2 (two) times daily.     ferrous sulfate  325 (65 FE) MG tablet Take 1 tablet (325 mg total) by mouth daily with breakfast. 30 tablet 0   fluconazole  (DIFLUCAN ) 150 MG tablet Take 150 mg by mouth once.     flunisolide  (NASALIDE ) 25 MCG/ACT (0.025%) SOLN Place 2 sprays into the nose 2 (two) times daily. 25 mL 2   furosemide  (LASIX ) 20 MG tablet TAKE 1 TABLET(20 MG) BY MOUTH IN THE MORNING 90 tablet 2   gabapentin  (NEURONTIN ) 800 MG tablet Take 800 mg by mouth 3 (three) times daily.     Galcanezumab -gnlm (EMGALITY ) 120 MG/ML SOAJ Inject 120 mg into the skin every 30 (thirty) days. 1.12 mL 11   Insulin  Disposable Pump (OMNIPOD 5 G7 PODS, GEN 5,) MISC 1.6 Units/hr by Implant route continuous. 1 each 0   insulin  lispro (HUMALOG ) 100 UNIT/ML injection Refill insulin  pump as directed 10 mL 3   latanoprost  (XALATAN ) 0.005 % ophthalmic solution Place 1 drop into the right eye at bedtime.     levofloxacin  (LEVAQUIN ) 750 MG tablet Take 750 mg by mouth daily.     metoprolol  tartrate (LOPRESSOR ) 25 MG tablet  Take 1 tablet (25 mg total) by mouth 2 (two) times daily. 60 tablet 0   Multiple Vitamin (MULTIVITAMIN WITH MINERALS) TABS tablet Take 1 tablet by mouth daily.     oxyCODONE -acetaminophen  (PERCOCET) 7.5-325 MG tablet Take 1 tablet by mouth 4 (four) times daily as needed for moderate pain (pain score 4-6).     pantoprazole  (PROTONIX ) 40  MG tablet Take 1 tablet (40 mg total) by mouth daily. 30 tablet 0   prednisoLONE  acetate (PRED FORTE ) 1 % ophthalmic suspension Place 1 drop into the right eye in the morning, at noon, and at bedtime.     predniSONE  (DELTASONE ) 5 MG tablet Take 1 tablet (5 mg total) by mouth daily with breakfast. 15 tablet 0   Rimegepant Sulfate (NURTEC) 75 MG TBDP Take 1 tablet (75 mg total) by mouth as needed. Take 1 tablet at onset of headache, max is 1 tablet in 24 hours. 8 tablet 11   tiZANidine  (ZANAFLEX ) 4 MG tablet Take 4 mg by mouth every 12 (twelve) hours as needed for muscle spasms.     No current facility-administered medications for this visit.     PHYSICAL EXAMINATION: ECOG PERFORMANCE STATUS: 2 - Symptomatic, <50% confined to bed  Vitals:   04/24/24 1214  BP: 126/64  Pulse: 82  Resp: 17  Temp: 98 F (36.7 C)  SpO2: 100%   Filed Weights   04/24/24 1214  Weight: 226 lb 14.4 oz (102.9 kg)    GENERAL:alert, no distress and comfortable, obese. She comes in a wheel chair  LYMPH:  no palpable lymphadenopathy in the cervical, axillary  LUNGS: clear to auscultation and percussion with normal breathing effort HEART: regular rate & rhythm and no murmurs and no lower extremity edema  LABORATORY DATA:  I have reviewed the data as listed Lab Results  Component Value Date   WBC 11.0 (H) 04/24/2024   HGB 6.1 (LL) 04/24/2024   HCT 17.5 (L) 04/24/2024   MCV 83.7 04/24/2024   PLT 150 04/24/2024     Chemistry      Component Value Date/Time   NA 137 04/24/2024 1158   NA 137 07/23/2023 1200   K 5.9 (H) 04/24/2024 1158   CL 110 04/24/2024 1158   CO2 17 (L)  04/24/2024 1158   BUN 24 (H) 04/24/2024 1158   BUN 23 07/23/2023 1200   CREATININE 1.35 (H) 04/24/2024 1158      Component Value Date/Time   CALCIUM  8.2 (L) 04/24/2024 1158   ALKPHOS 85 04/24/2024 1158   AST <10 (L) 04/24/2024 1158   ALT 6 04/24/2024 1158   BILITOT 0.4 04/24/2024 1158       RADIOGRAPHIC STUDIES: I have personally reviewed the radiological images as listed and agreed with the findings in the report. No results found.    All questions were answered. The patient knows to call the clinic with any problems, questions or concerns. I spent 20 minutes in the care of this patient including H and P, review of records, counseling and coordination of care.     Amber Stalls, MD 04/24/2024 1:16 PM

## 2024-04-24 NOTE — Progress Notes (Unsigned)
 "  Subjective:    Patient ID: Melinda Stone, female    DOB: [birth year], 44 y.o.   MRN: [id number]  Patient Care Team: [name], NP as PCP - General (Nurse Practitioner) Michele Richardson, DO as PCP - Cardiology (Cardiology) Babara Call, MD as Consulting Physician (Oncology)  Chief Complaint  Patient presents with   Asthma    Shortness of breath on exertion. Dry cough x 2 weeks.     BACKGROUND:   HPI  DATA 08/30/2021 PFTs: FEV1 2.35 L or 77% predicted, FVC 3.02 L or 82% predicted, FEV1/FVC 78%, lung volumes mildly reduced, diffusion capacity moderately reduced but corrects for alveolar volume. 09/08/2021 echocardiogram: LVEF 60 to 65%, mild LVH, grade 1 DD, mild to moderate tricuspid regurgitation. 09/25/2021 overnight oximetry: Saturations as low as 79% however there were significant swings and variation that a formal sleep test is recommended.   10/24/2021 Home sleep test: No evidence of sleep apnea.  AHI 0 with SpO2 low of 91%.  No need for supplemental oxygen noted. 12/27/2021 high-resolution chest CT: No evidence of interstitial lung disease, no active pulmonary disease, aberrant right subclavian artery.  Mild thymic hyperplasia, stable.  Review of Systems A 10 point review of systems was performed and it is as noted above otherwise negative.   Past Medical History:  Diagnosis Date   Anemia    Arthritis 08/17/2020   Chronic back pain    Diabetes mellitus without complication (HCC)    type 2   GERD (gastroesophageal reflux disease)    Hypertension    Migraine    Ovarian cyst    Pneumonia    Polyneuropathy     Past Surgical History:  Procedure Laterality Date   CATARACT EXTRACTION Right 11/08/2023   CESAREAN SECTION     x4   CHOLECYSTECTOMY  2004   EYE SURGERY Left    cataract   EYE SURGERY Left 10/2023   retinal surgery x2   HYSTEROSCOPY N/A 10/01/2023   Procedure: ABLATION, ENDOMETRIUM, HYSTEROSCOPIC;  Surgeon: Alger Gong, MD;  Location: [facility] OR;   Service: Gynecology;  Laterality: N/A;   IR BONE MARROW BIOPSY & ASPIRATION  08/01/2023   LUMBAR LAMINECTOMY N/A 05/30/2020   Procedure: Lumbar five Laminectomy,  Bilateral Microdiscectomy, Left Lumbar five -Sacral one Microdiscectomy;  Surgeon: Barbarann Oneil BROCKS, MD;  Location: [facility] OR;  Service: Orthopedics;  Laterality: N/A;   LUMBAR LAMINECTOMY Left 11/14/2020   Procedure: LEFT LUMBAR FOUR-FIVE MICRODISCECTOMY;  Surgeon: Barbarann Oneil BROCKS, MD;  Location: [facility] OR;  Service: Orthopedics;  Laterality: Left;   TUBAL LIGATION  10/19/2010    Patient Active Problem List   Diagnosis Date Noted   Long term (current) use of opiate analgesic 04/22/2024   Neck pain 04/22/2024   ABLA (acute blood loss anemia) 03/20/2024   Chronic kidney disease (CKD), stage IV (severe) (HCC) 02/28/2024   Hyperkalemia 02/27/2024   Neovascular secondary angle closure glaucoma, 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   Lymphadenopathy 12/05/2023   Chronic kidney disease, stage 3a (HCC) 11/24/2023   Acute on chronic anemia 11/23/2023   Rhinovirus 11/23/2023   ESR raised 11/05/2023   Leukocytosis 10/29/2023   Tension headache 10/29/2023   Moderate persistent asthma 10/29/2023   Acute sinusitis 10/28/2023   Dysfunctional uterine bleeding 10/01/2023   AKI (acute kidney injury) 09/05/2023   Urinary tract infection symptoms 09/05/2023   Thrombocytopenia 08/01/2023   Anemia of chronic disease 07/31/2023   Metabolic acidosis 07/31/2023   Menorrhagia 07/31/2023   Prolapsed lumbosacral intervertebral disc 07/31/2023   Essential hypertension  07/31/2023   Anxiety 07/31/2023   DM2 (diabetes mellitus, type 2) (HCC) 07/31/2023   Chlamydia 07/31/2023   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, with long-term current use of insulin  (HCC) 06/21/2023   Hepatosplenomegaly 06/14/2023   Debility 05/21/2023   Type 2 diabetes mellitus with hypoglycemia without coma (HCC) 05/21/2023   Hyperglycemia 05/15/2023   Nausea and vomiting 05/11/2023    Sepsis (HCC) 03/26/2023   Cellulitis of fifth toe 03/26/2023   DKA (diabetic ketoacidosis) (HCC) 12/24/2022   DKA, type 2 (HCC) 12/24/2022   Migraine 07/11/2022   Hypokalemia 05/14/2022   Normocytic anemia 05/14/2022   Grade I diastolic dysfunction 05/13/2022   Carpal tunnel syndrome 05/13/2022   Pain of breast 05/13/2022   Contusion of upper limb 05/13/2022   Obesity, class 1 05/13/2022   Overweight (BMI 25.0-29.9) 05/13/2022   Need for prophylactic vaccination and inoculation against influenza 02/07/2022   Low serum vitamin B12 01/30/2022   Iron  deficiency anemia due to chronic blood loss 01/30/2022   Esophageal reflux 01/15/2022   Dyspnea 09/01/2021   Lung nodule 09/01/2021   Absolute anemia 09/01/2021   Tobacco use 09/01/2021   Hypotension 09/01/2021   Nausea & vomiting 06/08/2021   UTI (urinary tract infection) 06/08/2021   Post laminectomy syndrome 04/11/2021   HNP (herniated nucleus pulposus), lumbar 11/14/2020   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   Chronic pain syndrome 06/14/2020   Pharmacologic therapy 06/14/2020   Disorder of skeletal system 06/14/2020   Problems influencing health status 06/14/2020   History of marijuana use 06/14/2020   History of illicit drug use 06/14/2020   Abnormal drug screen (05/25/2020) 06/14/2020   Marijuana use 06/14/2020   Abnormal MRI, lumbar spine (05/16/2020) 06/14/2020   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, L5-S1) 06/14/2020   Lumbar lateral recess stenosis (Left: L4-5, L5-S1) 06/14/2020   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) 06/14/2020   S/P lumbar microdiscectomy 06/07/2020   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   GI bleed 12/01/2019   Muscle spasm 11/06/2019   Sciatica 11/06/2019   Low back pain 02/04/2019   Type 2 diabetes mellitus with diabetic polyneuropathy, without long-term current use of insulin  (HCC) 12/03/2018   Type 2 diabetes mellitus with hyperglycemia, without  long-term current use of insulin  (HCC) 05/15/2018    Family History  Problem Relation Age of Onset   Diabetes Mother    Hypertension Mother    Stroke Mother    Healthy Sister    Hypertension Brother    Asthma Brother    Hypertension Brother    Healthy Son    Healthy Daughter    Healthy Daughter     Social History   Tobacco Use   Smoking status: Some Days    Types: Cigars    Passive exposure: Never   Smokeless tobacco: Never  Substance Use Topics   Alcohol  use: Not Currently    Allergies[1]  Active Medications[2]  Immunization History  Administered Date(s) Administered   Influenza, Seasonal, Injecte, Preservative Fre 02/29/2024   Influenza-Unspecified 12/18/2022   Janssen (J&J) SARS-COV-2 Vaccination 08/25/2019   Pneumococcal Polysaccharide-23 05/09/2020   Tdap 08/04/2022        Objective:     Vitals:   04/24/24 0846  BP: 126/60  Pulse: 77  Temp: 97.9 F (36.6 C)  Height: 5' 9 (1.753 m)  Weight: 227 lb (103 kg)  SpO2: 100%  TempSrc: Temporal  BMI (Calculated): 33.51     SpO2: 100 %  GENERAL: Well-developed overweight  woman, no acute distress, no conversational dyspnea.  Presents in wheelchair. HEAD: Normocephalic, atraumatic.  EYES: Pupils equal, round, reactive to light.  No scleral icterus.  MOUTH: Edentulous, oral mucosa moist.  No thrush. NECK: Supple. No thyromegaly. Trachea midline. No JVD.  No adenopathy. PULMONARY: Good air entry bilaterally.  No adventitious sounds noted. CARDIOVASCULAR: S1 and S2.  Regular rate and rhythm.  No rubs murmurs gallops heard. ABDOMEN: Benign. MUSCULOSKELETAL: No joint deformity, no clubbing, no edema.  Sarcopenia particularly in the lower extremities. NEUROLOGIC: No overt focal deficit, no gait disturbance, speech is fluent. SKIN: Intact,warm,dry. PSYCH: Flat affect.         Assessment & Plan:   No diagnosis found.  No orders of the defined types were placed in this encounter.   No orders of  the defined types were placed in this encounter.    Advised if symptoms do not improve or worsen, to please contact office for sooner follow up or seek emergency care.    I spent xxx minutes of dedicated to the care of this patient on the date of this encounter to include pre-visit review of records, face-to-face time with the patient discussing conditions above, post visit ordering of testing, clinical documentation with the electronic health record, making appropriate referrals as documented, and communicating necessary findings to members of the patients care team.   C. Leita Sanders, MD Advanced Bronchoscopy PCCM [facility]    *This note was dictated using voice recognition software/Dragon.  Despite best efforts to proofread, errors can occur which can change the meaning. Any transcriptional errors that result from this process are unintentional and may not be fully corrected at the time of dictation.     [1]  Allergies Allergen Reactions   Trulicity  [Dulaglutide ] Diarrhea  [2]  Current Meds  Medication Sig   acetaminophen  (TYLENOL ) 500 MG tablet Take 1,000 mg by mouth every 6 (six) hours as needed for mild pain (pain score 1-3) or moderate pain (pain score 4-6).   albuterol  (VENTOLIN  HFA) 108 (90 Base) MCG/ACT inhaler Inhale 2 puffs into the lungs every 6 (six) hours as needed for wheezing or shortness of breath.   amitriptyline  (ELAVIL ) 10 MG tablet Take 10 mg by mouth at bedtime.   amLODipine  (NORVASC ) 10 MG tablet Take 1 tablet (10 mg total) by mouth daily.   ascorbic acid  (VITAMIN C ) 500 MG tablet Take 500 mg by mouth in the morning.   brimonidine  (ALPHAGAN ) 0.2 % ophthalmic solution Place 1 drop into the right eye 3 (three) times daily.   celecoxib  (CELEBREX ) 100 MG capsule Take 100 mg by mouth in the morning.   Cyanocobalamin  2500 MCG TABS Take 2,500 mcg by mouth daily.   DULERA  100-5 MCG/ACT AERO Inhale 2 puffs into the lungs 2 (two) times daily.    ferrous sulfate  325 (65 FE) MG tablet Take 1 tablet (325 mg total) by mouth daily with breakfast.   fluconazole  (DIFLUCAN ) 150 MG tablet Take 150 mg by mouth once.   fluticasone  (FLONASE ) 50 MCG/ACT nasal spray Place 2 sprays into both nostrils daily. (Patient taking differently: Place 1 spray into both nostrils daily as needed.)   furosemide  (LASIX ) 20 MG tablet TAKE 1 TABLET(20 MG) BY MOUTH IN THE MORNING   gabapentin  (NEURONTIN ) 800 MG tablet Take 800 mg by mouth 3 (three) times daily.   Galcanezumab -gnlm (EMGALITY ) 120 MG/ML SOAJ Inject 120 mg into the skin every 30 (thirty) days.   Insulin  Disposable Pump (OMNIPOD 5 G7 PODS, GEN 5,) MISC  1.6 Units/hr by Implant route continuous.   insulin  lispro (HUMALOG ) 100 UNIT/ML injection Refill insulin  pump as directed   latanoprost  (XALATAN ) 0.005 % ophthalmic solution Place 1 drop into the right eye at bedtime.   levofloxacin  (LEVAQUIN ) 750 MG tablet Take 750 mg by mouth daily.   metoprolol  tartrate (LOPRESSOR ) 25 MG tablet Take 1 tablet (25 mg total) by mouth 2 (two) times daily.   Multiple Vitamin (MULTIVITAMIN WITH MINERALS) TABS tablet Take 1 tablet by mouth daily.   oxyCODONE -acetaminophen  (PERCOCET) 7.5-325 MG tablet Take 1 tablet by mouth 4 (four) times daily as needed for moderate pain (pain score 4-6).   pantoprazole  (PROTONIX ) 40 MG tablet Take 1 tablet (40 mg total) by mouth daily.   prednisoLONE  acetate (PRED FORTE ) 1 % ophthalmic suspension Place 1 drop into the right eye in the morning, at noon, and at bedtime.   predniSONE  (DELTASONE ) 5 MG tablet Take 1 tablet (5 mg total) by mouth daily with breakfast.   Rimegepant Sulfate (NURTEC) 75 MG TBDP Take 1 tablet (75 mg total) by mouth as needed. Take 1 tablet at onset of headache, max is 1 tablet in 24 hours.   tiZANidine  (ZANAFLEX ) 4 MG tablet Take 4 mg by mouth every 12 (twelve) hours as needed for muscle spasms.   "

## 2024-05-08 ENCOUNTER — Inpatient Hospital Stay: Admitting: Hematology and Oncology

## 2024-05-08 ENCOUNTER — Inpatient Hospital Stay

## 2024-06-25 ENCOUNTER — Ambulatory Visit: Admitting: Pulmonary Disease

## 2024-07-28 ENCOUNTER — Ambulatory Visit: Admitting: Internal Medicine

## 2025-04-22 ENCOUNTER — Ambulatory Visit: Admitting: Neurology
